# Patient Record
Sex: Male | Born: 1955 | Race: White | Hispanic: No | Smoking: Former smoker
Health system: Southern US, Community
[De-identification: ages and names within clinical notes are randomized; demographics above are authoritative.]

## PROBLEM LIST (undated history)

## (undated) DIAGNOSIS — F419 Anxiety disorder, unspecified: Secondary | ICD-10-CM

## (undated) DIAGNOSIS — R51 Headache: Secondary | ICD-10-CM

## (undated) DIAGNOSIS — C679 Malignant neoplasm of bladder, unspecified: Secondary | ICD-10-CM

## (undated) DIAGNOSIS — G629 Polyneuropathy, unspecified: Secondary | ICD-10-CM

## (undated) DIAGNOSIS — R7303 Prediabetes: Secondary | ICD-10-CM

## (undated) DIAGNOSIS — J302 Other seasonal allergic rhinitis: Secondary | ICD-10-CM

## (undated) DIAGNOSIS — M1990 Unspecified osteoarthritis, unspecified site: Secondary | ICD-10-CM

## (undated) DIAGNOSIS — I10 Essential (primary) hypertension: Secondary | ICD-10-CM

## (undated) DIAGNOSIS — E782 Mixed hyperlipidemia: Secondary | ICD-10-CM

## (undated) DIAGNOSIS — I2510 Atherosclerotic heart disease of native coronary artery without angina pectoris: Secondary | ICD-10-CM

## (undated) DIAGNOSIS — E669 Obesity, unspecified: Secondary | ICD-10-CM

## (undated) DIAGNOSIS — G4730 Sleep apnea, unspecified: Secondary | ICD-10-CM

## (undated) DIAGNOSIS — F32A Depression, unspecified: Secondary | ICD-10-CM

## (undated) DIAGNOSIS — C787 Secondary malignant neoplasm of liver and intrahepatic bile duct: Secondary | ICD-10-CM

## (undated) DIAGNOSIS — R739 Hyperglycemia, unspecified: Secondary | ICD-10-CM

## (undated) HISTORY — PX: JOINT REPLACEMENT: SHX530

## (undated) HISTORY — DX: Atherosclerotic heart disease of native coronary artery without angina pectoris: I25.10

## (undated) HISTORY — DX: Essential (primary) hypertension: I10

## (undated) HISTORY — DX: Polyneuropathy, unspecified: G62.9

---

## 2009-09-11 DIAGNOSIS — C679 Malignant neoplasm of bladder, unspecified: Secondary | ICD-10-CM

## 2009-09-11 HISTORY — DX: Malignant neoplasm of bladder, unspecified: C67.9

## 2009-09-11 HISTORY — PX: OTHER SURGICAL HISTORY: SHX169

## 2010-04-05 ENCOUNTER — Ambulatory Visit (HOSPITAL_COMMUNITY): Admission: RE | Admit: 2010-04-05 | Discharge: 2010-04-05 | Payer: Self-pay | Admitting: Family Medicine

## 2010-04-07 ENCOUNTER — Ambulatory Visit (HOSPITAL_COMMUNITY): Admission: RE | Admit: 2010-04-07 | Discharge: 2010-04-07 | Payer: Self-pay | Admitting: Urology

## 2010-07-13 ENCOUNTER — Ambulatory Visit (HOSPITAL_COMMUNITY): Admission: RE | Admit: 2010-07-13 | Discharge: 2010-07-13 | Payer: Self-pay | Admitting: Family Medicine

## 2010-08-09 ENCOUNTER — Ambulatory Visit (HOSPITAL_COMMUNITY): Admission: RE | Admit: 2010-08-09 | Discharge: 2010-08-09 | Payer: Self-pay | Admitting: Family Medicine

## 2010-08-18 ENCOUNTER — Ambulatory Visit: Payer: Self-pay | Admitting: Internal Medicine

## 2010-11-26 LAB — CBC
MCH: 32.6 pg (ref 26.0–34.0)
Platelets: 288 10*3/uL (ref 150–400)
RBC: 4.54 MIL/uL (ref 4.22–5.81)
RDW: 12.6 % (ref 11.5–15.5)

## 2010-11-26 LAB — BASIC METABOLIC PANEL
BUN: 16 mg/dL (ref 6–23)
CO2: 25 mEq/L (ref 19–32)
Calcium: 9.1 mg/dL (ref 8.4–10.5)
Creatinine, Ser: 0.72 mg/dL (ref 0.4–1.5)
GFR calc Af Amer: >60 mL/min (ref >60)

## 2010-11-26 LAB — SURGICAL PCR SCREEN: MRSA, PCR: NEGATIVE

## 2011-05-18 ENCOUNTER — Encounter (INDEPENDENT_AMBULATORY_CARE_PROVIDER_SITE_OTHER): Payer: Self-pay | Admitting: *Deleted

## 2011-06-27 ENCOUNTER — Other Ambulatory Visit (INDEPENDENT_AMBULATORY_CARE_PROVIDER_SITE_OTHER): Payer: Self-pay | Admitting: *Deleted

## 2011-06-27 DIAGNOSIS — Z1211 Encounter for screening for malignant neoplasm of colon: Secondary | ICD-10-CM

## 2011-06-27 MED ORDER — SODIUM CHLORIDE 0.45 % IV SOLN
Freq: Once | INTRAVENOUS | Status: AC
  Administered 2011-06-28: 11:00:00 via INTRAVENOUS

## 2011-06-28 ENCOUNTER — Other Ambulatory Visit (INDEPENDENT_AMBULATORY_CARE_PROVIDER_SITE_OTHER): Payer: Self-pay | Admitting: Internal Medicine

## 2011-06-28 ENCOUNTER — Ambulatory Visit (HOSPITAL_COMMUNITY)
Admission: RE | Admit: 2011-06-28 | Discharge: 2011-06-28 | Disposition: A | Discharge status: Home / Self Care | Payer: BC Managed Care – PPO | Source: Ambulatory Visit | Attending: Internal Medicine | Admitting: Internal Medicine

## 2011-06-28 ENCOUNTER — Encounter (INDEPENDENT_AMBULATORY_CARE_PROVIDER_SITE_OTHER): Payer: Self-pay | Admitting: Internal Medicine

## 2011-06-28 ENCOUNTER — Encounter (HOSPITAL_COMMUNITY): Payer: Self-pay | Admitting: *Deleted

## 2011-06-28 ENCOUNTER — Encounter (HOSPITAL_COMMUNITY)
Admission: RE | Disposition: A | Discharge status: Home / Self Care | Payer: Self-pay | Source: Ambulatory Visit | Attending: Internal Medicine

## 2011-06-28 DIAGNOSIS — Z7982 Long term (current) use of aspirin: Secondary | ICD-10-CM | POA: Insufficient documentation

## 2011-06-28 DIAGNOSIS — Z79899 Other long term (current) drug therapy: Secondary | ICD-10-CM | POA: Insufficient documentation

## 2011-06-28 DIAGNOSIS — D126 Benign neoplasm of colon, unspecified: Secondary | ICD-10-CM | POA: Insufficient documentation

## 2011-06-28 DIAGNOSIS — Z1211 Encounter for screening for malignant neoplasm of colon: Secondary | ICD-10-CM

## 2011-06-28 DIAGNOSIS — K5730 Diverticulosis of large intestine without perforation or abscess without bleeding: Secondary | ICD-10-CM | POA: Insufficient documentation

## 2011-06-28 DIAGNOSIS — I10 Essential (primary) hypertension: Secondary | ICD-10-CM | POA: Insufficient documentation

## 2011-06-28 HISTORY — DX: Headache: R51

## 2011-06-28 HISTORY — PX: COLONOSCOPY: SHX5424

## 2011-06-28 HISTORY — DX: Anxiety disorder, unspecified: F41.9

## 2011-06-28 HISTORY — DX: Other seasonal allergic rhinitis: J30.2

## 2011-06-28 SURGERY — COLONOSCOPY
Anesthesia: Moderate Sedation

## 2011-06-28 MED ORDER — MIDAZOLAM HCL 5 MG/5ML IJ SOLN
INTRAMUSCULAR | Status: AC
  Filled 2011-06-28: qty 5

## 2011-06-28 MED ORDER — MEPERIDINE HCL 50 MG/ML IJ SOLN
INTRAMUSCULAR | Status: DC | PRN
  Administered 2011-06-28 (×2): 25 mg via INTRAVENOUS

## 2011-06-28 MED ORDER — MIDAZOLAM HCL 5 MG/5ML IJ SOLN
INTRAMUSCULAR | Status: AC
  Filled 2011-06-28: qty 10

## 2011-06-28 MED ORDER — MIDAZOLAM HCL 5 MG/5ML IJ SOLN
INTRAMUSCULAR | Status: DC | PRN
  Administered 2011-06-28 (×4): 2 mg via INTRAVENOUS
  Administered 2011-06-28: 3 mg via INTRAVENOUS

## 2011-06-28 MED ORDER — MEPERIDINE HCL 50 MG/ML IJ SOLN
INTRAMUSCULAR | Status: AC
  Filled 2011-06-28: qty 1

## 2011-06-28 NOTE — Op Note (Signed)
COLONOSCOPY PROCEDURE REPORT  PATIENT:  Marvin Carr  MR#:  [id number] Birthdate:  1956/04/14, 55 y.o., male Endoscopist:  Dr. Malissa Hippo, MD Referred By:  Dr. Ishmael Holter. Renard Matter, Md Procedure Date: 06/28/2011  Procedure:   Colonoscopy  Indications:  Patient is 55 year old Caucasian male who is undergoing average risk screening colonoscopy.  Informed Consent: Procedure and risks reviewed with the patient and informed consent was obtained.  Medications:  Demerol 50 mg IV Versed 11 mg IV  Description of procedure:  After a digital rectal exam was performed, that colonoscope was advanced from the anus through the rectum and colon to the area of the cecum, ileocecal valve and appendiceal orifice. The cecum was deeply intubated. These structures were well-seen and photographed for the record. From the level of the cecum and ileocecal valve, the scope was slowly and cautiously withdrawn. The mucosal surfaces were carefully surveyed utilizing scope tip to flexion to facilitate fold flattening as needed. The scope was pulled down into the rectum where a thorough exam including retroflexion was performed.  Findings:   Prep was satisfactory except fair in the cecum where he had thick stool coating the mucosa. Landmarks were well-seen and picture taken for the record. Multiple diverticula at sigmoid colon with few more scattered through  rest of the colon. 4 mm polyp ablated via cold biopsy from descending colon.  Therapeutic/Diagnostic Maneuvers Performed:  See above  Complications:  None  Cecal Withdrawal Time:  14 minutes  Impression:  Examination performed to cecum. Prep overall was satisfactory but fair in the cecum. Pancolonic diverticulosis. 4 mm polyp ablated via cold biopsy from descending colon.  Recommendations:  High fiber diet. Fiber supplement 3-4 g daily. I will be contacting patient results of biopsy and further recommendations.  REHMAN,NAJEEB U  06/28/2011 11:53  AM  CC: Dr. Alice Reichert, MD & Dr. Bonnetta Barry ref. provider found

## 2011-06-28 NOTE — H&P (Signed)
Marvin Carr is an 55 y.o. male.   Chief Complaint: Patient is here for screening colonoscopy HPI: Patient is 55 year old Caucasian male who is here for average risk screening colonoscopy. This is patient's first exam. He denies abdominal pain, change in his bowel habits or rectal bleeding. He has a good appetite and his weight since it. Family history is negative for colorectal carcinoma.  Past Medical History  Diagnosis Date  . Hypertension   . Seasonal allergies   . Numbness and tingling in left hand     occasional  . Headache     History of migraines  . Anxiety   . Cancer 2011    bladder cancer    Past Surgical History  Procedure Date  . Turbt 2011    History reviewed. No pertinent family history. Social History:  reports that he has quit smoking. He does not have any smokeless tobacco history on file. He reports that he drinks alcohol. He reports that he does not use illicit drugs.  Allergies: No Known Allergies  Medications Prior to Admission  Medication Dose Route Frequency Provider Last Rate Last Dose  . 0.45 % sodium chloride infusion   Intravenous Once Malissa Hippo, MD 20 mL/hr at 06/28/11 1032    . meperidine (DEMEROL) 50 MG/ML injection           . midazolam (VERSED) 5 MG/5ML injection            Medications Prior to Admission  Medication Sig Dispense Refill  . aspirin 81 MG chewable tablet Chew 81 mg by mouth daily. Patient does not take regularly       . clonazePAM (KLONOPIN) 0.5 MG tablet Take 0.5 mg by mouth daily as needed.        Marland Kitchen ibuprofen (ADVIL,MOTRIN) 200 MG tablet Take 400 mg by mouth every 8 (eight) hours as needed. pain       . lisinopril-hydrochlorothiazide (PRINZIDE,ZESTORETIC) 20-12.5 MG per tablet Take 1 tablet by mouth daily. Patient states his BP is pretty much under control so he hasn't taken in about a week       . loratadine (CLARITIN) 10 MG tablet Take 10 mg by mouth daily as needed. allergies       . meloxicam (MOBIC) 15 MG tablet  Take 15 mg by mouth daily as needed.        . Multiple Vitamins-Minerals (MULTIVITAMINS THER. W/MINERALS) TABS Take 1 tablet by mouth daily.        . pramipexole (MIRAPEX) 0.5 MG tablet Take 0.5 mg by mouth 2 (two) times daily.        . traMADol (ULTRAM) 50 MG tablet Take 50 mg by mouth every 4 (four) hours as needed. Back pain       . zolpidem (AMBIEN) 10 MG tablet Take 10 mg by mouth at bedtime as needed.        . fish oil-omega-3 fatty acids 1000 MG capsule Take 2 g by mouth daily.          No results found for this or any previous visit (from the past 48 hour(s)). No results found.  Review of Systems  Constitutional: Negative for weight loss.  Gastrointestinal: Negative for abdominal pain, diarrhea, constipation, blood in stool and melena.    Blood pressure 149/97, pulse 75, temperature 98 F (36.7 C), temperature source Oral, resp. rate 24, height 6' (1.829 m), weight 225 lb (102.059 kg), SpO2 96.00%. Physical Exam  Constitutional: He appears well-developed and well-nourished.  HENT:  Mouth/Throat: Oropharynx is clear and moist.  Eyes: Conjunctivae are normal. No scleral icterus.  Neck: No thyromegaly present.  Cardiovascular: Normal rate and normal heart sounds.   No murmur heard. Respiratory: Effort normal and breath sounds normal.  GI: He exhibits no distension and no mass. There is no tenderness.  Musculoskeletal: He exhibits no edema.  Lymphadenopathy:    He has no cervical adenopathy.  Neurological: He is alert.  Skin: Skin is warm and dry.     Assessment/Plan Average risk screening colonoscopy  Marvin Carr U 06/28/2011, 11:22 AM

## 2011-07-03 ENCOUNTER — Encounter (INDEPENDENT_AMBULATORY_CARE_PROVIDER_SITE_OTHER): Payer: Self-pay | Admitting: *Deleted

## 2011-07-06 ENCOUNTER — Encounter (HOSPITAL_COMMUNITY): Payer: Self-pay | Admitting: Internal Medicine

## 2011-12-05 ENCOUNTER — Ambulatory Visit (HOSPITAL_COMMUNITY)
Admission: RE | Admit: 2011-12-05 | Discharge: 2011-12-05 | Disposition: A | Discharge status: Home / Self Care | Payer: BC Managed Care – PPO | Source: Ambulatory Visit | Attending: Family Medicine | Admitting: Family Medicine

## 2011-12-05 ENCOUNTER — Other Ambulatory Visit (HOSPITAL_COMMUNITY): Payer: Self-pay | Admitting: Family Medicine

## 2011-12-05 DIAGNOSIS — R059 Cough, unspecified: Secondary | ICD-10-CM | POA: Insufficient documentation

## 2011-12-05 DIAGNOSIS — R05 Cough: Secondary | ICD-10-CM

## 2013-01-15 ENCOUNTER — Ambulatory Visit: Payer: BC Managed Care – PPO | Attending: Neurology | Admitting: Sleep Medicine

## 2013-01-15 VITALS — Ht 72.0 in | Wt 245.0 lb

## 2013-01-15 DIAGNOSIS — G4700 Insomnia, unspecified: Secondary | ICD-10-CM

## 2013-01-15 DIAGNOSIS — G4733 Obstructive sleep apnea (adult) (pediatric): Secondary | ICD-10-CM | POA: Insufficient documentation

## 2013-01-15 DIAGNOSIS — Z6833 Body mass index (BMI) 33.0-33.9, adult: Secondary | ICD-10-CM | POA: Insufficient documentation

## 2013-01-20 NOTE — Procedures (Signed)
[facility] Shawndrea Rutkowski A. Gerilyn Pilgrim, MD     [email or web address]        NAME:  Marvin Carr, Marvin Carr             ACCOUNT NO.:  [id number]  MEDICAL RECORD NO.:  [id number]          PATIENT TYPE:  OUT  LOCATION:  SLEEP LAB                     FACILITY:  [facility]  PHYSICIAN:  Lillion Elbert A. Gerilyn Pilgrim, M.D. DATE OF BIRTH:  [birth year]  DATE OF STUDY:  01/15/2013                           NOCTURNAL POLYSOMNOGRAM  REFERRING PHYSICIAN:  David Towson A. Gerilyn Pilgrim, M.D.  INDICATIONS:  A 57 year old who presents with fatigue, hypersomnia, and snoring.  MEDICATIONS:  Ambien and pramipexole.  EPWORTH SLEEPINESS SCALE:  19  BMI 33.  ARCHITECTURAL SUMMARY:  The total recording time is 370 minutes.  Sleep efficiency is 64%.  Sleep latency is 14 minutes.  REM latency is 144 minutes.  Stage N1 22%, N2 61%, N3 0%, and REM sleep 80%.  RESPIRATORY SUMMARY:  Baseline oxygen saturation is 95, lowest saturation 87.  During non-REM sleep, diagnostic AHI 17.  LIMB MOVEMENT SUMMARY:  PLM index 0.  ELECTROCARDIOGRAM SUMMARY:  Average heart rate is 73 with no significant dysrhythmias observed.  IMPRESSION:  Moderate obstructive sleep apnea syndrome.  RECOMMENDATION:  The patient should be set up for formal titration study.     Sakoya Win A. Gerilyn Pilgrim, M.D.    KAD/MEDQ  D:  01/20/2013 09:27:46  T:  01/20/2013 09:48:18  Job:  [id number]

## 2013-01-28 ENCOUNTER — Other Ambulatory Visit: Payer: Self-pay

## 2013-01-28 DIAGNOSIS — G4730 Sleep apnea, unspecified: Secondary | ICD-10-CM

## 2013-01-28 DIAGNOSIS — G4710 Hypersomnia, unspecified: Secondary | ICD-10-CM

## 2013-01-30 ENCOUNTER — Ambulatory Visit: Payer: BC Managed Care – PPO | Attending: Neurology | Admitting: Sleep Medicine

## 2013-01-30 DIAGNOSIS — G4733 Obstructive sleep apnea (adult) (pediatric): Secondary | ICD-10-CM | POA: Insufficient documentation

## 2013-01-30 DIAGNOSIS — G4730 Sleep apnea, unspecified: Secondary | ICD-10-CM

## 2013-02-06 NOTE — Procedures (Signed)
[facility] Marvin Castille A. Gerilyn Pilgrim, MD     [email or web address]        NAME:  Marvin Carr, Marvin Carr             ACCOUNT NO.:  [id number]  MEDICAL RECORD NO.:  [id number]          PATIENT TYPE:  OUT  LOCATION:  SLEEP LAB                     FACILITY:  [facility]  PHYSICIAN:  Stephens Shreve A. Gerilyn Carr, M.D. DATE OF BIRTH:  11-27-1955  DATE OF STUDY:  01/30/2013                           NOCTURNAL POLYSOMNOGRAM  REFERRING PHYSICIAN:  Xochilth Standish A. Gerilyn Carr, M.D.  INDICATION FOR STUDY:  A 57 year old who has a known history of obstructive sleep apnea syndrome.  This is a CPAP titration recording.  EPWORTH SLEEPINESS SCORE:  19.  BMI 33.  MEDICATIONS:  Pramipexole and zolpidem.  SLEEP ARCHITECTURE:  The total recording time is 397 minutes.  Sleep efficiency 54%, sleep latency 42 minutes.  REM latency 150 minutes. Stage N1 16%, N2 73%, N3 0%, and REM sleep 11%.  RESPIRATORY DATA:  Baseline oxygen saturation is 98, lowest saturation 89 during non-REM sleep.  The patient was placed on positive pressures, starting at 5 and increased to a pressure of 10.  He tolerated pressure 10 well with resolution of obstructive events.  CARDIAC DATA:  Average heart rate is 72 with no significant dysrhythmias observed.  MOVEMENT-PARASOMNIA:  PLM index 0.  IMPRESSIONS-RECOMMENDATIONS:  Obstructive sleep apnea syndrome which responds well to a CPAP of 10.     Annalynne Ibanez A. Gerilyn Carr, M.D.    KAD/MEDQ  D:  02/06/2013 09:58:07  T:  02/06/2013 10:15:15  Job:  [id number]

## 2013-02-24 ENCOUNTER — Emergency Department (HOSPITAL_COMMUNITY): Payer: BC Managed Care – PPO

## 2013-02-24 ENCOUNTER — Emergency Department (HOSPITAL_COMMUNITY)
Admission: EM | Admit: 2013-02-24 | Discharge: 2013-02-24 | Disposition: A | Discharge status: Home / Self Care | Payer: BC Managed Care – PPO | Attending: Emergency Medicine | Admitting: Emergency Medicine

## 2013-02-24 ENCOUNTER — Encounter (HOSPITAL_COMMUNITY): Payer: Self-pay | Admitting: *Deleted

## 2013-02-24 DIAGNOSIS — R0602 Shortness of breath: Secondary | ICD-10-CM | POA: Insufficient documentation

## 2013-02-24 DIAGNOSIS — F411 Generalized anxiety disorder: Secondary | ICD-10-CM | POA: Insufficient documentation

## 2013-02-24 DIAGNOSIS — R110 Nausea: Secondary | ICD-10-CM | POA: Insufficient documentation

## 2013-02-24 DIAGNOSIS — R509 Fever, unspecified: Secondary | ICD-10-CM | POA: Insufficient documentation

## 2013-02-24 DIAGNOSIS — R61 Generalized hyperhidrosis: Secondary | ICD-10-CM | POA: Insufficient documentation

## 2013-02-24 DIAGNOSIS — Z79899 Other long term (current) drug therapy: Secondary | ICD-10-CM | POA: Insufficient documentation

## 2013-02-24 DIAGNOSIS — R51 Headache: Secondary | ICD-10-CM | POA: Insufficient documentation

## 2013-02-24 DIAGNOSIS — M545 Low back pain, unspecified: Secondary | ICD-10-CM | POA: Insufficient documentation

## 2013-02-24 DIAGNOSIS — Z872 Personal history of diseases of the skin and subcutaneous tissue: Secondary | ICD-10-CM | POA: Insufficient documentation

## 2013-02-24 DIAGNOSIS — R079 Chest pain, unspecified: Secondary | ICD-10-CM | POA: Insufficient documentation

## 2013-02-24 DIAGNOSIS — R42 Dizziness and giddiness: Secondary | ICD-10-CM | POA: Insufficient documentation

## 2013-02-24 DIAGNOSIS — Z8551 Personal history of malignant neoplasm of bladder: Secondary | ICD-10-CM | POA: Insufficient documentation

## 2013-02-24 DIAGNOSIS — I10 Essential (primary) hypertension: Secondary | ICD-10-CM | POA: Insufficient documentation

## 2013-02-24 DIAGNOSIS — G43909 Migraine, unspecified, not intractable, without status migrainosus: Secondary | ICD-10-CM | POA: Insufficient documentation

## 2013-02-24 DIAGNOSIS — Z87891 Personal history of nicotine dependence: Secondary | ICD-10-CM | POA: Insufficient documentation

## 2013-02-24 LAB — HEPATIC FUNCTION PANEL
ALT: 29 U/L (ref 0–53)
Albumin: 3.9 g/dL (ref 3.5–5.2)
Alkaline Phosphatase: 73 U/L (ref 39–117)
Indirect Bilirubin: 0.3 mg/dL (ref 0.3–0.9)
Total Bilirubin: 0.4 mg/dL (ref 0.3–1.2)
Total Protein: 6.6 g/dL (ref 6.0–8.3)

## 2013-02-24 LAB — BASIC METABOLIC PANEL
BUN: 19 mg/dL (ref 6–23)
CO2: 24 mEq/L (ref 19–32)
Calcium: 9.6 mg/dL (ref 8.4–10.5)
GFR calc non Af Amer: >90 mL/min (ref >90)
Glucose, Bld: 116 mg/dL — ABNORMAL HIGH (ref 70–99)

## 2013-02-24 LAB — CBC
HCT: 45.8 % (ref 39.0–52.0)
Hemoglobin: 16.4 g/dL (ref 13.0–17.0)
MCH: 31.2 pg (ref 26.0–34.0)
MCHC: 35.8 g/dL (ref 30.0–36.0)
MCV: 87.2 fL (ref 78.0–100.0)
RBC: 5.25 MIL/uL (ref 4.22–5.81)

## 2013-02-24 MED ORDER — ASPIRIN 81 MG PO CHEW
324.0000 mg | CHEWABLE_TABLET | Freq: Once | ORAL | Status: AC
  Administered 2013-02-24: 324 mg via ORAL
  Filled 2013-02-24: qty 4

## 2013-02-24 MED ORDER — ONDANSETRON 4 MG PO TBDP: 4.0000 mg | ORAL_TABLET | Freq: Once | ORAL | Status: DC

## 2013-02-24 MED ORDER — ONDANSETRON 4 MG PO TBDP
ORAL_TABLET | ORAL | Status: AC
  Filled 2013-02-24: qty 1

## 2013-02-24 MED ORDER — SODIUM CHLORIDE 0.9 % IV BOLUS (SEPSIS)
250.0000 mL | Freq: Once | INTRAVENOUS | Status: AC
  Administered 2013-02-24: 250 mL via INTRAVENOUS

## 2013-02-24 MED ORDER — MECLIZINE HCL 12.5 MG PO TABS
25.0000 mg | ORAL_TABLET | Freq: Once | ORAL | Status: AC
  Administered 2013-02-24: 25 mg via ORAL
  Filled 2013-02-24: qty 2

## 2013-02-24 MED ORDER — SODIUM CHLORIDE 0.9 % IV SOLN: INTRAVENOUS | Status: DC

## 2013-02-24 MED ORDER — NITROGLYCERIN 0.4 MG SL SUBL
0.4000 mg | SUBLINGUAL_TABLET | SUBLINGUAL | Status: DC | PRN
  Administered 2013-02-24: 0.4 mg via SUBLINGUAL
  Filled 2013-02-24: qty 25

## 2013-02-24 MED ORDER — MECLIZINE HCL 25 MG PO TABS: 25.0000 mg | ORAL_TABLET | Freq: Three times a day (TID) | ORAL | Status: DC | PRN

## 2013-02-24 NOTE — ED Notes (Signed)
Pt states NTG did not help

## 2013-02-24 NOTE — ED Notes (Signed)
Pt states dizziness and SOB began when he woke up this morning. NAD. Pt was recently put on CPAP at night. Last night was his third night using it. NAD.

## 2013-02-24 NOTE — ED Notes (Signed)
Pt waiting in wheelchair for family member to bring car around. Pt actively vomiting. EDP aware and zofran ordered

## 2013-02-24 NOTE — ED Provider Notes (Signed)
History     This chart was scribed for Shelda Jakes, MD, MD by Smitty Pluck, ED Scribe. The patient was seen in room [facility] and the patient's care was started at 1:41 PM.   CSN: [id number]  Arrival date & time 02/24/13  1224       Chief Complaint  Patient presents with  . Shortness of Breath  . Dizziness    Patient is a 57 y.o. male presenting with shortness of breath and chest pain. The history is provided by the patient and medical records. No language interpreter was used.  Shortness of Breath Associated symptoms: chest pain, diaphoresis, fever and headaches   Associated symptoms: no cough, no neck pain and no rash   Chest Pain Pain location:  Substernal area Pain radiates to:  L jaw and R jaw Pain radiates to the back: no   Pain severity:  Moderate Onset quality:  Sudden Duration:  6 hours Timing:  Constant Progression:  Unchanged Chronicity:  New Relieved by:  Nothing Worsened by:  Nothing tried Ineffective treatments:  None tried Associated symptoms: back pain, diaphoresis, dizziness, fever, headache, nausea and shortness of breath   Associated symptoms: no cough    HPI Comments: Marvin Carr is a 57 y.o. male who presents to the Emergency Department complaining of constant, moderate dizziness and SOB onset today upon waking up at 8AM. He states that when he went to sleep he felt normal. He states he feels the room is spinning. He denies hx of similar symptoms. He reports having nausea, fever (ED temperature is 97.2)diaphoresis and sudden, constant, dull upper chest pain radiating intermittent to bilateral jaws onset at 8AM. He states the chest pain is 7/10. He denies hx of cardiac problems. He states he has HA located at temporal area. He states he usually awakes with HA (ongoing) but the current one feels different. He mentions he has lower back pain. Pt denies fall, head injury, chills, vomiting, diarrhea, weakness, cough and any other pain. Pt uses CPAP for  sleep apnea.   PCP is Dr. Margo Aye  Past Medical History  Diagnosis Date  . Hypertension   . Seasonal allergies   . Numbness and tingling in left hand     occasional  . Headache(784.0)     History of migraines  . Anxiety   . Cancer 2011    bladder cancer    Past Surgical History  Procedure Laterality Date  . Turbt  2011  . Colonoscopy  06/28/2011    Procedure: COLONOSCOPY;  Surgeon: Malissa Hippo, MD;  Location: [facility];  Service: Endoscopy;  Laterality: N/A;    No family history on file.  History  Substance Use Topics  . Smoking status: Former Smoker -- 0.50 packs/day for 10 years  . Smokeless tobacco: Not on file  . Alcohol Use: Yes     Comment: occasional      Review of Systems  Constitutional: Positive for fever and diaphoresis. Negative for chills.  HENT: Negative for congestion, rhinorrhea and neck pain.   Eyes: Negative for visual disturbance.  Respiratory: Positive for shortness of breath. Negative for cough.   Cardiovascular: Positive for chest pain. Negative for leg swelling.  Gastrointestinal: Positive for nausea.  Genitourinary: Negative for dysuria and hematuria.  Musculoskeletal: Positive for back pain.  Skin: Negative for rash.  Neurological: Positive for dizziness and headaches.  Hematological: Does not bruise/bleed easily.  Psychiatric/Behavioral: Negative for confusion.  All other systems reviewed and are negative.  Allergies  Review of patient's allergies indicates no known allergies.  Home Medications   Current Outpatient Rx  Name  Route  Sig  Dispense  Refill  . buPROPion (WELLBUTRIN XL) 300 MG 24 hr tablet   Oral   Take 300 mg by mouth daily.         Marland Kitchen lisinopril-hydrochlorothiazide (PRINZIDE,ZESTORETIC) 20-12.5 MG per tablet   Oral   Take 1 tablet by mouth daily.          Marland Kitchen loratadine (CLARITIN) 10 MG tablet   Oral   Take 10 mg by mouth daily as needed for allergies. allergies         . meloxicam (MOBIC) 15 MG  tablet   Oral   Take 15 mg by mouth daily.          . naproxen sodium (ALEVE) 220 MG tablet   Oral   Take 220 mg by mouth 2 (two) times daily as needed (Pain).         . pramipexole (MIRAPEX) 0.5 MG tablet   Oral   Take 0.5 mg by mouth 2 (two) times daily.           Marland Kitchen zolpidem (AMBIEN) 10 MG tablet   Oral   Take 10 mg by mouth at bedtime.          . meclizine (ANTIVERT) 25 MG tablet   Oral   Take 1 tablet (25 mg total) by mouth 3 (three) times daily as needed.   30 tablet   0     BP 123/80  Pulse 69  Temp(Src) 97.2 F (36.2 C) (Oral)  Resp 22  Ht 6' (1.829 m)  Wt 245 lb (111.131 kg)  BMI 33.22 kg/m2  SpO2 100%  Physical Exam  Nursing note and vitals reviewed. Constitutional: He is oriented to person, place, and time. He appears well-developed and well-nourished. No distress.  HENT:  Head: Normocephalic and atraumatic.  Eyes: EOM are normal. Pupils are equal, round, and reactive to light.  Tracking well Sclera are clear   Neck: Normal range of motion. Neck supple. No tracheal deviation present.  Cardiovascular: Normal rate, regular rhythm and normal heart sounds.   No murmur heard. Pulmonary/Chest: Effort normal and breath sounds normal. No respiratory distress. He has no wheezes. He has no rales.  O2 sat on room air during exam was 97%  Abdominal: Soft. He exhibits no distension.  Musculoskeletal: Normal range of motion.  Neurological: He is alert and oriented to person, place, and time.  Skin: Skin is warm and dry.  Psychiatric: He has a normal mood and affect. His behavior is normal.    ED Course  Procedures (including critical care time) DIAGNOSTIC STUDIES: Oxygen Saturation is 100% on room air, normal by my interpretation.    COORDINATION OF CARE: 1:50 PM Discussed ED treatment with pt and pt agrees.  Medications  0.9 %  sodium chloride infusion ( Intravenous Restarted 02/24/13 1437)  nitroGLYCERIN (NITROSTAT) SL tablet 0.4 mg (0.4 mg  Sublingual Given 02/24/13 1415)  sodium chloride 0.9 % bolus 250 mL (0 mLs Intravenous Stopped 02/24/13 1437)  aspirin chewable tablet 324 mg (324 mg Oral Given 02/24/13 1414)  meclizine (ANTIVERT) tablet 25 mg (25 mg Oral Given 02/24/13 1548)      Labs Reviewed  BASIC METABOLIC PANEL - Abnormal; Notable for the following:    Glucose, Bld 116 (*)    All other components within normal limits  CBC  TROPONIN I  PRO B NATRIURETIC PEPTIDE  HEPATIC FUNCTION PANEL  TROPONIN I   Ct Head Wo Contrast  02/24/2013   *RADIOLOGY REPORT*  Clinical Data: Short of breath and dizziness.  Bladder cancer.  CT HEAD WITHOUT CONTRAST  Technique:  Contiguous axial images were obtained from the base of the skull through the vertex without contrast.  Comparison: None  Findings: Ventricle size is normal.  Negative for acute infarct. Negative for hemorrhage or mass.  No edema.  Calvarium intact. Retention cyst right maxillary sinus.  No focal bony lesion.  IMPRESSION: No acute abnormality.   Original Report Authenticated By: Janeece Riggers, M.D.   Dg Chest Port 1 View  02/24/2013   *RADIOLOGY REPORT*  Clinical Data: Chest pain and shortness of breath.  PORTABLE CHEST - 1 VIEW  Comparison: 12/05/2011.  Findings: The heart remains normal in size and the lungs are clear with normal vascularity.  Normal appearing bones.  IMPRESSION: Normal examination.   Original Report Authenticated By: Beckie Salts, M.D.    Date: 02/24/2013  Rate: 67  Rhythm: normal sinus rhythm  QRS Axis: normal  Intervals: normal  ST/T Wave abnormalities: normal  Conduction Disutrbances:none  Narrative Interpretation:   Old EKG Reviewed: unchanged From 04/07/10   1. Vertigo   2. Chest pain       MDM  Workup here today was negative for any head abnormalities to explain the vertigo. Chest pain we know is mild troponin markers x2 negative since she had pain since 8:00 this morning is unlikely to be unstable angina or acute cardiac event. EKG had  no acute changes as well. Chest x-ray was negative. We'll treat for the vertigo recommend followup with her primary care Dr. in the next few days return for any newer worse symptoms. X-ray showed no evidence of pneumonia or pneumothorax. Clinically do not feel that the vertigo is related to a stroke. Also clinically did not feel that this is related to unstable angina.      I personally performed the services described in this documentation, which was scribed in my presence. The recorded information has been reviewed and is accurate.     Shelda Jakes, MD 02/24/13 [phone]

## 2013-03-28 ENCOUNTER — Encounter (HOSPITAL_COMMUNITY): Payer: Self-pay | Admitting: *Deleted

## 2013-03-28 ENCOUNTER — Inpatient Hospital Stay (HOSPITAL_COMMUNITY)
Admission: EM | Admit: 2013-03-28 | Discharge: 2013-03-31 | DRG: 122 | Disposition: A | Discharge status: Home / Self Care | Payer: BC Managed Care – PPO | Attending: Internal Medicine | Admitting: Internal Medicine

## 2013-03-28 ENCOUNTER — Emergency Department (HOSPITAL_COMMUNITY): Payer: BC Managed Care – PPO

## 2013-03-28 DIAGNOSIS — I2510 Atherosclerotic heart disease of native coronary artery without angina pectoris: Secondary | ICD-10-CM | POA: Diagnosis present

## 2013-03-28 DIAGNOSIS — F3289 Other specified depressive episodes: Secondary | ICD-10-CM | POA: Diagnosis present

## 2013-03-28 DIAGNOSIS — R209 Unspecified disturbances of skin sensation: Secondary | ICD-10-CM | POA: Diagnosis present

## 2013-03-28 DIAGNOSIS — G4730 Sleep apnea, unspecified: Secondary | ICD-10-CM | POA: Diagnosis present

## 2013-03-28 DIAGNOSIS — Z8551 Personal history of malignant neoplasm of bladder: Secondary | ICD-10-CM

## 2013-03-28 DIAGNOSIS — F411 Generalized anxiety disorder: Secondary | ICD-10-CM | POA: Diagnosis present

## 2013-03-28 DIAGNOSIS — Z6834 Body mass index (BMI) 34.0-34.9, adult: Secondary | ICD-10-CM

## 2013-03-28 DIAGNOSIS — F329 Major depressive disorder, single episode, unspecified: Secondary | ICD-10-CM | POA: Diagnosis present

## 2013-03-28 DIAGNOSIS — I10 Essential (primary) hypertension: Secondary | ICD-10-CM | POA: Diagnosis present

## 2013-03-28 DIAGNOSIS — R7309 Other abnormal glucose: Secondary | ICD-10-CM | POA: Diagnosis present

## 2013-03-28 DIAGNOSIS — G2581 Restless legs syndrome: Secondary | ICD-10-CM | POA: Diagnosis present

## 2013-03-28 DIAGNOSIS — E782 Mixed hyperlipidemia: Secondary | ICD-10-CM | POA: Diagnosis present

## 2013-03-28 DIAGNOSIS — E669 Obesity, unspecified: Secondary | ICD-10-CM | POA: Diagnosis present

## 2013-03-28 DIAGNOSIS — I214 Non-ST elevation (NSTEMI) myocardial infarction: Secondary | ICD-10-CM

## 2013-03-28 HISTORY — DX: Mixed hyperlipidemia: E78.2

## 2013-03-28 HISTORY — DX: Sleep apnea, unspecified: G47.30

## 2013-03-28 HISTORY — DX: Obesity, unspecified: E66.9

## 2013-03-28 HISTORY — DX: Malignant neoplasm of bladder, unspecified: C67.9

## 2013-03-28 HISTORY — DX: Hyperglycemia, unspecified: R73.9

## 2013-03-28 LAB — CBC
HCT: 43.5 % (ref 39.0–52.0)
Hemoglobin: 15.6 g/dL (ref 13.0–17.0)
MCH: 31.6 pg (ref 26.0–34.0)
MCHC: 35.9 g/dL (ref 30.0–36.0)
RDW: 12.4 % (ref 11.5–15.5)

## 2013-03-28 LAB — BASIC METABOLIC PANEL
BUN: 17 mg/dL (ref 6–23)
Calcium: 9.3 mg/dL (ref 8.4–10.5)
GFR calc Af Amer: >90 mL/min (ref >90)
GFR calc non Af Amer: >90 mL/min (ref >90)
Glucose, Bld: 111 mg/dL — ABNORMAL HIGH (ref 70–99)

## 2013-03-28 MED ORDER — ASPIRIN 81 MG PO CHEW
81.0000 mg | CHEWABLE_TABLET | Freq: Once | ORAL | Status: AC
  Administered 2013-03-28: 81 mg via ORAL
  Filled 2013-03-28: qty 1

## 2013-03-28 MED ORDER — MORPHINE SULFATE 2 MG/ML IJ SOLN
2.0000 mg | Freq: Once | INTRAMUSCULAR | Status: AC
  Administered 2013-03-28: 2 mg via INTRAVENOUS
  Filled 2013-03-28: qty 1

## 2013-03-28 MED ORDER — ONDANSETRON HCL 4 MG/2ML IJ SOLN
4.0000 mg | Freq: Once | INTRAMUSCULAR | Status: AC
  Administered 2013-03-28: 4 mg via INTRAVENOUS
  Filled 2013-03-28: qty 2

## 2013-03-28 MED ORDER — NITROGLYCERIN 0.4 MG SL SUBL
0.4000 mg | SUBLINGUAL_TABLET | SUBLINGUAL | Status: DC | PRN
  Administered 2013-03-28 (×2): 0.4 mg via SUBLINGUAL
  Filled 2013-03-28: qty 25

## 2013-03-28 MED ORDER — ACETAMINOPHEN 500 MG PO TABS
1000.0000 mg | ORAL_TABLET | Freq: Once | ORAL | Status: AC
  Administered 2013-03-28: 1000 mg via ORAL
  Filled 2013-03-28: qty 2

## 2013-03-28 MED ORDER — NITROGLYCERIN 2 % TD OINT
0.5000 [in_us] | TOPICAL_OINTMENT | Freq: Once | TRANSDERMAL | Status: AC
  Administered 2013-03-29: 0.5 [in_us] via TOPICAL
  Filled 2013-03-28: qty 1

## 2013-03-28 MED ORDER — SODIUM CHLORIDE 0.9 % IV BOLUS (SEPSIS)
250.0000 mL | Freq: Once | INTRAVENOUS | Status: AC
  Administered 2013-03-28: 250 mL via INTRAVENOUS

## 2013-03-28 NOTE — ED Notes (Signed)
Pt c/o sharp,  radiating, centralized chest pain that started around 9pm. Pt took 3 81mg  aspirin pta. Pt reports nausea and lightheadedness.

## 2013-03-28 NOTE — ED Provider Notes (Signed)
History    This chart was scribed for Joya Gaskins, MD, by Frederik Pear, ED scribe. The patient was seen in room APA05/AP[facility]05 and the patient's care was started at 2305.   CSN: [id number] Arrival date & time 03/28/13  2204  First MD Initiated Contact with Patient 03/28/13 2305     Chief Complaint  Patient presents with  . Chest Pain   Patient is a 57 y.o. male presenting with chest pain. The history is provided by the patient and medical records. No language interpreter was used.  Chest Pain Chest pain location: central. Pain quality: pressure   Pain radiates to:  Does not radiate Pain radiates to the back: no   Pain severity:  Mild Onset quality:  Sudden Duration:  2 hours Timing:  Constant Progression:  Unchanged Chronicity:  Recurrent Context: at rest   Relieved by:  Nothing Worsened by:  Nothing tried Associated symptoms: abdominal pain and nausea   Associated symptoms: no back pain, no cough, no dizziness, no fatigue, no fever, no headache, no shortness of breath, not vomiting and no weakness    HPI Comments: Marvin Carr is a 57 y.o. male who presents to the Emergency Department complaining of sudden onset, worsening, 2-3/10 central CP that feels like pressure and has been constant since it began at 2100 while watching television. He also complain of mild nonspecific abdominal pain and nausea. Denies pain with deep breaths, fever, chills, cough, emesis, diarrhea, hematochezia, SOB, dizziness, or syncope. He reports a h/o of similar pain last week that lasted for 30 minutes before resolving. He was not seen for that pain. He reports he took 3 baby aspirin at home. In ED, he has received 3 nitro, which have provided moderate relief of the pain. He has no h/o of previous CVAs or MIs. He has a family h/o of cardiac disease from the paternal side of his family. He is not currently followed by a cardiologist. He reports he recently began CPAP for sleep apnea.  PCP is Dr.  Margo Aye.  Past Medical History  Diagnosis Date  . Hypertension   . Seasonal allergies   . Numbness and tingling in left hand     occasional  . Headache(784.0)     History of migraines  . Anxiety   . Cancer 2011    bladder cancer   Past Surgical History  Procedure Laterality Date  . Turbt  2011  . Colonoscopy  06/28/2011    Procedure: COLONOSCOPY;  Surgeon: Malissa Hippo, MD;  Location: [facility];  Service: Endoscopy;  Laterality: N/A;   History reviewed. No pertinent family history. History  Substance Use Topics  . Smoking status: Former Smoker -- 0.50 packs/day for 10 years  . Smokeless tobacco: Not on file  . Alcohol Use: Yes     Comment: occasional    Review of Systems  Constitutional: Negative for fever, chills, appetite change and fatigue.       Per HPI, otherwise negative  HENT: Negative for congestion, sinus pressure and ear discharge.        Per HPI, otherwise negative  Eyes: Negative for discharge.  Respiratory: Negative for cough and shortness of breath.        Per HPI, otherwise negative  Cardiovascular: Positive for chest pain.       Per HPI, otherwise negative  Gastrointestinal: Positive for nausea and abdominal pain. Negative for vomiting and diarrhea.  Endocrine:       Negative aside from HPI  Genitourinary: Negative for frequency and hematuria.       Neg aside from HPI   Musculoskeletal: Negative for back pain.       Per HPI, otherwise negative  Skin: Negative.   Neurological: Negative for dizziness, seizures, syncope, weakness and headaches.  Psychiatric/Behavioral: Negative for hallucinations.  All other systems reviewed and are negative.    Allergies  Review of patient's allergies indicates no known allergies.  Home Medications   Current Outpatient Rx  Name  Route  Sig  Dispense  Refill  . aspirin EC 81 MG tablet   Oral   Take 243 mg by mouth once.         Marland Kitchen buPROPion (WELLBUTRIN XL) 300 MG 24 hr tablet   Oral   Take 300 mg by  mouth daily.         . cephALEXin (KEFLEX) 500 MG capsule   Oral   Take 500 mg by mouth 3 (three) times daily. 10 day course for dental procedure. Patient has some medication remaining         . lisinopril-hydrochlorothiazide (PRINZIDE,ZESTORETIC) 20-12.5 MG per tablet   Oral   Take 1 tablet by mouth daily.          Marland Kitchen loratadine (CLARITIN) 10 MG tablet   Oral   Take 10 mg by mouth daily as needed for allergies. allergies         . meloxicam (MOBIC) 15 MG tablet   Oral   Take 15 mg by mouth daily.          . naproxen sodium (ALEVE) 220 MG tablet   Oral   Take 220 mg by mouth 2 (two) times daily as needed (Pain).         . phenylephrine (SUDAFED PE) 10 MG TABS   Oral   Take 10 mg by mouth every 4 (four) hours as needed (for allergies/sinus).         . pramipexole (MIRAPEX) 0.5 MG tablet   Oral   Take 0.5 mg by mouth 2 (two) times daily.           . pseudoephedrine (SUDAFED) 120 MG 12 hr tablet   Oral   Take 120 mg by mouth every 12 (twelve) hours.         Marland Kitchen zolpidem (AMBIEN) 10 MG tablet   Oral   Take 10 mg by mouth at bedtime.           BP 119/75  Pulse 79  Temp(Src) 97.4 F (36.3 C) (Oral)  Resp 18  SpO2 94% Physical Exam CONSTITUTIONAL: Well developed/well nourished HEAD: Normocephalic/atraumatic EYES: EOMI/PERRL ENMT: Mucous membranes moist NECK: supple no meningeal signs SPINE:entire spine nontender CV: S1/S2 noted, no murmurs/rubs/gallops noted LUNGS: Lungs are clear to auscultation bilaterally, no apparent distress ABDOMEN: soft, nontender, no rebound or guarding GU:no cva tenderness NEURO: Pt is awake/alert, moves all extremitiesx4 EXTREMITIES: pulses normal, full ROM SKIN: warm, color normal PSYCH: no abnormalities of mood noted ED Course  Procedures  CRITICAL CARE Performed by: Joya Gaskins Total critical care time: 32 Critical care time was exclusive of separately billable procedures and treating other  patients. Critical care was necessary to treat or prevent imminent or life-threatening deterioration. Critical care was time spent personally by me on the following activities: development of treatment plan with patient and/or surrogate as well as nursing, discussions with consultants, evaluation of patient's response to treatment, examination of patient, obtaining history from patient or surrogate, ordering and performing treatments and interventions, ordering  and review of laboratory studies, ordering and review of radiographic studies, pulse oximetry and re-evaluation of patient's condition.   DIAGNOSTIC STUDIES: Oxygen Saturation is 94% on room air, normal by my interpretation.    COORDINATION OF CARE:  23:25-  Discussed positive troponin results from blood work ordered at triage and admitting him at [facility]. Ordered aspirin, morphine, and zofran. The pt is agreeable at this time.   11:50 PM I spoke to cardiology dr whitlock at [facility] We discussed case Pt is improving and stable at this time Will place on tele bed at this time Pt reports pain is 2/10 at this time.  Will give NTG paste. He was given another ASA (had 3 baby ASA at home) Heparin was ordered NTG was given to control pain Labs Reviewed  CBC  BASIC METABOLIC PANEL  TROPONIN I   Dg Chest Port 1 View  03/28/2013   *RADIOLOGY REPORT*  Clinical Data: Chest pain  PORTABLE CHEST - 1 VIEW  Comparison: 02/24/2013  Findings: Cardiac leads overlie the chest.  Heart size is normal. Lung volumes are low but clear.  No pleural effusion.  No acute osseous finding. Lung volumes are low with crowding of the bronchovascular markings.  IMPRESSION: Allowing for low lung volumes and crowding of the bronchovascular markings, no focal acute finding.   Original Report Authenticated By: Christiana Pellant, M.D.   MDM  Nursing notes including past medical history and social history reviewed and considered in documentation xrays reviewed and  considered Labs/vital reviewed and considered    Date: 03/28/2013 2205  Rate: 75  Rhythm: normal sinus rhythm  QRS Axis: normal  Intervals: normal  ST/T Wave abnormalities: normal  Conduction Disutrbances:none  Narrative Interpretation:   Old EKG Reviewed: unchanged from 02/24/2013     I personally performed the services described in this documentation, which was scribed in my presence. The recorded information has been reviewed and is accurate.      Joya Gaskins, MD 03/28/13 2351

## 2013-03-29 ENCOUNTER — Encounter (HOSPITAL_COMMUNITY): Payer: Self-pay

## 2013-03-29 DIAGNOSIS — I214 Non-ST elevation (NSTEMI) myocardial infarction: Secondary | ICD-10-CM

## 2013-03-29 LAB — HEPARIN LEVEL (UNFRACTIONATED): Heparin Unfractionated: <0.1 IU/mL — ABNORMAL LOW (ref 0.30–0.70)

## 2013-03-29 MED ORDER — HEPARIN BOLUS VIA INFUSION
4000.0000 [IU] | Freq: Once | INTRAVENOUS | Status: AC
  Administered 2013-03-29: 4000 [IU] via INTRAVENOUS

## 2013-03-29 MED ORDER — ASPIRIN EC 81 MG PO TBEC
81.0000 mg | DELAYED_RELEASE_TABLET | Freq: Every day | ORAL | Status: DC
  Administered 2013-03-30: 81 mg via ORAL
  Filled 2013-03-29 (×2): qty 1

## 2013-03-29 MED ORDER — ACETAMINOPHEN 325 MG PO TABS
650.0000 mg | ORAL_TABLET | ORAL | Status: DC | PRN
  Administered 2013-03-29 – 2013-03-31 (×5): 650 mg via ORAL
  Filled 2013-03-29 (×5): qty 2

## 2013-03-29 MED ORDER — ONDANSETRON HCL 4 MG/2ML IJ SOLN: 4.0000 mg | Freq: Four times a day (QID) | INTRAMUSCULAR | Status: DC | PRN

## 2013-03-29 MED ORDER — METOPROLOL TARTRATE 12.5 MG HALF TABLET
12.5000 mg | ORAL_TABLET | Freq: Two times a day (BID) | ORAL | Status: DC
  Administered 2013-03-29 – 2013-03-30 (×4): 12.5 mg via ORAL
  Filled 2013-03-29 (×7): qty 1

## 2013-03-29 MED ORDER — ASPIRIN 81 MG PO CHEW: 324.0000 mg | CHEWABLE_TABLET | ORAL | Status: AC

## 2013-03-29 MED ORDER — NITROGLYCERIN 2 % TD OINT
1.0000 [in_us] | TOPICAL_OINTMENT | Freq: Four times a day (QID) | TRANSDERMAL | Status: DC
  Administered 2013-03-29 – 2013-03-30 (×7): 1 [in_us] via TOPICAL
  Filled 2013-03-29: qty 30

## 2013-03-29 MED ORDER — HEPARIN (PORCINE) IN NACL 100-0.45 UNIT/ML-% IJ SOLN
1600.0000 [IU]/h | INTRAMUSCULAR | Status: DC
  Administered 2013-03-29 (×2): 1300 [IU]/h via INTRAVENOUS
  Administered 2013-03-30 – 2013-03-31 (×2): 1600 [IU]/h via INTRAVENOUS
  Filled 2013-03-29 (×4): qty 250

## 2013-03-29 MED ORDER — PRAMIPEXOLE DIHYDROCHLORIDE 0.25 MG PO TABS
0.5000 mg | ORAL_TABLET | Freq: Once | ORAL | Status: AC
  Administered 2013-03-29: 0.5 mg via ORAL
  Filled 2013-03-29: qty 2

## 2013-03-29 MED ORDER — HEPARIN (PORCINE) IN NACL 100-0.45 UNIT/ML-% IJ SOLN
1000.0000 [IU]/h | Freq: Once | INTRAMUSCULAR | Status: AC
  Administered 2013-03-29: 1000 [IU]/h via INTRAVENOUS
  Filled 2013-03-29: qty 250

## 2013-03-29 MED ORDER — MORPHINE SULFATE 2 MG/ML IJ SOLN
2.0000 mg | Freq: Once | INTRAMUSCULAR | Status: AC
  Administered 2013-03-29: 2 mg via INTRAVENOUS
  Filled 2013-03-29: qty 1

## 2013-03-29 MED ORDER — HEPARIN BOLUS VIA INFUSION
3000.0000 [IU] | Freq: Once | INTRAVENOUS | Status: AC
  Administered 2013-03-29: 3000 [IU] via INTRAVENOUS
  Filled 2013-03-29: qty 3000

## 2013-03-29 MED ORDER — ATORVASTATIN CALCIUM 40 MG PO TABS
40.0000 mg | ORAL_TABLET | Freq: Every day | ORAL | Status: DC
  Administered 2013-03-29 – 2013-03-30 (×2): 40 mg via ORAL
  Filled 2013-03-29 (×3): qty 1

## 2013-03-29 MED ORDER — ASPIRIN 300 MG RE SUPP
300.0000 mg | RECTAL | Status: AC
  Filled 2013-03-29: qty 1

## 2013-03-29 MED ORDER — PRAMIPEXOLE DIHYDROCHLORIDE 0.25 MG PO TABS
0.5000 mg | ORAL_TABLET | Freq: Every evening | ORAL | Status: DC | PRN
  Administered 2013-03-29 – 2013-03-30 (×2): 0.5 mg via ORAL
  Filled 2013-03-29 (×2): qty 2

## 2013-03-29 MED ORDER — BUPROPION HCL ER (XL) 300 MG PO TB24
300.0000 mg | ORAL_TABLET | Freq: Every day | ORAL | Status: DC
  Administered 2013-03-29 – 2013-03-30 (×2): 300 mg via ORAL
  Filled 2013-03-29 (×3): qty 1

## 2013-03-29 MED ORDER — LISINOPRIL 10 MG PO TABS
10.0000 mg | ORAL_TABLET | Freq: Two times a day (BID) | ORAL | Status: DC
  Administered 2013-03-29 – 2013-03-30 (×4): 10 mg via ORAL
  Filled 2013-03-29 (×7): qty 1

## 2013-03-29 MED ORDER — HEPARIN (PORCINE) IN NACL 100-0.45 UNIT/ML-% IJ SOLN
1000.0000 [IU]/h | INTRAMUSCULAR | Status: DC
  Administered 2013-03-29: 1000 [IU]/h via INTRAVENOUS
  Filled 2013-03-29: qty 250

## 2013-03-29 MED ORDER — NITROGLYCERIN 0.4 MG SL SUBL: 0.4000 mg | SUBLINGUAL_TABLET | SUBLINGUAL | Status: DC | PRN

## 2013-03-29 NOTE — H&P (Signed)
History and Physical  Patient ID: Marvin Carr MRN: [id number], SOB: 09/27/1955 57 y.o. Date of Encounter: 03/29/2013, 6:27 AM  Primary Physician: [name], MD Primary Cardiologist: none  Chief Complaint: chest pain  HPI: 57 y.o. male w/ PMHx significant for HTN, bladder cancer who presented to [facility] on 03/28/2013 with complaints of sudden onset of substernal chest pain. Patient states that he had been at his usual state of health until recently with an episode of substernal chest pressure that occure approx 1 week ago while watching TV. Last 20 minutes and resolved and he rationalized it as not his heart. Then yesterday evening prior to admission, he had again at rest, sudden onset of substernal chest pain that covered the center of his chest. Associated with nausea but no emesis, diaphoresis or shortness of breath. Felt almost "sore" in the chest but was unable get comfortable. Due the fact that the pain persisted, he sought care at [facility]. Pain did not improve until he was seen at [facility] and given SL nitro.  At [facility] was given SL nitro and had positive troponin and heparin gtt was started. His chest pain waxed and waned a little at [facility] but eventually was resolved with SL nitro and nitro paste.  Currently, he states that the pressure is there but minimally. No orthopnea, LE swelling, PND, pleurisy, fever, palpitations, syncope, infections. Quit smoking several years ago. No ETOH, denies illicits.  Had vertigo 3 weeks ago, related to his CPAP machine he thinks, resolved.    EKG revealed NSR with no acute ST changes. CXR was without acute cardiopulmonary abnormalities. Labs are significant for trop of 0.4..   Past Medical History  Diagnosis Date  . Hypertension   . Seasonal allergies   . Numbness and tingling in left hand     occasional  . Headache(784.0)     History of migraines  . Anxiety   . Cancer 2011    bladder cancer  sleep apnea, on CPAP    Surgical History:  Past Surgical History  Procedure Laterality Date  . Turbt  2011  . Colonoscopy  06/28/2011    Procedure: COLONOSCOPY;  Surgeon: Malissa Hippo, MD;  Location: [facility];  Service: Endoscopy;  Laterality: N/A;     Home Meds: Prior to Admission medications   Medication Sig Start Date End Date Taking? Authorizing Provider  aspirin EC 81 MG tablet Take 243 mg by mouth once.   Yes Historical Provider, MD  buPROPion (WELLBUTRIN XL) 300 MG 24 hr tablet Take 300 mg by mouth daily.   Yes Historical Provider, MD  cephALEXin (KEFLEX) 500 MG capsule Take 500 mg by mouth 3 (three) times daily. 10 day course for dental procedure. Patient has some medication remaining   Yes Historical Provider, MD  lisinopril-hydrochlorothiazide (PRINZIDE,ZESTORETIC) 20-12.5 MG per tablet Take 1 tablet by mouth daily.    Yes Historical Provider, MD  loratadine (CLARITIN) 10 MG tablet Take 10 mg by mouth daily as needed for allergies. allergies   Yes Historical Provider, MD  meloxicam (MOBIC) 15 MG tablet Take 15 mg by mouth daily.    Yes Historical Provider, MD  naproxen sodium (ALEVE) 220 MG tablet Take 220 mg by mouth 2 (two) times daily as needed (Pain).   Yes Historical Provider, MD  phenylephrine (SUDAFED PE) 10 MG TABS Take 10 mg by mouth every 4 (four) hours as needed (for allergies/sinus).   Yes Historical Provider, MD  pramipexole (MIRAPEX) 0.5 MG tablet Take  0.5 mg by mouth 2 (two) times daily.     Yes Historical Provider, MD  pseudoephedrine (SUDAFED) 120 MG 12 hr tablet Take 120 mg by mouth every 12 (twelve) hours.   Yes Historical Provider, MD  zolpidem (AMBIEN) 10 MG tablet Take 10 mg by mouth at bedtime.    Yes Historical Provider, MD    Allergies: No Known Allergies  History   Social History  . Marital Status: Divorced    Spouse Name: N/A    Number of Children: N/A  . Years of Education: N/A   Occupational History  . Not on file.   Social History Main Topics  .  Smoking status: Former Smoker -- 0.50 packs/day for 10 years  . Smokeless tobacco: Not on file  . Alcohol Use: Yes     Comment: occasional  . Drug Use: No  . Sexually Active:    Other Topics Concern  . Not on file   Social History Narrative  . No narrative on file     History reviewed. No pertinent family history.  Review of Systems General: negative for chills, fever, night sweats. Has put on 50 lbs over the last 1-2 years. Cardiovascular: negative for chest pain, shortness of breath, dyspnea on exertion, edema, orthopnea, palpitations, paroxysmal nocturnal dyspnea  Dermatological: negative for rash Respiratory: negative for cough or wheezing Urologic: negative for hematuria Abdominal: negative for nausea, vomiting, diarrhea, bright red blood per rectum, melena, or hematemesis Neurologic: negative for visual changes, syncope, or dizziness All other systems reviewed and are otherwise negative except as noted above.  Labs:   Lab Results  Component Value Date   WBC 6.4 03/28/2013   HGB 15.6 03/28/2013   HCT 43.5 03/28/2013   MCV 88.1 03/28/2013   PLT 272 03/28/2013    Recent Labs Lab 03/28/13 2226  NA 138  K 3.5  CL 101  CO2 27  BUN 17  CREATININE 0.78  CALCIUM 9.3  GLUCOSE 111*    Recent Labs  03/28/13 2226 03/29/13 0155  TROPONINI 0.40* 0.39*   No results found for this basename: CHOL, HDL, LDLCALC, TRIG   No results found for this basename: DDIMER    Radiology/Studies:  Dg Chest Port 1 View  03/28/2013   *RADIOLOGY REPORT*  Clinical Data: Chest pain  PORTABLE CHEST - 1 VIEW  Comparison: 02/24/2013  Findings: Cardiac leads overlie the chest.  Heart size is normal. Lung volumes are low but clear.  No pleural effusion.  No acute osseous finding. Lung volumes are low with crowding of the bronchovascular markings.  IMPRESSION: Allowing for low lung volumes and crowding of the bronchovascular markings, no focal acute finding.   Original Report Authenticated By:  Christiana Pellant, M.D.     EKG: sinus, no st or tw changes  Physical Exam: Blood pressure 117/74, pulse 67, temperature 98.4 F (36.9 C), temperature source Oral, resp. rate 19, height 6' (1.829 m), weight 115.758 kg (255 lb 3.2 oz), SpO2 96.00%. General: Well developed, well nourished, in no acute distress. Head: Normocephalic, atraumatic, sclera non-icteric, nares are without discharge Neck: Supple. Negative for carotid bruits. JVD not elevated. Lungs: Clear bilaterally to auscultation without wheezes, rales, or rhonchi. Breathing is unlabored. Heart: RRR with S1 S2. No murmurs, rubs, or gallops appreciated. Abdomen: Soft, non-tender, non-distended with normoactive bowel sounds. No rebound/guarding. No obvious abdominal masses. Msk:  Strength and tone appear normal for age. Extremities: No edema. No clubbing or cyanosis. Distal pedal pulses are 2+ and equal bilaterally. Neuro: Alert  and oriented X 3. Moves all extremities spontaneously. Psych:  Responds to questions appropriately with a normal affect.   Problem List 1. NSTEMI 2. Hypertension 3. H/o bladder cancer s/p resection and localized chemo 4. Obesity 5. Sleep apnea 6. Depression   ASSESSMENT AND PLAN:  57 y.o. male w/ risk factors of HTN, FH at advanced age who presented to [facility] on 03/28/2013 with complaints of sudden onset of substernal chest pain --> elevated troponin consistent with NSTEMI.  Plan is to continue aspirin, home ACEI, add beta blocker, add statin. Continue heparin. Anticipate catheterization evaluation to assess coronary anatomy.   Serial enzymes, telemetry and EKGs. Will attempt to keep chest pain free with SL nitro and paste.  Continue outpt meds of wellbutrin and restless leg med.  Check lipids and HgA1c. TSH to eval weight gain.  Prophylaxis: Heparin gtt Taking PO  Signed, Gaberiel Youngblood C. MD 03/29/2013, 6:27 AM

## 2013-03-29 NOTE — Progress Notes (Signed)
ANTICOAGULATION CONSULT NOTE - Initial Consult  Pharmacy Consult for heparin Indication: NSTEMI  No Known Allergies  Patient Measurements: Height: 6' (182.9 cm) Weight: 255 lb 3.2 oz (115.758 kg) IBW/kg (Calculated) : 77.6 Heparin Dosing Weight: 100kg  Vital Signs: Temp: 98.4 F (36.9 C) (07/19 0533) Temp src: Oral (07/19 0533) BP: 117/74 mmHg (07/19 0533) Pulse Rate: 67 (07/19 0533)  Labs:  Recent Labs  03/28/13 2226 03/29/13 0155 03/29/13 0441 03/29/13 0942  HGB 15.6  --   --   --   HCT 43.5  --   --   --   PLT 272  --   --   --   APTT  --   --  52*  --   HEPARINUNFRC  --   --   --  <0.10*  CREATININE 0.78  --   --   --   TROPONINI 0.40* 0.39*  --  <0.30    Estimated Creatinine Clearance: 133.9 ml/min (by C-G formula based on Cr of 0.78).   Medical History: Past Medical History  Diagnosis Date  . Hypertension   . Seasonal allergies   . Numbness and tingling in left hand     occasional  . Headache(784.0)     History of migraines  . Anxiety   . Cancer 2011    bladder cancer    Medications:  Prescriptions prior to admission  Medication Sig Dispense Refill  . aspirin EC 81 MG tablet Take 243 mg by mouth once.      Marland Kitchen buPROPion (WELLBUTRIN XL) 300 MG 24 hr tablet Take 300 mg by mouth daily.      . cephALEXin (KEFLEX) 500 MG capsule Take 500 mg by mouth 3 (three) times daily. 10 day course for dental procedure. Patient has some medication remaining      . lisinopril-hydrochlorothiazide (PRINZIDE,ZESTORETIC) 20-12.5 MG per tablet Take 1 tablet by mouth daily.       Marland Kitchen loratadine (CLARITIN) 10 MG tablet Take 10 mg by mouth daily as needed for allergies. allergies      . meloxicam (MOBIC) 15 MG tablet Take 15 mg by mouth daily.       . naproxen sodium (ALEVE) 220 MG tablet Take 220 mg by mouth 2 (two) times daily as needed (Pain).      . phenylephrine (SUDAFED PE) 10 MG TABS Take 10 mg by mouth every 4 (four) hours as needed (for allergies/sinus).      .  pramipexole (MIRAPEX) 0.5 MG tablet Take 0.5 mg by mouth 2 (two) times daily.        . pseudoephedrine (SUDAFED) 120 MG 12 hr tablet Take 120 mg by mouth every 12 (twelve) hours.      Marland Kitchen zolpidem (AMBIEN) 10 MG tablet Take 10 mg by mouth at bedtime.        Scheduled:  . aspirin  324 mg Oral NOW   Or  . aspirin  300 mg Rectal NOW  . [START ON 03/30/2013] aspirin EC  81 mg Oral Daily  . atorvastatin  40 mg Oral q1800  . buPROPion  300 mg Oral Daily  . lisinopril  10 mg Oral BID  . metoprolol tartrate  12.5 mg Oral BID  . nitroGLYCERIN  1 inch Topical Q6H   Infusions:  . heparin 1,000 Units/hr (03/29/13 1610)    Assessment: 56yo male c/o sudden onset of sharp radiating CP, troponin elevated, c/w NSTEMI, on heparin.  Heparin level below goal.  No bleeding noted, no issues with drip  or line per RN.    Goal of Therapy:  Heparin level 0.3-0.7 units/ml Monitor platelets by anticoagulation protocol: Yes   Plan:  Bolus 3000 units IV heparin x 1, then increase heparin dip to 1300 units/hr. Check heparin level in 6 hours Daily heparin level/cbc.  Wendie Simmer, PharmD, BCPS Clinical Pharmacist  Pager: [phone]

## 2013-03-29 NOTE — Progress Notes (Signed)
ANTICOAGULATION CONSULT NOTE - Initial Consult  Pharmacy Consult for heparin Indication: NSTEMI  No Known Allergies  Patient Measurements: Height: 6' (182.9 cm) Weight: 255 lb 3.2 oz (115.758 kg) IBW/kg (Calculated) : 77.6 Heparin Dosing Weight: 100kg  Vital Signs: Temp: 98.4 F (36.9 C) (07/19 0533) Temp src: Oral (07/19 0533) BP: 117/74 mmHg (07/19 0533) Pulse Rate: 67 (07/19 0533)  Labs:  Recent Labs  03/28/13 2226 03/29/13 0155 03/29/13 0441  HGB 15.6  --   --   HCT 43.5  --   --   PLT 272  --   --   APTT  --   --  52*  CREATININE 0.78  --   --   TROPONINI 0.40* 0.39*  --     Estimated Creatinine Clearance: 133.9 ml/min (by C-G formula based on Cr of 0.78).   Medical History: Past Medical History  Diagnosis Date  . Hypertension   . Seasonal allergies   . Numbness and tingling in left hand     occasional  . Headache(784.0)     History of migraines  . Anxiety   . Cancer 2011    bladder cancer    Medications:  Prescriptions prior to admission  Medication Sig Dispense Refill  . aspirin EC 81 MG tablet Take 243 mg by mouth once.      Marland Kitchen buPROPion (WELLBUTRIN XL) 300 MG 24 hr tablet Take 300 mg by mouth daily.      . cephALEXin (KEFLEX) 500 MG capsule Take 500 mg by mouth 3 (three) times daily. 10 day course for dental procedure. Patient has some medication remaining      . lisinopril-hydrochlorothiazide (PRINZIDE,ZESTORETIC) 20-12.5 MG per tablet Take 1 tablet by mouth daily.       Marland Kitchen loratadine (CLARITIN) 10 MG tablet Take 10 mg by mouth daily as needed for allergies. allergies      . meloxicam (MOBIC) 15 MG tablet Take 15 mg by mouth daily.       . naproxen sodium (ALEVE) 220 MG tablet Take 220 mg by mouth 2 (two) times daily as needed (Pain).      . phenylephrine (SUDAFED PE) 10 MG TABS Take 10 mg by mouth every 4 (four) hours as needed (for allergies/sinus).      . pramipexole (MIRAPEX) 0.5 MG tablet Take 0.5 mg by mouth 2 (two) times daily.        .  pseudoephedrine (SUDAFED) 120 MG 12 hr tablet Take 120 mg by mouth every 12 (twelve) hours.      Marland Kitchen zolpidem (AMBIEN) 10 MG tablet Take 10 mg by mouth at bedtime.        Scheduled:  . aspirin  324 mg Oral NOW   Or  . aspirin  300 mg Rectal NOW  . [START ON 03/30/2013] aspirin EC  81 mg Oral Daily  . atorvastatin  40 mg Oral q1800  . buPROPion  300 mg Oral Daily  . lisinopril  10 mg Oral BID  . metoprolol tartrate  12.5 mg Oral BID  . nitroGLYCERIN  1 inch Topical Q6H   Infusions:  . heparin      Assessment: 57yo male c/o sudden onset of sharp radiating CP, troponin elevated, c/w NSTEMI, to continue heparin started at [facility].  Goal of Therapy:  Heparin level 0.3-0.7 units/ml Monitor platelets by anticoagulation protocol: Yes   Plan:  EDMD at [facility] started heparin with 4000 unit bolus followed by gtt at 1000 units/hr; will continue for now and monitor heparin  levels and CBC.  Vernard Gambles, PharmD, BCPS  03/29/2013,6:41 AM

## 2013-03-29 NOTE — Progress Notes (Signed)
ANTICOAGULATION CONSULT NOTE - Initial Consult  Pharmacy Consult for heparin Indication: NSTEMI  No Known Allergies  Patient Measurements: Height: 6' (182.9 cm) Weight: 255 lb 3.2 oz (115.758 kg) IBW/kg (Calculated) : 77.6 Heparin Dosing Weight: 100kg  Vital Signs: Temp: 97.6 F (36.4 C) (07/19 1331) Temp src: Oral (07/19 1331) BP: 105/71 mmHg (07/19 1331) Pulse Rate: 71 (07/19 1331)  Labs:  Recent Labs  03/28/13 2226 03/29/13 0155 03/29/13 0441 03/29/13 0942 03/29/13 1823  HGB 15.6  --   --   --   --   HCT 43.5  --   --   --   --   PLT 272  --   --   --   --   APTT  --   --  52*  --   --   HEPARINUNFRC  --   --   --  <0.10* <0.10*  CREATININE 0.78  --   --   --   --   TROPONINI 0.40* 0.39*  --  <0.30  --     Estimated Creatinine Clearance: 133.9 ml/min (by C-G formula based on Cr of 0.78).   Medical History: Past Medical History  Diagnosis Date  . Hypertension   . Seasonal allergies   . Numbness and tingling in left hand     occasional  . Headache(784.0)     History of migraines  . Anxiety   . Cancer 2011    bladder cancer    Medications:  Prescriptions prior to admission  Medication Sig Dispense Refill  . aspirin EC 81 MG tablet Take 243 mg by mouth once.      Marland Kitchen buPROPion (WELLBUTRIN XL) 300 MG 24 hr tablet Take 300 mg by mouth daily.      . cephALEXin (KEFLEX) 500 MG capsule Take 500 mg by mouth 3 (three) times daily. 10 day course for dental procedure. Patient has some medication remaining      . lisinopril-hydrochlorothiazide (PRINZIDE,ZESTORETIC) 20-12.5 MG per tablet Take 1 tablet by mouth daily.       Marland Kitchen loratadine (CLARITIN) 10 MG tablet Take 10 mg by mouth daily as needed for allergies. allergies      . meloxicam (MOBIC) 15 MG tablet Take 15 mg by mouth daily.       . naproxen sodium (ALEVE) 220 MG tablet Take 220 mg by mouth 2 (two) times daily as needed (Pain).      . phenylephrine (SUDAFED PE) 10 MG TABS Take 10 mg by mouth every 4 (four)  hours as needed (for allergies/sinus).      . pramipexole (MIRAPEX) 0.5 MG tablet Take 0.5 mg by mouth 2 (two) times daily.        . pseudoephedrine (SUDAFED) 120 MG 12 hr tablet Take 120 mg by mouth every 12 (twelve) hours.      Marland Kitchen zolpidem (AMBIEN) 10 MG tablet Take 10 mg by mouth at bedtime.        Scheduled:  . aspirin  324 mg Oral NOW   Or  . aspirin  300 mg Rectal NOW  . [START ON 03/30/2013] aspirin EC  81 mg Oral Daily  . atorvastatin  40 mg Oral q1800  . buPROPion  300 mg Oral Daily  . lisinopril  10 mg Oral BID  . metoprolol tartrate  12.5 mg Oral BID  . nitroGLYCERIN  1 inch Topical Q6H   Infusions:  . heparin 1,300 Units/hr (03/29/13 1836)    Assessment: 57yo male c/o sudden onset of sharp  radiating CP, troponin elevated, c/w NSTEMI, on heparin.  Heparin level below goal.  No bleeding noted, no issues with drip or line per RN.    Goal of Therapy:  Heparin level 0.3-0.7 units/ml Monitor platelets by anticoagulation protocol: Yes   Plan:  Bolus 3000 units IV heparin x 1, then increase heparin dip to 1600 units/hr. Check heparin level in 6 hours Daily heparin level/cbc.  Wendie Simmer, PharmD, BCPS Clinical Pharmacist  Pager: [phone]

## 2013-03-29 NOTE — Progress Notes (Signed)
Nutrition Brief Note  Patient identified on the Malnutrition Screening Tool (MST) Report  Per record review pt with no recent weight loss.   Body mass index is 34.6 kg/(m^2). Patient meets criteria for Obesity Class I  based on current BMI.   Current diet order is Heart Healthy, patient is consuming approximately 100% of meals at this time. Labs and medications reviewed.   No nutrition interventions warranted at this time. If nutrition issues arise, please consult RD.   Kendell Bane RD, LDN, CNSC [phone] Pager [phone] After Hours Pager

## 2013-03-30 DIAGNOSIS — I2510 Atherosclerotic heart disease of native coronary artery without angina pectoris: Secondary | ICD-10-CM | POA: Diagnosis present

## 2013-03-30 DIAGNOSIS — I10 Essential (primary) hypertension: Secondary | ICD-10-CM | POA: Diagnosis present

## 2013-03-30 DIAGNOSIS — E782 Mixed hyperlipidemia: Secondary | ICD-10-CM | POA: Diagnosis present

## 2013-03-30 LAB — LIPID PANEL
LDL Cholesterol: 56 mg/dL (ref 0–99)
Triglycerides: 334 mg/dL — ABNORMAL HIGH (ref <150)

## 2013-03-30 LAB — CBC
MCV: 87.8 fL (ref 78.0–100.0)
Platelets: 220 10*3/uL (ref 150–400)
RDW: 12.8 % (ref 11.5–15.5)
WBC: 5.8 10*3/uL (ref 4.0–10.5)

## 2013-03-30 LAB — BASIC METABOLIC PANEL
CO2: 25 mEq/L (ref 19–32)
Calcium: 8.7 mg/dL (ref 8.4–10.5)
Creatinine, Ser: 0.77 mg/dL (ref 0.50–1.35)
GFR calc non Af Amer: >90 mL/min (ref >90)

## 2013-03-30 LAB — HEMOGLOBIN A1C: Mean Plasma Glucose: 111 mg/dL (ref <117)

## 2013-03-30 LAB — HEPARIN LEVEL (UNFRACTIONATED)
Heparin Unfractionated: 0.35 IU/mL (ref 0.30–0.70)
Heparin Unfractionated: 0.38 IU/mL (ref 0.30–0.70)

## 2013-03-30 NOTE — Plan of Care (Signed)
Problem: Phase III Progression Outcomes Goal: Cath/PCI Path as indicated Outcome: Completed/Met Date Met:  03/30/13 Scheduled for 03/31/13

## 2013-03-30 NOTE — Progress Notes (Signed)
ANTICOAGULATION CONSULT NOTE - Initial Consult  Pharmacy Consult for heparin Indication: NSTEMI  No Known Allergies  Patient Measurements: Height: 6' (182.9 cm) Weight: 255 lb 3.2 oz (115.758 kg) IBW/kg (Calculated) : 77.6 Heparin Dosing Weight: 100kg  Vital Signs: Temp: 98.2 F (36.8 C) (07/20 0359) Temp src: Oral (07/20 0359) BP: 102/69 mmHg (07/20 0359) Pulse Rate: 65 (07/20 0359)  Labs:  Recent Labs  03/28/13 2226 03/29/13 0155 03/29/13 0441  03/29/13 0942 03/29/13 1823 03/30/13 0145 03/30/13 0146 03/30/13 0956  HGB 15.6  --   --   --   --   --  13.7  --   --   HCT 43.5  --   --   --   --   --  39.6  --   --   PLT 272  --   --   --   --   --  220  --   --   APTT  --   --  52*  --   --   --   --   --   --   HEPARINUNFRC  --   --   --   < > <0.10* <0.10*  --  0.35 0.38  CREATININE 0.78  --   --   --   --   --  0.77  --   --   TROPONINI 0.40* 0.39*  --   --  <0.30  --   --   --   --   < > = values in this interval not displayed.  Estimated Creatinine Clearance: 133.9 ml/min (by C-G formula based on Cr of 0.77).   Medical History: Past Medical History  Diagnosis Date  . Hypertension   . Seasonal allergies   . Numbness and tingling in left hand     occasional  . Headache(784.0)     History of migraines  . Anxiety   . Cancer 2011    bladder cancer    Medications:  Prescriptions prior to admission  Medication Sig Dispense Refill  . aspirin EC 81 MG tablet Take 243 mg by mouth once.      Marland Kitchen buPROPion (WELLBUTRIN XL) 300 MG 24 hr tablet Take 300 mg by mouth daily.      . cephALEXin (KEFLEX) 500 MG capsule Take 500 mg by mouth 3 (three) times daily. 10 day course for dental procedure. Patient has some medication remaining      . lisinopril-hydrochlorothiazide (PRINZIDE,ZESTORETIC) 20-12.5 MG per tablet Take 1 tablet by mouth daily.       Marland Kitchen loratadine (CLARITIN) 10 MG tablet Take 10 mg by mouth daily as needed for allergies. allergies      . meloxicam (MOBIC)  15 MG tablet Take 15 mg by mouth daily.       . naproxen sodium (ALEVE) 220 MG tablet Take 220 mg by mouth 2 (two) times daily as needed (Pain).      . phenylephrine (SUDAFED PE) 10 MG TABS Take 10 mg by mouth every 4 (four) hours as needed (for allergies/sinus).      . pramipexole (MIRAPEX) 0.5 MG tablet Take 0.5 mg by mouth 2 (two) times daily.        . pseudoephedrine (SUDAFED) 120 MG 12 hr tablet Take 120 mg by mouth every 12 (twelve) hours.      Marland Kitchen zolpidem (AMBIEN) 10 MG tablet Take 10 mg by mouth at bedtime.        Scheduled:  . aspirin EC  81  mg Oral Daily  . atorvastatin  40 mg Oral q1800  . buPROPion  300 mg Oral Daily  . lisinopril  10 mg Oral BID  . metoprolol tartrate  12.5 mg Oral BID  . nitroGLYCERIN  1 inch Topical Q6H   Infusions:  . heparin 1,600 Units/hr (03/30/13 0912)    Assessment: 56yo male c/o sudden onset of sharp radiating CP, troponin elevated, c/w NSTEMI, on heparin.  Heparin level at goal.  No bleeding noted, no issues with drip or line per RN.  Cardiac cath planned for tomorrow.   Goal of Therapy:  Heparin level 0.3-0.7 units/ml Monitor platelets by anticoagulation protocol: Yes   Plan:  Continue IV heparin at1600 units/hr. Daily heparin level/cbc.  Wendie Simmer, PharmD, BCPS Clinical Pharmacist  Pager: [phone]

## 2013-03-30 NOTE — Progress Notes (Signed)
ANTICOAGULATION CONSULT NOTE - Follow Up Consult  Pharmacy Consult for heparin Indication: NSTEMI  Labs:  Recent Labs  03/28/13 2226 03/29/13 0155 03/29/13 0441 03/29/13 0942 03/29/13 1823 03/30/13 0145 03/30/13 0146  HGB 15.6  --   --   --   --  13.7  --   HCT 43.5  --   --   --   --  39.6  --   PLT 272  --   --   --   --  220  --   APTT  --   --  52*  --   --   --   --   HEPARINUNFRC  --   --   --  <0.10* <0.10*  --  0.35  CREATININE 0.78  --   --   --   --  0.77  --   TROPONINI 0.40* 0.39*  --  <0.30  --   --   --     Assessment/Plan:  57yo male now therapeutic on heparin after rate increases.  Will continue gtt at current rate and confirm stable with additional level.  Vernard Gambles, PharmD, BCPS  03/30/2013,2:45 AM

## 2013-03-30 NOTE — Plan of Care (Signed)
Problem: Consults Goal: Chest Pain Patient Education (See Patient Education module for education specifics.)  Outcome: Completed/Met Date Met:  03/30/13 Video #115 (Cath/PCI) and highlighted all the videos that he should watch as well

## 2013-03-30 NOTE — Progress Notes (Signed)
Patient ID: Marvin Carr, male   DOB: 06/28/1956, 57 y.o.   MRN: [id number]   Patient Name: Marvin Carr Date of Encounter: 03/30/2013    SUBJECTIVE  No further chest discomfort. Enzymes slightly positive consistent with a non-STEMI.  CURRENT MEDS . aspirin EC  81 mg Oral Daily  . atorvastatin  40 mg Oral q1800  . buPROPion  300 mg Oral Daily  . lisinopril  10 mg Oral BID  . metoprolol tartrate  12.5 mg Oral BID  . nitroGLYCERIN  1 inch Topical Q6H    OBJECTIVE  Filed Vitals:   03/29/13 0533 03/29/13 1331 03/29/13 2016 03/30/13 0359  BP: 117/74 105/71 101/68 102/69  Pulse: 67 71 73 65  Temp: 98.4 F (36.9 C) 97.6 F (36.4 C) 97.9 F (36.6 C) 98.2 F (36.8 C)  TempSrc: Oral Oral Oral Oral  Resp: 19 18 20 21  Height: 6' (1.829 m)     Weight: 255 lb 3.2 oz (115.758 kg)     SpO2: 96% 96% 94% 96%    Intake/Output Summary (Last 24 hours) at 03/30/13 0908 Last data filed at 03/30/13 0401  Gross per 24 hour  Intake      0 ml  Output   1150 ml  Net  -1150 ml   Filed Weights   03/29/13 0533  Weight: 255 lb 3.2 oz (115.758 kg)    PHYSICAL EXAM  General: Pleasant, NAD.muscular, overweight Neuro: Alert and oriented X 3. Moves all extremities spontaneously. Psych: Normal affect. HEENT:  Normal  Neck: Supple without bruits or JVD. Lungs:  Resp regular and unlabored, CTA. Heart: RRR no s3, s4, or murmurs. Abdomen: Soft, non-tender, non-distended, BS + x 4.  Extremities: No clubbing, cyanosis or edema. DP/PT/Radials 2+ and equal bilaterally.  Accessory Clinical Findings  CBC  Recent Labs  03/28/13 2226 03/30/13 0145  WBC 6.4 5.8  HGB 15.6 13.7  HCT 43.5 39.6  MCV 88.1 87.8  PLT 272 220   Basic Metabolic Panel  Recent Labs  03/28/13 2226 03/30/13 0145  NA 138 138  K 3.5 3.7  CL 101 104  CO2 27 25  GLUCOSE 111* 117*  BUN 17 16  CREATININE 0.78 0.77  CALCIUM 9.3 8.7   Liver Function Tests No results found for this basename: AST, ALT,  ALKPHOS, BILITOT, PROT, ALBUMIN,  in the last 72 hours No results found for this basename: LIPASE, AMYLASE,  in the last 72 hours Cardiac Enzymes  Recent Labs  03/28/13 2226 03/29/13 0155 03/29/13 0942  TROPONINI 0.40* 0.39* <0.30   BNP No components found with this basename: POCBNP,  D-Dimer No results found for this basename: DDIMER,  in the last 72 hours Hemoglobin A1C  Recent Labs  03/29/13 0942  HGBA1C 5.4   Fasting Lipid Panel  Recent Labs  03/30/13 0145  CHOL 150  HDL 27*  LDLCALC 56  TRIG 334*  CHOLHDL 5.6   Thyroid Function Tests  Recent Labs  03/29/13 0942  TSH 2.000    TELE  Normal sinus rhythm  ECG  Normal sinus rhythm, normal EKG  Radiology/Studies  Dg Chest Port 1 View  03/28/2013   *RADIOLOGY REPORT*  Clinical Data: Chest pain  PORTABLE CHEST - 1 VIEW  Comparison: 02/24/2013  Findings: Cardiac leads overlie the chest.  Heart size is normal. Lung volumes are low but clear.  No pleural effusion.  No acute osseous finding. Lung volumes are low with crowding of the bronchovascular markings.  IMPRESSION: Allowing for low lung volumes   and crowding of the bronchovascular markings, no focal acute finding.   Original Report Authenticated By: Gretchen Green, M.D.    ASSESSMENT AND PLAN   #1 non-STEMI. Cardiac catheterization tomorrow. Indication, potential risks, and potential outcome discussed. Patient agrees to proceed. Continue IV heparin. N.p.o. Tonight.  #2 mixed hyperlipidemia. He was not on a statin prior to admission but is now. Even though his LDL is low continue this at low dose at discharge. He needs to lose weight and may be prediabetic looking at his blood sugars. He would be a great candidate for cardiac rehabilitation in [name].  #3 hypertension. He was on lisinopril prior to admission. Would continue a discharge.  Signed, Damarien Nyman MD 

## 2013-03-31 ENCOUNTER — Encounter (HOSPITAL_COMMUNITY): Payer: Self-pay | Admitting: Physician Assistant

## 2013-03-31 ENCOUNTER — Encounter (HOSPITAL_COMMUNITY)
Admission: EM | Disposition: A | Discharge status: Home / Self Care | Payer: Self-pay | Source: Home / Self Care | Attending: Internal Medicine

## 2013-03-31 DIAGNOSIS — I2510 Atherosclerotic heart disease of native coronary artery without angina pectoris: Secondary | ICD-10-CM

## 2013-03-31 HISTORY — PX: LEFT HEART CATHETERIZATION WITH CORONARY ANGIOGRAM: SHX5451

## 2013-03-31 LAB — CBC
HCT: 42.4 % (ref 39.0–52.0)
Platelets: 240 10*3/uL (ref 150–400)
RBC: 4.83 MIL/uL (ref 4.22–5.81)
RDW: 12.8 % (ref 11.5–15.5)
WBC: 7 10*3/uL (ref 4.0–10.5)

## 2013-03-31 SURGERY — LEFT HEART CATHETERIZATION WITH CORONARY ANGIOGRAM
Anesthesia: LOCAL

## 2013-03-31 MED ORDER — METOPROLOL TARTRATE 25 MG PO TABS: 12.5000 mg | ORAL_TABLET | Freq: Two times a day (BID) | ORAL | Status: DC

## 2013-03-31 MED ORDER — ASPIRIN 81 MG PO CHEW: 324.0000 mg | CHEWABLE_TABLET | ORAL | Status: DC

## 2013-03-31 MED ORDER — ATORVASTATIN CALCIUM 40 MG PO TABS: 40.0000 mg | ORAL_TABLET | Freq: Every evening | ORAL | Status: DC

## 2013-03-31 MED ORDER — ASPIRIN EC 81 MG PO TBEC: 81.0000 mg | DELAYED_RELEASE_TABLET | Freq: Every day | ORAL | Status: DC

## 2013-03-31 MED ORDER — NITROGLYCERIN 0.4 MG SL SUBL: 0.4000 mg | SUBLINGUAL_TABLET | SUBLINGUAL | Status: DC | PRN

## 2013-03-31 MED ORDER — SODIUM CHLORIDE 0.9 % IV SOLN: INTRAVENOUS | Status: DC

## 2013-03-31 MED ORDER — VERAPAMIL HCL 2.5 MG/ML IV SOLN
INTRAVENOUS | Status: AC
  Filled 2013-03-31: qty 2

## 2013-03-31 MED ORDER — ASPIRIN 81 MG PO CHEW
324.0000 mg | CHEWABLE_TABLET | ORAL | Status: AC
  Administered 2013-03-31: 324 mg via ORAL
  Filled 2013-03-31: qty 4

## 2013-03-31 MED ORDER — SODIUM CHLORIDE 0.9 % IV SOLN: 250.0000 mL | INTRAVENOUS | Status: DC | PRN

## 2013-03-31 MED ORDER — LISINOPRIL 10 MG PO TABS: 10.0000 mg | ORAL_TABLET | Freq: Two times a day (BID) | ORAL | Status: DC

## 2013-03-31 MED ORDER — SODIUM CHLORIDE 0.9 % IJ SOLN: 3.0000 mL | INTRAMUSCULAR | Status: DC | PRN

## 2013-03-31 MED ORDER — LIDOCAINE HCL (PF) 1 % IJ SOLN
INTRAMUSCULAR | Status: AC
  Filled 2013-03-31: qty 30

## 2013-03-31 MED ORDER — HEPARIN SODIUM (PORCINE) 1000 UNIT/ML IJ SOLN
INTRAMUSCULAR | Status: AC
  Filled 2013-03-31: qty 1

## 2013-03-31 MED ORDER — HEPARIN (PORCINE) IN NACL 2-0.9 UNIT/ML-% IJ SOLN
INTRAMUSCULAR | Status: AC
  Filled 2013-03-31: qty 1000

## 2013-03-31 MED ORDER — MIDAZOLAM HCL 2 MG/2ML IJ SOLN
INTRAMUSCULAR | Status: AC
  Filled 2013-03-31: qty 2

## 2013-03-31 MED ORDER — SODIUM CHLORIDE 0.9 % IJ SOLN: 3.0000 mL | Freq: Two times a day (BID) | INTRAMUSCULAR | Status: DC

## 2013-03-31 NOTE — CV Procedure (Signed)
   Cardiac Catheterization Procedure Note  Name: Marvin Carr MRN: [id number] DOB: Oct 09, 1955  Procedure: Left Heart Cath, Selective Coronary Angiography, LV angiography  Indication: Chest pain suggestive of unstable angina with mildly elevated troponin.  Procedural details: The right radial was prepped, draped, and anesthetized with 1% lidocaine. Using modified Seldinger technique, a 5 French sheath was introduced into the right radial artery. Standard Judkins catheters were used for coronary angiography and left ventriculography. Catheter exchanges were performed over a guidewire. There were no immediate procedural complications. The patient was transferred to the post catheterization recovery area for further monitoring.  Procedural Findings:   Hemodynamics:     AO 119/6    LV 117/72   Coronary angiography:   Coronary dominance: Right  Left mainstem:   Normal  Left anterior descending (LAD):   Mild luminal irregularities.  D1 large with mild luminal irregularities.   D2 small and normal.    Left circumflex (LCx):  Mild luminal irregularities.  AV groove distal 50% stenosis before a large OM.  OM is normal.  RI large with mild luminal irregularities.    Right coronary artery (RCA):  Dominant.  Mild luminal irregularities.  PDA moderate sized and normal.  PL small and normal  Left ventriculography: Left ventricular systolic function is normal, LVEF is estimated at 65%, there is no significant mitral regurgitation   Final Conclusions:  Nonobstructive coronary plaque.  Recommendations:   Medical management.  If pain continues consider stress testing to evaluate the hemodynamic significance of the circumflex lesion.   [name] 03/31/2013, 10:18 AM

## 2013-03-31 NOTE — Discharge Summary (Signed)
Discharge Summary   Patient ID: Marvin Carr MRN: [id number], DOB/AGE: 1956/05/29 57 y.o. Admit date: 03/28/2013 D/C date:     03/31/2013  Primary Cardiologist: New to [facility] this admission - lives in [place]  Primary Discharge Diagnoses:  1. CAD/NSTEMI - cath showing nonobstructive coronary disease for medical therapy (minor luminal irregularities in LAD/LCx/RCA, 50% AV groove distal stenosis before a large OM - if pain recurs, consider stress testing) 2. Hyperglycemia 3. Mixed hyperlipidemia - placed on statin, consider OP f/u labs 4. Obesity - BMI 34.7  Secondary Discharge Diagnoses:  1. HTN 2. Seasonal allergies 3. Numbness and tingling in left hand occasional  4. Headache history of migraines 5. Anxiety 6. History of bladder cancer 7. Sleep apnea, on CPAP  Hospital Course: Marvin Carr is a 57 y/o M with history of HTN, bladder cancer who presented to [facility] ER the night of 03/28/2013 with complaints of sudden onset of substernal chest pain. He was in his usual state of health until recently with an episode of substernal chest pressure that occure approx 1 week ago while watching TV. It lasted 20 minutes and resolved. Then on the evening of presentation, he developed onset of substernal chest pain that covered the center of his chest. It was associated with nausea but no emesis, diaphoresis or shortness of breath. He felt almost "sore" in his chest but unable to get comfortable. Due to the fact that the pain persisted, he sought care at [facility].   At [facility] was given SL nitro and had a mildly positive troponin of 0.40 and heparin gtt was started. His chest pain waxed and waned a little at [facility] but eventually was resolved with SL nitro and nitro paste. He was transferred to [facility] for further evaluation by cardiology. EKG revealed NSR with no acute ST changes. CXR was without acute cardiopulmonary abnormalities. He was continued on heparin IV and observed  over the weekend and remained stable. Troponin 0.40->0.39->0.30. He underwent cardiac cath this morning demonstrating nonobstructive coronary plaque (Mild luminal irregularities in LAD, LCx, RCA, AV groove distal 50% stenosis before a large OM in Cx). Dr. Antoine Poche recommended medical management. If the patient continues to have pain, Dr. Antoine Poche would recommend consideration of stress testing to evaluate the hemodynamic significance of the circumflex lesion. The patient was placed on statin as part of his medical therapy. Lisinopril/HCTZ was changed to lisinopril BID and metoprolol was added. Consider OP f/u labs for statin initiation in 6-8 weeks. Weight loss was advised along with close OP f/u of blood sugar as he may be pre-diabetic. 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 Dr. Antoine Poche has seen and examined the patient today and feels he is stable for discharge.  Discharge Vitals: Blood pressure 112/64, pulse 72, temperature 97.7 F (36.5 C), temperature source Oral, resp. rate 20, height 6' (1.829 m), weight 255 lb 3.2 oz (115.758 kg), SpO2 96.00%.  Labs: Lab Results  Component Value Date   WBC 7.0 03/31/2013   HGB 15.2 03/31/2013   HCT 42.4 03/31/2013   MCV 87.8 03/31/2013   PLT 240 03/31/2013     Recent Labs Lab 03/30/13 0145  NA 138  K 3.7  CL 104  CO2 25  BUN 16  CREATININE 0.77  CALCIUM 8.7  GLUCOSE 117*    Recent Labs  03/28/13 2226 03/29/13 0155  03/29/13 0942  TROPONINI 0.40* 0.39* <0.30   Lab Results  Component Value Date   CHOL 150 03/30/2013   HDL 27* 03/30/2013   LDLCALC 56 03/30/2013   TRIG 334* 03/30/2013    Diagnostic Studies/Procedures   Dg Chest Port 1 View 03/28/2013   *RADIOLOGY REPORT*  Clinical Data: Chest pain  PORTABLE CHEST - 1 VIEW  Comparison: 02/24/2013  Findings: Cardiac leads overlie the chest.  Heart size is normal. Lung  volumes are low but clear.  No pleural effusion.  No acute osseous finding. Lung volumes are low with crowding of the bronchovascular markings.  IMPRESSION: Allowing for low lung volumes and crowding of the bronchovascular markings, no focal acute finding.   Original Report Authenticated By: Christiana Pellant, M.D.    Discharge Medications     Medication List    STOP taking these medications       ALEVE 220 MG tablet  Generic drug:  naproxen sodium     lisinopril-hydrochlorothiazide 20-12.5 MG per tablet  Commonly known as:  PRINZIDE,ZESTORETIC     meloxicam 15 MG tablet  Commonly known as:  MOBIC     phenylephrine 10 MG Tabs  Commonly known as:  SUDAFED PE     pseudoephedrine 120 MG 12 hr tablet  Commonly known as:  SUDAFED      TAKE these medications       aspirin EC 81 MG tablet  Take 1 tablet (81 mg total) by mouth daily.     atorvastatin 40 MG tablet  Commonly known as:  LIPITOR  Take 1 tablet (40 mg total) by mouth every evening.     buPROPion 300 MG 24 hr tablet  Commonly known as:  WELLBUTRIN XL  Take 300 mg by mouth daily.     cephALEXin 500 MG capsule  Commonly known as:  KEFLEX  Take 500 mg by mouth 3 (three) times daily. 10 day course for dental procedure. Patient has some medication remaining     lisinopril 10 MG tablet  Commonly known as:  PRINIVIL,ZESTRIL  Take 1 tablet (10 mg total) by mouth 2 (two) times daily.     loratadine 10 MG tablet  Commonly known as:  CLARITIN  Take 10 mg by mouth daily as needed for allergies. allergies     metoprolol tartrate 25 MG tablet  Commonly known as:  LOPRESSOR  Take 0.5 tablets (12.5 mg total) by mouth 2 (two) times daily.     nitroGLYCERIN 0.4 MG SL tablet  Commonly known as:  NITROSTAT  Place 1 tablet (0.4 mg total) under the tongue every 5 (five) minutes as needed for chest pain (up to 3 doses).     pramipexole 0.5 MG tablet  Commonly known as:  MIRAPEX  Take 0.5 mg by mouth 2 (two) times daily.      zolpidem 10 MG tablet  Commonly known as:  AMBIEN  Take 10 mg by mouth at bedtime.        Disposition   The patient will be discharged in stable condition to home. Discharge Orders   Future Appointments Provider Department Dept Phone   04/09/2013 11:30 AM Gaylord Shih, MD [facility] [place] [phone]   Future Orders Complete By Expires     Diet - low sodium heart healthy  As directed     Discharge instructions  As directed     Comments:      Medicines like Naproxen and Mobic can increase your cardiovascular risk. Please discuss these medicines  with your primary care doctor before restarting. We would also recommend to avoid medications with pseudoephedrine (Sudafed) in them.    Increase activity slowly  As directed     Comments:      No driving for 1 week. No lifting over 10 lbs for 2 weeks. No sexual activity for 2 weeks. Keep procedure site clean & dry. If you notice increased pain, swelling, bleeding or pus, call/return!  You may shower, but no soaking baths/hot tubs/pools for 1 week.  If your chest pain comes back, please call your cardiologist or proceed to the ER.      Follow-up Information   Follow up with [name], MD. (Please follow up with your primary doctor for periodic monitoring of your blood sugar - your labs here show that you are pre-diabetic. We also encourage healthy weight loss.)    Contact information:    [address]  [place] [address] [phone]       Follow up with Valera Castle, MD. (04/09/13 at 11:30am)    Contact information:   [address] [place] [address] [phone]         Duration of Discharge Encounter: Greater than 30 minutes including physician and PA time.  Signed, Ronie Spies PA-C 03/31/2013, 11:56 AM

## 2013-03-31 NOTE — H&P (View-Only) (Signed)
Patient ID: Marvin Carr, male   DOB: [birth year], 58 y.o.   MRN: [id number]   Patient Name: Marvin Carr Date of Encounter: 03/30/2013    SUBJECTIVE  No further chest discomfort. Enzymes slightly positive consistent with a non-STEMI.  CURRENT MEDS . aspirin EC  81 mg Oral Daily  . atorvastatin  40 mg Oral q1800  . buPROPion  300 mg Oral Daily  . lisinopril  10 mg Oral BID  . metoprolol tartrate  12.5 mg Oral BID  . nitroGLYCERIN  1 inch Topical Q6H    OBJECTIVE  Filed Vitals:   03/29/13 0533 03/29/13 1331 03/29/13 2016 03/30/13 0359  BP: 117/74 105/71 101/68 102/69  Pulse: 67 71 73 65  Temp: 98.4 F (36.9 C) 97.6 F (36.4 C) 97.9 F (36.6 C) 98.2 F (36.8 C)  TempSrc: Oral Oral Oral Oral  Resp: 19 18 20 21   Height: 6' (1.829 m)     Weight: 255 lb 3.2 oz (115.758 kg)     SpO2: 96% 96% 94% 96%    Intake/Output Summary (Last 24 hours) at 03/30/13 0908 Last data filed at 03/30/13 0401  Gross per 24 hour  Intake      0 ml  Output   1150 ml  Net  -1150 ml   Filed Weights   03/29/13 0533  Weight: 255 lb 3.2 oz (115.758 kg)    PHYSICAL EXAM  General: Pleasant, NAD.muscular, overweight Neuro: Alert and oriented X 3. Moves all extremities spontaneously. Psych: Normal affect. HEENT:  Normal  Neck: Supple without bruits or JVD. Lungs:  Resp regular and unlabored, CTA. Heart: RRR no s3, s4, or murmurs. Abdomen: Soft, non-tender, non-distended, BS + x 4.  Extremities: No clubbing, cyanosis or edema. DP/PT/Radials 2+ and equal bilaterally.  Accessory Clinical Findings  CBC  Recent Labs  03/28/13 2226 03/30/13 0145  WBC 6.4 5.8  HGB 15.6 13.7  HCT 43.5 39.6  MCV 88.1 87.8  PLT 272 220   Basic Metabolic Panel  Recent Labs  03/28/13 2226 03/30/13 0145  NA 138 138  K 3.5 3.7  CL 101 104  CO2 27 25  GLUCOSE 111* 117*  BUN 17 16  CREATININE 0.78 0.77  CALCIUM 9.3 8.7   Liver Function Tests No results found for this basename: AST, ALT,  ALKPHOS, BILITOT, PROT, ALBUMIN,  in the last 72 hours No results found for this basename: LIPASE, AMYLASE,  in the last 72 hours Cardiac Enzymes  Recent Labs  03/28/13 2226 03/29/13 0155 03/29/13 0942  TROPONINI 0.40* 0.39* <0.30   BNP No components found with this basename: POCBNP,  D-Dimer No results found for this basename: DDIMER,  in the last 72 hours Hemoglobin A1C  Recent Labs  03/29/13 0942  HGBA1C 5.4   Fasting Lipid Panel  Recent Labs  03/30/13 0145  CHOL 150  HDL 27*  LDLCALC 56  TRIG 782*  CHOLHDL 5.6   Thyroid Function Tests  Recent Labs  03/29/13 0942  TSH 2.000    TELE  Normal sinus rhythm  ECG  Normal sinus rhythm, normal EKG  Radiology/Studies  Dg Chest Port 1 View  03/28/2013   *RADIOLOGY REPORT*  Clinical Data: Chest pain  PORTABLE CHEST - 1 VIEW  Comparison: 02/24/2013  Findings: Cardiac leads overlie the chest.  Heart size is normal. Lung volumes are low but clear.  No pleural effusion.  No acute osseous finding. Lung volumes are low with crowding of the bronchovascular markings.  IMPRESSION: Allowing for low lung volumes  and crowding of the bronchovascular markings, no focal acute finding.   Original Report Authenticated By: Christiana Pellant, M.D.    ASSESSMENT AND PLAN   #1 non-STEMI. Cardiac catheterization tomorrow. Indication, potential risks, and potential outcome discussed. Patient agrees to proceed. Continue IV heparin. N.p.o. Tonight.  #2 mixed hyperlipidemia. He was not on a statin prior to admission but is now. Even though his LDL is low continue this at low dose at discharge. He needs to lose weight and may be prediabetic looking at his blood sugars. He would be a great candidate for cardiac rehabilitation in [place].  #3 hypertension. He was on lisinopril prior to admission. Would continue a discharge.  Signed, Valera Castle MD

## 2013-03-31 NOTE — Interval H&P Note (Signed)
History and Physical Interval Note:  03/31/2013 9:23 AM  Rufina Falco  has presented today for surgery, with the diagnosis of cp  The various methods of treatment have been discussed with the patient and family. After consideration of risks, benefits and other options for treatment, the patient has consented to  Procedure(s): LEFT HEART CATHETERIZATION WITH CORONARY ANGIOGRAM (N/A) as a surgical intervention .  The patient's history has been reviewed, patient examined, no change in status, stable for surgery.  I have reviewed the patient's chart and labs.  Questions were answered to the patient's satisfaction.     [name]

## 2013-03-31 NOTE — Progress Notes (Deleted)
Jeananne Rama PA notified of serosanguinous drainage right lat chest and dressing changed and no further drainage noted

## 2013-03-31 NOTE — Progress Notes (Signed)
The patient requested his prescriptions be changed to [facility] in [place]. Prescriptions were re-prescribed. I left message on [facility] Physician Line to disregard prior prescriptions e-scribed their way. Dayna Dunn PA-C

## 2013-04-09 ENCOUNTER — Encounter: Payer: Self-pay | Admitting: Cardiology

## 2013-04-09 ENCOUNTER — Ambulatory Visit (INDEPENDENT_AMBULATORY_CARE_PROVIDER_SITE_OTHER): Payer: BC Managed Care – PPO | Admitting: Cardiology

## 2013-04-09 VITALS — BP 129/76 | HR 61 | Ht 72.0 in | Wt 274.0 lb

## 2013-04-09 DIAGNOSIS — Z6825 Body mass index (BMI) 25.0-25.9, adult: Secondary | ICD-10-CM

## 2013-04-09 DIAGNOSIS — I10 Essential (primary) hypertension: Secondary | ICD-10-CM

## 2013-04-09 DIAGNOSIS — E782 Mixed hyperlipidemia: Secondary | ICD-10-CM

## 2013-04-09 DIAGNOSIS — E663 Overweight: Secondary | ICD-10-CM | POA: Insufficient documentation

## 2013-04-09 DIAGNOSIS — I214 Non-ST elevation (NSTEMI) myocardial infarction: Secondary | ICD-10-CM

## 2013-04-09 NOTE — Assessment & Plan Note (Signed)
Doing well with no recurrent angina or ischemic symptoms. His chest discomfort is musculoskeletal. I have emphasized secondary preventative therapy with followup lipids and a comprehensive metabolic profile in 4 weeks. We will send the results to Dr. Margo Aye. I've advised him to exercise on a stationary bike or walking with appropriate warmup and cool down. Assuming he is doing well, we'll have him followup in a year.

## 2013-04-09 NOTE — Progress Notes (Signed)
HPI Marvin Carr returns after having a non-STEMI. He has a history of hypertension and mixed hyperlipidemia. He is overweight.  Cardiac catheterization showed nonobstructive disease in the circumflex artery. It was decided to treat him medically. If he has recurrent exertional chest discomfort the plan was to do a  functional study to make sure that the circumflex is the culprit.  Since discharge, he's had some discomfort in his upper chest at rest. He can reproduce it with palpation or stretching his chest muscles.  He has not started an exercise program yet. He has a stationary bike and enjoys walking.  Past Medical History  Diagnosis Date  . Hypertension   . Seasonal allergies   . Numbness and tingling in left hand     occasional  . Headache(784.0)     History of migraines  . Anxiety   . Bladder cancer 2011  . Sleep apnea     On CPAP  . CAD (coronary artery disease)     a. Mildly elevated troponin 03/2013, cath with nonobstructive disease including 50% AV groove distal stenosis before large OM (consider stress testing to eval Cx if angina recurs)  . Hyperglycemia   . Mixed hyperlipidemia   . Obesity     Current Outpatient Prescriptions  Medication Sig Dispense Refill  . aspirin EC 81 MG tablet Take 1 tablet (81 mg total) by mouth daily.      Marland Kitchen atorvastatin (LIPITOR) 40 MG tablet Take 1 tablet (40 mg total) by mouth every evening.  30 tablet  3  . buPROPion (WELLBUTRIN XL) 300 MG 24 hr tablet Take 300 mg by mouth daily.      Marland Kitchen lisinopril (PRINIVIL,ZESTRIL) 10 MG tablet Take 1 tablet (10 mg total) by mouth 2 (two) times daily.  60 tablet  3  . metoprolol tartrate (LOPRESSOR) 25 MG tablet Take 0.5 tablets (12.5 mg total) by mouth 2 (two) times daily.  60 tablet  3  . nitroGLYCERIN (NITROSTAT) 0.4 MG SL tablet Place 1 tablet (0.4 mg total) under the tongue every 5 (five) minutes as needed for chest pain (up to 3 doses).  25 tablet  3  . pramipexole (MIRAPEX) 0.5 MG tablet Take 0.5  mg by mouth 2 (two) times daily.        Marland Kitchen zolpidem (AMBIEN) 10 MG tablet Take 10 mg by mouth at bedtime.        No current facility-administered medications for this visit.    No Known Allergies  No family history on file.  History   Social History  . Marital Status: Divorced    Spouse Name: N/A    Number of Children: N/A  . Years of Education: N/A   Occupational History  . Not on file.   Social History Main Topics  . Smoking status: Former Smoker -- 0.50 packs/day for 10 years  . Smokeless tobacco: Not on file  . Alcohol Use: Yes     Comment: occasional  . Drug Use: No  . Sexually Active:    Other Topics Concern  . Not on file   Social History Narrative  . No narrative on file    ROS ALL NEGATIVE EXCEPT THOSE NOTED IN HPI  PE  General Appearance: well developed, well nourished in no acute distress, overweight HEENT: symmetrical face, PERRLA, good dentition  Neck: no JVD, thyromegaly, or adenopathy, trachea midline Chest: symmetric without deformity Cardiac: PMI non-displaced, RRR, normal S1, S2, no gallop or murmur Lung: clear to ausculation and percussion Vascular: all  pulses full without bruits  Abdominal: nondistended, nontender, good bowel sounds, no HSM, no bruits Extremities: no cyanosis, clubbing or edema, no sign of DVT, no varicosities  Skin: normal color, no rashes Neuro: alert and oriented x 3, non-focal Pysch: normal affect  EKG Not repeated BMET    Component Value Date/Time   NA 138 03/30/2013 0145   K 3.7 03/30/2013 0145   CL 104 03/30/2013 0145   CO2 25 03/30/2013 0145   GLUCOSE 117* 03/30/2013 0145   BUN 16 03/30/2013 0145   CREATININE 0.77 03/30/2013 0145   CALCIUM 8.7 03/30/2013 0145   GFRNONAA >90 03/30/2013 0145   GFRAA >90 03/30/2013 0145    Lipid Panel     Component Value Date/Time   CHOL 150 03/30/2013 0145   TRIG 334* 03/30/2013 0145   HDL 27* 03/30/2013 0145   CHOLHDL 5.6 03/30/2013 0145   VLDL 67* 03/30/2013 0145   LDLCALC 56  03/30/2013 0145    CBC    Component Value Date/Time   WBC 7.0 03/31/2013 0510   RBC 4.83 03/31/2013 0510   HGB 15.2 03/31/2013 0510   HCT 42.4 03/31/2013 0510   PLT 240 03/31/2013 0510   MCV 87.8 03/31/2013 0510   MCH 31.5 03/31/2013 0510   MCHC 35.8 03/31/2013 0510   RDW 12.8 03/31/2013 0510

## 2013-04-09 NOTE — Patient Instructions (Addendum)
Your physician recommends that you schedule a follow-up appointment in: 1 year You will receive a reminder letter in the mail in about 10  months reminding you to call and schedule your appointment. If you don't receive this letter, please contact our office.  Your physician recommends that you continue on your current medications as directed. Please refer to the Current Medication list given to you today.  

## 2013-05-23 LAB — LIPID PANEL
Cholesterol: 86 mg/dL (ref 0–200)
Total CHOL/HDL Ratio: 3.1 Ratio
Triglycerides: 141 mg/dL (ref <150)
VLDL: 28 mg/dL (ref 0–40)

## 2013-05-23 LAB — HEPATIC FUNCTION PANEL
ALT: 28 U/L (ref 0–53)
Total Protein: 6 g/dL (ref 6.0–8.3)

## 2013-05-26 MED ORDER — ATORVASTATIN CALCIUM 20 MG PO TABS: 20.0000 mg | ORAL_TABLET | Freq: Every evening | ORAL | Status: DC

## 2013-05-26 NOTE — Addendum Note (Signed)
Addended by: Thompson Grayer on: 05/26/2013 10:19 AM   Modules accepted: Orders

## 2013-07-02 ENCOUNTER — Other Ambulatory Visit (HOSPITAL_COMMUNITY): Payer: Self-pay | Admitting: Internal Medicine

## 2013-07-02 DIAGNOSIS — M549 Dorsalgia, unspecified: Secondary | ICD-10-CM

## 2013-07-02 DIAGNOSIS — M4800 Spinal stenosis, site unspecified: Secondary | ICD-10-CM

## 2013-07-04 ENCOUNTER — Ambulatory Visit (HOSPITAL_COMMUNITY)
Admission: RE | Admit: 2013-07-04 | Discharge: 2013-07-04 | Disposition: A | Discharge status: Home / Self Care | Payer: BC Managed Care – PPO | Source: Ambulatory Visit | Attending: Internal Medicine | Admitting: Internal Medicine

## 2013-07-04 DIAGNOSIS — M545 Low back pain, unspecified: Secondary | ICD-10-CM | POA: Insufficient documentation

## 2013-07-04 DIAGNOSIS — M549 Dorsalgia, unspecified: Secondary | ICD-10-CM

## 2013-07-04 DIAGNOSIS — M4800 Spinal stenosis, site unspecified: Secondary | ICD-10-CM

## 2013-07-04 DIAGNOSIS — Z8551 Personal history of malignant neoplasm of bladder: Secondary | ICD-10-CM | POA: Insufficient documentation

## 2013-07-04 DIAGNOSIS — M5380 Other specified dorsopathies, site unspecified: Secondary | ICD-10-CM | POA: Insufficient documentation

## 2013-07-17 ENCOUNTER — Other Ambulatory Visit: Payer: Self-pay

## 2013-08-04 ENCOUNTER — Encounter (HOSPITAL_COMMUNITY): Payer: Self-pay | Admitting: *Deleted

## 2013-08-14 ENCOUNTER — Other Ambulatory Visit: Payer: Self-pay | Admitting: *Deleted

## 2013-08-14 MED ORDER — LISINOPRIL 10 MG PO TABS: 10.0000 mg | ORAL_TABLET | Freq: Two times a day (BID) | ORAL | Status: DC

## 2013-10-15 ENCOUNTER — Telehealth: Payer: Self-pay | Admitting: Cardiology

## 2013-10-15 NOTE — Telephone Encounter (Signed)
Received fax refill request  Rx # [id number] Medication:  Atorvastatin 20 mg tablet Qty 30 Sig:  Take one tablet every evening Physician:  Verl Blalock

## 2013-10-22 ENCOUNTER — Telehealth: Payer: Self-pay | Admitting: Cardiology

## 2013-10-22 MED ORDER — ATORVASTATIN CALCIUM 20 MG PO TABS: 20.0000 mg | ORAL_TABLET | Freq: Every evening | ORAL | Status: DC

## 2013-10-22 NOTE — Telephone Encounter (Signed)
Medication sent via escribe.  

## 2013-10-22 NOTE — Telephone Encounter (Signed)
Received fax refill request  Rx # [id number] Medication:  Atorvastatin 20  Mg tablet Qty 30 Sig:  Take one tablet every evening Physician:  Verl Blalock

## 2014-03-23 ENCOUNTER — Ambulatory Visit (INDEPENDENT_AMBULATORY_CARE_PROVIDER_SITE_OTHER): Payer: BC Managed Care – PPO | Admitting: Neurology

## 2014-03-23 ENCOUNTER — Encounter: Payer: Self-pay | Admitting: Neurology

## 2014-03-23 VITALS — BP 146/101 | HR 69 | Ht 72.0 in | Wt 208.0 lb

## 2014-03-23 DIAGNOSIS — G589 Mononeuropathy, unspecified: Secondary | ICD-10-CM

## 2014-03-23 DIAGNOSIS — G629 Polyneuropathy, unspecified: Secondary | ICD-10-CM

## 2014-03-23 MED ORDER — GABAPENTIN 100 MG PO CAPS: 100.0000 mg | ORAL_CAPSULE | Freq: Three times a day (TID) | ORAL | Status: DC

## 2014-03-23 NOTE — Progress Notes (Signed)
PATIENT: Marvin Carr DOB: 21-Jan-1956  HISTORICAL  Marvin Carr is a 58 years old right-handed male, referred by his primary care physician Dr. Nevada Crane for evaluation of toes numbness, Carr pain.  He has past medical history of hyperlipidemia, HTN, bladder cancer in 2011, s/p surgery, CAD, in 2014,   He also had a history of restless leg syndrome old past 10 years, initially was treated with Requip, about 5 years ago, was switched to Mirapex 0 point 5 mg every night, he complains of bilateral lower extremity deep achy pain, after prolonged sitting, he denies sensory loss, denies weakness,he has chronic low back pain, previously received epidural injection  He complains of right toes numbness tingling since January 2015, mainly involving the right second, third, first toes, constant numbness, sometimes stabbing pain to his toes, he denies shooting pain from his back to his legs, he denies shooting pain to his left leg, he denies gait difficulty, he denies bowel and bladder incontinence, he denies bilateral fingertips paresthesia  Previous laboratory evaluation showed normal CMP, CBC, A1c 5.4  REVIEW OF SYSTEMS: Full 14 system review of systems performed and notable only for ringing in the ears, not enough sleep, headaches, numbness, insomnia, sleepiness, restless leg  ALLERGIES: No Known Allergies  HOME MEDICATIONS: Current Outpatient Prescriptions on File Prior to Visit  Medication Sig Dispense Refill  . aspirin EC 81 MG tablet Take 1 tablet (81 mg total) by mouth daily.      Marland Kitchen atorvastatin (LIPITOR) 20 MG tablet Take 1 tablet (20 mg total) by mouth every evening.  30 tablet  6  . buPROPion (WELLBUTRIN XL) 300 MG 24 hr tablet Take 300 mg by mouth daily.      . metoprolol tartrate (LOPRESSOR) 25 MG tablet Take 0.5 tablets (12.5 mg total) by mouth 2 (two) times daily.  60 tablet  3  . nitroGLYCERIN (NITROSTAT) 0.4 MG SL tablet Place 1 tablet (0.4 mg total) under the tongue every 5  (five) minutes as needed for chest pain (up to 3 doses).  25 tablet  3  . pramipexole (MIRAPEX) 0.5 MG tablet Take 0.5 mg by mouth 2 (two) times daily.           PAST MEDICAL HISTORY: Past Medical History  Diagnosis Date  . Hypertension   . Seasonal allergies   . Numbness and tingling in left hand     occasional  . Headache(784.0)     History of migraines  . Anxiety   . Bladder cancer 2011  . Sleep apnea     On CPAP  . CAD (coronary artery disease)     a. Mildly elevated troponin 03/2013, cath with nonobstructive disease including 50% AV groove distal stenosis before large OM (consider stress testing to eval Cx if angina recurs)  . Hyperglycemia   . Mixed hyperlipidemia   . Obesity   . Neuropathy     PAST SURGICAL HISTORY: Past Surgical History  Procedure Laterality Date  . Turbt  2011  . Colonoscopy  06/28/2011    Procedure: COLONOSCOPY;  Surgeon: Rogene Houston, MD;  Location: [facility];  Service: Endoscopy;  Laterality: N/A;    FAMILY HISTORY: Family History  Problem Relation Age of Onset  . COPD Father     SOCIAL HISTORY:  History   Social History  . Marital Status: Divorced    Spouse Name: N/A    Number of Children: 0  . Years of Education: college   Occupational History  . Desk  job    Social History Main Topics  . Smoking status: Former Smoker -- 0.50 packs/day for 10 years  . Smokeless tobacco: Never Used     Comment: Quit several yrs prior to 03/2013.  Marland Kitchen Alcohol Use: Yes     Comment: occasional  . Drug Use: No  . Sexual Activity: Not on file   Other Topics Concern  . Not on file   Social History Narrative   Patient lives at home alone patient is divorced. Patient works full time from home.    Education college.   Right handed.   Caffeine four cups daily of coffee.    PHYSICAL EXAM   Filed Vitals:   03/23/14 1017  BP: 146/101  Pulse: 69  Height: 6' (1.829 m)  Weight: 208 lb (94.348 kg)    Not recorded    Body mass index is  28.2 kg/(m^2).   Generalized: In no acute distress  Neck: Supple, no carotid bruits   Cardiac: Regular rate rhythm  Pulmonary: Clear to auscultation bilaterally  Musculoskeletal: No deformity  Neurological examination  Mentation: Alert oriented to time, place, history taking, and causual conversation  Cranial nerve II-XII: Pupils were equal round reactive to light. Extraocular movements were full.  Visual field were full on confrontational test. Bilateral fundi were Carr.  Facial sensation and strength were normal. Hearing was intact to finger rubbing bilaterally. Uvula tongue midline.  Head turning and shoulder shrug and were normal and symmetric.Tongue protrusion into cheek strength was normal.  Motor: Normal tone, bulk and strength.  Sensory: Intact to fine touch, pinprick, preserved vibratory sensation, and proprioception at toes.  Coordination: Normal finger to nose, heel-to-shin bilaterally there was no truncal ataxia  Gait: Rising up from seated position without assistance, normal stance, without trunk ataxia, moderate stride, good arm swing, smooth turning, able to perform tiptoe, and heel walking without difficulty.   Romberg signs: Negative  Deep tendon reflexes: Brachioradialis 2/2, biceps 2/2, triceps 2/2, patellar 2/2, Achilles 2/2, plantar responses were flexor bilaterally.   DIAGNOSTIC DATA (LABS, IMAGING, TESTING) - I reviewed patient records, labs, notes, testing and imaging myself where available.  Lab Results  Component Value Date   WBC 7.0 03/31/2013   HGB 15.2 03/31/2013   HCT 42.4 03/31/2013   MCV 87.8 03/31/2013   PLT 240 03/31/2013      Component Value Date/Time   NA 138 03/30/2013 0145   K 3.7 03/30/2013 0145   CL 104 03/30/2013 0145   CO2 25 03/30/2013 0145   GLUCOSE 117* 03/30/2013 0145   BUN 16 03/30/2013 0145   CREATININE 0.77 03/30/2013 0145   CALCIUM 8.7 03/30/2013 0145   PROT 6.0 05/22/2013 0820   ALBUMIN 3.9 05/22/2013 0820   AST 18 05/22/2013  0820   ALT 28 05/22/2013 0820   ALKPHOS 70 05/22/2013 0820   BILITOT 0.7 05/22/2013 0820   GFRNONAA >90 03/30/2013 0145   GFRAA >90 03/30/2013 0145   Lab Results  Component Value Date   CHOL 86 05/22/2013   HDL 28* 05/22/2013   LDLCALC 30 05/22/2013   TRIG 141 05/22/2013   CHOLHDL 3.1 05/22/2013   Lab Results  Component Value Date   HGBA1C 5.5 03/30/2013   No results found for this basename: [id number]   Lab Results  Component Value Date   TSH 2.000 03/29/2013    ASSESSMENT AND PLAN  Marvin Carr is a 58 y.o. male With past medical history of low back pain, restless leg syndrome, now complains of six-month  history of right second, third, and fourth toes numbness, stabbing pain, essentially normal neurological examinations  1, differentiation diagnosis including peripheral neuropathy, proceed with EMG nerve conduction study 2. Laboratory evaluation for common etiology, including B12,   Marcial Pacas, M.D. Ph.D.  [facility] [address], [place], [place] [address] [phone]

## 2014-03-24 LAB — SEDIMENTATION RATE: SED RATE: 2 mm/h (ref 0–30)

## 2014-03-24 LAB — RPR: RPR: NONREACTIVE

## 2014-03-24 LAB — FOLATE: FOLATE: 18.8 ng/mL (ref >3.0)

## 2014-03-24 LAB — VITAMIN B12: Vitamin B-12: 1263 pg/mL — ABNORMAL HIGH (ref 211–946)

## 2014-03-24 LAB — C-REACTIVE PROTEIN: CRP: 1.5 mg/L (ref 0.0–4.9)

## 2014-03-24 LAB — ANA W/REFLEX IF POSITIVE: ANA: NEGATIVE

## 2014-03-25 NOTE — Progress Notes (Signed)
Quick Note:  Shared normal labs with patient ,verbalized understanding ______ 

## 2014-04-15 ENCOUNTER — Ambulatory Visit (INDEPENDENT_AMBULATORY_CARE_PROVIDER_SITE_OTHER): Payer: BC Managed Care – PPO | Admitting: Neurology

## 2014-04-15 ENCOUNTER — Encounter (INDEPENDENT_AMBULATORY_CARE_PROVIDER_SITE_OTHER): Payer: Self-pay

## 2014-04-15 DIAGNOSIS — G629 Polyneuropathy, unspecified: Secondary | ICD-10-CM

## 2014-04-15 DIAGNOSIS — G589 Mononeuropathy, unspecified: Secondary | ICD-10-CM

## 2014-04-15 DIAGNOSIS — Z0289 Encounter for other administrative examinations: Secondary | ICD-10-CM

## 2014-04-15 NOTE — Procedures (Signed)
   NCS (NERVE CONDUCTION STUDY) WITH EMG (ELECTROMYOGRAPHY) REPORT   STUDY DATE: August 5th 2015 PATIENT NAME: Marvin Carr DOB: [birth year] MRN: [id number]    TECHNOLOGIST: Laretta Alstrom ELECTROMYOGRAPHER: Marcial Pacas M.D.  CLINICAL INFORMATION:  58 years old male, with past medical history of restless leg syndrome, complains of right second to fourth toe paresthesia  FINDINGS: NERVE CONDUCTION STUDY: Bilateral peroneal sensory responses were normal. Bilateral tibial, right peroneal motor responses were normal. Left peroneal motor responses showed mildly decreased C. map amplitude with normal distal latency, conduction velocity. Bilateral tibial H. 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  INTERPRETING PHYSICIAN:   Marcial Pacas M.D. Ph.D. [facility] [address], [place], [place] [address] [phone]

## 2014-06-11 ENCOUNTER — Emergency Department (HOSPITAL_COMMUNITY): Payer: BC Managed Care – PPO

## 2014-06-11 ENCOUNTER — Encounter (HOSPITAL_COMMUNITY): Payer: Self-pay | Admitting: Emergency Medicine

## 2014-06-11 ENCOUNTER — Emergency Department (HOSPITAL_COMMUNITY)
Admission: EM | Admit: 2014-06-11 | Discharge: 2014-06-11 | Disposition: A | Discharge status: Home / Self Care | Payer: BC Managed Care – PPO | Attending: Emergency Medicine | Admitting: Emergency Medicine

## 2014-06-11 DIAGNOSIS — Z87891 Personal history of nicotine dependence: Secondary | ICD-10-CM | POA: Diagnosis not present

## 2014-06-11 DIAGNOSIS — G629 Polyneuropathy, unspecified: Secondary | ICD-10-CM | POA: Insufficient documentation

## 2014-06-11 DIAGNOSIS — E782 Mixed hyperlipidemia: Secondary | ICD-10-CM | POA: Diagnosis not present

## 2014-06-11 DIAGNOSIS — R079 Chest pain, unspecified: Secondary | ICD-10-CM

## 2014-06-11 DIAGNOSIS — R6883 Chills (without fever): Secondary | ICD-10-CM | POA: Diagnosis not present

## 2014-06-11 DIAGNOSIS — I10 Essential (primary) hypertension: Secondary | ICD-10-CM | POA: Diagnosis not present

## 2014-06-11 DIAGNOSIS — Z9981 Dependence on supplemental oxygen: Secondary | ICD-10-CM | POA: Insufficient documentation

## 2014-06-11 DIAGNOSIS — I2510 Atherosclerotic heart disease of native coronary artery without angina pectoris: Secondary | ICD-10-CM | POA: Diagnosis not present

## 2014-06-11 DIAGNOSIS — Z79899 Other long term (current) drug therapy: Secondary | ICD-10-CM | POA: Insufficient documentation

## 2014-06-11 DIAGNOSIS — F419 Anxiety disorder, unspecified: Secondary | ICD-10-CM | POA: Diagnosis not present

## 2014-06-11 DIAGNOSIS — R001 Bradycardia, unspecified: Secondary | ICD-10-CM | POA: Insufficient documentation

## 2014-06-11 DIAGNOSIS — E669 Obesity, unspecified: Secondary | ICD-10-CM | POA: Diagnosis not present

## 2014-06-11 DIAGNOSIS — R61 Generalized hyperhidrosis: Secondary | ICD-10-CM | POA: Insufficient documentation

## 2014-06-11 DIAGNOSIS — G4730 Sleep apnea, unspecified: Secondary | ICD-10-CM | POA: Diagnosis not present

## 2014-06-11 DIAGNOSIS — Z8551 Personal history of malignant neoplasm of bladder: Secondary | ICD-10-CM | POA: Diagnosis not present

## 2014-06-11 LAB — BASIC METABOLIC PANEL
ANION GAP: 9 (ref 5–15)
BUN: 23 mg/dL (ref 6–23)
CALCIUM: 8.9 mg/dL (ref 8.4–10.5)
CO2: 27 meq/L (ref 19–32)
Chloride: 106 mEq/L (ref 96–112)
Creatinine, Ser: 0.74 mg/dL (ref 0.50–1.35)
GFR calc Af Amer: >90 mL/min (ref >90)
Glucose, Bld: 129 mg/dL — ABNORMAL HIGH (ref 70–99)
POTASSIUM: 4.2 meq/L (ref 3.7–5.3)
SODIUM: 142 meq/L (ref 137–147)

## 2014-06-11 LAB — I-STAT TROPONIN, ED
TROPONIN I, POC: 0 ng/mL (ref 0.00–0.08)
Troponin i, poc: 0 ng/mL (ref 0.00–0.08)

## 2014-06-11 LAB — CBC WITH DIFFERENTIAL/PLATELET
BASOS ABS: 0 10*3/uL (ref 0.0–0.1)
Basophils Relative: 0 % (ref 0–1)
EOS ABS: 0.1 10*3/uL (ref 0.0–0.7)
EOS PCT: 1 % (ref 0–5)
HCT: 43.3 % (ref 39.0–52.0)
Hemoglobin: 14.7 g/dL (ref 13.0–17.0)
LYMPHS ABS: 1.6 10*3/uL (ref 0.7–4.0)
LYMPHS PCT: 26 % (ref 12–46)
MCH: 31.8 pg (ref 26.0–34.0)
MCHC: 33.9 g/dL (ref 30.0–36.0)
MCV: 93.7 fL (ref 78.0–100.0)
Monocytes Absolute: 0.6 10*3/uL (ref 0.1–1.0)
Monocytes Relative: 10 % (ref 3–12)
NEUTROS PCT: 63 % (ref 43–77)
Neutro Abs: 3.8 10*3/uL (ref 1.7–7.7)
PLATELETS: 195 10*3/uL (ref 150–400)
RBC: 4.62 MIL/uL (ref 4.22–5.81)
RDW: 12.8 % (ref 11.5–15.5)
WBC: 6 10*3/uL (ref 4.0–10.5)

## 2014-06-11 LAB — TROPONIN I: Troponin I: <0.3 ng/mL (ref <0.30)

## 2014-06-11 NOTE — ED Provider Notes (Signed)
CSN: [id number]     Arrival date & time 06/11/14  0554 History   First MD Initiated Contact with Patient 06/11/14 0555     Chief Complaint  Patient presents with  . Bradycardia  . Chest Pain     (Consider location/radiation/quality/duration/timing/severity/associated sxs/prior Treatment) Patient is a 58 y.o. male presenting with chest pain. The history is provided by the patient and medical records.  Chest Pain Associated symptoms: diaphoresis    This is a 58 y.o. M with PMH significant for HTN, non-obstructive CAD, hyperlipidemia, bladder cancer in 2011, presenting to the ED for chest pain.  Patient states pain started around 0430 which awoke him from sleep.  Described as a pressure in his mid and sub-sternal areas without radiation, associated symptoms of diaphoresis and chills.  Also notes some brief SOB but feels it was due to anxiety. Denies palpitations, dizziness, weakness, nausea, vomiting.  Patient took 325 ASA and NTG.  On EMS arrival, pt was noted to have HR of 44.  Given 1mg  atropine, HR increased to 64.  On arrival, patient states he is chest pain free.  Hx of similar episode last July when he had an NSTEMI.  LHC was 03/31/14 which revealed 50% non-obstructive disease of LCx.  Patient followed regularly by Cardiology, Dr. Jenell Milliner.  VS stable on arrival.  Past Medical History  Diagnosis Date  . Hypertension   . Seasonal allergies   . Numbness and tingling in left hand     occasional  . Headache(784.0)     History of migraines  . Anxiety   . Bladder cancer 2011  . Sleep apnea     On CPAP  . CAD (coronary artery disease)     a. Mildly elevated troponin 03/2013, cath with nonobstructive disease including 50% AV groove distal stenosis before large OM (consider stress testing to eval Cx if angina recurs)  . Hyperglycemia   . Mixed hyperlipidemia   . Obesity   . Neuropathy    Past Surgical History  Procedure Laterality Date  . Turbt  2011  . Colonoscopy  06/28/2011   Procedure: COLONOSCOPY;  Surgeon: Rogene Houston, MD;  Location: [facility];  Service: Endoscopy;  Laterality: N/A;   Family History  Problem Relation Age of Onset  . COPD Father    History  Substance Use Topics  . Smoking status: Former Smoker -- 0.50 packs/day for 10 years  . Smokeless tobacco: Never Used     Comment: Quit several yrs prior to 03/2013.  Marland Kitchen Alcohol Use: Yes     Comment: occasional    Review of Systems  Constitutional: Positive for diaphoresis.  Cardiovascular: Positive for chest pain.  All other systems reviewed and are negative.     Allergies  Review of patient's allergies indicates no known allergies.  Home Medications   Prior to Admission medications   Medication Sig Start Date End Date Taking? Authorizing Provider  atorvastatin (LIPITOR) 20 MG tablet Take 1 tablet (20 mg total) by mouth every evening. 10/22/13   Lendon Colonel, NP  buPROPion (WELLBUTRIN XL) 300 MG 24 hr tablet Take 300 mg by mouth daily.    Historical Provider, MD  Cetirizine HCl (ZYRTEC ALLERGY) 10 MG CAPS Take by mouth as needed. 04/06/08   Historical Provider, MD  eszopiclone (LUNESTA) 2 MG TABS tablet Take 2 mg by mouth at bedtime as needed for sleep. Take immediately before bedtime    Historical Provider, MD  gabapentin (NEURONTIN) 100 MG capsule Take 1 capsule (  100 mg total) by mouth 3 (three) times daily. 03/23/14   Marcial Pacas, MD  HYDROcodone-acetaminophen (NORCO/VICODIN) 5-325 MG per tablet 325 tablets as needed. 04/07/10   Historical Provider, MD  lisinopril (PRINIVIL,ZESTRIL) 10 MG tablet Take 10 mg by mouth daily. 08/14/13   Herminio Commons, MD  metoprolol tartrate (LOPRESSOR) 25 MG tablet Take 0.5 tablets (12.5 mg total) by mouth 2 (two) times daily. 03/31/13   Dayna N Dunn, PA-C  Multiple Vitamins-Minerals (MULTIVITAMIN & MINERAL PO) Take by mouth daily. 03/18/09   Historical Provider, MD  nitroGLYCERIN (NITROSTAT) 0.4 MG SL tablet Place 1 tablet (0.4 mg total) under the  tongue every 5 (five) minutes as needed for chest pain (up to 3 doses). 03/31/13   Dayna N Dunn, PA-C  pramipexole (MIRAPEX) 0.5 MG tablet Take 0.5 mg by mouth 2 (two) times daily.      Historical Provider, MD  traMADol (ULTRAM) 50 MG tablet Take 50 mg by mouth daily. 03/18/09   Historical Provider, MD   BP 102/69  Pulse 70  Temp(Src) 97.7 F (36.5 C)  Resp 17  Ht 6\' 2"  (1.88 m)  Wt 210 lb (95.255 kg)  BMI 26.95 kg/m2  SpO2 94%  Physical Exam  Nursing note and vitals reviewed. Constitutional: He is oriented to person, place, and time. He appears well-developed and well-nourished. No distress.  HENT:  Head: Normocephalic and atraumatic.  Mouth/Throat: Oropharynx is clear and moist.  Eyes: Conjunctivae and EOM are normal. Pupils are equal, round, and reactive to light.  Neck: Normal range of motion. Neck supple.  Cardiovascular: Normal rate, regular rhythm and normal heart sounds.   Pulmonary/Chest: Effort normal and breath sounds normal. No respiratory distress. He has no wheezes.  Abdominal: Soft. Bowel sounds are normal. There is no tenderness. There is no guarding.  Musculoskeletal: Normal range of motion. He exhibits no edema.  Neurological: He is alert and oriented to person, place, and time.  Skin: Skin is warm and dry. He is not diaphoretic.  Psychiatric: He has a normal mood and affect.    ED Course  Procedures (including critical care time) Labs Review Labs Reviewed  BASIC METABOLIC PANEL - Abnormal; Notable for the following:    Glucose, Bld 129 (*)    All other components within normal limits  CBC WITH DIFFERENTIAL  TROPONIN I  Randolm Idol, ED  Randolm Idol, ED    Imaging Review Dg Chest 2 View  06/11/2014   CLINICAL DATA:  Bradycardia.  Chest pain.  EXAM: CHEST  2 VIEW  COMPARISON:  03/28/2013  FINDINGS: The heart size and mediastinal contours are within normal limits. Both lungs are clear. The visualized skeletal structures are unremarkable.   IMPRESSION: No active cardiopulmonary disease.   Electronically Signed   By: Lucienne Capers M.D.   On: 06/11/2014 06:33     EKG Interpretation   Date/Time:  Thursday June 11 2014 06:02:39 EDT Ventricular Rate:  69 PR Interval:  176 QRS Duration: 109 QT Interval:  415 QTC Calculation: 445 R Axis:   15 Text Interpretation:  Sinus rhythm Minimal ST elevation, anterolateral  leads No significant change was found Confirmed by CAMPOS  MD, Lennette Bihari  ([id number]) on 06/11/2014 6:11:39 AM      MDM   Final diagnoses:  Chest pain, unspecified chest pain type   58 year old male with nonobstructive CAD, presenting to the ED for episode of chest pain which woke him from sleep. Endorsed brief diaphoresis and chills. Had ASA and SL NTG prior to  arrival, pain free on arrival. EKG sinus rhythm without acute ischemic changes. Initial troponin negative. Chest x-ray is clear. Remainder of lab work is reassuring. Given patient's history, will obtain delta troponin.  Delta troponin negative. Patient has remained pain-free during entire ED stay without recurrence of any symptoms.  Low suspicion for ACS, PE, dissection, or other acute cardiac event at this time. Patient we discharged home to followup with his cardiologist.  Discussed plan with patient, he/she acknowledged understanding and agreed with plan of care.  Return precautions given for new or worsening symptoms.  Case discussed with attending physician, Dr. Venora Maples, who agrees with assessment and plan of care.  Larene Pickett, PA-C 06/11/14 1043

## 2014-06-11 NOTE — Discharge Instructions (Signed)
Continue all home meds as directed. Follow-up with Dr. Verl Blalock-- call and schedule appt. Return to the ED for new or worsening symptoms.

## 2014-06-11 NOTE — ED Notes (Signed)
Per EMS pt reports chest pain that woke him up from his sleep, pt states he had central chest pain, pt reports he took 324 mg of aspirin and 1 nitroglycerin. EMS reports pt's HR was 44, a total of 1 mg of atropine was adm by EMS. Pt's HR upon arrival to department was 64. Pt is pain free upon arrival. A&O X4.

## 2014-06-12 NOTE — ED Provider Notes (Signed)
Medical screening examination/treatment/procedure(s) were performed by non-physician practitioner and as supervising physician I was immediately available for consultation/collaboration.   EKG Interpretation   Date/Time:  Thursday June 11 2014 06:02:39 EDT Ventricular Rate:  69 PR Interval:  176 QRS Duration: 109 QT Interval:  415 QTC Calculation: 445 R Axis:   15 Text Interpretation:  Sinus rhythm Minimal ST elevation, anterolateral  leads No significant change was found Confirmed by Brenley Priore  MD, Lennette Bihari  ([id number]) on 06/11/2014 6:11:39 AM        Hoy Morn, MD 06/12/14 [phone]

## 2014-07-03 ENCOUNTER — Other Ambulatory Visit: Payer: Self-pay | Admitting: Adult Health

## 2014-07-09 ENCOUNTER — Encounter: Payer: Self-pay | Admitting: Cardiology

## 2014-07-09 ENCOUNTER — Ambulatory Visit (INDEPENDENT_AMBULATORY_CARE_PROVIDER_SITE_OTHER): Payer: BC Managed Care – PPO | Admitting: Cardiology

## 2014-07-09 ENCOUNTER — Encounter: Payer: Self-pay | Admitting: *Deleted

## 2014-07-09 VITALS — BP 120/62 | HR 64 | Ht 72.0 in | Wt 211.0 lb

## 2014-07-09 DIAGNOSIS — E785 Hyperlipidemia, unspecified: Secondary | ICD-10-CM

## 2014-07-09 DIAGNOSIS — R079 Chest pain, unspecified: Secondary | ICD-10-CM

## 2014-07-09 DIAGNOSIS — E782 Mixed hyperlipidemia: Secondary | ICD-10-CM

## 2014-07-09 DIAGNOSIS — I25119 Atherosclerotic heart disease of native coronary artery with unspecified angina pectoris: Secondary | ICD-10-CM

## 2014-07-09 DIAGNOSIS — I10 Essential (primary) hypertension: Secondary | ICD-10-CM

## 2014-07-09 NOTE — Assessment & Plan Note (Signed)
History outlined above. Patient had NSTEMI in July of last year with 50% circumflex stenosis that was managed medically. Recent episode of chest pain, although cardiac markers negative and ECG without acute ST segment changes. We will obtain a follow-up Exercise Cardiolite to assess ischemic burden, if low risk then plan to continue medical therapy and observation.

## 2014-07-09 NOTE — Assessment & Plan Note (Signed)
Continues on Lipitor. Follow-up FLP and LFTs will be obtained.

## 2014-07-09 NOTE — Patient Instructions (Signed)
Your physician wants you to follow-up in: 1 year with Dr. Ferne Reus will receive a reminder letter in the mail two months in advance. If you don't receive a letter, please call our office to schedule the follow-up appointment.  Your physician recommends that you continue on your current medications as directed. Please refer to the Current Medication list given to you today.  Your physician has requested that you have a lexiscan myoview. For further information please visit [email or web address]. Please follow instruction sheet, as given.   Your physician recommends that you return for lab work L/L. Please do not eat or drink 12 hours before getting blood work   Thank you for choosing [facility]!!

## 2014-07-09 NOTE — Assessment & Plan Note (Signed)
Blood pressure well-controlled today. 

## 2014-07-09 NOTE — Progress Notes (Signed)
Reason for visit: CAD  Clinical Summary Marvin Carr is a 58 y.o.male former patient of Dr. Verl Blalock, last seen in July 2014. This is our first visit in the office. Records review finds ER visit in October with chest pain. ECG showed sinus rhythm with nonspecific ST changes, and troponin I levels were negative. Chest x-ray reported no acute cardiopulmonary disease. He was discharged home.  He describes the episode as an intense discomfort in his chest, lasted for at least a few hours, did not respond to nitroglycerin or aspirin. It occurred in the evening at rest. Subsequent to that event he had a more atypical sharp, sticking pain in his left chest that was worse when he would move from side to side. He does state that the original chest pain felt like his event in July of last year.  Cardiac history is outlined below including cardiac catheterization from July 2014.  Lipid panel from September 2014 showed cholesterol 86, triglycerides 141, HDL 28, LDL 30. Follow-up as yet.  No Known Allergies  Current Outpatient Prescriptions  Medication Sig Dispense Refill  . aspirin EC 81 MG tablet Take 81 mg by mouth daily.      Marland Kitchen atorvastatin (LIPITOR) 20 MG tablet Take 20 mg by mouth daily.      Marland Kitchen buPROPion (WELLBUTRIN XL) 300 MG 24 hr tablet Take 300 mg by mouth daily.      . cetirizine (ZYRTEC) 10 MG tablet Take 10 mg by mouth daily as needed for allergies.      . chlorhexidine (PERIDEX) 0.12 % solution       . eszopiclone (LUNESTA) 2 MG TABS tablet Take 2 mg by mouth at bedtime as needed for sleep. Take immediately before bedtime      . HYDROcodone-acetaminophen (NORCO/VICODIN) 5-325 MG per tablet Take 1-2 tablets by mouth every 4 (four) hours as needed for moderate pain.       Marland Kitchen lisinopril (PRINIVIL,ZESTRIL) 10 MG tablet Take 10 mg by mouth daily.      . metoprolol tartrate (LOPRESSOR) 25 MG tablet Take 12.5 mg by mouth 2 (two) times daily.      . nitroGLYCERIN (NITROSTAT) 0.4 MG SL tablet Place 0.4  mg under the tongue every 5 (five) minutes as needed for chest pain.      . pramipexole (MIRAPEX) 0.5 MG tablet Take 1 mg by mouth daily.       . traMADol (ULTRAM) 50 MG tablet Take 50 mg by mouth daily as needed for moderate pain.        No current facility-administered medications for this visit.    Past Medical History  Diagnosis Date  . Essential hypertension   . Seasonal allergies   . Headache(784.0)     History of migraines  . Anxiety   . Bladder cancer 2011  . Sleep apnea     On CPAP  . CAD (coronary artery disease), native coronary artery     a. Mildly elevated troponin 03/2013, cath with nonobstructive disease including 50% AV groove distal stenosis before large OM  . Hyperglycemia   . Mixed hyperlipidemia   . Obesity   . Neuropathy     Past Surgical History  Procedure Laterality Date  . Turbt  2011  . Colonoscopy  06/28/2011    Procedure: COLONOSCOPY;  Surgeon: Rogene Houston, MD;  Location: [facility];  Service: Endoscopy;  Laterality: N/A;    Family History  Problem Relation Age of Onset  . COPD Father  Social History Marvin Carr reports that he quit smoking about 3 years ago. His smoking use included Cigarettes. He has a 5 pack-year smoking history. He has never used smokeless tobacco. Marvin Carr reports that he drinks alcohol.  Review of Systems Complete review of systems negative except as otherwise outlined in the clinical summary and also the following. No exertional chest pain at this time. NYHA class II dyspnea. No palpitations or syncope. No claudication.  Physical Examination Filed Vitals:   07/09/14 1329  BP: 120/62  Pulse: 64   Filed Weights   07/09/14 1329  Weight: 211 lb (95.709 kg)   Patient appears comfortable at rest. HEENT: Conjunctiva and lids normal, oropharynx clear. Neck: Supple, no elevated JVP or carotid bruits, no thyromegaly. Lungs: Clear to auscultation, nonlabored breathing at rest. Cardiac: Regular rate and  rhythm, no S3 or significant systolic murmur, no pericardial rub. Abdomen: Soft, nontender, bowel sounds present, no guarding or rebound. Extremities: No pitting edema, distal pulses 2+. Skin: Warm and dry. Musculoskeletal: No kyphosis. Neuropsychiatric: Alert and oriented x3, affect grossly appropriate.   Problem List and Plan   CAD (coronary artery disease), native coronary artery History outlined above. Patient had NSTEMI in July of last year with 50% circumflex stenosis that was managed medically. Recent episode of chest pain, although cardiac markers negative and ECG without acute ST segment changes. We will obtain a follow-up Exercise Cardiolite to assess ischemic burden, if low risk then plan to continue medical therapy and observation.  Mixed hyperlipidemia Continues on Lipitor. Follow-up FLP and LFTs will be obtained.  Essential hypertension Blood pressure well controlled today.    Satira Sark, M.D., F.A.C.C.

## 2014-07-20 ENCOUNTER — Encounter (HOSPITAL_COMMUNITY)
Admission: RE | Admit: 2014-07-20 | Discharge: 2014-07-20 | Disposition: A | Discharge status: Home / Self Care | Payer: BC Managed Care – PPO | Source: Ambulatory Visit | Attending: Cardiology | Admitting: Cardiology

## 2014-07-20 ENCOUNTER — Ambulatory Visit (HOSPITAL_COMMUNITY)
Admission: RE | Admit: 2014-07-20 | Discharge: 2014-07-20 | Disposition: A | Discharge status: Home / Self Care | Payer: BC Managed Care – PPO | Source: Ambulatory Visit | Attending: Cardiology | Admitting: Cardiology

## 2014-07-20 ENCOUNTER — Telehealth: Payer: Self-pay | Admitting: *Deleted

## 2014-07-20 ENCOUNTER — Encounter (HOSPITAL_COMMUNITY): Payer: Self-pay

## 2014-07-20 DIAGNOSIS — R079 Chest pain, unspecified: Secondary | ICD-10-CM

## 2014-07-20 DIAGNOSIS — E785 Hyperlipidemia, unspecified: Secondary | ICD-10-CM | POA: Diagnosis not present

## 2014-07-20 DIAGNOSIS — I10 Essential (primary) hypertension: Secondary | ICD-10-CM | POA: Diagnosis not present

## 2014-07-20 DIAGNOSIS — I2510 Atherosclerotic heart disease of native coronary artery without angina pectoris: Secondary | ICD-10-CM | POA: Diagnosis not present

## 2014-07-20 MED ORDER — TECHNETIUM TC 99M SESTAMIBI GENERIC - CARDIOLITE
10.0000 | Freq: Once | INTRAVENOUS | Status: AC | PRN
  Administered 2014-07-20: 10 via INTRAVENOUS

## 2014-07-20 MED ORDER — REGADENOSON 0.4 MG/5ML IV SOLN
INTRAVENOUS | Status: AC
  Filled 2014-07-20: qty 5

## 2014-07-20 MED ORDER — TECHNETIUM TC 99M SESTAMIBI - CARDIOLITE
30.0000 | Freq: Once | INTRAVENOUS | Status: AC | PRN
  Administered 2014-07-20: 30 via INTRAVENOUS

## 2014-07-20 MED ORDER — SODIUM CHLORIDE 0.9 % IJ SOLN
10.0000 mL | INTRAMUSCULAR | Status: DC | PRN
  Administered 2014-07-20: 10 mL via INTRAVENOUS
  Filled 2014-07-20: qty 10

## 2014-07-20 MED ORDER — SODIUM CHLORIDE 0.9 % IJ SOLN
INTRAMUSCULAR | Status: AC
Start: 2014-07-20 — End: 2014-07-20
  Administered 2014-07-20: 10 mL via INTRAVENOUS
  Filled 2014-07-20: qty 10

## 2014-07-20 MED ORDER — REGADENOSON 0.4 MG/5ML IV SOLN: 0.4000 mg | Freq: Once | INTRAVENOUS | Status: DC | PRN

## 2014-07-20 MED ORDER — SODIUM CHLORIDE 0.9 % IJ SOLN
INTRAMUSCULAR | Status: AC
  Filled 2014-07-20: qty 36

## 2014-07-20 NOTE — Telephone Encounter (Signed)
Pt made aware, forwarded to Dr. Hall 

## 2014-07-20 NOTE — Progress Notes (Signed)
Stress Lab Nurses Notes - Marvin Carr  Marvin Carr 07/20/2014 Reason for doing test: CAD and Chest Pain Type of test: Stress Cardiolite Nurse performing test: Gerrit Halls, RN Nuclear Medicine Tech: Redmond Baseman Echo Tech: Not Applicable MD performing test: McDowell/K.Purcell Nails NP Family MD: Nevada Crane Test explained and consent signed: Yes.   IV started: Saline lock flushed, No redness or edema and Saline lock started in radiology Symptoms: SOB & discomfort in leg & hip Treatment/Intervention: None Reason test stopped: fatigue and reached target HR After recovery IV was: Discontinued via X-ray tech and No redness or edema Patient to return to Nuc. Med at : 11:15 Patient discharged: Home Patient's Condition upon discharge was: stable Comments: During test peak BP 179/89 & HR 151.  Recovery BP 159/91 & HR 87.  Symptoms resolved in recovery.  [name] T

## 2014-07-22 LAB — HEPATIC FUNCTION PANEL
ALT: 27 U/L (ref 0–53)
AST: 21 U/L (ref 0–37)
Albumin: 3.9 g/dL (ref 3.5–5.2)
Alkaline Phosphatase: 58 U/L (ref 39–117)
BILIRUBIN DIRECT: 0.2 mg/dL (ref 0.0–0.3)
Indirect Bilirubin: 0.7 mg/dL (ref 0.2–1.2)
TOTAL PROTEIN: 6.1 g/dL (ref 6.0–8.3)
Total Bilirubin: 0.9 mg/dL (ref 0.2–1.2)

## 2014-07-22 LAB — LIPID PANEL
Cholesterol: 121 mg/dL (ref 0–200)
HDL: 35 mg/dL — ABNORMAL LOW (ref >39)
LDL Cholesterol: 52 mg/dL (ref 0–99)
TRIGLYCERIDES: 169 mg/dL — AB (ref <150)
Total CHOL/HDL Ratio: 3.5 Ratio
VLDL: 34 mg/dL (ref 0–40)

## 2014-07-31 ENCOUNTER — Other Ambulatory Visit: Payer: Self-pay | Admitting: *Deleted

## 2014-07-31 MED ORDER — NITROGLYCERIN 0.4 MG SL SUBL: 0.4000 mg | SUBLINGUAL_TABLET | SUBLINGUAL | Status: DC | PRN

## 2014-08-20 ENCOUNTER — Encounter (HOSPITAL_COMMUNITY): Payer: Self-pay | Admitting: Cardiology

## 2015-02-04 ENCOUNTER — Other Ambulatory Visit: Payer: Self-pay | Admitting: Adult Health

## 2015-09-17 ENCOUNTER — Other Ambulatory Visit (HOSPITAL_COMMUNITY): Payer: Self-pay | Admitting: Internal Medicine

## 2015-09-17 ENCOUNTER — Ambulatory Visit (HOSPITAL_COMMUNITY)
Admission: RE | Admit: 2015-09-17 | Discharge: 2015-09-17 | Disposition: A | Discharge status: Home / Self Care | Payer: BLUE CROSS/BLUE SHIELD | Source: Ambulatory Visit | Attending: Internal Medicine | Admitting: Internal Medicine

## 2015-09-17 DIAGNOSIS — M19011 Primary osteoarthritis, right shoulder: Secondary | ICD-10-CM | POA: Diagnosis not present

## 2015-09-17 DIAGNOSIS — M25511 Pain in right shoulder: Secondary | ICD-10-CM | POA: Diagnosis present

## 2015-09-28 ENCOUNTER — Ambulatory Visit (INDEPENDENT_AMBULATORY_CARE_PROVIDER_SITE_OTHER): Payer: BLUE CROSS/BLUE SHIELD | Admitting: Cardiology

## 2015-09-28 ENCOUNTER — Encounter: Payer: Self-pay | Admitting: Cardiology

## 2015-09-28 VITALS — BP 142/88 | HR 79 | Ht 72.0 in | Wt 220.0 lb

## 2015-09-28 DIAGNOSIS — I10 Essential (primary) hypertension: Secondary | ICD-10-CM

## 2015-09-28 DIAGNOSIS — E785 Hyperlipidemia, unspecified: Secondary | ICD-10-CM | POA: Diagnosis not present

## 2015-09-28 DIAGNOSIS — I2510 Atherosclerotic heart disease of native coronary artery without angina pectoris: Secondary | ICD-10-CM | POA: Diagnosis not present

## 2015-09-28 NOTE — Progress Notes (Signed)
Cardiology Office Note  Date: 09/28/2015   ID: Marvin Carr, DOB 08-29-1956, MRN [id number]  PCP: Marvin Neighbors, MD  Primary Cardiologist: Marvin Lesches, MD   Chief Complaint  Patient presents with  . Coronary Artery Disease    History of Present Illness: Marvin Carr is a 60 y.o. male last seen in October 2015. He presents for a routine follow-up visit. He does not report any angina symptoms or shortness of breath beyond NYHA class II with typical activities. 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 He has not been exercising as regularly. We discussed this today.  ECG shows sinus rhythm with nonspecific T-wave changes and Q waves in leads III and aVF. Follow-up stress testing from November 2015 was low risk.  We discussed his medications. He continues on aspirin, Lipitor, lisinopril, and has nitroglycerin available. He follows with Dr. Nevada Carr for physicals with lab work.  Past Medical History  Diagnosis Date  . Essential hypertension   . Seasonal allergies   . Headache(784.0)     History of migraines  . Anxiety   . Bladder cancer ([place]) 2011  . Sleep apnea     On CPAP  . CAD (coronary artery disease), native coronary artery     a. Mildly elevated troponin 03/2013, cath with nonobstructive disease including 50% AV groove distal stenosis before large OM  . Hyperglycemia   . Mixed hyperlipidemia   . Obesity   . Neuropathy [facility])     Current Outpatient Prescriptions  Medication Sig Dispense Refill  . aspirin EC 81 MG tablet Take 81 mg by mouth daily.    Marland Kitchen atorvastatin (LIPITOR) 20 MG tablet Take 20 mg by mouth daily.    Marland Kitchen buPROPion (WELLBUTRIN XL) 300 MG 24 hr tablet Take 300 mg by mouth daily.    . cetirizine (ZYRTEC) 10 MG tablet Take 10 mg by mouth daily as needed for allergies.    Marland Kitchen eszopiclone (LUNESTA) 2 MG TABS tablet Take 2 mg by mouth at bedtime as needed for sleep. Take immediately before bedtime    . lisinopril (PRINIVIL,ZESTRIL) 10  MG tablet Take 10 mg by mouth daily.    . pramipexole (MIRAPEX) 0.5 MG tablet Take 1 mg by mouth daily.     . traMADol (ULTRAM) 50 MG tablet Take 50 mg by mouth daily as needed for moderate pain.     Marland Kitchen HYDROcodone-acetaminophen (NORCO/VICODIN) 5-325 MG per tablet Take 1-2 tablets by mouth every 4 (four) hours as needed for moderate pain. Reported on 09/28/2015    . nitroGLYCERIN (NITROSTAT) 0.4 MG SL tablet Place 1 tablet (0.4 mg total) under the tongue every 5 (five) minutes as needed for chest pain. 25 tablet 3   No current facility-administered medications for this visit.   Allergies:  Review of patient's allergies indicates no known allergies.   Social History: The patient  reports that he quit smoking about 4 years ago. His smoking use included Cigarettes. He has a 5 pack-year smoking history. He has never used smokeless tobacco. He reports that he drinks alcohol. He reports that he does not use illicit drugs.   ROS:  Please see the history of present illness. Otherwise, complete review of systems is positive for right shoulder discomfort.  All other systems are reviewed and negative.   Physical Exam: VS:  BP 142/88 mmHg  Pulse 79  Ht 6' (1.829 m)  Wt 220 lb (99.791 kg)  BMI 29.83 kg/m2  SpO2 97%, BMI Body mass index is  29.83 kg/(m^2).  Wt Readings from Last 3 Encounters:  09/28/15 220 lb (99.791 kg)  07/09/14 211 lb (95.709 kg)  06/11/14 210 lb (95.255 kg)    General: Patient appears comfortable at rest. HEENT: Conjunctiva and lids normal, oropharynx clear with moist mucosa. Neck: Supple, no elevated JVP or carotid bruits, no thyromegaly. Lungs: Clear to auscultation, nonlabored breathing at rest. Cardiac: Regular rate and rhythm, no S3 or significant systolic murmur, no pericardial rub. Abdomen: Soft, nontender, no hepatomegaly, bowel sounds present, no guarding or rebound. Extremities: No pitting edema, distal pulses 2+.  ECG: ECG is ordered today.  Recent Labwork: No  results found for requested labs within last 365 days.     Component Value Date/Time   CHOL 121 07/22/2014 0846   TRIG 169* 07/22/2014 0846   HDL 35* 07/22/2014 0846   CHOLHDL 3.5 07/22/2014 0846   VLDL 34 07/22/2014 0846   LDLCALC 52 07/22/2014 0846    Other Studies Reviewed Today:  Exercise Cardiolite 07/20/2014: FINDINGS: Patient exercised on Bruce protocol for 10 min and 1 second achieving a maximum work load of 13.3 METS. Peak heart rate was 151 beats per min which was 93% of the maximal age predicted heart rate. Peak blood pressure was 179/89. No chest pain was reported. There were no diagnostic ST segment abnormalities, and no significant arrhythmias were noted. Low risk Duke treadmill score 10.  Analysis of the overall perfusion data finds adequate radiotracer uptake within the myocardium.  Perfusion: Small, mild intensity, mid to basal inferior defect that is fixed, more prominent at rest than stress, and most consistent with soft tissue attenuation rather than scar. No definitive regions of ischemia noted.  Wall Motion: Normal left ventricular wall motion. No left ventricular dilation.  Left Ventricular Ejection Fraction: 72 %  End diastolic volume 82 ml  End systolic volume 23 ml  TID ratio 0.94.  IMPRESSION: 1. No clear evidence of ischemia with soft tissue attenuation as noted above.  2. Normal left ventricular wall motion.  3. Left ventricular ejection fraction 72%  4. Low-risk stress test findings*. Duke treadmill score low at 10.  Assessment and Plan:  1. History of nonobstructive CAD as outlined above, plan to continue medical therapy and observation. He underwent low risk stress testing within the last 2 years.  2. Essential hypertension, blood pressure is mildly elevated today. Discussed weight loss and exercise.  3. Hyperlipidemia on statin therapy. Keep follow-up with Dr. Nevada Carr.  Current medicines were reviewed with the patient  today.   Orders Placed This Encounter  Procedures  . EKG 12-Lead    Disposition: FU with me in 1 year.   Signed, Marvin Sark, MD, [facility] 09/28/2015 11:42 AM    [facility] [place]. [address] [place], [place] [address] Phone: [phone]; Fax: [phone]

## 2015-09-28 NOTE — Patient Instructions (Signed)
Medication Instructions:  Your physician recommends that you continue on your current medications as directed. Please refer to the Current Medication list given to you today.  Labwork: none  Testing/Procedures: none  Follow-Up: Your physician wants you to follow-up in: 1 year with Dr. McDowell You will receive a reminder letter in the mail two months in advance. If you don't receive a letter, please call our office to schedule the follow-up appointment.  Any Other Special Instructions Will Be Listed Below (If Applicable).  If you need a refill on your cardiac medications before your next appointment, please call your pharmacy. 

## 2015-10-05 ENCOUNTER — Ambulatory Visit (INDEPENDENT_AMBULATORY_CARE_PROVIDER_SITE_OTHER): Payer: BLUE CROSS/BLUE SHIELD | Admitting: Orthopedic Surgery

## 2015-10-05 VITALS — BP 126/71 | Ht 72.0 in | Wt 220.0 lb

## 2015-10-05 DIAGNOSIS — M75101 Unspecified rotator cuff tear or rupture of right shoulder, not specified as traumatic: Secondary | ICD-10-CM | POA: Diagnosis not present

## 2015-10-05 NOTE — Progress Notes (Signed)
Patient ID: Marvin Carr, male   DOB: 20-Jan-1956, 60 y.o.   MRN: [id number]  Chief Complaint  Patient presents with  . Shoulder Pain    Right shoulder pain, referred by Dr. Nevada Crane for eval and treat.     Marvin Carr is a 60 y.o. , malewho now presents with right shoulder pain  6 month history of right shoulder pain no injury. Pain is sharp radiating from his shoulder down into his right forearm but not into the hand. The pain is positional such as taking off a T-shirt and is not constant but does feel it at rest depending on arm position. Pain intensity which is 8 out of 10. X-rays of been done patient has been treated with tramadol and diclofenac without relief. No positions or treatments have made it better but he does note increased pain with movement Review of Systems Review of Systems  Chest pain back pain stiff joints joint pain limb pain hayfever seasonal allergy burning pain in the legs tingling ringing in the ears signs problems dental problems  Past Medical History  Diagnosis Date  . Essential hypertension   . Seasonal allergies   . Headache(784.0)     History of migraines  . Anxiety   . Bladder cancer ([place]) 2011  . Sleep apnea     On CPAP  . CAD (coronary artery disease), native coronary artery     a. Mildly elevated troponin 03/2013, cath with nonobstructive disease including 50% AV groove distal stenosis before large OM  . Hyperglycemia   . Mixed hyperlipidemia   . Obesity   . Neuropathy [facility])     Past Surgical History  Procedure Laterality Date  . [place]  2011  . Colonoscopy  06/28/2011    Procedure: COLONOSCOPY;  Surgeon: Marvin Houston, MD;  [facility];  Service: Endoscopy;  Laterality: N/A;  . Left heart catheterization with coronary angiogram N/A 03/31/2013    Procedure: LEFT HEART CATHETERIZATION WITH CORONARY ANGIOGRAM;  Surgeon: Marvin Breeding, MD;  Location: [facility] CATH LAB;  Service: Cardiovascular;  Laterality: N/A;    Family History   Problem Relation Age of Onset  . COPD Father     Social History Social History  Substance Use Topics  . Smoking status: Former Smoker -- 0.50 packs/day for 10 years    Types: Cigarettes    Quit date: 07/10/2011  . Smokeless tobacco: Never Used     Comment: Quit several yrs prior to 03/2013.  Marland Kitchen Alcohol Use: 0.0 oz/week    0 Standard drinks or equivalent per week     Comment: Occasional    No Known Allergies  Current Outpatient Prescriptions  Medication Sig Dispense Refill  . aspirin EC 81 MG tablet Take 81 mg by mouth daily.    Marland Kitchen atorvastatin (LIPITOR) 20 MG tablet Take 20 mg by mouth daily.    Marland Kitchen buPROPion (WELLBUTRIN XL) 300 MG 24 hr tablet Take 300 mg by mouth daily.    . cetirizine (ZYRTEC) 10 MG tablet Take 10 mg by mouth daily as needed for allergies.    Marland Kitchen eszopiclone (LUNESTA) 2 MG TABS tablet Take 2 mg by mouth at bedtime as needed for sleep. Take immediately before bedtime    . HYDROcodone-acetaminophen (NORCO/VICODIN) 5-325 MG per tablet Take 1-2 tablets by mouth every 4 (four) hours as needed for moderate pain. Reported on 09/28/2015    . lisinopril (PRINIVIL,ZESTRIL) 10 MG tablet Take 10 mg by mouth daily.    . nitroGLYCERIN (NITROSTAT) 0.4 MG  SL tablet Place 1 tablet (0.4 mg total) under the tongue every 5 (five) minutes as needed for chest pain. 25 tablet 3  . pramipexole (MIRAPEX) 0.5 MG tablet Take 1 mg by mouth daily.     . traMADol (ULTRAM) 50 MG tablet Take 50 mg by mouth daily as needed for moderate pain.      No current facility-administered medications for this visit.       Physical Exam Blood pressure 126/71, height 6' (1.829 m), weight 220 lb (99.791 kg). Physical Exam  Objective:     The patient is awake alert and oriented 3 her mood and affect are normal. She exhibits normal grooming and hygiene without gross deformity.  Gait is normal the noncontributory  On inspection of the right shoulder we find tenderness in the peri-acromial region. The  patient exhibits decreased range of motion with forward elevation in the scapular plane. We also find a loss of internal rotation  The patient is stable in abduction external rotation  The internal and external rotators have normal strength we find mild weakness in the right rotator cuff supraspinatus tendon with the empty can sign  The skin is warm dry and intact without erythema laceration or previous incision.  Sensation remains normal and the patient has a normal pulse with good perfusion and a warm extremity to touch  Cervical spine is nontender with full range of motion  Comparison left shoulder examination reveals a normal range of motion normal strength no instability and no swelling    Data Reviewed My interpretation of the xrays: Mild proximal migration humeral head and break in [place]ton's line of the shoulder mild undersurface acromial sclerosis and greater tuberosity sclerosis. I disagree that there is excessive arthritis of the before meals joint.    Assessment Rotator cuff syndrome right shoulder Plan Subacromial injection follow-up as needed   Procedure note the subacromial injection shoulder RIGHT  Verbal consent was obtained to inject the  RIGHT   Shoulder  Timeout was completed to confirm the injection site is a subacromial space of the  RIGHT  shoulder   Medication used Depo-Medrol 40 mg and lidocaine 1% 3 cc  Anesthesia was provided by ethyl chloride  The injection was performed in the RIGHT  posterior subacromial space. After pinning the skin with alcohol and anesthetized the skin with ethyl chloride the subacromial space was injected using a 20-gauge needle. There were no complications  Sterile dressing was applied.

## 2015-10-05 NOTE — Patient Instructions (Signed)
Joint Injection  Care After  Refer to this sheet in the next few days. These instructions provide you with information on caring for yourself after you have had a joint injection. Your caregiver also may give you more specific instructions. Your treatment has been planned according to current medical practices, but problems sometimes occur. Call your caregiver if you have any problems or questions after your procedure.  After any type of joint injection, it is not uncommon to experience:  · Soreness, swelling, or bruising around the injection site.  · Mild numbness, tingling, or weakness around the injection site caused by the numbing medicine used before or with the injection.  It also is possible to experience the following effects associated with the specific agent after injection:  · Iodine-based contrast agents:  ¨ Allergic reaction (itching, hives, widespread redness, and swelling beyond the injection site).  · Corticosteroids (These effects are rare.):  ¨ Allergic reaction.  ¨ Increased blood sugar levels (If you have diabetes and you notice that your blood sugar levels have increased, notify your caregiver).  ¨ Increased blood pressure levels.  ¨ Mood swings.  · Hyaluronic acid in the use of viscosupplementation.  ¨ Temporary heat or redness.  ¨ Temporary rash and itching.  ¨ Increased fluid accumulation in the injected joint.  These effects all should resolve within a day after your procedure.   HOME CARE INSTRUCTIONS  · Limit yourself to light activity the day of your procedure. Avoid lifting heavy objects, bending, stooping, or twisting.  · Take prescription or over-the-counter pain medication as directed by your caregiver.  · You may apply ice to your injection site to reduce pain and swelling the day of your procedure. Ice may be applied 03-04 times:  ¨ Put ice in a plastic bag.  ¨ Place a towel between your skin and the bag.  ¨ Leave the ice on for no longer than 15-20 minutes each time.  SEEK  IMMEDIATE MEDICAL CARE IF:   · Pain and swelling get worse rather than better or extend beyond the injection site.  · Numbness does not go away.  · Blood or fluid continues to leak from the injection site.  · You have chest pain.  · You have swelling of your face or tongue.  · You have trouble breathing or you become dizzy.  · You develop a fever, chills, or severe tenderness at the injection site that last longer than 1 day.  MAKE SURE YOU:  · Understand these instructions.  · Watch your condition.  · Get help right away if you are not doing well or if you get worse.  Document Released: 05/11/2011 Document Revised: 11/20/2011 Document Reviewed: 05/11/2011  ExitCare® Patient Information ©2015 [employer]. This information is not intended to replace advice given to you by your health care provider. Make sure you discuss any questions you have with your health care provider.

## 2015-10-08 ENCOUNTER — Other Ambulatory Visit: Payer: Self-pay | Admitting: Cardiology

## 2015-10-13 ENCOUNTER — Ambulatory Visit (HOSPITAL_COMMUNITY)
Admission: RE | Admit: 2015-10-13 | Discharge: 2015-10-13 | Disposition: A | Discharge status: Home / Self Care | Payer: BLUE CROSS/BLUE SHIELD | Source: Ambulatory Visit | Attending: Internal Medicine | Admitting: Internal Medicine

## 2015-10-13 ENCOUNTER — Other Ambulatory Visit (HOSPITAL_COMMUNITY): Payer: Self-pay | Admitting: Internal Medicine

## 2015-10-13 DIAGNOSIS — R079 Chest pain, unspecified: Secondary | ICD-10-CM | POA: Diagnosis present

## 2015-10-13 DIAGNOSIS — R0789 Other chest pain: Secondary | ICD-10-CM

## 2015-10-13 DIAGNOSIS — R0781 Pleurodynia: Secondary | ICD-10-CM

## 2017-02-21 NOTE — Progress Notes (Signed)
Cardiology Office Note  Date: 02/22/2017   ID: Marvin Carr, DOB [birth year], MRN [id number]  PCP: Celene Squibb, MD  Primary Cardiologist: Rozann Lesches, MD   Chief Complaint  Patient presents with  . Coronary Artery Disease    History of Present Illness: Marvin Carr is a 61 y.o. male last seen in January 2017. He presents for a routine follow-up visit. States that over the last year he has had somewhat less energy, occasionally has intermittent chest discomfort but not on a predictable basis. He has not used nitroglycerin. He also tells me that he may be in need of right hip replacement. He is following with an orthopedist in [place] and had a recent injection for pain control.  Last ischemic testing was in 2015. We discussed obtaining a follow-up ischemic testing light of his intermittent chest pain symptoms and also the possibility that he may be undergoing surgery.  He continues to follow with Dr. Nevada Crane. Reports no major changes in his medications. He does need a refill for lisinopril.  I personally reviewed his ECG today which shows normal sinus rhythm.  Past Medical History:  Diagnosis Date  . Anxiety   . Bladder cancer ([place]) 2011  . CAD (coronary artery disease), native coronary artery    a. Mildly elevated troponin 03/2013, cath with nonobstructive disease including 50% AV groove distal stenosis before large OM  . Essential hypertension   . Headache(784.0)    History of migraines  . Hyperglycemia   . Mixed hyperlipidemia   . Neuropathy   . Obesity   . Seasonal allergies   . Sleep apnea    On CPAP    Past Surgical History:  Procedure Laterality Date  . COLONOSCOPY  06/28/2011   Procedure: COLONOSCOPY;  Surgeon: Rogene Houston, MD;  [facility];  Service: Endoscopy;  Laterality: N/A;  . LEFT HEART CATHETERIZATION WITH CORONARY ANGIOGRAM N/A 03/31/2013   Procedure: LEFT HEART CATHETERIZATION WITH CORONARY ANGIOGRAM;  Surgeon: Minus Breeding,  MD;  Location: [facility] CATH LAB;  Service: Cardiovascular;  Laterality: N/A;  . TURBT  2011    Current Outpatient Prescriptions  Medication Sig Dispense Refill  . aspirin EC 81 MG tablet Take 81 mg by mouth daily.    . cetirizine (ZYRTEC) 10 MG tablet Take 10 mg by mouth daily as needed for allergies.    Marland Kitchen diclofenac (VOLTAREN) 75 MG EC tablet Take 75 mg by mouth 2 (two) times daily.    . eszopiclone (LUNESTA) 2 MG TABS tablet Take 2 mg by mouth at bedtime as needed for sleep. Take immediately before bedtime    . nitroGLYCERIN (NITROSTAT) 0.4 MG SL tablet Place 1 tablet (0.4 mg total) under the tongue every 5 (five) minutes as needed for chest pain. 25 tablet 3  . pramipexole (MIRAPEX) 0.5 MG tablet Take 0.5 mg by mouth 3 (three) times daily.     . traMADol (ULTRAM) 50 MG tablet Take 50 mg by mouth daily as needed for moderate pain.     Marland Kitchen atorvastatin (LIPITOR) 20 MG tablet Take 20 mg by mouth daily.    Marland Kitchen lisinopril (PRINIVIL,ZESTRIL) 10 MG tablet Take 1 tablet (10 mg total) by mouth daily. 30 tablet 6   No current facility-administered medications for this visit.    Allergies:  Patient has no known allergies.   Social History: The patient  reports that he quit smoking about 5 years ago. His smoking use included Cigarettes. He has a 5.00 pack-year smoking history. He  has never used smokeless tobacco. He reports that he drinks alcohol. He reports that he does not use drugs.   ROS:  Please see the history of present illness. Otherwise, complete review of systems is positive for right hip discomfort.  All other systems are reviewed and negative.   Physical Exam: VS:  BP (!) 143/98   Pulse 71   Ht 6' (1.829 m)   Wt 235 lb 12.8 oz (107 kg)   SpO2 98%   BMI 31.98 kg/m , BMI Body mass index is 31.98 kg/m.  Wt Readings from Last 3 Encounters:  02/22/17 235 lb 12.8 oz (107 kg)  10/05/15 220 lb (99.8 kg)  09/28/15 220 lb (99.8 kg)    General: Overweight male, appears comfortable at  rest. HEENT: Conjunctiva and lids normal, oropharynx clear with moist mucosa. Neck: Supple, no elevated JVP or carotid bruits, no thyromegaly. Lungs: Clear to auscultation, nonlabored breathing at rest. Cardiac: Regular rate and rhythm, no S3 or significant systolic murmur, no pericardial rub. Abdomen: Soft, nontender, no hepatomegaly, bowel sounds present, no guarding or rebound. Extremities: No pitting edema, distal pulses 2+. Skin: Warm and dry. [place] skeletal: No kyphosis. Neuropsychiatric: Alert and oriented 3, affect appropriate.  ECG: I personally reviewed the tracing from 09/28/2015 which shows sinus rhythm.  Recent Labwork:    Component Value Date/Time   CHOL 121 07/22/2014 0846   TRIG 169 (H) 07/22/2014 0846   HDL 35 (L) 07/22/2014 0846   CHOLHDL 3.5 07/22/2014 0846   VLDL 34 07/22/2014 0846   LDLCALC 52 07/22/2014 0846    Other Studies Reviewed Today:  Exercise Cardiolite 07/20/2014: FINDINGS: Patient exercised on Bruce protocol for 10 min and 1 second achieving a maximum work load of 13.3 METS. Peak heart rate was 151 beats per min which was 93% of the maximal age predicted heart rate. Peak blood pressure was 179/89. No chest pain was reported. There were no diagnostic ST segment abnormalities, and no significant arrhythmias were noted. Low risk Duke treadmill score 10.  Analysis of the overall perfusion data finds adequate radiotracer uptake within the myocardium.  Perfusion: Small, mild intensity, mid to basal inferior defect that is fixed, more prominent at rest than stress, and most consistent with soft tissue attenuation rather than scar. No definitive regions of ischemia noted.  Wall Motion: Normal left ventricular wall motion. No left ventricular dilation.  Left Ventricular Ejection Fraction: 72 %  End diastolic volume 82 ml  End systolic volume 23 ml  TID ratio 0.94.  IMPRESSION: 1. No clear evidence of ischemia with soft tissue  attenuation as noted above.  2. Normal left ventricular wall motion.  3. Left ventricular ejection fraction 72%  4. Low-risk stress test findings*. Duke treadmill score low at 10.  Assessment and Plan:  1. History of nonobstructive CAD as outlined above, last ischemic testing was in 2015 and overall low risk. He reports decreased energy over the last year with some intermittent chest discomfort although not predictable. He also may be in need of right hip replacement surgery. Baseline ECG is normal today. We discussed obtaining a follow-up exercise Myoview to reassess ischemic burden. He feels like he should be able to walk on the treadmill, if hip becomes painful can convert to [employer].  2. Essential hypertension, recently ran out of lisinopril which will be refilled.  3. Hyperlipidemia, on Lipitor. He follows with Dr. Nevada Crane.  4. Obstructive sleep apnea on CPAP. He reports compliance.  Current medicines were reviewed with the patient today.  Orders Placed This Encounter  Procedures  . NM Myocar Multi W/Spect W/Wall Motion / EF  . EKG 12-Lead    Disposition: Follow-up in one year, sooner if needed.  Signed, Satira Sark, MD, [facility] 02/22/2017 1:59 PM    [facility] [facility] [place], [place], [place] [address] Phone: [phone]; Fax: [phone]

## 2017-02-22 ENCOUNTER — Ambulatory Visit (INDEPENDENT_AMBULATORY_CARE_PROVIDER_SITE_OTHER): Payer: PRIVATE HEALTH INSURANCE | Admitting: Cardiology

## 2017-02-22 ENCOUNTER — Telehealth: Payer: Self-pay | Admitting: Cardiology

## 2017-02-22 ENCOUNTER — Encounter: Payer: Self-pay | Admitting: Cardiology

## 2017-02-22 VITALS — BP 143/98 | HR 71 | Ht 72.0 in | Wt 235.8 lb

## 2017-02-22 DIAGNOSIS — I10 Essential (primary) hypertension: Secondary | ICD-10-CM | POA: Diagnosis not present

## 2017-02-22 DIAGNOSIS — Z9989 Dependence on other enabling machines and devices: Secondary | ICD-10-CM | POA: Diagnosis not present

## 2017-02-22 DIAGNOSIS — G4733 Obstructive sleep apnea (adult) (pediatric): Secondary | ICD-10-CM | POA: Diagnosis not present

## 2017-02-22 DIAGNOSIS — I25119 Atherosclerotic heart disease of native coronary artery with unspecified angina pectoris: Secondary | ICD-10-CM | POA: Diagnosis not present

## 2017-02-22 DIAGNOSIS — E782 Mixed hyperlipidemia: Secondary | ICD-10-CM | POA: Diagnosis not present

## 2017-02-22 MED ORDER — LISINOPRIL 10 MG PO TABS: 10.0000 mg | ORAL_TABLET | Freq: Every day | ORAL | 6 refills | Status: DC

## 2017-02-22 NOTE — Patient Instructions (Signed)
Medication Instructions:  Your physician recommends that you continue on your current medications as directed. Please refer to the Current Medication list given to you today.  Labwork: NONE  Testing/Procedures: Your physician has requested that you have en exercise stress myoview. For further information please visit [email or web address]. Please follow instruction sheet, as given.  Follow-Up: Your physician wants you to follow-up in: 1 YEAR WITH DR. MCDOWELL. You will receive a reminder letter in the mail two months in advance. If you don't receive a letter, please call our office to schedule the follow-up appointment.  Any Other Special Instructions Will Be Listed Below (If Applicable).  If you need a refill on your cardiac medications before your next appointment, please call your pharmacy. 

## 2017-02-22 NOTE — Telephone Encounter (Signed)
SCHEDULE at [facility] March 05, 2017 arrive at 845am

## 2017-03-05 ENCOUNTER — Inpatient Hospital Stay (HOSPITAL_COMMUNITY): Admission: RE | Admit: 2017-03-05 | Payer: PRIVATE HEALTH INSURANCE | Source: Ambulatory Visit

## 2017-03-05 ENCOUNTER — Encounter (HOSPITAL_COMMUNITY)
Admission: RE | Admit: 2017-03-05 | Discharge: 2017-03-05 | Disposition: A | Discharge status: Home / Self Care | Payer: PRIVATE HEALTH INSURANCE | Source: Ambulatory Visit | Attending: Cardiology | Admitting: Cardiology

## 2017-03-05 ENCOUNTER — Encounter (HOSPITAL_COMMUNITY): Payer: Self-pay

## 2017-03-05 ENCOUNTER — Telehealth: Payer: Self-pay

## 2017-03-05 DIAGNOSIS — I25119 Atherosclerotic heart disease of native coronary artery with unspecified angina pectoris: Secondary | ICD-10-CM | POA: Insufficient documentation

## 2017-03-05 LAB — NM MYOCAR MULTI W/SPECT W/WALL MOTION / EF
CSEPEW: 13 METS
Exercise duration (min): 10 min
Exercise duration (sec): 21 s
LV dias vol: 96 mL (ref 62–150)
LV sys vol: 33 mL
MPHR: 159 {beats}/min
NUC STRESS TID: 1.05
Peak HR: 151 {beats}/min
Percent HR: 94 %
RATE: 0.41
RPE: 17
Rest HR: 67 {beats}/min
SDS: 0
SRS: 0
SSS: 0

## 2017-03-05 MED ORDER — TECHNETIUM TC 99M TETROFOSMIN IV KIT
10.0000 | PACK | Freq: Once | INTRAVENOUS | Status: AC | PRN
  Administered 2017-03-05: 10 via INTRAVENOUS

## 2017-03-05 MED ORDER — REGADENOSON 0.4 MG/5ML IV SOLN
INTRAVENOUS | Status: AC
  Filled 2017-03-05: qty 5

## 2017-03-05 MED ORDER — SODIUM CHLORIDE 0.9% FLUSH
INTRAVENOUS | Status: AC
  Administered 2017-03-05: 10 mL via INTRAVENOUS
  Filled 2017-03-05: qty 10

## 2017-03-05 MED ORDER — TECHNETIUM TC 99M TETROFOSMIN IV KIT
30.0000 | PACK | Freq: Once | INTRAVENOUS | Status: AC | PRN
  Administered 2017-03-05: 30 via INTRAVENOUS

## 2017-03-05 NOTE — Telephone Encounter (Signed)
-----   Message from Rogelia Mire, NP sent at 03/05/2017  3:36 PM EDT ----- Low risk stress test.  Very good exercise tolerance.  Good blood flow to the heart muscle, w/o suggestion of heart blockage.  Very reassuring.

## 2017-03-05 NOTE — Telephone Encounter (Signed)
Patient notified. Routed to PCP 

## 2017-03-09 ENCOUNTER — Encounter: Payer: Self-pay | Admitting: Cardiology

## 2017-04-16 ENCOUNTER — Other Ambulatory Visit (HOSPITAL_COMMUNITY): Payer: Self-pay | Admitting: Internal Medicine

## 2017-04-16 DIAGNOSIS — R198 Other specified symptoms and signs involving the digestive system and abdomen: Secondary | ICD-10-CM

## 2017-04-16 DIAGNOSIS — R1012 Left upper quadrant pain: Secondary | ICD-10-CM

## 2017-04-20 ENCOUNTER — Ambulatory Visit (HOSPITAL_COMMUNITY)
Admission: RE | Admit: 2017-04-20 | Discharge: 2017-04-20 | Disposition: A | Discharge status: Home / Self Care | Payer: Self-pay | Source: Ambulatory Visit | Attending: Internal Medicine | Admitting: Internal Medicine

## 2017-04-20 DIAGNOSIS — R198 Other specified symptoms and signs involving the digestive system and abdomen: Secondary | ICD-10-CM

## 2017-04-20 DIAGNOSIS — K769 Liver disease, unspecified: Secondary | ICD-10-CM | POA: Insufficient documentation

## 2017-04-20 DIAGNOSIS — K8020 Calculus of gallbladder without cholecystitis without obstruction: Secondary | ICD-10-CM | POA: Insufficient documentation

## 2017-04-20 DIAGNOSIS — R1012 Left upper quadrant pain: Secondary | ICD-10-CM

## 2018-09-26 ENCOUNTER — Encounter: Payer: Self-pay | Admitting: Cardiology

## 2018-09-26 ENCOUNTER — Ambulatory Visit (INDEPENDENT_AMBULATORY_CARE_PROVIDER_SITE_OTHER): Payer: Self-pay | Admitting: Cardiology

## 2018-09-26 VITALS — BP 126/74 | HR 83 | Ht 72.0 in | Wt 242.0 lb

## 2018-09-26 DIAGNOSIS — I10 Essential (primary) hypertension: Secondary | ICD-10-CM

## 2018-09-26 DIAGNOSIS — G4733 Obstructive sleep apnea (adult) (pediatric): Secondary | ICD-10-CM

## 2018-09-26 DIAGNOSIS — I25119 Atherosclerotic heart disease of native coronary artery with unspecified angina pectoris: Secondary | ICD-10-CM

## 2018-09-26 DIAGNOSIS — E782 Mixed hyperlipidemia: Secondary | ICD-10-CM

## 2018-09-26 DIAGNOSIS — Z9989 Dependence on other enabling machines and devices: Secondary | ICD-10-CM

## 2018-09-26 MED ORDER — NITROGLYCERIN 0.4 MG SL SUBL: 0.4000 mg | SUBLINGUAL_TABLET | SUBLINGUAL | 3 refills | Status: DC | PRN

## 2018-09-26 NOTE — Progress Notes (Signed)
Cardiology Office Note  Date: 09/26/2018   ID: Marvin Carr, DOB 12/02/1955, MRN [id number]  PCP: Celene Squibb, MD  Primary Cardiologist: Rozann Lesches, MD   Chief Complaint  Patient presents with  . Coronary Artery Disease    History of Present Illness: Marvin Carr is a 63 y.o. male last seen in June 2018.  He presents for a routine follow-up visit.  He does not report any angina symptoms or nitroglycerin use, stable NYHA class II dyspnea.  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  I reviewed his medications which are outlined below.  He reports no intolerances.  He had lab work done for physical with Dr. Nevada Carr which we are requesting.  Exercise Myoview in June 2018 was low risk with no active ischemia.  I personally reviewed his ECG today which shows normal sinus rhythm.  Past Medical History:  Diagnosis Date  . Anxiety   . Bladder cancer ([place]) 2011  . CAD (coronary artery disease), native coronary artery    a. Mildly elevated troponin 03/2013, cath with nonobstructive disease including 50% AV groove distal stenosis before large OM  . Essential hypertension   . Headache(784.0)    History of migraines  . Hyperglycemia   . Mixed hyperlipidemia   . Neuropathy   . Obesity   . Seasonal allergies   . Sleep apnea    On CPAP    Past Surgical History:  Procedure Laterality Date  . COLONOSCOPY  06/28/2011   Procedure: COLONOSCOPY;  Surgeon: Rogene Houston, MD;  [facility];  Service: Endoscopy;  Laterality: N/A;  . LEFT HEART CATHETERIZATION WITH CORONARY ANGIOGRAM N/A 03/31/2013   Procedure: LEFT HEART CATHETERIZATION WITH CORONARY ANGIOGRAM;  Surgeon: Minus Breeding, MD;  Location: [facility] CATH LAB;  Service: Cardiovascular;  Laterality: N/A;  . TURBT  2011    Current Outpatient Medications  Medication Sig Dispense Refill  . aspirin EC 81 MG tablet Take 81 mg by mouth daily.    Marland Kitchen atorvastatin (LIPITOR) 20 MG tablet Take 20 mg by mouth daily.    .  diclofenac (VOLTAREN) 75 MG EC tablet Take 75 mg by mouth 2 (two) times daily.    . eszopiclone (LUNESTA) 2 MG TABS tablet Take 2 mg by mouth at bedtime as needed for sleep. Take immediately before bedtime    . lisinopril (PRINIVIL,ZESTRIL) 10 MG tablet Take 1 tablet (10 mg total) by mouth daily. 30 tablet 6  . pramipexole (MIRAPEX) 0.5 MG tablet Take 0.5 mg by mouth 3 (three) times daily.     . traMADol (ULTRAM) 50 MG tablet Take 50 mg by mouth daily as needed for moderate pain.     . nitroGLYCERIN (NITROSTAT) 0.4 MG SL tablet Place 1 tablet (0.4 mg total) under the tongue every 5 (five) minutes as needed for chest pain. 25 tablet 3   No current facility-administered medications for this visit.    Allergies:  Patient has no known allergies.   Social History: The patient  reports that he quit smoking about 7 years ago. His smoking use included cigarettes. He has a 5.00 pack-year smoking history. He has never used smokeless tobacco. He reports current alcohol use. He reports that he does not use drugs.   ROS:  Please see the history of present illness. Otherwise, complete review of systems is positive for hip pain.  All other systems are reviewed and negative.   Physical Exam: VS:  BP 126/74 (BP Location: Right Arm)   Pulse 83  Ht 6' (1.829 m)   Wt 242 lb (109.8 kg)   SpO2 96%   BMI 32.82 kg/m , BMI Body mass index is 32.82 kg/m.  Wt Readings from Last 3 Encounters:  09/26/18 242 lb (109.8 kg)  02/22/17 235 lb 12.8 oz (107 kg)  10/05/15 220 lb (99.8 kg)    General: Overweight male, appears comfortable at rest. HEENT: Conjunctiva and lids normal, oropharynx clear. Neck: Supple, no elevated JVP or carotid bruits, no thyromegaly. Lungs: Clear to auscultation, nonlabored breathing at rest. Cardiac: Regular rate and rhythm, no S3 or significant systolic murmur, no pericardial rub. Abdomen: Soft, nontender, bowel sounds present. Extremities: No pitting edema, distal pulses 2+. Skin:  Warm and dry. Musculoskeletal: No kyphosis. Neuropsychiatric: Alert and oriented x3, affect grossly appropriate.  ECG: I personally reviewed the tracing from 02/22/2017 which showed sinus rhythm.  Recent Labwork: No results found for requested labs within last 8760 hours.     Component Value Date/Time   CHOL 121 07/22/2014 0846   TRIG 169 (H) 07/22/2014 0846   HDL 35 (L) 07/22/2014 0846   CHOLHDL 3.5 07/22/2014 0846   VLDL 34 07/22/2014 0846   LDLCALC 52 07/22/2014 0846    Other Studies Reviewed Today:  Exercise Myoview 03/05/2017:  Blood pressure demonstrated a hypertensive response to exercise.  There was no ST segment deviation noted during stress.  The study is normal. There are no perfusion defects consistent with prior infarct or current ischemia.  This is a low risk study. 10 minutes on treadmill, Duke treadmill score of 10 consistent with low risk.  The left ventricular ejection fraction is normal (55-65%).  Occasional PVCs during recovery, no NSVT or VT  Assessment and Plan:  1.  Nonobstructive CAD by prior evaluation with low risk exercise Myoview done in follow-up as of June 2018.  He continues on aspirin and statin therapy.  ECG reviewed and normal.  2.  Mixed hyperlipidemia, on Lipitor.  Requesting interval lab work from Dr. Nevada Carr.  3.  Obstructive sleep apnea on CPAP.  4.  Essential hypertension, blood pressure is well controlled today.  Current medicines were reviewed with the patient today.   Orders Placed This Encounter  Procedures  . EKG 12-Lead    Disposition: Follow-up in 1 year.  Signed, Satira Sark, MD, [facility] 09/26/2018 2:23 PM    [place] [facility] [address] [place], [place] [address] Phone: [phone]; Fax: [phone]

## 2018-09-26 NOTE — Patient Instructions (Signed)
Medication Instructions:  Your physician recommends that you continue on your current medications as directed. Please refer to the Current Medication list given to you today.   Labwork: I will request labs from pcp  Testing/Procedures: none  Follow-Up: Your physician wants you to follow-up in: 1 year .  You will receive a reminder letter in the mail two months in advance. If you don't receive a letter, please call our office to schedule the follow-up appointment.   Any Other Special Instructions Will Be Listed Below (If Applicable).     If you need a refill on your cardiac medications before your next appointment, please call your pharmacy.   

## 2019-11-12 ENCOUNTER — Telehealth: Payer: Self-pay

## 2019-11-12 NOTE — Telephone Encounter (Signed)
Virtual Visit Pre-Appointment Phone Call  "(Name), I am calling you today to discuss your upcoming appointment. We are currently trying to limit exposure to the virus that causes COVID-19 by seeing patients at home rather than in the office."  1. "What is the BEST phone number to call the day of the visit?" - include this in appointment notes  2. "Do you have or have access to (through a family member/friend) a smartphone with video capability that we can use for your visit?" a. If yes - list this number in appt notes as "cell" (if different from BEST phone #) and list the appointment type as a VIDEO visit in appointment notes b. If no - list the appointment type as a PHONE visit in appointment notes  Confirm consent - "In the setting of the current Covid19 crisis, you are scheduled for a (phone or video) visit with your provider on (date) at (time).  Just as we do with many in-office visits, in order for you to participate in this visit, we must obtain consent.  If you'd like, I can send this to your mychart (if signed up) or email for you to review.  Otherwise, I can obtain your verbal consent now.  All virtual visits are billed to your insurance company just like a normal visit would be.  By agreeing to a virtual visit, we'd like you to understand that the technology does not allow for your provider to perform an examination, and thus may limit your provider's ability to fully assess your condition. If your provider identifies any concerns that need to be evaluated in person, we will make arrangements to do so.  Finally, though the technology is pretty good, we cannot assure that it will always work on either your or our end, and in the setting of a video visit, we may have to convert it to a phone-only visit.  In either situation, we cannot ensure that we have a secure connection.  Are you willing to proceed?" STAFF: Did the patient verbally acknowledge consent to telehealth visit? Document  YES/NO here:  3. Advise patient to be prepared - "Two hours prior to your appointment, go ahead and check your blood pressure, pulse, oxygen saturation, and your weight (if you have the equipment to check those) and write them all down. When your visit starts, your provider will ask you for this information. If you have an Apple Watch or Kardia device, please plan to have heart rate information ready on the day of your appointment. Please have a pen and paper handy nearby the day of the visit as well."  4. Give patient instructions for MyChart download to smartphone OR Doximity/Doxy.me as below if video visit (depending on what platform provider is using)  5. Inform patient they will receive a phone call 15 minutes prior to their appointment time (may be from unknown caller ID) so they should be prepared to answer    TELEPHONE CALL NOTE  Marvin Carr has been deemed a candidate for a follow-up tele-health visit to limit community exposure during the Covid-19 pandemic. I spoke with the patient via phone to ensure availability of phone/video source, confirm preferred email & phone number, and discuss instructions and expectations.  I reminded Marvin Carr to be prepared with any vital sign and/or heart rhythm information that could potentially be obtained via home monitoring, at the time of his visit. I reminded Marvin Carr to expect a phone call prior to his visit.  Dorothey Baseman  11/12/2019 4:42 PM   INSTRUCTIONS FOR DOWNLOADING THE MYCHART APP TO SMARTPHONE  - The patient must first make sure to have activated MyChart and know their login information - If Apple, go to [employer] and type in MyChart in the search bar and download the app. If Android, ask patient to go to [employer] and type in [place] in the search bar and download the app. The app is free but as with any other app downloads, their phone may require them to verify saved payment information or Apple/Android  password.  - The patient will need to then log into the app with their MyChart username and password, and select [facility] as their healthcare provider to link the account. When it is time for your visit, go to the MyChart app, find appointments, and click Begin Video Visit. Be sure to Select Allow for your device to access the Microphone and Camera for your visit. You will then be connected, and your provider will be with you shortly.  **If they have any issues connecting, or need assistance please contact MyChart service desk [phone])**  **If using a computer, in order to ensure the best quality for their visit they will need to use either of the following Internet Browsers: [employer], or Google Chrome**  IF USING DOXIMITY or DOXY.ME - The patient will receive a link just prior to their visit by text.     FULL LENGTH CONSENT FOR TELE-HEALTH VISIT   I hereby voluntarily request, consent and authorize [facility] and its employed or contracted physicians, physician assistants, nurse practitioners or other licensed health care professionals (the Practitioner), to provide me with telemedicine health care services (the "Services") as deemed necessary by the treating Practitioner. I acknowledge and consent to receive the Services by the Practitioner via telemedicine. I understand that the telemedicine visit will involve communicating with the Practitioner through live audiovisual communication technology and the disclosure of certain medical information by electronic transmission. I acknowledge that I have been given the opportunity to request an in-person assessment or other available alternative prior to the telemedicine visit and am voluntarily participating in the telemedicine visit.  I understand that I have the right to withhold or withdraw my consent to the use of telemedicine in the course of my care at any time, without affecting my right to future care or treatment,  and that the Practitioner or I may terminate the telemedicine visit at any time. I understand that I have the right to inspect all information obtained and/or recorded in the course of the telemedicine visit and may receive copies of available information for a reasonable fee.  I understand that some of the potential risks of receiving the Services via telemedicine include:  Marland Kitchen Delay or interruption in medical evaluation due to technological equipment failure or disruption; . Information transmitted may not be sufficient (e.g. poor resolution of images) to allow for appropriate medical decision making by the Practitioner; and/or  . In rare instances, security protocols could fail, causing a breach of personal health information.  Furthermore, I acknowledge that it is my responsibility to provide information about my medical history, conditions and care that is complete and accurate to the best of my ability. I acknowledge that Practitioner's advice, recommendations, and/or decision may be based on factors not within their control, such as incomplete or inaccurate data provided by me or distortions of diagnostic images or specimens that may result from electronic transmissions. I understand that the practice of medicine  is not an Chief Strategy Officer and that Practitioner makes no warranties or guarantees regarding treatment outcomes. I acknowledge that I will receive a copy of this consent concurrently upon execution via email to the email address I last provided but may also request a printed copy by calling the office of [facility].    I understand that my insurance will be billed for this visit.   I have read or had this consent read to me. . I understand the contents of this consent, which adequately explains the benefits and risks of the Services being provided via telemedicine.  . I have been provided ample opportunity to ask questions regarding this consent and the Services and have had my questions  answered to my satisfaction. . I give my informed consent for the services to be provided through the use of telemedicine in my medical care  By participating in this telemedicine visit I agree to the above.

## 2019-11-13 NOTE — Progress Notes (Signed)
Virtual Visit via Telephone Note   This visit type was conducted due to national recommendations for restrictions regarding the COVID-19 Pandemic (e.g. social distancing) in an effort to limit this patient's exposure and mitigate transmission in our community.  Due to his co-morbid illnesses, this patient is at least at moderate risk for complications without adequate follow up.  This format is felt to be most appropriate for this patient at this time.  The patient did not have access to video technology/had technical difficulties with video requiring transitioning to audio format only (telephone).  All issues noted in this document were discussed and addressed.  No physical exam could be performed with this format.  Please refer to the patient's chart for his  consent to telehealth for [facility].  The patient was identified using 2 identifiers.  Date:  11/14/2019   ID:  Marvin Carr, DOB 05-28-1956, MRN [id number]  Patient Location: Home Provider Location: Home  PCP:  Celene Squibb, MD  Cardiologist:  Satira Sark, MD Electrophysiologist:  None   Evaluation Performed:  Follow-Up Visit  Chief Complaint:  Cardiac follow-up  History of Present Illness:    Marvin Carr is a 64 y.o. male last seen in January 2020.  We spoke by phone today.  From a cardiac perspective, he does not report any obvious angina symptoms or definitive change in stamina.  He admits that he has not been as active in particular the last 6 months and has gained a significant amount of weight.  He has done well after prior hip surgery and would like to get back to walking and regular exercise as the weather improves.  He continues to follow with Dr. Nevada Crane and had recent lab work which we are requesting for review.  Lipids from July of last year included LDL 83 and triglycerides 150.  He has come off of Lipitor and is now on fenofibrate based on review of his medications.  He does not entirely recall whether  this was related to side effects or not.  Remainder of his cardiac regimen includes aspirin, lisinopril, and as needed nitroglycerin.  The patient does not have symptoms concerning for COVID-19 infection (fever, chills, cough, or new shortness of breath).    Past Medical History:  Diagnosis Date  . Anxiety   . Bladder cancer ([place]) 2011  . CAD (coronary artery disease), native coronary artery    a. Mildly elevated troponin 03/2013, cath with nonobstructive disease including 50% AV groove distal stenosis before large OM  . Essential hypertension   . Headache(784.0)    History of migraines  . Hyperglycemia   . Mixed hyperlipidemia   . Neuropathy   . Obesity   . Seasonal allergies   . Sleep apnea    On CPAP   Past Surgical History:  Procedure Laterality Date  . COLONOSCOPY  06/28/2011   Procedure: COLONOSCOPY;  Surgeon: Rogene Houston, MD;  Location[facility];  Service: Endoscopy;  Laterality: N/A;  . LEFT HEART CATHETERIZATION WITH CORONARY ANGIOGRAM N/A 03/31/2013   Procedure: LEFT HEART CATHETERIZATION WITH CORONARY ANGIOGRAM;  Surgeon: Minus Breeding, MD;  Location: [facility] CATH LAB;  Service: Cardiovascular;  Laterality: N/A;  . TURBT  2011     Current Meds  Medication Sig  . aspirin EC 81 MG tablet Take 81 mg by mouth daily.  . cyclobenzaprine (FLEXERIL) 10 MG tablet Take 10 mg by mouth at bedtime.  . eszopiclone (LUNESTA) 2 MG TABS tablet Take 2 mg by mouth  at bedtime as needed for sleep. Take immediately before bedtime  . fenofibrate 160 MG tablet Take 160 mg by mouth daily.  Marland Kitchen FLUoxetine (PROZAC) 10 MG tablet Take 10 mg by mouth daily.  . Gabapentin Enacarbil (HORIZANT) 600 MG TBCR Take by mouth daily.  Marland Kitchen lisinopril (PRINIVIL,ZESTRIL) 10 MG tablet Take 1 tablet (10 mg total) by mouth daily.  . nitroGLYCERIN (NITROSTAT) 0.4 MG SL tablet Place 1 tablet (0.4 mg total) under the tongue every 5 (five) minutes as needed for chest pain.  Marland Kitchen omeprazole (PRILOSEC) 40 MG capsule  Take 40 mg by mouth daily.  . pramipexole (MIRAPEX) 0.5 MG tablet Take 0.5 mg by mouth 3 (three) times daily.   . traMADol (ULTRAM) 50 MG tablet Take 50 mg by mouth daily as needed for moderate pain.      Allergies:   Patient has no known allergies.   ROS:   Weight gain.  Prior CV studies:   The following studies were reviewed today:  Exercise Myoview 03/05/2017:  Blood pressure demonstrated a hypertensive response to exercise.  There was no ST segment deviation noted during stress.  The study is normal. There are no perfusion defects consistent with prior infarct or current ischemia.  This is a low risk study. 10 minutes on treadmill, Duke treadmill score of 10 consistent with low risk.  The left ventricular ejection fraction is normal (55-65%).  Occasional PVCs during recovery, no NSVT or VT  Labs/Other Tests and Data Reviewed:    EKG:  An ECG dated 09/26/2018 was personally reviewed today and demonstrated:  Sinus rhythm.  Recent Labs:  December 2019: Hemoglobin 16.8, platelets 370, BUN 16, creatinine 0.91, potassium 4.1, AST 23, ALT 30, cholesterol 167, triglycerides 181, HDL 38, LDL 93, hemoglobin A1c 5.6%  Wt Readings from Last 3 Encounters:  11/14/19 270 lb (122.5 kg)  09/26/18 242 lb (109.8 kg)  02/22/17 235 lb 12.8 oz (107 kg)     Objective:    Vital Signs:  BP 135/81   Pulse 83   Ht 6' (1.829 m)   Wt 270 lb (122.5 kg)   BMI 36.62 kg/m    Patient spoke in complete sentences, not short of breath. No audible wheezing or coughing. Speech pattern normal.  ASSESSMENT & PLAN:    1.  History of nonobstructive CAD documented at cardiac catheterization in 2014.  Follow-up Myoview in 2018 was low risk.  He does not describe any definite angina symptoms on medical therapy and will plan to continue with observation for now.  Continue aspirin, lisinopril, and as needed nitroglycerin.  2.  Mixed hyperlipidemia.  He was previously on Lipitor, this has been changed to  fenofibrate by Dr. Nevada Crane.  He is not entirely certain whether he was having side effects or not at this point.  I am requesting his most recent lipid panel.  Time:   Today, I have spent 18 minutes with the patient with telehealth technology discussing the above problems.     Medication Adjustments/Labs and Tests Ordered: Current medicines are reviewed at length with the patient today.  Concerns regarding medicines are outlined above.   Tests Ordered: No orders of the defined types were placed in this encounter.   Medication Changes: No orders of the defined types were placed in this encounter.   Follow Up:  In Person 1 year in the [place] office.  Signed, Rozann Lesches, MD  11/14/2019 9:37 AM    [place]

## 2019-11-14 ENCOUNTER — Encounter: Payer: Self-pay | Admitting: Cardiology

## 2019-11-14 ENCOUNTER — Telehealth (INDEPENDENT_AMBULATORY_CARE_PROVIDER_SITE_OTHER): Payer: Self-pay | Admitting: Cardiology

## 2019-11-14 VITALS — BP 135/81 | HR 83 | Ht 72.0 in | Wt 270.0 lb

## 2019-11-14 DIAGNOSIS — I25119 Atherosclerotic heart disease of native coronary artery with unspecified angina pectoris: Secondary | ICD-10-CM

## 2019-11-14 DIAGNOSIS — I10 Essential (primary) hypertension: Secondary | ICD-10-CM

## 2019-11-14 DIAGNOSIS — E782 Mixed hyperlipidemia: Secondary | ICD-10-CM

## 2019-11-14 NOTE — Patient Instructions (Addendum)
Medication Instructions:  Your physician recommends that you continue on your current medications as directed. Please refer to the Current Medication list given to you today.  *If you need a refill on your cardiac medications before your next appointment, please call your pharmacy*   Lab Work: None today If you have labs (blood work) drawn today and your tests are completely normal, you will receive your results only by: Marland Kitchen MyChart Message (if you have MyChart) OR . A paper copy in the mail If you have any lab test that is abnormal or we need to change your treatment, we will call you to review the results.   Testing/Procedures: None today  I will request labs from [place]: At [facility], you and your health needs are our priority.  As part of our continuing mission to provide you with exceptional heart care, we have created designated Provider Care Teams.  These Care Teams include your primary Cardiologist (physician) and Advanced Practice Providers (APPs -  Physician Assistants and Nurse Practitioners) who all work together to provide you with the care you need, when you need it.  We recommend signing up for the patient portal called "MyChart".  Sign up information is provided on this After Visit Summary.  MyChart is used to connect with patients for Virtual Visits (Telemedicine).  Patients are able to view lab/test results, encounter notes, upcoming appointments, etc.  Non-urgent messages can be sent to your provider as well.   To learn more about what you can do with MyChart, go to [email or web address].    Your next appointment:   12 month(s)  The format for your next appointment:   In Person  Provider:   Rozann Lesches, MD   Other Instructions None      Thank you for choosing [facility] !

## 2021-04-30 ENCOUNTER — Other Ambulatory Visit: Payer: Self-pay

## 2021-04-30 ENCOUNTER — Ambulatory Visit (HOSPITAL_COMMUNITY)
Admission: RE | Admit: 2021-04-30 | Discharge: 2021-04-30 | Disposition: A | Discharge status: Home / Self Care | Payer: Medicare Other | Source: Ambulatory Visit | Attending: Internal Medicine | Admitting: Internal Medicine

## 2021-04-30 ENCOUNTER — Other Ambulatory Visit (HOSPITAL_COMMUNITY): Payer: Self-pay | Admitting: Internal Medicine

## 2021-04-30 DIAGNOSIS — R0600 Dyspnea, unspecified: Secondary | ICD-10-CM

## 2021-06-07 ENCOUNTER — Encounter (INDEPENDENT_AMBULATORY_CARE_PROVIDER_SITE_OTHER): Payer: Self-pay | Admitting: *Deleted

## 2021-07-13 ENCOUNTER — Encounter (INDEPENDENT_AMBULATORY_CARE_PROVIDER_SITE_OTHER): Payer: Self-pay | Admitting: *Deleted

## 2021-07-22 ENCOUNTER — Other Ambulatory Visit (HOSPITAL_COMMUNITY): Payer: Self-pay | Admitting: Radiology

## 2021-07-22 DIAGNOSIS — R0600 Dyspnea, unspecified: Secondary | ICD-10-CM

## 2021-08-17 ENCOUNTER — Other Ambulatory Visit: Payer: Self-pay | Admitting: Internal Medicine

## 2021-08-17 ENCOUNTER — Other Ambulatory Visit (HOSPITAL_COMMUNITY): Payer: Self-pay | Admitting: Internal Medicine

## 2021-08-17 DIAGNOSIS — R109 Unspecified abdominal pain: Secondary | ICD-10-CM

## 2021-08-29 ENCOUNTER — Ambulatory Visit (HOSPITAL_COMMUNITY)
Admission: RE | Admit: 2021-08-29 | Discharge: 2021-08-29 | Disposition: A | Discharge status: Home / Self Care | Payer: Medicare Other | Source: Ambulatory Visit | Attending: Internal Medicine | Admitting: Internal Medicine

## 2021-08-29 ENCOUNTER — Other Ambulatory Visit: Payer: Self-pay

## 2021-08-29 DIAGNOSIS — R109 Unspecified abdominal pain: Secondary | ICD-10-CM | POA: Diagnosis present

## 2021-08-31 ENCOUNTER — Other Ambulatory Visit: Payer: Self-pay | Admitting: Internal Medicine

## 2021-08-31 ENCOUNTER — Other Ambulatory Visit (HOSPITAL_COMMUNITY): Payer: Self-pay | Admitting: Internal Medicine

## 2021-08-31 DIAGNOSIS — K769 Liver disease, unspecified: Secondary | ICD-10-CM

## 2021-08-31 NOTE — Progress Notes (Signed)
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 Nevada Crane.  Today he reports feeling good, and he is accompanied by a friend. He reports constant pressure on the sides of his abdomen and in his epigastric region which started 1 month ago and is intermittently painful; the pressure and pain are unchanged with deep breathing. He denies n/v/d, loss of appetite, and unintentional weight loss; he reports losing 20 pounds over the past 3-4 months intentionally through dietary changes; he has been on 7 mg  Rybelsus since August.  He had a biopsy of a suspicious spot on his back done on 08/29/21. He reports his last colonoscopy was 10 years ago. He reports difficulty passing BM and low caliber stool. He denies hematochezia and black stool. He reports a history of bladder cancer in 2010. He denies hematuria. He reports dry cough over the past month. He currently lives at home alone. He currently works remotely as a Artist. He denies extensive smoking history. His father had MDS, and his paternal grandfather had prostate cancer.  MEDICAL HISTORY:  Past Medical History:  Diagnosis Date   Anxiety    Bladder cancer ([place]) 2011   CAD (coronary artery disease), native coronary artery    a. Mildly elevated troponin 03/2013, cath with nonobstructive disease including 50% AV groove distal stenosis before large OM   Essential hypertension    Headache(784.0)    History of  migraines   Hyperglycemia    Mixed hyperlipidemia    Neuropathy    Obesity    Seasonal allergies    Sleep apnea    On CPAP    SURGICAL HISTORY: Past Surgical History:  Procedure Laterality Date   COLONOSCOPY  06/28/2011   Procedure: COLONOSCOPY;  Surgeon: Rogene Houston, MD;  Location: [facility];  Service: Endoscopy;  Laterality: N/A;   LEFT HEART CATHETERIZATION WITH CORONARY ANGIOGRAM N/A 03/31/2013   Procedure: LEFT HEART CATHETERIZATION WITH CORONARY ANGIOGRAM;  Surgeon: Minus Breeding, MD;  Location: [facility] CATH LAB;  Service: Cardiovascular;  Laterality: N/A;   TURBT  2011    SOCIAL HISTORY: Social History   Socioeconomic History   Marital status: Divorced    Spouse name: Not on file   Number of children: 0   Years of education: college   Highest education level: Not on file  Occupational History    Employer: SELF-EMPLOYED  Tobacco Use   Smoking status: Former    Packs/day: 0.50    Years: 10.00    Pack years: 5.00    Types: Cigarettes    Quit date: 07/10/2011    Years since quitting: 10.1   Smokeless tobacco: Never   Tobacco comments:    Quit several yrs prior to 03/2013.  Substance and Sexual Activity   Alcohol use: Yes    Alcohol/week: 0.0 standard drinks    Comment: Occasional   Drug use: No   Sexual activity: Not on file  Other Topics Concern   Not on file  Social History Narrative   Not on file  Social Determinants of Health   Financial Resource Strain: Not on file  Food Insecurity: Not on file  Transportation Needs: Not on file  Physical Activity: Not on file  Stress: Not on file  Social Connections: Not on file  Intimate Partner Violence: Not on file    FAMILY HISTORY: Family History  Problem Relation Age of Onset   COPD Father     ALLERGIES:  has No Known Allergies.  MEDICATIONS:  Current Outpatient Medications  Medication Sig Dispense Refill   aspirin EC 81 MG tablet Take 81 mg by mouth daily.     buPROPion (WELLBUTRIN XL)  300 MG 24 hr tablet bupropion HCl XL 300 mg 24 hr tablet, extended release     cyclobenzaprine (FLEXERIL) 10 MG tablet Take 10 mg by mouth at bedtime.     diclofenac (VOLTAREN) 75 MG EC tablet Take 75 mg by mouth 2 (two) times daily.     eszopiclone (LUNESTA) 2 MG TABS tablet Take 2 mg by mouth at bedtime as needed for sleep. Take immediately before bedtime     fenofibrate 160 MG tablet Take 160 mg by mouth daily.     FLUoxetine (PROZAC) 10 MG tablet Take 10 mg by mouth daily.     gabapentin (NEURONTIN) 300 MG capsule Take 600 mg by mouth every 8 (eight) hours as needed.     metFORMIN (GLUCOPHAGE-XR) 500 MG 24 hr tablet Take 500 mg by mouth 2 (two) times daily.     nitroGLYCERIN (NITROSTAT) 0.4 MG SL tablet Place 1 tablet (0.4 mg total) under the tongue every 5 (five) minutes as needed for chest pain. 25 tablet 3   olmesartan (BENICAR) 20 MG tablet Take 20 mg by mouth daily.     omeprazole (PRILOSEC) 40 MG capsule Take 40 mg by mouth daily.     pramipexole (MIRAPEX) 0.5 MG tablet Take 0.5 mg by mouth 3 (three) times daily.      RYBELSUS 7 MG TABS Take 1 tablet by mouth daily.     tamsulosin (FLOMAX) 0.4 MG CAPS capsule Take 0.4 mg by mouth daily.     traMADol (ULTRAM) 50 MG tablet Take 50 mg by mouth daily as needed for moderate pain.      No current facility-administered medications for this visit.    REVIEW OF SYSTEMS:   Review of Systems  Constitutional:  Negative for appetite change, fatigue and unexpected weight change (-20 lbs intentional).  Respiratory:  Positive for cough (dry) and shortness of breath.   Gastrointestinal:  Positive for abdominal pain (pressure). Negative for blood in stool, diarrhea, nausea and vomiting.  Genitourinary:  Negative for hematuria.   All other systems reviewed and are negative.   PHYSICAL EXAMINATION: ECOG PERFORMANCE STATUS: 0 - Asymptomatic  Vitals:   09/01/21 0837  BP: 133/90  Pulse: 98  Resp: 18  Temp: 98.4 F (36.9 C)  SpO2: 97%    Filed Weights   09/01/21 0837  Weight: 266 lb 12.8 oz (121 kg)   Physical Exam Vitals reviewed.  Constitutional:      Appearance: Normal appearance. He is obese.  Cardiovascular:     Rate and Rhythm: Normal rate and regular rhythm.     Pulses: Normal pulses.     Heart sounds: Normal heart sounds.  Pulmonary:     Effort: Pulmonary effort is normal.     Breath sounds: Normal breath sounds.  Abdominal:     Palpations: Abdomen is soft. There is no hepatomegaly, splenomegaly or mass.  Tenderness: There is no abdominal tenderness.  Musculoskeletal:     Right lower leg: No edema.     Left lower leg: No edema.  Lymphadenopathy:     Cervical: No cervical adenopathy.     Right cervical: No superficial cervical adenopathy.    Left cervical: No superficial cervical adenopathy.     Upper Body:     Right upper body: No supraclavicular, axillary or pectoral adenopathy.     Left upper body: No supraclavicular, axillary or pectoral adenopathy.     Lower Body: No right inguinal adenopathy. No left inguinal adenopathy.  Skin:    Findings: Lesion (7 mm crater on midback) present.  Neurological:     General: No focal deficit present.     Mental Status: He is alert and oriented to person, place, and time.  Psychiatric:        Mood and Affect: Mood normal.        Behavior: Behavior normal.     LABORATORY DATA:  I have reviewed the data as listed CBC Latest Ref Rng & Units 06/11/2014 03/31/2013 03/30/2013  WBC 4.0 - 10.5 K/uL 6.0 7.0 5.8  Hemoglobin 13.0 - 17.0 g/dL 14.7 15.2 13.7  Hematocrit 39.0 - 52.0 % 43.3 42.4 39.6  Platelets 150 - 400 K/uL 195 240 220   CMP Latest Ref Rng & Units 07/22/2014 06/11/2014 05/22/2013  Glucose 70 - 99 mg/dL - 129(H) -  BUN 6 - 23 mg/dL - 23 -  Creatinine 0.50 - 1.35 mg/dL - 0.74 -  Sodium 137 - 147 mEq/L - 142 -  Potassium 3.7 - 5.3 mEq/L - 4.2 -  Chloride 96 - 112 mEq/L - 106 -  CO2 19 - 32 mEq/L - 27 -  Calcium 8.4 - 10.5 mg/dL - 8.9 -  Total  Protein 6.0 - 8.3 g/dL 6.1 - 6.0  Total Bilirubin 0.2 - 1.2 mg/dL 0.9 - 0.7  Alkaline Phos 39 - 117 U/L 58 - 70  AST 0 - 37 U/L 21 - 18  ALT 0 - 53 U/L 27 - 28    RADIOGRAPHIC STUDIES: I have personally reviewed the radiological images as listed and agreed with the findings in the report. CT ABDOMEN PELVIS WO CONTRAST  Result Date: 08/29/2021 CLINICAL DATA:  Abdominal pain EXAM: CT ABDOMEN AND PELVIS WITHOUT CONTRAST TECHNIQUE: Multidetector CT imaging of the abdomen and pelvis was performed following the standard protocol without IV contrast. COMPARISON:  07/13/2010 FINDINGS: Lower chest: Lung bases are clear. No effusions. Heart is normal size. Hepatobiliary: Ill-defined low-density lesion within the medial segment of the left hepatic lobe measures up to 6.1 cm. Ill-defined low-density lesion in the right hepatic lobe adjacent to the gallbladder fossa measures 3.3 cm. These are new since prior study. Gallstone within the fundus, stable. No biliary ductal dilatation. Pancreas: No focal abnormality or ductal dilatation. Spleen: No focal abnormality.  Normal size. Adrenals/Urinary Tract: No adrenal abnormality. No focal renal abnormality. No stones or hydronephrosis. Urinary bladder is decompressed, grossly unremarkable. Stomach/Bowel: Left colonic diverticulosis. No active diverticulitis. No bowel obstruction. Stomach and small bowel decompressed, grossly unremarkable. Vascular/Lymphatic: No evidence of aneurysm or adenopathy. Reproductive: Prostate enlargement Other: Moderate ascites in the abdomen and pelvis. There is nodularity within the omentum anteriorly concerning for peritoneal tumor. Musculoskeletal: Right hip replacement. Small sclerotic focus in the left iliac bone is stable since 2011 compatible with bone island. No suspicious focal bony abnormality or acute bony abnormality. IMPRESSION: Ill-defined low-density lesions within the liver measuring up to  6.1 cm. This is concerning for possible  metastases. Recommend further evaluation with contrast-enhanced CT or MRI. Nodularity throughout the peritoneum/omentum anteriorly in the abdomen and pelvis concerning for peritoneal tumor. Recommend clinical correlation for cancer history. Moderate free fluid in the abdomen and pelvis. Left colonic diverticulosis. Aortic atherosclerosis. Cholelithiasis. These results will be called to the ordering clinician or representative by the Radiologist Assistant, and communication documented in the PACS or [employer]. Electronically Signed   By: Rolm Baptise M.D.   On: 08/29/2021 08:15    ASSESSMENT:  Liver lesion/peritoneal carcinomatosis: - Patient seen at the request of Dr. Delphina Cahill - Reported pressure on the sides of the abdomen with slight pain for the last 1 month.  He also reported pain in the epigastric region. - He had lost 20 pounds in the last 3 to 4 months intentionally, cutting back on sugars.  He was also started on semaglutide. - Colonoscopy on 06/28/2011 with a benign polypoid colonic mucosa in the descending colon.  No evidence of malignancy.  Social/family history: - He lives at home by himself.  He records voiceover/narrations. - Non-smoker. - Father died of MDS.  Paternal grandfather had prostate cancer.  Maternal grandfather had cancer.  3.  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 Alinda Money.  Reportedly received 1 treatment of intravesical chemo and has been on surveillance since then.  Last surveillance visit was in 2020.   PLAN:  Liver lesions/peritoneal carcinomatosis: - We have reviewed scan images and discussed in detail. - He reported some change in the stool caliber recently but denied any bleeding per rectum or melena. - Recommend routine labs along with CEA and CA 19-9 and LDH. - Recommend PET CT scan for further evaluation. - We will schedule biopsy of the liver lesions by IR for diagnosis. - RTC after biopsy.   Skin  biopsy: - She reportedly had skin biopsy by Dr. Nevada Crane last Monday on his back. - We will obtain biopsy results.   All questions were answered. The patient knows to call the clinic with any problems, questions or concerns.   Derek Jack, MD, 09/01/21 9:22 AM  [facility] [phone]   I, Thana Ates, am acting as a scribe for Dr. Derek Jack.  I, Derek Jack MD, have reviewed the above documentation for accuracy and completeness, and I agree with the above.

## 2021-09-01 ENCOUNTER — Encounter (HOSPITAL_COMMUNITY): Payer: Self-pay

## 2021-09-01 ENCOUNTER — Inpatient Hospital Stay (HOSPITAL_COMMUNITY): Payer: Medicare Other | Attending: Hematology | Admitting: Hematology

## 2021-09-01 ENCOUNTER — Other Ambulatory Visit: Payer: Self-pay

## 2021-09-01 ENCOUNTER — Inpatient Hospital Stay (HOSPITAL_COMMUNITY): Payer: Medicare Other

## 2021-09-01 VITALS — BP 133/90 | HR 98 | Temp 98.4°F | Resp 18 | Ht 72.0 in | Wt 266.8 lb

## 2021-09-01 DIAGNOSIS — K769 Liver disease, unspecified: Secondary | ICD-10-CM | POA: Insufficient documentation

## 2021-09-01 DIAGNOSIS — M5136 Other intervertebral disc degeneration, lumbar region: Secondary | ICD-10-CM | POA: Insufficient documentation

## 2021-09-01 DIAGNOSIS — Z09 Encounter for follow-up examination after completed treatment for conditions other than malignant neoplasm: Secondary | ICD-10-CM

## 2021-09-01 DIAGNOSIS — R970 Elevated carcinoembryonic antigen [CEA]: Secondary | ICD-10-CM | POA: Insufficient documentation

## 2021-09-01 DIAGNOSIS — Z8551 Personal history of malignant neoplasm of bladder: Secondary | ICD-10-CM | POA: Diagnosis not present

## 2021-09-01 DIAGNOSIS — C787 Secondary malignant neoplasm of liver and intrahepatic bile duct: Secondary | ICD-10-CM

## 2021-09-01 LAB — CBC WITH DIFFERENTIAL/PLATELET
Abs Immature Granulocytes: 0.03 10*3/uL (ref 0.00–0.07)
Basophils Absolute: 0.1 10*3/uL (ref 0.0–0.1)
Basophils Relative: 1 %
Eosinophils Absolute: 0.1 10*3/uL (ref 0.0–0.5)
Eosinophils Relative: 2 %
HCT: 50 % (ref 39.0–52.0)
Hemoglobin: 15.7 g/dL (ref 13.0–17.0)
Immature Granulocytes: 0 %
Lymphocytes Relative: 15 %
Lymphs Abs: 1.1 10*3/uL (ref 0.7–4.0)
MCH: 26.6 pg (ref 26.0–34.0)
MCHC: 31.4 g/dL (ref 30.0–36.0)
MCV: 84.7 fL (ref 80.0–100.0)
Monocytes Absolute: 1 10*3/uL (ref 0.1–1.0)
Monocytes Relative: 13 %
Neutro Abs: 5.5 10*3/uL (ref 1.7–7.7)
Neutrophils Relative %: 69 %
Platelets: 452 10*3/uL — ABNORMAL HIGH (ref 150–400)
RBC: 5.9 MIL/uL — ABNORMAL HIGH (ref 4.22–5.81)
RDW: 14.6 % (ref 11.5–15.5)
WBC: 7.8 10*3/uL (ref 4.0–10.5)
nRBC: 0 % (ref 0.0–0.2)

## 2021-09-01 LAB — COMPREHENSIVE METABOLIC PANEL
ALT: 28 U/L (ref 0–44)
AST: 31 U/L (ref 15–41)
Albumin: 4 g/dL (ref 3.5–5.0)
Alkaline Phosphatase: 58 U/L (ref 38–126)
Anion gap: 9 (ref 5–15)
BUN: 19 mg/dL (ref 8–23)
CO2: 23 mmol/L (ref 22–32)
Calcium: 9.8 mg/dL (ref 8.9–10.3)
Chloride: 106 mmol/L (ref 98–111)
Creatinine, Ser: 0.92 mg/dL (ref 0.61–1.24)
GFR, Estimated: >60 mL/min (ref >60)
Glucose, Bld: 110 mg/dL — ABNORMAL HIGH (ref 70–99)
Potassium: 4.2 mmol/L (ref 3.5–5.1)
Sodium: 138 mmol/L (ref 135–145)
Total Bilirubin: 0.3 mg/dL (ref 0.3–1.2)
Total Protein: 7.3 g/dL (ref 6.5–8.1)

## 2021-09-01 LAB — LACTATE DEHYDROGENASE: LDH: 242 U/L — ABNORMAL HIGH (ref 98–192)

## 2021-09-01 NOTE — Progress Notes (Signed)
[place], Docken Legal Sex  Male DOB  1956-07-25 SSN  [id number] Address  [address]  [place] [address] Phone  [phone] [facility])  [phone] (Mobile) *Preferred*    RE: US BIOPSY (LIVER) Received: Today Arne Cleveland, MD  Valli Glance [place]   CT core biopsy hepatic seg 4 mass. New since 2011   [facility]        Previous Messages   ----- Message -----  From: Valli Glance  Sent: 08/31/2021   3:43 PM EST  To: [facility] Procedure Requests  Subject: US BIOPSY (LIVER)                               US BIOPSY (LIVER)       Reason: Liver lesion       History:CT in Computer       Provider: Celene Squibb       Contact:[phone]

## 2021-09-01 NOTE — Progress Notes (Signed)
I met with the patient and his friend today during and following initial visit with Dr. Katragadda. I introduced myself and my role in the patient's care. I provided my contact information and encouraged the patient to call with questions or concerns. °

## 2021-09-01 NOTE — Patient Instructions (Addendum)
Startup at [facility] Discharge Instructions  You were seen and examined today by Dr. Delton Coombes. Dr. Delton Coombes is a medical oncologist, meaning that he specializes in the management of cancer diagnoses. Dr. Delton Coombes discussed your past medical history, family history of cancers, and the events that led to you being here today.  Dr. Delton Coombes reviewed your recent CT scan which revealed liver lesions and thickening of the lining of your abdomen concerning for cancer.   Dr. Delton Coombes has ordered nonspecific tumor markers.  Dr. Delton Coombes has recommended proceeding with a biopsy to identify exactly what is causing the liver lesions. Dr. Delton Coombes has also recommended a PET scan. A PET scan is a specialized CT scan that illuminates where there is cancer present in your body. It is unclear where the possible cancer has arisen from, the PET scan and biopsy will help to determine that as well as the cancer stage.  Follow-up as scheduled.   Thank you for choosing [facility] at [facility] to provide your oncology and hematology care.  To afford each patient quality time with our provider, please arrive at least 15 minutes before your scheduled appointment time.   If you have a lab appointment with the [facility] please come in thru the Main Entrance and check in at the main information desk.  You need to re-schedule your appointment should you arrive 10 or more minutes late.  We strive to give you quality time with our providers, and arriving late affects you and other patients whose appointments are after yours.  Also, if you no show three or more times for appointments you may be dismissed from the clinic at the providers discretion.     Again, thank you for choosing [facility].  Our hope is that these requests will decrease the amount of time that you wait before being seen by our physicians.        _____________________________________________________________  Should you have questions after your visit to [name], please contact our office at [phone] and follow the prompts.  Our office hours are 8:00 a.m. and 4:30 p.m. Monday - Friday.  Please note that voicemails left after 4:00 p.m. may not be returned until the following business day.  We are closed weekends and major holidays.  You do have access to a nurse 24-7, just call the main number to the clinic [phone] and do not press any options, hold on the line and a nurse will answer the phone.    For prescription refill requests, have your pharmacy contact our office and allow 72 hours.    Due to Covid, you will need to wear a mask upon entering the hospital. If you do not have a mask, a mask will be given to you at the Main Entrance upon arrival. For doctor visits, patients may have 1 support person age 42 or older with them. For treatment visits, patients can not have anyone with them due to social distancing guidelines and our immunocompromised population.

## 2021-09-02 LAB — CEA: CEA: 34.3 ng/mL — ABNORMAL HIGH (ref 0.0–4.7)

## 2021-09-02 LAB — CANCER ANTIGEN 19-9: CA 19-9: 9 U/mL (ref 0–35)

## 2021-09-08 ENCOUNTER — Encounter (HOSPITAL_COMMUNITY): Payer: Self-pay

## 2021-09-08 ENCOUNTER — Encounter (HOSPITAL_COMMUNITY)
Admission: RE | Admit: 2021-09-08 | Discharge: 2021-09-08 | Disposition: A | Discharge status: Home / Self Care | Payer: Medicare Other | Source: Ambulatory Visit | Attending: Hematology | Admitting: Hematology

## 2021-09-08 ENCOUNTER — Other Ambulatory Visit: Payer: Self-pay

## 2021-09-08 DIAGNOSIS — C787 Secondary malignant neoplasm of liver and intrahepatic bile duct: Secondary | ICD-10-CM | POA: Diagnosis not present

## 2021-09-08 DIAGNOSIS — Z8551 Personal history of malignant neoplasm of bladder: Secondary | ICD-10-CM | POA: Insufficient documentation

## 2021-09-08 DIAGNOSIS — R160 Hepatomegaly, not elsewhere classified: Secondary | ICD-10-CM | POA: Diagnosis not present

## 2021-09-08 DIAGNOSIS — K769 Liver disease, unspecified: Secondary | ICD-10-CM | POA: Diagnosis not present

## 2021-09-08 MED ORDER — FLUDEOXYGLUCOSE F - 18 (FDG) INJECTION
13.8200 | Freq: Once | INTRAVENOUS | Status: AC | PRN
  Administered 2021-09-08: 11:00:00 13.392 via INTRAVENOUS

## 2021-09-09 ENCOUNTER — Encounter (HOSPITAL_COMMUNITY): Payer: Self-pay

## 2021-09-11 ENCOUNTER — Encounter (HOSPITAL_COMMUNITY): Payer: Self-pay

## 2021-09-13 ENCOUNTER — Other Ambulatory Visit (HOSPITAL_COMMUNITY): Payer: Self-pay | Admitting: *Deleted

## 2021-09-13 DIAGNOSIS — R413 Other amnesia: Secondary | ICD-10-CM

## 2021-09-13 DIAGNOSIS — C787 Secondary malignant neoplasm of liver and intrahepatic bile duct: Secondary | ICD-10-CM

## 2021-09-15 ENCOUNTER — Other Ambulatory Visit (HOSPITAL_COMMUNITY): Payer: Self-pay | Admitting: Internal Medicine

## 2021-09-15 ENCOUNTER — Other Ambulatory Visit: Payer: Self-pay

## 2021-09-15 ENCOUNTER — Ambulatory Visit (HOSPITAL_COMMUNITY)
Admission: RE | Admit: 2021-09-15 | Discharge: 2021-09-15 | Disposition: A | Discharge status: Home / Self Care | Payer: Medicare Other | Source: Ambulatory Visit | Attending: Internal Medicine | Admitting: Internal Medicine

## 2021-09-15 ENCOUNTER — Encounter (HOSPITAL_COMMUNITY): Payer: Self-pay

## 2021-09-15 ENCOUNTER — Other Ambulatory Visit: Payer: Self-pay | Admitting: Student

## 2021-09-15 DIAGNOSIS — Z8551 Personal history of malignant neoplasm of bladder: Secondary | ICD-10-CM | POA: Insufficient documentation

## 2021-09-15 DIAGNOSIS — I10 Essential (primary) hypertension: Secondary | ICD-10-CM | POA: Insufficient documentation

## 2021-09-15 DIAGNOSIS — I2510 Atherosclerotic heart disease of native coronary artery without angina pectoris: Secondary | ICD-10-CM | POA: Diagnosis not present

## 2021-09-15 DIAGNOSIS — K769 Liver disease, unspecified: Secondary | ICD-10-CM

## 2021-09-15 DIAGNOSIS — C787 Secondary malignant neoplasm of liver and intrahepatic bile duct: Secondary | ICD-10-CM | POA: Diagnosis not present

## 2021-09-15 DIAGNOSIS — R188 Other ascites: Secondary | ICD-10-CM | POA: Diagnosis present

## 2021-09-15 DIAGNOSIS — C786 Secondary malignant neoplasm of retroperitoneum and peritoneum: Secondary | ICD-10-CM | POA: Insufficient documentation

## 2021-09-15 LAB — CBC
HCT: 49.2 % (ref 39.0–52.0)
Hemoglobin: 15.5 g/dL (ref 13.0–17.0)
MCH: 25.5 pg — ABNORMAL LOW (ref 26.0–34.0)
MCHC: 31.5 g/dL (ref 30.0–36.0)
MCV: 80.8 fL (ref 80.0–100.0)
Platelets: 516 10*3/uL — ABNORMAL HIGH (ref 150–400)
RBC: 6.09 MIL/uL — ABNORMAL HIGH (ref 4.22–5.81)
RDW: 15.1 % (ref 11.5–15.5)
WBC: 8.9 10*3/uL (ref 4.0–10.5)
nRBC: 0 % (ref 0.0–0.2)

## 2021-09-15 LAB — PROTIME-INR
INR: 0.9 (ref 0.8–1.2)
Prothrombin Time: 12.5 seconds (ref 11.4–15.2)

## 2021-09-15 MED ORDER — MIDAZOLAM HCL 2 MG/2ML IJ SOLN
INTRAMUSCULAR | Status: AC | PRN
  Administered 2021-09-15 (×2): 1 mg via INTRAVENOUS
  Administered 2021-09-15 (×2): .5 mg via INTRAVENOUS

## 2021-09-15 MED ORDER — FENTANYL CITRATE (PF) 100 MCG/2ML IJ SOLN
INTRAMUSCULAR | Status: AC
  Filled 2021-09-15: qty 2

## 2021-09-15 MED ORDER — MIDAZOLAM HCL 2 MG/2ML IJ SOLN
INTRAMUSCULAR | Status: AC
  Filled 2021-09-15: qty 2

## 2021-09-15 MED ORDER — FENTANYL CITRATE (PF) 100 MCG/2ML IJ SOLN
INTRAMUSCULAR | Status: AC | PRN
  Administered 2021-09-15: 25 ug via INTRAVENOUS
  Administered 2021-09-15: 50 ug via INTRAVENOUS
  Administered 2021-09-15: 25 ug via INTRAVENOUS
  Administered 2021-09-15: 50 ug via INTRAVENOUS

## 2021-09-15 MED ORDER — GELATIN ABSORBABLE 12-7 MM EX MISC
CUTANEOUS | Status: AC
  Filled 2021-09-15: qty 1

## 2021-09-15 MED ORDER — LIDOCAINE HCL (PF) 1 % IJ SOLN
INTRAMUSCULAR | Status: AC
  Filled 2021-09-15: qty 30

## 2021-09-15 MED ORDER — SODIUM CHLORIDE 0.9 % IV SOLN: INTRAVENOUS | Status: DC

## 2021-09-15 MED ORDER — HYDROCODONE-ACETAMINOPHEN 5-325 MG PO TABS: 1.0000 | ORAL_TABLET | ORAL | Status: DC | PRN

## 2021-09-15 NOTE — H&P (Signed)
Chief Complaint: Patient was seen in consultation today for liver lesion biopsy   Referring Physician(s): Hall,John Z  Supervising Physician: Markus Daft  Patient Status: [facility] - Out-pt  History of Present Illness: Marvin Carr is a 66 y.o. male with a medical history significant for anxiety, CAD, HTN and bladder cancer. He presented to his PCP with abdominal pain/pressure of approximately one month's duration. He had also lost 20 lbs in several months but this was intentional. 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   Interventional Radiology has been asked to evaluate this patient for an image-guided liver lesion biopsy for further work up. Imaging reviewed and procedure approved by Dr. Vernard Gambles.   Past Medical History:  Diagnosis Date   Anxiety    Bladder cancer ([place]) 2011   CAD (coronary artery disease), native coronary artery    a. Mildly elevated troponin 03/2013, cath with nonobstructive disease including 50% AV groove distal stenosis before large OM   Essential hypertension    Headache(784.0)    History of migraines   Hyperglycemia    Mixed hyperlipidemia    Neuropathy    Obesity    Seasonal allergies    Sleep apnea    On CPAP    Past Surgical History:  Procedure Laterality Date   COLONOSCOPY  06/28/2011   Procedure: COLONOSCOPY;  Surgeon: Rogene Houston, MD;  Location: [facility] ENDO SUITE;  Service: Endoscopy;  Laterality: N/A;   LEFT HEART CATHETERIZATION WITH CORONARY ANGIOGRAM N/A 03/31/2013   Procedure: LEFT HEART CATHETERIZATION WITH CORONARY ANGIOGRAM;  Surgeon: Minus Breeding, MD;  Location: [facility] CATH LAB;  Service: Cardiovascular;  Laterality: N/A;   TURBT  2011    Allergies: Patient has no known allergies.  Medications: Prior to Admission medications   Medication Sig Start Date End Date Taking? Authorizing Provider  buPROPion (WELLBUTRIN XL) 300 MG 24 hr tablet bupropion HCl XL 300 mg 24 hr tablet,  extended release 03/18/09  Yes [provider]  diclofenac (VOLTAREN) 75 MG EC tablet Take 75 mg by mouth 2 (two) times daily. 08/29/21  Yes [provider]  eszopiclone (LUNESTA) 2 MG TABS tablet Take 2 mg by mouth at bedtime as needed for sleep. Take immediately before bedtime   Yes [provider]  fenofibrate 160 MG tablet Take 160 mg by mouth daily. 11/05/19  Yes [provider]  FLUoxetine (PROZAC) 10 MG tablet Take 10 mg by mouth daily.   Yes [provider]  gabapentin (NEURONTIN) 300 MG capsule Take 600 mg by mouth every 8 (eight) hours as needed. 08/08/21  Yes [provider]  metFORMIN (GLUCOPHAGE-XR) 500 MG 24 hr tablet Take 500 mg by mouth 2 (two) times daily. 08/16/21  Yes [provider]  olmesartan (BENICAR) 20 MG tablet Take 20 mg by mouth daily. 08/16/21  Yes [provider]  omeprazole (PRILOSEC) 40 MG capsule Take 40 mg by mouth daily. 10/20/19  Yes [provider]  pramipexole (MIRAPEX) 0.5 MG tablet Take 0.5 mg by mouth 3 (three) times daily.    Yes [provider]  RYBELSUS 7 MG TABS Take 1 tablet by mouth daily. 08/16/21  Yes [provider]  tamsulosin (FLOMAX) 0.4 MG CAPS capsule Take 0.4 mg by mouth daily. 08/29/21  Yes [provider]  traMADol (ULTRAM) 50 MG tablet Take 50 mg by mouth daily as needed for moderate pain.  03/18/09  Yes [provider]  aspirin EC 81 MG tablet Take 81 mg  by mouth daily.    [provider]  cyclobenzaprine (FLEXERIL) 10 MG tablet Take 10 mg by mouth at bedtime.    [provider]  nitroGLYCERIN (NITROSTAT) 0.4 MG SL tablet Place 1 tablet (0.4 mg total) under the tongue every 5 (five) minutes as needed for chest pain. 09/26/18   Satira Sark, MD     Family History  Problem Relation Age of Onset   COPD Father     Social History   Socioeconomic History   Marital status: Divorced    Spouse name: Not on file    Number of children: 0   Years of education: college   Highest education level: Not on file  Occupational History    Employer: SELF-EMPLOYED  Tobacco Use   Smoking status: Former    Packs/day: 0.50    Years: 10.00    Pack years: 5.00    Types: Cigarettes    Quit date: 07/10/2011    Years since quitting: 10.1   Smokeless tobacco: Never   Tobacco comments:    Quit several yrs prior to 03/2013.  Substance and Sexual Activity   Alcohol use: Yes    Alcohol/week: 0.0 standard drinks    Comment: Occasional   Drug use: No   Sexual activity: Not on file  Other Topics Concern   Not on file  Social History Narrative   Not on file   Social Determinants of Health   Financial Resource Strain: Not on file  Food Insecurity: Not on file  Transportation Needs: Not on file  Physical Activity: Not on file  Stress: Not on file  Social Connections: Not on file    Review of Systems: A 12 point ROS discussed and pertinent positives are indicated in the HPI above.  All other systems are negative.  Review of Systems  Constitutional:  Negative for appetite change and fatigue.  Respiratory:  Positive for shortness of breath. Negative for cough.   Cardiovascular:  Negative for chest pain and leg swelling.  Gastrointestinal:  Positive for abdominal pain and constipation. Negative for nausea and vomiting.  Neurological:  Negative for headaches.   Vital Signs: BP 139/81    Pulse 85    Temp 98.3 F (36.8 C) (Oral)    Resp 18    Ht 6' (1.829 m)    Wt 270 lb (122.5 kg)    SpO2 95%    BMI 36.62 kg/m   Physical Exam Constitutional:      General: He is not in acute distress.    Appearance: He is not ill-appearing.  HENT:     Mouth/Throat:     Mouth: Mucous membranes are moist.     Pharynx: Oropharynx is clear.  Cardiovascular:     Rate and Rhythm: Normal rate and regular rhythm.  Pulmonary:     Effort: Pulmonary effort is normal.     Breath sounds: Normal breath sounds.  Abdominal:      General: Bowel sounds are normal. There is distension.     Palpations: Abdomen is soft.     Tenderness: There is abdominal tenderness.  Musculoskeletal:     Right lower leg: No edema.     Left lower leg: No edema.  Skin:    General: Skin is warm and dry.  Neurological:     Mental Status: He is alert and oriented to person, place, and time.    Imaging: CT ABDOMEN PELVIS WO CONTRAST  Result Date: 08/29/2021 CLINICAL DATA:  Abdominal pain EXAM: CT ABDOMEN  AND PELVIS WITHOUT CONTRAST TECHNIQUE: Multidetector CT imaging of the abdomen and pelvis was performed following the standard protocol without IV contrast. COMPARISON:  07/13/2010 FINDINGS: Lower chest: Lung bases are clear. No effusions. Heart is normal size. Hepatobiliary: Ill-defined low-density lesion within the medial segment of the left hepatic lobe measures up to 6.1 cm. Ill-defined low-density lesion in the right hepatic lobe adjacent to the gallbladder fossa measures 3.3 cm. These are new since prior study. Gallstone within the fundus, stable. No biliary ductal dilatation. Pancreas: No focal abnormality or ductal dilatation. Spleen: No focal abnormality.  Normal size. Adrenals/Urinary Tract: No adrenal abnormality. No focal renal abnormality. No stones or hydronephrosis. Urinary bladder is decompressed, grossly unremarkable. Stomach/Bowel: Left colonic diverticulosis. No active diverticulitis. No bowel obstruction. Stomach and small bowel decompressed, grossly unremarkable. Vascular/Lymphatic: No evidence of aneurysm or adenopathy. Reproductive: Prostate enlargement Other: Moderate ascites in the abdomen and pelvis. There is nodularity within the omentum anteriorly concerning for peritoneal tumor. Musculoskeletal: Right hip replacement. Small sclerotic focus in the left iliac bone is stable since 2011 compatible with bone island. No suspicious focal bony abnormality or acute bony abnormality. IMPRESSION: Ill-defined low-density lesions within  the liver measuring up to 6.1 cm. This is concerning for possible metastases. Recommend further evaluation with contrast-enhanced CT or MRI. Nodularity throughout the peritoneum/omentum anteriorly in the abdomen and pelvis concerning for peritoneal tumor. Recommend clinical correlation for cancer history. Moderate free fluid in the abdomen and pelvis. Left colonic diverticulosis. Aortic atherosclerosis. Cholelithiasis. These results will be called to the ordering clinician or representative by the Radiologist Assistant, and communication documented in the PACS or [employer]. Electronically Signed   By: Rolm Baptise M.D.   On: 08/29/2021 08:15   NM PET Image Initial (PI) Skull Base To Thigh (F-18 FDG)  Result Date: 09/09/2021 CLINICAL DATA:  Initial treatment strategy for history of bladder cancer. EXAM: NUCLEAR MEDICINE PET SKULL BASE TO THIGH TECHNIQUE: 13.392 mCi F-18 FDG was injected intravenously. Full-ring PET imaging was performed from the skull base to thigh after the radiotracer. CT data was obtained and used for attenuation correction and anatomic localization. Fasting blood glucose: 89 mg/dl COMPARISON:  CT abdomen and pelvis of August 29, 2021. FINDINGS: Mediastinal blood pool activity: SUV max 2.29 Liver activity: SUV max NA NECK: Mild asymmetry with respect to uptake at the LEFT base of tongue (image 29/3) maximum SUV 4.9 mild asymmetric fullness in this location of soft tissue. No hypermetabolic lymph nodes in the neck. Incidental CT findings: none CHEST: No hypermetabolic mediastinal or hilar nodes. No suspicious pulmonary nodules on the CT scan. Incidental CT findings: Aortic atherosclerosis and signs of coronary artery disease. Basilar atelectasis without effusion or consolidation. No suspicious pulmonary nodule. Top normal heart size. ABDOMEN/PELVIS: FDG avid disease in the liver and peritoneum as well as upper abdominal lymph nodes. (Image 120/3) area measuring up to 6.5 x 5.4 cm in  hepatic subsegment IV showing activity that extends beyond the liver margin to involve the gastric antrum adjacent with a maximum SUV of 19. Hepatic subsegment V (image 118/3) area measuring approximately 4.9 cm greatest axial dimension with a maximum SUV up to 16. Hepatic subsegment VIII low attenuation near the dome of the RIGHT hemi liver difficult to localize but with approximately 18 mm greatest axial dimension and a maximum SUV of 6.3. Is ascites along the liver margin. Nodularity along the liver margin. (Image 121/3) gastrohepatic lymph node measuring 16 mm short axis with a maximum SUV of 8.1. Diffuse peritoneal  and omental nodularity. Thickening of mesenteric planes. FDG avid omental caking (image 156/3 is an example with infiltration of the omentum without measurable lesion, discrete lesion but with a maximum SUV of 8.0. Nodularity in small volume fluid extends along the LEFT and RIGHT pericolic gutter and into the pelvis. Small volume pelvic fluid with similar volume compared to recent imaging with some increased metabolic activity in the dependent aspect of pelvic fluid. Incidental CT findings: Cholelithiasis and sludge in the gallbladder. No acute upper abdominal process. Gallbladder is slightly more distended than on previous imaging. No signs of bowel obstruction. Colonic diverticulosis. SKELETON: No focal hypermetabolic activity to suggest skeletal metastasis. Incidental CT findings: None aside from RIGHT hip arthroplasty and spinal degenerative changes. 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 Gastric antral involvement from peritoneal disease is contiguous with hepatic metastatic disease. Nodal involvement in the upper abdomen. Mild fullness of LEFT base of tongue and mildly asymmetric activity in this area, consider correlation with direct visualization to exclude mucosal lesion as warranted. Increasing distension of the gallbladder with  cholelithiasis. Correlate with any RIGHT upper quadrant symptoms with worsening and consider dedicated assessment with ultrasound as warranted. Stranding and nodularity throughout the peritoneum including about the gallbladder is related to peritoneal carcinomatosis. Electronically Signed   By: Zetta Bills M.D.   On: 09/09/2021 11:58    Labs:  CBC: Recent Labs    09/01/21 0924 09/15/21 0943  WBC 7.8 8.9  HGB 15.7 15.5  HCT 50.0 49.2  PLT 452* 516*    COAGS: Recent Labs    09/15/21 0943  INR 0.9    BMP: Recent Labs    09/01/21 0924  NA 138  K 4.2  CL 106  CO2 23  GLUCOSE 110*  BUN 19  CALCIUM 9.8  CREATININE 0.92  GFRNONAA >60    LIVER FUNCTION TESTS: Recent Labs    09/01/21 0924  BILITOT 0.3  AST 31  ALT 28  ALKPHOS 58  PROT 7.3  ALBUMIN 4.0    TUMOR MARKERS: No results for input(s): AFPTM, CEA, CA199, CHROMGRNA in the last 8760 hours.  Assessment and Plan:  Peritoneal carcinomatosis with liver lesions: Marvin Carr, 66 year old male, presents today to the [facility] Interventional Radiology department for an image-guided liver lesion biopsy and an image-guided paracentesis.   Risks and benefits of these procedures were discussed with the patient and/or patient's family including, but not limited to bleeding, infection, damage to adjacent structures or low yield requiring additional tests.  All of the questions were answered and there is agreement to proceed. He has been NPO. Labs and vitals have been reviewed.   Consent signed and in chart.  Thank you for this interesting consult.  I greatly enjoyed meeting Marvin Carr and look forward to participating in their care.  A copy of this report was sent to the requesting provider on this date.  Electronically Signed: Soyla Dryer, AGACNP-BC [phone] 09/15/2021, 10:46 AM   I spent a total of  30 Minutes   in face to face in clinical consultation, greater than 50% of which was  counseling/coordinating care for liver lesion biopsy.

## 2021-09-15 NOTE — Procedures (Signed)
Interventional Radiology Procedure:   Indications: Liver lesions with ascites and peritoneal disease  Procedure: US guided paracentesis and liver lesion biopsy  Findings: Removed 1.5 liters of amber colored ascites.  3 cores biopsies from left hepatic mass, segment 4.  Complications: No immediate complications noted.     EBL: Minimal  Plan: Bedrest 3 hours   Kiaraliz Rafuse R. Anselm Pancoast, MD  Pager: [phone]

## 2021-09-19 ENCOUNTER — Encounter (INDEPENDENT_AMBULATORY_CARE_PROVIDER_SITE_OTHER): Payer: Self-pay | Admitting: Gastroenterology

## 2021-09-19 ENCOUNTER — Other Ambulatory Visit (INDEPENDENT_AMBULATORY_CARE_PROVIDER_SITE_OTHER): Payer: Self-pay

## 2021-09-19 ENCOUNTER — Telehealth (INDEPENDENT_AMBULATORY_CARE_PROVIDER_SITE_OTHER): Payer: Self-pay

## 2021-09-19 ENCOUNTER — Encounter (INDEPENDENT_AMBULATORY_CARE_PROVIDER_SITE_OTHER): Payer: Self-pay

## 2021-09-19 ENCOUNTER — Other Ambulatory Visit: Payer: Self-pay

## 2021-09-19 ENCOUNTER — Ambulatory Visit (INDEPENDENT_AMBULATORY_CARE_PROVIDER_SITE_OTHER): Payer: Medicare Other | Admitting: Gastroenterology

## 2021-09-19 DIAGNOSIS — K669 Disorder of peritoneum, unspecified: Secondary | ICD-10-CM | POA: Diagnosis not present

## 2021-09-19 DIAGNOSIS — R198 Other specified symptoms and signs involving the digestive system and abdomen: Secondary | ICD-10-CM | POA: Diagnosis not present

## 2021-09-19 DIAGNOSIS — K769 Liver disease, unspecified: Secondary | ICD-10-CM | POA: Diagnosis not present

## 2021-09-19 DIAGNOSIS — C786 Secondary malignant neoplasm of retroperitoneum and peritoneum: Secondary | ICD-10-CM | POA: Insufficient documentation

## 2021-09-19 DIAGNOSIS — K5900 Constipation, unspecified: Secondary | ICD-10-CM | POA: Insufficient documentation

## 2021-09-19 LAB — SURGICAL PATHOLOGY

## 2021-09-19 LAB — CYTOLOGY - NON PAP

## 2021-09-19 MED ORDER — PEG 3350-KCL-NA BICARB-NACL 420 G PO SOLR: 4000.0000 mL | ORAL | 0 refills | Status: DC

## 2021-09-19 NOTE — Progress Notes (Signed)
Marvin Carr, M.D. Gastroenterology & Hepatology [facility] [address] [place], [place] [address] Primary Care Physician: Celene Squibb, MD [address] [place] [address]  Referring MD: PCP  Chief Complaint: Change in bowel movements, liver lesions concerning for metastatic cancer  History of Present Illness: Marvin Carr is a 66 y.o. male with PMH bladder cancer s/p local resection with TURBT and local chemo, OSA, HLD, anxiety, CAD, HTN, who presents for evaluation of change in bowel movements and liver lesions concerning for metastatic cancer.  Patient reports that he felt some "tightness" in his abdomen since 06/2021 which was unusual for him. He reports that he also had some tenderness in the LUQ at that time as well.  He denies having similar symptoms in the past.  Due to this, he was seen by his PCP and was initially referred to gastroenterology for further evaluation.  During the following months, he noticed very pale stools in November for a couple of days but it went back to its normal color afterwards. States he had some mild constipation in the past, but in [place] he noticed his BM frequency decreased to 1 bowel movement every 3-4 days - also noticed the stool had a pencil like appearance.  This was in usual for him as he used to have bowel movement every day.  As part of the evaluation of his symptoms he underwent CT of the abdomen and pelvis without contrast on 08/29/2021 which showed presence of an ill-defined low-density lesion measuring 6.1 cm in the medial segment of the left hepatic lobe, as well as an ill-defined lesion in the right hepatic lobe adjacent to the gallbladder fossa measuring 3.3 cm.  He was found to have gallstones in his fundus but no alterations.  Notably, there was significant nodularity throughout the peritoneum which was concerning for carcinomatosis.  There was also presence of moderate  free fluid in the abdomen and pelvis.  CBC and CMP which were checked on 09/01/2021 which showed normal liver enzymes, electrolytes and renal function, CBC was only remarkable for elevated platelets of 452, INR was 0.9.  His CEA was elevated at 34.3, CA 19-9 was normal 9 and LDH was mildly elevated at 242. Due to this, the patient underwent a PET scan on 09/08/2021 which was ordered by Dr. Delton Coombes.  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  Full report described below: FINDINGS: NECK: Mild asymmetry with respect to uptake at the LEFT base of tongue (image 29/3) maximum SUV 4.9 mild asymmetric fullness in this location of soft tissue. No hypermetabolic lymph nodes in the neck.  Incidental CT findings: none  CHEST: No hypermetabolic mediastinal or hilar nodes. No suspicious pulmonary nodules on the CT scan.  Incidental CT findings: Aortic atherosclerosis and signs of coronary artery disease. Basilar atelectasis without effusion or consolidation. No suspicious pulmonary nodule. Top normal heart size.  ABDOMEN/PELVIS: FDG avid disease in the liver and peritoneum as well as upper abdominal lymph nodes.  (Image 120/3) area measuring up to 6.5 x 5.4 cm in hepatic subsegment IV showing activity that extends beyond the liver margin to involve the gastric antrum adjacent with a maximum SUV of 19.  Hepatic subsegment V (image 118/3) area measuring approximately 4.9 cm greatest axial dimension with a maximum SUV up to 16.  Hepatic subsegment VIII low attenuation near the dome of the RIGHT hemi liver difficult to localize but with approximately 18 mm greatest axial dimension and a maximum  SUV of 6.3. Is ascites along the liver margin. Nodularity along the liver margin.  (Image 121/3) gastrohepatic lymph node measuring 16 mm short axis with a maximum SUV of 8.1.  Diffuse peritoneal and omental nodularity. Thickening of mesenteric planes. FDG avid omental caking (image 156/3 is an example with infiltration of the omentum without measurable lesion, discrete lesion but with a maximum SUV of 8.0. Nodularity in small volume fluid extends along the LEFT and RIGHT pericolic gutter and into the pelvis. Small volume pelvic fluid with similar volume compared to recent imaging with some increased metabolic activity in the dependent aspect of pelvic fluid.  Incidental CT findings: Cholelithiasis and sludge in the gallbladder. No acute upper abdominal process. Gallbladder is slightly more distended than on previous imaging. No signs of bowel obstruction. Colonic diverticulosis.  SKELETON: No focal hypermetabolic activity to suggest skeletal metastasis.  Incidental CT findings: None aside from RIGHT hip arthroplasty and spinal degenerative changes.  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  Gastric antral involvement from peritoneal disease is contiguous with hepatic metastatic disease.  Nodal involvement in the upper abdomen.  Mild fullness of LEFT base of tongue and mildly asymmetric activity in this area, consider correlation with direct visualization to exclude mucosal lesion as warranted. Increasing distension of the gallbladder with cholelithiasis. Correlate with any RIGHT upper quadrant symptoms with worsening and consider dedicated assessment with ultrasound as warranted. Stranding and nodularity throughout the peritoneum including about the gallbladder is related to peritoneal carcinomatosis.  Patient subsequently underwent a paracentesis on 09/15/2021 with drainage of 1.5 L of fluid.  Cytology report is pending.  He underwent  liver biopsy of the lesion on the same day with pathology report pending.He reports that after he had the ascites drained his pressure has much more improved.  Patient was seen by Dr. Delton Coombes on 09/01/2021. Has MRI brain scheduled on 09/21/2021 - this was ordered for evaluation of episodes of "briefly blacking out" without having any falls.  Patient reports that he only takes Tylenol for pain and very occasionally he takes tramadol.  The patient denies having any nausea, vomiting, fever, chills, hematochezia, melena, hematemesis, abdominal distention, abdominal pain, diarrhea, jaundice, pruritus. Has lost 20 lb in the last  couple of months - states this was on purpose.  Last OEU:MPNTI Last Colonoscopy: 2012 Examination performed to cecum. Prep overall was satisfactory but fair in the cecum. Pancolonic diverticulosis. 4 mm polyp ablated via cold biopsy from descending colon.  FHx: neg for any gastrointestinal/liver disease, father had MDS, not other family members with cancer Social: neg smoking, alcohol or illicit drug use Surgical: no abdominal surgeries  Past Medical History: Past Medical History:  Diagnosis Date   Anxiety    Bladder cancer ([place]) 2011   CAD (coronary artery disease), native coronary artery    a. Mildly elevated troponin 03/2013, cath with nonobstructive disease including 50% AV groove distal stenosis before large OM   Essential hypertension    Headache(784.0)    History of migraines   Hyperglycemia    Mixed hyperlipidemia    Neuropathy    Obesity    Seasonal allergies    Sleep apnea    On CPAP    Past  Surgical History: Past Surgical History:  Procedure Laterality Date   COLONOSCOPY  06/28/2011   Procedure: COLONOSCOPY;  Surgeon: Rogene Houston, MD;  Location: [facility];  Service: Endoscopy;  Laterality: N/A;   LEFT HEART CATHETERIZATION WITH CORONARY ANGIOGRAM N/A 03/31/2013   Procedure: LEFT HEART CATHETERIZATION WITH CORONARY ANGIOGRAM;  Surgeon:  Minus Breeding, MD;  Location: [facility] CATH LAB;  Service: Cardiovascular;  Laterality: N/A;   TURBT  2011    Family History: Family History  Problem Relation Age of Onset   COPD Father     Social History: Social History   Tobacco Use  Smoking Status Former   Packs/day: 0.50   Years: 10.00   Pack years: 5.00   Types: Cigarettes   Quit date: 07/10/2011   Years since quitting: 10.2  Smokeless Tobacco Never  Tobacco Comments   Quit several yrs prior to 03/2013.   Social History   Substance and Sexual Activity  Alcohol Use Yes   Alcohol/week: 0.0 standard drinks   Comment: Occasional   Social History   Substance and Sexual Activity  Drug Use No    Allergies: No Known Allergies  Medications: Current Outpatient Medications  Medication Sig Dispense Refill   aspirin EC 81 MG tablet Take 81 mg by mouth daily.     buPROPion (WELLBUTRIN XL) 300 MG 24 hr tablet bupropion HCl XL 300 mg 24 hr tablet, extended release     cyclobenzaprine (FLEXERIL) 10 MG tablet Take 10 mg by mouth at bedtime.     diclofenac (VOLTAREN) 75 MG EC tablet Take 75 mg by mouth 2 (two) times daily.     eszopiclone (LUNESTA) 2 MG TABS tablet Take 2 mg by mouth at bedtime as needed for sleep. Take immediately before bedtime     fenofibrate 160 MG tablet Take 160 mg by mouth daily.     FLUoxetine (PROZAC) 10 MG tablet Take 10 mg by mouth daily.     gabapentin (NEURONTIN) 300 MG capsule Take 600 mg by mouth every 8 (eight) hours as needed.     metFORMIN (GLUCOPHAGE-XR) 500 MG 24 hr tablet Take 500 mg by mouth 2 (two) times daily.     nitroGLYCERIN (NITROSTAT) 0.4 MG SL tablet Place 1 tablet (0.4 mg total) under the tongue every 5 (five) minutes as needed for chest pain. 25 tablet 3   olmesartan (BENICAR) 20 MG tablet Take 20 mg by mouth daily.     omeprazole (PRILOSEC) 40 MG capsule Take 40 mg by mouth daily.     pramipexole (MIRAPEX) 0.5 MG tablet Take 0.5 mg by mouth 3 (three) times daily.      RYBELSUS 7  MG TABS Take 1 tablet by mouth daily.     tamsulosin (FLOMAX) 0.4 MG CAPS capsule Take 0.4 mg by mouth daily.     traMADol (ULTRAM) 50 MG tablet Take 50 mg by mouth daily as needed for moderate pain.      No current facility-administered medications for this visit.    Review of Systems: GENERAL: negative for malaise, night sweats HEENT: No changes in hearing or vision, no nose bleeds or other nasal problems. NECK: Negative for lumps, goiter, pain and significant neck swelling RESPIRATORY: Negative for cough, wheezing CARDIOVASCULAR: Negative for chest pain, leg swelling, palpitations, orthopnea GI: SEE HPI MUSCULOSKELETAL: Negative for joint pain or swelling, back pain, and muscle pain. SKIN: Negative for lesions, rash PSYCH: Negative for sleep disturbance, mood disorder and recent psychosocial stressors. HEMATOLOGY Negative for prolonged bleeding, bruising easily,  and swollen nodes. ENDOCRINE: Negative for cold or heat intolerance, polyuria, polydipsia and goiter. NEURO: negative for tremor, gait imbalance, syncope and seizures. The remainder of the review of systems is noncontributory.   Physical Exam: BP 117/77 (BP Location: Left Arm, Patient Position: Sitting, Cuff Size: Large)    Pulse 86    Temp (!) 97.2 F (36.2 C) (Oral)    Ht 6' (1.829 m)    Wt 262 lb 14.4 oz (119.3 kg)    BMI 35.66 kg/m  GENERAL: The patient is AO x3, in no acute distress. HEENT: Head is normocephalic and atraumatic. EOMI are intact. Mouth is well hydrated and without lesions. NECK: Supple. No masses LUNGS: Clear to auscultation. No presence of rhonchi/wheezing/rales. Adequate chest expansion HEART: RRR, normal s1 and s2. ABDOMEN: Soft, nontender, no guarding, no peritoneal signs, and nondistended. BS +. No masses but there is some nodularity diffusely, particularly in mid abdomen. EXTREMITIES: Without any cyanosis, clubbing, rash, lesions or edema. NEUROLOGIC: AOx3, no focal motor deficit. SKIN: no  jaundice, no rashes   Imaging/Labs: as above  I personally reviewed and interpreted the available labs, imaging and endoscopic files.  Impression and Plan: Marvin Carr is a 66 y.o. male with PMH bladder cancer s/p local resection with TURBT and local chemo, OSA, HLD, anxiety, CAD, HTN, who presents for evaluation of change in bowel movements and liver lesions concerning for metastatic cancer.  The patient has had an extensive investigation for her newly found lesions in her liver.  Overall, her imaging findings are highly suggestive of metastatic malignancy to the liver of unknown primary with extension to the peritoneal cavity.  There is also a CT we cytology and a liver biopsy are still pending which will provide important information regarding the etiology and potential treatment.  I do consider that he would be important for him to have further evaluation with an EGD and colonoscopy identify the primary tumor, especially as he has had changes in his bowel movements recently.  It is unclear where is the primary tumor at this point, although there is no elevated CEA level which may suggest colorectal location.  We will also evaluate the base of his tongue briefly during his examination given the PET findings, although if it is difficult from a clinical perspective we may consider sending him to an ENT doctor for further evaluation.  I do agree that given his new episodes of neurologic alterations, he may benefit from undergoing an MRI of the brain.  He will need to follow-up with Dr. Delton Coombes as scheduled regarding his possible malignancy, I will update him about the findings on EGD and colonoscopy.  Had an extensive discussion with the patient regarding the possibility that he has a malignancy to explain his current presentation.  He understood and looks forward to have the rest of his investigations performed and to be reached with the pathology results.  He will benefit for now from taking  MiraLAX to improve his bowel movement frequency.  - Start taking Miralax 1 capful every day for one week. If bowel movements do not improve, increase to 1 capful every 12 hours. If after two weeks there is no improvement, increase to 1 capful every 8 hours - Schedule EGD and colonoscopy - Follow up with Dr. Delton Coombes as scheduled - Proceed with MR brain - Follow up ascitic fluid cytology and liver biopsy results  All questions were answered.      Marvin Peppers, MD Gastroenterology and Hepatology [facility]

## 2021-09-19 NOTE — Telephone Encounter (Signed)
Volney Reierson Ann Sachin Ferencz, CMA  ?

## 2021-09-19 NOTE — H&P (View-Only) (Signed)
Marvin Carr, M.D. Gastroenterology & Hepatology [facility] [address] [place], [place] [address] Primary Care Physician: Celene Squibb, MD [address] [place] [address]  Referring MD: PCP  Chief Complaint: Change in bowel movements, liver lesions concerning for metastatic cancer  History of Present Illness: Marvin Carr is a 66 y.o. male with PMH bladder cancer s/p local resection with TURBT and local chemo, OSA, HLD, anxiety, CAD, HTN, who presents for evaluation of change in bowel movements and liver lesions concerning for metastatic cancer.  Patient reports that he felt some "tightness" in his abdomen since 06/2021 which was unusual for him. He reports that he also had some tenderness in the LUQ at that time as well.  He denies having similar symptoms in the past.  Due to this, he was seen by his PCP and was initially referred to gastroenterology for further evaluation.  During the following months, he noticed very pale stools in November for a couple of days but it went back to its normal color afterwards. States he had some mild constipation in the past, but in [place] he noticed his BM frequency decreased to 1 bowel movement every 3-4 days - also noticed the stool had a pencil like appearance.  This was in usual for him as he used to have bowel movement every day.  As part of the evaluation of his symptoms he underwent CT of the abdomen and pelvis without contrast on 08/29/2021 which showed presence of an ill-defined low-density lesion measuring 6.1 cm in the medial segment of the left hepatic lobe, as well as an ill-defined lesion in the right hepatic lobe adjacent to the gallbladder fossa measuring 3.3 cm.  He was found to have gallstones in his fundus but no alterations.  Notably, there was significant nodularity throughout the peritoneum which was concerning for carcinomatosis.  There was also presence of moderate  free fluid in the abdomen and pelvis.  CBC and CMP which were checked on 09/01/2021 which showed normal liver enzymes, electrolytes and renal function, CBC was only remarkable for elevated platelets of 452, INR was 0.9.  His CEA was elevated at 34.3, CA 19-9 was normal 9 and LDH was mildly elevated at 242. Due to this, the patient underwent a PET scan on 09/08/2021 which was ordered by Dr. Delton Coombes.  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  Full report described below: FINDINGS: NECK: Mild asymmetry with respect to uptake at the LEFT base of tongue (image 29/3) maximum SUV 4.9 mild asymmetric fullness in this location of soft tissue. No hypermetabolic lymph nodes in the neck.  Incidental CT findings: none  CHEST: No hypermetabolic mediastinal or hilar nodes. No suspicious pulmonary nodules on the CT scan.  Incidental CT findings: Aortic atherosclerosis and signs of coronary artery disease. Basilar atelectasis without effusion or consolidation. No suspicious pulmonary nodule. Top normal heart size.  ABDOMEN/PELVIS: FDG avid disease in the liver and peritoneum as well as upper abdominal lymph nodes.  (Image 120/3) area measuring up to 6.5 x 5.4 cm in hepatic subsegment IV showing activity that extends beyond the liver margin to involve the gastric antrum adjacent with a maximum SUV of 19.  Hepatic subsegment V (image 118/3) area measuring approximately 4.9 cm greatest axial dimension with a maximum SUV up to 16.  Hepatic subsegment VIII low attenuation near the dome of the RIGHT hemi liver difficult to localize but with approximately 18 mm greatest axial dimension and a maximum  SUV of 6.3. Is ascites along the liver margin. Nodularity along the liver margin.  (Image 121/3) gastrohepatic lymph node measuring 16 mm short axis with a maximum SUV of 8.1.  Diffuse peritoneal and omental nodularity. Thickening of mesenteric planes. FDG avid omental caking (image 156/3 is an example with infiltration of the omentum without measurable lesion, discrete lesion but with a maximum SUV of 8.0. Nodularity in small volume fluid extends along the LEFT and RIGHT pericolic gutter and into the pelvis. Small volume pelvic fluid with similar volume compared to recent imaging with some increased metabolic activity in the dependent aspect of pelvic fluid.  Incidental CT findings: Cholelithiasis and sludge in the gallbladder. No acute upper abdominal process. Gallbladder is slightly more distended than on previous imaging. No signs of bowel obstruction. Colonic diverticulosis.  SKELETON: No focal hypermetabolic activity to suggest skeletal metastasis.  Incidental CT findings: None aside from RIGHT hip arthroplasty and spinal degenerative changes.  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  Gastric antral involvement from peritoneal disease is contiguous with hepatic metastatic disease.  Nodal involvement in the upper abdomen.  Mild fullness of LEFT base of tongue and mildly asymmetric activity in this area, consider correlation with direct visualization to exclude mucosal lesion as warranted. Increasing distension of the gallbladder with cholelithiasis. Correlate with any RIGHT upper quadrant symptoms with worsening and consider dedicated assessment with ultrasound as warranted. Stranding and nodularity throughout the peritoneum including about the gallbladder is related to peritoneal carcinomatosis.  Patient subsequently underwent a paracentesis on 09/15/2021 with drainage of 1.5 L of fluid.  Cytology report is pending.  He underwent  liver biopsy of the lesion on the same day with pathology report pending.He reports that after he had the ascites drained his pressure has much more improved.  Patient was seen by Dr. Delton Coombes on 09/01/2021. Has MRI brain scheduled on 09/21/2021 - this was ordered for evaluation of episodes of "briefly blacking out" without having any falls.  Patient reports that he only takes Tylenol for pain and very occasionally he takes tramadol.  The patient denies having any nausea, vomiting, fever, chills, hematochezia, melena, hematemesis, abdominal distention, abdominal pain, diarrhea, jaundice, pruritus. Has lost 20 lb in the last  couple of months - states this was on purpose.  Last FAO:ZHYQM Last Colonoscopy: 2012 Examination performed to cecum. Prep overall was satisfactory but fair in the cecum. Pancolonic diverticulosis. 4 mm polyp ablated via cold biopsy from descending colon.  FHx: neg for any gastrointestinal/liver disease, father had MDS, not other family members with cancer Social: neg smoking, alcohol or illicit drug use Surgical: no abdominal surgeries  Past Medical History: Past Medical History:  Diagnosis Date   Anxiety    Bladder cancer ([place]) 2011   CAD (coronary artery disease), native coronary artery    a. Mildly elevated troponin 03/2013, cath with nonobstructive disease including 50% AV groove distal stenosis before large OM   Essential hypertension    Headache(784.0)    History of migraines   Hyperglycemia    Mixed hyperlipidemia    Neuropathy    Obesity    Seasonal allergies    Sleep apnea    On CPAP    Past  Surgical History: Past Surgical History:  Procedure Laterality Date   COLONOSCOPY  06/28/2011   Procedure: COLONOSCOPY;  Surgeon: Rogene Houston, MD;  Location: [facility];  Service: Endoscopy;  Laterality: N/A;   LEFT HEART CATHETERIZATION WITH CORONARY ANGIOGRAM N/A 03/31/2013   Procedure: LEFT HEART CATHETERIZATION WITH CORONARY ANGIOGRAM;  Surgeon:  Minus Breeding, MD;  Location: [facility] CATH LAB;  Service: Cardiovascular;  Laterality: N/A;   TURBT  2011    Family History: Family History  Problem Relation Age of Onset   COPD Father     Social History: Social History   Tobacco Use  Smoking Status Former   Packs/day: 0.50   Years: 10.00   Pack years: 5.00   Types: Cigarettes   Quit date: 07/10/2011   Years since quitting: 10.2  Smokeless Tobacco Never  Tobacco Comments   Quit several yrs prior to 03/2013.   Social History   Substance and Sexual Activity  Alcohol Use Yes   Alcohol/week: 0.0 standard drinks   Comment: Occasional   Social History   Substance and Sexual Activity  Drug Use No    Allergies: No Known Allergies  Medications: Current Outpatient Medications  Medication Sig Dispense Refill   aspirin EC 81 MG tablet Take 81 mg by mouth daily.     buPROPion (WELLBUTRIN XL) 300 MG 24 hr tablet bupropion HCl XL 300 mg 24 hr tablet, extended release     cyclobenzaprine (FLEXERIL) 10 MG tablet Take 10 mg by mouth at bedtime.     diclofenac (VOLTAREN) 75 MG EC tablet Take 75 mg by mouth 2 (two) times daily.     eszopiclone (LUNESTA) 2 MG TABS tablet Take 2 mg by mouth at bedtime as needed for sleep. Take immediately before bedtime     fenofibrate 160 MG tablet Take 160 mg by mouth daily.     FLUoxetine (PROZAC) 10 MG tablet Take 10 mg by mouth daily.     gabapentin (NEURONTIN) 300 MG capsule Take 600 mg by mouth every 8 (eight) hours as needed.     metFORMIN (GLUCOPHAGE-XR) 500 MG 24 hr tablet Take 500 mg by mouth 2 (two) times daily.     nitroGLYCERIN (NITROSTAT) 0.4 MG SL tablet Place 1 tablet (0.4 mg total) under the tongue every 5 (five) minutes as needed for chest pain. 25 tablet 3   olmesartan (BENICAR) 20 MG tablet Take 20 mg by mouth daily.     omeprazole (PRILOSEC) 40 MG capsule Take 40 mg by mouth daily.     pramipexole (MIRAPEX) 0.5 MG tablet Take 0.5 mg by mouth 3 (three) times daily.      RYBELSUS 7  MG TABS Take 1 tablet by mouth daily.     tamsulosin (FLOMAX) 0.4 MG CAPS capsule Take 0.4 mg by mouth daily.     traMADol (ULTRAM) 50 MG tablet Take 50 mg by mouth daily as needed for moderate pain.      No current facility-administered medications for this visit.    Review of Systems: GENERAL: negative for malaise, night sweats HEENT: No changes in hearing or vision, no nose bleeds or other nasal problems. NECK: Negative for lumps, goiter, pain and significant neck swelling RESPIRATORY: Negative for cough, wheezing CARDIOVASCULAR: Negative for chest pain, leg swelling, palpitations, orthopnea GI: SEE HPI MUSCULOSKELETAL: Negative for joint pain or swelling, back pain, and muscle pain. SKIN: Negative for lesions, rash PSYCH: Negative for sleep disturbance, mood disorder and recent psychosocial stressors. HEMATOLOGY Negative for prolonged bleeding, bruising easily,  and swollen nodes. ENDOCRINE: Negative for cold or heat intolerance, polyuria, polydipsia and goiter. NEURO: negative for tremor, gait imbalance, syncope and seizures. The remainder of the review of systems is noncontributory.   Physical Exam: BP 117/77 (BP Location: Left Arm, Patient Position: Sitting, Cuff Size: Large)    Pulse 86    Temp (!) 97.2 F (36.2 C) (Oral)    Ht 6' (1.829 m)    Wt 262 lb 14.4 oz (119.3 kg)    BMI 35.66 kg/m  GENERAL: The patient is AO x3, in no acute distress. HEENT: Head is normocephalic and atraumatic. EOMI are intact. Mouth is well hydrated and without lesions. NECK: Supple. No masses LUNGS: Clear to auscultation. No presence of rhonchi/wheezing/rales. Adequate chest expansion HEART: RRR, normal s1 and s2. ABDOMEN: Soft, nontender, no guarding, no peritoneal signs, and nondistended. BS +. No masses but there is some nodularity diffusely, particularly in mid abdomen. EXTREMITIES: Without any cyanosis, clubbing, rash, lesions or edema. NEUROLOGIC: AOx3, no focal motor deficit. SKIN: no  jaundice, no rashes   Imaging/Labs: as above  I personally reviewed and interpreted the available labs, imaging and endoscopic files.  Impression and Plan: Marvin Carr is a 66 y.o. male with PMH bladder cancer s/p local resection with TURBT and local chemo, OSA, HLD, anxiety, CAD, HTN, who presents for evaluation of change in bowel movements and liver lesions concerning for metastatic cancer.  The patient has had an extensive investigation for her newly found lesions in her liver.  Overall, her imaging findings are highly suggestive of metastatic malignancy to the liver of unknown primary with extension to the peritoneal cavity.  There is also a CT we cytology and a liver biopsy are still pending which will provide important information regarding the etiology and potential treatment.  I do consider that he would be important for him to have further evaluation with an EGD and colonoscopy identify the primary tumor, especially as he has had changes in his bowel movements recently.  It is unclear where is the primary tumor at this point, although there is no elevated CEA level which may suggest colorectal location.  We will also evaluate the base of his tongue briefly during his examination given the PET findings, although if it is difficult from a clinical perspective we may consider sending him to an ENT doctor for further evaluation.  I do agree that given his new episodes of neurologic alterations, he may benefit from undergoing an MRI of the brain.  He will need to follow-up with Dr. Delton Coombes as scheduled regarding his possible malignancy, I will update him about the findings on EGD and colonoscopy.  Had an extensive discussion with the patient regarding the possibility that he has a malignancy to explain his current presentation.  He understood and looks forward to have the rest of his investigations performed and to be reached with the pathology results.  He will benefit for now from taking  MiraLAX to improve his bowel movement frequency.  - Start taking Miralax 1 capful every day for one week. If bowel movements do not improve, increase to 1 capful every 12 hours. If after two weeks there is no improvement, increase to 1 capful every 8 hours - Schedule EGD and colonoscopy - Follow up with Dr. Delton Coombes as scheduled - Proceed with MR brain - Follow up ascitic fluid cytology and liver biopsy results  All questions were answered.      Marvin Peppers, MD Gastroenterology and Hepatology [facility]

## 2021-09-19 NOTE — Patient Instructions (Signed)
Start taking Miralax 1 capful every day for one week. If bowel movements do not improve, increase to 1 capful every 12 hours. If after two weeks there is no improvement, increase to 1 capful every 8 hours Schedule EGD and colonoscopy Follow up with Dr. Delton Coombes as scheduled Proceed with MR brain

## 2021-09-21 ENCOUNTER — Other Ambulatory Visit: Payer: Self-pay

## 2021-09-21 ENCOUNTER — Ambulatory Visit (HOSPITAL_COMMUNITY)
Admission: RE | Admit: 2021-09-21 | Discharge: 2021-09-21 | Disposition: A | Discharge status: Home / Self Care | Payer: Medicare Other | Source: Ambulatory Visit | Attending: Hematology | Admitting: Hematology

## 2021-09-21 DIAGNOSIS — C787 Secondary malignant neoplasm of liver and intrahepatic bile duct: Secondary | ICD-10-CM | POA: Diagnosis not present

## 2021-09-21 DIAGNOSIS — R413 Other amnesia: Secondary | ICD-10-CM | POA: Diagnosis present

## 2021-09-21 MED ORDER — GADOBUTROL 1 MMOL/ML IV SOLN
10.0000 mL | Freq: Once | INTRAVENOUS | Status: AC | PRN
  Administered 2021-09-21: 10 mL via INTRAVENOUS

## 2021-09-23 ENCOUNTER — Encounter (HOSPITAL_COMMUNITY)
Admission: RE | Disposition: A | Discharge status: Home / Self Care | Payer: Self-pay | Source: Home / Self Care | Attending: Gastroenterology

## 2021-09-23 ENCOUNTER — Encounter (HOSPITAL_COMMUNITY): Payer: Self-pay | Admitting: Gastroenterology

## 2021-09-23 ENCOUNTER — Ambulatory Visit (HOSPITAL_COMMUNITY)
Admission: RE | Admit: 2021-09-23 | Discharge: 2021-09-23 | Disposition: A | Discharge status: Home / Self Care | Payer: Medicare Other | Attending: Gastroenterology | Admitting: Gastroenterology

## 2021-09-23 ENCOUNTER — Ambulatory Visit (HOSPITAL_COMMUNITY): Payer: Medicare Other | Admitting: Anesthesiology

## 2021-09-23 ENCOUNTER — Other Ambulatory Visit: Payer: Self-pay

## 2021-09-23 DIAGNOSIS — Z9221 Personal history of antineoplastic chemotherapy: Secondary | ICD-10-CM | POA: Diagnosis not present

## 2021-09-23 DIAGNOSIS — F419 Anxiety disorder, unspecified: Secondary | ICD-10-CM | POA: Insufficient documentation

## 2021-09-23 DIAGNOSIS — Z87891 Personal history of nicotine dependence: Secondary | ICD-10-CM | POA: Insufficient documentation

## 2021-09-23 DIAGNOSIS — E785 Hyperlipidemia, unspecified: Secondary | ICD-10-CM | POA: Insufficient documentation

## 2021-09-23 DIAGNOSIS — C787 Secondary malignant neoplasm of liver and intrahepatic bile duct: Secondary | ICD-10-CM | POA: Diagnosis not present

## 2021-09-23 DIAGNOSIS — I10 Essential (primary) hypertension: Secondary | ICD-10-CM | POA: Insufficient documentation

## 2021-09-23 DIAGNOSIS — D123 Benign neoplasm of transverse colon: Secondary | ICD-10-CM | POA: Diagnosis not present

## 2021-09-23 DIAGNOSIS — I2510 Atherosclerotic heart disease of native coronary artery without angina pectoris: Secondary | ICD-10-CM | POA: Insufficient documentation

## 2021-09-23 DIAGNOSIS — R194 Change in bowel habit: Secondary | ICD-10-CM | POA: Diagnosis present

## 2021-09-23 DIAGNOSIS — C482 Malignant neoplasm of peritoneum, unspecified: Secondary | ICD-10-CM | POA: Diagnosis not present

## 2021-09-23 DIAGNOSIS — G4733 Obstructive sleep apnea (adult) (pediatric): Secondary | ICD-10-CM | POA: Diagnosis not present

## 2021-09-23 DIAGNOSIS — K635 Polyp of colon: Secondary | ICD-10-CM

## 2021-09-23 DIAGNOSIS — Z8551 Personal history of malignant neoplasm of bladder: Secondary | ICD-10-CM | POA: Insufficient documentation

## 2021-09-23 DIAGNOSIS — K449 Diaphragmatic hernia without obstruction or gangrene: Secondary | ICD-10-CM

## 2021-09-23 DIAGNOSIS — R519 Headache, unspecified: Secondary | ICD-10-CM | POA: Diagnosis not present

## 2021-09-23 DIAGNOSIS — K5730 Diverticulosis of large intestine without perforation or abscess without bleeding: Secondary | ICD-10-CM

## 2021-09-23 DIAGNOSIS — K3189 Other diseases of stomach and duodenum: Secondary | ICD-10-CM | POA: Diagnosis not present

## 2021-09-23 HISTORY — DX: Prediabetes: R73.03

## 2021-09-23 HISTORY — PX: ESOPHAGOGASTRODUODENOSCOPY (EGD) WITH PROPOFOL: SHX5813

## 2021-09-23 HISTORY — PX: COLONOSCOPY WITH PROPOFOL: SHX5780

## 2021-09-23 HISTORY — PX: POLYPECTOMY: SHX5525

## 2021-09-23 HISTORY — PX: BIOPSY: SHX5522

## 2021-09-23 LAB — HM COLONOSCOPY

## 2021-09-23 SURGERY — COLONOSCOPY WITH PROPOFOL
Anesthesia: General

## 2021-09-23 MED ORDER — PROPOFOL 10 MG/ML IV BOLUS
INTRAVENOUS | Status: DC | PRN
  Administered 2021-09-23: 50 mg via INTRAVENOUS
  Administered 2021-09-23: 100 mg via INTRAVENOUS

## 2021-09-23 MED ORDER — LACTATED RINGERS IV SOLN: INTRAVENOUS | Status: DC

## 2021-09-23 MED ORDER — PHENYLEPHRINE HCL (PRESSORS) 10 MG/ML IV SOLN
INTRAVENOUS | Status: DC | PRN
  Administered 2021-09-23 (×4): 100 ug via INTRAVENOUS

## 2021-09-23 MED ORDER — LIDOCAINE HCL (CARDIAC) PF 100 MG/5ML IV SOSY
PREFILLED_SYRINGE | INTRAVENOUS | Status: DC | PRN
  Administered 2021-09-23: 50 mg via INTRAVENOUS

## 2021-09-23 MED ORDER — PROPOFOL 500 MG/50ML IV EMUL
INTRAVENOUS | Status: DC | PRN
  Administered 2021-09-23: 150 ug/kg/min via INTRAVENOUS

## 2021-09-23 NOTE — Discharge Instructions (Addendum)
You are being discharged to home.  Resume your previous diet.  We are waiting for your pathology results.  Follow up with Dr. Delton Coombes next week. Your physician has recommended a repeat colonoscopy for surveillance based on pathology results.

## 2021-09-23 NOTE — Interval H&P Note (Signed)
History and Physical Interval Note:  09/23/2021 8:07 AM  Marvin Carr  has presented today for surgery, with the diagnosis of metastatic cancer of unknown.  The various methods of treatment have been discussed with the patient and family. After consideration of risks, benefits and other options for treatment, the patient has consented to  Procedure(s) with comments: COLONOSCOPY WITH PROPOFOL (N/A) - 940 ESOPHAGOGASTRODUODENOSCOPY (EGD) WITH PROPOFOL (N/A) as a surgical intervention.  The patient's history has been reviewed, patient examined, no change in status, stable for surgery.  I have reviewed the patient's chart and labs.  Questions were answered to the patient's satisfaction.     Maylon Peppers [place]a

## 2021-09-23 NOTE — Op Note (Signed)
[facility] Patient Name: Marvin Carr Procedure Date: 09/23/2021 9:33 AM MRN: [id number] Date of Birth: 15-Apr-1956 Attending MD: Maylon Peppers ,  CSN: [id number] Age: 66 Admit Type: Outpatient Procedure:                Upper GI endoscopy Indications:              Metastatic malignancy to liver, Peritoneal                            malignancy Providers:                Maylon Peppers, Lambert Mody, Aram Candela Referring MD:              Medicines:                Monitored Anesthesia Care Complications:            No immediate complications. Estimated Blood Loss:     Estimated blood loss: none. Procedure:                Pre-Anesthesia Assessment:                           - Prior to the procedure, a History and Physical                            was performed, and patient medications, allergies                            and sensitivities were reviewed. The patient's                            tolerance of previous anesthesia was reviewed.                           - The risks and benefits of the procedure and the                            sedation options and risks were discussed with the                            patient. All questions were answered and informed                            consent was obtained.                           - ASA Grade Assessment: III - A patient with severe                            systemic disease.                           After obtaining informed consent, the endoscope was                            passed under direct vision. Throughout the  procedure, the patient's blood pressure, pulse, and                            oxygen saturations were monitored continuously. The                            [id number]-H190 ([id number]) scope was introduced through the                            mouth, and advanced to the second part of duodenum.                            The upper GI endoscopy was accomplished without                             difficulty. The patient tolerated the procedure                            well. Scope In: 9:47:24 AM Scope Out: 9:58:51 AM Total Procedure Duration: 0 hours 11 minutes 27 seconds  Findings:      A 2 cm hiatal hernia was present.      The exam of the esophagus was otherwise normal.      Diffuse congested mucosa was found in the gastric antrum. No ulcerations       or protuberant abnormalities were found, although the glans looked       slightly different from the rest of the gastric mucosa. Biopsies were       taken with a cold forceps for histology.      The examined duodenum was normal. Impression:               - 2 cm hiatal hernia.                           - Congestive gastropathy. Biopsied.                           - Normal examined duodenum. Moderate Sedation:      Per Anesthesia Care Recommendation:           - Discharge patient to home (ambulatory).                           - Resume previous diet.                           - Await pathology results.                           - Follow up with Dr. Delton Coombes next week. Procedure Code(s):        --- Professional ---                           [phone], Esophagogastroduodenoscopy, flexible,                            transoral; with biopsy, single or multiple Diagnosis Code(s):        ---  Professional ---                           K44.9, Diaphragmatic hernia without obstruction or                            gangrene                           K31.89, Other diseases of stomach and duodenum                           C78.7, Secondary malignant neoplasm of liver and                            intrahepatic bile duct                           C48.2, Malignant neoplasm of peritoneum, unspecified CPT copyright 2019 [employer]. All rights reserved. The codes documented in this report are preliminary and upon coder review may  be revised to meet current compliance requirements. Maylon Peppers, MD Maylon Peppers,  09/23/2021 10:02:57 AM This report has been signed electronically. Number of Addenda: 0

## 2021-09-23 NOTE — Op Note (Addendum)
[facility] Patient Name: Marvin Carr Procedure Date: 09/23/2021 9:26 AM MRN: [id number] Date of Birth: 06-Apr-1956 Attending MD: Maylon Peppers ,  CSN: [id number] Age: 66 Admit Type: Outpatient Procedure:                Colonoscopy Indications:              Metastatic malignancy to liver, Peritoneal                            malignancy Providers:                Maylon Peppers, Lambert Mody, Aram Candela Referring MD:              Medicines:                Monitored Anesthesia Care Complications:            No immediate complications. Estimated Blood Loss:     Estimated blood loss: none. Procedure:                Pre-Anesthesia Assessment:                           - Prior to the procedure, a History and Physical                            was performed, and patient medications, allergies                            and sensitivities were reviewed. The patient's                            tolerance of previous anesthesia was reviewed.                           - The risks and benefits of the procedure and the                            sedation options and risks were discussed with the                            patient. All questions were answered and informed                            consent was obtained.                           After obtaining informed consent, the colonoscope                            was passed under direct vision. Throughout the                            procedure, the patient's blood pressure, pulse, and                            oxygen saturations were monitored continuously. The  [id number]-HQ190L ([id number]) scope was introduced through                            the anus and advanced to the the cecum, identified                            by appendiceal orifice and ileocecal valve. The                            colonoscopy was performed without difficulty. The                            patient tolerated the  procedure well. The quality                            of the bowel preparation was good. Scope In: 10:04:13 AM Scope Out: 10:27:17 AM Scope Withdrawal Time: 0 hours 16 minutes 39 seconds  Total Procedure Duration: 0 hours 23 minutes 4 seconds  Findings:      The perianal and digital rectal examinations were normal.      A 6 mm polyp was found in the proximal transverse colon. The polyp was       semi-sessile. The polyp was removed with a cold snare. Resection and       retrieval were complete.      Multiple small and large-mouthed diverticula were found in the sigmoid       colon and descending colon.      The retroflexed view of the distal rectum and anal verge was normal and       showed no anal or rectal abnormalities.      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 Impression:               - One 6 mm polyp in the proximal transverse colon,                            removed with a cold snare. Resected and retrieved.                           - Diverticulosis in the sigmoid colon and in the                            descending colon.                           - The distal rectum and anal verge are normal on                            retroflexion view.                           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 Moderate Sedation:      Per Anesthesia Care Recommendation:           - Discharge patient to home (ambulatory).                           - Resume previous diet.                           - Await pathology results.                           - Repeat colonoscopy for surveillance based on                            pathology results. Procedure Code(s):        --- Professional ---                           [phone], Colonoscopy, flexible; with removal of                             tumor(s), polyp(s), or other lesion(s) by snare                            technique Diagnosis Code(s):        --- Professional ---                           K63.5, Polyp of colon                           C78.7, Secondary malignant neoplasm of liver and                            intrahepatic bile duct                           C48.2, Malignant neoplasm of peritoneum, unspecified                           K57.30, Diverticulosis of large intestine without                            perforation or abscess without bleeding CPT copyright 2019 [employer]. All rights reserved. The codes documented in this report are preliminary and upon coder review may  be revised to meet current compliance requirements. Maylon Peppers, MD Maylon Peppers,  09/23/2021 10:32:35 AM This report has been signed electronically. Number of Addenda: 0

## 2021-09-23 NOTE — Anesthesia Postprocedure Evaluation (Signed)
Anesthesia Post Note  Patient: Arye Weyenberg  Procedure(s) Performed: COLONOSCOPY WITH PROPOFOL ESOPHAGOGASTRODUODENOSCOPY (EGD) WITH PROPOFOL BIOPSY POLYPECTOMY  Patient location during evaluation: Phase II Anesthesia Type: General Level of consciousness: awake Pain management: pain level controlled Vital Signs Assessment: post-procedure vital signs reviewed and stable Respiratory status: spontaneous breathing and respiratory function stable Cardiovascular status: blood pressure returned to baseline and stable Postop Assessment: no headache and no apparent nausea or vomiting Anesthetic complications: no Comments: Late entry   No notable events documented.   Last Vitals:  Vitals:   09/23/21 1031 09/23/21 1034  BP: (!) 92/57 113/76  Pulse: 82 84  Resp: (!) 26 (!) 22  Temp: 36.4 C   SpO2: 96% 96%    Last Pain:  Vitals:   09/23/21 1034  TempSrc:   PainSc: 0-No pain                 Louann Sjogren

## 2021-09-23 NOTE — Anesthesia Preprocedure Evaluation (Signed)
Anesthesia Evaluation  Patient identified by MRN, date of birth, ID band Patient awake    Reviewed: Allergy & Precautions, H&P , NPO status , Patient's Chart, lab work & pertinent test results, reviewed documented beta blocker date and time   Airway Mallampati: II  TM Distance: >3 FB Neck ROM: full    Dental no notable dental hx.    Pulmonary neg pulmonary ROS, sleep apnea and Continuous Positive Airway Pressure Ventilation , former smoker,    Pulmonary exam normal breath sounds clear to auscultation       Cardiovascular Exercise Tolerance: Good hypertension, + CAD  negative cardio ROS   Rhythm:regular Rate:Normal     Neuro/Psych  Headaches, Anxiety negative neurological ROS  negative psych ROS   GI/Hepatic negative GI ROS, Neg liver ROS,   Endo/Other  negative endocrine ROS  Renal/GU negative Renal ROS  negative genitourinary   Musculoskeletal   Abdominal   Peds  Hematology negative hematology ROS (+)   Anesthesia Other Findings   Reproductive/Obstetrics negative OB ROS                             Anesthesia Physical Anesthesia Plan  ASA: 3  Anesthesia Plan: General   Post-op Pain Management:    Induction:   PONV Risk Score and Plan: Propofol infusion  Airway Management Planned:   Additional Equipment:   Intra-op Plan:   Post-operative Plan:   Informed Consent: I have reviewed the patients History and Physical, chart, labs and discussed the procedure including the risks, benefits and alternatives for the proposed anesthesia with the patient or authorized representative who has indicated his/her understanding and acceptance.     Dental Advisory Given  Plan Discussed with: CRNA  Anesthesia Plan Comments:         Anesthesia Quick Evaluation

## 2021-09-23 NOTE — Transfer of Care (Signed)
Immediate Anesthesia Transfer of Care Note  Patient: Marvin Carr  Procedure(s) Performed: COLONOSCOPY WITH PROPOFOL ESOPHAGOGASTRODUODENOSCOPY (EGD) WITH PROPOFOL BIOPSY POLYPECTOMY  Patient Location: PACU  Anesthesia Type:General  Level of Consciousness: awake, alert , oriented and patient cooperative  Airway & Oxygen Therapy: Patient Spontanous Breathing  Post-op Assessment: Report given to RN, Post -op Vital signs reviewed and stable and Patient moving all extremities X 4  Post vital signs: Reviewed and stable  Last Vitals:  Vitals Value Taken Time  BP 92/57 09/23/21 1031  Temp 36.4 C 09/23/21 1031  Pulse 82 09/23/21 1031  Resp 26 09/23/21 1031  SpO2 96 % 09/23/21 1031    Last Pain:  Vitals:   09/23/21 1031  TempSrc: Oral  PainSc: 0-No pain      Patients Stated Pain Goal: 7 (16/10/96 0454)  Complications: No notable events documented.

## 2021-09-26 ENCOUNTER — Encounter (HOSPITAL_COMMUNITY): Payer: Self-pay

## 2021-09-26 ENCOUNTER — Other Ambulatory Visit: Payer: Self-pay

## 2021-09-26 ENCOUNTER — Encounter (HOSPITAL_COMMUNITY): Payer: Self-pay | Admitting: Gastroenterology

## 2021-09-26 ENCOUNTER — Inpatient Hospital Stay (HOSPITAL_COMMUNITY): Payer: Medicare Other | Attending: Hematology | Admitting: Hematology

## 2021-09-26 VITALS — BP 137/87 | HR 92 | Temp 98.0°F | Resp 20 | Wt 266.0 lb

## 2021-09-26 DIAGNOSIS — Z808 Family history of malignant neoplasm of other organs or systems: Secondary | ICD-10-CM | POA: Insufficient documentation

## 2021-09-26 DIAGNOSIS — K5900 Constipation, unspecified: Secondary | ICD-10-CM | POA: Insufficient documentation

## 2021-09-26 DIAGNOSIS — Z809 Family history of malignant neoplasm, unspecified: Secondary | ICD-10-CM | POA: Diagnosis not present

## 2021-09-26 DIAGNOSIS — Z8551 Personal history of malignant neoplasm of bladder: Secondary | ICD-10-CM | POA: Insufficient documentation

## 2021-09-26 DIAGNOSIS — C786 Secondary malignant neoplasm of retroperitoneum and peritoneum: Secondary | ICD-10-CM | POA: Insufficient documentation

## 2021-09-26 DIAGNOSIS — C787 Secondary malignant neoplasm of liver and intrahepatic bile duct: Secondary | ICD-10-CM | POA: Diagnosis not present

## 2021-09-26 DIAGNOSIS — Z8042 Family history of malignant neoplasm of prostate: Secondary | ICD-10-CM | POA: Diagnosis not present

## 2021-09-26 DIAGNOSIS — Z5112 Encounter for antineoplastic immunotherapy: Secondary | ICD-10-CM | POA: Diagnosis not present

## 2021-09-26 DIAGNOSIS — Z79899 Other long term (current) drug therapy: Secondary | ICD-10-CM | POA: Insufficient documentation

## 2021-09-26 DIAGNOSIS — R180 Malignant ascites: Secondary | ICD-10-CM | POA: Diagnosis not present

## 2021-09-26 DIAGNOSIS — G893 Neoplasm related pain (acute) (chronic): Secondary | ICD-10-CM | POA: Insufficient documentation

## 2021-09-26 DIAGNOSIS — C221 Intrahepatic bile duct carcinoma: Secondary | ICD-10-CM | POA: Diagnosis present

## 2021-09-26 DIAGNOSIS — K30 Functional dyspepsia: Secondary | ICD-10-CM | POA: Diagnosis not present

## 2021-09-26 DIAGNOSIS — Z87891 Personal history of nicotine dependence: Secondary | ICD-10-CM | POA: Diagnosis not present

## 2021-09-26 DIAGNOSIS — R109 Unspecified abdominal pain: Secondary | ICD-10-CM | POA: Insufficient documentation

## 2021-09-26 MED ORDER — LACTULOSE 20 GM/30ML PO SOLN: 30.0000 mL | Freq: Every day | ORAL | 6 refills | Status: DC

## 2021-09-26 NOTE — Progress Notes (Signed)
Caris testing requested on [id number] per Dr. Delton Coombes

## 2021-09-26 NOTE — Progress Notes (Signed)
START OFF PATHWAY REGIMEN - Other ° ° °[id number]:Cisplatin IV D1,8 + Durvalumab 1,500 mg IV D1 + Gemcitabine IV D1,8 q21 Days for up to 8 Cycles Followed by Durvalumab 1,500 mg IV D1 q28 Days: °  Cycles 1 through up to 8: A cycle is every 21 days: °    Durvalumab  °    Gemcitabine  °    Cisplatin  °  Cycles 9 and beyond: A cycle is every 28 days: °    Durvalumab  ° °**Always confirm dose/schedule in your pharmacy ordering system** ° °Patient Characteristics: °Intent of Therapy: °Non-Curative / Palliative Intent, Discussed with Patient °

## 2021-09-26 NOTE — Patient Instructions (Addendum)
[place] at [facility] Discharge Instructions   You were seen and examined today by Dr. Delton Coombes.  He reviewed the results of your PET scan and biopsy results.  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  We will request special testing on your biopsy tissue to determine all treatment options available to you down the road, as this cancer is not curable, but controllable.  Chemotherapy is the first line treatment for this type of cancer. The drugs you'll receive are gemcitabine (Gemzar) and cisplatin.  The treatment regimen is scheduled on Day 1 and Day 8 every 21 days (2 weeks of treatment with 1 week off before starting treatment again).  There is also an immunotherapy drug that we give with this treatment called durvalumab (Imfinzi).  All three drugs are given together on Day 1.  Gemzar and cisplatin only are given on Day 8. We will schedule you for a chemotherapy education class prior to the start of treatment to review all these drugs and their side effects, as well as what to expect before and during treatments, after treatments, how to manage side effects at home, and when to call the clinic.   Continue stool softener as you have been.  We will also send you a prescription for Lactulose for constipation.   Return as scheduled for the above.    Thank you for choosing [place] at [facility] to provide your oncology and hematology care.  To afford each patient quality time with our provider, please arrive at least 15 minutes before your scheduled appointment time.   If you have a lab appointment with the [facility] please come in thru the Main Entrance and check in at the main information desk.  You need to re-schedule your appointment should you arrive 10 or more minutes late.  We strive to give you quality time with our providers, and arriving late affects you and other patients whose appointments are after yours.  Also, if you no  show three or more times for appointments you may be dismissed from the clinic at the providers discretion.     Again, thank you for choosing [facility].  Our hope is that these requests will decrease the amount of time that you wait before being seen by our physicians.       _____________________________________________________________  Should you have questions after your visit to [facility], please contact our office at [phone] and follow the prompts.  Our office hours are 8:00 a.m. and 4:30 p.m. Monday - Friday.  Please note that voicemails left after 4:00 p.m. may not be returned until the following business day.  We are closed weekends and major holidays.  You do have access to a nurse 24-7, just call the main number to the clinic [phone] and do not press any options, hold on the line and a nurse will answer the phone.    For prescription refill requests, have your pharmacy contact our office and allow 72 hours.    Due to Covid, you will need to wear a mask upon entering the hospital. If you do not have a mask, a mask will be given to you at the Main Entrance upon arrival. For doctor visits, patients may have 1 support person age 67 or older with them. For treatment visits, patients can not have anyone with them due to social distancing guidelines and our immunocompromised population.

## 2021-09-26 NOTE — Progress Notes (Signed)
[place], [place] [address]   CLINIC:  Medical Oncology/Hematology  PCP:  Celene Squibb, MD [address] 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    CANCER STAGING:  Cancer Staging  No matching staging information was found for the patient.  INTERVAL HISTORY:  [name], a 66 y.o. male, returns for routine follow-up of his peritoneal carcinomatosis and liver lesions. Marvin Carr was last seen on 09/01/21.   Today he reports feeling well. He hasn't had a BM in 1 week. His appetite has decreased, but he continues to eat 3 meals daily. His abdominal pain has worsened, and now occasionally he will have stabbing abdominal pain as well. He takes tramadol and tylenol every other day for the pain which makes the pain tolerable. He reports improved abdominal pain and cough following thoracentesis.   REVIEW OF SYSTEMS:  Review of Systems  Constitutional:  Positive for appetite change and fatigue.  Respiratory:  Positive for cough (dry) and shortness of breath.   Gastrointestinal:  Positive for abdominal pain (7/10) and constipation.  Psychiatric/Behavioral:  Positive for depression and sleep disturbance.   All other systems reviewed and are negative.  PAST MEDICAL/SURGICAL HISTORY:  Past Medical History:  Diagnosis Date   Anxiety    Bladder cancer ([place]) 09/11/2009   CAD (coronary artery disease), native coronary artery    a. Mildly elevated troponin 03/2013, cath with nonobstructive disease including 50% AV groove distal stenosis before large OM   Essential hypertension    Headache(784.0)    History of migraines   Hyperglycemia    Mixed hyperlipidemia    Neuropathy    Obesity    Pre-diabetes    Seasonal  allergies    Sleep apnea    On CPAP   Past Surgical History:  Procedure Laterality Date   COLONOSCOPY  06/28/2011   Procedure: COLONOSCOPY;  Surgeon: Rogene Houston, MD;  Location: [facility];  Service: Endoscopy;  Laterality: N/A;   JOINT REPLACEMENT Right    hip   LEFT HEART CATHETERIZATION WITH CORONARY ANGIOGRAM N/A 03/31/2013   Procedure: LEFT HEART CATHETERIZATION WITH CORONARY ANGIOGRAM;  Surgeon: Minus Breeding, MD;  Location: [facility] CATH LAB;  Service: Cardiovascular;  Laterality: N/A;   TURBT  09/11/2009    SOCIAL HISTORY:  Social History   Socioeconomic History   Marital status: Divorced    Spouse name: Not on file   Number of children: 0   Years of education: college   Highest education level: Not on file  Occupational History    Employer: SELF-EMPLOYED  Tobacco Use   Smoking status: Former    Packs/day: 0.50    Years: 10.00    Pack years: 5.00    Types: Cigarettes    Quit date: 07/10/2011    Years since quitting: 10.2   Smokeless tobacco: Never   Tobacco comments:    Quit several yrs prior to 03/2013.  Vaping Use   Vaping Use: Never used  Substance and Sexual Activity   Alcohol use: Yes    Alcohol/week: 0.0 standard drinks    Comment: Occasional   Drug use: No   Sexual activity: Not on file  Other Topics Concern   Not on file  Social History Narrative  Not on file   Social Determinants of Health   Financial Resource Strain: Not on file  Food Insecurity: Not on file  Transportation Needs: Not on file  Physical Activity: Not on file  Stress: Not on file  Social Connections: Not on file  Intimate Partner Violence: Not on file    FAMILY HISTORY:  Family History  Problem Relation Age of Onset   COPD Father     CURRENT MEDICATIONS:  Current Outpatient Medications  Medication Sig Dispense Refill   aspirin EC 81 MG tablet Take 81 mg by mouth daily.     buPROPion (WELLBUTRIN XL) 300 MG 24 hr tablet Take 300 mg by mouth daily.      cyclobenzaprine (FLEXERIL) 10 MG tablet Take 10 mg by mouth at bedtime as needed for muscle spasms.     diclofenac (VOLTAREN) 75 MG EC tablet Take 75 mg by mouth 2 (two) times daily as needed for moderate pain.     eszopiclone (LUNESTA) 2 MG TABS tablet Take 2 mg by mouth at bedtime. Take immediately before bedtime     fenofibrate 160 MG tablet Take 160 mg by mouth daily.     FLUoxetine (PROZAC) 10 MG tablet Take 10 mg by mouth daily.     gabapentin (NEURONTIN) 300 MG capsule Take 300 mg by mouth 3 (three) times daily.     metFORMIN (GLUCOPHAGE-XR) 500 MG 24 hr tablet Take 500 mg by mouth 2 (two) times daily.     nitroGLYCERIN (NITROSTAT) 0.4 MG SL tablet Place 1 tablet (0.4 mg total) under the tongue every 5 (five) minutes as needed for chest pain. 25 tablet 3   olmesartan (BENICAR) 20 MG tablet Take 20 mg by mouth daily.     omeprazole (PRILOSEC) 40 MG capsule Take 40 mg by mouth daily.     pramipexole (MIRAPEX) 0.5 MG tablet Take 0.5 mg by mouth 3 (three) times daily.      RYBELSUS 7 MG TABS Take 7 mg by mouth daily.     tamsulosin (FLOMAX) 0.4 MG CAPS capsule Take 0.4 mg by mouth daily.     traMADol (ULTRAM) 50 MG tablet Take 50 mg by mouth daily as needed for moderate pain.      No current facility-administered medications for this visit.    ALLERGIES:  No Known Allergies  PHYSICAL EXAM:  Performance status (ECOG): 0 - Asymptomatic  Vitals:   09/26/21 1048  BP: 137/87  Pulse: 92  Resp: 20  Temp: 98 F (36.7 C)  SpO2: 94%   Wt Readings from Last 3 Encounters:  09/26/21 266 lb (120.7 kg)  09/23/21 260 lb (117.9 kg)  09/19/21 262 lb 14.4 oz (119.3 kg)   Physical Exam Vitals reviewed.  Constitutional:      Appearance: Normal appearance. He is obese.  Cardiovascular:     Rate and Rhythm: Normal rate and regular rhythm.     Pulses: Normal pulses.     Heart sounds: Normal heart sounds.  Pulmonary:     Effort: Pulmonary effort is normal.     Breath sounds: Normal breath  sounds.  Neurological:     General: No focal deficit present.     Mental Status: He is alert and oriented to person, place, and time.  Psychiatric:        Mood and Affect: Mood normal.        Behavior: Behavior normal.     LABORATORY DATA:  I have reviewed the labs as listed.  CBC Latest Ref  Rng & Units 09/15/2021 09/01/2021 06/11/2014  WBC 4.0 - 10.5 K/uL 8.9 7.8 6.0  Hemoglobin 13.0 - 17.0 g/dL 15.5 15.7 14.7  Hematocrit 39.0 - 52.0 % 49.2 50.0 43.3  Platelets 150 - 400 K/uL 516(H) 452(H) 195   CMP Latest Ref Rng & Units 09/01/2021 07/22/2014 06/11/2014  Glucose 70 - 99 mg/dL 110(H) - 129(H)  BUN 8 - 23 mg/dL 19 - 23  Creatinine 0.61 - 1.24 mg/dL 0.92 - 0.74  Sodium 135 - 145 mmol/L 138 - 142  Potassium 3.5 - 5.1 mmol/L 4.2 - 4.2  Chloride 98 - 111 mmol/L 106 - 106  CO2 22 - 32 mmol/L 23 - 27  Calcium 8.9 - 10.3 mg/dL 9.8 - 8.9  Total Protein 6.5 - 8.1 g/dL 7.3 6.1 -  Total Bilirubin 0.3 - 1.2 mg/dL 0.3 0.9 -  Alkaline Phos 38 - 126 U/L 58 58 -  AST 15 - 41 U/L 31 21 -  ALT 0 - 44 U/L 28 27 -    DIAGNOSTIC IMAGING:  I have independently reviewed the scans and discussed with the patient. CT ABDOMEN PELVIS WO CONTRAST  Result Date: 08/29/2021 CLINICAL DATA:  Abdominal pain EXAM: CT ABDOMEN AND PELVIS WITHOUT CONTRAST TECHNIQUE: Multidetector CT imaging of the abdomen and pelvis was performed following the standard protocol without IV contrast. COMPARISON:  07/13/2010 FINDINGS: Lower chest: Lung bases are clear. No effusions. Heart is normal size. Hepatobiliary: Ill-defined low-density lesion within the medial segment of the left hepatic lobe measures up to 6.1 cm. Ill-defined low-density lesion in the right hepatic lobe adjacent to the gallbladder fossa measures 3.3 cm. These are new since prior study. Gallstone within the fundus, stable. No biliary ductal dilatation. Pancreas: No focal abnormality or ductal dilatation. Spleen: No focal abnormality.  Normal size. Adrenals/Urinary  Tract: No adrenal abnormality. No focal renal abnormality. No stones or hydronephrosis. Urinary bladder is decompressed, grossly unremarkable. Stomach/Bowel: Left colonic diverticulosis. No active diverticulitis. No bowel obstruction. Stomach and small bowel decompressed, grossly unremarkable. Vascular/Lymphatic: No evidence of aneurysm or adenopathy. Reproductive: Prostate enlargement Other: Moderate ascites in the abdomen and pelvis. There is nodularity within the omentum anteriorly concerning for peritoneal tumor. Musculoskeletal: Right hip replacement. Small sclerotic focus in the left iliac bone is stable since 2011 compatible with bone island. No suspicious focal bony abnormality or acute bony abnormality. IMPRESSION: Ill-defined low-density lesions within the liver measuring up to 6.1 cm. This is concerning for possible metastases. Recommend further evaluation with contrast-enhanced CT or MRI. Nodularity throughout the peritoneum/omentum anteriorly in the abdomen and pelvis concerning for peritoneal tumor. Recommend clinical correlation for cancer history. Moderate free fluid in the abdomen and pelvis. Left colonic diverticulosis. Aortic atherosclerosis. Cholelithiasis. These results will be called to the ordering clinician or representative by the Radiologist Assistant, and communication documented in the PACS or [employer]. Electronically Signed   By: Rolm Baptise M.D.   On: 08/29/2021 08:15   MR Brain W Wo Contrast  Result Date: 09/21/2021 CLINICAL DATA:  Falls asleep unexpectedly, evaluate for metastatic disease EXAM: MRI HEAD WITHOUT AND WITH CONTRAST TECHNIQUE: Multiplanar, multiecho pulse sequences of the brain and surrounding structures were obtained without and with intravenous contrast. CONTRAST:  36mL GADAVIST GADOBUTROL 1 MMOL/ML IV SOLN COMPARISON:  CT head 02/24/2013 FINDINGS: Brain: There is no evidence of acute intracranial hemorrhage, extra-axial fluid collection, or acute infarct.  Parenchymal volume is normal. The ventricles are normal in size. There are a few scattered small foci of nonenhancing FLAIR signal abnormality  in the subcortical and periventricular white matter and pons, nonspecific but likely reflecting sequela of mild chronic white matter microangiopathy. There is no suspicious parenchymal signal abnormality. There is no abnormal enhancement. There is no mass lesion. There is no midline shift. Vascular: Normal flow voids. Skull and upper cervical spine: Normal marrow signal. Sinuses/Orbits: There are small retention cysts in the maxillary sinuses. The globes and orbits are unremarkable. Other: None. IMPRESSION: No acute intracranial pathology or evidence of intracranial metastatic disease. Electronically Signed   By: Valetta Mole M.D.   On: 09/21/2021 17:05   NM PET Image Initial (PI) Skull Base To Thigh (F-18 FDG)  Result Date: 09/09/2021 CLINICAL DATA:  Initial treatment strategy for history of bladder cancer. EXAM: NUCLEAR MEDICINE PET SKULL BASE TO THIGH TECHNIQUE: 13.392 mCi F-18 FDG was injected intravenously. Full-ring PET imaging was performed from the skull base to thigh after the radiotracer. CT data was obtained and used for attenuation correction and anatomic localization. Fasting blood glucose: 89 mg/dl COMPARISON:  CT abdomen and pelvis of August 29, 2021. FINDINGS: Mediastinal blood pool activity: SUV max 2.29 Liver activity: SUV max NA NECK: Mild asymmetry with respect to uptake at the LEFT base of tongue (image 29/3) maximum SUV 4.9 mild asymmetric fullness in this location of soft tissue. No hypermetabolic lymph nodes in the neck. Incidental CT findings: none CHEST: No hypermetabolic mediastinal or hilar nodes. No suspicious pulmonary nodules on the CT scan. Incidental CT findings: Aortic atherosclerosis and signs of coronary artery disease. Basilar atelectasis without effusion or consolidation. No suspicious pulmonary nodule. Top normal heart size.  ABDOMEN/PELVIS: FDG avid disease in the liver and peritoneum as well as upper abdominal lymph nodes. (Image 120/3) area measuring up to 6.5 x 5.4 cm in hepatic subsegment IV showing activity that extends beyond the liver margin to involve the gastric antrum adjacent with a maximum SUV of 19. Hepatic subsegment V (image 118/3) area measuring approximately 4.9 cm greatest axial dimension with a maximum SUV up to 16. Hepatic subsegment VIII low attenuation near the dome of the RIGHT hemi liver difficult to localize but with approximately 18 mm greatest axial dimension and a maximum SUV of 6.3. Is ascites along the liver margin. Nodularity along the liver margin. (Image 121/3) gastrohepatic lymph node measuring 16 mm short axis with a maximum SUV of 8.1. Diffuse peritoneal and omental nodularity. Thickening of mesenteric planes. FDG avid omental caking (image 156/3 is an example with infiltration of the omentum without measurable lesion, discrete lesion but with a maximum SUV of 8.0. Nodularity in small volume fluid extends along the LEFT and RIGHT pericolic gutter and into the pelvis. Small volume pelvic fluid with similar volume compared to recent imaging with some increased metabolic activity in the dependent aspect of pelvic fluid. Incidental CT findings: Cholelithiasis and sludge in the gallbladder. No acute upper abdominal process. Gallbladder is slightly more distended than on previous imaging. No signs of bowel obstruction. Colonic diverticulosis. SKELETON: No focal hypermetabolic activity to suggest skeletal metastasis. Incidental CT findings: None aside from RIGHT hip arthroplasty and spinal degenerative changes. 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 Gastric antral involvement from peritoneal disease is contiguous with hepatic metastatic disease. Nodal involvement in the upper abdomen. Mild fullness of LEFT base of tongue and mildly asymmetric activity in this  area, consider correlation with direct visualization to exclude mucosal lesion as warranted. Increasing distension of the gallbladder with cholelithiasis. Correlate with any RIGHT upper quadrant symptoms with worsening and consider  dedicated assessment with ultrasound as warranted. Stranding and nodularity throughout the peritoneum including about the gallbladder is related to peritoneal carcinomatosis. Electronically Signed   By: Zetta Bills M.D.   On: 09/09/2021 11:58   US BIOPSY (LIVER)  Result Date: 09/15/2021 INDICATION: 66 year old with a hepatic lesion, ascites and evidence of peritoneal disease. Tissue diagnosis is needed. EXAM: ULTRASOUND-GUIDED PARACENTESIS ULTRASOUND-GUIDED LIVER LESION BIOPSY MEDICATIONS: Moderate sedation ANESTHESIA/SEDATION: Moderate (conscious) sedation was employed during this procedure. A total of Versed 3.0mg  and fentanyl 150 mcg was administered intravenously at the order of the provider performing the procedure. Total intra-service moderate sedation time: 45 minutes. Patient's level of consciousness and vital signs were monitored continuously by radiology nurse throughout the procedure under the supervision of the provider performing the procedure. FLUOROSCOPY TIME:  None COMPLICATIONS: None immediate. PROCEDURE: Patient was complaining of abdominal distension. Ultrasound demonstrated perihepatic ascites. Patient was consented for ultrasound-guided paracentesis and the ultrasound-guided liver biopsy. Paracentesis was performed to treat the patient's abdominal distension, obtain cytology and decrease the risk of bleeding following the liver biopsy. Informed written consent was obtained from the patient after a thorough discussion of the procedural risks, benefits and alternatives. All questions were addressed. A timeout was performed prior to the initiation of the procedure. Ultrasound was used to identify the lesion in the left hepatic lobe and a small pocket of fluid in  the right lateral abdomen. The anterior abdomen and right lateral abdomen was prepped with chlorhexidine and sterile field was created. Skin along the right lateral abdomen was anesthetized with 1% lidocaine. Small incision was made. Using ultrasound guidance, a Safe-T-Centesis catheter was directed into the peritoneal fluid. Paracentesis was performed 1.5 L of amber colored fluid was removed. During the paracentesis, attention was directed to the left hepatic lesion. The skin along the anterior abdomen was anesthetized with 1% lidocaine and another small incision was made. Using ultrasound guidance, a 17 gauge coaxial needle was directed into the hypoechoic liver lesion. Three core biopsies were obtained with an 18 gauge core device. Specimens placed in formalin. Gel-Foam slurry was injected as the 17 gauge needle was removed. Paracentesis catheter was removed and bandages were placed. FINDINGS: Ascites was noted in the right lateral abdomen and perihepatic space. 1.5 L of fluid was removed. Fluid was sent for cytology. The medial segment of left hepatic lobe, segment 4, was very hypoechoic compared to the lateral segment. This corresponds with the tumor on the previous PET-CT. Biopsy needle was confirmed within the area of concern. Three adequate specimens were obtained. No immediate bleeding or hematoma formation. IMPRESSION: 1. Successful ultrasound-guided core biopsy of the left hepatic lesion. 2. Successful ultrasound-guided paracentesis. 1.5 L of fluid was removed and fluid was sent for cytology. Electronically Signed   By: Markus Daft M.D.   On: 09/15/2021 14:53   US Paracentesis  Result Date: 09/15/2021 INDICATION: 66 year old with a hepatic lesion, ascites and evidence of peritoneal disease. Tissue diagnosis is needed. EXAM: ULTRASOUND-GUIDED PARACENTESIS ULTRASOUND-GUIDED LIVER LESION BIOPSY MEDICATIONS: Moderate sedation ANESTHESIA/SEDATION: Moderate (conscious) sedation was employed during this  procedure. A total of Versed 3.0mg  and fentanyl 150 mcg was administered intravenously at the order of the provider performing the procedure. Total intra-service moderate sedation time: 45 minutes. Patient's level of consciousness and vital signs were monitored continuously by radiology nurse throughout the procedure under the supervision of the provider performing the procedure. FLUOROSCOPY TIME:  None COMPLICATIONS: None immediate. PROCEDURE: Patient was complaining of abdominal distension. Ultrasound demonstrated perihepatic ascites. Patient was consented for  ultrasound-guided paracentesis and the ultrasound-guided liver biopsy. Paracentesis was performed to treat the patient's abdominal distension, obtain cytology and decrease the risk of bleeding following the liver biopsy. Informed written consent was obtained from the patient after a thorough discussion of the procedural risks, benefits and alternatives. All questions were addressed. A timeout was performed prior to the initiation of the procedure. Ultrasound was used to identify the lesion in the left hepatic lobe and a small pocket of fluid in the right lateral abdomen. The anterior abdomen and right lateral abdomen was prepped with chlorhexidine and sterile field was created. Skin along the right lateral abdomen was anesthetized with 1% lidocaine. Small incision was made. Using ultrasound guidance, a Safe-T-Centesis catheter was directed into the peritoneal fluid. Paracentesis was performed 1.5 L of amber colored fluid was removed. During the paracentesis, attention was directed to the left hepatic lesion. The skin along the anterior abdomen was anesthetized with 1% lidocaine and another small incision was made. Using ultrasound guidance, a 17 gauge coaxial needle was directed into the hypoechoic liver lesion. Three core biopsies were obtained with an 18 gauge core device. Specimens placed in formalin. Gel-Foam slurry was injected as the 17 gauge needle  was removed. Paracentesis catheter was removed and bandages were placed. FINDINGS: Ascites was noted in the right lateral abdomen and perihepatic space. 1.5 L of fluid was removed. Fluid was sent for cytology. The medial segment of left hepatic lobe, segment 4, was very hypoechoic compared to the lateral segment. This corresponds with the tumor on the previous PET-CT. Biopsy needle was confirmed within the area of concern. Three adequate specimens were obtained. No immediate bleeding or hematoma formation. IMPRESSION: 1. Successful ultrasound-guided core biopsy of the left hepatic lesion. 2. Successful ultrasound-guided paracentesis. 1.5 L of fluid was removed and fluid was sent for cytology. Electronically Signed   By: Markus Daft M.D.   On: 09/15/2021 14:53     ASSESSMENT:  Liver lesion/peritoneal carcinomatosis: - Patient seen at the request of Dr. Delphina Cahill - Reported pressure on the sides of the abdomen with slight pain for the last 1 month.  He also reported pain in the epigastric region. - He had lost 20 pounds in the last 3 to 4 months intentionally, cutting back on sugars.  He was also started on semaglutide. - Colonoscopy on 06/28/2011 with a benign polypoid colonic mucosa in the descending colon.  No evidence of malignancy.   Social/family history: - He lives at home by himself.  He records voiceover/narrations. - Non-smoker. - Father died of MDS.  Paternal grandfather had prostate cancer.  Maternal grandfather had cancer.  3.  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 Alinda Money.  Reportedly received 1 treatment of intravesical chemo and has been on surveillance since then.  Last surveillance visit was in 2020.   PLAN:  Cholangiocarcinoma with peritoneal carcinomatosis: - We have reviewed images of the PET scan with the patient. - We have also reviewed MRI of the brain which was negative. - EGD and colonoscopy on 09/23/2021 did not reveal any  malignancies. - We have reviewed biopsy results of the left lobe of the liver showing poorly differentiated adenocarcinoma with necrosis.  IHC positive for CK7 and CDX2 and negative for GATA3.  Findings suggestive of an upper GI or pancreaticobiliary primary. - He does not have any upper GI lesions on the endoscopy.  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 - Recommend NGS testing. - We have discussed treatment with  palliative intent with symptom control and improve quality of life. - We discussed first-line chemotherapy with gemcitabine, cisplatin with durvalumab given every 21 days. - We have discussed side effects and given literature. - We will have port placed.  RTC after port placement to initiate first cycle.    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  3.  Abdominal pain: - He reports slight worsening of abdominal pain since last visit. - Tramadol is helping. - Continue tramadol 50 mg daily as needed.  4.  Constipation: - He did not have a bowel movement for the last 1 week. - He is taking Colace. - We will send lactulose 30 mL to be taken every 3 hours until BM followed by maintenance lactulose of 30 mL 1-2 times daily.   Orders placed this encounter:  No orders of the defined types were placed in this encounter.    Derek Jack, MD [place] [phone]   I, Thana Ates, am acting as a scribe for Dr. Derek Jack.  I, Derek Jack MD, have reviewed the above documentation for accuracy and completeness, and I agree with the above.

## 2021-09-26 NOTE — Progress Notes (Signed)
Patient given written information on Gemzar, Cisplatin and Imfinzi as discussed by Dr. Delton Coombes. Chemotherapy education scheduled prior to first treatment. All questions addressed and answered to patient's satisfaction.

## 2021-09-27 LAB — SURGICAL PATHOLOGY

## 2021-09-28 ENCOUNTER — Ambulatory Visit (HOSPITAL_COMMUNITY)
Admission: RE | Admit: 2021-09-28 | Discharge: 2021-09-28 | Disposition: A | Discharge status: Home / Self Care | Payer: Medicare Other | Source: Ambulatory Visit | Attending: Hematology | Admitting: Hematology

## 2021-09-28 ENCOUNTER — Other Ambulatory Visit: Payer: Self-pay

## 2021-09-28 ENCOUNTER — Other Ambulatory Visit: Payer: Self-pay | Admitting: Radiology

## 2021-09-28 DIAGNOSIS — F419 Anxiety disorder, unspecified: Secondary | ICD-10-CM | POA: Diagnosis not present

## 2021-09-28 DIAGNOSIS — R188 Other ascites: Secondary | ICD-10-CM | POA: Insufficient documentation

## 2021-09-28 DIAGNOSIS — I10 Essential (primary) hypertension: Secondary | ICD-10-CM | POA: Insufficient documentation

## 2021-09-28 DIAGNOSIS — C787 Secondary malignant neoplasm of liver and intrahepatic bile duct: Secondary | ICD-10-CM

## 2021-09-28 DIAGNOSIS — C679 Malignant neoplasm of bladder, unspecified: Secondary | ICD-10-CM | POA: Diagnosis present

## 2021-09-28 DIAGNOSIS — C221 Intrahepatic bile duct carcinoma: Secondary | ICD-10-CM | POA: Diagnosis not present

## 2021-09-28 DIAGNOSIS — I2510 Atherosclerotic heart disease of native coronary artery without angina pectoris: Secondary | ICD-10-CM | POA: Diagnosis not present

## 2021-09-28 DIAGNOSIS — C786 Secondary malignant neoplasm of retroperitoneum and peritoneum: Secondary | ICD-10-CM | POA: Insufficient documentation

## 2021-09-28 HISTORY — PX: IR IMAGING GUIDED PORT INSERTION: IMG5740

## 2021-09-28 HISTORY — PX: IR PARACENTESIS: IMG2679

## 2021-09-28 MED ORDER — HEPARIN SOD (PORK) LOCK FLUSH 100 UNIT/ML IV SOLN
INTRAVENOUS | Status: AC
  Administered 2021-09-28: 500 [IU]
  Filled 2021-09-28: qty 5

## 2021-09-28 MED ORDER — SODIUM CHLORIDE 0.9 % IV SOLN: INTRAVENOUS | Status: DC

## 2021-09-28 MED ORDER — LIDOCAINE-EPINEPHRINE 1 %-1:100000 IJ SOLN
INTRAMUSCULAR | Status: AC
  Administered 2021-09-28: 20 mL
  Filled 2021-09-28: qty 1

## 2021-09-28 MED ORDER — FENTANYL CITRATE (PF) 100 MCG/2ML IJ SOLN
INTRAMUSCULAR | Status: AC
  Filled 2021-09-28: qty 2

## 2021-09-28 MED ORDER — MIDAZOLAM HCL 2 MG/2ML IJ SOLN
INTRAMUSCULAR | Status: AC
  Filled 2021-09-28: qty 4

## 2021-09-28 MED ORDER — MIDAZOLAM HCL 2 MG/2ML IJ SOLN
INTRAMUSCULAR | Status: AC | PRN
Start: 2021-09-28 — End: 2021-09-28
  Administered 2021-09-28 (×2): 1 mg via INTRAVENOUS

## 2021-09-28 MED ORDER — LIDOCAINE HCL 1 % IJ SOLN
INTRAMUSCULAR | Status: AC
  Administered 2021-09-28: 10 mL
  Filled 2021-09-28: qty 20

## 2021-09-28 MED ORDER — FENTANYL CITRATE (PF) 100 MCG/2ML IJ SOLN
INTRAMUSCULAR | Status: AC | PRN
  Administered 2021-09-28 (×2): 50 ug via INTRAVENOUS

## 2021-09-28 NOTE — Procedures (Addendum)
Vascular and Interventional Radiology Procedure Note  Patient: Marvin Carr DOB: 1955-12-14 Medical Record Number: [id number] Note Date/Time: 09/28/21 1:04 PM   Performing Physician: Michaelle Birks, MD Assistant(s): None  Diagnosis:  Bladder CA  Procedure:  PORT PLACEMENT PARACENTESIS, Therapeutic  Anesthesia: Conscious Sedation Complications: None Estimated Blood Loss: Minimal  Findings:  Successful right-sided port placement, with the tip of the catheter in the proximal right atrium. The LeftLower quadrant peritoneal space was accessed with a Yueh Needle, and 4800 mL of fluid was obtained.  Intravenous Albumin: Was not administered.  Plan: Catheter ready for use.  See detailed procedure note with images in PACS. The patient tolerated the procedure well without incident or complication and was returned to Recovery in stable condition.    Michaelle Birks, MD [facility] and Interventional [facility]   Pager. [place]

## 2021-09-28 NOTE — Sedation Documentation (Signed)
4.8 Liters removed from paracentesis.

## 2021-09-28 NOTE — H&P (Signed)
Chief Complaint: Patient was seen in consultation today for port-a-catheter placement.   Referring Physician(s): Katragadda,Sreedhar  Supervising Physician: Michaelle Birks  Patient Status: [facility] - Out-pt  History of Present Illness: Marvin Carr is a 66 y.o. male with a medical history significant for anxiety, CAD, HTN and bladder cancer. He presented to his PCP with abdominal pain/pressure of approximately one month's duration. He had also lost 20 lbs in several months but this was intentional. 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 Pathology was positive for adenocarcinoma and his oncology team is preparing him for palliative chemotherapy.   Interventional Radiology has been asked to evaluate this patient for an image-guided port-a-catheter placement to facilitate his treatment plans.   Past Medical History:  Diagnosis Date   Anxiety    Bladder cancer ([place]) 09/11/2009   CAD (coronary artery disease), native coronary artery    a. Mildly elevated troponin 03/2013, cath with nonobstructive disease including 50% AV groove distal stenosis before large OM   Essential hypertension    Headache(784.0)    History of migraines   Hyperglycemia    Mixed hyperlipidemia    Neuropathy    Obesity    Pre-diabetes    Seasonal allergies    Sleep apnea    On CPAP    Past Surgical History:  Procedure Laterality Date   BIOPSY  09/23/2021   Procedure: BIOPSY;  Surgeon: Harvel Quale, MD;  [facility]: [facility];  Service: Gastroenterology;;   COLONOSCOPY  06/28/2011   Procedure: COLONOSCOPY;  Surgeon: Rogene Houston, MD;  Location: [facility];  Service: Endoscopy;  Laterality: N/A;   COLONOSCOPY WITH PROPOFOL N/A 09/23/2021   Procedure: COLONOSCOPY WITH PROPOFOL;  Surgeon: Harvel Quale, MD;  Location: [facility];  Service: Gastroenterology;   Laterality: N/A;  940   ESOPHAGOGASTRODUODENOSCOPY (EGD) WITH PROPOFOL N/A 09/23/2021   Procedure: ESOPHAGOGASTRODUODENOSCOPY (EGD) WITH PROPOFOL;  Surgeon: Harvel Quale, MD;  Location: [facility];  Service: Gastroenterology;  Laterality: N/A;   JOINT REPLACEMENT Right    hip   LEFT HEART CATHETERIZATION WITH CORONARY ANGIOGRAM N/A 03/31/2013   Procedure: LEFT HEART CATHETERIZATION WITH CORONARY ANGIOGRAM;  Surgeon: Minus Breeding, MD;  Location: [facility] [place] C[facility] LAB;  Service: Cardiovascular;  Laterality: N/A;   POLYPECTOMY  09/23/2021   Procedure: POLYPECTOMY;  Surgeon: Harvel Quale, MD;  Location: [facility];  Service: Gastroenterology;;   TURBT  09/11/2009    Allergies: Patient has no known allergies.  Medications: Prior to Admission medications   Medication Sig Start Date End Date Taking? Authorizing Provider  aspirin EC 81 MG tablet Take 81 mg by mouth daily.   Yes [provider]  buPROPion (WELLBUTRIN XL) 300 MG 24 hr tablet Take 300 mg by mouth daily. 03/18/09  Yes [provider]  diclofenac (VOLTAREN) 75 MG EC tablet Take 75 mg by mouth 2 (two) times daily as needed for moderate pain. 08/29/21  Yes [provider]  eszopiclone (LUNESTA) 2 MG TABS tablet Take 2 mg by mouth at bedtime. Take immediately before bedtime   Yes [provider]  fenofibrate 160 MG tablet Take 160 mg by mouth daily. 11/05/19  Yes [provider]  FLUoxetine (PROZAC) 10 MG tablet Take 10 mg by mouth daily.   Yes [provider]  gabapentin (NEURONTIN) 300 MG capsule Take 300 mg by mouth 3 (three) times daily. 08/08/21  Yes [provider]  metFORMIN (GLUCOPHAGE-XR) 500 MG 24 hr tablet Take 500 mg by mouth 2 (two) times daily. 08/16/21  Yes [provider]  olmesartan (BENICAR) 20 MG tablet Take 20 mg by mouth daily. 08/16/21  Yes [provider]  omeprazole (PRILOSEC) 40 MG capsule Take 40 mg by mouth daily.  10/20/19  Yes [provider]  pramipexole (MIRAPEX) 0.5 MG tablet Take 0.5 mg by mouth 3 (three) times daily.    Yes [provider]  RYBELSUS 7 MG TABS Take 7 mg by mouth daily. 08/16/21  Yes [provider]  tamsulosin (FLOMAX) 0.4 MG CAPS capsule Take 0.4 mg by mouth daily. 08/29/21  Yes [provider]  traMADol (ULTRAM) 50 MG tablet Take 50 mg by mouth daily as needed for moderate pain.  03/18/09  Yes [provider]  cyclobenzaprine (FLEXERIL) 10 MG tablet Take 10 mg by mouth at bedtime as needed for muscle spasms.    [provider]  Lactulose 20 GM/30ML SOLN Take 30 mLs (20 g total) by mouth daily. 09/26/21   Derek Jack, MD  nitroGLYCERIN (NITROSTAT) 0.4 MG SL tablet Place 1 tablet (0.4 mg total) under the tongue every 5 (five) minutes as needed for chest pain. 09/26/18   Satira Sark, MD     Family History  Problem Relation Age of Onset   COPD Father     Social History   Socioeconomic History   Marital status: Divorced    Spouse name: Not on file   Number of children: 0   Years of education: college   Highest education level: Not on file  Occupational History    Employer: SELF-EMPLOYED  Tobacco Use   Smoking status: Former    Packs/day: 0.50    Years: 10.00    Pack years: 5.00    Types: Cigarettes    Quit date: 07/10/2011    Years since quitting: 10.2   Smokeless tobacco: Never   Tobacco comments:    Quit several yrs prior to 03/2013.  Vaping Use   Vaping Use: Never used  Substance and Sexual Activity   Alcohol use: Yes    Alcohol/week: 0.0 standard drinks    Comment: Occasional   Drug use: No   Sexual activity: Not on file  Other Topics Concern   Not on file  Social History Narrative   Not on file   Social Determinants of Health   Financial Resource Strain: Not on file  Food Insecurity: Not on file  Transportation Needs: Not on file  Physical Activity: Not on file  Stress: Not on file   Social Connections: Not on file    Review of Systems: A 12 point ROS discussed and pertinent positives are indicated in the HPI above.  All other systems are negative.  Review of Systems  Constitutional:  Positive for fatigue. Negative for appetite change.  Respiratory:  Positive for shortness of breath. Negative for cough.   Cardiovascular:  Negative for chest pain and leg swelling.  Gastrointestinal:  Positive for abdominal distention, abdominal pain and nausea.  Neurological:  Negative for dizziness and headaches.   Vital Signs: BP (!) 127/91    Pulse 87    Temp 98.3 F (36.8 C) (Oral)    Resp 18    Ht 6' (1.829 m)    Wt 260 lb (117.9 kg)    SpO2 96%    BMI 35.26 kg/m   Physical Exam Constitutional:      General: He is not in acute  distress.    Appearance: He is ill-appearing.  HENT:     Mouth/Throat:     Mouth: Mucous membranes are moist.     Pharynx: Oropharynx is clear.  Cardiovascular:     Rate and Rhythm: Normal rate and regular rhythm.  Pulmonary:     Effort: Pulmonary effort is normal.     Breath sounds: Normal breath sounds.  Abdominal:     General: Bowel sounds are normal. There is distension.     Palpations: Abdomen is soft.  Skin:    General: Skin is warm and dry.  Neurological:     Mental Status: He is alert and oriented to person, place, and time.    Imaging: MR Brain W Wo Contrast  Result Date: 09/21/2021 CLINICAL DATA:  Falls asleep unexpectedly, evaluate for metastatic disease EXAM: MRI HEAD WITHOUT AND WITH CONTRAST TECHNIQUE: Multiplanar, multiecho pulse sequences of the brain and surrounding structures were obtained without and with intravenous contrast. CONTRAST:  81mL GADAVIST GADOBUTROL 1 MMOL/ML IV SOLN COMPARISON:  CT head 02/24/2013 FINDINGS: Brain: There is no evidence of acute intracranial hemorrhage, extra-axial fluid collection, or acute infarct. Parenchymal volume is normal. The ventricles are normal in size. There are a few scattered small  foci of nonenhancing FLAIR signal abnormality in the subcortical and periventricular white matter and pons, nonspecific but likely reflecting sequela of mild chronic white matter microangiopathy. There is no suspicious parenchymal signal abnormality. There is no abnormal enhancement. There is no mass lesion. There is no midline shift. Vascular: Normal flow voids. Skull and upper cervical spine: Normal marrow signal. Sinuses/Orbits: There are small retention cysts in the maxillary sinuses. The globes and orbits are unremarkable. Other: None. IMPRESSION: No acute intracranial pathology or evidence of intracranial metastatic disease. Electronically Signed   By: Valetta Mole M.D.   On: 09/21/2021 17:05   NM PET Image Initial (PI) Skull Base To Thigh (F-18 FDG)  Result Date: 09/09/2021 CLINICAL DATA:  Initial treatment strategy for history of bladder cancer. EXAM: NUCLEAR MEDICINE PET SKULL BASE TO THIGH TECHNIQUE: 13.392 mCi F-18 FDG was injected intravenously. Full-ring PET imaging was performed from the skull base to thigh after the radiotracer. CT data was obtained and used for attenuation correction and anatomic localization. Fasting blood glucose: 89 mg/dl COMPARISON:  CT abdomen and pelvis of August 29, 2021. FINDINGS: Mediastinal blood pool activity: SUV max 2.29 Liver activity: SUV max NA NECK: Mild asymmetry with respect to uptake at the LEFT base of tongue (image 29/3) maximum SUV 4.9 mild asymmetric fullness in this location of soft tissue. No hypermetabolic lymph nodes in the neck. Incidental CT findings: none CHEST: No hypermetabolic mediastinal or hilar nodes. No suspicious pulmonary nodules on the CT scan. Incidental CT findings: Aortic atherosclerosis and signs of coronary artery disease. Basilar atelectasis without effusion or consolidation. No suspicious pulmonary nodule. Top normal heart size. ABDOMEN/PELVIS: FDG avid disease in the liver and peritoneum as well as upper abdominal lymph nodes.  (Image 120/3) area measuring up to 6.5 x 5.4 cm in hepatic subsegment IV showing activity that extends beyond the liver margin to involve the gastric antrum adjacent with a maximum SUV of 19. Hepatic subsegment V (image 118/3) area measuring approximately 4.9 cm greatest axial dimension with a maximum SUV up to 16. Hepatic subsegment VIII low attenuation near the dome of the RIGHT hemi liver difficult to localize but with approximately 18 mm greatest axial dimension and a maximum SUV of 6.3. Is ascites along the liver margin. Nodularity along  the liver margin. (Image 121/3) gastrohepatic lymph node measuring 16 mm short axis with a maximum SUV of 8.1. Diffuse peritoneal and omental nodularity. Thickening of mesenteric planes. FDG avid omental caking (image 156/3 is an example with infiltration of the omentum without measurable lesion, discrete lesion but with a maximum SUV of 8.0. Nodularity in small volume fluid extends along the LEFT and RIGHT pericolic gutter and into the pelvis. Small volume pelvic fluid with similar volume compared to recent imaging with some increased metabolic activity in the dependent aspect of pelvic fluid. Incidental CT findings: Cholelithiasis and sludge in the gallbladder. No acute upper abdominal process. Gallbladder is slightly more distended than on previous imaging. No signs of bowel obstruction. Colonic diverticulosis. SKELETON: No focal hypermetabolic activity to suggest skeletal metastasis. Incidental CT findings: None aside from RIGHT hip arthroplasty and spinal degenerative changes. 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 Gastric antral involvement from peritoneal disease is contiguous with hepatic metastatic disease. Nodal involvement in the upper abdomen. Mild fullness of LEFT base of tongue and mildly asymmetric activity in this area, consider correlation with direct visualization to exclude mucosal lesion as warranted.  Increasing distension of the gallbladder with cholelithiasis. Correlate with any RIGHT upper quadrant symptoms with worsening and consider dedicated assessment with ultrasound as warranted. Stranding and nodularity throughout the peritoneum including about the gallbladder is related to peritoneal carcinomatosis. Electronically Signed   By: Zetta Bills M.D.   On: 09/09/2021 11:58   US BIOPSY (LIVER)  Result Date: 09/15/2021 INDICATION: 66 year old with a hepatic lesion, ascites and evidence of peritoneal disease. Tissue diagnosis is needed. EXAM: ULTRASOUND-GUIDED PARACENTESIS ULTRASOUND-GUIDED LIVER LESION BIOPSY MEDICATIONS: Moderate sedation ANESTHESIA/SEDATION: Moderate (conscious) sedation was employed during this procedure. A total of Versed 3.0mg  and fentanyl 150 mcg was administered intravenously at the order of the provider performing the procedure. Total intra-service moderate sedation time: 45 minutes. Patient's level of consciousness and vital signs were monitored continuously by radiology nurse throughout the procedure under the supervision of the provider performing the procedure. FLUOROSCOPY TIME:  None COMPLICATIONS: None immediate. PROCEDURE: Patient was complaining of abdominal distension. Ultrasound demonstrated perihepatic ascites. Patient was consented for ultrasound-guided paracentesis and the ultrasound-guided liver biopsy. Paracentesis was performed to treat the patient's abdominal distension, obtain cytology and decrease the risk of bleeding following the liver biopsy. Informed written consent was obtained from the patient after a thorough discussion of the procedural risks, benefits and alternatives. All questions were addressed. A timeout was performed prior to the initiation of the procedure. Ultrasound was used to identify the lesion in the left hepatic lobe and a small pocket of fluid in the right lateral abdomen. The anterior abdomen and right lateral abdomen was prepped with  chlorhexidine and sterile field was created. Skin along the right lateral abdomen was anesthetized with 1% lidocaine. Small incision was made. Using ultrasound guidance, a Safe-T-Centesis catheter was directed into the peritoneal fluid. Paracentesis was performed 1.5 L of amber colored fluid was removed. During the paracentesis, attention was directed to the left hepatic lesion. The skin along the anterior abdomen was anesthetized with 1% lidocaine and another small incision was made. Using ultrasound guidance, a 17 gauge coaxial needle was directed into the hypoechoic liver lesion. Three core biopsies were obtained with an 18 gauge core device. Specimens placed in formalin. Gel-Foam slurry was injected as the 17 gauge needle was removed. Paracentesis catheter was removed and bandages were placed. FINDINGS: Ascites was noted in the right lateral abdomen and perihepatic  space. 1.5 L of fluid was removed. Fluid was sent for cytology. The medial segment of left hepatic lobe, segment 4, was very hypoechoic compared to the lateral segment. This corresponds with the tumor on the previous PET-CT. Biopsy needle was confirmed within the area of concern. Three adequate specimens were obtained. No immediate bleeding or hematoma formation. IMPRESSION: 1. Successful ultrasound-guided core biopsy of the left hepatic lesion. 2. Successful ultrasound-guided paracentesis. 1.5 L of fluid was removed and fluid was sent for cytology. Electronically Signed   By: Markus Daft M.D.   On: 09/15/2021 14:53   US Paracentesis  Result Date: 09/15/2021 INDICATION: 66 year old with a hepatic lesion, ascites and evidence of peritoneal disease. Tissue diagnosis is needed. EXAM: ULTRASOUND-GUIDED PARACENTESIS ULTRASOUND-GUIDED LIVER LESION BIOPSY MEDICATIONS: Moderate sedation ANESTHESIA/SEDATION: Moderate (conscious) sedation was employed during this procedure. A total of Versed 3.0mg  and fentanyl 150 mcg was administered intravenously at the  order of the provider performing the procedure. Total intra-service moderate sedation time: 45 minutes. Patient's level of consciousness and vital signs were monitored continuously by radiology nurse throughout the procedure under the supervision of the provider performing the procedure. FLUOROSCOPY TIME:  None COMPLICATIONS: None immediate. PROCEDURE: Patient was complaining of abdominal distension. Ultrasound demonstrated perihepatic ascites. Patient was consented for ultrasound-guided paracentesis and the ultrasound-guided liver biopsy. Paracentesis was performed to treat the patient's abdominal distension, obtain cytology and decrease the risk of bleeding following the liver biopsy. Informed written consent was obtained from the patient after a thorough discussion of the procedural risks, benefits and alternatives. All questions were addressed. A timeout was performed prior to the initiation of the procedure. Ultrasound was used to identify the lesion in the left hepatic lobe and a small pocket of fluid in the right lateral abdomen. The anterior abdomen and right lateral abdomen was prepped with chlorhexidine and sterile field was created. Skin along the right lateral abdomen was anesthetized with 1% lidocaine. Small incision was made. Using ultrasound guidance, a Safe-T-Centesis catheter was directed into the peritoneal fluid. Paracentesis was performed 1.5 L of amber colored fluid was removed. During the paracentesis, attention was directed to the left hepatic lesion. The skin along the anterior abdomen was anesthetized with 1% lidocaine and another small incision was made. Using ultrasound guidance, a 17 gauge coaxial needle was directed into the hypoechoic liver lesion. Three core biopsies were obtained with an 18 gauge core device. Specimens placed in formalin. Gel-Foam slurry was injected as the 17 gauge needle was removed. Paracentesis catheter was removed and bandages were placed. FINDINGS: Ascites was  noted in the right lateral abdomen and perihepatic space. 1.5 L of fluid was removed. Fluid was sent for cytology. The medial segment of left hepatic lobe, segment 4, was very hypoechoic compared to the lateral segment. This corresponds with the tumor on the previous PET-CT. Biopsy needle was confirmed within the area of concern. Three adequate specimens were obtained. No immediate bleeding or hematoma formation. IMPRESSION: 1. Successful ultrasound-guided core biopsy of the left hepatic lesion. 2. Successful ultrasound-guided paracentesis. 1.5 L of fluid was removed and fluid was sent for cytology. Electronically Signed   By: Markus Daft M.D.   On: 09/15/2021 14:53    Labs:  CBC: Recent Labs    09/01/21 0924 09/15/21 0943  WBC 7.8 8.9  HGB 15.7 15.5  HCT 50.0 49.2  PLT 452* 516*    COAGS: Recent Labs    09/15/21 0943  INR 0.9    BMP: Recent Labs    09/01/21  0924  NA 138  K 4.2  CL 106  CO2 23  GLUCOSE 110*  BUN 19  CALCIUM 9.8  CREATININE 0.92  GFRNONAA >60    LIVER FUNCTION TESTS: Recent Labs    09/01/21 0924  BILITOT 0.3  AST 31  ALT 28  ALKPHOS 58  PROT 7.3  ALBUMIN 4.0    TUMOR MARKERS: No results for input(s): AFPTM, CEA, CA199, CHROMGRNA in the last 8760 hours.  Assessment and Plan:  Cholangiocarcinoma with peritoneal carcinomatosis; palliative chemotherapy pending: Marvin Carr, 66 year old male, presents today to the [facility] department for an image-guided port-a-catheter placement. Verbal order for a paracentesis was obtained from Dr. Delton Coombes and the patient will have this procedure done prior to port placement.   Risks and benefits of image-guided Port-a-catheter placement were discussed with the patient including, but not limited to bleeding, infection, pneumothorax, or fibrin sheath development and need for additional procedures.  All of the patient's questions were answered, patient is agreeable to proceed. He has  been NPO.   Consent signed and in chart.  Thank you for this interesting consult.  I greatly enjoyed meeting Marvin Carr and look forward to participating in their care.  A copy of this report was sent to the requesting provider on this date.  Electronically Signed: Soyla Dryer, AGACNP-BC [phone] 09/28/2021, 11:22 AM   I spent a total of  30 Minutes   in face to face in clinical consultation, greater than 50% of which was counseling/coordinating care for port-a-catheter placement.

## 2021-09-29 ENCOUNTER — Encounter (HOSPITAL_COMMUNITY): Payer: Self-pay | Admitting: Hematology

## 2021-09-29 ENCOUNTER — Encounter (INDEPENDENT_AMBULATORY_CARE_PROVIDER_SITE_OTHER): Payer: Self-pay | Admitting: *Deleted

## 2021-09-30 ENCOUNTER — Encounter (HOSPITAL_COMMUNITY): Payer: Self-pay

## 2021-09-30 ENCOUNTER — Encounter: Payer: Self-pay | Admitting: *Deleted

## 2021-09-30 ENCOUNTER — Encounter (HOSPITAL_COMMUNITY): Payer: Self-pay | Admitting: Hematology

## 2021-09-30 DIAGNOSIS — Z95828 Presence of other vascular implants and grafts: Secondary | ICD-10-CM | POA: Insufficient documentation

## 2021-09-30 HISTORY — DX: Presence of other vascular implants and grafts: Z95.828

## 2021-09-30 MED ORDER — PROCHLORPERAZINE MALEATE 10 MG PO TABS: 10.0000 mg | ORAL_TABLET | Freq: Four times a day (QID) | ORAL | 1 refills | Status: DC | PRN

## 2021-09-30 MED ORDER — LIDOCAINE-PRILOCAINE 2.5-2.5 % EX CREA: TOPICAL_CREAM | CUTANEOUS | 3 refills | Status: DC

## 2021-09-30 NOTE — Progress Notes (Signed)
Approval received from [facility] plan for lidocaine-prilocaine 2.5-2.5% cream and may be filled for a 30 or 90 day supply.

## 2021-09-30 NOTE — Patient Instructions (Signed)
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  You will receive treatment in the clinic with a combination of chemotherapy drugs.  Those drugs are cisplatin and gemcitabine (Gemzar).  You will also receive an immunotherapy drug called durvalumab (Imfinzi).  You will receive this drug on the first day (Day 1) of each treatment cycle.  You will come to the clinic weekly x 2 weeks for your infusions, then you will have a week off before you return for your next cycle of treatment, at which time the process will repeat (2 weekly treatments with one week off prior to starting the next cycle of treatment).  The intent of treatment is to control your cancer, prevent it from spreading further, and to alleviate any symptoms you may be having related to your disease.  You will see the doctor regularly throughout treatment. We will obtain blood work from you prior to every treatment and monitor your results to make sure it is safe to give your treatment. The doctor monitors your response to treatment by the way you are feeling, your blood work, and by obtaining scans periodically.  There will be wait times while you are here for treatment.  It will take about 30 minutes to 1 hour for your lab work to result.  Then there will be wait times while pharmacy mixes your medications.    Medications you will receive in the clinic prior to your chemotherapy medications:  Aloxi:  ALOXI is used in adults to help prevent nausea and vomiting that happens with certain chemotherapy drugs.  Aloxi is a long acting medication, and will remain in your system for about two days.   Emend:  This is an anti-nausea medication that is used with Aloxi to help prevent nausea and vomiting caused by chemotherapy.  Dexamethasone:  This is a steroid given prior to chemotherapy to help prevent allergic reactions; it may also help prevent and control nausea and diarrhea.     CISPLATIN  About This Drug Cisplatin is a drug used to treat cancer. This drug is given in the vein (IV).  This will take 1 hour to infuse.  With this drug you will receive 2 hours of IV fluid hydration prior to administration, and 1 hour of IV hydration after administration.  This is to help protect your kidneys.  You will have to urinate 200 mL prior to receiving this medication.  We will give you something to measure your urine in.   Possible Side Effects (More Common)  This drug may affect how your kidneys work. Your kidney function will be checked as needed.   Electrolyte changes. Your blood will be checked for electrolyte changes as needed.   High-frequency hearing loss may occur. You will get IV fluids before and during the Cisplatin infusion to help prevent this. You may also get ringing in the ears.   Bone marrow depression. This is a decrease in the number of white blood cells, red blood cells, and platelets. This may raise your risk of infection, make you tired and weak (fatigue), and raise your risk of bleeding.   Nausea and throwing up (vomiting). These symptoms may happen within a few hours after your treatment and may last for a few days to a week. Medicines are available to stop or lessen these side effects.  Possible Side Effects (Less Common)  Effects on the nerves are called peripheral neuropathy. You may feel numbness or pain in your hands  and feet. It may be hard for you to button your clothes, open jars, or walk as usual. The effect on the nerves may get worse with more doses of the drug. These effects get better in some people after the drug is stopped, but it does not get better in all people.   Blurred vision or other changes in eyesight.   Soreness of the mouth and throat. You may have red areas, white patches, or sores that hurt.   Hair loss. You may notice your hair getting thin. Some patients lose their hair. Your hair often grows back when treatment is  done.  Allergic Reactions Allergic reactions to this drug are rare, but may happen in some patients. Signs of allergic reactions to this drug may be a rash, fever, chills, feeling dizzy, trouble breathing, and/or feeling that your heart is beating in a fast or not normal way.  Treating Side Effects  Drink 6-8 cups of fluids each day unless your doctor has told you to limit your fluid intake due to some other health problem. A cup is 8 ounces of fluid. If you throw up or have loose bowel movements you should drink more fluids so that you do not become dehydrated (lack water in the body due to losing too much fluid).   If you have numbness and tingling in your hands and feet, be careful when cooking, walking, and handling sharp objects and hot liquids.   Mouth care is very important. Your mouth care should consist of routine, gentle cleaning of your teeth or dentures and rinsing your mouth with a mixture of 1/2 teaspoon of salt in 8 ounces of water or  teaspoon of baking soda in 8 ounces of water. This should be done at least after each meal and at bedtime.   If you have mouth sores, avoid mouthwash that has alcohol. Also avoid alcohol and smoking because they can bother your mouth and throat.   Talk with your nurse about getting a wig before you lose your hair. Also, call the [employer] at 800-ACS-2345 to find out information about the Look Good, Feel Better program close to where you live. It is a free program where women getting chemotherapy can learn about wigs, turbans and scarves as well as makeup techniques and skin and nail care.  Food and Drug Interactions  There are no known interactions of Cisplatin with food. This drug may interact with other medicines. Tell your doctor and pharmacist about all the medicines and dietary supplements (vitamins, minerals, herbs and others) that you are taking at this time. The safety and use of dietary supplements and alternative diets are  often not known. Using these might affect your cancer or interfere with your treatment. Until more is known, you should not use dietary supplements or alternative diets without your cancer doctor's help.  When to Call the Doctor  Call your doctor or nurse right away if you have any of these symptoms:  Rash or itching   Feeling dizzy or lightheaded   Wheezing or trouble breathing   Swelling of the face   Fever of 100.4 F (38 C) or above   Chills   Easy bleeding or bruising   Decreased urine   Weight gain of 5 pounds in one week (fluid retention)   Nausea that stops you from eating or drinking   Throwing up more than 3 times a day  Call your doctor or nurse as soon as possible if you have these  symptoms:  Numbness, tingling, decreased feeling or weakness in fingers, toes, arms, or legs   Trouble walking or changes in the way you walk, feeling clumsy when buttoning clothes, opening jars, or other routine hand motions   Blurred vision or other changes in eyesight   Changes in hearing, ringing in the ears   Pain in your mouth or throat that makes it hard to eat or drink   Fatigue that interferes with your daily activities  Sexual Problems and Reproductive Concerns   Infertility warning: Sexual problems and reproduction concerns may occur. In both men and women, this drug may affect your ability to have children. This cannot be determined before your treatment. Speak with your doctor or nurse if you plan to have children. Ask for information on sperm or egg banking.   In men, this drug may interfere with your ability to make sperm, but it should not change your ability to have sexual relations.   In women, menstrual bleeding may become irregular or stop while you are receiving this drug. Do not assume that you cannot become pregnant if you do not have a menstrual period.   Women may experience signs of menopause like vaginal dryness, itching, and pain during sexual  relations   Genetic counseling is available for you to talk about the effects of this drug therapy on future pregnancies. Also, a genetic counselor can look at the possible risk of problems in the unborn baby due to this medicine if an exposure happens during pregnancy.   Gemcitabine (Gemzar)  About This Drug Gemcitabine is used to treat cancer. It is given in the vein (IV).  It will take 30 minutes to infuse.  Possible Side Effects  Bone marrow suppression. This is a decrease in the number of white blood cells, red blood cells, and platelets. This may raise your risk of infection, make you tired and weak (fatigue), and raise your risk of bleeding.   Fever   Trouble breathing   Nausea and throwing up (vomiting)   Changes in your liver function   Increased protein in your urine, which can affect how your kidneys work   Blood in your urine   Rash   Swelling of your legs, ankles and/or feet  Note: Each of the side effects above was reported in 20% or greater of patients treated with Gemcitabine. Not all possible side effects are included above.  Warnings and Precautions  Severe bone marrow suppression   Inflammation (swelling) of the lungs and/or thickening of the lung tissues, which may be lifethreatening. You may have a dry cough or trouble breathing.   Changes in your kidney function, which can cause kidney failure   Changes in your liver function, which can cause liver failure and may be life-threatening   If you have received radiation treatments, your skin may become red and/or you may develop soreness of the mouth and throat after gemcitabine. This reaction is called recall. Your body is recalling, or remembering, that it had radiation therapy.   A syndrome where fluid from your veins can leak into your tissues and cause a decrease in your blood pressure and fluid to accumulate in your tissues and/or lungs.   A syndrome can occur that causes changes to kidney and  liver function in combination with a decrease in red blood cells. Kidney failure may result which may be life-threatening.   Changes in your central nervous system can happen. The central nervous system is made up of your brain and  spinal cord. You could feel extreme tiredness, agitation, confusion, hallucinations (see or hear things that are not there), have trouble understanding or speaking, loss of control of your bowels or bladder, eyesight changes, numbness or lack of strength to your arms, legs, face, or body, seizures or coma. If you start to have any of these symptoms let your doctor know right away.  Note: Some of the side effects above are very rare. If you have concerns and/or questions, please discuss them with your medical team.  Important Information  This drug may be present in the saliva, tears, sweat, urine, stool, vomit, semen, and vaginal secretions. Talk to your doctor and/or your nurse about the necessary precautions to take during this time.  Treating Side Effects  Manage tiredness by pacing your activities for the day.   Be sure to include periods of rest between energy-draining activities.   To decrease the risk of infection, wash your hands regularly.   Avoid close contact with people who have a cold, the flu, or other infections.   Take your temperature as your doctor or nurse tells you, and whenever you feel like you may have a fever.   To help decrease bleeding, use a soft toothbrush. Check with your nurse before using dental floss.   Be very careful when using knives or tools.   Use an electric shaver instead of a razor.   Drink plenty of fluids (a minimum of eight glasses per day is recommended).   If you throw up or have loose bowel movements, you should drink more fluids so that you do not become dehydrated (lack of water in the body from losing too much fluid).   To help with nausea and vomiting, eat small, frequent meals instead of three large meals a  day. Choose foods and drinks that are at room temperature. Ask your nurse or doctor about other helpful tips and medicine that is available to help stop or lessen these symptoms.   If you get a rash do not put anything on it unless your doctor or nurse says you may. Keep the area around the rash clean and dry. Ask your doctor for medicine if your rash bothers you.   If you received radiation, and your skin becomes red or irritated again, or you develop soreness of the mouth and throat, follow the same care instructions you did during radiation treatment. Be sure to tell the nurse or doctor administering your chemotherapy about your skin changes.  Food and Drug Interactions  There are no known interactions of gemcitabine with food.   This drug may interact with other medicines. Tell your doctor and pharmacist about all the prescription and over-the-counter medicines and dietary supplements (vitamins, minerals, herbs and others) that you are taking at this time. Also, check with your doctor or pharmacist before starting any new prescription or over-the-counter medicines, or dietary supplements to make sure that there are no interactions.  When to Call the Doctor Call your doctor or nurse if you have any of these symptoms and/or any new or unusual symptoms:   Fever of 100.4 F (38 C) or higher   Chills   Tiredness that interferes with your daily activities   Feeling dizzy or lightheaded   Pain in your chest   Dry cough   Wheezing and/or trouble breathing   Confusion and/or agitation   Symptoms of a seizure such as confusion, blacking out, passing out, loss of hearing or vision, blurred vision, unusual smells or tastes (such  as burning rubber), trouble talking, tremors or shaking in parts or all of the body, repeated body movements, tense muscles that do not relax, and loss of control of urine and bowels. If you or your family member suspects you are having a seizure, call 911 right  away.   Hallucinations   Trouble understanding or speaking   Blurry vision or changes in your eyesight   Numbness or lack of strength to your arms, legs, face, or body   Easy bleeding or bruising   Nausea that stops you from eating or drinking and/or is not relieved by prescribed medicines   Throwing up    Swelling of legs, ankles, or feet   Weight gain of 5 pounds in one week (fluid retention)   Blood in urine   Decreased urine or very dark urine   Foamy or bubbly-looking urine   A new rash/itching or a rash that is not relieved by prescribed medicines   Signs of possible liver problems: dark urine, pale bowel movements, bad stomach pain, feeling very tired and weak, unusual itching, or yellowing of the eyes or skin   If you think you may be pregnant or may have impregnated your partner  Reproduction Warnings   Pregnancy warning: This drug can have harmful effects on the unborn baby. Women of childbearing potential should use effective methods of birth control during your cancer treatment and for 6 months after treatment. Men with male partners of childbearing potential should use effective methods of birth control during your cancer treatment and for 3 months after your cancer treatment. Let your doctor know right away if you think you may be pregnant or may have impregnated your partner.   Breastfeeding warning: Women should not breastfeed during treatment and for 1 week after treatment because this drug could enter the breast milk and cause harm to a breastfeeding baby.   Fertility warning: In men, this drug may affect your ability to have children in the future. Talk with your doctor or nurse if you plan to have children. Ask for information on sperm banking.   Durvalumab (Imfinzi)    About This Drug  Durvalumab is used to treat cancer. This drug is given in the vein (IV). It will take 1 hour to infuse.    Possible Side Effects   Nausea   Tiredness   Weakness    Inflammation (swelling) of the lungs. You may have a dry cough or trouble breathing.   Cough   Trouble breathing   Upper respiratory tract infection   Rash   Hair loss. Hair loss is often temporary, although with certain medicine, hair loss can sometimes be permanent. Hair loss may happen suddenly or gradually. If you lose hair, you may lose it from your head, face, armpits, pubic area, chest, and/or legs. You may also notice your hair getting thin.   Note: Each of the side effects above was reported in 20% or greater of patients treated with durvalumab. Your side effects may be different depending on your specific condition. Not all possible side effects are included above.   Warnings and Precautions   This drug works with your immune system and can cause inflammation (swelling) in any of your organs and tissues and can change how they work. This may put you at risk for developing serious medical problems, which can be life-threatening. These side effects may require treatment with steroids at the discretion of your doctor.    Severe inflammation of the lungs, which can be  life-threatening. You may have a dry cough or trouble breathing.    Colitis, which is swelling (inflammation) in the colon. The symptoms are diarrhea (loose bowel movements), stomach cramping, and sometimes blood in the bowel movements.    Changes in your central nervous system can happen. The central nervous system is made up of your brain and spinal cord. You could feel extreme tiredness, agitation, confusion, hallucinations (see or hear things that are not there), trouble understanding or speaking, loss of control of your bowels or bladder, eyesight changes, numbness, or lack of strength to your arms, legs, face, or body, and coma. If you start to have any of these symptoms let your doctor know right away.    Severe changes in your liver function, which can cause liver failure and be life-threatening.    This drug may  affect some of your hormone glands (especially the thyroid, adrenals, pituitary, and pancreas).    Blood sugar levels may change, and you may develop diabetes. If you already have diabetes, changes may need to be made to your diabetes medication.    Changes in your kidney function    Allergic skin reaction, which can be life-threatening. You may develop blisters on your skin that are filled with fluid or a severe red rash all over your body that may be painful.    While you are getting this drug in your vein (IV), you may have a reaction to the drug which can be life-threatening. Sometimes you may be given medication to stop or lessen these side effects. Your nurse will check you closely for these signs: fever or shaking chills, flushing, facial swelling, feeling dizzy, headache, trouble breathing, rash, itching, chest tightness, or chest pain. If this happens, call 911 for emergency care.    Increased risk of serious complications which can be life-threatening such as graft versus host disease (GVHD) in patients who undergo a stem cell transplant before or after receiving durvalumab.    Increased risk of organ rejection in patients who have received donor organs. Note: Some of the side effects above are very rare. If you have concerns and/or questions, please discuss them with your medical team. Important Information    This drug may be present in the saliva, tears, sweat, urine, stool, vomit, semen, and vaginal secretions. Talk to your doctor and/or your nurse about the necessary precautions to take during this time.   Treating Side Effects   Manage tiredness by pacing your activities for the day.    Be sure to include periods of rest between energy-draining activities.    To help decrease the risk of bleeding, use a soft toothbrush. Check with your nurse before using dental floss.  Be very careful when using knives or tools.    Use an electric shaver instead of a razor.    Drink plenty of  fluids (a minimum of eight glasses per day is recommended).    To help with nausea, eat small, frequent meals instead of three large meals a day. Choose foods and drinks that are at room temperature.    If you have diarrhea, eat low-fiber foods that are high in protein and calories, and avoid foods that can irritate your digestive tracts or lead to cramping. You should also drink more fluids so that you do not become dehydrated (lack of water in the body from losing too much fluid).    Ask your doctor or nurse about medicine that is available to help stop or lessen diarrhea, and/or nausea.  If you have diabetes, keep good control of your blood sugar level. Tell your nurse or your doctor if your glucose levels are higher or lower than normal.    Keeping your pain under control is important to your well-being. Please tell your doctor or nurse if you are experiencing pain.    If you get a rash do not put anything on it unless your doctor or nurse says you may. Keep the area around the rash clean and dry. Ask your doctor for medicine if your rash bothers you.    To help with hair loss, wash with a mild shampoo and avoid washing your hair every day. Avoid coloring your hair.    Avoid rubbing your scalp, pat your hair or scalp dry.    Limit your use of hair spray, electric curlers, blow dryers, and curling irons.    If you are interested in getting a wig, talk to your nurse and they can help you get in touch with programs in your local area.    If you have numbness and tingling in your hands and feet, be careful when cooking, walking, and handling sharp objects and hot liquids.    Infusion reactions may happen after your infusion. If this happens, call 911 for emergency care.   Food and Drug Interactions   There are no known interactions of durvalumab with food.    This drug may interact with other medicines. Tell your doctor and pharmacist about all the prescription and over-the-counter  medicines and dietary supplements (vitamins, minerals, herbs, and others) that you are taking at this time. Also, check with your doctor or pharmacist before starting any new prescription or over-the-counter medicines, or dietary supplements to make sure that there are no interactions.   When to Call the Doctor  Not all possible side effects are included. Some of these side effects, although rare, can be lifethreatening.   Lung problems:   Inflammation of the lungs   Cough   Trouble breathing   Upper respiratory tract infection Call your doctor or nurse if you have any of these symptoms:   Wheezing and/or trouble breathing   New or worsening cough   Coughing up yellow, green, or bloody mucus   Chest pain   Stomach problems:   Decreased appetite (decreased hunger)   Nausea and vomiting (throwing up)   Diarrhea (loose bowel movements)   Constipation (unable to move bowels)   Pain in your abdomen   Inflammation of your colon   Blood in your stool   Call your doctor or nurse if you have any of these symptoms:   Nausea that stops you from eating or drinking or is not relieved by prescribed medicine    Throwing up more than 3 times a day    Lasting loss of appetite or rapid weight loss of five pounds in a week    Diarrhea, 4 times in one day or diarrhea with lack of strength or a feeling of being dizzy    No bowel movement for 3 days or when you feel uncomfortable    Pain in your abdomen that does not go away    Blood in your stool (bright red, or black/tarry)   Liver problems:   Changes in your liver function   Call your doctor or nurse if you have any of these symptoms:   Yellowing of the eyes or skin    Dark urine    Pale bowel movements    Pain on the  right side of your abdomen that does not go away    Feeling very tired and weak    Unusual itching     Easy bleeding or bruising   Hormone gland problems:   Changes in some of your hormone glands (especially the  thyroid, adrenals, pituitary and pancreas)   Blood sugar levels may change, and you may develop diabetes   Call your doctor or nurse if you have any of these symptoms:   Headache that does not go away    Tiredness that interferes with your daily activities    Feeling dizzy or lightheaded    Changes in mood or behavior such as irritability and/or feeling forgetful    Shakiness    Weight loss or weight gain    Nausea    Abnormal blood sugar    Unusual thirst or passing urine often    Feeling cold   Kidney problems:   Changes in your kidney function   Urinary tract infection   Call your doctor or nurse if you have any of these symptoms:   Decreased or very dark urine    Cloudy urine and/or urine that smells bad    Difficulty urinating    Pain or burning when you pass urine    Feeling like you have to pass urine often, but not much comes out when you do    Tender or heavy feeling in your lower abdomen    Pain on one side of your back under your ribs   Skin problems:   Rash and itching   Soreness of the mouth and throat   Allergic skin reaction   Call your doctor or nurse if you have any of these symptoms:   New rash and/or itching    Fluid-filled bumps/blisters    Rash that is not relieved by prescribed medicines    Red areas, white patches, or sores in your mouth that hurt   Inflammation of the brain:   Changes in your brain and spinal cord   Headache   Effects on the nerves   Call your doctor or nurse if you have any of these symptoms:   Headache that does not go away    Extreme tiredness, agitation, or confusion    Seizures    Hallucinations    Trouble understanding or speaking    Loss of control of bowels or bladder    Numbness or lack of strength to your arms, legs, face, or body    Numbness, tingling, pins, and needles, or pain in your arms, hands, legs, or feet   Other problems:   Low red blood cells, and platelets   Fever    Inflammation of your eye and/or other changes in vision   Allergic reaction to the drug   Heart problems   Electrolyte changes   Muscle, bone, and joint pain   Call your doctor or nurse if you have any of these symptoms:   Fever of 100.4 F (38 C) or higher    Chills, flushing    Easy bleeding or bruising    Blurred vision or other changes in eyesight    Sensitivity to light    Feeling that your heart is beating fast or in a not normal way (palpitations)    Signs of infusion reaction: fever or shaking chills, flushing, facial swelling, feeling dizzy, headache, trouble breathing, rash, itching, chest tightness, or chest pain. If this happens, call 911 for emergency care.    Pain that does  not go away, or is not relieved by prescribed medicines    Extreme muscle weakness   Reproduction Warnings   Pregnancy warning: This drug can have harmful effects on the unborn baby. Women of childbearing potential should use effective methods of birth control during your cancer treatment and for at least 3 months after stopping treatment. Let your doctor know right away if you think you may be pregnant.    Breastfeeding warning: Women should not breastfeed during treatment and for at least 3 months after stopping treatment because this drug could enter the breast milk and cause harm to a breastfeeding baby.    Fertility warning: Fertility studies have not been done with this drug. Talk with your doctor or nurse if you plan to have children. Ask for information on sperm or egg banking.    SELF CARE ACTIVITIES WHILE RECEIVING CHEMOTHERAPY:  Hydration Increase your fluid intake 48 hours prior to treatment and drink at least 8 to 12 cups (64 ounces) of water/decaffeinated beverages per day after treatment. You can still have your cup of coffee or soda but these beverages do not count as part of your 8 to 12 cups that you need to drink daily. No alcohol intake.  Medications Continue taking your  normal prescription medication as prescribed.  If you start any new herbal or new supplements please let us know first to make sure it is safe.  Mouth Care Have teeth cleaned professionally before starting treatment. Keep dentures and partial plates clean. Use soft toothbrush and do not use mouthwashes that contain alcohol. Biotene is a good mouthwash that is available at most pharmacies or may be ordered by calling [phone]. Use warm salt water gargles (1 teaspoon salt per 1 quart warm water) before and after meals and at bedtime. If you need dental work, please let the doctor know before you go for your appointment so that we can coordinate the best possible time for you in regards to your chemo regimen. You need to also let your dentist know that you are actively taking chemo. We may need to do labs prior to your dental appointment.  Skin Care Always use sunscreen that has not expired and with SPF (Sun Protection Factor) of 50 or higher. Wear hats to protect your head from the sun. Remember to use sunscreen on your hands, ears, face, & feet.  Use good moisturizing lotions such as udder cream, eucerin, or even Vaseline. Some chemotherapies can cause dry skin, color changes in your skin and nails.    Avoid long, hot showers or baths. Use gentle, fragrance-free soaps and laundry detergent. Use moisturizers, preferably creams or ointments rather than lotions because the thicker consistency is better at preventing skin dehydration. Apply the cream or ointment within 15 minutes of showering. Reapply moisturizer at night, and moisturize your hands every time after you wash them.  Hair Loss (if your doctor says your hair will fall out)  If your doctor says that your hair is likely to fall out, decide before you begin chemo whether you want to wear a wig. You may want to shop before treatment to match your hair color. Hats, turbans, and scarves can also camouflage hair loss, although some people  prefer to leave their heads uncovered. If you go bare-headed outdoors, be sure to use sunscreen on your scalp. Cut your hair short. It eases the inconvenience of shedding lots of hair, but it also can reduce the emotional impact of watching your hair fall out. Don't  perm or color your hair during chemotherapy. Those chemical treatments are already damaging to hair and can enhance hair loss. Once your chemo treatments are done and your hair has grown back, it's OK to resume dyeing or perming hair.  With chemotherapy, hair loss is almost always temporary. But when it grows back, it may be a different color or texture. In older adults who still had hair color before chemotherapy, the new growth may be completely gray.  Often, new hair is very fine and soft.  Infection Prevention Please wash your hands for at least 30 seconds using warm soapy water. Handwashing is the #1 way to prevent the spread of germs. Stay away from sick people or people who are getting over a cold. If you develop respiratory systems such as green/yellow mucus production or productive cough or persistent cough let us know and we will see if you need an antibiotic. It is a good idea to keep a pair of gloves on when going into grocery stores/[employer] to decrease your risk of coming into contact with germs on the carts, etc. Carry alcohol hand gel with you at all times and use it frequently if out in public. If your temperature reaches 100.4 or higher please call the clinic and let us know.  If it is after hours or on the weekend please go to the ER if your temperature is over 100.4.  Please have your own personal thermometer at home to use.    Sex and bodily fluids If you are going to have sex, a condom must be used to protect the person that isn't taking chemotherapy. Chemo can decrease your libido (sex drive). For a few days after chemotherapy, chemotherapy can be excreted through your bodily fluids.  When using the toilet please close the  lid and flush the toilet twice.  Do this for a few day after you have had chemotherapy.   Effects of chemotherapy on your sex life Some changes are simple and won't last long. They won't affect your sex life permanently.  Sometimes you may feel: too tired not strong enough to be very active sick or sore  not in the mood anxious or low  Your anxiety might not seem related to sex. For example, you may be worried about the cancer and how your treatment is going. Or you may be worried about money, or about how you family are coping with your illness.  These things can cause stress, which can affect your interest in sex. It's important to talk to your partner about how you feel.  Remember - the changes to your sex life don't usually last long. There's usually no medical reason to stop having sex during chemo. The drugs won't have any long term physical effects on your performance or enjoyment of sex. Cancer can't be passed on to your partner during sex  Contraception It's important to use reliable contraception during treatment. Avoid getting pregnant while you or your partner are having chemotherapy. This is because the drugs may harm the baby. Sometimes chemotherapy drugs can leave a man or woman infertile.  This means you would not be able to have children in the future. You might want to talk to someone about permanent infertility. It can be very difficult to learn that you may no longer be able to have children. Some people find counselling helpful. There might be ways to preserve your fertility, although this is easier for men than for women. You may want to speak to a  fertility expert. You can talk about sperm banking or harvesting your eggs. You can also ask about other fertility options, such as donor eggs. If you have or have had breast cancer, your doctor might advise you not to take the contraceptive pill. This is because the hormones in it might affect the cancer. It is not known for sure  whether or not chemotherapy drugs can be passed on through semen or secretions from the vagina. Because of this some doctors advise people to use a barrier method if you have sex during treatment. This applies to vaginal, anal or oral sex. Generally, doctors advise a barrier method only for the time you are actually having the treatment and for about a week after your treatment. Advice like this can be worrying, but this does not mean that you have to avoid being intimate with your partner. You can still have close contact with your partner and continue to enjoy sex.  Animals If you have cats or birds we just ask that you not change the litter or change the cage.  Please have someone else do this for you while you are on chemotherapy.   Food Safety During and After Cancer Treatment Food safety is important for people both during and after cancer treatment. Cancer and cancer treatments, such as chemotherapy, radiation therapy, and stem cell/bone marrow transplantation, often weaken the immune system. This makes it harder for your body to protect itself from foodborne illness, also called food poisoning. Foodborne illness is caused by eating food that contains harmful bacteria, parasites, or viruses.  Foods to avoid Some foods have a higher risk of becoming tainted with bacteria. These include: Unwashed fresh fruit and vegetables, especially leafy vegetables that can hide dirt and other contaminants Raw sprouts, such as alfalfa sprouts Raw or undercooked beef, especially ground beef, or other raw or undercooked meat and poultry Fatty, fried, or spicy foods immediately before or after treatment.  These can sit heavy on your stomach and make you feel nauseous. Raw or undercooked shellfish, such as oysters. Sushi and sashimi, which often contain raw fish.  Unpasteurized beverages, such as unpasteurized fruit juices, raw milk, raw yogurt, or cider Undercooked eggs, such as soft boiled, over easy, and  poached; raw, unpasteurized eggs; or foods made with raw egg, such as homemade raw cookie dough and homemade mayonnaise  Simple steps for food safety  Shop smart. Do not buy food stored or displayed in an unclean area. Do not buy bruised or damaged fruits or vegetables. Do not buy cans that have cracks, dents, or bulges. Pick up foods that can spoil at the end of your shopping trip and store them in a cooler on the way home.  Prepare and clean up foods carefully. Rinse all fresh fruits and vegetables under running water, and dry them with a clean towel or paper towel. Clean the top of cans before opening them. After preparing food, wash your hands for 20 seconds with hot water and soap. Pay special attention to areas between fingers and under nails. Clean your utensils and dishes with hot water and soap. Disinfect your kitchen and cutting boards using 1 teaspoon of liquid, unscented bleach mixed into 1 quart of water.    Dispose of old food. Eat canned and packaged food before its expiration date (the use by or best before date). Consume refrigerated leftovers within 3 to 4 days. After that time, throw out the food. Even if the food does not smell or look spoiled, it  still may be unsafe. Some bacteria, such as Listeria, can grow even on foods stored in the refrigerator if they are kept for too long.  Take precautions when eating out. At restaurants, avoid buffets and salad bars where food sits out for a long time and comes in contact with many people. Food can become contaminated when someone with a virus, often a norovirus, or another bug handles it. Put any leftover food in a to-go container yourself, rather than having the server do it. And, refrigerate leftovers as soon as you get home. Choose restaurants that are clean and that are willing to prepare your food as you order it cooked.   AT HOME MEDICATIONS:                                                                                                                                                                 Compazine/Prochlorperazine 10mg  tablet. Take 1 tablet every 6 hours as needed for nausea/vomiting. (This can make you sleepy)   EMLA cream. Apply a quarter size amount to port site 1 hour prior to chemo. Do not rub in. Cover with plastic wrap.    Diarrhea Sheet   If you are having loose stools/diarrhea, please purchase Imodium and begin taking as outlined:  At the first sign of poorly formed or loose stools you should begin taking Imodium (loperamide) 2 mg capsules.  Take two tablets (4mg ) followed by one tablet (2mg ) every 2 hours - DO NOT EXCEED 8 tablets in 24 hours.  If it is bedtime and you are having loose stools, take 2 tablets at bedtime, then 2 tablets every 4 hours until morning.   Always call the [facility] if you are having loose stools/diarrhea that you can't get under control.  Loose stools/diarrhea leads to dehydration (loss of water) in your body.  We have other options of trying to get the loose stools/diarrhea to stop but you must let us know!   Constipation Sheet  Colace - 100 mg capsules - take 2 capsules daily.  If this doesn't help then you can increase to 2 capsules twice daily.  Please call if the above does not work for you. Do not go more than 2 days without a bowel movement.  It is very important that you do not become constipated.  It will make you feel sick to your stomach (nausea) and can cause abdominal pain and vomiting.  Nausea Sheet   Compazine/Prochlorperazine 10mg  tablet. Take 1 tablet every 6 hours as needed for nausea/vomiting (This can make you drowsy).  If you are having persistent nausea (nausea that does not stop) please call the [facility] and let us know the amount of nausea that you are experiencing.  If you begin to vomit, you need to  call the [facility] and if it is the weekend and you have vomited more than one time and can't get it to stop-go to the  Emergency Room.  Persistent nausea/vomiting can lead to dehydration (loss of fluid in your body) and will make you feel very weak and unwell. Ice chips, sips of clear liquids, foods that are at room temperature, crackers, and toast tend to be better tolerated.   SYMPTOMS TO REPORT AS SOON AS POSSIBLE AFTER TREATMENT:  FEVER GREATER THAN 100.4 F  CHILLS WITH OR WITHOUT FEVER  NAUSEA AND VOMITING THAT IS NOT CONTROLLED WITH YOUR NAUSEA MEDICATION  UNUSUAL SHORTNESS OF BREATH  UNUSUAL BRUISING OR BLEEDING  TENDERNESS IN MOUTH AND THROAT WITH OR WITHOUT PRESENCE OF ULCERS  URINARY PROBLEMS  BOWEL PROBLEMS  UNUSUAL RASH      Wear comfortable clothing and clothing appropriate for easy access to any Portacath or PICC line. Let us know if there is anything that we can do to make your therapy better!    What to do if you need assistance after hours or on the weekends: CALL [phone].  HOLD on the line, do not hang up.  You will hear multiple messages but at the end you will be connected with a nurse triage line.  They will contact the doctor if necessary.  Most of the time they will be able to assist you.  Do not call the hospital operator.      I have been informed and understand all of the instructions given to me and have received a copy. I have been instructed to call the clinic [phone] or my family physician as soon as possible for continued medical care, if indicated. I do not have any more questions at this time but understand that I may call the [facility] or the Patient Navigator at [phone] during office hours should I have questions or need assistance in obtaining follow-up care.

## 2021-10-03 ENCOUNTER — Encounter (HOSPITAL_COMMUNITY): Payer: Self-pay | Admitting: Emergency Medicine

## 2021-10-03 NOTE — Progress Notes (Signed)
[employer] approved PA for Lidocaine 2.5% from 09/30/21-12/29/21 for 30 grams for a 30 day supply. Auth #[id number].

## 2021-10-04 ENCOUNTER — Other Ambulatory Visit: Payer: Self-pay

## 2021-10-04 ENCOUNTER — Inpatient Hospital Stay (HOSPITAL_COMMUNITY): Payer: Medicare Other

## 2021-10-04 DIAGNOSIS — Z95828 Presence of other vascular implants and grafts: Secondary | ICD-10-CM

## 2021-10-04 DIAGNOSIS — Z08 Encounter for follow-up examination after completed treatment for malignant neoplasm: Secondary | ICD-10-CM

## 2021-10-04 DIAGNOSIS — C221 Intrahepatic bile duct carcinoma: Secondary | ICD-10-CM

## 2021-10-04 DIAGNOSIS — Z8509 Personal history of malignant neoplasm of other digestive organs: Secondary | ICD-10-CM

## 2021-10-04 NOTE — Progress Notes (Signed)
Chemotherapy/immunotherapy education packet given and discussed with pt and family in detail.  Discussed diagnosis and staging, tx regimen, and intent of tx.  Reviewed chemotherapy/immunotherapy medications and side effects, as well as pre-medications.  Instructed on how to manage side effects at home, and when to call the clinic.  Importance of fever/chills discussed with pt and family. Discussed precautions to implement at home after receiving tx, as well as self care strategies. Phone numbers provided for clinic during regular working hours, also how to reach the clinic after hours and on weekends. Pt and family provided the opportunity to ask questions - all questions answered to pt's and family satisfaction.  °

## 2021-10-05 ENCOUNTER — Ambulatory Visit: Payer: Medicare Other | Admitting: Surgery

## 2021-10-05 ENCOUNTER — Inpatient Hospital Stay (HOSPITAL_BASED_OUTPATIENT_CLINIC_OR_DEPARTMENT_OTHER): Payer: Medicare Other | Admitting: Hematology

## 2021-10-05 ENCOUNTER — Inpatient Hospital Stay (HOSPITAL_COMMUNITY): Payer: Medicare Other

## 2021-10-05 VITALS — BP 123/74 | HR 84 | Temp 97.2°F | Resp 18

## 2021-10-05 DIAGNOSIS — Z95828 Presence of other vascular implants and grafts: Secondary | ICD-10-CM

## 2021-10-05 DIAGNOSIS — C787 Secondary malignant neoplasm of liver and intrahepatic bile duct: Secondary | ICD-10-CM

## 2021-10-05 DIAGNOSIS — Z08 Encounter for follow-up examination after completed treatment for malignant neoplasm: Secondary | ICD-10-CM

## 2021-10-05 DIAGNOSIS — C221 Intrahepatic bile duct carcinoma: Secondary | ICD-10-CM

## 2021-10-05 DIAGNOSIS — Z5112 Encounter for antineoplastic immunotherapy: Secondary | ICD-10-CM | POA: Diagnosis not present

## 2021-10-05 DIAGNOSIS — R180 Malignant ascites: Secondary | ICD-10-CM | POA: Diagnosis not present

## 2021-10-05 LAB — CBC WITH DIFFERENTIAL/PLATELET
Abs Immature Granulocytes: 0.03 10*3/uL (ref 0.00–0.07)
Basophils Absolute: 0 10*3/uL (ref 0.0–0.1)
Basophils Relative: 1 %
Eosinophils Absolute: 0.2 10*3/uL (ref 0.0–0.5)
Eosinophils Relative: 3 %
HCT: 46.4 % (ref 39.0–52.0)
Hemoglobin: 14.2 g/dL (ref 13.0–17.0)
Immature Granulocytes: 0 %
Lymphocytes Relative: 9 %
Lymphs Abs: 0.7 10*3/uL (ref 0.7–4.0)
MCH: 24.7 pg — ABNORMAL LOW (ref 26.0–34.0)
MCHC: 30.6 g/dL (ref 30.0–36.0)
MCV: 80.8 fL (ref 80.0–100.0)
Monocytes Absolute: 0.9 10*3/uL (ref 0.1–1.0)
Monocytes Relative: 11 %
Neutro Abs: 5.9 10*3/uL (ref 1.7–7.7)
Neutrophils Relative %: 76 %
Platelets: 553 10*3/uL — ABNORMAL HIGH (ref 150–400)
RBC: 5.74 MIL/uL (ref 4.22–5.81)
RDW: 15.4 % (ref 11.5–15.5)
WBC: 7.8 10*3/uL (ref 4.0–10.5)
nRBC: 0 % (ref 0.0–0.2)

## 2021-10-05 LAB — COMPREHENSIVE METABOLIC PANEL
ALT: 23 U/L (ref 0–44)
AST: 32 U/L (ref 15–41)
Albumin: 3 g/dL — ABNORMAL LOW (ref 3.5–5.0)
Alkaline Phosphatase: 76 U/L (ref 38–126)
Anion gap: 8 (ref 5–15)
BUN: 30 mg/dL — ABNORMAL HIGH (ref 8–23)
CO2: 26 mmol/L (ref 22–32)
Calcium: 9.2 mg/dL (ref 8.9–10.3)
Chloride: 98 mmol/L (ref 98–111)
Creatinine, Ser: 1.01 mg/dL (ref 0.61–1.24)
GFR, Estimated: >60 mL/min (ref >60)
Glucose, Bld: 109 mg/dL — ABNORMAL HIGH (ref 70–99)
Potassium: 4.5 mmol/L (ref 3.5–5.1)
Sodium: 132 mmol/L — ABNORMAL LOW (ref 135–145)
Total Bilirubin: 0.4 mg/dL (ref 0.3–1.2)
Total Protein: 6.1 g/dL — ABNORMAL LOW (ref 6.5–8.1)

## 2021-10-05 LAB — MAGNESIUM: Magnesium: 1.9 mg/dL (ref 1.7–2.4)

## 2021-10-05 LAB — LACTATE DEHYDROGENASE: LDH: 266 U/L — ABNORMAL HIGH (ref 98–192)

## 2021-10-05 MED ORDER — PALONOSETRON HCL INJECTION 0.25 MG/5ML
0.2500 mg | Freq: Once | INTRAVENOUS | Status: AC
  Administered 2021-10-05: 11:00:00 0.25 mg via INTRAVENOUS
  Filled 2021-10-05: qty 5

## 2021-10-05 MED ORDER — SODIUM CHLORIDE 0.9 % IV SOLN
25.0000 mg/m2 | Freq: Once | INTRAVENOUS | Status: AC
  Administered 2021-10-05: 13:00:00 62 mg via INTRAVENOUS
  Filled 2021-10-05: qty 62

## 2021-10-05 MED ORDER — HEPARIN SOD (PORK) LOCK FLUSH 100 UNIT/ML IV SOLN
500.0000 [IU] | Freq: Once | INTRAVENOUS | Status: AC | PRN
  Administered 2021-10-05: 16:00:00 500 [IU]

## 2021-10-05 MED ORDER — SODIUM CHLORIDE 0.9% FLUSH
10.0000 mL | INTRAVENOUS | Status: DC | PRN
  Administered 2021-10-05: 16:00:00 10 mL

## 2021-10-05 MED ORDER — SODIUM CHLORIDE 0.9 % IV SOLN
10.0000 mg | Freq: Once | INTRAVENOUS | Status: AC
  Administered 2021-10-05: 11:00:00 10 mg via INTRAVENOUS
  Filled 2021-10-05: qty 10

## 2021-10-05 MED ORDER — SODIUM CHLORIDE 0.9 % IV SOLN
1000.0000 mg/m2 | Freq: Once | INTRAVENOUS | Status: AC
  Administered 2021-10-05: 13:00:00 2470 mg via INTRAVENOUS
  Filled 2021-10-05: qty 52.6

## 2021-10-05 MED ORDER — POTASSIUM CHLORIDE IN NACL 20-0.9 MEQ/L-% IV SOLN
Freq: Once | INTRAVENOUS | Status: AC
  Filled 2021-10-05: qty 1000

## 2021-10-05 MED ORDER — SODIUM CHLORIDE 0.9 % IV SOLN: Freq: Once | INTRAVENOUS | Status: AC

## 2021-10-05 MED ORDER — SODIUM CHLORIDE 0.9 % IV SOLN
150.0000 mg | Freq: Once | INTRAVENOUS | Status: AC
  Administered 2021-10-05: 11:00:00 150 mg via INTRAVENOUS
  Filled 2021-10-05: qty 150

## 2021-10-05 MED ORDER — MAGNESIUM SULFATE 2 GM/50ML IV SOLN
2.0000 g | Freq: Once | INTRAVENOUS | Status: AC
  Administered 2021-10-05: 10:00:00 2 g via INTRAVENOUS
  Filled 2021-10-05: qty 50

## 2021-10-05 MED ORDER — SODIUM CHLORIDE 0.9 % IV SOLN
1500.0000 mg | Freq: Once | INTRAVENOUS | Status: AC
  Administered 2021-10-05: 15:00:00 1500 mg via INTRAVENOUS
  Filled 2021-10-05: qty 30

## 2021-10-05 NOTE — Progress Notes (Signed)
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, a 66 y.o. male, returns for routine follow-up and consideration for next cycle of chemotherapy. Marvin Carr was last seen on 09/26/2021.  Due for cycle #1 of Cisplatin + Gemcitabine today.   Overall, he tells me he has been feeling pretty well. He reports soreness in his abdomen and flank bilaterally. He continues to take tramadol prn, 1-2 times daily. He reports diarrhea starting this morning and continued indigestion for which he is currently on Prilosec.    Overall, he feels ready for next cycle of chemo today.   REVIEW OF SYSTEMS:  Review of Systems  Constitutional:  Negative for appetite change and fatigue.  Respiratory:  Positive for cough and shortness of breath.   Gastrointestinal:  Positive for abdominal pain (6/10), diarrhea and nausea.  Musculoskeletal:  Positive for flank pain (6/10).  Psychiatric/Behavioral:  Positive for sleep disturbance.   All other systems reviewed and are negative.  PAST MEDICAL/SURGICAL HISTORY:  Past Medical History:  Diagnosis Date   Anxiety     Bladder cancer ([place]) 09/11/2009   CAD (coronary artery disease), native coronary artery    a. Mildly elevated troponin 03/2013, cath with nonobstructive disease including 50% AV groove distal stenosis before large OM   Essential hypertension    Headache(784.0)    History of migraines   Hyperglycemia    Mixed hyperlipidemia    Neuropathy    Obesity    Port-A-Cath in place 09/30/2021   Pre-diabetes    Seasonal allergies    Sleep apnea    On CPAP   Past Surgical History:  Procedure Laterality Date   BIOPSY  09/23/2021   Procedure: BIOPSY;  Surgeon: Harvel Quale, MD;  [facility];  Service: Gastroenterology;;   COLONOSCOPY  06/28/2011   Procedure: COLONOSCOPY;  Surgeon: Rogene Houston, MD;  [facility];  Service: Endoscopy;  Laterality: N/A;   COLONOSCOPY WITH PROPOFOL N/A 09/23/2021   Procedure: COLONOSCOPY WITH PROPOFOL;  Surgeon: Harvel Quale, MD;  [facility];  Service: Gastroenterology;  Laterality: N/A;  940   ESOPHAGOGASTRODUODENOSCOPY (EGD) WITH PROPOFOL N/A 09/23/2021   Procedure: ESOPHAGOGASTRODUODENOSCOPY (EGD) WITH PROPOFOL;  Surgeon: Harvel Quale, MD;  [facility];  Service: Gastroenterology;  Laterality: N/A;   IR IMAGING GUIDED PORT INSERTION  09/28/2021   IR PARACENTESIS  09/28/2021   JOINT REPLACEMENT Right    hip   LEFT HEART CATHETERIZATION WITH CORONARY ANGIOGRAM N/A 03/31/2013   Procedure: LEFT HEART CATHETERIZATION WITH CORONARY ANGIOGRAM;  Surgeon: Minus Breeding, MD;  Location: [facility] CATH LAB;  Service: Cardiovascular;  Laterality: N/A;  POLYPECTOMY  09/23/2021   Procedure: POLYPECTOMY;  Surgeon: Montez Morita, Quillian Quince, MD;  [facility];  Service: Gastroenterology;;   TURBT  09/11/2009    SOCIAL HISTORY:  Social History   Socioeconomic History   Marital status: Divorced    Spouse name: Not on file   Number of children: 0   Years of education: college   Highest  education level: Not on file  Occupational History    Employer: SELF-EMPLOYED  Tobacco Use   Smoking status: Former    Packs/day: 0.50    Years: 10.00    Pack years: 5.00    Types: Cigarettes    Quit date: 07/10/2011    Years since quitting: 10.2   Smokeless tobacco: Never   Tobacco comments:    Quit several yrs prior to 03/2013.  Vaping Use   Vaping Use: Never used  Substance and Sexual Activity   Alcohol use: Yes    Alcohol/week: 0.0 standard drinks    Comment: Occasional   Drug use: No   Sexual activity: Not on file  Other Topics Concern   Not on file  Social History Narrative   Not on file   Social Determinants of Health   Financial Resource Strain: Not on file  Food Insecurity: Not on file  Transportation Needs: Not on file  Physical Activity: Not on file  Stress: Not on file  Social Connections: Not on file  Intimate Partner Violence: Not on file    FAMILY HISTORY:  Family History  Problem Relation Age of Onset   COPD Father     CURRENT MEDICATIONS:  Current Outpatient Medications  Medication Sig Dispense Refill   aspirin EC 81 MG tablet Take 81 mg by mouth daily.     buPROPion (WELLBUTRIN XL) 300 MG 24 hr tablet Take 300 mg by mouth daily.     CISPLATIN IV Inject into the vein once a week. Day 1, Day 8 q 21 days     Durvalumab (IMFINZI IV) Inject into the vein every 21 ( twenty-one) days.     eszopiclone (LUNESTA) 2 MG TABS tablet Take 2 mg by mouth at bedtime. Take immediately before bedtime     fenofibrate 160 MG tablet Take 160 mg by mouth daily.     FLUoxetine (PROZAC) 10 MG tablet Take 10 mg by mouth daily.     gabapentin (NEURONTIN) 300 MG capsule Take 300 mg by mouth 3 (three) times daily.     Gemcitabine HCl (GEMZAR IV) Inject into the vein once a week. Day 1, Day 8 q 21 days     Lactulose 20 GM/30ML SOLN Take 30 mLs (20 g total) by mouth daily. 450 mL 6   lidocaine-prilocaine (EMLA) cream Apply a small amount to port a cath site and cover with  plastic wrap 1 hour prior to infusion appointments 30 g 3   metFORMIN (GLUCOPHAGE-XR) 500 MG 24 hr tablet Take 500 mg by mouth 2 (two) times daily.     olmesartan (BENICAR) 20 MG tablet Take 20 mg by mouth daily.     omeprazole (PRILOSEC) 40 MG capsule Take 40 mg by mouth daily.     pramipexole (MIRAPEX) 0.5 MG tablet Take 0.5 mg by mouth 3 (three) times daily.      RYBELSUS 7 MG TABS Take 7 mg by mouth daily.     tamsulosin (FLOMAX) 0.4 MG CAPS capsule Take 0.4 mg by mouth daily.     cyclobenzaprine (FLEXERIL) 10 MG tablet Take 10 mg by  mouth at bedtime as needed for muscle spasms. (Patient not taking: Reported on 10/05/2021)     diclofenac (VOLTAREN) 75 MG EC tablet Take 75 mg by mouth 2 (two) times daily as needed for moderate pain. (Patient not taking: Reported on 10/05/2021)     nitroGLYCERIN (NITROSTAT) 0.4 MG SL tablet Place 1 tablet (0.4 mg total) under the tongue every 5 (five) minutes as needed for chest pain. (Patient not taking: Reported on 10/05/2021) 25 tablet 3   prochlorperazine (COMPAZINE) 10 MG tablet Take 1 tablet (10 mg total) by mouth every 6 (six) hours as needed (Nausea or vomiting). (Patient not taking: Reported on 10/05/2021) 30 tablet 1   traMADol (ULTRAM) 50 MG tablet Take 50 mg by mouth daily as needed for moderate pain.  (Patient not taking: Reported on 10/05/2021)     No current facility-administered medications for this visit.    ALLERGIES:  No Known Allergies  PHYSICAL EXAM:  Performance status (ECOG): 0 - Asymptomatic  There were no vitals filed for this visit. Wt Readings from Last 3 Encounters:  10/05/21 258 lb 3.2 oz (117.1 kg)  09/28/21 260 lb (117.9 kg)  09/26/21 266 lb (120.7 kg)   Physical Exam Vitals reviewed.  Constitutional:      Appearance: Normal appearance. He is obese.  Cardiovascular:     Rate and Rhythm: Normal rate and regular rhythm.     Pulses: Normal pulses.     Heart sounds: Normal heart sounds.  Pulmonary:     Effort: Pulmonary  effort is normal.     Breath sounds: Normal breath sounds.  Abdominal:     Palpations: Abdomen is soft. There is no hepatomegaly, splenomegaly or mass.     Tenderness: There is no abdominal tenderness.  Musculoskeletal:     Right lower leg: No edema.     Left lower leg: No edema.  Neurological:     General: No focal deficit present.     Mental Status: He is alert and oriented to person, place, and time.  Psychiatric:        Mood and Affect: Mood normal.        Behavior: Behavior normal.    LABORATORY DATA:  I have reviewed the labs as listed.  CBC Latest Ref Rng & Units 10/05/2021 09/15/2021 09/01/2021  WBC 4.0 - 10.5 K/uL 7.8 8.9 7.8  Hemoglobin 13.0 - 17.0 g/dL 14.2 15.5 15.7  Hematocrit 39.0 - 52.0 % 46.4 49.2 50.0  Platelets 150 - 400 K/uL 553(H) 516(H) 452(H)   CMP Latest Ref Rng & Units 10/05/2021 09/01/2021 07/22/2014  Glucose 70 - 99 mg/dL 109(H) 110(H) -  BUN 8 - 23 mg/dL 30(H) 19 -  Creatinine 0.61 - 1.24 mg/dL 1.01 0.92 -  Sodium 135 - 145 mmol/L 132(L) 138 -  Potassium 3.5 - 5.1 mmol/L 4.5 4.2 -  Chloride 98 - 111 mmol/L 98 106 -  CO2 22 - 32 mmol/L 26 23 -  Calcium 8.9 - 10.3 mg/dL 9.2 9.8 -  Total Protein 6.5 - 8.1 g/dL 6.1(L) 7.3 6.1  Total Bilirubin 0.3 - 1.2 mg/dL 0.4 0.3 0.9  Alkaline Phos 38 - 126 U/L 76 58 58  AST 15 - 41 U/L 32 31 21  ALT 0 - 44 U/L '23 28 27    ' DIAGNOSTIC IMAGING:  I have independently reviewed the scans and discussed with the patient. MR Brain W Wo Contrast  Result Date: 09/21/2021 CLINICAL DATA:  Falls asleep unexpectedly, evaluate for metastatic disease EXAM: MRI HEAD  WITHOUT AND WITH CONTRAST TECHNIQUE: Multiplanar, multiecho pulse sequences of the brain and surrounding structures were obtained without and with intravenous contrast. CONTRAST:  58m GADAVIST GADOBUTROL 1 MMOL/ML IV SOLN COMPARISON:  CT head 02/24/2013 FINDINGS: Brain: There is no evidence of acute intracranial hemorrhage, extra-axial fluid collection, or acute infarct.  Parenchymal volume is normal. The ventricles are normal in size. There are a few scattered small foci of nonenhancing FLAIR signal abnormality in the subcortical and periventricular white matter and pons, nonspecific but likely reflecting sequela of mild chronic white matter microangiopathy. There is no suspicious parenchymal signal abnormality. There is no abnormal enhancement. There is no mass lesion. There is no midline shift. Vascular: Normal flow voids. Skull and upper cervical spine: Normal marrow signal. Sinuses/Orbits: There are small retention cysts in the maxillary sinuses. The globes and orbits are unremarkable. Other: None. IMPRESSION: No acute intracranial pathology or evidence of intracranial metastatic disease. Electronically Signed   By: PValetta MoleM.D.   On: 09/21/2021 17:05   NM PET Image Initial (PI) Skull Base To Thigh (F-18 FDG)  Result Date: 09/09/2021 CLINICAL DATA:  Initial treatment strategy for history of bladder cancer. EXAM: NUCLEAR MEDICINE PET SKULL BASE TO THIGH TECHNIQUE: 13.392 mCi F-18 FDG was injected intravenously. Full-ring PET imaging was performed from the skull base to thigh after the radiotracer. CT data was obtained and used for attenuation correction and anatomic localization. Fasting blood glucose: 89 mg/dl COMPARISON:  CT abdomen and pelvis of August 29, 2021. FINDINGS: Mediastinal blood pool activity: SUV max 2.29 Liver activity: SUV max NA NECK: Mild asymmetry with respect to uptake at the LEFT base of tongue (image 29/3) maximum SUV 4.9 mild asymmetric fullness in this location of soft tissue. No hypermetabolic lymph nodes in the neck. Incidental CT findings: none CHEST: No hypermetabolic mediastinal or hilar nodes. No suspicious pulmonary nodules on the CT scan. Incidental CT findings: Aortic atherosclerosis and signs of coronary artery disease. Basilar atelectasis without effusion or consolidation. No suspicious pulmonary nodule. Top normal heart size.  ABDOMEN/PELVIS: FDG avid disease in the liver and peritoneum as well as upper abdominal lymph nodes. (Image 120/3) area measuring up to 6.5 x 5.4 cm in hepatic subsegment IV showing activity that extends beyond the liver margin to involve the gastric antrum adjacent with a maximum SUV of 19. Hepatic subsegment V (image 118/3) area measuring approximately 4.9 cm greatest axial dimension with a maximum SUV up to 16. Hepatic subsegment VIII low attenuation near the dome of the RIGHT hemi liver difficult to localize but with approximately 18 mm greatest axial dimension and a maximum SUV of 6.3. Is ascites along the liver margin. Nodularity along the liver margin. (Image 121/3) gastrohepatic lymph node measuring 16 mm short axis with a maximum SUV of 8.1. Diffuse peritoneal and omental nodularity. Thickening of mesenteric planes. FDG avid omental caking (image 156/3 is an example with infiltration of the omentum without measurable lesion, discrete lesion but with a maximum SUV of 8.0. Nodularity in small volume fluid extends along the LEFT and RIGHT pericolic gutter and into the pelvis. Small volume pelvic fluid with similar volume compared to recent imaging with some increased metabolic activity in the dependent aspect of pelvic fluid. Incidental CT findings: Cholelithiasis and sludge in the gallbladder. No acute upper abdominal process. Gallbladder is slightly more distended than on previous imaging. No signs of bowel obstruction. Colonic diverticulosis. SKELETON: No focal hypermetabolic activity to suggest skeletal metastasis. Incidental CT findings: None aside from RIGHT hip arthroplasty and  spinal degenerative changes. 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 Gastric antral involvement from peritoneal disease is contiguous with hepatic metastatic disease. Nodal involvement in the upper abdomen. Mild fullness of LEFT base of tongue and mildly asymmetric activity in this  area, consider correlation with direct visualization to exclude mucosal lesion as warranted. Increasing distension of the gallbladder with cholelithiasis. Correlate with any RIGHT upper quadrant symptoms with worsening and consider dedicated assessment with ultrasound as warranted. Stranding and nodularity throughout the peritoneum including about the gallbladder is related to peritoneal carcinomatosis. Electronically Signed   By: Zetta Bills M.D.   On: 09/09/2021 11:58   US BIOPSY (LIVER)  Result Date: 09/15/2021 INDICATION: 66 year old with a hepatic lesion, ascites and evidence of peritoneal disease. Tissue diagnosis is needed. EXAM: ULTRASOUND-GUIDED PARACENTESIS ULTRASOUND-GUIDED LIVER LESION BIOPSY MEDICATIONS: Moderate sedation ANESTHESIA/SEDATION: Moderate (conscious) sedation was employed during this procedure. A total of Versed 3.64m and fentanyl 150 mcg was administered intravenously at the order of the provider performing the procedure. Total intra-service moderate sedation time: 45 minutes. Patient's level of consciousness and vital signs were monitored continuously by radiology nurse throughout the procedure under the supervision of the provider performing the procedure. FLUOROSCOPY TIME:  None COMPLICATIONS: None immediate. PROCEDURE: Patient was complaining of abdominal distension. Ultrasound demonstrated perihepatic ascites. Patient was consented for ultrasound-guided paracentesis and the ultrasound-guided liver biopsy. Paracentesis was performed to treat the patient's abdominal distension, obtain cytology and decrease the risk of bleeding following the liver biopsy. Informed written consent was obtained from the patient after a thorough discussion of the procedural risks, benefits and alternatives. All questions were addressed. A timeout was performed prior to the initiation of the procedure. Ultrasound was used to identify the lesion in the left hepatic lobe and a small pocket of fluid in  the right lateral abdomen. The anterior abdomen and right lateral abdomen was prepped with chlorhexidine and sterile field was created. Skin along the right lateral abdomen was anesthetized with 1% lidocaine. Small incision was made. Using ultrasound guidance, a Safe-T-Centesis catheter was directed into the peritoneal fluid. Paracentesis was performed 1.5 L of amber colored fluid was removed. During the paracentesis, attention was directed to the left hepatic lesion. The skin along the anterior abdomen was anesthetized with 1% lidocaine and another small incision was made. Using ultrasound guidance, a 17 gauge coaxial needle was directed into the hypoechoic liver lesion. Three core biopsies were obtained with an 18 gauge core device. Specimens placed in formalin. Gel-Foam slurry was injected as the 17 gauge needle was removed. Paracentesis catheter was removed and bandages were placed. FINDINGS: Ascites was noted in the right lateral abdomen and perihepatic space. 1.5 L of fluid was removed. Fluid was sent for cytology. The medial segment of left hepatic lobe, segment 4, was very hypoechoic compared to the lateral segment. This corresponds with the tumor on the previous PET-CT. Biopsy needle was confirmed within the area of concern. Three adequate specimens were obtained. No immediate bleeding or hematoma formation. IMPRESSION: 1. Successful ultrasound-guided core biopsy of the left hepatic lesion. 2. Successful ultrasound-guided paracentesis. 1.5 L of fluid was removed and fluid was sent for cytology. Electronically Signed   By: AMarkus DaftM.D.   On: 09/15/2021 14:53   UKoreaParacentesis  Result Date: 09/15/2021 INDICATION: 66year old with a hepatic lesion, ascites and evidence of peritoneal disease. Tissue diagnosis is needed. EXAM: ULTRASOUND-GUIDED PARACENTESIS ULTRASOUND-GUIDED LIVER LESION BIOPSY MEDICATIONS: Moderate sedation ANESTHESIA/SEDATION: Moderate (conscious) sedation was employed during this  procedure.  A total of Versed 3.23m and fentanyl 150 mcg was administered intravenously at the order of the provider performing the procedure. Total intra-service moderate sedation time: 45 minutes. Patient's level of consciousness and vital signs were monitored continuously by radiology nurse throughout the procedure under the supervision of the provider performing the procedure. FLUOROSCOPY TIME:  None COMPLICATIONS: None immediate. PROCEDURE: Patient was complaining of abdominal distension. Ultrasound demonstrated perihepatic ascites. Patient was consented for ultrasound-guided paracentesis and the ultrasound-guided liver biopsy. Paracentesis was performed to treat the patient's abdominal distension, obtain cytology and decrease the risk of bleeding following the liver biopsy. Informed written consent was obtained from the patient after a thorough discussion of the procedural risks, benefits and alternatives. All questions were addressed. A timeout was performed prior to the initiation of the procedure. Ultrasound was used to identify the lesion in the left hepatic lobe and a small pocket of fluid in the right lateral abdomen. The anterior abdomen and right lateral abdomen was prepped with chlorhexidine and sterile field was created. Skin along the right lateral abdomen was anesthetized with 1% lidocaine. Small incision was made. Using ultrasound guidance, a Safe-T-Centesis catheter was directed into the peritoneal fluid. Paracentesis was performed 1.5 L of amber colored fluid was removed. During the paracentesis, attention was directed to the left hepatic lesion. The skin along the anterior abdomen was anesthetized with 1% lidocaine and another small incision was made. Using ultrasound guidance, a 17 gauge coaxial needle was directed into the hypoechoic liver lesion. Three core biopsies were obtained with an 18 gauge core device. Specimens placed in formalin. Gel-Foam slurry was injected as the 17 gauge needle  was removed. Paracentesis catheter was removed and bandages were placed. FINDINGS: Ascites was noted in the right lateral abdomen and perihepatic space. 1.5 L of fluid was removed. Fluid was sent for cytology. The medial segment of left hepatic lobe, segment 4, was very hypoechoic compared to the lateral segment. This corresponds with the tumor on the previous PET-CT. Biopsy needle was confirmed within the area of concern. Three adequate specimens were obtained. No immediate bleeding or hematoma formation. IMPRESSION: 1. Successful ultrasound-guided core biopsy of the left hepatic lesion. 2. Successful ultrasound-guided paracentesis. 1.5 L of fluid was removed and fluid was sent for cytology. Electronically Signed   By: AMarkus DaftM.D.   On: 09/15/2021 14:53   IR IMAGING GUIDED PORT INSERTION  Result Date: 09/29/2021 INDICATION: Cholangiocarcinoma. EXAM: IMPLANTED PORT A CATH PLACEMENT WITH ULTRASOUND AND FLUOROSCOPIC GUIDANCE MEDICATIONS: None ANESTHESIA/SEDATION: Moderate (conscious) sedation was employed during this procedure. A total of Versed 2 mg and Fentanyl 100 mcg was administered intravenously. Moderate Sedation Time: 24 minutes. The patient's level of consciousness and vital signs were monitored continuously by radiology nursing throughout the procedure under my direct supervision. FLUOROSCOPY TIME:  0 minutes, 18 seconds (3 mGy) COMPLICATIONS: None immediate. PROCEDURE: The procedure, risks, benefits, and alternatives were explained to the patient. Questions regarding the procedure were encouraged and answered. The patient understands and consents to the procedure. The neck and chest were prepped with chlorhexidine in a sterile fashion, and a sterile drape was applied covering the operative field. Maximum barrier sterile technique with sterile gowns and gloves were used for the procedure. A timeout was performed prior to the initiation of the procedure. Local anesthesia was provided with 1% lidocaine  with epinephrine. After creating a small venotomy incision, a micropuncture kit was utilized to access the internal jugular vein under direct, real-time ultrasound guidance. Ultrasound image documentation was performed.  The microwire was kinked to measure appropriate catheter length. A subcutaneous port pocket was then created along the upper chest wall utilizing a combination of sharp and blunt dissection. The pocket was irrigated with sterile saline. A single lumen ISP power injectable port was chosen for placement. The 8 Fr catheter was tunneled from the port pocket site to the venotomy incision. The port was placed in the pocket. The external catheter was trimmed to appropriate length. At the venotomy, an 8 Fr peel-away sheath was placed over a guidewire under fluoroscopic guidance. The catheter was then placed through the sheath and the sheath was removed. Final catheter positioning was confirmed and documented with a fluoroscopic spot radiograph. The port was accessed with a Huber needle, aspirated and flushed with heparinized saline. The port pocket incision was closed with interrupted 3-0 Vicryl suture then Dermabond was applied, including at the venotomy incision. Dressings were placed. The patient tolerated the procedure well without immediate post procedural complication. IMPRESSION: Successful placement of a RIGHT internal jugular approach power injectable Port-A-Cath, with tip located at the proximal RIGHT atrium. The catheter is ready for immediate use. Michaelle Birks, MD [facility] Electronically Signed   By: Michaelle Birks M.D.   On: 09/29/2021 11:01   IR Paracentesis  Result Date: 09/29/2021 INDICATION: Patient with a history of cholangiocarcinoma presents today with ascites. Interventional radiology asked to perform a therapeutic paracentesis. EXAM: ULTRASOUND GUIDED PARACENTESIS MEDICATIONS: 1% lidocaine 10 mL COMPLICATIONS: None  immediate. PROCEDURE: Informed written consent was obtained from the patient after a discussion of the risks, benefits and alternatives to treatment. A timeout was performed prior to the initiation of the procedure. Initial ultrasound scanning demonstrates a large amount of ascites within the LEFT lower abdominal quadrant. The LEFT lower abdomen was prepped and draped in the usual sterile fashion. 1% lidocaine was used for local anesthesia. Following this, a 19 gauge, 7-cm, Yueh catheter was introduced. An ultrasound image was saved for documentation purposes. The paracentesis was performed. The catheter was removed and a dressing was applied. The patient tolerated the procedure well without immediate post procedural complication. FINDINGS: A total of approximately 4.8 L of clear yellow fluid was removed. IMPRESSION: Successful ultrasound-guided therapeutic paracentesis yielding 4.8 liters of peritoneal fluid. Read by: Soyla Dryer, NP Electronically Signed   By: Michaelle Birks M.D.   On: 09/29/2021 09:04     ASSESSMENT:  Liver lesion/peritoneal carcinomatosis: - Patient seen at the request of Dr. Delphina Cahill - Reported pressure on the sides of the abdomen with slight pain for the last 1 month.  He also reported pain in the epigastric region. - He had lost 20 pounds in the last 3 to 4 months intentionally, cutting back on sugars.  He was also started on semaglutide. - Colonoscopy on 06/28/2011 with a benign polypoid colonic mucosa in the descending colon.  No evidence of malignancy. - MRI of the brain was negative. - EGD and colonoscopy on 09/23/2018 did not reveal any malignancies. 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  IHC positive for CK7, CDX2 and negative for GATA3.  Findings suggestive of upper GI or pancreaticobiliary primary. - Cycle 1 of gemcitabine, cisplatin and durvalumab started on 10/05/2021.   Social/family history: - He lives at home by himself.  He  records voiceover/narrations. - Non-smoker. - Father died of MDS.  Paternal grandfather had prostate cancer.  Maternal grandfather had cancer.  3.  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 Alinda Money.  Reportedly received 1 treatment of intravesical chemo and has been on surveillance since then.  Last surveillance visit was in 2020.   PLAN:  Cholangiocarcinoma with peritoneal carcinomatosis: - He complains of acid reflux.  He is already on omeprazole.  Recommend taking Maalox as needed.  He also has flatulence for which Gas-X was recommended. - Reviewed labs today which showed normal LFTs.  Albumin is 3.  CBC was normal. - We talked about chemotherapy with gemcitabine, cisplatin and durvalumab based on topaz 1 trial which showed improvement in overall survival. - We talked about side effects in detail. - He will proceed with cycle 1 without any dose modifications today. - RTC day 8 with labs and treatment.    Malignant ascites: - Last paracentesis on 09/28/2021 with 4.8 L removed. - He reports some fullness.  We will arrange for another paracentesis on Friday and then weekly.  3.  Abdominal pain: - Continue tramadol 50 mg 1-2 times daily. - If the pain gets worse, will consider changing to hydrocodone.  4.  Constipation: - Continue Colace daily.  Continue lactulose as needed.   Orders placed this encounter:  No orders of the defined types were placed in this encounter.    Derek Jack, MD [place] [phone]   I, Thana Ates, am acting as a scribe for Dr. Derek Jack.  I, Derek Jack MD, have reviewed the above documentation for accuracy and completeness, and I agree with the above.

## 2021-10-05 NOTE — Patient Instructions (Signed)
[facility]  Discharge Instructions: Thank you for choosing [facility] to provide your oncology and hematology care.  If you have a lab appointment with the [facility], please come in thru the Main Entrance and check in at the main information desk.  Wear comfortable clothing and clothing appropriate for easy access to any Portacath or PICC line.   We strive to give you quality time with your provider. You may need to reschedule your appointment if you arrive late (15 or more minutes).  Arriving late affects you and other patients whose appointments are after yours.  Also, if you miss three or more appointments without notifying the office, you may be dismissed from the clinic at the providers discretion.      For prescription refill requests, have your pharmacy contact our office and allow 72 hours for refills to be completed.    Today you received the following chemotherapy and/or immunotherapy agents Cisplatin/Gemzar/Imfinzi.       To help prevent nausea and vomiting after your treatment, we encourage you to take your nausea medication as directed.  BELOW ARE SYMPTOMS THAT SHOULD BE REPORTED IMMEDIATELY: *FEVER GREATER THAN 100.4 F (38 C) OR HIGHER *CHILLS OR SWEATING *NAUSEA AND VOMITING THAT IS NOT CONTROLLED WITH YOUR NAUSEA MEDICATION *UNUSUAL SHORTNESS OF BREATH *UNUSUAL BRUISING OR BLEEDING *URINARY PROBLEMS (pain or burning when urinating, or frequent urination) *BOWEL PROBLEMS (unusual diarrhea, constipation, pain near the anus) TENDERNESS IN MOUTH AND THROAT WITH OR WITHOUT PRESENCE OF ULCERS (sore throat, sores in mouth, or a toothache) UNUSUAL RASH, SWELLING OR PAIN  UNUSUAL VAGINAL DISCHARGE OR ITCHING   Items with * indicate a potential emergency and should be followed up as soon as possible or go to the Emergency Department if any problems should occur.  Please show the CHEMOTHERAPY ALERT CARD or IMMUNOTHERAPY ALERT CARD at check-in to the  Emergency Department and triage nurse.  Should you have questions after your visit or need to cancel or reschedule your appointment, please contact [facility] [phone]  and follow the prompts.  Office hours are 8:00 a.m. to 4:30 p.m. Monday - Friday. Please note that voicemails left after 4:00 p.m. may not be returned until the following business day.  We are closed weekends and major holidays. You have access to a nurse at all times for urgent questions. Please call the main number to the clinic [phone] and follow the prompts.  For any non-urgent questions, you may also contact your provider using MyChart. We now offer e-Visits for anyone 96 and older to request care online for non-urgent symptoms. For details visit [email or web address].   Also download the MyChart app! Go to the app store, search "MyChart", open the app, select [facility], and log in with your MyChart username and password.  Due to Covid, a mask is required upon entering the hospital/clinic. If you do not have a mask, one will be given to you upon arrival. For doctor visits, patients may have 1 support person aged 6 or older with them. For treatment visits, patients cannot have anyone with them due to current Covid guidelines and our immunocompromised population.

## 2021-10-05 NOTE — Progress Notes (Signed)
Patient seen and examines, labs reviewed by Dr. Delton Coombes. Proceed with treatment today per Dr. Delton Coombes. Primary RN and Pharmacy made aware.

## 2021-10-05 NOTE — Patient Instructions (Signed)
[facility] at [facility] Discharge Instructions  You were seen and examined today by Dr. Delton Coombes. Proceed with your initial treatment today and return for your second treatment in one week.  Decrease your lactulose in the event of diarrhea. Add GasX as needed and Maalox or Mylanta as needed at bedtime.  We will arrange for you to have a Paracentesis on Friday.   Thank you for choosing [facility] at [facility] to provide your oncology and hematology care.  To afford each patient quality time with our provider, please arrive at least 15 minutes before your scheduled appointment time.   If you have a lab appointment with the [facility] please come in thru the Main Entrance and check in at the main information desk.  You need to re-schedule your appointment should you arrive 10 or more minutes late.  We strive to give you quality time with our providers, and arriving late affects you and other patients whose appointments are after yours.  Also, if you no show three or more times for appointments you may be dismissed from the clinic at the providers discretion.     Again, thank you for choosing [facility].  Our hope is that these requests will decrease the amount of time that you wait before being seen by our physicians.       _____________________________________________________________  Should you have questions after your visit to [facility], please contact our office at [phone] and follow the prompts.  Our office hours are 8:00 a.m. and 4:30 p.m. Monday - Friday.  Please note that voicemails left after 4:00 p.m. may not be returned until the following business day.  We are closed weekends and major holidays.  You do have access to a nurse 24-7, just call the main number to the clinic [phone] and do not press any options, hold on the line and a nurse will answer the phone.    For prescription refill  requests, have your pharmacy contact our office and allow 72 hours.    Due to Covid, you will need to wear a mask upon entering the hospital. If you do not have a mask, a mask will be given to you at the Main Entrance upon arrival. For doctor visits, patients may have 1 support person age 67 or older with them. For treatment visits, patients can not have anyone with them due to social distancing guidelines and our immunocompromised population.

## 2021-10-05 NOTE — Progress Notes (Signed)
Patient presents today for C1D1 treatment and follow up with Dr. Delton Coombes. Vital signs within parameters for today's treatment. Labs reviewed and parameters met for treatment today. MAR reviewed. Patient denies any significant changes since his last visit. Patient has complaints of abdominal pain left and ride sided that he rates a 6/10.   Message received from Sylvester Harder RN / Dr. Delton Coombes to proceed with treatment today. Patient seen by Dr. Delton Coombes. Labs reviewed.   Patient voided 200 mls.   Treatment given today per MD orders. Tolerated infusion without adverse affects. Vital signs stable. No complaints at this time. Discharged from clinic ambulatory in stable condition. Alert and oriented x 3. F/U with [facility] as scheduled.

## 2021-10-06 ENCOUNTER — Ambulatory Visit (INDEPENDENT_AMBULATORY_CARE_PROVIDER_SITE_OTHER): Payer: PRIVATE HEALTH INSURANCE | Admitting: Gastroenterology

## 2021-10-06 LAB — CEA: CEA: 64.8 ng/mL — ABNORMAL HIGH (ref 0.0–4.7)

## 2021-10-07 ENCOUNTER — Encounter (HOSPITAL_COMMUNITY): Payer: Self-pay

## 2021-10-07 ENCOUNTER — Ambulatory Visit (HOSPITAL_COMMUNITY): Payer: Medicare Other

## 2021-10-07 ENCOUNTER — Ambulatory Visit (HOSPITAL_COMMUNITY)
Admission: RE | Admit: 2021-10-07 | Discharge: 2021-10-07 | Disposition: A | Discharge status: Home / Self Care | Payer: Medicare Other | Source: Ambulatory Visit | Attending: Hematology | Admitting: Hematology

## 2021-10-07 ENCOUNTER — Other Ambulatory Visit: Payer: Self-pay

## 2021-10-07 DIAGNOSIS — C787 Secondary malignant neoplasm of liver and intrahepatic bile duct: Secondary | ICD-10-CM | POA: Diagnosis not present

## 2021-10-07 DIAGNOSIS — C221 Intrahepatic bile duct carcinoma: Secondary | ICD-10-CM | POA: Insufficient documentation

## 2021-10-07 NOTE — Progress Notes (Signed)
PT tolerated left sided paracentesis procedure well today and 3.9 Liters of dark yellow fluid removed. PT verbalized understanding of discharge instructions and ambulatory at discharge with NAD noted.

## 2021-10-07 NOTE — Procedures (Signed)
PreOperative Dx: Malignant ascites Postoperative Dx: Malignant ascites Procedure:   US guided paracentesis Radiologist:  Thornton Papas Anesthesia:  10 ml of1% lidocaine Specimen:  3.9 L of dark yellow ascitic fluid EBL:   < 1 ml Complications: none

## 2021-10-10 ENCOUNTER — Other Ambulatory Visit (HOSPITAL_COMMUNITY): Payer: Self-pay

## 2021-10-10 MED ORDER — PANTOPRAZOLE SODIUM 40 MG PO TBEC: 40.0000 mg | DELAYED_RELEASE_TABLET | Freq: Every day | ORAL | 3 refills | Status: DC

## 2021-10-10 NOTE — Telephone Encounter (Signed)
Patient called reporting indigestion. No relief from tums or maalox. Dr. Delton Coombes made aware, order received for protonix 40mg  once daily and maalox as needed. Patient made aware and verbalized understanding.

## 2021-10-11 NOTE — Progress Notes (Signed)
[address] [address], [place] [address] Phone: [phone] Fax: [place] PROGRESS NOTE   Ankith Edmonston [id number] 10/03/1955 66 y.o.  Gatsby Chismar is managed by Dr. 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  He received day 1 of cycle #1 of gemcitabine, cisplatin, and durvalumab on 10/05/2021.  He  reports that he  tolerated his  chemotherapy infusions fairly well.  He did have some significant fatigue from Friday through Sunday, but has been feeling better since Monday and now feels that his energy has returned to baseline.  He has continued to have issues with indigestion, currently taking both Protonix and Prilosec.  He had a single episode of nausea and vomiting yesterday, reports that his heartburn is better today.  He denies any diarrhea.  His constipation is better, continuing to take Colace daily and lactulose as needed.  He does report occasional clay colored stools.  He denies any mouth sores.  He has had ongoing dry mouth for the past year, worse in the mornings.  He reports generally poor appetite secondary to ascites.  He reports that he has improved appetite after paracentesis, but as fluid builds up in his abdomen, his appetite wanes.  He tries to drink 64 ounces of water daily, but admits that for the past 2 days he is only drank about 32 ounces.  Patient is requiring frequent  paracentesis due to his malignant ascites, with most recent paracentesis on 09/2721 with 3.9 L of fluid removed.  He is beginning to feel abdominal tightness again and has another paracentesis scheduled for this Friday, 10/14/2021.  He reports dry cough associated with abdominal fluid buildup that is relieved with paracentesis.  He has underlying soreness of his right upper quadrant abdomen, described as 4 out of 10 in intensity.  He has intermittent sharp pains in his upper abdomen up to a 9 out of 10 in intensity, no relief from tramadol.  He has some relief from diclofenac.  Reports that abdominal pain is worse in the evening and that his upper abdomen will feel flushed and warm to the touch.    He denies any hearing loss since starting cisplatin.  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 He denies any change in urine output apart from some symptoms of incomplete emptying. He denies any rash or skin changes.  No new neuropathy.  No new joint pain or headaches. No peripheral edema. He denies any chest pain, shortness of breath, or dyspnea on exertion. No bruising or bleeding events. Denies any B symptoms such as fever, chills, night sweats.  His weight fluctuates based on fluid buildup in his abdomen. He has tolerated his immunotherapy well, and denies any excessive diarrhea, rash, unusual dyspnea, or new cough.   Review of Systems:  Review of Systems  Constitutional:  Positive for appetite change and fatigue. Negative for activity change, chills, diaphoresis, fever and unexpected weight change.  HENT:  Negative for mouth sores, nosebleeds, sore throat and trouble swallowing.        Dry mouth  Respiratory:  Positive for cough. Negative for shortness of breath.   Cardiovascular:  Negative for chest pain, palpitations and leg swelling.  Gastrointestinal:  Positive for abdominal pain, constipation, nausea and vomiting. Negative for blood in stool and diarrhea.  Genitourinary:  Negative for  dysuria and hematuria.  Neurological:  Negative for dizziness, light-headedness, numbness and headaches.  Psychiatric/Behavioral:  Positive for sleep disturbance. Negative for dysphoric mood. The patient is not nervous/anxious.     Past Medical History, Surgical history, Social history, and Family history were reviewed as documented elsewhere in chart, and were updated as appropriate.   Assessment & Plan:    1.  Cholangiocarcinoma metastatic to the liver with peritoneal carcinomatosis - Primary oncologist is Dr. Delton Coombes - Patient is on chemotherapy protocol with gemcitabine + cisplatin + durvalumab, received day 1 cycle #1 on 10/05/2021 - He is tolerating chemotherapy fairly well - had some fatigue x3 days, but otherwise is feeling back to his normal self today with other complaints unrelated to chemotherapy noted below - He is tolerating immunotherapy well without any excessive diarrhea, rash, new dyspnea, or new cough - Labs reviewed (10/12/2021): Slightly increased creatinine 1.36 (was 1.01 last week), AST 55 with normal ALT and alk phos.  Hypoalbuminemia with albumin 2.5.  Mild hyponatremia 131.  Normal potassium and magnesium.  Mild normocytic anemia with Hgb 12.8/MCV 80.3. - PLAN: Discussed with Dr. Delton Coombes, we will proceed with day 8 of treatment today (gemcitabine + cisplatin).   - Follow-up with Dr. Delton Coombes as scheduled for repeat labs and cycle #2 of chemotherapy on 10/26/2021   2.  Malignant ascites - Last paracentesis was on 10/07/2021 with 3.9 L removed - He is feeling increasing abdominal distention and fullness.  He is scheduled for another paracentesis this Friday. - PLAN: Continue weekly paracentesis  3.  Abdominal pain + indigestion - Currently taking tramadol 50 mg 1-2 times daily, but with little pain relief.  He had some relief from diclofenac. - Has been taking both omeprazole and pantoprazole for indigestion, had some relief after an episode of nausea and  vomiting - PLAN: Patient instructed to discontinue diclofenac, as NSAIDs may be worsening his indigestion. - We will discontinue omeprazole, since it is a duplicate PPI.  Continue Protonix in the morning.  We have added famotidine in the evenings. - Tramadol discontinued, since it was not working. - Prescription sent for hydrocodone/acetaminophen 5/325 to be taken every 4 hours as needed for pain.  Patient instructed to call our office if he continues to have inadequately controlled pain.  4.  Constipation - Patient has been taking Colace daily with lactulose as needed. - Constipation is improved - PLAN: Continue daily Colace with lactulose as needed.  Stop lactulose and Colace if any diarrhea.  5.  Nutrition + hydration - Poor appetite due to ascites/abdominal fluid overload - Drinking 32 to 64 ounces of water daily - PLAN: Recommend drinking at least 64 ounce of water daily with 1-2 Ensure adult nutritional shakes to supplement oral intake.   PLAN SUMMARY & DISPOSITION: - Patient is aware that he can call nurse line or symptom management clinic if he has any new or worsening symptoms, or if he fails to improve on the above treatments - Otherwise, follow-up for next labs, treatment, and MD visit with Dr. Delton Coombes on 10/26/2021  Objective:   Physical Exam:  There were no vitals taken for this visit. ECOG: 1  Physical Exam Constitutional:      Appearance: Normal appearance. He is  obese.  HENT:     Head: Normocephalic and atraumatic.     Mouth/Throat:     Mouth: Mucous membranes are moist.  Eyes:     Extraocular Movements: Extraocular movements intact.     Pupils: Pupils are equal, round, and reactive to light.  Cardiovascular:     Rate and Rhythm: Normal rate and regular rhythm.     Pulses: Normal pulses.     Heart sounds: Normal heart sounds.  Pulmonary:     Effort: Pulmonary effort is normal.     Breath sounds: Normal breath sounds.  Abdominal:     General: Bowel sounds  are normal. There is distension.     Palpations: Abdomen is soft.     Tenderness: There is no abdominal tenderness.  Musculoskeletal:        General: No swelling.     Right lower leg: No edema.     Left lower leg: No edema.  Lymphadenopathy:     Cervical: No cervical adenopathy.  Skin:    General: Skin is warm and dry.  Neurological:     General: No focal deficit present.     Mental Status: He is alert and oriented to person, place, and time.  Psychiatric:        Mood and Affect: Mood normal.        Behavior: Behavior normal.    Lab Review:     Component Value Date/Time   NA 132 (L) 10/05/2021 0829   K 4.5 10/05/2021 0829   CL 98 10/05/2021 0829   CO2 26 10/05/2021 0829   GLUCOSE 109 (H) 10/05/2021 0829   BUN 30 (H) 10/05/2021 0829   CREATININE 1.01 10/05/2021 0829   CALCIUM 9.2 10/05/2021 0829   PROT 6.1 (L) 10/05/2021 0829   ALBUMIN 3.0 (L) 10/05/2021 0829   AST 32 10/05/2021 0829   ALT 23 10/05/2021 0829   ALKPHOS 76 10/05/2021 0829   BILITOT 0.4 10/05/2021 0829   GFRNONAA >60 10/05/2021 0829   GFRAA >90 06/11/2014 0558       Component Value Date/Time   WBC 7.8 10/05/2021 0829   RBC 5.74 10/05/2021 0829   HGB 14.2 10/05/2021 0829   HCT 46.4 10/05/2021 0829   PLT 553 (H) 10/05/2021 0829   MCV 80.8 10/05/2021 0829   MCH 24.7 (L) 10/05/2021 0829   MCHC 30.6 10/05/2021 0829   RDW 15.4 10/05/2021 0829   LYMPHSABS 0.7 10/05/2021 0829   MONOABS 0.9 10/05/2021 0829   EOSABS 0.2 10/05/2021 0829   BASOSABS 0.0 10/05/2021 0829   -------------------------------  Imaging from last 24 hours (if applicable):  Radiology interpretation: MR Brain W Wo Contrast  Result Date: 09/21/2021 CLINICAL DATA:  Falls asleep unexpectedly, evaluate for metastatic disease EXAM: MRI HEAD WITHOUT AND WITH CONTRAST TECHNIQUE: Multiplanar, multiecho pulse sequences of the brain and surrounding structures were obtained without and with intravenous contrast. CONTRAST:  36m GADAVIST  GADOBUTROL 1 MMOL/ML IV SOLN COMPARISON:  CT head 02/24/2013 FINDINGS: Brain: There is no evidence of acute intracranial hemorrhage, extra-axial fluid collection, or acute infarct. Parenchymal volume is normal. The ventricles are normal in size. There are a few scattered small foci of nonenhancing FLAIR signal abnormality in the subcortical and periventricular white matter and pons, nonspecific but likely reflecting sequela of mild chronic white matter microangiopathy. There is no suspicious parenchymal signal abnormality. There is no abnormal enhancement. There is no mass lesion. There is no midline shift. Vascular: Normal flow voids. Skull and upper cervical spine: Normal marrow  signal. Sinuses/Orbits: There are small retention cysts in the maxillary sinuses. The globes and orbits are unremarkable. Other: None. IMPRESSION: No acute intracranial pathology or evidence of intracranial metastatic disease. Electronically Signed   By: Valetta Mole M.D.   On: 09/21/2021 17:05   US BIOPSY (LIVER)  Result Date: 09/15/2021 INDICATION: 66 year old with a hepatic lesion, ascites and evidence of peritoneal disease. Tissue diagnosis is needed. EXAM: ULTRASOUND-GUIDED PARACENTESIS ULTRASOUND-GUIDED LIVER LESION BIOPSY MEDICATIONS: Moderate sedation ANESTHESIA/SEDATION: Moderate (conscious) sedation was employed during this procedure. A total of Versed 3.28m and fentanyl 150 mcg was administered intravenously at the order of the provider performing the procedure. Total intra-service moderate sedation time: 45 minutes. Patient's level of consciousness and vital signs were monitored continuously by radiology nurse throughout the procedure under the supervision of the provider performing the procedure. FLUOROSCOPY TIME:  None COMPLICATIONS: None immediate. PROCEDURE: Patient was complaining of abdominal distension. Ultrasound demonstrated perihepatic ascites. Patient was consented for ultrasound-guided paracentesis and the  ultrasound-guided liver biopsy. Paracentesis was performed to treat the patient's abdominal distension, obtain cytology and decrease the risk of bleeding following the liver biopsy. Informed written consent was obtained from the patient after a thorough discussion of the procedural risks, benefits and alternatives. All questions were addressed. A timeout was performed prior to the initiation of the procedure. Ultrasound was used to identify the lesion in the left hepatic lobe and a small pocket of fluid in the right lateral abdomen. The anterior abdomen and right lateral abdomen was prepped with chlorhexidine and sterile field was created. Skin along the right lateral abdomen was anesthetized with 1% lidocaine. Small incision was made. Using ultrasound guidance, a Safe-T-Centesis catheter was directed into the peritoneal fluid. Paracentesis was performed 1.5 L of amber colored fluid was removed. During the paracentesis, attention was directed to the left hepatic lesion. The skin along the anterior abdomen was anesthetized with 1% lidocaine and another small incision was made. Using ultrasound guidance, a 17 gauge coaxial needle was directed into the hypoechoic liver lesion. Three core biopsies were obtained with an 18 gauge core device. Specimens placed in formalin. Gel-Foam slurry was injected as the 17 gauge needle was removed. Paracentesis catheter was removed and bandages were placed. FINDINGS: Ascites was noted in the right lateral abdomen and perihepatic space. 1.5 L of fluid was removed. Fluid was sent for cytology. The medial segment of left hepatic lobe, segment 4, was very hypoechoic compared to the lateral segment. This corresponds with the tumor on the previous PET-CT. Biopsy needle was confirmed within the area of concern. Three adequate specimens were obtained. No immediate bleeding or hematoma formation. IMPRESSION: 1. Successful ultrasound-guided core biopsy of the left hepatic lesion. 2. Successful  ultrasound-guided paracentesis. 1.5 L of fluid was removed and fluid was sent for cytology. Electronically Signed   By: AMarkus DaftM.D.   On: 09/15/2021 14:53   UKoreaParacentesis  Result Date: 10/07/2021 INDICATION: Malignant ascites EXAM: ULTRASOUND GUIDED THERAPEUTIC PARACENTESIS MEDICATIONS: None. COMPLICATIONS: None immediate. PROCEDURE: Informed written consent was obtained from the patient after a discussion of the risks, benefits and alternatives to treatment. A timeout was performed prior to the initiation of the procedure. Initial ultrasound scanning demonstrates a large amount of ascites within the LEFT lower abdominal quadrant. The right lower abdomen was prepped and draped in the usual sterile fashion. 1% lidocaine was used for local anesthesia. Following this, a 5 French catheter was introduced. An ultrasound image was saved for documentation purposes. The paracentesis was performed. The catheter  was removed and a dressing was applied. The patient tolerated the procedure well without immediate post procedural complication. FINDINGS: A total of approximately 3.9 L of dark yellow ascitic fluid was removed. IMPRESSION: Successful ultrasound-guided paracentesis yielding 3.9 liters of peritoneal fluid. Electronically Signed   By: Lavonia Dana M.D.   On: 10/07/2021 10:21   US Paracentesis  Result Date: 09/15/2021 INDICATION: 66 year old with a hepatic lesion, ascites and evidence of peritoneal disease. Tissue diagnosis is needed. EXAM: ULTRASOUND-GUIDED PARACENTESIS ULTRASOUND-GUIDED LIVER LESION BIOPSY MEDICATIONS: Moderate sedation ANESTHESIA/SEDATION: Moderate (conscious) sedation was employed during this procedure. A total of Versed 3.34m and fentanyl 150 mcg was administered intravenously at the order of the provider performing the procedure. Total intra-service moderate sedation time: 45 minutes. Patient's level of consciousness and vital signs were monitored continuously by radiology nurse  throughout the procedure under the supervision of the provider performing the procedure. FLUOROSCOPY TIME:  None COMPLICATIONS: None immediate. PROCEDURE: Patient was complaining of abdominal distension. Ultrasound demonstrated perihepatic ascites. Patient was consented for ultrasound-guided paracentesis and the ultrasound-guided liver biopsy. Paracentesis was performed to treat the patient's abdominal distension, obtain cytology and decrease the risk of bleeding following the liver biopsy. Informed written consent was obtained from the patient after a thorough discussion of the procedural risks, benefits and alternatives. All questions were addressed. A timeout was performed prior to the initiation of the procedure. Ultrasound was used to identify the lesion in the left hepatic lobe and a small pocket of fluid in the right lateral abdomen. The anterior abdomen and right lateral abdomen was prepped with chlorhexidine and sterile field was created. Skin along the right lateral abdomen was anesthetized with 1% lidocaine. Small incision was made. Using ultrasound guidance, a Safe-T-Centesis catheter was directed into the peritoneal fluid. Paracentesis was performed 1.5 L of amber colored fluid was removed. During the paracentesis, attention was directed to the left hepatic lesion. The skin along the anterior abdomen was anesthetized with 1% lidocaine and another small incision was made. Using ultrasound guidance, a 17 gauge coaxial needle was directed into the hypoechoic liver lesion. Three core biopsies were obtained with an 18 gauge core device. Specimens placed in formalin. Gel-Foam slurry was injected as the 17 gauge needle was removed. Paracentesis catheter was removed and bandages were placed. FINDINGS: Ascites was noted in the right lateral abdomen and perihepatic space. 1.5 L of fluid was removed. Fluid was sent for cytology. The medial segment of left hepatic lobe, segment 4, was very hypoechoic compared to  the lateral segment. This corresponds with the tumor on the previous PET-CT. Biopsy needle was confirmed within the area of concern. Three adequate specimens were obtained. No immediate bleeding or hematoma formation. IMPRESSION: 1. Successful ultrasound-guided core biopsy of the left hepatic lesion. 2. Successful ultrasound-guided paracentesis. 1.5 L of fluid was removed and fluid was sent for cytology. Electronically Signed   By: AMarkus DaftM.D.   On: 09/15/2021 14:53   IR IMAGING GUIDED PORT INSERTION  Result Date: 09/29/2021 INDICATION: Cholangiocarcinoma. EXAM: IMPLANTED PORT A CATH PLACEMENT WITH ULTRASOUND AND FLUOROSCOPIC GUIDANCE MEDICATIONS: None ANESTHESIA/SEDATION: Moderate (conscious) sedation was employed during this procedure. A total of Versed 2 mg and Fentanyl 100 mcg was administered intravenously. Moderate Sedation Time: 24 minutes. The patient's level of consciousness and vital signs were monitored continuously by radiology nursing throughout the procedure under my direct supervision. FLUOROSCOPY TIME:  0 minutes, 18 seconds (3 mGy) COMPLICATIONS: None immediate. PROCEDURE: The procedure, risks, benefits, and alternatives were explained to the patient.  Questions regarding the procedure were encouraged and answered. The patient understands and consents to the procedure. The neck and chest were prepped with chlorhexidine in a sterile fashion, and a sterile drape was applied covering the operative field. Maximum barrier sterile technique with sterile gowns and gloves were used for the procedure. A timeout was performed prior to the initiation of the procedure. Local anesthesia was provided with 1% lidocaine with epinephrine. After creating a small venotomy incision, a micropuncture kit was utilized to access the internal jugular vein under direct, real-time ultrasound guidance. Ultrasound image documentation was performed. The microwire was kinked to measure appropriate catheter length. A  subcutaneous port pocket was then created along the upper chest wall utilizing a combination of sharp and blunt dissection. The pocket was irrigated with sterile saline. A single lumen ISP power injectable port was chosen for placement. The 8 Fr catheter was tunneled from the port pocket site to the venotomy incision. The port was placed in the pocket. The external catheter was trimmed to appropriate length. At the venotomy, an 8 Fr peel-away sheath was placed over a guidewire under fluoroscopic guidance. The catheter was then placed through the sheath and the sheath was removed. Final catheter positioning was confirmed and documented with a fluoroscopic spot radiograph. The port was accessed with a Huber needle, aspirated and flushed with heparinized saline. The port pocket incision was closed with interrupted 3-0 Vicryl suture then Dermabond was applied, including at the venotomy incision. Dressings were placed. The patient tolerated the procedure well without immediate post procedural complication. IMPRESSION: Successful placement of a RIGHT internal jugular approach power injectable Port-A-Cath, with tip located at the proximal RIGHT atrium. The catheter is ready for immediate use. Michaelle Birks, MD [facility] Electronically Signed   By: Michaelle Birks M.D.   On: 09/29/2021 11:01   IR Paracentesis  Result Date: 09/29/2021 INDICATION: Patient with a history of cholangiocarcinoma presents today with ascites. Interventional radiology asked to perform a therapeutic paracentesis. EXAM: ULTRASOUND GUIDED PARACENTESIS MEDICATIONS: 1% lidocaine 10 mL COMPLICATIONS: None immediate. PROCEDURE: Informed written consent was obtained from the patient after a discussion of the risks, benefits and alternatives to treatment. A timeout was performed prior to the initiation of the procedure. Initial ultrasound scanning demonstrates a large amount of ascites within the  LEFT lower abdominal quadrant. The LEFT lower abdomen was prepped and draped in the usual sterile fashion. 1% lidocaine was used for local anesthesia. Following this, a 19 gauge, 7-cm, Yueh catheter was introduced. An ultrasound image was saved for documentation purposes. The paracentesis was performed. The catheter was removed and a dressing was applied. The patient tolerated the procedure well without immediate post procedural complication. FINDINGS: A total of approximately 4.8 L of clear yellow fluid was removed. IMPRESSION: Successful ultrasound-guided therapeutic paracentesis yielding 4.8 liters of peritoneal fluid. Read by: Soyla Dryer, NP Electronically Signed   By: Michaelle Birks M.D.   On: 09/29/2021 09:04      Wrap-Up:    PLAN SUMMARY & DISPOSITION: - Patient is aware that he can call nurse line or symptom management clinic if he has any new or worsening symptoms, or if he fails to improve on the above treatments - Otherwise, follow-up for next labs, treatment, and MD visit with Dr. Delton Coombes on 10/26/2021  All questions were answered. The patient knows to call the clinic with any problems, questions or concerns.  Medical decision making: Moderate  Time spent on visit: I spent 25 minutes  counseling the patient face to face. The total time spent in the appointment was 40 minutes and more than 50% was on counseling.   Harriett Rush, PA-C  10/12/21 2:48 PM

## 2021-10-12 ENCOUNTER — Inpatient Hospital Stay (HOSPITAL_BASED_OUTPATIENT_CLINIC_OR_DEPARTMENT_OTHER): Payer: Medicare Other | Admitting: Physician Assistant

## 2021-10-12 ENCOUNTER — Other Ambulatory Visit: Payer: Self-pay

## 2021-10-12 ENCOUNTER — Inpatient Hospital Stay (HOSPITAL_COMMUNITY): Payer: Medicare Other

## 2021-10-12 ENCOUNTER — Encounter (HOSPITAL_COMMUNITY): Payer: Self-pay | Admitting: Physician Assistant

## 2021-10-12 ENCOUNTER — Inpatient Hospital Stay (HOSPITAL_COMMUNITY): Payer: Medicare Other | Attending: Hematology

## 2021-10-12 VITALS — BP 109/75 | HR 87 | Temp 97.3°F | Resp 18

## 2021-10-12 VITALS — BP 122/82 | HR 88 | Temp 96.7°F | Resp 18 | Ht 71.25 in | Wt 261.9 lb

## 2021-10-12 DIAGNOSIS — C786 Secondary malignant neoplasm of retroperitoneum and peritoneum: Secondary | ICD-10-CM | POA: Insufficient documentation

## 2021-10-12 DIAGNOSIS — K30 Functional dyspepsia: Secondary | ICD-10-CM

## 2021-10-12 DIAGNOSIS — Z809 Family history of malignant neoplasm, unspecified: Secondary | ICD-10-CM | POA: Insufficient documentation

## 2021-10-12 DIAGNOSIS — Z8551 Personal history of malignant neoplasm of bladder: Secondary | ICD-10-CM | POA: Insufficient documentation

## 2021-10-12 DIAGNOSIS — Z87891 Personal history of nicotine dependence: Secondary | ICD-10-CM | POA: Insufficient documentation

## 2021-10-12 DIAGNOSIS — Z5111 Encounter for antineoplastic chemotherapy: Secondary | ICD-10-CM | POA: Diagnosis not present

## 2021-10-12 DIAGNOSIS — Z79899 Other long term (current) drug therapy: Secondary | ICD-10-CM | POA: Insufficient documentation

## 2021-10-12 DIAGNOSIS — Z8042 Family history of malignant neoplasm of prostate: Secondary | ICD-10-CM | POA: Diagnosis not present

## 2021-10-12 DIAGNOSIS — C221 Intrahepatic bile duct carcinoma: Secondary | ICD-10-CM

## 2021-10-12 DIAGNOSIS — Z95828 Presence of other vascular implants and grafts: Secondary | ICD-10-CM

## 2021-10-12 DIAGNOSIS — G893 Neoplasm related pain (acute) (chronic): Secondary | ICD-10-CM

## 2021-10-12 DIAGNOSIS — R180 Malignant ascites: Secondary | ICD-10-CM | POA: Insufficient documentation

## 2021-10-12 DIAGNOSIS — Z808 Family history of malignant neoplasm of other organs or systems: Secondary | ICD-10-CM | POA: Diagnosis not present

## 2021-10-12 DIAGNOSIS — Z5112 Encounter for antineoplastic immunotherapy: Secondary | ICD-10-CM | POA: Insufficient documentation

## 2021-10-12 DIAGNOSIS — C787 Secondary malignant neoplasm of liver and intrahepatic bile duct: Secondary | ICD-10-CM

## 2021-10-12 DIAGNOSIS — K5900 Constipation, unspecified: Secondary | ICD-10-CM | POA: Diagnosis not present

## 2021-10-12 DIAGNOSIS — Z09 Encounter for follow-up examination after completed treatment for conditions other than malignant neoplasm: Secondary | ICD-10-CM

## 2021-10-12 LAB — CBC WITH DIFFERENTIAL/PLATELET
Abs Immature Granulocytes: 0.02 10*3/uL (ref 0.00–0.07)
Basophils Absolute: 0 10*3/uL (ref 0.0–0.1)
Basophils Relative: 1 %
Eosinophils Absolute: 0.1 10*3/uL (ref 0.0–0.5)
Eosinophils Relative: 1 %
HCT: 42.5 % (ref 39.0–52.0)
Hemoglobin: 12.8 g/dL — ABNORMAL LOW (ref 13.0–17.0)
Immature Granulocytes: 0 %
Lymphocytes Relative: 12 %
Lymphs Abs: 0.6 10*3/uL — ABNORMAL LOW (ref 0.7–4.0)
MCH: 24.2 pg — ABNORMAL LOW (ref 26.0–34.0)
MCHC: 30.1 g/dL (ref 30.0–36.0)
MCV: 80.3 fL (ref 80.0–100.0)
Monocytes Absolute: 0.9 10*3/uL (ref 0.1–1.0)
Monocytes Relative: 17 %
Neutro Abs: 3.5 10*3/uL (ref 1.7–7.7)
Neutrophils Relative %: 69 %
Platelets: 353 10*3/uL (ref 150–400)
RBC: 5.29 MIL/uL (ref 4.22–5.81)
RDW: 15.2 % (ref 11.5–15.5)
WBC: 5.1 10*3/uL (ref 4.0–10.5)
nRBC: 0 % (ref 0.0–0.2)

## 2021-10-12 LAB — COMPREHENSIVE METABOLIC PANEL
ALT: 40 U/L (ref 0–44)
AST: 55 U/L — ABNORMAL HIGH (ref 15–41)
Albumin: 2.5 g/dL — ABNORMAL LOW (ref 3.5–5.0)
Alkaline Phosphatase: 66 U/L (ref 38–126)
Anion gap: 7 (ref 5–15)
BUN: 28 mg/dL — ABNORMAL HIGH (ref 8–23)
CO2: 25 mmol/L (ref 22–32)
Calcium: 8.6 mg/dL — ABNORMAL LOW (ref 8.9–10.3)
Chloride: 99 mmol/L (ref 98–111)
Creatinine, Ser: 1.36 mg/dL — ABNORMAL HIGH (ref 0.61–1.24)
GFR, Estimated: 57 mL/min — ABNORMAL LOW (ref >60)
Glucose, Bld: 108 mg/dL — ABNORMAL HIGH (ref 70–99)
Potassium: 4.4 mmol/L (ref 3.5–5.1)
Sodium: 131 mmol/L — ABNORMAL LOW (ref 135–145)
Total Bilirubin: 0.3 mg/dL (ref 0.3–1.2)
Total Protein: 5.9 g/dL — ABNORMAL LOW (ref 6.5–8.1)

## 2021-10-12 LAB — MAGNESIUM: Magnesium: 1.9 mg/dL (ref 1.7–2.4)

## 2021-10-12 MED ORDER — MAGNESIUM SULFATE 2 GM/50ML IV SOLN
2.0000 g | Freq: Once | INTRAVENOUS | Status: AC
  Administered 2021-10-12: 2 g via INTRAVENOUS
  Filled 2021-10-12: qty 50

## 2021-10-12 MED ORDER — HEPARIN SOD (PORK) LOCK FLUSH 100 UNIT/ML IV SOLN
500.0000 [IU] | Freq: Once | INTRAVENOUS | Status: AC | PRN
  Administered 2021-10-12: 500 [IU]

## 2021-10-12 MED ORDER — SODIUM CHLORIDE 0.9 % IV SOLN
25.0000 mg/m2 | Freq: Once | INTRAVENOUS | Status: AC
  Administered 2021-10-12: 62 mg via INTRAVENOUS
  Filled 2021-10-12: qty 62

## 2021-10-12 MED ORDER — SODIUM CHLORIDE 0.9 % IV SOLN
1000.0000 mg/m2 | Freq: Once | INTRAVENOUS | Status: AC
  Administered 2021-10-12: 2470 mg via INTRAVENOUS
  Filled 2021-10-12: qty 52.6

## 2021-10-12 MED ORDER — FAMOTIDINE 20 MG PO TABS: 20.0000 mg | ORAL_TABLET | Freq: Every day | ORAL | 3 refills | Status: DC

## 2021-10-12 MED ORDER — PALONOSETRON HCL INJECTION 0.25 MG/5ML
0.2500 mg | Freq: Once | INTRAVENOUS | Status: AC
  Administered 2021-10-12: 0.25 mg via INTRAVENOUS
  Filled 2021-10-12: qty 5

## 2021-10-12 MED ORDER — SODIUM CHLORIDE 0.9 % IV SOLN
150.0000 mg | Freq: Once | INTRAVENOUS | Status: AC
  Administered 2021-10-12: 150 mg via INTRAVENOUS
  Filled 2021-10-12: qty 150

## 2021-10-12 MED ORDER — SODIUM CHLORIDE 0.9 % IV SOLN
10.0000 mg | Freq: Once | INTRAVENOUS | Status: AC
  Administered 2021-10-12: 10 mg via INTRAVENOUS
  Filled 2021-10-12: qty 10

## 2021-10-12 MED ORDER — SODIUM CHLORIDE 0.9 % IV SOLN: Freq: Once | INTRAVENOUS | Status: AC

## 2021-10-12 MED ORDER — HYDROCODONE-ACETAMINOPHEN 5-325 MG PO TABS: 1.0000 | ORAL_TABLET | ORAL | 0 refills | Status: DC | PRN

## 2021-10-12 MED ORDER — SODIUM CHLORIDE 0.9% FLUSH
10.0000 mL | INTRAVENOUS | Status: DC | PRN
  Administered 2021-10-12: 10 mL

## 2021-10-12 MED ORDER — POTASSIUM CHLORIDE IN NACL 20-0.9 MEQ/L-% IV SOLN
Freq: Once | INTRAVENOUS | Status: AC
  Filled 2021-10-12: qty 1000

## 2021-10-12 NOTE — Patient Instructions (Signed)
[place] at [facility] Discharge Instructions  You were seen today by Tarri Abernethy PA-C to follow-up on chemotherapy side effects.  INDIGESTION: - You can STOP omeprazole (Prilosec), since this is a duplicate medication of pantoprazole. - CONTINUE pantoprazole (Protonix) 40 mg every morning before breakfast. - START taking famotidine (Pepcid) every evening. - Continue nausea medication as needed. - You should STOP taking your DICLOFENAC, since this can worsen symptoms of indigestion and stomach irritation.  ABDOMINAL PAIN: Since tramadol is not working, we will discontinue tramadol and will write new prescription for hydrocodone/acetaminophen, which you can take every 4-6 hours as needed for pain. - Continue paracentesis as needed weekly.  CONSTIPATION: Continue daily Colace and lactulose as needed.  Do not take any stool softener or laxative if you are having diarrhea.  NUTRITION & HYDRATION: Continue to drink 64 ounce of water each day.  Recommend drinking 1-2 Ensure adult nutrition shakes daily as well.  FOLLOW-UP APPOINTMENT: You are scheduled for your next labs, treatment, and visit with Dr. Delton Coombes on 10/26/2021.   Thank you for choosing [place] at [facility] to provide your oncology and hematology care.  To afford each patient quality time with our provider, please arrive at least 15 minutes before your scheduled appointment time.   If you have a lab appointment with the [facility] please come in thru the Main Entrance and check in at the main information desk.  You need to re-schedule your appointment should you arrive 10 or more minutes late.  We strive to give you quality time with our providers, and arriving late affects you and other patients whose appointments are after yours.  Also, if you no show three or more times for appointments you may be dismissed from the clinic at the providers discretion.     Again,  thank you for choosing [facility].  Our hope is that these requests will decrease the amount of time that you wait before being seen by our physicians.       _____________________________________________________________  Should you have questions after your visit to [facility], please contact our office at [phone] and follow the prompts.  Our office hours are 8:00 a.m. and 4:30 p.m. Monday - Friday.  Please note that voicemails left after 4:00 p.m. may not be returned until the following business day.  We are closed weekends and major holidays.  You do have access to a nurse 24-7, just call the main number to the clinic [phone] and do not press any options, hold on the line and a nurse will answer the phone.    For prescription refill requests, have your pharmacy contact our office and allow 72 hours.    Due to Covid, you will need to wear a mask upon entering the hospital. If you do not have a mask, a mask will be given to you at the Main Entrance upon arrival. For doctor visits, patients may have 1 support person age 55 or older with them. For treatment visits, patients can not have anyone with them due to social distancing guidelines and our immunocompromised population.

## 2021-10-12 NOTE — Patient Instructions (Signed)
[place]  Discharge Instructions: Thank you for choosing [facility] to provide your oncology and hematology care.  If you have a lab appointment with the [facility], please come in thru the Main Entrance and check in at the main information desk.  Wear comfortable clothing and clothing appropriate for easy access to any Portacath or PICC line.   We strive to give you quality time with your provider. You may need to reschedule your appointment if you arrive late (15 or more minutes).  Arriving late affects you and other patients whose appointments are after yours.  Also, if you miss three or more appointments without notifying the office, you may be dismissed from the clinic at the providers discretion.      For prescription refill requests, have your pharmacy contact our office and allow 72 hours for refills to be completed.    Today you received the following chemotherapy and/or immunotherapy agents Cisplatin and Gemzar      To help prevent nausea and vomiting after your treatment, we encourage you to take your nausea medication as directed.  BELOW ARE SYMPTOMS THAT SHOULD BE REPORTED IMMEDIATELY: *FEVER GREATER THAN 100.4 F (38 C) OR HIGHER *CHILLS OR SWEATING *NAUSEA AND VOMITING THAT IS NOT CONTROLLED WITH YOUR NAUSEA MEDICATION *UNUSUAL SHORTNESS OF BREATH *UNUSUAL BRUISING OR BLEEDING *URINARY PROBLEMS (pain or burning when urinating, or frequent urination) *BOWEL PROBLEMS (unusual diarrhea, constipation, pain near the anus) TENDERNESS IN MOUTH AND THROAT WITH OR WITHOUT PRESENCE OF ULCERS (sore throat, sores in mouth, or a toothache) UNUSUAL RASH, SWELLING OR PAIN  UNUSUAL VAGINAL DISCHARGE OR ITCHING   Items with * indicate a potential emergency and should be followed up as soon as possible or go to the Emergency Department if any problems should occur.  Please show the CHEMOTHERAPY ALERT CARD or IMMUNOTHERAPY ALERT CARD at check-in to the  Emergency Department and triage nurse.  Should you have questions after your visit or need to cancel or reschedule your appointment, please contact [facility] [phone]  and follow the prompts.  Office hours are 8:00 a.m. to 4:30 p.m. Monday - Friday. Please note that voicemails left after 4:00 p.m. may not be returned until the following business day.  We are closed weekends and major holidays. You have access to a nurse at all times for urgent questions. Please call the main number to the clinic [phone] and follow the prompts.  For any non-urgent questions, you may also contact your provider using MyChart. We now offer e-Visits for anyone 74 and older to request care online for non-urgent symptoms. For details visit [email or web address].   Also download the MyChart app! Go to the app store, search "MyChart", open the app, select [place], and log in with your MyChart username and password.  Due to Covid, a mask is required upon entering the hospital/clinic. If you do not have a mask, one will be given to you upon arrival. For doctor visits, patients may have 1 support person aged 79 or older with them. For treatment visits, patients cannot have anyone with them due to current Covid guidelines and our immunocompromised population.

## 2021-10-12 NOTE — Progress Notes (Signed)
Patient presents today for Cisplatin and Gemzar infusions per providers order.  Vital signs within parameters for treatment.  Labs pending.  Patients only complaint is that he had some nausea and vomiting associated with reflux 10/11/21.  Labs within parameters for treatment.  Patient voided 200 cc.  Cisplatin and Gemzar infusions given today per MD orders.  Stable during infusion without adverse affects.  Vital signs stable.  No complaints at this time.  Discharge from clinic ambulatory in stable condition.  Alert and oriented X 3.  Follow up with [facility] as scheduled.

## 2021-10-14 ENCOUNTER — Ambulatory Visit (HOSPITAL_COMMUNITY)
Admission: RE | Admit: 2021-10-14 | Discharge: 2021-10-14 | Disposition: A | Discharge status: Home / Self Care | Payer: Medicare Other | Source: Ambulatory Visit | Attending: Hematology | Admitting: Hematology

## 2021-10-14 ENCOUNTER — Other Ambulatory Visit: Payer: Self-pay

## 2021-10-14 ENCOUNTER — Inpatient Hospital Stay (HOSPITAL_COMMUNITY): Payer: Medicare Other

## 2021-10-14 ENCOUNTER — Encounter (HOSPITAL_COMMUNITY): Payer: Self-pay

## 2021-10-14 VITALS — BP 144/78 | HR 88 | Temp 99.6°F | Resp 20

## 2021-10-14 DIAGNOSIS — C221 Intrahepatic bile duct carcinoma: Secondary | ICD-10-CM | POA: Insufficient documentation

## 2021-10-14 DIAGNOSIS — R180 Malignant ascites: Secondary | ICD-10-CM | POA: Insufficient documentation

## 2021-10-14 DIAGNOSIS — Z95828 Presence of other vascular implants and grafts: Secondary | ICD-10-CM

## 2021-10-14 DIAGNOSIS — C787 Secondary malignant neoplasm of liver and intrahepatic bile duct: Secondary | ICD-10-CM | POA: Diagnosis present

## 2021-10-14 MED ORDER — PEGFILGRASTIM-CBQV 6 MG/0.6ML ~~LOC~~ SOSY
6.0000 mg | PREFILLED_SYRINGE | Freq: Once | SUBCUTANEOUS | Status: AC
  Administered 2021-10-14: 6 mg via SUBCUTANEOUS
  Filled 2021-10-14: qty 0.6

## 2021-10-14 NOTE — Procedures (Signed)
INDICATION: Malignant ascites EXAM: ULTRASOUND GUIDED  PARACENTESIS GRIP-IR: Category: Fluids  Subcategory: Paracentesis  Follow-Up: None  MEDICATIONS: None. COMPLICATIONS: None immediate. PROCEDURE: Informed written consent was obtained from the patient after a discussion of the risks, benefits and alternatives to treatment. A timeout was performed prior to the initiation of the procedure.  Initial ultrasound scanning demonstrates a large amount of ascites within the left lower abdominal quadrant. The left lower abdomen was prepped and draped in the usual sterile fashion. 1% lidocaine  was used for local anesthesia.   Following this, a Yueh catheter was introduced. An ultrasound image was saved for documentation purposes. The paracentesis was performed. The catheter was removed and a dressing was applied. The patient tolerated the procedure well without immediate post procedural complication.   FINDINGS: A total of approximately 3.0 L of straw-colored fluid was removed.  IMPRESSION:  Successful ultrasound-guided paracentesis yielding 3.0 liters of peritoneal fluid.

## 2021-10-14 NOTE — Progress Notes (Signed)
Udenyca injection given per orders.Patient tolerated it well without problems. Vitals stable and discharged home from clinic ambulatory. Follow up as scheduled.  

## 2021-10-14 NOTE — Progress Notes (Signed)
PT tolerated left sided paracentesis procedure well today and 3 Liters of clear yellow fluid removed. Pt verbalized understanding of discharge instructions and ambulatory at discharge with no acute distress noted.

## 2021-10-14 NOTE — Patient Instructions (Signed)
[facility]  Discharge Instructions: Thank you for choosing [facility] to provide your oncology and hematology care.  If you have a lab appointment with the Cancer Center, please come in thru the Main Entrance and check in at the main information desk.  Wear comfortable clothing and clothing appropriate for easy access to any Portacath or PICC line.   We strive to give you quality time with your provider. You may need to reschedule your appointment if you arrive late (15 or more minutes).  Arriving late affects you and other patients whose appointments are after yours.  Also, if you miss three or more appointments without notifying the office, you may be dismissed from the clinic at the provider's discretion.      For prescription refill requests, have your pharmacy contact our office and allow 72 hours for refills to be completed.        To help prevent nausea and vomiting after your treatment, we encourage you to take your nausea medication as directed.  BELOW ARE SYMPTOMS THAT SHOULD BE REPORTED IMMEDIATELY: *FEVER GREATER THAN 100.4 F (38 C) OR HIGHER *CHILLS OR SWEATING *NAUSEA AND VOMITING THAT IS NOT CONTROLLED WITH YOUR NAUSEA MEDICATION *UNUSUAL SHORTNESS OF BREATH *UNUSUAL BRUISING OR BLEEDING *URINARY PROBLEMS (pain or burning when urinating, or frequent urination) *BOWEL PROBLEMS (unusual diarrhea, constipation, pain near the anus) TENDERNESS IN MOUTH AND THROAT WITH OR WITHOUT PRESENCE OF ULCERS (sore throat, sores in mouth, or a toothache) UNUSUAL RASH, SWELLING OR PAIN  UNUSUAL VAGINAL DISCHARGE OR ITCHING   Items with * indicate a potential emergency and should be followed up as soon as possible or go to the Emergency Department if any problems should occur.  Please show the CHEMOTHERAPY ALERT CARD or IMMUNOTHERAPY ALERT CARD at check-in to the Emergency Department and triage nurse.  Should you have questions after your visit or need to cancel  or reschedule your appointment, please contact [facility] [phone]  and follow the prompts.  Office hours are 8:00 a.m. to 4:30 p.m. Monday - Friday. Please note that voicemails left after 4:00 p.m. may not be returned until the following business day.  We are closed weekends and major holidays. You have access to a nurse at all times for urgent questions. Please call the main number to the clinic [phone] and follow the prompts.  For any non-urgent questions, you may also contact your provider using MyChart. We now offer e-Visits for anyone 18 and older to request care online for non-urgent symptoms. For details visit [email or web address].   Also download the MyChart app! Go to the app store, search "MyChart", open the app, select [place], and log in with your MyChart username and password.  Due to Covid, a mask is required upon entering the hospital/clinic. If you do not have a mask, one will be given to you upon arrival. For doctor visits, patients may have 1 support person aged 18 or older with them. For treatment visits, patients cannot have anyone with them due to current Covid guidelines and our immunocompromised population.  

## 2021-10-18 ENCOUNTER — Encounter (HOSPITAL_COMMUNITY): Payer: Self-pay

## 2021-10-25 ENCOUNTER — Encounter (HOSPITAL_COMMUNITY): Payer: Self-pay

## 2021-10-26 ENCOUNTER — Other Ambulatory Visit: Payer: Self-pay

## 2021-10-26 ENCOUNTER — Inpatient Hospital Stay (HOSPITAL_BASED_OUTPATIENT_CLINIC_OR_DEPARTMENT_OTHER): Payer: Medicare Other | Admitting: Hematology

## 2021-10-26 ENCOUNTER — Inpatient Hospital Stay (HOSPITAL_COMMUNITY): Payer: Medicare Other

## 2021-10-26 ENCOUNTER — Encounter (HOSPITAL_COMMUNITY): Payer: Self-pay | Admitting: Hematology

## 2021-10-26 VITALS — BP 146/94 | HR 83 | Temp 97.7°F | Resp 18

## 2021-10-26 DIAGNOSIS — C787 Secondary malignant neoplasm of liver and intrahepatic bile duct: Secondary | ICD-10-CM

## 2021-10-26 DIAGNOSIS — R180 Malignant ascites: Secondary | ICD-10-CM

## 2021-10-26 DIAGNOSIS — Z5112 Encounter for antineoplastic immunotherapy: Secondary | ICD-10-CM | POA: Diagnosis not present

## 2021-10-26 DIAGNOSIS — Z08 Encounter for follow-up examination after completed treatment for malignant neoplasm: Secondary | ICD-10-CM

## 2021-10-26 DIAGNOSIS — Z95828 Presence of other vascular implants and grafts: Secondary | ICD-10-CM

## 2021-10-26 DIAGNOSIS — C221 Intrahepatic bile duct carcinoma: Secondary | ICD-10-CM

## 2021-10-26 DIAGNOSIS — Z8509 Personal history of malignant neoplasm of other digestive organs: Secondary | ICD-10-CM

## 2021-10-26 LAB — COMPREHENSIVE METABOLIC PANEL
ALT: 17 U/L (ref 0–44)
AST: 22 U/L (ref 15–41)
Albumin: 2.9 g/dL — ABNORMAL LOW (ref 3.5–5.0)
Alkaline Phosphatase: 112 U/L (ref 38–126)
Anion gap: 10 (ref 5–15)
BUN: 27 mg/dL — ABNORMAL HIGH (ref 8–23)
CO2: 23 mmol/L (ref 22–32)
Calcium: 9 mg/dL (ref 8.9–10.3)
Chloride: 102 mmol/L (ref 98–111)
Creatinine, Ser: 1.11 mg/dL (ref 0.61–1.24)
GFR, Estimated: >60 mL/min (ref >60)
Glucose, Bld: 129 mg/dL — ABNORMAL HIGH (ref 70–99)
Potassium: 4.2 mmol/L (ref 3.5–5.1)
Sodium: 135 mmol/L (ref 135–145)
Total Bilirubin: 0.1 mg/dL — ABNORMAL LOW (ref 0.3–1.2)
Total Protein: 6.1 g/dL — ABNORMAL LOW (ref 6.5–8.1)

## 2021-10-26 LAB — CBC WITH DIFFERENTIAL/PLATELET
Abs Immature Granulocytes: 0.38 10*3/uL — ABNORMAL HIGH (ref 0.00–0.07)
Basophils Absolute: 0.1 10*3/uL (ref 0.0–0.1)
Basophils Relative: 1 %
Eosinophils Absolute: 0.1 10*3/uL (ref 0.0–0.5)
Eosinophils Relative: 1 %
HCT: 41.3 % (ref 39.0–52.0)
Hemoglobin: 12.4 g/dL — ABNORMAL LOW (ref 13.0–17.0)
Immature Granulocytes: 5 %
Lymphocytes Relative: 15 %
Lymphs Abs: 1.2 10*3/uL (ref 0.7–4.0)
MCH: 24.1 pg — ABNORMAL LOW (ref 26.0–34.0)
MCHC: 30 g/dL (ref 30.0–36.0)
MCV: 80.2 fL (ref 80.0–100.0)
Monocytes Absolute: 0.9 10*3/uL (ref 0.1–1.0)
Monocytes Relative: 11 %
Neutro Abs: 5.5 10*3/uL (ref 1.7–7.7)
Neutrophils Relative %: 67 %
Platelets: 443 10*3/uL — ABNORMAL HIGH (ref 150–400)
RBC: 5.15 MIL/uL (ref 4.22–5.81)
RDW: 16.7 % — ABNORMAL HIGH (ref 11.5–15.5)
WBC: 8.2 10*3/uL (ref 4.0–10.5)
nRBC: 0 % (ref 0.0–0.2)

## 2021-10-26 LAB — MAGNESIUM: Magnesium: 1.4 mg/dL — ABNORMAL LOW (ref 1.7–2.4)

## 2021-10-26 MED ORDER — SODIUM CHLORIDE 0.9 % IV SOLN: Freq: Once | INTRAVENOUS | Status: AC

## 2021-10-26 MED ORDER — SODIUM CHLORIDE 0.9 % IV SOLN
1500.0000 mg | Freq: Once | INTRAVENOUS | Status: AC
  Administered 2021-10-26: 1500 mg via INTRAVENOUS
  Filled 2021-10-26: qty 30

## 2021-10-26 MED ORDER — SODIUM CHLORIDE 0.9 % IV SOLN
150.0000 mg | Freq: Once | INTRAVENOUS | Status: AC
  Administered 2021-10-26: 150 mg via INTRAVENOUS
  Filled 2021-10-26: qty 150

## 2021-10-26 MED ORDER — MAGNESIUM OXIDE -MG SUPPLEMENT 400 (240 MG) MG PO TABS: 400.0000 mg | ORAL_TABLET | Freq: Every day | ORAL | 6 refills | Status: DC

## 2021-10-26 MED ORDER — MAGNESIUM SULFATE 2 GM/50ML IV SOLN
2.0000 g | INTRAVENOUS | Status: AC
  Administered 2021-10-26 (×2): 2 g via INTRAVENOUS
  Filled 2021-10-26: qty 50

## 2021-10-26 MED ORDER — SODIUM CHLORIDE 0.9 % IV SOLN
25.0000 mg/m2 | Freq: Once | INTRAVENOUS | Status: AC
  Administered 2021-10-26: 62 mg via INTRAVENOUS
  Filled 2021-10-26: qty 62

## 2021-10-26 MED ORDER — SODIUM CHLORIDE 0.9% FLUSH
10.0000 mL | INTRAVENOUS | Status: DC | PRN
  Administered 2021-10-26: 10 mL

## 2021-10-26 MED ORDER — POTASSIUM CHLORIDE IN NACL 20-0.9 MEQ/L-% IV SOLN
Freq: Once | INTRAVENOUS | Status: DC
  Filled 2021-10-26: qty 1000

## 2021-10-26 MED ORDER — PALONOSETRON HCL INJECTION 0.25 MG/5ML
0.2500 mg | Freq: Once | INTRAVENOUS | Status: AC
  Administered 2021-10-26: 0.25 mg via INTRAVENOUS
  Filled 2021-10-26: qty 5

## 2021-10-26 MED ORDER — POTASSIUM CHLORIDE IN NACL 20-0.9 MEQ/L-% IV SOLN
Freq: Once | INTRAVENOUS | Status: AC
  Filled 2021-10-26: qty 1000

## 2021-10-26 MED ORDER — SODIUM CHLORIDE 0.9 % IV SOLN
10.0000 mg | Freq: Once | INTRAVENOUS | Status: AC
  Administered 2021-10-26: 10 mg via INTRAVENOUS
  Filled 2021-10-26: qty 10

## 2021-10-26 MED ORDER — HEPARIN SOD (PORK) LOCK FLUSH 100 UNIT/ML IV SOLN
500.0000 [IU] | Freq: Once | INTRAVENOUS | Status: AC | PRN
  Administered 2021-10-26: 500 [IU]

## 2021-10-26 MED ORDER — SODIUM CHLORIDE 0.9 % IV SOLN: Freq: Once | INTRAVENOUS | Status: DC

## 2021-10-26 MED ORDER — SODIUM CHLORIDE 0.9 % IV SOLN
1000.0000 mg/m2 | Freq: Once | INTRAVENOUS | Status: AC
  Administered 2021-10-26: 2470 mg via INTRAVENOUS
  Filled 2021-10-26: qty 15.78

## 2021-10-26 NOTE — Patient Instructions (Signed)
[place] at [facility] Discharge Instructions   You were seen and examined today by Dr. Delton Coombes.  He reviewed your lab work which is normal/stable. Your magnesium was low.  We will supplement that with IV magnesium today.  We will proceed with your treatment today.  Return as scheduled for lab work, treatment and office visit.    Thank you for choosing [place] at [facility] to provide your oncology and hematology care.  To afford each patient quality time with our provider, please arrive at least 15 minutes before your scheduled appointment time.   If you have a lab appointment with the [facility] please come in thru the Main Entrance and check in at the main information desk.  You need to re-schedule your appointment should you arrive 10 or more minutes late.  We strive to give you quality time with our providers, and arriving late affects you and other patients whose appointments are after yours.  Also, if you no show three or more times for appointments you may be dismissed from the clinic at the providers discretion.     Again, thank you for choosing [facility].  Our hope is that these requests will decrease the amount of time that you wait before being seen by our physicians.       _____________________________________________________________  Should you have questions after your visit to [facility], please contact our office at [phone] and follow the prompts.  Our office hours are 8:00 a.m. and 4:30 p.m. Monday - Friday.  Please note that voicemails left after 4:00 p.m. may not be returned until the following business day.  We are closed weekends and major holidays.  You do have access to a nurse 24-7, just call the main number to the clinic [phone] and do not press any options, hold on the line and a nurse will answer the phone.    For prescription refill requests, have your pharmacy  contact our office and allow 72 hours.    Due to Covid, you will need to wear a mask upon entering the hospital. If you do not have a mask, a mask will be given to you at the Main Entrance upon arrival. For doctor visits, patients may have 1 support person age 9 or older with them. For treatment visits, patients can not have anyone with them due to social distancing guidelines and our immunocompromised population.

## 2021-10-26 NOTE — Patient Instructions (Signed)
[place]  Discharge Instructions: Thank you for choosing [facility] to provide your oncology and hematology care.  If you have a lab appointment with the [facility], please come in thru the Main Entrance and check in at the main information desk.  Wear comfortable clothing and clothing appropriate for easy access to any Portacath or PICC line.   We strive to give you quality time with your provider. You may need to reschedule your appointment if you arrive late (15 or more minutes).  Arriving late affects you and other patients whose appointments are after yours.  Also, if you miss three or more appointments without notifying the office, you may be dismissed from the clinic at the providers discretion.      For prescription refill requests, have your pharmacy contact our office and allow 72 hours for refills to be completed.    Today you received the following chemotherapy and/or immunotherapy agents Imfinzi,Gemzar, and Cisplatin.   To help prevent nausea and vomiting after your treatment, we encourage you to take your nausea medication as directed.  BELOW ARE SYMPTOMS THAT SHOULD BE REPORTED IMMEDIATELY: *FEVER GREATER THAN 100.4 F (38 C) OR HIGHER *CHILLS OR SWEATING *NAUSEA AND VOMITING THAT IS NOT CONTROLLED WITH YOUR NAUSEA MEDICATION *UNUSUAL SHORTNESS OF BREATH *UNUSUAL BRUISING OR BLEEDING *URINARY PROBLEMS (pain or burning when urinating, or frequent urination) *BOWEL PROBLEMS (unusual diarrhea, constipation, pain near the anus) TENDERNESS IN MOUTH AND THROAT WITH OR WITHOUT PRESENCE OF ULCERS (sore throat, sores in mouth, or a toothache) UNUSUAL RASH, SWELLING OR PAIN  UNUSUAL VAGINAL DISCHARGE OR ITCHING   Items with * indicate a potential emergency and should be followed up as soon as possible or go to the Emergency Department if any problems should occur.  Please show the CHEMOTHERAPY ALERT CARD or IMMUNOTHERAPY ALERT CARD at check-in to  the Emergency Department and triage nurse.  Should you have questions after your visit or need to cancel or reschedule your appointment, please contact [facility] [phone]  and follow the prompts.  Office hours are 8:00 a.m. to 4:30 p.m. Monday - Friday. Please note that voicemails left after 4:00 p.m. may not be returned until the following business day.  We are closed weekends and major holidays. You have access to a nurse at all times for urgent questions. Please call the main number to the clinic [phone] and follow the prompts.  For any non-urgent questions, you may also contact your provider using MyChart. We now offer e-Visits for anyone 34 and older to request care online for non-urgent symptoms. For details visit [email or web address].   Also download the MyChart app! Go to the app store, search "MyChart", open the app, select [place], and log in with your MyChart username and password.  Due to Covid, a mask is required upon entering the hospital/clinic. If you do not have a mask, one will be given to you upon arrival. For doctor visits, patients may have 1 support person aged 37 or older with them. For treatment visits, patients cannot have anyone with them due to current Covid guidelines and our immunocompromised population.

## 2021-10-26 NOTE — Progress Notes (Signed)
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  Marvin Carr, a 66 y.o. male, returns for routine follow-up and consideration for next cycle of chemotherapy. Macai was last seen on 10/05/2021.  Due for cycle #2 of Cisplatin + Gemcitabine today.   Overall, he tells me he has been feeling pretty well. He reports his energy levels are at baseline, and his constipation has resolved with lactulose. He is taking hydrocodone prn at night for his abdominal pain which has improved. His appetite is good. He reports fatigue for 2-3 days following treatment which then resolves. He denies skin rash. He denies tingling/numbness, nausea, and vomiting.   Overall, he feels ready for next cycle of chemo today.    REVIEW OF SYSTEMS:  Review of Systems  Constitutional:  Negative for appetite change and fatigue.  Gastrointestinal:  Negative for abdominal pain (improved), constipation, nausea and vomiting.  Skin:  Negative for rash.  Psychiatric/Behavioral:  Positive for sleep disturbance.   All other systems reviewed and are negative.  PAST MEDICAL/SURGICAL  HISTORY:  Past Medical History:  Diagnosis Date   Anxiety    Bladder cancer ([place]) 09/11/2009   CAD (coronary artery disease), native coronary artery    a. Mildly elevated troponin 03/2013, cath with nonobstructive disease including 50% AV groove distal stenosis before large OM   Essential hypertension    Headache(784.0)    History of migraines   Hyperglycemia    Mixed hyperlipidemia    Neuropathy    Obesity    Port-A-Cath in place 09/30/2021   Pre-diabetes    Seasonal allergies    Sleep apnea    On CPAP   Past Surgical History:  Procedure Laterality Date   BIOPSY  09/23/2021   Procedure: BIOPSY;  Surgeon: Harvel Quale, MD;  [facility];  Service: Gastroenterology;;   COLONOSCOPY  06/28/2011   Procedure: COLONOSCOPY;  Surgeon: Rogene Houston, MD;  [facility];  Service: Endoscopy;  Laterality: N/A;   COLONOSCOPY WITH PROPOFOL N/A 09/23/2021   Procedure: COLONOSCOPY WITH PROPOFOL;  Surgeon: Harvel Quale, MD;  [facility]: [facility];  Service: Gastroenterology;  Laterality: N/A;  940   ESOPHAGOGASTRODUODENOSCOPY (EGD) WITH PROPOFOL N/A 09/23/2021   Procedure: ESOPHAGOGASTRODUODENOSCOPY (EGD) WITH PROPOFOL;  Surgeon: Harvel Quale, MD;  [facility];  Service: Gastroenterology;  Laterality: N/A;   IR IMAGING GUIDED PORT INSERTION  09/28/2021   IR PARACENTESIS  09/28/2021   JOINT REPLACEMENT Right    hip   LEFT HEART CATHETERIZATION WITH CORONARY ANGIOGRAM N/A 03/31/2013   Procedure: LEFT HEART CATHETERIZATION WITH CORONARY ANGIOGRAM;  Surgeon: Minus Breeding, MD;  Location: [facility] CATH  LAB;  Service: Cardiovascular;  Laterality: N/A;   POLYPECTOMY  09/23/2021   Procedure: POLYPECTOMY;  Surgeon: Harvel Quale, MD;  [facility]: [facility];  Service: Gastroenterology;;   TURBT  09/11/2009    SOCIAL HISTORY:  Social History   Socioeconomic History   Marital status: Divorced    Spouse name: Not on file    Number of children: 0   Years of education: college   Highest education level: Not on file  Occupational History    Employer: SELF-EMPLOYED  Tobacco Use   Smoking status: Former    Packs/day: 0.50    Years: 10.00    Pack years: 5.00    Types: Cigarettes    Quit date: 07/10/2011    Years since quitting: 10.3   Smokeless tobacco: Never   Tobacco comments:    Quit several yrs prior to 03/2013.  Vaping Use   Vaping Use: Never used  Substance and Sexual Activity   Alcohol use: Yes    Alcohol/week: 0.0 standard drinks    Comment: Occasional   Drug use: No   Sexual activity: Not on file  Other Topics Concern   Not on file  Social History Narrative   Not on file   Social Determinants of Health   Financial Resource Strain: Not on file  Food Insecurity: Not on file  Transportation Needs: Not on file  Physical Activity: Not on file  Stress: Not on file  Social Connections: Not on file  Intimate Partner Violence: Not on file    FAMILY HISTORY:  Family History  Problem Relation Age of Onset   COPD Father     CURRENT MEDICATIONS:  Current Outpatient Medications  Medication Sig Dispense Refill   aspirin EC 81 MG tablet Take 81 mg by mouth daily.     buPROPion (WELLBUTRIN XL) 300 MG 24 hr tablet Take 300 mg by mouth daily.     CISPLATIN IV Inject into the vein once a week. Day 1, Day 8 q 21 days     cyclobenzaprine (FLEXERIL) 10 MG tablet Take 10 mg by mouth at bedtime as needed for muscle spasms.     Durvalumab (IMFINZI IV) Inject into the vein every 21 ( twenty-one) days.     Eszopiclone 3 MG TABS Take 3 mg by mouth at bedtime.     famotidine (PEPCID) 20 MG tablet Take 1 tablet (20 mg total) by mouth at bedtime. 30 tablet 3   fenofibrate 160 MG tablet Take 160 mg by mouth daily.     FLUoxetine (PROZAC) 10 MG tablet Take 10 mg by mouth daily.     gabapentin (NEURONTIN) 300 MG capsule Take 300 mg by mouth 3 (three) times daily.     Gemcitabine HCl (GEMZAR IV) Inject into the  vein once a week. Day 1, Day 8 q 21 days     HYDROcodone-acetaminophen (NORCO/VICODIN) 5-325 MG tablet Take 1 tablet by mouth every 4 (four) hours as needed for moderate pain. 30 tablet 0   Lactulose 20 GM/30ML SOLN Take 30 mLs (20 g total) by mouth daily. 450 mL 6   lidocaine-prilocaine (EMLA) cream Apply a small amount to port a cath site and cover with plastic wrap 1 hour prior to infusion appointments 30 g 3   magnesium oxide (MAG-OX) 400 (240 Mg) MG tablet Take 1 tablet (400 mg total) by mouth daily. 90 tablet 6   metFORMIN (GLUCOPHAGE-XR) 500 MG 24 hr tablet Take 500 mg by mouth 2 (two) times daily.  nitroGLYCERIN (NITROSTAT) 0.4 MG SL tablet Place 1 tablet (0.4 mg total) under the tongue every 5 (five) minutes as needed for chest pain. 25 tablet 3   olmesartan (BENICAR) 20 MG tablet Take 20 mg by mouth daily.     pantoprazole (PROTONIX) 40 MG tablet Take 1 tablet (40 mg total) by mouth daily. 90 tablet 3   pramipexole (MIRAPEX) 0.5 MG tablet Take 0.5 mg by mouth 3 (three) times daily.      prochlorperazine (COMPAZINE) 10 MG tablet Take 1 tablet (10 mg total) by mouth every 6 (six) hours as needed (Nausea or vomiting). 30 tablet 1   RYBELSUS 7 MG TABS Take 7 mg by mouth daily.     tamsulosin (FLOMAX) 0.4 MG CAPS capsule Take 0.4 mg by mouth daily.     traMADol (ULTRAM) 50 MG tablet Take 50 mg by mouth 4 (four) times daily as needed.     No current facility-administered medications for this visit.    ALLERGIES:  No Known Allergies  PHYSICAL EXAM:  Performance status (ECOG): 0 - Asymptomatic  There were no vitals filed for this visit. Wt Readings from Last 3 Encounters:  10/26/21 246 lb 14.4 oz (112 kg)  10/12/21 261 lb 14.4 oz (118.8 kg)  10/05/21 258 lb 3.2 oz (117.1 kg)   Physical Exam Vitals reviewed.  Constitutional:      Appearance: Normal appearance. He is obese.  Cardiovascular:     Rate and Rhythm: Normal rate and regular rhythm.     Pulses: Normal pulses.      Heart sounds: Normal heart sounds.  Pulmonary:     Effort: Pulmonary effort is normal.     Breath sounds: Normal breath sounds.  Abdominal:     Palpations: Abdomen is soft. There is no mass.     Tenderness: There is no abdominal tenderness.  Musculoskeletal:     Right lower leg: No edema.     Left lower leg: No edema.  Neurological:     General: No focal deficit present.     Mental Status: He is alert and oriented to person, place, and time.  Psychiatric:        Mood and Affect: Mood normal.        Behavior: Behavior normal.    LABORATORY DATA:  I have reviewed the labs as listed.  CBC Latest Ref Rng & Units 10/26/2021 10/12/2021 10/05/2021  WBC 4.0 - 10.5 K/uL 8.2 5.1 7.8  Hemoglobin 13.0 - 17.0 g/dL 12.4(L) 12.8(L) 14.2  Hematocrit 39.0 - 52.0 % 41.3 42.5 46.4  Platelets 150 - 400 K/uL 443(H) 353 553(H)   CMP Latest Ref Rng & Units 10/26/2021 10/12/2021 10/05/2021  Glucose 70 - 99 mg/dL 129(H) 108(H) 109(H)  BUN 8 - 23 mg/dL 27(H) 28(H) 30(H)  Creatinine 0.61 - 1.24 mg/dL 1.11 1.36(H) 1.01  Sodium 135 - 145 mmol/L 135 131(L) 132(L)  Potassium 3.5 - 5.1 mmol/L 4.2 4.4 4.5  Chloride 98 - 111 mmol/L 102 99 98  CO2 22 - 32 mmol/L _0 Calcium 8.9 - 10.3 mg/dL 9.0 8.6(L) 9.2  Total Protein 6.5 - 8.1 g/dL 6.1(L) 5.9(L) 6.1(L)  Total Bilirubin 0.3 - 1.2 mg/dL 0.1(L) 0.3 0.4  Alkaline Phos 38 - 126 U/L 112 66 76  AST 15 - 41 U/L 22 55(H) 32  ALT 0 - 44 U/L 17 40 23    DIAGNOSTIC IMAGING:  I have independently reviewed the scans and discussed with the patient. US Paracentesis  Result Date:  10/14/2021 INDICATION: Malignant ascites EXAM: ULTRASOUND GUIDED  PARACENTESIS MEDICATIONS: None. COMPLICATIONS: None immediate. PROCEDURE: Informed written consent was obtained from the patient after a discussion of the risks, benefits and alternatives to treatment. A timeout was performed prior to the initiation of the procedure. Initial ultrasound scanning demonstrates a large amount of  ascites within the left lower abdominal quadrant. The left lower abdomen was prepped and draped in the usual sterile fashion. 1% lidocaine was used for local anesthesia. Following this, a Yueh catheter was introduced. An ultrasound image was saved for documentation purposes. The paracentesis was performed. The catheter was removed and a dressing was applied. The patient tolerated the procedure well without immediate post procedural complication. FINDINGS: A total of approximately 3.0 L of straw-colored fluid was removed. IMPRESSION: Successful ultrasound-guided paracentesis yielding 3.0 liters of peritoneal fluid. Electronically Signed   By: [place] Clines M.D.   On: 10/14/2021 11:26   US Paracentesis  Result Date: 10/07/2021 INDICATION: Malignant ascites EXAM: ULTRASOUND GUIDED THERAPEUTIC PARACENTESIS MEDICATIONS: None. COMPLICATIONS: None immediate. PROCEDURE: Informed written consent was obtained from the patient after a discussion of the risks, benefits and alternatives to treatment. A timeout was performed prior to the initiation of the procedure. Initial ultrasound scanning demonstrates a large amount of ascites within the LEFT lower abdominal quadrant. The right lower abdomen was prepped and draped in the usual sterile fashion. 1% lidocaine was used for local anesthesia. Following this, a 5 French catheter was introduced. An ultrasound image was saved for documentation purposes. The paracentesis was performed. The catheter was removed and a dressing was applied. The patient tolerated the procedure well without immediate post procedural complication. FINDINGS: A total of approximately 3.9 L of dark yellow ascitic fluid was removed. IMPRESSION: Successful ultrasound-guided paracentesis yielding 3.9 liters of peritoneal fluid. Electronically Signed   By: Lavonia Dana M.D.   On: 10/07/2021 10:21   IR IMAGING GUIDED PORT INSERTION  Result Date: 09/29/2021 INDICATION: Cholangiocarcinoma. EXAM: IMPLANTED  PORT A CATH PLACEMENT WITH ULTRASOUND AND FLUOROSCOPIC GUIDANCE MEDICATIONS: None ANESTHESIA/SEDATION: Moderate (conscious) sedation was employed during this procedure. A total of Versed 2 mg and Fentanyl 100 mcg was administered intravenously. Moderate Sedation Time: 24 minutes. The patient's level of consciousness and vital signs were monitored continuously by radiology nursing throughout the procedure under my direct supervision. FLUOROSCOPY TIME:  0 minutes, 18 seconds (3 mGy) COMPLICATIONS: None immediate. PROCEDURE: The procedure, risks, benefits, and alternatives were explained to the patient. Questions regarding the procedure were encouraged and answered. The patient understands and consents to the procedure. The neck and chest were prepped with chlorhexidine in a sterile fashion, and a sterile drape was applied covering the operative field. Maximum barrier sterile technique with sterile gowns and gloves were used for the procedure. A timeout was performed prior to the initiation of the procedure. Local anesthesia was provided with 1% lidocaine with epinephrine. After creating a small venotomy incision, a micropuncture kit was utilized to access the internal jugular vein under direct, real-time ultrasound guidance. Ultrasound image documentation was performed. The microwire was kinked to measure appropriate catheter length. A subcutaneous port pocket was then created along the upper chest wall utilizing a combination of sharp and blunt dissection. The pocket was irrigated with sterile saline. A single lumen ISP power injectable port was chosen for placement. The 8 Fr catheter was tunneled from the port pocket site to the venotomy incision. The port was placed in the pocket. The external catheter was trimmed to appropriate length. At the venotomy, an  8 Fr peel-away sheath was placed over a guidewire under fluoroscopic guidance. The catheter was then placed through the sheath and the sheath was removed. Final  catheter positioning was confirmed and documented with a fluoroscopic spot radiograph. The port was accessed with a Huber needle, aspirated and flushed with heparinized saline. The port pocket incision was closed with interrupted 3-0 Vicryl suture then Dermabond was applied, including at the venotomy incision. Dressings were placed. The patient tolerated the procedure well without immediate post procedural complication. IMPRESSION: Successful placement of a RIGHT internal jugular approach power injectable Port-A-Cath, with tip located at the proximal RIGHT atrium. The catheter is ready for immediate use. Michaelle Birks, MD [facility] Radiology Electronically Signed   By: Michaelle Birks M.D.   On: 09/29/2021 11:01   IR Paracentesis  Result Date: 09/29/2021 INDICATION: Patient with a history of cholangiocarcinoma presents today with ascites. Interventional radiology asked to perform a therapeutic paracentesis. EXAM: ULTRASOUND GUIDED PARACENTESIS MEDICATIONS: 1% lidocaine 10 mL COMPLICATIONS: None immediate. PROCEDURE: Informed written consent was obtained from the patient after a discussion of the risks, benefits and alternatives to treatment. A timeout was performed prior to the initiation of the procedure. Initial ultrasound scanning demonstrates a large amount of ascites within the LEFT lower abdominal quadrant. The LEFT lower abdomen was prepped and draped in the usual sterile fashion. 1% lidocaine was used for local anesthesia. Following this, a 19 gauge, 7-cm, Yueh catheter was introduced. An ultrasound image was saved for documentation purposes. The paracentesis was performed. The catheter was removed and a dressing was applied. The patient tolerated the procedure well without immediate post procedural complication. FINDINGS: A total of approximately 4.8 L of clear yellow fluid was removed. IMPRESSION: Successful ultrasound-guided therapeutic paracentesis  yielding 4.8 liters of peritoneal fluid. Read by: Soyla Dryer, NP Electronically Signed   By: Michaelle Birks M.D.   On: 09/29/2021 09:04     ASSESSMENT:  Liver lesion/peritoneal carcinomatosis: - Patient seen at the request of Dr. Delphina Cahill - Reported pressure on the sides of the abdomen with slight pain for the last 1 month.  He also reported pain in the epigastric region. - He had lost 20 pounds in the last 3 to 4 months intentionally, cutting back on sugars.  He was also started on semaglutide. - Colonoscopy on 06/28/2011 with a benign polypoid colonic mucosa in the descending colon.  No evidence of malignancy. - MRI of the brain was negative. - EGD and colonoscopy on 09/23/2018 did not reveal any malignancies. 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  IHC positive for CK7, CDX2 and negative for GATA3.  Findings suggestive of upper GI or pancreaticobiliary primary. - Cycle 1 of gemcitabine, cisplatin and durvalumab started on 10/05/2021. - NGS: No targetable mutations.  PD-L1 (HD622) negative.  MSI-stable.  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   Social/family history: - He lives at home by himself.  He records voiceover/narrations. - Non-smoker. - Father died of MDS.  Paternal grandfather had prostate cancer.  Maternal grandfather had cancer.  3.  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 Alinda Money.  Reportedly received 1 treatment of intravesical chemo and has been on surveillance since then.  Last surveillance visit was in 2020.   PLAN:  Cholangiocarcinoma with peritoneal carcinomatosis: - He has tolerated first cycle of chemoimmunotherapy very well. - His abdominal bloating and acid reflux has improved. - He did not have any major GI side effects or  immunotherapy related side effects. - He is able to eat better. - We discussed NGS test results which did not show any targetable mutations. - We reviewed labs  from today which showed normal LFTs.  CBC shows normal white count and platelet count.  Last CEA was 64.8.  CEA from today is pending. - Proceed with cycle 2 without any dose modifications.  RTC 3 weeks for follow-up prior to cycle 3.    Malignant ascites: - Last paracentesis on 10/14/2021, 3 L removed. - Today he does not feel bloated.  3.  Abdominal pain: - Abdominal pain has improved. - She is taking hydrocodone 1 tablet at bedtime.  4.  Constipation: - Continue Colace daily.  Continue lactulose as needed.  5.  Hypomagnesemia: - Magnesium today is 1.4.  He will receive 3 g magnesium IV. - We will start him on magnesium 3 times daily.   Orders placed this encounter:  No orders of the defined types were placed in this encounter.    Derek Jack, MD [place] [phone]   I, Thana Ates, am acting as a scribe for Dr. Derek Jack.  I, Derek Jack MD, have reviewed the above documentation for accuracy and completeness, and I agree with the above.

## 2021-10-26 NOTE — Progress Notes (Signed)
Patients port flushed without difficulty.  Good blood return noted with no bruising or swelling noted at site.  Stable during access and blood draw.  Patient to remain accessed for treatment. 

## 2021-10-26 NOTE — Progress Notes (Signed)
Pt presents today for Imfinzi, Gemzar, and Cisplatin.Okay to proceed with treatment per Dr.K. Pt's MG is 1.4, pt will receive an additional 2g IV MG with hydration per Dr.K.  Pt urinated 292mL prior to Cisplatin administration.  Imfinzi,Gemzar, and Cisplatin given today per MD orders. Tolerated infusion without adverse affects. Vital signs stable. No complaints at this time. Discharged from clinic ambulatory in stable condition. Alert and oriented x 3. F/U with [facility] as scheduled.

## 2021-10-26 NOTE — Progress Notes (Signed)
Patient has been examined by Dr. Delton Coombes, and vital signs and labs have been reviewed. ANC, Creatinine, LFTs, hemoglobin, and platelets are within treatment parameters per M.D. - pt may proceed with treatment.   Will receive magnesium 4 g IV to supplement for hypomagnesemia.

## 2021-10-27 ENCOUNTER — Encounter (HOSPITAL_COMMUNITY): Payer: Self-pay

## 2021-10-27 LAB — CEA: CEA: 85.8 ng/mL — ABNORMAL HIGH (ref 0.0–4.7)

## 2021-11-02 ENCOUNTER — Inpatient Hospital Stay (HOSPITAL_COMMUNITY): Payer: Medicare Other

## 2021-11-02 ENCOUNTER — Other Ambulatory Visit: Payer: Self-pay

## 2021-11-02 VITALS — BP 149/91 | HR 86 | Temp 96.6°F | Resp 18

## 2021-11-02 DIAGNOSIS — C221 Intrahepatic bile duct carcinoma: Secondary | ICD-10-CM

## 2021-11-02 DIAGNOSIS — Z95828 Presence of other vascular implants and grafts: Secondary | ICD-10-CM

## 2021-11-02 DIAGNOSIS — Z5112 Encounter for antineoplastic immunotherapy: Secondary | ICD-10-CM | POA: Diagnosis not present

## 2021-11-02 LAB — CBC WITH DIFFERENTIAL/PLATELET
Abs Immature Granulocytes: 0.04 10*3/uL (ref 0.00–0.07)
Basophils Absolute: 0.1 10*3/uL (ref 0.0–0.1)
Basophils Relative: 2 %
Eosinophils Absolute: 0.1 10*3/uL (ref 0.0–0.5)
Eosinophils Relative: 4 %
HCT: 38.1 % — ABNORMAL LOW (ref 39.0–52.0)
Hemoglobin: 12 g/dL — ABNORMAL LOW (ref 13.0–17.0)
Immature Granulocytes: 1 %
Lymphocytes Relative: 30 %
Lymphs Abs: 1 10*3/uL (ref 0.7–4.0)
MCH: 25.4 pg — ABNORMAL LOW (ref 26.0–34.0)
MCHC: 31.5 g/dL (ref 30.0–36.0)
MCV: 80.7 fL (ref 80.0–100.0)
Monocytes Absolute: 0.6 10*3/uL (ref 0.1–1.0)
Monocytes Relative: 19 %
Neutro Abs: 1.6 10*3/uL — ABNORMAL LOW (ref 1.7–7.7)
Neutrophils Relative %: 44 %
Platelets: 258 10*3/uL (ref 150–400)
RBC: 4.72 MIL/uL (ref 4.22–5.81)
RDW: 16.7 % — ABNORMAL HIGH (ref 11.5–15.5)
WBC: 3.5 10*3/uL — ABNORMAL LOW (ref 4.0–10.5)
nRBC: 0 % (ref 0.0–0.2)

## 2021-11-02 LAB — COMPREHENSIVE METABOLIC PANEL
ALT: 25 U/L (ref 0–44)
AST: 24 U/L (ref 15–41)
Albumin: 3.2 g/dL — ABNORMAL LOW (ref 3.5–5.0)
Alkaline Phosphatase: 103 U/L (ref 38–126)
Anion gap: 8 (ref 5–15)
BUN: 30 mg/dL — ABNORMAL HIGH (ref 8–23)
CO2: 26 mmol/L (ref 22–32)
Calcium: 9.2 mg/dL (ref 8.9–10.3)
Chloride: 99 mmol/L (ref 98–111)
Creatinine, Ser: 1.08 mg/dL (ref 0.61–1.24)
GFR, Estimated: >60 mL/min (ref >60)
Glucose, Bld: 137 mg/dL — ABNORMAL HIGH (ref 70–99)
Potassium: 4.1 mmol/L (ref 3.5–5.1)
Sodium: 133 mmol/L — ABNORMAL LOW (ref 135–145)
Total Bilirubin: 0.4 mg/dL (ref 0.3–1.2)
Total Protein: 6.9 g/dL (ref 6.5–8.1)

## 2021-11-02 LAB — MAGNESIUM: Magnesium: 1.6 mg/dL — ABNORMAL LOW (ref 1.7–2.4)

## 2021-11-02 MED ORDER — POTASSIUM CHLORIDE IN NACL 20-0.9 MEQ/L-% IV SOLN
Freq: Once | INTRAVENOUS | Status: AC
  Filled 2021-11-02: qty 1000

## 2021-11-02 MED ORDER — MAGNESIUM SULFATE 2 GM/50ML IV SOLN
2.0000 g | Freq: Once | INTRAVENOUS | Status: AC
  Administered 2021-11-02: 2 g via INTRAVENOUS
  Filled 2021-11-02: qty 50

## 2021-11-02 MED ORDER — SODIUM CHLORIDE 0.9 % IV SOLN
150.0000 mg | Freq: Once | INTRAVENOUS | Status: AC
  Administered 2021-11-02: 150 mg via INTRAVENOUS
  Filled 2021-11-02: qty 150

## 2021-11-02 MED ORDER — SODIUM CHLORIDE 0.9% FLUSH
10.0000 mL | INTRAVENOUS | Status: DC | PRN
  Administered 2021-11-02: 10 mL

## 2021-11-02 MED ORDER — SODIUM CHLORIDE 0.9 % IV SOLN: Freq: Once | INTRAVENOUS | Status: AC

## 2021-11-02 MED ORDER — PALONOSETRON HCL INJECTION 0.25 MG/5ML
0.2500 mg | Freq: Once | INTRAVENOUS | Status: AC
  Administered 2021-11-02: 0.25 mg via INTRAVENOUS
  Filled 2021-11-02: qty 5

## 2021-11-02 MED ORDER — SODIUM CHLORIDE 0.9 % IV SOLN
1000.0000 mg/m2 | Freq: Once | INTRAVENOUS | Status: AC
  Administered 2021-11-02: 2470 mg via INTRAVENOUS
  Filled 2021-11-02: qty 64.96

## 2021-11-02 MED ORDER — HEPARIN SOD (PORK) LOCK FLUSH 100 UNIT/ML IV SOLN
500.0000 [IU] | Freq: Once | INTRAVENOUS | Status: AC | PRN
  Administered 2021-11-02: 500 [IU]

## 2021-11-02 MED ORDER — SODIUM CHLORIDE 0.9 % IV SOLN
25.0000 mg/m2 | Freq: Once | INTRAVENOUS | Status: AC
  Administered 2021-11-02: 62 mg via INTRAVENOUS
  Filled 2021-11-02: qty 62

## 2021-11-02 MED ORDER — SODIUM CHLORIDE 0.9 % IV SOLN
10.0000 mg | Freq: Once | INTRAVENOUS | Status: AC
  Administered 2021-11-02: 10 mg via INTRAVENOUS
  Filled 2021-11-02: qty 10

## 2021-11-02 NOTE — Patient Instructions (Signed)
[facility]  Discharge Instructions: Thank you for choosing [facility] to provide your oncology and hematology care.  If you have a lab appointment with the [facility], please come in thru the Main Entrance and check in at the main information desk.  Wear comfortable clothing and clothing appropriate for easy access to any Portacath or PICC line.   We strive to give you quality time with your provider. You may need to reschedule your appointment if you arrive late (15 or more minutes).  Arriving late affects you and other patients whose appointments are after yours.  Also, if you miss three or more appointments without notifying the office, you may be dismissed from the clinic at the providers discretion.      For prescription refill requests, have your pharmacy contact our office and allow 72 hours for refills to be completed.    Today you received the following chemotherapy and/or immunotherapy agents Cisplatin and Gemzar    To help prevent nausea and vomiting after your treatment, we encourage you to take your nausea medication as directed.  BELOW ARE SYMPTOMS THAT SHOULD BE REPORTED IMMEDIATELY: *FEVER GREATER THAN 100.4 F (38 C) OR HIGHER *CHILLS OR SWEATING *NAUSEA AND VOMITING THAT IS NOT CONTROLLED WITH YOUR NAUSEA MEDICATION *UNUSUAL SHORTNESS OF BREATH *UNUSUAL BRUISING OR BLEEDING *URINARY PROBLEMS (pain or burning when urinating, or frequent urination) *BOWEL PROBLEMS (unusual diarrhea, constipation, pain near the anus) TENDERNESS IN MOUTH AND THROAT WITH OR WITHOUT PRESENCE OF ULCERS (sore throat, sores in mouth, or a toothache) UNUSUAL RASH, SWELLING OR PAIN  UNUSUAL VAGINAL DISCHARGE OR ITCHING   Items with * indicate a potential emergency and should be followed up as soon as possible or go to the Emergency Department if any problems should occur.  Please show the CHEMOTHERAPY ALERT CARD or IMMUNOTHERAPY ALERT CARD at check-in to the  Emergency Department and triage nurse.  Should you have questions after your visit or need to cancel or reschedule your appointment, please contact [facility] [phone]  and follow the prompts.  Office hours are 8:00 a.m. to 4:30 p.m. Monday - Friday. Please note that voicemails left after 4:00 p.m. may not be returned until the following business day.  We are closed weekends and major holidays. You have access to a nurse at all times for urgent questions. Please call the main number to the clinic [phone] and follow the prompts.  For any non-urgent questions, you may also contact your provider using MyChart. We now offer e-Visits for anyone 43 and older to request care online for non-urgent symptoms. For details visit [email or web address].   Also download the MyChart app! Go to the app store, search "MyChart", open the app, select [facility], and log in with your MyChart username and password.  Due to Covid, a mask is required upon entering the hospital/clinic. If you do not have a mask, one will be given to you upon arrival. For doctor visits, patients may have 1 support person aged 55 or older with them. For treatment visits, patients cannot have anyone with them due to current Covid guidelines and our immunocompromised population.

## 2021-11-02 NOTE — Progress Notes (Signed)
Pt presents today for Gemzar and Cisplatin per provider's order. Vital signs and labs WNL for treatment today. Pt voiced no new complaints at this time. Okay to proceed with treatment per Dr.K.   Pt urinated 373mLs prior to Cisplatin administration.  Gemzar and Cisplatin given today per MD orders. Tolerated infusion without adverse affects. Vital signs stable. No complaints at this time. Discharged from clinic ambulatory in stable condition. Alert and oriented x 3. F/U with [facility] as scheduled.

## 2021-11-04 ENCOUNTER — Other Ambulatory Visit: Payer: Self-pay

## 2021-11-04 ENCOUNTER — Inpatient Hospital Stay (HOSPITAL_COMMUNITY): Payer: Medicare Other

## 2021-11-04 VITALS — BP 141/83 | HR 79 | Temp 97.6°F | Resp 18

## 2021-11-04 DIAGNOSIS — C221 Intrahepatic bile duct carcinoma: Secondary | ICD-10-CM

## 2021-11-04 DIAGNOSIS — Z5112 Encounter for antineoplastic immunotherapy: Secondary | ICD-10-CM | POA: Diagnosis not present

## 2021-11-04 DIAGNOSIS — Z95828 Presence of other vascular implants and grafts: Secondary | ICD-10-CM

## 2021-11-04 MED ORDER — PEGFILGRASTIM-CBQV 6 MG/0.6ML ~~LOC~~ SOSY
6.0000 mg | PREFILLED_SYRINGE | Freq: Once | SUBCUTANEOUS | Status: AC
  Administered 2021-11-04: 6 mg via SUBCUTANEOUS
  Filled 2021-11-04: qty 0.6

## 2021-11-04 NOTE — Patient Instructions (Signed)
[facility]  Discharge Instructions: Thank you for choosing [facility] to provide your oncology and hematology care.  If you have a lab appointment with the Cancer Center, please come in thru the Main Entrance and check in at the main information desk.  Wear comfortable clothing and clothing appropriate for easy access to any Portacath or PICC line.   We strive to give you quality time with your provider. You may need to reschedule your appointment if you arrive late (15 or more minutes).  Arriving late affects you and other patients whose appointments are after yours.  Also, if you miss three or more appointments without notifying the office, you may be dismissed from the clinic at the provider's discretion.      For prescription refill requests, have your pharmacy contact our office and allow 72 hours for refills to be completed.    Today you received Udenyca injection.     BELOW ARE SYMPTOMS THAT SHOULD BE REPORTED IMMEDIATELY: *FEVER GREATER THAN 100.4 F (38 C) OR HIGHER *CHILLS OR SWEATING *NAUSEA AND VOMITING THAT IS NOT CONTROLLED WITH YOUR NAUSEA MEDICATION *UNUSUAL SHORTNESS OF BREATH *UNUSUAL BRUISING OR BLEEDING *URINARY PROBLEMS (pain or burning when urinating, or frequent urination) *BOWEL PROBLEMS (unusual diarrhea, constipation, pain near the anus) TENDERNESS IN MOUTH AND THROAT WITH OR WITHOUT PRESENCE OF ULCERS (sore throat, sores in mouth, or a toothache) UNUSUAL RASH, SWELLING OR PAIN  UNUSUAL VAGINAL DISCHARGE OR ITCHING   Items with * indicate a potential emergency and should be followed up as soon as possible or go to the Emergency Department if any problems should occur.  Please show the CHEMOTHERAPY ALERT CARD or IMMUNOTHERAPY ALERT CARD at check-in to the Emergency Department and triage nurse.  Should you have questions after your visit or need to cancel or reschedule your appointment, please contact [facility]  [phone]  and follow the prompts.  Office hours are 8:00 a.m. to 4:30 p.m. Monday - Friday. Please note that voicemails left after 4:00 p.m. may not be returned until the following business day.  We are closed weekends and major holidays. You have access to a nurse at all times for urgent questions. Please call the main number to the clinic [phone] and follow the prompts.  For any non-urgent questions, you may also contact your provider using MyChart. We now offer e-Visits for anyone 18 and older to request care online for non-urgent symptoms. For details visit [email or web address].   Also download the MyChart app! Go to the app store, search "MyChart", open the app, select [place], and log in with your MyChart username and password.  Due to Covid, a mask is required upon entering the hospital/clinic. If you do not have a mask, one will be given to you upon arrival. For doctor visits, patients may have 1 support person aged 18 or older with them. For treatment visits, patients cannot have anyone with them due to current Covid guidelines and our immunocompromised population.  

## 2021-11-04 NOTE — Progress Notes (Signed)
Marvin Carr presents today for injection per the provider's orders.  Udenyca administration without incident; injection site WNL; see MAR for injection details.  Patient tolerated procedure well and without incident.  No questions or complaints noted at this time.   Discharged from clinic ambulatory in stable condition. Alert and oriented x 3. F/U with [facility] as scheduled.

## 2021-11-16 ENCOUNTER — Inpatient Hospital Stay (HOSPITAL_COMMUNITY): Payer: Medicare Other

## 2021-11-16 ENCOUNTER — Inpatient Hospital Stay (HOSPITAL_COMMUNITY): Payer: Medicare Other | Attending: Hematology

## 2021-11-16 ENCOUNTER — Inpatient Hospital Stay (HOSPITAL_BASED_OUTPATIENT_CLINIC_OR_DEPARTMENT_OTHER): Payer: Medicare Other | Admitting: Hematology

## 2021-11-16 ENCOUNTER — Other Ambulatory Visit: Payer: Self-pay

## 2021-11-16 VITALS — BP 111/66 | HR 86 | Temp 98.1°F | Resp 20

## 2021-11-16 VITALS — BP 126/71 | HR 85 | Temp 97.0°F | Resp 18 | Ht 72.5 in | Wt 247.1 lb

## 2021-11-16 DIAGNOSIS — Z79899 Other long term (current) drug therapy: Secondary | ICD-10-CM | POA: Diagnosis not present

## 2021-11-16 DIAGNOSIS — Z5189 Encounter for other specified aftercare: Secondary | ICD-10-CM | POA: Insufficient documentation

## 2021-11-16 DIAGNOSIS — Z5112 Encounter for antineoplastic immunotherapy: Secondary | ICD-10-CM | POA: Insufficient documentation

## 2021-11-16 DIAGNOSIS — Z809 Family history of malignant neoplasm, unspecified: Secondary | ICD-10-CM | POA: Diagnosis not present

## 2021-11-16 DIAGNOSIS — Z808 Family history of malignant neoplasm of other organs or systems: Secondary | ICD-10-CM | POA: Diagnosis not present

## 2021-11-16 DIAGNOSIS — R180 Malignant ascites: Secondary | ICD-10-CM | POA: Diagnosis not present

## 2021-11-16 DIAGNOSIS — C221 Intrahepatic bile duct carcinoma: Secondary | ICD-10-CM

## 2021-11-16 DIAGNOSIS — R970 Elevated carcinoembryonic antigen [CEA]: Secondary | ICD-10-CM | POA: Insufficient documentation

## 2021-11-16 DIAGNOSIS — Z87891 Personal history of nicotine dependence: Secondary | ICD-10-CM | POA: Insufficient documentation

## 2021-11-16 DIAGNOSIS — Z8551 Personal history of malignant neoplasm of bladder: Secondary | ICD-10-CM | POA: Insufficient documentation

## 2021-11-16 DIAGNOSIS — C787 Secondary malignant neoplasm of liver and intrahepatic bile duct: Secondary | ICD-10-CM | POA: Diagnosis not present

## 2021-11-16 DIAGNOSIS — Z08 Encounter for follow-up examination after completed treatment for malignant neoplasm: Secondary | ICD-10-CM

## 2021-11-16 DIAGNOSIS — D649 Anemia, unspecified: Secondary | ICD-10-CM | POA: Insufficient documentation

## 2021-11-16 DIAGNOSIS — K5900 Constipation, unspecified: Secondary | ICD-10-CM | POA: Insufficient documentation

## 2021-11-16 DIAGNOSIS — Z8509 Personal history of malignant neoplasm of other digestive organs: Secondary | ICD-10-CM

## 2021-11-16 DIAGNOSIS — Z5111 Encounter for antineoplastic chemotherapy: Secondary | ICD-10-CM | POA: Diagnosis present

## 2021-11-16 DIAGNOSIS — C786 Secondary malignant neoplasm of retroperitoneum and peritoneum: Secondary | ICD-10-CM | POA: Diagnosis not present

## 2021-11-16 DIAGNOSIS — Z95828 Presence of other vascular implants and grafts: Secondary | ICD-10-CM

## 2021-11-16 DIAGNOSIS — Z8042 Family history of malignant neoplasm of prostate: Secondary | ICD-10-CM | POA: Diagnosis not present

## 2021-11-16 LAB — CBC WITH DIFFERENTIAL/PLATELET
Abs Immature Granulocytes: 0.1 10*3/uL — ABNORMAL HIGH (ref 0.00–0.07)
Basophils Absolute: 0 10*3/uL (ref 0.0–0.1)
Basophils Relative: 1 %
Eosinophils Absolute: 0.1 10*3/uL (ref 0.0–0.5)
Eosinophils Relative: 2 %
HCT: 34.8 % — ABNORMAL LOW (ref 39.0–52.0)
Hemoglobin: 10.7 g/dL — ABNORMAL LOW (ref 13.0–17.0)
Immature Granulocytes: 1 %
Lymphocytes Relative: 17 %
Lymphs Abs: 1.3 10*3/uL (ref 0.7–4.0)
MCH: 25.1 pg — ABNORMAL LOW (ref 26.0–34.0)
MCHC: 30.7 g/dL (ref 30.0–36.0)
MCV: 81.7 fL (ref 80.0–100.0)
Monocytes Absolute: 0.8 10*3/uL (ref 0.1–1.0)
Monocytes Relative: 11 %
Neutro Abs: 5.2 10*3/uL (ref 1.7–7.7)
Neutrophils Relative %: 68 %
Platelets: 305 10*3/uL (ref 150–400)
RBC: 4.26 MIL/uL (ref 4.22–5.81)
RDW: 20.5 % — ABNORMAL HIGH (ref 11.5–15.5)
WBC: 7.6 10*3/uL (ref 4.0–10.5)
nRBC: 0 % (ref 0.0–0.2)

## 2021-11-16 LAB — COMPREHENSIVE METABOLIC PANEL
ALT: 18 U/L (ref 0–44)
AST: 25 U/L (ref 15–41)
Albumin: 3.5 g/dL (ref 3.5–5.0)
Alkaline Phosphatase: 106 U/L (ref 38–126)
Anion gap: 9 (ref 5–15)
BUN: 37 mg/dL — ABNORMAL HIGH (ref 8–23)
CO2: 24 mmol/L (ref 22–32)
Calcium: 9.2 mg/dL (ref 8.9–10.3)
Chloride: 102 mmol/L (ref 98–111)
Creatinine, Ser: 1.22 mg/dL (ref 0.61–1.24)
GFR, Estimated: >60 mL/min (ref >60)
Glucose, Bld: 117 mg/dL — ABNORMAL HIGH (ref 70–99)
Potassium: 4.2 mmol/L (ref 3.5–5.1)
Sodium: 135 mmol/L (ref 135–145)
Total Bilirubin: 0.5 mg/dL (ref 0.3–1.2)
Total Protein: 6.3 g/dL — ABNORMAL LOW (ref 6.5–8.1)

## 2021-11-16 LAB — MAGNESIUM: Magnesium: 1.6 mg/dL — ABNORMAL LOW (ref 1.7–2.4)

## 2021-11-16 MED ORDER — MAGNESIUM SULFATE 2 GM/50ML IV SOLN
2.0000 g | Freq: Once | INTRAVENOUS | Status: AC
  Administered 2021-11-16: 2 g via INTRAVENOUS
  Filled 2021-11-16: qty 50

## 2021-11-16 MED ORDER — PALONOSETRON HCL INJECTION 0.25 MG/5ML
0.2500 mg | Freq: Once | INTRAVENOUS | Status: AC
  Administered 2021-11-16: 0.25 mg via INTRAVENOUS
  Filled 2021-11-16: qty 5

## 2021-11-16 MED ORDER — POTASSIUM CHLORIDE IN NACL 20-0.9 MEQ/L-% IV SOLN
Freq: Once | INTRAVENOUS | Status: AC
  Filled 2021-11-16: qty 1000

## 2021-11-16 MED ORDER — SODIUM CHLORIDE 0.9 % IV SOLN
150.0000 mg | Freq: Once | INTRAVENOUS | Status: AC
  Administered 2021-11-16: 150 mg via INTRAVENOUS
  Filled 2021-11-16: qty 5

## 2021-11-16 MED ORDER — SODIUM CHLORIDE 0.9 % IV SOLN: Freq: Once | INTRAVENOUS | Status: DC

## 2021-11-16 MED ORDER — SODIUM CHLORIDE 0.9% IV SOLUTION: Freq: Once | INTRAVENOUS | Status: AC

## 2021-11-16 MED ORDER — SODIUM CHLORIDE 0.9 % IV SOLN: Freq: Once | INTRAVENOUS | Status: AC

## 2021-11-16 MED ORDER — MAGNESIUM OXIDE -MG SUPPLEMENT 400 (240 MG) MG PO TABS: 400.0000 mg | ORAL_TABLET | Freq: Two times a day (BID) | ORAL | 6 refills | Status: DC

## 2021-11-16 MED ORDER — POTASSIUM CHLORIDE IN NACL 20-0.9 MEQ/L-% IV SOLN
Freq: Once | INTRAVENOUS | Status: DC
  Filled 2021-11-16: qty 1000

## 2021-11-16 MED ORDER — SODIUM CHLORIDE 0.9% FLUSH
10.0000 mL | INTRAVENOUS | Status: DC | PRN
  Administered 2021-11-16: 10 mL

## 2021-11-16 MED ORDER — SODIUM CHLORIDE 0.9 % IV SOLN
1500.0000 mg | Freq: Once | INTRAVENOUS | Status: AC
  Administered 2021-11-16: 1500 mg via INTRAVENOUS
  Filled 2021-11-16: qty 30

## 2021-11-16 MED ORDER — SODIUM CHLORIDE 0.9 % IV SOLN
25.0000 mg/m2 | Freq: Once | INTRAVENOUS | Status: AC
  Administered 2021-11-16: 62 mg via INTRAVENOUS
  Filled 2021-11-16: qty 62

## 2021-11-16 MED ORDER — SODIUM CHLORIDE 0.9 % IV SOLN
1000.0000 mg/m2 | Freq: Once | INTRAVENOUS | Status: AC
  Administered 2021-11-16: 2470 mg via INTRAVENOUS
  Filled 2021-11-16: qty 64.96

## 2021-11-16 MED ORDER — HEPARIN SOD (PORK) LOCK FLUSH 100 UNIT/ML IV SOLN
500.0000 [IU] | Freq: Once | INTRAVENOUS | Status: AC | PRN
  Administered 2021-11-16: 500 [IU]

## 2021-11-16 MED ORDER — SODIUM CHLORIDE 0.9 % IV SOLN
10.0000 mg | Freq: Once | INTRAVENOUS | Status: AC
  Administered 2021-11-16: 10 mg via INTRAVENOUS
  Filled 2021-11-16: qty 10

## 2021-11-16 NOTE — Patient Instructions (Signed)
[facility]  Discharge Instructions: ?Thank you for choosing [facility] to provide your oncology and hematology care.  ?If you have a lab appointment with the [facility], please come in thru the Main Entrance and check in at the main information desk. ? ?Wear comfortable clothing and clothing appropriate for easy access to any Portacath or PICC line.  ? ?We strive to give you quality time with your provider. You may need to reschedule your appointment if you arrive late (15 or more minutes).  Arriving late affects you and other patients whose appointments are after yours.  Also, if you miss three or more appointments without notifying the office, you may be dismissed from the clinic at the provider?s discretion.    ?  ?For prescription refill requests, have your pharmacy contact our office and allow 72 hours for refills to be completed.   ? ?Today you received the following chemotherapy and/or immunotherapy agents Imfinzi, Gemzar, and Cisplatin. ?  ?To help prevent nausea and vomiting after your treatment, we encourage you to take your nausea medication as directed. ? ?BELOW ARE SYMPTOMS THAT SHOULD BE REPORTED IMMEDIATELY: ?*FEVER GREATER THAN 100.4 F (38 ?C) OR HIGHER ?*CHILLS OR SWEATING ?*NAUSEA AND VOMITING THAT IS NOT CONTROLLED WITH YOUR NAUSEA MEDICATION ?*UNUSUAL SHORTNESS OF BREATH ?*UNUSUAL BRUISING OR BLEEDING ?*URINARY PROBLEMS (pain or burning when urinating, or frequent urination) ?*BOWEL PROBLEMS (unusual diarrhea, constipation, pain near the anus) ?TENDERNESS IN MOUTH AND THROAT WITH OR WITHOUT PRESENCE OF ULCERS (sore throat, sores in mouth, or a toothache) ?UNUSUAL RASH, SWELLING OR PAIN  ?UNUSUAL VAGINAL DISCHARGE OR ITCHING  ? ?Items with * indicate a potential emergency and should be followed up as soon as possible or go to the Emergency Department if any problems should occur. ? ?Please show the CHEMOTHERAPY ALERT CARD or IMMUNOTHERAPY ALERT CARD at check-in to  the Emergency Department and triage nurse. ? ?Should you have questions after your visit or need to cancel or reschedule your appointment, please contact [facility] [phone]  and follow the prompts.  Office hours are 8:00 a.m. to 4:30 p.m. Monday - Friday. Please note that voicemails left after 4:00 p.m. may not be returned until the following business day.  We are closed weekends and major holidays. You have access to a nurse at all times for urgent questions. Please call the main number to the clinic [phone] and follow the prompts. ? ?For any non-urgent questions, you may also contact your provider using MyChart. We now offer e-Visits for anyone 54 and older to request care online for non-urgent symptoms. For details visit [email or web address]. ?  ?Also download the MyChart app! Go to the app store, search "MyChart", open the app, select [place], and log in with your MyChart username and password. ? ?Due to Covid, a mask is required upon entering the hospital/clinic. If you do not have a mask, one will be given to you upon arrival. For doctor visits, patients may have 1 support person aged 51 or older with them. For treatment visits, patients cannot have anyone with them due to current Covid guidelines and our immunocompromised population.  ?

## 2021-11-16 NOTE — Progress Notes (Signed)
Patient has been examined by Dr. Katragadda, and vital signs and labs have been reviewed. ANC, Creatinine, LFTs, hemoglobin, and platelets are within treatment parameters per M.D. - pt may proceed with treatment.    °

## 2021-11-16 NOTE — Patient Instructions (Addendum)
[place] at [facility] ?Discharge Instructions ? ? ?You were seen and examined today by Dr. Delton Coombes. ? ?He reviewed the results of your lab work which is normal/stable. Your magnesium was slightly low.  You will need to take magnesium oxide pills twice a day.  We have sent a prescription for this.  ? ?We will proceed with your treatment today. ? ?Return as scheduled for lab work, office visits, and treatments.  ? ? ? ?Thank you for choosing [place] at [facility] to provide your oncology and hematology care.  To afford each patient quality time with our provider, please arrive at least 15 minutes before your scheduled appointment time.  ? ?If you have a lab appointment with the [facility] please come in thru the Main Entrance and check in at the main information desk. ? ?You need to re-schedule your appointment should you arrive 10 or more minutes late.  We strive to give you quality time with our providers, and arriving late affects you and other patients whose appointments are after yours.  Also, if you no show three or more times for appointments you may be dismissed from the clinic at the providers discretion.     ?Again, thank you for choosing [facility].  Our hope is that these requests will decrease the amount of time that you wait before being seen by our physicians.       ?_____________________________________________________________ ? ?Should you have questions after your visit to [facility], please contact our office at [phone] and follow the prompts.  Our office hours are 8:00 a.m. and 4:30 p.m. Monday - Friday.  Please note that voicemails left after 4:00 p.m. may not be returned until the following business day.  We are closed weekends and major holidays.  You do have access to a nurse 24-7, just call the main number to the clinic [phone] and do not press any options, hold on the line and a nurse will  answer the phone.   ? ?For prescription refill requests, have your pharmacy contact our office and allow 72 hours.   ? ?Due to Covid, you will need to wear a mask upon entering the hospital. If you do not have a mask, a mask will be given to you at the Main Entrance upon arrival. For doctor visits, patients may have 1 support person age 45 or older with them. For treatment visits, patients can not have anyone with them due to social distancing guidelines and our immunocompromised population.  ? ?   ?

## 2021-11-16 NOTE — Progress Notes (Signed)
Pt presents today for Imfinzi, Gemzar, and Cisplatin per provider's order. Vital signs and labs WNL for treatment today.Okay to proceed with treatment today per Dr.K. ? ?Pt urinated 237m prior to Cisplatin administration. ? ?Imfinzi,Gemzar, and Cisplatin given today per MD orders. Tolerated infusion without adverse affects. Vital signs stable. No complaints at this time. Discharged from clinic ambulatory in stable condition. Alert and oriented x 3. F/U with [facility] scheduled.   ? ? ?

## 2021-11-16 NOTE — Progress Notes (Signed)
[address] [place], [place] [address]   CLINIC:  Medical Oncology/Hematology  PCP:  Marvin Squibb, MD [address] 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, a 66 y.o. male, returns for routine follow-up and consideration for next cycle of chemotherapy. Marvin Carr was last seen on 10/26/2021.  Due for cycle #3 of Cisplatin + Gemcitabine today.   Overall, he tells me he has been feeling pretty well. He has no required paracentesis since 2/3. He denies n/v/d, abdominal pain, tingling/numbness, and abdominal distension. He reports dry cough. He denies ankle swellings.   Overall, he feels ready for next cycle of chemo today.   REVIEW OF SYSTEMS:  Review of Systems  Constitutional:  Negative for appetite change and fatigue.  Respiratory:  Positive for cough (dry).   Cardiovascular:  Negative for leg swelling.  Gastrointestinal:  Negative for abdominal distention, abdominal pain, diarrhea, nausea and vomiting.  Neurological:  Negative for numbness.  All other systems reviewed and are negative.  PAST MEDICAL/SURGICAL HISTORY:  Past Medical History:  Diagnosis Date   Anxiety    Bladder cancer ([place]) 09/11/2009   CAD (coronary artery disease),  native coronary artery    a. Mildly elevated troponin 03/2013, cath with nonobstructive disease including 50% AV groove distal stenosis before large OM   Essential hypertension    Headache(784.0)    History of migraines   Hyperglycemia    Mixed hyperlipidemia    Neuropathy    Obesity    Port-A-Cath in place 09/30/2021   Pre-diabetes    Seasonal allergies    Sleep apnea    On CPAP   Past Surgical History:  Procedure Laterality Date   BIOPSY  09/23/2021   Procedure: BIOPSY;  Surgeon: Marvin Quale, MD;  [facility];  Service: Gastroenterology;;   COLONOSCOPY  06/28/2011   Procedure: COLONOSCOPY;  Surgeon: Marvin Houston, MD;  [facility];  Service: Endoscopy;  Laterality: N/A;   COLONOSCOPY WITH PROPOFOL N/A 09/23/2021   Procedure: COLONOSCOPY WITH PROPOFOL;  Surgeon: Marvin Quale, MD;  [facility];  Service: Gastroenterology;  Laterality: N/A;  940   ESOPHAGOGASTRODUODENOSCOPY (EGD) WITH PROPOFOL N/A 09/23/2021   Procedure: ESOPHAGOGASTRODUODENOSCOPY (EGD) WITH PROPOFOL;  Surgeon: Marvin Quale, MD;  [facility];  Service: Gastroenterology;  Laterality: N/A;   IR IMAGING GUIDED PORT INSERTION  09/28/2021   IR PARACENTESIS  09/28/2021   JOINT REPLACEMENT Right    hip   LEFT HEART CATHETERIZATION WITH CORONARY ANGIOGRAM N/A 03/31/2013   Procedure: LEFT HEART CATHETERIZATION WITH CORONARY ANGIOGRAM;  Surgeon: Marvin Breeding, MD;  Location: [facility] CATH LAB;  Service: Cardiovascular;  Laterality: N/A;   POLYPECTOMY  09/23/2021   Procedure: POLYPECTOMY;  Surgeon: Marvin Quale, MD;  [facility];  Service: Gastroenterology;;   TURBT  09/11/2009    SOCIAL HISTORY:  Social History   Socioeconomic History   Marital status: Divorced    Spouse name: Not on file   Number of children: 0   Years of education: college   Highest education level: Not on file  Occupational History    Employer:  SELF-EMPLOYED  Tobacco Use   Smoking status: Former    Packs/day: 0.50    Years: 10.00    Pack years: 5.00    Types: Cigarettes    Quit date: 07/10/2011    Years since quitting: 10.3   Smokeless tobacco: Never   Tobacco comments:    Quit several yrs prior to 03/2013.  Vaping Use   Vaping Use: Never used  Substance and Sexual Activity   Alcohol use: Yes    Alcohol/week: 0.0 standard drinks    Comment: Occasional   Drug use: No   Sexual activity: Not on file  Other Topics Concern   Not on file  Social History Narrative   Not on file   Social Determinants of Health   Financial Resource Strain: Not on file  Food Insecurity: Not on file  Transportation Needs: Not on file  Physical Activity: Not on file  Stress: Not on file  Social Connections: Not on file  Intimate Partner Violence: Not on file    FAMILY HISTORY:  Family History  Problem Relation Age of Onset   COPD Father     CURRENT MEDICATIONS:  Current Outpatient Medications  Medication Sig Dispense Refill   aspirin EC 81 MG tablet Take 81 mg by mouth daily.     buPROPion (WELLBUTRIN XL) 300 MG 24 hr tablet Take 300 mg by mouth daily.     CISPLATIN IV Inject into the vein once a week. Day 1, Day 8 q 21 days     cyclobenzaprine (FLEXERIL) 10 MG tablet Take 10 mg by mouth at bedtime as needed for muscle spasms.     Durvalumab (IMFINZI IV) Inject into the vein every 21 ( twenty-one) days.     Eszopiclone 3 MG TABS Take 3 mg by mouth at bedtime.     famotidine (PEPCID) 20 MG tablet Take 1 tablet (20 mg total) by mouth at bedtime. 30 tablet 3   fenofibrate 160 MG tablet Take 160 mg by mouth daily.     FLUoxetine (PROZAC) 10 MG capsule Take 10 mg by mouth daily.     FLUoxetine (PROZAC) 10 MG tablet Take 10 mg by mouth daily.     gabapentin (NEURONTIN) 300 MG capsule Take 300 mg by mouth 3 (three) times daily.     Gemcitabine HCl (GEMZAR IV) Inject into the vein once a week. Day 1, Day 8 q 21 days      HYDROcodone-acetaminophen (NORCO/VICODIN) 5-325 MG tablet Take 1 tablet by mouth every 4 (four) hours as needed for moderate pain. 30 tablet 0   Lactulose 20 GM/30ML SOLN Take 30 mLs (20 g total) by mouth daily. 450 mL 6   lidocaine-prilocaine (EMLA) cream Apply a small amount to port a cath site and cover with plastic wrap 1 hour prior to infusion appointments 30 g 3   metFORMIN (GLUCOPHAGE-XR) 500 MG 24 hr tablet Take 500 mg by mouth 2 (two) times daily.     nitroGLYCERIN (NITROSTAT) 0.4 MG SL tablet Place 1 tablet (0.4 mg total) under the tongue every 5 (five) minutes as needed for chest pain. 25 tablet 3  olmesartan (BENICAR) 20 MG tablet Take 20 mg by mouth daily.     omeprazole (PRILOSEC) 40 MG capsule Take 40 mg by mouth daily.     pantoprazole (PROTONIX) 40 MG tablet Take 1 tablet (40 mg total) by mouth daily. 90 tablet 3   pramipexole (MIRAPEX) 0.5 MG tablet Take 0.5 mg by mouth 3 (three) times daily.      prochlorperazine (COMPAZINE) 10 MG tablet Take 1 tablet (10 mg total) by mouth every 6 (six) hours as needed (Nausea or vomiting). 30 tablet 1   RYBELSUS 7 MG TABS Take 7 mg by mouth daily.     tamsulosin (FLOMAX) 0.4 MG CAPS capsule Take 0.4 mg by mouth daily.     traMADol (ULTRAM) 50 MG tablet Take 50 mg by mouth 4 (four) times daily as needed.     magnesium oxide (MAG-OX) 400 (240 Mg) MG tablet Take 1 tablet (400 mg total) by mouth 2 (two) times daily. 90 tablet 6   No current facility-administered medications for this visit.    ALLERGIES:  No Known Allergies  PHYSICAL EXAM:  Performance status (ECOG): 0 - Asymptomatic  Vitals:   11/16/21 0753  BP: 126/71  Pulse: 85  Resp: 18  Temp: (!) 97 F (36.1 C)  SpO2: 98%   Wt Readings from Last 3 Encounters:  11/16/21 247 lb 1.6 oz (112.1 kg)  11/02/21 245 lb 8 oz (111.4 kg)  10/26/21 246 lb 14.4 oz (112 kg)   Physical Exam Vitals reviewed.  Constitutional:      Appearance: Normal appearance. He is obese.   Cardiovascular:     Rate and Rhythm: Normal rate and regular rhythm.     Pulses: Normal pulses.     Heart sounds: Normal heart sounds.  Pulmonary:     Effort: Pulmonary effort is normal.     Breath sounds: Normal breath sounds.  Musculoskeletal:     Right lower leg: No edema.     Left lower leg: No edema.  Neurological:     General: No focal deficit present.     Mental Status: He is alert and oriented to person, place, and time.  Psychiatric:        Mood and Affect: Mood normal.        Behavior: Behavior normal.    LABORATORY DATA:  I have reviewed the labs as listed.  CBC Latest Ref Rng & Units 11/16/2021 11/02/2021 10/26/2021  WBC 4.0 - 10.5 K/uL 7.6 3.5(L) 8.2  Hemoglobin 13.0 - 17.0 g/dL 10.7(L) 12.0(L) 12.4(L)  Hematocrit 39.0 - 52.0 % 34.8(L) 38.1(L) 41.3  Platelets 150 - 400 K/uL 305 258 443(H)   CMP Latest Ref Rng & Units 11/16/2021 11/02/2021 10/26/2021  Glucose 70 - 99 mg/dL 117(H) 137(H) 129(H)  BUN 8 - 23 mg/dL 37(H) 30(H) 27(H)  Creatinine 0.61 - 1.24 mg/dL 1.22 1.08 1.11  Sodium 135 - 145 mmol/L 135 133(L) 135  Potassium 3.5 - 5.1 mmol/L 4.2 4.1 4.2  Chloride 98 - 111 mmol/L 102 99 102  CO2 22 - 32 mmol/L _0 Calcium 8.9 - 10.3 mg/dL 9.2 9.2 9.0  Total Protein 6.5 - 8.1 g/dL 6.3(L) 6.9 6.1(L)  Total Bilirubin 0.3 - 1.2 mg/dL 0.5 0.4 0.1(L)  Alkaline Phos 38 - 126 U/L 106 103 112  AST 15 - 41 U/L _1 ALT 0 - 44 U/L _2 DIAGNOSTIC IMAGING:  I have independently reviewed the scans and discussed with the patient.  No results found.   ASSESSMENT:  Liver lesion/peritoneal carcinomatosis: - Patient seen at the request of Dr. Delphina Cahill - Reported pressure on the sides of the abdomen with slight pain for the last 1 month.  He also reported pain in the epigastric region. - He had lost 20 pounds in the last 3 to 4 months intentionally, cutting back on sugars.  He was also started on semaglutide. - Colonoscopy on 06/28/2011 with a benign polypoid  colonic mucosa in the descending colon.  No evidence of malignancy. - MRI of the brain was negative. - EGD and colonoscopy on 09/23/2018 did not reveal any malignancies. 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  IHC positive for CK7, CDX2 and negative for GATA3.  Findings suggestive of upper GI or pancreaticobiliary primary. - Cycle 1 of gemcitabine, cisplatin and durvalumab started on 10/05/2021. - NGS: No targetable mutations.  PD-L1 (WU981) negative.  MSI-stable.  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   Social/family history: - He lives at home by himself.  He records voiceover/narrations. - Non-smoker. - Father died of MDS.  Paternal grandfather had prostate cancer.  Maternal grandfather had cancer.  3.  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 Alinda Money.  Reportedly received 1 treatment of intravesical chemo and has been on surveillance since then.  Last surveillance visit was in 2020.     PLAN:  Cholangiocarcinoma with peritoneal carcinomatosis: - He has tolerated last cycle of chemotherapy very well. - He reports a lot of gas.  He thinks it is worse after drinking milk.  He has tried Gas-X which did not help.  He was told to avoid foods causing excessive gas. - Reviewed labs today which showed normal LFTs and renal function.  CBC was grossly normal with mild anemia consistent with myelosuppression. - Proceed with cycle 3 today without any dose modifications.  RTC 3 weeks with labs and cycle 4.  I plan to repeat scans after cycle 4.    Malignant ascites: - Last paracentesis on 10/14/2021, 3 L removed.  No abdominal distention today.  3.  Abdominal pain: - Abdominal pain has improved.  He is not requiring hydrocodone.  4.  Constipation: - Continue Colace and lactulose as needed.  5.  Hypomagnesemia: - Magnesium today is 1.6.  He is taking magnesium once daily. - We will increase magnesium to twice daily.    Orders placed this encounter:  No orders of the defined types were placed in this encounter.    Derek Jack, MD [place] [phone]   I, Thana Ates, am acting as a scribe for Dr. Derek Jack.  I, Derek Jack MD, have reviewed the above documentation for accuracy and completeness, and I agree with the above.

## 2021-11-17 ENCOUNTER — Ambulatory Visit (INDEPENDENT_AMBULATORY_CARE_PROVIDER_SITE_OTHER): Payer: PRIVATE HEALTH INSURANCE | Admitting: Gastroenterology

## 2021-11-17 LAB — CEA: CEA: 19.6 ng/mL — ABNORMAL HIGH (ref 0.0–4.7)

## 2021-11-23 ENCOUNTER — Inpatient Hospital Stay (HOSPITAL_COMMUNITY): Payer: Medicare Other

## 2021-11-23 VITALS — BP 146/86 | HR 80 | Temp 97.7°F | Resp 18

## 2021-11-23 DIAGNOSIS — Z95828 Presence of other vascular implants and grafts: Secondary | ICD-10-CM

## 2021-11-23 DIAGNOSIS — C221 Intrahepatic bile duct carcinoma: Secondary | ICD-10-CM

## 2021-11-23 DIAGNOSIS — Z5112 Encounter for antineoplastic immunotherapy: Secondary | ICD-10-CM | POA: Diagnosis not present

## 2021-11-23 LAB — MAGNESIUM: Magnesium: 1.6 mg/dL — ABNORMAL LOW (ref 1.7–2.4)

## 2021-11-23 LAB — COMPREHENSIVE METABOLIC PANEL
ALT: 17 U/L (ref 0–44)
AST: 17 U/L (ref 15–41)
Albumin: 3.4 g/dL — ABNORMAL LOW (ref 3.5–5.0)
Alkaline Phosphatase: 77 U/L (ref 38–126)
Anion gap: 7 (ref 5–15)
BUN: 29 mg/dL — ABNORMAL HIGH (ref 8–23)
CO2: 28 mmol/L (ref 22–32)
Calcium: 9 mg/dL (ref 8.9–10.3)
Chloride: 101 mmol/L (ref 98–111)
Creatinine, Ser: 1.06 mg/dL (ref 0.61–1.24)
GFR, Estimated: >60 mL/min (ref >60)
Glucose, Bld: 114 mg/dL — ABNORMAL HIGH (ref 70–99)
Potassium: 4.4 mmol/L (ref 3.5–5.1)
Sodium: 136 mmol/L (ref 135–145)
Total Bilirubin: 0.4 mg/dL (ref 0.3–1.2)
Total Protein: 6 g/dL — ABNORMAL LOW (ref 6.5–8.1)

## 2021-11-23 LAB — CBC WITH DIFFERENTIAL/PLATELET
Abs Immature Granulocytes: 0.02 10*3/uL (ref 0.00–0.07)
Basophils Absolute: 0 10*3/uL (ref 0.0–0.1)
Basophils Relative: 1 %
Eosinophils Absolute: 0.1 10*3/uL (ref 0.0–0.5)
Eosinophils Relative: 3 %
HCT: 32.4 % — ABNORMAL LOW (ref 39.0–52.0)
Hemoglobin: 10.1 g/dL — ABNORMAL LOW (ref 13.0–17.0)
Immature Granulocytes: 1 %
Lymphocytes Relative: 38 %
Lymphs Abs: 1.4 10*3/uL (ref 0.7–4.0)
MCH: 26.1 pg (ref 26.0–34.0)
MCHC: 31.2 g/dL (ref 30.0–36.0)
MCV: 83.7 fL (ref 80.0–100.0)
Monocytes Absolute: 0.5 10*3/uL (ref 0.1–1.0)
Monocytes Relative: 13 %
Neutro Abs: 1.6 10*3/uL — ABNORMAL LOW (ref 1.7–7.7)
Neutrophils Relative %: 44 %
Platelets: 266 10*3/uL (ref 150–400)
RBC: 3.87 MIL/uL — ABNORMAL LOW (ref 4.22–5.81)
RDW: 20.5 % — ABNORMAL HIGH (ref 11.5–15.5)
WBC: 3.6 10*3/uL — ABNORMAL LOW (ref 4.0–10.5)
nRBC: 0 % (ref 0.0–0.2)

## 2021-11-23 MED ORDER — SODIUM CHLORIDE 0.9 % IV SOLN
1000.0000 mg/m2 | Freq: Once | INTRAVENOUS | Status: AC
  Administered 2021-11-23: 2470 mg via INTRAVENOUS
  Filled 2021-11-23: qty 26.3

## 2021-11-23 MED ORDER — MAGNESIUM SULFATE 2 GM/50ML IV SOLN
2.0000 g | Freq: Once | INTRAVENOUS | Status: AC
  Administered 2021-11-23: 2 g via INTRAVENOUS
  Filled 2021-11-23: qty 50

## 2021-11-23 MED ORDER — SODIUM CHLORIDE 0.9% FLUSH
10.0000 mL | INTRAVENOUS | Status: DC | PRN
  Administered 2021-11-23: 10 mL

## 2021-11-23 MED ORDER — POTASSIUM CHLORIDE IN NACL 20-0.9 MEQ/L-% IV SOLN
Freq: Once | INTRAVENOUS | Status: AC
  Filled 2021-11-23: qty 1000

## 2021-11-23 MED ORDER — SODIUM CHLORIDE 0.9 % IV SOLN
Freq: Once | INTRAVENOUS | Status: AC
Start: 2021-11-23 — End: 2021-11-23

## 2021-11-23 MED ORDER — SODIUM CHLORIDE 0.9 % IV SOLN
25.0000 mg/m2 | Freq: Once | INTRAVENOUS | Status: AC
  Administered 2021-11-23: 62 mg via INTRAVENOUS
  Filled 2021-11-23: qty 62

## 2021-11-23 MED ORDER — PALONOSETRON HCL INJECTION 0.25 MG/5ML
0.2500 mg | Freq: Once | INTRAVENOUS | Status: AC
  Administered 2021-11-23: 0.25 mg via INTRAVENOUS
  Filled 2021-11-23: qty 5

## 2021-11-23 MED ORDER — HEPARIN SOD (PORK) LOCK FLUSH 100 UNIT/ML IV SOLN
500.0000 [IU] | Freq: Once | INTRAVENOUS | Status: AC | PRN
  Administered 2021-11-23: 500 [IU]

## 2021-11-23 MED ORDER — SODIUM CHLORIDE 0.9 % IV SOLN
10.0000 mg | Freq: Once | INTRAVENOUS | Status: AC
  Administered 2021-11-23: 10 mg via INTRAVENOUS
  Filled 2021-11-23: qty 10

## 2021-11-23 MED ORDER — SODIUM CHLORIDE 0.9 % IV SOLN
150.0000 mg | Freq: Once | INTRAVENOUS | Status: AC
  Administered 2021-11-23: 150 mg via INTRAVENOUS
  Filled 2021-11-23: qty 150

## 2021-11-23 MED ORDER — SODIUM CHLORIDE 0.9 % IV SOLN: Freq: Once | INTRAVENOUS | Status: AC

## 2021-11-23 NOTE — Patient Instructions (Signed)
[facility]  Discharge Instructions: ?Thank you for choosing [place] to provide your oncology and hematology care.  ?If you have a lab appointment with the [facility], please come in thru the Main Entrance and check in at the main information desk. ? ?Wear comfortable clothing and clothing appropriate for easy access to any Portacath or PICC line.  ? ?We strive to give you quality time with your provider. You may need to reschedule your appointment if you arrive late (15 or more minutes).  Arriving late affects you and other patients whose appointments are after yours.  Also, if you miss three or more appointments without notifying the office, you may be dismissed from the clinic at the provider?s discretion.    ?  ?For prescription refill requests, have your pharmacy contact our office and allow 72 hours for refills to be completed.   ? ?Today you received the following chemotherapy and/or immunotherapy agents Gemzar and Cisplatin. ?  ?To help prevent nausea and vomiting after your treatment, we encourage you to take your nausea medication as directed. ? ?BELOW ARE SYMPTOMS THAT SHOULD BE REPORTED IMMEDIATELY: ?*FEVER GREATER THAN 100.4 F (38 ?C) OR HIGHER ?*CHILLS OR SWEATING ?*NAUSEA AND VOMITING THAT IS NOT CONTROLLED WITH YOUR NAUSEA MEDICATION ?*UNUSUAL SHORTNESS OF BREATH ?*UNUSUAL BRUISING OR BLEEDING ?*URINARY PROBLEMS (pain or burning when urinating, or frequent urination) ?*BOWEL PROBLEMS (unusual diarrhea, constipation, pain near the anus) ?TENDERNESS IN MOUTH AND THROAT WITH OR WITHOUT PRESENCE OF ULCERS (sore throat, sores in mouth, or a toothache) ?UNUSUAL RASH, SWELLING OR PAIN  ?UNUSUAL VAGINAL DISCHARGE OR ITCHING  ? ?Items with * indicate a potential emergency and should be followed up as soon as possible or go to the Emergency Department if any problems should occur. ? ?Please show the CHEMOTHERAPY ALERT CARD or IMMUNOTHERAPY ALERT CARD at check-in to the  Emergency Department and triage nurse. ? ?Should you have questions after your visit or need to cancel or reschedule your appointment, please contact [facility] [phone]  and follow the prompts.  Office hours are 8:00 a.m. to 4:30 p.m. Monday - Friday. Please note that voicemails left after 4:00 p.m. may not be returned until the following business day.  We are closed weekends and major holidays. You have access to a nurse at all times for urgent questions. Please call the main number to the clinic [phone] and follow the prompts. ? ?For any non-urgent questions, you may also contact your provider using MyChart. We now offer e-Visits for anyone 6 and older to request care online for non-urgent symptoms. For details visit [email or web address]. ?  ?Also download the MyChart app! Go to the app store, search "MyChart", open the app, select [facility], and log in with your MyChart username and password. ? ?Due to Covid, a mask is required upon entering the hospital/clinic. If you do not have a mask, one will be given to you upon arrival. For doctor visits, patients may have 1 support person aged 56 or older with them. For treatment visits, patients cannot have anyone with them due to current Covid guidelines and our immunocompromised population.  ?

## 2021-11-23 NOTE — Progress Notes (Signed)
Pt presents today for Gemzar and Cisplation per provider's order. Vital signs and labs WNL for treatment today. Okay to proceed with treatment today. ? ?Pt urinated 248m prior to Cisplatin administration. ? ?Gemzar and Cisplatin given today per MD orders. Tolerated infusion without adverse affects. Vital signs stable. No complaints at this time. Discharged from clinic ambulatory in stable condition. Alert and oriented x 3. [facility]   ?

## 2021-11-25 ENCOUNTER — Inpatient Hospital Stay (HOSPITAL_COMMUNITY): Payer: Medicare Other

## 2021-11-25 ENCOUNTER — Other Ambulatory Visit: Payer: Self-pay

## 2021-11-25 VITALS — BP 123/78 | HR 69 | Temp 97.1°F | Resp 18

## 2021-11-25 DIAGNOSIS — C221 Intrahepatic bile duct carcinoma: Secondary | ICD-10-CM

## 2021-11-25 DIAGNOSIS — Z5112 Encounter for antineoplastic immunotherapy: Secondary | ICD-10-CM | POA: Diagnosis not present

## 2021-11-25 DIAGNOSIS — Z95828 Presence of other vascular implants and grafts: Secondary | ICD-10-CM

## 2021-11-25 MED ORDER — PEGFILGRASTIM-CBQV 6 MG/0.6ML ~~LOC~~ SOSY
6.0000 mg | PREFILLED_SYRINGE | Freq: Once | SUBCUTANEOUS | Status: AC
  Administered 2021-11-25: 6 mg via SUBCUTANEOUS
  Filled 2021-11-25: qty 0.6

## 2021-12-07 ENCOUNTER — Inpatient Hospital Stay (HOSPITAL_COMMUNITY): Payer: Medicare Other

## 2021-12-07 ENCOUNTER — Inpatient Hospital Stay (HOSPITAL_BASED_OUTPATIENT_CLINIC_OR_DEPARTMENT_OTHER): Payer: Medicare Other | Admitting: Hematology

## 2021-12-07 VITALS — BP 121/76 | HR 79 | Temp 97.4°F | Resp 20

## 2021-12-07 DIAGNOSIS — C787 Secondary malignant neoplasm of liver and intrahepatic bile duct: Secondary | ICD-10-CM | POA: Diagnosis not present

## 2021-12-07 DIAGNOSIS — Z95828 Presence of other vascular implants and grafts: Secondary | ICD-10-CM

## 2021-12-07 DIAGNOSIS — C221 Intrahepatic bile duct carcinoma: Secondary | ICD-10-CM | POA: Diagnosis not present

## 2021-12-07 DIAGNOSIS — Z08 Encounter for follow-up examination after completed treatment for malignant neoplasm: Secondary | ICD-10-CM

## 2021-12-07 DIAGNOSIS — Z5112 Encounter for antineoplastic immunotherapy: Secondary | ICD-10-CM | POA: Diagnosis not present

## 2021-12-07 LAB — COMPREHENSIVE METABOLIC PANEL
ALT: 16 U/L (ref 0–44)
AST: 18 U/L (ref 15–41)
Albumin: 3.8 g/dL (ref 3.5–5.0)
Alkaline Phosphatase: 119 U/L (ref 38–126)
Anion gap: 6 (ref 5–15)
BUN: 33 mg/dL — ABNORMAL HIGH (ref 8–23)
CO2: 25 mmol/L (ref 22–32)
Calcium: 9 mg/dL (ref 8.9–10.3)
Chloride: 103 mmol/L (ref 98–111)
Creatinine, Ser: 1.17 mg/dL (ref 0.61–1.24)
GFR, Estimated: >60 mL/min (ref >60)
Glucose, Bld: 122 mg/dL — ABNORMAL HIGH (ref 70–99)
Potassium: 4.1 mmol/L (ref 3.5–5.1)
Sodium: 134 mmol/L — ABNORMAL LOW (ref 135–145)
Total Bilirubin: 0.3 mg/dL (ref 0.3–1.2)
Total Protein: 6.3 g/dL — ABNORMAL LOW (ref 6.5–8.1)

## 2021-12-07 LAB — CBC WITH DIFFERENTIAL/PLATELET
Abs Immature Granulocytes: 0.2 10*3/uL — ABNORMAL HIGH (ref 0.00–0.07)
Basophils Absolute: 0.1 10*3/uL (ref 0.0–0.1)
Basophils Relative: 1 %
Eosinophils Absolute: 0.1 10*3/uL (ref 0.0–0.5)
Eosinophils Relative: 1 %
HCT: 31.3 % — ABNORMAL LOW (ref 39.0–52.0)
Hemoglobin: 10.1 g/dL — ABNORMAL LOW (ref 13.0–17.0)
Immature Granulocytes: 2 %
Lymphocytes Relative: 17 %
Lymphs Abs: 1.6 10*3/uL (ref 0.7–4.0)
MCH: 28 pg (ref 26.0–34.0)
MCHC: 32.3 g/dL (ref 30.0–36.0)
MCV: 86.7 fL (ref 80.0–100.0)
Monocytes Absolute: 1.1 10*3/uL — ABNORMAL HIGH (ref 0.1–1.0)
Monocytes Relative: 12 %
Neutro Abs: 6.5 10*3/uL (ref 1.7–7.7)
Neutrophils Relative %: 67 %
Platelets: 281 10*3/uL (ref 150–400)
RBC: 3.61 MIL/uL — ABNORMAL LOW (ref 4.22–5.81)
RDW: 25.5 % — ABNORMAL HIGH (ref 11.5–15.5)
WBC: 9.7 10*3/uL (ref 4.0–10.5)
nRBC: 0.2 % (ref 0.0–0.2)

## 2021-12-07 LAB — MAGNESIUM: Magnesium: 1.9 mg/dL (ref 1.7–2.4)

## 2021-12-07 MED ORDER — HEPARIN SOD (PORK) LOCK FLUSH 100 UNIT/ML IV SOLN
500.0000 [IU] | Freq: Once | INTRAVENOUS | Status: AC | PRN
  Administered 2021-12-07: 500 [IU]

## 2021-12-07 MED ORDER — POTASSIUM CHLORIDE IN NACL 20-0.9 MEQ/L-% IV SOLN
Freq: Once | INTRAVENOUS | Status: AC
  Filled 2021-12-07: qty 1000

## 2021-12-07 MED ORDER — SODIUM CHLORIDE 0.9 % IV SOLN
10.0000 mg | Freq: Once | INTRAVENOUS | Status: AC
  Administered 2021-12-07: 10 mg via INTRAVENOUS
  Filled 2021-12-07: qty 10

## 2021-12-07 MED ORDER — SODIUM CHLORIDE 0.9 % IV SOLN
1000.0000 mg/m2 | Freq: Once | INTRAVENOUS | Status: AC
  Administered 2021-12-07: 2470 mg via INTRAVENOUS
  Filled 2021-12-07: qty 52.6

## 2021-12-07 MED ORDER — MAGNESIUM SULFATE 2 GM/50ML IV SOLN
2.0000 g | Freq: Once | INTRAVENOUS | Status: AC
  Administered 2021-12-07: 2 g via INTRAVENOUS
  Filled 2021-12-07: qty 50

## 2021-12-07 MED ORDER — SODIUM CHLORIDE 0.9 % IV SOLN: Freq: Once | INTRAVENOUS | Status: AC

## 2021-12-07 MED ORDER — PALONOSETRON HCL INJECTION 0.25 MG/5ML
0.2500 mg | Freq: Once | INTRAVENOUS | Status: AC
  Administered 2021-12-07: 0.25 mg via INTRAVENOUS
  Filled 2021-12-07: qty 5

## 2021-12-07 MED ORDER — SODIUM CHLORIDE 0.9% FLUSH
10.0000 mL | INTRAVENOUS | Status: DC | PRN
  Administered 2021-12-07: 10 mL

## 2021-12-07 MED ORDER — SODIUM CHLORIDE 0.9 % IV SOLN
150.0000 mg | Freq: Once | INTRAVENOUS | Status: AC
  Administered 2021-12-07: 150 mg via INTRAVENOUS
  Filled 2021-12-07: qty 150

## 2021-12-07 MED ORDER — SODIUM CHLORIDE 0.9 % IV SOLN
25.0000 mg/m2 | Freq: Once | INTRAVENOUS | Status: AC
  Administered 2021-12-07: 62 mg via INTRAVENOUS
  Filled 2021-12-07: qty 62

## 2021-12-07 MED ORDER — SODIUM CHLORIDE 0.9 % IV SOLN
1500.0000 mg | Freq: Once | INTRAVENOUS | Status: AC
  Administered 2021-12-07: 1500 mg via INTRAVENOUS
  Filled 2021-12-07: qty 30

## 2021-12-07 NOTE — Patient Instructions (Signed)
[place] at [facility] ?Discharge Instructions ? ? ?You were seen and examined today by Dr. Delton Coombes. ? ?He reviewed the results of your lab work which are normal/stable. ? ?We will proceed with your treatment today.  We will repeat a CT scan after this cycle. ? ?Return as scheduled for treatment and office visit.  ? ? ?Thank you for choosing [place] at [facility] to provide your oncology and hematology care.  To afford each patient quality time with our provider, please arrive at least 15 minutes before your scheduled appointment time.  ? ?If you have a lab appointment with the [facility] please come in thru the Main Entrance and check in at the main information desk. ? ?You need to re-schedule your appointment should you arrive 10 or more minutes late.  We strive to give you quality time with our providers, and arriving late affects you and other patients whose appointments are after yours.  Also, if you no show three or more times for appointments you may be dismissed from the clinic at the providers discretion.     ?Again, thank you for choosing [facility].  Our hope is that these requests will decrease the amount of time that you wait before being seen by our physicians.       ?_____________________________________________________________ ? ?Should you have questions after your visit to [facility], please contact our office at [phone] and follow the prompts.  Our office hours are 8:00 a.m. and 4:30 p.m. Monday - Friday.  Please note that voicemails left after 4:00 p.m. may not be returned until the following business day.  We are closed weekends and major holidays.  You do have access to a nurse 24-7, just call the main number to the clinic [phone] and do not press any options, hold on the line and a nurse will answer the phone.   ? ?For prescription refill requests, have your pharmacy contact our office and allow  72 hours.   ? ?Due to Covid, you will need to wear a mask upon entering the hospital. If you do not have a mask, a mask will be given to you at the Main Entrance upon arrival. For doctor visits, patients may have 1 support person age 15 or older with them. For treatment visits, patients can not have anyone with them due to social distancing guidelines and our immunocompromised population.  ? ?   ?

## 2021-12-07 NOTE — Progress Notes (Signed)
Patient has been examined by Dr. Katragadda, and vital signs and labs have been reviewed. ANC, Creatinine, LFTs, hemoglobin, and platelets are within treatment parameters per M.D. - pt may proceed with treatment.    °

## 2021-12-07 NOTE — Patient Instructions (Signed)
[place]  Discharge Instructions: ?Thank you for choosing [facility] to provide your oncology and hematology care.  ?If you have a lab appointment with the [facility], please come in thru the Main Entrance and check in at the main information desk. ? ?Wear comfortable clothing and clothing appropriate for easy access to any Portacath or PICC line.  ? ?We strive to give you quality time with your provider. You may need to reschedule your appointment if you arrive late (15 or more minutes).  Arriving late affects you and other patients whose appointments are after yours.  Also, if you miss three or more appointments without notifying the office, you may be dismissed from the clinic at the provider?s discretion.    ?  ?For prescription refill requests, have your pharmacy contact our office and allow 72 hours for refills to be completed.   ? ?Today you received the following chemotherapy and/or immunotherapy agents gemzar, cisplatin, and imfinzi.  ?  ?To help prevent nausea and vomiting after your treatment, we encourage you to take your nausea medication as directed. ? ?BELOW ARE SYMPTOMS THAT SHOULD BE REPORTED IMMEDIATELY: ?*FEVER GREATER THAN 100.4 F (38 ?C) OR HIGHER ?*CHILLS OR SWEATING ?*NAUSEA AND VOMITING THAT IS NOT CONTROLLED WITH YOUR NAUSEA MEDICATION ?*UNUSUAL SHORTNESS OF BREATH ?*UNUSUAL BRUISING OR BLEEDING ?*URINARY PROBLEMS (pain or burning when urinating, or frequent urination) ?*BOWEL PROBLEMS (unusual diarrhea, constipation, pain near the anus) ?TENDERNESS IN MOUTH AND THROAT WITH OR WITHOUT PRESENCE OF ULCERS (sore throat, sores in mouth, or a toothache) ?UNUSUAL RASH, SWELLING OR PAIN  ?UNUSUAL VAGINAL DISCHARGE OR ITCHING  ? ?Items with * indicate a potential emergency and should be followed up as soon as possible or go to the Emergency Department if any problems should occur. ? ?Please show the CHEMOTHERAPY ALERT CARD or IMMUNOTHERAPY ALERT CARD at check-in to  the Emergency Department and triage nurse. ? ?Should you have questions after your visit or need to cancel or reschedule your appointment, please contact [facility] [phone]  and follow the prompts.  Office hours are 8:00 a.m. to 4:30 p.m. Monday - Friday. Please note that voicemails left after 4:00 p.m. may not be returned until the following business day.  We are closed weekends and major holidays. You have access to a nurse at all times for urgent questions. Please call the main number to the clinic [phone] and follow the prompts. ? ?For any non-urgent questions, you may also contact your provider using MyChart. We now offer e-Visits for anyone 67 and older to request care online for non-urgent symptoms. For details visit [email or web address]. ?  ?Also download the MyChart app! Go to the app store, search "MyChart", open the app, select [facility], and log in with your MyChart username and password. ? ?Due to Covid, a mask is required upon entering the hospital/clinic. If you do not have a mask, one will be given to you upon arrival. For doctor visits, patients may have 1 support person aged 38 or older with them. For treatment visits, patients cannot have anyone with them due to current Covid guidelines and our immunocompromised population.  ?

## 2021-12-07 NOTE — Progress Notes (Signed)
Patient tolerated chemotherapy with no complaints voiced.  Side effects with management reviewed with understanding verbalized.  Port site clean and dry with no bruising or swelling noted at site.  Good blood return noted before and after administration of chemotherapy.  Band aid applied.  Patient left in satisfactory condition with VSS and no s/s of distress noted.   

## 2021-12-07 NOTE — Progress Notes (Signed)
? ?[facility] ?[address] ?[place], [place] [address] ? ? ?CLINIC:  ?Medical Oncology/Hematology ? ?PCP:  ?Celene Squibb, MD ?[address] [place] [address] ?[phone] ? ? 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 ?PRIOR THERAPY: none ? ?NGS Results: not done ? ?CURRENT THERAPY: Cisplatin + Gemcitabine D1,8 every 3 weeks ? 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  ?10/05/2021 -  Chemotherapy  ? Patient is on Treatment Plan : BILIARY TRACT Cisplatin + Gemcitabine + Imfinzi D1,8 q21d  ?   ? ? ?CANCER STAGING: ? 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 ? ?INTERVAL HISTORY:  ?[name], a 66 y.o. male, returns for routine follow-up and consideration for next cycle of chemotherapy. [name] was last seen on 11/16/2021. ? ?Due for cycle #4 of Cisplatin + Gemcitabine today.  ? ?Overall, he tells me he has been feeling pretty well. He denies n/v/d, SOB, skin rash, mouth sores, and heart burn. He reports tingling across his abdomen, and he denies abdominal pain and distention. He denies tingling/numbness in his hands and feet. He reports he had a dry cough following his last treatment which resolved 1 week ago. His constipation is well controlled with lactulose. He is taking magnesium BID and tolerating it well. His appetite is good. He reports increased fatigue last week but reports it did not interfere with his daily activities.  ? ?Overall, he feels ready for next cycle of chemo today.  ? ?REVIEW OF SYSTEMS:  ?Review of Systems  ?Constitutional:  Negative for appetite change and fatigue.  ?HENT:   Negative for mouth sores.   ?Respiratory:  Negative for cough (resolved) and  shortness of breath.   ?Gastrointestinal:  Negative for abdominal distention, abdominal pain, diarrhea, nausea and vomiting.  ?Skin:  Negative for rash.  ?Neurological:  Positive for headaches and numbness (tingling - abdomen).  ?Psychiatric/Behavioral:  Positive for sleep disturbance.   ?All other systems reviewed and are negative. ? ?PAST MEDICAL/SURGICAL HISTORY:  ?Past Medical History:  ?Diagnosis Date  ? Anxiety   ? Bladder cancer ([place]) 09/11/2009  ? CAD (coronary artery disease), native coronary artery   ? a. Mildly elevated troponin 03/2013, cath with nonobstructive disease including 50% AV groove distal stenosis before large OM  ? Essential hypertension   ? Headache(784.0)   ? History of migraines  ? Hyperglycemia   ? Mixed hyperlipidemia   ? Neuropathy   ? Obesity   ? Port-A-Cath in place 09/30/2021  ? Pre-diabetes   ? Seasonal allergies   ? Sleep apnea   ? On CPAP  ? ?Past Surgical History:  ?Procedure Laterality Date  ? BIOPSY  09/23/2021  ? Procedure: BIOPSY;  Surgeon: Harvel Quale, MD;  Location: [place];  Service: Gastroenterology;;  ? COLONOSCOPY  06/28/2011  ? Procedure: COLONOSCOPY;  Surgeon: Rogene Houston, MD;  Location: [place];  Service: Endoscopy;  Laterality: N/A;  ? COLONOSCOPY WITH PROPOFOL N/A 09/23/2021  ? Procedure: COLONOSCOPY WITH PROPOFOL;  Surgeon: Harvel Quale, MD;  Location: [place];  Service: Gastroenterology;  Laterality: N/A;  940  ? ESOPHAGOGASTRODUODENOSCOPY (EGD) WITH PROPOFOL N/A 09/23/2021  ? Procedure: ESOPHAGOGASTRODUODENOSCOPY (EGD) WITH PROPOFOL;  Surgeon: Harvel Quale,  MD;  Location: [place];  Service: Gastroenterology;  Laterality: N/A;  ? IR IMAGING GUIDED PORT INSERTION  09/28/2021  ? IR PARACENTESIS  09/28/2021  ? JOINT REPLACEMENT Right   ? hip  ? LEFT HEART CATHETERIZATION WITH CORONARY ANGIOGRAM N/A 03/31/2013  ? Procedure: LEFT HEART CATHETERIZATION WITH CORONARY ANGIOGRAM;  Surgeon: Minus Breeding, MD;   Location: [place] CATH LAB;  Service: Cardiovascular;  Laterality: N/A;  ? POLYPECTOMY  09/23/2021  ? Procedure: POLYPECTOMY;  Surgeon: Harvel Quale, MD;  Location: [place];  Service: Gastroenterology;;  ? TURBT  09/11/2009  ? ? ?SOCIAL HISTORY:  ?Social History  ? ?Socioeconomic History  ? Marital status: Divorced  ?  Spouse name: Not on file  ? Number of children: 0  ? Years of education: college  ? Highest education level: Not on file  ?Occupational History  ?  Employer: SELF-EMPLOYED  ?Tobacco Use  ? Smoking status: Former  ?  Packs/day: 0.50  ?  Years: 10.00  ?  Pack years: 5.00  ?  Types: Cigarettes  ?  Quit date: 07/10/2011  ?  Years since quitting: 10.4  ? Smokeless tobacco: Never  ? Tobacco comments:  ?  Quit several yrs prior to 03/2013.  ?Vaping Use  ? Vaping Use: Never used  ?Substance and Sexual Activity  ? Alcohol use: Yes  ?  Alcohol/week: 0.0 standard drinks  ?  Comment: Occasional  ? Drug use: No  ? Sexual activity: Not on file  ?Other Topics Concern  ? Not on file  ?Social History Narrative  ? Not on file  ? ?Social Determinants of Health  ? ?Financial Resource Strain: Not on file  ?Food Insecurity: Not on file  ?Transportation Needs: Not on file  ?Physical Activity: Not on file  ?Stress: Not on file  ?Social Connections: Not on file  ?Intimate Partner Violence: Not on file  ? ? ?FAMILY HISTORY:  ?Family History  ?Problem Relation Age of Onset  ? COPD Father   ? ? ?CURRENT MEDICATIONS:  ?Current Outpatient Medications  ?Medication Sig Dispense Refill  ? aspirin EC 81 MG tablet Take 81 mg by mouth daily.    ? buPROPion (WELLBUTRIN XL) 300 MG 24 hr tablet Take 300 mg by mouth daily.    ? CISPLATIN IV Inject into the vein once a week. Day 1, Day 8 q 21 days    ? cyclobenzaprine (FLEXERIL) 10 MG tablet Take 10 mg by mouth at bedtime as needed for muscle spasms.    ? Durvalumab (IMFINZI IV) Inject into the vein every 21 ( twenty-one) days.    ? Eszopiclone 3 MG TABS Take 3 mg by mouth at  bedtime.    ? famotidine (PEPCID) 20 MG tablet Take 1 tablet (20 mg total) by mouth at bedtime. 30 tablet 3  ? fenofibrate 160 MG tablet Take 160 mg by mouth daily.    ? FLUoxetine (PROZAC) 10 MG capsule Take 10 mg by mouth daily.    ? FLUoxetine (PROZAC) 10 MG tablet Take 10 mg by mouth daily.    ? gabapentin (NEURONTIN) 300 MG capsule Take 300 mg by mouth 3 (three) times daily.    ? Gemcitabine HCl (GEMZAR IV) Inject into the vein once a week. Day 1, Day 8 q 21 days    ? HYDROcodone-acetaminophen (NORCO/VICODIN) 5-325 MG tablet Take 1 tablet by mouth every 4 (four) hours as needed for moderate pain. 30 tablet 0  ? Lactulose 20 GM/30ML SOLN Take 30 mLs (  20 g total) by mouth daily. 450 mL 6  ? lidocaine-prilocaine (EMLA) cream Apply a small amount to port a cath site and cover with plastic wrap 1 hour prior to infusion appointments 30 g 3  ? magnesium oxide (MAG-OX) 400 (240 Mg) MG tablet Take 1 tablet (400 mg total) by mouth 2 (two) times daily. 90 tablet 6  ? metFORMIN (GLUCOPHAGE-XR) 500 MG 24 hr tablet Take 500 mg by mouth 2 (two) times daily.    ? nitroGLYCERIN (NITROSTAT) 0.4 MG SL tablet Place 1 tablet (0.4 mg total) under the tongue every 5 (five) minutes as needed for chest pain. 25 tablet 3  ? olmesartan (BENICAR) 20 MG tablet Take 20 mg by mouth daily.    ? omeprazole (PRILOSEC) 40 MG capsule Take 40 mg by mouth daily.    ? pantoprazole (PROTONIX) 40 MG tablet Take 1 tablet (40 mg total) by mouth daily. 90 tablet 3  ? pramipexole (MIRAPEX) 0.5 MG tablet Take 0.5 mg by mouth 3 (three) times daily.     ? prochlorperazine (COMPAZINE) 10 MG tablet Take 1 tablet (10 mg total) by mouth every 6 (six) hours as needed (Nausea or vomiting). 30 tablet 1  ? RYBELSUS 7 MG TABS Take 7 mg by mouth daily.    ? tamsulosin (FLOMAX) 0.4 MG CAPS capsule Take 0.4 mg by mouth daily.    ? traMADol (ULTRAM) 50 MG tablet Take 50 mg by mouth 4 (four) times daily as needed.    ? ?No current facility-administered medications for  this visit.  ? ? ?ALLERGIES:  ?No Known Allergies ? ?PHYSICAL EXAM:  ?Performance status (ECOG): 0 - Asymptomatic ? ?There were no vitals filed for this visit. ?Wt Readings from Last 3 Encounters:  ?11/23/21

## 2021-12-08 ENCOUNTER — Encounter (HOSPITAL_COMMUNITY): Payer: Self-pay | Admitting: Hematology

## 2021-12-08 LAB — CEA: CEA: 5.9 ng/mL — ABNORMAL HIGH (ref 0.0–4.7)

## 2021-12-14 ENCOUNTER — Other Ambulatory Visit (HOSPITAL_COMMUNITY): Payer: Self-pay | Admitting: Hematology

## 2021-12-14 ENCOUNTER — Inpatient Hospital Stay (HOSPITAL_COMMUNITY): Payer: Medicare Other | Attending: Hematology

## 2021-12-14 ENCOUNTER — Inpatient Hospital Stay (HOSPITAL_COMMUNITY): Payer: Medicare Other

## 2021-12-14 ENCOUNTER — Encounter (HOSPITAL_COMMUNITY): Payer: Self-pay

## 2021-12-14 VITALS — BP 140/87 | HR 79 | Temp 98.0°F | Resp 18

## 2021-12-14 DIAGNOSIS — Z809 Family history of malignant neoplasm, unspecified: Secondary | ICD-10-CM | POA: Insufficient documentation

## 2021-12-14 DIAGNOSIS — Z8042 Family history of malignant neoplasm of prostate: Secondary | ICD-10-CM | POA: Insufficient documentation

## 2021-12-14 DIAGNOSIS — Z5189 Encounter for other specified aftercare: Secondary | ICD-10-CM | POA: Diagnosis not present

## 2021-12-14 DIAGNOSIS — C221 Intrahepatic bile duct carcinoma: Secondary | ICD-10-CM | POA: Diagnosis present

## 2021-12-14 DIAGNOSIS — G4700 Insomnia, unspecified: Secondary | ICD-10-CM | POA: Diagnosis not present

## 2021-12-14 DIAGNOSIS — C786 Secondary malignant neoplasm of retroperitoneum and peritoneum: Secondary | ICD-10-CM | POA: Diagnosis not present

## 2021-12-14 DIAGNOSIS — Z5111 Encounter for antineoplastic chemotherapy: Secondary | ICD-10-CM | POA: Diagnosis present

## 2021-12-14 DIAGNOSIS — Z87891 Personal history of nicotine dependence: Secondary | ICD-10-CM | POA: Diagnosis not present

## 2021-12-14 DIAGNOSIS — Z95828 Presence of other vascular implants and grafts: Secondary | ICD-10-CM

## 2021-12-14 DIAGNOSIS — K5900 Constipation, unspecified: Secondary | ICD-10-CM | POA: Insufficient documentation

## 2021-12-14 DIAGNOSIS — Z808 Family history of malignant neoplasm of other organs or systems: Secondary | ICD-10-CM | POA: Insufficient documentation

## 2021-12-14 DIAGNOSIS — D649 Anemia, unspecified: Secondary | ICD-10-CM | POA: Insufficient documentation

## 2021-12-14 DIAGNOSIS — G629 Polyneuropathy, unspecified: Secondary | ICD-10-CM | POA: Insufficient documentation

## 2021-12-14 DIAGNOSIS — Z5112 Encounter for antineoplastic immunotherapy: Secondary | ICD-10-CM | POA: Insufficient documentation

## 2021-12-14 DIAGNOSIS — Z8551 Personal history of malignant neoplasm of bladder: Secondary | ICD-10-CM | POA: Insufficient documentation

## 2021-12-14 DIAGNOSIS — Z79899 Other long term (current) drug therapy: Secondary | ICD-10-CM | POA: Diagnosis not present

## 2021-12-14 DIAGNOSIS — R180 Malignant ascites: Secondary | ICD-10-CM | POA: Diagnosis not present

## 2021-12-14 DIAGNOSIS — F5101 Primary insomnia: Secondary | ICD-10-CM

## 2021-12-14 LAB — CBC WITH DIFFERENTIAL/PLATELET
Abs Immature Granulocytes: 0.03 10*3/uL (ref 0.00–0.07)
Basophils Absolute: 0 10*3/uL (ref 0.0–0.1)
Basophils Relative: 1 %
Eosinophils Absolute: 0.1 10*3/uL (ref 0.0–0.5)
Eosinophils Relative: 1 %
HCT: 29 % — ABNORMAL LOW (ref 39.0–52.0)
Hemoglobin: 9.5 g/dL — ABNORMAL LOW (ref 13.0–17.0)
Immature Granulocytes: 1 %
Lymphocytes Relative: 32 %
Lymphs Abs: 1.1 10*3/uL (ref 0.7–4.0)
MCH: 28.4 pg (ref 26.0–34.0)
MCHC: 32.8 g/dL (ref 30.0–36.0)
MCV: 86.8 fL (ref 80.0–100.0)
Monocytes Absolute: 0.5 10*3/uL (ref 0.1–1.0)
Monocytes Relative: 14 %
Neutro Abs: 1.8 10*3/uL (ref 1.7–7.7)
Neutrophils Relative %: 51 %
Platelets: 281 10*3/uL (ref 150–400)
RBC: 3.34 MIL/uL — ABNORMAL LOW (ref 4.22–5.81)
RDW: 24.7 % — ABNORMAL HIGH (ref 11.5–15.5)
WBC: 3.5 10*3/uL — ABNORMAL LOW (ref 4.0–10.5)
nRBC: 0 % (ref 0.0–0.2)

## 2021-12-14 LAB — COMPREHENSIVE METABOLIC PANEL
ALT: 17 U/L (ref 0–44)
AST: 18 U/L (ref 15–41)
Albumin: 3.7 g/dL (ref 3.5–5.0)
Alkaline Phosphatase: 76 U/L (ref 38–126)
Anion gap: 8 (ref 5–15)
BUN: 32 mg/dL — ABNORMAL HIGH (ref 8–23)
CO2: 26 mmol/L (ref 22–32)
Calcium: 9.4 mg/dL (ref 8.9–10.3)
Chloride: 102 mmol/L (ref 98–111)
Creatinine, Ser: 1.1 mg/dL (ref 0.61–1.24)
GFR, Estimated: >60 mL/min (ref >60)
Glucose, Bld: 94 mg/dL (ref 70–99)
Potassium: 4 mmol/L (ref 3.5–5.1)
Sodium: 136 mmol/L (ref 135–145)
Total Bilirubin: 0.3 mg/dL (ref 0.3–1.2)
Total Protein: 6.2 g/dL — ABNORMAL LOW (ref 6.5–8.1)

## 2021-12-14 LAB — MAGNESIUM: Magnesium: 1.7 mg/dL (ref 1.7–2.4)

## 2021-12-14 MED ORDER — SODIUM CHLORIDE 0.9 % IV SOLN
150.0000 mg | Freq: Once | INTRAVENOUS | Status: AC
  Administered 2021-12-14: 150 mg via INTRAVENOUS
  Filled 2021-12-14: qty 150

## 2021-12-14 MED ORDER — POTASSIUM CHLORIDE IN NACL 20-0.9 MEQ/L-% IV SOLN
Freq: Once | INTRAVENOUS | Status: AC
  Filled 2021-12-14: qty 1000

## 2021-12-14 MED ORDER — SODIUM CHLORIDE 0.9 % IV SOLN
10.0000 mg | Freq: Once | INTRAVENOUS | Status: AC
  Administered 2021-12-14: 10 mg via INTRAVENOUS
  Filled 2021-12-14: qty 10

## 2021-12-14 MED ORDER — HEPARIN SOD (PORK) LOCK FLUSH 100 UNIT/ML IV SOLN
500.0000 [IU] | Freq: Once | INTRAVENOUS | Status: AC | PRN
  Administered 2021-12-14: 500 [IU]

## 2021-12-14 MED ORDER — PALONOSETRON HCL INJECTION 0.25 MG/5ML
0.2500 mg | Freq: Once | INTRAVENOUS | Status: AC
  Administered 2021-12-14: 0.25 mg via INTRAVENOUS
  Filled 2021-12-14: qty 5

## 2021-12-14 MED ORDER — SODIUM CHLORIDE 0.9% FLUSH
10.0000 mL | INTRAVENOUS | Status: DC | PRN
  Administered 2021-12-14 (×2): 10 mL

## 2021-12-14 MED ORDER — MAGNESIUM SULFATE 2 GM/50ML IV SOLN
2.0000 g | Freq: Once | INTRAVENOUS | Status: AC
  Administered 2021-12-14: 2 g via INTRAVENOUS
  Filled 2021-12-14: qty 50

## 2021-12-14 MED ORDER — SODIUM CHLORIDE 0.9 % IV SOLN: Freq: Once | INTRAVENOUS | Status: AC

## 2021-12-14 MED ORDER — SODIUM CHLORIDE 0.9 % IV SOLN
25.0000 mg/m2 | Freq: Once | INTRAVENOUS | Status: AC
  Administered 2021-12-14: 62 mg via INTRAVENOUS
  Filled 2021-12-14: qty 62

## 2021-12-14 MED ORDER — TRAZODONE HCL 100 MG PO TABS: 100.0000 mg | ORAL_TABLET | Freq: Every day | ORAL | 0 refills | Status: DC

## 2021-12-14 MED ORDER — SODIUM CHLORIDE 0.9 % IV SOLN
1000.0000 mg/m2 | Freq: Once | INTRAVENOUS | Status: AC
  Administered 2021-12-14: 2470 mg via INTRAVENOUS
  Filled 2021-12-14: qty 52.6

## 2021-12-14 NOTE — Patient Instructions (Signed)
[place]  Discharge Instructions: ?Thank you for choosing [facility] to provide your oncology and hematology care.  ?If you have a lab appointment with the [facility], please come in thru the Main Entrance and check in at the main information desk. ? ?Wear comfortable clothing and clothing appropriate for easy access to any Portacath or PICC line.  ? ?We strive to give you quality time with your provider. You may need to reschedule your appointment if you arrive late (15 or more minutes).  Arriving late affects you and other patients whose appointments are after yours.  Also, if you miss three or more appointments without notifying the office, you may be dismissed from the clinic at the provider?s discretion.    ?  ?For prescription refill requests, have your pharmacy contact our office and allow 72 hours for refills to be completed.   ? ?Today you received the following chemotherapy and/or immunotherapy agents Gemzar and Cisplatin. A prescription for Trazodone was sent to your pharmacy, stop taking Lunesta per Dr. Delton Coombes. Return as scheduled. ?  ?To help prevent nausea and vomiting after your treatment, we encourage you to take your nausea medication as directed. ? ?BELOW ARE SYMPTOMS THAT SHOULD BE REPORTED IMMEDIATELY: ?*FEVER GREATER THAN 100.4 F (38 ?C) OR HIGHER ?*CHILLS OR SWEATING ?*NAUSEA AND VOMITING THAT IS NOT CONTROLLED WITH YOUR NAUSEA MEDICATION ?*UNUSUAL SHORTNESS OF BREATH ?*UNUSUAL BRUISING OR BLEEDING ?*URINARY PROBLEMS (pain or burning when urinating, or frequent urination) ?*BOWEL PROBLEMS (unusual diarrhea, constipation, pain near the anus) ?TENDERNESS IN MOUTH AND THROAT WITH OR WITHOUT PRESENCE OF ULCERS (sore throat, sores in mouth, or a toothache) ?UNUSUAL RASH, SWELLING OR PAIN  ?UNUSUAL VAGINAL DISCHARGE OR ITCHING  ? ?Items with * indicate a potential emergency and should be followed up as soon as possible or go to the Emergency Department if any  problems should occur. ? ?Please show the CHEMOTHERAPY ALERT CARD or IMMUNOTHERAPY ALERT CARD at check-in to the Emergency Department and triage nurse. ? ?Should you have questions after your visit or need to cancel or reschedule your appointment, please contact [facility] [phone]  and follow the prompts.  Office hours are 8:00 a.m. to 4:30 p.m. Monday - Friday. Please note that voicemails left after 4:00 p.m. may not be returned until the following business day.  We are closed weekends and major holidays. You have access to a nurse at all times for urgent questions. Please call the main number to the clinic [phone] and follow the prompts. ? ?For any non-urgent questions, you may also contact your provider using MyChart. We now offer e-Visits for anyone 21 and older to request care online for non-urgent symptoms. For details visit [email or web address]. ?  ?Also download the MyChart app! Go to the app store, search "MyChart", open the app, select [facility], and log in with your MyChart username and password. ? ?Due to Covid, a mask is required upon entering the hospital/clinic. If you do not have a mask, one will be given to you upon arrival. For doctor visits, patients may have 1 support person aged 28 or older with them. For treatment visits, patients cannot have anyone with them due to current Covid guidelines and our immunocompromised population.  ?

## 2021-12-14 NOTE — Progress Notes (Signed)
Patient tolerated chemotherapy with no complaints voiced. Side effects with management reviewed understanding verbalized. Port site clean and dry with no bruising or swelling noted at site. Good blood return noted before and after administration of chemotherapy. Band aid applied. Patient left in satisfactory condition with VSS and no s/s of distress noted. 

## 2021-12-15 ENCOUNTER — Encounter (HOSPITAL_COMMUNITY): Payer: Self-pay

## 2021-12-15 ENCOUNTER — Inpatient Hospital Stay (HOSPITAL_COMMUNITY): Payer: Medicare Other

## 2021-12-15 VITALS — BP 123/72 | HR 78 | Temp 99.0°F | Resp 18

## 2021-12-15 DIAGNOSIS — Z95828 Presence of other vascular implants and grafts: Secondary | ICD-10-CM

## 2021-12-15 DIAGNOSIS — C221 Intrahepatic bile duct carcinoma: Secondary | ICD-10-CM

## 2021-12-15 DIAGNOSIS — Z5112 Encounter for antineoplastic immunotherapy: Secondary | ICD-10-CM | POA: Diagnosis not present

## 2021-12-15 MED ORDER — PEGFILGRASTIM-CBQV 6 MG/0.6ML ~~LOC~~ SOSY
6.0000 mg | PREFILLED_SYRINGE | Freq: Once | SUBCUTANEOUS | Status: AC
  Administered 2021-12-15: 6 mg via SUBCUTANEOUS

## 2021-12-15 NOTE — Patient Instructions (Signed)
[facility]  Discharge Instructions: Thank you for choosing [facility] to provide your oncology and hematology care.  If you have a lab appointment with the Cancer Center, please come in thru the Main Entrance and check in at the main information desk.  Wear comfortable clothing and clothing appropriate for easy access to any Portacath or PICC line.   We strive to give you quality time with your provider. You may need to reschedule your appointment if you arrive late (15 or more minutes).  Arriving late affects you and other patients whose appointments are after yours.  Also, if you miss three or more appointments without notifying the office, you may be dismissed from the clinic at the provider's discretion.      For prescription refill requests, have your pharmacy contact our office and allow 72 hours for refills to be completed.    Today you received the following [place], return as scheduled.  To help prevent nausea and vomiting after your treatment, we encourage you to take your nausea medication as directed.  BELOW ARE SYMPTOMS THAT SHOULD BE REPORTED IMMEDIATELY: *FEVER GREATER THAN 100.4 F (38 C) OR HIGHER *CHILLS OR SWEATING *NAUSEA AND VOMITING THAT IS NOT CONTROLLED WITH YOUR NAUSEA MEDICATION *UNUSUAL SHORTNESS OF BREATH *UNUSUAL BRUISING OR BLEEDING *URINARY PROBLEMS (pain or burning when urinating, or frequent urination) *BOWEL PROBLEMS (unusual diarrhea, constipation, pain near the anus) TENDERNESS IN MOUTH AND THROAT WITH OR WITHOUT PRESENCE OF ULCERS (sore throat, sores in mouth, or a toothache) UNUSUAL RASH, SWELLING OR PAIN  UNUSUAL VAGINAL DISCHARGE OR ITCHING   Items with * indicate a potential emergency and should be followed up as soon as possible or go to the Emergency Department if any problems should occur.  Please show the CHEMOTHERAPY ALERT CARD or IMMUNOTHERAPY ALERT CARD at check-in to the Emergency Department and triage  nurse.  Should you have questions after your visit or need to cancel or reschedule your appointment, please contact [facility] [phone]  and follow the prompts.  Office hours are 8:00 a.m. to 4:30 p.m. Monday - Friday. Please note that voicemails left after 4:00 p.m. may not be returned until the following business day.  We are closed weekends and major holidays. You have access to a nurse at all times for urgent questions. Please call the main number to the clinic [phone] and follow the prompts.  For any non-urgent questions, you may also contact your provider using MyChart. We now offer e-Visits for anyone 18 and older to request care online for non-urgent symptoms. For details visit [email or web address].   Also download the MyChart app! Go to the app store, search "MyChart", open the app, select [facility], and log in with your MyChart username and password.  Due to Covid, a mask is required upon entering the hospital/clinic. If you do not have a mask, one will be given to you upon arrival. For doctor visits, patients may have 1 support person aged 18 or older with them. For treatment visits, patients cannot have anyone with them due to current Covid guidelines and our immunocompromised population.  

## 2021-12-15 NOTE — Progress Notes (Signed)
Patient tolerated injection with no complaints voiced. Site clean and dry with no bruising or swelling noted at site. See MAR for details. Band aid applied.  Patient stable during and after injection. VSS with discharge and left in satisfactory condition with no s/s of distress noted.  

## 2021-12-19 ENCOUNTER — Ambulatory Visit (INDEPENDENT_AMBULATORY_CARE_PROVIDER_SITE_OTHER): Payer: Medicare Other | Admitting: Gastroenterology

## 2021-12-20 ENCOUNTER — Ambulatory Visit (HOSPITAL_BASED_OUTPATIENT_CLINIC_OR_DEPARTMENT_OTHER)
Admission: RE | Admit: 2021-12-20 | Discharge: 2021-12-20 | Disposition: A | Discharge status: Home / Self Care | Payer: Medicare Other | Source: Ambulatory Visit | Attending: Hematology | Admitting: Hematology

## 2021-12-20 ENCOUNTER — Encounter (HOSPITAL_BASED_OUTPATIENT_CLINIC_OR_DEPARTMENT_OTHER): Payer: Self-pay

## 2021-12-20 DIAGNOSIS — C221 Intrahepatic bile duct carcinoma: Secondary | ICD-10-CM | POA: Insufficient documentation

## 2021-12-20 DIAGNOSIS — C787 Secondary malignant neoplasm of liver and intrahepatic bile duct: Secondary | ICD-10-CM | POA: Diagnosis present

## 2021-12-20 MED ORDER — IOHEXOL 300 MG/ML  SOLN
100.0000 mL | Freq: Once | INTRAMUSCULAR | Status: AC | PRN
  Administered 2021-12-20: 85 mL via INTRAVENOUS

## 2021-12-20 MED ORDER — HEPARIN SOD (PORK) LOCK FLUSH 100 UNIT/ML IV SOLN
500.0000 [IU] | Freq: Once | INTRAVENOUS | Status: AC
  Administered 2021-12-20: 500 [IU] via INTRAVENOUS

## 2021-12-20 MED ORDER — SODIUM CHLORIDE 0.9% FLUSH
10.0000 mL | INTRAVENOUS | Status: DC | PRN
  Filled 2021-12-20: qty 10

## 2021-12-26 ENCOUNTER — Ambulatory Visit (HOSPITAL_COMMUNITY)
Admission: RE | Admit: 2021-12-26 | Discharge: 2021-12-26 | Disposition: A | Discharge status: Home / Self Care | Payer: Medicare Other | Source: Ambulatory Visit | Attending: Hematology | Admitting: Hematology

## 2021-12-26 ENCOUNTER — Other Ambulatory Visit (HOSPITAL_COMMUNITY): Payer: Self-pay | Admitting: Hematology

## 2021-12-26 ENCOUNTER — Encounter (HOSPITAL_COMMUNITY): Payer: Self-pay

## 2021-12-26 DIAGNOSIS — R180 Malignant ascites: Secondary | ICD-10-CM | POA: Insufficient documentation

## 2021-12-26 DIAGNOSIS — C787 Secondary malignant neoplasm of liver and intrahepatic bile duct: Secondary | ICD-10-CM | POA: Diagnosis present

## 2021-12-26 DIAGNOSIS — C221 Intrahepatic bile duct carcinoma: Secondary | ICD-10-CM | POA: Diagnosis present

## 2021-12-28 ENCOUNTER — Inpatient Hospital Stay (HOSPITAL_BASED_OUTPATIENT_CLINIC_OR_DEPARTMENT_OTHER): Payer: Medicare Other | Admitting: Hematology

## 2021-12-28 ENCOUNTER — Encounter (HOSPITAL_COMMUNITY): Payer: Self-pay | Admitting: Hematology

## 2021-12-28 ENCOUNTER — Inpatient Hospital Stay (HOSPITAL_COMMUNITY): Payer: Medicare Other

## 2021-12-28 ENCOUNTER — Encounter (HOSPITAL_COMMUNITY): Payer: Self-pay | Admitting: Surgery

## 2021-12-28 VITALS — BP 155/86 | HR 75 | Temp 97.2°F | Resp 18 | Ht 72.0 in | Wt 252.6 lb

## 2021-12-28 VITALS — BP 155/84 | HR 87 | Temp 97.2°F | Resp 18

## 2021-12-28 DIAGNOSIS — Z79899 Other long term (current) drug therapy: Secondary | ICD-10-CM | POA: Diagnosis not present

## 2021-12-28 DIAGNOSIS — Z95828 Presence of other vascular implants and grafts: Secondary | ICD-10-CM

## 2021-12-28 DIAGNOSIS — C787 Secondary malignant neoplasm of liver and intrahepatic bile duct: Secondary | ICD-10-CM

## 2021-12-28 DIAGNOSIS — D649 Anemia, unspecified: Secondary | ICD-10-CM | POA: Diagnosis not present

## 2021-12-28 DIAGNOSIS — C221 Intrahepatic bile duct carcinoma: Secondary | ICD-10-CM

## 2021-12-28 DIAGNOSIS — Z5112 Encounter for antineoplastic immunotherapy: Secondary | ICD-10-CM | POA: Diagnosis not present

## 2021-12-28 LAB — CBC WITH DIFFERENTIAL/PLATELET
Abs Immature Granulocytes: 0.12 10*3/uL — ABNORMAL HIGH (ref 0.00–0.07)
Basophils Absolute: 0.1 10*3/uL (ref 0.0–0.1)
Basophils Relative: 1 %
Eosinophils Absolute: 0.1 10*3/uL (ref 0.0–0.5)
Eosinophils Relative: 1 %
HCT: 28.1 % — ABNORMAL LOW (ref 39.0–52.0)
Hemoglobin: 9.2 g/dL — ABNORMAL LOW (ref 13.0–17.0)
Immature Granulocytes: 1 %
Lymphocytes Relative: 13 %
Lymphs Abs: 1.1 10*3/uL (ref 0.7–4.0)
MCH: 31 pg (ref 26.0–34.0)
MCHC: 32.7 g/dL (ref 30.0–36.0)
MCV: 94.6 fL (ref 80.0–100.0)
Monocytes Absolute: 1 10*3/uL (ref 0.1–1.0)
Monocytes Relative: 11 %
Neutro Abs: 6.5 10*3/uL (ref 1.7–7.7)
Neutrophils Relative %: 73 %
Platelets: 230 10*3/uL (ref 150–400)
RBC: 2.97 MIL/uL — ABNORMAL LOW (ref 4.22–5.81)
WBC: 9 10*3/uL (ref 4.0–10.5)
nRBC: 0 % (ref 0.0–0.2)

## 2021-12-28 LAB — COMPREHENSIVE METABOLIC PANEL
ALT: 16 U/L (ref 0–44)
AST: 23 U/L (ref 15–41)
Albumin: 3.6 g/dL (ref 3.5–5.0)
Alkaline Phosphatase: 102 U/L (ref 38–126)
Anion gap: 7 (ref 5–15)
BUN: 37 mg/dL — ABNORMAL HIGH (ref 8–23)
CO2: 25 mmol/L (ref 22–32)
Calcium: 9.4 mg/dL (ref 8.9–10.3)
Chloride: 106 mmol/L (ref 98–111)
Creatinine, Ser: 1.09 mg/dL (ref 0.61–1.24)
GFR, Estimated: >60 mL/min (ref >60)
Glucose, Bld: 86 mg/dL (ref 70–99)
Potassium: 4.3 mmol/L (ref 3.5–5.1)
Sodium: 138 mmol/L (ref 135–145)
Total Bilirubin: 0.3 mg/dL (ref 0.3–1.2)
Total Protein: 6.2 g/dL — ABNORMAL LOW (ref 6.5–8.1)

## 2021-12-28 LAB — MAGNESIUM: Magnesium: 1.9 mg/dL (ref 1.7–2.4)

## 2021-12-28 MED ORDER — SODIUM CHLORIDE 0.9 % IV SOLN
1000.0000 mg/m2 | Freq: Once | INTRAVENOUS | Status: AC
  Administered 2021-12-28: 2470 mg via INTRAVENOUS
  Filled 2021-12-28: qty 52.6

## 2021-12-28 MED ORDER — PRAMIPEXOLE DIHYDROCHLORIDE 0.75 MG PO TABS
0.7500 mg | ORAL_TABLET | Freq: Three times a day (TID) | ORAL | 4 refills | Status: DC
Start: 2021-12-28 — End: 2022-06-19

## 2021-12-28 MED ORDER — SODIUM CHLORIDE 0.9 % IV SOLN
25.0000 mg/m2 | Freq: Once | INTRAVENOUS | Status: AC
  Administered 2021-12-28: 62 mg via INTRAVENOUS
  Filled 2021-12-28: qty 62

## 2021-12-28 MED ORDER — SODIUM CHLORIDE 0.9 % IV SOLN
1500.0000 mg | Freq: Once | INTRAVENOUS | Status: AC
  Administered 2021-12-28: 1500 mg via INTRAVENOUS
  Filled 2021-12-28: qty 30

## 2021-12-28 MED ORDER — PALONOSETRON HCL INJECTION 0.25 MG/5ML
0.2500 mg | Freq: Once | INTRAVENOUS | Status: AC
  Administered 2021-12-28: 0.25 mg via INTRAVENOUS
  Filled 2021-12-28: qty 5

## 2021-12-28 MED ORDER — SODIUM CHLORIDE 0.9 % IV SOLN: Freq: Once | INTRAVENOUS | Status: AC

## 2021-12-28 MED ORDER — SODIUM CHLORIDE 0.9 % IV SOLN
150.0000 mg | Freq: Once | INTRAVENOUS | Status: AC
  Administered 2021-12-28: 150 mg via INTRAVENOUS
  Filled 2021-12-28: qty 150

## 2021-12-28 MED ORDER — SODIUM CHLORIDE 0.9% FLUSH
10.0000 mL | INTRAVENOUS | Status: DC | PRN
  Administered 2021-12-28: 10 mL

## 2021-12-28 MED ORDER — POTASSIUM CHLORIDE IN NACL 20-0.9 MEQ/L-% IV SOLN
Freq: Once | INTRAVENOUS | Status: AC
  Filled 2021-12-28: qty 1000

## 2021-12-28 MED ORDER — HEPARIN SOD (PORK) LOCK FLUSH 100 UNIT/ML IV SOLN
500.0000 [IU] | Freq: Once | INTRAVENOUS | Status: AC | PRN
  Administered 2021-12-28: 500 [IU]

## 2021-12-28 MED ORDER — SODIUM CHLORIDE 0.9 % IV SOLN
10.0000 mg | Freq: Once | INTRAVENOUS | Status: AC
  Administered 2021-12-28: 10 mg via INTRAVENOUS
  Filled 2021-12-28: qty 1

## 2021-12-28 MED ORDER — ZALEPLON 10 MG PO CAPS
10.0000 mg | ORAL_CAPSULE | Freq: Every evening | ORAL | 0 refills | Status: DC | PRN
Start: 2021-12-28 — End: 2022-03-28

## 2021-12-28 MED ORDER — MAGNESIUM SULFATE 2 GM/50ML IV SOLN
2.0000 g | Freq: Once | INTRAVENOUS | Status: AC
  Administered 2021-12-28: 2 g via INTRAVENOUS
  Filled 2021-12-28: qty 50

## 2021-12-28 NOTE — Progress Notes (Signed)
Patient presents today for treatment and follow up visit with Dr. Delton Coombes. Labs pending. Vital signs within parameters for treatment. Attestation 09-29-2021. Consent for tx 10/04/2021. ? ?Message received from A. Ouida Sills RN / Dr. Delton Coombes to proceed with treatment pending cbc/d results. CMP within parameters for treatment.  ? ?Labs reviewed and within parameters for treatment.  ? ?Patient voided 200 mls .  ? ?Treatment given today per MD orders. Tolerated infusion without adverse affects. Vital signs stable. No complaints at this time. Discharged from clinic ambulatory in stable condition. Alert and oriented x 3. F/U with [facility] as scheduled.   ?

## 2021-12-28 NOTE — Patient Instructions (Signed)
[place] [facility] ?Discharge Instructions ? ? ?You were seen and examined today by Dr. Delton Coombes. ? ?He reviewed the results of your scan which are good. The cancer has decreased.  ? ?We will proceed with your treatment today.  ? ?We sent a prescription for Sonata to help with your insomnia. ? ?We will increase the dose of your Mirapex. ? ?Return as scheduled.  ? ? ?Thank you for choosing [place] at [facility] to provide your oncology and hematology care.  To afford each patient quality time with our provider, please arrive at least 15 minutes before your scheduled appointment time.  ? ?If you have a lab appointment with the [facility] please come in thru the Main Entrance and check in at the main information desk. ? ?You need to re-schedule your appointment should you arrive 10 or more minutes late.  We strive to give you quality time with our providers, and arriving late affects you and other patients whose appointments are after yours.  Also, if you no show three or more times for appointments you may be dismissed from the clinic at the providers discretion.     ?Again, thank you for choosing [facility].  Our hope is that these requests will decrease the amount of time that you wait before being seen by our physicians.       ?_____________________________________________________________ ? ?Should you have questions after your visit to [facility], please contact our office at [phone] and follow the prompts.  Our office hours are 8:00 a.m. and 4:30 p.m. Monday - Friday.  Please note that voicemails left after 4:00 p.m. may not be returned until the following business day.  We are closed weekends and major holidays.  You do have access to a nurse 24-7, just call the main number to the clinic [phone] and do not press any options, hold on the line and a nurse will answer the phone.   ? ?For prescription refill  requests, have your pharmacy contact our office and allow 72 hours.   ? ?Due to Covid, you will need to wear a mask upon entering the hospital. If you do not have a mask, a mask will be given to you at the Main Entrance upon arrival. For doctor visits, patients may have 1 support person age 23 or older with them. For treatment visits, patients can not have anyone with them due to social distancing guidelines and our immunocompromised population.  ? ?   ?

## 2021-12-28 NOTE — Patient Instructions (Signed)
[facility]  Discharge Instructions: ?Thank you for choosing [facility] to provide your oncology and hematology care.  ?If you have a lab appointment with the [facility], please come in thru the Main Entrance and check in at the main information desk. ? ?Wear comfortable clothing and clothing appropriate for easy access to any Portacath or PICC line.  ? ?We strive to give you quality time with your provider. You may need to reschedule your appointment if you arrive late (15 or more minutes).  Arriving late affects you and other patients whose appointments are after yours.  Also, if you miss three or more appointments without notifying the office, you may be dismissed from the clinic at the provider?s discretion.    ?  ?For prescription refill requests, have your pharmacy contact our office and allow 72 hours for refills to be completed.   ? ?Today you received the following chemotherapy and/or immunotherapy agents Imfinzi, Gemzar, Ciplatin.     ?  ?To help prevent nausea and vomiting after your treatment, we encourage you to take your nausea medication as directed. ? ?BELOW ARE SYMPTOMS THAT SHOULD BE REPORTED IMMEDIATELY: ?*FEVER GREATER THAN 100.4 F (38 ?C) OR HIGHER ?*CHILLS OR SWEATING ?*NAUSEA AND VOMITING THAT IS NOT CONTROLLED WITH YOUR NAUSEA MEDICATION ?*UNUSUAL SHORTNESS OF BREATH ?*UNUSUAL BRUISING OR BLEEDING ?*URINARY PROBLEMS (pain or burning when urinating, or frequent urination) ?*BOWEL PROBLEMS (unusual diarrhea, constipation, pain near the anus) ?TENDERNESS IN MOUTH AND THROAT WITH OR WITHOUT PRESENCE OF ULCERS (sore throat, sores in mouth, or a toothache) ?UNUSUAL RASH, SWELLING OR PAIN  ?UNUSUAL VAGINAL DISCHARGE OR ITCHING  ? ?Items with * indicate a potential emergency and should be followed up as soon as possible or go to the Emergency Department if any problems should occur. ? ?Please show the CHEMOTHERAPY ALERT CARD or IMMUNOTHERAPY ALERT CARD at check-in to  the Emergency Department and triage nurse. ? ?Should you have questions after your visit or need to cancel or reschedule your appointment, please contact [facility] [phone]  and follow the prompts.  Office hours are 8:00 a.m. to 4:30 p.m. Monday - Friday. Please note that voicemails left after 4:00 p.m. may not be returned until the following business day.  We are closed weekends and major holidays. You have access to a nurse at all times for urgent questions. Please call the main number to the clinic [phone] and follow the prompts. ? ?For any non-urgent questions, you may also contact your provider using MyChart. We now offer e-Visits for anyone 59 and older to request care online for non-urgent symptoms. For details visit [email or web address]. ?  ?Also download the MyChart app! Go to the app store, search "MyChart", open the app, select [facility], and log in with your MyChart username and password. ? ?Due to Covid, a mask is required upon entering the hospital/clinic. If you do not have a mask, one will be given to you upon arrival. For doctor visits, patients may have 1 support person aged 87 or older with them. For treatment visits, patients cannot have anyone with them due to current Covid guidelines and our immunocompromised population.  ?

## 2021-12-28 NOTE — Progress Notes (Signed)
? ?[facility] ?[address] ?[place], [place] [address] ? ? ?CLINIC:  ?Medical Oncology/Hematology ? ?PCP:  ?Celene Squibb, MD ?[address] [place] [address] ?[phone] ? ? 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 ?PRIOR THERAPY: none ? ?NGS Results: No targetable mutations.  PD-L1 (WN462) negative.  MSI-stable.  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 ? ?CURRENT THERAPY: Cisplatin + Gemcitabine + Imfinzi D1,8 q21d ? 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 ?  ?10/05/2021 -  Chemotherapy  ? Patient is on Treatment Plan : BILIARY TRACT Cisplatin + Gemcitabine + Imfinzi D1,8 q21d  ? ?  ?  ? ? ?CANCER STAGING: ? 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 ? ?INTERVAL HISTORY:  ?[name], a 66 y.o. male, returns for routine follow-up and consideration for next cycle of chemotherapy. Marvin Carr was last seen on 12/07/2021. ? ?Due for cycle #5 of Cisplatin + Gemcitabine + Imfinzi today.  ? ?Overall, he tells me he has been feeling pretty well. He reports intermittent numbness in his toes and aching in his legs which occurs when sitting or lying in the bed for which he is taking Mirapex and Gabapentin; he has had this sensation for 20 years, but it has worsened over the past 5 days. He denies numbness in his hands. He reports trazodone did not improve his sleep. His appetite is good. His constipation is controlled.  ? ?Overall, he feels ready for next cycle of chemo today.  ? ? ?REVIEW OF SYSTEMS:  ?Review of Systems  ?Constitutional:  Negative for appetite change and fatigue.  ?Neurological:  Positive for numbness.  ?Psychiatric/Behavioral:  Positive for sleep disturbance.   ?All  other systems reviewed and are negative. ? ?PAST MEDICAL/SURGICAL HISTORY:  ?Past Medical History:  ?Diagnosis Date  ? Anxiety   ? Bladder cancer ([place]) 09/11/2009  ? CAD (coronary artery disease), native coronary artery   ? a. Mildly elevated troponin 03/2013, cath with nonobstructive disease including 50% AV groove distal stenosis before large OM  ? Essential hypertension   ? Headache(784.0)   ? History of migraines  ? Hyperglycemia   ? Mixed hyperlipidemia   ? Neuropathy   ? Obesity   ? Port-A-Cath in place 09/30/2021  ? Pre-diabetes   ? Seasonal allergies   ? Sleep apnea   ? On CPAP  ? ?Past Surgical History:  ?Procedure Laterality Date  ? BIOPSY  09/23/2021  ? Procedure: BIOPSY;  Surgeon: Harvel Quale, MD;  [facility];  Service: Gastroenterology;;  ? COLONOSCOPY  06/28/2011  ? Procedure: COLONOSCOPY;  Surgeon: Rogene Houston, MD;  [facility];  Service: Endoscopy;  Laterality: N/A;  ? COLONOSCOPY WITH PROPOFOL N/A 09/23/2021  ? Procedure: COLONOSCOPY WITH PROPOFOL;  Surgeon: Harvel Quale, MD;  [facility];  Service: Gastroenterology;  Laterality: N/A;  940  ? ESOPHAGOGASTRODUODENOSCOPY (EGD) WITH PROPOFOL N/A 09/23/2021  ? Procedure: ESOPHAGOGASTRODUODENOSCOPY (EGD) WITH PROPOFOL;  Surgeon: Harvel Quale, MD;  [facility];  Service: Gastroenterology;  Laterality: N/A;  ? IR IMAGING GUIDED PORT INSERTION  09/28/2021  ? IR PARACENTESIS  09/28/2021  ? JOINT REPLACEMENT Right   ? hip  ? LEFT HEART CATHETERIZATION  WITH CORONARY ANGIOGRAM N/A 03/31/2013  ? Procedure: LEFT HEART CATHETERIZATION WITH CORONARY ANGIOGRAM;  Surgeon: Minus Breeding, MD;  Location: [facility] C[facility] LAB;  Service: Cardiovascular;  Laterality: N/A;  ? POLYPECTOMY  09/23/2021  ? Procedure: POLYPECTOMY;  Surgeon: Harvel Quale, MD;  [facility];  Service: Gastroenterology;;  ? TURBT  09/11/2009  ? ? ?SOCIAL HISTORY:  ?Social History  ? ?Socioeconomic  History  ? Marital status: Divorced  ?  Spouse name: Not on file  ? Number of children: 0  ? Years of education: college  ? Highest education level: Not on file  ?Occupational History  ?  Employer: SELF-EMPLOYED  ?Tobacco Use  ? Smoking status: Former  ?  Packs/day: 0.50  ?  Years: 10.00  ?  Pack years: 5.00  ?  Types: Cigarettes  ?  Quit date: 07/10/2011  ?  Years since quitting: 10.4  ? Smokeless tobacco: Never  ? Tobacco comments:  ?  Quit several yrs prior to 03/2013.  ?Vaping Use  ? Vaping Use: Never used  ?Substance and Sexual Activity  ? Alcohol use: Yes  ?  Alcohol/week: 0.0 standard drinks  ?  Comment: Occasional  ? Drug use: No  ? Sexual activity: Not on file  ?Other Topics Concern  ? Not on file  ?Social History Narrative  ? Not on file  ? ?Social Determinants of Health  ? ?Financial Resource Strain: Not on file  ?Food Insecurity: Not on file  ?Transportation Needs: Not on file  ?Physical Activity: Not on file  ?Stress: Not on file  ?Social Connections: Not on file  ?Intimate Partner Violence: Not on file  ? ? ?FAMILY HISTORY:  ?Family History  ?Problem Relation Age of Onset  ? COPD Father   ? ? ?CURRENT MEDICATIONS:  ?Current Outpatient Medications  ?Medication Sig Dispense Refill  ? aspirin EC 81 MG tablet Take 81 mg by mouth daily.    ? buPROPion (WELLBUTRIN XL) 300 MG 24 hr tablet Take 300 mg by mouth daily.    ? CISPLATIN IV Inject into the vein once a week. Day 1, Day 8 q 21 days    ? cyclobenzaprine (FLEXERIL) 10 MG tablet Take 10 mg by mouth at bedtime as needed for muscle spasms.    ? Durvalumab (IMFINZI IV) Inject into the vein every 21 ( twenty-one) days.    ? famotidine (PEPCID) 20 MG tablet Take 1 tablet (20 mg total) by mouth at bedtime. 30 tablet 3  ? fenofibrate 160 MG tablet Take 160 mg by mouth daily.    ? FLUoxetine (PROZAC) 10 MG capsule Take 10 mg by mouth daily.    ? FLUoxetine (PROZAC) 10 MG tablet Take 10 mg by mouth daily.    ? gabapentin (NEURONTIN) 300 MG capsule Take 300 mg by  mouth 3 (three) times daily.    ? Gemcitabine HCl (GEMZAR IV) Inject into the vein once a week. Day 1, Day 8 q 21 days    ? HYDROcodone-acetaminophen (NORCO/VICODIN) 5-325 MG tablet Take 1 tablet by mouth every 4 (four) hours as needed for moderate pain. 30 tablet 0  ? Lactulose 20 GM/30ML SOLN Take 30 mLs (20 g total) by mouth daily. 450 mL 6  ? magnesium oxide (MAG-OX) 400 (240 Mg) MG tablet Take 1 tablet (400 mg total) by mouth 2 (two) times daily. 90 tablet 6  ? metFORMIN (GLUCOPHAGE-XR) 500 MG 24 hr tablet Take 500 mg by mouth 2 (two) times daily.    ?  olmesartan (BENICAR) 20 MG tablet Take 20 mg by mouth daily.    ? omeprazole (PRILOSEC) 40 MG capsule Take 40 mg by mouth daily.    ? pantoprazole (PROTONIX) 40 MG tablet Take 1 tablet (40 mg total) by mouth daily. 90 tablet 3  ? pramipexole (MIRAPEX) 0.5 MG tablet Take 0.5 mg by mouth 3 (three) times daily.     ? RYBELSUS 7 MG TABS Take 7 mg by mouth daily.    ? tamsulosin (FLOMAX) 0.4 MG CAPS capsule Take 0.4 mg by mouth daily.    ? traMADol (ULTRAM) 50 MG tablet Take 50 mg by mouth 4 (four) times daily as needed.    ? lidocaine-prilocaine (EMLA) cream Apply a small amount to port a cath site and cover with plastic wrap 1 hour prior to infusion appointments (Patient not taking: Reported on 12/07/2021) 30 g 3  ? nitroGLYCERIN (NITROSTAT) 0.4 MG SL tablet Place 1 tablet (0.4 mg total) under the tongue every 5 (five) minutes as needed for chest pain. (Patient not taking: Reported on 12/07/2021) 25 tablet 3  ? prochlorperazine (COMPAZINE) 10 MG tablet Take 1 tablet (10 mg total) by mouth every 6 (six) hours as needed (Nausea or vomiting). (Patient not taking: Reported on 12/07/2021) 30 tablet 1  ? traZODone (DESYREL) 100 MG tablet Take 1 tablet (100 mg total) by mouth at bedtime. (Patient not taking: Reported on 12/28/2021) 30 tablet 0  ? ?No current facility-administered medications for this visit.  ? ? ?ALLERGIES:  ?No Known Allergies ? ?PHYSICAL EXAM:  ?Performance  status (ECOG): 0 - Asymptomatic ? ?Vitals:  ? 12/28/21 0801  ?BP: (!) 155/86  ?Pulse: 75  ?Resp: 18  ?Temp: (!) 97.2 ?F (36.2 ?C)  ?SpO2: 97%  ? ?Wt Readings from Last 3 Encounters:  ?12/28/21 252 lb 9

## 2021-12-28 NOTE — Progress Notes (Signed)
Patient has been examined by Dr. Katragadda, and vital signs and labs have been reviewed. ANC, Creatinine, LFTs, hemoglobin, and platelets are within treatment parameters per M.D. - pt may proceed with treatment.    °

## 2021-12-30 ENCOUNTER — Encounter (HOSPITAL_COMMUNITY): Payer: Self-pay

## 2022-01-04 ENCOUNTER — Inpatient Hospital Stay (HOSPITAL_COMMUNITY): Payer: Medicare Other

## 2022-01-04 VITALS — BP 136/67 | HR 74 | Temp 97.8°F | Resp 18

## 2022-01-04 DIAGNOSIS — C787 Secondary malignant neoplasm of liver and intrahepatic bile duct: Secondary | ICD-10-CM

## 2022-01-04 DIAGNOSIS — Z95828 Presence of other vascular implants and grafts: Secondary | ICD-10-CM

## 2022-01-04 DIAGNOSIS — D649 Anemia, unspecified: Secondary | ICD-10-CM

## 2022-01-04 DIAGNOSIS — Z5112 Encounter for antineoplastic immunotherapy: Secondary | ICD-10-CM | POA: Diagnosis not present

## 2022-01-04 DIAGNOSIS — Z08 Encounter for follow-up examination after completed treatment for malignant neoplasm: Secondary | ICD-10-CM

## 2022-01-04 LAB — COMPREHENSIVE METABOLIC PANEL
ALT: 19 U/L (ref 0–44)
AST: 22 U/L (ref 15–41)
Albumin: 3.8 g/dL (ref 3.5–5.0)
Alkaline Phosphatase: 74 U/L (ref 38–126)
Anion gap: 7 (ref 5–15)
BUN: 30 mg/dL — ABNORMAL HIGH (ref 8–23)
CO2: 26 mmol/L (ref 22–32)
Calcium: 9.3 mg/dL (ref 8.9–10.3)
Chloride: 103 mmol/L (ref 98–111)
Creatinine, Ser: 1.15 mg/dL (ref 0.61–1.24)
GFR, Estimated: >60 mL/min (ref >60)
Glucose, Bld: 106 mg/dL — ABNORMAL HIGH (ref 70–99)
Potassium: 4.2 mmol/L (ref 3.5–5.1)
Sodium: 136 mmol/L (ref 135–145)
Total Bilirubin: 0.4 mg/dL (ref 0.3–1.2)
Total Protein: 6.4 g/dL — ABNORMAL LOW (ref 6.5–8.1)

## 2022-01-04 LAB — CBC WITH DIFFERENTIAL/PLATELET
Abs Immature Granulocytes: 0.02 10*3/uL (ref 0.00–0.07)
Basophils Absolute: 0 10*3/uL (ref 0.0–0.1)
Basophils Relative: 1 %
Eosinophils Absolute: 0.1 10*3/uL (ref 0.0–0.5)
Eosinophils Relative: 2 %
HCT: 26.2 % — ABNORMAL LOW (ref 39.0–52.0)
Hemoglobin: 8.8 g/dL — ABNORMAL LOW (ref 13.0–17.0)
Immature Granulocytes: 1 %
Lymphocytes Relative: 38 %
Lymphs Abs: 1 10*3/uL (ref 0.7–4.0)
MCH: 32.1 pg (ref 26.0–34.0)
MCHC: 33.6 g/dL (ref 30.0–36.0)
MCV: 95.6 fL (ref 80.0–100.0)
Monocytes Absolute: 0.4 10*3/uL (ref 0.1–1.0)
Monocytes Relative: 14 %
Neutro Abs: 1.1 10*3/uL — ABNORMAL LOW (ref 1.7–7.7)
Neutrophils Relative %: 44 %
Platelets: 225 10*3/uL (ref 150–400)
RBC: 2.74 MIL/uL — ABNORMAL LOW (ref 4.22–5.81)
WBC: 2.6 10*3/uL — ABNORMAL LOW (ref 4.0–10.5)
nRBC: 0 % (ref 0.0–0.2)

## 2022-01-04 LAB — IRON AND TIBC
Iron: 75 ug/dL (ref 45–182)
Saturation Ratios: 16 % — ABNORMAL LOW (ref 17.9–39.5)
TIBC: 457 ug/dL — ABNORMAL HIGH (ref 250–450)
UIBC: 382 ug/dL

## 2022-01-04 LAB — VITAMIN B12: Vitamin B-12: 2800 pg/mL — ABNORMAL HIGH (ref 180–914)

## 2022-01-04 LAB — MAGNESIUM: Magnesium: 1.7 mg/dL (ref 1.7–2.4)

## 2022-01-04 LAB — FOLATE: Folate: 23.8 ng/mL (ref >5.9)

## 2022-01-04 LAB — FERRITIN: Ferritin: 522 ng/mL — ABNORMAL HIGH (ref 24–336)

## 2022-01-04 MED ORDER — SODIUM CHLORIDE 0.9 % IV SOLN
25.0000 mg/m2 | Freq: Once | INTRAVENOUS | Status: AC
  Administered 2022-01-04: 62 mg via INTRAVENOUS
  Filled 2022-01-04: qty 62

## 2022-01-04 MED ORDER — SODIUM CHLORIDE 0.9 % IV SOLN
1000.0000 mg/m2 | Freq: Once | INTRAVENOUS | Status: AC
  Administered 2022-01-04: 2470 mg via INTRAVENOUS
  Filled 2022-01-04: qty 52.6

## 2022-01-04 MED ORDER — MAGNESIUM SULFATE 2 GM/50ML IV SOLN
2.0000 g | Freq: Once | INTRAVENOUS | Status: AC
  Administered 2022-01-04: 2 g via INTRAVENOUS
  Filled 2022-01-04: qty 50

## 2022-01-04 MED ORDER — SODIUM CHLORIDE 0.9% FLUSH
10.0000 mL | INTRAVENOUS | Status: DC | PRN
  Administered 2022-01-04: 10 mL

## 2022-01-04 MED ORDER — SODIUM CHLORIDE 0.9 % IV SOLN
10.0000 mg | Freq: Once | INTRAVENOUS | Status: AC
  Administered 2022-01-04: 10 mg via INTRAVENOUS
  Filled 2022-01-04: qty 10

## 2022-01-04 MED ORDER — POTASSIUM CHLORIDE IN NACL 20-0.9 MEQ/L-% IV SOLN
Freq: Once | INTRAVENOUS | Status: AC
  Filled 2022-01-04: qty 1000

## 2022-01-04 MED ORDER — SODIUM CHLORIDE 0.9 % IV SOLN
150.0000 mg | Freq: Once | INTRAVENOUS | Status: AC
  Administered 2022-01-04: 150 mg via INTRAVENOUS
  Filled 2022-01-04: qty 150

## 2022-01-04 MED ORDER — PALONOSETRON HCL INJECTION 0.25 MG/5ML
0.2500 mg | Freq: Once | INTRAVENOUS | Status: AC
  Administered 2022-01-04: 0.25 mg via INTRAVENOUS
  Filled 2022-01-04: qty 5

## 2022-01-04 MED ORDER — SODIUM CHLORIDE 0.9 % IV SOLN: Freq: Once | INTRAVENOUS | Status: AC

## 2022-01-04 MED ORDER — HEPARIN SOD (PORK) LOCK FLUSH 100 UNIT/ML IV SOLN
500.0000 [IU] | Freq: Once | INTRAVENOUS | Status: AC | PRN
  Administered 2022-01-04: 500 [IU]

## 2022-01-04 NOTE — Progress Notes (Signed)
Patients port flushed without difficulty.  Good blood return noted with no bruising or swelling noted at site.  Patient remains accessed for chemotherapy treatment.  

## 2022-01-04 NOTE — Progress Notes (Signed)
Patient presents today for Gemzar/Cisplatin infusion per providers order.  Vital signs within parameters for treatment.  Labs pending.  Patient has no new complaints at this time. ? ?[place] noted to be 1.1, MD notified.  Message received from Dr. Delton Coombes patient okay for treatment. ? ?Patient has voided 500 cc. ? ?Gemzar /Cisplatin given today per MD orders.  Stable during infusion without adverse affects.  Vital signs stable.  No complaints at this time.  Discharge from clinic ambulatory in stable condition.  Alert and oriented X 3.  Follow up with [facility] as scheduled.  ?

## 2022-01-04 NOTE — Patient Instructions (Signed)
[facility]  Discharge Instructions: ?Thank you for choosing [facility] to provide your oncology and hematology care.  ?If you have a lab appointment with the [facility], please come in thru the Main Entrance and check in at the main information desk. ? ?Wear comfortable clothing and clothing appropriate for easy access to any Portacath or PICC line.  ? ?We strive to give you quality time with your provider. You may need to reschedule your appointment if you arrive late (15 or more minutes).  Arriving late affects you and other patients whose appointments are after yours.  Also, if you miss three or more appointments without notifying the office, you may be dismissed from the clinic at the provider?s discretion.    ?  ?For prescription refill requests, have your pharmacy contact our office and allow 72 hours for refills to be completed.   ? ?Today you received the following chemotherapy and/or immunotherapy agents Gemzar/Cisplatin    ?  ?To help prevent nausea and vomiting after your treatment, we encourage you to take your nausea medication as directed. ? ?BELOW ARE SYMPTOMS THAT SHOULD BE REPORTED IMMEDIATELY: ?*FEVER GREATER THAN 100.4 F (38 ?C) OR HIGHER ?*CHILLS OR SWEATING ?*NAUSEA AND VOMITING THAT IS NOT CONTROLLED WITH YOUR NAUSEA MEDICATION ?*UNUSUAL SHORTNESS OF BREATH ?*UNUSUAL BRUISING OR BLEEDING ?*URINARY PROBLEMS (pain or burning when urinating, or frequent urination) ?*BOWEL PROBLEMS (unusual diarrhea, constipation, pain near the anus) ?TENDERNESS IN MOUTH AND THROAT WITH OR WITHOUT PRESENCE OF ULCERS (sore throat, sores in mouth, or a toothache) ?UNUSUAL RASH, SWELLING OR PAIN  ?UNUSUAL VAGINAL DISCHARGE OR ITCHING  ? ?Items with * indicate a potential emergency and should be followed up as soon as possible or go to the Emergency Department if any problems should occur. ? ?Please show the CHEMOTHERAPY ALERT CARD or IMMUNOTHERAPY ALERT CARD at check-in to the Emergency  Department and triage nurse. ? ?Should you have questions after your visit or need to cancel or reschedule your appointment, please contact [facility] [phone]  and follow the prompts.  Office hours are 8:00 a.m. to 4:30 p.m. Monday - Friday. Please note that voicemails left after 4:00 p.m. may not be returned until the following business day.  We are closed weekends and major holidays. You have access to a nurse at all times for urgent questions. Please call the main number to the clinic [phone] and follow the prompts. ? ?For any non-urgent questions, you may also contact your provider using MyChart. We now offer e-Visits for anyone 62 and older to request care online for non-urgent symptoms. For details visit [email or web address]. ?  ?Also download the MyChart app! Go to the app store, search "MyChart", open the app, select [facility], and log in with your MyChart username and password. ? ?Due to Covid, a mask is required upon entering the hospital/clinic. If you do not have a mask, one will be given to you upon arrival. For doctor visits, patients may have 1 support person aged 80 or older with them. For treatment visits, patients cannot have anyone with them due to current Covid guidelines and our immunocompromised population.  ?

## 2022-01-05 LAB — CEA: CEA: 3 ng/mL (ref 0.0–4.7)

## 2022-01-06 ENCOUNTER — Inpatient Hospital Stay (HOSPITAL_COMMUNITY): Payer: Medicare Other

## 2022-01-06 VITALS — BP 141/75 | HR 74 | Temp 98.0°F | Resp 18

## 2022-01-06 DIAGNOSIS — C221 Intrahepatic bile duct carcinoma: Secondary | ICD-10-CM

## 2022-01-06 DIAGNOSIS — Z95828 Presence of other vascular implants and grafts: Secondary | ICD-10-CM

## 2022-01-06 DIAGNOSIS — Z5112 Encounter for antineoplastic immunotherapy: Secondary | ICD-10-CM | POA: Diagnosis not present

## 2022-01-06 MED ORDER — PEGFILGRASTIM-CBQV 6 MG/0.6ML ~~LOC~~ SOSY
6.0000 mg | PREFILLED_SYRINGE | Freq: Once | SUBCUTANEOUS | Status: AC
  Administered 2022-01-06: 6 mg via SUBCUTANEOUS
  Filled 2022-01-06: qty 0.6

## 2022-01-06 NOTE — Patient Instructions (Signed)
[facility]  Discharge Instructions: °Thank you for choosing [facility] to provide your oncology and hematology care.  °If you have a lab appointment with the Cancer Center, please come in thru the Main Entrance and check in at the main information desk. ° °Wear comfortable clothing and clothing appropriate for easy access to any Portacath or PICC line.  ° °We strive to give you quality time with your provider. You may need to reschedule your appointment if you arrive late (15 or more minutes).  Arriving late affects you and other patients whose appointments are after yours.  Also, if you miss three or more appointments without notifying the office, you may be dismissed from the clinic at the provider’s discretion.    °  °For prescription refill requests, have your pharmacy contact our office and allow 72 hours for refills to be completed.   ° °Today you received Udenyca ° ° °BELOW ARE SYMPTOMS THAT SHOULD BE REPORTED IMMEDIATELY: °*FEVER GREATER THAN 100.4 F (38 °C) OR HIGHER °*CHILLS OR SWEATING °*NAUSEA AND VOMITING THAT IS NOT CONTROLLED WITH YOUR NAUSEA MEDICATION °*UNUSUAL SHORTNESS OF BREATH °*UNUSUAL BRUISING OR BLEEDING °*URINARY PROBLEMS (pain or burning when urinating, or frequent urination) °*BOWEL PROBLEMS (unusual diarrhea, constipation, pain near the anus) °TENDERNESS IN MOUTH AND THROAT WITH OR WITHOUT PRESENCE OF ULCERS (sore throat, sores in mouth, or a toothache) °UNUSUAL RASH, SWELLING OR PAIN  °UNUSUAL VAGINAL DISCHARGE OR ITCHING  ° °Items with * indicate a potential emergency and should be followed up as soon as possible or go to the Emergency Department if any problems should occur. ° °Please show the CHEMOTHERAPY ALERT CARD or IMMUNOTHERAPY ALERT CARD at check-in to the Emergency Department and triage nurse. ° °Should you have questions after your visit or need to cancel or reschedule your appointment, please contact [facility] [phone]  and  follow the prompts.  Office hours are 8:00 a.m. to 4:30 p.m. Monday - Friday. Please note that voicemails left after 4:00 p.m. may not be returned until the following business day.  We are closed weekends and major holidays. You have access to a nurse at all times for urgent questions. Please call the main number to the clinic [phone] and follow the prompts. ° °For any non-urgent questions, you may also contact your provider using MyChart. We now offer e-Visits for anyone 18 and older to request care online for non-urgent symptoms. For details visit [email or web address]. °  °Also download the MyChart app! Go to the app store, search "MyChart", open the app, select [facility], and log in with your MyChart username and password. ° °Due to Covid, a mask is required upon entering the hospital/clinic. If you do not have a mask, one will be given to you upon arrival. For doctor visits, patients may have 1 support person aged 18 or older with them. For treatment visits, patients cannot have anyone with them due to current Covid guidelines and our immunocompromised population.  °

## 2022-01-06 NOTE — Progress Notes (Signed)
Marvin Carr presents today for injection per the provider's orders.  Udenyca administration without incident; injection site WNL; see MAR for injection details.  Patient tolerated procedure well and without incident.  No questions or complaints noted at this time.  ? ?Discharged from clinic ambulatory in stable condition. Alert and oriented x 3. F/U with [facility] as scheduled.   ?

## 2022-01-25 ENCOUNTER — Inpatient Hospital Stay (HOSPITAL_COMMUNITY): Payer: Medicare Other | Attending: Hematology

## 2022-01-25 ENCOUNTER — Inpatient Hospital Stay (HOSPITAL_BASED_OUTPATIENT_CLINIC_OR_DEPARTMENT_OTHER): Payer: Medicare Other | Admitting: Hematology

## 2022-01-25 ENCOUNTER — Inpatient Hospital Stay (HOSPITAL_COMMUNITY): Payer: Medicare Other

## 2022-01-25 ENCOUNTER — Encounter (HOSPITAL_COMMUNITY): Payer: Self-pay | Admitting: Hematology

## 2022-01-25 VITALS — BP 158/87 | HR 76 | Temp 97.9°F | Resp 20

## 2022-01-25 DIAGNOSIS — Z8551 Personal history of malignant neoplasm of bladder: Secondary | ICD-10-CM | POA: Insufficient documentation

## 2022-01-25 DIAGNOSIS — D649 Anemia, unspecified: Secondary | ICD-10-CM

## 2022-01-25 DIAGNOSIS — Z808 Family history of malignant neoplasm of other organs or systems: Secondary | ICD-10-CM | POA: Insufficient documentation

## 2022-01-25 DIAGNOSIS — G4700 Insomnia, unspecified: Secondary | ICD-10-CM | POA: Diagnosis not present

## 2022-01-25 DIAGNOSIS — Z5112 Encounter for antineoplastic immunotherapy: Secondary | ICD-10-CM | POA: Diagnosis present

## 2022-01-25 DIAGNOSIS — Z87891 Personal history of nicotine dependence: Secondary | ICD-10-CM | POA: Insufficient documentation

## 2022-01-25 DIAGNOSIS — G629 Polyneuropathy, unspecified: Secondary | ICD-10-CM | POA: Diagnosis not present

## 2022-01-25 DIAGNOSIS — Z8042 Family history of malignant neoplasm of prostate: Secondary | ICD-10-CM | POA: Diagnosis not present

## 2022-01-25 DIAGNOSIS — Z809 Family history of malignant neoplasm, unspecified: Secondary | ICD-10-CM | POA: Diagnosis not present

## 2022-01-25 DIAGNOSIS — Z5111 Encounter for antineoplastic chemotherapy: Secondary | ICD-10-CM | POA: Insufficient documentation

## 2022-01-25 DIAGNOSIS — C786 Secondary malignant neoplasm of retroperitoneum and peritoneum: Secondary | ICD-10-CM | POA: Diagnosis not present

## 2022-01-25 DIAGNOSIS — C221 Intrahepatic bile duct carcinoma: Secondary | ICD-10-CM | POA: Diagnosis present

## 2022-01-25 DIAGNOSIS — Z95828 Presence of other vascular implants and grafts: Secondary | ICD-10-CM

## 2022-01-25 DIAGNOSIS — C787 Secondary malignant neoplasm of liver and intrahepatic bile duct: Secondary | ICD-10-CM | POA: Diagnosis not present

## 2022-01-25 DIAGNOSIS — R180 Malignant ascites: Secondary | ICD-10-CM | POA: Diagnosis not present

## 2022-01-25 DIAGNOSIS — Z5189 Encounter for other specified aftercare: Secondary | ICD-10-CM | POA: Diagnosis not present

## 2022-01-25 DIAGNOSIS — Z79899 Other long term (current) drug therapy: Secondary | ICD-10-CM | POA: Insufficient documentation

## 2022-01-25 LAB — CBC WITH DIFFERENTIAL/PLATELET
Abs Immature Granulocytes: 0.02 10*3/uL (ref 0.00–0.07)
Basophils Absolute: 0.1 10*3/uL (ref 0.0–0.1)
Basophils Relative: 1 %
Eosinophils Absolute: 0.1 10*3/uL (ref 0.0–0.5)
Eosinophils Relative: 3 %
HCT: 30.5 % — ABNORMAL LOW (ref 39.0–52.0)
Hemoglobin: 9.9 g/dL — ABNORMAL LOW (ref 13.0–17.0)
Immature Granulocytes: 0 %
Lymphocytes Relative: 24 %
Lymphs Abs: 1.1 10*3/uL (ref 0.7–4.0)
MCH: 33.8 pg (ref 26.0–34.0)
MCHC: 32.5 g/dL (ref 30.0–36.0)
MCV: 104.1 fL — ABNORMAL HIGH (ref 80.0–100.0)
Monocytes Absolute: 0.7 10*3/uL (ref 0.1–1.0)
Monocytes Relative: 14 %
Neutro Abs: 2.8 10*3/uL (ref 1.7–7.7)
Neutrophils Relative %: 58 %
Platelets: 266 10*3/uL (ref 150–400)
RBC: 2.93 MIL/uL — ABNORMAL LOW (ref 4.22–5.81)
RDW: 16.6 % — ABNORMAL HIGH (ref 11.5–15.5)
WBC: 4.8 10*3/uL (ref 4.0–10.5)
nRBC: 0 % (ref 0.0–0.2)

## 2022-01-25 LAB — COMPREHENSIVE METABOLIC PANEL
ALT: 14 U/L (ref 0–44)
AST: 18 U/L (ref 15–41)
Albumin: 3.9 g/dL (ref 3.5–5.0)
Alkaline Phosphatase: 82 U/L (ref 38–126)
Anion gap: 6 (ref 5–15)
BUN: 31 mg/dL — ABNORMAL HIGH (ref 8–23)
CO2: 27 mmol/L (ref 22–32)
Calcium: 9.4 mg/dL (ref 8.9–10.3)
Chloride: 104 mmol/L (ref 98–111)
Creatinine, Ser: 1.08 mg/dL (ref 0.61–1.24)
GFR, Estimated: >60 mL/min (ref >60)
Glucose, Bld: 118 mg/dL — ABNORMAL HIGH (ref 70–99)
Potassium: 3.9 mmol/L (ref 3.5–5.1)
Sodium: 137 mmol/L (ref 135–145)
Total Bilirubin: 0.7 mg/dL (ref 0.3–1.2)
Total Protein: 6.2 g/dL — ABNORMAL LOW (ref 6.5–8.1)

## 2022-01-25 LAB — TSH: TSH: 2.825 u[IU]/mL (ref 0.350–4.500)

## 2022-01-25 LAB — MAGNESIUM: Magnesium: 1.7 mg/dL (ref 1.7–2.4)

## 2022-01-25 MED ORDER — SODIUM CHLORIDE 0.9 % IV SOLN
1000.0000 mg/m2 | Freq: Once | INTRAVENOUS | Status: AC
  Administered 2022-01-25: 2470 mg via INTRAVENOUS
  Filled 2022-01-25: qty 64.96

## 2022-01-25 MED ORDER — SODIUM CHLORIDE 0.9 % IV SOLN
150.0000 mg | Freq: Once | INTRAVENOUS | Status: AC
  Administered 2022-01-25: 150 mg via INTRAVENOUS
  Filled 2022-01-25: qty 150

## 2022-01-25 MED ORDER — HEPARIN SOD (PORK) LOCK FLUSH 100 UNIT/ML IV SOLN
500.0000 [IU] | Freq: Once | INTRAVENOUS | Status: AC | PRN
  Administered 2022-01-25: 500 [IU]

## 2022-01-25 MED ORDER — SODIUM CHLORIDE 0.9 % IV SOLN
10.0000 mg | Freq: Once | INTRAVENOUS | Status: AC
  Administered 2022-01-25: 10 mg via INTRAVENOUS
  Filled 2022-01-25: qty 10

## 2022-01-25 MED ORDER — SODIUM CHLORIDE 0.9% FLUSH
10.0000 mL | INTRAVENOUS | Status: DC | PRN
  Administered 2022-01-25: 10 mL

## 2022-01-25 MED ORDER — SODIUM CHLORIDE 0.9 % IV SOLN: Freq: Once | INTRAVENOUS | Status: AC

## 2022-01-25 MED ORDER — POTASSIUM CHLORIDE IN NACL 20-0.9 MEQ/L-% IV SOLN
Freq: Once | INTRAVENOUS | Status: AC
  Filled 2022-01-25: qty 1000

## 2022-01-25 MED ORDER — PALONOSETRON HCL INJECTION 0.25 MG/5ML
0.2500 mg | Freq: Once | INTRAVENOUS | Status: AC
  Administered 2022-01-25: 0.25 mg via INTRAVENOUS
  Filled 2022-01-25: qty 5

## 2022-01-25 MED ORDER — SODIUM CHLORIDE 0.9 % IV SOLN
25.0000 mg/m2 | Freq: Once | INTRAVENOUS | Status: AC
  Administered 2022-01-25: 62 mg via INTRAVENOUS
  Filled 2022-01-25: qty 62

## 2022-01-25 MED ORDER — MAGNESIUM SULFATE 2 GM/50ML IV SOLN
2.0000 g | Freq: Once | INTRAVENOUS | Status: AC
  Administered 2022-01-25: 2 g via INTRAVENOUS
  Filled 2022-01-25: qty 50

## 2022-01-25 MED ORDER — SODIUM CHLORIDE 0.9 % IV SOLN
1500.0000 mg | Freq: Once | INTRAVENOUS | Status: AC
  Administered 2022-01-25: 1500 mg via INTRAVENOUS
  Filled 2022-01-25: qty 30

## 2022-01-25 NOTE — Progress Notes (Signed)
? ?Marvin Carr ?[address] ?[place], [place] [address] ? ? ?CLINIC:  ?Medical Oncology/Hematology ? ?PCP:  ?Celene Squibb, MD ?[address] [place] [address] ?[phone] ? ? 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 ?PRIOR THERAPY: none ? ?NGS Results: No targetable mutations.  PD-L1 (TD428) negative.  MSI-stable.  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 ? ?CURRENT THERAPY: Cisplatin + Gemcitabine + Imfinzi D1,8 q21d ? 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 ?  ?10/05/2021 -  Chemotherapy  ? Patient is on Treatment Plan : BILIARY TRACT Cisplatin + Gemcitabine + Imfinzi D1,8 q21d  ? ?   ? ? ?CANCER STAGING: ? 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 ? ?INTERVAL HISTORY:  ?Mr. [name], a 66 y.o. male, returns for routine follow-up and consideration for next cycle of chemotherapy. Holman was last seen on 12/28/2021. ? ?Due for cycle #6 of Cisplatin + Gemcitabine + Imfinzi today.  ? ?Overall, he tells me he has been feeling pretty well. He denies nausea, vomiting, and initis. The tingling in his toes is stable. His sleeping has improved. His appetite and taste are good.  ? ?Overall, he feels ready for next cycle of chemo today.  ? ? ?REVIEW OF SYSTEMS:  ?Review of Systems  ?Constitutional:  Negative for appetite change and fatigue.  ?HENT:   Negative for tinnitus.   ?Gastrointestinal:  Negative for nausea and vomiting.  ?Neurological:  Positive for headaches and numbness.  ?Psychiatric/Behavioral:  Positive for sleep disturbance (improving).   ?All other systems reviewed and are negative. ? ?PAST MEDICAL/SURGICAL HISTORY:  ?Past Medical History:  ?Diagnosis Date  ? Anxiety    ? Bladder cancer ([place]) 09/11/2009  ? CAD (coronary artery disease), native coronary artery   ? a. Mildly elevated troponin 03/2013, cath with nonobstructive disease including 50% AV groove distal stenosis before large OM  ? Essential hypertension   ? Headache(784.0)   ? History of migraines  ? Hyperglycemia   ? Mixed hyperlipidemia   ? Neuropathy   ? Obesity   ? Port-A-Cath in place 09/30/2021  ? Pre-diabetes   ? Seasonal allergies   ? Sleep apnea   ? On CPAP  ? ?Past Surgical History:  ?Procedure Laterality Date  ? BIOPSY  09/23/2021  ? Procedure: BIOPSY;  Surgeon: Harvel Quale, MD;  [facility];  Service: Gastroenterology;;  ? COLONOSCOPY  06/28/2011  ? Procedure: COLONOSCOPY;  Surgeon: Rogene Houston, MD;  [facility];  Service: Endoscopy;  Laterality: N/A;  ? COLONOSCOPY WITH PROPOFOL N/A 09/23/2021  ? Procedure: COLONOSCOPY WITH PROPOFOL;  Surgeon: Harvel Quale, MD;  [facility];  Service: Gastroenterology;  Laterality: N/A;  940  ? ESOPHAGOGASTRODUODENOSCOPY (EGD) WITH PROPOFOL N/A 09/23/2021  ? Procedure: ESOPHAGOGASTRODUODENOSCOPY (EGD) WITH PROPOFOL;  Surgeon: Harvel Quale, MD;  [facility];  Service: Gastroenterology;  Laterality: N/A;  ? IR IMAGING GUIDED PORT INSERTION  09/28/2021  ? IR PARACENTESIS  09/28/2021  ? JOINT REPLACEMENT Right   ? hip  ? LEFT HEART CATHETERIZATION WITH CORONARY ANGIOGRAM N/A 03/31/2013  ? Procedure: LEFT HEART CATHETERIZATION WITH CORONARY ANGIOGRAM;  Surgeon: Minus Breeding, MD;  Location: [facility] CATH LAB;  Service: Cardiovascular;  Laterality: N/A;  ? POLYPECTOMY  09/23/2021  ? Procedure: POLYPECTOMY;  Surgeon: Harvel Quale, MD;  [facility];  Service: Gastroenterology;;  ? TURBT  09/11/2009  ? ? ?SOCIAL HISTORY:  ?Social History  ? ?Socioeconomic History  ? Marital status: Divorced  ?  Spouse name: Not on file  ? Number of children: 0  ? Years of education: college  ?  Highest education level: Not on file  ?Occupational History  ?  Employer: SELF-EMPLOYED  ?Tobacco Use  ? Smoking status: Former  ?  Packs/day: 0.50  ?  Years: 10.00  ?  Pack years: 5.00  ?  Types: Cigarettes  ?  Quit date: 07/10/2011  ?  Years since quitting: 10.5  ? Smokeless tobacco: Never  ? Tobacco comments:  ?  Quit several yrs prior to 03/2013.  ?Vaping Use  ? Vaping Use: Never used  ?Substance and Sexual Activity  ? Alcohol use: Yes  ?  Alcohol/week: 0.0 standard drinks  ?  Comment: Occasional  ? Drug use: No  ? Sexual activity: Not on file  ?Other Topics Concern  ? Not on file  ?Social History Narrative  ? Not on file  ? ?Social Determinants of Health  ? ?Financial Resource Strain: Not on file  ?Food Insecurity: Not on file  ?Transportation Needs: Not on file  ?Physical Activity: Not on file  ?Stress: Not on file  ?Social Connections: Not on file  ?Intimate Partner Violence: Not on file  ? ? ?FAMILY HISTORY:  ?Family History  ?Problem Relation Age of Onset  ? COPD Father   ? ? ?CURRENT MEDICATIONS:  ?Current Outpatient Medications  ?Medication Sig Dispense Refill  ? aspirin EC 81 MG tablet Take 81 mg by mouth daily.    ? buPROPion (WELLBUTRIN XL) 300 MG 24 hr tablet Take 300 mg by mouth daily.    ? CISPLATIN IV Inject into the vein once a week. Day 1, Day 8 q 21 days    ? cyclobenzaprine (FLEXERIL) 10 MG tablet Take 10 mg by mouth at bedtime as needed for muscle spasms.    ? Durvalumab (IMFINZI IV) Inject into the vein every 21 ( twenty-one) days.    ? Eszopiclone 3 MG TABS Take 3 mg by mouth at bedtime. Take immediately before bedtime    ? famotidine (PEPCID) 20 MG tablet Take 1 tablet (20 mg total) by mouth at bedtime. 30 tablet 3  ? fenofibrate 160 MG tablet Take 160 mg by mouth daily.    ? FLUoxetine (PROZAC) 10 MG capsule Take 10 mg by mouth daily.    ? FLUoxetine (PROZAC) 10 MG tablet Take 10 mg by mouth daily.    ? gabapentin (NEURONTIN) 300 MG capsule Take 300 mg by mouth 3 (three) times daily.    ?  Gemcitabine HCl (GEMZAR IV) Inject into the vein once a week. Day 1, Day 8 q 21 days    ? HYDROcodone-acetaminophen (NORCO/VICODIN) 5-325 MG tablet Take 1 tablet by mouth every 4 (four) hours as needed for moderate pain. 30 tablet 0  ? Lactulose 20 GM/30ML SOLN Take 30 mLs (20 g total) by mouth daily. 450 mL 6  ? lidocaine-prilocaine (EMLA) cream Apply a small amount to port a cath site and cover with plastic wrap 1 hour prior to infusion appointments 30 g 3  ? magnesium oxide (MAG-OX) 400 (240 Mg) MG tablet Take 1 tablet (400 mg total) by mouth 2 (two) times daily. 90 tablet 6  ? metFORMIN (  GLUCOPHAGE-XR) 500 MG 24 hr tablet Take 500 mg by mouth 2 (two) times daily.    ? nitroGLYCERIN (NITROSTAT) 0.4 MG SL tablet Place 1 tablet (0.4 mg total) under the tongue every 5 (five) minutes as needed for chest pain. 25 tablet 3  ? olmesartan (BENICAR) 20 MG tablet Take 20 mg by mouth daily.    ? omeprazole (PRILOSEC) 40 MG capsule Take 40 mg by mouth daily.    ? pantoprazole (PROTONIX) 40 MG tablet Take 1 tablet (40 mg total) by mouth daily. 90 tablet 3  ? pramipexole (MIRAPEX) 0.75 MG tablet Take 1 tablet (0.75 mg total) by mouth 3 (three) times daily. 90 tablet 4  ? prochlorperazine (COMPAZINE) 10 MG tablet Take 1 tablet (10 mg total) by mouth every 6 (six) hours as needed (Nausea or vomiting). 30 tablet 1  ? RYBELSUS 7 MG TABS Take 7 mg by mouth daily.    ? tamsulosin (FLOMAX) 0.4 MG CAPS capsule Take 0.4 mg by mouth daily.    ? traMADol (ULTRAM) 50 MG tablet Take 50 mg by mouth 4 (four) times daily as needed.    ? traZODone (DESYREL) 100 MG tablet Take 1 tablet (100 mg total) by mouth at bedtime. 30 tablet 0  ? zaleplon (SONATA) 10 MG capsule Take 1 capsule (10 mg total) by mouth at bedtime as needed for sleep. 30 capsule 0  ? ?No current facility-administered medications for this visit.  ? ? ?ALLERGIES:  ?No Known Allergies ? ?PHYSICAL EXAM:  ?Performance status (ECOG): 0 - Asymptomatic ? ?There were no vitals filed  for this visit. ?Wt Readings from Last 3 Encounters:  ?01/25/22 257 lb 6.4 oz (116.8 kg)  ?01/04/22 250 lb 3.2 oz (113.5 kg)  ?12/28/21 252 lb 9.6 oz (114.6 kg)  ? ?Physical Exam ?Vitals reviewed.  ?[place]

## 2022-01-25 NOTE — Patient Instructions (Signed)
[facility] Discharge Instructions   You were seen and examined today by Dr. Katragadda.  He reviewed the results of your lab work today which are normal/stable.   We will proceed with your treatment today.  Return as scheduled.    Thank you for choosing [facility] to provide your oncology and hematology care.  To afford each patient quality time with our provider, please arrive at least 15 minutes before your scheduled appointment time.   If you have a lab appointment with the Cancer Center please come in thru the Main Entrance and check in at the main information desk.  You need to re-schedule your appointment should you arrive 10 or more minutes late.  We strive to give you quality time with our providers, and arriving late affects you and other patients whose appointments are after yours.  Also, if you no show three or more times for appointments you may be dismissed from the clinic at the providers discretion.     Again, thank you for choosing [facility].  Our hope is that these requests will decrease the amount of time that you wait before being seen by our physicians.       _____________________________________________________________  Should you have questions after your visit to [facility], please contact our office at [phone] and follow the prompts.  Our office hours are 8:00 a.m. and 4:30 p.m. Monday - Friday.  Please note that voicemails left after 4:00 p.m. may not be returned until the following business day.  We are closed weekends and major holidays.  You do have access to a nurse 24-7, just call the main number to the clinic [phone] and do not press any options, hold on the line and a nurse will answer the phone.    For prescription refill requests, have your pharmacy contact our office and allow 72 hours.    Due to Covid, you will need to wear a mask upon  entering the hospital. If you do not have a mask, a mask will be given to you at the Main Entrance upon arrival. For doctor visits, patients may have 1 support person age 18 or older with them. For treatment visits, patients can not have anyone with them due to social distancing guidelines and our immunocompromised population.      

## 2022-01-25 NOTE — Progress Notes (Signed)
Patient has been examined by Dr. Katragadda, and vital signs and labs have been reviewed. ANC, Creatinine, LFTs, hemoglobin, and platelets are within treatment parameters per M.D. - pt may proceed with treatment.    °

## 2022-01-25 NOTE — Progress Notes (Signed)
Patient presents today for Imfinzi/Gemzar/Cisplatin infusion per providers order.  Vital signs within parameters for treatment.  Labs pending.  Patient has no new complaints at this time. ? ?CMP faxed from lab and reviewed with charge nurse. Labs within parameters for treatment. ? ?Patient has voided 300 cc. ? ?Imfinzi/Gemzar/Cisplatin given today per MD orders.  Stable during infusion without adverse affects.  Vital signs stable.  No complaints at this time.  Discharge from clinic ambulatory in stable condition.  Alert and oriented X 3.  Follow up with [facility] as scheduled.  ? ? ?

## 2022-01-25 NOTE — Patient Instructions (Signed)
[facility]  Discharge Instructions: ?Thank you for choosing [facility] to provide your oncology and hematology care.  ?If you have a lab appointment with the [facility], please come in thru the Main Entrance and check in at the main information desk. ? ?Wear comfortable clothing and clothing appropriate for easy access to any Portacath or PICC line.  ? ?We strive to give you quality time with your provider. You may need to reschedule your appointment if you arrive late (15 or more minutes).  Arriving late affects you and other patients whose appointments are after yours.  Also, if you miss three or more appointments without notifying the office, you may be dismissed from the clinic at the provider?s discretion.    ?  ?For prescription refill requests, have your pharmacy contact our office and allow 72 hours for refills to be completed.   ? ?Today you received the following chemotherapy and/or immunotherapy agents Imfinzi/Gemzar/Cisplatin    ?  ?To help prevent nausea and vomiting after your treatment, we encourage you to take your nausea medication as directed. ? ?BELOW ARE SYMPTOMS THAT SHOULD BE REPORTED IMMEDIATELY: ?*FEVER GREATER THAN 100.4 F (38 ?C) OR HIGHER ?*CHILLS OR SWEATING ?*NAUSEA AND VOMITING THAT IS NOT CONTROLLED WITH YOUR NAUSEA MEDICATION ?*UNUSUAL SHORTNESS OF BREATH ?*UNUSUAL BRUISING OR BLEEDING ?*URINARY PROBLEMS (pain or burning when urinating, or frequent urination) ?*BOWEL PROBLEMS (unusual diarrhea, constipation, pain near the anus) ?TENDERNESS IN MOUTH AND THROAT WITH OR WITHOUT PRESENCE OF ULCERS (sore throat, sores in mouth, or a toothache) ?UNUSUAL RASH, SWELLING OR PAIN  ?UNUSUAL VAGINAL DISCHARGE OR ITCHING  ? ?Items with * indicate a potential emergency and should be followed up as soon as possible or go to the Emergency Department if any problems should occur. ? ?Please show the CHEMOTHERAPY ALERT CARD or IMMUNOTHERAPY ALERT CARD at check-in to the  Emergency Department and triage nurse. ? ?Should you have questions after your visit or need to cancel or reschedule your appointment, please contact [facility] [phone]  and follow the prompts.  Office hours are 8:00 a.m. to 4:30 p.m. Monday - Friday. Please note that voicemails left after 4:00 p.m. may not be returned until the following business day.  We are closed weekends and major holidays. You have access to a nurse at all times for urgent questions. Please call the main number to the clinic [phone] and follow the prompts. ? ?For any non-urgent questions, you may also contact your provider using MyChart. We now offer e-Visits for anyone 21 and older to request care online for non-urgent symptoms. For details visit [email or web address]. ?  ?Also download the MyChart app! Go to the app store, search "MyChart", open the app, select [facility], and log in with your MyChart username and password. ? ?Due to Covid, a mask is required upon entering the hospital/clinic. If you do not have a mask, one will be given to you upon arrival. For doctor visits, patients may have 1 support person aged 77 or older with them. For treatment visits, patients cannot have anyone with them due to current Covid guidelines and our immunocompromised population.  ?

## 2022-01-26 ENCOUNTER — Encounter (INDEPENDENT_AMBULATORY_CARE_PROVIDER_SITE_OTHER): Payer: Self-pay | Admitting: Gastroenterology

## 2022-01-26 ENCOUNTER — Ambulatory Visit (INDEPENDENT_AMBULATORY_CARE_PROVIDER_SITE_OTHER): Payer: Medicare Other | Admitting: Gastroenterology

## 2022-02-01 ENCOUNTER — Inpatient Hospital Stay (HOSPITAL_COMMUNITY): Payer: Medicare Other

## 2022-02-01 VITALS — BP 139/82 | HR 72 | Temp 97.3°F | Resp 18

## 2022-02-01 DIAGNOSIS — Z08 Encounter for follow-up examination after completed treatment for malignant neoplasm: Secondary | ICD-10-CM

## 2022-02-01 DIAGNOSIS — C221 Intrahepatic bile duct carcinoma: Secondary | ICD-10-CM

## 2022-02-01 DIAGNOSIS — Z95828 Presence of other vascular implants and grafts: Secondary | ICD-10-CM

## 2022-02-01 DIAGNOSIS — Z5112 Encounter for antineoplastic immunotherapy: Secondary | ICD-10-CM | POA: Diagnosis not present

## 2022-02-01 LAB — CBC WITH DIFFERENTIAL/PLATELET
Abs Immature Granulocytes: 0.01 10*3/uL (ref 0.00–0.07)
Basophils Absolute: 0 10*3/uL (ref 0.0–0.1)
Basophils Relative: 1 %
Eosinophils Absolute: 0.1 10*3/uL (ref 0.0–0.5)
Eosinophils Relative: 4 %
HCT: 28.5 % — ABNORMAL LOW (ref 39.0–52.0)
Hemoglobin: 9.6 g/dL — ABNORMAL LOW (ref 13.0–17.0)
Immature Granulocytes: 0 %
Lymphocytes Relative: 36 %
Lymphs Abs: 1 10*3/uL (ref 0.7–4.0)
MCH: 35.4 pg — ABNORMAL HIGH (ref 26.0–34.0)
MCHC: 33.7 g/dL (ref 30.0–36.0)
MCV: 105.2 fL — ABNORMAL HIGH (ref 80.0–100.0)
Monocytes Absolute: 0.4 10*3/uL (ref 0.1–1.0)
Monocytes Relative: 12 %
Neutro Abs: 1.3 10*3/uL — ABNORMAL LOW (ref 1.7–7.7)
Neutrophils Relative %: 47 %
Platelets: 167 10*3/uL (ref 150–400)
RBC: 2.71 MIL/uL — ABNORMAL LOW (ref 4.22–5.81)
RDW: 13.2 % (ref 11.5–15.5)
WBC: 2.9 10*3/uL — ABNORMAL LOW (ref 4.0–10.5)
nRBC: 0 % (ref 0.0–0.2)

## 2022-02-01 LAB — COMPREHENSIVE METABOLIC PANEL
ALT: 23 U/L (ref 0–44)
AST: 25 U/L (ref 15–41)
Albumin: 3.6 g/dL (ref 3.5–5.0)
Alkaline Phosphatase: 75 U/L (ref 38–126)
Anion gap: 5 (ref 5–15)
BUN: 21 mg/dL (ref 8–23)
CO2: 26 mmol/L (ref 22–32)
Calcium: 9 mg/dL (ref 8.9–10.3)
Chloride: 107 mmol/L (ref 98–111)
Creatinine, Ser: 1.19 mg/dL (ref 0.61–1.24)
GFR, Estimated: >60 mL/min (ref >60)
Glucose, Bld: 121 mg/dL — ABNORMAL HIGH (ref 70–99)
Potassium: 3.8 mmol/L (ref 3.5–5.1)
Sodium: 138 mmol/L (ref 135–145)
Total Bilirubin: 0.2 mg/dL — ABNORMAL LOW (ref 0.3–1.2)
Total Protein: 6.2 g/dL — ABNORMAL LOW (ref 6.5–8.1)

## 2022-02-01 LAB — MAGNESIUM: Magnesium: 1.7 mg/dL (ref 1.7–2.4)

## 2022-02-01 MED ORDER — PALONOSETRON HCL INJECTION 0.25 MG/5ML
0.2500 mg | Freq: Once | INTRAVENOUS | Status: AC
  Administered 2022-02-01: 0.25 mg via INTRAVENOUS
  Filled 2022-02-01: qty 5

## 2022-02-01 MED ORDER — SODIUM CHLORIDE 0.9 % IV SOLN: Freq: Once | INTRAVENOUS | Status: AC

## 2022-02-01 MED ORDER — MAGNESIUM SULFATE 2 GM/50ML IV SOLN
2.0000 g | Freq: Once | INTRAVENOUS | Status: AC
  Administered 2022-02-01: 2 g via INTRAVENOUS
  Filled 2022-02-01: qty 50

## 2022-02-01 MED ORDER — SODIUM CHLORIDE 0.9 % IV SOLN
10.0000 mg | Freq: Once | INTRAVENOUS | Status: AC
  Administered 2022-02-01: 10 mg via INTRAVENOUS
  Filled 2022-02-01: qty 10

## 2022-02-01 MED ORDER — SODIUM CHLORIDE 0.9 % IV SOLN
150.0000 mg | Freq: Once | INTRAVENOUS | Status: AC
  Administered 2022-02-01: 150 mg via INTRAVENOUS
  Filled 2022-02-01: qty 150

## 2022-02-01 MED ORDER — POTASSIUM CHLORIDE IN NACL 20-0.9 MEQ/L-% IV SOLN
Freq: Once | INTRAVENOUS | Status: AC
  Filled 2022-02-01: qty 1000

## 2022-02-01 MED ORDER — SODIUM CHLORIDE 0.9 % IV SOLN
1000.0000 mg/m2 | Freq: Once | INTRAVENOUS | Status: AC
  Administered 2022-02-01: 2470 mg via INTRAVENOUS
  Filled 2022-02-01: qty 26.3

## 2022-02-01 MED ORDER — SODIUM CHLORIDE 0.9 % IV SOLN
25.0000 mg/m2 | Freq: Once | INTRAVENOUS | Status: AC
  Administered 2022-02-01: 62 mg via INTRAVENOUS
  Filled 2022-02-01: qty 62

## 2022-02-01 MED ORDER — HEPARIN SOD (PORK) LOCK FLUSH 100 UNIT/ML IV SOLN
500.0000 [IU] | Freq: Once | INTRAVENOUS | Status: AC | PRN
  Administered 2022-02-01: 500 [IU]

## 2022-02-01 MED ORDER — SODIUM CHLORIDE 0.9% FLUSH
10.0000 mL | INTRAVENOUS | Status: DC | PRN
  Administered 2022-02-01: 10 mL

## 2022-02-01 NOTE — Progress Notes (Signed)
Patient presents today for chemotherapy infusion.  Patient is in satisfactory condition with no new complaints voiced.  Vital signs are stable.  Labs reviewed.  ANC is 1.3 today.  MD made aware.  All other labs are within treatment parameters.  Ok to proceed with treatment per Dr. Delton Coombes.   375 mL of urine output collected prior to Cisplatin infusion.   Patient tolerated treatment well with no complaints voiced.  Patient left ambulatory in stable condition.  Vital signs stable at discharge.  Follow up as scheduled.

## 2022-02-01 NOTE — Patient Instructions (Signed)
[facility]  Discharge Instructions: Thank you for choosing [facility] to provide your oncology and hematology care.  If you have a lab appointment with the [facility], please come in thru the Main Entrance and check in at the main information desk.  Wear comfortable clothing and clothing appropriate for easy access to any Portacath or PICC line.   We strive to give you quality time with your provider. You may need to reschedule your appointment if you arrive late (15 or more minutes).  Arriving late affects you and other patients whose appointments are after yours.  Also, if you miss three or more appointments without notifying the office, you may be dismissed from the clinic at the provider's discretion.      For prescription refill requests, have your pharmacy contact our office and allow 72 hours for refills to be completed.    Today you received the following chemotherapy and/or immunotherapy agents Gemzar and Cisplatin.  Cisplatin injection What is this medication? CISPLATIN (SIS pla tin) is a chemotherapy drug. It targets fast dividing cells, like cancer cells, and causes these cells to die. This medicine is used to treat many types of cancer like bladder, ovarian, and testicular cancers. This medicine may be used for other purposes; ask your health care provider or pharmacist if you have questions. COMMON BRAND NAME(S): Platinol, Platinol -AQ What should I tell my care team before I take this medication? They need to know if you have any of these conditions: eye disease, vision problems hearing problems kidney disease low blood counts, like white cells, platelets, or red blood cells tingling of the fingers or toes, or other nerve disorder an unusual or allergic reaction to cisplatin, carboplatin, oxaliplatin, other medicines, foods, dyes, or preservatives pregnant or trying to get pregnant breast-feeding How should I use this medication? This drug is  given as an infusion into a vein. It is administered in a hospital or clinic by a specially trained health care professional. Talk to your pediatrician regarding the use of this medicine in children. Special care may be needed. Overdosage: If you think you have taken too much of this medicine contact a poison control center or emergency room at once. NOTE: This medicine is only for you. Do not share this medicine with others. What if I miss a dose? It is important not to miss a dose. Call your doctor or health care professional if you are unable to keep an appointment. What may interact with this medication? This medicine may interact with the following medications: foscarnet certain antibiotics like amikacin, gentamicin, neomycin, polymyxin B, streptomycin, tobramycin, vancomycin This list may not describe all possible interactions. Give your health care provider a list of all the medicines, herbs, non-prescription drugs, or dietary supplements you use. Also tell them if you smoke, drink alcohol, or use illegal drugs. Some items may interact with your medicine. What should I watch for while using this medication? Your condition will be monitored carefully while you are receiving this medicine. You will need important blood work done while you are taking this medicine. This drug may make you feel generally unwell. This is not uncommon, as chemotherapy can affect healthy cells as well as cancer cells. Report any side effects. Continue your course of treatment even though you feel ill unless your doctor tells you to stop. This medicine may increase your risk of getting an infection. Call your healthcare professional for advice if you get a fever, chills, or sore throat,  or other symptoms of a cold or flu. Do not treat yourself. Try to avoid being around people who are sick. Avoid taking medicines that contain aspirin, acetaminophen, ibuprofen, naproxen, or ketoprofen unless instructed by your healthcare  professional. These medicines may hide a fever. This medicine may increase your risk to bruise or bleed. Call your doctor or health care professional if you notice any unusual bleeding. Be careful brushing and flossing your teeth or using a toothpick because you may get an infection or bleed more easily. If you have any dental work done, tell your dentist you are receiving this medicine. Do not become pregnant while taking this medicine or for 14 months after stopping it. Women should inform their healthcare professional if they wish to become pregnant or think they might be pregnant. Men should not father a child while taking this medicine and for 11 months after stopping it. There is potential for serious side effects to an unborn child. Talk to your healthcare professional for more information. Do not breast-feed an infant while taking this medicine. This medicine has caused ovarian failure in some women. This medicine may make it more difficult to get pregnant. Talk to your healthcare professional if you are concerned about your fertility. This medicine has caused decreased sperm counts in some men. This may make it more difficult to father a child. Talk to your healthcare professional if you are concerned about your fertility. Drink fluids as directed while you are taking this medicine. This will help protect your kidneys. Call your doctor or health care professional if you get diarrhea. Do not treat yourself. What side effects may I notice from receiving this medication? Side effects that you should report to your doctor or health care professional as soon as possible: allergic reactions like skin rash, itching or hives, swelling of the face, lips, or tongue blurred vision changes in vision decreased hearing or ringing of the ears nausea, vomiting pain, redness, or irritation at site where injected pain, tingling, numbness in the hands or feet signs and symptoms of bleeding such as bloody or  black, tarry stools; red or dark Phoenyx Paulsen urine; spitting up blood or Zhoey Blackstock material that looks like coffee grounds; red spots on the skin; unusual bruising or bleeding from the eyes, gums, or nose signs and symptoms of infection like fever; chills; cough; sore throat; pain or trouble passing urine signs and symptoms of kidney injury like trouble passing urine or change in the amount of urine signs and symptoms of low red blood cells or anemia such as unusually weak or tired; feeling faint or lightheaded; falls; breathing problems Side effects that usually do not require medical attention (report to your doctor or health care professional if they continue or are bothersome): loss of appetite mouth sores muscle cramps This list may not describe all possible side effects. Call your doctor for medical advice about side effects. You may report side effects to FDA at 1-800-FDA-1088. Where should I keep my medication? This drug is given in a hospital or clinic and will not be stored at home. NOTE: This sheet is a summary. It may not cover all possible information. If you have questions about this medicine, talk to your doctor, pharmacist, or health care provider.  2023 Elsevier/Gold Standard (2021-07-29 00:00:00)   Gemcitabine injection What is this medication? GEMCITABINE (jem SYE ta been) is a chemotherapy drug. This medicine is used to treat many types of cancer like breast cancer, lung cancer, pancreatic cancer, and ovarian cancer. This  medicine may be used for other purposes; ask your health care provider or pharmacist if you have questions. COMMON BRAND NAME(S): Gemzar, Infugem What should I tell my care team before I take this medication? They need to know if you have any of these conditions: blood disorders infection kidney disease liver disease lung or breathing disease, like asthma recent or ongoing radiation therapy an unusual or allergic reaction to gemcitabine, other chemotherapy,  other medicines, foods, dyes, or preservatives pregnant or trying to get pregnant breast-feeding How should I use this medication? This drug is given as an infusion into a vein. It is administered in a hospital or clinic by a specially trained health care professional. Talk to your pediatrician regarding the use of this medicine in children. Special care may be needed. Overdosage: If you think you have taken too much of this medicine contact a poison control center or emergency room at once. NOTE: This medicine is only for you. Do not share this medicine with others. What if I miss a dose? It is important not to miss your dose. Call your doctor or health care professional if you are unable to keep an appointment. What may interact with this medication? medicines to increase blood counts like filgrastim, pegfilgrastim, sargramostim some other chemotherapy drugs like cisplatin vaccines Talk to your doctor or health care professional before taking any of these medicines: acetaminophen aspirin ibuprofen ketoprofen naproxen This list may not describe all possible interactions. Give your health care provider a list of all the medicines, herbs, non-prescription drugs, or dietary supplements you use. Also tell them if you smoke, drink alcohol, or use illegal drugs. Some items may interact with your medicine. What should I watch for while using this medication? Visit your doctor for checks on your progress. This drug may make you feel generally unwell. This is not uncommon, as chemotherapy can affect healthy cells as well as cancer cells. Report any side effects. Continue your course of treatment even though you feel ill unless your doctor tells you to stop. In some cases, you may be given additional medicines to help with side effects. Follow all directions for their use. Call your doctor or health care professional for advice if you get a fever, chills or sore throat, or other symptoms of a cold or  flu. Do not treat yourself. This drug decreases your body's ability to fight infections. Try to avoid being around people who are sick. This medicine may increase your risk to bruise or bleed. Call your doctor or health care professional if you notice any unusual bleeding. Be careful brushing and flossing your teeth or using a toothpick because you may get an infection or bleed more easily. If you have any dental work done, tell your dentist you are receiving this medicine. Avoid taking products that contain aspirin, acetaminophen, ibuprofen, naproxen, or ketoprofen unless instructed by your doctor. These medicines may hide a fever. Do not become pregnant while taking this medicine or for 6 months after stopping it. Women should inform their doctor if they wish to become pregnant or think they might be pregnant. Men should not father a child while taking this medicine and for 3 months after stopping it. There is a potential for serious side effects to an unborn child. Talk to your health care professional or pharmacist for more information. Do not breast-feed an infant while taking this medicine or for at least 1 week after stopping it. Men should inform their doctors if they wish to father a child.  This medicine may lower sperm counts. Talk with your doctor or health care professional if you are concerned about your fertility. What side effects may I notice from receiving this medication? Side effects that you should report to your doctor or health care professional as soon as possible: allergic reactions like skin rash, itching or hives, swelling of the face, lips, or tongue breathing problems pain, redness, or irritation at site where injected signs and symptoms of a dangerous change in heartbeat or heart rhythm like chest pain; dizziness; fast or irregular heartbeat; palpitations; feeling faint or lightheaded, falls; breathing problems signs of decreased platelets or bleeding - bruising, pinpoint red  spots on the skin, black, tarry stools, blood in the urine signs of decreased red blood cells - unusually weak or tired, feeling faint or lightheaded, falls signs of infection - fever or chills, cough, sore throat, pain or difficulty passing urine signs and symptoms of kidney injury like trouble passing urine or change in the amount of urine signs and symptoms of liver injury like dark yellow or Tyrome Donatelli urine; general ill feeling or flu-like symptoms; light-colored stools; loss of appetite; nausea; right upper belly pain; unusually weak or tired; yellowing of the eyes or skin swelling of ankles, feet, hands Side effects that usually do not require medical attention (report to your doctor or health care professional if they continue or are bothersome): constipation diarrhea hair loss loss of appetite nausea rash vomiting This list may not describe all possible side effects. Call your doctor for medical advice about side effects. You may report side effects to FDA at 1-800-FDA-1088. Where should I keep my medication? This drug is given in a hospital or clinic and will not be stored at home. NOTE: This sheet is a summary. It may not cover all possible information. If you have questions about this medicine, talk to your doctor, pharmacist, or health care provider.  2023 Elsevier/Gold Standard (2017-11-21 00:00:00)       To help prevent nausea and vomiting after your treatment, we encourage you to take your nausea medication as directed.  BELOW ARE SYMPTOMS THAT SHOULD BE REPORTED IMMEDIATELY: *FEVER GREATER THAN 100.4 F (38 C) OR HIGHER *CHILLS OR SWEATING *NAUSEA AND VOMITING THAT IS NOT CONTROLLED WITH YOUR NAUSEA MEDICATION *UNUSUAL SHORTNESS OF BREATH *UNUSUAL BRUISING OR BLEEDING *URINARY PROBLEMS (pain or burning when urinating, or frequent urination) *BOWEL PROBLEMS (unusual diarrhea, constipation, pain near the anus) TENDERNESS IN MOUTH AND THROAT WITH OR WITHOUT PRESENCE OF ULCERS  (sore throat, sores in mouth, or a toothache) UNUSUAL RASH, SWELLING OR PAIN  UNUSUAL VAGINAL DISCHARGE OR ITCHING   Items with * indicate a potential emergency and should be followed up as soon as possible or go to the Emergency Department if any problems should occur.  Please show the CHEMOTHERAPY ALERT CARD or IMMUNOTHERAPY ALERT CARD at check-in to the Emergency Department and triage nurse.  Should you have questions after your visit or need to cancel or reschedule your appointment, please contact [facility] [phone]  and follow the prompts.  Office hours are 8:00 a.m. to 4:30 p.m. Monday - Friday. Please note that voicemails left after 4:00 p.m. may not be returned until the following business day.  We are closed weekends and major holidays. You have access to a nurse at all times for urgent questions. Please call the main number to the clinic [phone] and follow the prompts.  For any non-urgent questions, you may also contact your provider using MyChart. We now  offer e-Visits for anyone 16 and older to request care online for non-urgent symptoms. For details visit [email or web address].   Also download the MyChart app! Go to the app store, search "MyChart", open the app, select [facility], and log in with your MyChart username and password.  Due to Covid, a mask is required upon entering the hospital/clinic. If you do not have a mask, one will be given to you upon arrival. For doctor visits, patients may have 1 support person aged 38 or older with them. For treatment visits, patients cannot have anyone with them due to current Covid guidelines and our immunocompromised population.

## 2022-02-02 LAB — CEA: CEA: 2.3 ng/mL (ref 0.0–4.7)

## 2022-02-03 ENCOUNTER — Other Ambulatory Visit (HOSPITAL_COMMUNITY): Payer: Self-pay | Admitting: Physician Assistant

## 2022-02-03 ENCOUNTER — Encounter (HOSPITAL_COMMUNITY): Payer: Self-pay

## 2022-02-03 ENCOUNTER — Inpatient Hospital Stay (HOSPITAL_COMMUNITY): Payer: Medicare Other

## 2022-02-03 VITALS — BP 147/86 | HR 70 | Temp 97.9°F | Resp 18

## 2022-02-03 DIAGNOSIS — Z95828 Presence of other vascular implants and grafts: Secondary | ICD-10-CM

## 2022-02-03 DIAGNOSIS — K30 Functional dyspepsia: Secondary | ICD-10-CM

## 2022-02-03 DIAGNOSIS — Z5112 Encounter for antineoplastic immunotherapy: Secondary | ICD-10-CM | POA: Diagnosis not present

## 2022-02-03 DIAGNOSIS — C221 Intrahepatic bile duct carcinoma: Secondary | ICD-10-CM

## 2022-02-03 MED ORDER — PEGFILGRASTIM-CBQV 6 MG/0.6ML ~~LOC~~ SOSY
6.0000 mg | PREFILLED_SYRINGE | Freq: Once | SUBCUTANEOUS | Status: AC
  Administered 2022-02-03: 6 mg via SUBCUTANEOUS
  Filled 2022-02-03: qty 0.6

## 2022-02-03 NOTE — Progress Notes (Signed)
Patient tolerated injection with no complaints voiced.  Site clean and dry with no bruising or swelling noted at site.  See MAR for details.  Band aid applied.  Patient stable during and after injection.  Vss with discharge and left in satisfactory condition with no s/s of distress noted.  

## 2022-02-03 NOTE — Patient Instructions (Signed)
[facility]  Discharge Instructions: Thank you for choosing [facility] to provide your oncology and hematology care.  If you have a lab appointment with the Cancer Center, please come in thru the Main Entrance and check in at the main information desk.  Wear comfortable clothing and clothing appropriate for easy access to any Portacath or PICC line.   We strive to give you quality time with your provider. You may need to reschedule your appointment if you arrive late (15 or more minutes).  Arriving late affects you and other patients whose appointments are after yours.  Also, if you miss three or more appointments without notifying the office, you may be dismissed from the clinic at the provider's discretion.      For prescription refill requests, have your pharmacy contact our office and allow 72 hours for refills to be completed.    Today you received the following chemotherapy and/or immunotherapy agents udenyca.       To help prevent nausea and vomiting after your treatment, we encourage you to take your nausea medication as directed.  BELOW ARE SYMPTOMS THAT SHOULD BE REPORTED IMMEDIATELY: *FEVER GREATER THAN 100.4 F (38 C) OR HIGHER *CHILLS OR SWEATING *NAUSEA AND VOMITING THAT IS NOT CONTROLLED WITH YOUR NAUSEA MEDICATION *UNUSUAL SHORTNESS OF BREATH *UNUSUAL BRUISING OR BLEEDING *URINARY PROBLEMS (pain or burning when urinating, or frequent urination) *BOWEL PROBLEMS (unusual diarrhea, constipation, pain near the anus) TENDERNESS IN MOUTH AND THROAT WITH OR WITHOUT PRESENCE OF ULCERS (sore throat, sores in mouth, or a toothache) UNUSUAL RASH, SWELLING OR PAIN  UNUSUAL VAGINAL DISCHARGE OR ITCHING   Items with * indicate a potential emergency and should be followed up as soon as possible or go to the Emergency Department if any problems should occur.  Please show the CHEMOTHERAPY ALERT CARD or IMMUNOTHERAPY ALERT CARD at check-in to the Emergency  Department and triage nurse.  Should you have questions after your visit or need to cancel or reschedule your appointment, please contact [facility] [phone]  and follow the prompts.  Office hours are 8:00 a.m. to 4:30 p.m. Monday - Friday. Please note that voicemails left after 4:00 p.m. may not be returned until the following business day.  We are closed weekends and major holidays. You have access to a nurse at all times for urgent questions. Please call the main number to the clinic [phone] and follow the prompts.  For any non-urgent questions, you may also contact your provider using MyChart. We now offer e-Visits for anyone 18 and older to request care online for non-urgent symptoms. For details visit [email or web address].   Also download the MyChart app! Go to the app store, search "MyChart", open the app, select [facility], and log in with your MyChart username and password.  Due to Covid, a mask is required upon entering the hospital/clinic. If you do not have a mask, one will be given to you upon arrival. For doctor visits, patients may have 1 support person aged 18 or older with them. For treatment visits, patients cannot have anyone with them due to current Covid guidelines and our immunocompromised population.  

## 2022-02-15 ENCOUNTER — Inpatient Hospital Stay (HOSPITAL_COMMUNITY): Payer: Medicare Other | Attending: Hematology | Admitting: Hematology

## 2022-02-15 ENCOUNTER — Inpatient Hospital Stay (HOSPITAL_COMMUNITY): Payer: Medicare Other

## 2022-02-15 VITALS — BP 156/87 | HR 84 | Temp 97.4°F | Resp 20 | Ht 72.0 in | Wt 263.8 lb

## 2022-02-15 VITALS — BP 165/96

## 2022-02-15 DIAGNOSIS — Z95828 Presence of other vascular implants and grafts: Secondary | ICD-10-CM

## 2022-02-15 DIAGNOSIS — Z808 Family history of malignant neoplasm of other organs or systems: Secondary | ICD-10-CM | POA: Diagnosis not present

## 2022-02-15 DIAGNOSIS — R0602 Shortness of breath: Secondary | ICD-10-CM | POA: Insufficient documentation

## 2022-02-15 DIAGNOSIS — R970 Elevated carcinoembryonic antigen [CEA]: Secondary | ICD-10-CM | POA: Insufficient documentation

## 2022-02-15 DIAGNOSIS — H9313 Tinnitus, bilateral: Secondary | ICD-10-CM | POA: Diagnosis not present

## 2022-02-15 DIAGNOSIS — G4700 Insomnia, unspecified: Secondary | ICD-10-CM | POA: Diagnosis not present

## 2022-02-15 DIAGNOSIS — Z8551 Personal history of malignant neoplasm of bladder: Secondary | ICD-10-CM | POA: Diagnosis not present

## 2022-02-15 DIAGNOSIS — Z79899 Other long term (current) drug therapy: Secondary | ICD-10-CM

## 2022-02-15 DIAGNOSIS — G629 Polyneuropathy, unspecified: Secondary | ICD-10-CM | POA: Insufficient documentation

## 2022-02-15 DIAGNOSIS — Z8042 Family history of malignant neoplasm of prostate: Secondary | ICD-10-CM | POA: Insufficient documentation

## 2022-02-15 DIAGNOSIS — Z5189 Encounter for other specified aftercare: Secondary | ICD-10-CM | POA: Diagnosis not present

## 2022-02-15 DIAGNOSIS — C787 Secondary malignant neoplasm of liver and intrahepatic bile duct: Secondary | ICD-10-CM

## 2022-02-15 DIAGNOSIS — R059 Cough, unspecified: Secondary | ICD-10-CM | POA: Insufficient documentation

## 2022-02-15 DIAGNOSIS — R5383 Other fatigue: Secondary | ICD-10-CM | POA: Insufficient documentation

## 2022-02-15 DIAGNOSIS — I10 Essential (primary) hypertension: Secondary | ICD-10-CM

## 2022-02-15 DIAGNOSIS — C221 Intrahepatic bile duct carcinoma: Secondary | ICD-10-CM

## 2022-02-15 DIAGNOSIS — Z5111 Encounter for antineoplastic chemotherapy: Secondary | ICD-10-CM | POA: Diagnosis not present

## 2022-02-15 DIAGNOSIS — C786 Secondary malignant neoplasm of retroperitoneum and peritoneum: Secondary | ICD-10-CM | POA: Insufficient documentation

## 2022-02-15 DIAGNOSIS — Z87891 Personal history of nicotine dependence: Secondary | ICD-10-CM | POA: Diagnosis not present

## 2022-02-15 DIAGNOSIS — Z809 Family history of malignant neoplasm, unspecified: Secondary | ICD-10-CM | POA: Diagnosis not present

## 2022-02-15 LAB — COMPREHENSIVE METABOLIC PANEL
ALT: 17 U/L (ref 0–44)
AST: 18 U/L (ref 15–41)
Albumin: 3.6 g/dL (ref 3.5–5.0)
Alkaline Phosphatase: 125 U/L (ref 38–126)
Anion gap: 4 — ABNORMAL LOW (ref 5–15)
BUN: 25 mg/dL — ABNORMAL HIGH (ref 8–23)
CO2: 27 mmol/L (ref 22–32)
Calcium: 9 mg/dL (ref 8.9–10.3)
Chloride: 107 mmol/L (ref 98–111)
Creatinine, Ser: 0.92 mg/dL (ref 0.61–1.24)
GFR, Estimated: >60 mL/min (ref >60)
Glucose, Bld: 142 mg/dL — ABNORMAL HIGH (ref 70–99)
Potassium: 3.9 mmol/L (ref 3.5–5.1)
Sodium: 138 mmol/L (ref 135–145)
Total Bilirubin: 0.3 mg/dL (ref 0.3–1.2)
Total Protein: 5.9 g/dL — ABNORMAL LOW (ref 6.5–8.1)

## 2022-02-15 LAB — MAGNESIUM: Magnesium: 1.7 mg/dL (ref 1.7–2.4)

## 2022-02-15 LAB — CBC WITH DIFFERENTIAL/PLATELET
Abs Immature Granulocytes: 0.04 10*3/uL (ref 0.00–0.07)
Basophils Absolute: 0 10*3/uL (ref 0.0–0.1)
Basophils Relative: 1 %
Eosinophils Absolute: 0.2 10*3/uL (ref 0.0–0.5)
Eosinophils Relative: 3 %
HCT: 31.2 % — ABNORMAL LOW (ref 39.0–52.0)
Hemoglobin: 10.6 g/dL — ABNORMAL LOW (ref 13.0–17.0)
Immature Granulocytes: 1 %
Lymphocytes Relative: 22 %
Lymphs Abs: 1.1 10*3/uL (ref 0.7–4.0)
MCH: 35.8 pg — ABNORMAL HIGH (ref 26.0–34.0)
MCHC: 34 g/dL (ref 30.0–36.0)
MCV: 105.4 fL — ABNORMAL HIGH (ref 80.0–100.0)
Monocytes Absolute: 0.7 10*3/uL (ref 0.1–1.0)
Monocytes Relative: 14 %
Neutro Abs: 2.9 10*3/uL (ref 1.7–7.7)
Neutrophils Relative %: 59 %
Platelets: 182 10*3/uL (ref 150–400)
RBC: 2.96 MIL/uL — ABNORMAL LOW (ref 4.22–5.81)
RDW: 13.8 % (ref 11.5–15.5)
WBC: 4.9 10*3/uL (ref 4.0–10.5)
nRBC: 0 % (ref 0.0–0.2)

## 2022-02-15 LAB — TSH: TSH: 4.488 u[IU]/mL (ref 0.350–4.500)

## 2022-02-15 MED ORDER — SODIUM CHLORIDE 0.9 % IV SOLN
25.0000 mg/m2 | Freq: Once | INTRAVENOUS | Status: AC
  Administered 2022-02-15: 62 mg via INTRAVENOUS
  Filled 2022-02-15: qty 62

## 2022-02-15 MED ORDER — SODIUM CHLORIDE 0.9 % IV SOLN: Freq: Once | INTRAVENOUS | Status: AC

## 2022-02-15 MED ORDER — MAGNESIUM SULFATE 2 GM/50ML IV SOLN
2.0000 g | Freq: Once | INTRAVENOUS | Status: AC
  Administered 2022-02-15: 2 g via INTRAVENOUS
  Filled 2022-02-15: qty 50

## 2022-02-15 MED ORDER — POTASSIUM CHLORIDE IN NACL 20-0.9 MEQ/L-% IV SOLN
Freq: Once | INTRAVENOUS | Status: AC
  Filled 2022-02-15: qty 1000

## 2022-02-15 MED ORDER — HEPARIN SOD (PORK) LOCK FLUSH 100 UNIT/ML IV SOLN
500.0000 [IU] | Freq: Once | INTRAVENOUS | Status: AC | PRN
  Administered 2022-02-15: 500 [IU]

## 2022-02-15 MED ORDER — AMLODIPINE BESYLATE 5 MG PO TABS
5.0000 mg | ORAL_TABLET | Freq: Once | ORAL | Status: AC
  Administered 2022-02-15: 5 mg via ORAL
  Filled 2022-02-15: qty 1

## 2022-02-15 MED ORDER — OLMESARTAN MEDOXOMIL 40 MG PO TABS: 40.0000 mg | ORAL_TABLET | Freq: Every day | ORAL | 6 refills | Status: DC

## 2022-02-15 MED ORDER — SODIUM CHLORIDE 0.9 % IV SOLN
10.0000 mg | Freq: Once | INTRAVENOUS | Status: AC
  Administered 2022-02-15: 10 mg via INTRAVENOUS
  Filled 2022-02-15: qty 10

## 2022-02-15 MED ORDER — SODIUM CHLORIDE 0.9 % IV SOLN
1000.0000 mg/m2 | Freq: Once | INTRAVENOUS | Status: AC
  Administered 2022-02-15: 2470 mg via INTRAVENOUS
  Filled 2022-02-15: qty 52.6

## 2022-02-15 MED ORDER — SODIUM CHLORIDE 0.9 % IV SOLN
1500.0000 mg | Freq: Once | INTRAVENOUS | Status: AC
  Administered 2022-02-15: 1500 mg via INTRAVENOUS
  Filled 2022-02-15: qty 30

## 2022-02-15 MED ORDER — PALONOSETRON HCL INJECTION 0.25 MG/5ML
0.2500 mg | Freq: Once | INTRAVENOUS | Status: AC
  Administered 2022-02-15: 0.25 mg via INTRAVENOUS
  Filled 2022-02-15: qty 5

## 2022-02-15 MED ORDER — SODIUM CHLORIDE 0.9% FLUSH
10.0000 mL | INTRAVENOUS | Status: DC | PRN
  Administered 2022-02-15 (×2): 10 mL

## 2022-02-15 MED ORDER — SODIUM CHLORIDE 0.9 % IV SOLN
150.0000 mg | Freq: Once | INTRAVENOUS | Status: AC
  Administered 2022-02-15: 150 mg via INTRAVENOUS
  Filled 2022-02-15: qty 150

## 2022-02-15 NOTE — Progress Notes (Signed)
Patient has been examined by Dr. Delton Coombes, and vital signs and labs have been reviewed. ANC, Creatinine, LFTs, hemoglobin, and platelets are within treatment parameters per M.D. - pt may proceed with treatment.  We will give Norvasc 5 mg today for hypertension. Rx Benicar dose has been doubled to 40 mg daily.

## 2022-02-15 NOTE — Patient Instructions (Signed)
[facility]  Discharge Instructions: Thank you for choosing [facility] to provide your oncology and hematology care.  If you have a lab appointment with the [facility], please come in thru the Main Entrance and check in at the main information desk.  Wear comfortable clothing and clothing appropriate for easy access to any Portacath or PICC line.   We strive to give you quality time with your provider. You may need to reschedule your appointment if you arrive late (15 or more minutes).  Arriving late affects you and other patients whose appointments are after yours.  Also, if you miss three or more appointments without notifying the office, you may be dismissed from the clinic at the provider's discretion.      For prescription refill requests, have your pharmacy contact our office and allow 72 hours for refills to be completed.    Today you received the following chemotherapy and/or immunotherapy agents Imfinzim, Gemzar, and Cisplatin. Return as scheduled.   To help prevent nausea and vomiting after your treatment, we encourage you to take your nausea medication as directed.  BELOW ARE SYMPTOMS THAT SHOULD BE REPORTED IMMEDIATELY: *FEVER GREATER THAN 100.4 F (38 C) OR HIGHER *CHILLS OR SWEATING *NAUSEA AND VOMITING THAT IS NOT CONTROLLED WITH YOUR NAUSEA MEDICATION *UNUSUAL SHORTNESS OF BREATH *UNUSUAL BRUISING OR BLEEDING *URINARY PROBLEMS (pain or burning when urinating, or frequent urination) *BOWEL PROBLEMS (unusual diarrhea, constipation, pain near the anus) TENDERNESS IN MOUTH AND THROAT WITH OR WITHOUT PRESENCE OF ULCERS (sore throat, sores in mouth, or a toothache) UNUSUAL RASH, SWELLING OR PAIN  UNUSUAL VAGINAL DISCHARGE OR ITCHING   Items with * indicate a potential emergency and should be followed up as soon as possible or go to the Emergency Department if any problems should occur.  Please show the CHEMOTHERAPY ALERT CARD or IMMUNOTHERAPY ALERT  CARD at check-in to the Emergency Department and triage nurse.  Should you have questions after your visit or need to cancel or reschedule your appointment, please contact [facility] [phone]  and follow the prompts.  Office hours are 8:00 a.m. to 4:30 p.m. Monday - Friday. Please note that voicemails left after 4:00 p.m. may not be returned until the following business day.  We are closed weekends and major holidays. You have access to a nurse at all times for urgent questions. Please call the main number to the clinic [phone] and follow the prompts.  For any non-urgent questions, you may also contact your provider using MyChart. We now offer e-Visits for anyone 49 and older to request care online for non-urgent symptoms. For details visit [email or web address].   Also download the MyChart app! Go to the app store, search "MyChart", open the app, select [facility], and log in with your MyChart username and password.  Due to Covid, a mask is required upon entering the hospital/clinic. If you do not have a mask, one will be given to you upon arrival. For doctor visits, patients may have 1 support person aged 57 or older with them. For treatment visits, patients cannot have anyone with them due to current Covid guidelines and our immunocompromised population.

## 2022-02-15 NOTE — Patient Instructions (Signed)
[place] [facility] Discharge Instructions   You were seen and examined today by Dr. Delton Coombes.  He reviewed the results of you lab work which is normal/stable.  We will proceed with your treatment today.  Return as scheduled for treatment and office visit.    Thank you for choosing [place] at [facility] to provide your oncology and hematology care.  To afford each patient quality time with our provider, please arrive at least 15 minutes before your scheduled appointment time.   If you have a lab appointment with the [facility] please come in thru the Main Entrance and check in at the main information desk.  You need to re-schedule your appointment should you arrive 10 or more minutes late.  We strive to give you quality time with our providers, and arriving late affects you and other patients whose appointments are after yours.  Also, if you no show three or more times for appointments you may be dismissed from the clinic at the providers discretion.     Again, thank you for choosing [facility].  Our hope is that these requests will decrease the amount of time that you wait before being seen by our physicians.       _____________________________________________________________  Should you have questions after your visit to [facility], please contact our office at [phone] and follow the prompts.  Our office hours are 8:00 a.m. and 4:30 p.m. Monday - Friday.  Please note that voicemails left after 4:00 p.m. may not be returned until the following business day.  We are closed weekends and major holidays.  You do have access to a nurse 24-7, just call the main number to the clinic [phone] and do not press any options, hold on the line and a nurse will answer the phone.    For prescription refill requests, have your pharmacy contact our office and allow 72 hours.    Due to Covid, you will need to  wear a mask upon entering the hospital. If you do not have a mask, a mask will be given to you at the Main Entrance upon arrival. For doctor visits, patients may have 1 support person age 22 or older with them. For treatment visits, patients can not have anyone with them due to social distancing guidelines and our immunocompromised population.

## 2022-02-15 NOTE — Progress Notes (Signed)
Patient presents for treatment. Patient's blood pressure 175/91 left arm and 171/83 right arm, patient reports he feels like getting more winded when working out in the yard, Dr. Delton Coombes made aware. Patient okay for treatment today per Dr. Delton Coombes with additional orders for '5mg'$  of Norvasc.  Patient tolerated chemotherapy with no complaints voiced. Side effects with management reviewed understanding verbalized. Port site clean and dry with no bruising or swelling noted at site. Good blood return noted before and after administration of chemotherapy. Band aid applied. Patient left in satisfactory condition with VSS and no s/s of distress noted.

## 2022-02-15 NOTE — Progress Notes (Signed)
Marvin Carr [address] [place] [address]   CLINIC:  Medical Oncology/Hematology  PCP:  Celene Squibb, MD [address] 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  PD-L1 (RR116) negative.  MSI-stable.  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, a 66 y.o. male, returns for routine follow-up and consideration for next cycle of chemotherapy. Judas was last seen on 01/25/2022.  Due for cycle #7 of Cisplatin + Gemcitabine + Imfinzi today.   Overall, he tells me he has been feeling pretty well. His tinnitus and numbness in his toes is stable. He denies nausea and vomiting. He reports fatigue occurring 1 day following treatment and last for 2-3 days. He reports SOB with exertion. His appetite is good. His productive cough has improved, and he is taking Mucinex.   Overall, he feels ready for next cycle of chemo today.    REVIEW OF SYSTEMS:  Review of Systems  Constitutional:  Positive for fatigue. Negative for appetite change.  HENT:   Positive for tinnitus.   Respiratory:  Positive for cough and shortness of breath (with exertion).   Gastrointestinal:  Negative for nausea and vomiting.  Neurological:  Positive for headaches  and numbness.  All other systems reviewed and are negative.  PAST MEDICAL/SURGICAL HISTORY:  Past Medical History:  Diagnosis Date   Anxiety    Bladder cancer ([place]) 09/11/2009   CAD (coronary artery disease), native coronary artery    a. Mildly elevated troponin 03/2013, cath with nonobstructive disease including 50% AV groove distal stenosis before large OM   Essential hypertension    Headache(784.0)    History of migraines   Hyperglycemia    Mixed hyperlipidemia    Neuropathy    Obesity    Port-A-Cath in place 09/30/2021   Pre-diabetes    Seasonal allergies    Sleep apnea    On CPAP   Past Surgical History:  Procedure Laterality Date   BIOPSY  09/23/2021   Procedure: BIOPSY;  Surgeon: Harvel Quale, MD;  [facility];  Service: Gastroenterology;;   COLONOSCOPY  06/28/2011   Procedure: COLONOSCOPY;  Surgeon: Rogene Houston, MD;  [facility]: [facility];  Service: Endoscopy;  Laterality: N/A;   COLONOSCOPY WITH PROPOFOL N/A 09/23/2021   Procedure: COLONOSCOPY WITH PROPOFOL;  Surgeon: Harvel Quale, MD;  Location: [facility];  Service: Gastroenterology;  Laterality: N/A;  940   ESOPHAGOGASTRODUODENOSCOPY (EGD) WITH PROPOFOL N/A 09/23/2021   Procedure: ESOPHAGOGASTRODUODENOSCOPY (EGD) WITH PROPOFOL;  Surgeon: Harvel Quale, MD;  Location: [facility];  Service: Gastroenterology;  Laterality: N/A;   IR IMAGING GUIDED PORT INSERTION  09/28/2021   IR PARACENTESIS  09/28/2021   JOINT REPLACEMENT Right    hip   LEFT HEART  CATHETERIZATION WITH CORONARY ANGIOGRAM N/A 03/31/2013   Procedure: LEFT HEART CATHETERIZATION WITH CORONARY ANGIOGRAM;  Surgeon: Minus Breeding, MD;  Location: [facility] CATH LAB;  Service: Cardiovascular;  Laterality: N/A;   POLYPECTOMY  09/23/2021   Procedure: POLYPECTOMY;  Surgeon: Harvel Quale, MD;  Location: [facility];  Service: Gastroenterology;;   TURBT  09/11/2009    SOCIAL HISTORY:  Social History    Socioeconomic History   Marital status: Divorced    Spouse name: Not on file   Number of children: 0   Years of education: college   Highest education level: Not on file  Occupational History    Employer: SELF-EMPLOYED  Tobacco Use   Smoking status: Former    Packs/day: 0.50    Years: 10.00    Pack years: 5.00    Types: Cigarettes    Quit date: 07/10/2011    Years since quitting: 10.6   Smokeless tobacco: Never   Tobacco comments:    Quit several yrs prior to 03/2013.  Vaping Use   Vaping Use: Never used  Substance and Sexual Activity   Alcohol use: Yes    Alcohol/week: 0.0 standard drinks    Comment: Occasional   Drug use: No   Sexual activity: Not on file  Other Topics Concern   Not on file  Social History Narrative   Not on file   Social Determinants of Health   Financial Resource Strain: Not on file  Food Insecurity: Not on file  Transportation Needs: Not on file  Physical Activity: Not on file  Stress: Not on file  Social Connections: Not on file  Intimate Partner Violence: Not on file    FAMILY HISTORY:  Family History  Problem Relation Age of Onset   COPD Father     CURRENT MEDICATIONS:  Current Outpatient Medications  Medication Sig Dispense Refill   aspirin EC 81 MG tablet Take 81 mg by mouth daily.     buPROPion (WELLBUTRIN XL) 300 MG 24 hr tablet Take 300 mg by mouth daily.     CISPLATIN IV Inject into the vein once a week. Day 1, Day 8 q 21 days     cyclobenzaprine (FLEXERIL) 10 MG tablet Take 10 mg by mouth at bedtime as needed for muscle spasms.     Durvalumab (IMFINZI IV) Inject into the vein every 21 ( twenty-one) days.     Eszopiclone 3 MG TABS Take 3 mg by mouth at bedtime. Take immediately before bedtime     famotidine (PEPCID) 20 MG tablet TAKE (1) TABLET BY MOUTH AT BEDTIME. 30 tablet 0   fenofibrate 160 MG tablet Take 160 mg by mouth daily.     FLUoxetine (PROZAC) 10 MG capsule Take 10 mg by mouth daily.     FLUoxetine (PROZAC) 10  MG tablet Take 10 mg by mouth daily.     gabapentin (NEURONTIN) 300 MG capsule Take 300 mg by mouth 3 (three) times daily.     Gemcitabine HCl (GEMZAR IV) Inject into the vein once a week. Day 1, Day 8 q 21 days     HYDROcodone-acetaminophen (NORCO/VICODIN) 5-325 MG tablet Take 1 tablet by mouth every 4 (four) hours as needed for moderate pain. 30 tablet 0   Lactulose 20 GM/30ML SOLN Take 30 mLs (20 g total) by mouth daily. 450 mL 6   lidocaine-prilocaine (EMLA) cream Apply a small amount to port a cath site and cover with plastic wrap 1 hour prior to infusion appointments 30 g 3  magnesium oxide (MAG-OX) 400 (240 Mg) MG tablet Take 1 tablet (400 mg total) by mouth 2 (two) times daily. 90 tablet 6   metFORMIN (GLUCOPHAGE-XR) 500 MG 24 hr tablet Take 500 mg by mouth 2 (two) times daily.     nitroGLYCERIN (NITROSTAT) 0.4 MG SL tablet Place 1 tablet (0.4 mg total) under the tongue every 5 (five) minutes as needed for chest pain. 25 tablet 3   olmesartan (BENICAR) 20 MG tablet Take 20 mg by mouth daily.     omeprazole (PRILOSEC) 40 MG capsule Take 40 mg by mouth daily.     pantoprazole (PROTONIX) 40 MG tablet Take 1 tablet (40 mg total) by mouth daily. 90 tablet 3   pramipexole (MIRAPEX) 0.75 MG tablet Take 1 tablet (0.75 mg total) by mouth 3 (three) times daily. 90 tablet 4   prochlorperazine (COMPAZINE) 10 MG tablet Take 1 tablet (10 mg total) by mouth every 6 (six) hours as needed (Nausea or vomiting). 30 tablet 1   RYBELSUS 7 MG TABS Take 7 mg by mouth daily.     tamsulosin (FLOMAX) 0.4 MG CAPS capsule Take 0.4 mg by mouth daily.     traMADol (ULTRAM) 50 MG tablet Take 50 mg by mouth 4 (four) times daily as needed.     traZODone (DESYREL) 100 MG tablet Take 1 tablet (100 mg total) by mouth at bedtime. 30 tablet 0   triamcinolone cream (KENALOG) 0.1 % SMARTSIG:1 Application Topical 2-3 Times Daily     zaleplon (SONATA) 10 MG capsule Take 1 capsule (10 mg total) by mouth at bedtime as needed for  sleep. 30 capsule 0   No current facility-administered medications for this visit.    ALLERGIES:  No Known Allergies  PHYSICAL EXAM:  Performance status (ECOG): 0 - Asymptomatic  There were no vitals filed for this visit. Wt Readings from Last 3 Encounters:  02/15/22 263 lb 12.8 oz (119.7 kg)  02/01/22 255 lb 9.6 oz (115.9 kg)  01/25/22 257 lb 6.4 oz (116.8 kg)   Physical Exam Vitals reviewed.  Constitutional:      Appearance: Normal appearance. He is obese.  Cardiovascular:     Rate and Rhythm: Normal rate and regular rhythm.     Pulses: Normal pulses.     Heart sounds: Normal heart sounds.  Pulmonary:     Effort: Pulmonary effort is normal.     Breath sounds: Normal breath sounds.  Neurological:     General: No focal deficit present.     Mental Status: He is alert and oriented to person, place, and time.  Psychiatric:        Mood and Affect: Mood normal.        Behavior: Behavior normal.    LABORATORY DATA:  I have reviewed the labs as listed.     Latest Ref Rng & Units 02/01/2022    7:48 AM 01/25/2022    7:52 AM 01/04/2022    8:04 AM  CBC  WBC 4.0 - 10.5 K/uL 2.9   4.8   2.6    Hemoglobin 13.0 - 17.0 g/dL 9.6   9.9   8.8    Hematocrit 39.0 - 52.0 % 28.5   30.5   26.2    Platelets 150 - 400 K/uL 167   266   225        Latest Ref Rng & Units 02/01/2022    7:48 AM 01/25/2022    7:52 AM 01/04/2022    8:04 AM  CMP  Glucose  70 - 99 mg/dL 121   118   106    BUN 8 - 23 mg/dL _0 Creatinine 0.61 - 1.24 mg/dL 1.19   1.08   1.15    Sodium 135 - 145 mmol/L 138   137   136    Potassium 3.5 - 5.1 mmol/L 3.8   3.9   4.2    Chloride 98 - 111 mmol/L 107   104   103    CO2 22 - 32 mmol/L _1 Calcium 8.9 - 10.3 mg/dL 9.0   9.4   9.3    Total Protein 6.5 - 8.1 g/dL 6.2   6.2   6.4    Total Bilirubin 0.3 - 1.2 mg/dL 0.2   0.7   0.4    Alkaline Phos 38 - 126 U/L 75   82   74    AST 15 - 41 U/L _2 ALT 0 - 44 U/L _3 DIAGNOSTIC IMAGING:  I have independently reviewed the scans and discussed with the patient. No results found.   ASSESSMENT:  Liver lesion/peritoneal carcinomatosis: - Patient seen at the request of Dr. Delphina Cahill - Reported pressure on the sides of the abdomen with slight pain for the last 1 month.  He also reported pain in the epigastric region. - He had lost 20 pounds in the last 3 to 4 months intentionally, cutting back on sugars.  He was also started on semaglutide. - Colonoscopy on 06/28/2011 with a benign polypoid colonic mucosa in the descending colon.  No evidence of malignancy. - MRI of the brain was negative. - EGD and colonoscopy on 09/23/2018 did not reveal any malignancies. 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  IHC positive for CK7, CDX2 and negative for GATA3.  Findings suggestive of upper GI or pancreaticobiliary primary. - Cycle 1 of gemcitabine, cisplatin and durvalumab started on 10/05/2021. - NGS: No targetable mutations.  PD-L1 (ZH086) negative.  MSI-stable.  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   Social/family history: - He lives at home by himself.  He records voiceover/narrations. - Non-smoker. - Father died of MDS.  Paternal grandfather had prostate cancer.  Maternal grandfather had cancer.  3.  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 Alinda Money.  Reportedly received 1 treatment of intravesical chemo and has been on surveillance since then.  Last surveillance visit was in 2020.   PLAN:  Cholangiocarcinoma with peritoneal carcinomatosis: - CT CAP (12/20/2021) with positive response. - Last CEA was 2.3 on 02/01/2022. - Chronic ringing in the ears is stable.  Numbness in the toes left more than right is also stable over the years.  He had fatigue lasting about 2 to 3 days after each chemotherapy.  Denies any other GI side effects.  Dyspnea on exertion is slightly worse. - Reviewed labs  which showed normal LFTs and creatinine.  CBC was grossly normal. - Proceed with cycle 7 today.  RTC 3 weeks for follow-up.  After cycle 8 we might switch him to day 1 and day 15 regimen after completion of CT scan.   Hypertension: - He is currently on Benicar 20 mg daily.  Blood pressure today is 170/84. - Will increase Benicar to 40 mg daily.  3.  Neuropathy/"drawing" of legs: -  Continue Mirapex 0.75 mg 3 times daily.  4.  Sleeping difficulty: - Trazodone and Sonata did not help.  Continue Lunesta as needed.  5.  Hypomagnesemia: - Continue magnesium twice daily.  Magnesium is normal.   Orders placed this encounter:  No orders of the defined types were placed in this encounter.    Derek Jack, MD [facility] [phone]   I, Thana Ates, am acting as a scribe for Dr. Derek Jack.  I, Derek Jack MD, have reviewed the above documentation for accuracy and completeness, and I agree with the above.

## 2022-02-22 ENCOUNTER — Inpatient Hospital Stay (HOSPITAL_COMMUNITY): Payer: Medicare Other

## 2022-02-22 VITALS — BP 175/96 | HR 79 | Temp 98.6°F | Resp 18

## 2022-02-22 DIAGNOSIS — Z95828 Presence of other vascular implants and grafts: Secondary | ICD-10-CM

## 2022-02-22 DIAGNOSIS — C221 Intrahepatic bile duct carcinoma: Secondary | ICD-10-CM

## 2022-02-22 DIAGNOSIS — Z5111 Encounter for antineoplastic chemotherapy: Secondary | ICD-10-CM | POA: Diagnosis not present

## 2022-02-22 LAB — CBC WITH DIFFERENTIAL/PLATELET
Abs Immature Granulocytes: 0.02 10*3/uL (ref 0.00–0.07)
Basophils Absolute: 0 10*3/uL (ref 0.0–0.1)
Basophils Relative: 1 %
Eosinophils Absolute: 0.1 10*3/uL (ref 0.0–0.5)
Eosinophils Relative: 5 %
HCT: 30.1 % — ABNORMAL LOW (ref 39.0–52.0)
Hemoglobin: 10.3 g/dL — ABNORMAL LOW (ref 13.0–17.0)
Immature Granulocytes: 1 %
Lymphocytes Relative: 39 %
Lymphs Abs: 1 10*3/uL (ref 0.7–4.0)
MCH: 35.5 pg — ABNORMAL HIGH (ref 26.0–34.0)
MCHC: 34.2 g/dL (ref 30.0–36.0)
MCV: 103.8 fL — ABNORMAL HIGH (ref 80.0–100.0)
Monocytes Absolute: 0.5 10*3/uL (ref 0.1–1.0)
Monocytes Relative: 18 %
Neutro Abs: 0.9 10*3/uL — ABNORMAL LOW (ref 1.7–7.7)
Neutrophils Relative %: 36 %
Platelets: 172 10*3/uL (ref 150–400)
RBC: 2.9 MIL/uL — ABNORMAL LOW (ref 4.22–5.81)
RDW: 12.9 % (ref 11.5–15.5)
WBC: 2.6 10*3/uL — ABNORMAL LOW (ref 4.0–10.5)
nRBC: 0 % (ref 0.0–0.2)

## 2022-02-22 LAB — COMPREHENSIVE METABOLIC PANEL
ALT: 16 U/L (ref 0–44)
AST: 18 U/L (ref 15–41)
Albumin: 3.7 g/dL (ref 3.5–5.0)
Alkaline Phosphatase: 81 U/L (ref 38–126)
Anion gap: 6 (ref 5–15)
BUN: 31 mg/dL — ABNORMAL HIGH (ref 8–23)
CO2: 25 mmol/L (ref 22–32)
Calcium: 9.2 mg/dL (ref 8.9–10.3)
Chloride: 105 mmol/L (ref 98–111)
Creatinine, Ser: 1.05 mg/dL (ref 0.61–1.24)
GFR, Estimated: >60 mL/min (ref >60)
Glucose, Bld: 134 mg/dL — ABNORMAL HIGH (ref 70–99)
Potassium: 4.1 mmol/L (ref 3.5–5.1)
Sodium: 136 mmol/L (ref 135–145)
Total Bilirubin: 0.2 mg/dL — ABNORMAL LOW (ref 0.3–1.2)
Total Protein: 6.2 g/dL — ABNORMAL LOW (ref 6.5–8.1)

## 2022-02-22 LAB — PATHOLOGIST SMEAR REVIEW

## 2022-02-22 LAB — MAGNESIUM: Magnesium: 1.6 mg/dL — ABNORMAL LOW (ref 1.7–2.4)

## 2022-02-22 MED ORDER — SODIUM CHLORIDE 0.9 % IV SOLN
25.0000 mg/m2 | Freq: Once | INTRAVENOUS | Status: AC
  Administered 2022-02-22: 62 mg via INTRAVENOUS
  Filled 2022-02-22: qty 62

## 2022-02-22 MED ORDER — SODIUM CHLORIDE 0.9 % IV SOLN: Freq: Once | INTRAVENOUS | Status: AC

## 2022-02-22 MED ORDER — HEPARIN SOD (PORK) LOCK FLUSH 100 UNIT/ML IV SOLN
500.0000 [IU] | Freq: Once | INTRAVENOUS | Status: AC | PRN
  Administered 2022-02-22: 500 [IU]

## 2022-02-22 MED ORDER — SODIUM CHLORIDE 0.9 % IV SOLN
1000.0000 mg/m2 | Freq: Once | INTRAVENOUS | Status: AC
  Administered 2022-02-22: 2470 mg via INTRAVENOUS
  Filled 2022-02-22: qty 64.96

## 2022-02-22 MED ORDER — SODIUM CHLORIDE 0.9 % IV SOLN
150.0000 mg | Freq: Once | INTRAVENOUS | Status: AC
  Administered 2022-02-22: 150 mg via INTRAVENOUS
  Filled 2022-02-22: qty 150

## 2022-02-22 MED ORDER — PALONOSETRON HCL INJECTION 0.25 MG/5ML
0.2500 mg | Freq: Once | INTRAVENOUS | Status: AC
  Administered 2022-02-22: 0.25 mg via INTRAVENOUS
  Filled 2022-02-22: qty 5

## 2022-02-22 MED ORDER — MAGNESIUM SULFATE 2 GM/50ML IV SOLN
2.0000 g | Freq: Once | INTRAVENOUS | Status: AC
  Administered 2022-02-22: 2 g via INTRAVENOUS
  Filled 2022-02-22: qty 50

## 2022-02-22 MED ORDER — POTASSIUM CHLORIDE IN NACL 20-0.9 MEQ/L-% IV SOLN
Freq: Once | INTRAVENOUS | Status: AC
  Filled 2022-02-22: qty 1000

## 2022-02-22 MED ORDER — SODIUM CHLORIDE 0.9 % IV SOLN
10.0000 mg | Freq: Once | INTRAVENOUS | Status: AC
  Administered 2022-02-22: 10 mg via INTRAVENOUS
  Filled 2022-02-22: qty 10

## 2022-02-22 MED ORDER — SODIUM CHLORIDE 0.9% FLUSH
10.0000 mL | INTRAVENOUS | Status: DC | PRN
  Administered 2022-02-22: 10 mL

## 2022-02-22 NOTE — Patient Instructions (Signed)
[facility]  Discharge Instructions: Thank you for choosing [facility] to provide your oncology and hematology care.  If you have a lab appointment with the [facility], please come in thru the Main Entrance and check in at the main information desk.  Wear comfortable clothing and clothing appropriate for easy access to any Portacath or PICC line.   We strive to give you quality time with your provider. You may need to reschedule your appointment if you arrive late (15 or more minutes).  Arriving late affects you and other patients whose appointments are after yours.  Also, if you miss three or more appointments without notifying the office, you may be dismissed from the clinic at the provider's discretion.      For prescription refill requests, have your pharmacy contact our office and allow 72 hours for refills to be completed.    Today you received the following chemotherapy and/or immunotherapy agents Gemzar/Cisplatin.  Gemcitabine injection What is this medication? GEMCITABINE (jem SYE ta been) is a chemotherapy drug. This medicine is used to treat many types of cancer like breast cancer, lung cancer, pancreatic cancer, and ovarian cancer. This medicine may be used for other purposes; ask your health care provider or pharmacist if you have questions. COMMON BRAND NAME(S): Gemzar, Infugem What should I tell my care team before I take this medication? They need to know if you have any of these conditions: blood disorders infection kidney disease liver disease lung or breathing disease, like asthma recent or ongoing radiation therapy an unusual or allergic reaction to gemcitabine, other chemotherapy, other medicines, foods, dyes, or preservatives pregnant or trying to get pregnant breast-feeding How should I use this medication? This drug is given as an infusion into a vein. It is administered in a hospital or clinic by a specially trained health care  professional. Talk to your pediatrician regarding the use of this medicine in children. Special care may be needed. Overdosage: If you think you have taken too much of this medicine contact a poison control center or emergency room at once. NOTE: This medicine is only for you. Do not share this medicine with others. What if I miss a dose? It is important not to miss your dose. Call your doctor or health care professional if you are unable to keep an appointment. What may interact with this medication? medicines to increase blood counts like filgrastim, pegfilgrastim, sargramostim some other chemotherapy drugs like cisplatin vaccines Talk to your doctor or health care professional before taking any of these medicines: acetaminophen aspirin ibuprofen ketoprofen naproxen This list may not describe all possible interactions. Give your health care provider a list of all the medicines, herbs, non-prescription drugs, or dietary supplements you use. Also tell them if you smoke, drink alcohol, or use illegal drugs. Some items may interact with your medicine. What should I watch for while using this medication? Visit your doctor for checks on your progress. This drug may make you feel generally unwell. This is not uncommon, as chemotherapy can affect healthy cells as well as cancer cells. Report any side effects. Continue your course of treatment even though you feel ill unless your doctor tells you to stop. In some cases, you may be given additional medicines to help with side effects. Follow all directions for their use. Call your doctor or health care professional for advice if you get a fever, chills or sore throat, or other symptoms of a cold or flu. Do not treat yourself.  yourself. This drug decreases your body's ability to fight infections. Try to avoid being around people who are sick. This medicine may increase your risk to bruise or bleed. Call your doctor or health care professional if you notice any  unusual bleeding. Be careful brushing and flossing your teeth or using a toothpick because you may get an infection or bleed more easily. If you have any dental work done, tell your dentist you are receiving this medicine. Avoid taking products that contain aspirin, acetaminophen, ibuprofen, naproxen, or ketoprofen unless instructed by your doctor. These medicines may hide a fever. Do not become pregnant while taking this medicine or for 6 months after stopping it. Women should inform their doctor if they wish to become pregnant or think they might be pregnant. Men should not father a child while taking this medicine and for 3 months after stopping it. There is a potential for serious side effects to an unborn child. Talk to your health care professional or pharmacist for more information. Do not breast-feed an infant while taking this medicine or for at least 1 week after stopping it. Men should inform their doctors if they wish to father a child. This medicine may lower sperm counts. Talk with your doctor or health care professional if you are concerned about your fertility. What side effects may I notice from receiving this medication? Side effects that you should report to your doctor or health care professional as soon as possible: allergic reactions like skin rash, itching or hives, swelling of the face, lips, or tongue breathing problems pain, redness, or irritation at site where injected signs and symptoms of a dangerous change in heartbeat or heart rhythm like chest pain; dizziness; fast or irregular heartbeat; palpitations; feeling faint or lightheaded, falls; breathing problems signs of decreased platelets or bleeding - bruising, pinpoint red spots on the skin, black, tarry stools, blood in the urine signs of decreased red blood cells - unusually weak or tired, feeling faint or lightheaded, falls signs of infection - fever or chills, cough, sore throat, pain or difficulty passing urine signs  and symptoms of kidney injury like trouble passing urine or change in the amount of urine signs and symptoms of liver injury like dark yellow or [place] urine; general ill feeling or flu-like symptoms; light-colored stools; loss of appetite; nausea; right upper belly pain; unusually weak or tired; yellowing of the eyes or skin swelling of ankles, feet, hands Side effects that usually do not require medical attention (report to your doctor or health care professional if they continue or are bothersome): constipation diarrhea hair loss loss of appetite nausea rash vomiting This list may not describe all possible side effects. Call your doctor for medical advice about side effects. You may report side effects to FDA at 1-800-FDA-1088. Where should I keep my medication? This drug is given in a hospital or clinic and will not be stored at home. NOTE: This sheet is a summary. It may not cover all possible information. If you have questions about this medicine, talk to your doctor, pharmacist, or health care provider.  2023 Elsevier/Gold Standard (2017-11-21 00:00:00)   Cisplatin injection What is this medication? CISPLATIN (SIS pla tin) is a chemotherapy drug. It targets fast dividing cells, like cancer cells, and causes these cells to die. This medicine is used to treat many types of cancer like bladder, ovarian, and testicular cancers. This medicine may be used for other purposes; ask your health care provider or pharmacist if you have questions. COMMON   BRAND NAME(S): Platinol, Platinol -AQ What should I tell my care team before I take this medication? They need to know if you have any of these conditions: eye disease, vision problems hearing problems kidney disease low blood counts, like white cells, platelets, or red blood cells tingling of the fingers or toes, or other nerve disorder an unusual or allergic reaction to cisplatin, carboplatin, oxaliplatin, other medicines, foods, dyes, or  preservatives pregnant or trying to get pregnant breast-feeding How should I use this medication? This drug is given as an infusion into a vein. It is administered in a hospital or clinic by a specially trained health care professional. Talk to your pediatrician regarding the use of this medicine in children. Special care may be needed. Overdosage: If you think you have taken too much of this medicine contact a poison control center or emergency room at once. NOTE: This medicine is only for you. Do not share this medicine with others. What if I miss a dose? It is important not to miss a dose. Call your doctor or health care professional if you are unable to keep an appointment. What may interact with this medication? This medicine may interact with the following medications: foscarnet certain antibiotics like amikacin, gentamicin, neomycin, polymyxin B, streptomycin, tobramycin, vancomycin This list may not describe all possible interactions. Give your health care provider a list of all the medicines, herbs, non-prescription drugs, or dietary supplements you use. Also tell them if you smoke, drink alcohol, or use illegal drugs. Some items may interact with your medicine. What should I watch for while using this medication? Your condition will be monitored carefully while you are receiving this medicine. You will need important blood work done while you are taking this medicine. This drug may make you feel generally unwell. This is not uncommon, as chemotherapy can affect healthy cells as well as cancer cells. Report any side effects. Continue your course of treatment even though you feel ill unless your doctor tells you to stop. This medicine may increase your risk of getting an infection. Call your healthcare professional for advice if you get a fever, chills, or sore throat, or other symptoms of a cold or flu. Do not treat yourself. Try to avoid being around people who are sick. Avoid taking  medicines that contain aspirin, acetaminophen, ibuprofen, naproxen, or ketoprofen unless instructed by your healthcare professional. These medicines may hide a fever. This medicine may increase your risk to bruise or bleed. Call your doctor or health care professional if you notice any unusual bleeding. Be careful brushing and flossing your teeth or using a toothpick because you may get an infection or bleed more easily. If you have any dental work done, tell your dentist you are receiving this medicine. Do not become pregnant while taking this medicine or for 14 months after stopping it. Women should inform their healthcare professional if they wish to become pregnant or think they might be pregnant. Men should not father a child while taking this medicine and for 11 months after stopping it. There is potential for serious side effects to an unborn child. Talk to your healthcare professional for more information. Do not breast-feed an infant while taking this medicine. This medicine has caused ovarian failure in some women. This medicine may make it more difficult to get pregnant. Talk to your healthcare professional if you are concerned about your fertility. This medicine has caused decreased sperm counts in some men. This may make it more difficult to father a   Talk to your healthcare professional if you are concerned about your fertility. Drink fluids as directed while you are taking this medicine. This will help protect your kidneys. Call your doctor or health care professional if you get diarrhea. Do not treat yourself. What side effects may I notice from receiving this medication? Side effects that you should report to your doctor or health care professional as soon as possible: allergic reactions like skin rash, itching or hives, swelling of the face, lips, or tongue blurred vision changes in vision decreased hearing or ringing of the ears nausea, vomiting pain, redness, or irritation  at site where injected pain, tingling, numbness in the hands or feet signs and symptoms of bleeding such as bloody or black, tarry stools; red or dark [place] urine; spitting up blood or [place] material that looks like coffee grounds; red spots on the skin; unusual bruising or bleeding from the eyes, gums, or nose signs and symptoms of infection like fever; chills; cough; sore throat; pain or trouble passing urine signs and symptoms of kidney injury like trouble passing urine or change in the amount of urine signs and symptoms of low red blood cells or anemia such as unusually weak or tired; feeling faint or lightheaded; falls; breathing problems Side effects that usually do not require medical attention (report to your doctor or health care professional if they continue or are bothersome): loss of appetite mouth sores muscle cramps This list may not describe all possible side effects. Call your doctor for medical advice about side effects. You may report side effects to FDA at 1-800-FDA-1088. Where should I keep my medication? This drug is given in a hospital or clinic and will not be stored at home. NOTE: This sheet is a summary. It may not cover all possible information. If you have questions about this medicine, talk to your doctor, pharmacist, or health care provider.  2023 Elsevier/Gold Standard (2021-07-29 00:00:00)         To help prevent nausea and vomiting after your treatment, we encourage you to take your nausea medication as directed.  BELOW ARE SYMPTOMS THAT SHOULD BE REPORTED IMMEDIATELY: *FEVER GREATER THAN 100.4 F (38 C) OR HIGHER *CHILLS OR SWEATING *NAUSEA AND VOMITING THAT IS NOT CONTROLLED WITH YOUR NAUSEA MEDICATION *UNUSUAL SHORTNESS OF BREATH *UNUSUAL BRUISING OR BLEEDING *URINARY PROBLEMS (pain or burning when urinating, or frequent urination) *BOWEL PROBLEMS (unusual diarrhea, constipation, pain near the anus) TENDERNESS IN MOUTH AND THROAT WITH OR WITHOUT  PRESENCE OF ULCERS (sore throat, sores in mouth, or a toothache) UNUSUAL RASH, SWELLING OR PAIN  UNUSUAL VAGINAL DISCHARGE OR ITCHING   Items with * indicate a potential emergency and should be followed up as soon as possible or go to the Emergency Department if any problems should occur.  Please show the CHEMOTHERAPY ALERT CARD or IMMUNOTHERAPY ALERT CARD at check-in to the Emergency Department and triage nurse.  Should you have questions after your visit or need to cancel or reschedule your appointment, please contact [facility] [phone]  and follow the prompts.  Office hours are 8:00 a.m. to 4:30 p.m. Monday - Friday. Please note that voicemails left after 4:00 p.m. may not be returned until the following business day.  We are closed weekends and major holidays. You have access to a nurse at all times for urgent questions. Please call the main number to the clinic [phone] and follow the prompts.  For any non-urgent questions, you may also contact your provider using MyChart. We now  offer e-Visits for anyone 76 and older to request care online for non-urgent symptoms. For details visit [email or web address].   Also download the MyChart app! Go to the app store, search "MyChart", open the app, select [facility], and log in with your MyChart username and password.  Masks are optional in the cancer centers. If you would like for your care team to wear a mask while they are taking care of you, please let them know. For doctor visits, patients may have with them one support person who is at least 67 years old. At this time, visitors are not allowed in the infusion area.

## 2022-02-22 NOTE — Progress Notes (Signed)
Patient presents today for chemotherapy infusion.  Patient is in satisfactory condition with no new complaints voiced.  Vital signs are stable.  Labs reviewed.  ANC is 0.9 today.  MD made aware.  He will receive Udenyca injection on 02/24/22.  All other labs are within treatment parameters. We will proceed with treatment per MD orders.   Urine output of 225 mL prior to starting Cisplatin.   Patient tolerated treatment well with no complaints voiced.  Patient left ambulatory in stable condition.  Vital signs stable at discharge.  Follow up as scheduled.

## 2022-02-23 ENCOUNTER — Encounter (HOSPITAL_COMMUNITY): Payer: Self-pay

## 2022-02-24 ENCOUNTER — Inpatient Hospital Stay (HOSPITAL_COMMUNITY): Payer: Medicare Other

## 2022-02-24 VITALS — BP 148/90 | HR 73 | Temp 97.8°F | Resp 18

## 2022-02-24 DIAGNOSIS — C221 Intrahepatic bile duct carcinoma: Secondary | ICD-10-CM

## 2022-02-24 DIAGNOSIS — Z5111 Encounter for antineoplastic chemotherapy: Secondary | ICD-10-CM | POA: Diagnosis not present

## 2022-02-24 DIAGNOSIS — Z95828 Presence of other vascular implants and grafts: Secondary | ICD-10-CM

## 2022-02-24 MED ORDER — PEGFILGRASTIM-CBQV 6 MG/0.6ML ~~LOC~~ SOSY
6.0000 mg | PREFILLED_SYRINGE | Freq: Once | SUBCUTANEOUS | Status: AC
  Administered 2022-02-24: 6 mg via SUBCUTANEOUS
  Filled 2022-02-24: qty 0.6

## 2022-02-24 NOTE — Patient Instructions (Signed)
[facility]  Discharge Instructions: Thank you for choosing [facility] to provide your oncology and hematology care.  If you have a lab appointment with the Cancer Center, please come in thru the Main Entrance and check in at the main information desk.  Wear comfortable clothing and clothing appropriate for easy access to any Portacath or PICC line.   We strive to give you quality time with your provider. You may need to reschedule your appointment if you arrive late (15 or more minutes).  Arriving late affects you and other patients whose appointments are after yours.  Also, if you miss three or more appointments without notifying the office, you may be dismissed from the clinic at the provider's discretion.      For prescription refill requests, have your pharmacy contact our office and allow 72 hours for refills to be completed.    Today you received the following chemotherapy and/or immunotherapy agents Udenyca      To help prevent nausea and vomiting after your treatment, we encourage you to take your nausea medication as directed.  BELOW ARE SYMPTOMS THAT SHOULD BE REPORTED IMMEDIATELY: *FEVER GREATER THAN 100.4 F (38 C) OR HIGHER *CHILLS OR SWEATING *NAUSEA AND VOMITING THAT IS NOT CONTROLLED WITH YOUR NAUSEA MEDICATION *UNUSUAL SHORTNESS OF BREATH *UNUSUAL BRUISING OR BLEEDING *URINARY PROBLEMS (pain or burning when urinating, or frequent urination) *BOWEL PROBLEMS (unusual diarrhea, constipation, pain near the anus) TENDERNESS IN MOUTH AND THROAT WITH OR WITHOUT PRESENCE OF ULCERS (sore throat, sores in mouth, or a toothache) UNUSUAL RASH, SWELLING OR PAIN  UNUSUAL VAGINAL DISCHARGE OR ITCHING   Items with * indicate a potential emergency and should be followed up as soon as possible or go to the Emergency Department if any problems should occur.  Please show the CHEMOTHERAPY ALERT CARD or IMMUNOTHERAPY ALERT CARD at check-in to the Emergency  Department and triage nurse.  Should you have questions after your visit or need to cancel or reschedule your appointment, please contact  CANCER CENTER [phone]  and follow the prompts.  Office hours are 8:00 a.m. to 4:30 p.m. Monday - Friday. Please note that voicemails left after 4:00 p.m. may not be returned until the following business day.  We are closed weekends and major holidays. You have access to a nurse at all times for urgent questions. Please call the main number to the clinic [phone] and follow the prompts.  For any non-urgent questions, you may also contact your provider using MyChart. We now offer e-Visits for anyone 18 and older to request care online for non-urgent symptoms. For details visit [email or web address].   Also download the MyChart app! Go to the app store, search "MyChart", open the app, select [facility], and log in with your MyChart username and password.  Masks are optional in the cancer centers. If you would like for your care team to wear a mask while they are taking care of you, please let them know. For doctor visits, patients may have with them one support person who is at least 67 years old. At this time, visitors are not allowed in the infusion area.  

## 2022-02-24 NOTE — Progress Notes (Signed)
Marvin Carr presents today for injection per the provider's orders.  Udenyca administration without incident; injection site WNL; see MAR for injection details.  Patient tolerated procedure well and without incident.  No questions or complaints noted at this time.

## 2022-02-28 ENCOUNTER — Ambulatory Visit (HOSPITAL_COMMUNITY)
Admission: RE | Admit: 2022-02-28 | Discharge: 2022-02-28 | Disposition: A | Discharge status: Home / Self Care | Payer: Medicare Other | Source: Ambulatory Visit | Attending: Physician Assistant | Admitting: Physician Assistant

## 2022-02-28 ENCOUNTER — Inpatient Hospital Stay (HOSPITAL_BASED_OUTPATIENT_CLINIC_OR_DEPARTMENT_OTHER): Payer: Medicare Other | Admitting: Physician Assistant

## 2022-02-28 ENCOUNTER — Inpatient Hospital Stay (HOSPITAL_COMMUNITY): Payer: Medicare Other

## 2022-02-28 ENCOUNTER — Other Ambulatory Visit (HOSPITAL_COMMUNITY): Payer: Self-pay | Admitting: *Deleted

## 2022-02-28 ENCOUNTER — Other Ambulatory Visit (HOSPITAL_COMMUNITY): Payer: Self-pay | Admitting: Physician Assistant

## 2022-02-28 ENCOUNTER — Encounter (HOSPITAL_COMMUNITY): Payer: Self-pay

## 2022-02-28 VITALS — BP 150/83 | HR 81 | Temp 98.3°F | Resp 18 | Ht 72.0 in | Wt 261.0 lb

## 2022-02-28 DIAGNOSIS — C787 Secondary malignant neoplasm of liver and intrahepatic bile duct: Secondary | ICD-10-CM | POA: Diagnosis not present

## 2022-02-28 DIAGNOSIS — C221 Intrahepatic bile duct carcinoma: Secondary | ICD-10-CM

## 2022-02-28 DIAGNOSIS — R0602 Shortness of breath: Secondary | ICD-10-CM | POA: Diagnosis present

## 2022-02-28 LAB — CBC WITH DIFFERENTIAL/PLATELET
Abs Immature Granulocytes: 0.84 10*3/uL — ABNORMAL HIGH (ref 0.00–0.07)
Basophils Absolute: 0.2 10*3/uL — ABNORMAL HIGH (ref 0.0–0.1)
Basophils Relative: 1 %
Eosinophils Absolute: 0.1 10*3/uL (ref 0.0–0.5)
Eosinophils Relative: 0 %
HCT: 31.2 % — ABNORMAL LOW (ref 39.0–52.0)
Hemoglobin: 10.6 g/dL — ABNORMAL LOW (ref 13.0–17.0)
Immature Granulocytes: 5 %
Lymphocytes Relative: 8 %
Lymphs Abs: 1.5 10*3/uL (ref 0.7–4.0)
MCH: 35.5 pg — ABNORMAL HIGH (ref 26.0–34.0)
MCHC: 34 g/dL (ref 30.0–36.0)
MCV: 104.3 fL — ABNORMAL HIGH (ref 80.0–100.0)
Monocytes Absolute: 2 10*3/uL — ABNORMAL HIGH (ref 0.1–1.0)
Monocytes Relative: 11 %
Neutro Abs: 14.2 10*3/uL — ABNORMAL HIGH (ref 1.7–7.7)
Neutrophils Relative %: 75 %
Platelets: 96 10*3/uL — ABNORMAL LOW (ref 150–400)
RBC: 2.99 MIL/uL — ABNORMAL LOW (ref 4.22–5.81)
RDW: 13.3 % (ref 11.5–15.5)
WBC: 18.8 10*3/uL — ABNORMAL HIGH (ref 4.0–10.5)
nRBC: 0.1 % (ref 0.0–0.2)

## 2022-02-28 LAB — COMPREHENSIVE METABOLIC PANEL
ALT: 20 U/L (ref 0–44)
AST: 22 U/L (ref 15–41)
Albumin: 3.9 g/dL (ref 3.5–5.0)
Alkaline Phosphatase: 145 U/L — ABNORMAL HIGH (ref 38–126)
Anion gap: 6 (ref 5–15)
BUN: 27 mg/dL — ABNORMAL HIGH (ref 8–23)
CO2: 28 mmol/L (ref 22–32)
Calcium: 9.4 mg/dL (ref 8.9–10.3)
Chloride: 102 mmol/L (ref 98–111)
Creatinine, Ser: 1.18 mg/dL (ref 0.61–1.24)
GFR, Estimated: >60 mL/min (ref >60)
Glucose, Bld: 156 mg/dL — ABNORMAL HIGH (ref 70–99)
Potassium: 4.1 mmol/L (ref 3.5–5.1)
Sodium: 136 mmol/L (ref 135–145)
Total Bilirubin: 0.2 mg/dL — ABNORMAL LOW (ref 0.3–1.2)
Total Protein: 6.4 g/dL — ABNORMAL LOW (ref 6.5–8.1)

## 2022-02-28 LAB — MAGNESIUM: Magnesium: 1.5 mg/dL — ABNORMAL LOW (ref 1.7–2.4)

## 2022-02-28 NOTE — Telephone Encounter (Signed)
Is this a patient that you may want to take on for symptom management?

## 2022-02-28 NOTE — Telephone Encounter (Signed)
Have him come in for CBC, CMP, Magnesium check this afternoon. Also a chest Xray. I will see him at 3:30 PM today.

## 2022-02-28 NOTE — Patient Instructions (Signed)
[place] at [facility] Discharge Instructions  You were seen today by Tarri Abernethy PA-C for your symptom management visit.  LOW MAGNESIUM: Increase your magnesium to 400 mg 2 tablets twice daily (total of 4 tablets daily).  COUGH & SHORTNESS OF BREATH: We will check CT scan of your chest to see if you have any inflammation in your lungs from your cancer treatment.  ABDOMINAL PAIN & TIGHTNESS: We will check CT scan of your abdomen and pelvis to see if you have any reaccumulation of fluid.  **If there are any results requiring immediate response, we will call you.  Otherwise, results will be discussed with you at your follow-up visit with Dr. Delton Coombes next week.   - - - - - - - - - - - - - -   Thank you for choosing [place] at [facility] to provide your oncology and hematology care.  To afford each patient quality time with our provider, please arrive at least 15 minutes before your scheduled appointment time.   If you have a lab appointment with the [facility] please come in thru the Main Entrance and check in at the main information desk.  You need to re-schedule your appointment should you arrive 10 or more minutes late.  We strive to give you quality time with our providers, and arriving late affects you and other patients whose appointments are after yours.  Also, if you no show three or more times for appointments you may be dismissed from the clinic at the providers discretion.     Again, thank you for choosing [facility].  Our hope is that these requests will decrease the amount of time that you wait before being seen by our physicians.       _____________________________________________________________  Should you have questions after your visit to [facility], please contact our office at [phone] and follow the prompts.  Our office hours are 8:00 a.m. and 4:30 p.m. Monday - Friday.   Please note that voicemails left after 4:00 p.m. may not be returned until the following business day.  We are closed weekends and major holidays.  You do have access to a nurse 24-7, just call the main number to the clinic [phone] and do not press any options, hold on the line and a nurse will answer the phone.    For prescription refill requests, have your pharmacy contact our office and allow 72 hours.    Due to Covid, you will need to wear a mask upon entering the hospital. If you do not have a mask, a mask will be given to you at the Main Entrance upon arrival. For doctor visits, patients may have 1 support person age 53 or older with them. For treatment visits, patients can not have anyone with them due to social distancing guidelines and our immunocompromised population.

## 2022-02-28 NOTE — Telephone Encounter (Signed)
Patient is scheduled   

## 2022-02-28 NOTE — Progress Notes (Signed)
[facility] [address], [place] [address] Phone: [phone] Fax: [place] PROGRESS NOTE   Mcihael Carr [id number] 1956/04/17 66 y.o.  Marvin Carr is managed by Dr. 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  Patient contacted triage nurse today with complaints of abdominal pain and shortness of breath, therefore seen for in person evaluation and symptom management visit.  Patient reports that his abdomen feels tight, with intermittent sharp pain in his left abdomen and a "tight pain" between his upper abdomen and ribs associated with a tingling sensation.  Patient reports that this is similar to the pain he experienced before starting chemoimmunotherapy, and he has noticed recurrence and progression of this pain for the past 2 to 3 days.  He "feels like he may be filling up with fluid again."  His last paracentesis was on 10/14/2021, with removal of 3 L of ascitic fluid.  He has not required any paracentesis since starting treatment.  He reports that for the past 2 weeks he has had forceful dry cough, shortness of breath, dyspnea on exertion, and chest tightness.  He denies any productive cough, fever, or chills.  He reports extreme fatigue and hypersomnolence.  He reports good appetite, and that he drinks 32 ounces of water daily.   Review of Systems:  Review of Systems  Constitutional:  Positive for fatigue. Negative for activity change, appetite change, chills, diaphoresis, fever  and unexpected weight change.  HENT:  Negative for mouth sores, nosebleeds, sore throat and trouble swallowing.   Respiratory:  Positive for cough, chest tightness and shortness of breath.   Cardiovascular:  Negative for chest pain, palpitations and leg swelling.  Gastrointestinal:  Positive for constipation and nausea. Negative for abdominal pain, blood in stool, diarrhea and vomiting.  Genitourinary:  Positive for frequency (Nocturia). Negative for dysuria and hematuria.  Neurological:  Positive for numbness and headaches. Negative for dizziness and light-headedness.  Psychiatric/Behavioral:  Positive for sleep disturbance. Negative for dysphoric mood. The patient is not nervous/anxious.      Past Medical History, Surgical history, Social history, and Family history were reviewed as documented elsewhere in chart, and were updated as appropriate.   Assessment & Plan:    1.  Cough and shortness of breath - Chest x-ray showed prominent bronchovascular markings, but no clear cardiopulmonary pathology per my review, pending radiology read. - PLAN: Discussed with Dr. Delton Coombes, will check CT chest to investigate for possible immunotherapy pneumonitis  2.  Abdominal pain and tightness - Patient reports the pain pattern is similar to pain prior to starting treatment, feels that he is "filling up with fluid again" - He has not needed any paracentesis since February 2023 - PLAN: Discussed with Dr. Delton Coombes, and we will check CT abdomen and pelvis at the same time as CT chest noted above.  We will arrange for paracentesis if needed.  3.  Hypomagnesemia - Magnesium 1.5 - PLAN: Increase magnesium to 800 mg twice daily   Objective:   Physical Exam:  There were no vitals taken for this visit. ECOG: 1  Physical Exam Constitutional:  Appearance: Normal appearance. He is obese.  HENT:     Head: Normocephalic and atraumatic.     Mouth/Throat:     Mouth: Mucous membranes are moist.   Eyes:     Extraocular Movements: Extraocular movements intact.     Pupils: Pupils are equal, round, and reactive to light.  Cardiovascular:     Rate and Rhythm: Normal rate and regular rhythm.     Pulses: Normal pulses.     Heart sounds: Normal heart sounds.  Pulmonary:     Effort: Pulmonary effort is normal.     Breath sounds: Normal breath sounds.  Abdominal:     General: Bowel sounds are normal. There is distension (mild).     Palpations: Abdomen is soft.     Tenderness: There is abdominal tenderness.  Musculoskeletal:        General: No swelling.     Right lower leg: No edema.     Left lower leg: No edema.  Lymphadenopathy:     Cervical: No cervical adenopathy.  Skin:    General: Skin is warm and dry.  Neurological:     General: No focal deficit present.     Mental Status: He is alert and oriented to person, place, and time.  Psychiatric:        Mood and Affect: Mood normal.        Behavior: Behavior normal.     Lab Review:     Component Value Date/Time   NA 136 02/22/2022 0752   K 4.1 02/22/2022 0752   CL 105 02/22/2022 0752   CO2 25 02/22/2022 0752   GLUCOSE 134 (H) 02/22/2022 0752   BUN 31 (H) 02/22/2022 0752   CREATININE 1.05 02/22/2022 0752   CALCIUM 9.2 02/22/2022 0752   PROT 6.2 (L) 02/22/2022 0752   ALBUMIN 3.7 02/22/2022 0752   AST 18 02/22/2022 0752   ALT 16 02/22/2022 0752   ALKPHOS 81 02/22/2022 0752   BILITOT 0.2 (L) 02/22/2022 0752   GFRNONAA >60 02/22/2022 0752   GFRAA >90 06/11/2014 0558       Component Value Date/Time   WBC 2.6 (L) 02/22/2022 0752   RBC 2.90 (L) 02/22/2022 0752   HGB 10.3 (L) 02/22/2022 0752   HCT 30.1 (L) 02/22/2022 0752   PLT 172 02/22/2022 0752   MCV 103.8 (H) 02/22/2022 0752   MCH 35.5 (H) 02/22/2022 0752   MCHC 34.2 02/22/2022 0752   RDW 12.9 02/22/2022 0752   LYMPHSABS 1.0 02/22/2022 0752   MONOABS 0.5 02/22/2022 0752   EOSABS 0.1 02/22/2022 0752   BASOSABS 0.0 02/22/2022 0752    -------------------------------  Imaging from last 24 hours (if applicable):  Radiology interpretation: No results found.    Wrap-Up:      All questions were answered. The patient knows to call the clinic with any problems, questions or concerns.  Medical decision making: Moderate  Time spent on visit: I spent 20 minutes counseling the patient face to face. The total time spent in the appointment was 30 minutes and more than 50% was on counseling.   Harriett Rush, PA-C  02/28/22 10:59 PM

## 2022-03-02 ENCOUNTER — Encounter (HOSPITAL_COMMUNITY): Payer: Self-pay

## 2022-03-04 ENCOUNTER — Ambulatory Visit (HOSPITAL_BASED_OUTPATIENT_CLINIC_OR_DEPARTMENT_OTHER)
Admission: RE | Admit: 2022-03-04 | Discharge: 2022-03-04 | Disposition: A | Discharge status: Home / Self Care | Payer: Medicare Other | Source: Ambulatory Visit | Attending: Physician Assistant | Admitting: Physician Assistant

## 2022-03-04 DIAGNOSIS — C221 Intrahepatic bile duct carcinoma: Secondary | ICD-10-CM | POA: Diagnosis present

## 2022-03-04 DIAGNOSIS — C787 Secondary malignant neoplasm of liver and intrahepatic bile duct: Secondary | ICD-10-CM | POA: Diagnosis present

## 2022-03-04 MED ORDER — IOHEXOL 300 MG/ML  SOLN
100.0000 mL | Freq: Once | INTRAMUSCULAR | Status: AC | PRN
  Administered 2022-03-04: 100 mL via INTRAVENOUS

## 2022-03-07 MED FILL — Fosaprepitant Dimeglumine For IV Infusion 150 MG (Base Eq): INTRAVENOUS | Qty: 5 | Status: AC

## 2022-03-07 MED FILL — Dexamethasone Sodium Phosphate Inj 100 MG/10ML: INTRAMUSCULAR | Qty: 1 | Status: AC

## 2022-03-08 ENCOUNTER — Inpatient Hospital Stay (HOSPITAL_BASED_OUTPATIENT_CLINIC_OR_DEPARTMENT_OTHER): Payer: Medicare Other | Admitting: Hematology

## 2022-03-08 ENCOUNTER — Inpatient Hospital Stay (HOSPITAL_COMMUNITY): Payer: Medicare Other

## 2022-03-08 VITALS — BP 128/74 | HR 73 | Temp 97.6°F | Resp 19

## 2022-03-08 DIAGNOSIS — C221 Intrahepatic bile duct carcinoma: Secondary | ICD-10-CM

## 2022-03-08 DIAGNOSIS — C787 Secondary malignant neoplasm of liver and intrahepatic bile duct: Secondary | ICD-10-CM | POA: Diagnosis not present

## 2022-03-08 DIAGNOSIS — Z95828 Presence of other vascular implants and grafts: Secondary | ICD-10-CM

## 2022-03-08 DIAGNOSIS — Z5111 Encounter for antineoplastic chemotherapy: Secondary | ICD-10-CM | POA: Diagnosis not present

## 2022-03-08 DIAGNOSIS — Z08 Encounter for follow-up examination after completed treatment for malignant neoplasm: Secondary | ICD-10-CM

## 2022-03-08 LAB — COMPREHENSIVE METABOLIC PANEL
ALT: 20 U/L (ref 0–44)
AST: 20 U/L (ref 15–41)
Albumin: 3.7 g/dL (ref 3.5–5.0)
Alkaline Phosphatase: 129 U/L — ABNORMAL HIGH (ref 38–126)
Anion gap: 7 (ref 5–15)
BUN: 41 mg/dL — ABNORMAL HIGH (ref 8–23)
CO2: 25 mmol/L (ref 22–32)
Calcium: 8.9 mg/dL (ref 8.9–10.3)
Chloride: 103 mmol/L (ref 98–111)
Creatinine, Ser: 1.15 mg/dL (ref 0.61–1.24)
GFR, Estimated: >60 mL/min (ref >60)
Glucose, Bld: 117 mg/dL — ABNORMAL HIGH (ref 70–99)
Potassium: 4 mmol/L (ref 3.5–5.1)
Sodium: 135 mmol/L (ref 135–145)
Total Bilirubin: 0.4 mg/dL (ref 0.3–1.2)
Total Protein: 6.4 g/dL — ABNORMAL LOW (ref 6.5–8.1)

## 2022-03-08 LAB — CBC WITH DIFFERENTIAL/PLATELET
Abs Immature Granulocytes: 0.11 10*3/uL — ABNORMAL HIGH (ref 0.00–0.07)
Basophils Absolute: 0.1 10*3/uL (ref 0.0–0.1)
Basophils Relative: 1 %
Eosinophils Absolute: 0.2 10*3/uL (ref 0.0–0.5)
Eosinophils Relative: 2 %
HCT: 31.1 % — ABNORMAL LOW (ref 39.0–52.0)
Hemoglobin: 10.7 g/dL — ABNORMAL LOW (ref 13.0–17.0)
Immature Granulocytes: 1 %
Lymphocytes Relative: 18 %
Lymphs Abs: 1.6 10*3/uL (ref 0.7–4.0)
MCH: 35.5 pg — ABNORMAL HIGH (ref 26.0–34.0)
MCHC: 34.4 g/dL (ref 30.0–36.0)
MCV: 103.3 fL — ABNORMAL HIGH (ref 80.0–100.0)
Monocytes Absolute: 1.2 10*3/uL — ABNORMAL HIGH (ref 0.1–1.0)
Monocytes Relative: 14 %
Neutro Abs: 5.4 10*3/uL (ref 1.7–7.7)
Neutrophils Relative %: 64 %
Platelets: 168 10*3/uL (ref 150–400)
RBC: 3.01 MIL/uL — ABNORMAL LOW (ref 4.22–5.81)
RDW: 13.7 % (ref 11.5–15.5)
WBC: 8.5 10*3/uL (ref 4.0–10.5)
nRBC: 0 % (ref 0.0–0.2)

## 2022-03-08 LAB — MAGNESIUM: Magnesium: 1.9 mg/dL (ref 1.7–2.4)

## 2022-03-08 MED ORDER — POTASSIUM CHLORIDE IN NACL 20-0.9 MEQ/L-% IV SOLN
Freq: Once | INTRAVENOUS | Status: AC
  Filled 2022-03-08: qty 1000

## 2022-03-08 MED ORDER — SODIUM CHLORIDE 0.9% FLUSH
10.0000 mL | INTRAVENOUS | Status: DC | PRN
  Administered 2022-03-08: 10 mL

## 2022-03-08 MED ORDER — SODIUM CHLORIDE 0.9 % IV SOLN
150.0000 mg | Freq: Once | INTRAVENOUS | Status: AC
  Administered 2022-03-08: 150 mg via INTRAVENOUS
  Filled 2022-03-08: qty 5

## 2022-03-08 MED ORDER — SODIUM CHLORIDE 0.9 % IV SOLN
1000.0000 mg/m2 | Freq: Once | INTRAVENOUS | Status: AC
  Administered 2022-03-08: 2470 mg via INTRAVENOUS
  Filled 2022-03-08: qty 64.96

## 2022-03-08 MED ORDER — SODIUM CHLORIDE 0.9 % IV SOLN: Freq: Once | INTRAVENOUS | Status: AC

## 2022-03-08 MED ORDER — SODIUM CHLORIDE 0.9 % IV SOLN
10.0000 mg | Freq: Once | INTRAVENOUS | Status: AC
  Administered 2022-03-08: 10 mg via INTRAVENOUS
  Filled 2022-03-08: qty 10

## 2022-03-08 MED ORDER — PALONOSETRON HCL INJECTION 0.25 MG/5ML
0.2500 mg | Freq: Once | INTRAVENOUS | Status: AC
  Administered 2022-03-08: 0.25 mg via INTRAVENOUS
  Filled 2022-03-08: qty 5

## 2022-03-08 MED ORDER — SODIUM CHLORIDE 0.9 % IV SOLN
25.0000 mg/m2 | Freq: Once | INTRAVENOUS | Status: AC
  Administered 2022-03-08: 62 mg via INTRAVENOUS
  Filled 2022-03-08: qty 62

## 2022-03-08 MED ORDER — MAGNESIUM SULFATE 2 GM/50ML IV SOLN
2.0000 g | Freq: Once | INTRAVENOUS | Status: AC
  Administered 2022-03-08: 2 g via INTRAVENOUS
  Filled 2022-03-08: qty 50

## 2022-03-08 MED ORDER — METHYLPREDNISOLONE 4 MG PO TBPK: ORAL_TABLET | ORAL | 0 refills | Status: DC

## 2022-03-08 MED ORDER — HEPARIN SOD (PORK) LOCK FLUSH 100 UNIT/ML IV SOLN
500.0000 [IU] | Freq: Once | INTRAVENOUS | Status: AC | PRN
  Administered 2022-03-08: 500 [IU]

## 2022-03-08 NOTE — Patient Instructions (Signed)
Marvin Carr  Discharge Instructions: Thank you for choosing [facility] to provide your oncology and hematology care.  If you have a lab appointment with the [facility], please come in thru the Main Entrance and check in at the main information desk.  Wear comfortable clothing and clothing appropriate for easy access to any Portacath or PICC line.   We strive to give you quality time with your provider. You may need to reschedule your appointment if you arrive late (15 or more minutes).  Arriving late affects you and other patients whose appointments are after yours.  Also, if you miss three or more appointments without notifying the office, you may be dismissed from the clinic at the provider's discretion.      For prescription refill requests, have your pharmacy contact our office and allow 72 hours for refills to be completed.    Today you received the following chemotherapy and/or immunotherapy agents Cisplatin/Gemzar.  Cisplatin injection What is this medication? CISPLATIN (SIS pla tin) is a chemotherapy drug. It targets fast dividing cells, like cancer cells, and causes these cells to die. This medicine is used to treat many types of cancer like bladder, ovarian, and testicular cancers. This medicine may be used for other purposes; ask your health care provider or pharmacist if you have questions. COMMON BRAND NAME(S): Platinol, Platinol -AQ What should I tell my care team before I take this medication? They need to know if you have any of these conditions: eye disease, vision problems hearing problems kidney disease low blood counts, like white cells, platelets, or red blood cells tingling of the fingers or toes, or other nerve disorder an unusual or allergic reaction to cisplatin, carboplatin, oxaliplatin, other medicines, foods, dyes, or preservatives pregnant or trying to get pregnant breast-feeding How should I use this medication? This drug is  given as an infusion into a vein. It is administered in a hospital or clinic by a specially trained health care professional. Talk to your pediatrician regarding the use of this medicine in children. Special care may be needed. Overdosage: If you think you have taken too much of this medicine contact a poison control center or emergency room at once. NOTE: This medicine is only for you. Do not share this medicine with others. What if I miss a dose? It is important not to miss a dose. Call your doctor or health care professional if you are unable to keep an appointment. What may interact with this medication? This medicine may interact with the following medications: foscarnet certain antibiotics like amikacin, gentamicin, neomycin, polymyxin B, streptomycin, tobramycin, vancomycin This list may not describe all possible interactions. Give your health care provider a list of all the medicines, herbs, non-prescription drugs, or dietary supplements you use. Also tell them if you smoke, drink alcohol, or use illegal drugs. Some items may interact with your medicine. What should I watch for while using this medication? Your condition will be monitored carefully while you are receiving this medicine. You will need important blood work done while you are taking this medicine. This drug may make you feel generally unwell. This is not uncommon, as chemotherapy can affect healthy cells as well as cancer cells. Report any side effects. Continue your course of treatment even though you feel ill unless your doctor tells you to stop. This medicine may increase your risk of getting an infection. Call your healthcare professional for advice if you get a fever, chills, or sore throat, or other  symptoms of a cold or flu. Do not treat yourself. Try to avoid being around people who are sick. Avoid taking medicines that contain aspirin, acetaminophen, ibuprofen, naproxen, or ketoprofen unless instructed by your healthcare  professional. These medicines may hide a fever. This medicine may increase your risk to bruise or bleed. Call your doctor or health care professional if you notice any unusual bleeding. Be careful brushing and flossing your teeth or using a toothpick because you may get an infection or bleed more easily. If you have any dental work done, tell your dentist you are receiving this medicine. Do not become pregnant while taking this medicine or for 14 months after stopping it. Women should inform their healthcare professional if they wish to become pregnant or think they might be pregnant. Men should not father a child while taking this medicine and for 11 months after stopping it. There is potential for serious side effects to an unborn child. Talk to your healthcare professional for more information. Do not breast-feed an infant while taking this medicine. This medicine has caused ovarian failure in some women. This medicine may make it more difficult to get pregnant. Talk to your healthcare professional if you are concerned about your fertility. This medicine has caused decreased sperm counts in some men. This may make it more difficult to father a child. Talk to your healthcare professional if you are concerned about your fertility. Drink fluids as directed while you are taking this medicine. This will help protect your kidneys. Call your doctor or health care professional if you get diarrhea. Do not treat yourself. What side effects may I notice from receiving this medication? Side effects that you should report to your doctor or health care professional as soon as possible: allergic reactions like skin rash, itching or hives, swelling of the face, lips, or tongue blurred vision changes in vision decreased hearing or ringing of the ears nausea, vomiting pain, redness, or irritation at site where injected pain, tingling, numbness in the hands or feet signs and symptoms of bleeding such as bloody or  black, tarry stools; red or dark brown urine; spitting up blood or brown material that looks like coffee grounds; red spots on the skin; unusual bruising or bleeding from the eyes, gums, or nose signs and symptoms of infection like fever; chills; cough; sore throat; pain or trouble passing urine signs and symptoms of kidney injury like trouble passing urine or change in the amount of urine signs and symptoms of low red blood cells or anemia such as unusually weak or tired; feeling faint or lightheaded; falls; breathing problems Side effects that usually do not require medical attention (report to your doctor or health care professional if they continue or are bothersome): loss of appetite mouth sores muscle cramps This list may not describe all possible side effects. Call your doctor for medical advice about side effects. You may report side effects to FDA at 1-800-FDA-1088. Where should I keep my medication? This drug is given in a hospital or clinic and will not be stored at home. NOTE: This sheet is a summary. It may not cover all possible information. If you have questions about this medicine, talk to your doctor, pharmacist, or health care provider.  2023 Elsevier/Gold Standard (2021-07-29 00:00:00)        To help prevent nausea and vomiting after your treatment, we encourage you to take your nausea medication as directed.  BELOW ARE SYMPTOMS THAT SHOULD BE REPORTED IMMEDIATELY: *FEVER GREATER THAN 100.4 F (  38 C) OR HIGHER *CHILLS OR SWEATING *NAUSEA AND VOMITING THAT IS NOT CONTROLLED WITH YOUR NAUSEA MEDICATION *UNUSUAL SHORTNESS OF BREATH *UNUSUAL BRUISING OR BLEEDING *URINARY PROBLEMS (pain or burning when urinating, or frequent urination) *BOWEL PROBLEMS (unusual diarrhea, constipation, pain near the anus) TENDERNESS IN MOUTH AND THROAT WITH OR WITHOUT PRESENCE OF ULCERS (sore throat, sores in mouth, or a toothache) UNUSUAL RASH, SWELLING OR PAIN  UNUSUAL VAGINAL DISCHARGE OR  ITCHING   Items with * indicate a potential emergency and should be followed up as soon as possible or go to the Emergency Department if any problems should occur.  Please show the CHEMOTHERAPY ALERT CARD or IMMUNOTHERAPY ALERT CARD at check-in to the Emergency Department and triage nurse.  Should you have questions after your visit or need to cancel or reschedule your appointment, please contact [facility] [phone]  and follow the prompts.  Office hours are 8:00 a.m. to 4:30 p.m. Monday - Friday. Please note that voicemails left after 4:00 p.m. may not be returned until the following business day.  We are closed weekends and major holidays. You have access to a nurse at all times for urgent questions. Please call the main number to the clinic [phone] and follow the prompts.  For any non-urgent questions, you may also contact your provider using MyChart. We now offer e-Visits for anyone 47 and older to request care online for non-urgent symptoms. For details visit [email or web address].   Also download the MyChart app! Go to the app store, search "MyChart", open the app, select [facility], and log in with your MyChart username and password.   Masks are optional in the cancer centers. If you would like for your care team to wear a mask while they are taking care of you, please let them know. For doctor visits, patients may have with them one support person who is at least 67 years old. At this time, visitors are not allowed in the infusion area.

## 2022-03-08 NOTE — Progress Notes (Signed)
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  PD-L1 (SX115) negative.  MSI-stable.  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, a 66 y.o. male, returns for routine follow-up and consideration for next cycle of chemotherapy. Hommer was last seen on 02/15/2022.  Due for cycle #8 of Cisplatin + Gemcitabine + Imfinzi today.   Overall, he tells me he has been feeling pretty well. He reports SOB with mild exertion, and he reports dry cough which has worsened over the past 2 weeks. He also reports fatigue. The numbness in his toes at night is stable.   Overall, he will not receive his next cycle of chemo today.    REVIEW OF SYSTEMS:  Review of Systems  Constitutional:  Positive for fatigue. Negative for appetite change.  Respiratory:  Positive for cough and shortness of breath.   Cardiovascular:  Positive for chest pain.  Neurological:  Positive for headaches and numbness (stable).  Psychiatric/Behavioral:  Positive for sleep disturbance.   All other systems reviewed and are negative.   PAST MEDICAL/SURGICAL  HISTORY:  Past Medical History:  Diagnosis Date   Anxiety    Bladder cancer ([place]) 09/11/2009   CAD (coronary artery disease), native coronary artery    a. Mildly elevated troponin 03/2013, cath with nonobstructive disease including 50% AV groove distal stenosis before large OM   Essential hypertension    Headache(784.0)    History of migraines   Hyperglycemia    Mixed hyperlipidemia    Neuropathy    Obesity    Port-A-Cath in place 09/30/2021   Pre-diabetes    Seasonal allergies    Sleep apnea    On CPAP   Past Surgical History:  Procedure Laterality Date   BIOPSY  09/23/2021   Procedure: BIOPSY;  Surgeon: Harvel Quale, MD;  [facility];  Service: Gastroenterology;;   COLONOSCOPY  06/28/2011   Procedure: COLONOSCOPY;  Surgeon: Rogene Houston, MD;  [facility];  Service: Endoscopy;  Laterality: N/A;   COLONOSCOPY WITH PROPOFOL N/A 09/23/2021   Procedure: COLONOSCOPY WITH PROPOFOL;  Surgeon: Harvel Quale, MD;  [facility];  Service: Gastroenterology;  Laterality: N/A;  940   ESOPHAGOGASTRODUODENOSCOPY (EGD) WITH PROPOFOL N/A 09/23/2021   Procedure: ESOPHAGOGASTRODUODENOSCOPY (EGD) WITH PROPOFOL;  Surgeon: Harvel Quale, MD;  [facility];  Service: Gastroenterology;  Laterality: N/A;   IR IMAGING GUIDED PORT INSERTION  09/28/2021   IR PARACENTESIS  09/28/2021   JOINT REPLACEMENT Right    hip   LEFT HEART CATHETERIZATION WITH CORONARY ANGIOGRAM N/A 03/31/2013   Procedure: LEFT HEART CATHETERIZATION WITH CORONARY ANGIOGRAM;  Surgeon: Minus Breeding, MD;  Location: [facility] CATH LAB;  Service: Cardiovascular;  Laterality: N/A;   POLYPECTOMY  09/23/2021   Procedure: POLYPECTOMY;  Surgeon: Harvel Quale, MD;  [facility];  Service: Gastroenterology;;   TURBT  09/11/2009    SOCIAL HISTORY:  Social History   Socioeconomic History   Marital status: Divorced    Spouse name: Not on file    Number of children: 0   Years of education: college   Highest education level: Not on file  Occupational History    Employer: SELF-EMPLOYED  Tobacco Use   Smoking status: Former    Packs/day: 0.50    Years: 10.00    Total pack years: 5.00    Types: Cigarettes    Quit date: 07/10/2011    Years since quitting: 10.6   Smokeless tobacco: Never   Tobacco comments:    Quit several yrs prior to 03/2013.  Vaping Use   Vaping Use: Never used  Substance and Sexual Activity   Alcohol use: Yes    Alcohol/week: 0.0 standard drinks of alcohol    Comment: Occasional   Drug use: No   Sexual activity: Not on file  Other Topics Concern   Not on file  Social History Narrative   Not on file   Social Determinants of Health   Financial Resource Strain: Not on file  Food Insecurity: Not on file  Transportation Needs: Not on file  Physical Activity: Not on file  Stress: Not on file  Social Connections: Not on file  Intimate Partner Violence: Not on file    FAMILY HISTORY:  Family History  Problem Relation Age of Onset   COPD Father     CURRENT MEDICATIONS:  Current Outpatient Medications  Medication Sig Dispense Refill   amLODipine (NORVASC) 5 MG tablet Take 5 mg by mouth daily.     aspirin EC 81 MG tablet Take 81 mg by mouth daily.     buPROPion (WELLBUTRIN XL) 300 MG 24 hr tablet Take 300 mg by mouth daily.     CISPLATIN IV Inject into the vein once a week. Day 1, Day 8 q 21 days     cyclobenzaprine (FLEXERIL) 10 MG tablet Take 10 mg by mouth at bedtime as needed for muscle spasms.     Durvalumab (IMFINZI IV) Inject into the vein every 21 ( twenty-one) days.     Eszopiclone 3 MG TABS Take 3 mg by mouth at bedtime. Take immediately before bedtime     famotidine (PEPCID) 20 MG tablet TAKE (1) TABLET BY MOUTH AT BEDTIME. 30 tablet 0   fenofibrate 160 MG tablet Take 160 mg by mouth daily.     FLUoxetine (PROZAC) 10 MG capsule Take 10 mg by mouth daily.     FLUoxetine (PROZAC) 10 MG  tablet Take 10 mg by mouth daily.     gabapentin (NEURONTIN) 300 MG capsule Take 300 mg by mouth 3 (three) times daily.     Gemcitabine HCl (GEMZAR IV) Inject into the vein once a week. Day 1, Day 8 q 21 days     HYDROcodone-acetaminophen (NORCO/VICODIN) 5-325 MG tablet Take 1 tablet by mouth every 4 (four) hours as needed for moderate pain. 30 tablet 0   Lactulose 20 GM/30ML SOLN Take 30 mLs (20 g total) by mouth daily. 450 mL 6   magnesium oxide (MAG-OX) 400 (240 Mg) MG tablet Take 1 tablet (400 mg total) by mouth 2 (two) times daily. 90 tablet 6   metFORMIN (  GLUCOPHAGE-XR) 500 MG 24 hr tablet Take 500 mg by mouth 2 (two) times daily.     methylPREDNISolone (MEDROL DOSEPAK) 4 MG TBPK tablet Take as directed. 21 each 0   olmesartan (BENICAR) 40 MG tablet Take 1 tablet (40 mg total) by mouth daily. 30 tablet 6   omeprazole (PRILOSEC) 40 MG capsule Take 40 mg by mouth daily.     pantoprazole (PROTONIX) 40 MG tablet Take 1 tablet (40 mg total) by mouth daily. 90 tablet 3   pramipexole (MIRAPEX) 0.75 MG tablet Take 1 tablet (0.75 mg total) by mouth 3 (three) times daily. 90 tablet 4   RYBELSUS 7 MG TABS Take 7 mg by mouth daily.     tamsulosin (FLOMAX) 0.4 MG CAPS capsule Take 0.4 mg by mouth daily.     traMADol (ULTRAM) 50 MG tablet Take 50 mg by mouth 4 (four) times daily as needed.     traZODone (DESYREL) 100 MG tablet Take 1 tablet (100 mg total) by mouth at bedtime. 30 tablet 0   triamcinolone cream (KENALOG) 0.1 % SMARTSIG:1 Application Topical 2-3 Times Daily     zaleplon (SONATA) 10 MG capsule Take 1 capsule (10 mg total) by mouth at bedtime as needed for sleep. 30 capsule 0   lidocaine-prilocaine (EMLA) cream Apply a small amount to port a cath site and cover with plastic wrap 1 hour prior to infusion appointments (Patient not taking: Reported on 03/08/2022) 30 g 3   nitroGLYCERIN (NITROSTAT) 0.4 MG SL tablet Place 1 tablet (0.4 mg total) under the tongue every 5 (five) minutes as needed for  chest pain. (Patient not taking: Reported on 03/08/2022) 25 tablet 3   prochlorperazine (COMPAZINE) 10 MG tablet Take 1 tablet (10 mg total) by mouth every 6 (six) hours as needed (Nausea or vomiting). (Patient not taking: Reported on 03/08/2022) 30 tablet 1   No current facility-administered medications for this visit.    ALLERGIES:  No Known Allergies  PHYSICAL EXAM:  Performance status (ECOG): 0 - Asymptomatic  There were no vitals filed for this visit. Wt Readings from Last 3 Encounters:  03/08/22 266 lb 11.2 oz (121 kg)  02/28/22 261 lb (118.4 kg)  02/22/22 262 lb 1.6 oz (118.9 kg)   Physical Exam Vitals reviewed.  Constitutional:      Appearance: Normal appearance. He is obese.  Cardiovascular:     Rate and Rhythm: Normal rate and regular rhythm.     Pulses: Normal pulses.     Heart sounds: Normal heart sounds.  Pulmonary:     Effort: Pulmonary effort is normal.     Breath sounds: Normal breath sounds.  Neurological:     General: No focal deficit present.     Mental Status: He is alert and oriented to person, place, and time.  Psychiatric:        Mood and Affect: Mood normal.        Behavior: Behavior normal.     LABORATORY DATA:  I have reviewed the labs as listed.     Latest Ref Rng & Units 03/08/2022    7:44 AM 02/28/2022    2:42 PM 02/22/2022    7:52 AM  CBC  WBC 4.0 - 10.5 K/uL 8.5  18.8  2.6   Hemoglobin 13.0 - 17.0 g/dL 10.7  10.6  10.3   Hematocrit 39.0 - 52.0 % 31.1  31.2  30.1   Platelets 150 - 400 K/uL 168  96  172       Latest  Ref Rng & Units 03/08/2022    7:44 AM 02/28/2022    2:42 PM 02/22/2022    7:52 AM  CMP  Glucose 70 - 99 mg/dL 117  156  134   BUN 8 - 23 mg/dL 41  27  31   Creatinine 0.61 - 1.24 mg/dL 1.15  1.18  1.05   Sodium 135 - 145 mmol/L 135  136  136   Potassium 3.5 - 5.1 mmol/L 4.0  4.1  4.1   Chloride 98 - 111 mmol/L 103  102  105   CO2 22 - 32 mmol/L _0 Calcium 8.9 - 10.3 mg/dL 8.9  9.4  9.2   Total Protein 6.5 -  8.1 g/dL 6.4  6.4  6.2   Total Bilirubin 0.3 - 1.2 mg/dL 0.4  0.2  0.2   Alkaline Phos 38 - 126 U/L 129  145  81   AST 15 - 41 U/L _1 ALT 0 - 44 U/L _2 DIAGNOSTIC IMAGING:  I have independently reviewed the scans and discussed with the patient. CT CHEST ABDOMEN PELVIS W CONTRAST  Result Date: 03/04/2022 CLINICAL DATA:  Cholangiocarcinoma. Abdominal pain and swelling. Concern for immunotherapy induced pneumonitis. EXAM: CT CHEST, ABDOMEN, AND PELVIS WITH CONTRAST TECHNIQUE: Multidetector CT imaging of the chest, abdomen and pelvis was performed following the standard protocol during bolus administration of intravenous contrast. RADIATION DOSE REDUCTION: This exam was performed according to the departmental dose-optimization program which includes automated exposure control, adjustment of the mA and/or kV according to patient size and/or use of iterative reconstruction technique. CONTRAST:  169m OMNIPAQUE IOHEXOL 300 MG/ML  SOLN COMPARISON:  CT chest abdomen and pelvis 12/20/2021 FINDINGS: CT CHEST FINDINGS Cardiovascular: No significant vascular findings. Normal heart size. No pericardial effusion. Right chest port catheter tip ends in the SVC. There are atherosclerotic calcifications of the aorta. Mediastinum/Nodes: No enlarged mediastinal, hilar, or axillary lymph nodes. Thyroid gland, trachea, and esophagus demonstrate no significant findings. Lungs/Pleura: There are minimal peripheral reticular opacities and minimal tree-in-bud opacities in the left upper lobe anteriorly. Lungs are otherwise clear. No pleural effusion or pneumothorax. No discrete pulmonary nodules. Musculoskeletal: No chest wall mass or suspicious bone lesions identified. CT ABDOMEN PELVIS FINDINGS Hepatobiliary: Cholelithiasis and irregular gallbladder wall thickening is again seen, unchanged from the prior examination. Hepatic lesion in the left lobe/direct extension measures 2.0 x 2.2 by 1.6 cm and has  decreased in size when compared to the prior study. No new hepatic lesions are identified. No biliary ductal dilatation. Pancreas: Unremarkable. No pancreatic ductal dilatation or surrounding inflammatory changes. Spleen: Normal in size without focal abnormality. Adrenals/Urinary Tract: Adrenal glands are unremarkable. Kidneys are normal, without renal calculi, focal lesion, or hydronephrosis. Bladder is unremarkable. Stomach/Bowel: Stomach is within normal limits. Appendix appears normal. No evidence of bowel wall thickening, distention, or inflammatory changes. There is diffuse colonic diverticulosis. Vascular/Lymphatic: Aortic atherosclerosis. No enlarged abdominal or pelvic lymph nodes. Reproductive: Prostate is unremarkable. Other: Small fat containing umbilical and inguinal hernias are again seen. There is no ascites. Musculoskeletal: Right hip arthroplasty again noted. No acute fracture or malalignment. No focal osseous lesion. Degenerative changes affect the spine. Left gluteal intramuscular lipoma is unchanged. IMPRESSION: 1. Minimal new peripheral reticular and tree-in-bud opacities in the left upper lobe, likely infectious/inflammatory. 2. Gallbladder wall thickening and cholelithiasis compatible with patient's known carcinoma appears stable. 3. Direct extension/left liver lesion has decreased  in size. No new liver lesions. Electronically Signed   By: Ronney Asters M.D.   On: 03/04/2022 15:53   DG Chest 2 View  Result Date: 03/01/2022 CLINICAL DATA:  Shortness of breath.  Left-sided chest pain. EXAM: CHEST - 2 VIEW COMPARISON:  Chest radiograph 04/30/2021 FINDINGS: Right anterior chest wall Port-A-Cath is present with tip projecting over the superior vena cava. Stable cardiac and mediastinal contours. Heterogeneous opacities left lung base. No pleural effusion or pneumothorax. Thoracic spine degenerative changes. IMPRESSION: Heterogeneous left lung base opacities favored to represent atelectasis.  Infection not excluded. Electronically Signed   By: Lovey Newcomer M.D.   On: 03/01/2022 08:03     ASSESSMENT:  Liver lesion/peritoneal carcinomatosis: - Patient seen at the request of Dr. Delphina Cahill - Reported pressure on the sides of the abdomen with slight pain for the last 1 month.  He also reported pain in the epigastric region. - He had lost 20 pounds in the last 3 to 4 months intentionally, cutting back on sugars.  He was also started on semaglutide. - Colonoscopy on 06/28/2011 with a benign polypoid colonic mucosa in the descending colon.  No evidence of malignancy. - MRI of the brain was negative. - EGD and colonoscopy on 09/23/2018 did not reveal any malignancies. 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  IHC positive for CK7, CDX2 and negative for GATA3.  Findings suggestive of upper GI or pancreaticobiliary primary. - Cycle 1 of gemcitabine, cisplatin and durvalumab started on 10/05/2021. - NGS: No targetable mutations.  PD-L1 (NK539) negative.  MSI-stable.  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   Social/family history: - He lives at home by himself.  He records voiceover/narrations. - Non-smoker. - Father died of MDS.  Paternal grandfather had prostate cancer.  Maternal grandfather had cancer.  3.  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 Alinda Money.  Reportedly received 1 treatment of intravesical chemo and has been on surveillance since then.  Last surveillance visit was in 2020.   PLAN:  Cholangiocarcinoma with peritoneal carcinomatosis: - After last visit, he developed dyspnea on exertion for the last 2 weeks.  He also developed dry cough. - He has some numbness/tingling in the toes. - I have reviewed CT CAP (03/04/2022): Minimal new peripheral reticular and tree-in-bud opacities in the left upper lobe, inflammatory/infectious.  Left liver lesion has decreased in size.  No new liver lesions. - It is  possible that he is developing early pneumonitis.  I will hold his Imfinzi today. - We will give him prednisone taper in the form of Medrol Dosepak. - He also reports feeling fatigued after each treatment.  As the CT scan shows good control of disease, I will change his treatment to day 1 and day 15 every 28 days. - I have reviewed his labs today which showed alk phos elevated with other LFTs normal.  Creatinine normal.  Proceed with the next cycle today.  RTC 4 weeks for follow-up.   Hypertension: - Continue Benicar 40 mg daily and amlodipine 5 mg daily.  Blood pressure is 127/62.  3.  Neuropathy/"drawing" of legs: - Continue Mirapex 0.75 mg 3 times daily.  4.  Sleeping difficulty: - Continue Lunesta as needed.  5.  Hypomagnesemia: - Continue magnesium 3 times daily.  Magnesium normal.   Orders placed this encounter:  No orders of the defined types were placed in this encounter.    Derek Jack, MD [facility] [phone]   I,  Thana Ates, am acting as a Education administrator for Dr. Derek Jack.  I, Derek Jack MD, have reviewed the above documentation for accuracy and completeness, and I agree with the above.

## 2022-03-08 NOTE — Progress Notes (Signed)
Patient presents today for treatment and follow up visit with Dr. Delton Coombes. Labs within parameters for today's treatment. MAR reviewed. Patient has complaints of shortness of breath with a cough that's productive. Vital signs within parameters for treatment.   Message received from A. Ouida Sills RN / Dr. Delton Coombes proceed with treatment. HOLD Imfinzi today due to new onset of cough and shortness of breath. Medrol dose pack sent per A. Beckie Salts . Treatment to change to every other week per secure chat message from A. Beckie Salts / Dr. Delton Coombes.   Voided 220 mls.   Treatment given today per MD orders. Tolerated infusion without adverse affects. Vital signs stable. No complaints at this time. Discharged from clinic ambulatory in stable condition. Alert and oriented x 3. F/U with [facility] as scheduled.

## 2022-03-08 NOTE — Patient Instructions (Addendum)
[place] at [facility] Discharge Instructions   You were seen and examined today by Dr. Delton Coombes.  He reviewed the results of your lab work which is normal/stable.   Your new onset of shortness of breath is concerning, and is possibly related to the Imfinzi causing inflammation in your lungs. We sent a Medrol dose pack to help with this. Take as directed.   We will change your treatment to every other week. This should help with the fatigue.   We will proceed with your treatment today without giving the [place].   Return as scheduled.      Thank you for choosing [place] at [facility] to provide your oncology and hematology care.  To afford each patient quality time with our provider, please arrive at least 15 minutes before your scheduled appointment time.   If you have a lab appointment with the [facility] please come in thru the Main Entrance and check in at the main information desk.  You need to re-schedule your appointment should you arrive 10 or more minutes late.  We strive to give you quality time with our providers, and arriving late affects you and other patients whose appointments are after yours.  Also, if you no show three or more times for appointments you may be dismissed from the clinic at the providers discretion.     Again, thank you for choosing [facility].  Our hope is that these requests will decrease the amount of time that you wait before being seen by our physicians.       _____________________________________________________________  Should you have questions after your visit to [facility], please contact our office at [phone] and follow the prompts.  Our office hours are 8:00 a.m. and 4:30 p.m. Monday - Friday.  Please note that voicemails left after 4:00 p.m. may not be returned until the following business day.  We are closed weekends and major holidays.  You do have  access to a nurse 24-7, just call the main number to the clinic [phone] and do not press any options, hold on the line and a nurse will answer the phone.    For prescription refill requests, have your pharmacy contact our office and allow 72 hours.    Due to Covid, you will need to wear a mask upon entering the hospital. If you do not have a mask, a mask will be given to you at the Main Entrance upon arrival. For doctor visits, patients may have 1 support person age 64 or older with them. For treatment visits, patients can not have anyone with them due to social distancing guidelines and our immunocompromised population.

## 2022-03-10 LAB — CEA: CEA: 2.6 ng/mL (ref 0.0–4.7)

## 2022-03-14 ENCOUNTER — Encounter (HOSPITAL_COMMUNITY): Payer: Self-pay

## 2022-03-15 ENCOUNTER — Inpatient Hospital Stay (HOSPITAL_COMMUNITY): Payer: Medicare Other

## 2022-03-15 ENCOUNTER — Other Ambulatory Visit (HOSPITAL_COMMUNITY): Payer: Self-pay | Admitting: *Deleted

## 2022-03-15 MED ORDER — SUCRALFATE 1 GM/10ML PO SUSP: 1.0000 g | Freq: Four times a day (QID) | ORAL | 0 refills | Status: DC

## 2022-03-15 NOTE — Progress Notes (Signed)
Patient reports ulcers in mouth and back of throat.  Script sent for Carafate 1:1 mix with lidocaine 2% to swish and swallow QID.

## 2022-03-16 ENCOUNTER — Encounter (HOSPITAL_COMMUNITY): Payer: Self-pay | Admitting: Hematology

## 2022-03-17 ENCOUNTER — Inpatient Hospital Stay (HOSPITAL_COMMUNITY): Payer: Medicare Other

## 2022-03-21 MED FILL — Fosaprepitant Dimeglumine For IV Infusion 150 MG (Base Eq): INTRAVENOUS | Qty: 5 | Status: AC

## 2022-03-21 MED FILL — Dexamethasone Sodium Phosphate Inj 100 MG/10ML: INTRAMUSCULAR | Qty: 1 | Status: AC

## 2022-03-22 ENCOUNTER — Inpatient Hospital Stay (HOSPITAL_COMMUNITY): Payer: Medicare Other

## 2022-03-22 ENCOUNTER — Inpatient Hospital Stay (HOSPITAL_COMMUNITY): Payer: Medicare Other | Attending: Hematology

## 2022-03-22 ENCOUNTER — Other Ambulatory Visit (HOSPITAL_COMMUNITY): Payer: Medicare Other

## 2022-03-22 VITALS — BP 134/70 | HR 75 | Temp 97.9°F | Resp 18

## 2022-03-22 DIAGNOSIS — Z808 Family history of malignant neoplasm of other organs or systems: Secondary | ICD-10-CM | POA: Insufficient documentation

## 2022-03-22 DIAGNOSIS — Z87891 Personal history of nicotine dependence: Secondary | ICD-10-CM | POA: Diagnosis not present

## 2022-03-22 DIAGNOSIS — C787 Secondary malignant neoplasm of liver and intrahepatic bile duct: Secondary | ICD-10-CM

## 2022-03-22 DIAGNOSIS — C786 Secondary malignant neoplasm of retroperitoneum and peritoneum: Secondary | ICD-10-CM | POA: Diagnosis not present

## 2022-03-22 DIAGNOSIS — Z8042 Family history of malignant neoplasm of prostate: Secondary | ICD-10-CM | POA: Insufficient documentation

## 2022-03-22 DIAGNOSIS — G629 Polyneuropathy, unspecified: Secondary | ICD-10-CM | POA: Insufficient documentation

## 2022-03-22 DIAGNOSIS — Z8551 Personal history of malignant neoplasm of bladder: Secondary | ICD-10-CM | POA: Insufficient documentation

## 2022-03-22 DIAGNOSIS — Z5111 Encounter for antineoplastic chemotherapy: Secondary | ICD-10-CM | POA: Insufficient documentation

## 2022-03-22 DIAGNOSIS — R059 Cough, unspecified: Secondary | ICD-10-CM | POA: Insufficient documentation

## 2022-03-22 DIAGNOSIS — Z95828 Presence of other vascular implants and grafts: Secondary | ICD-10-CM

## 2022-03-22 DIAGNOSIS — Z79899 Other long term (current) drug therapy: Secondary | ICD-10-CM | POA: Insufficient documentation

## 2022-03-22 DIAGNOSIS — D72819 Decreased white blood cell count, unspecified: Secondary | ICD-10-CM | POA: Insufficient documentation

## 2022-03-22 DIAGNOSIS — C221 Intrahepatic bile duct carcinoma: Secondary | ICD-10-CM | POA: Diagnosis present

## 2022-03-22 DIAGNOSIS — H9313 Tinnitus, bilateral: Secondary | ICD-10-CM | POA: Diagnosis not present

## 2022-03-22 DIAGNOSIS — Z809 Family history of malignant neoplasm, unspecified: Secondary | ICD-10-CM | POA: Diagnosis not present

## 2022-03-22 DIAGNOSIS — G4700 Insomnia, unspecified: Secondary | ICD-10-CM | POA: Diagnosis not present

## 2022-03-22 DIAGNOSIS — I10 Essential (primary) hypertension: Secondary | ICD-10-CM | POA: Insufficient documentation

## 2022-03-22 DIAGNOSIS — D649 Anemia, unspecified: Secondary | ICD-10-CM | POA: Diagnosis not present

## 2022-03-22 LAB — COMPREHENSIVE METABOLIC PANEL
ALT: 23 U/L (ref 0–44)
AST: 22 U/L (ref 15–41)
Albumin: 3.7 g/dL (ref 3.5–5.0)
Alkaline Phosphatase: 77 U/L (ref 38–126)
Anion gap: 6 (ref 5–15)
BUN: 29 mg/dL — ABNORMAL HIGH (ref 8–23)
CO2: 26 mmol/L (ref 22–32)
Calcium: 9.1 mg/dL (ref 8.9–10.3)
Chloride: 106 mmol/L (ref 98–111)
Creatinine, Ser: 1.14 mg/dL (ref 0.61–1.24)
GFR, Estimated: >60 mL/min (ref >60)
Glucose, Bld: 124 mg/dL — ABNORMAL HIGH (ref 70–99)
Potassium: 4.3 mmol/L (ref 3.5–5.1)
Sodium: 138 mmol/L (ref 135–145)
Total Bilirubin: 0.5 mg/dL (ref 0.3–1.2)
Total Protein: 6.2 g/dL — ABNORMAL LOW (ref 6.5–8.1)

## 2022-03-22 LAB — CBC WITH DIFFERENTIAL/PLATELET
Abs Immature Granulocytes: 0.01 10*3/uL (ref 0.00–0.07)
Basophils Absolute: 0 10*3/uL (ref 0.0–0.1)
Basophils Relative: 1 %
Eosinophils Absolute: 0.1 10*3/uL (ref 0.0–0.5)
Eosinophils Relative: 3 %
HCT: 31.1 % — ABNORMAL LOW (ref 39.0–52.0)
Hemoglobin: 10.8 g/dL — ABNORMAL LOW (ref 13.0–17.0)
Immature Granulocytes: 0 %
Lymphocytes Relative: 29 %
Lymphs Abs: 1.2 10*3/uL (ref 0.7–4.0)
MCH: 35.6 pg — ABNORMAL HIGH (ref 26.0–34.0)
MCHC: 34.7 g/dL (ref 30.0–36.0)
MCV: 102.6 fL — ABNORMAL HIGH (ref 80.0–100.0)
Monocytes Absolute: 0.8 10*3/uL (ref 0.1–1.0)
Monocytes Relative: 21 %
Neutro Abs: 1.9 10*3/uL (ref 1.7–7.7)
Neutrophils Relative %: 46 %
Platelets: 174 10*3/uL (ref 150–400)
RBC: 3.03 MIL/uL — ABNORMAL LOW (ref 4.22–5.81)
RDW: 13 % (ref 11.5–15.5)
WBC: 4.1 10*3/uL (ref 4.0–10.5)
nRBC: 0 % (ref 0.0–0.2)

## 2022-03-22 LAB — MAGNESIUM: Magnesium: 1.8 mg/dL (ref 1.7–2.4)

## 2022-03-22 MED ORDER — HEPARIN SOD (PORK) LOCK FLUSH 100 UNIT/ML IV SOLN
500.0000 [IU] | Freq: Once | INTRAVENOUS | Status: AC | PRN
  Administered 2022-03-22: 500 [IU]

## 2022-03-22 MED ORDER — SODIUM CHLORIDE 0.9 % IV SOLN
25.0000 mg/m2 | Freq: Once | INTRAVENOUS | Status: AC
  Administered 2022-03-22: 62 mg via INTRAVENOUS
  Filled 2022-03-22: qty 62

## 2022-03-22 MED ORDER — SODIUM CHLORIDE 0.9 % IV SOLN
150.0000 mg | Freq: Once | INTRAVENOUS | Status: AC
  Administered 2022-03-22: 150 mg via INTRAVENOUS
  Filled 2022-03-22: qty 150

## 2022-03-22 MED ORDER — SODIUM CHLORIDE 0.9% FLUSH
10.0000 mL | INTRAVENOUS | Status: DC | PRN
  Administered 2022-03-22: 10 mL

## 2022-03-22 MED ORDER — SODIUM CHLORIDE 0.9 % IV SOLN
1000.0000 mg/m2 | Freq: Once | INTRAVENOUS | Status: AC
  Administered 2022-03-22: 2470 mg via INTRAVENOUS
  Filled 2022-03-22: qty 52.6

## 2022-03-22 MED ORDER — POTASSIUM CHLORIDE IN NACL 20-0.9 MEQ/L-% IV SOLN
Freq: Once | INTRAVENOUS | Status: AC
  Filled 2022-03-22: qty 1000

## 2022-03-22 MED ORDER — PALONOSETRON HCL INJECTION 0.25 MG/5ML
0.2500 mg | Freq: Once | INTRAVENOUS | Status: AC
  Administered 2022-03-22: 0.25 mg via INTRAVENOUS
  Filled 2022-03-22: qty 5

## 2022-03-22 MED ORDER — SODIUM CHLORIDE 0.9 % IV SOLN: Freq: Once | INTRAVENOUS | Status: AC

## 2022-03-22 MED ORDER — MAGNESIUM SULFATE 2 GM/50ML IV SOLN
2.0000 g | Freq: Once | INTRAVENOUS | Status: AC
  Administered 2022-03-22: 2 g via INTRAVENOUS
  Filled 2022-03-22: qty 50

## 2022-03-22 MED ORDER — SODIUM CHLORIDE 0.9 % IV SOLN
10.0000 mg | Freq: Once | INTRAVENOUS | Status: AC
  Administered 2022-03-22: 10 mg via INTRAVENOUS
  Filled 2022-03-22: qty 10

## 2022-03-22 NOTE — Progress Notes (Signed)
Patient presents today for chemotherapy infusion.  Patient is in satisfactory condition with no new complaints voiced.  Vital signs are stable.  Labs reviewed and all labs are within treatment parameters. We will proceed with treatment per MD orders.   Patient tolerated treatment well with no complaints voiced.  Patient left ambulatory in stable condition.  Vital signs stable at discharge.  Follow up as scheduled.

## 2022-03-22 NOTE — Patient Instructions (Signed)
[facility]  Discharge Instructions: Thank you for choosing [facility] to provide your oncology and hematology care.  If you have a lab appointment with the [facility], please come in thru the Main Entrance and check in at the main information desk.  Wear comfortable clothing and clothing appropriate for easy access to any Portacath or PICC line.   We strive to give you quality time with your provider. You may need to reschedule your appointment if you arrive late (15 or more minutes).  Arriving late affects you and other patients whose appointments are after yours.  Also, if you miss three or more appointments without notifying the office, you may be dismissed from the clinic at the provider's discretion.      For prescription refill requests, have your pharmacy contact our office and allow 72 hours for refills to be completed.    Today you received the following chemotherapy and/or immunotherapy agents Cisplatin/Gemzar.   Gemcitabine injection What is this medication? GEMCITABINE (jem SYE ta been) is a chemotherapy drug. This medicine is used to treat many types of cancer like breast cancer, lung cancer, pancreatic cancer, and ovarian cancer. This medicine may be used for other purposes; ask your health care provider or pharmacist if you have questions. COMMON BRAND NAME(S): Gemzar, Infugem What should I tell my care team before I take this medication? They need to know if you have any of these conditions: blood disorders infection kidney disease liver disease lung or breathing disease, like asthma recent or ongoing radiation therapy an unusual or allergic reaction to gemcitabine, other chemotherapy, other medicines, foods, dyes, or preservatives pregnant or trying to get pregnant breast-feeding How should I use this medication? This drug is given as an infusion into a vein. It is administered in a hospital or clinic by a specially trained health care  professional. Talk to your pediatrician regarding the use of this medicine in children. Special care may be needed. Overdosage: If you think you have taken too much of this medicine contact a poison control center or emergency room at once. NOTE: This medicine is only for you. Do not share this medicine with others. What if I miss a dose? It is important not to miss your dose. Call your doctor or health care professional if you are unable to keep an appointment. What may interact with this medication? medicines to increase blood counts like filgrastim, pegfilgrastim, sargramostim some other chemotherapy drugs like cisplatin vaccines Talk to your doctor or health care professional before taking any of these medicines: acetaminophen aspirin ibuprofen ketoprofen naproxen This list may not describe all possible interactions. Give your health care provider a list of all the medicines, herbs, non-prescription drugs, or dietary supplements you use. Also tell them if you smoke, drink alcohol, or use illegal drugs. Some items may interact with your medicine. What should I watch for while using this medication? Visit your doctor for checks on your progress. This drug may make you feel generally unwell. This is not uncommon, as chemotherapy can affect healthy cells as well as cancer cells. Report any side effects. Continue your course of treatment even though you feel ill unless your doctor tells you to stop. In some cases, you may be given additional medicines to help with side effects. Follow all directions for their use. Call your doctor or health care professional for advice if you get a fever, chills or sore throat, or other symptoms of a cold or flu. Do not treat  yourself. This drug decreases your body's ability to fight infections. Try to avoid being around people who are sick. This medicine may increase your risk to bruise or bleed. Call your doctor or health care professional if you notice any  unusual bleeding. Be careful brushing and flossing your teeth or using a toothpick because you may get an infection or bleed more easily. If you have any dental work done, tell your dentist you are receiving this medicine. Avoid taking products that contain aspirin, acetaminophen, ibuprofen, naproxen, or ketoprofen unless instructed by your doctor. These medicines may hide a fever. Do not become pregnant while taking this medicine or for 6 months after stopping it. Women should inform their doctor if they wish to become pregnant or think they might be pregnant. Men should not father a child while taking this medicine and for 3 months after stopping it. There is a potential for serious side effects to an unborn child. Talk to your health care professional or pharmacist for more information. Do not breast-feed an infant while taking this medicine or for at least 1 week after stopping it. Men should inform their doctors if they wish to father a child. This medicine may lower sperm counts. Talk with your doctor or health care professional if you are concerned about your fertility. What side effects may I notice from receiving this medication? Side effects that you should report to your doctor or health care professional as soon as possible: allergic reactions like skin rash, itching or hives, swelling of the face, lips, or tongue breathing problems pain, redness, or irritation at site where injected signs and symptoms of a dangerous change in heartbeat or heart rhythm like chest pain; dizziness; fast or irregular heartbeat; palpitations; feeling faint or lightheaded, falls; breathing problems signs of decreased platelets or bleeding - bruising, pinpoint red spots on the skin, black, tarry stools, blood in the urine signs of decreased red blood cells - unusually weak or tired, feeling faint or lightheaded, falls signs of infection - fever or chills, cough, sore throat, pain or difficulty passing urine signs  and symptoms of kidney injury like trouble passing urine or change in the amount of urine signs and symptoms of liver injury like dark yellow or Jamarques Pinedo urine; general ill feeling or flu-like symptoms; light-colored stools; loss of appetite; nausea; right upper belly pain; unusually weak or tired; yellowing of the eyes or skin swelling of ankles, feet, hands Side effects that usually do not require medical attention (report to your doctor or health care professional if they continue or are bothersome): constipation diarrhea hair loss loss of appetite nausea rash vomiting This list may not describe all possible side effects. Call your doctor for medical advice about side effects. You may report side effects to FDA at 1-800-FDA-1088. Where should I keep my medication? This drug is given in a hospital or clinic and will not be stored at home. NOTE: This sheet is a summary. It may not cover all possible information. If you have questions about this medicine, talk to your doctor, pharmacist, or health care provider.  2023 Elsevier/Gold Standard (2017-11-21 00:00:00)   Cisplatin injection What is this medication? CISPLATIN (SIS pla tin) is a chemotherapy drug. It targets fast dividing cells, like cancer cells, and causes these cells to die. This medicine is used to treat many types of cancer like bladder, ovarian, and testicular cancers. This medicine may be used for other purposes; ask your health care provider or pharmacist if you have questions. COMMON  BRAND NAME(S): Platinol, Platinol -AQ What should I tell my care team before I take this medication? They need to know if you have any of these conditions: eye disease, vision problems hearing problems kidney disease low blood counts, like white cells, platelets, or red blood cells tingling of the fingers or toes, or other nerve disorder an unusual or allergic reaction to cisplatin, carboplatin, oxaliplatin, other medicines, foods, dyes, or  preservatives pregnant or trying to get pregnant breast-feeding How should I use this medication? This drug is given as an infusion into a vein. It is administered in a hospital or clinic by a specially trained health care professional. Talk to your pediatrician regarding the use of this medicine in children. Special care may be needed. Overdosage: If you think you have taken too much of this medicine contact a poison control center or emergency room at once. NOTE: This medicine is only for you. Do not share this medicine with others. What if I miss a dose? It is important not to miss a dose. Call your doctor or health care professional if you are unable to keep an appointment. What may interact with this medication? This medicine may interact with the following medications: foscarnet certain antibiotics like amikacin, gentamicin, neomycin, polymyxin B, streptomycin, tobramycin, vancomycin This list may not describe all possible interactions. Give your health care provider a list of all the medicines, herbs, non-prescription drugs, or dietary supplements you use. Also tell them if you smoke, drink alcohol, or use illegal drugs. Some items may interact with your medicine. What should I watch for while using this medication? Your condition will be monitored carefully while you are receiving this medicine. You will need important blood work done while you are taking this medicine. This drug may make you feel generally unwell. This is not uncommon, as chemotherapy can affect healthy cells as well as cancer cells. Report any side effects. Continue your course of treatment even though you feel ill unless your doctor tells you to stop. This medicine may increase your risk of getting an infection. Call your healthcare professional for advice if you get a fever, chills, or sore throat, or other symptoms of a cold or flu. Do not treat yourself. Try to avoid being around people who are sick. Avoid taking  medicines that contain aspirin, acetaminophen, ibuprofen, naproxen, or ketoprofen unless instructed by your healthcare professional. These medicines may hide a fever. This medicine may increase your risk to bruise or bleed. Call your doctor or health care professional if you notice any unusual bleeding. Be careful brushing and flossing your teeth or using a toothpick because you may get an infection or bleed more easily. If you have any dental work done, tell your dentist you are receiving this medicine. Do not become pregnant while taking this medicine or for 14 months after stopping it. Women should inform their healthcare professional if they wish to become pregnant or think they might be pregnant. Men should not father a child while taking this medicine and for 11 months after stopping it. There is potential for serious side effects to an unborn child. Talk to your healthcare professional for more information. Do not breast-feed an infant while taking this medicine. This medicine has caused ovarian failure in some women. This medicine may make it more difficult to get pregnant. Talk to your healthcare professional if you are concerned about your fertility. This medicine has caused decreased sperm counts in some men. This may make it more difficult to father a  child. Talk to your healthcare professional if you are concerned about your fertility. Drink fluids as directed while you are taking this medicine. This will help protect your kidneys. Call your doctor or health care professional if you get diarrhea. Do not treat yourself. What side effects may I notice from receiving this medication? Side effects that you should report to your doctor or health care professional as soon as possible: allergic reactions like skin rash, itching or hives, swelling of the face, lips, or tongue blurred vision changes in vision decreased hearing or ringing of the ears nausea, vomiting pain, redness, or irritation  at site where injected pain, tingling, numbness in the hands or feet signs and symptoms of bleeding such as bloody or black, tarry stools; red or dark Theron Cumbie urine; spitting up blood or Arriyana Rodell material that looks like coffee grounds; red spots on the skin; unusual bruising or bleeding from the eyes, gums, or nose signs and symptoms of infection like fever; chills; cough; sore throat; pain or trouble passing urine signs and symptoms of kidney injury like trouble passing urine or change in the amount of urine signs and symptoms of low red blood cells or anemia such as unusually weak or tired; feeling faint or lightheaded; falls; breathing problems Side effects that usually do not require medical attention (report to your doctor or health care professional if they continue or are bothersome): loss of appetite mouth sores muscle cramps This list may not describe all possible side effects. Call your doctor for medical advice about side effects. You may report side effects to FDA at 1-800-FDA-1088. Where should I keep my medication? This drug is given in a hospital or clinic and will not be stored at home. NOTE: This sheet is a summary. It may not cover all possible information. If you have questions about this medicine, talk to your doctor, pharmacist, or health care provider.  2023 Elsevier/Gold Standard (2021-07-29 00:00:00)        To help prevent nausea and vomiting after your treatment, we encourage you to take your nausea medication as directed.  BELOW ARE SYMPTOMS THAT SHOULD BE REPORTED IMMEDIATELY: *FEVER GREATER THAN 100.4 F (38 C) OR HIGHER *CHILLS OR SWEATING *NAUSEA AND VOMITING THAT IS NOT CONTROLLED WITH YOUR NAUSEA MEDICATION *UNUSUAL SHORTNESS OF BREATH *UNUSUAL BRUISING OR BLEEDING *URINARY PROBLEMS (pain or burning when urinating, or frequent urination) *BOWEL PROBLEMS (unusual diarrhea, constipation, pain near the anus) TENDERNESS IN MOUTH AND THROAT WITH OR WITHOUT PRESENCE  OF ULCERS (sore throat, sores in mouth, or a toothache) UNUSUAL RASH, SWELLING OR PAIN  UNUSUAL VAGINAL DISCHARGE OR ITCHING   Items with * indicate a potential emergency and should be followed up as soon as possible or go to the Emergency Department if any problems should occur.  Please show the CHEMOTHERAPY ALERT CARD or IMMUNOTHERAPY ALERT CARD at check-in to the Emergency Department and triage nurse.  Should you have questions after your visit or need to cancel or reschedule your appointment, please contact [facility] [phone]  and follow the prompts.  Office hours are 8:00 a.m. to 4:30 p.m. Monday - Friday. Please note that voicemails left after 4:00 p.m. may not be returned until the following business day.  We are closed weekends and major holidays. You have access to a nurse at all times for urgent questions. Please call the main number to the clinic [phone] and follow the prompts.  For any non-urgent questions, you may also contact your provider using MyChart. We now  offer e-Visits for anyone 76 and older to request care online for non-urgent symptoms. For details visit [email or web address].   Also download the MyChart app! Go to the app store, search "MyChart", open the app, select [facility], and log in with your MyChart username and password.  Masks are optional in the cancer centers. If you would like for your care team to wear a mask while they are taking care of you, please let them know. For doctor visits, patients may have with them one support person who is at least 67 years old. At this time, visitors are not allowed in the infusion area.

## 2022-03-22 NOTE — Progress Notes (Signed)
Patients port flushed without difficulty.  Good blood return noted with no bruising or swelling noted at site.  Stable during access and blood draw.  Patient to remain accessed for treatment. 

## 2022-03-24 ENCOUNTER — Encounter (HOSPITAL_COMMUNITY): Payer: Self-pay | Admitting: Hematology

## 2022-03-28 ENCOUNTER — Encounter (INDEPENDENT_AMBULATORY_CARE_PROVIDER_SITE_OTHER): Payer: Self-pay | Admitting: Gastroenterology

## 2022-03-28 ENCOUNTER — Ambulatory Visit (INDEPENDENT_AMBULATORY_CARE_PROVIDER_SITE_OTHER): Payer: Medicare Other | Admitting: Gastroenterology

## 2022-03-28 VITALS — BP 152/81 | HR 81 | Temp 97.6°F | Ht 72.0 in | Wt 266.2 lb

## 2022-03-28 DIAGNOSIS — R6889 Other general symptoms and signs: Secondary | ICD-10-CM | POA: Diagnosis not present

## 2022-03-28 DIAGNOSIS — K769 Liver disease, unspecified: Secondary | ICD-10-CM

## 2022-03-28 DIAGNOSIS — K5900 Constipation, unspecified: Secondary | ICD-10-CM

## 2022-03-28 NOTE — Progress Notes (Unsigned)
Referring Provider: Celene Squibb, MD Primary Care Physician:  Celene Squibb, MD Primary GI Physician: Jenetta Downer  Chief Complaint  Patient presents with   Constipation    Patient here today with previous issues of constipation, which he says has subsided. He now reports a lot of gas. He says he has large bm's that clog the toilet most days. Had recent tcs and egd 09/23/2021 done by Dr. Jenetta Downer   HPI:   Marvin Carr is a 66 y.o. male with past medical history of bladder cancer s/p local resection with TURBT and local chemo, OSA, HLD, anxiety, CAD, HTN  Patient presenting today for follow up.  Last seen 09/23/21, at that time reported tightnesss in abdomen since 10/22. Noticing pale stools, mild constiaption and having a BM every 3-4 days with pencil thin stools. Advised to start miralax, scheduled for EGD and colonoscopy. MR brain ordered as there was concern for metastatic liver lesion. MR brain negative.   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 He is having a lot of flatulence. He is able to pass it and has no pain with it. He is not taking anything for this. He is eating mostly nutrisystem foods. Denies excessive vegetables.   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 Doing chemotherapy every other week now.   Denies any swelling in his belly, pruritus or jaundice. He is still having some confusion consisting mostly as forgetfulness and feels that he gets drowsy and falls asleep very easily. No recent ammonia levels checked.   Denies nausea or vomiting, appetite is good.   Last Colonoscopy:- jan 2023 One 6 mm polyp in the proximal transverse colon, removed with a cold snare. Resected and retrieved. - Diverticulosis in the  sigmoid colon and in the descending colon. - The distal rectum and anal verge are normal on retroflexion view. Last Endoscopy:-jan 2023  2 cm hiatal hernia. - Congestive gastropathy. Biopsied. - Normal examined duodenum.  Recommendations:  Repeat in 7 years   Past Medical History:  Diagnosis Date   Anxiety    Bladder cancer ([facility]) 09/11/2009   CAD (coronary artery disease), native coronary artery    a. Mildly elevated troponin 03/2013, cath with nonobstructive disease including 50% AV groove distal stenosis before large OM   Essential hypertension    Headache(784.0)    History of migraines   Hyperglycemia    Mixed hyperlipidemia    Neuropathy    Obesity    Port-A-Cath in place 09/30/2021   Pre-diabetes    Seasonal allergies    Sleep apnea    On CPAP    Past Surgical History:  Procedure Laterality Date   BIOPSY  09/23/2021   Procedure: BIOPSY;  Surgeon: Harvel Quale, MD;  [facility];  Service: Gastroenterology;;   COLONOSCOPY  06/28/2011   Procedure: COLONOSCOPY;  Surgeon: Rogene Houston, MD;  [facility]: [facility];  Service: Endoscopy;  Laterality: N/A;   COLONOSCOPY WITH PROPOFOL N/A 09/23/2021   Procedure: COLONOSCOPY WITH PROPOFOL;  Surgeon: Harvel Quale, MD;  [facility]: [facility];  Service: Gastroenterology;  Laterality: N/A;  940   ESOPHAGOGASTRODUODENOSCOPY (EGD) WITH PROPOFOL N/A 09/23/2021   Procedure: ESOPHAGOGASTRODUODENOSCOPY (EGD) WITH PROPOFOL;  Surgeon: Harvel Quale, MD;  [facility]:  [facility];  Service: Gastroenterology;  Laterality: N/A;   IR IMAGING GUIDED PORT INSERTION  09/28/2021   IR PARACENTESIS  09/28/2021   JOINT REPLACEMENT Right    hip   LEFT HEART CATHETERIZATION WITH CORONARY ANGIOGRAM N/A 03/31/2013   Procedure: LEFT HEART CATHETERIZATION WITH CORONARY ANGIOGRAM;  Surgeon: Minus Breeding, MD;  Location: [facility];  Service: Cardiovascular;  Laterality: N/A;   POLYPECTOMY  09/23/2021    Procedure: POLYPECTOMY;  Surgeon: Montez Morita, Quillian Quince, MD;  Location: [facility];  Service: Gastroenterology;;   TURBT  09/11/2009    Current Outpatient Medications  Medication Sig Dispense Refill   amLODipine (NORVASC) 5 MG tablet Take 5 mg by mouth daily.     aspirin EC 81 MG tablet Take 81 mg by mouth daily.     buPROPion (WELLBUTRIN XL) 300 MG 24 hr tablet Take 300 mg by mouth daily.     CISPLATIN IV Inject into the vein once a week. Day 1, Day 8 q 21 days Every other week.     cyclobenzaprine (FLEXERIL) 10 MG tablet Take 10 mg by mouth at bedtime as needed for muscle spasms.     Durvalumab (IMFINZI IV) Inject into the vein every 21 ( twenty-one) days.     Eszopiclone 3 MG TABS Take 3 mg by mouth at bedtime. Take immediately before bedtime     famotidine (PEPCID) 20 MG tablet TAKE (1) TABLET BY MOUTH AT BEDTIME. 30 tablet 0   fenofibrate 160 MG tablet Take 160 mg by mouth daily.     FLUoxetine (PROZAC) 10 MG capsule Take 10 mg by mouth daily.     Gemcitabine HCl (GEMZAR IV) Inject into the vein once a week. Day 1, Day 8 q 21 days Every other week     lidocaine (XYLOCAINE) 2 % solution 5 mLs 4 (four) times daily as needed.     magnesium oxide (MAG-OX) 400 (240 Mg) MG tablet Take 1 tablet (400 mg total) by mouth 2 (two) times daily. (Patient taking differently: Take 400 mg by mouth 3 (three) times daily.) 90 tablet 6   metFORMIN (GLUCOPHAGE-XR) 500 MG 24 hr tablet Take 500 mg by mouth 2 (two) times daily.     nitroGLYCERIN (NITROSTAT) 0.4 MG SL tablet Place 1 tablet (0.4 mg total) under the tongue every 5 (five) minutes as needed for chest pain. 25 tablet 3   olmesartan (BENICAR) 40 MG tablet Take 1 tablet (40 mg total) by mouth daily. 30 tablet 6   omeprazole (PRILOSEC) 40 MG capsule Take 40 mg by mouth daily.     pantoprazole (PROTONIX) 40 MG tablet Take 1 tablet (40 mg total) by mouth daily. 90 tablet 3   pramipexole (MIRAPEX) 0.75 MG tablet Take 1 tablet (0.75 mg total) by  mouth 3 (three) times daily. 90 tablet 4   prochlorperazine (COMPAZINE) 10 MG tablet Take 1 tablet (10 mg total) by mouth every 6 (six) hours as needed (Nausea or vomiting). 30 tablet 1   RYBELSUS 7 MG TABS Take 7 mg by mouth daily.     sucralfate (CARAFATE) 1 GM/10ML suspension Take 10 mLs (1 g total) by mouth 4 (four) times daily. Swish and swallow 4 times daily as needed for mouth and throat irritation 420 mL 0   tamsulosin (FLOMAX) 0.4 MG CAPS capsule Take 0.4 mg by mouth daily.     traMADol (ULTRAM) 50 MG tablet Take 50 mg by mouth 4 (four) times daily as needed.     No  current facility-administered medications for this visit.    Allergies as of 03/28/2022   (No Known Allergies)    Family History  Problem Relation Age of Onset   COPD Father     Social History   Socioeconomic History   Marital status: Divorced    Spouse name: Not on file   Number of children: 0   Years of education: college   Highest education level: Not on file  Occupational History    Employer: SELF-EMPLOYED  Tobacco Use   Smoking status: Former    Packs/day: 0.50    Years: 10.00    Total pack years: 5.00    Types: Cigarettes    Quit date: 07/10/2011    Years since quitting: 10.7   Smokeless tobacco: Never   Tobacco comments:    Quit several yrs prior to 03/2013.  Vaping Use   Vaping Use: Never used  Substance and Sexual Activity   Alcohol use: Yes    Alcohol/week: 0.0 standard drinks of alcohol    Comment: Occasional   Drug use: No   Sexual activity: Not on file  Other Topics Concern   Not on file  Social History Narrative   Not on file   Social Determinants of Health   Financial Resource Strain: Not on file  Food Insecurity: Not on file  Transportation Needs: Not on file  Physical Activity: Not on file  Stress: Not on file  Social Connections: Not on file   Review of systems General: negative for malaise, night sweats, fever, chills, weight loss Neck: Negative for lumps, goiter,  pain and significant neck swelling Resp: Negative for cough, wheezing, dyspnea at rest CV: Negative for chest pain, leg swelling, palpitations, orthopnea GI: denies melena, hematochezia, nausea, vomiting, diarrhea, constipation, dysphagia, odyonophagia, early satiety or unintentional weight loss.  MSK: Negative for joint pain or swelling, back pain, and muscle pain. Derm: Negative for itching or rash Psych: Denies depression, anxiety, memory loss, confusion. No homicidal or suicidal ideation.  Heme: Negative for prolonged bleeding, bruising easily, and swollen nodes. Endocrine: Negative for cold or heat intolerance, polyuria, polydipsia and goiter. Neuro: negative for tremor, gait imbalance, syncope and seizures. The remainder of the review of systems is noncontributory.  Physical Exam: BP (!) 152/81 (BP Location: Left Arm, Patient Position: Sitting, Cuff Size: Large)   Pulse 81   Temp 97.6 F (36.4 C) (Oral)   Ht 6' (1.829 m)   Wt 266 lb 3.2 oz (120.7 kg)   BMI 36.10 kg/m  General:   Alert and oriented. No distress noted. Pleasant and cooperative.  Head:  Normocephalic and atraumatic. Eyes:  Conjuctiva clear without scleral icterus. Mouth:  Oral mucosa pink and moist. Good dentition. No lesions. Heart: Normal rate and rhythm, s1 and s2 heart sounds present.  Lungs: Clear lung sounds in all lobes. Respirations equal and unlabored. Abdomen:  +BS, soft, non-tender and non-distended. No rebound or guarding. No HSM or masses noted. Derm: No palmar erythema or jaundice Msk:  Symmetrical without gross deformities. Normal posture. Extremities:  Without edema. Neurologic:  Alert and  oriented x4 Psych:  Alert and cooperative. Normal mood and affect.  Invalid input(s): "6 MONTHS"   ASSESSMENT: Marvin Carr is a 66 y.o. male presenting today    PLAN:  Ammonia level  2. Benefiber TID with meals 3. Increase water intake to 64 oz/day   Follow Up: 6 months   Manuel Dall L. Alver Sorrow,  MSN, APRN, AGNP-C Adult-Gerontology Nurse Practitioner [facility]

## 2022-03-28 NOTE — Patient Instructions (Signed)
It was nice to meet you! We will check your ammonia level to ensure this is not elevated causing some of your forgetfulness/confusion, especially given the liver involvement you have.  In regards to the larger, harder stools, aim for atleast 64 oz of water per day, you can try starting benefiber (or generic verion of this) three times per day with meals, this will help to draw fluid into the colon to soften your stools  Follow up 6 months

## 2022-03-29 ENCOUNTER — Ambulatory Visit (HOSPITAL_COMMUNITY): Payer: Medicare Other | Admitting: Hematology

## 2022-03-29 ENCOUNTER — Ambulatory Visit (HOSPITAL_COMMUNITY): Payer: Medicare Other

## 2022-03-29 ENCOUNTER — Other Ambulatory Visit (HOSPITAL_COMMUNITY): Payer: Medicare Other

## 2022-03-29 DIAGNOSIS — R6889 Other general symptoms and signs: Secondary | ICD-10-CM | POA: Insufficient documentation

## 2022-03-29 LAB — AMMONIA: Ammonia: 43 umol/L (ref <=72)

## 2022-04-03 ENCOUNTER — Other Ambulatory Visit: Payer: Self-pay

## 2022-04-04 ENCOUNTER — Other Ambulatory Visit (HOSPITAL_COMMUNITY): Payer: Self-pay | Admitting: Physician Assistant

## 2022-04-04 DIAGNOSIS — K30 Functional dyspepsia: Secondary | ICD-10-CM

## 2022-04-05 ENCOUNTER — Encounter (HOSPITAL_COMMUNITY): Payer: Self-pay | Admitting: Hematology

## 2022-04-05 ENCOUNTER — Inpatient Hospital Stay (HOSPITAL_COMMUNITY): Payer: Medicare Other

## 2022-04-05 ENCOUNTER — Inpatient Hospital Stay (HOSPITAL_BASED_OUTPATIENT_CLINIC_OR_DEPARTMENT_OTHER): Payer: Medicare Other | Admitting: Hematology

## 2022-04-05 ENCOUNTER — Other Ambulatory Visit (HOSPITAL_COMMUNITY): Payer: Medicare Other

## 2022-04-05 ENCOUNTER — Ambulatory Visit (HOSPITAL_COMMUNITY): Payer: Medicare Other

## 2022-04-05 VITALS — BP 150/89 | HR 78 | Temp 97.2°F | Resp 20

## 2022-04-05 DIAGNOSIS — C221 Intrahepatic bile duct carcinoma: Secondary | ICD-10-CM

## 2022-04-05 DIAGNOSIS — C787 Secondary malignant neoplasm of liver and intrahepatic bile duct: Secondary | ICD-10-CM

## 2022-04-05 DIAGNOSIS — Z95828 Presence of other vascular implants and grafts: Secondary | ICD-10-CM

## 2022-04-05 DIAGNOSIS — Z5111 Encounter for antineoplastic chemotherapy: Secondary | ICD-10-CM | POA: Diagnosis not present

## 2022-04-05 LAB — COMPREHENSIVE METABOLIC PANEL
ALT: 26 U/L (ref 0–44)
AST: 24 U/L (ref 15–41)
Albumin: 3.8 g/dL (ref 3.5–5.0)
Alkaline Phosphatase: 59 U/L (ref 38–126)
Anion gap: 8 (ref 5–15)
BUN: 30 mg/dL — ABNORMAL HIGH (ref 8–23)
CO2: 26 mmol/L (ref 22–32)
Calcium: 9.2 mg/dL (ref 8.9–10.3)
Chloride: 103 mmol/L (ref 98–111)
Creatinine, Ser: 1.16 mg/dL (ref 0.61–1.24)
GFR, Estimated: >60 mL/min (ref >60)
Glucose, Bld: 122 mg/dL — ABNORMAL HIGH (ref 70–99)
Potassium: 4.2 mmol/L (ref 3.5–5.1)
Sodium: 137 mmol/L (ref 135–145)
Total Bilirubin: 0.6 mg/dL (ref 0.3–1.2)
Total Protein: 6.4 g/dL — ABNORMAL LOW (ref 6.5–8.1)

## 2022-04-05 LAB — CBC WITH DIFFERENTIAL/PLATELET
Abs Immature Granulocytes: 0.02 10*3/uL (ref 0.00–0.07)
Basophils Absolute: 0 10*3/uL (ref 0.0–0.1)
Basophils Relative: 1 %
Eosinophils Absolute: 0.1 10*3/uL (ref 0.0–0.5)
Eosinophils Relative: 3 %
HCT: 31.7 % — ABNORMAL LOW (ref 39.0–52.0)
Hemoglobin: 11 g/dL — ABNORMAL LOW (ref 13.0–17.0)
Immature Granulocytes: 1 %
Lymphocytes Relative: 25 %
Lymphs Abs: 0.9 10*3/uL (ref 0.7–4.0)
MCH: 34.5 pg — ABNORMAL HIGH (ref 26.0–34.0)
MCHC: 34.7 g/dL (ref 30.0–36.0)
MCV: 99.4 fL (ref 80.0–100.0)
Monocytes Absolute: 0.8 10*3/uL (ref 0.1–1.0)
Monocytes Relative: 20 %
Neutro Abs: 1.9 10*3/uL (ref 1.7–7.7)
Neutrophils Relative %: 50 %
Platelets: 161 10*3/uL (ref 150–400)
RBC: 3.19 MIL/uL — ABNORMAL LOW (ref 4.22–5.81)
RDW: 12.8 % (ref 11.5–15.5)
WBC: 3.8 10*3/uL — ABNORMAL LOW (ref 4.0–10.5)
nRBC: 0 % (ref 0.0–0.2)

## 2022-04-05 LAB — MAGNESIUM: Magnesium: 1.8 mg/dL (ref 1.7–2.4)

## 2022-04-05 MED ORDER — SODIUM CHLORIDE 0.9 % IV SOLN
150.0000 mg | Freq: Once | INTRAVENOUS | Status: AC
  Administered 2022-04-05: 150 mg via INTRAVENOUS
  Filled 2022-04-05: qty 150

## 2022-04-05 MED ORDER — SODIUM CHLORIDE 0.9% FLUSH
10.0000 mL | INTRAVENOUS | Status: DC | PRN
  Administered 2022-04-05: 10 mL

## 2022-04-05 MED ORDER — SODIUM CHLORIDE 0.9 % IV SOLN
25.0000 mg/m2 | Freq: Once | INTRAVENOUS | Status: AC
  Administered 2022-04-05: 62 mg via INTRAVENOUS
  Filled 2022-04-05: qty 62

## 2022-04-05 MED ORDER — SODIUM CHLORIDE 0.9 % IV SOLN: Freq: Once | INTRAVENOUS | Status: AC

## 2022-04-05 MED ORDER — SODIUM CHLORIDE 0.9 % IV SOLN
1000.0000 mg/m2 | Freq: Once | INTRAVENOUS | Status: AC
  Administered 2022-04-05: 2470 mg via INTRAVENOUS
  Filled 2022-04-05: qty 26.3

## 2022-04-05 MED ORDER — PALONOSETRON HCL INJECTION 0.25 MG/5ML
0.2500 mg | Freq: Once | INTRAVENOUS | Status: AC
  Administered 2022-04-05: 0.25 mg via INTRAVENOUS
  Filled 2022-04-05: qty 5

## 2022-04-05 MED ORDER — SODIUM CHLORIDE 0.9 % IV SOLN
1500.0000 mg | Freq: Once | INTRAVENOUS | Status: AC
  Administered 2022-04-05: 1500 mg via INTRAVENOUS
  Filled 2022-04-05: qty 30

## 2022-04-05 MED ORDER — HEPARIN SOD (PORK) LOCK FLUSH 100 UNIT/ML IV SOLN
500.0000 [IU] | Freq: Once | INTRAVENOUS | Status: AC | PRN
  Administered 2022-04-05: 500 [IU]

## 2022-04-05 MED ORDER — MAGNESIUM SULFATE 2 GM/50ML IV SOLN
2.0000 g | Freq: Once | INTRAVENOUS | Status: AC
  Administered 2022-04-05: 2 g via INTRAVENOUS
  Filled 2022-04-05: qty 50

## 2022-04-05 MED ORDER — POTASSIUM CHLORIDE IN NACL 20-0.9 MEQ/L-% IV SOLN
Freq: Once | INTRAVENOUS | Status: AC
  Filled 2022-04-05: qty 1000

## 2022-04-05 MED ORDER — SODIUM CHLORIDE 0.9 % IV SOLN
10.0000 mg | Freq: Once | INTRAVENOUS | Status: AC
  Administered 2022-04-05: 10 mg via INTRAVENOUS
  Filled 2022-04-05: qty 10

## 2022-04-05 NOTE — Progress Notes (Signed)
Confirmation received adding Imfinzi 1500 mg IVPB back into treatment plan for patient to receive dose today.  T.O. Dr Rhys Martini, PharmD

## 2022-04-05 NOTE — Progress Notes (Signed)
Labs reviewed today. Will proceed with treatment today per MD.   Treatment given per orders. Patient tolerated it well without problems. Vitals stable and discharged home from clinic ambulatory. Follow up as scheduled.

## 2022-04-05 NOTE — Patient Instructions (Addendum)
[facility] at [facility] Discharge Instructions   You were seen and examined today by Dr. Katragadda.  He reviewed the results of your lab work which are normal/stable.   We will proceed with your treatment today.  Return as scheduled.    Thank you for choosing [facility] at [facility] to provide your oncology and hematology care.  To afford each patient quality time with our provider, please arrive at least 15 minutes before your scheduled appointment time.   If you have a lab appointment with the Cancer Center please come in thru the Main Entrance and check in at the main information desk.  You need to re-schedule your appointment should you arrive 10 or more minutes late.  We strive to give you quality time with our providers, and arriving late affects you and other patients whose appointments are after yours.  Also, if you no show three or more times for appointments you may be dismissed from the clinic at the providers discretion.     Again, thank you for choosing [facility].  Our hope is that these requests will decrease the amount of time that you wait before being seen by our physicians.       _____________________________________________________________  Should you have questions after your visit to [facility], please contact our office at [phone] and follow the prompts.  Our office hours are 8:00 a.m. and 4:30 p.m. Monday - Friday.  Please note that voicemails left after 4:00 p.m. may not be returned until the following business day.  We are closed weekends and major holidays.  You do have access to a nurse 24-7, just call the main number to the clinic [phone] and do not press any options, hold on the line and a nurse will answer the phone.    For prescription refill requests, have your pharmacy contact our office and allow 72 hours.    Due to Covid, you will need to wear a mask upon entering  the hospital. If you do not have a mask, a mask will be given to you at the Main Entrance upon arrival. For doctor visits, patients may have 1 support person age 18 or older with them. For treatment visits, patients can not have anyone with them due to social distancing guidelines and our immunocompromised population.      

## 2022-04-05 NOTE — Progress Notes (Signed)
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  PD-L1 (OZ366) negative.  MSI-stable.  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, a 66 y.o. male, returns for routine follow-up and consideration for next cycle of chemotherapy. Jenny was last seen on 03/08/2022.  Due for cycle #9 of Cisplatin + Gemcitabine + Imfinzi today.   Overall, he tells me he has been feeling pretty well. He reports mouth sores folllowing last treatment that healed after 1 week. His tinnitus and neuropathy are stable. He denies abdominal pain, nausea, and vomiting. He reports fatigue for 2 days following treatment. He denies SOB and weight loss. He reports dry cough which is worse later in the day.   Overall, he feels ready for next cycle of chemo today.    REVIEW OF SYSTEMS:  Review of Systems  Constitutional:  Negative for appetite change, fatigue and unexpected weight change.  HENT:   Positive for tinnitus (stable). Negative for mouth sores (healed).   Respiratory:  Positive for cough (dry). Negative for shortness of breath.    Gastrointestinal:  Negative for abdominal pain, nausea and vomiting.  Neurological:  Positive for dizziness and numbness (stable).  Psychiatric/Behavioral:  Positive for sleep disturbance (improving).   All other systems reviewed and are negative.   PAST MEDICAL/SURGICAL HISTORY:  Past Medical History:  Diagnosis Date   Anxiety    Bladder cancer ([place]) 09/11/2009   CAD (coronary artery disease), native coronary artery    a. Mildly elevated troponin 03/2013, cath with nonobstructive disease including 50% AV groove distal stenosis before large OM   Essential hypertension    Headache(784.0)    History of migraines   Hyperglycemia    Mixed hyperlipidemia    Neuropathy    Obesity    Port-A-Cath in place 09/30/2021   Pre-diabetes    Seasonal allergies    Sleep apnea    On CPAP   Past Surgical History:  Procedure Laterality Date   BIOPSY  09/23/2021   Procedure: BIOPSY;  Surgeon: Harvel Quale, MD;  [facility];  Service: Gastroenterology;;   COLONOSCOPY  06/28/2011   Procedure: COLONOSCOPY;  Surgeon: Rogene Houston, MD;  [facility];  Service: Endoscopy;  Laterality: N/A;   COLONOSCOPY WITH PROPOFOL N/A 09/23/2021   Procedure: COLONOSCOPY WITH PROPOFOL;  Surgeon: Harvel Quale, MD;  [facility];  Service: Gastroenterology;  Laterality: N/A;  940   ESOPHAGOGASTRODUODENOSCOPY (EGD) WITH PROPOFOL N/A 09/23/2021   Procedure: ESOPHAGOGASTRODUODENOSCOPY (EGD) WITH PROPOFOL;  Surgeon: Harvel Quale, MD;  [facility];  Service: Gastroenterology;  Laterality: N/A;   IR IMAGING GUIDED  PORT INSERTION  09/28/2021   IR PARACENTESIS  09/28/2021   JOINT REPLACEMENT Right    hip   LEFT HEART CATHETERIZATION WITH CORONARY ANGIOGRAM N/A 03/31/2013   Procedure: LEFT HEART CATHETERIZATION WITH CORONARY ANGIOGRAM;  Surgeon: Minus Breeding, MD;  [facility] C[facility] LAB;  Service: Cardiovascular;  Laterality: N/A;   POLYPECTOMY   09/23/2021   Procedure: POLYPECTOMY;  Surgeon: Harvel Quale, MD;  [facility];  Service: Gastroenterology;;   TURBT  09/11/2009    SOCIAL HISTORY:  Social History   Socioeconomic History   Marital status: Divorced    Spouse name: Not on file   Number of children: 0   Years of education: college   Highest education level: Not on file  Occupational History    Employer: SELF-EMPLOYED  Tobacco Use   Smoking status: Former    Packs/day: 0.50    Years: 10.00    Total pack years: 5.00    Types: Cigarettes    Quit date: 07/10/2011    Years since quitting: 10.7   Smokeless tobacco: Never   Tobacco comments:    Quit several yrs prior to 03/2013.  Vaping Use   Vaping Use: Never used  Substance and Sexual Activity   Alcohol use: Yes    Alcohol/week: 0.0 standard drinks of alcohol    Comment: Occasional   Drug use: No   Sexual activity: Not on file  Other Topics Concern   Not on file  Social History Narrative   Not on file   Social Determinants of Health   Financial Resource Strain: Not on file  Food Insecurity: Not on file  Transportation Needs: Not on file  Physical Activity: Not on file  Stress: Not on file  Social Connections: Not on file  Intimate Partner Violence: Not on file    FAMILY HISTORY:  Family History  Problem Relation Age of Onset   COPD Father     CURRENT MEDICATIONS:  Current Outpatient Medications  Medication Sig Dispense Refill   amLODipine (NORVASC) 5 MG tablet Take 5 mg by mouth daily.     aspirin EC 81 MG tablet Take 81 mg by mouth daily.     buPROPion (WELLBUTRIN XL) 300 MG 24 hr tablet Take 300 mg by mouth daily.     CISPLATIN IV Inject into the vein once a week. Day 1, Day 8 q 21 days Every other week.     cyclobenzaprine (FLEXERIL) 10 MG tablet Take 10 mg by mouth at bedtime as needed for muscle spasms.     Durvalumab (IMFINZI IV) Inject into the vein every 21 ( twenty-one) days.     Eszopiclone 3 MG TABS Take 3  mg by mouth at bedtime. Take immediately before bedtime     famotidine (PEPCID) 20 MG tablet TAKE (1) TABLET BY MOUTH AT BEDTIME. 30 tablet 0   fenofibrate 160 MG tablet Take 160 mg by mouth daily.     FLUoxetine (PROZAC) 10 MG capsule Take 10 mg by mouth daily.     Gemcitabine HCl (GEMZAR IV) Inject into the vein once a week. Day 1, Day 8 q 21 days Every other week     lidocaine (XYLOCAINE) 2 % solution 5 mLs 4 (four) times daily as needed.     magnesium oxide (MAG-OX) 400 (240 Mg) MG tablet Take 1 tablet (400 mg total) by mouth 2 (two) times daily. (Patient taking differently: Take 400 mg by mouth 3 (three) times daily.) 90 tablet 6   metFORMIN (GLUCOPHAGE-XR)  500 MG 24 hr tablet Take 500 mg by mouth 2 (two) times daily.     nitroGLYCERIN (NITROSTAT) 0.4 MG SL tablet Place 1 tablet (0.4 mg total) under the tongue every 5 (five) minutes as needed for chest pain. 25 tablet 3   olmesartan (BENICAR) 40 MG tablet Take 1 tablet (40 mg total) by mouth daily. 30 tablet 6   omeprazole (PRILOSEC) 40 MG capsule Take 40 mg by mouth daily.     pantoprazole (PROTONIX) 40 MG tablet Take 1 tablet (40 mg total) by mouth daily. 90 tablet 3   pramipexole (MIRAPEX) 0.75 MG tablet Take 1 tablet (0.75 mg total) by mouth 3 (three) times daily. 90 tablet 4   prochlorperazine (COMPAZINE) 10 MG tablet Take 1 tablet (10 mg total) by mouth every 6 (six) hours as needed (Nausea or vomiting). 30 tablet 1   RYBELSUS 7 MG TABS Take 7 mg by mouth daily.     sucralfate (CARAFATE) 1 GM/10ML suspension Take 10 mLs (1 g total) by mouth 4 (four) times daily. Swish and swallow 4 times daily as needed for mouth and throat irritation 420 mL 0   tamsulosin (FLOMAX) 0.4 MG CAPS capsule Take 0.4 mg by mouth daily.     traMADol (ULTRAM) 50 MG tablet Take 50 mg by mouth 4 (four) times daily as needed.     No current facility-administered medications for this visit.    ALLERGIES:  No Known Allergies  PHYSICAL EXAM:  Performance status  (ECOG): 0 - Asymptomatic  There were no vitals filed for this visit. Wt Readings from Last 3 Encounters:  03/28/22 266 lb 3.2 oz (120.7 kg)  03/22/22 266 lb 12.8 oz (121 kg)  03/08/22 266 lb 11.2 oz (121 kg)   Physical Exam Vitals reviewed.  Constitutional:      Appearance: Normal appearance.  Cardiovascular:     Rate and Rhythm: Normal rate and regular rhythm.     Pulses: Normal pulses.     Heart sounds: Normal heart sounds.  Pulmonary:     Effort: Pulmonary effort is normal.     Breath sounds: Normal breath sounds.  Neurological:     General: No focal deficit present.     Mental Status: He is alert and oriented to person, place, and time.  Psychiatric:        Mood and Affect: Mood normal.        Behavior: Behavior normal.     LABORATORY DATA:  I have reviewed the labs as listed.     Latest Ref Rng & Units 03/22/2022    8:03 AM 03/08/2022    7:44 AM 02/28/2022    2:42 PM  CBC  WBC 4.0 - 10.5 K/uL 4.1  8.5  18.8   Hemoglobin 13.0 - 17.0 g/dL 10.8  10.7  10.6   Hematocrit 39.0 - 52.0 % 31.1  31.1  31.2   Platelets 150 - 400 K/uL 174  168  96       Latest Ref Rng & Units 03/22/2022    8:03 AM 03/08/2022    7:44 AM 02/28/2022    2:42 PM  CMP  Glucose 70 - 99 mg/dL 124  117  156   BUN 8 - 23 mg/dL 29  41  27   Creatinine 0.61 - 1.24 mg/dL 1.14  1.15  1.18   Sodium 135 - 145 mmol/L 138  135  136   Potassium 3.5 - 5.1 mmol/L 4.3  4.0  4.1   Chloride 98 -  111 mmol/L 106  103  102   CO2 22 - 32 mmol/L _0 Calcium 8.9 - 10.3 mg/dL 9.1  8.9  9.4   Total Protein 6.5 - 8.1 g/dL 6.2  6.4  6.4   Total Bilirubin 0.3 - 1.2 mg/dL 0.5  0.4  0.2   Alkaline Phos 38 - 126 U/L 77  129  145   AST 15 - 41 U/L _1 ALT 0 - 44 U/L _2 DIAGNOSTIC IMAGING:  I have independently reviewed the scans and discussed with the patient. No results found.   ASSESSMENT:  Liver lesion/peritoneal carcinomatosis: - Patient seen at the request of Dr. Delphina Cahill -  Reported pressure on the sides of the abdomen with slight pain for the last 1 month.  He also reported pain in the epigastric region. - He had lost 20 pounds in the last 3 to 4 months intentionally, cutting back on sugars.  He was also started on semaglutide. - Colonoscopy on 06/28/2011 with a benign polypoid colonic mucosa in the descending colon.  No evidence of malignancy. - MRI of the brain was negative. - EGD and colonoscopy on 09/23/2018 did not reveal any malignancies. 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  IHC positive for CK7, CDX2 and negative for GATA3.  Findings suggestive of upper GI or pancreaticobiliary primary. - Cycle 1 of gemcitabine, cisplatin and durvalumab started on 10/05/2021. - NGS: No targetable mutations.  PD-L1 (HQ469) negative.  MSI-stable.  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   Social/family history: - He lives at home by himself.  He records voiceover/narrations. - Non-smoker. - Father died of MDS.  Paternal grandfather had prostate cancer.  Maternal grandfather had cancer.  3.  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 Alinda Money.  Reportedly received 1 treatment of intravesical chemo and has been on surveillance since then.  Last surveillance visit was in 2020.   PLAN:  Cholangiocarcinoma with peritoneal carcinomatosis: - CT CAP (03/04/2022): Minimal new peripheral reticular and tree-in-bud opacities in the left upper lobe inflammatory/infectious.  Left lower lobe lesion is decreased in size.  No new lesions. - We held his immunotherapy at last treatment with possibility of pneumonitis.  He received Medrol Dosepak. - He reports some evening cough which is stable.  Denies any worsening shortness of breath. - Reviewed labs today which showed normal LFTs and creatinine.  CBC shows mild leukopenia and anemia.  CEA was 2.6. - He will proceed with day 1 and day 15 of chemotherapy today.  He  will also receive immunotherapy.  RTC 4 weeks for follow-up.   Hypertension: - Continue Benicar and amlodipine daily.  Blood pressure is well controlled.  3.  Neuropathy/"drawing" of legs: - Continue Mirapex 0.75 mg 3 times daily.  4.  Sleeping difficulty: - Continue Lunesta as needed.  5.  Hypomagnesemia: - Continue magnesium 3 times daily.  Magnesium is normal.   Orders placed this encounter:  No orders of the defined types were placed in this encounter.    Derek Jack, MD [place] [phone]   I, Thana Ates, am acting as a scribe for Dr. Derek Jack.  I, Derek Jack MD, have reviewed the above documentation for accuracy and completeness, and I agree with the above.

## 2022-04-05 NOTE — Patient Instructions (Signed)
[facility]  Discharge Instructions: Thank you for choosing [facility] to provide your oncology and hematology care.  If you have a lab appointment with the [facility], please come in thru the Main Entrance and check in at the main information desk.  Wear comfortable clothing and clothing appropriate for easy access to any Portacath or PICC line.   We strive to give you quality time with your provider. You may need to reschedule your appointment if you arrive late (15 or more minutes).  Arriving late affects you and other patients whose appointments are after yours.  Also, if you miss three or more appointments without notifying the office, you may be dismissed from the clinic at the provider's discretion.      For prescription refill requests, have your pharmacy contact our office and allow 72 hours for refills to be completed.    Today you received the following chemotherapy and/or immunotherapy agents imfinzi, gemzar, cisplantin      To help prevent nausea and vomiting after your treatment, we encourage you to take your nausea medication as directed.  BELOW ARE SYMPTOMS THAT SHOULD BE REPORTED IMMEDIATELY: *FEVER GREATER THAN 100.4 F (38 C) OR HIGHER *CHILLS OR SWEATING *NAUSEA AND VOMITING THAT IS NOT CONTROLLED WITH YOUR NAUSEA MEDICATION *UNUSUAL SHORTNESS OF BREATH *UNUSUAL BRUISING OR BLEEDING *URINARY PROBLEMS (pain or burning when urinating, or frequent urination) *BOWEL PROBLEMS (unusual diarrhea, constipation, pain near the anus) TENDERNESS IN MOUTH AND THROAT WITH OR WITHOUT PRESENCE OF ULCERS (sore throat, sores in mouth, or a toothache) UNUSUAL RASH, SWELLING OR PAIN  UNUSUAL VAGINAL DISCHARGE OR ITCHING   Items with * indicate a potential emergency and should be followed up as soon as possible or go to the Emergency Department if any problems should occur.  Please show the CHEMOTHERAPY ALERT CARD or IMMUNOTHERAPY ALERT CARD at check-in to  the Emergency Department and triage nurse.  Should you have questions after your visit or need to cancel or reschedule your appointment, please contact [facility] [phone]  and follow the prompts.  Office hours are 8:00 a.m. to 4:30 p.m. Monday - Friday. Please note that voicemails left after 4:00 p.m. may not be returned until the following business day.  We are closed weekends and major holidays. You have access to a nurse at all times for urgent questions. Please call the main number to the clinic [phone] and follow the prompts.  For any non-urgent questions, you may also contact your provider using MyChart. We now offer e-Visits for anyone 101 and older to request care online for non-urgent symptoms. For details visit [email or web address].   Also download the MyChart app! Go to the app store, search "MyChart", open the app, select [facility], and log in with your MyChart username and password.  Masks are optional in the cancer centers. If you would like for your care team to wear a mask while they are taking care of you, please let them know. For doctor visits, patients may have with them one support person who is at least 67 years old. At this time, visitors are not allowed in the infusion area.

## 2022-04-07 ENCOUNTER — Ambulatory Visit (HOSPITAL_COMMUNITY): Payer: Medicare Other

## 2022-04-11 ENCOUNTER — Other Ambulatory Visit: Payer: Self-pay

## 2022-04-19 ENCOUNTER — Inpatient Hospital Stay: Payer: Medicare Other | Attending: Hematology

## 2022-04-19 ENCOUNTER — Inpatient Hospital Stay: Payer: Medicare Other

## 2022-04-19 VITALS — BP 128/77 | HR 81 | Temp 97.4°F | Resp 18 | Ht 72.0 in | Wt 269.0 lb

## 2022-04-19 DIAGNOSIS — Z87891 Personal history of nicotine dependence: Secondary | ICD-10-CM | POA: Diagnosis not present

## 2022-04-19 DIAGNOSIS — G629 Polyneuropathy, unspecified: Secondary | ICD-10-CM | POA: Diagnosis not present

## 2022-04-19 DIAGNOSIS — Z809 Family history of malignant neoplasm, unspecified: Secondary | ICD-10-CM | POA: Insufficient documentation

## 2022-04-19 DIAGNOSIS — Z5111 Encounter for antineoplastic chemotherapy: Secondary | ICD-10-CM | POA: Diagnosis not present

## 2022-04-19 DIAGNOSIS — D72819 Decreased white blood cell count, unspecified: Secondary | ICD-10-CM | POA: Diagnosis not present

## 2022-04-19 DIAGNOSIS — C221 Intrahepatic bile duct carcinoma: Secondary | ICD-10-CM | POA: Insufficient documentation

## 2022-04-19 DIAGNOSIS — Z8551 Personal history of malignant neoplasm of bladder: Secondary | ICD-10-CM | POA: Diagnosis not present

## 2022-04-19 DIAGNOSIS — Z79899 Other long term (current) drug therapy: Secondary | ICD-10-CM | POA: Diagnosis not present

## 2022-04-19 DIAGNOSIS — C786 Secondary malignant neoplasm of retroperitoneum and peritoneum: Secondary | ICD-10-CM | POA: Insufficient documentation

## 2022-04-19 DIAGNOSIS — G4700 Insomnia, unspecified: Secondary | ICD-10-CM | POA: Diagnosis not present

## 2022-04-19 DIAGNOSIS — I10 Essential (primary) hypertension: Secondary | ICD-10-CM | POA: Insufficient documentation

## 2022-04-19 DIAGNOSIS — Z808 Family history of malignant neoplasm of other organs or systems: Secondary | ICD-10-CM | POA: Diagnosis not present

## 2022-04-19 DIAGNOSIS — R059 Cough, unspecified: Secondary | ICD-10-CM | POA: Diagnosis not present

## 2022-04-19 DIAGNOSIS — H9313 Tinnitus, bilateral: Secondary | ICD-10-CM | POA: Diagnosis not present

## 2022-04-19 DIAGNOSIS — D649 Anemia, unspecified: Secondary | ICD-10-CM | POA: Diagnosis not present

## 2022-04-19 DIAGNOSIS — Z8042 Family history of malignant neoplasm of prostate: Secondary | ICD-10-CM | POA: Insufficient documentation

## 2022-04-19 DIAGNOSIS — Z95828 Presence of other vascular implants and grafts: Secondary | ICD-10-CM

## 2022-04-19 DIAGNOSIS — Z08 Encounter for follow-up examination after completed treatment for malignant neoplasm: Secondary | ICD-10-CM

## 2022-04-19 LAB — CBC WITH DIFFERENTIAL/PLATELET
Abs Immature Granulocytes: 0.02 10*3/uL (ref 0.00–0.07)
Basophils Absolute: 0 10*3/uL (ref 0.0–0.1)
Basophils Relative: 1 %
Eosinophils Absolute: 0.1 10*3/uL (ref 0.0–0.5)
Eosinophils Relative: 3 %
HCT: 30.9 % — ABNORMAL LOW (ref 39.0–52.0)
Hemoglobin: 10.6 g/dL — ABNORMAL LOW (ref 13.0–17.0)
Immature Granulocytes: 0 %
Lymphocytes Relative: 29 %
Lymphs Abs: 1.3 10*3/uL (ref 0.7–4.0)
MCH: 34 pg (ref 26.0–34.0)
MCHC: 34.3 g/dL (ref 30.0–36.0)
MCV: 99 fL (ref 80.0–100.0)
Monocytes Absolute: 0.9 10*3/uL (ref 0.1–1.0)
Monocytes Relative: 21 %
Neutro Abs: 2.1 10*3/uL (ref 1.7–7.7)
Neutrophils Relative %: 46 %
Platelets: 159 10*3/uL (ref 150–400)
RBC: 3.12 MIL/uL — ABNORMAL LOW (ref 4.22–5.81)
RDW: 13.2 % (ref 11.5–15.5)
WBC: 4.5 10*3/uL (ref 4.0–10.5)
nRBC: 0 % (ref 0.0–0.2)

## 2022-04-19 LAB — COMPREHENSIVE METABOLIC PANEL
ALT: 21 U/L (ref 0–44)
AST: 25 U/L (ref 15–41)
Albumin: 3.8 g/dL (ref 3.5–5.0)
Alkaline Phosphatase: 57 U/L (ref 38–126)
Anion gap: 8 (ref 5–15)
BUN: 35 mg/dL — ABNORMAL HIGH (ref 8–23)
CO2: 25 mmol/L (ref 22–32)
Calcium: 9 mg/dL (ref 8.9–10.3)
Chloride: 101 mmol/L (ref 98–111)
Creatinine, Ser: 1.32 mg/dL — ABNORMAL HIGH (ref 0.61–1.24)
GFR, Estimated: 59 mL/min — ABNORMAL LOW (ref >60)
Glucose, Bld: 98 mg/dL (ref 70–99)
Potassium: 3.9 mmol/L (ref 3.5–5.1)
Sodium: 134 mmol/L — ABNORMAL LOW (ref 135–145)
Total Bilirubin: 0.7 mg/dL (ref 0.3–1.2)
Total Protein: 6.5 g/dL (ref 6.5–8.1)

## 2022-04-19 LAB — MAGNESIUM: Magnesium: 1.9 mg/dL (ref 1.7–2.4)

## 2022-04-19 MED ORDER — HEPARIN SOD (PORK) LOCK FLUSH 100 UNIT/ML IV SOLN
500.0000 [IU] | Freq: Once | INTRAVENOUS | Status: AC | PRN
  Administered 2022-04-19: 500 [IU]

## 2022-04-19 MED ORDER — PALONOSETRON HCL INJECTION 0.25 MG/5ML
INTRAVENOUS | Status: AC
  Administered 2022-04-19: 0.25 mg
  Filled 2022-04-19: qty 5

## 2022-04-19 MED ORDER — SODIUM CHLORIDE 0.9 % IV SOLN: Freq: Once | INTRAVENOUS | Status: AC

## 2022-04-19 MED ORDER — SODIUM CHLORIDE 0.9 % IV SOLN
10.0000 mg | Freq: Once | INTRAVENOUS | Status: AC
  Administered 2022-04-19: 10 mg via INTRAVENOUS
  Filled 2022-04-19: qty 10

## 2022-04-19 MED ORDER — PALONOSETRON HCL INJECTION 0.25 MG/5ML: 0.2500 mg | Freq: Once | INTRAVENOUS | Status: AC

## 2022-04-19 MED ORDER — SODIUM CHLORIDE 0.9 % IV SOLN
150.0000 mg | Freq: Once | INTRAVENOUS | Status: AC
  Administered 2022-04-19: 150 mg via INTRAVENOUS
  Filled 2022-04-19: qty 5

## 2022-04-19 MED ORDER — SODIUM CHLORIDE 0.9 % IV SOLN
1000.0000 mg/m2 | Freq: Once | INTRAVENOUS | Status: AC
  Administered 2022-04-19: 2470 mg via INTRAVENOUS
  Filled 2022-04-19: qty 52.6

## 2022-04-19 MED ORDER — SODIUM CHLORIDE 0.9 % IV SOLN
25.0000 mg/m2 | Freq: Once | INTRAVENOUS | Status: AC
  Administered 2022-04-19: 62 mg via INTRAVENOUS
  Filled 2022-04-19: qty 62

## 2022-04-19 MED ORDER — MAGNESIUM SULFATE 2 GM/50ML IV SOLN
INTRAVENOUS | Status: AC
  Filled 2022-04-19: qty 50

## 2022-04-19 MED ORDER — POTASSIUM CHLORIDE IN NACL 20-0.9 MEQ/L-% IV SOLN
Freq: Once | INTRAVENOUS | Status: AC
  Filled 2022-04-19: qty 1000

## 2022-04-19 MED ORDER — SODIUM CHLORIDE 0.9% FLUSH
10.0000 mL | INTRAVENOUS | Status: DC | PRN
  Administered 2022-04-19: 10 mL

## 2022-04-19 MED ORDER — MAGNESIUM SULFATE 2 GM/50ML IV SOLN
2.0000 g | Freq: Once | INTRAVENOUS | Status: AC
  Administered 2022-04-19: 2 g via INTRAVENOUS

## 2022-04-19 NOTE — Progress Notes (Signed)
Pt presents today for Gemzar and Cisplatin per provider's order. Vital signs and labs WNL for treatment today. Okay to proceed with treatment today.   Pt urinated 400 mL prior to Cisplatin administration.  Gemzar and Cisplatin given today per MD orders. Tolerated infusion without adverse affects. Vital signs stable. No complaints at this time. Discharged from clinic ambulatory in stable condition. Alert and oriented x 3. F/U with [facility] as scheduled.

## 2022-04-19 NOTE — Progress Notes (Signed)
Patients port flushed without difficulty.  Good blood return noted with no bruising or swelling noted at site.  Stable during access and blood draw.  Patient to remain accessed for treatment. 

## 2022-04-19 NOTE — Patient Instructions (Signed)
[facility]  Discharge Instructions: Thank you for choosing [facility] to provide your oncology and hematology care.  If you have a lab appointment with the [facility], please come in thru the Main Entrance and check in at the main information desk.  Wear comfortable clothing and clothing appropriate for easy access to any Portacath or PICC line.   We strive to give you quality time with your provider. You may need to reschedule your appointment if you arrive late (15 or more minutes).  Arriving late affects you and other patients whose appointments are after yours.  Also, if you miss three or more appointments without notifying the office, you may be dismissed from the clinic at the provider's discretion.      For prescription refill requests, have your pharmacy contact our office and allow 72 hours for refills to be completed.    Today you received the following chemotherapy and/or immunotherapy agents Gemzar and Cisplatin   To help prevent nausea and vomiting after your treatment, we encourage you to take your nausea medication as directed.  BELOW ARE SYMPTOMS THAT SHOULD BE REPORTED IMMEDIATELY: *FEVER GREATER THAN 100.4 F (38 C) OR HIGHER *CHILLS OR SWEATING *NAUSEA AND VOMITING THAT IS NOT CONTROLLED WITH YOUR NAUSEA MEDICATION *UNUSUAL SHORTNESS OF BREATH *UNUSUAL BRUISING OR BLEEDING *URINARY PROBLEMS (pain or burning when urinating, or frequent urination) *BOWEL PROBLEMS (unusual diarrhea, constipation, pain near the anus) TENDERNESS IN MOUTH AND THROAT WITH OR WITHOUT PRESENCE OF ULCERS (sore throat, sores in mouth, or a toothache) UNUSUAL RASH, SWELLING OR PAIN  UNUSUAL VAGINAL DISCHARGE OR ITCHING   Items with * indicate a potential emergency and should be followed up as soon as possible or go to the Emergency Department if any problems should occur.  Please show the CHEMOTHERAPY ALERT CARD or IMMUNOTHERAPY ALERT CARD at check-in to the  Emergency Department and triage nurse.  Should you have questions after your visit or need to cancel or reschedule your appointment, please contact [facility] [phone]  and follow the prompts.  Office hours are 8:00 a.m. to 4:30 p.m. Monday - Friday. Please note that voicemails left after 4:00 p.m. may not be returned until the following business day.  We are closed weekends and major holidays. You have access to a nurse at all times for urgent questions. Please call the main number to the clinic [phone] and follow the prompts.  For any non-urgent questions, you may also contact your provider using MyChart. We now offer e-Visits for anyone 58 and older to request care online for non-urgent symptoms. For details visit [email or web address].   Also download the MyChart app! Go to the app store, search "MyChart", open the app, select [facility], and log in with your MyChart username and password.  Masks are optional in the cancer centers. If you would like for your care team to wear a mask while they are taking care of you, please let them know. For doctor visits, patients may have with them one support person who is at least 67 years old. At this time, visitors are not allowed in the infusion area.  Gemcitabine Injection What is this medication? GEMCITABINE (jem SYE ta been) treats some types of cancer. It works by slowing down the growth of cancer cells. This medicine may be used for other purposes; ask your health care provider or pharmacist if you have questions. COMMON BRAND NAME(S): Gemzar, Infugem What should I tell my care team before  I take this medication? They need to know if you have any of these conditions: Blood disorders Infection Kidney disease Liver disease Lung or breathing disease, such as asthma or COPD Recent or ongoing radiation therapy An unusual or allergic reaction to gemcitabine, other medications, foods, dyes, or preservatives If you  or your partner are pregnant or trying to get pregnant Breast-feeding How should I use this medication? This medication is injected into a vein. It is given by your care team in a hospital or clinic setting. Talk to your care team about the use of this medication in children. Special care may be needed. Overdosage: If you think you have taken too much of this medicine contact a poison control center or emergency room at once. NOTE: This medicine is only for you. Do not share this medicine with others. What if I miss a dose? Keep appointments for follow-up doses. It is important not to miss your dose. Call your care team if you are unable to keep an appointment. What may interact with this medication? Interactions have not been studied. This list may not describe all possible interactions. Give your health care provider a list of all the medicines, herbs, non-prescription drugs, or dietary supplements you use. Also tell them if you smoke, drink alcohol, or use illegal drugs. Some items may interact with your medicine. What should I watch for while using this medication? Your condition will be monitored carefully while you are receiving this medication. This medication may make you feel generally unwell. This is not uncommon, as chemotherapy can affect healthy cells as well as cancer cells. Report any side effects. Continue your course of treatment even though you feel ill unless your care team tells you to stop. In some cases, you may be given additional medications to help with side effects. Follow all directions for their use. This medication may increase your risk of getting an infection. Call your care team for advice if you get a fever, chills, sore throat, or other symptoms of a cold or flu. Do not treat yourself. Try to avoid being around people who are sick. This medication may increase your risk to bruise or bleed. Call your care team if you notice any unusual bleeding. Be careful brushing  or flossing your teeth or using a toothpick because you may get an infection or bleed more easily. If you have any dental work done, tell your dentist you are receiving this medication. Avoid taking medications that contain aspirin, acetaminophen, ibuprofen, naproxen, or ketoprofen unless instructed by your care team. These medications may hide a fever. Talk to your care team if you or your partner wish to become pregnant or think you might be pregnant. This medication can cause serious birth defects if taken during pregnancy and for 6 months after the last dose. A negative pregnancy test is required before starting this medication. A reliable form of contraception is recommended while taking this medication and for 6 months after the last dose. Talk to your care team about effective forms of contraception. Do not father a child while taking this medication and for 3 months after the last dose. Use a condom while having sex during this time period. Do not breastfeed while taking this medication and for at least 1 week after the last dose. This medication may cause infertility. Talk to your care team if you are concerned about your fertility. What side effects may I notice from receiving this medication? Side effects that you should report to your care  team as soon as possible: Allergic reactions--skin rash, itching, hives, swelling of the face, lips, tongue, or throat Capillary leak syndrome--stomach or muscle pain, unusual weakness or fatigue, feeling faint or lightheaded, decrease in the amount of urine, swelling of the ankles, hands, or feet, trouble breathing Infection--fever, chills, cough, sore throat, wounds that don't heal, pain or trouble when passing urine, general feeling of discomfort or being unwell Liver injury--right upper belly pain, loss of appetite, nausea, light-colored stool, dark yellow or brown urine, yellowing skin or eyes, unusual weakness or fatigue Low red blood cell  level--unusual weakness or fatigue, dizziness, headache, trouble breathing Lung injury--shortness of breath or trouble breathing, cough, spitting up blood, chest pain, fever Stomach pain, bloody diarrhea, pale skin, unusual weakness or fatigue, decrease in the amount of urine, which may be signs of hemolytic uremic syndrome Sudden and severe headache, confusion, change in vision, seizures, which may be signs of posterior reversible encephalopathy syndrome (PRES) Unusual bruising or bleeding Side effects that usually do not require medical attention (report to your care team if they continue or are bothersome): Diarrhea Drowsiness Hair loss Nausea Pain, redness, or swelling with sores inside the mouth or throat Vomiting This list may not describe all possible side effects. Call your doctor for medical advice about side effects. You may report side effects to FDA at 1-800-FDA-1088. Where should I keep my medication? This medication is given in a hospital or clinic. It will not be stored at home. NOTE: This sheet is a summary. It may not cover all possible information. If you have questions about this medicine, talk to your doctor, pharmacist, or health care provider.  2023 Elsevier/Gold Standard (2021-12-28 00:00:00)  Cisplatin Injection What is this medication? CISPLATIN (SIS pla tin) treats some types of cancer. It works by slowing down the growth of cancer cells. This medicine may be used for other purposes; ask your health care provider or pharmacist if you have questions. COMMON BRAND NAME(S): Platinol, Platinol -AQ What should I tell my care team before I take this medication? They need to know if you have any of these conditions: Eye disease, vision problems Hearing problems Kidney disease Low blood counts, such as low white cells, platelets, or red blood cells Tingling of the fingers or toes, or other nerve disorder An unusual or allergic reaction to cisplatin, carboplatin,  oxaliplatin, other medications, foods, dyes, or preservatives If you or your partner are pregnant or trying to get pregnant Breast-feeding How should I use this medication? This medication is injected into a vein. It is given by your care team in a hospital or clinic setting. Talk to your care team about the use of this medication in children. Special care may be needed. Overdosage: If you think you have taken too much of this medicine contact a poison control center or emergency room at once. NOTE: This medicine is only for you. Do not share this medicine with others. What if I miss a dose? Keep appointments for follow-up doses. It is important not to miss your dose. Call your care team if you are unable to keep an appointment. What may interact with this medication? Do not take this medication with any of the following: Live virus vaccines This medication may also interact with the following: Certain antibiotics, such as amikacin, gentamicin, neomycin, polymyxin B, streptomycin, tobramycin, vancomycin Foscarnet This list may not describe all possible interactions. Give your health care provider a list of all the medicines, herbs, non-prescription drugs, or dietary supplements you use.  Also tell them if you smoke, drink alcohol, or use illegal drugs. Some items may interact with your medicine. What should I watch for while using this medication? Your condition will be monitored carefully while you are receiving this medication. You may need blood work done while taking this medication. This medication may make you feel generally unwell. This is not uncommon, as chemotherapy can affect healthy cells as well as cancer cells. Report any side effects. Continue your course of treatment even though you feel ill unless your care team tells you to stop. This medication may increase your risk of getting an infection. Call your care team for advice if you get a fever, chills, sore throat, or other  symptoms of a cold or flu. Do not treat yourself. Try to avoid being around people who are sick. Avoid taking medications that contain aspirin, acetaminophen, ibuprofen, naproxen, or ketoprofen unless instructed by your care team. These medications may hide a fever. This medication may increase your risk to bruise or bleed. Call your care team if you notice any unusual bleeding. Be careful brushing or flossing your teeth or using a toothpick because you may get an infection or bleed more easily. If you have any dental work done, tell your dentist you are receiving this medication. Drink fluids as directed while you are taking this medication. This will help protect your kidneys. Call your care team if you get diarrhea. Do not treat yourself. Talk to your care team if you or your partner wish to become pregnant or think you might be pregnant. This medication can cause serious birth defects if taken during pregnancy and for 14 months after the last dose. A negative pregnancy test is required before starting this medication. A reliable form of contraception is recommended while taking this medication and for 14 months after the last dose. Talk to your care team about effective forms of contraception. Do not father a child while taking this medication and for 11 months after the last dose. Use a condom during sex during this time period. Do not breast-feed while taking this medication. This medication may cause infertility. Talk to your care team if you are concerned about your fertility. What side effects may I notice from receiving this medication? Side effects that you should report to your care team as soon as possible: Allergic reactions--skin rash, itching, hives, swelling of the face, lips, tongue, or throat Eye pain, change in vision, vision loss Hearing loss, ringing in ears Infection--fever, chills, cough, sore throat, wounds that don't heal, pain or trouble when passing urine, general feeling of  discomfort or being unwell Kidney injury--decrease in the amount of urine, swelling of the ankles, hands, or feet Low red blood cell level--unusual weakness or fatigue, dizziness, headache, trouble breathing Painful swelling, warmth, or redness of the skin, blisters or sores at the infusion site Pain, tingling, or numbness in the hands or feet Unusual bruising or bleeding Side effects that usually do not require medical attention (report to your care team if they continue or are bothersome): Hair loss Nausea Vomiting This list may not describe all possible side effects. Call your doctor for medical advice about side effects. You may report side effects to FDA at 1-800-FDA-1088. Where should I keep my medication? This medication is given in a hospital or clinic. It will not be stored at home. NOTE: This sheet is a summary. It may not cover all possible information. If you have questions about this medicine, talk to your doctor, pharmacist,  or health care provider.  2023 Elsevier/Gold Standard (2021-12-26 00:00:00)

## 2022-04-21 LAB — CEA: CEA: 2.9 ng/mL (ref 0.0–4.7)

## 2022-05-03 ENCOUNTER — Inpatient Hospital Stay (HOSPITAL_BASED_OUTPATIENT_CLINIC_OR_DEPARTMENT_OTHER): Payer: Medicare Other | Admitting: Hematology

## 2022-05-03 ENCOUNTER — Inpatient Hospital Stay: Payer: Medicare Other

## 2022-05-03 ENCOUNTER — Inpatient Hospital Stay: Payer: Medicare Other | Admitting: Hematology

## 2022-05-03 ENCOUNTER — Other Ambulatory Visit (HOSPITAL_COMMUNITY): Payer: Self-pay | Admitting: Hematology

## 2022-05-03 VITALS — BP 133/76 | HR 81 | Temp 98.0°F | Resp 18

## 2022-05-03 DIAGNOSIS — C221 Intrahepatic bile duct carcinoma: Secondary | ICD-10-CM

## 2022-05-03 DIAGNOSIS — Z95828 Presence of other vascular implants and grafts: Secondary | ICD-10-CM

## 2022-05-03 DIAGNOSIS — Z8509 Personal history of malignant neoplasm of other digestive organs: Secondary | ICD-10-CM

## 2022-05-03 DIAGNOSIS — Z79899 Other long term (current) drug therapy: Secondary | ICD-10-CM | POA: Diagnosis not present

## 2022-05-03 DIAGNOSIS — Z08 Encounter for follow-up examination after completed treatment for malignant neoplasm: Secondary | ICD-10-CM

## 2022-05-03 DIAGNOSIS — C787 Secondary malignant neoplasm of liver and intrahepatic bile duct: Secondary | ICD-10-CM | POA: Diagnosis not present

## 2022-05-03 DIAGNOSIS — Z5111 Encounter for antineoplastic chemotherapy: Secondary | ICD-10-CM | POA: Diagnosis not present

## 2022-05-03 LAB — COMPREHENSIVE METABOLIC PANEL
ALT: 43 U/L (ref 0–44)
AST: 99 U/L — ABNORMAL HIGH (ref 15–41)
Albumin: 3.7 g/dL (ref 3.5–5.0)
Alkaline Phosphatase: 75 U/L (ref 38–126)
Anion gap: 5 (ref 5–15)
BUN: 44 mg/dL — ABNORMAL HIGH (ref 8–23)
CO2: 27 mmol/L (ref 22–32)
Calcium: 8.9 mg/dL (ref 8.9–10.3)
Chloride: 107 mmol/L (ref 98–111)
Creatinine, Ser: 1.4 mg/dL — ABNORMAL HIGH (ref 0.61–1.24)
GFR, Estimated: 55 mL/min — ABNORMAL LOW (ref >60)
Glucose, Bld: 147 mg/dL — ABNORMAL HIGH (ref 70–99)
Potassium: 3.5 mmol/L (ref 3.5–5.1)
Sodium: 139 mmol/L (ref 135–145)
Total Bilirubin: 0.7 mg/dL (ref 0.3–1.2)
Total Protein: 6.6 g/dL (ref 6.5–8.1)

## 2022-05-03 LAB — MAGNESIUM: Magnesium: 2.1 mg/dL (ref 1.7–2.4)

## 2022-05-03 LAB — CBC WITH DIFFERENTIAL/PLATELET
Abs Immature Granulocytes: 0.02 10*3/uL (ref 0.00–0.07)
Basophils Absolute: 0 10*3/uL (ref 0.0–0.1)
Basophils Relative: 0 %
Eosinophils Absolute: 0.2 10*3/uL (ref 0.0–0.5)
Eosinophils Relative: 4 %
HCT: 29.2 % — ABNORMAL LOW (ref 39.0–52.0)
Hemoglobin: 10 g/dL — ABNORMAL LOW (ref 13.0–17.0)
Immature Granulocytes: 0 %
Lymphocytes Relative: 23 %
Lymphs Abs: 1.1 10*3/uL (ref 0.7–4.0)
MCH: 34.2 pg — ABNORMAL HIGH (ref 26.0–34.0)
MCHC: 34.2 g/dL (ref 30.0–36.0)
MCV: 100 fL (ref 80.0–100.0)
Monocytes Absolute: 0.9 10*3/uL (ref 0.1–1.0)
Monocytes Relative: 19 %
Neutro Abs: 2.6 10*3/uL (ref 1.7–7.7)
Neutrophils Relative %: 54 %
Platelets: 186 10*3/uL (ref 150–400)
RBC: 2.92 MIL/uL — ABNORMAL LOW (ref 4.22–5.81)
RDW: 13.6 % (ref 11.5–15.5)
WBC: 4.8 10*3/uL (ref 4.0–10.5)
nRBC: 0 % (ref 0.0–0.2)

## 2022-05-03 MED ORDER — SODIUM CHLORIDE 0.9 % IV SOLN
1000.0000 mg/m2 | Freq: Once | INTRAVENOUS | Status: AC
  Administered 2022-05-03: 2470 mg via INTRAVENOUS
  Filled 2022-05-03: qty 54.44

## 2022-05-03 MED ORDER — PALONOSETRON HCL INJECTION 0.25 MG/5ML
0.2500 mg | Freq: Once | INTRAVENOUS | Status: AC
  Administered 2022-05-03: 0.25 mg via INTRAVENOUS
  Filled 2022-05-03: qty 5

## 2022-05-03 MED ORDER — HEPARIN SOD (PORK) LOCK FLUSH 100 UNIT/ML IV SOLN
500.0000 [IU] | Freq: Once | INTRAVENOUS | Status: AC | PRN
  Administered 2022-05-03: 500 [IU]

## 2022-05-03 MED ORDER — SODIUM CHLORIDE 0.9 % IV SOLN
25.0000 mg/m2 | Freq: Once | INTRAVENOUS | Status: AC
  Administered 2022-05-03: 62 mg via INTRAVENOUS
  Filled 2022-05-03: qty 62

## 2022-05-03 MED ORDER — POTASSIUM CHLORIDE IN NACL 20-0.9 MEQ/L-% IV SOLN
Freq: Once | INTRAVENOUS | Status: AC
  Filled 2022-05-03: qty 1000

## 2022-05-03 MED ORDER — SODIUM CHLORIDE 0.9 % IV SOLN
10.0000 mg | Freq: Once | INTRAVENOUS | Status: AC
  Administered 2022-05-03: 10 mg via INTRAVENOUS
  Filled 2022-05-03: qty 10

## 2022-05-03 MED ORDER — SODIUM CHLORIDE 0.9 % IV SOLN
150.0000 mg | Freq: Once | INTRAVENOUS | Status: AC
  Administered 2022-05-03: 150 mg via INTRAVENOUS
  Filled 2022-05-03: qty 5

## 2022-05-03 MED ORDER — SODIUM CHLORIDE 0.9 % IV SOLN: INTRAVENOUS | Status: DC

## 2022-05-03 MED ORDER — SODIUM CHLORIDE 0.9 % IV SOLN
1500.0000 mg | Freq: Once | INTRAVENOUS | Status: AC
  Administered 2022-05-03: 1500 mg via INTRAVENOUS
  Filled 2022-05-03: qty 30

## 2022-05-03 MED ORDER — SODIUM CHLORIDE 0.9 % IV SOLN: Freq: Once | INTRAVENOUS | Status: AC

## 2022-05-03 MED ORDER — MAGNESIUM SULFATE 2 GM/50ML IV SOLN
2.0000 g | Freq: Once | INTRAVENOUS | Status: AC
  Administered 2022-05-03: 2 g via INTRAVENOUS
  Filled 2022-05-03: qty 50

## 2022-05-03 MED ORDER — SODIUM CHLORIDE 0.9% FLUSH
10.0000 mL | INTRAVENOUS | Status: DC | PRN
  Administered 2022-05-03 (×2): 10 mL

## 2022-05-03 NOTE — Progress Notes (Signed)
Patient has been examined by Dr. Katragadda, and vital signs and labs have been reviewed. ANC, Creatinine, LFTs, hemoglobin, and platelets are within treatment parameters per M.D. - pt may proceed with treatment.  Primary RN and pharmacy notified.  

## 2022-05-03 NOTE — Patient Instructions (Signed)
[facility]  Discharge Instructions: Thank you for choosing [facility] to provide your oncology and hematology care.  If you have a lab appointment with the [facility], please come in thru the Main Entrance and check in at the main information desk.  Wear comfortable clothing and clothing appropriate for easy access to any Portacath or PICC line.   We strive to give you quality time with your provider. You may need to reschedule your appointment if you arrive late (15 or more minutes).  Arriving late affects you and other patients whose appointments are after yours.  Also, if you miss three or more appointments without notifying the office, you may be dismissed from the clinic at the provider's discretion.      For prescription refill requests, have your pharmacy contact our office and allow 72 hours for refills to be completed.    Today you received the following chemotherapy and/or immunotherapy agents Imfinzi, Gemzar, Cisplatin, return as scheduled.   To help prevent nausea and vomiting after your treatment, we encourage you to take your nausea medication as directed.  BELOW ARE SYMPTOMS THAT SHOULD BE REPORTED IMMEDIATELY: *FEVER GREATER THAN 100.4 F (38 C) OR HIGHER *CHILLS OR SWEATING *NAUSEA AND VOMITING THAT IS NOT CONTROLLED WITH YOUR NAUSEA MEDICATION *UNUSUAL SHORTNESS OF BREATH *UNUSUAL BRUISING OR BLEEDING *URINARY PROBLEMS (pain or burning when urinating, or frequent urination) *BOWEL PROBLEMS (unusual diarrhea, constipation, pain near the anus) TENDERNESS IN MOUTH AND THROAT WITH OR WITHOUT PRESENCE OF ULCERS (sore throat, sores in mouth, or a toothache) UNUSUAL RASH, SWELLING OR PAIN  UNUSUAL VAGINAL DISCHARGE OR ITCHING   Items with * indicate a potential emergency and should be followed up as soon as possible or go to the Emergency Department if any problems should occur.  Please show the CHEMOTHERAPY ALERT CARD or IMMUNOTHERAPY  ALERT CARD at check-in to the Emergency Department and triage nurse.  Should you have questions after your visit or need to cancel or reschedule your appointment, please contact [facility] [phone]  and follow the prompts.  Office hours are 8:00 a.m. to 4:30 p.m. Monday - Friday. Please note that voicemails left after 4:00 p.m. may not be returned until the following business day.  We are closed weekends and major holidays. You have access to a nurse at all times for urgent questions. Please call the main number to the clinic [phone] and follow the prompts.  For any non-urgent questions, you may also contact your provider using MyChart. We now offer e-Visits for anyone 33 and older to request care online for non-urgent symptoms. For details visit [email or web address].   Also download the MyChart app! Go to the app store, search "MyChart", open the app, select [facility], and log in with your MyChart username and password.  Masks are optional in the cancer centers. If you would like for your care team to wear a mask while they are taking care of you, please let them know. You may have one support person who is at least 67 years old accompany you for your appointments.

## 2022-05-03 NOTE — Progress Notes (Signed)
Patient and labs assessed by Dr. Delton Coombes, patient okay for treatment today with and additional 563m NS bolus added to plan per orders.  Patient tolerated chemotherapy with no complaints voiced. Side effects with management reviewed understanding verbalized. Port site clean and dry with no bruising or swelling noted at site. Good blood return noted before and after administration of chemotherapy. Band aid applied. Patient left in satisfactory condition with VSS and no s/s of distress noted.

## 2022-05-03 NOTE — Patient Instructions (Signed)
[facility] at [facility] Discharge Instructions   You were seen and examined today by Dr. Delton Coombes.  He reviewed the results of your lab work which are mostly normal/stable. Your creatinine (kidney function number) is elevated. We will give you extra fluids in the clinic today. Try to increase your water intake at home.  We will proceed with your treatment today.  We will repeat a CT scan prior to your next office visit.   Return as scheduled.    Thank you for choosing [facility] at [facility] to provide your oncology and hematology care.  To afford each patient quality time with our provider, please arrive at least 15 minutes before your scheduled appointment time.   If you have a lab appointment with the [facility] please come in thru the Main Entrance and check in at the main information desk.  You need to re-schedule your appointment should you arrive 10 or more minutes late.  We strive to give you quality time with our providers, and arriving late affects you and other patients whose appointments are after yours.  Also, if you no show three or more times for appointments you may be dismissed from the clinic at the providers discretion.     Again, thank you for choosing [facility].  Our hope is that these requests will decrease the amount of time that you wait before being seen by our physicians.       _____________________________________________________________  Should you have questions after your visit to [facility], please contact our office at [phone] and follow the prompts.  Our office hours are 8:00 a.m. and 4:30 p.m. Monday - Friday.  Please note that voicemails left after 4:00 p.m. may not be returned until the following business day.  We are closed weekends and major holidays.  You do have access to a nurse 24-7, just call the main number to the clinic [phone] and do not press any  options, hold on the line and a nurse will answer the phone.    For prescription refill requests, have your pharmacy contact our office and allow 72 hours.    Due to Covid, you will need to wear a mask upon entering the hospital. If you do not have a mask, a mask will be given to you at the Main Entrance upon arrival. For doctor visits, patients may have 1 support person age 49 or older with them. For treatment visits, patients can not have anyone with them due to social distancing guidelines and our immunocompromised population.

## 2022-05-03 NOTE — Progress Notes (Signed)
[facility] [address], [place] [address]   CLINIC:  Medical Oncology/Hematology  PCP:  Celene Squibb, MD [address] 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  PRIOR THERAPY: none  NGS Results: No targetable mutations.  PD-L1 (TD428) negative.  MSI-stable.  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, a 66 y.o. male, seen for cholangiocarcinoma next cycle of chemotherapy and toxicity assessment.  He is receiving gemcitabine, cisplatin and and Imfinzi.  He reports tingling on and off in the toes mostly in the evenings.  No infections or fevers.  Appetite is good.  No nausea or vomiting.  No abdominal pains.  REVIEW OF SYSTEMS:  Review of Systems  Constitutional:  Negative for appetite change, fatigue and unexpected weight change.  HENT:   Negative for mouth sores (healed) and tinnitus.   Respiratory:  Positive for shortness of breath.   Neurological:  Positive for dizziness and numbness (stable).  Psychiatric/Behavioral:  Negative for sleep disturbance.   All other systems reviewed and are negative.   PAST MEDICAL/SURGICAL HISTORY:  Past Medical History:  Diagnosis Date   Anxiety    Bladder cancer ([facility]) 09/11/2009   CAD (coronary artery disease), native coronary artery     a. Mildly elevated troponin 03/2013, cath with nonobstructive disease including 50% AV groove distal stenosis before large OM   Essential hypertension    Headache(784.0)    History of migraines   Hyperglycemia    Mixed hyperlipidemia    Neuropathy    Obesity    Port-A-Cath in place 09/30/2021   Pre-diabetes    Seasonal allergies    Sleep apnea    On CPAP   Past Surgical History:  Procedure Laterality Date   BIOPSY  09/23/2021   Procedure: BIOPSY;  Surgeon: Harvel Quale, MD;  [facility];  Service: Gastroenterology;;   COLONOSCOPY  06/28/2011   Procedure: COLONOSCOPY;  Surgeon: Rogene Houston, MD;  Location: [facility];  Service: Endoscopy;  Laterality: N/A;   COLONOSCOPY WITH PROPOFOL N/A 09/23/2021   Procedure: COLONOSCOPY WITH PROPOFOL;  Surgeon: Harvel Quale, MD;  Location: [facility];  Service: Gastroenterology;  Laterality: N/A;  940   ESOPHAGOGASTRODUODENOSCOPY (EGD) WITH PROPOFOL N/A 09/23/2021   Procedure: ESOPHAGOGASTRODUODENOSCOPY (EGD) WITH PROPOFOL;  Surgeon: Harvel Quale, MD;  Location: [facility];  Service: Gastroenterology;  Laterality: N/A;   IR IMAGING GUIDED PORT INSERTION  09/28/2021   IR PARACENTESIS  09/28/2021   JOINT REPLACEMENT Right    hip   LEFT HEART CATHETERIZATION WITH CORONARY ANGIOGRAM N/A 03/31/2013   Procedure: LEFT HEART CATHETERIZATION WITH CORONARY ANGIOGRAM;  Surgeon: Minus Breeding, MD;  Location[facility] CATH LAB;  Service: Cardiovascular;  Laterality: N/A;   POLYPECTOMY  09/23/2021   Procedure: POLYPECTOMY;  Surgeon: Harvel Quale, MD;  Location: [facility]  ENDO [facility];  Service: Gastroenterology;;   TURBT  09/11/2009    SOCIAL HISTORY:  Social History   Socioeconomic History   Marital status: Divorced    Spouse name: Not on file   Number of children: 0   Years of education: college   Highest education level: Not on file  Occupational History    Employer: SELF-EMPLOYED  Tobacco Use    Smoking status: Former    Packs/day: 0.50    Years: 10.00    Total pack years: 5.00    Types: Cigarettes    Quit date: 07/10/2011    Years since quitting: 10.8   Smokeless tobacco: Never   Tobacco comments:    Quit several yrs prior to 03/2013.  Vaping Use   Vaping Use: Never used  Substance and Sexual Activity   Alcohol use: Yes    Alcohol/week: 0.0 standard drinks of alcohol    Comment: Occasional   Drug use: No   Sexual activity: Not on file  Other Topics Concern   Not on file  Social History Narrative   Not on file   Social Determinants of Health   Financial Resource Strain: Not on file  Food Insecurity: Not on file  Transportation Needs: Not on file  Physical Activity: Not on file  Stress: Not on file  Social Connections: Not on file  Intimate Partner Violence: Not on file    FAMILY HISTORY:  Family History  Problem Relation Age of Onset   COPD Father     CURRENT MEDICATIONS:  Current Outpatient Medications  Medication Sig Dispense Refill   amLODipine (NORVASC) 5 MG tablet Take 5 mg by mouth daily.     aspirin EC 81 MG tablet Take 81 mg by mouth daily.     buPROPion (WELLBUTRIN XL) 300 MG 24 hr tablet Take 300 mg by mouth daily.     CISPLATIN IV Inject into the vein once a week. Day 1, Day 8 q 21 days Every other week.     cyclobenzaprine (FLEXERIL) 10 MG tablet Take 10 mg by mouth at bedtime as needed for muscle spasms.     Durvalumab (IMFINZI IV) Inject into the vein every 21 ( twenty-one) days.     Eszopiclone 3 MG TABS Take 3 mg by mouth at bedtime. Take immediately before bedtime     famotidine (PEPCID) 20 MG tablet TAKE (1) TABLET BY MOUTH AT BEDTIME. 30 tablet 0   fenofibrate 160 MG tablet Take 160 mg by mouth daily.     FLUoxetine (PROZAC) 10 MG capsule Take 10 mg by mouth daily.     Gemcitabine HCl (GEMZAR IV) Inject into the vein once a week. Day 1, Day 8 q 21 days Every other week     lidocaine (XYLOCAINE) 2 % solution 5 mLs 4 (four) times daily  as needed.     magnesium oxide (MAG-OX) 400 (240 Mg) MG tablet Take 1 tablet (400 mg total) by mouth 2 (two) times daily. (Patient taking differently: Take 400 mg by mouth 3 (three) times daily.) 90 tablet 6   metFORMIN (GLUCOPHAGE-XR) 500 MG 24 hr tablet Take 500 mg by mouth 2 (two) times daily.     nitroGLYCERIN (NITROSTAT) 0.4 MG SL tablet Place 1 tablet (0.4 mg total) under the tongue every 5 (five) minutes as needed for chest pain. 25 tablet 3   olmesartan (BENICAR) 40 MG tablet Take 1 tablet (40 mg total) by mouth daily. 30 tablet 6   omeprazole (PRILOSEC) 40 MG capsule Take  40 mg by mouth daily.     pantoprazole (PROTONIX) 40 MG tablet Take 1 tablet (40 mg total) by mouth daily. 90 tablet 3   pramipexole (MIRAPEX) 0.75 MG tablet Take 1 tablet (0.75 mg total) by mouth 3 (three) times daily. 90 tablet 4   prochlorperazine (COMPAZINE) 10 MG tablet Take 1 tablet (10 mg total) by mouth every 6 (six) hours as needed (Nausea or vomiting). 30 tablet 1   RYBELSUS 7 MG TABS Take 14 mg by mouth daily.     sucralfate (CARAFATE) 1 GM/10ML suspension Take 10 mLs (1 g total) by mouth 4 (four) times daily. Swish and swallow 4 times daily as needed for mouth and throat irritation 420 mL 0   tamsulosin (FLOMAX) 0.4 MG CAPS capsule Take 0.4 mg by mouth daily.     traMADol (ULTRAM) 50 MG tablet Take 50 mg by mouth 4 (four) times daily as needed.     No current facility-administered medications for this visit.   Facility-Administered Medications Ordered in Other Visits  Medication Dose Route Frequency Provider Last Rate Last Admin   magnesium sulfate 2 GM/50ML IVPB             ALLERGIES:  No Known Allergies  PHYSICAL EXAM:  Performance status (ECOG): 0 - Asymptomatic  There were no vitals filed for this visit. Wt Readings from Last 3 Encounters:  05/03/22 267 lb 1.6 oz (121.2 kg)  04/19/22 269 lb (122 kg)  04/05/22 266 lb 14.4 oz (121.1 kg)   Physical Exam Vitals reviewed.  Constitutional:       Appearance: Normal appearance.  Cardiovascular:     Rate and Rhythm: Normal rate and regular rhythm.     Pulses: Normal pulses.     Heart sounds: Normal heart sounds.  Pulmonary:     Effort: Pulmonary effort is normal.     Breath sounds: Normal breath sounds.  Neurological:     General: No focal deficit present.     Mental Status: He is alert and oriented to person, place, and time.  Psychiatric:        Mood and Affect: Mood normal.        Behavior: Behavior normal.    LABORATORY DATA:  I have reviewed the labs as listed.     Latest Ref Rng & Units 05/03/2022    8:14 AM 04/19/2022    8:14 AM 04/05/2022    8:07 AM  CBC  WBC 4.0 - 10.5 K/uL 4.8  4.5  3.8   Hemoglobin 13.0 - 17.0 g/dL 10.0  10.6  11.0   Hematocrit 39.0 - 52.0 % 29.2  30.9  31.7   Platelets 150 - 400 K/uL 186  159  161       Latest Ref Rng & Units 04/19/2022    8:14 AM 04/05/2022    8:07 AM 03/22/2022    8:03 AM  CMP  Glucose 70 - 99 mg/dL 98  122  124   BUN 8 - 23 mg/dL 35  30  29   Creatinine 0.61 - 1.24 mg/dL 1.32  1.16  1.14   Sodium 135 - 145 mmol/L 134  137  138   Potassium 3.5 - 5.1 mmol/L 3.9  4.2  4.3   Chloride 98 - 111 mmol/L 101  103  106   CO2 22 - 32 mmol/L _0 Calcium 8.9 - 10.3 mg/dL 9.0  9.2  9.1   Total Protein 6.5 - 8.1 g/dL 6.5  6.4  6.2   Total Bilirubin 0.3 - 1.2 mg/dL 0.7  0.6  0.5   Alkaline Phos 38 - 126 U/L 57  59  77   AST 15 - 41 U/L _0 ALT 0 - 44 U/L _1 DIAGNOSTIC IMAGING:  I have independently reviewed the scans and discussed with the patient. No results found.   ASSESSMENT:  Liver lesion/peritoneal carcinomatosis: - Patient seen at the request of Dr. Delphina Cahill - Reported pressure on the sides of the abdomen with slight pain for the last 1 month.  He also reported pain in the epigastric region. - He had lost 20 pounds in the last 3 to 4 months intentionally, cutting back on sugars.  He was also started on semaglutide. - Colonoscopy on  06/28/2011 with a benign polypoid colonic mucosa in the descending colon.  No evidence of malignancy. - MRI of the brain was negative. - EGD and colonoscopy on 09/23/2018 did not reveal any malignancies. 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  IHC positive for CK7, CDX2 and negative for GATA3.  Findings suggestive of upper GI or pancreaticobiliary primary. - Cycle 1 of gemcitabine, cisplatin and durvalumab started on 10/05/2021. - NGS: No targetable mutations.  PD-L1 (YY482) negative.  MSI-stable.  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   Social/family history: - He lives at home by himself.  He records voiceover/narrations. - Non-smoker. - Father died of MDS.  Paternal grandfather had prostate cancer.  Maternal grandfather had cancer.  3.  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 Alinda Money.  Reportedly received 1 treatment of intravesical chemo and has been on surveillance since then.  Last surveillance visit was in 2020.   PLAN:  Cholangiocarcinoma with peritoneal carcinomatosis: - CT CAP (03/04/2022): Minimal new perihilar reticular and tree-in-bud opacities in the left upper lobe inflammatory/infectious.  Left lower lobe lesion decreased in size. - He did not notice any worsening shortness of breath or dry cough after last treatment with immunotherapy. - I have reviewed his labs.  AST is 99.  Creatinine is elevated at 1.4. - Last CEA was 2.9. - Proceed with next cycle of chemotherapy.  He will also receive 1 L of fluid with electrolytes.  RTC 4 weeks for follow-up.  We will plan to do CT CAP, CEA along with routine labs at that time.  If the scans are stable or improved, continue the same treatment.   Hypertension: - Continue Benicar and amlodipine.  Blood pressure is 132/63.  3.  Neuropathy/"drawing" of legs: - Continue Mirapex 0.75 mg 3 times daily.  4.  Sleeping difficulty: - Can continue Lunesta as  needed.  5.  Hypomagnesemia: - New magnesium 3 times daily.  Magnesium is 2.1.   Orders placed this encounter:  No orders of the defined types were placed in this encounter.    Derek Jack, MD [facility] [phone]

## 2022-05-16 ENCOUNTER — Other Ambulatory Visit: Payer: Self-pay | Admitting: Hematology

## 2022-05-16 DIAGNOSIS — C221 Intrahepatic bile duct carcinoma: Secondary | ICD-10-CM

## 2022-05-16 DIAGNOSIS — Z95828 Presence of other vascular implants and grafts: Secondary | ICD-10-CM

## 2022-05-17 ENCOUNTER — Inpatient Hospital Stay: Payer: Medicare Other | Attending: Hematology

## 2022-05-17 ENCOUNTER — Inpatient Hospital Stay: Payer: Medicare Other

## 2022-05-17 ENCOUNTER — Other Ambulatory Visit (HOSPITAL_COMMUNITY): Payer: Self-pay | Admitting: Physician Assistant

## 2022-05-17 DIAGNOSIS — C221 Intrahepatic bile duct carcinoma: Secondary | ICD-10-CM | POA: Insufficient documentation

## 2022-05-17 DIAGNOSIS — Z8551 Personal history of malignant neoplasm of bladder: Secondary | ICD-10-CM | POA: Diagnosis not present

## 2022-05-17 DIAGNOSIS — M7989 Other specified soft tissue disorders: Secondary | ICD-10-CM | POA: Diagnosis not present

## 2022-05-17 DIAGNOSIS — Z95828 Presence of other vascular implants and grafts: Secondary | ICD-10-CM

## 2022-05-17 DIAGNOSIS — Z5111 Encounter for antineoplastic chemotherapy: Secondary | ICD-10-CM | POA: Diagnosis not present

## 2022-05-17 DIAGNOSIS — Z808 Family history of malignant neoplasm of other organs or systems: Secondary | ICD-10-CM | POA: Diagnosis not present

## 2022-05-17 DIAGNOSIS — Z8042 Family history of malignant neoplasm of prostate: Secondary | ICD-10-CM | POA: Insufficient documentation

## 2022-05-17 DIAGNOSIS — Z8509 Personal history of malignant neoplasm of other digestive organs: Secondary | ICD-10-CM

## 2022-05-17 DIAGNOSIS — Z87891 Personal history of nicotine dependence: Secondary | ICD-10-CM | POA: Diagnosis not present

## 2022-05-17 DIAGNOSIS — Z809 Family history of malignant neoplasm, unspecified: Secondary | ICD-10-CM | POA: Insufficient documentation

## 2022-05-17 DIAGNOSIS — Z79899 Other long term (current) drug therapy: Secondary | ICD-10-CM | POA: Diagnosis not present

## 2022-05-17 DIAGNOSIS — C786 Secondary malignant neoplasm of retroperitoneum and peritoneum: Secondary | ICD-10-CM | POA: Diagnosis not present

## 2022-05-17 DIAGNOSIS — G629 Polyneuropathy, unspecified: Secondary | ICD-10-CM | POA: Insufficient documentation

## 2022-05-17 DIAGNOSIS — I10 Essential (primary) hypertension: Secondary | ICD-10-CM | POA: Insufficient documentation

## 2022-05-17 DIAGNOSIS — Z452 Encounter for adjustment and management of vascular access device: Secondary | ICD-10-CM | POA: Diagnosis not present

## 2022-05-17 DIAGNOSIS — R059 Cough, unspecified: Secondary | ICD-10-CM | POA: Diagnosis not present

## 2022-05-17 DIAGNOSIS — K30 Functional dyspepsia: Secondary | ICD-10-CM

## 2022-05-17 LAB — COMPREHENSIVE METABOLIC PANEL
ALT: 30 U/L (ref 0–44)
AST: 29 U/L (ref 15–41)
Albumin: 3.5 g/dL (ref 3.5–5.0)
Alkaline Phosphatase: 63 U/L (ref 38–126)
Anion gap: 5 (ref 5–15)
BUN: 28 mg/dL — ABNORMAL HIGH (ref 8–23)
CO2: 27 mmol/L (ref 22–32)
Calcium: 8.9 mg/dL (ref 8.9–10.3)
Chloride: 107 mmol/L (ref 98–111)
Creatinine, Ser: 1.11 mg/dL (ref 0.61–1.24)
GFR, Estimated: >60 mL/min (ref >60)
Glucose, Bld: 102 mg/dL — ABNORMAL HIGH (ref 70–99)
Potassium: 3.7 mmol/L (ref 3.5–5.1)
Sodium: 139 mmol/L (ref 135–145)
Total Bilirubin: 0.8 mg/dL (ref 0.3–1.2)
Total Protein: 5.9 g/dL — ABNORMAL LOW (ref 6.5–8.1)

## 2022-05-17 LAB — CBC WITH DIFFERENTIAL/PLATELET
Abs Immature Granulocytes: 0.01 10*3/uL (ref 0.00–0.07)
Basophils Absolute: 0 10*3/uL (ref 0.0–0.1)
Basophils Relative: 1 %
Eosinophils Absolute: 0.1 10*3/uL (ref 0.0–0.5)
Eosinophils Relative: 3 %
HCT: 28.8 % — ABNORMAL LOW (ref 39.0–52.0)
Hemoglobin: 9.6 g/dL — ABNORMAL LOW (ref 13.0–17.0)
Immature Granulocytes: 0 %
Lymphocytes Relative: 22 %
Lymphs Abs: 0.9 10*3/uL (ref 0.7–4.0)
MCH: 33.8 pg (ref 26.0–34.0)
MCHC: 33.3 g/dL (ref 30.0–36.0)
MCV: 101.4 fL — ABNORMAL HIGH (ref 80.0–100.0)
Monocytes Absolute: 0.8 10*3/uL (ref 0.1–1.0)
Monocytes Relative: 20 %
Neutro Abs: 2.2 10*3/uL (ref 1.7–7.7)
Neutrophils Relative %: 54 %
Platelets: 160 10*3/uL (ref 150–400)
RBC: 2.84 MIL/uL — ABNORMAL LOW (ref 4.22–5.81)
RDW: 14.6 % (ref 11.5–15.5)
WBC: 4 10*3/uL (ref 4.0–10.5)
nRBC: 0 % (ref 0.0–0.2)

## 2022-05-17 LAB — MAGNESIUM: Magnesium: 1.9 mg/dL (ref 1.7–2.4)

## 2022-05-17 LAB — TSH: TSH: 2.289 u[IU]/mL (ref 0.350–4.500)

## 2022-05-17 MED ORDER — HEPARIN SOD (PORK) LOCK FLUSH 100 UNIT/ML IV SOLN
500.0000 [IU] | Freq: Once | INTRAVENOUS | Status: AC
  Administered 2022-05-17: 500 [IU] via INTRAVENOUS

## 2022-05-17 MED ORDER — FUROSEMIDE 40 MG PO TABS: 40.0000 mg | ORAL_TABLET | Freq: Every day | ORAL | 1 refills | Status: DC

## 2022-05-17 MED ORDER — SODIUM CHLORIDE 0.9% FLUSH
10.0000 mL | Freq: Once | INTRAVENOUS | Status: AC
  Administered 2022-05-17: 10 mL via INTRAVENOUS

## 2022-05-17 NOTE — Patient Instructions (Signed)
[facility]  Discharge Instructions: Thank you for choosing [facility] to provide your oncology and hematology care.  If you have a lab appointment with the [facility], please come in thru the Main Entrance and check in at the main information desk.  Wear comfortable clothing and clothing appropriate for easy access to any Portacath or PICC line.   We strive to give you quality time with your provider. You may need to reschedule your appointment if you arrive late (15 or more minutes).  Arriving late affects you and other patients whose appointments are after yours.  Also, if you miss three or more appointments without notifying the office, you may be dismissed from the clinic at the provider's discretion.      For prescription refill requests, have your pharmacy contact our office and allow 72 hours for refills to be completed.    Today your treatment was held due to edema, 40 mg of Lasix was called into your pharmacy. Return as scheduled.   To help prevent nausea and vomiting after your treatment, we encourage you to take your nausea medication as directed.  BELOW ARE SYMPTOMS THAT SHOULD BE REPORTED IMMEDIATELY: *FEVER GREATER THAN 100.4 F (38 C) OR HIGHER *CHILLS OR SWEATING *NAUSEA AND VOMITING THAT IS NOT CONTROLLED WITH YOUR NAUSEA MEDICATION *UNUSUAL SHORTNESS OF BREATH *UNUSUAL BRUISING OR BLEEDING *URINARY PROBLEMS (pain or burning when urinating, or frequent urination) *BOWEL PROBLEMS (unusual diarrhea, constipation, pain near the anus) TENDERNESS IN MOUTH AND THROAT WITH OR WITHOUT PRESENCE OF ULCERS (sore throat, sores in mouth, or a toothache) UNUSUAL RASH, SWELLING OR PAIN  UNUSUAL VAGINAL DISCHARGE OR ITCHING   Items with * indicate a potential emergency and should be followed up as soon as possible or go to the Emergency Department if any problems should occur.  Please show the CHEMOTHERAPY ALERT CARD or IMMUNOTHERAPY ALERT CARD at  check-in to the Emergency Department and triage nurse.  Should you have questions after your visit or need to cancel or reschedule your appointment, please contact [facility] [phone]  and follow the prompts.  Office hours are 8:00 a.m. to 4:30 p.m. Monday - Friday. Please note that voicemails left after 4:00 p.m. may not be returned until the following business day.  We are closed weekends and major holidays. You have access to a nurse at all times for urgent questions. Please call the main number to the clinic [phone] and follow the prompts.  For any non-urgent questions, you may also contact your provider using MyChart. We now offer e-Visits for anyone 75 and older to request care online for non-urgent symptoms. For details visit [email or web address].   Also download the MyChart app! Go to the app store, search "MyChart", open the app, select [facility], and log in with your MyChart username and password.  Masks are optional in the cancer centers. If you would like for your care team to wear a mask while they are taking care of you, please let them know. You may have one support person who is at least 67 years old accompany you for your appointments.

## 2022-05-17 NOTE — Progress Notes (Signed)
Patient presents today for possible treatment, patient complains of leg swelling, and pain in his legs, "feels like his skin is burning". He has bilateral pitting edema, he also has gained 7lbs in 2 weeks. He states the swelling started near the end of last week. Patient reports urine output is less than normal with normal hydration. Dr. Delton Coombes made aware, per Dr. Delton Coombes no treatment today, '40mg'$  of Lasix called into patient's pharmacy. Patient will return to clinic on Friday for labs and appointment with PA. Treatment will resume on 9/20 with labs and Doctor's appointment on 9/19. Patient made aware and verbalizes understanding. Port flushed with good blood return noted. No bruising or swelling at site. Bandaid applied and patient discharged in satisfactory condition. VVS stable with no signs or symptoms of distressed noted.

## 2022-05-18 LAB — CEA: CEA: 2.7 ng/mL (ref 0.0–4.7)

## 2022-05-18 NOTE — Progress Notes (Signed)
[facility] [address], [place] [address] Phone: [phone] Fax: [facility] PROGRESS NOTE   Marvin Carr [id number] 1956-04-05 66 y.o.  Marvin Carr is managed by Dr. 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    Actively treated with chemotherapy/immunotherapy/hormonal therapy: YES  Current therapy: Cisplatin + gemcitabine + Imfinzi D1,8, q. 21 days  Last treated: 05/03/2022  Next scheduled appointment with provider: 05/30/2022  Subjective:  Chief Complaint: Bilateral leg edema  Marvin Carr is managed by Dr. 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    Per nurse note from 05/17/2022, patient complained of leg swelling, leg pain, and "feeling like his skin was burning" for the past week.  Weight from 05/17/2022 showed that he had gained 7 pounds in two weeks.  Patient was started on 40 mg Lasix daily as of 05/17/2022.  Treatment was held.  During my evaluation with patient, he reports that he had noticed leg swelling about 1 week ago.  Legs have been spontaneously oozing serous fluid.  Patient does not elevate legs at home.  He has had some slight improvement in his edema since starting Lasix, and weight is down 2 pounds in the past 2 days.  Edema localized to lower extremities distal to the knees, with right lower extremity more swollen than left lower extremity.  He denies any scrotal edema or abdominal swelling.  No shortness of breath or chest pain.  He reports normal urine output, which has also increased from Lasix.  He reports increased neuropathic pain in his legs.  Patient also reports sinus congestion, postnasal drip, and mild productive cough for the past day.  No associated fevers or chills.  Review of Systems:  Review of Systems  Constitutional:  Negative for activity change, appetite  change, chills, diaphoresis, fatigue, fever and unexpected weight change.  HENT:  Positive for congestion. Negative for mouth sores, nosebleeds, sore throat and trouble swallowing.   Respiratory:  Positive for cough and shortness of breath.   Cardiovascular:  Positive for leg swelling. Negative for chest pain and palpitations.  Gastrointestinal:  Negative for abdominal pain, blood in stool, constipation, diarrhea, nausea and vomiting.  Genitourinary:  Negative for dysuria and hematuria.  Neurological:  Positive for dizziness (Positional vertigo) and numbness (Burning pain in bilateral legs). Negative for light-headedness and headaches.  Psychiatric/Behavioral:  Negative for dysphoric mood and sleep disturbance. The patient is not nervous/anxious.      Past Medical History, Surgical history, Social history, and Family history were reviewed as documented elsewhere in chart, and were updated as appropriate.   Assessment & Plan:    1.  Lymphedema - Per nurse note from 05/17/2022, patient complained of leg swelling, leg pain, and "feeling like his skin was burning" for the past week.   - Weight from 05/17/2022 showed that he had gained 7 pounds in two weeks. - Patient was started on 40 mg Lasix daily as of 05/17/2022.  Reports slight improvement in edema since starting Lasix, has lost 2 pounds since starting Lasix. - Edema localized to lower extremities distal to the knees, RLE (49 cm) >LLE (40 cm) - No scrotal edema, abdominal swelling, abdominal pain. - No shortness of breath or chest pain. - Lungs clear to auscultation on exam. - Reports normal urine output. - Patient has known liver metastases from his cholangiocarcinoma, last CT performed 03/04/2022 - Creatinine today 1.41, slightly up from previous but similar to renal function  1 month ago - PLAN: Suspect lymphedema secondary to abdominal malignancy. - Prescription for Lasix 40 mg daily was sent to pharmacy by Dr. Delton Coombes on 05/17/2022.  We will  cautiously continue Lasix for the time being, but advised patient to stop Lasix if he does not notice any improvement in his peripheral edema, since diuretics have questionable efficacy in improving lymphedema secondary to malignancy and may cause dehydration and kidney injury. - Low suspicion for DVT, but due to underlying malignancy and right leg circumference 9 cm > left leg, we will check venous duplex ultrasound. - Patient is scheduled for CT scan of abdomen/pelvis next week to assess tumor burden and treatment response, will also give important information regarding cause of lymphedema (hepatic tumors, lymphatic obstruction, etc.) - Provided with elastic compression bandages and absorbent pads with prescription for additional supplies sent to pharmacy.  Educated on technique by RN. - Advised to keep skin clean, keep legs elevated, avoid tight clothing, and avoid standing for long periods of time.  2.  Neuropathic pain of bilateral legs - Patient is taking gabapentin at home with some relief - We discussed adding the Cymbalta, but since this interacts with his other medications we will hold off on it at this time - PLAN: Continue as needed gabapentin.  Notify our office if additional refill or increased strength is needed.  3.  Nasal congestion - Nasal congestion with mild productive cough x1 day - No fever or chills - Lungs clear to auscultation bilaterally, vitals within normal limits - PLAN: No indication for antibiotic at this time.  Advised to contact our office if symptoms worsen or he develops fever.  4.  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 Delton Coombes - PLAN: Scheduled for next office visit (NP Burns Spain) and treatment on 05/30/2022 and 05/31/2022   Objective:   Physical Exam:  There were no vitals taken for this visit. ECOG: 1  Physical Exam Constitutional:      Appearance: Normal appearance. He is  obese.  HENT:     Head: Normocephalic and atraumatic.     Mouth/Throat:     Mouth: Mucous membranes are moist.  Eyes:     Extraocular Movements: Extraocular movements intact.     Pupils: Pupils are equal, round, and reactive to light.  Cardiovascular:     Rate and Rhythm: Normal rate and regular rhythm.     Pulses: Normal pulses.     Heart sounds: Normal heart sounds.  Pulmonary:     Effort: Pulmonary effort is normal.     Breath sounds: Normal breath sounds.  Abdominal:     General: Bowel sounds are normal.     Palpations: Abdomen is soft.     Tenderness: There is no abdominal tenderness.  Musculoskeletal:        General: No swelling.     Right lower leg: Edema (4+ pitting edema, leaking serous fluid, 49 cm circumference) present.     Left lower leg: Edema (3+ pitting edema, leaking serous fluid, 40 cm circumference) present.  Lymphadenopathy:     Cervical: No cervical adenopathy.  Skin:    General: Skin is warm and dry.  Neurological:     General: No focal deficit present.     Mental Status: He is alert and oriented to person, place, and time.  Psychiatric:        Mood and Affect: Mood normal.        Behavior: Behavior normal.  Lab Review:     Component Value Date/Time   NA 139 05/17/2022 0749   K 3.7 05/17/2022 0749   CL 107 05/17/2022 0749   CO2 27 05/17/2022 0749   GLUCOSE 102 (H) 05/17/2022 0749   BUN 28 (H) 05/17/2022 0749   CREATININE 1.11 05/17/2022 0749   CALCIUM 8.9 05/17/2022 0749   PROT 5.9 (L) 05/17/2022 0749   ALBUMIN 3.5 05/17/2022 0749   AST 29 05/17/2022 0749   ALT 30 05/17/2022 0749   ALKPHOS 63 05/17/2022 0749   BILITOT 0.8 05/17/2022 0749   GFRNONAA >60 05/17/2022 0749   GFRAA >90 06/11/2014 0558       Component Value Date/Time   WBC 4.0 05/17/2022 0749   RBC 2.84 (L) 05/17/2022 0749   HGB 9.6 (L) 05/17/2022 0749   HCT 28.8 (L) 05/17/2022 0749   PLT 160 05/17/2022 0749   MCV 101.4 (H) 05/17/2022 0749   MCH 33.8 05/17/2022 0749    MCHC 33.3 05/17/2022 0749   RDW 14.6 05/17/2022 0749   LYMPHSABS 0.9 05/17/2022 0749   MONOABS 0.8 05/17/2022 0749   EOSABS 0.1 05/17/2022 0749   BASOSABS 0.0 05/17/2022 0749   -------------------------------  Imaging from last 24 hours (if applicable):  Radiology interpretation: No results found.    Wrap-Up:    All questions were answered. The patient knows to call the clinic with any problems, questions or concerns.  Medical decision making: Moderate  Time spent on visit: I spent 30 minutes counseling the patient face to face. The total time spent in the appointment was 55 minutes and more than 50% was on counseling.   Harriett Rush, PA-C  05/19/2022 6:20 PM

## 2022-05-19 ENCOUNTER — Inpatient Hospital Stay: Payer: Medicare Other

## 2022-05-19 ENCOUNTER — Inpatient Hospital Stay (HOSPITAL_BASED_OUTPATIENT_CLINIC_OR_DEPARTMENT_OTHER): Payer: Medicare Other | Admitting: Physician Assistant

## 2022-05-19 ENCOUNTER — Ambulatory Visit: Payer: Medicare Other

## 2022-05-19 DIAGNOSIS — C801 Malignant (primary) neoplasm, unspecified: Secondary | ICD-10-CM

## 2022-05-19 DIAGNOSIS — Z5111 Encounter for antineoplastic chemotherapy: Secondary | ICD-10-CM | POA: Diagnosis not present

## 2022-05-19 DIAGNOSIS — G63 Polyneuropathy in diseases classified elsewhere: Secondary | ICD-10-CM

## 2022-05-19 DIAGNOSIS — C221 Intrahepatic bile duct carcinoma: Secondary | ICD-10-CM

## 2022-05-19 DIAGNOSIS — I890 Lymphedema, not elsewhere classified: Secondary | ICD-10-CM

## 2022-05-19 DIAGNOSIS — C787 Secondary malignant neoplasm of liver and intrahepatic bile duct: Secondary | ICD-10-CM

## 2022-05-19 DIAGNOSIS — Z95828 Presence of other vascular implants and grafts: Secondary | ICD-10-CM

## 2022-05-19 LAB — CBC WITH DIFFERENTIAL/PLATELET
Abs Immature Granulocytes: 0.01 10*3/uL (ref 0.00–0.07)
Basophils Absolute: 0 10*3/uL (ref 0.0–0.1)
Basophils Relative: 1 %
Eosinophils Absolute: 0.1 10*3/uL (ref 0.0–0.5)
Eosinophils Relative: 2 %
HCT: 28.9 % — ABNORMAL LOW (ref 39.0–52.0)
Hemoglobin: 9.9 g/dL — ABNORMAL LOW (ref 13.0–17.0)
Immature Granulocytes: 0 %
Lymphocytes Relative: 23 %
Lymphs Abs: 0.9 10*3/uL (ref 0.7–4.0)
MCH: 34.6 pg — ABNORMAL HIGH (ref 26.0–34.0)
MCHC: 34.3 g/dL (ref 30.0–36.0)
MCV: 101 fL — ABNORMAL HIGH (ref 80.0–100.0)
Monocytes Absolute: 0.8 10*3/uL (ref 0.1–1.0)
Monocytes Relative: 19 %
Neutro Abs: 2.1 10*3/uL (ref 1.7–7.7)
Neutrophils Relative %: 55 %
Platelets: 182 10*3/uL (ref 150–400)
RBC: 2.86 MIL/uL — ABNORMAL LOW (ref 4.22–5.81)
RDW: 14.6 % (ref 11.5–15.5)
WBC: 3.9 10*3/uL — ABNORMAL LOW (ref 4.0–10.5)
nRBC: 0 % (ref 0.0–0.2)

## 2022-05-19 LAB — COMPREHENSIVE METABOLIC PANEL
ALT: 26 U/L (ref 0–44)
AST: 26 U/L (ref 15–41)
Albumin: 3.5 g/dL (ref 3.5–5.0)
Alkaline Phosphatase: 73 U/L (ref 38–126)
Anion gap: 9 (ref 5–15)
BUN: 32 mg/dL — ABNORMAL HIGH (ref 8–23)
CO2: 25 mmol/L (ref 22–32)
Calcium: 9 mg/dL (ref 8.9–10.3)
Chloride: 105 mmol/L (ref 98–111)
Creatinine, Ser: 1.41 mg/dL — ABNORMAL HIGH (ref 0.61–1.24)
GFR, Estimated: 55 mL/min — ABNORMAL LOW (ref >60)
Glucose, Bld: 113 mg/dL — ABNORMAL HIGH (ref 70–99)
Potassium: 4.1 mmol/L (ref 3.5–5.1)
Sodium: 139 mmol/L (ref 135–145)
Total Bilirubin: 0.6 mg/dL (ref 0.3–1.2)
Total Protein: 6.5 g/dL (ref 6.5–8.1)

## 2022-05-19 LAB — MAGNESIUM: Magnesium: 1.9 mg/dL (ref 1.7–2.4)

## 2022-05-19 MED ORDER — "ELASTIC BANDAGE 6"" MISC": 0 refills | Status: DC

## 2022-05-19 MED ORDER — HEPARIN SOD (PORK) LOCK FLUSH 100 UNIT/ML IV SOLN
500.0000 [IU] | Freq: Once | INTRAVENOUS | Status: AC
  Administered 2022-05-19: 500 [IU] via INTRAVENOUS

## 2022-05-19 MED ORDER — "ABDOMINAL PAD 8""X10"" PADS": MEDICATED_PAD | 0 refills | Status: DC

## 2022-05-19 MED ORDER — SODIUM CHLORIDE 0.9% FLUSH
10.0000 mL | Freq: Once | INTRAVENOUS | Status: AC
  Administered 2022-05-19: 10 mL via INTRAVENOUS

## 2022-05-19 NOTE — Patient Instructions (Signed)
[facility] **   You were seen today by Tarri Abernethy PA-C for your symptom management visit.    LYMPHEDEMA: This is most likely related to your underlying cancer.  Please see the attached handout for further information and read carefully through the instructions below. Use compression wraps during the day.  You can also use gauze pads to help absorb leaking fluid. Keep your legs elevated anytime you are sitting or laying down. Keep your skin clean and pay close attention for any signs of infection. Avoid standing for prolonged amounts of time. We will also check ULTRASOUND to make sure you do not have any blood clots in your legs. You can continue to take FUROSEMIDE (LASIX) 40 mg once daily.  However, this medication has not been shown to have significant effect on patients with edema related to underlying cancer.  It may also cause worsening dehydration and kidney function.  If you do not notice any improvement in your swelling from taking this medication, you can stop taking it.  We will recheck your kidney function next week, and depending on what it shows, we may discontinue this medication at your next visit. CT scan next week will give [place] better information about the cancer in your abdomen.  NEUROPATHY You can take gabapentin as needed for neuropathic pain.  If you need a refill prescription, please let our office know.  NASAL CONGESTION You do not need any antibiotics medication at this time, but pay close attention for any increased cough or fever and chills.  If these happen, please seek medical attention immediately.  FOLLOW-UP APPOINTMENT: Office visit with NP Burns Spain on 05/30/2022.  ** Thank you for trusting me with your healthcare!  I strive to provide all of my patients with quality care at each visit.  If you receive a survey for this visit, I would be so grateful to you for taking the time  to provide feedback.  Thank you in advance!  ~ Vincen Bejar                   Dr. Derek Jack   &   Tarri Abernethy, PA-C   - - - - - - - - - - - - - - - - - -    Thank you for choosing [facility] to provide your oncology and hematology care.  To afford each patient quality time with our provider, please arrive at least 15 minutes before your scheduled appointment time.   If you have a lab appointment with the [facility] please come in thru the Main Entrance and check in at the main information desk.  You need to re-schedule your appointment should you arrive 10 or more minutes late.  We strive to give you quality time with our providers, and arriving late affects you and other patients whose appointments are after yours.  Also, if you no show three or more times for appointments you may be dismissed from the clinic at the providers discretion.     Again, thank you for choosing [facility].  Our hope is that these requests will decrease the amount of time that you wait before being seen by our physicians.       _____________________________________________________________  Should you have questions after your visit to [facility], please contact our office at [phone] and follow the prompts.  Our office  hours are 8:00 a.m. and 4:30 p.m. Monday - Friday.  Please note that voicemails left after 4:00 p.m. may not be returned until the following business day.  We are closed weekends and major holidays.  You do have access to a nurse 24-7, just call the main number to the clinic [phone] and do not press any options, hold on the line and a nurse will answer the phone.    For prescription refill requests, have your pharmacy contact our office and allow 72 hours.

## 2022-05-22 ENCOUNTER — Ambulatory Visit (HOSPITAL_COMMUNITY)
Admission: RE | Admit: 2022-05-22 | Discharge: 2022-05-22 | Disposition: A | Discharge status: Home / Self Care | Payer: Medicare Other | Source: Ambulatory Visit | Attending: Physician Assistant | Admitting: Physician Assistant

## 2022-05-22 ENCOUNTER — Other Ambulatory Visit: Payer: Self-pay | Admitting: *Deleted

## 2022-05-22 DIAGNOSIS — I890 Lymphedema, not elsewhere classified: Secondary | ICD-10-CM | POA: Diagnosis present

## 2022-05-22 DIAGNOSIS — C801 Malignant (primary) neoplasm, unspecified: Secondary | ICD-10-CM | POA: Diagnosis present

## 2022-05-22 DIAGNOSIS — C786 Secondary malignant neoplasm of retroperitoneum and peritoneum: Secondary | ICD-10-CM

## 2022-05-22 NOTE — Progress Notes (Signed)
Patient called and results given to him.

## 2022-05-22 NOTE — Progress Notes (Signed)
Patient called and appointment made for repeat labs on Thursday, per [employer].

## 2022-05-22 NOTE — Progress Notes (Signed)
RN POOL:  Please call patient to inform him that he does not have any blood clots in his legs.

## 2022-05-22 NOTE — Progress Notes (Signed)
Patient called.  Left message for patient to call back.

## 2022-05-23 ENCOUNTER — Ambulatory Visit (HOSPITAL_COMMUNITY)
Admission: RE | Admit: 2022-05-23 | Discharge: 2022-05-23 | Disposition: A | Discharge status: Home / Self Care | Payer: Medicare Other | Source: Ambulatory Visit | Attending: Hematology | Admitting: Hematology

## 2022-05-23 DIAGNOSIS — C787 Secondary malignant neoplasm of liver and intrahepatic bile duct: Secondary | ICD-10-CM | POA: Diagnosis present

## 2022-05-23 DIAGNOSIS — C221 Intrahepatic bile duct carcinoma: Secondary | ICD-10-CM | POA: Diagnosis present

## 2022-05-23 MED ORDER — IOHEXOL 300 MG/ML  SOLN
100.0000 mL | Freq: Once | INTRAMUSCULAR | Status: AC | PRN
  Administered 2022-05-23: 80 mL via INTRAVENOUS

## 2022-05-25 ENCOUNTER — Telehealth: Payer: Self-pay | Admitting: Physician Assistant

## 2022-05-25 ENCOUNTER — Other Ambulatory Visit: Payer: Self-pay | Admitting: Physician Assistant

## 2022-05-25 ENCOUNTER — Inpatient Hospital Stay: Payer: Medicare Other

## 2022-05-25 DIAGNOSIS — C801 Malignant (primary) neoplasm, unspecified: Secondary | ICD-10-CM

## 2022-05-25 DIAGNOSIS — C221 Intrahepatic bile duct carcinoma: Secondary | ICD-10-CM

## 2022-05-25 DIAGNOSIS — Z95828 Presence of other vascular implants and grafts: Secondary | ICD-10-CM

## 2022-05-25 DIAGNOSIS — Z5111 Encounter for antineoplastic chemotherapy: Secondary | ICD-10-CM | POA: Diagnosis not present

## 2022-05-25 LAB — COMPREHENSIVE METABOLIC PANEL
ALT: 29 U/L (ref 0–44)
AST: 35 U/L (ref 15–41)
Albumin: 3.3 g/dL — ABNORMAL LOW (ref 3.5–5.0)
Alkaline Phosphatase: 64 U/L (ref 38–126)
Anion gap: 8 (ref 5–15)
BUN: 38 mg/dL — ABNORMAL HIGH (ref 8–23)
CO2: 26 mmol/L (ref 22–32)
Calcium: 8.7 mg/dL — ABNORMAL LOW (ref 8.9–10.3)
Chloride: 105 mmol/L (ref 98–111)
Creatinine, Ser: 1.61 mg/dL — ABNORMAL HIGH (ref 0.61–1.24)
GFR, Estimated: 47 mL/min — ABNORMAL LOW (ref >60)
Glucose, Bld: 110 mg/dL — ABNORMAL HIGH (ref 70–99)
Potassium: 4.2 mmol/L (ref 3.5–5.1)
Sodium: 139 mmol/L (ref 135–145)
Total Bilirubin: 0.6 mg/dL (ref 0.3–1.2)
Total Protein: 6.1 g/dL — ABNORMAL LOW (ref 6.5–8.1)

## 2022-05-25 MED ORDER — GABAPENTIN 300 MG PO CAPS: 300.0000 mg | ORAL_CAPSULE | Freq: Three times a day (TID) | ORAL | 3 refills | Status: DC

## 2022-05-25 NOTE — Telephone Encounter (Signed)
See documentation per progress note entered under "Orders Only" encounter.

## 2022-05-25 NOTE — Progress Notes (Unsigned)
[facility] [address], [place] [address]   CLINIC:  Medical Oncology/Hematology  PCP:  Celene Squibb, MD [address] 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  PRIOR THERAPY: none  NGS Results: No targetable mutations.  PD-L1 (JO878) negative.  MSI-stable.  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 male, seen for cholangiocarcinoma next cycle of chemotherapy and toxicity assessment.  He is receiving gemcitabine, cisplatin and and Imfinzi.  He reports tingling on and off in the toes mostly in the evenings.  No infections or fevers.  Appetite is good.  No nausea or vomiting.  No abdominal pains.  REVIEW OF SYSTEMS:  Review of Systems  Constitutional:  Negative for appetite change, fatigue and unexpected weight change.  HENT:   Negative for mouth sores (healed) and tinnitus.   Respiratory:  Positive for shortness of breath.   Neurological:  Positive for dizziness and numbness (stable).  Psychiatric/Behavioral:  Negative for sleep disturbance.   All other systems reviewed and are negative.   PAST MEDICAL/SURGICAL HISTORY:  Past Medical History:  Diagnosis Date    Anxiety    Bladder cancer ([place]) 09/11/2009   CAD (coronary artery disease), native coronary artery    a. Mildly elevated troponin 03/2013, cath with nonobstructive disease including 50% AV groove distal stenosis before large OM   Essential hypertension    Headache(784.0)    History of migraines   Hyperglycemia    Mixed hyperlipidemia    Neuropathy    Obesity    Port-A-Cath in place 09/30/2021   Pre-diabetes    Seasonal allergies    Sleep apnea    On CPAP   Past Surgical History:  Procedure Laterality Date   BIOPSY  09/23/2021   Procedure: BIOPSY;  Surgeon: Harvel Quale, MD;  [facility];  Service: Gastroenterology;;   COLONOSCOPY  06/28/2011   Procedure: COLONOSCOPY;  Surgeon: Rogene Houston, MD;  [facility];  Service: Endoscopy;  Laterality: N/A;   COLONOSCOPY WITH PROPOFOL N/A 09/23/2021   Procedure: COLONOSCOPY WITH PROPOFOL;  Surgeon: Harvel Quale, MD;  Location: [facility];  Service: Gastroenterology;  Laterality: N/A;  940   ESOPHAGOGASTRODUODENOSCOPY (EGD) WITH PROPOFOL N/A 09/23/2021   Procedure: ESOPHAGOGASTRODUODENOSCOPY (EGD) WITH PROPOFOL;  Surgeon: Harvel Quale, MD;  [facility];  Service: Gastroenterology;  Laterality: N/A;   IR IMAGING GUIDED PORT INSERTION  09/28/2021   IR PARACENTESIS  09/28/2021   JOINT REPLACEMENT Right    hip   LEFT HEART CATHETERIZATION WITH CORONARY ANGIOGRAM N/A 03/31/2013   Procedure: LEFT HEART CATHETERIZATION WITH CORONARY ANGIOGRAM;  Surgeon: Minus Breeding, MD;  Location: [facility] CATH LAB;  Service: Cardiovascular;  Laterality:  N/A;   POLYPECTOMY  09/23/2021   Procedure: POLYPECTOMY;  Surgeon: Harvel Quale, MD;  [facility];  Service: Gastroenterology;;   TURBT  09/11/2009    SOCIAL HISTORY:  Social History   Socioeconomic History   Marital status: Divorced    Spouse name: Not on file   Number of children: 0   Years of education: college    Highest education level: Not on file  Occupational History    Employer: SELF-EMPLOYED  Tobacco Use   Smoking status: Former    Packs/day: 0.50    Years: 10.00    Total pack years: 5.00    Types: Cigarettes    Quit date: 07/10/2011    Years since quitting: 10.8   Smokeless tobacco: Never   Tobacco comments:    Quit several yrs prior to 03/2013.  Vaping Use   Vaping Use: Never used  Substance and Sexual Activity   Alcohol use: Yes    Alcohol/week: 0.0 standard drinks of alcohol    Comment: Occasional   Drug use: No   Sexual activity: Not on file  Other Topics Concern   Not on file  Social History Narrative   Not on file   Social Determinants of Health   Financial Resource Strain: Not on file  Food Insecurity: Not on file  Transportation Needs: Not on file  Physical Activity: Not on file  Stress: Not on file  Social Connections: Not on file  Intimate Partner Violence: Not on file    FAMILY HISTORY:  Family History  Problem Relation Age of Onset   COPD Father     CURRENT MEDICATIONS:  Current Outpatient Medications  Medication Sig Dispense Refill   amLODipine (NORVASC) 5 MG tablet Take 5 mg by mouth daily.     aspirin EC 81 MG tablet Take 81 mg by mouth daily.     buPROPion (WELLBUTRIN XL) 300 MG 24 hr tablet Take 300 mg by mouth daily.     CISPLATIN IV Inject into the vein once a week. Day 1, Day 8 q 21 days Every other week.     cyclobenzaprine (FLEXERIL) 10 MG tablet Take 10 mg by mouth at bedtime as needed for muscle spasms.     Durvalumab (IMFINZI IV) Inject into the vein every 21 ( twenty-one) days.     Elastic Bandages & Supports (ELASTIC BANDAGE 6") MISC Use compression wraps during the day for leg swelling 4 each 0   Eszopiclone 3 MG TABS Take 3 mg by mouth at bedtime. Take immediately before bedtime     famotidine (PEPCID) 20 MG tablet TAKE (1) TABLET BY MOUTH AT BEDTIME. 30 tablet 0   fenofibrate 160 MG tablet Take 160 mg by mouth daily.      FLUoxetine (PROZAC) 10 MG capsule Take 10 mg by mouth daily.     furosemide (LASIX) 40 MG tablet Take 1 tablet (40 mg total) by mouth daily. 30 tablet 1   Gauze Pads & Dressings (ABDOMINAL PAD) 8"X10" PADS Use for leaking fluid from leg swelling 360 each 0   Gemcitabine HCl (GEMZAR IV) Inject into the vein once a week. Day 1, Day 8 q 21 days Every other week     lidocaine (XYLOCAINE) 2 % solution 5 mLs 4 (four) times daily as needed.     magnesium oxide (MAG-OX) 400 (240 Mg) MG tablet Take 1 tablet (400 mg total) by mouth 2 (two) times daily. (Patient taking differently: Take 400 mg by mouth 3 (three) times  daily.) 90 tablet 6   metFORMIN (GLUCOPHAGE-XR) 500 MG 24 hr tablet Take 500 mg by mouth 2 (two) times daily.     nitroGLYCERIN (NITROSTAT) 0.4 MG SL tablet Place 1 tablet (0.4 mg total) under the tongue every 5 (five) minutes as needed for chest pain. (Patient not taking: Reported on 05/19/2022) 25 tablet 3   olmesartan (BENICAR) 40 MG tablet Take 1 tablet (40 mg total) by mouth daily. 30 tablet 6   omeprazole (PRILOSEC) 40 MG capsule Take 40 mg by mouth daily.     pantoprazole (PROTONIX) 40 MG tablet Take 1 tablet (40 mg total) by mouth daily. 90 tablet 3   pramipexole (MIRAPEX) 0.75 MG tablet Take 1 tablet (0.75 mg total) by mouth 3 (three) times daily. 90 tablet 4   RYBELSUS 7 MG TABS Take 14 mg by mouth daily.     sucralfate (CARAFATE) 1 GM/10ML suspension Take 10 mLs (1 g total) by mouth 4 (four) times daily. Swish and swallow 4 times daily as needed for mouth and throat irritation 420 mL 0   tamsulosin (FLOMAX) 0.4 MG CAPS capsule Take 0.4 mg by mouth daily.     traMADol (ULTRAM) 50 MG tablet Take 50 mg by mouth 4 (four) times daily as needed.     zaleplon (SONATA) 10 MG capsule Take 1 capsule (10 mg total) by mouth at bedtime as needed for sleep. 30 capsule 3   No current facility-administered medications for this visit.   Facility-Administered Medications Ordered in Other Visits   Medication Dose Route Frequency Provider Last Rate Last Admin   magnesium sulfate 2 GM/50ML IVPB             ALLERGIES:  No Known Allergies  PHYSICAL EXAM:  Performance status (ECOG): 0 - Asymptomatic  There were no vitals filed for this visit. Wt Readings from Last 3 Encounters:  05/19/22 272 lb 3.2 oz (123.5 kg)  05/17/22 274 lb 6.4 oz (124.5 kg)  05/03/22 267 lb 1.6 oz (121.2 kg)   Physical Exam Vitals reviewed.  Constitutional:      Appearance: Normal appearance.  Cardiovascular:     Rate and Rhythm: Normal rate and regular rhythm.     Pulses: Normal pulses.     Heart sounds: Normal heart sounds.  Pulmonary:     Effort: Pulmonary effort is normal.     Breath sounds: Normal breath sounds.  Neurological:     General: No focal deficit present.     Mental Status: He is alert and oriented to person, place, and time.  Psychiatric:        Mood and Affect: Mood normal.        Behavior: Behavior normal.     LABORATORY DATA:  I have reviewed the labs as listed.     Latest Ref Rng & Units 05/19/2022    7:49 AM 05/17/2022    7:49 AM 05/03/2022    8:14 AM  CBC  WBC 4.0 - 10.5 K/uL 3.9  4.0  4.8   Hemoglobin 13.0 - 17.0 g/dL 9.9  9.6  10.0   Hematocrit 39.0 - 52.0 % 28.9  28.8  29.2   Platelets 150 - 400 K/uL 182  160  186       Latest Ref Rng & Units 05/25/2022   10:52 AM 05/19/2022    7:49 AM 05/17/2022    7:49 AM  CMP  Glucose 70 - 99 mg/dL 110  113  102   BUN 8 - 23 mg/dL 38  32  28   Creatinine 0.61 - 1.24 mg/dL 1.61  1.41  1.11   Sodium 135 - 145 mmol/L 139  139  139   Potassium 3.5 - 5.1 mmol/L 4.2  4.1  3.7   Chloride 98 - 111 mmol/L 105  105  107   CO2 22 - 32 mmol/L '26  25  27   ' Calcium 8.9 - 10.3 mg/dL 8.7  9.0  8.9   Total Protein 6.5 - 8.1 g/dL 6.1  6.5  5.9   Total Bilirubin 0.3 - 1.2 mg/dL 0.6  0.6  0.8   Alkaline Phos 38 - 126 U/L 64  73  63   AST 15 - 41 U/L 35  26  29   ALT 0 - 44 U/L '29  26  30     ' DIAGNOSTIC IMAGING:  I have independently  reviewed the scans and discussed with the patient. CT CHEST ABDOMEN PELVIS W CONTRAST  Result Date: 05/24/2022 CLINICAL DATA:  Restaging cholangiocarcinoma post chemotherapy. Abdominal discomfort. * Tracking Code: BO * EXAM: CT CHEST, ABDOMEN, AND PELVIS WITH CONTRAST TECHNIQUE: Multidetector CT imaging of the chest, abdomen and pelvis was performed following the standard protocol during bolus administration of intravenous contrast. RADIATION DOSE REDUCTION: This exam was performed according to the departmental dose-optimization program which includes automated exposure control, adjustment of the mA and/or kV according to patient size and/or use of iterative reconstruction technique. CONTRAST:  75m OMNIPAQUE IOHEXOL 300 MG/ML  SOLN COMPARISON:  Prior examinations 03/04/2022 and 12/20/2021. FINDINGS: CT CHEST FINDINGS Cardiovascular: No acute vascular findings. A right IJ Port-A-Cath extends to the superior cavoatrial junction. Mild atherosclerosis of the aorta and coronary arteries. The heart size is normal. There is no pericardial effusion. Mediastinum/Nodes: There are no enlarged mediastinal, hilar or axillary lymph nodes. Small mediastinal lymph nodes appear unchanged. The thyroid gland, trachea and esophagus demonstrate no significant findings. Lungs/Pleura: No pleural effusion or pneumothorax. Chronic central airway thickening and scattered subpleural reticulation. No suspicious pulmonary nodules. Musculoskeletal/Chest wall: No chest wall mass or suspicious osseous findings. CT ABDOMEN AND PELVIS FINDINGS Hepatobiliary: Cholelithiasis, gallbladder wall thickening and pericholecystic soft tissue stranding are unchanged. Further decrease in size of the low-density mass adjacent to the gallbladder within segment 4B, measuring approximately 1.8 x 1.0 cm on image 62/2 (previously 2.2 x 2.0 cm). No residual abnormality identified in the adjacent right lobe. No new or enlarging hepatic lesions are identified. No  significant biliary dilatation. Pancreas: Mild fatty replacement. No focal lesion, ductal dilatation or surrounding inflammation. Spleen: Normal in size without focal abnormality. Adrenals/Urinary Tract: Both adrenal glands appear normal. The kidneys appear stable, without evidence of urinary tract calculus, hydronephrosis or suspicious lesion. The bladder appears normal for its degree of distention. Stomach/Bowel: Enteric contrast was administered and has passed into the distal small bowel. The stomach appears unremarkable for its degree of distension. No evidence of bowel wall thickening, distention or surrounding inflammatory change. The appendix appears normal. Mildly prominent stool throughout the colon. Mild diverticulosis of the descending and sigmoid colon. Vascular/Lymphatic: There are no enlarged abdominal or pelvic lymph nodes. Mildly prominent inguinal lymph nodes bilaterally, likely reactive. Mild aortic and branch vessel atherosclerosis without acute vascular findings or aneurysm. Reproductive: The prostate gland and seminal vesicles appear unremarkable. Other: No evidence of abdominal wall mass or hernia. No recurrent ascites or peritoneal nodularity. Musculoskeletal: No acute or significant osseous findings. Degenerative disc disease at L4-5 and previous right total hip arthroplasty are noted. Stable left intergluteal lipoma. IMPRESSION:  1. Further decrease in size of small left hepatic lobe lesion adjacent to the gallbladder, consistent with response to therapy. Underlying gallbladder wall thickening appears similar, attributed to the patient's cholangiocarcinoma. 2. No significant residual peritoneal disease or new distant metastases identified. No evidence of thoracic metastatic disease. 3. Coronary and Aortic Atherosclerosis (ICD10-I70.0). Mild distal colonic diverticulosis. Electronically Signed   By: Richardean Sale M.D.   On: 05/24/2022 11:10   US Venous Img Lower Bilateral  Result Date:  05/22/2022 CLINICAL DATA:  Bilateral lower extremity pain, edema in lymphedema. History of cholangiocarcinoma. EXAM: BILATERAL LOWER EXTREMITY VENOUS DOPPLER ULTRASOUND TECHNIQUE: Gray-scale sonography with graded compression, as well as color Doppler and duplex ultrasound were performed to evaluate the lower extremity deep venous systems from the level of the common femoral vein and including the common femoral, femoral, profunda femoral, popliteal and calf veins including the posterior tibial, peroneal and gastrocnemius veins when visible. The superficial great saphenous vein was also interrogated. Spectral Doppler was utilized to evaluate flow at rest and with distal augmentation maneuvers in the common femoral, femoral and popliteal veins. COMPARISON:  None Available. FINDINGS: RIGHT LOWER EXTREMITY Common Femoral Vein: No evidence of thrombus. Normal compressibility, respiratory phasicity and response to augmentation. Saphenofemoral Junction: No evidence of thrombus. Normal compressibility and flow on color Doppler imaging. Profunda Femoral Vein: No evidence of thrombus. Normal compressibility and flow on color Doppler imaging. Femoral Vein: No evidence of thrombus. Normal compressibility, respiratory phasicity and response to augmentation. Popliteal Vein: No evidence of thrombus. Normal compressibility, respiratory phasicity and response to augmentation. Calf Veins: No evidence of thrombus. Normal compressibility and flow on color Doppler imaging. Superficial Great Saphenous Vein: No evidence of thrombus. Normal compressibility. Venous Reflux:  None. Other Findings: No evidence of superficial thrombophlebitis or abnormal fluid collection. LEFT LOWER EXTREMITY Common Femoral Vein: No evidence of thrombus. Normal compressibility, respiratory phasicity and response to augmentation. Saphenofemoral Junction: No evidence of thrombus. Normal compressibility and flow on color Doppler imaging. Profunda Femoral Vein: No  evidence of thrombus. Normal compressibility and flow on color Doppler imaging. Femoral Vein: No evidence of thrombus. Normal compressibility, respiratory phasicity and response to augmentation. Popliteal Vein: No evidence of thrombus. Normal compressibility, respiratory phasicity and response to augmentation. Calf Veins: No evidence of thrombus. Normal compressibility and flow on color Doppler imaging. Superficial Great Saphenous Vein: No evidence of thrombus. Normal compressibility. Venous Reflux:  None. Other Findings: No evidence of superficial thrombophlebitis or abnormal fluid collection. IMPRESSION: No evidence of deep venous thrombosis in either lower extremity. Electronically Signed   By: Aletta Edouard M.D.   On: 05/22/2022 14:21     ASSESSMENT:  Liver lesion/peritoneal carcinomatosis: - Patient seen at the request of Dr. Delphina Cahill - Reported pressure on the sides of the abdomen with slight pain for the last 1 month.  He also reported pain in the epigastric region. - He had lost 20 pounds in the last 3 to 4 months intentionally, cutting back on sugars.  He was also started on semaglutide. - Colonoscopy on 06/28/2011 with a benign polypoid colonic mucosa in the descending colon.  No evidence of malignancy. - MRI of the brain was negative. - EGD and colonoscopy on 09/23/2018 did not reveal any malignancies. 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  IHC positive for CK7, CDX2 and negative for GATA3.  Findings suggestive of upper GI or pancreaticobiliary primary. - Cycle 1 of gemcitabine, cisplatin and durvalumab started on 10/05/2021. - NGS: No targetable mutations.  PD-L1 (  XT056) negative.  MSI-stable.  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   Social/family history: - He lives at home by himself.  He records voiceover/narrations. - Non-smoker. - Father died of MDS.  Paternal grandfather had prostate cancer.  Maternal grandfather had cancer.  3.  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 Alinda Money.  Reportedly received 1 treatment of intravesical chemo and has been on surveillance since then.  Last surveillance visit was in 2020.   PLAN:  Cholangiocarcinoma with peritoneal carcinomatosis: - CT CAP (03/04/2022): Minimal new perihilar reticular and tree-in-bud opacities in the left upper lobe inflammatory/infectious.  Left lower lobe lesion decreased in size. - He did not notice any worsening shortness of breath or dry cough after last treatment with immunotherapy. - I have reviewed his labs.  AST is 99.  Creatinine is elevated at 1.4. - Last CEA was 2.9. - Proceed with next cycle of chemotherapy.  He will also receive 1 L of fluid with electrolytes.  RTC 4 weeks for follow-up.  We will plan to do CT CAP, CEA along with routine labs at that time.  If the scans are stable or improved, continue the same treatment.   Hypertension: - Continue Benicar and amlodipine.  Blood pressure is 132/63.  3.  Neuropathy/"drawing" of legs: - Continue Mirapex 0.75 mg 3 times daily.  4.  Sleeping difficulty: - Can continue Lunesta as needed.  5.  Hypomagnesemia: - New magnesium 3 times daily.  Magnesium is 2.1.   Orders placed this encounter:  No orders of the defined types were placed in this encounter.    Derek Jack, MD [facility] [phone]

## 2022-05-25 NOTE — Progress Notes (Signed)
I called Mr. Yanni Quiroa by telephone this afternoon to follow-up on his bilateral peripheral edema and to discuss results of his CMP today.  He is continuing to have significant peripheral edema, but with decreased weeping.  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  He is taking gabapentin and tramadol with some relief.  He has not been able to use his Ace wrap bandages (difficulty getting them tight enough), but was able to wear his compression stockings.  He has been taking Lasix daily.  CMP today showed continued increase of creatinine at 1.61.  Therefore, patient advised to STOP taking Lasix due to concern for worsening kidney function.  Discussed with patient that we will prioritize kidney function since he is also receiving nephrotoxic chemotherapy.  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  Reinforced to patient that he should elevate his legs and avoid prolonged time standing on his feet.  Advised to wear compression stockings during the day but to take them off at night.  Patient is scheduled for repeat labs and follow-up visit with NP Burns Spain next Tuesday, 05/30/2022.  Refill prescription for gabapentin sent to pharmacy.  05/25/2022 3:49 PM Tarri Abernethy PA-C-Hematology/Oncology at [facility]

## 2022-05-30 ENCOUNTER — Inpatient Hospital Stay: Payer: Medicare Other

## 2022-05-30 ENCOUNTER — Inpatient Hospital Stay (HOSPITAL_BASED_OUTPATIENT_CLINIC_OR_DEPARTMENT_OTHER): Payer: Medicare Other | Admitting: Nurse Practitioner

## 2022-05-30 ENCOUNTER — Inpatient Hospital Stay: Payer: Medicare Other | Admitting: Nurse Practitioner

## 2022-05-30 VITALS — BP 131/74 | HR 79 | Temp 97.9°F | Resp 18 | Ht 72.0 in | Wt 264.9 lb

## 2022-05-30 DIAGNOSIS — C787 Secondary malignant neoplasm of liver and intrahepatic bile duct: Secondary | ICD-10-CM

## 2022-05-30 DIAGNOSIS — G2581 Restless legs syndrome: Secondary | ICD-10-CM

## 2022-05-30 DIAGNOSIS — Z95828 Presence of other vascular implants and grafts: Secondary | ICD-10-CM

## 2022-05-30 DIAGNOSIS — C221 Intrahepatic bile duct carcinoma: Secondary | ICD-10-CM

## 2022-05-30 DIAGNOSIS — C786 Secondary malignant neoplasm of retroperitoneum and peritoneum: Secondary | ICD-10-CM | POA: Diagnosis not present

## 2022-05-30 DIAGNOSIS — Z5111 Encounter for antineoplastic chemotherapy: Secondary | ICD-10-CM | POA: Diagnosis not present

## 2022-05-30 DIAGNOSIS — G629 Polyneuropathy, unspecified: Secondary | ICD-10-CM

## 2022-05-30 DIAGNOSIS — Z0000 Encounter for general adult medical examination without abnormal findings: Secondary | ICD-10-CM

## 2022-05-30 LAB — COMPREHENSIVE METABOLIC PANEL
ALT: 25 U/L (ref 0–44)
AST: 26 U/L (ref 15–41)
Albumin: 3.4 g/dL — ABNORMAL LOW (ref 3.5–5.0)
Alkaline Phosphatase: 69 U/L (ref 38–126)
Anion gap: 9 (ref 5–15)
BUN: 31 mg/dL — ABNORMAL HIGH (ref 8–23)
CO2: 25 mmol/L (ref 22–32)
Calcium: 9.2 mg/dL (ref 8.9–10.3)
Chloride: 104 mmol/L (ref 98–111)
Creatinine, Ser: 1.42 mg/dL — ABNORMAL HIGH (ref 0.61–1.24)
GFR, Estimated: 54 mL/min — ABNORMAL LOW (ref >60)
Glucose, Bld: 147 mg/dL — ABNORMAL HIGH (ref 70–99)
Potassium: 3.7 mmol/L (ref 3.5–5.1)
Sodium: 138 mmol/L (ref 135–145)
Total Bilirubin: 0.6 mg/dL (ref 0.3–1.2)
Total Protein: 6.6 g/dL (ref 6.5–8.1)

## 2022-05-30 LAB — CBC WITH DIFFERENTIAL/PLATELET
Abs Immature Granulocytes: 0.06 10*3/uL (ref 0.00–0.07)
Basophils Absolute: 0 10*3/uL (ref 0.0–0.1)
Basophils Relative: 1 %
Eosinophils Absolute: 0.3 10*3/uL (ref 0.0–0.5)
Eosinophils Relative: 4 %
HCT: 30.6 % — ABNORMAL LOW (ref 39.0–52.0)
Hemoglobin: 10.2 g/dL — ABNORMAL LOW (ref 13.0–17.0)
Immature Granulocytes: 1 %
Lymphocytes Relative: 14 %
Lymphs Abs: 0.9 10*3/uL (ref 0.7–4.0)
MCH: 32.9 pg (ref 26.0–34.0)
MCHC: 33.3 g/dL (ref 30.0–36.0)
MCV: 98.7 fL (ref 80.0–100.0)
Monocytes Absolute: 0.9 10*3/uL (ref 0.1–1.0)
Monocytes Relative: 14 %
Neutro Abs: 4.4 10*3/uL (ref 1.7–7.7)
Neutrophils Relative %: 66 %
Platelets: 255 10*3/uL (ref 150–400)
RBC: 3.1 MIL/uL — ABNORMAL LOW (ref 4.22–5.81)
RDW: 13.6 % (ref 11.5–15.5)
WBC: 6.6 10*3/uL (ref 4.0–10.5)
nRBC: 0 % (ref 0.0–0.2)

## 2022-05-30 LAB — MAGNESIUM: Magnesium: 1.8 mg/dL (ref 1.7–2.4)

## 2022-05-30 MED ORDER — HEPARIN SOD (PORK) LOCK FLUSH 100 UNIT/ML IV SOLN
500.0000 [IU] | Freq: Once | INTRAVENOUS | Status: AC
  Administered 2022-05-30: 500 [IU] via INTRAVENOUS

## 2022-05-30 MED ORDER — SODIUM CHLORIDE 0.9% FLUSH
10.0000 mL | INTRAVENOUS | Status: DC | PRN
  Administered 2022-05-30: 10 mL via INTRAVENOUS

## 2022-05-30 NOTE — Progress Notes (Signed)
Patients port flushed without difficulty.  Good blood return noted with no bruising or swelling noted at site. Patient remains accessed.  

## 2022-05-31 ENCOUNTER — Inpatient Hospital Stay: Payer: Medicare Other

## 2022-05-31 VITALS — BP 125/69 | HR 69 | Temp 97.4°F | Resp 18

## 2022-05-31 DIAGNOSIS — Z5111 Encounter for antineoplastic chemotherapy: Secondary | ICD-10-CM | POA: Diagnosis not present

## 2022-05-31 DIAGNOSIS — C221 Intrahepatic bile duct carcinoma: Secondary | ICD-10-CM

## 2022-05-31 DIAGNOSIS — Z95828 Presence of other vascular implants and grafts: Secondary | ICD-10-CM

## 2022-05-31 MED ORDER — HEPARIN SOD (PORK) LOCK FLUSH 100 UNIT/ML IV SOLN
500.0000 [IU] | Freq: Once | INTRAVENOUS | Status: AC | PRN
  Administered 2022-05-31: 500 [IU]

## 2022-05-31 MED ORDER — SODIUM CHLORIDE 0.9 % IV SOLN: Freq: Once | INTRAVENOUS | Status: AC

## 2022-05-31 MED ORDER — SODIUM CHLORIDE 0.9 % IV SOLN
1000.0000 mg/m2 | Freq: Once | INTRAVENOUS | Status: AC
  Administered 2022-05-31: 2470 mg via INTRAVENOUS
  Filled 2022-05-31: qty 52.6

## 2022-05-31 MED ORDER — SODIUM CHLORIDE 0.9 % IV SOLN
22.5000 mg/m2 | Freq: Once | INTRAVENOUS | Status: AC
  Administered 2022-05-31: 56 mg via INTRAVENOUS
  Filled 2022-05-31: qty 56

## 2022-05-31 MED ORDER — MAGNESIUM SULFATE 2 GM/50ML IV SOLN
2.0000 g | Freq: Once | INTRAVENOUS | Status: AC
  Administered 2022-05-31: 2 g via INTRAVENOUS
  Filled 2022-05-31: qty 50

## 2022-05-31 MED ORDER — SODIUM CHLORIDE 0.9 % IV SOLN: INTRAVENOUS | Status: AC

## 2022-05-31 MED ORDER — SODIUM CHLORIDE 0.9% FLUSH
10.0000 mL | INTRAVENOUS | Status: DC | PRN
  Administered 2022-05-31: 10 mL

## 2022-05-31 MED ORDER — POTASSIUM CHLORIDE IN NACL 20-0.9 MEQ/L-% IV SOLN
Freq: Once | INTRAVENOUS | Status: AC
  Filled 2022-05-31: qty 1000

## 2022-05-31 MED ORDER — SODIUM CHLORIDE 0.9 % IV SOLN
10.0000 mg | Freq: Once | INTRAVENOUS | Status: AC
  Administered 2022-05-31: 10 mg via INTRAVENOUS
  Filled 2022-05-31: qty 10

## 2022-05-31 MED ORDER — SODIUM CHLORIDE 0.9 % IV SOLN
150.0000 mg | Freq: Once | INTRAVENOUS | Status: AC
  Administered 2022-05-31: 150 mg via INTRAVENOUS
  Filled 2022-05-31: qty 150

## 2022-05-31 MED ORDER — PALONOSETRON HCL INJECTION 0.25 MG/5ML
0.2500 mg | Freq: Once | INTRAVENOUS | Status: AC
  Administered 2022-05-31: 0.25 mg via INTRAVENOUS
  Filled 2022-05-31: qty 5

## 2022-05-31 NOTE — Patient Instructions (Signed)
[facility]  Discharge Instructions: Thank you for choosing [facility] to provide your oncology and hematology care.  If you have a lab appointment with the [facility], please come in thru the Main Entrance and check in at the main information desk.  Wear comfortable clothing and clothing appropriate for easy access to any Portacath or PICC line.   We strive to give you quality time with your provider. You may need to reschedule your appointment if you arrive late (15 or more minutes).  Arriving late affects you and other patients whose appointments are after yours.  Also, if you miss three or more appointments without notifying the office, you may be dismissed from the clinic at the provider's discretion.      For prescription refill requests, have your pharmacy contact our office and allow 72 hours for refills to be completed.    Today you received the following chemotherapy and/or immunotherapy agents Gemzar /Cisplatin      To help prevent nausea and vomiting after your treatment, we encourage you to take your nausea medication as directed.  BELOW ARE SYMPTOMS THAT SHOULD BE REPORTED IMMEDIATELY: *FEVER GREATER THAN 100.4 F (38 C) OR HIGHER *CHILLS OR SWEATING *NAUSEA AND VOMITING THAT IS NOT CONTROLLED WITH YOUR NAUSEA MEDICATION *UNUSUAL SHORTNESS OF BREATH *UNUSUAL BRUISING OR BLEEDING *URINARY PROBLEMS (pain or burning when urinating, or frequent urination) *BOWEL PROBLEMS (unusual diarrhea, constipation, pain near the anus) TENDERNESS IN MOUTH AND THROAT WITH OR WITHOUT PRESENCE OF ULCERS (sore throat, sores in mouth, or a toothache) UNUSUAL RASH, SWELLING OR PAIN  UNUSUAL VAGINAL DISCHARGE OR ITCHING   Items with * indicate a potential emergency and should be followed up as soon as possible or go to the Emergency Department if any problems should occur.  Please show the CHEMOTHERAPY ALERT CARD or IMMUNOTHERAPY ALERT CARD at check-in to the  Emergency Department and triage nurse.  Should you have questions after your visit or need to cancel or reschedule your appointment, please contact [facility] [phone]  and follow the prompts.  Office hours are 8:00 a.m. to 4:30 p.m. Monday - Friday. Please note that voicemails left after 4:00 p.m. may not be returned until the following business day.  We are closed weekends and major holidays. You have access to a nurse at all times for urgent questions. Please call the main number to the clinic [phone] and follow the prompts.  For any non-urgent questions, you may also contact your provider using MyChart. We now offer e-Visits for anyone 57 and older to request care online for non-urgent symptoms. For details visit [email or web address].   Also download the MyChart app! Go to the app store, search "MyChart", open the app, select [facility], and log in with your MyChart username and password.  Masks are optional in the cancer centers. If you would like for your care team to wear a mask while they are taking care of you, please let them know. You may have one support person who is at least 67 years old accompany you for your appointments.

## 2022-05-31 NOTE — Progress Notes (Signed)
Patient presents today for Gemzar/Cisplatin per providers order.  Vital signs and labs within parameters for treatment.  Patient has no new complaints at this time.  Patient voided 375 cc.  Treatment given today per MD orders.  Stable during infusion without adverse affects.  Vital signs stable.  No complaints at this time.  Discharge from clinic ambulatory in stable condition.  Alert and oriented X 3.  Follow up with [facility] as scheduled.

## 2022-05-31 NOTE — Progress Notes (Signed)
Orders received from Burns Spain, NP to reduce cisplatin dose by 10% due to neuropathy.  Plan updated to reflect new dose of cisplatin at 22.5 mg/m2   V.O. Burns Spain, NP/Taylr Meuth Ronnald Ramp, PharmD

## 2022-06-04 ENCOUNTER — Other Ambulatory Visit: Payer: Self-pay

## 2022-06-04 ENCOUNTER — Emergency Department (HOSPITAL_COMMUNITY): Payer: Medicare Other

## 2022-06-04 ENCOUNTER — Inpatient Hospital Stay (HOSPITAL_COMMUNITY)
Admission: EM | Admit: 2022-06-04 | Discharge: 2022-06-08 | DRG: 872 | Disposition: A | Discharge status: Home / Self Care | Payer: Medicare Other | Attending: Internal Medicine | Admitting: Internal Medicine

## 2022-06-04 ENCOUNTER — Encounter (HOSPITAL_COMMUNITY): Payer: Self-pay | Admitting: Emergency Medicine

## 2022-06-04 DIAGNOSIS — C221 Intrahepatic bile duct carcinoma: Secondary | ICD-10-CM

## 2022-06-04 DIAGNOSIS — Z7984 Long term (current) use of oral hypoglycemic drugs: Secondary | ICD-10-CM

## 2022-06-04 DIAGNOSIS — L0390 Cellulitis, unspecified: Secondary | ICD-10-CM

## 2022-06-04 DIAGNOSIS — Z20822 Contact with and (suspected) exposure to covid-19: Secondary | ICD-10-CM | POA: Diagnosis present

## 2022-06-04 DIAGNOSIS — G629 Polyneuropathy, unspecified: Secondary | ICD-10-CM

## 2022-06-04 DIAGNOSIS — I110 Hypertensive heart disease with heart failure: Secondary | ICD-10-CM | POA: Diagnosis present

## 2022-06-04 DIAGNOSIS — C787 Secondary malignant neoplasm of liver and intrahepatic bile duct: Secondary | ICD-10-CM | POA: Diagnosis present

## 2022-06-04 DIAGNOSIS — G4733 Obstructive sleep apnea (adult) (pediatric): Secondary | ICD-10-CM | POA: Diagnosis present

## 2022-06-04 DIAGNOSIS — R609 Edema, unspecified: Secondary | ICD-10-CM

## 2022-06-04 DIAGNOSIS — A419 Sepsis, unspecified organism: Principal | ICD-10-CM | POA: Diagnosis present

## 2022-06-04 DIAGNOSIS — E8770 Fluid overload, unspecified: Secondary | ICD-10-CM | POA: Diagnosis present

## 2022-06-04 DIAGNOSIS — T451X5A Adverse effect of antineoplastic and immunosuppressive drugs, initial encounter: Secondary | ICD-10-CM | POA: Diagnosis present

## 2022-06-04 DIAGNOSIS — R7303 Prediabetes: Secondary | ICD-10-CM | POA: Diagnosis present

## 2022-06-04 DIAGNOSIS — Z7982 Long term (current) use of aspirin: Secondary | ICD-10-CM

## 2022-06-04 DIAGNOSIS — Z825 Family history of asthma and other chronic lower respiratory diseases: Secondary | ICD-10-CM | POA: Diagnosis not present

## 2022-06-04 DIAGNOSIS — I2510 Atherosclerotic heart disease of native coronary artery without angina pectoris: Secondary | ICD-10-CM | POA: Diagnosis present

## 2022-06-04 DIAGNOSIS — Z87891 Personal history of nicotine dependence: Secondary | ICD-10-CM

## 2022-06-04 DIAGNOSIS — F32A Depression, unspecified: Secondary | ICD-10-CM | POA: Diagnosis present

## 2022-06-04 DIAGNOSIS — X58XXXA Exposure to other specified factors, initial encounter: Secondary | ICD-10-CM | POA: Diagnosis not present

## 2022-06-04 DIAGNOSIS — Z8551 Personal history of malignant neoplasm of bladder: Secondary | ICD-10-CM | POA: Diagnosis not present

## 2022-06-04 DIAGNOSIS — E669 Obesity, unspecified: Secondary | ICD-10-CM | POA: Diagnosis present

## 2022-06-04 DIAGNOSIS — C786 Secondary malignant neoplasm of retroperitoneum and peritoneum: Secondary | ICD-10-CM | POA: Diagnosis present

## 2022-06-04 DIAGNOSIS — R509 Fever, unspecified: Secondary | ICD-10-CM | POA: Insufficient documentation

## 2022-06-04 DIAGNOSIS — Z23 Encounter for immunization: Secondary | ICD-10-CM

## 2022-06-04 DIAGNOSIS — G43909 Migraine, unspecified, not intractable, without status migrainosus: Secondary | ICD-10-CM | POA: Diagnosis present

## 2022-06-04 DIAGNOSIS — F419 Anxiety disorder, unspecified: Secondary | ICD-10-CM | POA: Diagnosis present

## 2022-06-04 DIAGNOSIS — Z6835 Body mass index (BMI) 35.0-35.9, adult: Secondary | ICD-10-CM

## 2022-06-04 DIAGNOSIS — I509 Heart failure, unspecified: Secondary | ICD-10-CM | POA: Diagnosis not present

## 2022-06-04 DIAGNOSIS — L03116 Cellulitis of left lower limb: Secondary | ICD-10-CM | POA: Diagnosis present

## 2022-06-04 DIAGNOSIS — L03115 Cellulitis of right lower limb: Secondary | ICD-10-CM | POA: Diagnosis present

## 2022-06-04 DIAGNOSIS — E782 Mixed hyperlipidemia: Secondary | ICD-10-CM | POA: Diagnosis present

## 2022-06-04 DIAGNOSIS — R6510 Systemic inflammatory response syndrome (SIRS) of non-infectious origin without acute organ dysfunction: Principal | ICD-10-CM

## 2022-06-04 DIAGNOSIS — R600 Localized edema: Secondary | ICD-10-CM

## 2022-06-04 DIAGNOSIS — I10 Essential (primary) hypertension: Secondary | ICD-10-CM | POA: Diagnosis present

## 2022-06-04 LAB — RESP PANEL BY RT-PCR (FLU A&B, COVID) ARPGX2
Influenza A by PCR: NEGATIVE
Influenza B by PCR: NEGATIVE
SARS Coronavirus 2 by RT PCR: NEGATIVE

## 2022-06-04 LAB — COMPREHENSIVE METABOLIC PANEL
ALT: 20 U/L (ref 0–44)
AST: 23 U/L (ref 15–41)
Albumin: 3.1 g/dL — ABNORMAL LOW (ref 3.5–5.0)
Alkaline Phosphatase: 50 U/L (ref 38–126)
Anion gap: 7 (ref 5–15)
BUN: 28 mg/dL — ABNORMAL HIGH (ref 8–23)
CO2: 22 mmol/L (ref 22–32)
Calcium: 8.4 mg/dL — ABNORMAL LOW (ref 8.9–10.3)
Chloride: 102 mmol/L (ref 98–111)
Creatinine, Ser: 1.4 mg/dL — ABNORMAL HIGH (ref 0.61–1.24)
GFR, Estimated: 55 mL/min — ABNORMAL LOW (ref >60)
Glucose, Bld: 142 mg/dL — ABNORMAL HIGH (ref 70–99)
Potassium: 4.2 mmol/L (ref 3.5–5.1)
Sodium: 131 mmol/L — ABNORMAL LOW (ref 135–145)
Total Bilirubin: 0.6 mg/dL (ref 0.3–1.2)
Total Protein: 6.2 g/dL — ABNORMAL LOW (ref 6.5–8.1)

## 2022-06-04 LAB — URINALYSIS, ROUTINE W REFLEX MICROSCOPIC
Bacteria, UA: NONE SEEN
Bilirubin Urine: NEGATIVE
Glucose, UA: NEGATIVE mg/dL
Hgb urine dipstick: NEGATIVE
Ketones, ur: NEGATIVE mg/dL
Leukocytes,Ua: NEGATIVE
Nitrite: NEGATIVE
Protein, ur: 30 mg/dL — AB
Specific Gravity, Urine: 1.016 (ref 1.005–1.030)
pH: 7 (ref 5.0–8.0)

## 2022-06-04 LAB — PROTIME-INR
INR: 1.1 (ref 0.8–1.2)
Prothrombin Time: 14.3 seconds (ref 11.4–15.2)

## 2022-06-04 LAB — CBC WITH DIFFERENTIAL/PLATELET
Abs Immature Granulocytes: 0.08 10*3/uL — ABNORMAL HIGH (ref 0.00–0.07)
Basophils Absolute: 0 10*3/uL (ref 0.0–0.1)
Basophils Relative: 0 %
Eosinophils Absolute: 0 10*3/uL (ref 0.0–0.5)
Eosinophils Relative: 0 %
HCT: 28.6 % — ABNORMAL LOW (ref 39.0–52.0)
Hemoglobin: 9.5 g/dL — ABNORMAL LOW (ref 13.0–17.0)
Immature Granulocytes: 1 %
Lymphocytes Relative: 4 %
Lymphs Abs: 0.5 10*3/uL — ABNORMAL LOW (ref 0.7–4.0)
MCH: 32.6 pg (ref 26.0–34.0)
MCHC: 33.2 g/dL (ref 30.0–36.0)
MCV: 98.3 fL (ref 80.0–100.0)
Monocytes Absolute: 0.2 10*3/uL (ref 0.1–1.0)
Monocytes Relative: 2 %
Neutro Abs: 12.1 10*3/uL — ABNORMAL HIGH (ref 1.7–7.7)
Neutrophils Relative %: 93 %
Platelets: 205 10*3/uL (ref 150–400)
RBC: 2.91 MIL/uL — ABNORMAL LOW (ref 4.22–5.81)
RDW: 13.4 % (ref 11.5–15.5)
WBC: 13 10*3/uL — ABNORMAL HIGH (ref 4.0–10.5)
nRBC: 0 % (ref 0.0–0.2)

## 2022-06-04 LAB — LACTIC ACID, PLASMA
Lactic Acid, Venous: 1.3 mmol/L (ref 0.5–1.9)
Lactic Acid, Venous: 0.9 mmol/L (ref 0.5–1.9)

## 2022-06-04 LAB — APTT: aPTT: 38 seconds — ABNORMAL HIGH (ref 24–36)

## 2022-06-04 LAB — CBG MONITORING, ED: Glucose-Capillary: 115 mg/dL — ABNORMAL HIGH (ref 70–99)

## 2022-06-04 MED ORDER — ACETAMINOPHEN 325 MG PO TABS: 650.0000 mg | ORAL_TABLET | Freq: Four times a day (QID) | ORAL | Status: DC | PRN

## 2022-06-04 MED ORDER — INSULIN ASPART 100 UNIT/ML IJ SOLN
0.0000 [IU] | Freq: Three times a day (TID) | INTRAMUSCULAR | Status: DC
  Administered 2022-06-05: 2 [IU] via SUBCUTANEOUS
  Administered 2022-06-06: 3 [IU] via SUBCUTANEOUS
  Administered 2022-06-06 – 2022-06-07 (×3): 2 [IU] via SUBCUTANEOUS

## 2022-06-04 MED ORDER — VANCOMYCIN HCL 2000 MG/400ML IV SOLN
2000.0000 mg | Freq: Once | INTRAVENOUS | Status: AC
  Administered 2022-06-04: 2000 mg via INTRAVENOUS
  Filled 2022-06-04: qty 400

## 2022-06-04 MED ORDER — SODIUM CHLORIDE 0.9 % IV SOLN
2.0000 g | Freq: Three times a day (TID) | INTRAVENOUS | Status: DC
  Administered 2022-06-05 (×2): 2 g via INTRAVENOUS
  Filled 2022-06-04 (×2): qty 12.5

## 2022-06-04 MED ORDER — FENOFIBRATE 160 MG PO TABS
160.0000 mg | ORAL_TABLET | Freq: Every day | ORAL | Status: DC
  Administered 2022-06-05 – 2022-06-08 (×4): 160 mg via ORAL
  Filled 2022-06-04 (×4): qty 1

## 2022-06-04 MED ORDER — MAGNESIUM OXIDE -MG SUPPLEMENT 400 (240 MG) MG PO TABS
400.0000 mg | ORAL_TABLET | Freq: Three times a day (TID) | ORAL | Status: DC
  Administered 2022-06-04 – 2022-06-08 (×11): 400 mg via ORAL
  Filled 2022-06-04 (×11): qty 1

## 2022-06-04 MED ORDER — ASPIRIN 81 MG PO TBEC
81.0000 mg | DELAYED_RELEASE_TABLET | Freq: Every day | ORAL | Status: DC
  Administered 2022-06-05 – 2022-06-08 (×4): 81 mg via ORAL
  Filled 2022-06-04 (×4): qty 1

## 2022-06-04 MED ORDER — VANCOMYCIN HCL IN DEXTROSE 1-5 GM/200ML-% IV SOLN: 1000.0000 mg | Freq: Once | INTRAVENOUS | Status: DC

## 2022-06-04 MED ORDER — PRAMIPEXOLE DIHYDROCHLORIDE 0.25 MG PO TABS
0.7500 mg | ORAL_TABLET | Freq: Three times a day (TID) | ORAL | Status: DC
  Administered 2022-06-04 – 2022-06-08 (×11): 0.75 mg via ORAL
  Filled 2022-06-04 (×14): qty 3

## 2022-06-04 MED ORDER — NITROGLYCERIN 0.4 MG SL SUBL: 0.4000 mg | SUBLINGUAL_TABLET | SUBLINGUAL | Status: DC | PRN

## 2022-06-04 MED ORDER — AMLODIPINE BESYLATE 5 MG PO TABS: 5.0000 mg | ORAL_TABLET | Freq: Every day | ORAL | Status: DC

## 2022-06-04 MED ORDER — BUPROPION HCL ER (XL) 300 MG PO TB24
300.0000 mg | ORAL_TABLET | Freq: Every day | ORAL | Status: DC
  Administered 2022-06-05 – 2022-06-08 (×4): 300 mg via ORAL
  Filled 2022-06-04 (×4): qty 1

## 2022-06-04 MED ORDER — ONDANSETRON HCL 4 MG/2ML IJ SOLN: 4.0000 mg | Freq: Four times a day (QID) | INTRAMUSCULAR | Status: DC | PRN

## 2022-06-04 MED ORDER — TRAMADOL HCL 50 MG PO TABS
50.0000 mg | ORAL_TABLET | Freq: Four times a day (QID) | ORAL | Status: DC | PRN
  Administered 2022-06-05 – 2022-06-07 (×8): 50 mg via ORAL
  Filled 2022-06-04 (×9): qty 1

## 2022-06-04 MED ORDER — GABAPENTIN 300 MG PO CAPS
300.0000 mg | ORAL_CAPSULE | Freq: Three times a day (TID) | ORAL | Status: DC
  Administered 2022-06-04 – 2022-06-06 (×8): 300 mg via ORAL
  Filled 2022-06-04 (×8): qty 1

## 2022-06-04 MED ORDER — FLUOXETINE HCL 10 MG PO CAPS
10.0000 mg | ORAL_CAPSULE | Freq: Every day | ORAL | Status: DC
  Administered 2022-06-05 – 2022-06-08 (×4): 10 mg via ORAL
  Filled 2022-06-04 (×4): qty 1

## 2022-06-04 MED ORDER — SODIUM CHLORIDE 0.9 % IV SOLN: INTRAVENOUS | Status: DC

## 2022-06-04 MED ORDER — VANCOMYCIN HCL 1500 MG/300ML IV SOLN
1500.0000 mg | INTRAVENOUS | Status: DC
  Administered 2022-06-05: 1500 mg via INTRAVENOUS
  Filled 2022-06-04: qty 300

## 2022-06-04 MED ORDER — ONDANSETRON HCL 4 MG PO TABS: 4.0000 mg | ORAL_TABLET | Freq: Four times a day (QID) | ORAL | Status: DC | PRN

## 2022-06-04 MED ORDER — LISINOPRIL-HYDROCHLOROTHIAZIDE 20-12.5 MG PO TABS: 1.0000 | ORAL_TABLET | Freq: Every day | ORAL | Status: DC

## 2022-06-04 MED ORDER — ENOXAPARIN SODIUM 60 MG/0.6ML IJ SOSY
60.0000 mg | PREFILLED_SYRINGE | INTRAMUSCULAR | Status: DC
  Administered 2022-06-06 – 2022-06-08 (×3): 60 mg via SUBCUTANEOUS
  Filled 2022-06-04 (×4): qty 0.6

## 2022-06-04 MED ORDER — ACETAMINOPHEN 325 MG PO TABS
650.0000 mg | ORAL_TABLET | Freq: Once | ORAL | Status: AC | PRN
  Administered 2022-06-04: 650 mg via ORAL
  Filled 2022-06-04: qty 2

## 2022-06-04 MED ORDER — CEFAZOLIN SODIUM-DEXTROSE 2-4 GM/100ML-% IV SOLN: 2.0000 g | Freq: Three times a day (TID) | INTRAVENOUS | Status: DC

## 2022-06-04 MED ORDER — ZOLPIDEM TARTRATE 5 MG PO TABS
5.0000 mg | ORAL_TABLET | Freq: Every day | ORAL | Status: DC
  Administered 2022-06-04 – 2022-06-07 (×4): 5 mg via ORAL
  Filled 2022-06-04 (×4): qty 1

## 2022-06-04 MED ORDER — METRONIDAZOLE 500 MG/100ML IV SOLN
500.0000 mg | Freq: Once | INTRAVENOUS | Status: AC
  Administered 2022-06-04: 500 mg via INTRAVENOUS
  Filled 2022-06-04: qty 100

## 2022-06-04 MED ORDER — ACETAMINOPHEN 650 MG RE SUPP: 650.0000 mg | Freq: Four times a day (QID) | RECTAL | Status: DC | PRN

## 2022-06-04 MED ORDER — CYCLOBENZAPRINE HCL 10 MG PO TABS
10.0000 mg | ORAL_TABLET | Freq: Every evening | ORAL | Status: DC | PRN
  Administered 2022-06-06 – 2022-06-07 (×2): 10 mg via ORAL
  Filled 2022-06-04 (×2): qty 1

## 2022-06-04 MED ORDER — HYDROCHLOROTHIAZIDE 12.5 MG PO TABS: 12.5000 mg | ORAL_TABLET | Freq: Every day | ORAL | Status: DC

## 2022-06-04 MED ORDER — PANTOPRAZOLE SODIUM 40 MG PO TBEC
40.0000 mg | DELAYED_RELEASE_TABLET | Freq: Every day | ORAL | Status: DC
  Administered 2022-06-05 – 2022-06-08 (×4): 40 mg via ORAL
  Filled 2022-06-04 (×4): qty 1

## 2022-06-04 MED ORDER — TAMSULOSIN HCL 0.4 MG PO CAPS
0.4000 mg | ORAL_CAPSULE | Freq: Every day | ORAL | Status: DC
  Administered 2022-06-05 – 2022-06-08 (×4): 0.4 mg via ORAL
  Filled 2022-06-04 (×4): qty 1

## 2022-06-04 MED ORDER — LISINOPRIL 10 MG PO TABS: 20.0000 mg | ORAL_TABLET | Freq: Every day | ORAL | Status: DC

## 2022-06-04 MED ORDER — SODIUM CHLORIDE 0.9 % IV BOLUS (SEPSIS)
1000.0000 mL | Freq: Once | INTRAVENOUS | Status: AC
  Administered 2022-06-04: 1000 mL via INTRAVENOUS

## 2022-06-04 MED ORDER — LACTATED RINGERS IV SOLN: INTRAVENOUS | Status: DC

## 2022-06-04 MED ORDER — SODIUM CHLORIDE 0.9 % IV SOLN
2.0000 g | Freq: Once | INTRAVENOUS | Status: AC
  Administered 2022-06-04: 2 g via INTRAVENOUS
  Filled 2022-06-04: qty 12.5

## 2022-06-04 NOTE — Progress Notes (Signed)
Pharmacy Antibiotic Note  Marvin Carr is a 66 y.o. male admitted on 06/04/2022 with  unknown source .  Pharmacy has been consulted for vancomycin and cefepime dosing.  Plan: Vancomycin 2000 mg IV x 1 Vancomycin 1500 mg IV every 24 hours Cefepime 2000 mg IV every 8 hours     Temp (24hrs), Avg:102.8 F (39.3 C), Min:102.8 F (39.3 C), Max:102.8 F (39.3 C)  Recent Labs  Lab 05/30/22 1312  WBC 6.6  CREATININE 1.42*    Estimated Creatinine Clearance: 68.5 mL/min (A) (by C-G formula based on SCr of 1.42 mg/dL (H)).    No Known Allergies  Antimicrobials this admission: Cefepime 9/24 >>  Vanco 9/24 >>  Flagyl 9/24   Microbiology results: 9/24 BCx: pending 9/24 UCx: pending    Thank you for allowing pharmacy to be a part of this patient's care.  Ramond Craver 06/04/2022 7:47 PM

## 2022-06-04 NOTE — Assessment & Plan Note (Signed)
Hold metformin and Rybelsus Maintain consistent carbohydrate diet Glycemic control with sliding scale insulin

## 2022-06-04 NOTE — Assessment & Plan Note (Signed)
Continue bupropion and fluoxetine

## 2022-06-04 NOTE — Assessment & Plan Note (Signed)
BMI 75.30 Complicates overall prognosis and care

## 2022-06-04 NOTE — ED Triage Notes (Signed)
Pt to the ED with complaints of a fever after having received chemotherapy on last Wednesday.  Pt states his highest fever at home was 102.7.  Pt states he last had OTC meds about 4 hours ago.

## 2022-06-04 NOTE — ED Provider Notes (Signed)
Emergency Department Provider Note   I have reviewed the triage vital signs and the nursing notes.   HISTORY  Chief Complaint Fever   HPI Marvin Carr is a 66 y.o. 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  He has been on chemotherapy since January with last treatment on Wednesday of this past week.  He states been feeling overall well but for the past 3 days has developed some mild cough/congestion and had high fever at home with Tmax of 102.7 F.  He took Tylenol 4 hours prior.  Denies chest pain or specific shortness of breath.  No severe headaches.  No abdominal pain, vomiting, diarrhea.  No rash.  He has had bilateral lower extremity swelling and has been using compression stockings but was advised to not take Lasix as his hematology team has been following his kidney function, according to patient.    Past Medical History:  Diagnosis Date   Anxiety    Bladder cancer ([place]) 09/11/2009   CAD (coronary artery disease), native coronary artery    a. Mildly elevated troponin 03/2013, cath with nonobstructive disease including 50% AV groove distal stenosis before large OM   Essential hypertension    Headache(784.0)    History of migraines   Hyperglycemia    Mixed hyperlipidemia    Neuropathy    Obesity    Port-A-Cath in place 09/30/2021   Pre-diabetes    Seasonal allergies    Sleep apnea    On CPAP    Review of Systems  Constitutional: Positive fever/chills Cardiovascular: Denies chest pain. Respiratory: Denies shortness of breath. Positive cough.  Gastrointestinal: No abdominal pain.  No nausea, no vomiting.  No diarrhea.  No constipation. Genitourinary: Negative for dysuria. Musculoskeletal: Negative for back pain. Skin: Negative for rash. Neurological: Negative for headaches, focal weakness or  numbness.  ____________________________________________   PHYSICAL EXAM:  VITAL SIGNS: ED Triage Vitals [06/04/22 1855]  Enc Vitals Group     BP (!) 162/70     Pulse Rate (!) 110     Resp (!) 22     Temp (!) 102.8 F (39.3 C)     Temp Source Oral     SpO2 98 %   Constitutional: Alert and oriented. Well appearing and in no acute distress. Eyes: Conjunctivae are normal.  Head: Atraumatic. Nose: No congestion/rhinnorhea. Mouth/Throat: Mucous membranes are moist.  Neck: No stridor. Cardiovascular: Tachycardia. Good peripheral circulation. Grossly normal heart sounds.   Respiratory: Normal respiratory effort.  No retractions. Lungs CTAB. Gastrointestinal: Soft and nontender. No distention.  Musculoskeletal: No lower extremity tenderness with 2+ pitting edema in the bilateral LEs. No gross deformities of extremities. Neurologic:  Normal speech and language. No gross focal neurologic deficits are appreciated.  Skin:  Skin is warm, dry and intact. No rash noted.  ____________________________________________   LABS (all labs ordered are listed, but only abnormal results are displayed)  Labs Reviewed  COMPREHENSIVE METABOLIC PANEL - Abnormal; Notable for the following components:      Result Value   Sodium 131 (*)    Glucose, Bld 142 (*)    BUN 28 (*)    Creatinine, Ser 1.40 (*)    Calcium 8.4 (*)    Total Protein 6.2 (*)    Albumin 3.1 (*)    GFR, Estimated 55 (*)    All other components within normal limits  CBC WITH DIFFERENTIAL/PLATELET - Abnormal; Notable for the following components:  WBC 13.0 (*)    RBC 2.91 (*)    Hemoglobin 9.5 (*)    HCT 28.6 (*)    Neutro Abs 12.1 (*)    Lymphs Abs 0.5 (*)    Abs Immature Granulocytes 0.08 (*)    All other components within normal limits  APTT - Abnormal; Notable for the following components:   aPTT 38 (*)    All other components within normal limits  RESP PANEL BY RT-PCR (FLU A&B, COVID) ARPGX2  CULTURE, BLOOD (ROUTINE  X 2)  CULTURE, BLOOD (ROUTINE X 2)  URINE CULTURE  LACTIC ACID, PLASMA  PROTIME-INR  LACTIC ACID, PLASMA  URINALYSIS, ROUTINE W REFLEX MICROSCOPIC  CREATININE, SERUM   ____________________________________________  EKG  Rate: 103 PR: 172 QTc: 409.  Sinus tachycardia. Narrow QRS. No ST elevation or depression.    ____________________________________________  RADIOLOGY  DG Chest Port 1 View  Result Date: 06/04/2022 CLINICAL DATA:  Fever, possible sepsis EXAM: PORTABLE CHEST 1 VIEW COMPARISON:  02/28/2022 FINDINGS: Cardiac size is within normal limits. There are no signs of alveolar pulmonary edema. There is slight prominence of interstitial markings in both lungs. There is no focal consolidation. There is no pleural effusion or pneumothorax. Tip of right IJ chest port is seen in superior vena cava. IMPRESSION: No focal pulmonary consolidation is seen. There is no pleural effusion. There is slight prominence of interstitial markings in both lungs. This may be an apparent change due to differences in the techniques or suggest interstitial pneumonia. Electronically Signed   By: Elmer Picker M.D.   On: 06/04/2022 19:55    ____________________________________________   PROCEDURES  Procedure(s) performed:   Procedures  CRITICAL CARE Performed by: Margette Fast Total critical care time: 35 minutes Critical care time was exclusive of separately billable procedures and treating other patients. Critical care was necessary to treat or prevent imminent or life-threatening deterioration. Critical care was time spent personally by me on the following activities: development of treatment plan with patient and/or surrogate as well as nursing, discussions with consultants, evaluation of patient's response to treatment, examination of patient, obtaining history from patient or surrogate, ordering and performing treatments and interventions, ordering and review of laboratory studies,  ordering and review of radiographic studies, pulse oximetry and re-evaluation of patient's condition.  Nanda Quinton, MD Emergency Medicine  ____________________________________________   INITIAL IMPRESSION / ASSESSMENT AND PLAN / ED COURSE  Pertinent labs & imaging results that were available during my care of the patient were reviewed by me and considered in my medical decision making (see chart for details).   This patient is Presenting for Evaluation of fever, which does require a range of treatment options, and is a complaint that involves a high risk of morbidity and mortality.  The Differential Diagnoses include neutropenic fever, viral illness, SIRS, sepsis, etc.  Critical Interventions-    Medications  lactated ringers infusion (has no administration in time range)  metroNIDAZOLE (FLAGYL) IVPB 500 mg (500 mg Intravenous New Bag/Given 06/04/22 2014)  vancomycin (VANCOREADY) IVPB 2000 mg/400 mL (has no administration in time range)  ceFEPIme (MAXIPIME) 2 g in sodium chloride 0.9 % 100 mL IVPB (has no administration in time range)  vancomycin (VANCOREADY) IVPB 1500 mg/300 mL (has no administration in time range)  acetaminophen (TYLENOL) tablet 650 mg (650 mg Oral Given 06/04/22 1902)  sodium chloride 0.9 % bolus 1,000 mL (0 mLs Intravenous Stopped 06/04/22 2036)  ceFEPIme (MAXIPIME) 2 g in sodium chloride 0.9 % 100 mL IVPB (0 g  Intravenous Stopped 06/04/22 2011)    Reassessment after intervention: Patient remains very well appearing.    I did obtain Additional Historical Information from family at bedside.  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    Clinical Laboratory Tests Ordered, included leukocytosis to 13.  No neutropenia.  Lactic acid normal.  Creatinine at baseline of 1.4.  No anion gap.  COVID and flu negative.   Radiologic Tests Ordered, included CXR. I independently  interpreted the images and agree with radiology interpretation.   Cardiac Monitor Tracing which shows sinus tachycardia.    Social Determinants of Health Risk patient is not an active smoker.   Consult complete with Hospitalist. Plan for admit.   Medical Decision Making: Summary:  Patient presents emergency department with fever in the setting of active chemotherapy for cholangiocarcinoma.  He is followed by oncology here at [facility].  He looks very well.  No hypoxemia.  Plan for sepsis work-up, COVID PCR, IV fluids, fever reduction and reassess.  Reevaluation with update and discussion with patient and family at bedside.  Continues to look well.  Legs are erythematous and swollen equally with some perhaps increased streaking/redness in the right lower extremity and some crusty type mild discharge of the right lower leg.  No purulence, crepitus, or other findings to strongly suspect fasciitis or prompt additional imaging. Plan for abx and admit. Patient in agreement.   Disposition: admit  ____________________________________________  FINAL CLINICAL IMPRESSION(S) / ED DIAGNOSES  Final diagnoses:  SIRS (systemic inflammatory response syndrome) (HCC)  Peripheral edema    Note:  This document was prepared using Dragon voice recognition software and may include unintentional dictation errors.  Nanda Quinton, MD, [facility] Emergency Medicine    Kyrian Stage, Wonda Olds, MD 06/04/22 2111

## 2022-06-04 NOTE — Assessment & Plan Note (Signed)
Most likely related to ongoing chemotherapy Continue gabapentin

## 2022-06-04 NOTE — Assessment & Plan Note (Signed)
Continue aspirin.  Nitroglycerin

## 2022-06-04 NOTE — ED Notes (Signed)
Blood cultures and lab work drawn before antibiotics started

## 2022-06-04 NOTE — H&P (Signed)
History and Physical    Patient: Marvin Carr [id number] DOB: 05-08-1956 DOA: 06/04/2022 DOS: the patient was seen and examined on 06/04/2022 PCP: Celene Squibb, MD  Patient coming from: Home  Chief Complaint:  Chief Complaint  Patient presents with   Fever   HPI: Marvin Carr is a 66 y.o. 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 Patient's last chemotherapy was on 05/30/22 which he tolerated and had done well until 3 days prior to his admission when he developed a fever with a Tmax of 102.  He has a nonproductive cough as well as bilateral lower extremity swelling with some redness (Rt > Lt).  Patient states that he has had issues with lower extremity swelling for several weeks and has been wearing compression socks to help with the swelling.  He also notes that he has had redness for about 3 weeks but does not think it is any worse.  He had lower extremity venous Dopplers done about 2 weeks ago which were normal.   He denies having any nasal congestion, no shortness of breath, no nausea, no vomiting, no abdominal pain, no urinary symptoms, no chest pain, no dizziness, no lightheadedness, no headache, no blurred vision no focal deficit. Labs show a white count of 13,000. Tmax upon arrival to the ER was 102.8 Patient received IV vancomycin and cefepime and will be admitted to the hospital for further evaluation      Review of Systems: As mentioned in the history of present illness. All other systems reviewed and are negative. Past Medical History:  Diagnosis Date   Anxiety    Bladder cancer ([place]) 09/11/2009   CAD (coronary artery disease), native coronary artery    a. Mildly elevated troponin 03/2013, cath with  nonobstructive disease including 50% AV groove distal stenosis before large OM   Essential hypertension    Headache(784.0)    History of migraines   Hyperglycemia    Mixed hyperlipidemia    Neuropathy    Obesity    Port-A-Cath in place 09/30/2021   Pre-diabetes    Seasonal allergies    Sleep apnea    On CPAP   Past Surgical History:  Procedure Laterality Date   BIOPSY  09/23/2021   Procedure: BIOPSY;  Surgeon: Harvel Quale, MD;  [facility];  Service: Gastroenterology;;   COLONOSCOPY  06/28/2011   Procedure: COLONOSCOPY;  Surgeon: Rogene Houston, MD;  Location: [facility];  Service: Endoscopy;  Laterality: N/A;   COLONOSCOPY WITH PROPOFOL N/A 09/23/2021   Procedure: COLONOSCOPY WITH PROPOFOL;  Surgeon: Harvel Quale, MD;  Location: [facility];  Service: Gastroenterology;  Laterality: N/A;  940   ESOPHAGOGASTRODUODENOSCOPY (EGD) WITH PROPOFOL N/A 09/23/2021   Procedure: ESOPHAGOGASTRODUODENOSCOPY (EGD) WITH PROPOFOL;  Surgeon: Harvel Quale, MD;  Location: [facility];  Service: Gastroenterology;  Laterality: N/A;   IR IMAGING GUIDED PORT INSERTION  09/28/2021   IR PARACENTESIS  09/28/2021   JOINT REPLACEMENT Right    hip   LEFT HEART CATHETERIZATION WITH CORONARY ANGIOGRAM N/A 03/31/2013   Procedure: LEFT HEART CATHETERIZATION WITH CORONARY ANGIOGRAM;  Surgeon: Minus Breeding, MD;  Location: [facility] CATH LAB;  Service: Cardiovascular;  Laterality: N/A;   POLYPECTOMY  09/23/2021   Procedure: POLYPECTOMY;  Surgeon: Harvel Quale, MD;  Location: [facility]  END[facility];  Service: Gastroenterology;;   TURBT  09/11/2009   Social History:  reports that he quit smoking about 10 years ago. His smoking use included cigarettes. He has a 5.00 pack-year smoking history. He has never used smokeless tobacco. He reports current alcohol use. He reports that he does not use drugs.  No Known Allergies  Family History  Problem Relation Age of Onset    COPD Father     Prior to Admission medications   Medication Sig Start Date End Date Taking? Authorizing Provider  amLODipine (NORVASC) 5 MG tablet Take 5 mg by mouth daily. 02/21/22   [provider]  aspirin EC 81 MG tablet Take 81 mg by mouth daily.    [provider]  buPROPion (WELLBUTRIN XL) 300 MG 24 hr tablet Take 300 mg by mouth daily. 03/18/09   [provider]  CISPLATIN IV Inject into the vein once a week. Day 1, Day 8 q 21 days Every other week. 10/05/21   [provider]  cyclobenzaprine (FLEXERIL) 10 MG tablet Take 10 mg by mouth at bedtime as needed for muscle spasms.    [provider]  Durvalumab (IMFINZI IV) Inject into the vein every 21 ( twenty-one) days. 10/05/21   [provider]  Elastic Bandages & Supports (ELASTIC BANDAGE 6") MISC Use compression wraps during the day for leg swelling 05/19/22   Tarri Abernethy M, PA-C  Eszopiclone 3 MG TABS Take 3 mg by mouth at bedtime. Take immediately before bedtime    [provider]  famotidine (PEPCID) 20 MG tablet TAKE (1) TABLET BY MOUTH AT BEDTIME. 04/04/22   Pennington, Rebekah M, PA-C  fenofibrate 160 MG tablet Take 160 mg by mouth daily. 11/05/19   [provider]  FLUoxetine (PROZAC) 10 MG capsule Take 10 mg by mouth daily. 11/01/21   [provider]  furosemide (LASIX) 40 MG tablet Take 1 tablet (40 mg total) by mouth daily. 05/17/22   Derek Jack, MD  gabapentin (NEURONTIN) 300 MG capsule Take 1 capsule (300 mg total) by mouth 3 (three) times daily. 05/25/22   Harriett Rush, PA-C  Gauze Pads & Dressings (ABDOMINAL PAD) 8"X10" PADS Use for leaking fluid from leg swelling 05/19/22   Tarri Abernethy M, PA-C  Gemcitabine HCl (GEMZAR IV) Inject into the vein once a week. Day 1, Day 8 q 21 days Every other week 10/05/21   [provider]  lidocaine (XYLOCAINE) 2 % solution 5 mLs 4 (four) times daily as needed. 03/16/22   [provider]  lisinopril-hydrochlorothiazide (ZESTORETIC) 20-12.5 MG tablet Take 1 tablet by mouth daily. 04/20/10   [provider]  magnesium oxide (MAG-OX) 400 (240 Mg) MG tablet Take 1 tablet (400 mg total) by mouth 2 (two) times daily. Patient taking differently: Take 400 mg by mouth 3 (three) times daily. 11/16/21   Derek Jack, MD  metFORMIN (GLUCOPHAGE-XR) 500 MG 24 hr tablet Take 500 mg by mouth 2 (two) times daily. 08/16/21   [provider]  nitroGLYCERIN (NITROSTAT) 0.4 MG SL tablet Place 1 tablet (0.4 mg total) under the tongue every 5 (five) minutes as needed for chest pain. 09/26/18   Satira Sark, MD  olmesartan (BENICAR) 40 MG tablet Take 1 tablet (40 mg total) by mouth daily. 02/15/22   Derek Jack, MD  omeprazole (PRILOSEC) 40 MG capsule Take 40 mg by mouth daily. 11/01/21   [provider]  pantoprazole (PROTONIX) 40 MG tablet Take 1 tablet (40 mg  total) by mouth daily. 10/10/21   Derek Jack, MD  pramipexole (MIRAPEX) 0.75 MG tablet Take 1 tablet (0.75 mg total) by mouth 3 (three) times daily. 12/28/21   Derek Jack, MD  RYBELSUS 7 MG TABS Take 14 mg by mouth daily. 08/16/21   [provider]  sucralfate (CARAFATE) 1 GM/10ML suspension Take 10 mLs (1 g total) by mouth 4 (four) times daily. Swish and swallow 4 times daily as needed for mouth and throat irritation 03/15/22   Derek Jack, MD  tamsulosin (FLOMAX) 0.4 MG CAPS capsule Take 0.4 mg by mouth daily. 08/29/21   [provider]  traMADol (ULTRAM) 50 MG tablet Take 50 mg by mouth 4 (four) times daily as needed. 10/13/21   [provider]  zaleplon (SONATA) 10 MG capsule Take 1 capsule (10 mg total) by mouth at bedtime as needed for sleep. 05/03/22   Derek Jack, MD  prochlorperazine (COMPAZINE) 10 MG tablet Take 1 tablet (10 mg total) by mouth every 6 (six) hours as needed (Nausea or vomiting). 09/30/21 05/16/22  Derek Jack, MD    Physical Exam: Vitals:   06/04/22 1930 06/04/22 1940 06/04/22 2000 06/04/22 2045  BP: (!) 115/51  (!) 108/45 (!) 107/53  Pulse: (!) 103 (!) 103 (!) 102 96  Resp: (!) 32 _0 Temp:   (!) 101.9 F (38.8 C)   TempSrc:      SpO2: 93% 94% 94% 98%   Physical Exam Vitals and nursing note reviewed.  Constitutional:      Appearance: Normal appearance.  HENT:     Head: Normocephalic and atraumatic.     Nose: Nose normal.     Mouth/Throat:     Mouth: Mucous membranes are moist.  Eyes:     Pupils: Pupils are equal, round, and reactive to light.  Cardiovascular:     Rate and Rhythm: Tachycardia present.  Pulmonary:     Effort: Pulmonary effort is normal.     Breath sounds: Normal breath sounds.  Abdominal:     General: Bowel sounds are normal.     Palpations: Abdomen is soft.     Comments: Central adiposity  Musculoskeletal:        General: Normal range of motion.     Cervical back: Normal range of motion and neck supple.     Right lower leg: Edema present.     Left lower leg: Edema present.  Skin:    Findings: Erythema present.     Comments: Redness involving both lower extremities (Rt > Lt)  Neurological:     General: No focal deficit present.     Mental Status: He is alert.  Psychiatric:        Mood and Affect: Mood normal.        Behavior: Behavior normal.     Data Reviewed: Labs reviewed.  Lactic acid 0.9, sodium 131, potassium 4.2, chloride 102, bicarb 22, glucose 142, BUN 28, creatinine 1.40, calcium 8.4, total protein 6.2, albumin 3.1, AST 23, ALT 20, alk phos 50, PT 14.3, INR 1.1, white count 13.0, hemoglobin 9.5, hematocrit 28.6, platelet count 205 Respiratory viral panel is negative Chest x-ray reviewed by me shows No focal pulmonary consolidation is seen. There is no pleural effusion.There is slight prominence of interstitial markings in both lungs.This may be an apparent change due to differences in the techniques or suggest interstitial  pneumonia. Twelve-lead EKG reviewed by me shows sinus tachycardia Lower extremity venous Doppler (09/11) shows no evidence of deep  venous thrombosis in either lower extremity There are no new results to review at this time.  Assessment and Plan: Sepsis due to cellulitis ([place]) As evidenced by fever with a Tmax of 101.9, tachycardia, tachypnea, leukocytosis and bilateral lower extremity cellulitis. Patient has a normal lactic acid level Aggressive IV fluid resuscitation Continue empiric antibiotic therapy with vancomycin and cefepime Patient had venous Dopplers done on 09/11 which were negative for bilateral lower extremity DVT Follow-up results of blood cultures  Pre-diabetes Hold metformin and Rybelsus Maintain consistent carbohydrate diet Glycemic control with sliding scale insulin  Depression Continue bupropion and fluoxetine  Obesity BMI 12.52 Complicates overall prognosis and care  Cholangiocarcinoma metastatic to liver [facility]) Patient has a history of cholangiocarcinoma metastatic to liver as well as peritoneal carcinomatosis. Follow-up with oncology for chemotherapy as an outpatient  Neuropathy Most likely related to ongoing chemotherapy Continue gabapentin  Essential hypertension Patient is normotensive Hold amlodipine, hydrochlorothiazide and lisinopril for now  CAD (coronary artery disease), native coronary artery Continue aspirin.  Nitroglycerin      Advance Care Planning:   Code Status: Full Code   Consults: None  Family Communication: Greater than 50% of time was spent discussing patient's condition and plan of care with him at the bedside.  All questions and concerns have been addressed.  He verbalizes understanding and agrees with the plan.  Severity of Illness: The appropriate patient status for this patient is INPATIENT. Inpatient status is judged to be reasonable and necessary in order to provide the required intensity of service to ensure the  patient's safety. The patient's presenting symptoms, physical exam findings, and initial radiographic and laboratory data in the context of their chronic comorbidities is felt to place them at high risk for further clinical deterioration. Furthermore, it is not anticipated that the patient will be medically stable for discharge from the hospital within 2 midnights of admission.   * I certify that at the point of admission it is my clinical judgment that the patient will require inpatient hospital care spanning beyond 2 midnights from the point of admission due to high intensity of service, high risk for further deterioration and high frequency of surveillance required.*  Author: Collier Bullock, MD 06/04/2022 9:43 PM  For on call review [email or web address].

## 2022-06-04 NOTE — ED Notes (Signed)
Pt given bagged lunch.

## 2022-06-04 NOTE — Assessment & Plan Note (Signed)
As evidenced by fever with a Tmax of 101.9, tachycardia, tachypnea, leukocytosis and bilateral lower extremity cellulitis. Patient has a normal lactic acid level Aggressive IV fluid resuscitation Continue empiric antibiotic therapy with vancomycin and cefepime Patient had venous Dopplers done on 09/11 which were negative for bilateral lower extremity DVT Follow-up results of blood cultures

## 2022-06-04 NOTE — Assessment & Plan Note (Signed)
Patient has a history of cholangiocarcinoma metastatic to liver as well as peritoneal carcinomatosis. Follow-up with oncology for chemotherapy as an outpatient

## 2022-06-04 NOTE — Assessment & Plan Note (Signed)
Patient is normotensive Hold amlodipine, hydrochlorothiazide and lisinopril for now

## 2022-06-05 ENCOUNTER — Inpatient Hospital Stay (HOSPITAL_COMMUNITY): Payer: Medicare Other

## 2022-06-05 ENCOUNTER — Encounter (HOSPITAL_COMMUNITY): Payer: Self-pay | Admitting: Internal Medicine

## 2022-06-05 ENCOUNTER — Inpatient Hospital Stay: Payer: Medicare Other

## 2022-06-05 ENCOUNTER — Inpatient Hospital Stay: Payer: Medicare Other | Admitting: Hematology

## 2022-06-05 DIAGNOSIS — A419 Sepsis, unspecified organism: Secondary | ICD-10-CM | POA: Diagnosis not present

## 2022-06-05 DIAGNOSIS — L0390 Cellulitis, unspecified: Secondary | ICD-10-CM | POA: Diagnosis not present

## 2022-06-05 DIAGNOSIS — I509 Heart failure, unspecified: Secondary | ICD-10-CM | POA: Diagnosis not present

## 2022-06-05 LAB — CBC
HCT: 29.3 % — ABNORMAL LOW (ref 39.0–52.0)
Hemoglobin: 9.3 g/dL — ABNORMAL LOW (ref 13.0–17.0)
MCH: 32.4 pg (ref 26.0–34.0)
MCHC: 31.7 g/dL (ref 30.0–36.0)
MCV: 102.1 fL — ABNORMAL HIGH (ref 80.0–100.0)
Platelets: 172 10*3/uL (ref 150–400)
RBC: 2.87 MIL/uL — ABNORMAL LOW (ref 4.22–5.81)
RDW: 13.5 % (ref 11.5–15.5)
WBC: 9.4 10*3/uL (ref 4.0–10.5)
nRBC: 0 % (ref 0.0–0.2)

## 2022-06-05 LAB — ECHOCARDIOGRAM COMPLETE
AR max vel: 2.77 cm2
AV Area VTI: 3 cm2
AV Area mean vel: 2.69 cm2
AV Mean grad: 4 mmHg
AV Peak grad: 7.2 mmHg
Ao pk vel: 1.34 m/s
Area-P 1/2: 3.68 cm2
Height: 72 in
MV VTI: 2.35 cm2
S' Lateral: 3.3 cm
Weight: 4366.87 oz

## 2022-06-05 LAB — BASIC METABOLIC PANEL
Anion gap: 7 (ref 5–15)
BUN: 25 mg/dL — ABNORMAL HIGH (ref 8–23)
CO2: 24 mmol/L (ref 22–32)
Calcium: 8.2 mg/dL — ABNORMAL LOW (ref 8.9–10.3)
Chloride: 104 mmol/L (ref 98–111)
Creatinine, Ser: 1.32 mg/dL — ABNORMAL HIGH (ref 0.61–1.24)
GFR, Estimated: 59 mL/min — ABNORMAL LOW (ref >60)
Glucose, Bld: 102 mg/dL — ABNORMAL HIGH (ref 70–99)
Potassium: 3.7 mmol/L (ref 3.5–5.1)
Sodium: 135 mmol/L (ref 135–145)

## 2022-06-05 LAB — GLUCOSE, CAPILLARY
Glucose-Capillary: 116 mg/dL — ABNORMAL HIGH (ref 70–99)
Glucose-Capillary: 126 mg/dL — ABNORMAL HIGH (ref 70–99)
Glucose-Capillary: 110 mg/dL — ABNORMAL HIGH (ref 70–99)

## 2022-06-05 LAB — HEMOGLOBIN A1C
Hgb A1c MFr Bld: 5.1 % (ref 4.8–5.6)
Mean Plasma Glucose: 99.67 mg/dL

## 2022-06-05 LAB — MRSA NEXT GEN BY PCR, NASAL: MRSA by PCR Next Gen: NOT DETECTED

## 2022-06-05 LAB — HIV ANTIBODY (ROUTINE TESTING W REFLEX): HIV Screen 4th Generation wRfx: NONREACTIVE

## 2022-06-05 MED ORDER — SODIUM CHLORIDE 0.9 % IV BOLUS
500.0000 mL | Freq: Once | INTRAVENOUS | Status: AC
  Administered 2022-06-05: 500 mL via INTRAVENOUS

## 2022-06-05 MED ORDER — FUROSEMIDE 10 MG/ML IJ SOLN
40.0000 mg | Freq: Once | INTRAMUSCULAR | Status: AC
  Administered 2022-06-05: 40 mg via INTRAVENOUS
  Filled 2022-06-05: qty 4

## 2022-06-05 MED ORDER — PNEUMOCOCCAL 20-VAL CONJ VACC 0.5 ML IM SUSY
0.5000 mL | PREFILLED_SYRINGE | INTRAMUSCULAR | Status: AC
  Administered 2022-06-06: 0.5 mL via INTRAMUSCULAR
  Filled 2022-06-05: qty 0.5

## 2022-06-05 NOTE — Progress Notes (Signed)
*  PRELIMINARY RESULTS* Echocardiogram 2D Echocardiogram has been performed.  Marvin Carr 06/05/2022, 9:28 AM

## 2022-06-05 NOTE — TOC Initial Note (Signed)
Transition of Care [facility]) - Initial/Assessment Note    Patient Details  Name: Marvin Carr MRN: [id number] Date of Birth: May 19, 1956  Transition of [facility]) CM/SW Contact:    Salome Arnt, [place] Phone Number: 06/05/2022, 8:39 AM  Clinical Narrative: Pt admitted with sepsis due to cellulitis. Assessment completed due to high risk readmission score. Pt reports he lives alone and manages well. No home health prior to admission. Pt drives himself to appointments. He reports no needs at this time. TOC will continue to follow.                   Expected Discharge Plan: Home/Self Care Barriers to Discharge: Continued Medical Work up   Patient Goals and CMS Choice Patient states their goals for this hospitalization and ongoing recovery are:: return home   Choice offered to / list presented to : Patient  Expected Discharge Plan and Services Expected Discharge Plan: Home/Self Care In-house Referral: Clinical Social Work     Living arrangements for the past 2 months: Single Family Home                                      Prior Living Arrangements/Services Living arrangements for the past 2 months: Single Family Home Lives with:: Self Patient language and need for interpreter reviewed:: Yes Do you feel safe going back to the place where you live?: Yes      Need for Family Participation in Patient Care: No (Comment)     Criminal Activity/Legal Involvement Pertinent to Current Situation/Hospitalization: No - Comment as needed  Activities of Daily Living Home Assistive Devices/Equipment: Cane (specify quad or straight) ADL Screening (condition at time of admission) Patient's cognitive ability adequate to safely complete daily activities?: Yes Is the patient deaf or have difficulty hearing?: No Does the patient have difficulty seeing, even when wearing glasses/contacts?: No Does the patient have difficulty concentrating, remembering, or making decisions?:  Yes Patient able to express need for assistance with ADLs?: Yes Does the patient have difficulty dressing or bathing?: Yes Independently performs ADLs?: Yes (appropriate for developmental age) Does the patient have difficulty walking or climbing stairs?: Yes Weakness of Legs: Both Weakness of Arms/Hands: None  Permission Sought/Granted                  Emotional Assessment     Affect (typically observed): Appropriate Orientation: : Oriented to Self, Oriented to Place, Oriented to  Time, Oriented to Situation Alcohol / Substance Use: Not Applicable Psych Involvement: No (comment)  Admission diagnosis:  Peripheral edema [R60.9] Fever [R50.9] SIRS (systemic inflammatory response syndrome) ([place]) [R65.10] Patient Active Problem List   Diagnosis Date Noted   Fever 06/04/2022   Sepsis due to cellulitis ([place]) 06/04/2022   Obesity    Depression    Pre-diabetes    Forgetfulness 03/29/2022   Port-A-Cath in place 09/30/2021   Cholangiocarcinoma metastatic to liver ([place]) 09/26/2021   Lesion of liver 09/19/2021   Peritoneal lesion 09/19/2021   Constipation 09/19/2021   Peritoneal carcinomatosis ([place]) 09/19/2021   Degeneration of intervertebral disc of lumbar region 09/01/2021   Neuropathy    CAD (coronary artery disease), native coronary artery 03/30/2013   Essential hypertension 03/30/2013   Mixed hyperlipidemia 03/30/2013   PCP:  Celene Squibb, MD Pharmacy:   [place], [place] - [place] [address] [place] [address] Phone: [phone] Fax: [phone]  [place], [address] [place] [address] Phone: [phone] Fax: [phone]     Social Determinants of Health (SDOH) Interventions    Readmission Risk Interventions    06/05/2022    8:38 AM  Readmission Risk Prevention Plan  Transportation Screening Complete  Home Care Screening Complete   Medication Review (RN CM) Complete

## 2022-06-05 NOTE — Progress Notes (Signed)
PROGRESS NOTE    Marvin Carr  TKZ:[id number] DOB: [birth year] DOA: 06/04/2022 PCP: Celene Squibb, MD   Brief Narrative:    Marvin Carr is a 66 y.o. 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  He was admitted for sepsis, present on admission secondary to bilateral lower extremity cellulitis.  He is noted to have a recent 25-30 pound weight gain with swelling to his lower extremities.  Assessment & Plan:   Active Problems:   Sepsis due to cellulitis (HCC)   CAD (coronary artery disease), native coronary artery   Essential hypertension   Neuropathy   Cholangiocarcinoma metastatic to liver (HCC)   Obesity   Depression   Pre-diabetes  Assessment and Plan:   Sepsis, present on admission due to bilateral lower extremity cellulitis ([place]) As evidenced by fever with a Tmax of 101.9, tachycardia, tachypnea, leukocytosis and bilateral lower extremity cellulitis. Patient has a normal lactic acid level Hold further IV fluid Continue empiric antibiotic therapy with vancomycin and cefepime, check MRSA PCR Patient had venous Dopplers done on 09/11 which were negative for bilateral lower extremity DVT Follow-up results of blood cultures   Pre-diabetes Hold metformin and Rybelsus Maintain consistent carbohydrate diet Glycemic control with sliding scale insulin   Depression Continue bupropion and fluoxetine   Obesity BMI 57.32 Complicates overall prognosis and care   Cholangiocarcinoma metastatic to liver [facility]) Patient has a history of cholangiocarcinoma metastatic to liver as well as peritoneal carcinomatosis. Follow-up with oncology for chemotherapy as an outpatient   Neuropathy Most likely related to ongoing  chemotherapy Continue gabapentin   Essential hypertension Patient is normotensive Hold amlodipine, hydrochlorothiazide and lisinopril for now Monitor with IV Lasix  Volume overload Check 2D echocardiogram IV Lasix Strict ins and outs and daily weights Repeat labs   CAD (coronary artery disease), native coronary artery Continue aspirin.  Nitroglycerin    DVT prophylaxis: Lovenox Code Status: Full Family Communication: None at bedside Disposition Plan:  Status is: Inpatient Remains inpatient appropriate because: Need for IV medications.   Consultants:  None  Procedures:  None  Antimicrobials:  Anti-infectives (From admission, onward)    Start     Dose/Rate Route Frequency Ordered Stop   06/05/22 2100  vancomycin (VANCOREADY) IVPB 1500 mg/300 mL        1,500 mg 150 mL/hr over 120 Minutes Intravenous Every 24 hours 06/04/22 1946     06/05/22 0400  ceFEPIme (MAXIPIME) 2 g in sodium chloride 0.9 % 100 mL IVPB        2 g 200 mL/hr over 30 Minutes Intravenous Every 8 hours 06/04/22 1944     06/04/22 2200  ceFAZolin (ANCEF) IVPB 2g/100 mL premix  Status:  Discontinued        2 g 200 mL/hr over 30 Minutes Intravenous Every 8 hours 06/04/22 2126 06/04/22 2127   06/04/22 1945  vancomycin (VANCOREADY) IVPB 2000 mg/400 mL        2,000 mg 200 mL/hr over 120 Minutes Intravenous  Once 06/04/22 1943 06/04/22 2318   06/04/22 1930  ceFEPIme (MAXIPIME) 2 g in sodium chloride 0.9 % 100 mL IVPB        2 g 200 mL/hr over 30 Minutes Intravenous  Once 06/04/22 1919 06/04/22 2011   06/04/22 1930  metroNIDAZOLE (FLAGYL) IVPB 500 mg  500 mg 100 mL/hr over 60 Minutes Intravenous  Once 06/04/22 1919 06/04/22 2114   06/04/22 1930  vancomycin (VANCOCIN) IVPB 1000 mg/200 mL premix  Status:  Discontinued        1,000 mg 200 mL/hr over 60 Minutes Intravenous  Once 06/04/22 1919 06/04/22 1943       Subjective: Patient seen and evaluated today with no new acute complaints or concerns. No  acute concerns or events noted overnight.  Objective: Vitals:   06/05/22 0400 06/05/22 0430 06/05/22 0530 06/05/22 0823  BP: (!) 97/55 106/65 (!) 152/75   Pulse: 78 79 80   Resp:   20   Temp:   (!) 96.9 F (36.1 C)   TempSrc:      SpO2: 100% 98% 100%   Weight:    123.8 kg  Height:    6' (1.829 m)    Intake/Output Summary (Last 24 hours) at 06/05/2022 0845 Last data filed at 06/05/2022 0301 Gross per 24 hour  Intake 2089.07 ml  Output --  Net 2089.07 ml   Filed Weights   06/05/22 0823  Weight: 123.8 kg    Examination:  General exam: Appears calm and comfortable  Respiratory system: Clear to auscultation. Respiratory effort normal. Cardiovascular system: S1 & S2 heard, RRR.  Gastrointestinal system: Abdomen is soft Central nervous system: Alert and awake Extremities: Pitting edema bilaterally with erythema and warmth Skin: No significant lesions noted Psychiatry: Flat affect.    Data Reviewed: I have personally reviewed following labs and imaging studies  CBC: Recent Labs  Lab 05/30/22 1312 06/04/22 1919 06/05/22 0602  WBC 6.6 13.0* 9.4  NEUTROABS 4.4 12.1*  --   HGB 10.2* 9.5* 9.3*  HCT 30.6* 28.6* 29.3*  MCV 98.7 98.3 102.1*  PLT 255 205 573   Basic Metabolic Panel: Recent Labs  Lab 05/30/22 1312 06/04/22 1919 06/05/22 0602  NA 138 131* 135  K 3.7 4.2 3.7  CL 104 102 104  CO2 '25 22 24  '$ GLUCOSE 147* 142* 102*  BUN 31* 28* 25*  CREATININE 1.42* 1.40* 1.32*  CALCIUM 9.2 8.4* 8.2*  MG 1.8  --   --    GFR: Estimated Creatinine Clearance: 74.8 mL/min (A) (by C-G formula based on SCr of 1.32 mg/dL (H)). Liver Function Tests: Recent Labs  Lab 05/30/22 1312 06/04/22 1919  AST 26 23  ALT 25 20  ALKPHOS 69 50  BILITOT 0.6 0.6  PROT 6.6 6.2*  ALBUMIN 3.4* 3.1*   No results for input(s): "LIPASE", "AMYLASE" in the last 168 hours. No results for input(s): "AMMONIA" in the last 168 hours. Coagulation Profile: Recent Labs  Lab 06/04/22 1919   INR 1.1   Cardiac Enzymes: No results for input(s): "CKTOTAL", "CKMB", "CKMBINDEX", "TROPONINI" in the last 168 hours. BNP (last 3 results) No results for input(s): "PROBNP" in the last 8760 hours. HbA1C: No results for input(s): "HGBA1C" in the last 72 hours. CBG: Recent Labs  Lab 06/04/22 2219 06/05/22 0804  GLUCAP 115* 116*   Lipid Profile: No results for input(s): "CHOL", "HDL", "LDLCALC", "TRIG", "CHOLHDL", "LDLDIRECT" in the last 72 hours. Thyroid Function Tests: No results for input(s): "TSH", "T4TOTAL", "FREET4", "T3FREE", "THYROIDAB" in the last 72 hours. Anemia Panel: No results for input(s): "VITAMINB12", "FOLATE", "FERRITIN", "TIBC", "IRON", "RETICCTPCT" in the last 72 hours. Sepsis Labs: Recent Labs  Lab 06/04/22 1919 06/04/22 2107  LATICACIDVEN 1.3 0.9    Recent Results (from the past 240 hour(s))  Blood Culture (routine x 2)  Status: None (Preliminary result)   Collection Time: 06/04/22  7:19 PM   Specimen: BLOOD LEFT WRIST  Result Value Ref Range Status   Specimen Description   Final    BLOOD LEFT WRIST BOTTLES DRAWN AEROBIC AND ANAEROBIC   Special Requests Blood Culture adequate volume  Final   Culture   Final    NO GROWTH < 12 HOURS Performed at [facility], [address], [place], [place] [address]    Report Status PENDING  Incomplete  Blood Culture (routine x 2)     Status: None (Preliminary result)   Collection Time: 06/04/22  7:24 PM   Specimen: BLOOD RIGHT WRIST  Result Value Ref Range Status   Specimen Description   Final    BLOOD RIGHT WRIST BOTTLES DRAWN AEROBIC AND ANAEROBIC   Special Requests Blood Culture adequate volume  Final   Culture   Final    NO GROWTH < 12 HOURS Performed at [facility], [address], [place], [place] [address]    Report Status PENDING  Incomplete  Resp Panel by RT-PCR (Flu A&B, Covid) Anterior Nasal Swab     Status: None   Collection Time: 06/04/22  7:25 PM   Specimen: Anterior Nasal Swab  Result  Value Ref Range Status   SARS Coronavirus 2 by RT PCR NEGATIVE NEGATIVE Final    Comment: (NOTE) SARS-CoV-2 target nucleic acids are NOT DETECTED.  The SARS-CoV-2 RNA is generally detectable in upper respiratory specimens during the acute phase of infection. The lowest concentration of SARS-CoV-2 viral copies this assay can detect is 138 copies/mL. A negative result does not preclude SARS-Cov-2 infection and should not be used as the sole basis for treatment or other patient management decisions. A negative result may occur with  improper specimen collection/handling, submission of specimen other than nasopharyngeal swab, presence of viral mutation(s) within the areas targeted by this assay, and inadequate number of viral copies(<138 copies/mL). A negative result must be combined with clinical observations, patient history, and epidemiological information. The expected result is Negative.  Fact Sheet for Patients:  EntrepreneurPulse.com.au  Fact Sheet for Healthcare Providers:  IncredibleEmployment.be  This test is no t yet approved or cleared by the [place] FDA and  has been authorized for detection and/or diagnosis of SARS-CoV-2 by FDA under an Emergency Use Authorization (EUA). This EUA will remain  in effect (meaning this test can be used) for the duration of the COVID-19 declaration under Section 564(b)(1) of the Act, 21 U.S.C.section 360bbb-3(b)(1), unless the authorization is terminated  or revoked sooner.       Influenza A by PCR NEGATIVE NEGATIVE Final   Influenza B by PCR NEGATIVE NEGATIVE Final    Comment: (NOTE) The Xpert Xpress SARS-CoV-2/FLU/RSV plus assay is intended as an aid in the diagnosis of influenza from Nasopharyngeal swab specimens and should not be used as a sole basis for treatment. Nasal washings and aspirates are unacceptable for Xpert Xpress SARS-CoV-2/FLU/RSV testing.  Fact Sheet for  Patients: EntrepreneurPulse.com.au  Fact Sheet for Healthcare Providers: IncredibleEmployment.be  This test is not yet approved or cleared by the [place] FDA and has been authorized for detection and/or diagnosis of SARS-CoV-2 by FDA under an Emergency Use Authorization (EUA). This EUA will remain in effect (meaning this test can be used) for the duration of the COVID-19 declaration under Section 564(b)(1) of the Act, 21 U.S.C. section 360bbb-3(b)(1), unless the authorization is terminated or revoked.  Performed at [facility], [address] [place], [place] [address]  Radiology Studies: DG Chest Port 1 View  Result Date: 06/04/2022 CLINICAL DATA:  Fever, possible sepsis EXAM: PORTABLE CHEST 1 VIEW COMPARISON:  02/28/2022 FINDINGS: Cardiac size is within normal limits. There are no signs of alveolar pulmonary edema. There is slight prominence of interstitial markings in both lungs. There is no focal consolidation. There is no pleural effusion or pneumothorax. Tip of right IJ chest port is seen in superior vena cava. IMPRESSION: No focal pulmonary consolidation is seen. There is no pleural effusion. There is slight prominence of interstitial markings in both lungs. This may be an apparent change due to differences in the techniques or suggest interstitial pneumonia. Electronically Signed   By: Elmer Picker M.D.   On: 06/04/2022 19:55        Scheduled Meds:  aspirin EC  81 mg Oral Daily   buPROPion  300 mg Oral Daily   enoxaparin (LOVENOX) injection  60 mg Subcutaneous Q24H   fenofibrate  160 mg Oral Daily   FLUoxetine  10 mg Oral Daily   gabapentin  300 mg Oral TID   insulin aspart  0-15 Units Subcutaneous TID WC   magnesium oxide  400 mg Oral TID   pantoprazole  40 mg Oral Daily   [START ON 06/06/2022] pneumococcal 20-valent conjugate vaccine  0.5 mL Intramuscular Tomorrow-1000   pramipexole  0.75 mg Oral TID    tamsulosin  0.4 mg Oral Daily   zolpidem  5 mg Oral QHS   Continuous Infusions:  ceFEPime (MAXIPIME) IV 2 g (06/05/22 0512)   vancomycin       LOS: 1 day    Time spent: 35 minutes    Jimena Wieczorek Darleen Crocker, DO [facility]  If 7PM-7AM, please contact night-coverage [email or web address] 06/05/2022, 8:45 AM

## 2022-06-06 DIAGNOSIS — A419 Sepsis, unspecified organism: Secondary | ICD-10-CM | POA: Diagnosis not present

## 2022-06-06 DIAGNOSIS — L0390 Cellulitis, unspecified: Secondary | ICD-10-CM | POA: Diagnosis not present

## 2022-06-06 LAB — CBC
HCT: 26.9 % — ABNORMAL LOW (ref 39.0–52.0)
Hemoglobin: 8.8 g/dL — ABNORMAL LOW (ref 13.0–17.0)
MCH: 32.7 pg (ref 26.0–34.0)
MCHC: 32.7 g/dL (ref 30.0–36.0)
MCV: 100 fL (ref 80.0–100.0)
Platelets: 140 10*3/uL — ABNORMAL LOW (ref 150–400)
RBC: 2.69 MIL/uL — ABNORMAL LOW (ref 4.22–5.81)
RDW: 13.2 % (ref 11.5–15.5)
WBC: 3.9 10*3/uL — ABNORMAL LOW (ref 4.0–10.5)
nRBC: 0 % (ref 0.0–0.2)

## 2022-06-06 LAB — BASIC METABOLIC PANEL
Anion gap: 8 (ref 5–15)
BUN: 23 mg/dL (ref 8–23)
CO2: 23 mmol/L (ref 22–32)
Calcium: 8.4 mg/dL — ABNORMAL LOW (ref 8.9–10.3)
Chloride: 107 mmol/L (ref 98–111)
Creatinine, Ser: 1.1 mg/dL (ref 0.61–1.24)
GFR, Estimated: >60 mL/min (ref >60)
Glucose, Bld: 112 mg/dL — ABNORMAL HIGH (ref 70–99)
Potassium: 3.7 mmol/L (ref 3.5–5.1)
Sodium: 138 mmol/L (ref 135–145)

## 2022-06-06 LAB — GLUCOSE, CAPILLARY
Glucose-Capillary: 99 mg/dL (ref 70–99)
Glucose-Capillary: 155 mg/dL — ABNORMAL HIGH (ref 70–99)
Glucose-Capillary: 140 mg/dL — ABNORMAL HIGH (ref 70–99)
Glucose-Capillary: 103 mg/dL — ABNORMAL HIGH (ref 70–99)

## 2022-06-06 LAB — MAGNESIUM: Magnesium: 1.7 mg/dL (ref 1.7–2.4)

## 2022-06-06 MED ORDER — VANCOMYCIN HCL 1750 MG/350ML IV SOLN
1750.0000 mg | INTRAVENOUS | Status: DC
  Administered 2022-06-06 – 2022-06-07 (×2): 1750 mg via INTRAVENOUS
  Filled 2022-06-06 (×3): qty 350

## 2022-06-06 MED ORDER — FUROSEMIDE 10 MG/ML IJ SOLN
20.0000 mg | Freq: Once | INTRAMUSCULAR | Status: AC
  Administered 2022-06-06: 20 mg via INTRAVENOUS
  Filled 2022-06-06: qty 2

## 2022-06-06 NOTE — Progress Notes (Signed)
PROGRESS NOTE    Marvin Carr  WVP:[id number] DOB: [birth year] DOA: 06/04/2022 PCP: Celene Squibb, MD   Brief Narrative:    Marvin Carr is a 66 y.o. 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  He was admitted for sepsis, present on admission secondary to bilateral lower extremity cellulitis.  He is noted to have a recent 25-30 pound weight gain with swelling to his lower extremities.  Assessment & Plan:   Active Problems:   Sepsis due to cellulitis (HCC)   CAD (coronary artery disease), native coronary artery   Essential hypertension   Neuropathy   Cholangiocarcinoma metastatic to liver (HCC)   Obesity   Depression   Pre-diabetes  Assessment and Plan:   Sepsis, present on admission due to bilateral lower extremity cellulitis ([place]) As evidenced by fever with a Tmax of 101.9, tachycardia, tachypnea, leukocytosis and bilateral lower extremity cellulitis. Patient has a normal lactic acid level Continue empiric antibiotic therapy with vancomycin only Patient had venous Dopplers done on 09/11 which were negative for bilateral lower extremity DVT Blood cultures with no growth noted   Pre-diabetes Hold metformin and Rybelsus Maintain consistent carbohydrate diet Glycemic control with sliding scale insulin   Depression Continue bupropion and fluoxetine   Obesity BMI 54.62 Complicates overall prognosis and care   Cholangiocarcinoma metastatic to liver [facility]) Patient has a history of cholangiocarcinoma metastatic to liver as well as peritoneal carcinomatosis. Follow-up with oncology for chemotherapy as an outpatient   Neuropathy Most likely related to ongoing chemotherapy Continue gabapentin   Essential  hypertension Patient is normotensive Hold amlodipine, hydrochlorothiazide and lisinopril for now Monitor with IV Lasix   Volume overload 2D echocardiogram with LVEF 60-65% and no other acute abnormalities IV Lasix to continue at 20 mg today Strict ins and outs and daily weights Repeat labs   CAD (coronary artery disease), native coronary artery Continue aspirin.  Nitroglycerin    DVT prophylaxis: Lovenox Code Status: Full Family Communication: None at bedside Disposition Plan:  Status is: Inpatient Remains inpatient appropriate because: Need for IV medications.     Consultants:  None   Procedures:  None   Antimicrobials:  Anti-infectives (From admission, onward)    Start     Dose/Rate Route Frequency Ordered Stop   06/05/22 2100  vancomycin (VANCOREADY) IVPB 1500 mg/300 mL        1,500 mg 150 mL/hr over 120 Minutes Intravenous Every 24 hours 06/04/22 1946     06/05/22 0400  ceFEPIme (MAXIPIME) 2 g in sodium chloride 0.9 % 100 mL IVPB  Status:  Discontinued        2 g 200 mL/hr over 30 Minutes Intravenous Every 8 hours 06/04/22 1944 06/05/22 1330   06/04/22 2200  ceFAZolin (ANCEF) IVPB 2g/100 mL premix  Status:  Discontinued        2 g 200 mL/hr over 30 Minutes Intravenous Every 8 hours 06/04/22 2126 06/04/22 2127   06/04/22 1945  vancomycin (VANCOREADY) IVPB 2000 mg/400 mL        2,000 mg 200 mL/hr over 120 Minutes Intravenous  Once 06/04/22 1943 06/04/22 2318   06/04/22 1930  ceFEPIme (MAXIPIME) 2 g in sodium chloride 0.9 % 100 mL IVPB        2 g 200 mL/hr over 30 Minutes Intravenous  Once 06/04/22  1919 06/04/22 2011   06/04/22 1930  metroNIDAZOLE (FLAGYL) IVPB 500 mg        500 mg 100 mL/hr over 60 Minutes Intravenous  Once 06/04/22 1919 06/04/22 2114   06/04/22 1930  vancomycin (VANCOCIN) IVPB 1000 mg/200 mL premix  Status:  Discontinued        1,000 mg 200 mL/hr over 60 Minutes Intravenous  Once 06/04/22 1919 06/04/22 1943       Subjective: Patient seen  and evaluated today with no new acute complaints or concerns. No acute concerns or events noted overnight.  His lower extremity edema has been improving as well as the erythema in his extremities.  Objective: Vitals:   06/05/22 0823 06/05/22 1232 06/05/22 1948 06/06/22 0559  BP:  124/67 125/74 128/76  Pulse:  81 71 78  Resp:  '20 20 18  '$ Temp:  97.7 F (36.5 C) 98 F (36.7 C) (!) 97.1 F (36.2 C)  TempSrc:  Oral Oral   SpO2:  93% 96% 97%  Weight: 123.8 kg   122.7 kg  Height: 6' (1.829 m)       Intake/Output Summary (Last 24 hours) at 06/06/2022 1034 Last data filed at 06/06/2022 0700 Gross per 24 hour  Intake 680 ml  Output 4550 ml  Net -3870 ml   Filed Weights   06/05/22 0823 06/06/22 0559  Weight: 123.8 kg 122.7 kg    Examination:  General exam: Appears calm and comfortable  Respiratory system: Clear to auscultation. Respiratory effort normal. Cardiovascular system: S1 & S2 heard, RRR.  Gastrointestinal system: Abdomen is soft Central nervous system: Alert and awake Extremities: 1-2+ pitting edema bilaterally with erythema improving Skin: No significant lesions noted Psychiatry: Flat affect.    Data Reviewed: I have personally reviewed following labs and imaging studies  CBC: Recent Labs  Lab 05/30/22 1312 06/04/22 1919 06/05/22 0602 06/06/22 0436  WBC 6.6 13.0* 9.4 3.9*  NEUTROABS 4.4 12.1*  --   --   HGB 10.2* 9.5* 9.3* 8.8*  HCT 30.6* 28.6* 29.3* 26.9*  MCV 98.7 98.3 102.1* 100.0  PLT 255 205 172 932*   Basic Metabolic Panel: Recent Labs  Lab 05/30/22 1312 06/04/22 1919 06/05/22 0602 06/06/22 0436  NA 138 131* 135 138  K 3.7 4.2 3.7 3.7  CL 104 102 104 107  CO2 '25 22 24 23  '$ GLUCOSE 147* 142* 102* 112*  BUN 31* 28* 25* 23  CREATININE 1.42* 1.40* 1.32* 1.10  CALCIUM 9.2 8.4* 8.2* 8.4*  MG 1.8  --   --  1.7   GFR: Estimated Creatinine Clearance: 89.3 mL/min (by C-G formula based on SCr of 1.1 mg/dL). Liver Function Tests: Recent Labs  Lab  05/30/22 1312 06/04/22 1919  AST 26 23  ALT 25 20  ALKPHOS 69 50  BILITOT 0.6 0.6  PROT 6.6 6.2*  ALBUMIN 3.4* 3.1*   No results for input(s): "LIPASE", "AMYLASE" in the last 168 hours. No results for input(s): "AMMONIA" in the last 168 hours. Coagulation Profile: Recent Labs  Lab 06/04/22 1919  INR 1.1   Cardiac Enzymes: No results for input(s): "CKTOTAL", "CKMB", "CKMBINDEX", "TROPONINI" in the last 168 hours. BNP (last 3 results) No results for input(s): "PROBNP" in the last 8760 hours. HbA1C: Recent Labs    06/04/22 1919  HGBA1C 5.1   CBG: Recent Labs  Lab 06/04/22 2219 06/05/22 0804 06/05/22 1026 06/05/22 1634 06/06/22 0706  GLUCAP 115* 116* 126* 110* 99   Lipid Profile: No results for input(s): "CHOL", "HDL", "LDLCALC", "  TRIG", "CHOLHDL", "LDLDIRECT" in the last 72 hours. Thyroid Function Tests: No results for input(s): "TSH", "T4TOTAL", "FREET4", "T3FREE", "THYROIDAB" in the last 72 hours. Anemia Panel: No results for input(s): "VITAMINB12", "FOLATE", "FERRITIN", "TIBC", "IRON", "RETICCTPCT" in the last 72 hours. Sepsis Labs: Recent Labs  Lab 06/04/22 1919 06/04/22 2107  LATICACIDVEN 1.3 0.9    Recent Results (from the past 240 hour(s))  Blood Culture (routine x 2)     Status: None (Preliminary result)   Collection Time: 06/04/22  7:19 PM   Specimen: BLOOD LEFT WRIST  Result Value Ref Range Status   Specimen Description   Final    BLOOD LEFT WRIST BOTTLES DRAWN AEROBIC AND ANAEROBIC   Special Requests Blood Culture adequate volume  Final   Culture   Final    NO GROWTH < 12 HOURS Performed at [facility], [address], [place], [place] [address]    Report Status PENDING  Incomplete  Urine Culture     Status: None (Preliminary result)   Collection Time: 06/04/22  7:19 PM   Specimen: In/Out Cath Urine  Result Value Ref Range Status   Specimen Description   Final    IN/OUT CATH URINE Performed at [facility], [address],  [place], [place] [address]    Special Requests   Final    NONE Performed at [facility], [address] [place], [place] [address]    Culture   Final    CULTURE REINCUBATED FOR BETTER GROWTH Performed at [facility], [facility] [address], [place], [place] [address]    Report Status PENDING  Incomplete  Blood Culture (routine x 2)     Status: None (Preliminary result)   Collection Time: 06/04/22  7:24 PM   Specimen: BLOOD RIGHT WRIST  Result Value Ref Range Status   Specimen Description   Final    BLOOD RIGHT WRIST BOTTLES DRAWN AEROBIC AND ANAEROBIC   Special Requests Blood Culture adequate volume  Final   Culture   Final    NO GROWTH < 12 HOURS Performed at [facility], [address] [place], [place] [address]    Report Status PENDING  Incomplete  Resp Panel by RT-PCR (Flu A&B, Covid) Anterior Nasal Swab     Status: None   Collection Time: 06/04/22  7:25 PM   Specimen: Anterior Nasal Swab  Result Value Ref Range Status   SARS Coronavirus 2 by RT PCR NEGATIVE NEGATIVE Final    Comment: (NOTE) SARS-CoV-2 target nucleic acids are NOT DETECTED.  The SARS-CoV-2 RNA is generally detectable in upper respiratory specimens during the acute phase of infection. The lowest concentration of SARS-CoV-2 viral copies this assay can detect is 138 copies/mL. A negative result does not preclude SARS-Cov-2 infection and should not be used as the sole basis for treatment or other patient management decisions. A negative result may occur with  improper specimen collection/handling, submission of specimen other than nasopharyngeal swab, presence of viral mutation(s) within the areas targeted by this assay, and inadequate number of viral copies(<138 copies/mL). A negative result must be combined with clinical observations, patient history, and epidemiological information. The expected result is Negative.  Fact Sheet for Patients:  EntrepreneurPulse.com.au  Fact Sheet for  Healthcare Providers:  IncredibleEmployment.be  This test is no t yet approved or cleared by the [place] FDA and  has been authorized for detection and/or diagnosis of SARS-CoV-2 by FDA under an Emergency Use Authorization (EUA). This EUA will remain  in effect (meaning this test  can be used) for the duration of the COVID-19 declaration under Section 564(b)(1) of the Act, 21 U.S.C.section 360bbb-3(b)(1), unless the authorization is terminated  or revoked sooner.       Influenza A by PCR NEGATIVE NEGATIVE Final   Influenza B by PCR NEGATIVE NEGATIVE Final    Comment: (NOTE) The Xpert Xpress SARS-CoV-2/FLU/RSV plus assay is intended as an aid in the diagnosis of influenza from Nasopharyngeal swab specimens and should not be used as a sole basis for treatment. Nasal washings and aspirates are unacceptable for Xpert Xpress SARS-CoV-2/FLU/RSV testing.  Fact Sheet for Patients: EntrepreneurPulse.com.au  Fact Sheet for Healthcare Providers: IncredibleEmployment.be  This test is not yet approved or cleared by the [place] FDA and has been authorized for detection and/or diagnosis of SARS-CoV-2 by FDA under an Emergency Use Authorization (EUA). This EUA will remain in effect (meaning this test can be used) for the duration of the COVID-19 declaration under Section 564(b)(1) of the Act, 21 U.S.C. section 360bbb-3(b)(1), unless the authorization is terminated or revoked.  Performed at [facility], [address] [place], [place] [address]   MRSA Next Gen by PCR, Nasal     Status: None   Collection Time: 06/05/22  6:57 AM   Specimen: Nasal Mucosa; Nasal Swab  Result Value Ref Range Status   MRSA by PCR Next Gen NOT DETECTED NOT DETECTED Final    Comment: (NOTE) The GeneXpert MRSA Assay (FDA approved for NASAL specimens only), is one component of a comprehensive MRSA colonization surveillance program. It is not intended  to diagnose MRSA infection nor to guide or monitor treatment for MRSA infections. Test performance is not FDA approved in patients less than 3 years old. Performed at [facility], [address] [place], [place] [address]          Radiology Studies: ECHOCARDIOGRAM COMPLETE  Result Date: 06/05/2022    ECHOCARDIOGRAM REPORT   Patient Name:   Marvin Carr Date of Exam: 06/05/2022 Medical Rec #:  [id number]       Height:       72.0 in Accession #:    [id number]      Weight:       272.9 lb Date of Birth:  01-20-1956       BSA:          2.432 m Patient Age:    72 years        BP:           152/75 mmHg Patient Gender: M               HR:           77 bpm. Exam Location:  [facility] Procedure: 2D Echo, Cardiac Doppler and Color Doppler Indications:    CHF  History:        Patient has no prior history of Echocardiogram examinations.                 CAD; Risk Factors:Hypertension and Dyslipidemia.  Sonographer:    Wenda Low Referring Phys: [id number] Renick D Hardesty  1. Left ventricular ejection fraction, by estimation, is 60 to 65%. The left ventricle has normal function. The left ventricle has no regional wall motion abnormalities. There is mild asymmetric left ventricular hypertrophy of the septal segment. Left ventricular diastolic parameters are indeterminate. Elevated left ventricular end-diastolic pressure.  2. Right ventricular systolic function is normal. The right ventricular size is normal. Tricuspid regurgitation signal is inadequate for assessing PA pressure.  3. Left atrial size was mildly dilated.  4. The mitral valve is grossly normal. Trivial mitral valve regurgitation.  5. The aortic valve is tricuspid. Aortic valve regurgitation is trivial. No aortic stenosis is present. Aortic valve mean gradient measures 4.0 mmHg.  6. The inferior vena cava is normal in size with greater than 50% respiratory variability, suggesting right atrial pressure of 3 mmHg. Comparison(s): No prior  Echocardiogram. FINDINGS  Left Ventricle: Left ventricular ejection fraction, by estimation, is 60 to 65%. The left ventricle has normal function. The left ventricle has no regional wall motion abnormalities. The left ventricular internal cavity size was normal in size. There is  mild asymmetric left ventricular hypertrophy of the septal segment. Left ventricular diastolic parameters are indeterminate. Elevated left ventricular end-diastolic pressure. Right Ventricle: The right ventricular size is normal. No increase in right ventricular wall thickness. Right ventricular systolic function is normal. Tricuspid regurgitation signal is inadequate for assessing PA pressure. Left Atrium: Left atrial size was mildly dilated. Right Atrium: Right atrial size was normal in size. Pericardium: There is no evidence of pericardial effusion. Mitral Valve: The mitral valve is grossly normal. Trivial mitral valve regurgitation. MV peak gradient, 8.5 mmHg. The mean mitral valve gradient is 3.0 mmHg. Tricuspid Valve: The tricuspid valve is grossly normal. Tricuspid valve regurgitation is mild. Aortic Valve: The aortic valve is tricuspid. There is mild aortic valve annular calcification. Aortic valve regurgitation is trivial. No aortic stenosis is present. Aortic valve mean gradient measures 4.0 mmHg. Aortic valve peak gradient measures 7.2 mmHg. Aortic valve area, by VTI measures 3.00 cm. Pulmonic Valve: The pulmonic valve was grossly normal. Pulmonic valve regurgitation is trivial. Aorta: The aortic root is normal in size and structure. Venous: The inferior vena cava is normal in size with greater than 50% respiratory variability, suggesting right atrial pressure of 3 mmHg. IAS/Shunts: No atrial level shunt detected by color flow Doppler.  LEFT VENTRICLE PLAX 2D LVIDd:         5.40 cm   Diastology LVIDs:         3.30 cm   LV e' medial:    7.83 cm/s LV PW:         0.90 cm   LV E/e' medial:  19.5 LV IVS:        1.10 cm   LV e'  lateral:   10.00 cm/s LVOT diam:     2.00 cm   LV E/e' lateral: 15.3 LV SV:         84 LV SV Index:   34 LVOT Area:     3.14 cm  RIGHT VENTRICLE RV Basal diam:  3.70 cm RV Mid diam:    3.40 cm RV S prime:     11.00 cm/s TAPSE (M-mode): 2.9 cm LEFT ATRIUM             Index        RIGHT ATRIUM           Index LA diam:        4.50 cm 1.85 cm/m   RA Area:     20.80 cm LA Vol (A2C):   87.9 ml 36.15 ml/m  RA Volume:   58.10 ml  23.89 ml/m LA Vol (A4C):   80.9 ml 33.27 ml/m LA Biplane Vol: 88.1 ml 36.23 ml/m  AORTIC VALVE                    PULMONIC VALVE AV Area (Vmax):    2.77  cm     PV Vmax:       1.03 m/s AV Area (Vmean):   2.69 cm     PV Peak grad:  4.2 mmHg AV Area (VTI):     3.00 cm AV Vmax:           134.00 cm/s AV Vmean:          96.200 cm/s AV VTI:            0.279 m AV Peak Grad:      7.2 mmHg AV Mean Grad:      4.0 mmHg LVOT Vmax:         118.00 cm/s LVOT Vmean:        82.500 cm/s LVOT VTI:          0.266 m LVOT/AV VTI ratio: 0.95  AORTA Ao Root diam: 3.50 cm MITRAL VALVE MV Area (PHT): 3.68 cm     SHUNTS MV Area VTI:   2.35 cm     Systemic VTI:  0.27 m MV Peak grad:  8.5 mmHg     Systemic Diam: 2.00 cm MV Mean grad:  3.0 mmHg MV Vmax:       1.46 m/s MV Vmean:      78.5 cm/s MV Decel Time: 206 msec MV E velocity: 153.00 cm/s MV A velocity: 80.00 cm/s MV E/A ratio:  1.91 Rozann Lesches MD Electronically signed by Rozann Lesches MD Signature Date/Time: 06/05/2022/1:01:07 PM    Final    DG Chest Port 1 View  Result Date: 06/04/2022 CLINICAL DATA:  Fever, possible sepsis EXAM: PORTABLE CHEST 1 VIEW COMPARISON:  02/28/2022 FINDINGS: Cardiac size is within normal limits. There are no signs of alveolar pulmonary edema. There is slight prominence of interstitial markings in both lungs. There is no focal consolidation. There is no pleural effusion or pneumothorax. Tip of right IJ chest port is seen in superior vena cava. IMPRESSION: No focal pulmonary consolidation is seen. There is no pleural effusion.  There is slight prominence of interstitial markings in both lungs. This may be an apparent change due to differences in the techniques or suggest interstitial pneumonia. Electronically Signed   By: Elmer Picker M.D.   On: 06/04/2022 19:55        Scheduled Meds:  aspirin EC  81 mg Oral Daily   buPROPion  300 mg Oral Daily   enoxaparin (LOVENOX) injection  60 mg Subcutaneous Q24H   fenofibrate  160 mg Oral Daily   FLUoxetine  10 mg Oral Daily   furosemide  20 mg Intravenous Once   gabapentin  300 mg Oral TID   insulin aspart  0-15 Units Subcutaneous TID WC   magnesium oxide  400 mg Oral TID   pantoprazole  40 mg Oral Daily   pramipexole  0.75 mg Oral TID   tamsulosin  0.4 mg Oral Daily   zolpidem  5 mg Oral QHS   Continuous Infusions:  vancomycin 1,500 mg (06/05/22 2137)     LOS: 2 days    Time spent: 35 minutes    [place] Darleen Crocker, DO [facility]  If 7PM-7AM, please contact night-coverage [email or web address] 06/06/2022, 10:34 AM

## 2022-06-07 ENCOUNTER — Ambulatory Visit: Payer: Medicare Other

## 2022-06-07 ENCOUNTER — Other Ambulatory Visit: Payer: Medicare Other

## 2022-06-07 DIAGNOSIS — L0390 Cellulitis, unspecified: Secondary | ICD-10-CM | POA: Diagnosis not present

## 2022-06-07 DIAGNOSIS — A419 Sepsis, unspecified organism: Secondary | ICD-10-CM | POA: Diagnosis not present

## 2022-06-07 LAB — CBC
HCT: 28.6 % — ABNORMAL LOW (ref 39.0–52.0)
Hemoglobin: 9.6 g/dL — ABNORMAL LOW (ref 13.0–17.0)
MCH: 33.1 pg (ref 26.0–34.0)
MCHC: 33.6 g/dL (ref 30.0–36.0)
MCV: 98.6 fL (ref 80.0–100.0)
Platelets: 129 10*3/uL — ABNORMAL LOW (ref 150–400)
RBC: 2.9 MIL/uL — ABNORMAL LOW (ref 4.22–5.81)
RDW: 13.3 % (ref 11.5–15.5)
WBC: 5.3 10*3/uL (ref 4.0–10.5)
nRBC: 0 % (ref 0.0–0.2)

## 2022-06-07 LAB — BASIC METABOLIC PANEL
Anion gap: 4 — ABNORMAL LOW (ref 5–15)
BUN: 19 mg/dL (ref 8–23)
CO2: 26 mmol/L (ref 22–32)
Calcium: 8.7 mg/dL — ABNORMAL LOW (ref 8.9–10.3)
Chloride: 107 mmol/L (ref 98–111)
Creatinine, Ser: 0.99 mg/dL (ref 0.61–1.24)
GFR, Estimated: >60 mL/min (ref >60)
Glucose, Bld: 108 mg/dL — ABNORMAL HIGH (ref 70–99)
Potassium: 3.9 mmol/L (ref 3.5–5.1)
Sodium: 137 mmol/L (ref 135–145)

## 2022-06-07 LAB — URINE CULTURE: Culture: 40000 — AB

## 2022-06-07 LAB — GLUCOSE, CAPILLARY
Glucose-Capillary: 113 mg/dL — ABNORMAL HIGH (ref 70–99)
Glucose-Capillary: 124 mg/dL — ABNORMAL HIGH (ref 70–99)
Glucose-Capillary: 123 mg/dL — ABNORMAL HIGH (ref 70–99)
Glucose-Capillary: 125 mg/dL — ABNORMAL HIGH (ref 70–99)

## 2022-06-07 LAB — MAGNESIUM: Magnesium: 1.8 mg/dL (ref 1.7–2.4)

## 2022-06-07 MED ORDER — FUROSEMIDE 10 MG/ML IJ SOLN
40.0000 mg | Freq: Once | INTRAMUSCULAR | Status: AC
  Administered 2022-06-07: 40 mg via INTRAVENOUS
  Filled 2022-06-07: qty 4

## 2022-06-07 MED ORDER — GABAPENTIN 400 MG PO CAPS
400.0000 mg | ORAL_CAPSULE | Freq: Three times a day (TID) | ORAL | Status: DC
  Administered 2022-06-07 – 2022-06-08 (×4): 400 mg via ORAL
  Filled 2022-06-07 (×4): qty 1

## 2022-06-07 NOTE — Progress Notes (Signed)
PROGRESS NOTE    Marvin Carr  LZJ:[id number] DOB: 25-Apr-1956 DOA: 06/04/2022 PCP: Celene Squibb, MD   Brief Narrative:   Marvin Carr is a 66 y.o. 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  He was admitted for sepsis, present on admission secondary to bilateral lower extremity cellulitis.  He is noted to have a recent 25-30 pound weight gain with swelling to his lower extremities for which he is currently being diuresed and responding well.  Assessment & Plan:   Active Problems:   Sepsis due to cellulitis (HCC)   CAD (coronary artery disease), native coronary artery   Essential hypertension   Neuropathy   Cholangiocarcinoma metastatic to liver (HCC)   Obesity   Depression   Pre-diabetes  Assessment and Plan:   Sepsis, present on admission due to bilateral lower extremity cellulitis ([place]) As evidenced by fever with a Tmax of 101.9, tachycardia, tachypnea, leukocytosis and bilateral lower extremity cellulitis. Patient has a normal lactic acid level Continue empiric antibiotic therapy with vancomycin only Patient had venous Dopplers done on 09/11 which were negative for bilateral lower extremity DVT Blood cultures with no growth noted   Pre-diabetes Hold metformin and Rybelsus Maintain consistent carbohydrate diet Glycemic control with sliding scale insulin   Depression Continue bupropion and fluoxetine   Obesity BMI 02.40 Complicates overall prognosis and care   Cholangiocarcinoma metastatic to liver [facility]) Patient has a history of cholangiocarcinoma metastatic to liver as well as peritoneal carcinomatosis. Follow-up with oncology for chemotherapy as an outpatient   Neuropathy Most likely related to  ongoing chemotherapy Continue gabapentin   Essential hypertension Patient is normotensive Hold amlodipine, hydrochlorothiazide and lisinopril for now Monitor with IV Lasix   Volume overload 2D echocardiogram with LVEF 60-65% and no other acute abnormalities IV Lasix to continue at 40 mg today Strict ins and outs and daily weights Repeat labs   CAD (coronary artery disease), native coronary artery Continue aspirin.  Nitroglycerin    DVT prophylaxis: Lovenox Code Status: Full Family Communication: None at bedside Disposition Plan:  Status is: Inpatient Remains inpatient appropriate because: Need for IV medications.     Consultants:  None   Procedures:  None   Antimicrobials:  Anti-infectives (From admission, onward)    Start     Dose/Rate Route Frequency Ordered Stop   06/06/22 2100  vancomycin (VANCOREADY) IVPB 1750 mg/350 mL        1,750 mg 175 mL/hr over 120 Minutes Intravenous Every 24 hours 06/06/22 1205     06/05/22 2100  vancomycin (VANCOREADY) IVPB 1500 mg/300 mL  Status:  Discontinued        1,500 mg 150 mL/hr over 120 Minutes Intravenous Every 24 hours 06/04/22 1946 06/06/22 1205   06/05/22 0400  ceFEPIme (MAXIPIME) 2 g in sodium chloride 0.9 % 100 mL IVPB  Status:  Discontinued        2 g 200 mL/hr over 30 Minutes Intravenous Every 8 hours 06/04/22 1944 06/05/22 1330   06/04/22 2200  ceFAZolin (ANCEF) IVPB 2g/100 mL premix  Status:  Discontinued        2 g 200 mL/hr over 30 Minutes Intravenous Every 8 hours 06/04/22 2126 06/04/22 2127   06/04/22 1945  vancomycin (VANCOREADY) IVPB 2000 mg/400 mL        2,000 mg 200  mL/hr over 120 Minutes Intravenous  Once 06/04/22 1943 06/04/22 2318   06/04/22 1930  ceFEPIme (MAXIPIME) 2 g in sodium chloride 0.9 % 100 mL IVPB        2 g 200 mL/hr over 30 Minutes Intravenous  Once 06/04/22 1919 06/04/22 2011   06/04/22 1930  metroNIDAZOLE (FLAGYL) IVPB 500 mg        500 mg 100 mL/hr over 60 Minutes Intravenous  Once  06/04/22 1919 06/04/22 2114   06/04/22 1930  vancomycin (VANCOCIN) IVPB 1000 mg/200 mL premix  Status:  Discontinued        1,000 mg 200 mL/hr over 60 Minutes Intravenous  Once 06/04/22 1919 06/04/22 1943       Subjective: Patient seen and evaluated today with no new acute complaints or concerns. No acute concerns or events noted overnight.  He did not sleep well last night, but is stating that his edema is improving.  He appears to be diuresing well, but is still approximately 20 pounds over his baseline weight.  Objective: Vitals:   06/06/22 0559 06/06/22 1312 06/06/22 1947 06/07/22 0525  BP: 128/76 (!) 140/85 128/75 133/74  Pulse: 78 71 72 71  Resp: '18 18 19   '$ Temp: (!) 97.1 F (36.2 C) 98.5 F (36.9 C) 97.9 F (36.6 C) 98.8 F (37.1 C)  TempSrc:   Oral Oral  SpO2: 97% 99% 98% 97%  Weight: 122.7 kg  121.7 kg   Height:        Intake/Output Summary (Last 24 hours) at 06/07/2022 1109 Last data filed at 06/07/2022 0900 Gross per 24 hour  Intake 1610 ml  Output 3050 ml  Net -1440 ml   Filed Weights   06/05/22 0823 06/06/22 0559 06/06/22 1947  Weight: 123.8 kg 122.7 kg 121.7 kg    Examination:  General exam: Appears calm and comfortable  Respiratory system: Clear to auscultation. Respiratory effort normal. Cardiovascular system: S1 & S2 heard, RRR.  Gastrointestinal system: Abdomen is soft Central nervous system: Alert and awake Extremities: 1-2+ pitting edema bilaterally with erythema improving Skin: No significant lesions noted Psychiatry: Flat affect.    Data Reviewed: I have personally reviewed following labs and imaging studies  CBC: Recent Labs  Lab 06/04/22 1919 06/05/22 0602 06/06/22 0436 06/07/22 0839  WBC 13.0* 9.4 3.9* 5.3  NEUTROABS 12.1*  --   --   --   HGB 9.5* 9.3* 8.8* 9.6*  HCT 28.6* 29.3* 26.9* 28.6*  MCV 98.3 102.1* 100.0 98.6  PLT 205 172 140* 952*   Basic Metabolic Panel: Recent Labs  Lab 06/04/22 1919 06/05/22 0602  06/06/22 0436 06/07/22 0839  NA 131* 135 138 137  K 4.2 3.7 3.7 3.9  CL 102 104 107 107  CO2 '22 24 23 26  '$ GLUCOSE 142* 102* 112* 108*  BUN 28* 25* 23 19  CREATININE 1.40* 1.32* 1.10 0.99  CALCIUM 8.4* 8.2* 8.4* 8.7*  MG  --   --  1.7 1.8   GFR: Estimated Creatinine Clearance: 98.8 mL/min (by C-G formula based on SCr of 0.99 mg/dL). Liver Function Tests: Recent Labs  Lab 06/04/22 1919  AST 23  ALT 20  ALKPHOS 50  BILITOT 0.6  PROT 6.2*  ALBUMIN 3.1*   No results for input(s): "LIPASE", "AMYLASE" in the last 168 hours. No results for input(s): "AMMONIA" in the last 168 hours. Coagulation Profile: Recent Labs  Lab 06/04/22 1919  INR 1.1   Cardiac Enzymes: No results for input(s): "CKTOTAL", "CKMB", "CKMBINDEX", "TROPONINI" in the  last 168 hours. BNP (last 3 results) No results for input(s): "PROBNP" in the last 8760 hours. HbA1C: Recent Labs    06/04/22 1919  HGBA1C 5.1   CBG: Recent Labs  Lab 06/06/22 1114 06/06/22 1614 06/06/22 2123 06/07/22 0723 06/07/22 1107  GLUCAP 155* 140* 103* 113* 124*   Lipid Profile: No results for input(s): "CHOL", "HDL", "LDLCALC", "TRIG", "CHOLHDL", "LDLDIRECT" in the last 72 hours. Thyroid Function Tests: No results for input(s): "TSH", "T4TOTAL", "FREET4", "T3FREE", "THYROIDAB" in the last 72 hours. Anemia Panel: No results for input(s): "VITAMINB12", "FOLATE", "FERRITIN", "TIBC", "IRON", "RETICCTPCT" in the last 72 hours. Sepsis Labs: Recent Labs  Lab 06/04/22 1919 06/04/22 2107  LATICACIDVEN 1.3 0.9    Recent Results (from the past 240 hour(s))  Blood Culture (routine x 2)     Status: None (Preliminary result)   Collection Time: 06/04/22  7:19 PM   Specimen: BLOOD LEFT WRIST  Result Value Ref Range Status   Specimen Description   Final    BLOOD LEFT WRIST BOTTLES DRAWN AEROBIC AND ANAEROBIC   Special Requests Blood Culture adequate volume  Final   Culture   Final    NO GROWTH 3 DAYS Performed at [facility], [address], [place], [place] [address]    Report Status PENDING  Incomplete  Urine Culture     Status: Abnormal   Collection Time: 06/04/22  7:19 PM   Specimen: In/Out Cath Urine  Result Value Ref Range Status   Specimen Description   Final    IN/OUT CATH URINE Performed at [facility], [address], [place], [place] [address]    Special Requests   Final    NONE Performed at [facility], [address] [place], [place] [address]    Culture (A)  Final    40,000 COLONIES/mL AEROCOCCUS VIRIDANS Standardized susceptibility testing for this organism is not available. Performed at [facility], [place] [address] [place], [place] [address]    Report Status 06/07/2022 FINAL  Final  Blood Culture (routine x 2)     Status: None (Preliminary result)   Collection Time: 06/04/22  7:24 PM   Specimen: BLOOD RIGHT WRIST  Result Value Ref Range Status   Specimen Description   Final    BLOOD RIGHT WRIST BOTTLES DRAWN AEROBIC AND ANAEROBIC   Special Requests Blood Culture adequate volume  Final   Culture   Final    NO GROWTH 3 DAYS Performed at [facility], [address], [place], [place] [address]    Report Status PENDING  Incomplete  Resp Panel by RT-PCR (Flu A&B, Covid) Anterior Nasal Swab     Status: None   Collection Time: 06/04/22  7:25 PM   Specimen: Anterior Nasal Swab  Result Value Ref Range Status   SARS Coronavirus 2 by RT PCR NEGATIVE NEGATIVE Final    Comment: (NOTE) SARS-CoV-2 target nucleic acids are NOT DETECTED.  The SARS-CoV-2 RNA is generally detectable in upper respiratory specimens during the acute phase of infection. The lowest concentration of SARS-CoV-2 viral copies this assay can detect is 138 copies/mL. A negative result does not preclude SARS-Cov-2 infection and should not be used as the sole basis for treatment or other patient management decisions. A negative result may occur with  improper specimen collection/handling, submission of specimen  other than nasopharyngeal swab, presence of viral mutation(s) within the areas targeted by this assay, and inadequate number of viral copies(<138 copies/mL). A negative result must be combined with clinical observations, patient  history, and epidemiological information. The expected result is Negative.  Fact Sheet for Patients:  EntrepreneurPulse.com.au  Fact Sheet for Healthcare Providers:  IncredibleEmployment.be  This test is no t yet approved or cleared by the [place] FDA and  has been authorized for detection and/or diagnosis of SARS-CoV-2 by FDA under an Emergency Use Authorization (EUA). This EUA will remain  in effect (meaning this test can be used) for the duration of the COVID-19 declaration under Section 564(b)(1) of the Act, 21 U.S.C.section 360bbb-3(b)(1), unless the authorization is terminated  or revoked sooner.       Influenza A by PCR NEGATIVE NEGATIVE Final   Influenza B by PCR NEGATIVE NEGATIVE Final    Comment: (NOTE) The Xpert Xpress SARS-CoV-2/FLU/RSV plus assay is intended as an aid in the diagnosis of influenza from Nasopharyngeal swab specimens and should not be used as a sole basis for treatment. Nasal washings and aspirates are unacceptable for Xpert Xpress SARS-CoV-2/FLU/RSV testing.  Fact Sheet for Patients: EntrepreneurPulse.com.au  Fact Sheet for Healthcare Providers: IncredibleEmployment.be  This test is not yet approved or cleared by the [place] FDA and has been authorized for detection and/or diagnosis of SARS-CoV-2 by FDA under an Emergency Use Authorization (EUA). This EUA will remain in effect (meaning this test can be used) for the duration of the COVID-19 declaration under Section 564(b)(1) of the Act, 21 U.S.C. section 360bbb-3(b)(1), unless the authorization is terminated or revoked.  Performed at [facility], [address] [place], [place]  [address]   MRSA Next Gen by PCR, Nasal     Status: None   Collection Time: 06/05/22  6:57 AM   Specimen: Nasal Mucosa; Nasal Swab  Result Value Ref Range Status   MRSA by PCR Next Gen NOT DETECTED NOT DETECTED Final    Comment: (NOTE) The GeneXpert MRSA Assay (FDA approved for NASAL specimens only), is one component of a comprehensive MRSA colonization surveillance program. It is not intended to diagnose MRSA infection nor to guide or monitor treatment for MRSA infections. Test performance is not FDA approved in patients less than 60 years old. Performed at [facility], [address] [place], [place] [address]          Radiology Studies: No results found.      Scheduled Meds:  aspirin EC  81 mg Oral Daily   buPROPion  300 mg Oral Daily   enoxaparin (LOVENOX) injection  60 mg Subcutaneous Q24H   fenofibrate  160 mg Oral Daily   FLUoxetine  10 mg Oral Daily   furosemide  40 mg Intravenous Once   gabapentin  400 mg Oral TID   insulin aspart  0-15 Units Subcutaneous TID WC   magnesium oxide  400 mg Oral TID   pantoprazole  40 mg Oral Daily   pramipexole  0.75 mg Oral TID   tamsulosin  0.4 mg Oral Daily   zolpidem  5 mg Oral QHS   Continuous Infusions:  vancomycin Stopped (06/06/22 2300)     LOS: 3 days    Time spent: 35 minutes    Kayne Yuhas Darleen Crocker, DO [facility]  If 7PM-7AM, please contact night-coverage [email or web address] 06/07/2022, 11:09 AM

## 2022-06-07 NOTE — Progress Notes (Signed)
Primary RN notified re: assessment of IV, small amt of redness at insertion site, IV flushes without difficulty, no swelling noted, pt denies pain, Primary RN replaced tegaderm, Repeat flushes x 10 with NS completed without swelling, pt denies pain, no drainage noted around insertion site, pe Primary Rn the student RN should  move forward with IV administration of Lasix

## 2022-06-07 NOTE — Progress Notes (Signed)
Pharmacy Antibiotic Note  Marvin Carr is a 66 y.o. male admitted on 06/04/2022 with  unknown source .  Pharmacy has been consulted for vancomycin and cefepime dosing.  Plan: Vancomycin 1750 mg IV every 24 hours   Height: 6' (182.9 cm) Weight: 121.7 kg (268 lb 3.2 oz) IBW/kg (Calculated) : 77.6  Temp (24hrs), Avg:98.4 F (36.9 C), Min:97.9 F (36.6 C), Max:98.8 F (37.1 C)  Recent Labs  Lab 06/04/22 1919 06/04/22 2107 06/05/22 0602 06/06/22 0436 06/07/22 0839  WBC 13.0*  --  9.4 3.9* 5.3  CREATININE 1.40*  --  1.32* 1.10 0.99  LATICACIDVEN 1.3 0.9  --   --   --      Estimated Creatinine Clearance: 98.8 mL/min (by C-G formula based on SCr of 0.99 mg/dL).    No Known Allergies  Antimicrobials this admission: Cefepime 9/24 >> 9/25 Vanco 9/24 >>  Flagyl 9/24   Microbiology results: 9/24 BCx: ngtd 9/24 UCx: 40k aerococcus MRSA PCR: neg   Thank you for allowing pharmacy to be a part of this patient's care.  Ramond Craver 06/07/2022 10:25 AM

## 2022-06-08 DIAGNOSIS — L0390 Cellulitis, unspecified: Secondary | ICD-10-CM | POA: Diagnosis not present

## 2022-06-08 DIAGNOSIS — A419 Sepsis, unspecified organism: Secondary | ICD-10-CM | POA: Diagnosis not present

## 2022-06-08 LAB — BASIC METABOLIC PANEL
Anion gap: 6 (ref 5–15)
BUN: 21 mg/dL (ref 8–23)
CO2: 26 mmol/L (ref 22–32)
Calcium: 8.6 mg/dL — ABNORMAL LOW (ref 8.9–10.3)
Chloride: 103 mmol/L (ref 98–111)
Creatinine, Ser: 1.17 mg/dL (ref 0.61–1.24)
GFR, Estimated: >60 mL/min (ref >60)
Glucose, Bld: 108 mg/dL — ABNORMAL HIGH (ref 70–99)
Potassium: 4.1 mmol/L (ref 3.5–5.1)
Sodium: 135 mmol/L (ref 135–145)

## 2022-06-08 LAB — MAGNESIUM: Magnesium: 2 mg/dL (ref 1.7–2.4)

## 2022-06-08 LAB — GLUCOSE, CAPILLARY: Glucose-Capillary: 104 mg/dL — ABNORMAL HIGH (ref 70–99)

## 2022-06-08 MED ORDER — CEPHALEXIN 500 MG PO CAPS: 500.0000 mg | ORAL_CAPSULE | Freq: Four times a day (QID) | ORAL | 0 refills | Status: AC

## 2022-06-08 MED ORDER — FUROSEMIDE 40 MG PO TABS: 40.0000 mg | ORAL_TABLET | Freq: Every day | ORAL | 1 refills | Status: DC

## 2022-06-08 NOTE — Discharge Summary (Signed)
Physician Discharge Summary  Marvin Carr GYI:[id number] DOB: January 02, 1956 DOA: 06/04/2022  PCP: Marvin Squibb, MD  Admit date: 06/04/2022  Discharge date: 06/08/2022  Admitted From:Home  Disposition:  Home  Recommendations for Outpatient Follow-up:  Follow up with PCP in 1-2 weeks Follow-up with Marvin Carr as scheduled next week and repeat BMP Continue on Lasix 40 mg daily as prescribed Continue on Keflex as prescribed to finish course of treatment for suspected cellulitis Other home medications as prior  Home Health: None  Equipment/Devices: None  Discharge Condition:Stable  CODE STATUS: Full  Diet recommendation: Heart Healthy  Brief/Interim Summary: Marvin Carr is a 66 y.o. 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  He was admitted for sepsis, present on admission secondary to bilateral lower extremity cellulitis.  He is noted to have a recent 25-30 pound weight gain with swelling to his lower extremities for which he is currently being diuresed and responding well.  He continues to have some mild pitting edema, but can be transitioned to oral Lasix at this point as his creatinine is starting to show a bump.  His erythema has improved and he will continue on Keflex now as prescribed along with the Lasix and follow-up with Marvin Carr as scheduled next Wednesday.  No other acute events or concerns noted throughout the course of this admission.  Discharge Diagnoses:  Active Problems:   Sepsis due to cellulitis Marvin Carr)   CAD (coronary artery disease), native coronary artery   Essential hypertension   Neuropathy   Cholangiocarcinoma metastatic to liver [facility])   Obesity   Depression   Pre-diabetes  Principal  discharge diagnosis: Sepsis, present on admission secondary to bilateral lower extremity cellulitis along with volume overload.  Discharge Instructions  Discharge Instructions     Diet - low sodium heart healthy   Complete by: As directed    Increase activity slowly   Complete by: As directed       Allergies as of 06/08/2022   No Known Allergies      Medication List     TAKE these medications    Abdominal Pad 8"X10" Pads Use for leaking fluid from leg swelling   amLODipine 5 MG tablet Commonly known as: NORVASC Take 5 mg by mouth daily.   aspirin EC 81 MG tablet Take 81 mg by mouth daily.   buPROPion 300 MG 24 hr tablet Commonly known as: WELLBUTRIN XL Take 300 mg by mouth daily.   cephALEXin 500 MG capsule Commonly known as: KEFLEX Take 1 capsule (500 mg total) by mouth 4 (four) times daily for 6 days.   CISPLATIN IV Inject into the vein once a week. Day 1, Day 8 q 21 days Every other week.   cyclobenzaprine 10 MG tablet Commonly known as: FLEXERIL Take 10 mg by mouth at bedtime as needed for muscle spasms.   Elastic Bandage 6" Misc Use compression wraps during the day for leg swelling   Eszopiclone 3 MG Tabs Take 3 mg by mouth at bedtime. Take immediately before bedtime   famotidine 20 MG tablet Commonly known as: PEPCID TAKE (1) TABLET BY MOUTH AT BEDTIME. What changed: See the new instructions.   fenofibrate 160 MG tablet Take 160 mg by mouth daily.   FLUoxetine 10 MG capsule Commonly known as: PROZAC Take 10  mg by mouth daily.   furosemide 40 MG tablet Commonly known as: Lasix Take 1 tablet (40 mg total) by mouth daily.   gabapentin 300 MG capsule Commonly known as: NEURONTIN Take 1 capsule (300 mg total) by mouth 3 (three) times daily.   GEMZAR IV Inject into the vein once a week. Day 1, Day 8 q 21 days Every other week   IMFINZI IV Inject into the vein every 21 ( twenty-one) days.   magnesium oxide 400 (240 Mg) MG tablet Commonly  known as: MAG-OX Take 1 tablet (400 mg total) by mouth 2 (two) times daily. What changed: when to take this   metFORMIN 500 MG 24 hr tablet Commonly known as: GLUCOPHAGE-XR Take 500 mg by mouth 2 (two) times daily.   nitroGLYCERIN 0.4 MG SL tablet Commonly known as: NITROSTAT Place 1 tablet (0.4 mg total) under the tongue every 5 (five) minutes as needed for chest pain.   olmesartan 40 MG tablet Commonly known as: BENICAR Take 1 tablet (40 mg total) by mouth daily.   omeprazole 40 MG capsule Commonly known as: PRILOSEC Take 40 mg by mouth daily as needed (reflux).   pantoprazole 40 MG tablet Commonly known as: PROTONIX Take 1 tablet (40 mg total) by mouth daily. What changed:  when to take this reasons to take this   pramipexole 0.75 MG tablet Commonly known as: MIRAPEX Take 1 tablet (0.75 mg total) by mouth 3 (three) times daily.   Rybelsus 7 MG Tabs Generic drug: Semaglutide Take 14 mg by mouth daily.   tamsulosin 0.4 MG Caps capsule Commonly known as: FLOMAX Take 0.4 mg by mouth daily.   traMADol 50 MG tablet Commonly known as: ULTRAM Take 50 mg by mouth 4 (four) times daily as needed for moderate pain.        Follow-up Information     Marvin Squibb, MD. Schedule an appointment as soon as possible for a visit in 1 week(s).   Specialty: Internal Medicine Contact information: [address] [facility] [address] [phone]         Marvin Jack, MD. Go to.   Specialty: Hematology Contact information: [place] [address] [phone]                No Known Allergies  Consultations: None   Procedures/Studies: ECHOCARDIOGRAM COMPLETE  Result Date: 06/05/2022    ECHOCARDIOGRAM REPORT   Patient Name:   Marvin Carr Date of Exam: 06/05/2022 Medical Rec #:  [id number]       Height:       72.0 in Accession #:    [id number]      Weight:       272.9 lb Date of Birth:  1956/05/17       BSA:          2.432 m Patient  Age:    62 years        BP:           152/75 mmHg Patient Gender: M               HR:           77 bpm. Exam Location:  [facility] Procedure: 2D Echo, Cardiac Doppler and Color Doppler Indications:    CHF  History:        Patient has no prior history of Echocardiogram examinations.                 CAD;  Risk Factors:Hypertension and Dyslipidemia.  Sonographer:    Wenda Low Referring Phys: [id number] [place]  1. Left ventricular ejection fraction, by estimation, is 60 to 65%. The left ventricle has normal function. The left ventricle has no regional wall motion abnormalities. There is mild asymmetric left ventricular hypertrophy of the septal segment. Left ventricular diastolic parameters are indeterminate. Elevated left ventricular end-diastolic pressure.  2. Right ventricular systolic function is normal. The right ventricular size is normal. Tricuspid regurgitation signal is inadequate for assessing PA pressure.  3. Left atrial size was mildly dilated.  4. The mitral valve is grossly normal. Trivial mitral valve regurgitation.  5. The aortic valve is tricuspid. Aortic valve regurgitation is trivial. No aortic stenosis is present. Aortic valve mean gradient measures 4.0 mmHg.  6. The inferior vena cava is normal in size with greater than 50% respiratory variability, suggesting right atrial pressure of 3 mmHg. Comparison(s): No prior Echocardiogram. FINDINGS  Left Ventricle: Left ventricular ejection fraction, by estimation, is 60 to 65%. The left ventricle has normal function. The left ventricle has no regional wall motion abnormalities. The left ventricular internal cavity size was normal in size. There is  mild asymmetric left ventricular hypertrophy of the septal segment. Left ventricular diastolic parameters are indeterminate. Elevated left ventricular end-diastolic pressure. Right Ventricle: The right ventricular size is normal. No increase in right ventricular wall thickness. Right  ventricular systolic function is normal. Tricuspid regurgitation signal is inadequate for assessing PA pressure. Left Atrium: Left atrial size was mildly dilated. Right Atrium: Right atrial size was normal in size. Pericardium: There is no evidence of pericardial effusion. Mitral Valve: The mitral valve is grossly normal. Trivial mitral valve regurgitation. MV peak gradient, 8.5 mmHg. The mean mitral valve gradient is 3.0 mmHg. Tricuspid Valve: The tricuspid valve is grossly normal. Tricuspid valve regurgitation is mild. Aortic Valve: The aortic valve is tricuspid. There is mild aortic valve annular calcification. Aortic valve regurgitation is trivial. No aortic stenosis is present. Aortic valve mean gradient measures 4.0 mmHg. Aortic valve peak gradient measures 7.2 mmHg. Aortic valve area, by VTI measures 3.00 cm. Pulmonic Valve: The pulmonic valve was grossly normal. Pulmonic valve regurgitation is trivial. Aorta: The aortic root is normal in size and structure. Venous: The inferior vena cava is normal in size with greater than 50% respiratory variability, suggesting right atrial pressure of 3 mmHg. IAS/Shunts: No atrial level shunt detected by color flow Doppler.  LEFT VENTRICLE PLAX 2D LVIDd:         5.40 cm   Diastology LVIDs:         3.30 cm   LV e' medial:    7.83 cm/s LV PW:         0.90 cm   LV E/e' medial:  19.5 LV IVS:        1.10 cm   LV e' lateral:   10.00 cm/s LVOT diam:     2.00 cm   LV E/e' lateral: 15.3 LV SV:         84 LV SV Index:   34 LVOT Area:     3.14 cm  RIGHT VENTRICLE RV Basal diam:  3.70 cm RV Mid diam:    3.40 cm RV S prime:     11.00 cm/s TAPSE (M-mode): 2.9 cm LEFT ATRIUM             Index        RIGHT ATRIUM  Index LA diam:        4.50 cm 1.85 cm/m   RA Area:     20.80 cm LA Vol (A2C):   87.9 ml 36.15 ml/m  RA Volume:   58.10 ml  23.89 ml/m LA Vol (A4C):   80.9 ml 33.27 ml/m LA Biplane Vol: 88.1 ml 36.23 ml/m  AORTIC VALVE                    PULMONIC VALVE AV Area  (Vmax):    2.77 cm     PV Vmax:       1.03 m/s AV Area (Vmean):   2.69 cm     PV Peak grad:  4.2 mmHg AV Area (VTI):     3.00 cm AV Vmax:           134.00 cm/s AV Vmean:          96.200 cm/s AV VTI:            0.279 m AV Peak Grad:      7.2 mmHg AV Mean Grad:      4.0 mmHg LVOT Vmax:         118.00 cm/s LVOT Vmean:        82.500 cm/s LVOT VTI:          0.266 m LVOT/AV VTI ratio: 0.95  AORTA Ao Root diam: 3.50 cm MITRAL VALVE MV Area (PHT): 3.68 cm     SHUNTS MV Area VTI:   2.35 cm     Systemic VTI:  0.27 m MV Peak grad:  8.5 mmHg     Systemic Diam: 2.00 cm MV Mean grad:  3.0 mmHg MV Vmax:       1.46 m/s MV Vmean:      78.5 cm/s MV Decel Time: 206 msec MV E velocity: 153.00 cm/s MV A velocity: 80.00 cm/s MV E/A ratio:  1.91 Rozann Lesches MD Electronically signed by Rozann Lesches MD Signature Date/Time: 06/05/2022/1:01:07 PM    Final    DG Chest Port 1 View  Result Date: 06/04/2022 CLINICAL DATA:  Fever, possible sepsis EXAM: PORTABLE CHEST 1 VIEW COMPARISON:  02/28/2022 FINDINGS: Cardiac size is within normal limits. There are no signs of alveolar pulmonary edema. There is slight prominence of interstitial markings in both lungs. There is no focal consolidation. There is no pleural effusion or pneumothorax. Tip of right IJ chest port is seen in superior vena cava. IMPRESSION: No focal pulmonary consolidation is seen. There is no pleural effusion. There is slight prominence of interstitial markings in both lungs. This may be an apparent change due to differences in the techniques or suggest interstitial pneumonia. Electronically Signed   By: Elmer Picker M.D.   On: 06/04/2022 19:55   CT CHEST ABDOMEN PELVIS W CONTRAST  Result Date: 05/24/2022 CLINICAL DATA:  Restaging cholangiocarcinoma post chemotherapy. Abdominal discomfort. * Tracking Code: BO * EXAM: CT CHEST, ABDOMEN, AND PELVIS WITH CONTRAST TECHNIQUE: Multidetector CT imaging of the chest, abdomen and pelvis was performed following the  standard protocol during bolus administration of intravenous contrast. RADIATION DOSE REDUCTION: This exam was performed according to the departmental dose-optimization program which includes automated exposure control, adjustment of the mA and/or kV according to patient size and/or use of iterative reconstruction technique. CONTRAST:  66m OMNIPAQUE IOHEXOL 300 MG/ML  SOLN COMPARISON:  Prior examinations 03/04/2022 and 12/20/2021. FINDINGS: CT CHEST FINDINGS Cardiovascular: No acute vascular findings. A right IJ Port-A-Cath extends to the superior cavoatrial junction. Mild atherosclerosis  of the aorta and coronary arteries. The heart size is normal. There is no pericardial effusion. Mediastinum/Nodes: There are no enlarged mediastinal, hilar or axillary lymph nodes. Small mediastinal lymph nodes appear unchanged. The thyroid gland, trachea and esophagus demonstrate no significant findings. Lungs/Pleura: No pleural effusion or pneumothorax. Chronic central airway thickening and scattered subpleural reticulation. No suspicious pulmonary nodules. Musculoskeletal/Chest wall: No chest wall mass or suspicious osseous findings. CT ABDOMEN AND PELVIS FINDINGS Hepatobiliary: Cholelithiasis, gallbladder wall thickening and pericholecystic soft tissue stranding are unchanged. Further decrease in size of the low-density mass adjacent to the gallbladder within segment 4B, measuring approximately 1.8 x 1.0 cm on image 62/2 (previously 2.2 x 2.0 cm). No residual abnormality identified in the adjacent right lobe. No new or enlarging hepatic lesions are identified. No significant biliary dilatation. Pancreas: Mild fatty replacement. No focal lesion, ductal dilatation or surrounding inflammation. Spleen: Normal in size without focal abnormality. Adrenals/Urinary Tract: Both adrenal glands appear normal. The kidneys appear stable, without evidence of urinary tract calculus, hydronephrosis or suspicious lesion. The bladder appears  normal for its degree of distention. Stomach/Bowel: Enteric contrast was administered and has passed into the distal small bowel. The stomach appears unremarkable for its degree of distension. No evidence of bowel wall thickening, distention or surrounding inflammatory change. The appendix appears normal. Mildly prominent stool throughout the colon. Mild diverticulosis of the descending and sigmoid colon. Vascular/Lymphatic: There are no enlarged abdominal or pelvic lymph nodes. Mildly prominent inguinal lymph nodes bilaterally, likely reactive. Mild aortic and branch vessel atherosclerosis without acute vascular findings or aneurysm. Reproductive: The prostate gland and seminal vesicles appear unremarkable. Other: No evidence of abdominal wall mass or hernia. No recurrent ascites or peritoneal nodularity. Musculoskeletal: No acute or significant osseous findings. Degenerative disc disease at L4-5 and previous right total hip arthroplasty are noted. Stable left intergluteal lipoma. IMPRESSION: 1. Further decrease in size of small left hepatic lobe lesion adjacent to the gallbladder, consistent with response to therapy. Underlying gallbladder wall thickening appears similar, attributed to the patient's cholangiocarcinoma. 2. No significant residual peritoneal disease or new distant metastases identified. No evidence of thoracic metastatic disease. 3. Coronary and Aortic Atherosclerosis (ICD10-I70.0). Mild distal colonic diverticulosis. Electronically Signed   By: Richardean Sale M.D.   On: 05/24/2022 11:10   US Venous Img Lower Bilateral  Result Date: 05/22/2022 CLINICAL DATA:  Bilateral lower extremity pain, edema in lymphedema. History of cholangiocarcinoma. EXAM: BILATERAL LOWER EXTREMITY VENOUS DOPPLER ULTRASOUND TECHNIQUE: Gray-scale sonography with graded compression, as well as color Doppler and duplex ultrasound were performed to evaluate the lower extremity deep venous systems from the level of the  common femoral vein and including the common femoral, femoral, profunda femoral, popliteal and calf veins including the posterior tibial, peroneal and gastrocnemius veins when visible. The superficial great saphenous vein was also interrogated. Spectral Doppler was utilized to evaluate flow at rest and with distal augmentation maneuvers in the common femoral, femoral and popliteal veins. COMPARISON:  None Available. FINDINGS: RIGHT LOWER EXTREMITY Common Femoral Vein: No evidence of thrombus. Normal compressibility, respiratory phasicity and response to augmentation. Saphenofemoral Junction: No evidence of thrombus. Normal compressibility and flow on color Doppler imaging. Profunda Femoral Vein: No evidence of thrombus. Normal compressibility and flow on color Doppler imaging. Femoral Vein: No evidence of thrombus. Normal compressibility, respiratory phasicity and response to augmentation. Popliteal Vein: No evidence of thrombus. Normal compressibility, respiratory phasicity and response to augmentation. Calf Veins: No evidence of thrombus. Normal compressibility and flow on color Doppler imaging.  Superficial Great Saphenous Vein: No evidence of thrombus. Normal compressibility. Venous Reflux:  None. Other Findings: No evidence of superficial thrombophlebitis or abnormal fluid collection. LEFT LOWER EXTREMITY Common Femoral Vein: No evidence of thrombus. Normal compressibility, respiratory phasicity and response to augmentation. Saphenofemoral Junction: No evidence of thrombus. Normal compressibility and flow on color Doppler imaging. Profunda Femoral Vein: No evidence of thrombus. Normal compressibility and flow on color Doppler imaging. Femoral Vein: No evidence of thrombus. Normal compressibility, respiratory phasicity and response to augmentation. Popliteal Vein: No evidence of thrombus. Normal compressibility, respiratory phasicity and response to augmentation. Calf Veins: No evidence of thrombus. Normal  compressibility and flow on color Doppler imaging. Superficial Great Saphenous Vein: No evidence of thrombus. Normal compressibility. Venous Reflux:  None. Other Findings: No evidence of superficial thrombophlebitis or abnormal fluid collection. IMPRESSION: No evidence of deep venous thrombosis in either lower extremity. Electronically Signed   By: Aletta Edouard M.D.   On: 05/22/2022 14:21     Discharge Exam: Vitals:   06/07/22 2114 06/08/22 0509  BP: 133/73 132/73  Pulse: 73 67  Resp: 20   Temp: 98.3 F (36.8 C) 98.2 F (36.8 C)  SpO2: 97% 96%   Vitals:   06/07/22 1236 06/07/22 2114 06/08/22 0500 06/08/22 0509  BP: 121/73 133/73  132/73  Pulse: 69 73  67  Resp: 18 20    Temp: 97.7 F (36.5 C) 98.3 F (36.8 C)  98.2 F (36.8 C)  TempSrc: Oral Oral  Oral  SpO2:  97%  96%  Weight:   120.4 kg   Height:        General: Pt is alert, awake, not in acute distress Cardiovascular: RRR, S1/S2 +, no rubs, no gallops Respiratory: CTA bilaterally, no wheezing, no rhonchi Abdominal: Soft, NT, ND, bowel sounds + Extremities: no edema, no cyanosis    The results of significant diagnostics from this hospitalization (including imaging, microbiology, ancillary and laboratory) are listed below for reference.     Microbiology: Recent Results (from the past 240 hour(s))  Blood Culture (routine x 2)     Status: None (Preliminary result)   Collection Time: 06/04/22  7:19 PM   Specimen: BLOOD LEFT WRIST  Result Value Ref Range Status   Specimen Description   Final    BLOOD LEFT WRIST BOTTLES DRAWN AEROBIC AND ANAEROBIC   Special Requests Blood Culture adequate volume  Final   Culture   Final    NO GROWTH 4 DAYS Performed at [facility], [address], [place], [place] [address]    Report Status PENDING  Incomplete  Urine Culture     Status: Abnormal   Collection Time: 06/04/22  7:19 PM   Specimen: In/Out Cath Urine  Result Value Ref Range Status   Specimen Description   Final     IN/OUT CATH URINE Performed at [facility], [address] [place], [place] [address]    Special Requests   Final    NONE Performed at [facility], [address] [place], [place] [address]    Culture (A)  Final    40,000 COLONIES/mL AEROCOCCUS VIRIDANS Standardized susceptibility testing for this organism is not available. Performed at [facility], [place] [address], [place], [place] [address]    Report Status 06/07/2022 FINAL  Final  Blood Culture (routine x 2)     Status: None (Preliminary result)   Collection Time: 06/04/22  7:24 PM   Specimen: BLOOD RIGHT WRIST  Result Value Ref Range Status  Specimen Description   Final    BLOOD RIGHT WRIST BOTTLES DRAWN AEROBIC AND ANAEROBIC   Special Requests Blood Culture adequate volume  Final   Culture   Final    NO GROWTH 4 DAYS Performed at [facility], [address], [place], [place] [address]    Report Status PENDING  Incomplete  Resp Panel by RT-PCR (Flu A&B, Covid) Anterior Nasal Swab     Status: None   Collection Time: 06/04/22  7:25 PM   Specimen: Anterior Nasal Swab  Result Value Ref Range Status   SARS Coronavirus 2 by RT PCR NEGATIVE NEGATIVE Final    Comment: (NOTE) SARS-CoV-2 target nucleic acids are NOT DETECTED.  The SARS-CoV-2 RNA is generally detectable in upper respiratory specimens during the acute phase of infection. The lowest concentration of SARS-CoV-2 viral copies this assay can detect is 138 copies/mL. A negative result does not preclude SARS-Cov-2 infection and should not be used as the sole basis for treatment or other patient management decisions. A negative result may occur with  improper specimen collection/handling, submission of specimen other than nasopharyngeal swab, presence of viral mutation(s) within the areas targeted by this assay, and inadequate number of viral copies(<138 copies/mL). A negative result must be combined with clinical observations, patient history, and  epidemiological information. The expected result is Negative.  Fact Sheet for Patients:  EntrepreneurPulse.com.au  Fact Sheet for Healthcare Providers:  IncredibleEmployment.be  This test is no t yet approved or cleared by the [place] FDA and  has been authorized for detection and/or diagnosis of SARS-CoV-2 by FDA under an Emergency Use Authorization (EUA). This EUA will remain  in effect (meaning this test can be used) for the duration of the COVID-19 declaration under Section 564(b)(1) of the Act, 21 U.S.C.section 360bbb-3(b)(1), unless the authorization is terminated  or revoked sooner.       Influenza A by PCR NEGATIVE NEGATIVE Final   Influenza B by PCR NEGATIVE NEGATIVE Final    Comment: (NOTE) The Xpert Xpress SARS-CoV-2/FLU/RSV plus assay is intended as an aid in the diagnosis of influenza from Nasopharyngeal swab specimens and should not be used as a sole basis for treatment. Nasal washings and aspirates are unacceptable for Xpert Xpress SARS-CoV-2/FLU/RSV testing.  Fact Sheet for Patients: EntrepreneurPulse.com.au  Fact Sheet for Healthcare Providers: IncredibleEmployment.be  This test is not yet approved or cleared by the [place] FDA and has been authorized for detection and/or diagnosis of SARS-CoV-2 by FDA under an Emergency Use Authorization (EUA). This EUA will remain in effect (meaning this test can be used) for the duration of the COVID-19 declaration under Section 564(b)(1) of the Act, 21 U.S.C. section 360bbb-3(b)(1), unless the authorization is terminated or revoked.  Performed at [facility], [address] [place], [place] [address]   MRSA Next Gen by PCR, Nasal     Status: None   Collection Time: 06/05/22  6:57 AM   Specimen: Nasal Mucosa; Nasal Swab  Result Value Ref Range Status   MRSA by PCR Next Gen NOT DETECTED NOT DETECTED Final    Comment: (NOTE) The  GeneXpert MRSA Assay (FDA approved for NASAL specimens only), is one component of a comprehensive MRSA colonization surveillance program. It is not intended to diagnose MRSA infection nor to guide or monitor treatment for MRSA infections. Test performance is not FDA approved in patients less than 19 years old. Performed at [facility], [address] [place], [place] [address]      Labs: BNP (last 3  results) No results for input(s): "BNP" in the last 8760 hours. Basic Metabolic Panel: Recent Labs  Lab 06/04/22 1919 06/05/22 0602 06/06/22 0436 06/07/22 0839 06/08/22 0442  NA 131* 135 138 137 135  K 4.2 3.7 3.7 3.9 4.1  CL 102 104 107 107 103  CO2 '22 24 23 26 26  '$ GLUCOSE 142* 102* 112* 108* 108*  BUN 28* 25* '23 19 21  '$ CREATININE 1.40* 1.32* 1.10 0.99 1.17  CALCIUM 8.4* 8.2* 8.4* 8.7* 8.6*  MG  --   --  1.7 1.8 2.0   Liver Function Tests: Recent Labs  Lab 06/04/22 1919  AST 23  ALT 20  ALKPHOS 50  BILITOT 0.6  PROT 6.2*  ALBUMIN 3.1*   No results for input(s): "LIPASE", "AMYLASE" in the last 168 hours. No results for input(s): "AMMONIA" in the last 168 hours. CBC: Recent Labs  Lab 06/04/22 1919 06/05/22 0602 06/06/22 0436 06/07/22 0839  WBC 13.0* 9.4 3.9* 5.3  NEUTROABS 12.1*  --   --   --   HGB 9.5* 9.3* 8.8* 9.6*  HCT 28.6* 29.3* 26.9* 28.6*  MCV 98.3 102.1* 100.0 98.6  PLT 205 172 140* 129*   Cardiac Enzymes: No results for input(s): "CKTOTAL", "CKMB", "CKMBINDEX", "TROPONINI" in the last 168 hours. BNP: Invalid input(s): "POCBNP" CBG: Recent Labs  Lab 06/07/22 0723 06/07/22 1107 06/07/22 1614 06/07/22 2118 06/08/22 0739  GLUCAP 113* 124* 123* 125* 104*   D-Dimer No results for input(s): "DDIMER" in the last 72 hours. Hgb A1c No results for input(s): "HGBA1C" in the last 72 hours. Lipid Profile No results for input(s): "CHOL", "HDL", "LDLCALC", "TRIG", "CHOLHDL", "LDLDIRECT" in the last 72 hours. Thyroid function studies No results for  input(s): "TSH", "T4TOTAL", "T3FREE", "THYROIDAB" in the last 72 hours.  Invalid input(s): "FREET3" Anemia work up No results for input(s): "VITAMINB12", "FOLATE", "FERRITIN", "TIBC", "IRON", "RETICCTPCT" in the last 72 hours. Urinalysis    Component Value Date/Time   COLORURINE YELLOW 06/04/2022 1919   APPEARANCEUR CLEAR 06/04/2022 1919   LABSPEC 1.016 06/04/2022 1919   PHURINE 7.0 06/04/2022 1919   GLUCOSEU NEGATIVE 06/04/2022 1919   HGBUR NEGATIVE 06/04/2022 [place] NEGATIVE 06/04/2022 [place] NEGATIVE 06/04/2022 1919   PROTEINUR 30 (A) 06/04/2022 1919   NITRITE NEGATIVE 06/04/2022 1919   LEUKOCYTESUR NEGATIVE 06/04/2022 1919   Sepsis Labs Recent Labs  Lab 06/04/22 1919 06/05/22 0602 06/06/22 0436 06/07/22 0839  WBC 13.0* 9.4 3.9* 5.3   Microbiology Recent Results (from the past 240 hour(s))  Blood Culture (routine x 2)     Status: None (Preliminary result)   Collection Time: 06/04/22  7:19 PM   Specimen: BLOOD LEFT WRIST  Result Value Ref Range Status   Specimen Description   Final    BLOOD LEFT WRIST BOTTLES DRAWN AEROBIC AND ANAEROBIC   Special Requests Blood Culture adequate volume  Final   Culture   Final    NO GROWTH 4 DAYS Performed at [facility], [address] [place], [place] [address]    Report Status PENDING  Incomplete  Urine Culture     Status: Abnormal   Collection Time: 06/04/22  7:19 PM   Specimen: In/Out Cath Urine  Result Value Ref Range Status   Specimen Description   Final    IN/OUT CATH URINE Performed at [facility], [address], [place], [place] [address]    Special Requests   Final    NONE Performed at [facility], [address] [place],  [place] [address]    Culture (A)  Final    40,000 COLONIES/mL AEROCOCCUS VIRIDANS Standardized susceptibility testing for this organism is not available. Performed at [facility], [place] [address], [place], [place] [address]    Report Status 06/07/2022 FINAL  Final   Blood Culture (routine x 2)     Status: None (Preliminary result)   Collection Time: 06/04/22  7:24 PM   Specimen: BLOOD RIGHT WRIST  Result Value Ref Range Status   Specimen Description   Final    BLOOD RIGHT WRIST BOTTLES DRAWN AEROBIC AND ANAEROBIC   Special Requests Blood Culture adequate volume  Final   Culture   Final    NO GROWTH 4 DAYS Performed at [facility], [address], [place], [place] [address]    Report Status PENDING  Incomplete  Resp Panel by RT-PCR (Flu A&B, Covid) Anterior Nasal Swab     Status: None   Collection Time: 06/04/22  7:25 PM   Specimen: Anterior Nasal Swab  Result Value Ref Range Status   SARS Coronavirus 2 by RT PCR NEGATIVE NEGATIVE Final    Comment: (NOTE) SARS-CoV-2 target nucleic acids are NOT DETECTED.  The SARS-CoV-2 RNA is generally detectable in upper respiratory specimens during the acute phase of infection. The lowest concentration of SARS-CoV-2 viral copies this assay can detect is 138 copies/mL. A negative result does not preclude SARS-Cov-2 infection and should not be used as the sole basis for treatment or other patient management decisions. A negative result may occur with  improper specimen collection/handling, submission of specimen other than nasopharyngeal swab, presence of viral mutation(s) within the areas targeted by this assay, and inadequate number of viral copies(<138 copies/mL). A negative result must be combined with clinical observations, patient history, and epidemiological information. The expected result is Negative.  Fact Sheet for Patients:  EntrepreneurPulse.com.au  Fact Sheet for Healthcare Providers:  IncredibleEmployment.be  This test is no t yet approved or cleared by the [place] FDA and  has been authorized for detection and/or diagnosis of SARS-CoV-2 by FDA under an Emergency Use Authorization (EUA). This EUA will remain  in effect (meaning this test can be  used) for the duration of the COVID-19 declaration under Section 564(b)(1) of the Act, 21 U.S.C.section 360bbb-3(b)(1), unless the authorization is terminated  or revoked sooner.       Influenza A by PCR NEGATIVE NEGATIVE Final   Influenza B by PCR NEGATIVE NEGATIVE Final    Comment: (NOTE) The Xpert Xpress SARS-CoV-2/FLU/RSV plus assay is intended as an aid in the diagnosis of influenza from Nasopharyngeal swab specimens and should not be used as a sole basis for treatment. Nasal washings and aspirates are unacceptable for Xpert Xpress SARS-CoV-2/FLU/RSV testing.  Fact Sheet for Patients: EntrepreneurPulse.com.au  Fact Sheet for Healthcare Providers: IncredibleEmployment.be  This test is not yet approved or cleared by the [place] FDA and has been authorized for detection and/or diagnosis of SARS-CoV-2 by FDA under an Emergency Use Authorization (EUA). This EUA will remain in effect (meaning this test can be used) for the duration of the COVID-19 declaration under Section 564(b)(1) of the Act, 21 U.S.C. section 360bbb-3(b)(1), unless the authorization is terminated or revoked.  Performed at [facility], [address] [place], [place] [address]   MRSA Next Gen by PCR, Nasal     Status: None   Collection Time: 06/05/22  6:57 AM   Specimen: Nasal Mucosa; Nasal Swab  Result Value Ref Range Status   MRSA by  PCR Next Gen NOT DETECTED NOT DETECTED Final    Comment: (NOTE) The GeneXpert MRSA Assay (FDA approved for NASAL specimens only), is one component of a comprehensive MRSA colonization surveillance program. It is not intended to diagnose MRSA infection nor to guide or monitor treatment for MRSA infections. Test performance is not FDA approved in patients less than 79 years old. Performed at [facility], [address] [place], [place] [address]      Time coordinating discharge: 35 minutes  SIGNED:   Rodena Goldmann, DO [facility] 06/08/2022, 9:26 AM  If 7PM-7AM, please contact night-coverage [email or web address]

## 2022-06-09 LAB — CULTURE, BLOOD (ROUTINE X 2)
Culture: NO GROWTH
Culture: NO GROWTH
Special Requests: ADEQUATE
Special Requests: ADEQUATE

## 2022-06-12 ENCOUNTER — Encounter: Payer: Self-pay | Admitting: *Deleted

## 2022-06-12 NOTE — Progress Notes (Signed)
Marvin Carr was contacted by telephone to verify understanding of discharge instructions status post their most recent discharge from the hospital on the date:  06/08/22.  Inpatient discharge AVS was re-reviewed with patient, along with cancer center appointments.  Verification of understanding for oncology specific follow-up was validated using the Teach Back method.    Transportation to appointments were confirmed for the patient as being self/caregiver.  [name] questions were addressed to their satisfaction upon completion of this post discharge follow-up call for outpatient oncology. Will add follow up appointment with Dr. Delton Coombes to assess patient's status prior to chemotherapy.

## 2022-06-14 ENCOUNTER — Inpatient Hospital Stay (HOSPITAL_BASED_OUTPATIENT_CLINIC_OR_DEPARTMENT_OTHER): Payer: Medicare Other | Admitting: Hematology

## 2022-06-14 ENCOUNTER — Inpatient Hospital Stay: Payer: Medicare Other

## 2022-06-14 ENCOUNTER — Inpatient Hospital Stay: Payer: Medicare Other | Attending: Hematology

## 2022-06-14 ENCOUNTER — Encounter: Payer: Self-pay | Admitting: Hematology

## 2022-06-14 DIAGNOSIS — Z808 Family history of malignant neoplasm of other organs or systems: Secondary | ICD-10-CM | POA: Diagnosis not present

## 2022-06-14 DIAGNOSIS — G629 Polyneuropathy, unspecified: Secondary | ICD-10-CM | POA: Insufficient documentation

## 2022-06-14 DIAGNOSIS — D6481 Anemia due to antineoplastic chemotherapy: Secondary | ICD-10-CM | POA: Diagnosis not present

## 2022-06-14 DIAGNOSIS — R970 Elevated carcinoembryonic antigen [CEA]: Secondary | ICD-10-CM | POA: Diagnosis not present

## 2022-06-14 DIAGNOSIS — C221 Intrahepatic bile duct carcinoma: Secondary | ICD-10-CM

## 2022-06-14 DIAGNOSIS — Z8042 Family history of malignant neoplasm of prostate: Secondary | ICD-10-CM | POA: Insufficient documentation

## 2022-06-14 DIAGNOSIS — C787 Secondary malignant neoplasm of liver and intrahepatic bile duct: Secondary | ICD-10-CM

## 2022-06-14 DIAGNOSIS — Z8551 Personal history of malignant neoplasm of bladder: Secondary | ICD-10-CM | POA: Diagnosis not present

## 2022-06-14 DIAGNOSIS — C786 Secondary malignant neoplasm of retroperitoneum and peritoneum: Secondary | ICD-10-CM | POA: Diagnosis not present

## 2022-06-14 DIAGNOSIS — T451X5A Adverse effect of antineoplastic and immunosuppressive drugs, initial encounter: Secondary | ICD-10-CM | POA: Diagnosis not present

## 2022-06-14 DIAGNOSIS — R531 Weakness: Secondary | ICD-10-CM | POA: Diagnosis not present

## 2022-06-14 DIAGNOSIS — Z809 Family history of malignant neoplasm, unspecified: Secondary | ICD-10-CM | POA: Insufficient documentation

## 2022-06-14 DIAGNOSIS — Z95828 Presence of other vascular implants and grafts: Secondary | ICD-10-CM

## 2022-06-14 DIAGNOSIS — I10 Essential (primary) hypertension: Secondary | ICD-10-CM | POA: Diagnosis not present

## 2022-06-14 DIAGNOSIS — G479 Sleep disorder, unspecified: Secondary | ICD-10-CM | POA: Diagnosis not present

## 2022-06-14 DIAGNOSIS — Z79899 Other long term (current) drug therapy: Secondary | ICD-10-CM | POA: Insufficient documentation

## 2022-06-14 DIAGNOSIS — Z87891 Personal history of nicotine dependence: Secondary | ICD-10-CM | POA: Diagnosis not present

## 2022-06-14 LAB — CBC WITH DIFFERENTIAL/PLATELET
Abs Immature Granulocytes: 0.04 10*3/uL (ref 0.00–0.07)
Basophils Absolute: 0 10*3/uL (ref 0.0–0.1)
Basophils Relative: 1 %
Eosinophils Absolute: 0.3 10*3/uL (ref 0.0–0.5)
Eosinophils Relative: 5 %
HCT: 32.8 % — ABNORMAL LOW (ref 39.0–52.0)
Hemoglobin: 10.5 g/dL — ABNORMAL LOW (ref 13.0–17.0)
Immature Granulocytes: 1 %
Lymphocytes Relative: 25 %
Lymphs Abs: 1.2 10*3/uL (ref 0.7–4.0)
MCH: 32.4 pg (ref 26.0–34.0)
MCHC: 32 g/dL (ref 30.0–36.0)
MCV: 101.2 fL — ABNORMAL HIGH (ref 80.0–100.0)
Monocytes Absolute: 0.8 10*3/uL (ref 0.1–1.0)
Monocytes Relative: 17 %
Neutro Abs: 2.5 10*3/uL (ref 1.7–7.7)
Neutrophils Relative %: 51 %
Platelets: 163 10*3/uL (ref 150–400)
RBC: 3.24 MIL/uL — ABNORMAL LOW (ref 4.22–5.81)
RDW: 14 % (ref 11.5–15.5)
WBC: 4.8 10*3/uL (ref 4.0–10.5)
nRBC: 0 % (ref 0.0–0.2)

## 2022-06-14 LAB — COMPREHENSIVE METABOLIC PANEL
ALT: 18 U/L (ref 0–44)
AST: 16 U/L (ref 15–41)
Albumin: 3.4 g/dL — ABNORMAL LOW (ref 3.5–5.0)
Alkaline Phosphatase: 71 U/L (ref 38–126)
Anion gap: 8 (ref 5–15)
BUN: 42 mg/dL — ABNORMAL HIGH (ref 8–23)
CO2: 26 mmol/L (ref 22–32)
Calcium: 9.1 mg/dL (ref 8.9–10.3)
Chloride: 102 mmol/L (ref 98–111)
Creatinine, Ser: 1.34 mg/dL — ABNORMAL HIGH (ref 0.61–1.24)
GFR, Estimated: 58 mL/min — ABNORMAL LOW (ref >60)
Glucose, Bld: 128 mg/dL — ABNORMAL HIGH (ref 70–99)
Potassium: 4.4 mmol/L (ref 3.5–5.1)
Sodium: 136 mmol/L (ref 135–145)
Total Bilirubin: 0.4 mg/dL (ref 0.3–1.2)
Total Protein: 6.5 g/dL (ref 6.5–8.1)

## 2022-06-14 LAB — MAGNESIUM: Magnesium: 2.1 mg/dL (ref 1.7–2.4)

## 2022-06-14 MED ORDER — HEPARIN SOD (PORK) LOCK FLUSH 100 UNIT/ML IV SOLN
500.0000 [IU] | Freq: Once | INTRAVENOUS | Status: AC
  Administered 2022-06-14: 500 [IU] via INTRAVENOUS

## 2022-06-14 MED ORDER — SODIUM CHLORIDE 0.9% FLUSH
10.0000 mL | Freq: Once | INTRAVENOUS | Status: AC
  Administered 2022-06-14: 10 mL via INTRAVENOUS

## 2022-06-14 NOTE — Patient Instructions (Addendum)
[place] at [facility] Discharge Instructions   You were seen and examined today by Dr. Delton Coombes.  He reviewed your lab work which is normal/stable.  Increase your protein intake to at least 80 g per day.  We will hold your treatment today to allow the swelling in your legs to go down and give you additional time to heal.   Return as scheduled in 2 weeks.    Thank you for choosing [place] at [facility] to provide your oncology and hematology care.  To afford each patient quality time with our provider, please arrive at least 15 minutes before your scheduled appointment time.   If you have a lab appointment with the [facility] please come in thru the Main Entrance and check in at the main information desk.  You need to re-schedule your appointment should you arrive 10 or more minutes late.  We strive to give you quality time with our providers, and arriving late affects you and other patients whose appointments are after yours.  Also, if you no show three or more times for appointments you may be dismissed from the clinic at the providers discretion.     Again, thank you for choosing [facility].  Our hope is that these requests will decrease the amount of time that you wait before being seen by our physicians.       _____________________________________________________________  Should you have questions after your visit to [facility], please contact our office at [phone] and follow the prompts.  Our office hours are 8:00 a.m. and 4:30 p.m. Monday - Friday.  Please note that voicemails left after 4:00 p.m. may not be returned until the following business day.  We are closed weekends and major holidays.  You do have access to a nurse 24-7, just call the main number to the clinic [phone] and do not press any options, hold on the line and a nurse will answer the phone.    For prescription refill  requests, have your pharmacy contact our office and allow 72 hours.    Due to Covid, you will need to wear a mask upon entering the hospital. If you do not have a mask, a mask will be given to you at the Main Entrance upon arrival. For doctor visits, patients may have 1 support person age 45 or older with them. For treatment visits, patients can not have anyone with them due to social distancing guidelines and our immunocompromised population.

## 2022-06-14 NOTE — Patient Instructions (Signed)
[facility]  Discharge Instructions: Thank you for choosing [facility] to provide your oncology and hematology care.  If you have a lab appointment with the Cancer Center, please come in thru the Main Entrance and check in at the main information desk.  Wear comfortable clothing and clothing appropriate for easy access to any Portacath or PICC line.   We strive to give you quality time with your provider. You may need to reschedule your appointment if you arrive late (15 or more minutes).  Arriving late affects you and other patients whose appointments are after yours.  Also, if you miss three or more appointments without notifying the office, you may be dismissed from the clinic at the provider's discretion.      For prescription refill requests, have your pharmacy contact our office and allow 72 hours for refills to be completed.    Today you received the following port flushed, return as scheduled.   To help prevent nausea and vomiting after your treatment, we encourage you to take your nausea medication as directed.  BELOW ARE SYMPTOMS THAT SHOULD BE REPORTED IMMEDIATELY: *FEVER GREATER THAN 100.4 F (38 C) OR HIGHER *CHILLS OR SWEATING *NAUSEA AND VOMITING THAT IS NOT CONTROLLED WITH YOUR NAUSEA MEDICATION *UNUSUAL SHORTNESS OF BREATH *UNUSUAL BRUISING OR BLEEDING *URINARY PROBLEMS (pain or burning when urinating, or frequent urination) *BOWEL PROBLEMS (unusual diarrhea, constipation, pain near the anus) TENDERNESS IN MOUTH AND THROAT WITH OR WITHOUT PRESENCE OF ULCERS (sore throat, sores in mouth, or a toothache) UNUSUAL RASH, SWELLING OR PAIN  UNUSUAL VAGINAL DISCHARGE OR ITCHING   Items with * indicate a potential emergency and should be followed up as soon as possible or go to the Emergency Department if any problems should occur.  Please show the CHEMOTHERAPY ALERT CARD or IMMUNOTHERAPY ALERT CARD at check-in to the Emergency Department and  triage nurse.  Should you have questions after your visit or need to cancel or reschedule your appointment, please contact [facility] [phone]  and follow the prompts.  Office hours are 8:00 a.m. to 4:30 p.m. Monday - Friday. Please note that voicemails left after 4:00 p.m. may not be returned until the following business day.  We are closed weekends and major holidays. You have access to a nurse at all times for urgent questions. Please call the main number to the clinic [phone] and follow the prompts.  For any non-urgent questions, you may also contact your provider using MyChart. We now offer e-Visits for anyone 18 and older to request care online for non-urgent symptoms. For details visit [email or web address].   Also download the MyChart app! Go to the app store, search "MyChart", open the app, select [facility], and log in with your MyChart username and password.  Masks are optional in the cancer centers. If you would like for your care team to wear a mask while they are taking care of you, please let them know. You may have one support person who is at least 67 years old accompany you for your appointments.  

## 2022-06-14 NOTE — Progress Notes (Signed)
No treatment today per Dr. Delton Coombes, patient will return to clinic in 2 weeks for possible treatment.  Port flushed with good blood return noted. No bruising or swelling at site. Bandaid applied and patient discharged in satisfactory condition. VVS stable with no signs or symptoms of distressed noted.

## 2022-06-19 ENCOUNTER — Other Ambulatory Visit: Payer: Self-pay | Admitting: Physician Assistant

## 2022-06-19 ENCOUNTER — Other Ambulatory Visit (HOSPITAL_COMMUNITY): Payer: Self-pay | Admitting: Hematology

## 2022-06-19 DIAGNOSIS — K30 Functional dyspepsia: Secondary | ICD-10-CM

## 2022-06-23 ENCOUNTER — Encounter: Payer: Self-pay | Admitting: Hematology

## 2022-06-23 NOTE — Progress Notes (Signed)
[facility] [address], [place] [address]   CLINIC:  Medical Oncology/Hematology  PCP:  Celene Squibb, MD [address] 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  PRIOR THERAPY: none  NGS Results: No targetable mutations.  PD-L1 (QB341) negative.  MSI-stable.  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 Dakoda Laventure, a 66 y.o. male, seen for cholangiocarcinoma and toxicity assessment prior to next cycle of chemotherapy. He reports numbness in the bottom of the feet is stable.  He is taking Lasix 40 mg in the mornings.  He was hospitalized from 06/04/2022 to 06/08/2022 with cellulitis.  He took last dose of Keflex this morning.  Ringing in the ears which she had all his life has been stable.  No new pains reported.  REVIEW OF SYSTEMS:  Review of Systems  Constitutional:  Negative for appetite change, fatigue and unexpected weight change.  HENT:   Negative for mouth sores (healed) and tinnitus.   Neurological:  Positive for numbness (Bottom of the feet stable).  Psychiatric/Behavioral:  Negative for sleep disturbance.   All other systems reviewed and are negative.   PAST MEDICAL/SURGICAL  HISTORY:  Past Medical History:  Diagnosis Date   Anxiety    Bladder cancer ([place]) 09/11/2009   CAD (coronary artery disease), native coronary artery    a. Mildly elevated troponin 03/2013, cath with nonobstructive disease including 50% AV groove distal stenosis before large OM   Essential hypertension    Headache(784.0)    History of migraines   Hyperglycemia    Mixed hyperlipidemia    Neuropathy    Obesity    Port-A-Cath in place 09/30/2021   Pre-diabetes    Seasonal allergies    Sleep apnea    On CPAP   Past Surgical History:  Procedure Laterality Date   BIOPSY  09/23/2021   Procedure: BIOPSY;  Surgeon: Harvel Quale, MD;  [facility];  Service: Gastroenterology;;   COLONOSCOPY  06/28/2011   Procedure: COLONOSCOPY;  Surgeon: Rogene Houston, MD;  [facility];  Service: Endoscopy;  Laterality: N/A;   COLONOSCOPY WITH PROPOFOL N/A 09/23/2021   Procedure: COLONOSCOPY WITH PROPOFOL;  Surgeon: Harvel Quale, MD;  [facility];  Service: Gastroenterology;  Laterality: N/A;  940   ESOPHAGOGASTRODUODENOSCOPY (EGD) WITH PROPOFOL N/A 09/23/2021   Procedure: ESOPHAGOGASTRODUODENOSCOPY (EGD) WITH PROPOFOL;  Surgeon: Harvel Quale, MD;  [facility];  Service: Gastroenterology;  Laterality: N/A;   IR IMAGING GUIDED PORT INSERTION  09/28/2021   IR PARACENTESIS  09/28/2021   JOINT REPLACEMENT Right    hip   LEFT HEART CATHETERIZATION WITH CORONARY ANGIOGRAM N/A 03/31/2013   Procedure: LEFT HEART CATHETERIZATION WITH CORONARY ANGIOGRAM;  Surgeon:  Minus Breeding, MD;  Location: [facility] CATH LAB;  Service: Cardiovascular;  Laterality: N/A;   POLYPECTOMY  09/23/2021   Procedure: POLYPECTOMY;  Surgeon: Harvel Quale, MD;  [facility];  Service: Gastroenterology;;   TURBT  09/11/2009    SOCIAL HISTORY:  Social History   Socioeconomic History   Marital status: Divorced    Spouse name: Not on file    Number of children: 0   Years of education: college   Highest education level: Not on file  Occupational History    Employer: SELF-EMPLOYED  Tobacco Use   Smoking status: Former    Packs/day: 0.50    Years: 10.00    Total pack years: 5.00    Types: Cigarettes    Quit date: 07/10/2011    Years since quitting: 10.9   Smokeless tobacco: Never   Tobacco comments:    Quit several yrs prior to 03/2013.  Vaping Use   Vaping Use: Never used  Substance and Sexual Activity   Alcohol use: Yes    Alcohol/week: 0.0 standard drinks of alcohol    Comment: Occasional   Drug use: No   Sexual activity: Not on file  Other Topics Concern   Not on file  Social History Narrative   Not on file   Social Determinants of Health   Financial Resource Strain: Not on file  Food Insecurity: No Food Insecurity (06/05/2022)   Hunger Vital Sign    Worried About Running Out of Food in the Last Year: Never true    Ran Out of Food in the Last Year: Never true  Transportation Needs: No Transportation Needs (06/05/2022)   PRAPARE - Hydrologist (Medical): No    Lack of Transportation (Non-Medical): No  Physical Activity: Not on file  Stress: Not on file  Social Connections: Not on file  Intimate Partner Violence: Not At Risk (06/05/2022)   Humiliation, Afraid, Rape, and Kick questionnaire    Fear of Current or Ex-Partner: No    Emotionally Abused: No    Physically Abused: No    Sexually Abused: No    FAMILY HISTORY:  Family History  Problem Relation Age of Onset   COPD Father     CURRENT MEDICATIONS:  Current Outpatient Medications  Medication Sig Dispense Refill   amLODipine (NORVASC) 5 MG tablet Take 5 mg by mouth daily.     aspirin EC 81 MG tablet Take 81 mg by mouth daily.     buPROPion (WELLBUTRIN XL) 300 MG 24 hr tablet Take 300 mg by mouth daily.     CISPLATIN IV Inject into the vein once a week. Day 1, Day 8 q 21 days Every other week.     cyclobenzaprine  (FLEXERIL) 10 MG tablet Take 10 mg by mouth at bedtime as needed for muscle spasms.     Durvalumab (IMFINZI IV) Inject into the vein every 21 ( twenty-one) days.     Elastic Bandages & Supports (ELASTIC BANDAGE 6") MISC Use compression wraps during the day for leg swelling 4 each 0   Eszopiclone 3 MG TABS Take 3 mg by mouth at bedtime. Take immediately before bedtime     famotidine (PEPCID) 20 MG tablet TAKE (1) TABLET BY MOUTH AT BEDTIME. (Patient taking differently: Take 20 mg by mouth daily.) 30 tablet 0   fenofibrate 160 MG tablet Take 160 mg by mouth daily.     FLUoxetine (PROZAC) 10 MG capsule Take 10 mg by mouth daily.  furosemide (LASIX) 40 MG tablet Take 1 tablet (40 mg total) by mouth daily. 30 tablet 1   gabapentin (NEURONTIN) 300 MG capsule Take 1 capsule (300 mg total) by mouth 3 (three) times daily. 90 capsule 3   Gauze Pads & Dressings (ABDOMINAL PAD) 8"X10" PADS Use for leaking fluid from leg swelling 360 each 0   Gemcitabine HCl (GEMZAR IV) Inject into the vein once a week. Day 1, Day 8 q 21 days Every other week     magnesium oxide (MAG-OX) 400 (240 Mg) MG tablet Take 1 tablet (400 mg total) by mouth 2 (two) times daily. (Patient taking differently: Take 400 mg by mouth 3 (three) times daily.) 90 tablet 6   metFORMIN (GLUCOPHAGE-XR) 500 MG 24 hr tablet Take 500 mg by mouth 2 (two) times daily.     nitroGLYCERIN (NITROSTAT) 0.4 MG SL tablet Place 1 tablet (0.4 mg total) under the tongue every 5 (five) minutes as needed for chest pain. 25 tablet 3   olmesartan (BENICAR) 40 MG tablet Take 1 tablet (40 mg total) by mouth daily. 30 tablet 6   omeprazole (PRILOSEC) 40 MG capsule Take 40 mg by mouth daily as needed (reflux).     pantoprazole (PROTONIX) 40 MG tablet Take 1 tablet (40 mg total) by mouth daily. (Patient taking differently: Take 40 mg by mouth daily as needed (reflux).) 90 tablet 3   pramipexole (MIRAPEX) 0.75 MG tablet Take 1 tablet (0.75 mg total) by mouth 3 (three)  times daily. 90 tablet 0   RYBELSUS 7 MG TABS Take 14 mg by mouth daily.     tamsulosin (FLOMAX) 0.4 MG CAPS capsule Take 0.4 mg by mouth daily.     traMADol (ULTRAM) 50 MG tablet Take 50 mg by mouth 4 (four) times daily as needed for moderate pain.     No current facility-administered medications for this visit.   Facility-Administered Medications Ordered in Other Visits  Medication Dose Route Frequency Provider Last Rate Last Admin   magnesium sulfate 2 GM/50ML IVPB             ALLERGIES:  No Known Allergies  PHYSICAL EXAM:  Performance status (ECOG): 0 - Asymptomatic  There were no vitals filed for this visit. Wt Readings from Last 3 Encounters:  06/14/22 263 lb 9.6 oz (119.6 kg)  06/08/22 265 lb 6.9 oz (120.4 kg)  05/30/22 264 lb 14.4 oz (120.2 kg)   Physical Exam Vitals reviewed.  Constitutional:      Appearance: Normal appearance.  Cardiovascular:     Rate and Rhythm: Normal rate and regular rhythm.     Pulses: Normal pulses.     Heart sounds: Normal heart sounds.  Pulmonary:     Effort: Pulmonary effort is normal.     Breath sounds: Normal breath sounds.  Neurological:     General: No focal deficit present.     Mental Status: He is alert and oriented to person, place, and time.  Psychiatric:        Mood and Affect: Mood normal.        Behavior: Behavior normal.     LABORATORY DATA:  I have reviewed the labs as listed.     Latest Ref Rng & Units 06/14/2022    7:56 AM 06/07/2022    8:39 AM 06/06/2022    4:36 AM  CBC  WBC 4.0 - 10.5 K/uL 4.8  5.3  3.9   Hemoglobin 13.0 - 17.0 g/dL 10.5  9.6  8.8   Hematocrit  39.0 - 52.0 % 32.8  28.6  26.9   Platelets 150 - 400 K/uL 163  129  140       Latest Ref Rng & Units 06/14/2022    7:56 AM 06/08/2022    4:42 AM 06/07/2022    8:39 AM  CMP  Glucose 70 - 99 mg/dL 128  108  108   BUN 8 - 23 mg/dL 42  21  19   Creatinine 0.61 - 1.24 mg/dL 1.34  1.17  0.99   Sodium 135 - 145 mmol/L 136  135  137   Potassium 3.5 - 5.1  mmol/L 4.4  4.1  3.9   Chloride 98 - 111 mmol/L 102  103  107   CO2 22 - 32 mmol/L '26  26  26   ' Calcium 8.9 - 10.3 mg/dL 9.1  8.6  8.7   Total Protein 6.5 - 8.1 g/dL 6.5     Total Bilirubin 0.3 - 1.2 mg/dL 0.4     Alkaline Phos 38 - 126 U/L 71     AST 15 - 41 U/L 16     ALT 0 - 44 U/L 18       DIAGNOSTIC IMAGING:  I have independently reviewed the scans and discussed with the patient. ECHOCARDIOGRAM COMPLETE  Result Date: 06/05/2022    ECHOCARDIOGRAM REPORT   Patient Name:   Marvin Carr Date of Exam: 06/05/2022 Medical Rec #:  [id number]       Height:       72.0 in Accession #:    [id number]      Weight:       272.9 lb Date of Birth:  Jun 17, 1956       BSA:          2.432 m Patient Age:    66 years        BP:           152/75 mmHg Patient Gender: M               HR:           77 bpm. Exam Location:  [facility] Procedure: 2D Echo, Cardiac Doppler and Color Doppler Indications:    CHF  History:        Patient has no prior history of Echocardiogram examinations.                 CAD; Risk Factors:Hypertension and Dyslipidemia.  Sonographer:    Wenda Low Referring Phys: [id number] [place]  1. Left ventricular ejection fraction, by estimation, is 60 to 65%. The left ventricle has normal function. The left ventricle has no regional wall motion abnormalities. There is mild asymmetric left ventricular hypertrophy of the septal segment. Left ventricular diastolic parameters are indeterminate. Elevated left ventricular end-diastolic pressure.  2. Right ventricular systolic function is normal. The right ventricular size is normal. Tricuspid regurgitation signal is inadequate for assessing PA pressure.  3. Left atrial size was mildly dilated.  4. The mitral valve is grossly normal. Trivial mitral valve regurgitation.  5. The aortic valve is tricuspid. Aortic valve regurgitation is trivial. No aortic stenosis is present. Aortic valve mean gradient measures 4.0 mmHg.  6. The inferior vena cava  is normal in size with greater than 50% respiratory variability, suggesting right atrial pressure of 3 mmHg. Comparison(s): No prior Echocardiogram. FINDINGS  Left Ventricle: Left ventricular ejection fraction, by estimation, is 60 to 65%. The left ventricle has normal function. The left ventricle has  no regional wall motion abnormalities. The left ventricular internal cavity size was normal in size. There is  mild asymmetric left ventricular hypertrophy of the septal segment. Left ventricular diastolic parameters are indeterminate. Elevated left ventricular end-diastolic pressure. Right Ventricle: The right ventricular size is normal. No increase in right ventricular wall thickness. Right ventricular systolic function is normal. Tricuspid regurgitation signal is inadequate for assessing PA pressure. Left Atrium: Left atrial size was mildly dilated. Right Atrium: Right atrial size was normal in size. Pericardium: There is no evidence of pericardial effusion. Mitral Valve: The mitral valve is grossly normal. Trivial mitral valve regurgitation. MV peak gradient, 8.5 mmHg. The mean mitral valve gradient is 3.0 mmHg. Tricuspid Valve: The tricuspid valve is grossly normal. Tricuspid valve regurgitation is mild. Aortic Valve: The aortic valve is tricuspid. There is mild aortic valve annular calcification. Aortic valve regurgitation is trivial. No aortic stenosis is present. Aortic valve mean gradient measures 4.0 mmHg. Aortic valve peak gradient measures 7.2 mmHg. Aortic valve area, by VTI measures 3.00 cm. Pulmonic Valve: The pulmonic valve was grossly normal. Pulmonic valve regurgitation is trivial. Aorta: The aortic root is normal in size and structure. Venous: The inferior vena cava is normal in size with greater than 50% respiratory variability, suggesting right atrial pressure of 3 mmHg. IAS/Shunts: No atrial level shunt detected by color flow Doppler.  LEFT VENTRICLE PLAX 2D LVIDd:         5.40 cm   Diastology  LVIDs:         3.30 cm   LV e' medial:    7.83 cm/s LV PW:         0.90 cm   LV E/e' medial:  19.5 LV IVS:        1.10 cm   LV e' lateral:   10.00 cm/s LVOT diam:     2.00 cm   LV E/e' lateral: 15.3 LV SV:         84 LV SV Index:   34 LVOT Area:     3.14 cm  RIGHT VENTRICLE RV Basal diam:  3.70 cm RV Mid diam:    3.40 cm RV S prime:     11.00 cm/s TAPSE (M-mode): 2.9 cm LEFT ATRIUM             Index        RIGHT ATRIUM           Index LA diam:        4.50 cm 1.85 cm/m   RA Area:     20.80 cm LA Vol (A2C):   87.9 ml 36.15 ml/m  RA Volume:   58.10 ml  23.89 ml/m LA Vol (A4C):   80.9 ml 33.27 ml/m LA Biplane Vol: 88.1 ml 36.23 ml/m  AORTIC VALVE                    PULMONIC VALVE AV Area (Vmax):    2.77 cm     PV Vmax:       1.03 m/s AV Area (Vmean):   2.69 cm     PV Peak grad:  4.2 mmHg AV Area (VTI):     3.00 cm AV Vmax:           134.00 cm/s AV Vmean:          96.200 cm/s AV VTI:            0.279 m AV Peak Grad:      7.2 mmHg AV Mean  Grad:      4.0 mmHg LVOT Vmax:         118.00 cm/s LVOT Vmean:        82.500 cm/s LVOT VTI:          0.266 m LVOT/AV VTI ratio: 0.95  AORTA Ao Root diam: 3.50 cm MITRAL VALVE MV Area (PHT): 3.68 cm     SHUNTS MV Area VTI:   2.35 cm     Systemic VTI:  0.27 m MV Peak grad:  8.5 mmHg     Systemic Diam: 2.00 cm MV Mean grad:  3.0 mmHg MV Vmax:       1.46 m/s MV Vmean:      78.5 cm/s MV Decel Time: 206 msec MV E velocity: 153.00 cm/s MV A velocity: 80.00 cm/s MV E/A ratio:  1.91 Rozann Lesches MD Electronically signed by Rozann Lesches MD Signature Date/Time: 06/05/2022/1:01:07 PM    Final    DG Chest Port 1 View  Result Date: 06/04/2022 CLINICAL DATA:  Fever, possible sepsis EXAM: PORTABLE CHEST 1 VIEW COMPARISON:  02/28/2022 FINDINGS: Cardiac size is within normal limits. There are no signs of alveolar pulmonary edema. There is slight prominence of interstitial markings in both lungs. There is no focal consolidation. There is no pleural effusion or pneumothorax. Tip of  right IJ chest port is seen in superior vena cava. IMPRESSION: No focal pulmonary consolidation is seen. There is no pleural effusion. There is slight prominence of interstitial markings in both lungs. This may be an apparent change due to differences in the techniques or suggest interstitial pneumonia. Electronically Signed   By: Elmer Picker M.D.   On: 06/04/2022 19:55     ASSESSMENT:  Liver lesion/peritoneal carcinomatosis: - Patient seen at the request of Dr. Delphina Cahill - Reported pressure on the sides of the abdomen with slight pain for the last 1 month.  He also reported pain in the epigastric region. - He had lost 20 pounds in the last 3 to 4 months intentionally, cutting back on sugars.  He was also started on semaglutide. - Colonoscopy on 06/28/2011 with a benign polypoid colonic mucosa in the descending colon.  No evidence of malignancy. - MRI of the brain was negative. - EGD and colonoscopy on 09/23/2018 did not reveal any malignancies. 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  IHC positive for CK7, CDX2 and negative for GATA3.  Findings suggestive of upper GI or pancreaticobiliary primary. - Cycle 1 of gemcitabine, cisplatin and durvalumab started on 10/05/2021. - NGS: No targetable mutations.  PD-L1 (FF638) negative.  MSI-stable.  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   Social/family history: - He lives at home by himself.  He records voiceover/narrations. - Non-smoker. - Father died of MDS.  Paternal grandfather had prostate cancer.  Maternal grandfather had cancer.  3.  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 Alinda Money.  Reportedly received 1 treatment of intravesical chemo and has been on surveillance since then.  Last surveillance visit was in 2020.   PLAN:  Cholangiocarcinoma with peritoneal carcinomatosis: - CT CAP (05/23/2022): Further decrease in size of the small left hepatic lesion adjacent  to the gallbladder.  No significant residual peritoneal disease or new disease noted. - He was hospitalized from 06/04/2022 through 06/08/2022 with cellulitis.  He just finished antibiotic this morning. - Labs today shows LFTs are normal.  CBC was grossly normal.  Normocytic anemia from chemotherapy. - He is feeling weak.  I have  told him to hold chemotherapy today. - I will reevaluate him in 2 weeks.  If he is feeling better we will start treatment at that time.   Hypertension: - Continue Benicar and amlodipine.  Blood pressure is stable.  3.  Neuropathy/"drawing" of legs: - Continue Mirapex 0.75 mg 3 times daily.  Numbness in the bottom of the feet is stable.  4.  Sleeping difficulty: - Continue Lunesta as needed.  5.  Hypomagnesemia: - Continue magnesium 3 times daily.   Orders placed this encounter:  No orders of the defined types were placed in this encounter.    Derek Jack, MD [employer] [phone]

## 2022-06-27 ENCOUNTER — Ambulatory Visit: Payer: Medicare Other

## 2022-06-27 ENCOUNTER — Other Ambulatory Visit: Payer: Medicare Other

## 2022-06-27 ENCOUNTER — Ambulatory Visit: Payer: Medicare Other | Admitting: Hematology

## 2022-06-28 ENCOUNTER — Inpatient Hospital Stay (HOSPITAL_BASED_OUTPATIENT_CLINIC_OR_DEPARTMENT_OTHER): Payer: Medicare Other | Admitting: Hematology

## 2022-06-28 ENCOUNTER — Inpatient Hospital Stay: Payer: Medicare Other

## 2022-06-28 DIAGNOSIS — C787 Secondary malignant neoplasm of liver and intrahepatic bile duct: Secondary | ICD-10-CM | POA: Diagnosis not present

## 2022-06-28 DIAGNOSIS — Z95828 Presence of other vascular implants and grafts: Secondary | ICD-10-CM

## 2022-06-28 DIAGNOSIS — Z79899 Other long term (current) drug therapy: Secondary | ICD-10-CM

## 2022-06-28 DIAGNOSIS — C221 Intrahepatic bile duct carcinoma: Secondary | ICD-10-CM

## 2022-06-28 DIAGNOSIS — Z08 Encounter for follow-up examination after completed treatment for malignant neoplasm: Secondary | ICD-10-CM

## 2022-06-28 LAB — CBC WITH DIFFERENTIAL/PLATELET
Abs Immature Granulocytes: 0.03 10*3/uL (ref 0.00–0.07)
Basophils Absolute: 0 10*3/uL (ref 0.0–0.1)
Basophils Relative: 1 %
Eosinophils Absolute: 0.2 10*3/uL (ref 0.0–0.5)
Eosinophils Relative: 4 %
HCT: 34.7 % — ABNORMAL LOW (ref 39.0–52.0)
Hemoglobin: 11.1 g/dL — ABNORMAL LOW (ref 13.0–17.0)
Immature Granulocytes: 1 %
Lymphocytes Relative: 27 %
Lymphs Abs: 1.4 10*3/uL (ref 0.7–4.0)
MCH: 32 pg (ref 26.0–34.0)
MCHC: 32 g/dL (ref 30.0–36.0)
MCV: 100 fL (ref 80.0–100.0)
Monocytes Absolute: 0.7 10*3/uL (ref 0.1–1.0)
Monocytes Relative: 13 %
Neutro Abs: 2.9 10*3/uL (ref 1.7–7.7)
Neutrophils Relative %: 54 %
Platelets: 235 10*3/uL (ref 150–400)
RBC: 3.47 MIL/uL — ABNORMAL LOW (ref 4.22–5.81)
RDW: 13.3 % (ref 11.5–15.5)
WBC: 5.2 10*3/uL (ref 4.0–10.5)
nRBC: 0 % (ref 0.0–0.2)

## 2022-06-28 LAB — TSH: TSH: 3.153 u[IU]/mL (ref 0.350–4.500)

## 2022-06-28 LAB — COMPREHENSIVE METABOLIC PANEL WITH GFR
ALT: 16 U/L (ref 0–44)
AST: 19 U/L (ref 15–41)
Albumin: 3.6 g/dL (ref 3.5–5.0)
Alkaline Phosphatase: 68 U/L (ref 38–126)
Anion gap: 9 (ref 5–15)
BUN: 39 mg/dL — ABNORMAL HIGH (ref 8–23)
CO2: 26 mmol/L (ref 22–32)
Calcium: 9 mg/dL (ref 8.9–10.3)
Chloride: 101 mmol/L (ref 98–111)
Creatinine, Ser: 1.38 mg/dL — ABNORMAL HIGH (ref 0.61–1.24)
GFR, Estimated: 56 mL/min — ABNORMAL LOW
Glucose, Bld: 138 mg/dL — ABNORMAL HIGH (ref 70–99)
Potassium: 3.9 mmol/L (ref 3.5–5.1)
Sodium: 136 mmol/L (ref 135–145)
Total Bilirubin: 0.4 mg/dL (ref 0.3–1.2)
Total Protein: 6.5 g/dL (ref 6.5–8.1)

## 2022-06-28 LAB — MAGNESIUM: Magnesium: 1.9 mg/dL (ref 1.7–2.4)

## 2022-06-28 MED ORDER — BUMETANIDE 1 MG PO TABS: 1.0000 mg | ORAL_TABLET | Freq: Every morning | ORAL | 2 refills | Status: DC

## 2022-06-28 MED ORDER — HEPARIN SOD (PORK) LOCK FLUSH 100 UNIT/ML IV SOLN
500.0000 [IU] | Freq: Once | INTRAVENOUS | Status: AC
  Administered 2022-06-28: 500 [IU] via INTRAVENOUS

## 2022-06-28 MED ORDER — SODIUM CHLORIDE 0.9% FLUSH
10.0000 mL | Freq: Once | INTRAVENOUS | Status: AC
  Administered 2022-06-28: 10 mL via INTRAVENOUS

## 2022-06-28 NOTE — Progress Notes (Signed)
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  PRIOR THERAPY: none  NGS Results: No targetable mutations.  PD-L1 (YD741) negative.  MSI-stable.  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, a 66 y.o. male, seen for cholangiocarcinoma and toxicity assessment prior to next cycle of therapy.  He is taking Lasix 40 mg in the mornings daily and does not report any increase in urination.  He still has swelling in the legs.  Numbness in the legs and feet has been stable.  Denies any GI symptoms.  REVIEW OF SYSTEMS:  Review of Systems  Constitutional:  Negative for appetite change, fatigue and unexpected weight change.  HENT:   Negative for mouth sores (healed) and tinnitus.   Neurological:  Positive for numbness (Bottom of the feet stable).  Psychiatric/Behavioral:  Negative for sleep disturbance.   All other systems reviewed and are negative.   PAST MEDICAL/SURGICAL HISTORY:  Past Medical History:  Diagnosis Date   Anxiety    Bladder cancer ([place]) 09/11/2009   CAD  (coronary artery disease), native coronary artery    a. Mildly elevated troponin 03/2013, cath with nonobstructive disease including 50% AV groove distal stenosis before large OM   Essential hypertension    Headache(784.0)    History of migraines   Hyperglycemia    Mixed hyperlipidemia    Neuropathy    Obesity    Port-A-Cath in place 09/30/2021   Pre-diabetes    Seasonal allergies    Sleep apnea    On CPAP   Past Surgical History:  Procedure Laterality Date   BIOPSY  09/23/2021   Procedure: BIOPSY;  Surgeon: Harvel Quale, MD;  [facility];  Service: Gastroenterology;;   COLONOSCOPY  06/28/2011   Procedure: COLONOSCOPY;  Surgeon: Rogene Houston, MD;  [facility];  Service: Endoscopy;  Laterality: N/A;   COLONOSCOPY WITH PROPOFOL N/A 09/23/2021   Procedure: COLONOSCOPY WITH PROPOFOL;  Surgeon: Harvel Quale, MD;  [facility];  Service: Gastroenterology;  Laterality: N/A;  940   ESOPHAGOGASTRODUODENOSCOPY (EGD) WITH PROPOFOL N/A 09/23/2021   Procedure: ESOPHAGOGASTRODUODENOSCOPY (EGD) WITH PROPOFOL;  Surgeon: Harvel Quale, MD;  [facility];  Service: Gastroenterology;  Laterality: N/A;   IR IMAGING GUIDED PORT INSERTION  09/28/2021   IR PARACENTESIS  09/28/2021   JOINT REPLACEMENT Right    hip   LEFT HEART CATHETERIZATION WITH CORONARY ANGIOGRAM N/A 03/31/2013   Procedure: LEFT HEART CATHETERIZATION WITH CORONARY ANGIOGRAM;  Surgeon: Minus Breeding, MD;  Location: [facility] CATH LAB;  Service: Cardiovascular;  Laterality: N/A;   POLYPECTOMY  09/23/2021   Procedure: POLYPECTOMY;  Surgeon: Montez Morita, Quillian Quince, MD;  [facility];  Service: Gastroenterology;;   TURBT  09/11/2009    SOCIAL HISTORY:  Social History   Socioeconomic History   Marital status: Divorced    Spouse name: Not on file   Number of children: 0   Years of education: college   Highest education level: Not on file   Occupational History    Employer: SELF-EMPLOYED  Tobacco Use   Smoking status: Former    Packs/day: 0.50    Years: 10.00    Total pack years: 5.00    Types: Cigarettes    Quit date: 07/10/2011    Years since quitting: 10.9   Smokeless tobacco: Never   Tobacco comments:    Quit several yrs prior to 03/2013.  Vaping Use   Vaping Use: Never used  Substance and Sexual Activity   Alcohol use: Yes    Alcohol/week: 0.0 standard drinks of alcohol    Comment: Occasional   Drug use: No   Sexual activity: Not on file  Other Topics Concern   Not on file  Social History Narrative   Not on file   Social Determinants of Health   Financial Resource Strain: Not on file  Food Insecurity: No Food Insecurity (06/05/2022)   Hunger Vital Sign    Worried About Running Out of Food in the Last Year: Never true    Ran Out of Food in the Last Year: Never true  Transportation Needs: No Transportation Needs (06/05/2022)   PRAPARE - Hydrologist (Medical): No    Lack of Transportation (Non-Medical): No  Physical Activity: Not on file  Stress: Not on file  Social Connections: Not on file  Intimate Partner Violence: Not At Risk (06/05/2022)   Humiliation, Afraid, Rape, and Kick questionnaire    Fear of Current or Ex-Partner: No    Emotionally Abused: No    Physically Abused: No    Sexually Abused: No    FAMILY HISTORY:  Family History  Problem Relation Age of Onset   COPD Father     CURRENT MEDICATIONS:  Current Outpatient Medications  Medication Sig Dispense Refill   amLODipine (NORVASC) 5 MG tablet Take 5 mg by mouth daily.     aspirin EC 81 MG tablet Take 81 mg by mouth daily.     buPROPion (WELLBUTRIN XL) 300 MG 24 hr tablet Take 300 mg by mouth daily.     CISPLATIN IV Inject into the vein once a week. Day 1, Day 8 q 21 days Every other week.     cyclobenzaprine (FLEXERIL) 10 MG tablet Take 10 mg by mouth at bedtime as needed for muscle spasms.      Durvalumab (IMFINZI IV) Inject into the vein every 21 ( twenty-one) days.     Elastic Bandages & Supports (ELASTIC BANDAGE 6") MISC Use compression wraps during the day for leg swelling 4 each 0   Eszopiclone 3 MG TABS Take 3 mg by mouth at bedtime. Take immediately before bedtime     famotidine (PEPCID) 20 MG tablet TAKE (1) TABLET BY MOUTH AT BEDTIME. (Patient taking differently: Take 20 mg by mouth daily.) 30 tablet 0   fenofibrate 160 MG tablet Take 160 mg by mouth daily.     FLUoxetine (PROZAC) 10 MG capsule Take 10 mg by mouth daily.     furosemide (LASIX) 40 MG tablet Take 1 tablet (40 mg total) by mouth daily. 30 tablet  1   gabapentin (NEURONTIN) 300 MG capsule Take 1 capsule (300 mg total) by mouth 3 (three) times daily. 90 capsule 3   Gauze Pads & Dressings (ABDOMINAL PAD) 8"X10" PADS Use for leaking fluid from leg swelling 360 each 0   Gemcitabine HCl (GEMZAR IV) Inject into the vein once a week. Day 1, Day 8 q 21 days Every other week     magnesium oxide (MAG-OX) 400 (240 Mg) MG tablet Take 1 tablet (400 mg total) by mouth 2 (two) times daily. (Patient taking differently: Take 400 mg by mouth 3 (three) times daily.) 90 tablet 6   metFORMIN (GLUCOPHAGE-XR) 500 MG 24 hr tablet Take 500 mg by mouth 2 (two) times daily.     nitroGLYCERIN (NITROSTAT) 0.4 MG SL tablet Place 1 tablet (0.4 mg total) under the tongue every 5 (five) minutes as needed for chest pain. 25 tablet 3   olmesartan (BENICAR) 40 MG tablet Take 1 tablet (40 mg total) by mouth daily. 30 tablet 6   omeprazole (PRILOSEC) 40 MG capsule Take 40 mg by mouth daily as needed (reflux).     pantoprazole (PROTONIX) 40 MG tablet Take 1 tablet (40 mg total) by mouth daily. (Patient taking differently: Take 40 mg by mouth daily as needed (reflux).) 90 tablet 3   pramipexole (MIRAPEX) 0.75 MG tablet Take 1 tablet (0.75 mg total) by mouth 3 (three) times daily. 90 tablet 0   RYBELSUS 7 MG TABS Take 14 mg by mouth daily.     tamsulosin  (FLOMAX) 0.4 MG CAPS capsule Take 0.4 mg by mouth daily.     traMADol (ULTRAM) 50 MG tablet Take 50 mg by mouth 4 (four) times daily as needed for moderate pain.     No current facility-administered medications for this visit.   Facility-Administered Medications Ordered in Other Visits  Medication Dose Route Frequency Provider Last Rate Last Admin   magnesium sulfate 2 GM/50ML IVPB             ALLERGIES:  No Known Allergies  PHYSICAL EXAM:  Performance status (ECOG): 0 - Asymptomatic  There were no vitals filed for this visit. Wt Readings from Last 3 Encounters:  06/14/22 263 lb 9.6 oz (119.6 kg)  06/08/22 265 lb 6.9 oz (120.4 kg)  05/30/22 264 lb 14.4 oz (120.2 kg)   Physical Exam Vitals reviewed.  Constitutional:      Appearance: Normal appearance.  Cardiovascular:     Rate and Rhythm: Normal rate and regular rhythm.     Pulses: Normal pulses.     Heart sounds: Normal heart sounds.  Pulmonary:     Effort: Pulmonary effort is normal.     Breath sounds: Normal breath sounds.  Neurological:     General: No focal deficit present.     Mental Status: He is alert and oriented to person, place, and time.  Psychiatric:        Mood and Affect: Mood normal.        Behavior: Behavior normal.    LABORATORY DATA:  I have reviewed the labs as listed.     Latest Ref Rng & Units 06/14/2022    7:56 AM 06/07/2022    8:39 AM 06/06/2022    4:36 AM  CBC  WBC 4.0 - 10.5 K/uL 4.8  5.3  3.9   Hemoglobin 13.0 - 17.0 g/dL 10.5  9.6  8.8   Hematocrit 39.0 - 52.0 % 32.8  28.6  26.9   Platelets 150 - 400 K/uL 163  129  140       Latest Ref Rng & Units 06/14/2022    7:56 AM 06/08/2022    4:42 AM 06/07/2022    8:39 AM  CMP  Glucose 70 - 99 mg/dL 128  108  108   BUN 8 - 23 mg/dL 42  21  19   Creatinine 0.61 - 1.24 mg/dL 1.34  1.17  0.99   Sodium 135 - 145 mmol/L 136  135  137   Potassium 3.5 - 5.1 mmol/L 4.4  4.1  3.9   Chloride 98 - 111 mmol/L 102  103  107   CO2 22 - 32 mmol/L '26  26   26   ' Calcium 8.9 - 10.3 mg/dL 9.1  8.6  8.7   Total Protein 6.5 - 8.1 g/dL 6.5     Total Bilirubin 0.3 - 1.2 mg/dL 0.4     Alkaline Phos 38 - 126 U/L 71     AST 15 - 41 U/L 16     ALT 0 - 44 U/L 18       DIAGNOSTIC IMAGING:  I have independently reviewed the scans and discussed with the patient. ECHOCARDIOGRAM COMPLETE  Result Date: 06/05/2022    ECHOCARDIOGRAM REPORT   Patient Name:   Marvin Carr Date of Exam: 06/05/2022 Medical Rec #:  [id number]       Height:       72.0 in Accession #:    [id number]      Weight:       272.9 lb Date of Birth:  [birth year]       BSA:          2.432 m Patient Age:    30 years        BP:           152/75 mmHg Patient Gender: M               HR:           77 bpm. Exam Location:  [facility] Procedure: 2D Echo, Cardiac Doppler and Color Doppler Indications:    CHF  History:        Patient has no prior history of Echocardiogram examinations.                 CAD; Risk Factors:Hypertension and Dyslipidemia.  Sonographer:    Wenda Low Referring Phys: [id number] [place]  1. Left ventricular ejection fraction, by estimation, is 60 to 65%. The left ventricle has normal function. The left ventricle has no regional wall motion abnormalities. There is mild asymmetric left ventricular hypertrophy of the septal segment. Left ventricular diastolic parameters are indeterminate. Elevated left ventricular end-diastolic pressure.  2. Right ventricular systolic function is normal. The right ventricular size is normal. Tricuspid regurgitation signal is inadequate for assessing PA pressure.  3. Left atrial size was mildly dilated.  4. The mitral valve is grossly normal. Trivial mitral valve regurgitation.  5. The aortic valve is tricuspid. Aortic valve regurgitation is trivial. No aortic stenosis is present. Aortic valve mean gradient measures 4.0 mmHg.  6. The inferior vena cava is normal in size with greater than 50% respiratory variability, suggesting right atrial  pressure of 3 mmHg. Comparison(s): No prior Echocardiogram. FINDINGS  Left Ventricle: Left ventricular ejection fraction, by estimation, is 60 to 65%. The left ventricle has normal function. The left ventricle has no regional wall motion abnormalities. The left ventricular internal cavity size was normal in size. There is  mild asymmetric left ventricular hypertrophy of the septal segment. Left ventricular diastolic parameters are indeterminate. Elevated left ventricular end-diastolic pressure. Right Ventricle: The right ventricular size is normal. No increase in right ventricular wall thickness. Right ventricular systolic function is normal. Tricuspid regurgitation signal is inadequate for assessing PA pressure. Left Atrium: Left atrial size was mildly dilated. Right Atrium: Right atrial size was normal in size. Pericardium: There is no evidence of pericardial effusion. Mitral Valve: The mitral valve is grossly normal. Trivial mitral valve regurgitation. MV peak gradient, 8.5 mmHg. The mean mitral valve gradient is 3.0 mmHg. Tricuspid Valve: The tricuspid valve is grossly normal. Tricuspid valve regurgitation is mild. Aortic Valve: The aortic valve is tricuspid. There is mild aortic valve annular calcification. Aortic valve regurgitation is trivial. No aortic stenosis is present. Aortic valve mean gradient measures 4.0 mmHg. Aortic valve peak gradient measures 7.2 mmHg. Aortic valve area, by VTI measures 3.00 cm. Pulmonic Valve: The pulmonic valve was grossly normal. Pulmonic valve regurgitation is trivial. Aorta: The aortic root is normal in size and structure. Venous: The inferior vena cava is normal in size with greater than 50% respiratory variability, suggesting right atrial pressure of 3 mmHg. IAS/Shunts: No atrial level shunt detected by color flow Doppler.  LEFT VENTRICLE PLAX 2D LVIDd:         5.40 cm   Diastology LVIDs:         3.30 cm   LV e' medial:    7.83 cm/s LV PW:         0.90 cm   LV E/e' medial:   19.5 LV IVS:        1.10 cm   LV e' lateral:   10.00 cm/s LVOT diam:     2.00 cm   LV E/e' lateral: 15.3 LV SV:         84 LV SV Index:   34 LVOT Area:     3.14 cm  RIGHT VENTRICLE RV Basal diam:  3.70 cm RV Mid diam:    3.40 cm RV S prime:     11.00 cm/s TAPSE (M-mode): 2.9 cm LEFT ATRIUM             Index        RIGHT ATRIUM           Index LA diam:        4.50 cm 1.85 cm/m   RA Area:     20.80 cm LA Vol (A2C):   87.9 ml 36.15 ml/m  RA Volume:   58.10 ml  23.89 ml/m LA Vol (A4C):   80.9 ml 33.27 ml/m LA Biplane Vol: 88.1 ml 36.23 ml/m  AORTIC VALVE                    PULMONIC VALVE AV Area (Vmax):    2.77 cm     PV Vmax:       1.03 m/s AV Area (Vmean):   2.69 cm     PV Peak grad:  4.2 mmHg AV Area (VTI):     3.00 cm AV Vmax:           134.00 cm/s AV Vmean:          96.200 cm/s AV VTI:            0.279 m AV Peak Grad:      7.2 mmHg AV Mean Grad:      4.0 mmHg LVOT Vmax:  118.00 cm/s LVOT Vmean:        82.500 cm/s LVOT VTI:          0.266 m LVOT/AV VTI ratio: 0.95  AORTA Ao Root diam: 3.50 cm MITRAL VALVE MV Area (PHT): 3.68 cm     SHUNTS MV Area VTI:   2.35 cm     Systemic VTI:  0.27 m MV Peak grad:  8.5 mmHg     Systemic Diam: 2.00 cm MV Mean grad:  3.0 mmHg MV Vmax:       1.46 m/s MV Vmean:      78.5 cm/s MV Decel Time: 206 msec MV E velocity: 153.00 cm/s MV A velocity: 80.00 cm/s MV E/A ratio:  1.91 Rozann Lesches MD Electronically signed by Rozann Lesches MD Signature Date/Time: 06/05/2022/1:01:07 PM    Final    DG Chest Port 1 View  Result Date: 06/04/2022 CLINICAL DATA:  Fever, possible sepsis EXAM: PORTABLE CHEST 1 VIEW COMPARISON:  02/28/2022 FINDINGS: Cardiac size is within normal limits. There are no signs of alveolar pulmonary edema. There is slight prominence of interstitial markings in both lungs. There is no focal consolidation. There is no pleural effusion or pneumothorax. Tip of right IJ chest port is seen in superior vena cava. IMPRESSION: No focal pulmonary consolidation  is seen. There is no pleural effusion. There is slight prominence of interstitial markings in both lungs. This may be an apparent change due to differences in the techniques or suggest interstitial pneumonia. Electronically Signed   By: Elmer Picker M.D.   On: 06/04/2022 19:55     ASSESSMENT:  Liver lesion/peritoneal carcinomatosis: - Patient seen at the request of Dr. Delphina Cahill - Reported pressure on the sides of the abdomen with slight pain for the last 1 month.  He also reported pain in the epigastric region. - He had lost 20 pounds in the last 3 to 4 months intentionally, cutting back on sugars.  He was also started on semaglutide. - Colonoscopy on 06/28/2011 with a benign polypoid colonic mucosa in the descending colon.  No evidence of malignancy. - MRI of the brain was negative. - EGD and colonoscopy on 09/23/2018 did not reveal any malignancies. 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  IHC positive for CK7, CDX2 and negative for GATA3.  Findings suggestive of upper GI or pancreaticobiliary primary. - Cycle 1 of gemcitabine, cisplatin and durvalumab started on 10/05/2021. - NGS: No targetable mutations.  PD-L1 (JQ300) negative.  MSI-stable.  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   Social/family history: - He lives at home by himself.  He records voiceover/narrations. - Non-smoker. - Father died of MDS.  Paternal grandfather had prostate cancer.  Maternal grandfather had cancer.  3.  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 Alinda Money.  Reportedly received 1 treatment of intravesical chemo and has been on surveillance since then.  Last surveillance visit was in 2020.   PLAN:  Cholangiocarcinoma with peritoneal carcinomatosis: - CT CAP on 05/23/2022 showed further decrease in size of the small left hepatic lesion adjacent to the gallbladder.  No significant residual peritoneal disease or new disease. -  Hospitalization from 06/04/2022 through 06/08/2022 with cellulitis. - He still has leg swelling despite being on Lasix 40 mg daily. - We will discontinue Lasix.  We will start him on Bumex 1 mg daily. - I will hold his treatment today.  RTC 2 weeks to reevaluate and likely start treatment if his swelling improves.  Labs  today shows creatinine 1.38 and stable.  LFTs are normal.  CBC shows improvement in hemoglobin.   Hypertension: - Continue Benicar and amlodipine.  Blood pressure is stable.  3.  Neuropathy/"drawing" of legs: - Continue Mirapex 0.75 mg 3 times daily.  Numbness in the bottom of the feet is stable.  4.  Sleeping difficulty: - Continue Lunesta as needed.  5.  Hypomagnesemia: - Continue magnesium 3 times daily.   Orders placed this encounter:  No orders of the defined types were placed in this encounter.    Derek Jack, MD [facility] [phone]

## 2022-06-28 NOTE — Patient Instructions (Addendum)
[place] at [facility] Discharge Instructions   You were seen and examined today by Dr. Delton Coombes.  Your lab work from today is pending.   We will hold your treatment today due to the continued swelling in the legs. We sent a prescription for Bumex to your pharmacy. Take one tablet daily in the morning.   We will see you back in 2 weeks for possible treatment.    Thank you for choosing [place] at [facility] to provide your oncology and hematology care.  To afford each patient quality time with our provider, please arrive at least 15 minutes before your scheduled appointment time.   If you have a lab appointment with the [facility] please come in thru the Main Entrance and check in at the main information desk.  You need to re-schedule your appointment should you arrive 10 or more minutes late.  We strive to give you quality time with our providers, and arriving late affects you and other patients whose appointments are after yours.  Also, if you no show three or more times for appointments you may be dismissed from the clinic at the providers discretion.     Again, thank you for choosing [facility].  Our hope is that these requests will decrease the amount of time that you wait before being seen by our physicians.       _____________________________________________________________  Should you have questions after your visit to [facility], please contact our office at [phone] and follow the prompts.  Our office hours are 8:00 a.m. and 4:30 p.m. Monday - Friday.  Please note that voicemails left after 4:00 p.m. may not be returned until the following business day.  We are closed weekends and major holidays.  You do have access to a nurse 24-7, just call the main number to the clinic [phone] and do not press any options, hold on the line and a nurse will answer the phone.    For prescription refill  requests, have your pharmacy contact our office and allow 72 hours.    Due to Covid, you will need to wear a mask upon entering the hospital. If you do not have a mask, a mask will be given to you at the Main Entrance upon arrival. For doctor visits, patients may have 1 support person age 55 or older with them. For treatment visits, patients can not have anyone with them due to social distancing guidelines and our immunocompromised population.

## 2022-06-28 NOTE — Progress Notes (Signed)
Patient presents today for Imfinzi/Gemzar/Cisplatin infusions per providers order.  Vital sign within parameters for treatment.  Labs pending.  Patient has no new complaints at this time.  No treatment today per Marvin Carr, will come back in 2 weeks.

## 2022-06-28 NOTE — Progress Notes (Signed)
Patients port flushed without difficulty.  Good blood return noted with no bruising or swelling noted at site.  Stable during access and blood draw.  Patient to remain accessed for treatment. 

## 2022-06-30 LAB — CEA: CEA: 3 ng/mL (ref 0.0–4.7)

## 2022-07-05 ENCOUNTER — Ambulatory Visit: Payer: Medicare Other

## 2022-07-05 ENCOUNTER — Other Ambulatory Visit: Payer: Medicare Other

## 2022-07-12 ENCOUNTER — Inpatient Hospital Stay (HOSPITAL_BASED_OUTPATIENT_CLINIC_OR_DEPARTMENT_OTHER): Payer: Medicare Other | Admitting: Hematology

## 2022-07-12 ENCOUNTER — Inpatient Hospital Stay: Payer: Medicare Other

## 2022-07-12 ENCOUNTER — Ambulatory Visit: Payer: Medicare Other

## 2022-07-12 ENCOUNTER — Encounter: Payer: Self-pay | Admitting: Hematology

## 2022-07-12 ENCOUNTER — Inpatient Hospital Stay: Payer: Medicare Other | Attending: Hematology

## 2022-07-12 ENCOUNTER — Other Ambulatory Visit: Payer: Self-pay | Admitting: Pharmacist

## 2022-07-12 VITALS — BP 109/67 | HR 63 | Resp 20

## 2022-07-12 DIAGNOSIS — G629 Polyneuropathy, unspecified: Secondary | ICD-10-CM | POA: Diagnosis not present

## 2022-07-12 DIAGNOSIS — C221 Intrahepatic bile duct carcinoma: Secondary | ICD-10-CM | POA: Insufficient documentation

## 2022-07-12 DIAGNOSIS — Z5112 Encounter for antineoplastic immunotherapy: Secondary | ICD-10-CM | POA: Insufficient documentation

## 2022-07-12 DIAGNOSIS — Z79899 Other long term (current) drug therapy: Secondary | ICD-10-CM | POA: Insufficient documentation

## 2022-07-12 DIAGNOSIS — Z87891 Personal history of nicotine dependence: Secondary | ICD-10-CM | POA: Diagnosis not present

## 2022-07-12 DIAGNOSIS — C787 Secondary malignant neoplasm of liver and intrahepatic bile duct: Secondary | ICD-10-CM

## 2022-07-12 DIAGNOSIS — Z95828 Presence of other vascular implants and grafts: Secondary | ICD-10-CM

## 2022-07-12 DIAGNOSIS — I10 Essential (primary) hypertension: Secondary | ICD-10-CM | POA: Insufficient documentation

## 2022-07-12 DIAGNOSIS — Z8042 Family history of malignant neoplasm of prostate: Secondary | ICD-10-CM | POA: Insufficient documentation

## 2022-07-12 DIAGNOSIS — C786 Secondary malignant neoplasm of retroperitoneum and peritoneum: Secondary | ICD-10-CM | POA: Insufficient documentation

## 2022-07-12 DIAGNOSIS — Z8551 Personal history of malignant neoplasm of bladder: Secondary | ICD-10-CM | POA: Diagnosis not present

## 2022-07-12 DIAGNOSIS — Z808 Family history of malignant neoplasm of other organs or systems: Secondary | ICD-10-CM | POA: Diagnosis not present

## 2022-07-12 DIAGNOSIS — Z809 Family history of malignant neoplasm, unspecified: Secondary | ICD-10-CM | POA: Diagnosis not present

## 2022-07-12 LAB — CBC WITH DIFFERENTIAL/PLATELET
Abs Immature Granulocytes: 0.01 10*3/uL (ref 0.00–0.07)
Basophils Absolute: 0 10*3/uL (ref 0.0–0.1)
Basophils Relative: 1 %
Eosinophils Absolute: 0.3 10*3/uL (ref 0.0–0.5)
Eosinophils Relative: 5 %
HCT: 33.5 % — ABNORMAL LOW (ref 39.0–52.0)
Hemoglobin: 10.9 g/dL — ABNORMAL LOW (ref 13.0–17.0)
Immature Granulocytes: 0 %
Lymphocytes Relative: 26 %
Lymphs Abs: 1.3 10*3/uL (ref 0.7–4.0)
MCH: 31.9 pg (ref 26.0–34.0)
MCHC: 32.5 g/dL (ref 30.0–36.0)
MCV: 98 fL (ref 80.0–100.0)
Monocytes Absolute: 0.6 10*3/uL (ref 0.1–1.0)
Monocytes Relative: 11 %
Neutro Abs: 3 10*3/uL (ref 1.7–7.7)
Neutrophils Relative %: 57 %
Platelets: 200 10*3/uL (ref 150–400)
RBC: 3.42 MIL/uL — ABNORMAL LOW (ref 4.22–5.81)
RDW: 13.4 % (ref 11.5–15.5)
WBC: 5.1 10*3/uL (ref 4.0–10.5)
nRBC: 0 % (ref 0.0–0.2)

## 2022-07-12 LAB — COMPREHENSIVE METABOLIC PANEL
ALT: 16 U/L (ref 0–44)
AST: 17 U/L (ref 15–41)
Albumin: 3.5 g/dL (ref 3.5–5.0)
Alkaline Phosphatase: 76 U/L (ref 38–126)
Anion gap: 7 (ref 5–15)
BUN: 50 mg/dL — ABNORMAL HIGH (ref 8–23)
CO2: 26 mmol/L (ref 22–32)
Calcium: 9 mg/dL (ref 8.9–10.3)
Chloride: 105 mmol/L (ref 98–111)
Creatinine, Ser: 1.7 mg/dL — ABNORMAL HIGH (ref 0.61–1.24)
GFR, Estimated: 44 mL/min — ABNORMAL LOW (ref >60)
Glucose, Bld: 128 mg/dL — ABNORMAL HIGH (ref 70–99)
Potassium: 3.9 mmol/L (ref 3.5–5.1)
Sodium: 138 mmol/L (ref 135–145)
Total Bilirubin: 0.5 mg/dL (ref 0.3–1.2)
Total Protein: 6.4 g/dL — ABNORMAL LOW (ref 6.5–8.1)

## 2022-07-12 LAB — MAGNESIUM: Magnesium: 2.1 mg/dL (ref 1.7–2.4)

## 2022-07-12 MED ORDER — SODIUM CHLORIDE 0.9 % IV SOLN: INTRAVENOUS | Status: DC

## 2022-07-12 MED ORDER — SODIUM CHLORIDE 0.9 % IV SOLN: Freq: Once | INTRAVENOUS | Status: AC

## 2022-07-12 MED ORDER — HEPARIN SOD (PORK) LOCK FLUSH 100 UNIT/ML IV SOLN
500.0000 [IU] | Freq: Once | INTRAVENOUS | Status: AC | PRN
  Administered 2022-07-12: 500 [IU]

## 2022-07-12 MED ORDER — SODIUM CHLORIDE 0.9 % IV SOLN
1500.0000 mg | Freq: Once | INTRAVENOUS | Status: AC
  Administered 2022-07-12: 1500 mg via INTRAVENOUS
  Filled 2022-07-12: qty 30

## 2022-07-12 MED ORDER — SODIUM CHLORIDE 0.9% FLUSH
10.0000 mL | INTRAVENOUS | Status: DC | PRN
  Administered 2022-07-12: 10 mL

## 2022-07-12 NOTE — Progress Notes (Signed)
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  PRIOR THERAPY: none  NGS Results: No targetable mutations.  PD-L1 (DG387) negative.  MSI-stable.  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, a 66 y.o. male, seen for follow-up of cholangiocarcinoma and toxicity assessment prior to next cycle of chemotherapy.  His chemotherapy is on hold for the last 4 to 6 weeks due to lower extremity swelling.  Energy levels are 100%.  Last visit we have changed his diuretic to Bumex.  He reported slightly increased diuresis.  However he has gained weight due to improved eating.  He reports feet feel cold and numb with occasional bee sting feeling.  Denies any nausea or vomiting.  REVIEW OF SYSTEMS:  Review of Systems  Constitutional:  Negative for unexpected weight change.  Neurological:  Positive for numbness (Feet feel cold and numb with occasional bee sting).  Psychiatric/Behavioral:  Negative for sleep disturbance.   All other systems reviewed and are negative.   PAST  MEDICAL/SURGICAL HISTORY:  Past Medical History:  Diagnosis Date   Anxiety    Bladder cancer ([place]) 09/11/2009   CAD (coronary artery disease), native coronary artery    a. Mildly elevated troponin 03/2013, cath with nonobstructive disease including 50% AV groove distal stenosis before large OM   Essential hypertension    Headache(784.0)    History of migraines   Hyperglycemia    Mixed hyperlipidemia    Neuropathy    Obesity    Port-A-Cath in place 09/30/2021   Pre-diabetes    Seasonal allergies    Sleep apnea    On CPAP   Past Surgical History:  Procedure Laterality Date   BIOPSY  09/23/2021   Procedure: BIOPSY;  Surgeon: Harvel Quale, MD;  [facility];  Service: Gastroenterology;;   COLONOSCOPY  06/28/2011   Procedure: COLONOSCOPY;  Surgeon: Rogene Houston, MD;  [facility];  Service: Endoscopy;  Laterality: N/A;   COLONOSCOPY WITH PROPOFOL N/A 09/23/2021   Procedure: COLONOSCOPY WITH PROPOFOL;  Surgeon: Harvel Quale, MD;  [facility];  Service: Gastroenterology;  Laterality: N/A;  940   ESOPHAGOGASTRODUODENOSCOPY (EGD) WITH PROPOFOL N/A 09/23/2021   Procedure: ESOPHAGOGASTRODUODENOSCOPY (EGD) WITH PROPOFOL;  Surgeon: Harvel Quale, MD;  [facility];  Service: Gastroenterology;  Laterality: N/A;   IR IMAGING GUIDED PORT INSERTION  09/28/2021   IR PARACENTESIS  09/28/2021   JOINT REPLACEMENT Right    hip   LEFT HEART CATHETERIZATION WITH CORONARY ANGIOGRAM N/A 03/31/2013   Procedure: LEFT HEART CATHETERIZATION WITH CORONARY ANGIOGRAM;  Surgeon: Jeneen Rinks  Hochrein, MD;  Location: [facility] CATH LAB;  Service: Cardiovascular;  Laterality: N/A;   POLYPECTOMY  09/23/2021   Procedure: POLYPECTOMY;  Surgeon: Harvel Quale, MD;  [facility];  Service: Gastroenterology;;   TURBT  09/11/2009    SOCIAL HISTORY:  Social History   Socioeconomic History   Marital status: Divorced    Spouse  name: Not on file   Number of children: 0   Years of education: college   Highest education level: Not on file  Occupational History    Employer: SELF-EMPLOYED  Tobacco Use   Smoking status: Former    Packs/day: 0.50    Years: 10.00    Total pack years: 5.00    Types: Cigarettes    Quit date: 07/10/2011    Years since quitting: 11.0   Smokeless tobacco: Never   Tobacco comments:    Quit several yrs prior to 03/2013.  Vaping Use   Vaping Use: Never used  Substance and Sexual Activity   Alcohol use: Yes    Alcohol/week: 0.0 standard drinks of alcohol    Comment: Occasional   Drug use: No   Sexual activity: Not on file  Other Topics Concern   Not on file  Social History Narrative   Not on file   Social Determinants of Health   Financial Resource Strain: Not on file  Food Insecurity: No Food Insecurity (06/05/2022)   Hunger Vital Sign    Worried About Running Out of Food in the Last Year: Never true    Ran Out of Food in the Last Year: Never true  Transportation Needs: No Transportation Needs (06/05/2022)   PRAPARE - Hydrologist (Medical): No    Lack of Transportation (Non-Medical): No  Physical Activity: Not on file  Stress: Not on file  Social Connections: Not on file  Intimate Partner Violence: Not At Risk (06/05/2022)   Humiliation, Afraid, Rape, and Kick questionnaire    Fear of Current or Ex-Partner: No    Emotionally Abused: No    Physically Abused: No    Sexually Abused: No    FAMILY HISTORY:  Family History  Problem Relation Age of Onset   COPD Father     CURRENT MEDICATIONS:  Current Outpatient Medications  Medication Sig Dispense Refill   amLODipine (NORVASC) 5 MG tablet Take 5 mg by mouth daily.     aspirin EC 81 MG tablet Take 81 mg by mouth daily.     bumetanide (BUMEX) 1 MG tablet Take 1 tablet (1 mg total) by mouth in the morning. 30 tablet 2   buPROPion (WELLBUTRIN XL) 300 MG 24 hr tablet Take 300 mg by mouth daily.      CISPLATIN IV Inject into the vein once a week. Day 1, Day 8 q 21 days Every other week.     cyclobenzaprine (FLEXERIL) 10 MG tablet Take 10 mg by mouth at bedtime as needed for muscle spasms.     Durvalumab (IMFINZI IV) Inject into the vein every 21 ( twenty-one) days.     Elastic Bandages & Supports (ELASTIC BANDAGE 6") MISC Use compression wraps during the day for leg swelling 4 each 0   Eszopiclone 3 MG TABS Take 3 mg by mouth at bedtime. Take immediately before bedtime     famotidine (PEPCID) 20 MG tablet TAKE (1) TABLET BY MOUTH AT BEDTIME. (Patient taking differently: Take 20 mg by mouth daily.) 30 tablet 0   fenofibrate 160 MG tablet Take 160 mg  by mouth daily.     FLUoxetine (PROZAC) 10 MG capsule Take 10 mg by mouth daily.     furosemide (LASIX) 40 MG tablet Take 1 tablet (40 mg total) by mouth daily. 30 tablet 1   gabapentin (NEURONTIN) 300 MG capsule Take 1 capsule (300 mg total) by mouth 3 (three) times daily. 90 capsule 3   Gauze Pads & Dressings (ABDOMINAL PAD) 8"X10" PADS Use for leaking fluid from leg swelling 360 each 0   Gemcitabine HCl (GEMZAR IV) Inject into the vein once a week. Day 1, Day 8 q 21 days Every other week     magnesium oxide (MAG-OX) 400 (240 Mg) MG tablet Take 1 tablet (400 mg total) by mouth 2 (two) times daily. (Patient taking differently: Take 400 mg by mouth 3 (three) times daily.) 90 tablet 6   metFORMIN (GLUCOPHAGE-XR) 500 MG 24 hr tablet Take 500 mg by mouth 2 (two) times daily.     nitroGLYCERIN (NITROSTAT) 0.4 MG SL tablet Place 1 tablet (0.4 mg total) under the tongue every 5 (five) minutes as needed for chest pain. 25 tablet 3   olmesartan (BENICAR) 40 MG tablet Take 1 tablet (40 mg total) by mouth daily. 30 tablet 6   omeprazole (PRILOSEC) 40 MG capsule Take 40 mg by mouth daily as needed (reflux).     pantoprazole (PROTONIX) 40 MG tablet Take 1 tablet (40 mg total) by mouth daily. (Patient taking differently: Take 40 mg by mouth daily as needed  (reflux).) 90 tablet 3   pramipexole (MIRAPEX) 0.75 MG tablet Take 1 tablet (0.75 mg total) by mouth 3 (three) times daily. 90 tablet 0   RYBELSUS 7 MG TABS Take 14 mg by mouth daily.     tamsulosin (FLOMAX) 0.4 MG CAPS capsule Take 0.4 mg by mouth daily.     traMADol (ULTRAM) 50 MG tablet Take 50 mg by mouth 4 (four) times daily as needed for moderate pain.     No current facility-administered medications for this visit.   Facility-Administered Medications Ordered in Other Visits  Medication Dose Route Frequency Provider Last Rate Last Admin   magnesium sulfate 2 GM/50ML IVPB             ALLERGIES:  No Known Allergies  PHYSICAL EXAM:  Performance status (ECOG): 0 - Asymptomatic  There were no vitals filed for this visit. Wt Readings from Last 3 Encounters:  07/12/22 269 lb 11.2 oz (122.3 kg)  06/14/22 263 lb 9.6 oz (119.6 kg)  06/08/22 265 lb 6.9 oz (120.4 kg)   Physical Exam Vitals reviewed.  Constitutional:      Appearance: Normal appearance.  Cardiovascular:     Rate and Rhythm: Normal rate and regular rhythm.     Pulses: Normal pulses.     Heart sounds: Normal heart sounds.  Pulmonary:     Effort: Pulmonary effort is normal.     Breath sounds: Normal breath sounds.  Neurological:     General: No focal deficit present.     Mental Status: He is alert and oriented to person, place, and time.  Psychiatric:        Mood and Affect: Mood normal.        Behavior: Behavior normal.     LABORATORY DATA:  I have reviewed the labs as listed.     Latest Ref Rng & Units 06/28/2022    8:03 AM 06/14/2022    7:56 AM 06/07/2022    8:39 AM  CBC  WBC 4.0 -  10.5 K/uL 5.2  4.8  5.3   Hemoglobin 13.0 - 17.0 g/dL 11.1  10.5  9.6   Hematocrit 39.0 - 52.0 % 34.7  32.8  28.6   Platelets 150 - 400 K/uL 235  163  129       Latest Ref Rng & Units 06/28/2022    7:56 AM 06/14/2022    7:56 AM 06/08/2022    4:42 AM  CMP  Glucose 70 - 99 mg/dL 138  128  108   BUN 8 - 23 mg/dL 39  42  21    Creatinine 0.61 - 1.24 mg/dL 1.38  1.34  1.17   Sodium 135 - 145 mmol/L 136  136  135   Potassium 3.5 - 5.1 mmol/L 3.9  4.4  4.1   Chloride 98 - 111 mmol/L 101  102  103   CO2 22 - 32 mmol/L _0 Calcium 8.9 - 10.3 mg/dL 9.0  9.1  8.6   Total Protein 6.5 - 8.1 g/dL 6.5  6.5    Total Bilirubin 0.3 - 1.2 mg/dL 0.4  0.4    Alkaline Phos 38 - 126 U/L 68  71    AST 15 - 41 U/L 19  16    ALT 0 - 44 U/L 16  18      DIAGNOSTIC IMAGING:  I have independently reviewed the scans and discussed with the patient. No results found.   ASSESSMENT:  Liver lesion/peritoneal carcinomatosis: - Patient seen at the request of Dr. Delphina Cahill - Reported pressure on the sides of the abdomen with slight pain for the last 1 month.  He also reported pain in the epigastric region. - He had lost 20 pounds in the last 3 to 4 months intentionally, cutting back on sugars.  He was also started on semaglutide. - Colonoscopy on 06/28/2011 with a benign polypoid colonic mucosa in the descending colon.  No evidence of malignancy. - MRI of the brain was negative. - EGD and colonoscopy on 09/23/2018 did not reveal any malignancies. 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  IHC positive for CK7, CDX2 and negative for GATA3.  Findings suggestive of upper GI or pancreaticobiliary primary. - Cycle 1 of gemcitabine, cisplatin and durvalumab started on 10/05/2021. - NGS: No targetable mutations.  PD-L1 (WG665) negative.  MSI-stable.  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   Social/family history: - He lives at home by himself.  He records voiceover/narrations. - Non-smoker. - Father died of MDS.  Paternal grandfather had prostate cancer.  Maternal grandfather had cancer.  3.  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 Alinda Money.  Reportedly received 1 treatment of intravesical chemo and has been on surveillance since then.  Last surveillance visit  was in 2020.   PLAN:  Cholangiocarcinoma with peritoneal carcinomatosis: -CT CAP on 05/23/2022 showed further decrease in size of small hepatic lesion adjacent to the gallbladder with no significant residual peritoneal or new disease. - Hospitalization from 06/04/2022 through 05/31/2022 with cellulitis. - He has developed leg swellings, and we held his treatment. - He is currently taking Bumex 1 mg daily. - Labs today shows creatinine 1.7 with BUN 50.  CBC was grossly normal. - Recommend holding chemotherapy at this time.  Will decrease Benicar to half tablet daily.  Will decrease Bumex to every other day.  He will receive Imfinzi only today. - He will receive final mL of normal saline IV today. - RTC  4 weeks for follow-up.  Plan to repeat CT CAP prior to next visit.   Hypertension: - Creatinine is up to 1.7.  Continue amlodipine.  Decrease Benicar to half tablet at (20 mg) daily.  3.  Neuropathy/"drawing" of legs: - Continue Mirapex 0.75 mg 3 times daily.  4.  Sleeping difficulty: - Continue Lunesta as needed.  5.  Hypomagnesemia: - Continue magnesium 3 times daily.   Orders placed this encounter:  No orders of the defined types were placed in this encounter.    Derek Jack, MD [facility] [phone]

## 2022-07-12 NOTE — Progress Notes (Signed)
Labs reviewed with MD today. Will only get imfinzi today with 50 ml of Normal saline per MD.   Treatment given per orders. Patient tolerated it well without problems. Vitals stable and discharged home from clinic ambulatory. Follow up as scheduled.

## 2022-07-12 NOTE — Patient Instructions (Signed)
[facility]  Discharge Instructions: Thank you for choosing [facility] to provide your oncology and hematology care.  If you have a lab appointment with the Cancer Center, please come in thru the Main Entrance and check in at the main information desk.  Wear comfortable clothing and clothing appropriate for easy access to any Portacath or PICC line.   We strive to give you quality time with your provider. You may need to reschedule your appointment if you arrive late (15 or more minutes).  Arriving late affects you and other patients whose appointments are after yours.  Also, if you miss three or more appointments without notifying the office, you may be dismissed from the clinic at the provider's discretion.      For prescription refill requests, have your pharmacy contact our office and allow 72 hours for refills to be completed.    Today you received the following chemotherapy and/or immunotherapy agents Imfinzi      To help prevent nausea and vomiting after your treatment, we encourage you to take your nausea medication as directed.  BELOW ARE SYMPTOMS THAT SHOULD BE REPORTED IMMEDIATELY: *FEVER GREATER THAN 100.4 F (38 C) OR HIGHER *CHILLS OR SWEATING *NAUSEA AND VOMITING THAT IS NOT CONTROLLED WITH YOUR NAUSEA MEDICATION *UNUSUAL SHORTNESS OF BREATH *UNUSUAL BRUISING OR BLEEDING *URINARY PROBLEMS (pain or burning when urinating, or frequent urination) *BOWEL PROBLEMS (unusual diarrhea, constipation, pain near the anus) TENDERNESS IN MOUTH AND THROAT WITH OR WITHOUT PRESENCE OF ULCERS (sore throat, sores in mouth, or a toothache) UNUSUAL RASH, SWELLING OR PAIN  UNUSUAL VAGINAL DISCHARGE OR ITCHING   Items with * indicate a potential emergency and should be followed up as soon as possible or go to the Emergency Department if any problems should occur.  Please show the CHEMOTHERAPY ALERT CARD or IMMUNOTHERAPY ALERT CARD at check-in to the Emergency  Department and triage nurse.  Should you have questions after your visit or need to cancel or reschedule your appointment, please contact [facility] [phone]  and follow the prompts.  Office hours are 8:00 a.m. to 4:30 p.m. Monday - Friday. Please note that voicemails left after 4:00 p.m. may not be returned until the following business day.  We are closed weekends and major holidays. You have access to a nurse at all times for urgent questions. Please call the main number to the clinic [phone] and follow the prompts.  For any non-urgent questions, you may also contact your provider using MyChart. We now offer e-Visits for anyone 18 and older to request care online for non-urgent symptoms. For details visit [email or web address].   Also download the MyChart app! Go to the app store, search "MyChart", open the app, select [facility], and log in with your MyChart username and password.  Masks are optional in the cancer centers. If you would like for your care team to wear a mask while they are taking care of you, please let them know. You may have one support person who is at least 67 years old accompany you for your appointments.  

## 2022-07-12 NOTE — Progress Notes (Signed)
Patient has been examined by Dr. Delton Coombes, and vital signs and labs have been reviewed. ANC, Creatinine, LFTs, hemoglobin, and platelets are within treatment parameters per M.D. - pt may proceed with treatment.  We will hold cisplatin and gemcitabine today per MD. Pt to receive Imfinzi only and NS 1 L. Primary RN and pharmacy notified.

## 2022-07-12 NOTE — Patient Instructions (Addendum)
[facility] at [facility] Discharge Instructions   You were seen and examined today by Dr. Delton Coombes.  He reviewed your lab work. Your kidney function has gone up. Decrease Bumex to 20 mg every other day. Decrease Benicar to 20 mg daily.  We will give you Imfinzi only today. We will also give you 1/2 liter of NS for IV hydration.  We will repeat a scan prior to your next visit.   Return as scheduled.    Thank you for choosing [facility] at [facility] to provide your oncology and hematology care.  To afford each patient quality time with our provider, please arrive at least 15 minutes before your scheduled appointment time.   If you have a lab appointment with the [facility] please come in thru the Main Entrance and check in at the main information desk.  You need to re-schedule your appointment should you arrive 10 or more minutes late.  We strive to give you quality time with our providers, and arriving late affects you and other patients whose appointments are after yours.  Also, if you no show three or more times for appointments you may be dismissed from the clinic at the providers discretion.     Again, thank you for choosing [facility].  Our hope is that these requests will decrease the amount of time that you wait before being seen by our physicians.       _____________________________________________________________  Should you have questions after your visit to [facility], please contact our office at [phone] and follow the prompts.  Our office hours are 8:00 a.m. and 4:30 p.m. Monday - Friday.  Please note that voicemails left after 4:00 p.m. may not be returned until the following business day.  We are closed weekends and major holidays.  You do have access to a nurse 24-7, just call the main number to the clinic [phone] and do not press any options, hold on the line and a nurse will answer  the phone.    For prescription refill requests, have your pharmacy contact our office and allow 72 hours.    Due to Covid, you will need to wear a mask upon entering the hospital. If you do not have a mask, a mask will be given to you at the Main Entrance upon arrival. For doctor visits, patients may have 1 support person age 79 or older with them. For treatment visits, patients can not have anyone with them due to social distancing guidelines and our immunocompromised population.

## 2022-07-13 ENCOUNTER — Encounter: Payer: Self-pay | Admitting: Hematology

## 2022-07-17 ENCOUNTER — Encounter: Payer: Self-pay | Admitting: Hematology

## 2022-07-18 ENCOUNTER — Encounter: Payer: Self-pay | Admitting: Hematology

## 2022-07-19 ENCOUNTER — Encounter: Payer: Self-pay | Admitting: Hematology

## 2022-07-24 ENCOUNTER — Other Ambulatory Visit: Payer: Self-pay | Admitting: Hematology

## 2022-07-26 ENCOUNTER — Ambulatory Visit: Payer: Medicare Other

## 2022-07-26 ENCOUNTER — Other Ambulatory Visit: Payer: Medicare Other

## 2022-07-27 ENCOUNTER — Encounter: Payer: Self-pay | Admitting: Hematology

## 2022-08-02 ENCOUNTER — Other Ambulatory Visit: Payer: Self-pay | Admitting: Physician Assistant

## 2022-08-02 DIAGNOSIS — K30 Functional dyspepsia: Secondary | ICD-10-CM

## 2022-08-07 ENCOUNTER — Ambulatory Visit (HOSPITAL_COMMUNITY)
Admission: RE | Admit: 2022-08-07 | Discharge: 2022-08-07 | Disposition: A | Discharge status: Home / Self Care | Payer: Medicare Other | Source: Ambulatory Visit | Attending: Hematology | Admitting: Hematology

## 2022-08-07 DIAGNOSIS — C787 Secondary malignant neoplasm of liver and intrahepatic bile duct: Secondary | ICD-10-CM | POA: Insufficient documentation

## 2022-08-07 DIAGNOSIS — C221 Intrahepatic bile duct carcinoma: Secondary | ICD-10-CM | POA: Diagnosis present

## 2022-08-07 MED ORDER — IOHEXOL 300 MG/ML  SOLN
80.0000 mL | Freq: Once | INTRAMUSCULAR | Status: AC | PRN
  Administered 2022-08-07: 80 mL via INTRAVENOUS

## 2022-08-09 ENCOUNTER — Ambulatory Visit: Payer: Medicare Other

## 2022-08-09 ENCOUNTER — Other Ambulatory Visit: Payer: Medicare Other

## 2022-08-09 ENCOUNTER — Inpatient Hospital Stay: Payer: Medicare Other

## 2022-08-09 ENCOUNTER — Inpatient Hospital Stay (HOSPITAL_BASED_OUTPATIENT_CLINIC_OR_DEPARTMENT_OTHER): Payer: Medicare Other | Admitting: Hematology

## 2022-08-09 VITALS — BP 109/63 | HR 64 | Temp 97.9°F | Resp 18

## 2022-08-09 DIAGNOSIS — C221 Intrahepatic bile duct carcinoma: Secondary | ICD-10-CM

## 2022-08-09 DIAGNOSIS — C787 Secondary malignant neoplasm of liver and intrahepatic bile duct: Secondary | ICD-10-CM

## 2022-08-09 DIAGNOSIS — Z95828 Presence of other vascular implants and grafts: Secondary | ICD-10-CM

## 2022-08-09 DIAGNOSIS — Z5112 Encounter for antineoplastic immunotherapy: Secondary | ICD-10-CM | POA: Diagnosis not present

## 2022-08-09 LAB — COMPREHENSIVE METABOLIC PANEL
ALT: 19 U/L (ref 0–44)
AST: 20 U/L (ref 15–41)
Albumin: 3.5 g/dL (ref 3.5–5.0)
Alkaline Phosphatase: 61 U/L (ref 38–126)
Anion gap: 10 (ref 5–15)
BUN: 33 mg/dL — ABNORMAL HIGH (ref 8–23)
CO2: 26 mmol/L (ref 22–32)
Calcium: 9.4 mg/dL (ref 8.9–10.3)
Chloride: 103 mmol/L (ref 98–111)
Creatinine, Ser: 1.32 mg/dL — ABNORMAL HIGH (ref 0.61–1.24)
GFR, Estimated: 59 mL/min — ABNORMAL LOW (ref >60)
Glucose, Bld: 114 mg/dL — ABNORMAL HIGH (ref 70–99)
Potassium: 4 mmol/L (ref 3.5–5.1)
Sodium: 139 mmol/L (ref 135–145)
Total Bilirubin: 0.3 mg/dL (ref 0.3–1.2)
Total Protein: 6.6 g/dL (ref 6.5–8.1)

## 2022-08-09 LAB — MAGNESIUM: Magnesium: 1.9 mg/dL (ref 1.7–2.4)

## 2022-08-09 LAB — CBC WITH DIFFERENTIAL/PLATELET
Abs Immature Granulocytes: 0.02 10*3/uL (ref 0.00–0.07)
Basophils Absolute: 0 10*3/uL (ref 0.0–0.1)
Basophils Relative: 1 %
Eosinophils Absolute: 0.2 10*3/uL (ref 0.0–0.5)
Eosinophils Relative: 4 %
HCT: 34.3 % — ABNORMAL LOW (ref 39.0–52.0)
Hemoglobin: 11.5 g/dL — ABNORMAL LOW (ref 13.0–17.0)
Immature Granulocytes: 0 %
Lymphocytes Relative: 24 %
Lymphs Abs: 1.1 10*3/uL (ref 0.7–4.0)
MCH: 31.6 pg (ref 26.0–34.0)
MCHC: 33.5 g/dL (ref 30.0–36.0)
MCV: 94.2 fL (ref 80.0–100.0)
Monocytes Absolute: 0.6 10*3/uL (ref 0.1–1.0)
Monocytes Relative: 12 %
Neutro Abs: 2.7 10*3/uL (ref 1.7–7.7)
Neutrophils Relative %: 59 %
Platelets: 215 10*3/uL (ref 150–400)
RBC: 3.64 MIL/uL — ABNORMAL LOW (ref 4.22–5.81)
RDW: 13.1 % (ref 11.5–15.5)
WBC: 4.6 10*3/uL (ref 4.0–10.5)
nRBC: 0 % (ref 0.0–0.2)

## 2022-08-09 MED ORDER — SODIUM CHLORIDE 0.9 % IV SOLN: Freq: Once | INTRAVENOUS | Status: AC

## 2022-08-09 MED ORDER — HEPARIN SOD (PORK) LOCK FLUSH 100 UNIT/ML IV SOLN
500.0000 [IU] | Freq: Once | INTRAVENOUS | Status: AC | PRN
  Administered 2022-08-09: 500 [IU]

## 2022-08-09 MED ORDER — SODIUM CHLORIDE 0.9 % IV SOLN
1500.0000 mg | Freq: Once | INTRAVENOUS | Status: AC
  Administered 2022-08-09: 1500 mg via INTRAVENOUS
  Filled 2022-08-09: qty 30

## 2022-08-09 MED ORDER — SODIUM CHLORIDE 0.9% FLUSH
10.0000 mL | INTRAVENOUS | Status: DC | PRN
  Administered 2022-08-09: 10 mL

## 2022-08-09 NOTE — Patient Instructions (Signed)
[facility] [place]  Discharge Instructions: Thank you for choosing [facility] to provide your oncology and hematology care.  If you have a lab appointment with the Cancer Center, please come in thru the Main Entrance and check in at the main information desk.  Wear comfortable clothing and clothing appropriate for easy access to any Portacath or PICC line.   We strive to give you quality time with your provider. You may need to reschedule your appointment if you arrive late (15 or more minutes).  Arriving late affects you and other patients whose appointments are after yours.  Also, if you miss three or more appointments without notifying the office, you may be dismissed from the clinic at the provider's discretion.      For prescription refill requests, have your pharmacy contact our office and allow 72 hours for refills to be completed.    Today you received the following chemotherapy and/or immunotherapy agents Imfinzi. Durvalumab Injection What is this medication? DURVALUMAB (dur VAL ue mab) treats some types of cancer. It works by helping your immune system slow or stop the spread of cancer cells. It is a monoclonal antibody. This medicine may be used for other purposes; ask your health care provider or pharmacist if you have questions. COMMON BRAND NAME(S): IMFINZI What should I tell my care team before I take this medication? They need to know if you have any of these conditions: Allogeneic stem cell transplant (uses someone else's stem cells) Autoimmune diseases, such as Crohn disease, ulcerative colitis, lupus History of chest radiation Nervous system problems, such as Guillain-Barre syndrome, myasthenia gravis Organ transplant An unusual or allergic reaction to durvalumab, other medications, foods, dyes, or preservatives Pregnant or trying to get pregnant Breast-feeding How should I use this medication? This medication is infused into a vein. It is  given by your care team in a hospital or clinic setting. A special MedGuide will be given to you before each treatment. Be sure to read this information carefully each time. Talk to your care team about the use of this medication in children. Special care may be needed. Overdosage: If you think you have taken too much of this medicine contact a poison control center or emergency room at once. NOTE: This medicine is only for you. Do not share this medicine with others. What if I miss a dose? Keep appointments for follow-up doses. It is important not to miss your dose. Call your care team if you are unable to keep an appointment. What may interact with this medication? Interactions have not been studied. This list may not describe all possible interactions. Give your health care provider a list of all the medicines, herbs, non-prescription drugs, or dietary supplements you use. Also tell them if you smoke, drink alcohol, or use illegal drugs. Some items may interact with your medicine. What should I watch for while using this medication? Your condition will be monitored carefully while you are receiving this medication. You may need blood work while taking this medication. This medication may cause serious skin reactions. They can happen weeks to months after starting the medication. Contact your care team right away if you notice fevers or flu-like symptoms with a rash. The rash may be red or purple and then turn into blisters or peeling of the skin. You may also notice a red rash with swelling of the face, lips, or lymph nodes in your neck or under your arms. Tell your care team right away if   you have any change in your eyesight. Talk to your care team if you may be pregnant. Serious birth defects can occur if you take this medication during pregnancy and for 3 months after the last dose. You will need a negative pregnancy test before starting this medication. Contraception is recommended while taking  this medication and for 3 months after the last dose. Your care team can help you find the option that works for you. Do not breastfeed while taking this medication and for 3 months after the last dose. What side effects may I notice from receiving this medication? Side effects that you should report to your care team as soon as possible: Allergic reactions--skin rash, itching, hives, swelling of the face, lips, tongue, or throat Dry cough, shortness of breath or trouble breathing Eye pain, redness, irritation, or discharge with blurry or decreased vision Heart muscle inflammation--unusual weakness or fatigue, shortness of breath, chest pain, fast or irregular heartbeat, dizziness, swelling of the ankles, feet, or hands Hormone gland problems--headache, sensitivity to light, unusual weakness or fatigue, dizziness, fast or irregular heartbeat, increased sensitivity to cold or heat, excessive sweating, constipation, hair loss, increased thirst or amount of urine, tremors or shaking, irritability Infusion reactions--chest pain, shortness of breath or trouble breathing, feeling faint or lightheaded Kidney injury (glomerulonephritis)--decrease in the amount of urine, red or dark [place] urine, foamy or bubbly urine, swelling of the ankles, hands, or feet Liver injury--right upper belly pain, loss of appetite, nausea, light-colored stool, dark yellow or [place] urine, yellowing skin or eyes, unusual weakness or fatigue Pain, tingling, or numbness in the hands or feet, muscle weakness, change in vision, confusion or trouble speaking, loss of balance or coordination, trouble walking, seizures Rash, fever, and swollen lymph nodes Redness, blistering, peeling, or loosening of the skin, including inside the mouth Sudden or severe stomach pain, bloody diarrhea, fever, nausea, vomiting Side effects that usually do not require medical attention (report these to your care team if they continue or are  bothersome): Bone, joint, or muscle pain Diarrhea Fatigue Loss of appetite Nausea Skin rash This list may not describe all possible side effects. Call your doctor for medical advice about side effects. You may report side effects to FDA at 1-800-FDA-1088. Where should I keep my medication? This medication is given in a hospital or clinic. It will not be stored at home. NOTE: This sheet is a summary. It may not cover all possible information. If you have questions about this medicine, talk to your doctor, pharmacist, or health care provider.  2023 Elsevier/Gold Standard (2021-12-19 00:00:00)       To help prevent nausea and vomiting after your treatment, we encourage you to take your nausea medication as directed.  BELOW ARE SYMPTOMS THAT SHOULD BE REPORTED IMMEDIATELY: *FEVER GREATER THAN 100.4 F (38 C) OR HIGHER *CHILLS OR SWEATING *NAUSEA AND VOMITING THAT IS NOT CONTROLLED WITH YOUR NAUSEA MEDICATION *UNUSUAL SHORTNESS OF BREATH *UNUSUAL BRUISING OR BLEEDING *URINARY PROBLEMS (pain or burning when urinating, or frequent urination) *BOWEL PROBLEMS (unusual diarrhea, constipation, pain near the anus) TENDERNESS IN MOUTH AND THROAT WITH OR WITHOUT PRESENCE OF ULCERS (sore throat, sores in mouth, or a toothache) UNUSUAL RASH, SWELLING OR PAIN  UNUSUAL VAGINAL DISCHARGE OR ITCHING   Items with * indicate a potential emergency and should be followed up as soon as possible or go to the Emergency Department if any problems should occur.  Please show the CHEMOTHERAPY ALERT CARD or IMMUNOTHERAPY ALERT CARD at check-in to the Emergency   Department and triage nurse.  Should you have questions after your visit or need to cancel or reschedule your appointment, please contact [facility] [phone]  and follow the prompts.  Office hours are 8:00 a.m. to 4:30 p.m. Monday - Friday. Please note that voicemails left after 4:00 p.m. may not be returned until the following  business day.  We are closed weekends and major holidays. You have access to a nurse at all times for urgent questions. Please call the main number to the clinic [phone] and follow the prompts.  For any non-urgent questions, you may also contact your provider using MyChart. We now offer e-Visits for anyone 18 and older to request care online for non-urgent symptoms. For details visit [email or web address].   Also download the MyChart app! Go to the app store, search "MyChart", open the app, select [place], and log in with your MyChart username and password.  Masks are optional in the cancer centers. If you would like for your care team to wear a mask while they are taking care of you, please let them know. You may have one support person who is at least 67 years old accompany you for your appointments.  

## 2022-08-09 NOTE — Progress Notes (Signed)
Patient presents today for chemotherapy infusion.  Patient is in satisfactory condition with no new complaints voiced.  Vital signs are stable.  Labs reviewed by Dr. Delton Coombes during his office visit.  All labs re within treatment parameters.    D/C chemotherapy per Dr. Delton Coombes.  Patient will continue with Imfinzi every 4 weeks.  Patient tolerated treatment well with no complaints voiced.  Patient left ambulatory in stable condition.  Vital signs stable at discharge.  Follow up as scheduled.

## 2022-08-09 NOTE — Progress Notes (Signed)
Patient has been examined by Dr. Delton Coombes, and vital signs and labs have been reviewed. ANC, Creatinine, LFTs, hemoglobin, and platelets are within treatment parameters per M.D. - pt may proceed with treatment.  Will discontinue chemotherapy per MD. Will change treatment to [place] only every 4 weeks. Primary RN and pharmacy notified.

## 2022-08-09 NOTE — Patient Instructions (Addendum)
[place] at [facility] Discharge Instructions   You were seen and examined today by Dr. Delton Coombes.  He reviewed the results of your lab work which are normal/stable.   He reviewed the results of the CT scan which was stable. It shows that the spot on the liver continues to decrease in size.   We will discontinue the chemotherapy. You will continue to receive Imfinzi every 4 weeks.   Return as scheduled.    Thank you for choosing [place] at [facility] to provide your oncology and hematology care.  To afford each patient quality time with our provider, please arrive at least 15 minutes before your scheduled appointment time.   If you have a lab appointment with the [facility] please come in thru the Main Entrance and check in at the main information desk.  You need to re-schedule your appointment should you arrive 10 or more minutes late.  We strive to give you quality time with our providers, and arriving late affects you and other patients whose appointments are after yours.  Also, if you no show three or more times for appointments you may be dismissed from the clinic at the providers discretion.     Again, thank you for choosing [facility].  Our hope is that these requests will decrease the amount of time that you wait before being seen by our physicians.       _____________________________________________________________  Should you have questions after your visit to [facility], please contact our office at [phone] and follow the prompts.  Our office hours are 8:00 a.m. and 4:30 p.m. Monday - Friday.  Please note that voicemails left after 4:00 p.m. may not be returned until the following business day.  We are closed weekends and major holidays.  You do have access to a nurse 24-7, just call the main number to the clinic [phone] and do not press any options, hold on the line and a nurse will  answer the phone.    For prescription refill requests, have your pharmacy contact our office and allow 72 hours.    Due to Covid, you will need to wear a mask upon entering the hospital. If you do not have a mask, a mask will be given to you at the Main Entrance upon arrival. For doctor visits, patients may have 1 support person age 48 or older with them. For treatment visits, patients can not have anyone with them due to social distancing guidelines and our immunocompromised population.

## 2022-08-09 NOTE — Progress Notes (Signed)
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  PRIOR THERAPY: none  NGS Results: No targetable mutations.  PD-L1 (OE703) negative.  MSI-stable.  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, a 66 y.o. male, seen for follow-up of cholangiocarcinoma and toxicity assessment prior to next cycle of treatment.  He reported some dyspnea on exertion when he walked from one side of [place] to the other side.  REVIEW OF SYSTEMS:  Review of Systems  Constitutional:  Negative for unexpected weight change.  Respiratory:  Positive for shortness of breath.   Neurological:  Positive for numbness (Feet feel cold and numb with occasional bee sting).  Psychiatric/Behavioral:  Negative for sleep disturbance.   All other systems reviewed and are negative.   PAST MEDICAL/SURGICAL HISTORY:  Past Medical History:  Diagnosis Date   Anxiety    Bladder cancer ([place]) 09/11/2009   CAD (coronary artery disease), native coronary artery    a. Mildly  elevated troponin 03/2013, cath with nonobstructive disease including 50% AV groove distal stenosis before large OM   Essential hypertension    Headache(784.0)    History of migraines   Hyperglycemia    Mixed hyperlipidemia    Neuropathy    Obesity    Port-A-Cath in place 09/30/2021   Pre-diabetes    Seasonal allergies    Sleep apnea    On CPAP   Past Surgical History:  Procedure Laterality Date   BIOPSY  09/23/2021   Procedure: BIOPSY;  Surgeon: Harvel Quale, MD;  [facility];  Service: Gastroenterology;;   COLONOSCOPY  06/28/2011   Procedure: COLONOSCOPY;  Surgeon: Rogene Houston, MD;  [facility];  Service: Endoscopy;  Laterality: N/A;   COLONOSCOPY WITH PROPOFOL N/A 09/23/2021   Procedure: COLONOSCOPY WITH PROPOFOL;  Surgeon: Harvel Quale, MD;  [facility];  Service: Gastroenterology;  Laterality: N/A;  940   ESOPHAGOGASTRODUODENOSCOPY (EGD) WITH PROPOFOL N/A 09/23/2021   Procedure: ESOPHAGOGASTRODUODENOSCOPY (EGD) WITH PROPOFOL;  Surgeon: Harvel Quale, MD;  [facility];  Service: Gastroenterology;  Laterality: N/A;   IR IMAGING GUIDED PORT INSERTION  09/28/2021   IR PARACENTESIS  09/28/2021   JOINT REPLACEMENT Right    hip   LEFT HEART CATHETERIZATION WITH CORONARY ANGIOGRAM N/A 03/31/2013   Procedure: LEFT HEART CATHETERIZATION WITH CORONARY ANGIOGRAM;  Surgeon: Minus Breeding, MD;  Location[facility];  Service: Cardiovascular;  Laterality: N/A;   POLYPECTOMY  09/23/2021   Procedure: POLYPECTOMY;  Surgeon: Harvel Quale, MD;  [facility];  Service: Gastroenterology;;  TURBT  09/11/2009    SOCIAL HISTORY:  Social History   Socioeconomic History   Marital status: Divorced    Spouse name: Not on file   Number of children: 0   Years of education: college   Highest education level: Not on file  Occupational History    Employer: SELF-EMPLOYED  Tobacco Use    Smoking status: Former    Packs/day: 0.50    Years: 10.00    Total pack years: 5.00    Types: Cigarettes    Quit date: 07/10/2011    Years since quitting: 11.0   Smokeless tobacco: Never   Tobacco comments:    Quit several yrs prior to 03/2013.  Vaping Use   Vaping Use: Never used  Substance and Sexual Activity   Alcohol use: Yes    Alcohol/week: 0.0 standard drinks of alcohol    Comment: Occasional   Drug use: No   Sexual activity: Not on file  Other Topics Concern   Not on file  Social History Narrative   Not on file   Social Determinants of Health   Financial Resource Strain: Not on file  Food Insecurity: No Food Insecurity (06/05/2022)   Hunger Vital Sign    Worried About Running Out of Food in the Last Year: Never true    Ran Out of Food in the Last Year: Never true  Transportation Needs: No Transportation Needs (06/05/2022)   PRAPARE - Hydrologist (Medical): No    Lack of Transportation (Non-Medical): No  Physical Activity: Not on file  Stress: Not on file  Social Connections: Not on file  Intimate Partner Violence: Not At Risk (06/05/2022)   Humiliation, Afraid, Rape, and Kick questionnaire    Fear of Current or Ex-Partner: No    Emotionally Abused: No    Physically Abused: No    Sexually Abused: No    FAMILY HISTORY:  Family History  Problem Relation Age of Onset   COPD Father     CURRENT MEDICATIONS:  Current Outpatient Medications  Medication Sig Dispense Refill   amLODipine (NORVASC) 5 MG tablet Take 5 mg by mouth daily.     aspirin EC 81 MG tablet Take 81 mg by mouth daily.     bumetanide (BUMEX) 1 MG tablet Take 1 tablet (1 mg total) by mouth in the morning. 30 tablet 2   buPROPion (WELLBUTRIN XL) 300 MG 24 hr tablet Take 300 mg by mouth daily.     CISPLATIN IV Inject into the vein once a week. Day 1, Day 8 q 21 days Every other week.     cyclobenzaprine (FLEXERIL) 10 MG tablet Take 10 mg by mouth at bedtime as needed  for muscle spasms.     Durvalumab (IMFINZI IV) Inject into the vein every 21 ( twenty-one) days.     Elastic Bandages & Supports (ELASTIC BANDAGE 6") MISC Use compression wraps during the day for leg swelling 4 each 0   Eszopiclone 3 MG TABS Take 3 mg by mouth at bedtime. Take immediately before bedtime     famotidine (PEPCID) 20 MG tablet TAKE (1) TABLET BY MOUTH AT BEDTIME. (Patient taking differently: Take 20 mg by mouth daily.) 30 tablet 0   fenofibrate 160 MG tablet Take 160 mg by mouth daily.     FLUoxetine (PROZAC) 10 MG capsule Take 10 mg by mouth daily.     furosemide (LASIX) 40 MG tablet Take 1 tablet (40 mg total) by mouth daily. 30 tablet  1   gabapentin (NEURONTIN) 300 MG capsule Take 1 capsule (300 mg total) by mouth 3 (three) times daily. 90 capsule 3   Gauze Pads & Dressings (ABDOMINAL PAD) 8"X10" PADS Use for leaking fluid from leg swelling 360 each 0   Gemcitabine HCl (GEMZAR IV) Inject into the vein once a week. Day 1, Day 8 q 21 days Every other week     magnesium oxide (MAG-OX) 400 (240 Mg) MG tablet Take 1 tablet (400 mg total) by mouth 2 (two) times daily. (Patient taking differently: Take 400 mg by mouth 3 (three) times daily.) 90 tablet 6   metFORMIN (GLUCOPHAGE-XR) 500 MG 24 hr tablet Take 500 mg by mouth 2 (two) times daily.     nitroGLYCERIN (NITROSTAT) 0.4 MG SL tablet Place 1 tablet (0.4 mg total) under the tongue every 5 (five) minutes as needed for chest pain. (Patient not taking: Reported on 07/12/2022) 25 tablet 3   olmesartan (BENICAR) 40 MG tablet Take 1 tablet (40 mg total) by mouth daily. 30 tablet 6   omeprazole (PRILOSEC) 40 MG capsule Take 40 mg by mouth daily as needed (reflux).     pantoprazole (PROTONIX) 40 MG tablet Take 1 tablet (40 mg total) by mouth daily. (Patient taking differently: Take 40 mg by mouth daily as needed (reflux).) 90 tablet 3   pramipexole (MIRAPEX) 0.75 MG tablet Take 1 tablet (0.75 mg total) by mouth 3 (three) times daily. 90 tablet 0    RYBELSUS 7 MG TABS Take 14 mg by mouth daily.     tamsulosin (FLOMAX) 0.4 MG CAPS capsule Take 0.4 mg by mouth daily.     traMADol (ULTRAM) 50 MG tablet Take 50 mg by mouth 4 (four) times daily as needed for moderate pain.     No current facility-administered medications for this visit.   Facility-Administered Medications Ordered in Other Visits  Medication Dose Route Frequency Provider Last Rate Last Admin   magnesium sulfate 2 GM/50ML IVPB             ALLERGIES:  No Known Allergies  PHYSICAL EXAM:  Performance status (ECOG): 0 - Asymptomatic  There were no vitals filed for this visit. Wt Readings from Last 3 Encounters:  08/09/22 276 lb 12.8 oz (125.6 kg)  07/12/22 269 lb 11.2 oz (122.3 kg)  06/14/22 263 lb 9.6 oz (119.6 kg)   Physical Exam Vitals reviewed.  Constitutional:      Appearance: Normal appearance.  Cardiovascular:     Rate and Rhythm: Normal rate and regular rhythm.     Pulses: Normal pulses.     Heart sounds: Normal heart sounds.  Pulmonary:     Effort: Pulmonary effort is normal.     Breath sounds: Normal breath sounds.  Neurological:     General: No focal deficit present.     Mental Status: He is alert and oriented to person, place, and time.  Psychiatric:        Mood and Affect: Mood normal.        Behavior: Behavior normal.    LABORATORY DATA:  I have reviewed the labs as listed.     Latest Ref Rng & Units 07/12/2022    8:10 AM 06/28/2022    8:03 AM 06/14/2022    7:56 AM  CBC  WBC 4.0 - 10.5 K/uL 5.1  5.2  4.8   Hemoglobin 13.0 - 17.0 g/dL 10.9  11.1  10.5   Hematocrit 39.0 - 52.0 % 33.5  34.7  32.8  Platelets 150 - 400 K/uL 200  235  163       Latest Ref Rng & Units 07/12/2022    8:10 AM 06/28/2022    7:56 AM 06/14/2022    7:56 AM  CMP  Glucose 70 - 99 mg/dL 128  138  128   BUN 8 - 23 mg/dL 50  39  42   Creatinine 0.61 - 1.24 mg/dL 1.70  1.38  1.34   Sodium 135 - 145 mmol/L 138  136  136   Potassium 3.5 - 5.1 mmol/L 3.9  3.9  4.4    Chloride 98 - 111 mmol/L 105  101  102   CO2 22 - 32 mmol/L _0 Calcium 8.9 - 10.3 mg/dL 9.0  9.0  9.1   Total Protein 6.5 - 8.1 g/dL 6.4  6.5  6.5   Total Bilirubin 0.3 - 1.2 mg/dL 0.5  0.4  0.4   Alkaline Phos 38 - 126 U/L 76  68  71   AST 15 - 41 U/L _1 ALT 0 - 44 U/L _2 DIAGNOSTIC IMAGING:  I have independently reviewed the scans and discussed with the patient. CT CHEST ABDOMEN PELVIS W CONTRAST  Result Date: 08/07/2022 CLINICAL DATA:  Gallbladder/biliary cancer monitor/restaging of cholangiocarcinoma post chemotherapy. * Tracking Code: BO *. EXAM: CT CHEST, ABDOMEN, AND PELVIS WITH CONTRAST TECHNIQUE: Multidetector CT imaging of the chest, abdomen and pelvis was performed following the standard protocol during bolus administration of intravenous contrast. RADIATION DOSE REDUCTION: This exam was performed according to the departmental dose-optimization program which includes automated exposure control, adjustment of the mA and/or kV according to patient size and/or use of iterative reconstruction technique. CONTRAST:  34m OMNIPAQUE IOHEXOL 300 MG/ML  SOLN COMPARISON:  Multiple priors including CT chest, abdomen and pelvis May 23, 2022. FINDINGS: CT CHEST FINDINGS Cardiovascular: Right chest wall Port-A-Cath with tip at the superior cavoatrial junction. Aortic atherosclerosis. No central pulmonary embolus on this nondedicated study. Normal size heart. No significant pericardial effusion/thickening. Mediastinum/Nodes: No suspicious thyroid nodule. No pathologically enlarged mediastinal, hilar or axillary lymph nodes. The esophagus is grossly unremarkable. Lungs/Pleura: No suspicious pulmonary nodules or masses. Scattered subpleural reticulations predominantly involving the anterior upper lobes commonly reflects sequela of prior infectious or inflammatory etiology. Mild diffuse bronchial wall thickening. No pleural effusion. No pneumothorax. Musculoskeletal: No  aggressive lytic or blastic lesion of bone. Multilevel degenerative changes spine. Degenerative changes bilateral shoulders. CT ABDOMEN PELVIS FINDINGS Hepatobiliary: Cholelithiasis. Similar gallbladder wall thickening with pericholecystic fluid in a nondistended gallbladder. Segment IVb hepatic lesion measures 1.3 x 1.0 cm on image 61/2 previously 1.8 x 1.0 cm no new suspicious hepatic lesion. No significant biliary ductal dilation. Pancreas: No pancreatic ductal dilation or evidence of acute inflammation. Spleen: No splenomegaly or focal splenic lesion. Adrenals/Urinary Tract: Bilateral adrenal glands appear normal. No hydronephrosis. Kidneys demonstrate symmetric enhancement and excretion of contrast. Urinary bladder is unremarkable for degree of distension. Stomach/Bowel: Radiopaque enteric contrast material traverses the ileocecal valve. Stomach is unremarkable for degree of distension. No pathologic dilation of small or large bowel. The appendix and terminal ileum appear normal. Colonic diverticulosis without findings of acute diverticulitis. Vascular/Lymphatic: Aortic atherosclerosis. No pathologically enlarged abdominal or pelvic lymph nodes. Stable mildly prominent iliac side chain and inguinal lymph nodes for instance unchanged size of a left external iliac lymph node measuring 9 mm in short axis on image 117/2 and  unchanged size of a right external iliac lymph node measures 7 mm in short axis on image 118/2 Reproductive: Prostate is unremarkable. Other: Fat containing left-greater-than-right inguinal hernias. Musculoskeletal: No aggressive lytic or blastic lesion of bone. Multilevel degenerative changes spine. Prior right hip arthroplasty. Unchanged lipoma between the left gluteal muscles. IMPRESSION: 1. Continued decrease in size of the hepatic segment IVb hepatic lesion. No new suspicious hepatic lesions. Underlying gallbladder wall thickening and pericholecystic fluid appears similar, attributed to  patient's cholangiocarcinoma. 2. No evidence of new or progressive disease in the chest, abdomen or pelvis. 3. Colonic diverticulosis without findings of acute diverticulitis. 4.  Aortic Atherosclerosis (ICD10-I70.0). Electronically Signed   By: Dahlia Bailiff M.D.   On: 08/07/2022 11:51     ASSESSMENT:  Liver lesion/peritoneal carcinomatosis: - Patient seen at the request of Dr. Delphina Cahill - Reported pressure on the sides of the abdomen with slight pain for the last 1 month.  He also reported pain in the epigastric region. - He had lost 20 pounds in the last 3 to 4 months intentionally, cutting back on sugars.  He was also started on semaglutide. - Colonoscopy on 06/28/2011 with a benign polypoid colonic mucosa in the descending colon.  No evidence of malignancy. - MRI of the brain was negative. - EGD and colonoscopy on 09/23/2018 did not reveal any malignancies. 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  IHC positive for CK7, CDX2 and negative for GATA3.  Findings suggestive of upper GI or pancreaticobiliary primary. - Cycle 1 of gemcitabine, cisplatin and durvalumab started on 10/05/2021. - NGS: No targetable mutations.  PD-L1 (CK221) negative.  MSI-stable.  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   Social/family history: - He lives at home by himself.  He records voiceover/narrations. - Non-smoker. - Father died of MDS.  Paternal grandfather had prostate cancer.  Maternal grandfather had cancer.  3.  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 Alinda Money.  Reportedly received 1 treatment of intravesical chemo and has been on surveillance since then.  Last surveillance visit was in 2020.   PLAN:  Cholangiocarcinoma with peritoneal carcinomatosis: - CT CAP (08/07/2022): Continued decrease in size of the hepatic lesion with no new liver lesions.  No evidence of progressive disease. - Last CEA was 3.0. - Labs today shows  creatinine improved to 1.32.  CBC was grossly normal with mild anemia.  LFTs are normal. - Recommend starting him back on Imfinzi with holding chemotherapy as it has caused lot of fluid retention.  Will plan to continue Imfinzi until disease progression or intolerance. - RTC 4 weeks for follow-up.   Hypertension: -Continue Benicar half tablet daily.  Blood pressure today is 135/68. - Continue Bumex 1 mg every other day.  3.  Neuropathy/"drawing" of legs: - Continue Mirapex 0.75 mg 3 times daily.  4.  Sleeping difficulty: - Continue Lunesta as needed.  5.  Hypomagnesemia: - Continue magnesium 3 times daily.  Magnesium normal today.   Orders placed this encounter:  No orders of the defined types were placed in this encounter.    Derek Jack, MD [place] [phone]

## 2022-08-10 ENCOUNTER — Other Ambulatory Visit: Payer: Self-pay

## 2022-08-15 ENCOUNTER — Other Ambulatory Visit: Payer: Self-pay

## 2022-08-16 IMAGING — PT NM PET TUM IMG INITIAL (PI) SKULL BASE T - THIGH
6 of 7 series · 24 of 25 positions shown · non-contrast
Comparison: CT abdomen and pelvis August 29, 2021.

CLINICAL DATA: Initial treatment strategy for history of bladder
cancer.

EXAM:
NUCLEAR MEDICINE PET SKULL BASE TO THIGH
TECHNIQUE: 13.392 mCi F-18 FDG was injected intravenously. Full-ring PET
imaging was performed from the skull base to thigh after the
radiotracer. CT data was obtained and used for attenuation
correction and anatomic localization.
Fasting blood glucose: 89 mg/dl

[Series 3: ct slices · axial · 4.0mm · 0.98mm/px · z∈[-992,-28]mm · 4 of 242 slices shown]
[im 1/242]
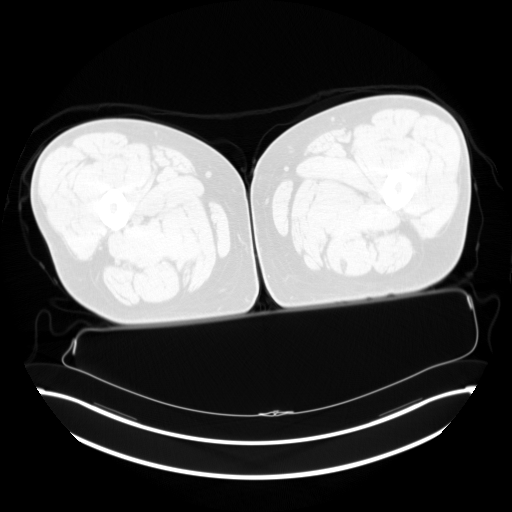
[im 81/242]
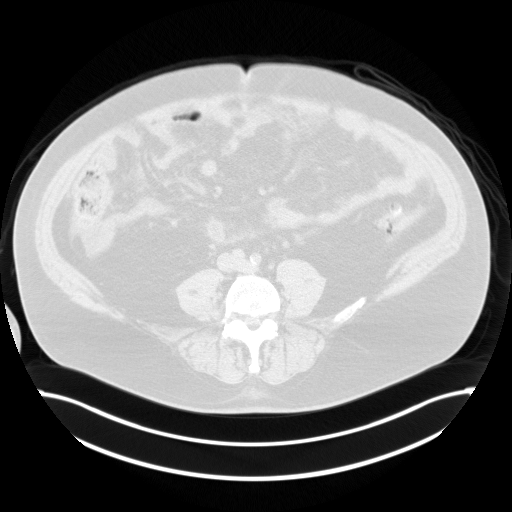
[im 161/242]
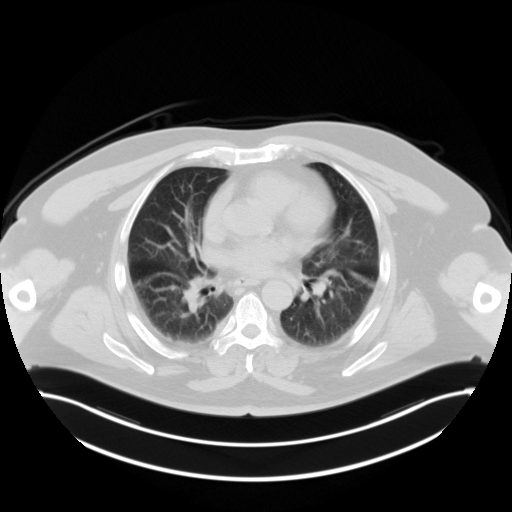
[im 242/242  brain]
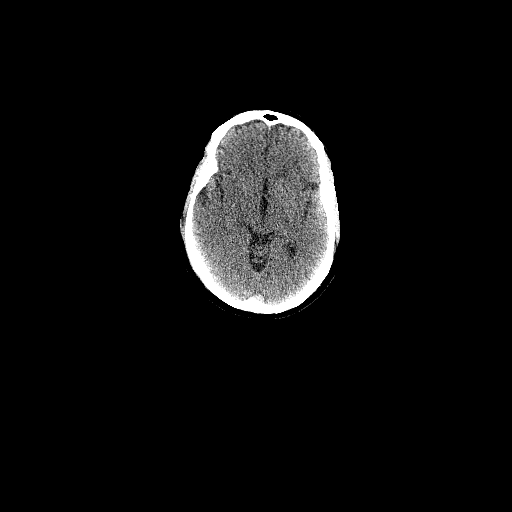

[Series 4: pet ac · axial · 4.0mm · 4.11mm/px · z∈[-992,-28]mm · 4 of 242 slices shown]
[im 1/242]
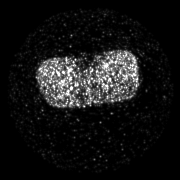
[im 81/242]
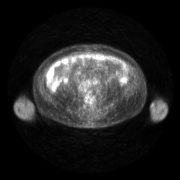
[im 161/242]
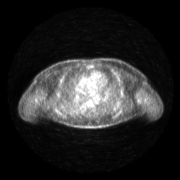
[im 242/242]
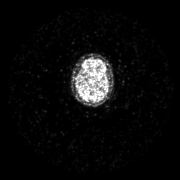

[Series 5: pet nac · axial · 4.0mm · 4.11mm/px · z∈[-992,-28]mm · 4 of 242 slices shown]
[im 1/242]
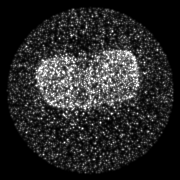
[im 81/242]
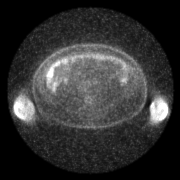
[im 161/242]
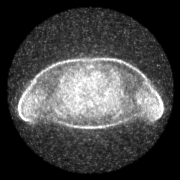
[im 242/242]
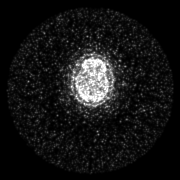

[Series 606: fused tra · 8 of 482 slices shown]
[im 1/482]
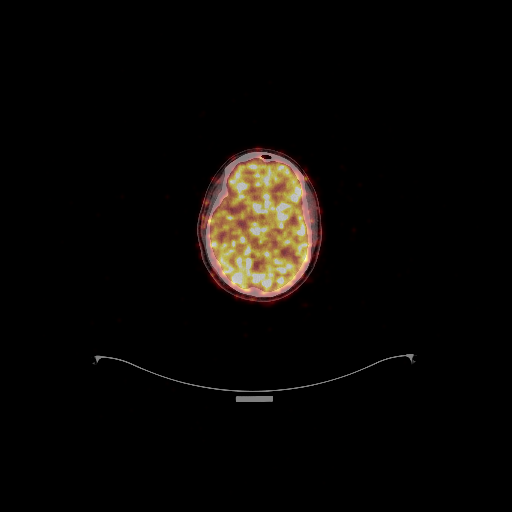
[im 69/482]
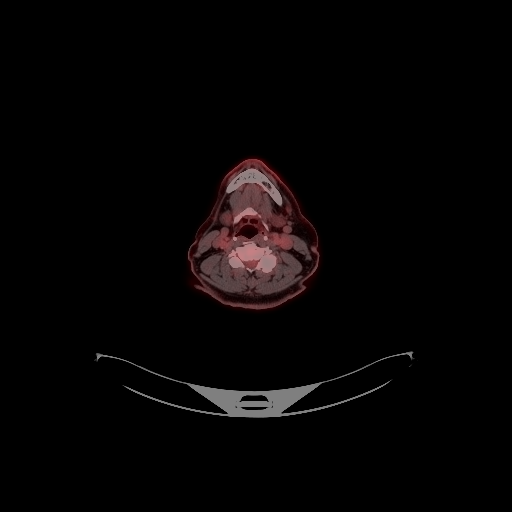
[im 138/482]
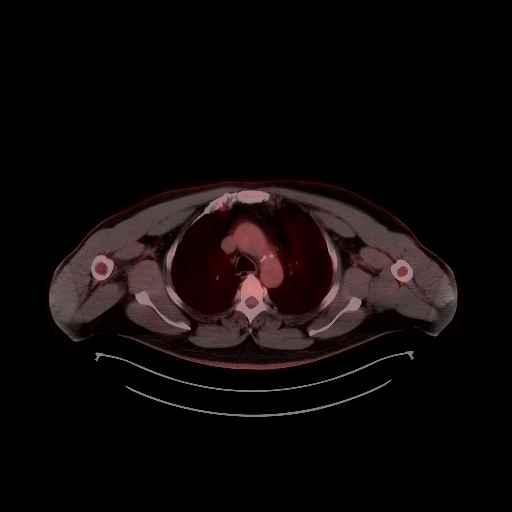
[im 207/482]
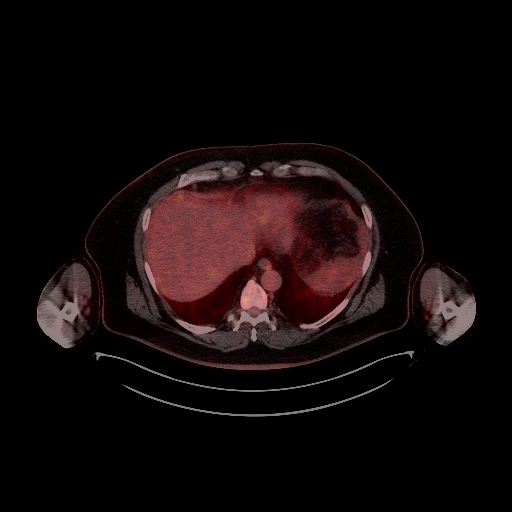
[im 275/482]
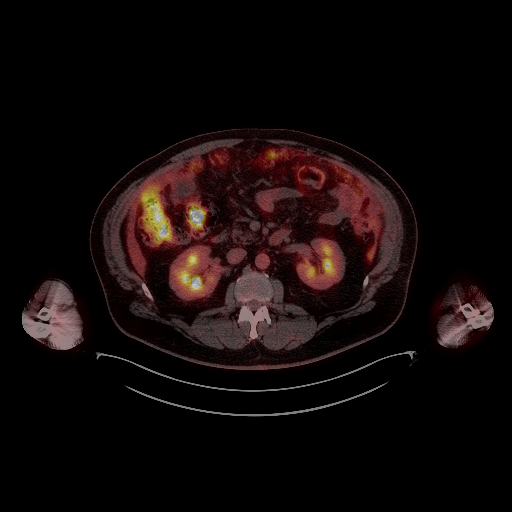
[im 344/482]
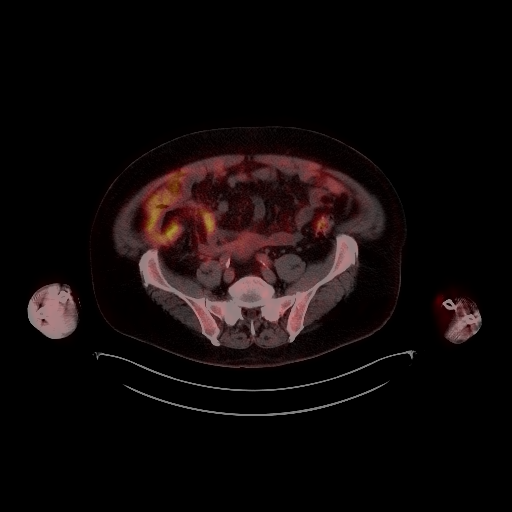
[im 413/482]
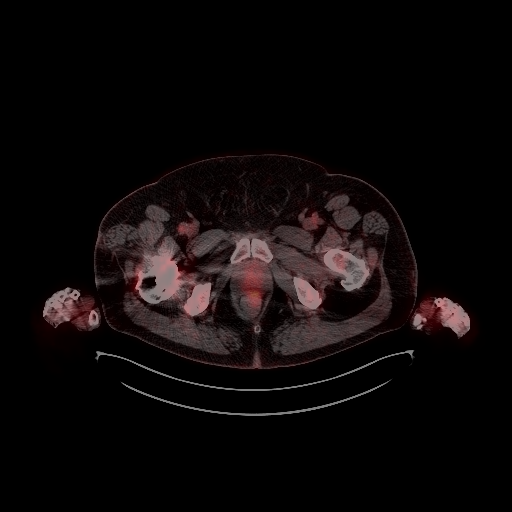
[im 482/482]
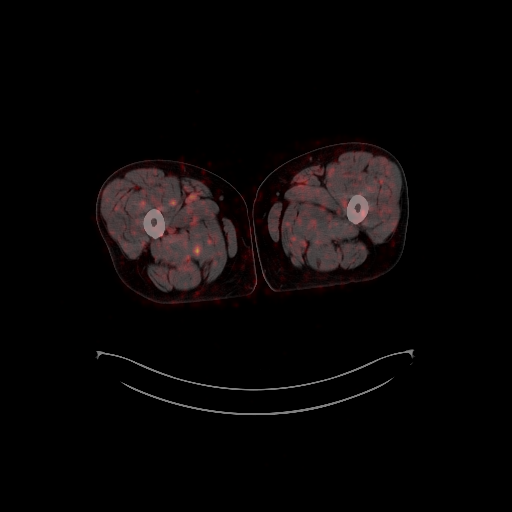

[Series 608: fused cor · 3 of 168 slices shown]
[im 1/168]
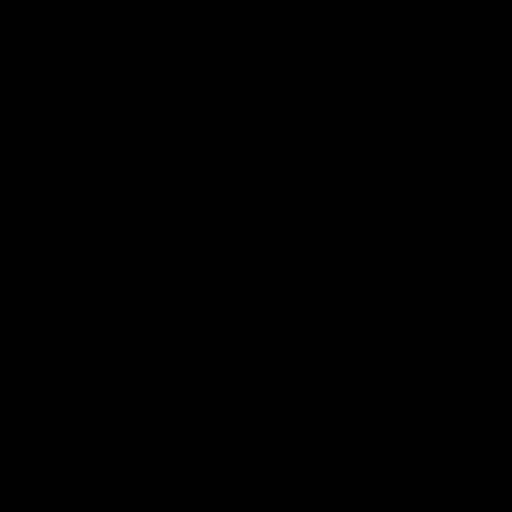
[im 84/168]
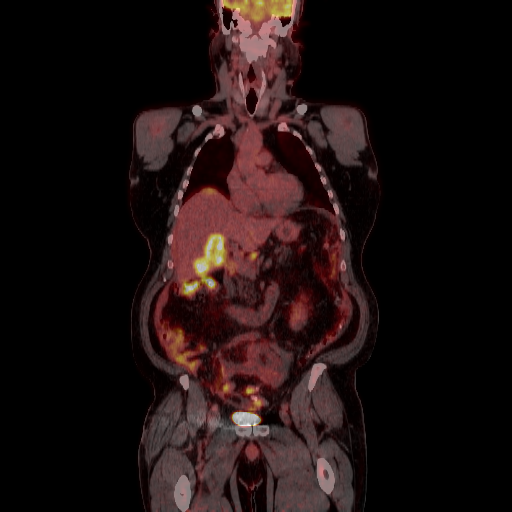
[im 168/168]
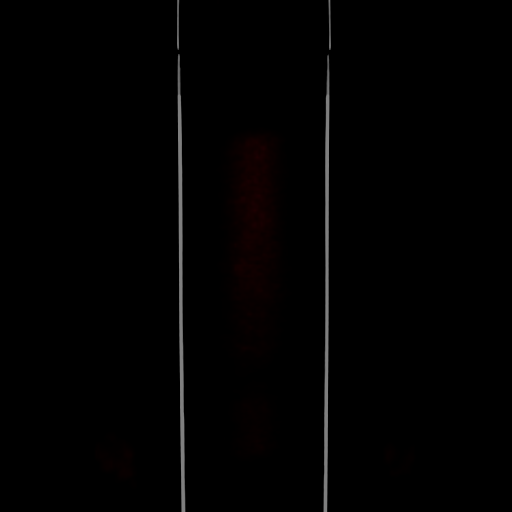

[Series 609: mip cine · coronal · 2.00mm/px · 1 of 48 slices shown]
[im 1/48]
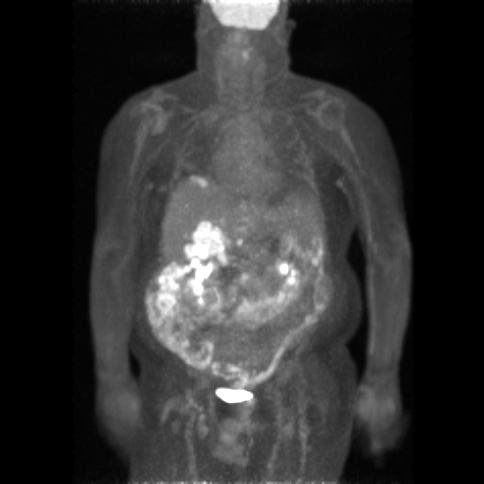

[24 of 25 positions shown; findings below may reference images not displayed]

FINDINGS: Mediastinal blood pool activity: SUV max

Liver activity: SUV max NA

NECK: Mild asymmetry with respect to uptake at the LEFT base of
tongue (image [DATE]) maximum SUV 4.9 mild asymmetric fullness in this
location of soft tissue. No hypermetabolic lymph nodes in the neck.

Incidental CT findings: none

CHEST: No hypermetabolic mediastinal or hilar nodes. No suspicious
pulmonary nodules on the CT scan.

Incidental CT findings: Aortic atherosclerosis and signs of coronary
artery disease. Basilar atelectasis without effusion or
consolidation. No suspicious pulmonary nodule. Top normal heart
size.

ABDOMEN/PELVIS: FDG avid disease in the liver and peritoneum as well
as upper abdominal lymph nodes.

(Image 120/3) area measuring up to 6.5 x 5.4 cm in hepatic
subsegment IV showing activity that extends beyond the liver margin
to involve the gastric antrum adjacent with a maximum SUV of 19.

Hepatic subsegment V (image 118/3) area measuring approximately
cm greatest axial dimension with a maximum SUV up to 16.

Hepatic subsegment VIII low attenuation near the dome of the RIGHT
hemi liver difficult to localize but with approximately 18 mm
greatest axial dimension and a maximum SUV of 6.3. Is ascites along
the liver margin. Nodularity along the liver margin.

(Image 121/3) gastrohepatic lymph node measuring 16 mm short axis
with a maximum SUV of 8.1.

Diffuse peritoneal and omental nodularity. Thickening of mesenteric
planes. FDG avid omental caking (image 156/3 is an example with
infiltration of the omentum without measurable lesion, discrete
lesion but with a maximum SUV of 8.0. Nodularity in small volume
fluid extends along the LEFT and RIGHT pericolic gutter and into the
pelvis. Small volume pelvic fluid with similar volume compared to
recent imaging with some increased metabolic activity in the
dependent aspect of pelvic fluid.

Incidental CT findings: Cholelithiasis and sludge in the
gallbladder. No acute upper abdominal process. Gallbladder is
slightly more distended than on previous imaging. No signs of bowel
obstruction. Colonic diverticulosis.

SKELETON: No focal hypermetabolic activity to suggest skeletal
metastasis.

Incidental CT findings: None aside from RIGHT hip arthroplasty and
spinal degenerative changes.
IMPRESSION: Signs of hepatic metastatic disease and diffuse peritoneal
carcinomatosis.

Gastric antral involvement from peritoneal disease is contiguous
with hepatic metastatic disease.

Nodal involvement in the upper abdomen.

Mild fullness of LEFT base of tongue and mildly asymmetric activity
in this area, consider correlation with direct visualization to
exclude mucosal lesion as warranted.

Increasing distension of the gallbladder with cholelithiasis.
Correlate with any RIGHT upper quadrant symptoms with worsening and
consider dedicated assessment with ultrasound as warranted.
Stranding and nodularity throughout the peritoneum including about
the gallbladder is related to peritoneal carcinomatosis.

## 2022-08-17 ENCOUNTER — Ambulatory Visit: Payer: Medicare Other

## 2022-08-17 ENCOUNTER — Other Ambulatory Visit: Payer: Medicare Other

## 2022-08-20 ENCOUNTER — Other Ambulatory Visit: Payer: Self-pay

## 2022-08-23 IMAGING — US US BIOPSY CORE LIVER
1 series · 12 of 25 positions shown · non-contrast
Comparison: none

INDICATION: 65-year-old with a hepatic lesion, ascites and evidence of
peritoneal disease. Tissue diagnosis is needed.

[Series 1: us biopsy (liver) · 38 acquisitions, 12 frames shown]
[im 2/38]
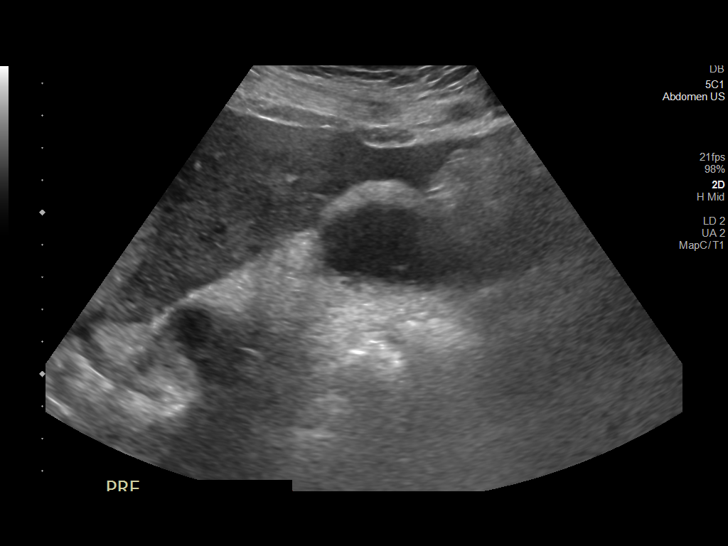
[im 5/38]
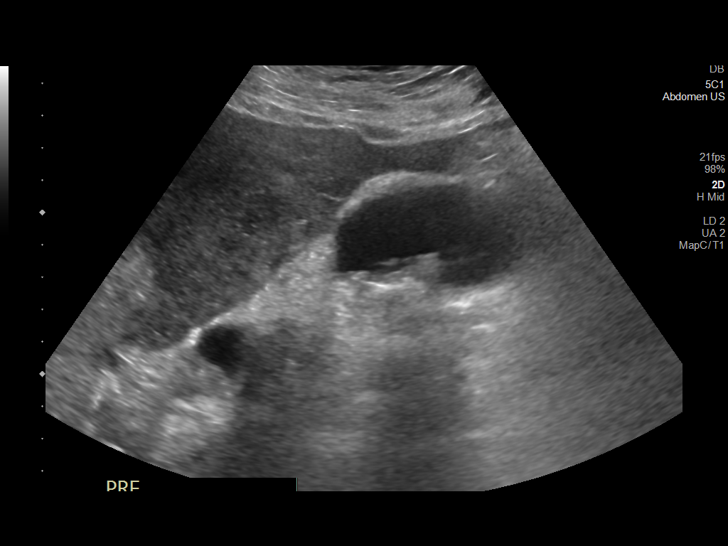
[im 8/38]
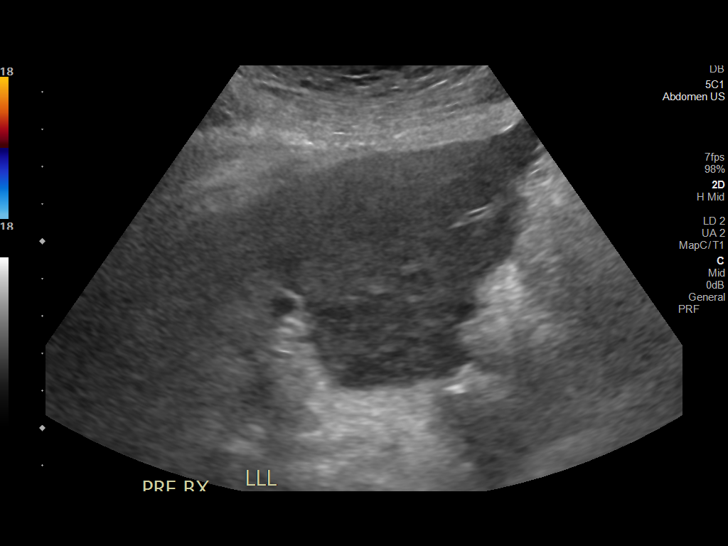
[im 11/38]
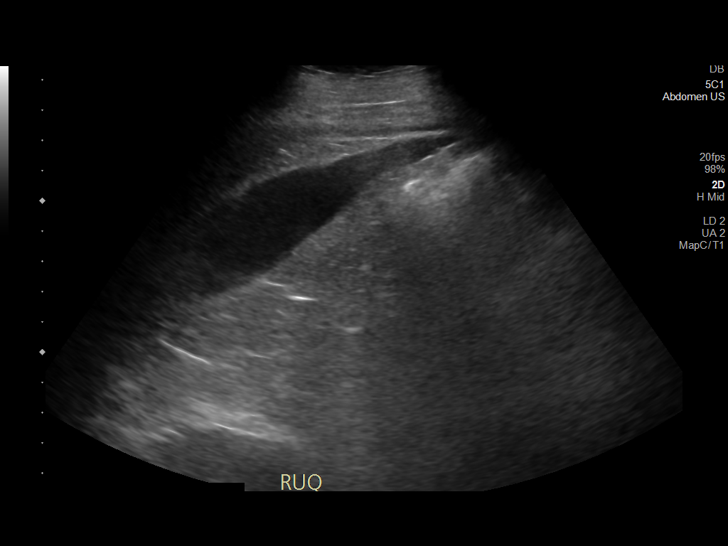
[im 14/38]
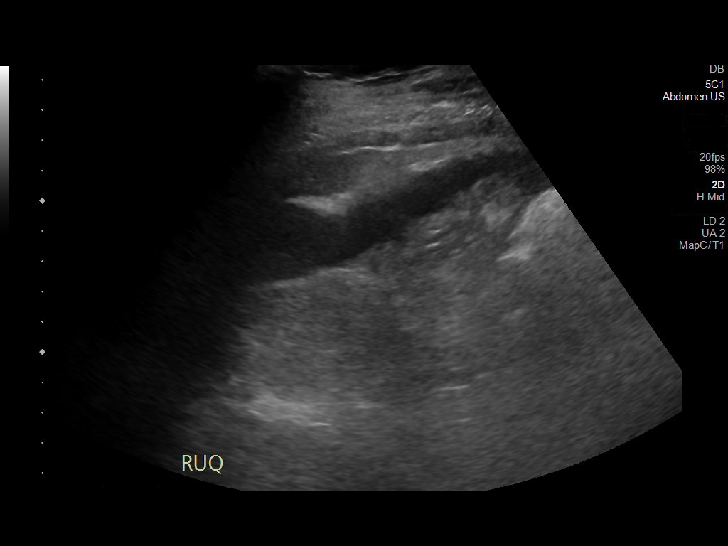
[im 17/38]
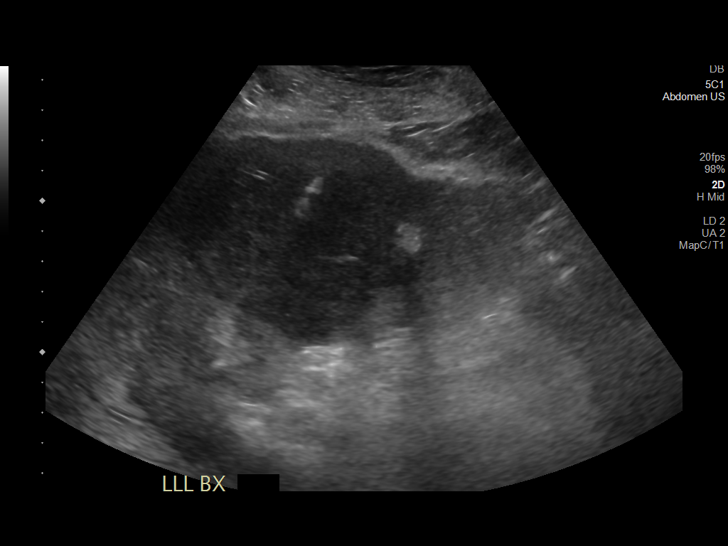
[im 21/38]
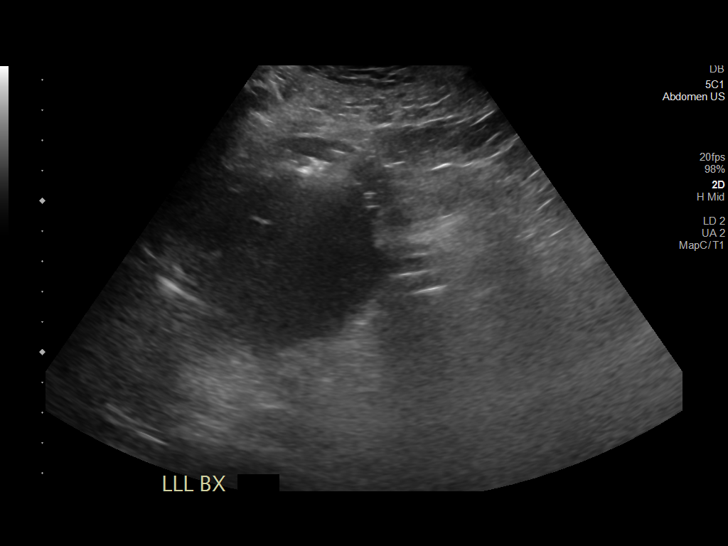
[im 24/38]
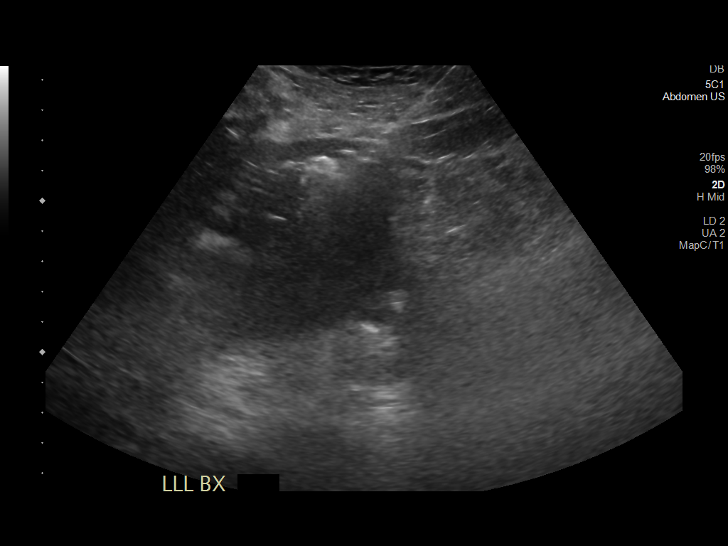
[im 27/38]
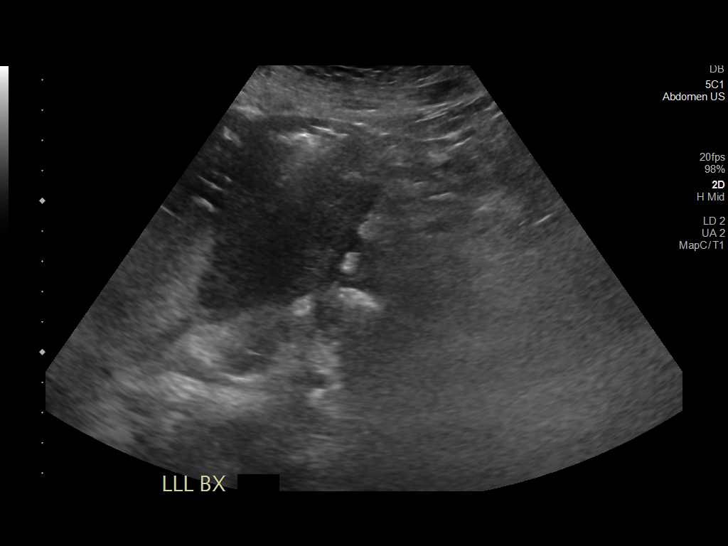
[im 30/38]
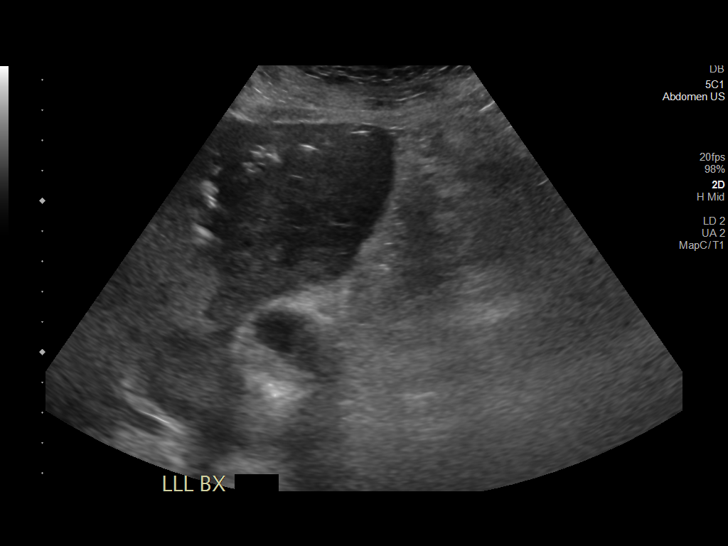
[im 33/38]
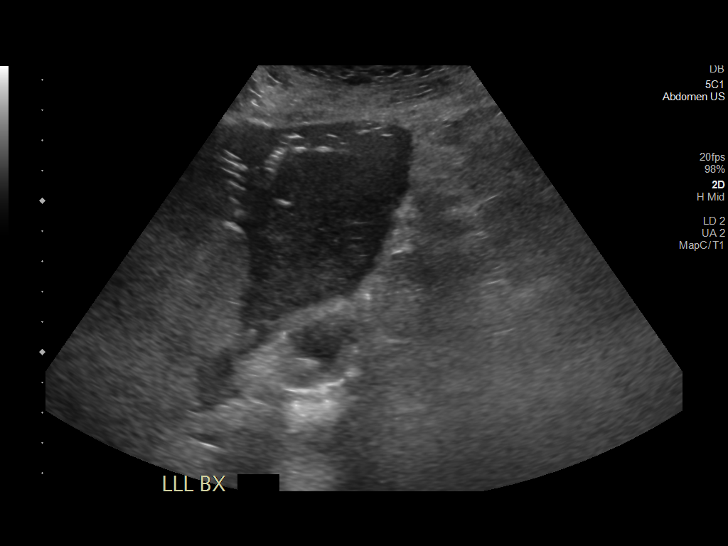
[im 36/38]
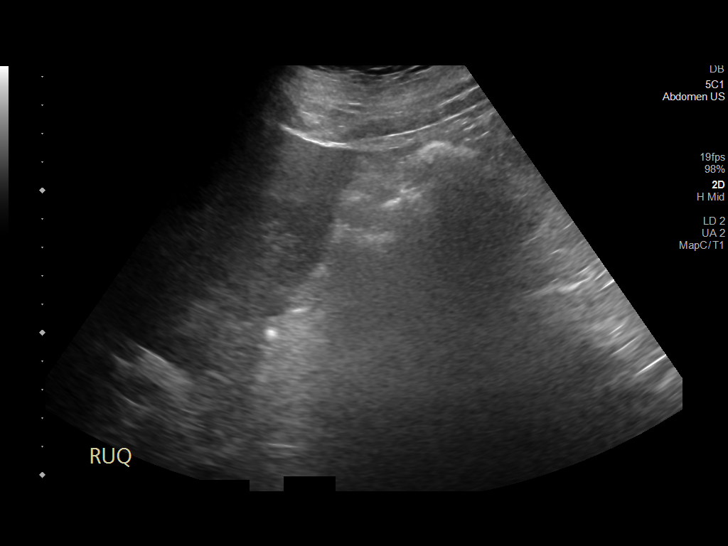

[12 of 25 positions shown; findings below may reference images not displayed]

EXAM:
ULTRASOUND-GUIDED PARACENTESIS

ULTRASOUND-GUIDED LIVER LESION BIOPSY

MEDICATIONS:
Moderate sedation

ANESTHESIA/SEDATION:
Moderate (conscious) sedation was employed during this procedure. A
total of Versed 3.0mg and fentanyl 150 mcg was administered
intravenously at the order of the provider performing the procedure.

Total intra-service moderate sedation time: 45 minutes.

Patient's level of consciousness and vital signs were monitored
continuously by radiology nurse throughout the procedure under the
supervision of the provider performing the procedure.

FLUOROSCOPY TIME:  None

COMPLICATIONS:
None immediate.

PROCEDURE:
Patient was complaining of abdominal distension. Ultrasound
demonstrated perihepatic ascites. Patient was consented for
ultrasound-guided paracentesis and the ultrasound-guided liver
biopsy. Paracentesis was performed to treat the patient's abdominal
distension, obtain cytology and decrease the risk of bleeding
following the liver biopsy.

Informed written consent was obtained from the patient after a
thorough discussion of the procedural risks, benefits and
alternatives. All questions were addressed. A timeout was performed
prior to the initiation of the procedure.

Ultrasound was used to identify the lesion in the left hepatic lobe
and a small pocket of fluid in the right lateral abdomen. The
anterior abdomen and right lateral abdomen was prepped with
chlorhexidine and sterile field was created. Skin along the right
lateral abdomen was anesthetized with 1% lidocaine. Small incision
was made. Using ultrasound guidance, a Safe-T-Centesis catheter was
directed into the peritoneal fluid. Paracentesis was performed 1.5 L
of amber colored fluid was removed. During the paracentesis,
attention was directed to the left hepatic lesion. The skin along
the anterior abdomen was anesthetized with 1% lidocaine and another
small incision was made. Using ultrasound guidance, a 17 gauge
coaxial needle was directed into the hypoechoic liver lesion. Three
core biopsies were obtained with an 18 gauge core device. Specimens
placed in formalin. Gel-Foam slurry was injected as the 17 gauge
needle was removed. Paracentesis catheter was removed and bandages
were placed.
FINDINGS: Ascites was noted in the right lateral abdomen and perihepatic
space. 1.5 L of fluid was removed. Fluid was sent for cytology. The
medial segment of left hepatic lobe, segment 4, was very hypoechoic
compared to the lateral segment. This corresponds with the tumor on
the previous PET-CT. Biopsy needle was confirmed within the area of
concern. Three adequate specimens were obtained. No immediate
bleeding or hematoma formation.
IMPRESSION: 1. Successful ultrasound-guided core biopsy of the left hepatic
lesion.
2. Successful ultrasound-guided paracentesis. 1.5 L of fluid was
removed and fluid was sent for cytology.

## 2022-08-30 ENCOUNTER — Other Ambulatory Visit: Payer: Self-pay | Admitting: Hematology

## 2022-09-03 ENCOUNTER — Other Ambulatory Visit: Payer: Self-pay

## 2022-09-05 ENCOUNTER — Inpatient Hospital Stay: Payer: Medicare Other | Attending: Hematology

## 2022-09-05 ENCOUNTER — Inpatient Hospital Stay (HOSPITAL_BASED_OUTPATIENT_CLINIC_OR_DEPARTMENT_OTHER): Payer: Medicare Other | Admitting: Hematology

## 2022-09-05 ENCOUNTER — Inpatient Hospital Stay: Payer: Medicare Other

## 2022-09-05 VITALS — BP 113/52 | HR 71 | Temp 99.0°F | Resp 20 | Ht 72.0 in | Wt 276.0 lb

## 2022-09-05 VITALS — BP 119/77 | HR 70 | Temp 97.5°F | Resp 18

## 2022-09-05 DIAGNOSIS — I10 Essential (primary) hypertension: Secondary | ICD-10-CM | POA: Diagnosis not present

## 2022-09-05 DIAGNOSIS — Z87891 Personal history of nicotine dependence: Secondary | ICD-10-CM | POA: Insufficient documentation

## 2022-09-05 DIAGNOSIS — Z95828 Presence of other vascular implants and grafts: Secondary | ICD-10-CM

## 2022-09-05 DIAGNOSIS — Z5112 Encounter for antineoplastic immunotherapy: Secondary | ICD-10-CM | POA: Diagnosis not present

## 2022-09-05 DIAGNOSIS — G629 Polyneuropathy, unspecified: Secondary | ICD-10-CM | POA: Insufficient documentation

## 2022-09-05 DIAGNOSIS — C221 Intrahepatic bile duct carcinoma: Secondary | ICD-10-CM | POA: Insufficient documentation

## 2022-09-05 DIAGNOSIS — Z8551 Personal history of malignant neoplasm of bladder: Secondary | ICD-10-CM | POA: Insufficient documentation

## 2022-09-05 DIAGNOSIS — C787 Secondary malignant neoplasm of liver and intrahepatic bile duct: Secondary | ICD-10-CM

## 2022-09-05 DIAGNOSIS — R0602 Shortness of breath: Secondary | ICD-10-CM | POA: Insufficient documentation

## 2022-09-05 DIAGNOSIS — Z08 Encounter for follow-up examination after completed treatment for malignant neoplasm: Secondary | ICD-10-CM

## 2022-09-05 DIAGNOSIS — Z79899 Other long term (current) drug therapy: Secondary | ICD-10-CM | POA: Insufficient documentation

## 2022-09-05 LAB — COMPREHENSIVE METABOLIC PANEL
ALT: 24 U/L (ref 0–44)
AST: 25 U/L (ref 15–41)
Albumin: 3.6 g/dL (ref 3.5–5.0)
Alkaline Phosphatase: 74 U/L (ref 38–126)
Anion gap: 7 (ref 5–15)
BUN: 35 mg/dL — ABNORMAL HIGH (ref 8–23)
CO2: 27 mmol/L (ref 22–32)
Calcium: 9 mg/dL (ref 8.9–10.3)
Chloride: 105 mmol/L (ref 98–111)
Creatinine, Ser: 1.6 mg/dL — ABNORMAL HIGH (ref 0.61–1.24)
GFR, Estimated: 47 mL/min — ABNORMAL LOW (ref >60)
Glucose, Bld: 131 mg/dL — ABNORMAL HIGH (ref 70–99)
Potassium: 4 mmol/L (ref 3.5–5.1)
Sodium: 139 mmol/L (ref 135–145)
Total Bilirubin: 0.4 mg/dL (ref 0.3–1.2)
Total Protein: 6.7 g/dL (ref 6.5–8.1)

## 2022-09-05 LAB — TSH: TSH: 2.481 u[IU]/mL (ref 0.350–4.500)

## 2022-09-05 LAB — CBC WITH DIFFERENTIAL/PLATELET
Abs Immature Granulocytes: 0.02 10*3/uL (ref 0.00–0.07)
Basophils Absolute: 0 10*3/uL (ref 0.0–0.1)
Basophils Relative: 1 %
Eosinophils Absolute: 0.2 10*3/uL (ref 0.0–0.5)
Eosinophils Relative: 4 %
HCT: 36.5 % — ABNORMAL LOW (ref 39.0–52.0)
Hemoglobin: 11.9 g/dL — ABNORMAL LOW (ref 13.0–17.0)
Immature Granulocytes: 0 %
Lymphocytes Relative: 23 %
Lymphs Abs: 1.1 10*3/uL (ref 0.7–4.0)
MCH: 30.9 pg (ref 26.0–34.0)
MCHC: 32.6 g/dL (ref 30.0–36.0)
MCV: 94.8 fL (ref 80.0–100.0)
Monocytes Absolute: 0.6 10*3/uL (ref 0.1–1.0)
Monocytes Relative: 11 %
Neutro Abs: 3 10*3/uL (ref 1.7–7.7)
Neutrophils Relative %: 61 %
Platelets: 216 10*3/uL (ref 150–400)
RBC: 3.85 MIL/uL — ABNORMAL LOW (ref 4.22–5.81)
RDW: 13.1 % (ref 11.5–15.5)
WBC: 5 10*3/uL (ref 4.0–10.5)
nRBC: 0 % (ref 0.0–0.2)

## 2022-09-05 LAB — MAGNESIUM: Magnesium: 2 mg/dL (ref 1.7–2.4)

## 2022-09-05 MED ORDER — HEPARIN SOD (PORK) LOCK FLUSH 100 UNIT/ML IV SOLN
500.0000 [IU] | Freq: Once | INTRAVENOUS | Status: AC | PRN
  Administered 2022-09-05: 500 [IU]

## 2022-09-05 MED ORDER — SODIUM CHLORIDE 0.9 % IV SOLN: Freq: Once | INTRAVENOUS | Status: AC

## 2022-09-05 MED ORDER — SODIUM CHLORIDE 0.9% IV SOLUTION: Freq: Once | INTRAVENOUS | Status: AC

## 2022-09-05 MED ORDER — SODIUM CHLORIDE 0.9 % IV SOLN
1500.0000 mg | Freq: Once | INTRAVENOUS | Status: AC
  Administered 2022-09-05: 1500 mg via INTRAVENOUS
  Filled 2022-09-05: qty 30

## 2022-09-05 MED ORDER — SODIUM CHLORIDE 0.9% FLUSH
10.0000 mL | Freq: Once | INTRAVENOUS | Status: AC
  Administered 2022-09-05: 10 mL via INTRAVENOUS

## 2022-09-05 MED ORDER — SODIUM CHLORIDE 0.9% FLUSH
10.0000 mL | INTRAVENOUS | Status: DC | PRN
  Administered 2022-09-05: 10 mL

## 2022-09-05 NOTE — Progress Notes (Signed)
Marvin Carr 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  PRIOR THERAPY: none  NGS Results: No targetable mutations.  PD-L1 (BL390) negative.  MSI-stable.  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, a 66 y.o. male, seen for follow-up of cholangiocarcinoma and toxicity assessment prior to next cycle of immunotherapy.  He started back on immunotherapy with Durvalumab 4 weeks ago.  Denied any worsening of baseline shortness of breath.  Energy levels are 75%.  Denies any skin rashes or diarrhea.  REVIEW OF SYSTEMS:  Review of Systems  Constitutional:  Negative for unexpected weight change.  Respiratory:  Positive for shortness of breath.   Neurological:  Positive for numbness (Feet feel cold and numb with occasional bee sting).  Psychiatric/Behavioral:  Positive for sleep disturbance.   All other systems reviewed and are negative.   PAST MEDICAL/SURGICAL HISTORY:  Past Medical History:  Diagnosis Date   Anxiety    Bladder cancer ([place])  09/11/2009   CAD (coronary artery disease), native coronary artery    a. Mildly elevated troponin 03/2013, cath with nonobstructive disease including 50% AV groove distal stenosis before large OM   Essential hypertension    Headache(784.0)    History of migraines   Hyperglycemia    Mixed hyperlipidemia    Neuropathy    Obesity    Port-A-Cath in place 09/30/2021   Pre-diabetes    Seasonal allergies    Sleep apnea    On CPAP   Past Surgical History:  Procedure Laterality Date   BIOPSY  09/23/2021   Procedure: BIOPSY;  Surgeon: Harvel Quale, MD;  [facility];  Service: Gastroenterology;;   COLONOSCOPY  06/28/2011   Procedure: COLONOSCOPY;  Surgeon: Rogene Houston, MD;  [facility]: [facility];  Service: Endoscopy;  Laterality: N/A;   COLONOSCOPY WITH PROPOFOL N/A 09/23/2021   Procedure: COLONOSCOPY WITH PROPOFOL;  Surgeon: Harvel Quale, MD;  Location: [facility];  Service: Gastroenterology;  Laterality: N/A;  940   ESOPHAGOGASTRODUODENOSCOPY (EGD) WITH PROPOFOL N/A 09/23/2021   Procedure: ESOPHAGOGASTRODUODENOSCOPY (EGD) WITH PROPOFOL;  Surgeon: Harvel Quale, MD;  [facility]: [facility];  Service: Gastroenterology;  Laterality: N/A;   IR IMAGING GUIDED PORT INSERTION  09/28/2021   IR PARACENTESIS  09/28/2021   JOINT REPLACEMENT Right    hip   LEFT HEART CATHETERIZATION WITH CORONARY ANGIOGRAM N/A 03/31/2013   Procedure: LEFT HEART CATHETERIZATION WITH CORONARY ANGIOGRAM;  Surgeon: Minus Breeding, MD;  Location: [facility] CATH LAB;  Service: Cardiovascular;  Laterality: N/A;   POLYPECTOMY  09/23/2021   Procedure: POLYPECTOMY;  Surgeon:  Montez Morita, Quillian Quince, MD;  Location: [facility];  Service: Gastroenterology;;   TURBT  09/11/2009    SOCIAL HISTORY:  Social History   Socioeconomic History   Marital status: Divorced    Spouse name: Not on file   Number of children: 0   Years of education: college   Highest education level: Not  on file  Occupational History    Employer: SELF-EMPLOYED  Tobacco Use   Smoking status: Former    Packs/day: 0.50    Years: 10.00    Total pack years: 5.00    Types: Cigarettes    Quit date: 07/10/2011    Years since quitting: 11.1   Smokeless tobacco: Never   Tobacco comments:    Quit several yrs prior to 03/2013.  Vaping Use   Vaping Use: Never used  Substance and Sexual Activity   Alcohol use: Yes    Alcohol/week: 0.0 standard drinks of alcohol    Comment: Occasional   Drug use: No   Sexual activity: Not on file  Other Topics Concern   Not on file  Social History Narrative   Not on file   Social Determinants of Health   Financial Resource Strain: Not on file  Food Insecurity: No Food Insecurity (06/05/2022)   Hunger Vital Sign    Worried About Running Out of Food in the Last Year: Never true    Ran Out of Food in the Last Year: Never true  Transportation Needs: No Transportation Needs (06/05/2022)   PRAPARE - Hydrologist (Medical): No    Lack of Transportation (Non-Medical): No  Physical Activity: Not on file  Stress: Not on file  Social Connections: Not on file  Intimate Partner Violence: Not At Risk (06/05/2022)   Humiliation, Afraid, Rape, and Kick questionnaire    Fear of Current or Ex-Partner: No    Emotionally Abused: No    Physically Abused: No    Sexually Abused: No    FAMILY HISTORY:  Family History  Problem Relation Age of Onset   COPD Father     CURRENT MEDICATIONS:  Current Outpatient Medications  Medication Sig Dispense Refill   amLODipine (NORVASC) 5 MG tablet Take 5 mg by mouth daily.     aspirin EC 81 MG tablet Take 81 mg by mouth daily.     bumetanide (BUMEX) 1 MG tablet Take 1 tablet (1 mg total) by mouth in the morning. 30 tablet 2   buPROPion (WELLBUTRIN XL) 300 MG 24 hr tablet Take 300 mg by mouth daily.     CISPLATIN IV Inject into the vein once a week. Day 1, Day 8 q 21 days Every other week.      cyclobenzaprine (FLEXERIL) 10 MG tablet Take 10 mg by mouth at bedtime as needed for muscle spasms.     Durvalumab (IMFINZI IV) Inject into the vein every 21 ( twenty-one) days.     Elastic Bandages & Supports (ELASTIC BANDAGE 6") MISC Use compression wraps during the day for leg swelling 4 each 0   Eszopiclone 3 MG TABS Take 3 mg by mouth at bedtime. Take immediately before bedtime     famotidine (PEPCID) 20 MG tablet TAKE (1) TABLET BY MOUTH AT BEDTIME. (Patient taking differently: Take 20 mg by mouth daily.) 30 tablet 0   fenofibrate 160 MG tablet Take 160 mg by mouth daily.     FLUoxetine (PROZAC) 10 MG capsule Take 10 mg by mouth daily.     furosemide (LASIX)  40 MG tablet Take 1 tablet (40 mg total) by mouth daily. 30 tablet 1   gabapentin (NEURONTIN) 300 MG capsule Take 1 capsule (300 mg total) by mouth 3 (three) times daily. 90 capsule 3   Gauze Pads & Dressings (ABDOMINAL PAD) 8"X10" PADS Use for leaking fluid from leg swelling 360 each 0   Gemcitabine HCl (GEMZAR IV) Inject into the vein once a week. Day 1, Day 8 q 21 days Every other week     magnesium oxide (MAG-OX) 400 (240 Mg) MG tablet Take 1 tablet (400 mg total) by mouth 2 (two) times daily. (Patient taking differently: Take 400 mg by mouth 3 (three) times daily.) 90 tablet 6   metFORMIN (GLUCOPHAGE-XR) 500 MG 24 hr tablet Take 500 mg by mouth 2 (two) times daily.     olmesartan (BENICAR) 40 MG tablet Take 1 tablet (40 mg total) by mouth daily. 30 tablet 6   omeprazole (PRILOSEC) 40 MG capsule Take 40 mg by mouth daily as needed (reflux).     pantoprazole (PROTONIX) 40 MG tablet Take 1 tablet (40 mg total) by mouth daily. (Patient taking differently: Take 40 mg by mouth daily as needed (reflux).) 90 tablet 3   pramipexole (MIRAPEX) 0.75 MG tablet Take 1 tablet (0.75 mg total) by mouth 3 (three) times daily. 90 tablet 0   RYBELSUS 7 MG TABS Take 14 mg by mouth daily.     tamsulosin (FLOMAX) 0.4 MG CAPS capsule Take 0.4 mg by mouth  daily.     traMADol (ULTRAM) 50 MG tablet Take 50 mg by mouth 4 (four) times daily as needed for moderate pain.     nitroGLYCERIN (NITROSTAT) 0.4 MG SL tablet Place 1 tablet (0.4 mg total) under the tongue every 5 (five) minutes as needed for chest pain. 25 tablet 3   No current facility-administered medications for this visit.   Facility-Administered Medications Ordered in Other Visits  Medication Dose Route Frequency Provider Last Rate Last Admin   magnesium sulfate 2 GM/50ML IVPB             ALLERGIES:  No Known Allergies  PHYSICAL EXAM:  Performance status (ECOG): 0 - Asymptomatic  There were no vitals filed for this visit. Wt Readings from Last 3 Encounters:  09/05/22 276 lb (125.2 kg)  08/09/22 276 lb 12.8 oz (125.6 kg)  07/12/22 269 lb 11.2 oz (122.3 kg)   Physical Exam Vitals reviewed.  Constitutional:      Appearance: Normal appearance.  Cardiovascular:     Rate and Rhythm: Normal rate and regular rhythm.     Pulses: Normal pulses.     Heart sounds: Normal heart sounds.  Pulmonary:     Effort: Pulmonary effort is normal.     Breath sounds: Normal breath sounds.  Neurological:     General: No focal deficit present.     Mental Status: He is alert and oriented to person, place, and time.  Psychiatric:        Mood and Affect: Mood normal.        Behavior: Behavior normal.     LABORATORY DATA:  I have reviewed the labs as listed.     Latest Ref Rng & Units 09/05/2022    9:45 AM 08/09/2022    8:00 AM 07/12/2022    8:10 AM  CBC  WBC 4.0 - 10.5 K/uL 5.0  4.6  5.1   Hemoglobin 13.0 - 17.0 g/dL 11.9  11.5  10.9   Hematocrit 39.0 - 52.0 %  36.5  34.3  33.5   Platelets 150 - 400 K/uL 216  215  200       Latest Ref Rng & Units 09/05/2022    9:45 AM 08/09/2022    8:00 AM 07/12/2022    8:10 AM  CMP  Glucose 70 - 99 mg/dL 131  114  128   BUN 8 - 23 mg/dL 35  33  50   Creatinine 0.61 - 1.24 mg/dL 1.60  1.32  1.70   Sodium 135 - 145 mmol/L 139  139  138    Potassium 3.5 - 5.1 mmol/L 4.0  4.0  3.9   Chloride 98 - 111 mmol/L 105  103  105   CO2 22 - 32 mmol/L _0 Calcium 8.9 - 10.3 mg/dL 9.0  9.4  9.0   Total Protein 6.5 - 8.1 g/dL 6.7  6.6  6.4   Total Bilirubin 0.3 - 1.2 mg/dL 0.4  0.3  0.5   Alkaline Phos 38 - 126 U/L 74  61  76   AST 15 - 41 U/L _1 ALT 0 - 44 U/L _2 DIAGNOSTIC IMAGING:  I have independently reviewed the scans and discussed with the patient. CT CHEST ABDOMEN PELVIS W CONTRAST  Result Date: 08/07/2022 CLINICAL DATA:  Gallbladder/biliary cancer monitor/restaging of cholangiocarcinoma post chemotherapy. * Tracking Code: BO *. EXAM: CT CHEST, ABDOMEN, AND PELVIS WITH CONTRAST TECHNIQUE: Multidetector CT imaging of the chest, abdomen and pelvis was performed following the standard protocol during bolus administration of intravenous contrast. RADIATION DOSE REDUCTION: This exam was performed according to the departmental dose-optimization program which includes automated exposure control, adjustment of the mA and/or kV according to patient size and/or use of iterative reconstruction technique. CONTRAST:  63m OMNIPAQUE IOHEXOL 300 MG/ML  SOLN COMPARISON:  Multiple priors including CT chest, abdomen and pelvis May 23, 2022. FINDINGS: CT CHEST FINDINGS Cardiovascular: Right chest wall Port-A-Cath with tip at the superior cavoatrial junction. Aortic atherosclerosis. No central pulmonary embolus on this nondedicated study. Normal size heart. No significant pericardial effusion/thickening. Mediastinum/Nodes: No suspicious thyroid nodule. No pathologically enlarged mediastinal, hilar or axillary lymph nodes. The esophagus is grossly unremarkable. Lungs/Pleura: No suspicious pulmonary nodules or masses. Scattered subpleural reticulations predominantly involving the anterior upper lobes commonly reflects sequela of prior infectious or inflammatory etiology. Mild diffuse bronchial wall thickening. No pleural  effusion. No pneumothorax. Musculoskeletal: No aggressive lytic or blastic lesion of bone. Multilevel degenerative changes spine. Degenerative changes bilateral shoulders. CT ABDOMEN PELVIS FINDINGS Hepatobiliary: Cholelithiasis. Similar gallbladder wall thickening with pericholecystic fluid in a nondistended gallbladder. Segment IVb hepatic lesion measures 1.3 x 1.0 cm on image 61/2 previously 1.8 x 1.0 cm no new suspicious hepatic lesion. No significant biliary ductal dilation. Pancreas: No pancreatic ductal dilation or evidence of acute inflammation. Spleen: No splenomegaly or focal splenic lesion. Adrenals/Urinary Tract: Bilateral adrenal glands appear normal. No hydronephrosis. Kidneys demonstrate symmetric enhancement and excretion of contrast. Urinary bladder is unremarkable for degree of distension. Stomach/Bowel: Radiopaque enteric contrast material traverses the ileocecal valve. Stomach is unremarkable for degree of distension. No pathologic dilation of small or large bowel. The appendix and terminal ileum appear normal. Colonic diverticulosis without findings of acute diverticulitis. Vascular/Lymphatic: Aortic atherosclerosis. No pathologically enlarged abdominal or pelvic lymph nodes. Stable mildly prominent iliac side chain and inguinal lymph nodes for instance unchanged size of a left external iliac lymph node measuring 9 mm  in short axis on image 117/2 and unchanged size of a right external iliac lymph node measures 7 mm in short axis on image 118/2 Reproductive: Prostate is unremarkable. Other: Fat containing left-greater-than-right inguinal hernias. Musculoskeletal: No aggressive lytic or blastic lesion of bone. Multilevel degenerative changes spine. Prior right hip arthroplasty. Unchanged lipoma between the left gluteal muscles. IMPRESSION: 1. Continued decrease in size of the hepatic segment IVb hepatic lesion. No new suspicious hepatic lesions. Underlying gallbladder wall thickening and  pericholecystic fluid appears similar, attributed to patient's cholangiocarcinoma. 2. No evidence of new or progressive disease in the chest, abdomen or pelvis. 3. Colonic diverticulosis without findings of acute diverticulitis. 4.  Aortic Atherosclerosis (ICD10-I70.0). Electronically Signed   By: Dahlia Bailiff M.D.   On: 08/07/2022 11:51     ASSESSMENT:  Liver lesion/peritoneal carcinomatosis: - Patient seen at the request of Dr. Delphina Cahill - Reported pressure on the sides of the abdomen with slight pain for the last 1 month.  He also reported pain in the epigastric region. - He had lost 20 pounds in the last 3 to 4 months intentionally, cutting back on sugars.  He was also started on semaglutide. - Colonoscopy on 06/28/2011 with a benign polypoid colonic mucosa in the descending colon.  No evidence of malignancy. - MRI of the brain was negative. - EGD and colonoscopy on 09/23/2018 did not reveal any malignancies. 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  IHC positive for CK7, CDX2 and negative for GATA3.  Findings suggestive of upper GI or pancreaticobiliary primary. - Cycle 1 of gemcitabine, cisplatin and durvalumab started on 10/05/2021. - NGS: No targetable mutations.  PD-L1 (XB847) negative.  MSI-stable.  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   Social/family history: - He lives at home by himself.  He records voiceover/narrations. - Non-smoker. - Father died of MDS.  Paternal grandfather had prostate cancer.  Maternal grandfather had cancer.  3.  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 Alinda Money.  Reportedly received 1 treatment of intravesical chemo and has been on surveillance since then.  Last surveillance visit was in 2020.   PLAN:  Cholangiocarcinoma with peritoneal carcinomatosis: - CT CAP on 08/07/2022 with continued decrease in size of the hepatic lesion with no new liver lesions with no evidence of  progression. - Last CEA was 3.0. - Labs today shows normal LFTs.  Creatinine is elevated at 1.6.  CBC was grossly normal. - Recommend proceeding with Durvalumab today.  He will receive final mL of normal saline for elevated creatinine.  RTC 4 weeks with labs and treatment.  Will plan to repeat scans after next cycle.   Hypertension: - Continue Benicar half tablet daily.  Blood pressure today is 113/52. - He is taking Bumex every other day.  Change Bumex to 1 mg as needed.  3.  Neuropathy/"drawing" of legs: - Continue Mirapex 0.75 mg 3 times daily.  4.  Sleeping difficulty: - Continue Lunesta as needed.  5.  Hypomagnesemia: - Continue magnesium 3 times daily.  Magnesium is normal.   Orders placed this encounter:  Orders Placed This Encounter  Procedures   CBC with Differential   Comprehensive metabolic panel   Comprehensive metabolic panel      Derek Jack, MD [place] [phone]

## 2022-09-05 NOTE — Patient Instructions (Signed)
[facility]  Discharge Instructions: Thank you for choosing [facility] to provide your oncology and hematology care.  If you have a lab appointment with the [facility], please come in thru the Main Entrance and check in at the main information desk.  Wear comfortable clothing and clothing appropriate for easy access to any Portacath or PICC line.   We strive to give you quality time with your provider. You may need to reschedule your appointment if you arrive late (15 or more minutes).  Arriving late affects you and other patients whose appointments are after yours.  Also, if you miss three or more appointments without notifying the office, you may be dismissed from the clinic at the provider's discretion.      For prescription refill requests, have your pharmacy contact our office and allow 72 hours for refills to be completed.    Today you received the following chemotherapy and/or immunotherapy agents Imfinzi and 500 mL of normal saline over 1 hour.   To help prevent nausea and vomiting after your treatment, we encourage you to take your nausea medication as directed.  Durvalumab Injection What is this medication? DURVALUMAB (dur VAL ue mab) treats some types of cancer. It works by helping your immune system slow or stop the spread of cancer cells. It is a monoclonal antibody. This medicine may be used for other purposes; ask your health care provider or pharmacist if you have questions. COMMON BRAND NAME(S): IMFINZI What should I tell my care team before I take this medication? They need to know if you have any of these conditions: Allogeneic stem cell transplant (uses someone else's stem cells) Autoimmune diseases, such as Crohn disease, ulcerative colitis, lupus History of chest radiation Nervous system problems, such as Guillain-Barre syndrome, myasthenia gravis Organ transplant An unusual or allergic reaction to durvalumab, other medications,  foods, dyes, or preservatives Pregnant or trying to get pregnant Breast-feeding How should I use this medication? This medication is infused into a vein. It is given by your care team in a hospital or clinic setting. A special MedGuide will be given to you before each treatment. Be sure to read this information carefully each time. Talk to your care team about the use of this medication in children. Special care may be needed. Overdosage: If you think you have taken too much of this medicine contact a poison control center or emergency room at once. NOTE: This medicine is only for you. Do not share this medicine with others. What if I miss a dose? Keep appointments for follow-up doses. It is important not to miss your dose. Call your care team if you are unable to keep an appointment. What may interact with this medication? Interactions have not been studied. This list may not describe all possible interactions. Give your health care provider a list of all the medicines, herbs, non-prescription drugs, or dietary supplements you use. Also tell them if you smoke, drink alcohol, or use illegal drugs. Some items may interact with your medicine. What should I watch for while using this medication? Your condition will be monitored carefully while you are receiving this medication. You may need blood work while taking this medication. This medication may cause serious skin reactions. They can happen weeks to months after starting the medication. Contact your care team right away if you notice fevers or flu-like symptoms with a rash. The rash may be red or purple and then turn into blisters or peeling of the  skin. You may also notice a red rash with swelling of the face, lips, or lymph nodes in your neck or under your arms. Tell your care team right away if you have any change in your eyesight. Talk to your care team if you may be pregnant. Serious birth defects can occur if you take this medication during  pregnancy and for 3 months after the last dose. You will need a negative pregnancy test before starting this medication. Contraception is recommended while taking this medication and for 3 months after the last dose. Your care team can help you find the option that works for you. Do not breastfeed while taking this medication and for 3 months after the last dose. What side effects may I notice from receiving this medication? Side effects that you should report to your care team as soon as possible: Allergic reactions--skin rash, itching, hives, swelling of the face, lips, tongue, or throat Dry cough, shortness of breath or trouble breathing Eye pain, redness, irritation, or discharge with blurry or decreased vision Heart muscle inflammation--unusual weakness or fatigue, shortness of breath, chest pain, fast or irregular heartbeat, dizziness, swelling of the ankles, feet, or hands Hormone gland problems--headache, sensitivity to light, unusual weakness or fatigue, dizziness, fast or irregular heartbeat, increased sensitivity to cold or heat, excessive sweating, constipation, hair loss, increased thirst or amount of urine, tremors or shaking, irritability Infusion reactions--chest pain, shortness of breath or trouble breathing, feeling faint or lightheaded Kidney injury (glomerulonephritis)--decrease in the amount of urine, red or dark brown urine, foamy or bubbly urine, swelling of the ankles, hands, or feet Liver injury--right upper belly pain, loss of appetite, nausea, light-colored stool, dark yellow or brown urine, yellowing skin or eyes, unusual weakness or fatigue Pain, tingling, or numbness in the hands or feet, muscle weakness, change in vision, confusion or trouble speaking, loss of balance or coordination, trouble walking, seizures Rash, fever, and swollen lymph nodes Redness, blistering, peeling, or loosening of the skin, including inside the mouth Sudden or severe stomach pain, bloody  diarrhea, fever, nausea, vomiting Side effects that usually do not require medical attention (report these to your care team if they continue or are bothersome): Bone, joint, or muscle pain Diarrhea Fatigue Loss of appetite Nausea Skin rash This list may not describe all possible side effects. Call your doctor for medical advice about side effects. You may report side effects to FDA at 1-800-FDA-1088. Where should I keep my medication? This medication is given in a hospital or clinic. It will not be stored at home. NOTE: This sheet is a summary. It may not cover all possible information. If you have questions about this medicine, talk to your doctor, pharmacist, or health care provider.  2023 Elsevier/Gold Standard (2021-12-19 00:00:00)   BELOW ARE SYMPTOMS THAT SHOULD BE REPORTED IMMEDIATELY: *FEVER GREATER THAN 100.4 F (38 C) OR HIGHER *CHILLS OR SWEATING *NAUSEA AND VOMITING THAT IS NOT CONTROLLED WITH YOUR NAUSEA MEDICATION *UNUSUAL SHORTNESS OF BREATH *UNUSUAL BRUISING OR BLEEDING *URINARY PROBLEMS (pain or burning when urinating, or frequent urination) *BOWEL PROBLEMS (unusual diarrhea, constipation, pain near the anus) TENDERNESS IN MOUTH AND THROAT WITH OR WITHOUT PRESENCE OF ULCERS (sore throat, sores in mouth, or a toothache) UNUSUAL RASH, SWELLING OR PAIN  UNUSUAL VAGINAL DISCHARGE OR ITCHING   Items with * indicate a potential emergency and should be followed up as soon as possible or go to the Emergency Department if any problems should occur.  Please show the CHEMOTHERAPY ALERT CARD or IMMUNOTHERAPY  ALERT CARD at check-in to the Emergency Department and triage nurse.  Should you have questions after your visit or need to cancel or reschedule your appointment, please contact [facility] [phone]  and follow the prompts.  Office hours are 8:00 a.m. to 4:30 p.m. Monday - Friday. Please note that voicemails left after 4:00 p.m. may not be returned  until the following business day.  We are closed weekends and major holidays. You have access to a nurse at all times for urgent questions. Please call the main number to the clinic [phone] and follow the prompts.  For any non-urgent questions, you may also contact your provider using MyChart. We now offer e-Visits for anyone 68 and older to request care online for non-urgent symptoms. For details visit [email or web address].   Also download the MyChart app! Go to the app store, search "MyChart", open the app, select , and log in with your MyChart username and password.  Masks are optional in the cancer centers. If you would like for your care team to wear a mask while they are taking care of you, please let them know. You may have one support person who is at least 67 years old accompany you for your appointments.

## 2022-09-05 NOTE — Patient Instructions (Addendum)
[facility]  Discharge Instructions  You were seen and examined today by Dr. Delton Coombes.  You will proceed with treatment today as planned.  Only take Bumex as needed. Continue Benicar as you have been. Please try to track your BP at home - if your BP is less than 110 on top, hold Benicar.  You will receive 1/2 a Liter of Fluids today due to a rise in your kidney functions.  Follow-up as scheduled.  Thank you for choosing [facility] to provide your oncology and hematology care.   To afford each patient quality time with our provider, please arrive at least 15 minutes before your scheduled appointment time. You may need to reschedule your appointment if you arrive late (10 or more minutes). Arriving late affects you and other patients whose appointments are after yours.  Also, if you miss three or more appointments without notifying the office, you may be dismissed from the clinic at the provider's discretion.    Again, thank you for choosing [facility].  Our hope is that these requests will decrease the amount of time that you wait before being seen by our physicians.   If you have a lab appointment with the [facility] please come in thru the Main Entrance and check in at the main information desk.           _____________________________________________________________  Should you have questions after your visit to [facility], please contact our office at [phone] and follow the prompts.  Our office hours are 8:00 a.m. to 4:30 p.m. Monday - Thursday and 8:00 a.m. to 2:30 p.m. Friday.  Please note that voicemails left after 4:00 p.m. may not be returned until the following business day.  We are closed weekends and all major holidays.  You do have access to a nurse 24-7, just call the main number to the clinic [phone] and do not press any options, hold on the line and a nurse will answer the phone.     For prescription refill requests, have your pharmacy contact our office and allow 72 hours.    Masks are optional in the cancer centers. If you would like for your care team to wear a mask while they are taking care of you, please let them know. You may have one support person who is at least 67 years old accompany you for your appointments.

## 2022-09-05 NOTE — Progress Notes (Signed)
Patient has been assessed, vital signs and labs have been reviewed by Dr. Delton Coombes. ANC, Creatinine, LFTs, and Platelets are within treatment parameters per Dr. Delton Coombes. The patient is good to proceed with treatment at this time. Patient to receive 522m NS today due to Creatinine. Primary RN and pharmacy aware.

## 2022-09-05 NOTE — Progress Notes (Signed)
Pt presents today for Imfinzi per provider's order. Vital signs and other labs WNL for treatment. Pt will receive 500 mL normal saline over 1 hour due to increased creatinine of 1.60 today. Okay to proceed with treatment today per Dr.K.  Imfinzi and 500 mL of normal saline over 1 hour given today per MD orders. Tolerated infusion without adverse affects. Vital signs stable. No complaints at this time. Discharged from clinic ambulatory in stable condition. Alert and oriented x 3. F/U with [facility] as scheduled.

## 2022-09-06 LAB — CANCER ANTIGEN 19-9: CA 19-9: 9 U/mL (ref 0–35)

## 2022-09-06 LAB — CEA: CEA: 2.6 ng/mL (ref 0.0–4.7)

## 2022-09-11 ENCOUNTER — Other Ambulatory Visit: Payer: Self-pay | Admitting: Physician Assistant

## 2022-09-11 DIAGNOSIS — K30 Functional dyspepsia: Secondary | ICD-10-CM

## 2022-09-15 ENCOUNTER — Other Ambulatory Visit: Payer: Self-pay

## 2022-09-22 ENCOUNTER — Other Ambulatory Visit: Payer: Self-pay

## 2022-09-28 ENCOUNTER — Ambulatory Visit (INDEPENDENT_AMBULATORY_CARE_PROVIDER_SITE_OTHER): Payer: Medicare Other | Admitting: Gastroenterology

## 2022-10-01 ENCOUNTER — Other Ambulatory Visit: Payer: Self-pay

## 2022-10-02 ENCOUNTER — Other Ambulatory Visit: Payer: Self-pay

## 2022-10-03 ENCOUNTER — Inpatient Hospital Stay (HOSPITAL_BASED_OUTPATIENT_CLINIC_OR_DEPARTMENT_OTHER): Payer: Medicare Other | Admitting: Hematology

## 2022-10-03 ENCOUNTER — Encounter: Payer: Self-pay | Admitting: Hematology

## 2022-10-03 ENCOUNTER — Inpatient Hospital Stay: Payer: Medicare Other

## 2022-10-03 ENCOUNTER — Inpatient Hospital Stay: Payer: Medicare Other | Attending: Hematology

## 2022-10-03 VITALS — BP 129/68 | HR 66 | Temp 97.3°F | Resp 20

## 2022-10-03 DIAGNOSIS — C787 Secondary malignant neoplasm of liver and intrahepatic bile duct: Secondary | ICD-10-CM | POA: Diagnosis not present

## 2022-10-03 DIAGNOSIS — C221 Intrahepatic bile duct carcinoma: Secondary | ICD-10-CM

## 2022-10-03 DIAGNOSIS — G479 Sleep disorder, unspecified: Secondary | ICD-10-CM | POA: Insufficient documentation

## 2022-10-03 DIAGNOSIS — M7989 Other specified soft tissue disorders: Secondary | ICD-10-CM | POA: Diagnosis not present

## 2022-10-03 DIAGNOSIS — Z95828 Presence of other vascular implants and grafts: Secondary | ICD-10-CM | POA: Diagnosis not present

## 2022-10-03 DIAGNOSIS — R0602 Shortness of breath: Secondary | ICD-10-CM | POA: Diagnosis not present

## 2022-10-03 DIAGNOSIS — G629 Polyneuropathy, unspecified: Secondary | ICD-10-CM | POA: Diagnosis not present

## 2022-10-03 DIAGNOSIS — Z79899 Other long term (current) drug therapy: Secondary | ICD-10-CM | POA: Diagnosis not present

## 2022-10-03 DIAGNOSIS — I10 Essential (primary) hypertension: Secondary | ICD-10-CM | POA: Diagnosis not present

## 2022-10-03 DIAGNOSIS — Z5112 Encounter for antineoplastic immunotherapy: Secondary | ICD-10-CM | POA: Diagnosis not present

## 2022-10-03 DIAGNOSIS — Z8551 Personal history of malignant neoplasm of bladder: Secondary | ICD-10-CM | POA: Diagnosis not present

## 2022-10-03 DIAGNOSIS — Z87891 Personal history of nicotine dependence: Secondary | ICD-10-CM | POA: Insufficient documentation

## 2022-10-03 LAB — CBC WITH DIFFERENTIAL/PLATELET
Abs Immature Granulocytes: 0.02 10*3/uL (ref 0.00–0.07)
Basophils Absolute: 0 10*3/uL (ref 0.0–0.1)
Basophils Relative: 1 %
Eosinophils Absolute: 0.2 10*3/uL (ref 0.0–0.5)
Eosinophils Relative: 4 %
HCT: 34.2 % — ABNORMAL LOW (ref 39.0–52.0)
Hemoglobin: 11.5 g/dL — ABNORMAL LOW (ref 13.0–17.0)
Immature Granulocytes: 0 %
Lymphocytes Relative: 24 %
Lymphs Abs: 1.2 10*3/uL (ref 0.7–4.0)
MCH: 31.5 pg (ref 26.0–34.0)
MCHC: 33.6 g/dL (ref 30.0–36.0)
MCV: 93.7 fL (ref 80.0–100.0)
Monocytes Absolute: 0.6 10*3/uL (ref 0.1–1.0)
Monocytes Relative: 13 %
Neutro Abs: 3 10*3/uL (ref 1.7–7.7)
Neutrophils Relative %: 58 %
Platelets: 210 10*3/uL (ref 150–400)
RBC: 3.65 MIL/uL — ABNORMAL LOW (ref 4.22–5.81)
RDW: 13 % (ref 11.5–15.5)
WBC: 5.1 10*3/uL (ref 4.0–10.5)
nRBC: 0 % (ref 0.0–0.2)

## 2022-10-03 LAB — COMPREHENSIVE METABOLIC PANEL
ALT: 21 U/L (ref 0–44)
AST: 26 U/L (ref 15–41)
Albumin: 3.6 g/dL (ref 3.5–5.0)
Alkaline Phosphatase: 69 U/L (ref 38–126)
Anion gap: 11 (ref 5–15)
BUN: 36 mg/dL — ABNORMAL HIGH (ref 8–23)
CO2: 24 mmol/L (ref 22–32)
Calcium: 9 mg/dL (ref 8.9–10.3)
Chloride: 102 mmol/L (ref 98–111)
Creatinine, Ser: 1.4 mg/dL — ABNORMAL HIGH (ref 0.61–1.24)
GFR, Estimated: 55 mL/min — ABNORMAL LOW (ref >60)
Glucose, Bld: 135 mg/dL — ABNORMAL HIGH (ref 70–99)
Potassium: 4.1 mmol/L (ref 3.5–5.1)
Sodium: 137 mmol/L (ref 135–145)
Total Bilirubin: 0.2 mg/dL — ABNORMAL LOW (ref 0.3–1.2)
Total Protein: 6.5 g/dL (ref 6.5–8.1)

## 2022-10-03 LAB — MAGNESIUM: Magnesium: 1.9 mg/dL (ref 1.7–2.4)

## 2022-10-03 MED ORDER — SODIUM CHLORIDE 0.9 % IV SOLN: Freq: Once | INTRAVENOUS | Status: AC

## 2022-10-03 MED ORDER — HEPARIN SOD (PORK) LOCK FLUSH 100 UNIT/ML IV SOLN
500.0000 [IU] | Freq: Once | INTRAVENOUS | Status: AC | PRN
  Administered 2022-10-03: 500 [IU]

## 2022-10-03 MED ORDER — SODIUM CHLORIDE 0.9% FLUSH
10.0000 mL | INTRAVENOUS | Status: DC | PRN
  Administered 2022-10-03: 10 mL via INTRAVENOUS

## 2022-10-03 MED ORDER — SODIUM CHLORIDE 0.9% FLUSH
10.0000 mL | INTRAVENOUS | Status: DC | PRN
  Administered 2022-10-03: 10 mL

## 2022-10-03 MED ORDER — SODIUM CHLORIDE 0.9 % IV SOLN
1500.0000 mg | Freq: Once | INTRAVENOUS | Status: AC
  Administered 2022-10-03: 1500 mg via INTRAVENOUS
  Filled 2022-10-03: qty 30

## 2022-10-03 NOTE — Patient Instructions (Addendum)
[facility] at [facility] Discharge Instructions   You were seen and examined today by Dr. Delton Coombes.  He reviewed the results of your lab work which are normal/stable.   We will proceed with your treatment today (Imfinzi only).  We will repeat a CT scan prior to your next treatment.   Return as scheduled.      Thank you for choosing [facility] to provide your oncology and hematology care.  To afford each patient quality time with our provider, please arrive at least 15 minutes before your scheduled appointment time.   If you have a lab appointment with the [facility] please come in thru the Main Entrance and check in at the main information desk.  You need to re-schedule your appointment should you arrive 10 or more minutes late.  We strive to give you quality time with our providers, and arriving late affects you and other patients whose appointments are after yours.  Also, if you no show three or more times for appointments you may be dismissed from the clinic at the providers discretion.     Again, thank you for choosing [facility].  Our hope is that these requests will decrease the amount of time that you wait before being seen by our physicians.       _____________________________________________________________  Should you have questions after your visit to [facility], please contact our office at [phone] and follow the prompts.  Our office hours are 8:00 a.m. and 4:30 p.m. Monday - Friday.  Please note that voicemails left after 4:00 p.m. may not be returned until the following business day.  We are closed weekends and major holidays.  You do have access to a nurse 24-7, just call the main number to the clinic [phone] and do not press any options, hold on the line and a nurse will answer the phone.    For prescription refill requests, have your pharmacy contact our office and  allow 72 hours.    Due to Covid, you will need to wear a mask upon entering the hospital. If you do not have a mask, a mask will be given to you at the Main Entrance upon arrival. For doctor visits, patients may have 1 support person age 74 or older with them. For treatment visits, patients can not have anyone with them due to social distancing guidelines and our immunocompromised population.

## 2022-10-03 NOTE — Progress Notes (Signed)
Patients port flushed without difficulty.  Good blood return noted with no bruising or swelling noted at site.  Patient remains accessed for chemotherapy treatment.  

## 2022-10-03 NOTE — Progress Notes (Signed)
Pt presents today for Imfinzi per provider's order. Vital signs and labs WNL for treatment. Okay to proceed with treatment today per Dr.K.  Imfinzi given today per MD orders. Tolerated infusion without adverse affects. Vital signs stable. No complaints at this time. Discharged from clinic ambulatory in stable condition. Alert and oriented x 3. F/U with [facility] as scheduled.

## 2022-10-03 NOTE — Progress Notes (Signed)
[facility] [address] [place] [address]   CLINIC:  Medical Oncology/Hematology  PCP:  Marvin Squibb, MD [address] 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  PRIOR THERAPY: none  NGS Results: No targetable mutations.  PD-L1 (HU765) negative.  MSI-stable.  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, a 67 y.o. male, seen for follow-up of cholangiocarcinoma and toxicity assessment prior to next cycle of immunotherapy.  He reports that his leg swellings have not improved.  He reports feet feeling cold all the time and numb.  He is taking Bumex 1 mg tablet daily.  REVIEW OF SYSTEMS:  Review of Systems  Constitutional:  Negative for unexpected weight change.  Respiratory:  Positive for shortness of breath.   Cardiovascular:  Positive for leg swelling.  Neurological:  Positive for numbness (Feet feel cold and numb with occasional bee sting).  Psychiatric/Behavioral:  Positive for sleep disturbance.   All other systems reviewed and are negative.   PAST MEDICAL/SURGICAL HISTORY:  Past Medical History:  Diagnosis Date   Anxiety    Bladder cancer  ([facility]) 09/11/2009   CAD (coronary artery disease), native coronary artery    a. Mildly elevated troponin 03/2013, cath with nonobstructive disease including 50% AV groove distal stenosis before large OM   Essential hypertension    Headache(784.0)    History of migraines   Hyperglycemia    Mixed hyperlipidemia    Neuropathy    Obesity    Port-A-Cath in place 09/30/2021   Pre-diabetes    Seasonal allergies    Sleep apnea    On CPAP   Past Surgical History:  Procedure Laterality Date   BIOPSY  09/23/2021   Procedure: BIOPSY;  Surgeon: Harvel Quale, MD;  [facility];  Service: Gastroenterology;;   COLONOSCOPY  06/28/2011   Procedure: COLONOSCOPY;  Surgeon: Rogene Houston, MD;  [facility];  Service: Endoscopy;  Laterality: N/A;   COLONOSCOPY WITH PROPOFOL N/A 09/23/2021   Procedure: COLONOSCOPY WITH PROPOFOL;  Surgeon: Harvel Quale, MD;  [facility];  Service: Gastroenterology;  Laterality: N/A;  940   ESOPHAGOGASTRODUODENOSCOPY (EGD) WITH PROPOFOL N/A 09/23/2021   Procedure: ESOPHAGOGASTRODUODENOSCOPY (EGD) WITH PROPOFOL;  Surgeon: Harvel Quale, MD;  [facility];  Service: Gastroenterology;  Laterality: N/A;   IR IMAGING GUIDED PORT INSERTION  09/28/2021   IR PARACENTESIS  09/28/2021   JOINT REPLACEMENT Right    hip   LEFT HEART CATHETERIZATION WITH CORONARY ANGIOGRAM N/A 03/31/2013   Procedure: LEFT HEART CATHETERIZATION WITH CORONARY ANGIOGRAM;  Surgeon: Minus Breeding, MD;  Location: [facility] CATH LAB;  Service: Cardiovascular;  Laterality: N/A;   POLYPECTOMY  09/23/2021  Procedure: POLYPECTOMY;  Surgeon: Montez Morita, Quillian Quince, MD;  [facility];  Service: Gastroenterology;;   TURBT  09/11/2009    SOCIAL HISTORY:  Social History   Socioeconomic History   Marital status: Divorced    Spouse name: Not on file   Number of children: 0   Years of education: college   Highest education  level: Not on file  Occupational History    Employer: SELF-EMPLOYED  Tobacco Use   Smoking status: Former    Packs/day: 0.50    Years: 10.00    Total pack years: 5.00    Types: Cigarettes    Quit date: 07/10/2011    Years since quitting: 11.2   Smokeless tobacco: Never   Tobacco comments:    Quit several yrs prior to 03/2013.  Vaping Use   Vaping Use: Never used  Substance and Sexual Activity   Alcohol use: Yes    Alcohol/week: 0.0 standard drinks of alcohol    Comment: Occasional   Drug use: No   Sexual activity: Not on file  Other Topics Concern   Not on file  Social History Narrative   Not on file   Social Determinants of Health   Financial Resource Strain: Not on file  Food Insecurity: No Food Insecurity (06/05/2022)   Hunger Vital Sign    Worried About Running Out of Food in the Last Year: Never true    Ran Out of Food in the Last Year: Never true  Transportation Needs: No Transportation Needs (06/05/2022)   PRAPARE - Hydrologist (Medical): No    Lack of Transportation (Non-Medical): No  Physical Activity: Not on file  Stress: Not on file  Social Connections: Not on file  Intimate Partner Violence: Not At Risk (06/05/2022)   Humiliation, Afraid, Rape, and Kick questionnaire    Fear of Current or Ex-Partner: No    Emotionally Abused: No    Physically Abused: No    Sexually Abused: No    FAMILY HISTORY:  Family History  Problem Relation Age of Onset   COPD Father     CURRENT MEDICATIONS:  Current Outpatient Medications  Medication Sig Dispense Refill   amLODipine (NORVASC) 5 MG tablet Take 5 mg by mouth daily.     aspirin EC 81 MG tablet Take 81 mg by mouth daily.     bumetanide (BUMEX) 1 MG tablet Take 1 tablet (1 mg total) by mouth in the morning. 30 tablet 2   buPROPion (WELLBUTRIN XL) 300 MG 24 hr tablet Take 300 mg by mouth daily.     cyclobenzaprine (FLEXERIL) 10 MG tablet Take 10 mg by mouth at bedtime as needed for  muscle spasms.     Durvalumab (IMFINZI IV) Inject into the vein every 21 ( twenty-one) days.     Elastic Bandages & Supports (ELASTIC BANDAGE 6") MISC Use compression wraps during the day for leg swelling 4 each 0   Eszopiclone 3 MG TABS Take 3 mg by mouth at bedtime. Take immediately before bedtime     famotidine (PEPCID) 20 MG tablet TAKE (1) TABLET BY MOUTH AT BEDTIME. 30 tablet 0   fenofibrate 160 MG tablet Take 160 mg by mouth daily.     FLUoxetine (PROZAC) 10 MG capsule Take 10 mg by mouth daily.     furosemide (LASIX) 40 MG tablet Take 1 tablet (40 mg total) by mouth daily. 30 tablet 1   gabapentin (NEURONTIN) 300 MG capsule Take 1 capsule (300 mg total)  by mouth 3 (three) times daily. 90 capsule 3   Gauze Pads & Dressings (ABDOMINAL PAD) 8"X10" PADS Use for leaking fluid from leg swelling 360 each 0   magnesium oxide (MAG-OX) 400 (240 Mg) MG tablet Take 1 tablet (400 mg total) by mouth 2 (two) times daily. (Patient taking differently: Take 400 mg by mouth 3 (three) times daily.) 90 tablet 6   metFORMIN (GLUCOPHAGE-XR) 500 MG 24 hr tablet Take 500 mg by mouth 2 (two) times daily.     nitroGLYCERIN (NITROSTAT) 0.4 MG SL tablet Place 1 tablet (0.4 mg total) under the tongue every 5 (five) minutes as needed for chest pain. 25 tablet 3   olmesartan (BENICAR) 40 MG tablet Take 1 tablet (40 mg total) by mouth daily. 30 tablet 6   omeprazole (PRILOSEC) 40 MG capsule Take 40 mg by mouth daily as needed (reflux).     pantoprazole (PROTONIX) 40 MG tablet Take 1 tablet (40 mg total) by mouth daily. (Patient taking differently: Take 40 mg by mouth daily as needed (reflux).) 90 tablet 3   pramipexole (MIRAPEX) 0.75 MG tablet Take 1 tablet (0.75 mg total) by mouth 3 (three) times daily. 90 tablet 0   RYBELSUS 7 MG TABS Take 14 mg by mouth daily.     tamsulosin (FLOMAX) 0.4 MG CAPS capsule Take 0.4 mg by mouth daily.     traMADol (ULTRAM) 50 MG tablet Take 50 mg by mouth 4 (four) times daily as needed for  moderate pain.     zaleplon (SONATA) 10 MG capsule Take 10 mg by mouth at bedtime as needed.     CISPLATIN IV Inject into the vein once a week. Day 1, Day 8 q 21 days Every other week. (Patient not taking: Reported on 10/03/2022)     Gemcitabine HCl (GEMZAR IV) Inject into the vein once a week. Day 1, Day 8 q 21 days Every other week (Patient not taking: Reported on 10/03/2022)     No current facility-administered medications for this visit.   Facility-Administered Medications Ordered in Other Visits  Medication Dose Route Frequency Provider Last Rate Last Admin   magnesium sulfate 2 GM/50ML IVPB            sodium chloride flush (NS) 0.9 % injection 10 mL  10 mL Intravenous PRN Derek Jack, MD   10 mL at 10/03/22 0940    ALLERGIES:  No Known Allergies  PHYSICAL EXAM:  Performance status (ECOG): 0 - Asymptomatic  There were no vitals filed for this visit. Wt Readings from Last 3 Encounters:  10/03/22 290 lb 6.4 oz (131.7 kg)  09/05/22 276 lb (125.2 kg)  08/09/22 276 lb 12.8 oz (125.6 kg)   Physical Exam Vitals reviewed.  Constitutional:      Appearance: Normal appearance.  Cardiovascular:     Rate and Rhythm: Normal rate and regular rhythm.     Pulses: Normal pulses.     Heart sounds: Normal heart sounds.  Pulmonary:     Effort: Pulmonary effort is normal.     Breath sounds: Normal breath sounds.  Neurological:     General: No focal deficit present.     Mental Status: He is alert and oriented to person, place, and time.  Psychiatric:        Mood and Affect: Mood normal.        Behavior: Behavior normal.     LABORATORY DATA:  I have reviewed the labs as listed.     Latest Ref Rng &  Units 10/03/2022    9:40 AM 09/05/2022    9:45 AM 08/09/2022    8:00 AM  CBC  WBC 4.0 - 10.5 K/uL 5.1  5.0  4.6   Hemoglobin 13.0 - 17.0 g/dL 11.5  11.9  11.5   Hematocrit 39.0 - 52.0 % 34.2  36.5  34.3   Platelets 150 - 400 K/uL 210  216  215       Latest Ref Rng & Units  10/03/2022    9:40 AM 09/05/2022    9:45 AM 08/09/2022    8:00 AM  CMP  Glucose 70 - 99 mg/dL 135  131  114   BUN 8 - 23 mg/dL 36  35  33   Creatinine 0.61 - 1.24 mg/dL 1.40  1.60  1.32   Sodium 135 - 145 mmol/L 137  139  139   Potassium 3.5 - 5.1 mmol/L 4.1  4.0  4.0   Chloride 98 - 111 mmol/L 102  105  103   CO2 22 - 32 mmol/L '24  27  26   '$ Calcium 8.9 - 10.3 mg/dL 9.0  9.0  9.4   Total Protein 6.5 - 8.1 g/dL 6.5  6.7  6.6   Total Bilirubin 0.3 - 1.2 mg/dL 0.2  0.4  0.3   Alkaline Phos 38 - 126 U/L 69  74  61   AST 15 - 41 U/L '26  25  20   '$ ALT 0 - 44 U/L '21  24  19     '$ DIAGNOSTIC IMAGING:  I have independently reviewed the scans and discussed with the patient. No results found.   ASSESSMENT:  Liver lesion/peritoneal carcinomatosis: - Patient seen at the request of Dr. Delphina Cahill - Reported pressure on the sides of the abdomen with slight pain for the last 1 month.  He also reported pain in the epigastric region. - He had lost 20 pounds in the last 3 to 4 months intentionally, cutting back on sugars.  He was also started on semaglutide. - Colonoscopy on 06/28/2011 with a benign polypoid colonic mucosa in the descending colon.  No evidence of malignancy. - MRI of the brain was negative. - EGD and colonoscopy on 09/23/2018 did not reveal any malignancies. 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  IHC positive for CK7, CDX2 and negative for GATA3.  Findings suggestive of upper GI or pancreaticobiliary primary. - Cycle 1 of gemcitabine, cisplatin and durvalumab started on 10/05/2021. - NGS: No targetable mutations.  PD-L1 (GX211) negative.  MSI-stable.  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   Social/family history: - He lives at home by himself.  He records voiceover/narrations. - Non-smoker. - Father died of MDS.  Paternal grandfather had prostate cancer.  Maternal grandfather had cancer.  3.  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 Alinda Money.  Reportedly received 1 treatment of intravesical chemo and has been on surveillance since then.  Last surveillance visit was in 2020.   PLAN:  Cholangiocarcinoma with peritoneal carcinomatosis: - CT CAP on 08/07/2022 with continued decrease in size of the hepatic lesion with no new liver lesions and evidence of progression. - Reviewed labs today which showed normal LFTs.  Creatinine is 1.4 and at baseline.  CBC was normal.  Last TSH is 2.4.  CEA and CA 19-9 were normal. - Proceed with Imfinzi today.  RTC 4 EXTR follow-up.  Will plan to repeat scans in 4 months.   Hypertension: - Continue Benicar half  tablet daily. - I have told him to increase Bumex to 2 mg daily as needed.  3.  Neuropathy/"drawing" of legs: - Continue Mirapex 0.75 mg 3 times daily.  4.  Sleeping difficulty: - Continue Lunesta as needed..  5.  Hypomagnesemia: - Continue magnesium 3 times daily.  Magnesium is normal.   Orders placed this encounter:  Orders Placed This Encounter  Procedures   CT CHEST [place], [place] [phone]

## 2022-10-03 NOTE — Progress Notes (Signed)
Patient has been examined by Dr. Delton Coombes, and vital signs and labs have been reviewed. ANC, Creatinine, LFTs, hemoglobin, and platelets are within treatment parameters per M.D. - pt may proceed with treatment (Imfinzi only).  Primary RN and pharmacy notified.

## 2022-10-03 NOTE — Patient Instructions (Signed)
[facility]  Discharge Instructions: Thank you for choosing [facility] to provide your oncology and hematology care.  If you have a lab appointment with the [facility], please come in thru the Main Entrance and check in at the main information desk.  Wear comfortable clothing and clothing appropriate for easy access to any Portacath or PICC line.   We strive to give you quality time with your provider. You may need to reschedule your appointment if you arrive late (15 or more minutes).  Arriving late affects you and other patients whose appointments are after yours.  Also, if you miss three or more appointments without notifying the office, you may be dismissed from the clinic at the provider's discretion.      For prescription refill requests, have your pharmacy contact our office and allow 72 hours for refills to be completed.    Today you received the following chemotherapy and/or immunotherapy agents Imfinzi   To help prevent nausea and vomiting after your treatment, we encourage you to take your nausea medication as directed.  Durvalumab Injection What is this medication? DURVALUMAB (dur VAL ue mab) treats some types of cancer. It works by helping your immune system slow or stop the spread of cancer cells. It is a monoclonal antibody. This medicine may be used for other purposes; ask your health care provider or pharmacist if you have questions. COMMON BRAND NAME(S): IMFINZI What should I tell my care team before I take this medication? They need to know if you have any of these conditions: Allogeneic stem cell transplant (uses someone else's stem cells) Autoimmune diseases, such as Crohn disease, ulcerative colitis, lupus History of chest radiation Nervous system problems, such as Guillain-Barre syndrome, myasthenia gravis Organ transplant An unusual or allergic reaction to durvalumab, other medications, foods, dyes, or preservatives Pregnant  or trying to get pregnant Breast-feeding How should I use this medication? This medication is infused into a vein. It is given by your care team in a hospital or clinic setting. A special MedGuide will be given to you before each treatment. Be sure to read this information carefully each time. Talk to your care team about the use of this medication in children. Special care may be needed. Overdosage: If you think you have taken too much of this medicine contact a poison control center or emergency room at once. NOTE: This medicine is only for you. Do not share this medicine with others. What if I miss a dose? Keep appointments for follow-up doses. It is important not to miss your dose. Call your care team if you are unable to keep an appointment. What may interact with this medication? Interactions have not been studied. This list may not describe all possible interactions. Give your health care provider a list of all the medicines, herbs, non-prescription drugs, or dietary supplements you use. Also tell them if you smoke, drink alcohol, or use illegal drugs. Some items may interact with your medicine. What should I watch for while using this medication? Your condition will be monitored carefully while you are receiving this medication. You may need blood work while taking this medication. This medication may cause serious skin reactions. They can happen weeks to months after starting the medication. Contact your care team right away if you notice fevers or flu-like symptoms with a rash. The rash may be red or purple and then turn into blisters or peeling of the skin. You may also notice a red rash with  swelling of the face, lips, or lymph nodes in your neck or under your arms. Tell your care team right away if you have any change in your eyesight. Talk to your care team if you may be pregnant. Serious birth defects can occur if you take this medication during pregnancy and for 3 months after the  last dose. You will need a negative pregnancy test before starting this medication. Contraception is recommended while taking this medication and for 3 months after the last dose. Your care team can help you find the option that works for you. Do not breastfeed while taking this medication and for 3 months after the last dose. What side effects may I notice from receiving this medication? Side effects that you should report to your care team as soon as possible: Allergic reactions--skin rash, itching, hives, swelling of the face, lips, tongue, or throat Dry cough, shortness of breath or trouble breathing Eye pain, redness, irritation, or discharge with blurry or decreased vision Heart muscle inflammation--unusual weakness or fatigue, shortness of breath, chest pain, fast or irregular heartbeat, dizziness, swelling of the ankles, feet, or hands Hormone gland problems--headache, sensitivity to light, unusual weakness or fatigue, dizziness, fast or irregular heartbeat, increased sensitivity to cold or heat, excessive sweating, constipation, hair loss, increased thirst or amount of urine, tremors or shaking, irritability Infusion reactions--chest pain, shortness of breath or trouble breathing, feeling faint or lightheaded Kidney injury (glomerulonephritis)--decrease in the amount of urine, red or dark brown urine, foamy or bubbly urine, swelling of the ankles, hands, or feet Liver injury--right upper belly pain, loss of appetite, nausea, light-colored stool, dark yellow or brown urine, yellowing skin or eyes, unusual weakness or fatigue Pain, tingling, or numbness in the hands or feet, muscle weakness, change in vision, confusion or trouble speaking, loss of balance or coordination, trouble walking, seizures Rash, fever, and swollen lymph nodes Redness, blistering, peeling, or loosening of the skin, including inside the mouth Sudden or severe stomach pain, bloody diarrhea, fever, nausea, vomiting Side  effects that usually do not require medical attention (report these to your care team if they continue or are bothersome): Bone, joint, or muscle pain Diarrhea Fatigue Loss of appetite Nausea Skin rash This list may not describe all possible side effects. Call your doctor for medical advice about side effects. You may report side effects to FDA at 1-800-FDA-1088. Where should I keep my medication? This medication is given in a hospital or clinic. It will not be stored at home. NOTE: This sheet is a summary. It may not cover all possible information. If you have questions about this medicine, talk to your doctor, pharmacist, or health care provider.  2023 Elsevier/Gold Standard (2021-12-19 00:00:00)   BELOW ARE SYMPTOMS THAT SHOULD BE REPORTED IMMEDIATELY: *FEVER GREATER THAN 100.4 F (38 C) OR HIGHER *CHILLS OR SWEATING *NAUSEA AND VOMITING THAT IS NOT CONTROLLED WITH YOUR NAUSEA MEDICATION *UNUSUAL SHORTNESS OF BREATH *UNUSUAL BRUISING OR BLEEDING *URINARY PROBLEMS (pain or burning when urinating, or frequent urination) *BOWEL PROBLEMS (unusual diarrhea, constipation, pain near the anus) TENDERNESS IN MOUTH AND THROAT WITH OR WITHOUT PRESENCE OF ULCERS (sore throat, sores in mouth, or a toothache) UNUSUAL RASH, SWELLING OR PAIN  UNUSUAL VAGINAL DISCHARGE OR ITCHING   Items with * indicate a potential emergency and should be followed up as soon as possible or go to the Emergency Department if any problems should occur.  Please show the CHEMOTHERAPY ALERT CARD or IMMUNOTHERAPY ALERT CARD at check-in to the Emergency Department and  nurse.  Should you have questions after your visit or need to cancel or reschedule your appointment, please contact [facility] [phone]  and follow the prompts.  Office hours are 8:00 a.m. to 4:30 p.m. Monday - Friday. Please note that voicemails left after 4:00 p.m. may not be returned until the following business day.  We are  closed weekends and major holidays. You have access to a nurse at all times for urgent questions. Please call the main number to the clinic [phone] and follow the prompts.  For any non-urgent questions, you may also contact your provider using MyChart. We now offer e-Visits for anyone 18 and older to request care online for non-urgent symptoms. For details visit [email or web address].   Also download the MyChart app! Go to the app store, search "MyChart", open the app, select [facility], and log in with your MyChart username and password.   

## 2022-10-19 ENCOUNTER — Encounter: Payer: Self-pay | Admitting: Hematology

## 2022-10-19 ENCOUNTER — Other Ambulatory Visit: Payer: Self-pay | Admitting: Hematology

## 2022-10-31 ENCOUNTER — Inpatient Hospital Stay: Payer: Medicare Other | Attending: Hematology | Admitting: Hematology

## 2022-10-31 ENCOUNTER — Inpatient Hospital Stay: Payer: Medicare Other

## 2022-10-31 VITALS — BP 121/72 | HR 68 | Temp 97.4°F | Resp 18

## 2022-10-31 DIAGNOSIS — R42 Dizziness and giddiness: Secondary | ICD-10-CM | POA: Diagnosis not present

## 2022-10-31 DIAGNOSIS — Z8551 Personal history of malignant neoplasm of bladder: Secondary | ICD-10-CM | POA: Diagnosis not present

## 2022-10-31 DIAGNOSIS — C221 Intrahepatic bile duct carcinoma: Secondary | ICD-10-CM | POA: Diagnosis present

## 2022-10-31 DIAGNOSIS — Z87891 Personal history of nicotine dependence: Secondary | ICD-10-CM | POA: Diagnosis not present

## 2022-10-31 DIAGNOSIS — G479 Sleep disorder, unspecified: Secondary | ICD-10-CM | POA: Insufficient documentation

## 2022-10-31 DIAGNOSIS — R0602 Shortness of breath: Secondary | ICD-10-CM | POA: Diagnosis not present

## 2022-10-31 DIAGNOSIS — Z5112 Encounter for antineoplastic immunotherapy: Secondary | ICD-10-CM | POA: Insufficient documentation

## 2022-10-31 DIAGNOSIS — R609 Edema, unspecified: Secondary | ICD-10-CM | POA: Insufficient documentation

## 2022-10-31 DIAGNOSIS — Z95828 Presence of other vascular implants and grafts: Secondary | ICD-10-CM

## 2022-10-31 DIAGNOSIS — Z08 Encounter for follow-up examination after completed treatment for malignant neoplasm: Secondary | ICD-10-CM

## 2022-10-31 DIAGNOSIS — G629 Polyneuropathy, unspecified: Secondary | ICD-10-CM | POA: Diagnosis not present

## 2022-10-31 DIAGNOSIS — C787 Secondary malignant neoplasm of liver and intrahepatic bile duct: Secondary | ICD-10-CM

## 2022-10-31 DIAGNOSIS — R519 Headache, unspecified: Secondary | ICD-10-CM | POA: Diagnosis not present

## 2022-10-31 DIAGNOSIS — Z79899 Other long term (current) drug therapy: Secondary | ICD-10-CM | POA: Insufficient documentation

## 2022-10-31 DIAGNOSIS — R634 Abnormal weight loss: Secondary | ICD-10-CM | POA: Diagnosis not present

## 2022-10-31 LAB — CBC WITH DIFFERENTIAL/PLATELET
Abs Immature Granulocytes: 0.02 10*3/uL (ref 0.00–0.07)
Basophils Absolute: 0 10*3/uL (ref 0.0–0.1)
Basophils Relative: 1 %
Eosinophils Absolute: 0.2 10*3/uL (ref 0.0–0.5)
Eosinophils Relative: 4 %
HCT: 36.2 % — ABNORMAL LOW (ref 39.0–52.0)
Hemoglobin: 12 g/dL — ABNORMAL LOW (ref 13.0–17.0)
Immature Granulocytes: 0 %
Lymphocytes Relative: 16 %
Lymphs Abs: 0.9 10*3/uL (ref 0.7–4.0)
MCH: 30.8 pg (ref 26.0–34.0)
MCHC: 33.1 g/dL (ref 30.0–36.0)
MCV: 92.8 fL (ref 80.0–100.0)
Monocytes Absolute: 0.8 10*3/uL (ref 0.1–1.0)
Monocytes Relative: 14 %
Neutro Abs: 3.7 10*3/uL (ref 1.7–7.7)
Neutrophils Relative %: 65 %
Platelets: 238 10*3/uL (ref 150–400)
RBC: 3.9 MIL/uL — ABNORMAL LOW (ref 4.22–5.81)
RDW: 12.6 % (ref 11.5–15.5)
WBC: 5.6 10*3/uL (ref 4.0–10.5)
nRBC: 0 % (ref 0.0–0.2)

## 2022-10-31 LAB — COMPREHENSIVE METABOLIC PANEL
ALT: 20 U/L (ref 0–44)
AST: 23 U/L (ref 15–41)
Albumin: 3.7 g/dL (ref 3.5–5.0)
Alkaline Phosphatase: 70 U/L (ref 38–126)
Anion gap: 7 (ref 5–15)
BUN: 35 mg/dL — ABNORMAL HIGH (ref 8–23)
CO2: 25 mmol/L (ref 22–32)
Calcium: 9.1 mg/dL (ref 8.9–10.3)
Chloride: 103 mmol/L (ref 98–111)
Creatinine, Ser: 1.46 mg/dL — ABNORMAL HIGH (ref 0.61–1.24)
GFR, Estimated: 52 mL/min — ABNORMAL LOW (ref >60)
Glucose, Bld: 122 mg/dL — ABNORMAL HIGH (ref 70–99)
Potassium: 3.9 mmol/L (ref 3.5–5.1)
Sodium: 135 mmol/L (ref 135–145)
Total Bilirubin: 0.5 mg/dL (ref 0.3–1.2)
Total Protein: 6.7 g/dL (ref 6.5–8.1)

## 2022-10-31 LAB — MAGNESIUM: Magnesium: 2 mg/dL (ref 1.7–2.4)

## 2022-10-31 LAB — TSH: TSH: 3.986 u[IU]/mL (ref 0.350–4.500)

## 2022-10-31 MED ORDER — SODIUM CHLORIDE 0.9 % IV SOLN: Freq: Once | INTRAVENOUS | Status: AC

## 2022-10-31 MED ORDER — SODIUM CHLORIDE 0.9% FLUSH
10.0000 mL | Freq: Once | INTRAVENOUS | Status: AC
  Administered 2022-10-31: 10 mL via INTRAVENOUS

## 2022-10-31 MED ORDER — SODIUM CHLORIDE 0.9% FLUSH
10.0000 mL | INTRAVENOUS | Status: DC | PRN
  Administered 2022-10-31: 10 mL

## 2022-10-31 MED ORDER — METOLAZONE 5 MG PO TABS: 5.0000 mg | ORAL_TABLET | ORAL | 2 refills | Status: DC

## 2022-10-31 MED ORDER — HEPARIN SOD (PORK) LOCK FLUSH 100 UNIT/ML IV SOLN
500.0000 [IU] | Freq: Once | INTRAVENOUS | Status: AC | PRN
  Administered 2022-10-31: 500 [IU]

## 2022-10-31 MED ORDER — SODIUM CHLORIDE 0.9 % IV SOLN
1500.0000 mg | Freq: Once | INTRAVENOUS | Status: AC
  Administered 2022-10-31: 1500 mg via INTRAVENOUS
  Filled 2022-10-31: qty 30

## 2022-10-31 NOTE — Progress Notes (Signed)
Patient has been examined by Dr. Katragadda, and vital signs and labs have been reviewed. ANC, Creatinine, LFTs, hemoglobin, and platelets are within treatment parameters per M.D. - pt may proceed with treatment.  Primary RN and pharmacy notified.  

## 2022-10-31 NOTE — Progress Notes (Signed)
Pt presents today for Imfinzi per provider's order. Vital signs and labs WNL for treatment. Okay to proceed with treatment today per Dr.K.  Treatment given today per MD orders. Tolerated infusion without adverse affects. Vital signs stable. No complaints at this time. Discharged from clinic ambulatory in stable condition. Alert and oriented x 3. F/U with [facility] as scheduled.

## 2022-10-31 NOTE — Progress Notes (Signed)
[facility] 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  PRIOR THERAPY: none  NGS Results: No targetable mutations.  PD-L1 [id number]) negative.  MSI-stable.  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, a 67 y.o. male, seen for follow-up of cholangiocarcinoma and toxicity assessment prior to next cycle of immunotherapy.  He is taking Bumex 2 mg daily.  He still feels tightness in his feet and lower legs.  He lost 7 pounds since last visit.  He reports that his feet feel heavy at times.  No immunotherapy related side effects noted.  Appetite has been good.  REVIEW OF SYSTEMS:  Review of Systems  Constitutional:  Negative for unexpected weight change.  Respiratory:  Positive for shortness of breath.   Cardiovascular:  Positive for leg swelling.  Neurological:  Positive for dizziness, headaches and numbness (Feet feel cold and numb with occasional bee sting).  All other systems reviewed and are negative.   PAST MEDICAL/SURGICAL HISTORY:  Past Medical  History:  Diagnosis Date   Anxiety    Bladder cancer ([name]) 09/11/2009   CAD (coronary artery disease), native coronary artery    a. Mildly elevated troponin 03/2013, cath with nonobstructive disease including 50% AV groove distal stenosis before large OM   Essential hypertension    Headache(784.0)    History of migraines   Hyperglycemia    Mixed hyperlipidemia    Neuropathy    Obesity    Port-A-Cath in place 09/30/2021   Pre-diabetes    Seasonal allergies    Sleep apnea    On CPAP   Past Surgical History:  Procedure Laterality Date   BIOPSY  09/23/2021   Procedure: BIOPSY;  Surgeon: Harvel Quale, MD;  [facility];  Service: Gastroenterology;;   COLONOSCOPY  06/28/2011   Procedure: COLONOSCOPY;  Surgeon: Rogene Houston, MD;  [facility];  Service: Endoscopy;  Laterality: N/A;   COLONOSCOPY WITH PROPOFOL N/A 09/23/2021   Procedure: COLONOSCOPY WITH PROPOFOL;  Surgeon: Harvel Quale, MD;  [facility];  Service: Gastroenterology;  Laterality: N/A;  940   ESOPHAGOGASTRODUODENOSCOPY (EGD) WITH PROPOFOL N/A 09/23/2021   Procedure: ESOPHAGOGASTRODUODENOSCOPY (EGD) WITH PROPOFOL;  Surgeon: Harvel Quale, MD;  [facility];  Service: Gastroenterology;  Laterality: N/A;   IR IMAGING GUIDED PORT INSERTION  09/28/2021   IR PARACENTESIS  09/28/2021   JOINT REPLACEMENT Right    hip   LEFT HEART CATHETERIZATION WITH CORONARY ANGIOGRAM N/A 03/31/2013   Procedure: LEFT HEART CATHETERIZATION WITH CORONARY ANGIOGRAM;  Surgeon: Minus Breeding, MD;  Location: [facility]  CATH LAB;  Service: Cardiovascular;  Laterality: N/A;   POLYPECTOMY  09/23/2021   Procedure: POLYPECTOMY;  Surgeon: Harvel Quale, MD;  [facility];  Service: Gastroenterology;;   TURBT  09/11/2009    SOCIAL HISTORY:  Social History   Socioeconomic History   Marital status: Divorced    Spouse name: Not on file   Number of children: 0    Years of education: college   Highest education level: Not on file  Occupational History    Employer: SELF-EMPLOYED  Tobacco Use   Smoking status: Former    Packs/day: 0.50    Years: 10.00    Total pack years: 5.00    Types: Cigarettes    Quit date: 07/10/2011    Years since quitting: 11.3   Smokeless tobacco: Never   Tobacco comments:    Quit several yrs prior to 03/2013.  Vaping Use   Vaping Use: Never used  Substance and Sexual Activity   Alcohol use: Yes    Alcohol/week: 0.0 standard drinks of alcohol    Comment: Occasional   Drug use: No   Sexual activity: Not on file  Other Topics Concern   Not on file  Social History Narrative   Not on file   Social Determinants of Health   Financial Resource Strain: Not on file  Food Insecurity: No Food Insecurity (06/05/2022)   Hunger Vital Sign    Worried About Running Out of Food in the Last Year: Never true    Ran Out of Food in the Last Year: Never true  Transportation Needs: No Transportation Needs (06/05/2022)   PRAPARE - Hydrologist (Medical): No    Lack of Transportation (Non-Medical): No  Physical Activity: Not on file  Stress: Not on file  Social Connections: Not on file  Intimate Partner Violence: Not At Risk (06/05/2022)   Humiliation, Afraid, Rape, and Kick questionnaire    Fear of Current or Ex-Partner: No    Emotionally Abused: No    Physically Abused: No    Sexually Abused: No    FAMILY HISTORY:  Family History  Problem Relation Age of Onset   COPD Father     CURRENT MEDICATIONS:  Current Outpatient Medications  Medication Sig Dispense Refill   amLODipine (NORVASC) 5 MG tablet Take 5 mg by mouth daily.     aspirin EC 81 MG tablet Take 81 mg by mouth daily.     bumetanide (BUMEX) 1 MG tablet Take 1 tablet (1 mg total) by mouth in the morning. 30 tablet 0   buPROPion (WELLBUTRIN XL) 300 MG 24 hr tablet Take 300 mg by mouth daily.     CISPLATIN IV Inject into the vein  once a week. Day 1, Day 8 q 21 days Every other week.     cyclobenzaprine (FLEXERIL) 10 MG tablet Take 10 mg by mouth at bedtime as needed for muscle spasms.     Durvalumab (IMFINZI IV) Inject into the vein every 21 ( twenty-one) days.     Elastic Bandages & Supports (ELASTIC BANDAGE 6") MISC Use compression wraps during the day for leg swelling 4 each 0   Eszopiclone 3 MG TABS Take 3 mg by mouth at bedtime. Take immediately before bedtime     famotidine (PEPCID) 20 MG tablet TAKE (1) TABLET BY MOUTH AT BEDTIME. 30 tablet 0   fenofibrate 160 MG tablet Take 160 mg by mouth daily.     FLUoxetine (PROZAC) 10 MG capsule Take 10  mg by mouth daily.     furosemide (LASIX) 40 MG tablet Take 1 tablet (40 mg total) by mouth daily. 30 tablet 1   gabapentin (NEURONTIN) 300 MG capsule Take 1 capsule (300 mg total) by mouth 3 (three) times daily. 90 capsule 3   Gauze Pads & Dressings (ABDOMINAL PAD) 8"X10" PADS Use for leaking fluid from leg swelling 360 each 0   Gemcitabine HCl (GEMZAR IV) Inject into the vein once a week. Day 1, Day 8 q 21 days Every other week     magnesium oxide (MAG-OX) 400 (240 Mg) MG tablet Take 1 tablet (400 mg total) by mouth 2 (two) times daily. (Patient taking differently: Take 400 mg by mouth 3 (three) times daily.) 90 tablet 6   metFORMIN (GLUCOPHAGE-XR) 500 MG 24 hr tablet Take 500 mg by mouth 2 (two) times daily.     olmesartan (BENICAR) 40 MG tablet TAKE (1) TABLET BY MOUTH ONCE DAILY. 30 tablet 0   omeprazole (PRILOSEC) 40 MG capsule Take 40 mg by mouth daily as needed (reflux).     pantoprazole (PROTONIX) 40 MG tablet Take 1 tablet (40 mg total) by mouth daily. (Patient taking differently: Take 40 mg by mouth daily as needed (reflux).) 90 tablet 3   pramipexole (MIRAPEX) 0.75 MG tablet Take 1 tablet (0.75 mg total) by mouth 3 (three) times daily. 90 tablet 0   RYBELSUS 7 MG TABS Take 14 mg by mouth daily.     tamsulosin (FLOMAX) 0.4 MG CAPS capsule Take 0.4 mg by mouth daily.      traMADol (ULTRAM) 50 MG tablet Take 50 mg by mouth 4 (four) times daily as needed for moderate pain.     zaleplon (SONATA) 10 MG capsule Take 10 mg by mouth at bedtime as needed.     nitroGLYCERIN (NITROSTAT) 0.4 MG SL tablet Place 1 tablet (0.4 mg total) under the tongue every 5 (five) minutes as needed for chest pain. (Patient not taking: Reported on 10/31/2022) 25 tablet 3   No current facility-administered medications for this visit.   Facility-Administered Medications Ordered in Other Visits  Medication Dose Route Frequency Provider Last Rate Last Admin   magnesium sulfate 2 GM/50ML IVPB             ALLERGIES:  No Known Allergies  PHYSICAL EXAM:  Performance status (ECOG): 0 - Asymptomatic  There were no vitals filed for this visit. Wt Readings from Last 3 Encounters:  10/31/22 283 lb 6.4 oz (128.5 kg)  10/03/22 290 lb 6.4 oz (131.7 kg)  09/05/22 276 lb (125.2 kg)   Physical Exam Vitals reviewed.  Constitutional:      Appearance: Normal appearance.  Cardiovascular:     Rate and Rhythm: Normal rate and regular rhythm.     Pulses: Normal pulses.     Heart sounds: Normal heart sounds.  Pulmonary:     Effort: Pulmonary effort is normal.     Breath sounds: Normal breath sounds.  Neurological:     General: No focal deficit present.     Mental Status: He is alert and oriented to person, place, and time.  Psychiatric:        Mood and Affect: Mood normal.        Behavior: Behavior normal.    LABORATORY DATA:  I have reviewed the labs as listed.     Latest Ref Rng & Units 10/03/2022    9:40 AM 09/05/2022    9:45 AM 08/09/2022    8:00 AM  CBC  WBC 4.0 - 10.5 K/uL 5.1  5.0  4.6   Hemoglobin 13.0 - 17.0 g/dL 11.5  11.9  11.5   Hematocrit 39.0 - 52.0 % 34.2  36.5  34.3   Platelets 150 - 400 K/uL 210  216  215       Latest Ref Rng & Units 10/03/2022    9:40 AM 09/05/2022    9:45 AM 08/09/2022    8:00 AM  CMP  Glucose 70 - 99 mg/dL 135  131  114   BUN 8 - 23 mg/dL  36  35  33   Creatinine 0.61 - 1.24 mg/dL 1.40  1.60  1.32   Sodium 135 - 145 mmol/L 137  139  139   Potassium 3.5 - 5.1 mmol/L 4.1  4.0  4.0   Chloride 98 - 111 mmol/L 102  105  103   CO2 22 - 32 mmol/L 24  27  26   $ Calcium 8.9 - 10.3 mg/dL 9.0  9.0  9.4   Total Protein 6.5 - 8.1 g/dL 6.5  6.7  6.6   Total Bilirubin 0.3 - 1.2 mg/dL 0.2  0.4  0.3   Alkaline Phos 38 - 126 U/L 69  74  61   AST 15 - 41 U/L 26  25  20   $ ALT 0 - 44 U/L 21  24  19     $ DIAGNOSTIC IMAGING:  I have independently reviewed the scans and discussed with the patient. No results found.   ASSESSMENT:  Liver lesion/peritoneal carcinomatosis: - Patient seen at the request of Dr. Delphina Cahill - Reported pressure on the sides of the abdomen with slight pain for the last 1 month.  He also reported pain in the epigastric region. - He had lost 20 pounds in the last 3 to 4 months intentionally, cutting back on sugars.  He was also started on semaglutide. - Colonoscopy on 06/28/2011 with a benign polypoid colonic mucosa in the descending colon.  No evidence of malignancy. - MRI of the brain was negative. - EGD and colonoscopy on 09/23/2018 did not reveal any malignancies. 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  IHC positive for CK7, CDX2 and negative for GATA3.  Findings suggestive of upper GI or pancreaticobiliary primary. - Cycle 1 of gemcitabine, cisplatin and durvalumab started on 10/05/2021. - NGS: No targetable mutations.  PD-L1 [id number]) negative.  MSI-stable.  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   Social/family history: - He lives at home by himself.  He records voiceover/narrations. - Non-smoker. - Father died of MDS.  Paternal grandfather had prostate cancer.  Maternal grandfather had cancer.  3.  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 Alinda Money.  Reportedly received 1 treatment of intravesical chemo and has been on surveillance since  then.  Last surveillance visit was in 2020.   PLAN:  Cholangiocarcinoma with peritoneal carcinomatosis: - CT CAP on 08/07/2022: Continued decrease in size of the hepatic lesion with no new liver lesions or evidence of progression. - Reviewed labs today which showed normal LFTs.  Creatinine is stable at 1.46.  CBC was grossly normal with mild anemia with hemoglobin 12.  TSH is 3.9. - Last CEA and CA 19-9 was normal.  We have sent another level today. - Proceed with Imfinzi today.  RTC 4 weeks for follow-up.  Repeat CT CAP prior to next visit.   Fluid retention: - Continue Bumex 2 mg daily. - He lost 7 pounds  since last visit but still has feet swelling and feet feeling heavy.  I will start him on metolazone 5 mg 3 times weekly.  3.  Neuropathy/"drawing" of legs: - Continue Mirapex 0.75 mg 3 times daily.  4.  Sleeping difficulty: - Continue Lunesta as needed.  5.  Hypomagnesemia: - Continue magnesium 3 times daily.  Magnesium is 2.0.   Orders placed this encounter:  No orders of the defined types were placed in this encounter.     Derek Jack, MD [place] [phone]

## 2022-10-31 NOTE — Patient Instructions (Addendum)
[place] at [facility] Discharge Instructions   You were seen and examined today by Dr. Delton Coombes.  He reviewed the results of your lab work which are normal/stable.  We will proceed with your treatment today.   He sent a prescription to your pharmacy for metolazone. Take this 3 times weekly as prescribed to help with fluid retention.    Thank you for choosing [place] at [facility] to provide your oncology and hematology care.  To afford each patient quality time with our provider, please arrive at least 15 minutes before your scheduled appointment time.   If you have a lab appointment with the [facility] please come in thru the Main Entrance and check in at the main information desk.  You need to re-schedule your appointment should you arrive 10 or more minutes late.  We strive to give you quality time with our providers, and arriving late affects you and other patients whose appointments are after yours.  Also, if you no show three or more times for appointments you may be dismissed from the clinic at the providers discretion.     Again, thank you for choosing [facility].  Our hope is that these requests will decrease the amount of time that you wait before being seen by our physicians.       _____________________________________________________________  Should you have questions after your visit to [facility], please contact our office at [phone] and follow the prompts.  Our office hours are 8:00 a.m. and 4:30 p.m. Monday - Friday.  Please note that voicemails left after 4:00 p.m. may not be returned until the following business day.  We are closed weekends and major holidays.  You do have access to a nurse 24-7, just call the main number to the clinic [phone] and do not press any options, hold on the line and a nurse will answer the phone.    For prescription refill requests, have your pharmacy  contact our office and allow 72 hours.    Due to Covid, you will need to wear a mask upon entering the hospital. If you do not have a mask, a mask will be given to you at the Main Entrance upon arrival. For doctor visits, patients may have 1 support person age 75 or older with them. For treatment visits, patients can not have anyone with them due to social distancing guidelines and our immunocompromised population.

## 2022-10-31 NOTE — Patient Instructions (Signed)
[facility]  Discharge Instructions: Thank you for choosing [facility] to provide your oncology and hematology care.  If you have a lab appointment with the Cancer Center, please come in thru the Main Entrance and check in at the main information desk.  Wear comfortable clothing and clothing appropriate for easy access to any Portacath or PICC line.   We strive to give you quality time with your provider. You may need to reschedule your appointment if you arrive late (15 or more minutes).  Arriving late affects you and other patients whose appointments are after yours.  Also, if you miss three or more appointments without notifying the office, you may be dismissed from the clinic at the provider's discretion.      For prescription refill requests, have your pharmacy contact our office and allow 72 hours for refills to be completed.    Today you received the following chemotherapy and/or immunotherapy agents Imfinzi   To help prevent nausea and vomiting after your treatment, we encourage you to take your nausea medication as directed.  Durvalumab Injection What is this medication? DURVALUMAB (dur VAL ue mab) treats some types of cancer. It works by helping your immune system slow or stop the spread of cancer cells. It is a monoclonal antibody. This medicine may be used for other purposes; ask your health care provider or pharmacist if you have questions. COMMON BRAND NAME(S): IMFINZI What should I tell my care team before I take this medication? They need to know if you have any of these conditions: Allogeneic stem cell transplant (uses someone else's stem cells) Autoimmune diseases, such as Crohn disease, ulcerative colitis, lupus History of chest radiation Nervous system problems, such as Guillain-Barre syndrome, myasthenia gravis Organ transplant An unusual or allergic reaction to durvalumab, other medications, foods, dyes, or preservatives Pregnant  or trying to get pregnant Breast-feeding How should I use this medication? This medication is infused into a vein. It is given by your care team in a hospital or clinic setting. A special MedGuide will be given to you before each treatment. Be sure to read this information carefully each time. Talk to your care team about the use of this medication in children. Special care may be needed. Overdosage: If you think you have taken too much of this medicine contact a poison control center or emergency room at once. NOTE: This medicine is only for you. Do not share this medicine with others. What if I miss a dose? Keep appointments for follow-up doses. It is important not to miss your dose. Call your care team if you are unable to keep an appointment. What may interact with this medication? Interactions have not been studied. This list may not describe all possible interactions. Give your health care provider a list of all the medicines, herbs, non-prescription drugs, or dietary supplements you use. Also tell them if you smoke, drink alcohol, or use illegal drugs. Some items may interact with your medicine. What should I watch for while using this medication? Your condition will be monitored carefully while you are receiving this medication. You may need blood work while taking this medication. This medication may cause serious skin reactions. They can happen weeks to months after starting the medication. Contact your care team right away if you notice fevers or flu-like symptoms with a rash. The rash may be red or purple and then turn into blisters or peeling of the skin. You may also notice a red rash with   swelling of the face, lips, or lymph nodes in your neck or under your arms. Tell your care team right away if you have any change in your eyesight. Talk to your care team if you may be pregnant. Serious birth defects can occur if you take this medication during pregnancy and for 3 months after the  last dose. You will need a negative pregnancy test before starting this medication. Contraception is recommended while taking this medication and for 3 months after the last dose. Your care team can help you find the option that works for you. Do not breastfeed while taking this medication and for 3 months after the last dose. What side effects may I notice from receiving this medication? Side effects that you should report to your care team as soon as possible: Allergic reactions--skin rash, itching, hives, swelling of the face, lips, tongue, or throat Dry cough, shortness of breath or trouble breathing Eye pain, redness, irritation, or discharge with blurry or decreased vision Heart muscle inflammation--unusual weakness or fatigue, shortness of breath, chest pain, fast or irregular heartbeat, dizziness, swelling of the ankles, feet, or hands Hormone gland problems--headache, sensitivity to light, unusual weakness or fatigue, dizziness, fast or irregular heartbeat, increased sensitivity to cold or heat, excessive sweating, constipation, hair loss, increased thirst or amount of urine, tremors or shaking, irritability Infusion reactions--chest pain, shortness of breath or trouble breathing, feeling faint or lightheaded Kidney injury (glomerulonephritis)--decrease in the amount of urine, red or dark brown urine, foamy or bubbly urine, swelling of the ankles, hands, or feet Liver injury--right upper belly pain, loss of appetite, nausea, light-colored stool, dark yellow or brown urine, yellowing skin or eyes, unusual weakness or fatigue Pain, tingling, or numbness in the hands or feet, muscle weakness, change in vision, confusion or trouble speaking, loss of balance or coordination, trouble walking, seizures Rash, fever, and swollen lymph nodes Redness, blistering, peeling, or loosening of the skin, including inside the mouth Sudden or severe stomach pain, bloody diarrhea, fever, nausea, vomiting Side  effects that usually do not require medical attention (report these to your care team if they continue or are bothersome): Bone, joint, or muscle pain Diarrhea Fatigue Loss of appetite Nausea Skin rash This list may not describe all possible side effects. Call your doctor for medical advice about side effects. You may report side effects to FDA at 1-800-FDA-1088. Where should I keep my medication? This medication is given in a hospital or clinic. It will not be stored at home. NOTE: This sheet is a summary. It may not cover all possible information. If you have questions about this medicine, talk to your doctor, pharmacist, or health care provider.  2023 Elsevier/Gold Standard (2021-12-19 00:00:00)  BELOW ARE SYMPTOMS THAT SHOULD BE REPORTED IMMEDIATELY: *FEVER GREATER THAN 100.4 F (38 C) OR HIGHER *CHILLS OR SWEATING *NAUSEA AND VOMITING THAT IS NOT CONTROLLED WITH YOUR NAUSEA MEDICATION *UNUSUAL SHORTNESS OF BREATH *UNUSUAL BRUISING OR BLEEDING *URINARY PROBLEMS (pain or burning when urinating, or frequent urination) *BOWEL PROBLEMS (unusual diarrhea, constipation, pain near the anus) TENDERNESS IN MOUTH AND THROAT WITH OR WITHOUT PRESENCE OF ULCERS (sore throat, sores in mouth, or a toothache) UNUSUAL RASH, SWELLING OR PAIN  UNUSUAL VAGINAL DISCHARGE OR ITCHING   Items with * indicate a potential emergency and should be followed up as soon as possible or go to the Emergency Department if any problems should occur.  Please show the CHEMOTHERAPY ALERT CARD or IMMUNOTHERAPY ALERT CARD at check-in to the Emergency Department and triage   nurse.  Should you have questions after your visit or need to cancel or reschedule your appointment, please contact [facility] [phone]  and follow the prompts.  Office hours are 8:00 a.m. to 4:30 p.m. Monday - Friday. Please note that voicemails left after 4:00 p.m. may not be returned until the following business day.  We are  closed weekends and major holidays. You have access to a nurse at all times for urgent questions. Please call the main number to the clinic [phone] and follow the prompts.  For any non-urgent questions, you may also contact your provider using MyChart. We now offer e-Visits for anyone 18 and older to request care online for non-urgent symptoms. For details visit [email or web address].   Also download the MyChart app! Go to the app store, search "MyChart", open the app, select [facility] [place], and log in with your MyChart username and password.   

## 2022-11-02 LAB — CEA: CEA: 2.8 ng/mL (ref 0.0–4.7)

## 2022-11-04 ENCOUNTER — Other Ambulatory Visit: Payer: Self-pay

## 2022-11-08 LAB — CANCER ANTIGEN 19-9: CA 19-9: 11 U/mL (ref 0–35)

## 2022-11-21 ENCOUNTER — Ambulatory Visit (HOSPITAL_COMMUNITY)
Admission: RE | Admit: 2022-11-21 | Discharge: 2022-11-21 | Disposition: A | Discharge status: Home / Self Care | Payer: Medicare Other | Source: Ambulatory Visit | Attending: Hematology | Admitting: Hematology

## 2022-11-21 DIAGNOSIS — C787 Secondary malignant neoplasm of liver and intrahepatic bile duct: Secondary | ICD-10-CM | POA: Insufficient documentation

## 2022-11-21 DIAGNOSIS — C221 Intrahepatic bile duct carcinoma: Secondary | ICD-10-CM | POA: Insufficient documentation

## 2022-11-21 LAB — LAB REPORT - SCANNED
A1c: 6.1
Microalb Creat Ratio: 3

## 2022-11-21 MED ORDER — IOHEXOL 300 MG/ML  SOLN
100.0000 mL | Freq: Once | INTRAMUSCULAR | Status: AC | PRN
  Administered 2022-11-21: 100 mL via INTRAVENOUS

## 2022-11-27 NOTE — Progress Notes (Signed)
[facility] [address], [place] [address]    Clinic Day:  11/28/2022  Referring physician: Celene Squibb, MD  Patient Care Team: Celene Squibb, MD as PCP - General (Internal Medicine) Satira Sark, MD as Consulting Physician (Cardiology) Derek Jack, MD as Medical Oncologist (Medical Oncology)   ASSESSMENT & PLAN:   Assessment: Liver lesion/peritoneal carcinomatosis: - Patient seen at the request of Dr. Delphina Cahill - Reported pressure on the sides of the abdomen with slight pain for the last 1 month.  He also reported pain in the epigastric region. - He had lost 20 pounds in the last 3 to 4 months intentionally, cutting back on sugars.  He was also started on semaglutide. - Colonoscopy on 06/28/2011 with a benign polypoid colonic mucosa in the descending colon.  No evidence of malignancy. - MRI of the brain was negative. - EGD and colonoscopy on 09/23/2018 did not reveal any malignancies. 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  IHC positive for CK7, CDX2 and negative for GATA3.  Findings suggestive of upper GI or pancreaticobiliary primary. - Cycle 1 of gemcitabine, cisplatin and durvalumab started on 10/05/2021. - NGS: No targetable mutations.  PD-L1 [id number]) negative.  MSI-stable.  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   Social/family history: - He lives at home by himself.  He records voiceover/narrations. - Non-smoker. - Father died of MDS.  Paternal grandfather had prostate cancer.  Maternal grandfather had cancer.  3.  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 Alinda Money.  Reportedly received 1 treatment of intravesical chemo and has been on surveillance since then.  Last surveillance visit was in 2020.  Plan: Cholangiocarcinoma with peritoneal carcinomatosis: - CT CAP (11/21/2022): Stable exam with no new or progressive findings.  Stable 1.3 cm left liver lesion. -  CEA was 2.8 and CA 19-9 11. - Labs today shows normal LFTs and CBC.  TSH is 7.8, up from 3.9.  Will closely monitor. - Proceed with treatment today.  RTC 4 weeks for follow-up.   Fluid retention: - He is taking Bumex 1 mg daily and metolazone 5 mg 3 times weekly.  He does not report any increase in diuresis.  Weight is stable. - Will increase Bumex to 2 mg daily.  He has mild erythema on the legs which we will follow.  3.  Neuropathy/"drawing" of legs: - Continue Mirapex 0.75 mg 3 times daily.  4.  Sleeping difficulty: - Continue Lunesta as needed.  5.  Hypomagnesemia: - Continue magnesium 3 times daily.  Magnesium is 1.8.  Orders Placed This Encounter  Procedures   Magnesium    Standing Status:   Future    Standing Expiration Date:   12/26/2023   CBC with Differential    Standing Status:   Future    Standing Expiration Date:   12/27/2023   Comprehensive metabolic panel    Standing Status:   Future    Standing Expiration Date:   12/27/2023      I,Alexis Herring,acting as a scribe for Derek Jack, MD.,have documented all relevant documentation on the behalf of Derek Jack, MD,as directed by  Derek Jack, MD while in the presence of Derek Jack, MD.   I, Derek Jack MD, have reviewed the above documentation for accuracy and completeness, and I agree with the above.   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 male presenting to clinic today for follow up of cholangiocarcinoma and peritoneal carcinomatosis. He was last seen by me on 10/31/22.  Today, he states that he is doing well overall. His appetite level is at 100%. His energy level is at 100%. He denies any new pains.   PAST MEDICAL HISTORY:   Past Medical History: Past Medical History:  Diagnosis Date   Anxiety    Bladder cancer ([place]) 09/11/2009   CAD (coronary artery disease), native coronary artery    a. Mildly elevated troponin 03/2013, cath with nonobstructive disease including 50% AV groove distal stenosis before large OM   Essential hypertension    Headache(784.0)    History of migraines   Hyperglycemia    Mixed hyperlipidemia    Neuropathy    Obesity    Port-A-Cath in place 09/30/2021   Pre-diabetes    Seasonal allergies    Sleep apnea    On CPAP    Surgical History: Past Surgical History:  Procedure Laterality Date   BIOPSY  09/23/2021   Procedure: BIOPSY;  Surgeon: Harvel Quale, MD;  [facility];  Service: Gastroenterology;;   COLONOSCOPY  06/28/2011   Procedure: COLONOSCOPY;  Surgeon: Rogene Houston, MD;  [facility];  Service: Endoscopy;  Laterality: N/A;   COLONOSCOPY WITH PROPOFOL N/A 09/23/2021   Procedure: COLONOSCOPY WITH PROPOFOL;  Surgeon: Harvel Quale, MD;  [facility];  Service: Gastroenterology;  Laterality: N/A;  940   ESOPHAGOGASTRODUODENOSCOPY (EGD) WITH PROPOFOL N/A 09/23/2021   Procedure: ESOPHAGOGASTRODUODENOSCOPY (EGD) WITH PROPOFOL;  Surgeon: Harvel Quale, MD;  [facility];  Service: Gastroenterology;   Laterality: N/A;   IR IMAGING GUIDED PORT INSERTION  09/28/2021   IR PARACENTESIS  09/28/2021   JOINT REPLACEMENT Right    hip   LEFT HEART CATHETERIZATION WITH CORONARY ANGIOGRAM N/A 03/31/2013   Procedure: LEFT HEART CATHETERIZATION WITH CORONARY ANGIOGRAM;  Surgeon: Minus Breeding, MD;  Location: [facility] CATH LAB;  Service: Cardiovascular;  Laterality: N/A;   POLYPECTOMY  09/23/2021   Procedure: POLYPECTOMY;  Surgeon: Harvel Quale, MD;  [facility];  Service: Gastroenterology;;   TURBT  09/11/2009    Social History: Social History   Socioeconomic History   Marital status: Divorced    Spouse name: Not on file   Number of children: 0   Years of education: college   Highest education level: Not on file  Occupational History    Employer: SELF-EMPLOYED  Tobacco Use   Smoking status: Former    Packs/day: 0.50    Years: 10.00    Additional pack years: 0.00    Total pack years: 5.00    Types: Cigarettes    Quit date: 07/10/2011  Years since quitting: 11.3   Smokeless tobacco: Never   Tobacco comments:    Quit several yrs prior to 03/2013.  Vaping Use   Vaping Use: Never used  Substance and Sexual Activity   Alcohol use: Yes    Alcohol/week: 0.0 standard drinks of alcohol    Comment: Occasional   Drug use: No   Sexual activity: Not on file  Other Topics Concern   Not on file  Social History Narrative   Not on file   Social Determinants of Health   Financial Resource Strain: Not on file  Food Insecurity: No Food Insecurity (06/05/2022)   Hunger Vital Sign    Worried About Running Out of Food in the Last Year: Never true    Ran Out of Food in the Last Year: Never true  Transportation Needs: No Transportation Needs (06/05/2022)   PRAPARE - Hydrologist (Medical): No    Lack of Transportation (Non-Medical): No  Physical Activity: Not on file  Stress: Not on file  Social Connections: Not on file  Intimate Partner Violence:  Not At Risk (06/05/2022)   Humiliation, Afraid, Rape, and Kick questionnaire    Fear of Current or Ex-Partner: No    Emotionally Abused: No    Physically Abused: No    Sexually Abused: No    Family History: Family History  Problem Relation Age of Onset   COPD Father     Current Medications:  Current Outpatient Medications:    amLODipine (NORVASC) 5 MG tablet, Take 5 mg by mouth daily., Disp: , Rfl:    aspirin EC 81 MG tablet, Take 81 mg by mouth daily., Disp: , Rfl:    buPROPion (WELLBUTRIN XL) 300 MG 24 hr tablet, Take 300 mg by mouth daily., Disp: , Rfl:    CISPLATIN IV, Inject into the vein once a week. Day 1, Day 8 q 21 days Every other week., Disp: , Rfl:    cyclobenzaprine (FLEXERIL) 10 MG tablet, Take 10 mg by mouth at bedtime as needed for muscle spasms., Disp: , Rfl:    Durvalumab (IMFINZI IV), Inject into the vein every 21 ( twenty-one) days., Disp: , Rfl:    Elastic Bandages & Supports (ELASTIC BANDAGE 6") MISC, Use compression wraps during the day for leg swelling, Disp: 4 each, Rfl: 0   Eszopiclone 3 MG TABS, Take 3 mg by mouth at bedtime. Take immediately before bedtime, Disp: , Rfl:    famotidine (PEPCID) 20 MG tablet, TAKE (1) TABLET BY MOUTH AT BEDTIME., Disp: 30 tablet, Rfl: 0   fenofibrate 160 MG tablet, Take 160 mg by mouth daily., Disp: , Rfl:    FLUoxetine (PROZAC) 10 MG capsule, Take 10 mg by mouth daily., Disp: , Rfl:    furosemide (LASIX) 40 MG tablet, Take 1 tablet (40 mg total) by mouth daily., Disp: 30 tablet, Rfl: 1   gabapentin (NEURONTIN) 300 MG capsule, Take 1 capsule (300 mg total) by mouth 3 (three) times daily., Disp: 90 capsule, Rfl: 3   Gauze Pads & Dressings (ABDOMINAL PAD) 8"X10" PADS, Use for leaking fluid from leg swelling, Disp: 360 each, Rfl: 0   Gemcitabine HCl (GEMZAR IV), Inject into the vein once a week. Day 1, Day 8 q 21 days Every other week, Disp: , Rfl:    magnesium oxide (MAG-OX) 400 (240 Mg) MG tablet, Take 1 tablet (400 mg total) by  mouth 2 (two) times daily. (Patient taking differently: Take 400 mg by mouth 3 (three)  times daily.), Disp: 90 tablet, Rfl: 6   metFORMIN (GLUCOPHAGE-XR) 500 MG 24 hr tablet, Take 500 mg by mouth 2 (two) times daily., Disp: , Rfl:    metolazone (ZAROXOLYN) 5 MG tablet, Take 1 tablet (5 mg total) by mouth 3 (three) times a week., Disp: 30 tablet, Rfl: 2   nitroGLYCERIN (NITROSTAT) 0.4 MG SL tablet, Place 1 tablet (0.4 mg total) under the tongue every 5 (five) minutes as needed for chest pain., Disp: 25 tablet, Rfl: 3   olmesartan (BENICAR) 40 MG tablet, TAKE (1) TABLET BY MOUTH ONCE DAILY., Disp: 30 tablet, Rfl: 0   omeprazole (PRILOSEC) 40 MG capsule, Take 40 mg by mouth daily as needed (reflux)., Disp: , Rfl:    pantoprazole (PROTONIX) 40 MG tablet, Take 1 tablet (40 mg total) by mouth daily. (Patient taking differently: Take 40 mg by mouth daily as needed (reflux).), Disp: 90 tablet, Rfl: 3   pramipexole (MIRAPEX) 0.75 MG tablet, Take 1 tablet (0.75 mg total) by mouth 3 (three) times daily., Disp: 90 tablet, Rfl: 0   RYBELSUS 7 MG TABS, Take 14 mg by mouth daily., Disp: , Rfl:    tamsulosin (FLOMAX) 0.4 MG CAPS capsule, Take 0.4 mg by mouth daily., Disp: , Rfl:    traMADol (ULTRAM) 50 MG tablet, Take 50 mg by mouth 4 (four) times daily as needed for moderate pain., Disp: , Rfl:    zaleplon (SONATA) 10 MG capsule, Take 10 mg by mouth at bedtime as needed., Disp: , Rfl:    bumetanide (BUMEX) 2 MG tablet, Take 1 tablet (2 mg total) by mouth in the morning., Disp: 30 tablet, Rfl: 2 No current facility-administered medications for this visit.  Facility-Administered Medications Ordered in Other Visits:    magnesium sulfate 2 GM/50ML IVPB, , , ,    sodium chloride flush (NS) 0.9 % injection 10 mL, 10 mL, Intracatheter, PRN, Derek Jack, MD, 10 mL at 11/28/22 1253   Allergies: No Known Allergies  REVIEW OF SYSTEMS:   Review of Systems  Constitutional:  Negative for chills, fatigue and  fever.  HENT:   Negative for lump/mass, mouth sores, nosebleeds, sore throat and trouble swallowing.   Eyes:  Negative for eye problems.  Respiratory:  Positive for cough and shortness of breath.   Cardiovascular:  Positive for leg swelling. Negative for chest pain and palpitations.  Gastrointestinal:  Negative for abdominal pain, constipation, diarrhea, nausea and vomiting.  Genitourinary:  Negative for bladder incontinence, difficulty urinating, dysuria, frequency, hematuria and nocturia.   Musculoskeletal:  Negative for arthralgias, back pain, flank pain, myalgias and neck pain.  Skin:  Negative for itching and rash.  Neurological:  Positive for numbness. Negative for dizziness and headaches.  Hematological:  Does not bruise/bleed easily.  Psychiatric/Behavioral:  Negative for depression, sleep disturbance and suicidal ideas. The patient is not nervous/anxious.   All other systems reviewed and are negative.    VITALS:   Blood pressure (!) 146/81, pulse 78, temperature (!) 97 F (36.1 C), temperature source Oral, resp. rate 20, weight 284 lb (128.8 kg), SpO2 97 %.  Wt Readings from Last 3 Encounters:  11/28/22 284 lb (128.8 kg)  11/28/22 284 lb (128.8 kg)  10/31/22 283 lb 6.4 oz (128.5 kg)    Body mass index is 38.52 kg/m.  Performance status (ECOG): 1 - Symptomatic but completely ambulatory  PHYSICAL EXAM:   Physical Exam Vitals and nursing note reviewed. Exam conducted with a chaperone present.  Constitutional:      Appearance:  Normal appearance.  Cardiovascular:     Rate and Rhythm: Normal rate and regular rhythm.     Pulses: Normal pulses.     Heart sounds: Normal heart sounds.  Pulmonary:     Effort: Pulmonary effort is normal.     Breath sounds: Normal breath sounds.  Abdominal:     Palpations: Abdomen is soft. There is no hepatomegaly, splenomegaly or mass.     Tenderness: There is no abdominal tenderness.  Musculoskeletal:     Right lower leg: Edema present.      Left lower leg: Edema present.  Lymphadenopathy:     Cervical: No cervical adenopathy.     Right cervical: No superficial, deep or posterior cervical adenopathy.    Left cervical: No superficial, deep or posterior cervical adenopathy.     Upper Body:     Right upper body: No supraclavicular or axillary adenopathy.     Left upper body: No supraclavicular or axillary adenopathy.  Neurological:     General: No focal deficit present.     Mental Status: He is alert and oriented to person, place, and time.  Psychiatric:        Mood and Affect: Mood normal.        Behavior: Behavior normal.     LABS:      Latest Ref Rng & Units 11/28/2022    9:45 AM 10/31/2022    9:31 AM 10/03/2022    9:40 AM  CBC  WBC 4.0 - 10.5 K/uL 6.0  5.6  5.1   Hemoglobin 13.0 - 17.0 g/dL 12.2  12.0  11.5   Hematocrit 39.0 - 52.0 % 37.3  36.2  34.2   Platelets 150 - 400 K/uL 242  238  210       Latest Ref Rng & Units 11/28/2022    9:45 AM 10/31/2022    9:31 AM 10/03/2022    9:40 AM  CMP  Glucose 70 - 99 mg/dL 85  122  135   BUN 8 - 23 mg/dL 37  35  36   Creatinine 0.61 - 1.24 mg/dL 1.45  1.46  1.40   Sodium 135 - 145 mmol/L 132  135  137   Potassium 3.5 - 5.1 mmol/L 3.6  3.9  4.1   Chloride 98 - 111 mmol/L 94  103  102   CO2 22 - 32 mmol/L 27  25  24    Calcium 8.9 - 10.3 mg/dL 9.2  9.1  9.0   Total Protein 6.5 - 8.1 g/dL 6.7  6.7  6.5   Total Bilirubin 0.3 - 1.2 mg/dL 0.8  0.5  0.2   Alkaline Phos 38 - 126 U/L 65  70  69   AST 15 - 41 U/L 25  23  26    ALT 0 - 44 U/L 21  20  21       Lab Results  Component Value Date   CEA1 2.8 10/31/2022   /  CEA  Date Value Ref Range Status  10/31/2022 2.8 0.0 - 4.7 ng/mL Final    Comment:    (NOTE)                             Nonsmokers          <3.9                             Smokers             <  5.6 [employer] Electrochemiluminescence Immunoassay (ECLIA) Values obtained with different assay methods or kits cannot be used interchangeably.   Results cannot be interpreted as absolute evidence of the presence or absence of malignant disease. Performed At: [facility] [place], [place] [id number] Rush Farmer MD [id number]    No results found for: "PSA1" Lab Results  Component Value Date   [id number] 11 10/31/2022   No results found for: "CAN125"  No results found for: "TOTALPROTELP", "ALBUMINELP", "A1GS", "A2GS", "BETS", "BETA2SER", "GAMS", "MSPIKE", "SPEI" Lab Results  Component Value Date   TIBC 457 (H) 01/04/2022   FERRITIN 522 (H) 01/04/2022   IRONPCTSAT 16 (L) 01/04/2022   Lab Results  Component Value Date   LDH 266 (H) 10/05/2021   LDH 242 (H) 09/01/2021     STUDIES:   CT CHEST ABDOMEN PELVIS W CONTRAST  Result Date: 11/23/2022 CLINICAL DATA:  Cholangiocarcinoma with peritoneal carcinomatosis. Restaging. * Tracking Code: BO * EXAM: CT CHEST, ABDOMEN, AND PELVIS WITH CONTRAST TECHNIQUE: Multidetector CT imaging of the chest, abdomen and pelvis was performed following the standard protocol during bolus administration of intravenous contrast. RADIATION DOSE REDUCTION: This exam was performed according to the departmental dose-optimization program which includes automated exposure control, adjustment of the mA and/or kV according to patient size and/or use of iterative reconstruction technique. CONTRAST:  153mL OMNIPAQUE IOHEXOL 300 MG/ML  SOLN COMPARISON:  08/07/2022 FINDINGS: CT CHEST FINDINGS Cardiovascular: The heart size is normal. No substantial pericardial effusion. Coronary artery calcification is evident. Mild atherosclerotic calcification is noted in the wall of the thoracic aorta. Right Port-A-Cath tip is positioned in the distal SVC. Mediastinum/Nodes: No mediastinal lymphadenopathy. No evidence for gross hilar lymphadenopathy although assessment is limited by the lack of intravenous contrast on the current study. There is no hilar lymphadenopathy. The esophagus has normal imaging  features. There is no axillary lymphadenopathy. Lungs/Pleura: No suspicious pulmonary nodule or mass. 2 mm left lower lobe nodule on 95/3 is unchanged. No focal airspace consolidation. No pleural effusion. Musculoskeletal: No worrisome lytic or sclerotic osseous abnormality. CT ABDOMEN PELVIS FINDINGS Hepatobiliary: 1.4 cm subcapsular low-density lesion seen in the left liver along the gallbladder fossa is 1.3 cm today on image 62/2. No new suspicious focal lesion within the liver parenchyma. Gallstones evident with similar appearance of ill-defined gallbladder wall and subtle pericholecystic stranding. No intrahepatic or extrahepatic biliary dilation. Pancreas: No focal mass lesion. No dilatation of the main duct. No intraparenchymal cyst. No peripancreatic edema. Spleen: No splenomegaly. No focal mass lesion. Adrenals/Urinary Tract: No adrenal nodule or mass. Kidneys unremarkable. No evidence for hydroureter. Bladder is nondistended and partially obscured by beam hardening artifact from right hip replacement. Stomach/Bowel: Stomach is unremarkable. No gastric wall thickening. No evidence of outlet obstruction. Duodenum is normally positioned as is the ligament of Treitz. No small bowel wall thickening. No small bowel dilatation. The terminal ileum is normal. The appendix is normal. No gross colonic mass. No colonic wall thickening. Diverticular changes are noted in the left colon without evidence of diverticulitis. Vascular/Lymphatic: There is mild atherosclerotic calcification of the abdominal aorta without aneurysm. There is no gastrohepatic or hepatoduodenal ligament lymphadenopathy. No retroperitoneal or mesenteric lymphadenopathy. Reproductive: The prostate gland and seminal vesicles are unremarkable. Other: No intraperitoneal free fluid. No evidence for peritoneal nodularity. No omental or mesenteric nodularity. Musculoskeletal: Small left groin hernia contains only fat. Tiny fat containing umbilical hernia  evident. No worrisome lytic or sclerotic osseous abnormality. Status post right hip replacement. IMPRESSION: 1. Stable exam. No  new or progressive findings in the chest, abdomen, or pelvis. 2. Stable 1.3 cm subcapsular low-density lesion in the left liver along the gallbladder fossa. 3. Cholelithiasis with similar appearance of ill-defined gallbladder wall and subtle pericholecystic stranding. 4. Left colonic diverticulosis without diverticulitis. 5.  Aortic Atherosclerosis (ICD10-I70.0). Electronically Signed   By: Misty Stanley M.D.   On: 11/23/2022 11:30

## 2022-11-28 ENCOUNTER — Inpatient Hospital Stay: Payer: Medicare Other | Attending: Hematology | Admitting: Hematology

## 2022-11-28 ENCOUNTER — Inpatient Hospital Stay: Payer: Medicare Other

## 2022-11-28 ENCOUNTER — Encounter: Payer: Self-pay | Admitting: Hematology

## 2022-11-28 VITALS — BP 146/81 | HR 78 | Temp 97.0°F | Resp 20 | Wt 284.0 lb

## 2022-11-28 VITALS — BP 116/76 | HR 71 | Temp 97.7°F | Resp 18 | Wt 284.0 lb

## 2022-11-28 DIAGNOSIS — Z5112 Encounter for antineoplastic immunotherapy: Secondary | ICD-10-CM | POA: Insufficient documentation

## 2022-11-28 DIAGNOSIS — G479 Sleep disorder, unspecified: Secondary | ICD-10-CM | POA: Insufficient documentation

## 2022-11-28 DIAGNOSIS — Z79899 Other long term (current) drug therapy: Secondary | ICD-10-CM | POA: Diagnosis not present

## 2022-11-28 DIAGNOSIS — Z95828 Presence of other vascular implants and grafts: Secondary | ICD-10-CM

## 2022-11-28 DIAGNOSIS — C221 Intrahepatic bile duct carcinoma: Secondary | ICD-10-CM | POA: Diagnosis present

## 2022-11-28 DIAGNOSIS — C787 Secondary malignant neoplasm of liver and intrahepatic bile duct: Secondary | ICD-10-CM

## 2022-11-28 DIAGNOSIS — R609 Edema, unspecified: Secondary | ICD-10-CM

## 2022-11-28 DIAGNOSIS — Z8551 Personal history of malignant neoplasm of bladder: Secondary | ICD-10-CM | POA: Insufficient documentation

## 2022-11-28 DIAGNOSIS — G629 Polyneuropathy, unspecified: Secondary | ICD-10-CM | POA: Insufficient documentation

## 2022-11-28 DIAGNOSIS — Z87891 Personal history of nicotine dependence: Secondary | ICD-10-CM | POA: Diagnosis not present

## 2022-11-28 DIAGNOSIS — Z79620 Long term (current) use of immunosuppressive biologic: Secondary | ICD-10-CM | POA: Insufficient documentation

## 2022-11-28 LAB — CBC WITH DIFFERENTIAL/PLATELET
Abs Immature Granulocytes: 0.04 10*3/uL (ref 0.00–0.07)
Basophils Absolute: 0 10*3/uL (ref 0.0–0.1)
Basophils Relative: 1 %
Eosinophils Absolute: 0.2 10*3/uL (ref 0.0–0.5)
Eosinophils Relative: 4 %
HCT: 37.3 % — ABNORMAL LOW (ref 39.0–52.0)
Hemoglobin: 12.2 g/dL — ABNORMAL LOW (ref 13.0–17.0)
Immature Granulocytes: 1 %
Lymphocytes Relative: 23 %
Lymphs Abs: 1.4 10*3/uL (ref 0.7–4.0)
MCH: 30.1 pg (ref 26.0–34.0)
MCHC: 32.7 g/dL (ref 30.0–36.0)
MCV: 92.1 fL (ref 80.0–100.0)
Monocytes Absolute: 0.7 10*3/uL (ref 0.1–1.0)
Monocytes Relative: 12 %
Neutro Abs: 3.6 10*3/uL (ref 1.7–7.7)
Neutrophils Relative %: 59 %
Platelets: 242 10*3/uL (ref 150–400)
RBC: 4.05 MIL/uL — ABNORMAL LOW (ref 4.22–5.81)
RDW: 12.8 % (ref 11.5–15.5)
WBC: 6 10*3/uL (ref 4.0–10.5)
nRBC: 0 % (ref 0.0–0.2)

## 2022-11-28 LAB — COMPREHENSIVE METABOLIC PANEL
ALT: 21 U/L (ref 0–44)
AST: 25 U/L (ref 15–41)
Albumin: 3.6 g/dL (ref 3.5–5.0)
Alkaline Phosphatase: 65 U/L (ref 38–126)
Anion gap: 11 (ref 5–15)
BUN: 37 mg/dL — ABNORMAL HIGH (ref 8–23)
CO2: 27 mmol/L (ref 22–32)
Calcium: 9.2 mg/dL (ref 8.9–10.3)
Chloride: 94 mmol/L — ABNORMAL LOW (ref 98–111)
Creatinine, Ser: 1.45 mg/dL — ABNORMAL HIGH (ref 0.61–1.24)
GFR, Estimated: 53 mL/min — ABNORMAL LOW (ref >60)
Glucose, Bld: 85 mg/dL (ref 70–99)
Potassium: 3.6 mmol/L (ref 3.5–5.1)
Sodium: 132 mmol/L — ABNORMAL LOW (ref 135–145)
Total Bilirubin: 0.8 mg/dL (ref 0.3–1.2)
Total Protein: 6.7 g/dL (ref 6.5–8.1)

## 2022-11-28 LAB — MAGNESIUM: Magnesium: 1.8 mg/dL (ref 1.7–2.4)

## 2022-11-28 LAB — TSH: TSH: 7.825 u[IU]/mL — ABNORMAL HIGH (ref 0.350–4.500)

## 2022-11-28 MED ORDER — HEPARIN SOD (PORK) LOCK FLUSH 100 UNIT/ML IV SOLN
500.0000 [IU] | Freq: Once | INTRAVENOUS | Status: AC | PRN
  Administered 2022-11-28: 500 [IU]

## 2022-11-28 MED ORDER — SODIUM CHLORIDE 0.9 % IV SOLN
1500.0000 mg | Freq: Once | INTRAVENOUS | Status: AC
  Administered 2022-11-28: 1500 mg via INTRAVENOUS
  Filled 2022-11-28: qty 30

## 2022-11-28 MED ORDER — SODIUM CHLORIDE 0.9% FLUSH
10.0000 mL | Freq: Once | INTRAVENOUS | Status: AC
  Administered 2022-11-28: 10 mL via INTRAVENOUS

## 2022-11-28 MED ORDER — BUMETANIDE 2 MG PO TABS: 2.0000 mg | ORAL_TABLET | Freq: Every morning | ORAL | 2 refills | Status: DC

## 2022-11-28 MED ORDER — SODIUM CHLORIDE 0.9% FLUSH
10.0000 mL | INTRAVENOUS | Status: DC | PRN
  Administered 2022-11-28: 10 mL

## 2022-11-28 MED ORDER — SODIUM CHLORIDE 0.9 % IV SOLN: Freq: Once | INTRAVENOUS | Status: AC

## 2022-11-28 NOTE — Patient Instructions (Signed)
[facility] [place]  Discharge Instructions: Thank you for choosing [facility] to provide your oncology and hematology care.  If you have a lab appointment with the Cancer Center, please come in thru the Main Entrance and check in at the main information desk.  Wear comfortable clothing and clothing appropriate for easy access to any Portacath or PICC line.   We strive to give you quality time with your provider. You may need to reschedule your appointment if you arrive late (15 or more minutes).  Arriving late affects you and other patients whose appointments are after yours.  Also, if you miss three or more appointments without notifying the office, you may be dismissed from the clinic at the provider's discretion.      For prescription refill requests, have your pharmacy contact our office and allow 72 hours for refills to be completed.    Today you received the following chemotherapy and/or immunotherapy agents Imfinzi   To help prevent nausea and vomiting after your treatment, we encourage you to take your nausea medication as directed.  Durvalumab Injection What is this medication? DURVALUMAB (dur VAL ue mab) treats some types of cancer. It works by helping your immune system slow or stop the spread of cancer cells. It is a monoclonal antibody. This medicine may be used for other purposes; ask your health care provider or pharmacist if you have questions. COMMON BRAND NAME(S): IMFINZI What should I tell my care team before I take this medication? They need to know if you have any of these conditions: Allogeneic stem cell transplant (uses someone else's stem cells) Autoimmune diseases, such as Crohn disease, ulcerative colitis, lupus History of chest radiation Nervous system problems, such as Guillain-Barre syndrome, myasthenia gravis Organ transplant An unusual or allergic reaction to durvalumab, other medications, foods, dyes, or preservatives Pregnant  or trying to get pregnant Breast-feeding How should I use this medication? This medication is infused into a vein. It is given by your care team in a hospital or clinic setting. A special MedGuide will be given to you before each treatment. Be sure to read this information carefully each time. Talk to your care team about the use of this medication in children. Special care may be needed. Overdosage: If you think you have taken too much of this medicine contact a poison control center or emergency room at once. NOTE: This medicine is only for you. Do not share this medicine with others. What if I miss a dose? Keep appointments for follow-up doses. It is important not to miss your dose. Call your care team if you are unable to keep an appointment. What may interact with this medication? Interactions have not been studied. This list may not describe all possible interactions. Give your health care provider a list of all the medicines, herbs, non-prescription drugs, or dietary supplements you use. Also tell them if you smoke, drink alcohol, or use illegal drugs. Some items may interact with your medicine. What should I watch for while using this medication? Your condition will be monitored carefully while you are receiving this medication. You may need blood work while taking this medication. This medication may cause serious skin reactions. They can happen weeks to months after starting the medication. Contact your care team right away if you notice fevers or flu-like symptoms with a rash. The rash may be red or purple and then turn into blisters or peeling of the skin. You may also notice a red rash with   swelling of the face, lips, or lymph nodes in your neck or under your arms. Tell your care team right away if you have any change in your eyesight. Talk to your care team if you may be pregnant. Serious birth defects can occur if you take this medication during pregnancy and for 3 months after the  last dose. You will need a negative pregnancy test before starting this medication. Contraception is recommended while taking this medication and for 3 months after the last dose. Your care team can help you find the option that works for you. Do not breastfeed while taking this medication and for 3 months after the last dose. What side effects may I notice from receiving this medication? Side effects that you should report to your care team as soon as possible: Allergic reactions--skin rash, itching, hives, swelling of the face, lips, tongue, or throat Dry cough, shortness of breath or trouble breathing Eye pain, redness, irritation, or discharge with blurry or decreased vision Heart muscle inflammation--unusual weakness or fatigue, shortness of breath, chest pain, fast or irregular heartbeat, dizziness, swelling of the ankles, feet, or hands Hormone gland problems--headache, sensitivity to light, unusual weakness or fatigue, dizziness, fast or irregular heartbeat, increased sensitivity to cold or heat, excessive sweating, constipation, hair loss, increased thirst or amount of urine, tremors or shaking, irritability Infusion reactions--chest pain, shortness of breath or trouble breathing, feeling faint or lightheaded Kidney injury (glomerulonephritis)--decrease in the amount of urine, red or dark brown urine, foamy or bubbly urine, swelling of the ankles, hands, or feet Liver injury--right upper belly pain, loss of appetite, nausea, light-colored stool, dark yellow or brown urine, yellowing skin or eyes, unusual weakness or fatigue Pain, tingling, or numbness in the hands or feet, muscle weakness, change in vision, confusion or trouble speaking, loss of balance or coordination, trouble walking, seizures Rash, fever, and swollen lymph nodes Redness, blistering, peeling, or loosening of the skin, including inside the mouth Sudden or severe stomach pain, bloody diarrhea, fever, nausea, vomiting Side  effects that usually do not require medical attention (report these to your care team if they continue or are bothersome): Bone, joint, or muscle pain Diarrhea Fatigue Loss of appetite Nausea Skin rash This list may not describe all possible side effects. Call your doctor for medical advice about side effects. You may report side effects to FDA at 1-800-FDA-1088. Where should I keep my medication? This medication is given in a hospital or clinic. It will not be stored at home. NOTE: This sheet is a summary. It may not cover all possible information. If you have questions about this medicine, talk to your doctor, pharmacist, or health care provider.  2023 Elsevier/Gold Standard (2021-12-30 00:00:00)   BELOW ARE SYMPTOMS THAT SHOULD BE REPORTED IMMEDIATELY: *FEVER GREATER THAN 100.4 F (38 C) OR HIGHER *CHILLS OR SWEATING *NAUSEA AND VOMITING THAT IS NOT CONTROLLED WITH YOUR NAUSEA MEDICATION *UNUSUAL SHORTNESS OF BREATH *UNUSUAL BRUISING OR BLEEDING *URINARY PROBLEMS (pain or burning when urinating, or frequent urination) *BOWEL PROBLEMS (unusual diarrhea, constipation, pain near the anus) TENDERNESS IN MOUTH AND THROAT WITH OR WITHOUT PRESENCE OF ULCERS (sore throat, sores in mouth, or a toothache) UNUSUAL RASH, SWELLING OR PAIN  UNUSUAL VAGINAL DISCHARGE OR ITCHING   Items with * indicate a potential emergency and should be followed up as soon as possible or go to the Emergency Department if any problems should occur.  Please show the CHEMOTHERAPY ALERT CARD or IMMUNOTHERAPY ALERT CARD at check-in to the Emergency Department and   triage nurse.  Should you have questions after your visit or need to cancel or reschedule your appointment, please contact [facility] [phone]  and follow the prompts.  Office hours are 8:00 a.m. to 4:30 p.m. Monday - Friday. Please note that voicemails left after 4:00 p.m. may not be returned until the following business day.  We  are closed weekends and major holidays. You have access to a nurse at all times for urgent questions. Please call the main number to the clinic [phone] and follow the prompts.  For any non-urgent questions, you may also contact your provider using MyChart. We now offer e-Visits for anyone 18 and older to request care online for non-urgent symptoms. For details visit [email or web address].   Also download the MyChart app! Go to the app store, search "MyChart", open the app, select [place], and log in with your MyChart username and password.   

## 2022-11-28 NOTE — Progress Notes (Signed)
Patient presents today for Imfinzi infusion.  Patient is in satisfactory condition with no new complaints voiced.  Vital signs are stable.  Labs reviewed and all labs are within treatment parameters.  We will proceed with treatment per MD orders.    Imfinizi given today per MD orders. Tolerated infusion without adverse affects. Vital signs stable. No complaints at this time. Discharged from clinic ambulatory in stable condition. Alert and oriented x 3. F/U with [facility] as scheduled.

## 2022-11-28 NOTE — Progress Notes (Signed)
Patient has been examined by Dr. Katragadda. Vital signs and labs have been reviewed by MD - ANC, Creatinine, LFTs, hemoglobin, and platelets are within treatment parameters per M.D. - pt may proceed with treatment.  Primary RN and pharmacy notified.  

## 2022-11-28 NOTE — Patient Instructions (Addendum)
[place] at [facility] Discharge Instructions   You were seen and examined today by Dr. Delton Coombes.  He reviewed the results of your CT scan which was stable. No new evidence of cancer was seen.   He reviewed the results of your lab work which are normal/stable.   We will proceed with your treatment today.   We will increase Bumex to 2 mg in the morning daily.   Return as scheduled.    Thank you for choosing [place] at [facility] to provide your oncology and hematology care.  To afford each patient quality time with our provider, please arrive at least 15 minutes before your scheduled appointment time.   If you have a lab appointment with the [facility] please come in thru the Main Entrance and check in at the main information desk.  You need to re-schedule your appointment should you arrive 10 or more minutes late.  We strive to give you quality time with our providers, and arriving late affects you and other patients whose appointments are after yours.  Also, if you no show three or more times for appointments you may be dismissed from the clinic at the providers discretion.     Again, thank you for choosing [facility].  Our hope is that these requests will decrease the amount of time that you wait before being seen by our physicians.       _____________________________________________________________  Should you have questions after your visit to [facility], please contact our office at [phone] and follow the prompts.  Our office hours are 8:00 a.m. and 4:30 p.m. Monday - Friday.  Please note that voicemails left after 4:00 p.m. may not be returned until the following business day.  We are closed weekends and major holidays.  You do have access to a nurse 24-7, just call the main number to the clinic [phone] and do not press any options, hold on the line and a nurse will answer the phone.     For prescription refill requests, have your pharmacy contact our office and allow 72 hours.    Due to Covid, you will need to wear a mask upon entering the hospital. If you do not have a mask, a mask will be given to you at the Main Entrance upon arrival. For doctor visits, patients may have 1 support person age 49 or older with them. For treatment visits, patients can not have anyone with them due to social distancing guidelines and our immunocompromised population.

## 2022-12-11 ENCOUNTER — Other Ambulatory Visit: Payer: Self-pay | Admitting: Hematology

## 2022-12-11 ENCOUNTER — Other Ambulatory Visit: Payer: Self-pay | Admitting: *Deleted

## 2022-12-11 MED ORDER — BUMETANIDE 2 MG PO TABS: 2.0000 mg | ORAL_TABLET | Freq: Every morning | ORAL | 2 refills | Status: DC

## 2022-12-12 ENCOUNTER — Other Ambulatory Visit: Payer: Self-pay

## 2022-12-25 NOTE — Progress Notes (Signed)
[facility] [address], [place] [address]    Clinic Day:  12/26/2022  Referring physician: Benita Stabile, MD  Patient Care Team: Benita Stabile, MD as PCP - General (Internal Medicine) Jonelle Sidle, MD as Consulting Physician (Cardiology) Doreatha Massed, MD as Medical Oncologist (Medical Oncology)   ASSESSMENT & PLAN:   Assessment: Liver lesion/peritoneal carcinomatosis: - Patient seen at the request of Dr. [name] - Reported pressure on the sides of the abdomen with slight pain for the last 1 month.  Marvin Carr also reported pain in the epigastric region. - Marvin Carr had lost 20 pounds in the last 3 to 4 months intentionally, cutting back on sugars.  Marvin Carr was also started on semaglutide. - Colonoscopy on 06/28/2011 with a benign polypoid colonic mucosa in the descending colon.  No evidence of malignancy. - MRI of the brain was negative. - EGD and colonoscopy on 09/23/2018 did not reveal any malignancies. 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  IHC positive for CK7, CDX2 and negative for GATA3.  Findings suggestive of upper GI or pancreaticobiliary primary. - Cycle 1 of gemcitabine, cisplatin and durvalumab started on 10/05/2021. - NGS: No targetable mutations.  PD-L1 (UE454) negative.  MSI-stable.  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   Social/family history: - Marvin Carr lives at home by himself.  Marvin Carr records voiceover/narrations. - Non-smoker. - Father died of MDS.  Paternal grandfather had prostate cancer.  Maternal grandfather had cancer.  3.  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 Laverle Patter.  Reportedly received 1 treatment of intravesical chemo and has been on surveillance since then.  Last surveillance visit was in 2020.  Plan: Cholangiocarcinoma with peritoneal carcinomatosis: - CT CAP on 11/21/2022: Stable exam with no progressive findings.  Stable 1.3 cm left liver lesion. - Labs  today: Normal LFTs.  Creatinine 1.63 and potassium 2.9.  CBC was grossly normal.  TSH is 3.6. - We will replete potassium. - Marvin Carr reports dry cough for the past 4 weeks.  Marvin Carr took Delsym 2 bottles which did not help.  Marvin Carr is taking Robitussin for the last 2 times which seem to help. - Recommend holding treatment today. - Recommend CT chest without contrast to evaluate for pneumonitis. - Will replete potassium today with K-Dur 40 mEq x 1. - Will give Tessalon Perles 100 mg 3 times daily for 7 days.  RTC 1 week for follow-up.   Fluid retention: - Marvin Carr ran out of Bumex 1 week ago.  Marvin Carr is using metolazone now 5 mg 3 times weekly. - Marvin Carr will restart Bumex 2 mg daily.  3.  Neuropathy/"drawing" of legs: - Continue Mirapex 0.75 mg 3 times daily.  4.  Sleeping difficulty: - Continue Lunesta as needed.  5.  Hypomagnesemia: - Continue magnesium 3 times daily.  Magnesium is normal.  Orders Placed This Encounter  Procedures   CT Chest Wo Contrast    Standing Status:   Future    Standing Expiration Date:   12/26/2023    Order Specific Question:   Preferred imaging location?    Answer:   [facility]      I,Alexis Herring,acting as a scribe for Doreatha Massed, MD.,have documented all relevant documentation on the behalf of Doreatha Massed, MD,as directed by  Doreatha Massed, MD while in the presence of Doreatha Massed, MD.  I, Doreatha Massed MD, have reviewed the above documentation for accuracy and completeness, and I agree with the above.  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 male presenting to clinic today for follow up of cholangiocarcinoma and peritoneal carcinomatosis. Marvin Carr was last seen by me on 11/28/22.  Today, Marvin Carr states that Marvin Carr is doing well overall. His appetite level is at 100%. His energy level is at 75%.   PAST MEDICAL HISTORY:   Past Medical History: Past Medical History:  Diagnosis Date   Anxiety    Bladder cancer (HCC) 09/11/2009   CAD (coronary artery disease), native coronary artery    a. Mildly elevated troponin 03/2013, cath with nonobstructive disease including 50% AV groove distal stenosis before large OM   Essential hypertension    Headache(784.0)    History of migraines   Hyperglycemia    Mixed hyperlipidemia    Neuropathy    Obesity    Port-A-Cath in place 09/30/2021   Pre-diabetes    Seasonal allergies    Sleep apnea    On CPAP    Surgical History: Past Surgical History:  Procedure Laterality Date   BIOPSY  09/23/2021   Procedure: BIOPSY;  Surgeon: Dolores Frame, MD;  [facility];  Service: Gastroenterology;;   COLONOSCOPY  06/28/2011   Procedure: COLONOSCOPY;  Surgeon: Malissa Hippo, MD;  [facility];  Service: Endoscopy;  Laterality: N/A;   COLONOSCOPY WITH PROPOFOL N/A 09/23/2021   Procedure: COLONOSCOPY WITH PROPOFOL;  Surgeon: Dolores Frame, MD;  [facility];  Service: Gastroenterology;  Laterality: N/A;  940   ESOPHAGOGASTRODUODENOSCOPY (EGD) WITH PROPOFOL N/A 09/23/2021   Procedure: ESOPHAGOGASTRODUODENOSCOPY (EGD)  WITH PROPOFOL;  Surgeon: Dolores Frame, MD;  [facility];  Service: Gastroenterology;  Laterality: N/A;   IR IMAGING GUIDED PORT INSERTION  09/28/2021   IR PARACENTESIS  09/28/2021   JOINT REPLACEMENT Right    hip   LEFT HEART CATHETERIZATION WITH CORONARY ANGIOGRAM N/A 03/31/2013   Procedure: LEFT HEART CATHETERIZATION WITH CORONARY ANGIOGRAM;  Surgeon: [name], MD;  Location: [facility] CATH [facility];  Service: Cardiovascular;  Laterality: N/A;   POLYPECTOMY  09/23/2021   Procedure: POLYPECTOMY;  Surgeon: Dolores Frame, MD;  [facility];  Service: Gastroenterology;;   TURBT  09/11/2009    Social History: Social History   Socioeconomic History   Marital status: Divorced    Spouse name: Not on file   Number of children: 0   Years of education: college   Highest education level: Not on file  Occupational History    Employer: SELF-EMPLOYED  Tobacco Use   Smoking status: Former    Packs/day: 0.50    Years: 10.00    Additional pack years: 0.00    Total  pack years: 5.00    Types: Cigarettes    Quit date: 07/10/2011    Years since quitting: 11.4   Smokeless tobacco: Never   Tobacco comments:    Quit several yrs prior to 03/2013.  Vaping Use   Vaping Use: Never used  Substance and Sexual Activity   Alcohol use: Yes    Alcohol/week: 0.0 standard drinks of alcohol    Comment: Occasional   Drug use: No   Sexual activity: Not on file  Other Topics Concern   Not on file  Social History Narrative   Not on file   Social Determinants of Health   Financial Resource Strain: Not on file  Food Insecurity: No Food Insecurity (06/05/2022)   Hunger Vital Sign    Worried About Running Out of Food in the Last Year: Never true    Ran Out of Food in the Last Year: Never true  Transportation Needs: No Transportation Needs (06/05/2022)   PRAPARE - Administrator, Civil Service (Medical): No    Lack of Transportation (Non-Medical): No   Physical Activity: Not on file  Stress: Not on file  Social Connections: Not on file  Intimate Partner Violence: Not At Risk (06/05/2022)   Humiliation, Afraid, Rape, and Kick questionnaire    Fear of Current or Ex-Partner: No    Emotionally Abused: No    Physically Abused: No    Sexually Abused: No    Family History: Family History  Problem Relation Age of Onset   COPD Father     Current Medications:  Current Outpatient Medications:    amLODipine (NORVASC) 5 MG tablet, Take 5 mg by mouth daily., Disp: , Rfl:    aspirin EC 81 MG tablet, Take 81 mg by mouth daily., Disp: , Rfl:    benzonatate (TESSALON) 100 MG capsule, Take 1 capsule (100 mg total) by mouth 3 (three) times daily., Disp: 21 capsule, Rfl: 0   bumetanide (BUMEX) 2 MG tablet, Take 1 tablet (2 mg total) by mouth in the morning., Disp: 30 tablet, Rfl: 2   buPROPion (WELLBUTRIN XL) 300 MG 24 hr tablet, Take 300 mg by mouth daily., Disp: , Rfl:    CISPLATIN IV, Inject into the vein once a week. Day 1, Day 8 q 21 days Every other week., Disp: , Rfl:    cyclobenzaprine (FLEXERIL) 10 MG tablet, Take 10 mg by mouth at bedtime as needed for muscle spasms., Disp: , Rfl:    Durvalumab (IMFINZI IV), Inject into the vein every 21 ( twenty-one) days., Disp: , Rfl:    Elastic Bandages & Supports (ELASTIC BANDAGE 6") MISC, Use compression wraps during the day for leg swelling, Disp: 4 each, Rfl: 0   Eszopiclone 3 MG TABS, Take 3 mg by mouth at bedtime. Take immediately before bedtime, Disp: , Rfl:    famotidine (PEPCID) 20 MG tablet, TAKE (1) TABLET BY MOUTH AT BEDTIME., Disp: 30 tablet, Rfl: 0   fenofibrate 160 MG tablet, Take 160 mg by mouth daily., Disp: , Rfl:    FLUoxetine (PROZAC) 10 MG capsule, Take 10 mg by mouth daily., Disp: , Rfl:    furosemide (LASIX) 40 MG tablet, Take 1 tablet (40 mg total) by mouth daily., Disp: 30 tablet, Rfl: 1   gabapentin (NEURONTIN) 300 MG capsule, Take 1 capsule (300 mg total) by mouth 3 (three)  times daily., Disp: 90 capsule, Rfl: 3   Gauze Pads & Dressings (ABDOMINAL PAD) 8"X10" PADS, Use for leaking fluid from leg swelling,  Disp: 360 each, Rfl: 0   Gemcitabine HCl (GEMZAR IV), Inject into the vein once a week. Day 1, Day 8 q 21 days Every other week, Disp: , Rfl:    magnesium oxide (MAG-OX) 400 (240 Mg) MG tablet, Take 1 tablet (400 mg total) by mouth 2 (two) times daily. (Patient taking differently: Take 400 mg by mouth 3 (three) times daily.), Disp: 90 tablet, Rfl: 6   metFORMIN (GLUCOPHAGE-XR) 500 MG 24 hr tablet, Take 500 mg by mouth 2 (two) times daily., Disp: , Rfl:    metolazone (ZAROXOLYN) 5 MG tablet, Take 1 tablet (5 mg total) by mouth 3 (three) times a week., Disp: 30 tablet, Rfl: 2   olmesartan (BENICAR) 40 MG tablet, TAKE (1) TABLET BY MOUTH ONCE DAILY., Disp: 30 tablet, Rfl: 0   omeprazole (PRILOSEC) 40 MG capsule, Take 40 mg by mouth daily as needed (reflux)., Disp: , Rfl:    pantoprazole (PROTONIX) 40 MG tablet, Take 1 tablet (40 mg total) by mouth daily. (Patient taking differently: Take 40 mg by mouth daily as needed (reflux).), Disp: 90 tablet, Rfl: 3   pramipexole (MIRAPEX) 0.75 MG tablet, Take 1 tablet (0.75 mg total) by mouth 3 (three) times daily., Disp: 90 tablet, Rfl: 0   RYBELSUS 7 MG TABS, Take 14 mg by mouth daily., Disp: , Rfl:    tamsulosin (FLOMAX) 0.4 MG CAPS capsule, Take 0.4 mg by mouth daily., Disp: , Rfl:    traMADol (ULTRAM) 50 MG tablet, Take 50 mg by mouth 4 (four) times daily as needed for moderate pain., Disp: , Rfl:    zaleplon (SONATA) 10 MG capsule, Take 10 mg by mouth at bedtime as needed., Disp: , Rfl:    nitroGLYCERIN (NITROSTAT) 0.4 MG SL tablet, Place 1 tablet (0.4 mg total) under the tongue every 5 (five) minutes as needed for chest pain. (Patient not taking: Reported on 12/26/2022), Disp: 25 tablet, Rfl: 3 No current facility-administered medications for this visit.  Facility-Administered Medications Ordered in Other Visits:    magnesium  sulfate 2 GM/50ML IVPB, , , ,    Allergies: No Known Allergies  REVIEW OF SYSTEMS:   Review of Systems  Constitutional:  Negative for chills, fatigue and fever.  HENT:   Negative for lump/mass, mouth sores, nosebleeds, sore throat and trouble swallowing.   Eyes:  Negative for eye problems.  Respiratory:  Positive for cough. Negative for shortness of breath.   Cardiovascular:  Negative for chest pain, leg swelling and palpitations.  Gastrointestinal:  Negative for abdominal pain, constipation, diarrhea, nausea and vomiting.  Genitourinary:  Negative for bladder incontinence, difficulty urinating, dysuria, frequency, hematuria and nocturia.   Musculoskeletal:  Negative for arthralgias, back pain, flank pain, myalgias and neck pain.  Skin:  Negative for itching and rash.  Neurological:  Negative for dizziness, headaches and numbness.  Hematological:  Does not bruise/bleed easily.  Psychiatric/Behavioral:  Negative for depression, sleep disturbance and suicidal ideas. The patient is not nervous/anxious.   All other systems reviewed and are negative.    VITALS:   There were no vitals taken for this visit.  Wt Readings from Last 3 Encounters:  12/26/22 279 lb 6.4 oz (126.7 kg)  11/28/22 284 lb (128.8 kg)  11/28/22 284 lb (128.8 kg)    There is no height or weight on file to calculate BMI.  Performance status (ECOG): 1 - Symptomatic but completely ambulatory  PHYSICAL EXAM:   Physical Exam Vitals and nursing note reviewed. Exam conducted with a chaperone present.  Constitutional:      Appearance: Normal appearance.  Cardiovascular:     Rate and Rhythm: Normal rate and regular rhythm.     Pulses: Normal pulses.     Heart sounds: Normal heart sounds.  Pulmonary:     Effort: Pulmonary effort is normal.     Breath sounds: Normal breath sounds.  Abdominal:     Palpations: Abdomen is soft. There is no hepatomegaly, splenomegaly or mass.     Tenderness: There is no abdominal  tenderness.  Musculoskeletal:     Right lower leg: No edema.     Left lower leg: No edema.  Lymphadenopathy:     Cervical: No cervical adenopathy.     Right cervical: No superficial, deep or posterior cervical adenopathy.    Left cervical: No superficial, deep or posterior cervical adenopathy.     Upper Body:     Right upper body: No supraclavicular or axillary adenopathy.     Left upper body: No supraclavicular or axillary adenopathy.  Neurological:     General: No focal deficit present.     Mental Status: Marvin Carr is alert and oriented to person, place, and time.  Psychiatric:        Mood and Affect: Mood normal.        Behavior: Behavior normal.     LABS:      Latest Ref Rng & Units 12/26/2022    8:23 AM 11/28/2022    9:45 AM 10/31/2022    9:31 AM  CBC  WBC 4.0 - 10.5 K/uL 7.2  6.0  5.6   Hemoglobin 13.0 - 17.0 g/dL 16.1  09.6  04.5   Hematocrit 39.0 - 52.0 % 37.7  37.3  36.2   Platelets 150 - 400 K/uL 227  242  238       Latest Ref Rng & Units 12/26/2022    8:23 AM 11/28/2022    9:45 AM 10/31/2022    9:31 AM  CMP  Glucose 70 - 99 mg/dL 86  85  409   BUN 8 - 23 mg/dL 50  37  35   Creatinine 0.61 - 1.24 mg/dL 8.11  9.14  7.82   Sodium 135 - 145 mmol/L 132  132  135   Potassium 3.5 - 5.1 mmol/L 2.9  3.6  3.9   Chloride 98 - 111 mmol/L 94  94  103   CO2 22 - 32 mmol/L 32  27  25   Calcium 8.9 - 10.3 mg/dL 8.8  9.2  9.1   Total Protein 6.5 - 8.1 g/dL 6.6  6.7  6.7   Total Bilirubin 0.3 - 1.2 mg/dL 0.6  0.8  0.5   Alkaline Phos 38 - 126 U/L 62  65  70   AST 15 - 41 U/L 27  25  23    ALT 0 - 44 U/L 22  21  20       Lab Results  Component Value Date   CEA1 2.8 10/31/2022   /  CEA  Date Value Ref Range Status  10/31/2022 2.8 0.0 - 4.7 ng/mL Final    Comment:    (NOTE)                             Nonsmokers          <3.9  Smokers             <5.6 Roche [employer] Electrochemiluminescence Immunoassay (ECLIA) Values obtained with different  assay methods or kits cannot be used interchangeably.  Results cannot be interpreted as absolute evidence of the presence or absence of malignant disease. Performed At: [facility] [address] [place], [place] [id number] Jolene Schimke MD ZD:[id number]    No results found for: "PSA1" Lab Results  Component Value Date   VZD638 11 10/31/2022   No results found for: "CAN125"  No results found for: "TOTALPROTELP", "ALBUMINELP", "A1GS", "A2GS", "BETS", "BETA2SER", "GAMS", "MSPIKE", "SPEI" Lab Results  Component Value Date   TIBC 457 (H) 01/04/2022   FERRITIN 522 (H) 01/04/2022   IRONPCTSAT 16 (L) 01/04/2022   Lab Results  Component Value Date   LDH 266 (H) 10/05/2021   LDH 242 (H) 09/01/2021     STUDIES:   No results found.

## 2022-12-26 ENCOUNTER — Inpatient Hospital Stay: Payer: Medicare Other

## 2022-12-26 ENCOUNTER — Inpatient Hospital Stay: Payer: Medicare Other | Attending: Hematology | Admitting: Hematology

## 2022-12-26 DIAGNOSIS — Z95828 Presence of other vascular implants and grafts: Secondary | ICD-10-CM

## 2022-12-26 DIAGNOSIS — R0602 Shortness of breath: Secondary | ICD-10-CM | POA: Diagnosis not present

## 2022-12-26 DIAGNOSIS — Z87891 Personal history of nicotine dependence: Secondary | ICD-10-CM | POA: Insufficient documentation

## 2022-12-26 DIAGNOSIS — Z79899 Other long term (current) drug therapy: Secondary | ICD-10-CM | POA: Insufficient documentation

## 2022-12-26 DIAGNOSIS — C221 Intrahepatic bile duct carcinoma: Secondary | ICD-10-CM

## 2022-12-26 DIAGNOSIS — R059 Cough, unspecified: Secondary | ICD-10-CM | POA: Diagnosis not present

## 2022-12-26 DIAGNOSIS — C787 Secondary malignant neoplasm of liver and intrahepatic bile duct: Secondary | ICD-10-CM

## 2022-12-26 DIAGNOSIS — G629 Polyneuropathy, unspecified: Secondary | ICD-10-CM | POA: Insufficient documentation

## 2022-12-26 DIAGNOSIS — G479 Sleep disorder, unspecified: Secondary | ICD-10-CM | POA: Diagnosis not present

## 2022-12-26 DIAGNOSIS — E876 Hypokalemia: Secondary | ICD-10-CM

## 2022-12-26 DIAGNOSIS — R609 Edema, unspecified: Secondary | ICD-10-CM | POA: Diagnosis not present

## 2022-12-26 DIAGNOSIS — Z8551 Personal history of malignant neoplasm of bladder: Secondary | ICD-10-CM | POA: Diagnosis not present

## 2022-12-26 DIAGNOSIS — C786 Secondary malignant neoplasm of retroperitoneum and peritoneum: Secondary | ICD-10-CM | POA: Diagnosis not present

## 2022-12-26 LAB — CBC WITH DIFFERENTIAL/PLATELET
Abs Immature Granulocytes: 0.03 10*3/uL (ref 0.00–0.07)
Basophils Absolute: 0 10*3/uL (ref 0.0–0.1)
Basophils Relative: 0 %
Eosinophils Absolute: 0.3 10*3/uL (ref 0.0–0.5)
Eosinophils Relative: 5 %
HCT: 37.7 % — ABNORMAL LOW (ref 39.0–52.0)
Hemoglobin: 12.6 g/dL — ABNORMAL LOW (ref 13.0–17.0)
Immature Granulocytes: 0 %
Lymphocytes Relative: 11 %
Lymphs Abs: 0.8 10*3/uL (ref 0.7–4.0)
MCH: 30.6 pg (ref 26.0–34.0)
MCHC: 33.4 g/dL (ref 30.0–36.0)
MCV: 91.5 fL (ref 80.0–100.0)
Monocytes Absolute: 1 10*3/uL (ref 0.1–1.0)
Monocytes Relative: 14 %
Neutro Abs: 5 10*3/uL (ref 1.7–7.7)
Neutrophils Relative %: 70 %
Platelets: 227 10*3/uL (ref 150–400)
RBC: 4.12 MIL/uL — ABNORMAL LOW (ref 4.22–5.81)
RDW: 13.5 % (ref 11.5–15.5)
WBC: 7.2 10*3/uL (ref 4.0–10.5)
nRBC: 0 % (ref 0.0–0.2)

## 2022-12-26 LAB — TSH: TSH: 3.673 u[IU]/mL (ref 0.350–4.500)

## 2022-12-26 LAB — COMPREHENSIVE METABOLIC PANEL
ALT: 22 U/L (ref 0–44)
AST: 27 U/L (ref 15–41)
Albumin: 3.5 g/dL (ref 3.5–5.0)
Alkaline Phosphatase: 62 U/L (ref 38–126)
Anion gap: 6 (ref 5–15)
BUN: 50 mg/dL — ABNORMAL HIGH (ref 8–23)
CO2: 32 mmol/L (ref 22–32)
Calcium: 8.8 mg/dL — ABNORMAL LOW (ref 8.9–10.3)
Chloride: 94 mmol/L — ABNORMAL LOW (ref 98–111)
Creatinine, Ser: 1.63 mg/dL — ABNORMAL HIGH (ref 0.61–1.24)
GFR, Estimated: 46 mL/min — ABNORMAL LOW (ref >60)
Glucose, Bld: 86 mg/dL (ref 70–99)
Potassium: 2.9 mmol/L — ABNORMAL LOW (ref 3.5–5.1)
Sodium: 132 mmol/L — ABNORMAL LOW (ref 135–145)
Total Bilirubin: 0.6 mg/dL (ref 0.3–1.2)
Total Protein: 6.6 g/dL (ref 6.5–8.1)

## 2022-12-26 LAB — MAGNESIUM: Magnesium: 2 mg/dL (ref 1.7–2.4)

## 2022-12-26 MED ORDER — BENZONATATE 100 MG PO CAPS: 100.0000 mg | ORAL_CAPSULE | Freq: Three times a day (TID) | ORAL | 0 refills | Status: DC

## 2022-12-26 MED ORDER — HEPARIN SOD (PORK) LOCK FLUSH 100 UNIT/ML IV SOLN
500.0000 [IU] | Freq: Once | INTRAVENOUS | Status: AC
  Administered 2022-12-26: 500 [IU] via INTRAVENOUS

## 2022-12-26 MED ORDER — POTASSIUM CHLORIDE CRYS ER 20 MEQ PO TBCR
40.0000 meq | EXTENDED_RELEASE_TABLET | Freq: Once | ORAL | Status: AC
  Administered 2022-12-26: 40 meq via ORAL
  Filled 2022-12-26: qty 2

## 2022-12-26 MED ORDER — SODIUM CHLORIDE 0.9% FLUSH
10.0000 mL | INTRAVENOUS | Status: DC | PRN
  Administered 2022-12-26: 10 mL via INTRAVENOUS

## 2022-12-26 MED ORDER — SODIUM CHLORIDE 0.9% FLUSH
10.0000 mL | Freq: Once | INTRAVENOUS | Status: AC
  Administered 2022-12-26: 10 mL via INTRAVENOUS

## 2022-12-26 NOTE — Patient Instructions (Addendum)
[facility] at [facility] Discharge Instructions   You were seen and examined today by Dr. Ellin Saba.  He reviewed the results of your lab work which are mostly normal/stable. Your potassium is low at 2.9. Your kidney function (creatinine) is elevated at 1.63. We will give potassium pills to help correct your potassium level.  We will hold your treatment today. We will get a CT scan of your chest to rule out pneumonitis.   Dr. Kirtland Bouchard sent a prescription for [facility] to help with your cough. Take as prescribed.   Return as scheduled.    Thank you for choosing [facility] at [facility] to provide your oncology and hematology care.  To afford each patient quality time with our provider, please arrive at least 15 minutes before your scheduled appointment time.   If you have a lab appointment with the Cancer Center please come in thru the Main Entrance and check in at the main information desk.  You need to re-schedule your appointment should you arrive 10 or more minutes late.  We strive to give you quality time with our providers, and arriving late affects you and other patients whose appointments are after yours.  Also, if you no show three or more times for appointments you may be dismissed from the clinic at the providers discretion.     Again, thank you for choosing [facility].  Our hope is that these requests will decrease the amount of time that you wait before being seen by our physicians.       _____________________________________________________________  Should you have questions after your visit to [facility], please contact our office at [phone] and follow the prompts.  Our office hours are 8:00 a.m. and 4:30 p.m. Monday - Friday.  Please note that voicemails left after 4:00 p.m. may not be returned until the following business day.  We are closed weekends and major holidays.  You do have access to a  nurse 24-7, just call the main number to the clinic [phone] and do not press any options, hold on the line and a nurse will answer the phone.    For prescription refill requests, have your pharmacy contact our office and allow 72 hours.    Due to Covid, you will need to wear a mask upon entering the hospital. If you do not have a mask, a mask will be given to you at the Main Entrance upon arrival. For doctor visits, patients may have 1 support person age 27 or older with them. For treatment visits, patients can not have anyone with them due to social distancing guidelines and our immunocompromised population.

## 2022-12-26 NOTE — Progress Notes (Signed)
Hold treatment today per Dr Ellin Saba due to possible pneumonitis. Potassium today is 2.9, will give Klor-Con PO x1 dose per MD. Patients port flushed without difficulty. Good blood return noted with no bruising or swelling noted at site.  Band aid applied.  VSS with discharge and left in satisfactory condition with no s/s of distress noted.

## 2022-12-26 NOTE — Patient Instructions (Signed)
[facility] [place]  Discharge Instructions: Thank you for choosing [facility] to provide your oncology and hematology care.  If you have a lab appointment with the Cancer Center - please note that after April 8th, 2024, all labs will be drawn in the cancer center.  You do not have to check in or register with the main entrance as you have in the past but will complete your check-in in the cancer center.  Wear comfortable clothing and clothing appropriate for easy access to any Portacath or PICC line.   We strive to give you quality time with your provider. You may need to reschedule your appointment if you arrive late (15 or more minutes).  Arriving late affects you and other patients whose appointments are after yours.  Also, if you miss three or more appointments without notifying the office, you may be dismissed from the clinic at the provider's discretion.      For prescription refill requests, have your pharmacy contact our office and allow 72 hours for refills to be completed.  To help prevent nausea and vomiting after your treatment, we encourage you to take your nausea medication as directed.  BELOW ARE SYMPTOMS THAT SHOULD BE REPORTED IMMEDIATELY: *FEVER GREATER THAN 100.4 F (38 C) OR HIGHER *CHILLS OR SWEATING *NAUSEA AND VOMITING THAT IS NOT CONTROLLED WITH YOUR NAUSEA MEDICATION *UNUSUAL SHORTNESS OF BREATH *UNUSUAL BRUISING OR BLEEDING *URINARY PROBLEMS (pain or burning when urinating, or frequent urination) *BOWEL PROBLEMS (unusual diarrhea, constipation, pain near the anus) TENDERNESS IN MOUTH AND THROAT WITH OR WITHOUT PRESENCE OF ULCERS (sore throat, sores in mouth, or a toothache) UNUSUAL RASH, SWELLING OR PAIN  UNUSUAL VAGINAL DISCHARGE OR ITCHING   Items with * indicate a potential emergency and should be followed up as soon as possible or go to the Emergency Department if any problems should occur.  Please show the CHEMOTHERAPY ALERT CARD or  IMMUNOTHERAPY ALERT CARD at check-in to the Emergency Department and triage nurse.  Should you have questions after your visit or need to cancel or reschedule your appointment, please contact [facility] [phone]  and follow the prompts.  Office hours are 8:00 a.m. to 4:30 p.m. Monday - Friday. Please note that voicemails left after 4:00 p.m. may not be returned until the following business day.  We are closed weekends and major holidays. You have access to a nurse at all times for urgent questions. Please call the main number to the clinic [phone] and follow the prompts.  For any non-urgent questions, you may also contact your provider using MyChart. We now offer e-Visits for anyone 18 and older to request care online for non-urgent symptoms. For details visit [email or web address].   Also download the MyChart app! Go to the app store, search "MyChart", open the app, select [facility], and log in with your MyChart username and password.   

## 2022-12-27 ENCOUNTER — Ambulatory Visit (HOSPITAL_BASED_OUTPATIENT_CLINIC_OR_DEPARTMENT_OTHER)
Admission: RE | Admit: 2022-12-27 | Discharge: 2022-12-27 | Disposition: A | Discharge status: Home / Self Care | Payer: Medicare Other | Source: Ambulatory Visit | Attending: Hematology | Admitting: Hematology

## 2022-12-27 DIAGNOSIS — Z79899 Other long term (current) drug therapy: Secondary | ICD-10-CM | POA: Insufficient documentation

## 2022-12-27 DIAGNOSIS — C787 Secondary malignant neoplasm of liver and intrahepatic bile duct: Secondary | ICD-10-CM | POA: Diagnosis present

## 2022-12-27 DIAGNOSIS — C221 Intrahepatic bile duct carcinoma: Secondary | ICD-10-CM | POA: Insufficient documentation

## 2022-12-28 ENCOUNTER — Other Ambulatory Visit (HOSPITAL_COMMUNITY): Payer: Medicare Other

## 2023-01-01 NOTE — Telephone Encounter (Signed)
Please advise 

## 2023-01-02 ENCOUNTER — Other Ambulatory Visit: Payer: Self-pay | Admitting: *Deleted

## 2023-01-02 MED ORDER — LEVOFLOXACIN 750 MG PO TABS: 750.0000 mg | ORAL_TABLET | Freq: Every day | ORAL | 0 refills | Status: AC

## 2023-01-02 MED ORDER — METHYLPREDNISOLONE 4 MG PO TBPK: ORAL_TABLET | ORAL | 0 refills | Status: DC

## 2023-01-03 NOTE — Progress Notes (Signed)
[facility] [address] [place] [address]    Clinic Day:  01/04/2023  Referring physician: Benita Stabile, MD  Patient Care Team: Benita Stabile, MD as PCP - General (Internal Medicine) Jonelle Sidle, MD as Consulting Physician (Cardiology) Doreatha Massed, MD as Medical Oncologist (Medical Oncology)   ASSESSMENT & PLAN:   Assessment: 1. Liver lesion/peritoneal carcinomatosis: - Patient seen at the request of Dr. [name] - Reported pressure on the sides of the abdomen with slight pain for the last 1 month.  He also reported pain in the epigastric region. - He had lost 20 pounds in the last 3 to 4 months intentionally, cutting back on sugars.  He was also started on semaglutide. - Colonoscopy on 06/28/2011 with a benign polypoid colonic mucosa in the descending colon.  No evidence of malignancy. - MRI of the brain was negative. - EGD and colonoscopy on 09/23/2018 did not reveal any malignancies. 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  IHC positive for CK7, CDX2 and negative for GATA3.  Findings suggestive of upper GI or pancreaticobiliary primary. - Cycle 1 of gemcitabine, cisplatin and durvalumab started on 10/05/2021. - NGS: No targetable mutations.  PD-L1 (YQ657) negative.  MSI-stable.  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   2. Social/family history: - He lives at home by himself.  He records voiceover/narrations. - Non-smoker. - Father died of MDS.  Paternal grandfather had prostate cancer.  Maternal grandfather had cancer.  3.  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 Laverle Patter.  Reportedly received 1 treatment of intravesical chemo and has been on surveillance since then.  Last surveillance visit was in 2020.    Plan: 1. Cholangiocarcinoma with peritoneal carcinomatosis: -CT CAP on 11/21/2022: Stable exam with no progressive findings.  Stable 1.3 cm left liver  lesion. - He has been having dry cough for the past few days.  Lately he has been having yellow mucus when he coughs. - CT chest (12/27/2022): Faint patchy/groundglass opacities in the left lower lobe, new. - It is unclear if it is pneumonitis versus infection.  I have started him on Levaquin after the CT scan.  We have also started him on Medrol Dosepak. - He reports improvement in the cough and shortness of breath. - I will hold his treatment today.  I will reevaluate him in 4 weeks.  He will call us if cough or shortness of breath is worse.   2. Fluid retention: - Continue Bumex 2 mg daily as needed along with metolazone 5 mg 3 times weekly.  3.  Neuropathy/"drawing" of legs: - Continue Mirapex 0.75 mg 3 times daily.  4.  Sleeping difficulty: - Continue Lunesta as needed.  5.  Hypomagnesemia: - Continue magnesium 3 times daily.  Magnesium is normal.  No orders of the defined types were placed in this encounter.     I,Katie Daubenspeck,acting as a Neurosurgeon for Doreatha Massed, MD.,have documented all relevant documentation on the behalf of Doreatha Massed, MD,as directed by  Doreatha Massed, MD while in the presence of Doreatha Massed, MD.   I, Doreatha Massed MD, have reviewed the above documentation for accuracy and completeness, and I agree with the above.   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 male presenting to clinic today for follow up of cholangiocarcinoma and peritoneal carcinomatosis. He was last seen by me on 12/26/22.  He underwent chest CT on 12/27/22 to evaluate his cough. This showed: new faint patchy/ground-glass opacities in LLL, suggesting mild infection/pneumonia; no findings suspicious for metastatic disease.  Today, he states that he is doing well overall. His appetite level is at 100%. His energy level is at 60%.  PAST MEDICAL HISTORY:   Past Medical History: Past Medical History:  Diagnosis Date   Anxiety    Bladder cancer (HCC) 09/11/2009   CAD (coronary artery disease), native coronary artery    a. Mildly elevated troponin 03/2013, cath with nonobstructive disease including 50% AV groove distal stenosis before large OM   Essential hypertension    Headache(784.0)    History of migraines   Hyperglycemia    Mixed hyperlipidemia    Neuropathy    Obesity    Port-A-Cath in place 09/30/2021   Pre-diabetes    Seasonal allergies    Sleep apnea    On CPAP    Surgical History: Past Surgical History:  Procedure Laterality Date   BIOPSY  09/23/2021   Procedure: BIOPSY;  Surgeon: Dolores Frame, MD;  [facility];  Service: Gastroenterology;;   COLONOSCOPY  06/28/2011   Procedure: COLONOSCOPY;  Surgeon: Malissa Hippo, MD;  [facility]: [facility];  Service: Endoscopy;  Laterality: N/A;   COLONOSCOPY WITH PROPOFOL N/A 09/23/2021   Procedure: COLONOSCOPY WITH PROPOFOL;  Surgeon: Dolores Frame, MD;  [facility]: [facility];  Service: Gastroenterology;  Laterality: N/A;  940   ESOPHAGOGASTRODUODENOSCOPY (EGD) WITH PROPOFOL N/A 09/23/2021   Procedure:  ESOPHAGOGASTRODUODENOSCOPY (EGD) WITH PROPOFOL;  Surgeon: Dolores Frame, MD;  Location: [facility];  Service: Gastroenterology;  Laterality: N/A;   IR IMAGING GUIDED PORT INSERTION  09/28/2021   IR PARACENTESIS  09/28/2021   JOINT REPLACEMENT Right    hip   LEFT HEART CATHETERIZATION WITH CORONARY ANGIOGRAM N/A 03/31/2013   Procedure: LEFT HEART CATHETERIZATION WITH CORONARY ANGIOGRAM;  Surgeon: [name], MD;  Location: [facility] CATH LAB;  Service: Cardiovascular;  Laterality: N/A;   POLYPECTOMY  09/23/2021   Procedure: POLYPECTOMY;  Surgeon: Dolores Frame, MD;  Location: [facility];  Service: Gastroenterology;;   TURBT  09/11/2009    Social History: Social History   Socioeconomic History   Marital status: Divorced    Spouse name: Not on file   Number of children: 0   Years of education: college   Highest education level: Not on file  Occupational History    Employer: SELF-EMPLOYED  Tobacco Use   Smoking status: Former    Packs/day: 0.50    Years: 10.00    Additional pack years: 0.00    Total pack years: 5.00    Types: Cigarettes    Quit date: 07/10/2011  Years since quitting: 11.4   Smokeless tobacco: Never   Tobacco comments:    Quit several yrs prior to 03/2013.  Vaping Use   Vaping Use: Never used  Substance and Sexual Activity   Alcohol use: Yes    Alcohol/week: 0.0 standard drinks of alcohol    Comment: Occasional   Drug use: No   Sexual activity: Not on file  Other Topics Concern   Not on file  Social History Narrative   Not on file   Social Determinants of Health   Financial Resource Strain: Not on file  Food Insecurity: No Food Insecurity (06/05/2022)   Hunger Vital Sign    Worried About Running Out of Food in the Last Year: Never true    Ran Out of Food in the Last Year: Never true  Transportation Needs: No Transportation Needs (06/05/2022)   PRAPARE - Administrator, Civil Service (Medical): No    Lack of  Transportation (Non-Medical): No  Physical Activity: Not on file  Stress: Not on file  Social Connections: Not on file  Intimate Partner Violence: Not At Risk (06/05/2022)   Humiliation, Afraid, Rape, and Kick questionnaire    Fear of Current or Ex-Partner: No    Emotionally Abused: No    Physically Abused: No    Sexually Abused: No    Family History: Family History  Problem Relation Age of Onset   COPD Father     Current Medications:  Current Outpatient Medications:    amLODipine (NORVASC) 5 MG tablet, Take 5 mg by mouth daily., Disp: , Rfl:    aspirin EC 81 MG tablet, Take 81 mg by mouth daily., Disp: , Rfl:    benzonatate (TESSALON) 100 MG capsule, Take 1 capsule (100 mg total) by mouth 3 (three) times daily., Disp: 21 capsule, Rfl: 0   bumetanide (BUMEX) 2 MG tablet, Take 1 tablet (2 mg total) by mouth in the morning., Disp: 30 tablet, Rfl: 2   buPROPion (WELLBUTRIN XL) 300 MG 24 hr tablet, Take 300 mg by mouth daily., Disp: , Rfl:    cyclobenzaprine (FLEXERIL) 10 MG tablet, Take 10 mg by mouth at bedtime as needed for muscle spasms., Disp: , Rfl:    Durvalumab (IMFINZI IV), Inject into the vein every 21 ( twenty-one) days., Disp: , Rfl:    Elastic Bandages & Supports (ELASTIC BANDAGE 6") MISC, Use compression wraps during the day for leg swelling, Disp: 4 each, Rfl: 0   Eszopiclone 3 MG TABS, Take 3 mg by mouth at bedtime. Take immediately before bedtime, Disp: , Rfl:    famotidine (PEPCID) 20 MG tablet, TAKE (1) TABLET BY MOUTH AT BEDTIME., Disp: 30 tablet, Rfl: 0   fenofibrate 160 MG tablet, Take 160 mg by mouth daily., Disp: , Rfl:    FLUoxetine (PROZAC) 10 MG capsule, Take 10 mg by mouth daily., Disp: , Rfl:    furosemide (LASIX) 40 MG tablet, Take 1 tablet (40 mg total) by mouth daily., Disp: 30 tablet, Rfl: 1   gabapentin (NEURONTIN) 300 MG capsule, Take 1 capsule (300 mg total) by mouth 3 (three) times daily., Disp: 90 capsule, Rfl: 3   Gauze Pads & Dressings (ABDOMINAL  PAD) 8"X10" PADS, Use for leaking fluid from leg swelling, Disp: 360 each, Rfl: 0   levofloxacin (LEVAQUIN) 750 MG tablet, Take 1 tablet (750 mg total) by mouth daily for 7 days., Disp: 7 tablet, Rfl: 0   magnesium oxide (MAG-OX) 400 (240 Mg) MG tablet, Take 1 tablet (  400 mg total) by mouth 2 (two) times daily. (Patient taking differently: Take 400 mg by mouth 3 (three) times daily.), Disp: 90 tablet, Rfl: 6   metFORMIN (GLUCOPHAGE-XR) 500 MG 24 hr tablet, Take 500 mg by mouth 2 (two) times daily., Disp: , Rfl:    methylPREDNISolone (MEDROL DOSEPAK) 4 MG TBPK tablet, Take per instructions on pack, Disp: 21 tablet, Rfl: 0   metolazone (ZAROXOLYN) 5 MG tablet, Take 1 tablet (5 mg total) by mouth 3 (three) times a week., Disp: 30 tablet, Rfl: 2   olmesartan (BENICAR) 40 MG tablet, TAKE (1) TABLET BY MOUTH ONCE DAILY., Disp: 30 tablet, Rfl: 0   omeprazole (PRILOSEC) 40 MG capsule, Take 40 mg by mouth daily as needed (reflux)., Disp: , Rfl:    pantoprazole (PROTONIX) 40 MG tablet, Take 1 tablet (40 mg total) by mouth daily. (Patient taking differently: Take 40 mg by mouth daily as needed (reflux).), Disp: 90 tablet, Rfl: 3   pramipexole (MIRAPEX) 0.75 MG tablet, Take 1 tablet (0.75 mg total) by mouth 3 (three) times daily., Disp: 90 tablet, Rfl: 0   RYBELSUS 7 MG TABS, Take 14 mg by mouth daily., Disp: , Rfl:    tamsulosin (FLOMAX) 0.4 MG CAPS capsule, Take 0.4 mg by mouth daily., Disp: , Rfl:    traMADol (ULTRAM) 50 MG tablet, Take 50 mg by mouth 4 (four) times daily as needed for moderate pain., Disp: , Rfl:    zaleplon (SONATA) 10 MG capsule, Take 10 mg by mouth at bedtime as needed., Disp: , Rfl:    nitroGLYCERIN (NITROSTAT) 0.4 MG SL tablet, Place 1 tablet (0.4 mg total) under the tongue every 5 (five) minutes as needed for chest pain. (Patient not taking: Reported on 01/04/2023), Disp: 25 tablet, Rfl: 3 No current facility-administered medications for this visit.  Facility-Administered Medications  Ordered in Other Visits:    magnesium sulfate 2 GM/50ML IVPB, , , ,    Allergies: No Known Allergies  REVIEW OF SYSTEMS:   Review of Systems  Constitutional:  Negative for chills, fatigue and fever.  HENT:   Negative for lump/mass, mouth sores, nosebleeds, sore throat and trouble swallowing.   Eyes:  Negative for eye problems.  Respiratory:  Positive for cough and shortness of breath.   Cardiovascular:  Negative for chest pain, leg swelling and palpitations.  Gastrointestinal:  Positive for diarrhea. Negative for abdominal pain, constipation, nausea and vomiting.  Genitourinary:  Negative for bladder incontinence, difficulty urinating, dysuria, frequency, hematuria and nocturia.   Musculoskeletal:  Negative for arthralgias, back pain, flank pain, myalgias and neck pain.  Skin:  Negative for itching and rash.  Neurological:  Positive for numbness. Negative for dizziness and headaches.  Hematological:  Does not bruise/bleed easily.  Psychiatric/Behavioral:  Negative for depression, sleep disturbance and suicidal ideas. The patient is not nervous/anxious.   All other systems reviewed and are negative.    VITALS:   There were no vitals taken for this visit.  Wt Readings from Last 3 Encounters:  01/04/23 276 lb 6.4 oz (125.4 kg)  12/26/22 279 lb 6.4 oz (126.7 kg)  11/28/22 284 lb (128.8 kg)    There is no height or weight on file to calculate BMI.  Performance status (ECOG): 1 - Symptomatic but completely ambulatory  PHYSICAL EXAM:   Physical Exam Vitals and nursing note reviewed. Exam conducted with a chaperone present.  Constitutional:      Appearance: Normal appearance.  Cardiovascular:     Rate and Rhythm: Normal rate  and regular rhythm.     Pulses: Normal pulses.     Heart sounds: Normal heart sounds.  Pulmonary:     Effort: Pulmonary effort is normal.     Breath sounds: Normal breath sounds.  Abdominal:     Palpations: Abdomen is soft. There is no hepatomegaly,  splenomegaly or mass.     Tenderness: There is no abdominal tenderness.  Musculoskeletal:     Right lower leg: No edema.     Left lower leg: No edema.  Lymphadenopathy:     Cervical: No cervical adenopathy.     Right cervical: No superficial, deep or posterior cervical adenopathy.    Left cervical: No superficial, deep or posterior cervical adenopathy.     Upper Body:     Right upper body: No supraclavicular or axillary adenopathy.     Left upper body: No supraclavicular or axillary adenopathy.  Neurological:     General: No focal deficit present.     Mental Status: He is alert and oriented to person, place, and time.  Psychiatric:        Mood and Affect: Mood normal.        Behavior: Behavior normal.     LABS:      Latest Ref Rng & Units 01/04/2023   10:23 AM 12/26/2022    8:23 AM 11/28/2022    9:45 AM  CBC  WBC 4.0 - 10.5 K/uL 7.3  7.2  6.0   Hemoglobin 13.0 - 17.0 g/dL 16.1  09.6  04.5   Hematocrit 39.0 - 52.0 % 39.0  37.7  37.3   Platelets 150 - 400 K/uL 308  227  242       Latest Ref Rng & Units 01/04/2023   10:23 AM 12/26/2022    8:23 AM 11/28/2022    9:45 AM  CMP  Glucose 70 - 99 mg/dL 409  86  85   BUN 8 - 23 mg/dL 27  50  37   Creatinine 0.61 - 1.24 mg/dL 8.11  9.14  7.82   Sodium 135 - 145 mmol/L 135  132  132   Potassium 3.5 - 5.1 mmol/L 3.4  2.9  3.6   Chloride 98 - 111 mmol/L 98  94  94   CO2 22 - 32 mmol/L 28  32  27   Calcium 8.9 - 10.3 mg/dL 9.6  8.8  9.2   Total Protein 6.5 - 8.1 g/dL 6.8  6.6  6.7   Total Bilirubin 0.3 - 1.2 mg/dL 0.2  0.6  0.8   Alkaline Phos 38 - 126 U/L 65  62  65   AST 15 - 41 U/L ALT 0 - 44 U/L 35  22  21      Lab Results  Component Value Date   CEA1 2.8 10/31/2022   /  CEA  Date Value Ref Range Status  10/31/2022 2.8 0.0 - 4.7 ng/mL Final    Comment:    (NOTE)                             Nonsmokers          <3.9                             Smokers             <5.6 [employer]  Electrochemiluminescence  Immunoassay (ECLIA) Values obtained with different assay methods or kits cannot be used interchangeably.  Results cannot be interpreted as absolute evidence of the presence or absence of malignant disease. Performed At: [facility] [address] [place], [place] [id number] Marvin Schimke MD WU:[id number]    No results found for: "PSA1" Lab Results  Component Value Date   NFA213 11 10/31/2022   No results found for: "YQM578"  No results found for: "TOTALPROTELP", "ALBUMINELP", "A1GS", "A2GS", "BETS", "BETA2SER", "GAMS", "MSPIKE", "SPEI" Lab Results  Component Value Date   TIBC 457 (H) 01/04/2022   FERRITIN 522 (H) 01/04/2022   IRONPCTSAT 16 (L) 01/04/2022   Lab Results  Component Value Date   LDH 266 (H) 10/05/2021   LDH 242 (H) 09/01/2021     STUDIES:   CT Chest Wo Contrast  Result Date: 12/29/2022 CLINICAL DATA:  Metastatic cholangiocarcinoma, on immunotherapy, cough EXAM: CT CHEST WITHOUT CONTRAST TECHNIQUE: Multidetector CT imaging of the chest was performed following the standard protocol without IV contrast. RADIATION DOSE REDUCTION: This exam was performed according to the departmental dose-optimization program which includes automated exposure control, adjustment of the mA and/or kV according to patient size and/or use of iterative reconstruction technique. COMPARISON:  CT chest dated 11/21/2022 FINDINGS: Cardiovascular: The heart is normal in size. No pericardial effusion. No evidence of thoracic aortic aneurysm. Atherosclerotic calcifications of the aortic arch. Moderate three-vessel coronary atherosclerosis. Right chest port terminates in the lower SVC. Mediastinum/Nodes: Small mediastinal lymph nodes which do not meet pathologic CT size criteria. Visualized thyroid is unremarkable. Lungs/Pleura: Very mild subpleural scarring in the upper lungs, unchanged. Faint patchy/ground-glass opacities in the left lower lobe (series 4/images 97 and  117), new, suggesting mild infection/pneumonia. 3 mm subpleural nodule in the left lower lobe (series 4/image 97), unchanged. Additional perifissural nodularity along the right major and minor fissure (series 4/image 72), benign. No new/suspicious pulmonary nodules. Mild subpleural scarring in the medial right lower lobe. No pleural effusion or pneumothorax. Upper Abdomen: Visualized upper abdomen is notable for mild hepatic steatosis and cholelithiasis with chronic gallbladder wall thickening. Musculoskeletal: Degenerative changes of the visualized thoracolumbar spine. IMPRESSION: Faint patchy/ground-glass opacities in the left lower lobe, new, suggesting mild infection/pneumonia. No findings suspicious for metastatic disease in the chest. Stable small pulmonary nodules, as above. Cholelithiasis with chronic gallbladder wall thickening. Aortic Atherosclerosis (ICD10-I70.0). Electronically Signed   By: Charline Bills M.D.   On: 12/29/2022 17:38

## 2023-01-04 ENCOUNTER — Inpatient Hospital Stay (HOSPITAL_BASED_OUTPATIENT_CLINIC_OR_DEPARTMENT_OTHER): Payer: Medicare Other | Admitting: Hematology

## 2023-01-04 ENCOUNTER — Inpatient Hospital Stay: Payer: Medicare Other

## 2023-01-04 DIAGNOSIS — C786 Secondary malignant neoplasm of retroperitoneum and peritoneum: Secondary | ICD-10-CM

## 2023-01-04 DIAGNOSIS — C221 Intrahepatic bile duct carcinoma: Secondary | ICD-10-CM | POA: Diagnosis not present

## 2023-01-04 DIAGNOSIS — C787 Secondary malignant neoplasm of liver and intrahepatic bile duct: Secondary | ICD-10-CM

## 2023-01-04 DIAGNOSIS — G479 Sleep disorder, unspecified: Secondary | ICD-10-CM

## 2023-01-04 DIAGNOSIS — Z95828 Presence of other vascular implants and grafts: Secondary | ICD-10-CM

## 2023-01-04 DIAGNOSIS — R609 Edema, unspecified: Secondary | ICD-10-CM | POA: Diagnosis not present

## 2023-01-04 DIAGNOSIS — Z8551 Personal history of malignant neoplasm of bladder: Secondary | ICD-10-CM

## 2023-01-04 DIAGNOSIS — G629 Polyneuropathy, unspecified: Secondary | ICD-10-CM | POA: Diagnosis not present

## 2023-01-04 LAB — CBC WITH DIFFERENTIAL/PLATELET
Abs Immature Granulocytes: 0.1 10*3/uL — ABNORMAL HIGH (ref 0.00–0.07)
Basophils Absolute: 0.1 10*3/uL (ref 0.0–0.1)
Basophils Relative: 1 %
Eosinophils Absolute: 0.1 10*3/uL (ref 0.0–0.5)
Eosinophils Relative: 1 %
HCT: 39 % (ref 39.0–52.0)
Hemoglobin: 12.9 g/dL — ABNORMAL LOW (ref 13.0–17.0)
Immature Granulocytes: 1 %
Lymphocytes Relative: 16 %
Lymphs Abs: 1.2 10*3/uL (ref 0.7–4.0)
MCH: 30.4 pg (ref 26.0–34.0)
MCHC: 33.1 g/dL (ref 30.0–36.0)
MCV: 92 fL (ref 80.0–100.0)
Monocytes Absolute: 0.6 10*3/uL (ref 0.1–1.0)
Monocytes Relative: 8 %
Neutro Abs: 5.4 10*3/uL (ref 1.7–7.7)
Neutrophils Relative %: 73 %
Platelets: 308 10*3/uL (ref 150–400)
RBC: 4.24 MIL/uL (ref 4.22–5.81)
RDW: 13.7 % (ref 11.5–15.5)
WBC: 7.3 10*3/uL (ref 4.0–10.5)
nRBC: 0 % (ref 0.0–0.2)

## 2023-01-04 LAB — COMPREHENSIVE METABOLIC PANEL
ALT: 35 U/L (ref 0–44)
AST: 28 U/L (ref 15–41)
Albumin: 3.6 g/dL (ref 3.5–5.0)
Alkaline Phosphatase: 65 U/L (ref 38–126)
Anion gap: 9 (ref 5–15)
BUN: 27 mg/dL — ABNORMAL HIGH (ref 8–23)
CO2: 28 mmol/L (ref 22–32)
Calcium: 9.6 mg/dL (ref 8.9–10.3)
Chloride: 98 mmol/L (ref 98–111)
Creatinine, Ser: 1.3 mg/dL — ABNORMAL HIGH (ref 0.61–1.24)
GFR, Estimated: >60 mL/min (ref >60)
Glucose, Bld: 120 mg/dL — ABNORMAL HIGH (ref 70–99)
Potassium: 3.4 mmol/L — ABNORMAL LOW (ref 3.5–5.1)
Sodium: 135 mmol/L (ref 135–145)
Total Bilirubin: 0.2 mg/dL — ABNORMAL LOW (ref 0.3–1.2)
Total Protein: 6.8 g/dL (ref 6.5–8.1)

## 2023-01-04 LAB — MAGNESIUM: Magnesium: 1.6 mg/dL — ABNORMAL LOW (ref 1.7–2.4)

## 2023-01-04 MED ORDER — HEPARIN SOD (PORK) LOCK FLUSH 100 UNIT/ML IV SOLN
500.0000 [IU] | Freq: Once | INTRAVENOUS | Status: AC
  Administered 2023-01-04: 500 [IU] via INTRAVENOUS

## 2023-01-04 MED ORDER — SODIUM CHLORIDE 0.9% FLUSH
10.0000 mL | Freq: Once | INTRAVENOUS | Status: AC
  Administered 2023-01-04: 10 mL via INTRAVENOUS

## 2023-01-04 MED ORDER — SODIUM CHLORIDE 0.9% FLUSH
10.0000 mL | INTRAVENOUS | Status: DC | PRN
  Administered 2023-01-04: 10 mL via INTRAVENOUS

## 2023-01-04 NOTE — Progress Notes (Signed)
Treatment held today and for 4 weeks per MD.

## 2023-01-04 NOTE — Patient Instructions (Addendum)
[facility] at [facility] Discharge Instructions   You were seen and examined today by Dr. Ellin Saba.  He reviewed the results of your lab work which are normal/stable.   He reviewed the results of your CT scan of the chest. It is unclear if the scan is showing pneumonitis or infection. Continue the steroid and antibiotic as prescribed.   We will continue to hold your treatment today. We will see you back in 4 weeks w/possible treatment at that time.   Return as scheduled.    Thank you for choosing [facility] at [facility] to provide your oncology and hematology care.  To afford each patient quality time with our provider, please arrive at least 15 minutes before your scheduled appointment time.   If you have a lab appointment with the Cancer Center please come in thru the Main Entrance and check in at the main information desk.  You need to re-schedule your appointment should you arrive 10 or more minutes late.  We strive to give you quality time with our providers, and arriving late affects you and other patients whose appointments are after yours.  Also, if you no show three or more times for appointments you may be dismissed from the clinic at the providers discretion.     Again, thank you for choosing [facility].  Our hope is that these requests will decrease the amount of time that you wait before being seen by our physicians.       _____________________________________________________________  Should you have questions after your visit to [facility], please contact our office at [phone] and follow the prompts.  Our office hours are 8:00 a.m. and 4:30 p.m. Monday - Friday.  Please note that voicemails left after 4:00 p.m. may not be returned until the following business day.  We are closed weekends and major holidays.  You do have access to a nurse 24-7, just call the main number to the clinic [phone]  and do not press any options, hold on the line and a nurse will answer the phone.    For prescription refill requests, have your pharmacy contact our office and allow 72 hours.    Due to Covid, you will need to wear a mask upon entering the hospital. If you do not have a mask, a mask will be given to you at the Main Entrance upon arrival. For doctor visits, patients may have 1 support person age 21 or older with them. For treatment visits, patients can not have anyone with them due to social distancing guidelines and our immunocompromised population.

## 2023-01-12 ENCOUNTER — Other Ambulatory Visit: Payer: Self-pay | Admitting: Physician Assistant

## 2023-01-12 ENCOUNTER — Other Ambulatory Visit: Payer: Self-pay | Admitting: Hematology

## 2023-01-12 DIAGNOSIS — C801 Malignant (primary) neoplasm, unspecified: Secondary | ICD-10-CM

## 2023-01-25 ENCOUNTER — Ambulatory Visit: Payer: Medicare Other | Admitting: Hematology

## 2023-01-25 ENCOUNTER — Other Ambulatory Visit: Payer: Medicare Other

## 2023-01-25 ENCOUNTER — Ambulatory Visit: Payer: Medicare Other

## 2023-01-29 ENCOUNTER — Other Ambulatory Visit: Payer: Self-pay | Admitting: Neurology

## 2023-01-29 ENCOUNTER — Ambulatory Visit (INDEPENDENT_AMBULATORY_CARE_PROVIDER_SITE_OTHER): Payer: Medicare Other | Admitting: Neurology

## 2023-01-29 VITALS — BP 133/84 | HR 83 | Ht 72.0 in | Wt 278.5 lb

## 2023-01-29 DIAGNOSIS — G4733 Obstructive sleep apnea (adult) (pediatric): Secondary | ICD-10-CM | POA: Diagnosis not present

## 2023-01-29 DIAGNOSIS — C801 Malignant (primary) neoplasm, unspecified: Secondary | ICD-10-CM | POA: Diagnosis not present

## 2023-01-29 DIAGNOSIS — G63 Polyneuropathy in diseases classified elsewhere: Secondary | ICD-10-CM

## 2023-01-29 MED ORDER — GABAPENTIN 300 MG PO CAPS: 600.0000 mg | ORAL_CAPSULE | Freq: Four times a day (QID) | ORAL | 11 refills | Status: DC

## 2023-01-29 NOTE — Progress Notes (Signed)
Chief Complaint  Patient presents with   Consult    Rm 15. Patient alone, left hand and thumb twitching when holding the ipad. Patient is also concerns about sleep he would like to discuss, finds himself dosing sometimes. Has had sleep study performed before. 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 Also complains of worsening neuropathy but states he had chemo last year for bile duct cancer. Some concerns with brain fog, but states it only comes and goes.       ASSESSMENT AND PLAN  Marvin Carr is a 67 y.o. male   Long history of restless leg syndrome Peripheral neuropathy,  Under okay control, but has developed augmentation, taking higher dose of Mirapex 0.75 mg 3 times a day,  Advised him to push back Mirapex to 0.75 mg 1 to 2 tablets at nighttime only,  During the day, may take higher dose of gabapentin up to 300 mg 2 tablets 4 times a day as needed  He has developed bilateral feet paresthesia during his chemotherapy for bile duct carcinoma, continue to progress, not a good candidate for nerve conduction study due to bilateral lower extremity pitting edema, skin wound,  Iron level    Obstructive sleep apnea  Referral to sleep study,    Return To Clinic With NP In 6 Months if patient lower extremity pitting edema improved, may consider EMG nerve conduction study, more extensive laboratory evaluation to rule out other treatable causes of peripheral neuropathy   DIAGNOSTIC DATA (LABS, IMAGING, TESTING) - I reviewed patient records, labs, notes, testing and imaging myself where available.  Laboratory evaluation March 2024: PSA 3.1, A1c 6.1, LDL 72, CMP showed elevated creatinine 1.62, CBC hemoglobin of 12.3,  MEDICAL HISTORY:  Marvin Carr is a 67 year old male, seen in request by his primary care from [facility] Dr. Margo Aye, Jonny Ruiz for evaluation of restless leg symptoms, dizziness,  initial evaluation was on Jan 29, 2023  I reviewed and summarized the referring note. 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,  Bladder Cancer in 2011,  DM HLD Obstructive sleep apnea, tried CPAP, could not tolerate it,  Right hip replacement Restless leg syndrome DM  He reportedly history of restless leg symptoms, initially symptoms happen at night to evening time when he tried to relax, urged to stretch his legs, his symptoms gradually gets worse, he was initially treated with Requip for 15 years, lost the benefit, was switched to Mirapex, now on titrating dose of 0.75 mg 3 times a day, he also noticed earlier onset of his restless leg symptoms, sometimes early afternoon when he tried to relax  He has suffered significant lower extremity swelling, leading to infection,  Noticed bilateral feet paresthesia since chemotherapy for his bile duct cancer in 2023, despite stopping chemotherapy, lower extremity paresthesia continue to getting worse, now to above ankle level, denies hands paresthesia  He enjoys yard work, over the past few years he noticed when he raise his head up such as flying drome, he will get dizzy, otherwise there was no significant orthostatic dizziness, there was no orthostatic blood pressure change noted at today's examination    Laboratory evaluations since 2024, potassium 3.4, creatinine 1.3, hemoglobin of 12.9, magnesium 1.6, normal TSH,   PHYSICAL EXAM:   Vitals:   01/29/23 1351  BP: 133/84  Pulse: 83  Weight: 278 lb 8 oz (  126.3 kg)  Height: 6' (1.829 m)  Sitting down 118/75, HR 76 Standing 106/70, HR 78 Standing xone minute: 103/65, 77,    Body mass index is 37.77 kg/m.  PHYSICAL EXAMNIATION:  Gen: NAD, conversant, well nourised, well groomed                     Cardiovascular: Regular rate rhythm, no peripheral edema, warm, nontender. Eyes: Conjunctivae clear without exudates or  hemorrhage Neck: Supple, no carotid bruits. Pulmonary: Clear to auscultation bilaterally   NEUROLOGICAL EXAM:  MENTAL STATUS: Speech/cognition: Awake, alert, oriented to history taking and casual conversation CRANIAL NERVES: CN II: Visual fields are full to confrontation. Pupils are round equal and briskly reactive to light. CN III, IV, VI: extraocular movement are normal. No ptosis. CN V: Facial sensation is intact to light touch CN VII: Face is symmetric with normal eye closure  CN VIII: Hearing is normal to causal conversation. CN IX, X: Phonation is normal. CN XI: Head turning and shoulder shrug are intact  MOTOR: There is no pronator drift of out-stretched arms. Muscle bulk and tone are normal. Muscle strength is normal.  Bilateral lower extremity significant pitting edema, with skin breakdown,  REFLEXES: Reflexes are 1  and symmetric at the biceps, triceps, knees, and absent at ankles. Plantar responses are flexor.  SENSORY: Decreased vibratory and pinprick at toes  COORDINATION: There is no trunk or limb dysmetria noted.  GAIT/STANCE: Able to get up from seated position arm crossed, steady, negative Romberg signs  REVIEW OF SYSTEMS:  Full 14 system review of systems performed and notable only for as above All other review of systems were negative.   ALLERGIES: No Known Allergies  HOME MEDICATIONS: Current Outpatient Medications  Medication Sig Dispense Refill   amLODipine (NORVASC) 5 MG tablet Take 5 mg by mouth daily.     aspirin EC 81 MG tablet Take 81 mg by mouth daily.     benzonatate (TESSALON) 100 MG capsule Take 1 capsule (100 mg total) by mouth 3 (three) times daily. 21 capsule 0   bumetanide (BUMEX) 2 MG tablet Take 1 tablet (2 mg total) by mouth in the morning. 30 tablet 2   buPROPion (WELLBUTRIN XL) 300 MG 24 hr tablet Take 300 mg by mouth daily.     cyclobenzaprine (FLEXERIL) 10 MG tablet Take 10 mg by mouth at bedtime as needed for muscle spasms.      Durvalumab (IMFINZI IV) Inject into the vein every 21 ( twenty-one) days.     Elastic Bandages & Supports (ELASTIC BANDAGE 6") MISC Use compression wraps during the day for leg swelling 4 each 0   Eszopiclone 3 MG TABS Take 3 mg by mouth at bedtime. Take immediately before bedtime     famotidine (PEPCID) 20 MG tablet TAKE (1) TABLET BY MOUTH AT BEDTIME. 30 tablet 0   fenofibrate 160 MG tablet Take 160 mg by mouth daily.     FLUoxetine (PROZAC) 10 MG capsule Take 10 mg by mouth daily.     furosemide (LASIX) 40 MG tablet Take 1 tablet (40 mg total) by mouth daily. 30 tablet 1   gabapentin (NEURONTIN) 300 MG capsule TAKE 1 CAPSULE BY MOUTH EVERY 6 HOURS AS NEEDED. 120 capsule 0   Gauze Pads & Dressings (ABDOMINAL PAD) 8"X10" PADS Use for leaking fluid from leg swelling 360 each 0   magnesium oxide (MAG-OX) 400 (240 Mg) MG tablet Take 1 tablet (400 mg total) by mouth 2 (two) times daily. (  Patient taking differently: Take 400 mg by mouth 3 (three) times daily.) 90 tablet 6   metFORMIN (GLUCOPHAGE-XR) 500 MG 24 hr tablet Take 500 mg by mouth 2 (two) times daily.     methylPREDNISolone (MEDROL DOSEPAK) 4 MG TBPK tablet Take per instructions on pack 21 tablet 0   metolazone (ZAROXOLYN) 5 MG tablet Take 1 tablet (5 mg total) by mouth 3 (three) times a week. 30 tablet 2   nitroGLYCERIN (NITROSTAT) 0.4 MG SL tablet Place 1 tablet (0.4 mg total) under the tongue every 5 (five) minutes as needed for chest pain. 25 tablet 3   olmesartan (BENICAR) 40 MG tablet TAKE (1) TABLET BY MOUTH ONCE DAILY. 30 tablet 0   omeprazole (PRILOSEC) 40 MG capsule Take 40 mg by mouth daily as needed (reflux).     pantoprazole (PROTONIX) 40 MG tablet Take 1 tablet (40 mg total) by mouth daily. (Patient taking differently: Take 40 mg by mouth daily as needed (reflux).) 90 tablet 3   pramipexole (MIRAPEX) 0.75 MG tablet Take 1 tablet (0.75 mg total) by mouth 3 (three) times daily. 90 tablet 0   RYBELSUS 7 MG TABS Take 14 mg by  mouth daily.     tamsulosin (FLOMAX) 0.4 MG CAPS capsule Take 0.4 mg by mouth daily.     traMADol (ULTRAM) 50 MG tablet Take 50 mg by mouth 4 (four) times daily as needed for moderate pain.     zaleplon (SONATA) 10 MG capsule Take 1 capsule (10 mg total) by mouth at bedtime as needed for sleep. 30 capsule 0   No current facility-administered medications for this visit.   Facility-Administered Medications Ordered in Other Visits  Medication Dose Route Frequency Provider Last Rate Last Admin   magnesium sulfate 2 GM/50ML IVPB             PAST MEDICAL HISTORY: Past Medical History:  Diagnosis Date   Anxiety    Bladder cancer (HCC) 09/11/2009   CAD (coronary artery disease), native coronary artery    a. Mildly elevated troponin 03/2013, cath with nonobstructive disease including 50% AV groove distal stenosis before large OM   Essential hypertension    Headache(784.0)    History of migraines   Hyperglycemia    Mixed hyperlipidemia    Neuropathy    Obesity    Port-A-Cath in place 09/30/2021   Pre-diabetes    Seasonal allergies    Sleep apnea    On CPAP    PAST SURGICAL HISTORY: Past Surgical History:  Procedure Laterality Date   BIOPSY  09/23/2021   Procedure: BIOPSY;  Surgeon: Dolores Frame, MD;  [facility]: [facility];  Service: Gastroenterology;;   COLONOSCOPY  06/28/2011   Procedure: COLONOSCOPY;  Surgeon: Malissa Hippo, MD;  [facility];  Service: Endoscopy;  Laterality: N/A;   COLONOSCOPY WITH PROPOFOL N/A 09/23/2021   Procedure: COLONOSCOPY WITH PROPOFOL;  Surgeon: Dolores Frame, MD;  [facility]: [facility];  Service: Gastroenterology;  Laterality: N/A;  940   ESOPHAGOGASTRODUODENOSCOPY (EGD) WITH PROPOFOL N/A 09/23/2021   Procedure: ESOPHAGOGASTRODUODENOSCOPY (EGD) WITH PROPOFOL;  Surgeon: Dolores Frame, MD;  [facility]: [facility];  Service: Gastroenterology;  Laterality: N/A;   IR IMAGING GUIDED PORT INSERTION  09/28/2021    IR PARACENTESIS  09/28/2021   JOINT REPLACEMENT Right    hip   LEFT HEART CATHETERIZATION WITH CORONARY ANGIOGRAM N/A 03/31/2013   Procedure: LEFT HEART CATHETERIZATION WITH CORONARY ANGIOGRAM;  Surgeon: [name], MD;  Location: [facility] CATH LAB;  Service: Cardiovascular;  Laterality: N/A;   POLYPECTOMY  09/23/2021   Procedure: POLYPECTOMY;  Surgeon: Dolores Frame, MD;  [facility]: [facility];  Service: Gastroenterology;;   TURBT  09/11/2009    FAMILY HISTORY: Family History  Problem Relation Age of Onset   COPD Father     SOCIAL HISTORY: Social History   Socioeconomic History   Marital status: Divorced    Spouse name: Not on file   Number of children: 0   Years of education: college   Highest education level: Not on file  Occupational History    Employer: SELF-EMPLOYED  Tobacco Use   Smoking status: Former    Packs/day: 0.50    Years: 10.00    Additional pack years: 0.00    Total pack years: 5.00    Types: Cigarettes    Quit date: 07/10/2011    Years since quitting: 11.5   Smokeless tobacco: Never   Tobacco comments:    Quit several yrs prior to 03/2013.  Vaping Use   Vaping Use: Never used  Substance and Sexual Activity   Alcohol use: Yes    Alcohol/week: 0.0 standard drinks of alcohol    Comment: Occasional   Drug use: No   Sexual activity: Not on file  Other Topics Concern   Not on file  Social History Narrative   Not on file   Social Determinants of Health   Financial Resource Strain: Not on file  Food Insecurity: No Food Insecurity (06/05/2022)   Hunger Vital Sign    Worried About Running Out of Food in the Last Year: Never true    Ran Out of Food in the Last Year: Never true  Transportation Needs: No Transportation Needs (06/05/2022)   PRAPARE - Administrator, Civil Service (Medical): No    Lack of Transportation (Non-Medical): No  Physical Activity: Not on file  Stress: Not on file  Social Connections: Not on file   Intimate Partner Violence: Not At Risk (06/05/2022)   Humiliation, Afraid, Rape, and Kick questionnaire    Fear of Current or Ex-Partner: No    Emotionally Abused: No    Physically Abused: No    Sexually Abused: No      [name], M.D. Ph.D.  [facility] [address] [place], [place] [address] Ph: [phone] Fax: [phone]  CC:  Benita Stabile, MD [address] [place],  [place] [address]  Benita Stabile, MD

## 2023-01-30 ENCOUNTER — Other Ambulatory Visit: Payer: Self-pay

## 2023-01-30 LAB — IRON,TIBC AND FERRITIN PANEL
Ferritin: 183 ng/mL (ref 30–400)
Iron Saturation: 15 % (ref 15–55)
Iron: 65 ug/dL (ref 38–169)
Total Iron Binding Capacity: 428 ug/dL (ref 250–450)
UIBC: 363 ug/dL — ABNORMAL HIGH (ref 111–343)

## 2023-02-01 ENCOUNTER — Inpatient Hospital Stay: Payer: Medicare Other

## 2023-02-01 ENCOUNTER — Inpatient Hospital Stay: Payer: Medicare Other | Attending: Hematology | Admitting: Hematology

## 2023-02-01 VITALS — BP 121/77 | HR 71 | Temp 96.7°F | Resp 18

## 2023-02-01 DIAGNOSIS — R059 Cough, unspecified: Secondary | ICD-10-CM | POA: Insufficient documentation

## 2023-02-01 DIAGNOSIS — R609 Edema, unspecified: Secondary | ICD-10-CM | POA: Insufficient documentation

## 2023-02-01 DIAGNOSIS — Z832 Family history of diseases of the blood and blood-forming organs and certain disorders involving the immune mechanism: Secondary | ICD-10-CM | POA: Diagnosis not present

## 2023-02-01 DIAGNOSIS — G629 Polyneuropathy, unspecified: Secondary | ICD-10-CM | POA: Insufficient documentation

## 2023-02-01 DIAGNOSIS — Z8042 Family history of malignant neoplasm of prostate: Secondary | ICD-10-CM | POA: Diagnosis not present

## 2023-02-01 DIAGNOSIS — G479 Sleep disorder, unspecified: Secondary | ICD-10-CM | POA: Diagnosis not present

## 2023-02-01 DIAGNOSIS — C9000 Multiple myeloma not having achieved remission: Secondary | ICD-10-CM | POA: Diagnosis not present

## 2023-02-01 DIAGNOSIS — C786 Secondary malignant neoplasm of retroperitoneum and peritoneum: Secondary | ICD-10-CM | POA: Diagnosis not present

## 2023-02-01 DIAGNOSIS — Z8551 Personal history of malignant neoplasm of bladder: Secondary | ICD-10-CM | POA: Diagnosis not present

## 2023-02-01 DIAGNOSIS — C787 Secondary malignant neoplasm of liver and intrahepatic bile duct: Secondary | ICD-10-CM

## 2023-02-01 DIAGNOSIS — Z87891 Personal history of nicotine dependence: Secondary | ICD-10-CM | POA: Diagnosis not present

## 2023-02-01 DIAGNOSIS — Z79899 Other long term (current) drug therapy: Secondary | ICD-10-CM | POA: Diagnosis not present

## 2023-02-01 DIAGNOSIS — E876 Hypokalemia: Secondary | ICD-10-CM | POA: Insufficient documentation

## 2023-02-01 DIAGNOSIS — Z95828 Presence of other vascular implants and grafts: Secondary | ICD-10-CM

## 2023-02-01 DIAGNOSIS — Z7982 Long term (current) use of aspirin: Secondary | ICD-10-CM | POA: Diagnosis not present

## 2023-02-01 DIAGNOSIS — C221 Intrahepatic bile duct carcinoma: Secondary | ICD-10-CM | POA: Insufficient documentation

## 2023-02-01 DIAGNOSIS — Z5112 Encounter for antineoplastic immunotherapy: Secondary | ICD-10-CM | POA: Insufficient documentation

## 2023-02-01 LAB — COMPREHENSIVE METABOLIC PANEL
ALT: 23 U/L (ref 0–44)
AST: 28 U/L (ref 15–41)
Albumin: 3.5 g/dL (ref 3.5–5.0)
Alkaline Phosphatase: 86 U/L (ref 38–126)
Anion gap: 14 (ref 5–15)
BUN: 55 mg/dL — ABNORMAL HIGH (ref 8–23)
CO2: 28 mmol/L (ref 22–32)
Calcium: 8.9 mg/dL (ref 8.9–10.3)
Chloride: 92 mmol/L — ABNORMAL LOW (ref 98–111)
Creatinine, Ser: 1.32 mg/dL — ABNORMAL HIGH (ref 0.61–1.24)
GFR, Estimated: 59 mL/min — ABNORMAL LOW (ref >60)
Glucose, Bld: 194 mg/dL — ABNORMAL HIGH (ref 70–99)
Potassium: 3.2 mmol/L — ABNORMAL LOW (ref 3.5–5.1)
Sodium: 134 mmol/L — ABNORMAL LOW (ref 135–145)
Total Bilirubin: 0.5 mg/dL (ref 0.3–1.2)
Total Protein: 6.5 g/dL (ref 6.5–8.1)

## 2023-02-01 LAB — CBC WITH DIFFERENTIAL/PLATELET
Abs Immature Granulocytes: 0.05 10*3/uL (ref 0.00–0.07)
Basophils Absolute: 0.1 10*3/uL (ref 0.0–0.1)
Basophils Relative: 1 %
Eosinophils Absolute: 0.2 10*3/uL (ref 0.0–0.5)
Eosinophils Relative: 2 %
HCT: 35.8 % — ABNORMAL LOW (ref 39.0–52.0)
Hemoglobin: 12.2 g/dL — ABNORMAL LOW (ref 13.0–17.0)
Immature Granulocytes: 1 %
Lymphocytes Relative: 18 %
Lymphs Abs: 1.2 10*3/uL (ref 0.7–4.0)
MCH: 30.9 pg (ref 26.0–34.0)
MCHC: 34.1 g/dL (ref 30.0–36.0)
MCV: 90.6 fL (ref 80.0–100.0)
Monocytes Absolute: 0.9 10*3/uL (ref 0.1–1.0)
Monocytes Relative: 14 %
Neutro Abs: 4.4 10*3/uL (ref 1.7–7.7)
Neutrophils Relative %: 64 %
Platelets: 268 10*3/uL (ref 150–400)
RBC: 3.95 MIL/uL — ABNORMAL LOW (ref 4.22–5.81)
RDW: 13.5 % (ref 11.5–15.5)
WBC: 6.8 10*3/uL (ref 4.0–10.5)
nRBC: 0 % (ref 0.0–0.2)

## 2023-02-01 LAB — MAGNESIUM: Magnesium: 1.8 mg/dL (ref 1.7–2.4)

## 2023-02-01 MED ORDER — SODIUM CHLORIDE 0.9 % IV SOLN: Freq: Once | INTRAVENOUS | Status: AC

## 2023-02-01 MED ORDER — SODIUM CHLORIDE 0.9% FLUSH
10.0000 mL | INTRAVENOUS | Status: DC | PRN
  Administered 2023-02-01: 10 mL

## 2023-02-01 MED ORDER — POTASSIUM CHLORIDE CRYS ER 20 MEQ PO TBCR
40.0000 meq | EXTENDED_RELEASE_TABLET | Freq: Two times a day (BID) | ORAL | Status: DC
  Administered 2023-02-01: 40 meq via ORAL
  Filled 2023-02-01: qty 2

## 2023-02-01 MED ORDER — SODIUM CHLORIDE 0.9% FLUSH
10.0000 mL | Freq: Once | INTRAVENOUS | Status: AC
  Administered 2023-02-01: 10 mL

## 2023-02-01 MED ORDER — POTASSIUM CHLORIDE CRYS ER 20 MEQ PO TBCR: 20.0000 meq | EXTENDED_RELEASE_TABLET | Freq: Every day | ORAL | 3 refills | Status: DC

## 2023-02-01 MED ORDER — SODIUM CHLORIDE 0.9 % IV SOLN
1500.0000 mg | Freq: Once | INTRAVENOUS | Status: AC
  Administered 2023-02-01: 1500 mg via INTRAVENOUS
  Filled 2023-02-01: qty 30

## 2023-02-01 MED ORDER — HEPARIN SOD (PORK) LOCK FLUSH 100 UNIT/ML IV SOLN
500.0000 [IU] | Freq: Once | INTRAVENOUS | Status: AC | PRN
  Administered 2023-02-01: 500 [IU]

## 2023-02-01 NOTE — Patient Instructions (Signed)
[facility]  Discharge Instructions: Thank you for choosing [facility] to provide your oncology and hematology care.  If you have a lab appointment with the Cancer Center - please note that after April 8th, 2024, all labs will be drawn in the cancer center.  You do not have to check in or register with the main entrance as you have in the past but will complete your check-in in the cancer center.  Wear comfortable clothing and clothing appropriate for easy access to any Portacath or PICC line.   We strive to give you quality time with your provider. You may need to reschedule your appointment if you arrive late (15 or more minutes).  Arriving late affects you and other patients whose appointments are after yours.  Also, if you miss three or more appointments without notifying the office, you may be dismissed from the clinic at the provider's discretion.      For prescription refill requests, have your pharmacy contact our office and allow 72 hours for refills to be completed.    Today you received the following chemotherapy and/or immunotherapy agents Imfinzi.  Durvalumab Injection What is this medication? DURVALUMAB (dur VAL ue mab) treats some types of cancer. It works by helping your immune system slow or stop the spread of cancer cells. It is a monoclonal antibody. This medicine may be used for other purposes; ask your health care provider or pharmacist if you have questions. COMMON BRAND NAME(S): IMFINZI What should I tell my care team before I take this medication? They need to know if you have any of these conditions: Allogeneic stem cell transplant (uses someone else's stem cells) Autoimmune diseases, such as Crohn disease, ulcerative colitis, lupus History of chest radiation Nervous system problems, such as Guillain-Barre syndrome, myasthenia gravis Organ transplant An unusual or allergic reaction to durvalumab, other medications, foods, dyes, or  preservatives Pregnant or trying to get pregnant Breast-feeding How should I use this medication? This medication is infused into a vein. It is given by your care team in a hospital or clinic setting. A special MedGuide will be given to you before each treatment. Be sure to read this information carefully each time. Talk to your care team about the use of this medication in children. Special care may be needed. Overdosage: If you think you have taken too much of this medicine contact a poison control center or emergency room at once. NOTE: This medicine is only for you. Do not share this medicine with others. What if I miss a dose? Keep appointments for follow-up doses. It is important not to miss your dose. Call your care team if you are unable to keep an appointment. What may interact with this medication? Interactions have not been studied. This list may not describe all possible interactions. Give your health care provider a list of all the medicines, herbs, non-prescription drugs, or dietary supplements you use. Also tell them if you smoke, drink alcohol, or use illegal drugs. Some items may interact with your medicine. What should I watch for while using this medication? Your condition will be monitored carefully while you are receiving this medication. You may need blood work while taking this medication. This medication may cause serious skin reactions. They can happen weeks to months after starting the medication. Contact your care team right away if you notice fevers or flu-like symptoms with a rash. The rash may be red or purple and then turn into blisters or peeling of  the skin. You may also notice a red rash with swelling of the face, lips, or lymph nodes in your neck or under your arms. Tell your care team right away if you have any change in your eyesight. Talk to your care team if you may be pregnant. Serious birth defects can occur if you take this medication during pregnancy and  for 3 months after the last dose. You will need a negative pregnancy test before starting this medication. Contraception is recommended while taking this medication and for 3 months after the last dose. Your care team can help you find the option that works for you. Do not breastfeed while taking this medication and for 3 months after the last dose. What side effects may I notice from receiving this medication? Side effects that you should report to your care team as soon as possible: Allergic reactions--skin rash, itching, hives, swelling of the face, lips, tongue, or throat Dry cough, shortness of breath or trouble breathing Eye pain, redness, irritation, or discharge with blurry or decreased vision Heart muscle inflammation--unusual weakness or fatigue, shortness of breath, chest pain, fast or irregular heartbeat, dizziness, swelling of the ankles, feet, or hands Hormone gland problems--headache, sensitivity to light, unusual weakness or fatigue, dizziness, fast or irregular heartbeat, increased sensitivity to cold or heat, excessive sweating, constipation, hair loss, increased thirst or amount of urine, tremors or shaking, irritability Infusion reactions--chest pain, shortness of breath or trouble breathing, feeling faint or lightheaded Kidney injury (glomerulonephritis)--decrease in the amount of urine, red or dark brown urine, foamy or bubbly urine, swelling of the ankles, hands, or feet Liver injury--right upper belly pain, loss of appetite, nausea, light-colored stool, dark yellow or brown urine, yellowing skin or eyes, unusual weakness or fatigue Pain, tingling, or numbness in the hands or feet, muscle weakness, change in vision, confusion or trouble speaking, loss of balance or coordination, trouble walking, seizures Rash, fever, and swollen lymph nodes Redness, blistering, peeling, or loosening of the skin, including inside the mouth Sudden or severe stomach pain, bloody diarrhea, fever,  nausea, vomiting Side effects that usually do not require medical attention (report these to your care team if they continue or are bothersome): Bone, joint, or muscle pain Diarrhea Fatigue Loss of appetite Nausea Skin rash This list may not describe all possible side effects. Call your doctor for medical advice about side effects. You may report side effects to FDA at 1-800-FDA-1088. Where should I keep my medication? This medication is given in a hospital or clinic. It will not be stored at home. NOTE: This sheet is a summary. It may not cover all possible information. If you have questions about this medicine, talk to your doctor, pharmacist, or health care provider.  2024 Elsevier/Gold Standard (2022-01-10 00:00:00)       To help prevent nausea and vomiting after your treatment, we encourage you to take your nausea medication as directed.  BELOW ARE SYMPTOMS THAT SHOULD BE REPORTED IMMEDIATELY: *FEVER GREATER THAN 100.4 F (38 C) OR HIGHER *CHILLS OR SWEATING *NAUSEA AND VOMITING THAT IS NOT CONTROLLED WITH YOUR NAUSEA MEDICATION *UNUSUAL SHORTNESS OF BREATH *UNUSUAL BRUISING OR BLEEDING *URINARY PROBLEMS (pain or burning when urinating, or frequent urination) *BOWEL PROBLEMS (unusual diarrhea, constipation, pain near the anus) TENDERNESS IN MOUTH AND THROAT WITH OR WITHOUT PRESENCE OF ULCERS (sore throat, sores in mouth, or a toothache) UNUSUAL RASH, SWELLING OR PAIN  UNUSUAL VAGINAL DISCHARGE OR ITCHING   Items with * indicate a potential emergency and should be followed  up as soon as possible or go to the Emergency Department if any problems should occur.  Please show the CHEMOTHERAPY ALERT CARD or IMMUNOTHERAPY ALERT CARD at check-in to the Emergency Department and triage nurse.  Should you have questions after your visit or need to cancel or reschedule your appointment, please contact [facility] [phone]  and follow the prompts.  Office hours are  8:00 a.m. to 4:30 p.m. Monday - Friday. Please note that voicemails left after 4:00 p.m. may not be returned until the following business day.  We are closed weekends and major holidays. You have access to a nurse at all times for urgent questions. Please call the main number to the clinic [phone] and follow the prompts.  For any non-urgent questions, you may also contact your provider using MyChart. We now offer e-Visits for anyone 29 and older to request care online for non-urgent symptoms. For details visit [email or web address].   Also download the MyChart app! Go to the app store, search "MyChart", open the app, select [place], and log in with your MyChart username and password.

## 2023-02-01 NOTE — Patient Instructions (Signed)
[facility] Discharge Instructions   You were seen and examined today by Dr. Katragadda.  He reviewed the results of your lab work which are normal/stable.   We will proceed with your treatment today.  Return as scheduled.    Thank you for choosing [facility] at [facility] to provide your oncology and hematology care.  To afford each patient quality time with our provider, please arrive at least 15 minutes before your scheduled appointment time.   If you have a lab appointment with the Cancer Center please come in thru the Main Entrance and check in at the main information desk.  You need to re-schedule your appointment should you arrive 10 or more minutes late.  We strive to give you quality time with our providers, and arriving late affects you and other patients whose appointments are after yours.  Also, if you no show three or more times for appointments you may be dismissed from the clinic at the providers discretion.     Again, thank you for choosing [facility].  Our hope is that these requests will decrease the amount of time that you wait before being seen by our physicians.       _____________________________________________________________  Should you have questions after your visit to [facility], please contact our office at [phone] and follow the prompts.  Our office hours are 8:00 a.m. and 4:30 p.m. Monday - Friday.  Please note that voicemails left after 4:00 p.m. may not be returned until the following business day.  We are closed weekends and major holidays.  You do have access to a nurse 24-7, just call the main number to the clinic [phone] and do not press any options, hold on the line and a nurse will answer the phone.    For prescription refill requests, have your pharmacy contact our office and allow 72 hours.    Due to Covid, you will need to wear a mask upon entering  the hospital. If you do not have a mask, a mask will be given to you at the Main Entrance upon arrival. For doctor visits, patients may have 1 support person age 18 or older with them. For treatment visits, patients can not have anyone with them due to social distancing guidelines and our immunocompromised population.      

## 2023-02-01 NOTE — Progress Notes (Signed)
Patient has been examined by Dr. Katragadda. Vital signs and labs have been reviewed by MD - ANC, Creatinine, LFTs, hemoglobin, and platelets are within treatment parameters per M.D. - pt may proceed with treatment.  Primary RN and pharmacy notified.  

## 2023-02-01 NOTE — Progress Notes (Signed)
Patient presents today for chemotherapy infusion. Patient is in satisfactory condition with no complaints voiced. Patient did request for referral to wound clinic for multiple wounds to lower legs bilateral in which Dr Ellin Saba is aware, referral given. Vital signs are stable.  Labs reviewed by Dr. Ellin Saba during the office visit and all labs are within treatment parameters. Patient's potassium 3.2, potassium given PO per orders. We will proceed with treatment per MD orders.   Patient tolerated treatment well with no complaints voiced.  Patient left ambulatory in stable condition.  Vital signs stable at discharge.  Follow up as scheduled.

## 2023-02-01 NOTE — Progress Notes (Signed)
[facility] [address] [place] [address]    Clinic Day:  02/01/2023  Referring physician: Benita Stabile, MD  Patient Care Team: Marvin Stabile, MD as PCP - General (Internal Medicine) Marvin Sidle, MD as Consulting Physician (Cardiology) Marvin Massed, MD as Medical Oncologist (Medical Oncology)   ASSESSMENT & PLAN:   Assessment: 1. Liver lesion/peritoneal carcinomatosis: - Patient seen at the request of Marvin Carr - Reported pressure on the sides of the abdomen with slight pain for the last 1 month.  He also reported pain in the epigastric region. - He had lost 20 pounds in the last 3 to 4 months intentionally, cutting back on sugars.  He was also started on semaglutide. - Colonoscopy on 06/28/2011 with a benign polypoid colonic mucosa in the descending colon.  No evidence of malignancy. - MRI of the brain was negative. - EGD and colonoscopy on 09/23/2018 did not reveal any malignancies. 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  IHC positive for CK7, CDX2 and negative for GATA3.  Findings suggestive of upper GI or pancreaticobiliary primary. - Cycle 1 of gemcitabine, cisplatin and durvalumab started on 10/05/2021. - NGS: No targetable mutations.  PD-L1 (UE454) negative.  MSI-stable.  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   2. Social/family history: - He lives at home by himself.  He records voiceover/narrations. - Non-smoker. - Father died of MDS.  Paternal grandfather had prostate cancer.  Maternal grandfather had cancer.  3.  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 Laverle Carr.  Reportedly received 1 treatment of intravesical chemo and has been on surveillance since then.  Last surveillance visit was in 2020.    Plan: 1. Cholangiocarcinoma with peritoneal carcinomatosis: - CT chest done on 12/27/2022 for dry cough showed faint patchy/groundglass opacities in the left  lower lobe which are new. - We have treated with Levaquin and Medrol Dosepak. - He reports improvement in his cough and shortness of breath. - Reviewed labs today: Normal LFTs.  Creatinine 1.32 and stable.  CBC grossly normal. - We will restart his durvalumab today.  RTC 4 weeks for follow-up. - His leg swellings have improved.  He has very small ulcers on his legs.  Will make referral to wound care center.    2. Fluid retention: - Continue Bumex 2 mg daily as needed along with metolazone 5 mg 3 times weekly.  3.  Neuropathy/"drawing" of legs: - He was seen by a neurology.  Mirapex dose was changed to 0.75 mg 1 to 2 tablets at bedtime only.  He was started on gabapentin 600 mg 4 times daily.  He is taking gabapentin 300 mg 4 times daily.  When he cut back on Mirapex dose, he woke up aching in the morning.  4.  Sleeping difficulty: - Continue Lunesta as needed.  5.  Hypomagnesemia/hypokalemia: - Continue magnesium 3 times daily.  Magnesium is normal. - Potassium is 3.2.  Will start him on K-Dur 20 mEq daily.    No orders of the defined types were placed in this encounter.     I,Marvin Carr,acting as a Neurosurgeon for Marvin Massed, MD.,have documented all relevant documentation on the behalf of Marvin Massed, MD,as directed by  Marvin Massed, MD while in the presence of Marvin Massed, MD.   I, Marvin Massed MD, have reviewed the above documentation for accuracy and completeness, and I agree with the above.   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 male presenting to clinic today for follow up of cholangiocarcinoma and peritoneal carcinomatosis. He was last seen by me on 01/04/23.  Today, he states that he is doing well overall. His appetite level is at 100%. His energy level is at 80%.  PAST MEDICAL HISTORY:   Past Medical History: Past Medical History:  Diagnosis Date   Anxiety    Bladder cancer (HCC) 09/11/2009   CAD (coronary artery disease), native coronary artery    a. Mildly elevated troponin 03/2013, cath with nonobstructive disease including 50% AV groove distal stenosis before large OM   Essential hypertension    Headache(784.0)    History of migraines   Hyperglycemia    Mixed hyperlipidemia    Neuropathy    Obesity    Port-A-Cath in place 09/30/2021   Pre-diabetes    Seasonal allergies    Sleep apnea    On CPAP    Surgical History: Past Surgical History:  Procedure Laterality Date   BIOPSY  09/23/2021   Procedure: BIOPSY;  Surgeon: Dolores Frame, MD;  [facility]: [facility];  Service: Gastroenterology;;   COLONOSCOPY  06/28/2011   Procedure: COLONOSCOPY;  Surgeon: Malissa Hippo, MD;  [facility]: [facility];  Service: Endoscopy;  Laterality: N/A;   COLONOSCOPY WITH PROPOFOL N/A 09/23/2021   Procedure: COLONOSCOPY WITH PROPOFOL;  Surgeon: Dolores Frame, MD;  Location: [facility];  Service: Gastroenterology;  Laterality: N/A;  940   ESOPHAGOGASTRODUODENOSCOPY (EGD) WITH PROPOFOL N/A 09/23/2021   Procedure: ESOPHAGOGASTRODUODENOSCOPY (EGD) WITH  PROPOFOL;  Surgeon: Dolores Frame, MD;  Location: [facility];  Service: Gastroenterology;  Laterality: N/A;   IR IMAGING GUIDED PORT INSERTION  09/28/2021   IR PARACENTESIS  09/28/2021   JOINT REPLACEMENT Right    hip   LEFT HEART CATHETERIZATION WITH CORONARY ANGIOGRAM N/A 03/31/2013   Procedure: LEFT HEART CATHETERIZATION WITH CORONARY ANGIOGRAM;  Surgeon: [name], MD;  Location: [facility] CATH LAB;  Service: Cardiovascular;  Laterality: N/A;   POLYPECTOMY  09/23/2021   Procedure: POLYPECTOMY;  Surgeon: Dolores Frame, MD;  Location: [facility];  Service: Gastroenterology;;   TURBT  09/11/2009    Social History: Social History   Socioeconomic History   Marital status: Divorced    Spouse name: Not on file   Number of children: 0   Years of education: college   Highest education level: Not on file  Occupational History    Employer: SELF-EMPLOYED  Tobacco Use   Smoking status: Former    Packs/day: 0.50    Years: 10.00    Additional pack years: 0.00    Total pack years: 5.00  Types: Cigarettes    Quit date: 07/10/2011    Years since quitting: 11.5   Smokeless tobacco: Never   Tobacco comments:    Quit several yrs prior to 03/2013.  Vaping Use   Vaping Use: Never used  Substance and Sexual Activity   Alcohol use: Yes    Alcohol/week: 0.0 standard drinks of alcohol    Comment: Occasional   Drug use: No   Sexual activity: Not on file  Other Topics Concern   Not on file  Social History Narrative   Not on file   Social Determinants of Health   Financial Resource Strain: Not on file  Food Insecurity: No Food Insecurity (06/05/2022)   Hunger Vital Sign    Worried About Running Out of Food in the Last Year: Never true    Ran Out of Food in the Last Year: Never true  Transportation Needs: No Transportation Needs (06/05/2022)   PRAPARE - Administrator, Civil Service (Medical): No    Lack of Transportation (Non-Medical): No  Physical  Activity: Not on file  Stress: Not on file  Social Connections: Not on file  Intimate Partner Violence: Not At Risk (06/05/2022)   Humiliation, Afraid, Rape, and Kick questionnaire    Fear of Current or Ex-Partner: No    Emotionally Abused: No    Physically Abused: No    Sexually Abused: No    Family History: Family History  Problem Relation Age of Onset   COPD Father     Current Medications:  Current Outpatient Medications:    amLODipine (NORVASC) 5 MG tablet, Take 5 mg by mouth daily., Disp: , Rfl:    aspirin EC 81 MG tablet, Take 81 mg by mouth daily., Disp: , Rfl:    benzonatate (TESSALON) 100 MG capsule, Take 1 capsule (100 mg total) by mouth 3 (three) times daily., Disp: 21 capsule, Rfl: 0   bumetanide (BUMEX) 2 MG tablet, Take 1 tablet (2 mg total) by mouth in the morning., Disp: 30 tablet, Rfl: 2   buPROPion (WELLBUTRIN XL) 300 MG 24 hr tablet, Take 300 mg by mouth daily., Disp: , Rfl:    cyclobenzaprine (FLEXERIL) 10 MG tablet, Take 10 mg by mouth at bedtime as needed for muscle spasms., Disp: , Rfl:    Durvalumab (IMFINZI IV), Inject into the vein every 21 ( twenty-one) days., Disp: , Rfl:    Elastic Bandages & Supports (ELASTIC BANDAGE 6") MISC, Use compression wraps during the day for leg swelling, Disp: 4 each, Rfl: 0   Eszopiclone 3 MG TABS, Take 3 mg by mouth at bedtime. Take immediately before bedtime, Disp: , Rfl:    famotidine (PEPCID) 20 MG tablet, TAKE (1) TABLET BY MOUTH AT BEDTIME., Disp: 30 tablet, Rfl: 0   fenofibrate 160 MG tablet, Take 160 mg by mouth daily., Disp: , Rfl:    FLUoxetine (PROZAC) 10 MG capsule, Take 10 mg by mouth daily., Disp: , Rfl:    furosemide (LASIX) 40 MG tablet, Take 1 tablet (40 mg total) by mouth daily., Disp: 30 tablet, Rfl: 1   gabapentin (NEURONTIN) 300 MG capsule, Take 2 capsules (600 mg total) by mouth 4 (four) times daily., Disp: 240 capsule, Rfl: 11   Gauze Pads & Dressings (ABDOMINAL PAD) 8"X10" PADS, Use for leaking fluid  from leg swelling, Disp: 360 each, Rfl: 0   magnesium oxide (MAG-OX) 400 (240 Mg) MG tablet, Take 1 tablet (400 mg total) by mouth 2 (two) times daily. (Patient taking differently: Take  400 mg by mouth 3 (three) times daily.), Disp: 90 tablet, Rfl: 6   metFORMIN (GLUCOPHAGE-XR) 500 MG 24 hr tablet, Take 500 mg by mouth 2 (two) times daily., Disp: , Rfl:    metolazone (ZAROXOLYN) 5 MG tablet, Take 1 tablet (5 mg total) by mouth 3 (three) times a week., Disp: 30 tablet, Rfl: 2   nitroGLYCERIN (NITROSTAT) 0.4 MG SL tablet, Place 1 tablet (0.4 mg total) under the tongue every 5 (five) minutes as needed for chest pain., Disp: 25 tablet, Rfl: 3   olmesartan (BENICAR) 40 MG tablet, TAKE (1) TABLET BY MOUTH ONCE DAILY., Disp: 30 tablet, Rfl: 0   omeprazole (PRILOSEC) 40 MG capsule, Take 40 mg by mouth daily as needed (reflux)., Disp: , Rfl:    pantoprazole (PROTONIX) 40 MG tablet, Take 1 tablet (40 mg total) by mouth daily. (Patient taking differently: Take 40 mg by mouth daily as needed (reflux).), Disp: 90 tablet, Rfl: 3   potassium chloride SA (KLOR-CON M) 20 MEQ tablet, Take 1 tablet (20 mEq total) by mouth daily., Disp: 60 tablet, Rfl: 3   pramipexole (MIRAPEX) 0.75 MG tablet, Take 1 tablet (0.75 mg total) by mouth 3 (three) times daily., Disp: 90 tablet, Rfl: 0   RYBELSUS 7 MG TABS, Take 14 mg by mouth daily., Disp: , Rfl:    tamsulosin (FLOMAX) 0.4 MG CAPS capsule, Take 0.4 mg by mouth daily., Disp: , Rfl:    traMADol (ULTRAM) 50 MG tablet, Take 50 mg by mouth 4 (four) times daily as needed for moderate pain., Disp: , Rfl:    zaleplon (SONATA) 10 MG capsule, Take 1 capsule (10 mg total) by mouth at bedtime as needed for sleep., Disp: 30 capsule, Rfl: 0 No current facility-administered medications for this visit.  Facility-Administered Medications Ordered in Other Visits:    magnesium sulfate 2 GM/50ML IVPB, , , ,    potassium chloride SA (KLOR-CON M) CR tablet 40 mEq, 40 mEq, Oral, BID, Marvin Massed, MD, 40 mEq at 02/01/23 1343   sodium chloride flush (NS) 0.9 % injection 10 mL, 10 mL, Intracatheter, PRN, Marvin Massed, MD, 10 mL at 02/01/23 1530   Allergies: No Known Allergies  REVIEW OF SYSTEMS:   Review of Systems  Constitutional:  Negative for chills, fatigue and fever.  HENT:   Negative for lump/mass, mouth sores, nosebleeds, sore throat and trouble swallowing.   Eyes:  Negative for eye problems.  Respiratory:  Positive for shortness of breath. Negative for cough.   Cardiovascular:  Negative for chest pain, leg swelling and palpitations.  Gastrointestinal:  Negative for abdominal pain, constipation, diarrhea, nausea and vomiting.  Genitourinary:  Negative for bladder incontinence, difficulty urinating, dysuria, frequency, hematuria and nocturia.   Musculoskeletal:  Negative for arthralgias, back pain, flank pain, myalgias and neck pain.  Skin:  Negative for itching and rash.  Neurological:  Positive for dizziness. Negative for headaches and numbness.  Hematological:  Does not bruise/bleed easily.  Psychiatric/Behavioral:  Negative for depression, sleep disturbance and suicidal ideas. The patient is not nervous/anxious.   All other systems reviewed and are negative.    VITALS:   Blood pressure (!) 145/75, pulse 83, temperature (!) 97.4 F (36.3 C), temperature source Tympanic, resp. rate 20, weight 281 lb 3.2 oz (127.6 kg), SpO2 95 %.  Wt Readings from Last 3 Encounters:  02/01/23 281 lb 3.2 oz (127.6 kg)  01/29/23 278 lb 8 oz (126.3 kg)  01/04/23 276 lb 6.4 oz (125.4 kg)    Body  mass index is 38.14 kg/m.  Performance status (ECOG): 1 - Symptomatic but completely ambulatory  PHYSICAL EXAM:   Physical Exam Vitals and nursing note reviewed. Exam conducted with a chaperone present.  Constitutional:      Appearance: Normal appearance.  Cardiovascular:     Rate and Rhythm: Normal rate and regular rhythm.     Pulses: Normal pulses.     Heart sounds:  Normal heart sounds.  Pulmonary:     Effort: Pulmonary effort is normal.     Breath sounds: Normal breath sounds.  Abdominal:     Palpations: Abdomen is soft. There is no hepatomegaly, splenomegaly or mass.     Tenderness: There is no abdominal tenderness.  Musculoskeletal:     Right lower leg: No edema.     Left lower leg: No edema.  Lymphadenopathy:     Cervical: No cervical adenopathy.     Right cervical: No superficial, deep or posterior cervical adenopathy.    Left cervical: No superficial, deep or posterior cervical adenopathy.     Upper Body:     Right upper body: No supraclavicular or axillary adenopathy.     Left upper body: No supraclavicular or axillary adenopathy.  Neurological:     General: No focal deficit present.     Mental Status: He is alert and oriented to person, place, and time.  Psychiatric:        Mood and Affect: Mood normal.        Behavior: Behavior normal.     LABS:      Latest Ref Rng & Units 02/01/2023   11:47 AM 01/04/2023   10:23 AM 12/26/2022    8:23 AM  CBC  WBC 4.0 - 10.5 K/uL 6.8  7.3  7.2   Hemoglobin 13.0 - 17.0 g/dL 16.1  09.6  04.5   Hematocrit 39.0 - 52.0 % 35.8  39.0  37.7   Platelets 150 - 400 K/uL 268  308  227       Latest Ref Rng & Units 02/01/2023   11:47 AM 01/04/2023   10:23 AM 12/26/2022    8:23 AM  CMP  Glucose 70 - 99 mg/dL 409  811  86   BUN 8 - 23 mg/dL 55  27  50   Creatinine 0.61 - 1.24 mg/dL 9.14  7.82  9.56   Sodium 135 - 145 mmol/L 134  135  132   Potassium 3.5 - 5.1 mmol/L 3.2  3.4  2.9   Chloride 98 - 111 mmol/L 92  98  94   CO2 22 - 32 mmol/L 28  28  32   Calcium 8.9 - 10.3 mg/dL 8.9  9.6  8.8   Total Protein 6.5 - 8.1 g/dL 6.5  6.8  6.6   Total Bilirubin 0.3 - 1.2 mg/dL 0.5  0.2  0.6   Alkaline Phos 38 - 126 U/L 86  65  62   AST 15 - 41 U/L 28  28  27    ALT 0 - 44 U/L 23  35  22      Lab Results  Component Value Date   CEA1 2.8 10/31/2022   /  CEA  Date Value Ref Range Status  10/31/2022 2.8 0.0  - 4.7 ng/mL Final    Comment:    (NOTE)                             Nonsmokers          <  3.9                             Smokers             <5.6 [employer] Electrochemiluminescence Immunoassay (ECLIA) Values obtained with different assay methods or kits cannot be used interchangeably.  Results cannot be interpreted as absolute evidence of the presence or absence of malignant disease. Performed At: [facility] [address] [place], [place] [id number] Jolene Schimke MD WU:[id number]    No results found for: "PSA1" Lab Results  Component Value Date   NFA213 11 10/31/2022   No results found for: "CAN125"  No results found for: "TOTALPROTELP", "ALBUMINELP", "A1GS", "A2GS", "BETS", "BETA2SER", "GAMS", "MSPIKE", "SPEI" Lab Results  Component Value Date   TIBC 428 01/29/2023   TIBC 457 (H) 01/04/2022   FERRITIN 183 01/29/2023   FERRITIN 522 (H) 01/04/2022   IRONPCTSAT 15 01/29/2023   IRONPCTSAT 16 (L) 01/04/2022   Lab Results  Component Value Date   LDH 266 (H) 10/05/2021   LDH 242 (H) 09/01/2021     STUDIES:   No results found.

## 2023-02-05 IMAGING — DX DG CHEST 2V
2 series · 2 of 2 positions shown · non-contrast
Comparison: Chest radiograph 04/30/2021

CLINICAL DATA: Shortness of breath.  Left-sided chest pain.

EXAM:
CHEST - 2 VIEW

[chest pa]
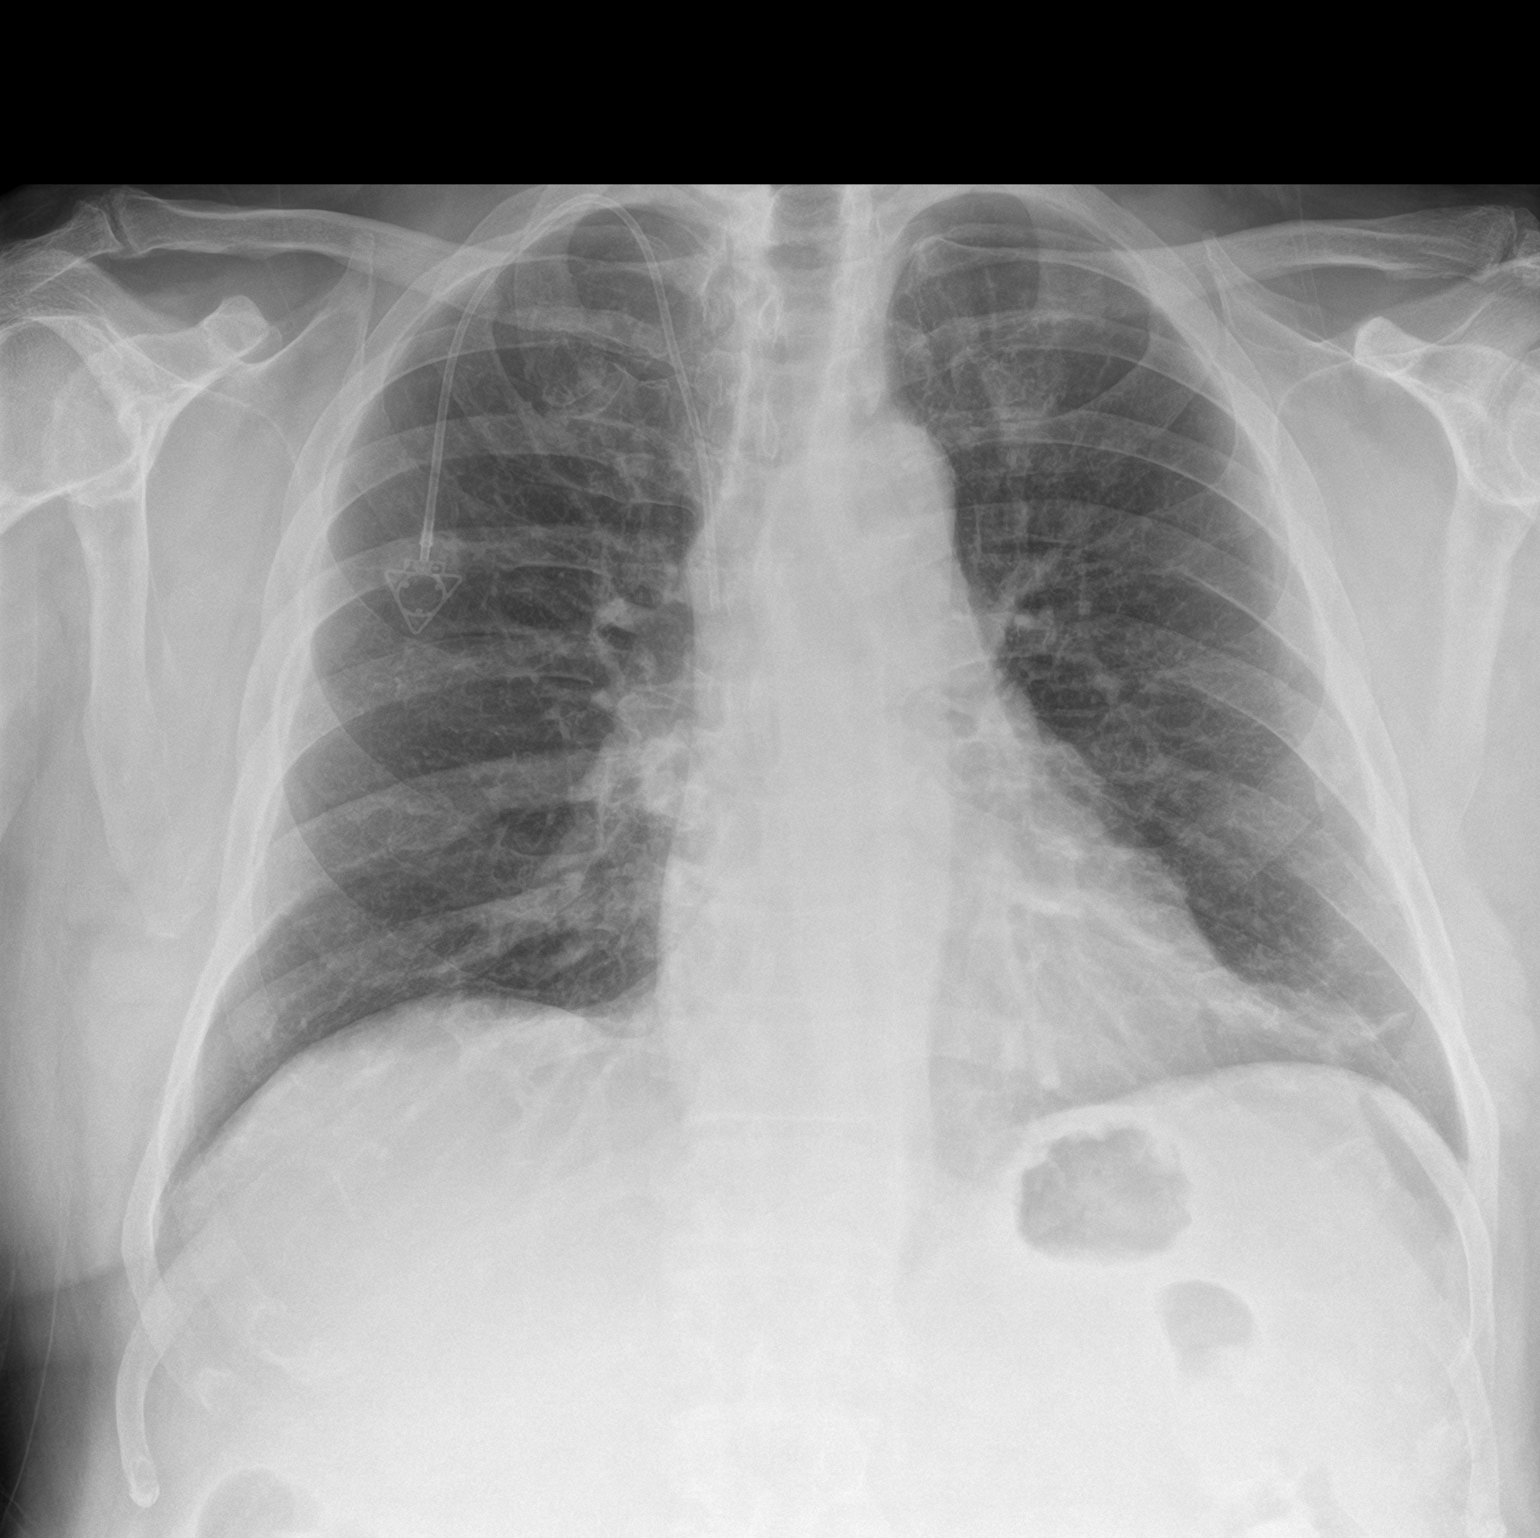

[chest lat]
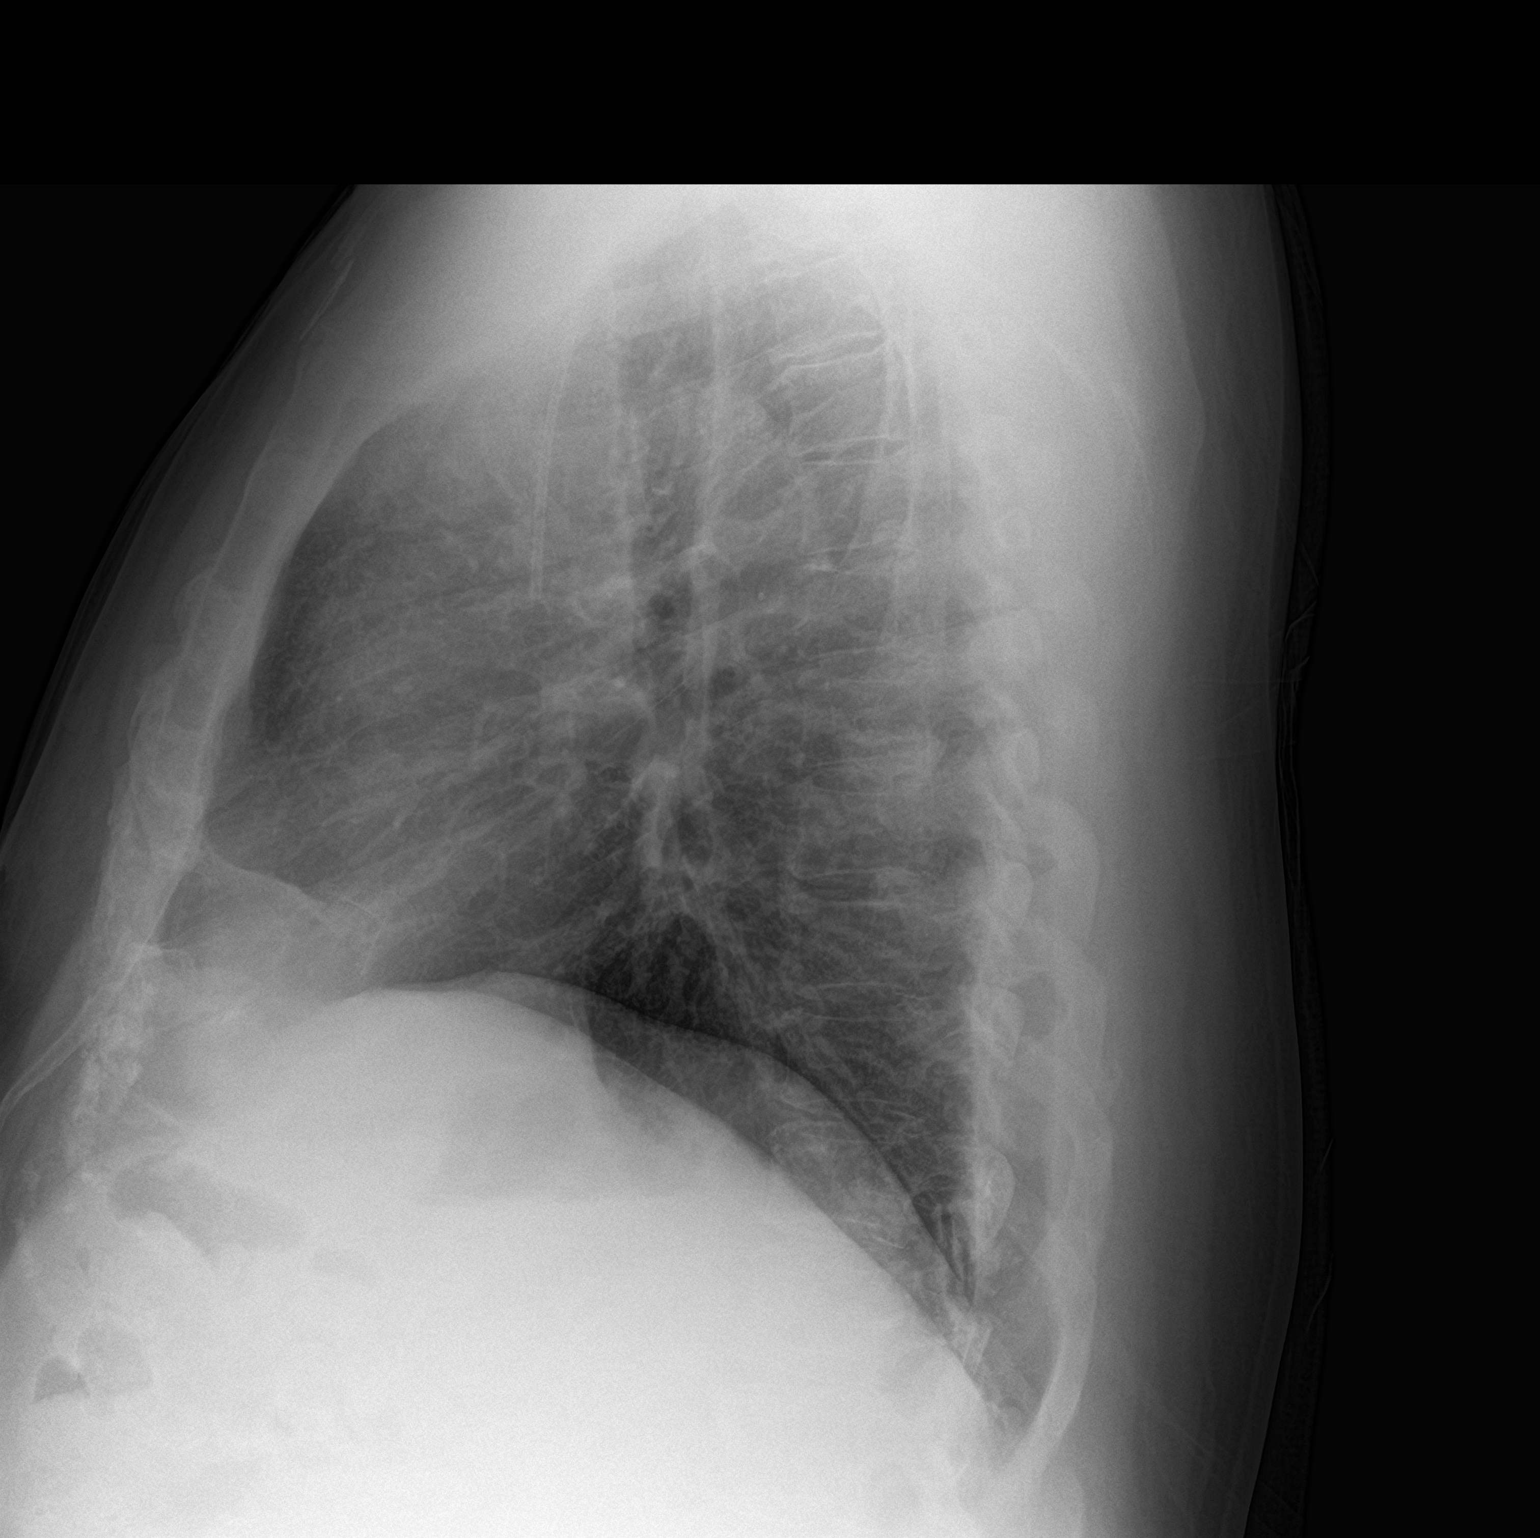

[2 of 2 positions shown; findings below may reference images not displayed]

FINDINGS: Right anterior chest wall Port-A-Cath is present with tip projecting
over the superior vena cava. Stable cardiac and mediastinal
contours. Heterogeneous opacities left lung base. No pleural
effusion or pneumothorax. Thoracic spine degenerative changes.
IMPRESSION: Heterogeneous left lung base opacities favored to represent
atelectasis. Infection not excluded.

## 2023-02-13 ENCOUNTER — Ambulatory Visit (HOSPITAL_COMMUNITY): Payer: Medicare Other | Attending: Hematology | Admitting: Physical Therapy

## 2023-02-13 ENCOUNTER — Encounter (HOSPITAL_COMMUNITY): Payer: Self-pay | Admitting: Physical Therapy

## 2023-02-13 DIAGNOSIS — R2689 Other abnormalities of gait and mobility: Secondary | ICD-10-CM | POA: Insufficient documentation

## 2023-02-13 DIAGNOSIS — S81809A Unspecified open wound, unspecified lower leg, initial encounter: Secondary | ICD-10-CM | POA: Insufficient documentation

## 2023-02-13 NOTE — Therapy (Signed)
OUTPATIENT PHYSICAL THERAPY WOUND EVALUATION   Patient Name: Marvin Carr MRN: [id number] DOB:11/24/1955, 67 y.o., male Today's Date: 02/13/2023   PCP: Benita Stabile MD REFERRING PROVIDER: Doreatha Massed, MD  END OF SESSION:  PT End of Session - 02/13/23 1029     Visit Number 1    Number of Visits 8    Date for PT Re-Evaluation 03/13/23    Authorization Type Primary: Medicare Secondary: [employer]    Progress Note Due on Visit 10    PT Start Time 1029    PT Stop Time 1122    PT Time Calculation (min) 53 min    Activity Tolerance Patient tolerated treatment well    Behavior During Therapy WFL for tasks assessed/performed             Past Medical History:  Diagnosis Date   Anxiety    Bladder cancer (HCC) 09/11/2009   CAD (coronary artery disease), native coronary artery    a. Mildly elevated troponin 03/2013, cath with nonobstructive disease including 50% AV groove distal stenosis before large OM   Essential hypertension    Headache(784.0)    History of migraines   Hyperglycemia    Mixed hyperlipidemia    Neuropathy    Obesity    Port-A-Cath in place 09/30/2021   Pre-diabetes    Seasonal allergies    Sleep apnea    On CPAP   Past Surgical History:  Procedure Laterality Date   BIOPSY  09/23/2021   Procedure: BIOPSY;  Surgeon: Dolores Frame, MD;  Location: [facility];  Service: Gastroenterology;;   COLONOSCOPY  06/28/2011   Procedure: COLONOSCOPY;  Surgeon: Malissa Hippo, MD;  Location: [facility];  Service: Endoscopy;  Laterality: N/A;   COLONOSCOPY WITH PROPOFOL N/A 09/23/2021   Procedure: COLONOSCOPY WITH PROPOFOL;  Surgeon: Dolores Frame, MD;  Location: [facility];  Service: Gastroenterology;  Laterality: N/A;  940   ESOPHAGOGASTRODUODENOSCOPY (EGD) WITH PROPOFOL N/A 09/23/2021   Procedure: ESOPHAGOGASTRODUODENOSCOPY (EGD) WITH PROPOFOL;  Surgeon: Dolores Frame, MD;  Location: [facility];  Service:  Gastroenterology;  Laterality: N/A;   IR IMAGING GUIDED PORT INSERTION  09/28/2021   IR PARACENTESIS  09/28/2021   JOINT REPLACEMENT Right    hip   LEFT HEART CATHETERIZATION WITH CORONARY ANGIOGRAM N/A 03/31/2013   Procedure: LEFT HEART CATHETERIZATION WITH CORONARY ANGIOGRAM;  Surgeon: [name], MD;  Location: [facility] CATH LAB;  Service: Cardiovascular;  Laterality: N/A;   POLYPECTOMY  09/23/2021   Procedure: POLYPECTOMY;  Surgeon: Dolores Frame, MD;  Location: [facility];  Service: Gastroenterology;;   TURBT  09/11/2009   Patient Active Problem List   Diagnosis Date Noted   Obstructive sleep apnea 01/29/2023   Fever 06/04/2022   Sepsis due to cellulitis (HCC) 06/04/2022   Obesity    Depression    Pre-diabetes    Forgetfulness 03/29/2022   Port-A-Cath in place 09/30/2021   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   Lesion of liver 09/19/2021   Peritoneal lesion 09/19/2021   Constipation 09/19/2021   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 One wound got infected and patient was admitted for treatment. Patient has been using compression garments sometimes.    Patient and Family Stated Goals wounds to heal    Date of Onset 05/17/22    Prior Treatments neosporin, antibiotics    Pain Score 0-No pain    Evaluation and Treatment Procedures Explained to  Patient/Family Yes    Evaluation and Treatment Procedures agreed to    Wound Properties Date First Assessed: 02/13/23 Time First Assessed: 1030 Wound Type: Venous stasis ulcer Location: Pretibial Location Orientation: Right Wound Description (Comments): numerous small wounds/peeling skin/weeping Present on Admission: Yes   Dressing Type None    Dressing Changed New    Dressing Change Frequency PRN    Site / Wound Assessment Red;Yellow    % Wound base Red or Granulating --   multiple wounds/weeping at various stages of heeling   Drainage Amount Minimal    Drainage Description Serous    Treatment Cleansed;Debridement (Selective)    Wound Properties Date First Assessed: 02/13/23 Time First Assessed: 1030 Wound Type: Venous stasis ulcer Location: Pretibial Location Orientation: Left Wound Description (Comments): numerous small wounds/ peeling skin/ weeping Present on Admission: Yes   Dressing Type None    Dressing Changed New    Site / Wound Assessment Red;Yellow    % Wound base Red or Granulating --   several wounds/weeping at various stages of heeling   Drainage Amount Minimal    Drainage Description Serous    Treatment Cleansed;Debridement (Selective)    Selective Debridement (non-excisional) - Location bilateral LE    Selective Debridement (non-excisional) - Tissue Removed dry skin    Wound Therapy - Clinical Statement see below    Wound Therapy - Functional Problem List ambulation, bathing    Factors Delaying/Impairing Wound Healing Multiple medical problems;Altered sensation    Hydrotherapy Plan Debridement;Dressing change;Electrical stimulation;Pulsatile lavage with suction;Patient/family education;Ultrasonic wound therapy @35  KHz (+/- 3 KHz)    Wound Therapy - Frequency 2X / week    Wound Therapy - Current Recommendations PT    Wound Plan debride, dressing changes PRN    Dressing  vaseline, xeroform, 4x4s, abd pads for shaping/drainage, profore, netting               PATIENT  EDUCATION: Education details: Patient educated on exam findings, POC, wound treatment, changing dressing if soiled, compression garments, removing top layer of dressing if too tight Person educated: Patient Education method: Explanation Education comprehension: verbalized understanding   HOME EXERCISE PROGRAM: N/a   GOALS: Goals reviewed with patient? Yes  SHORT TERM GOALS: Target date: 02/27/2023    Patients BLE edema to reduce to stop weeping so wounds can begin healing. Baseline: Goal status: INITIAL    LONG TERM GOALS: Target date: 03/13/2023    Wounds to be healed to reduce risk of infection. Baseline:  Goal status: INITIAL  2.  Patient will be able to transition to compression garments for edema management.  Baseline:  Goal status: INITIAL    ASSESSMENT:  CLINICAL IMPRESSION: Patient a 67 y.o. y.o. male who was seen today for physical therapy evaluation and treatment for wounds to lower legs. Patient presents with bilateral LE edema, erythema, and several areas of wounds and weeping on bilateral LE. Cleansed/debrided peeling skin and scabbing from bilateral LE. Applied lotion and Vaseline to  BLE followed by xeroform to skin irritation/wounds, 4x4, abd pads for shaping/drainage, and profore compression wrap to decrease edema. Patient will benefit from PT to promote wound healing.    OBJECTIVE IMPAIRMENTS: Abnormal gait, decreased mobility, increased edema, pain, and skin integrity .   ACTIVITY LIMITATIONS: standing, stairs, bathing, and locomotion level  PARTICIPATION LIMITATIONS: meal prep, cleaning, laundry, community activity, and yard work  PERSONAL FACTORS: 3+ comorbidities: cholangiocarcinoma with peritoneal carcinomatosis, bladder cancer, neuropathy  are also affecting patient's functional outcome.   REHAB POTENTIAL: Good  CLINICAL DECISION MAKING: Evolving/moderate complexity  EVALUATION COMPLEXITY: Moderate  PLAN: PT FREQUENCY: 2x/week  PT  DURATION: 4 weeks  PLANNED INTERVENTIONS: Therapeutic exercises, Therapeutic activity, Neuromuscular re-education, Balance training, Gait training, Patient/Family education, Joint manipulation, Joint mobilization, Stair training, Orthotic/Fit training, DME instructions, Aquatic Therapy, Dry Needling, Electrical stimulation, Spinal manipulation, Spinal mobilization, Cryotherapy, Moist heat, Compression bandaging, scar mobilization, Splintting, Taping, Traction, Ultrasound, Ionotophoresis 4mg /ml Dexamethasone, and Manual therapy   PLAN FOR NEXT SESSION: debride, dressing changes, possible manual for edema   Wyman Songster, PT 02/13/2023, 12:37 PM

## 2023-02-14 ENCOUNTER — Other Ambulatory Visit: Payer: Self-pay | Admitting: Hematology

## 2023-02-16 ENCOUNTER — Encounter (HOSPITAL_COMMUNITY): Payer: Self-pay

## 2023-02-16 ENCOUNTER — Ambulatory Visit (HOSPITAL_COMMUNITY): Payer: Medicare Other

## 2023-02-16 DIAGNOSIS — R2689 Other abnormalities of gait and mobility: Secondary | ICD-10-CM

## 2023-02-16 DIAGNOSIS — S81809A Unspecified open wound, unspecified lower leg, initial encounter: Secondary | ICD-10-CM

## 2023-02-16 NOTE — Therapy (Signed)
OUTPATIENT PHYSICAL THERAPY WOUND EVALUATION   Patient Name: Marvin Carr MRN: [id number] DOB:September 09, 1956, 67 y.o., male Today's Date: 02/16/2023   PCP: Benita Stabile MD REFERRING PROVIDER: Doreatha Massed, MD  END OF SESSION:  PT End of Session - 02/16/23 1721     Visit Number 2    Number of Visits 8    Date for PT Re-Evaluation 03/13/23    Authorization Type Primary: Medicare Secondary: [employer]    Progress Note Due on Visit 10    PT Start Time 0735    PT Stop Time 0824    PT Time Calculation (min) 49 min    Activity Tolerance Patient tolerated treatment well    Behavior During Therapy [facility] for tasks assessed/performed             Past Medical History:  Diagnosis Date   Anxiety    Bladder cancer (HCC) 09/11/2009   CAD (coronary artery disease), native coronary artery    a. Mildly elevated troponin 03/2013, cath with nonobstructive disease including 50% AV groove distal stenosis before large OM   Essential hypertension    Headache(784.0)    History of migraines   Hyperglycemia    Mixed hyperlipidemia    Neuropathy    Obesity    Port-A-Cath in place 09/30/2021   Pre-diabetes    Seasonal allergies    Sleep apnea    On CPAP   Past Surgical History:  Procedure Laterality Date   BIOPSY  09/23/2021   Procedure: BIOPSY;  Surgeon: Dolores Frame, MD;  [facility]: [facility];  Service: Gastroenterology;;   COLONOSCOPY  06/28/2011   Procedure: COLONOSCOPY;  Surgeon: Malissa Hippo, MD;  Location: [facility];  Service: Endoscopy;  Laterality: N/A;   COLONOSCOPY WITH PROPOFOL N/A 09/23/2021   Procedure: COLONOSCOPY WITH PROPOFOL;  Surgeon: Dolores Frame, MD;  [facility]: [facility];  Service: Gastroenterology;  Laterality: N/A;  940   ESOPHAGOGASTRODUODENOSCOPY (EGD) WITH PROPOFOL N/A 09/23/2021   Procedure: ESOPHAGOGASTRODUODENOSCOPY (EGD) WITH PROPOFOL;  Surgeon: Dolores Frame, MD;  [facility]: [facility];  Service:  Gastroenterology;  Laterality: N/A;   IR IMAGING GUIDED PORT INSERTION  09/28/2021   IR PARACENTESIS  09/28/2021   JOINT REPLACEMENT Right    hip   LEFT HEART CATHETERIZATION WITH CORONARY ANGIOGRAM N/A 03/31/2013   Procedure: LEFT HEART CATHETERIZATION WITH CORONARY ANGIOGRAM;  Surgeon: [name], MD;  Location: [facility] CATH LAB;  Service: Cardiovascular;  Laterality: N/A;   POLYPECTOMY  09/23/2021   Procedure: POLYPECTOMY;  Surgeon: Dolores Frame, MD;  [facility]: [facility];  Service: Gastroenterology;;   TURBT  09/11/2009   Patient Active Problem List   Diagnosis Date Noted   Obstructive sleep apnea 01/29/2023   Fever 06/04/2022   Sepsis due to cellulitis (HCC) 06/04/2022   Obesity    Depression    Pre-diabetes    Forgetfulness 03/29/2022   Port-A-Cath in place 09/30/2021   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   Lesion of liver 09/19/2021   Peritoneal lesion 09/19/2021   Constipation 09/19/2021   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  No real pain but does c/o itching.    Patient and Family Stated Goals wounds to heal    Date of Onset 05/17/22    Prior Treatments neosporin, antibiotics    Pain Scale 0-10    Pain Score 0-No pain    Evaluation and Treatment Procedures Explained to Patient/Family Yes    Evaluation and Treatment Procedures agreed to    Wound  Properties Date First Assessed: 02/13/23 Time First Assessed: 1030 Wound Type: Venous stasis ulcer Location: Pretibial Location Orientation: Right Wound Description (Comments): numerous small wounds/peeling skin/weeping Present on Admission: Yes   Dressing Type Alginate;Gauze (Comment);Compression wrap   lotion and vaseline, alginate over blisters, profore with additional cotton and 1/2in foam   Dressing Changed Changed    Dressing Change Frequency PRN    Site / Wound Assessment Red;Yellow    % Wound base Red or Granulating --   multiple wound in various stages of healing   Drainage Amount Minimal    Drainage Description Serous    Treatment Cleansed;Debridement (Selective)    Wound Properties Date First Assessed: 02/13/23 Time First Assessed: 1030 Wound Type: Venous stasis ulcer Location: Pretibial Location Orientation: Left Wound Description (Comments): numerous small wounds/ peeling skin/ weeping Present on Admission: Yes   Wound Image Images linked: 1    Dressing Type Alginate;Gauze (Comment);Compression wrap   lotion and vaseline, alginate over blisters, profore with additional cotton and 1/2in foam   Dressing Changed Changed    Dressing Status Dry    Site / Wound Assessment Red;Yellow    % Wound base Red or Granulating --   multiple wounds in various stages of healing   Drainage Amount Moderate    Drainage Description Serous    Treatment Cleansed;Debridement (Selective)    Selective Debridement (non-excisional) - Location bilateral LE    Selective Debridement (non-excisional) - Tools Used Forceps    Selective Debridement (non-excisional) - Tissue Removed dry skin    Wound Therapy - Clinical Statement see below    Wound Therapy - Functional Problem List ambulation, bathing    Factors Delaying/Impairing Wound Healing Multiple medical problems;Altered sensation    Hydrotherapy Plan Debridement;Dressing change;Electrical stimulation;Pulsatile lavage with suction;Patient/family  education;Ultrasonic wound therapy @35  KHz (+/- 3 KHz)    Wound Therapy - Frequency 2X / week    Wound Therapy - Current Recommendations PT    Wound Plan debride, dressing changes PRN    Dressing  lotion and vaseline, alginate over blisters, profore with additional cotton and 1/2in foam               PATIENT EDUCATION: Education details: Patient educated on exam findings, POC, wound treatment, changing dressing if soiled, compression garments, removing top layer of dressing if too tight Person educated: Patient Education method: Explanation Education comprehension: verbalized understanding   HOME EXERCISE PROGRAM: N/a   GOALS: Goals reviewed with patient? Yes  SHORT TERM GOALS: Target date: 02/27/2023    Patients BLE edema to reduce to stop weeping so wounds can begin healing. Baseline: Goal status: INITIAL    LONG TERM GOALS: Target date: 03/13/2023    Wounds to be healed to reduce risk of infection. Baseline:  Goal status: INITIAL  2.  Patient will be able to transition to compression garments for edema management.  Baseline:  Goal status: INITIAL  ASSESSMENT:  CLINICAL IMPRESSION: 02/16/23:  Pt arrived with dressings on though have slide down BLE with bottleneck, induration and blisters present.  Pt c/o very itchy, educated not to scratch for skin integrity and to reduce infection.  Changed dressings to alginate to address blisters.  LE cleansed and well moisturized prior new dressings.  Added 1/2 foam to assist with dressings sliding down.  Pt will benefit from manual as time allows and compression garments upon discharge.    Eval:  Patient a 67 y.o. y.o. male who was seen today for physical therapy evaluation and treatment for wounds to lower legs. Patient presents with bilateral LE edema, erythema, and several areas of wounds and weeping on bilateral LE. Cleansed/debrided peeling skin and scabbing from bilateral LE. Applied lotion and Vaseline to BLE followed  by xeroform to skin irritation/wounds, 4x4, abd pads for shaping/drainage, and profore compression wrap to decrease edema. Patient will benefit from PT to promote wound healing.    OBJECTIVE IMPAIRMENTS: Abnormal gait, decreased mobility, increased edema, pain, and skin integrity .   ACTIVITY LIMITATIONS: standing, stairs, bathing, and locomotion level  PARTICIPATION LIMITATIONS: meal prep, cleaning, laundry, community activity, and yard work  PERSONAL FACTORS: 3+ comorbidities: cholangiocarcinoma with peritoneal carcinomatosis, bladder cancer, neuropathy  are also affecting patient's functional outcome.   REHAB POTENTIAL: Good  CLINICAL DECISION MAKING: Evolving/moderate complexity  EVALUATION COMPLEXITY: Moderate  PLAN: PT FREQUENCY: 2x/week  PT DURATION: 4 weeks  PLANNED INTERVENTIONS: Therapeutic exercises, Therapeutic activity, Neuromuscular re-education, Balance training, Gait training, Patient/Family education, Joint manipulation, Joint mobilization, Stair training, Orthotic/Fit training, DME instructions, Aquatic Therapy, Dry Needling, Electrical stimulation, Spinal manipulation, Spinal mobilization, Cryotherapy, Moist heat, Compression bandaging, scar mobilization, Splintting, Taping, Traction, Ultrasound, Ionotophoresis 4mg /ml Dexamethasone, and Manual therapy   PLAN FOR NEXT SESSION: debride, dressing changes, possible manual for edema  Becky Sax, LPTA/CLT; CBIS [phone]  Juel Burrow, PTA 02/16/2023, 5:27 PM

## 2023-02-21 ENCOUNTER — Ambulatory Visit (HOSPITAL_COMMUNITY): Payer: Medicare Other | Admitting: Physical Therapy

## 2023-02-21 DIAGNOSIS — R2689 Other abnormalities of gait and mobility: Secondary | ICD-10-CM | POA: Diagnosis not present

## 2023-02-21 DIAGNOSIS — S81809A Unspecified open wound, unspecified lower leg, initial encounter: Secondary | ICD-10-CM

## 2023-02-21 NOTE — Therapy (Signed)
OUTPATIENT PHYSICAL THERAPY TREATMENT   Patient Name: Marvin Carr MRN: [id number] DOB:11/08/1955, 67 y.o., male Today's Date: 02/21/2023   PCP: Benita Stabile MD REFERRING PROVIDER: Doreatha Massed, MD  END OF SESSION:  PT End of Session - 02/21/23 1108     Visit Number 3    Number of Visits 8    Date for PT Re-Evaluation 03/13/23    Authorization Type Primary: Medicare Secondary: [employer]    Progress Note Due on Visit 10    PT Start Time 0735    PT Stop Time 0810    PT Time Calculation (min) 35 min    Activity Tolerance Patient tolerated treatment well    Behavior During Therapy [facility] for tasks assessed/performed             Past Medical History:  Diagnosis Date   Anxiety    Bladder cancer (HCC) 09/11/2009   CAD (coronary artery disease), native coronary artery    a. Mildly elevated troponin 03/2013, cath with nonobstructive disease including 50% AV groove distal stenosis before large OM   Essential hypertension    Headache(784.0)    History of migraines   Hyperglycemia    Mixed hyperlipidemia    Neuropathy    Obesity    Port-A-Cath in place 09/30/2021   Pre-diabetes    Seasonal allergies    Sleep apnea    On CPAP   Past Surgical History:  Procedure Laterality Date   BIOPSY  09/23/2021   Procedure: BIOPSY;  Surgeon: Dolores Frame, MD;  Location: [facility];  Service: Gastroenterology;;   COLONOSCOPY  06/28/2011   Procedure: COLONOSCOPY;  Surgeon: Malissa Hippo, MD;  Location: [facility];  Service: Endoscopy;  Laterality: N/A;   COLONOSCOPY WITH PROPOFOL N/A 09/23/2021   Procedure: COLONOSCOPY WITH PROPOFOL;  Surgeon: Dolores Frame, MD;  Location: [facility];  Service: Gastroenterology;  Laterality: N/A;  940   ESOPHAGOGASTRODUODENOSCOPY (EGD) WITH PROPOFOL N/A 09/23/2021   Procedure: ESOPHAGOGASTRODUODENOSCOPY (EGD) WITH PROPOFOL;  Surgeon: Dolores Frame, MD;  Location: [facility];  Service:  Gastroenterology;  Laterality: N/A;   IR IMAGING GUIDED PORT INSERTION  09/28/2021   IR PARACENTESIS  09/28/2021   JOINT REPLACEMENT Right    hip   LEFT HEART CATHETERIZATION WITH CORONARY ANGIOGRAM N/A 03/31/2013   Procedure: LEFT HEART CATHETERIZATION WITH CORONARY ANGIOGRAM;  Surgeon: [name], MD;  Location: [facility] CATH LAB;  Service: Cardiovascular;  Laterality: N/A;   POLYPECTOMY  09/23/2021   Procedure: POLYPECTOMY;  Surgeon: Dolores Frame, MD;  Location: [facility];  Service: Gastroenterology;;   TURBT  09/11/2009   Patient Active Problem List   Diagnosis Date Noted   Obstructive sleep apnea 01/29/2023   Fever 06/04/2022   Sepsis due to cellulitis (HCC) 06/04/2022   Obesity    Depression    Pre-diabetes    Forgetfulness 03/29/2022   Port-A-Cath in place 09/30/2021   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   Lesion of liver 09/19/2021   Peritoneal lesion 09/19/2021   Constipation 09/19/2021   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  STates his Lt LE hurt badly and removed it Saturday.  Kept Rt one on until yesterday and removed.  Currently without pain but noted dryness, redness, open areas and irritation.    Patient and Family Stated Goals wounds to heal    Date of Onset 05/17/22    Prior Treatments neosporin, antibiotics    Pain Scale 0-10    Pain Score 0-No pain    Evaluation and Treatment  Procedures Explained to Patient/Family Yes    Evaluation and Treatment Procedures agreed to    Wound Properties Date First Assessed: 02/13/23 Time First Assessed: 1030 Wound Type: Venous stasis ulcer Location: Pretibial Location Orientation: Right Wound Description (Comments): numerous small wounds/peeling skin/weeping Present on Admission: Yes   Wound Image Images linked: 1    Dressing Type Alginate;Gauze (Comment);Compression wrap   lotion and vaseline, alginate over blisters, profore with additional cotton and 1/2in foam   Dressing Changed Changed    Dressing Change Frequency PRN    Site / Wound Assessment Red;Yellow    Drainage Amount Minimal    Drainage Description Serosanguineous    Treatment Cleansed;Debridement (Selective)    Wound Properties Date First Assessed: 02/13/23 Time First Assessed: 1030 Wound Type: Venous stasis ulcer Location: Pretibial Location Orientation: Left Wound Description (Comments): numerous small wounds/ peeling skin/ weeping Present on Admission: Yes   Wound Image Images linked: 1    Dressing Type Alginate;Gauze (Comment);Compression wrap   lotion and vaseline, alginate over blisters, profore with additional cotton and 1/2in foam   Dressing Changed Changed    Dressing Status Dry    Site / Wound Assessment Red;Yellow    Drainage Amount Scant    Drainage Description Serosanguineous    Selective Debridement (non-excisional) - Location bilateral LE    Selective Debridement (non-excisional) - Tools Used Forceps    Selective Debridement (non-excisional) - Tissue Removed dry skin    Wound Therapy - Clinical Statement see below    Wound Therapy - Functional Problem List ambulation, bathing    Factors Delaying/Impairing Wound Healing Multiple medical problems;Altered sensation    Hydrotherapy Plan Debridement;Dressing change;Electrical stimulation;Pulsatile lavage with suction;Patient/family education;Ultrasonic wound therapy @35  KHz (+/- 3 KHz)    Wound Therapy -  Frequency 2X / week    Wound Therapy - Current Recommendations PT    Wound Plan debride, dressing changes PRN    Dressing  lotion and vaseline, 1/2in foam, ABD pads to fill in void (no cotton available), kerlix, coban (no profores available)               PATIENT EDUCATION: Education details: Patient educated on exam findings, POC, wound treatment, changing dressing if soiled, compression garments, removing top layer of dressing if too tight Person educated: Patient Education method: Explanation Education comprehension: verbalized understanding   HOME EXERCISE PROGRAM: N/a   GOALS: Goals reviewed with patient? Yes  SHORT TERM GOALS: Target date: 02/27/2023    Patients BLE edema to reduce to stop weeping so wounds can begin healing. Baseline: Goal status: INITIAL    LONG TERM GOALS: Target date: 03/13/2023    Wounds to be healed to reduce risk of infection. Baseline:  Goal status: INITIAL  2.  Patient will be able to transition to compression garments for edema management.  Baseline:  Goal status: INITIAL    ASSESSMENT:  CLINICAL IMPRESSION: Pt arrived with dressings removed.  States they were  bunching up despite addition of 1/2" foam on both LE's last session.   Pt with erythema, induration, dry skin, scratch marks with blood eruptions on Rt LE and blisters present.  LE's photographed under media.  Educated heavily on importance of keeping compression on LE and how this edema/induration is the cause of the dryness and skin eruptions.  Urged to keep areas covered and to moisturize LE's liberally due to dryness.  Educated on how the bacteria under the nails can cause infections when skin is scratched.  Suggested benadryl or application of lotion rather than scratching.  Suggested additional consult from vein/vascular or dermatologist for a specialized cream.  LE's cleansed and well moisturized prior new dressings.  Supplies were low this session so used ABD to bulk up  the distal ankle region followed by kerlix and coban for compression.  No direct dressings applied as with general dryness and many small open areas.  Pt will benefit from manual as time allows and compression garments upon discharge.    Eval:  Patient a 67 y.o. y.o. male who was seen today for physical therapy evaluation and treatment for wounds to lower legs. Patient presents with bilateral LE edema, erythema, and several areas of wounds and weeping on bilateral LE. Cleansed/debrided peeling skin and scabbing from bilateral LE. Applied lotion and Vaseline to BLE followed by xeroform to skin irritation/wounds, 4x4, abd pads for shaping/drainage, and profore compression wrap to decrease edema. Patient will benefit from PT to promote wound healing.    OBJECTIVE IMPAIRMENTS: Abnormal gait, decreased mobility, increased edema, pain, and skin integrity .   ACTIVITY LIMITATIONS: standing, stairs, bathing, and locomotion level  PARTICIPATION LIMITATIONS: meal prep, cleaning, laundry, community activity, and yard work  PERSONAL FACTORS: 3+ comorbidities: cholangiocarcinoma with peritoneal carcinomatosis, bladder cancer, neuropathy  are also affecting patient's functional outcome.   REHAB POTENTIAL: Good  CLINICAL DECISION MAKING: Evolving/moderate complexity  EVALUATION COMPLEXITY: Moderate  PLAN: PT FREQUENCY: 2x/week  PT DURATION: 4 weeks  PLANNED INTERVENTIONS: Therapeutic exercises, Therapeutic activity, Neuromuscular re-education, Balance training, Gait training, Patient/Family education, Joint manipulation, Joint mobilization, Stair training, Orthotic/Fit training, DME instructions, Aquatic Therapy, Dry Needling, Electrical stimulation, Spinal manipulation, Spinal mobilization, Cryotherapy, Moist heat, Compression bandaging, scar mobilization, Splintting, Taping, Traction, Ultrasound, Ionotophoresis 4mg /ml Dexamethasone, and Manual therapy   PLAN FOR NEXT SESSION: debride, dressing changes,  possible manual for edema   Lurena Nida, PTA 02/21/2023, 11:53 AM

## 2023-02-22 ENCOUNTER — Encounter: Payer: Self-pay | Admitting: Neurology

## 2023-02-22 ENCOUNTER — Telehealth: Payer: Self-pay | Admitting: Neurology

## 2023-02-22 ENCOUNTER — Ambulatory Visit: Payer: Medicare Other

## 2023-02-22 ENCOUNTER — Ambulatory Visit (INDEPENDENT_AMBULATORY_CARE_PROVIDER_SITE_OTHER): Payer: Medicare Other | Admitting: Neurology

## 2023-02-22 ENCOUNTER — Ambulatory Visit: Payer: Medicare Other | Admitting: Hematology

## 2023-02-22 ENCOUNTER — Other Ambulatory Visit: Payer: Medicare Other

## 2023-02-22 VITALS — BP 99/64 | HR 69 | Ht 72.0 in | Wt 286.0 lb

## 2023-02-22 DIAGNOSIS — E8809 Other disorders of plasma-protein metabolism, not elsewhere classified: Secondary | ICD-10-CM | POA: Diagnosis not present

## 2023-02-22 DIAGNOSIS — C801 Malignant (primary) neoplasm, unspecified: Secondary | ICD-10-CM | POA: Diagnosis not present

## 2023-02-22 DIAGNOSIS — G2581 Restless legs syndrome: Secondary | ICD-10-CM | POA: Insufficient documentation

## 2023-02-22 DIAGNOSIS — L98499 Non-pressure chronic ulcer of skin of other sites with unspecified severity: Secondary | ICD-10-CM | POA: Diagnosis not present

## 2023-02-22 DIAGNOSIS — Z87898 Personal history of other specified conditions: Secondary | ICD-10-CM

## 2023-02-22 DIAGNOSIS — G63 Polyneuropathy in diseases classified elsewhere: Secondary | ICD-10-CM | POA: Insufficient documentation

## 2023-02-22 DIAGNOSIS — Z8669 Personal history of other diseases of the nervous system and sense organs: Secondary | ICD-10-CM

## 2023-02-22 NOTE — Progress Notes (Signed)
[facility]   SLEEP MEDICINE CLINIC    Provider:  Melvyn Novas, MD  Primary Care Physician:  Benita Stabile, MD [address] [place] [address]     Referring Provider: [name], Md [address] [place],  [place] [address]          Chief Complaint according to patient   Patient presents with:     New Patient (Initial Visit)     Ready in room at 13. 20 hours , reportedly having trouble keeping time of his appointments.       HISTORY OF PRESENT ILLNESS:  Marvin Carr is a 67 y.o. male patient who is seen upon referral on 02/22/2023 from [name]  for a Sleep consultation.  Chief concern :  "Marvin Carr is a 67 y.o. male and established  patient of [name] , who has presented to her on 01-29-2023:   Long history of restless leg syndrome Peripheral neuropathy,             Under okay control, but has developed augmentation, taking higher dose of Mirapex 0.75 mg 3 times a day,             Advised him to push back Mirapex to 0.75 mg 1 to 2 tablets at nighttime only,             During the day, may take higher dose of gabapentin up to 300 mg 2 tablets 4 times a day as needed             He has developed bilateral feet paresthesia during his chemotherapy for bile duct carcinoma, continue to progress, not a good candidate for nerve conduction study due to bilateral lower extremity pitting edema, skin wound,             Iron level".    I have the pleasure of seeing Marvin Carr 02/22/23 a right-handed male with a possible sleep disorder. He has had sleep studies in [place] and at [facility] med. Had a study with [name] and no results posted, 2014. Apparently no diagnosis of a sleep disorder 10 years ago. He was offered a trial of CPAP, but failed. ONO with PCP indicated borderline hypoxic events, oxygen trial failed as well.  He has chronic insomnia and takes [place], as provided by [name], MD. He is in oncology treatment and there was prescribed sonata.  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 This is helped by Mirapex.  Was seen in the wound clinic in [place].     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 He lives in a household  alone , with 2 cats. . Family status is divorced , without children, The patient currently works "gigs"  Tobacco use: none .  ETOH use ; in the past,  Caffeine intake in form of Coffee( 2 cups a day) Soda( /) Tea ( /) or energy drinks Exercise in form of walking is limited.      Sleep habits are as follows: The patient's dinner time is between 6-7 PM.  The patient goes to bed at 11-2 AM and continues to sleep for intervals of 2 hours, wakes from unknown causes.  Not from pain, not SOB.  The preferred sleep position is laterally but  edema is interfering, legs elevated now in recliner. Supine now. , with the support of 1-2 pillows.  Dreams are reportedly frequent.  The patient wakes up spontaneously before 9  AM is the usual rise time. He reports not  always feeling refreshed or restored in AM, with symptoms such as dry mouth, morning headaches, and residual fatigue.  Headaches are better  when sleeping in the recliner.  Unintended Naps are taken frequently, lasting from 10 to 30 minutes and are more refreshing than nocturnal sleep.    Review of Systems: Out of a complete 14 system review, the patient complains of only the following symptoms, and all other reviewed systems are negative.:  Fatigue, sleepiness , Not known to snore,  fragmented sleep, chronic sleep initiation Insomnia, RLS.    HYPERSOMNIA>  How likely are you to doze in the following situations: 0 = not likely, 1 = slight chance, 2 = moderate chance, 3 = high chance   Sitting and Reading? Watching Television? Sitting inactive in a public place  (theater or meeting)? As a passenger in a car for an hour without a break? Lying down in the afternoon when circumstances permit? Sitting and talking to someone? Sitting quietly after lunch without alcohol? In a car, while stopped for a few minutes in traffic?   Total = 13/ 24 points   FSS endorsed at 48/ 63 points.   GDS 6/ 15   Social History   Socioeconomic History   Marital status: Divorced    Spouse name: Not on file   Number of children: 0   Years of education: college   Highest education level: Not on file  Occupational History    Employer: SELF-EMPLOYED  Tobacco Use   Smoking status: Former    Packs/day: 0.50    Years: 10.00    Additional pack years: 0.00    Total pack years: 5.00    Types: Cigarettes    Quit date: 07/10/2011    Years since quitting: 11.6   Smokeless tobacco: Never   Tobacco comments:    Quit several yrs prior to 03/2013.  Vaping Use   Vaping Use: Never used  Substance and Sexual Activity   Alcohol use: Yes    Alcohol/week: 0.0 standard drinks of alcohol    Comment: Occasional   Drug use: No   Sexual activity: Not on file  Other Topics Concern   Not on file  Social History Narrative   Not on file   Social Determinants of Health   Financial Resource Strain: Not on file  Food Insecurity: No Food Insecurity (06/05/2022)   Hunger Vital Sign    Worried About Running Out of Food in the Last Year: Never true    Ran Out of Food in the Last Year: Never true  Transportation Needs: No Transportation Needs (06/05/2022)   PRAPARE - Administrator, Civil Service (Medical): No    Lack of Transportation (Non-Medical): No  Physical Activity: Not on file  Stress: Not on file  Social Connections: Not on file    Family History  Problem Relation Age of Onset   COPD Father     Past Medical History:  Diagnosis Date   Anxiety    Bladder cancer (HCC) 09/11/2009   CAD (coronary artery disease), native coronary artery    a. Mildly  elevated troponin 03/2013, cath with nonobstructive disease including 50% AV groove distal stenosis before large OM   Essential hypertension    Headache(784.0)    History of  migraines   Hyperglycemia    Mixed hyperlipidemia    Neuropathy    Obesity    Port-A-Cath in place 09/30/2021   Pre-diabetes    Seasonal allergies    Sleep apnea    On CPAP    Past Surgical History:  Procedure Laterality Date   BIOPSY  09/23/2021   Procedure: BIOPSY;  Surgeon: Dolores Frame, MD;  [facility];  Service: Gastroenterology;;   COLONOSCOPY  06/28/2011   Procedure: COLONOSCOPY;  Surgeon: Malissa Hippo, MD;  [facility];  Service: Endoscopy;  Laterality: N/A;   COLONOSCOPY WITH PROPOFOL N/A 09/23/2021   Procedure: COLONOSCOPY WITH PROPOFOL;  Surgeon: Dolores Frame, MD;  [facility]: [facility];  Service: Gastroenterology;  Laterality: N/A;  940   ESOPHAGOGASTRODUODENOSCOPY (EGD) WITH PROPOFOL N/A 09/23/2021   Procedure: ESOPHAGOGASTRODUODENOSCOPY (EGD) WITH PROPOFOL;  Surgeon: Dolores Frame, MD;  [facility]: [facility];  Service: Gastroenterology;  Laterality: N/A;   IR IMAGING GUIDED PORT INSERTION  09/28/2021   IR PARACENTESIS  09/28/2021   JOINT REPLACEMENT Right    hip   LEFT HEART CATHETERIZATION WITH CORONARY ANGIOGRAM N/A 03/31/2013   Procedure: LEFT HEART CATHETERIZATION WITH CORONARY ANGIOGRAM;  Surgeon: [name], MD;  Location[facility] CATH LAB;  Service: Cardiovascular;  Laterality: N/A;   POLYPECTOMY  09/23/2021   Procedure: POLYPECTOMY;  Surgeon: Dolores Frame, MD;  [facility]: [facility];  Service: Gastroenterology;;   TURBT  09/11/2009     Current Outpatient Medications on File Prior to Visit  Medication Sig Dispense Refill   amLODipine (NORVASC) 5 MG tablet Take 5 mg by mouth daily.     aspirin EC 81 MG tablet Take 81 mg by mouth daily.     bumetanide (BUMEX) 2 MG tablet Take 1 tablet (2 mg total) by mouth in the  morning. 30 tablet 2   buPROPion (WELLBUTRIN XL) 300 MG 24 hr tablet Take 300 mg by mouth daily.     cyclobenzaprine (FLEXERIL) 10 MG tablet Take 10 mg by mouth at bedtime as needed for muscle spasms.     Durvalumab (IMFINZI IV) Inject into the vein every 21 ( twenty-one) days.     Elastic Bandages & Supports (ELASTIC BANDAGE 6") MISC Use compression wraps during the day for leg swelling 4 each 0   Eszopiclone 3 MG TABS Take 3 mg by mouth at bedtime. Take immediately before bedtime     fenofibrate 160 MG tablet Take 160 mg by mouth daily.     FLUoxetine (PROZAC) 10 MG capsule Take 10 mg by mouth daily.     gabapentin (NEURONTIN) 300 MG capsule Take 2 capsules (600 mg total) by mouth 4 (four) times daily. 240 capsule 11   Gauze Pads & Dressings (ABDOMINAL PAD) 8"X10" PADS Use for leaking fluid from leg swelling 360 each 0   magnesium oxide (MAG-OX) 400 (240 Mg) MG tablet Take 1 tablet (400 mg total) by mouth 2 (two) times daily. (Patient taking differently: Take 400 mg by mouth 3 (three) times daily.) 90 tablet 6   metFORMIN (GLUCOPHAGE-XR) 500 MG 24 hr tablet Take 500 mg by mouth 2 (two) times daily.     metolazone (ZAROXOLYN) 5 MG tablet Take 1 tablet (5 mg total) by mouth 3 (three) times a week. 30 tablet 2   nitroGLYCERIN (NITROSTAT) 0.4 MG SL tablet Place 1 tablet (0.4 mg total) under the tongue every 5 (five) minutes as needed for chest pain. 25 tablet 3   olmesartan (  BENICAR) 40 MG tablet TAKE (1) TABLET BY MOUTH ONCE DAILY. 30 tablet 0   pantoprazole (PROTONIX) 40 MG tablet Take 1 tablet (40 mg total) by mouth daily. (Patient taking differently: Take 40 mg by mouth daily as needed (reflux).) 90 tablet 3   potassium chloride SA (KLOR-CON M) 20 MEQ tablet Take 1 tablet (20 mEq total) by mouth daily. 60 tablet 3   pramipexole (MIRAPEX) 0.75 MG tablet Take 1 tablet (0.75 mg total) by mouth 3 (three) times daily. 90 tablet 0   RYBELSUS 7 MG TABS Take 14 mg by mouth daily.     tamsulosin (FLOMAX)  0.4 MG CAPS capsule Take 0.4 mg by mouth daily.     traMADol (ULTRAM) 50 MG tablet Take 50 mg by mouth 4 (four) times daily as needed for moderate pain.     zaleplon (SONATA) 10 MG capsule Take 1 capsule (10 mg total) by mouth at bedtime as needed for sleep. 30 capsule 0   [DISCONTINUED] prochlorperazine (COMPAZINE) 10 MG tablet Take 1 tablet (10 mg total) by mouth every 6 (six) hours as needed (Nausea or vomiting). 30 tablet 1   Current Facility-Administered Medications on File Prior to Visit  Medication Dose Route Frequency Provider Last Rate Last Admin   magnesium sulfate 2 GM/50ML IVPB             No Known Allergies   DIAGNOSTIC DATA (LABS, IMAGING, TESTING) - I reviewed patient records, labs, notes, testing and imaging myself where available.  Lab Results  Component Value Date   WBC 6.8 02/01/2023   HGB 12.2 (L) 02/01/2023   HCT 35.8 (L) 02/01/2023   MCV 90.6 02/01/2023   PLT 268 02/01/2023      Component Value Date/Time   NA 134 (L) 02/01/2023 1147   K 3.2 (L) 02/01/2023 1147   CL 92 (L) 02/01/2023 1147   CO2 28 02/01/2023 1147   GLUCOSE 194 (H) 02/01/2023 1147   BUN 55 (H) 02/01/2023 1147   CREATININE 1.32 (H) 02/01/2023 1147   CALCIUM 8.9 02/01/2023 1147   PROT 6.5 02/01/2023 1147   ALBUMIN 3.5 02/01/2023 1147   AST 28 02/01/2023 1147   ALT 23 02/01/2023 1147   ALKPHOS 86 02/01/2023 1147   BILITOT 0.5 02/01/2023 1147   GFRNONAA 59 (L) 02/01/2023 1147   GFRAA >90 06/11/2014 0558   Lab Results  Component Value Date   CHOL 121 07/22/2014   HDL 35 (L) 07/22/2014   LDLCALC 52 07/22/2014   TRIG 169 (H) 07/22/2014   CHOLHDL 3.5 07/22/2014   Lab Results  Component Value Date   HGBA1C 5.1 06/04/2022   Lab Results  Component Value Date   VITAMINB12 2,800 (H) 01/04/2022   Lab Results  Component Value Date   TSH 3.673 12/26/2022    PHYSICAL EXAM:  Today's Vitals   02/22/23 1309  BP: 99/64  Pulse: 69  Weight: 286 lb (129.7 kg)  Height: 6' (1.829 m)    Body mass index is 38.79 kg/m.   Wt Readings from Last 3 Encounters:  02/22/23 286 lb (129.7 kg)  02/01/23 281 lb 3.2 oz (127.6 kg)  01/29/23 278 lb 8 oz (126.3 kg)     Ht Readings from Last 3 Encounters:  02/22/23 6' (1.829 m)  01/29/23 6' (1.829 m)  12/26/22 6' (1.829 m)      General: The patient is awake, alert and appears not in acute distress. The patient is well groomed. Head: Normocephalic, atraumatic.  Neck is supple. Mallampati ,  neck circumference:19 inches . Nasal airflow  patent.  Retrognathia is not seen.  Dental status: biologic Cardiovascular:  Regular rate and cardiac rhythm by pulse,  without distended neck veins. Respiratory: Lungs are clear to auscultation.  Skin:  With severe ankle edema, wounds in both legs. Trunk: The patient's abdomen is extended.    NEUROLOGIC EXAM: The patient is awake and alert, oriented to place and time.   Memory subjective described as intact.  Attention span & concentration ability appears normal.  Speech is fluent,  without  dysarthria, dysphonia or aphasia.  Mood and affect are appropriate.   Cranial nerves: no loss of smell or taste reported  Pupils are equal and briskly reactive to light. Funduscopic exam deferred.  Extraocular movements in vertical and horizontal planes were intact and without nystagmus. No Diplopia. Visual fields by finger perimetry are intact. Hearing was intact to soft voice and finger rubbing.    Facial sensation intact to fine touch.  Facial motor strength is symmetric and tongue and uvula move midline.  Neck ROM : rotation, tilt and flexion extension were normal for age and shoulder shrug was symmetrical.    Motor exam:  Symmetric bulk, tone and ROM.   Normal tone without cog-wheeling, symmetric grip strength .   Sensory:  Fine touch, pinprick and vibration were tested  and  normal.  Proprioception tested in the upper extremities was normal.   Coordination: def Gait and station:  Patient  could rise unassisted from a seated position, walked without assistive device.  Stance is of wider width/ base and the patient turned with 4 steps.  Toe and heel walk were deferred.  Deep tendon reflexes: in the  upper and lower extremities are symmetric and intact.  Babinski response was deferred .    ASSESSMENT AND PLAN 67 y.o. year old male patient of Dr Zannie Cove and dr Scharlene Gloss presented here for sleep consultation :    1) his RLS history is decades long and preceded the cancer. His pain now and his cramping can v certainly by lymph -edematous and edematous in origin. Its OK to continue Mirapex , I will not need iron studies in a patient with an abdominal cancer. I will not be able to offer another path. Iron studies were normal for ferritin, TIBC and free iron.  Had low magnesium and this  helps with cramping.  This patient will have pain given the stretching of leg skin.  He needs to see a wound care center .   2) Not a snorer by history , never diagnosed with sleep apnea. Is now sleepy but that is certainly related to treatment of cancer, chemotherapy and daytime diuresis.   3) chromic insomnia, non- organic. This would be treated by a cognitive behavior therapist, I do not take medications over.   I have a HST ordered for apnea screening and no sleep study is needed to evaluate RLS- he finds  He can continue with his current medications and I like that he takes diuretics in AM and not PM.    Dear [name] , I plan to follow up either personally or through our NP only if sleep apnea can be diagnosed.    CC: I will share my notes with PCP as well . I will initiate a wound care center visit in person .  After spending a total time of  40  minutes face to face and additional time for physical and neurologic examination, review of laboratory studies,  personal review of  imaging studies, reports and results of other testing and review of referral information / records as far as provided in  visit.  Electronically signed by: Melvyn Novas, MD 02/22/2023 1:18 PM  [facility] Board certified by The [employer] of Sleep Medicine and Diplomate of the [employer] of Sleep Medicine. Board certified In Neurology through the ABPN, Fellow of the [employer] of Neurology.

## 2023-02-22 NOTE — Telephone Encounter (Signed)
Referral faxed to [facility]: Phone: [phone]  Fax: [phone]

## 2023-02-22 NOTE — Patient Instructions (Signed)
      1) his RLS history is decades long and preceded the cancer. His pain now and his cramping can certainly by lymph -edematous and edematous in origin. Its OK to continue Mirapex , I will not need iron studies in a patient with an abdominal cancer. I will not be able to offer another path. Iron studies were normal for ferritin, TIBC and free iron.  Had low magnesium and this  helps with cramping.  This patient will have pain given the stretching of leg skin.  He needs to see a wound care center .   2) Not a snorer by history , never diagnosed with sleep apnea. Is now sleepy but that is certainly related to treatment of cancer, chemotherapy and daytime diuresis.   3) chromic insomnia, non- organic. This would be treated by a cognitive behavior therapist, I do not take medications over.   I have a HST ordered for apnea screening and no sleep study is needed to evaluate RLS- he finds  He can continue with his current medications and I like that he takes diuretics in AM and not PM.    Dear [name] , I plan to follow up either personally or through our NP only if sleep apnea can be diagnosed.

## 2023-02-23 ENCOUNTER — Encounter (HOSPITAL_COMMUNITY): Payer: Self-pay

## 2023-02-23 ENCOUNTER — Ambulatory Visit (HOSPITAL_COMMUNITY): Payer: Medicare Other

## 2023-02-23 DIAGNOSIS — R2689 Other abnormalities of gait and mobility: Secondary | ICD-10-CM | POA: Diagnosis not present

## 2023-02-23 DIAGNOSIS — S81809A Unspecified open wound, unspecified lower leg, initial encounter: Secondary | ICD-10-CM

## 2023-02-23 NOTE — Therapy (Signed)
OUTPATIENT PHYSICAL THERAPY TREATMENT   Patient Name: Marvin Carr MRN: [id number] DOB:28-Jun-1956, 67 y.o., male Today's Date: 02/23/2023   PCP: Benita Stabile MD REFERRING PROVIDER: Doreatha Massed, MD  END OF SESSION:  PT End of Session - 02/23/23 0954     Visit Number 4    Number of Visits 8    Date for PT Re-Evaluation 03/13/23    Authorization Type Primary: Medicare Secondary: [employer]    Progress Note Due on Visit 10    PT Start Time 0820    PT Stop Time 0904    PT Time Calculation (min) 44 min    Activity Tolerance Patient tolerated treatment well    Behavior During Therapy [facility] for tasks assessed/performed             Past Medical History:  Diagnosis Date   Anxiety    Bladder cancer (HCC) 09/11/2009   CAD (coronary artery disease), native coronary artery    a. Mildly elevated troponin 03/2013, cath with nonobstructive disease including 50% AV groove distal stenosis before large OM   Essential hypertension    Headache(784.0)    History of migraines   Hyperglycemia    Mixed hyperlipidemia    Neuropathy    Obesity    Port-A-Cath in place 09/30/2021   Pre-diabetes    Seasonal allergies    Sleep apnea    On CPAP   Past Surgical History:  Procedure Laterality Date   BIOPSY  09/23/2021   Procedure: BIOPSY;  Surgeon: Dolores Frame, MD;  [facility];  Service: Gastroenterology;;   COLONOSCOPY  06/28/2011   Procedure: COLONOSCOPY;  Surgeon: Malissa Hippo, MD;  [facility];  Service: Endoscopy;  Laterality: N/A;   COLONOSCOPY WITH PROPOFOL N/A 09/23/2021   Procedure: COLONOSCOPY WITH PROPOFOL;  Surgeon: Dolores Frame, MD;  [facility];  Service: Gastroenterology;  Laterality: N/A;  940   ESOPHAGOGASTRODUODENOSCOPY (EGD) WITH PROPOFOL N/A 09/23/2021   Procedure: ESOPHAGOGASTRODUODENOSCOPY (EGD) WITH PROPOFOL;  Surgeon: Dolores Frame, MD;  [facility];  Service:  Gastroenterology;  Laterality: N/A;   IR IMAGING GUIDED PORT INSERTION  09/28/2021   IR PARACENTESIS  09/28/2021   JOINT REPLACEMENT Right    hip   LEFT HEART CATHETERIZATION WITH CORONARY ANGIOGRAM N/A 03/31/2013   Procedure: LEFT HEART CATHETERIZATION WITH CORONARY ANGIOGRAM;  Surgeon: [name], MD;  Location: [facility] CATH LAB;  Service: Cardiovascular;  Laterality: N/A;   POLYPECTOMY  09/23/2021   Procedure: POLYPECTOMY;  Surgeon: Dolores Frame, MD;  [facility];  Service: Gastroenterology;;   TURBT  09/11/2009   Patient Active Problem List   Diagnosis Date Noted   Neuropathy associated with cancer (HCC) 02/22/2023   Edema due to hypoalbuminemia 02/22/2023   RLS (restless legs syndrome) 02/22/2023   History of insomnia 02/22/2023   History of restless legs syndrome 02/22/2023   Chronic skin ulcer (HCC) 02/22/2023   Obstructive sleep apnea 01/29/2023   Fever 06/04/2022   Sepsis due to cellulitis (HCC) 06/04/2022   Obesity    Depression    Pre-diabetes    Forgetfulness 03/29/2022   Port-A-Cath in place 09/30/2021   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   Lesion of liver 09/19/2021   Peritoneal lesion 09/19/2021   Constipation 09/19/2021   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    Patient and Family Stated Goals wounds to heal    Date of Onset  05/17/22    Prior Treatments neosporin, antibiotics    Pain Scale 0-10    Pain Score 7     Pain Location Leg    Wound Properties Date First Assessed: 02/13/23 Time First Assessed: 1030 Wound Type: Venous stasis ulcer Location: Pretibial Location Orientation: Right Wound Description (Comments): numerous small wounds/peeling skin/weeping Present on Admission: Yes   Wound Image Images linked: 1    Dressing Type Alginate;Gauze (Comment);Compression wrap   lotion and vaseline, alginate over blisters/wheeping, ABD pads to fill in void (no cotton available), kerlix, profore   Dressing Changed Changed    Dressing Status Dry   Induration, wheeping   Dressing Change Frequency PRN    Site / Wound Assessment Red;Yellow    Drainage Amount Minimal    Drainage Description Serosanguineous    Treatment Cleansed;Debridement (Selective)    Wound Properties Date First Assessed: 02/13/23 Time First Assessed: 1030 Wound Type: Venous stasis ulcer Location: Pretibial Location Orientation: Left Wound Description (Comments): numerous small wounds/ peeling skin/ weeping Present on Admission: Yes   Dressing Type Alginate;Gauze (Comment);Compression wrap   lotion and vaseline, alginate over blisters/wheeping, ABD pads to fill in void (no cotton available), kerlix, profore   Dressing Changed Changed    Dressing Status Dry   Induration, wheeping   Site / Wound Assessment Red;Yellow    Drainage Amount Scant    Drainage Description Serosanguineous    Treatment Cleansed;Debridement (Selective)    Selective Debridement (non-excisional) - Location bilateral LE    Selective Debridement (non-excisional) - Tools Used Forceps    Selective Debridement (non-excisional) - Tissue Removed dry skin    Wound Therapy - Clinical Statement see below    Wound Therapy - Functional Problem List ambulation, bathing    Factors Delaying/Impairing Wound Healing Multiple medical problems;Altered sensation    Hydrotherapy Plan  Debridement;Dressing change;Electrical stimulation;Pulsatile lavage with suction;Patient/family education;Ultrasonic wound therapy @35  KHz (+/- 3 KHz)    Wound Therapy - Frequency 2X / week    Wound Therapy - Current Recommendations PT    Wound Plan debride, dressing changes PRN    Dressing  lotion and vaseline, alginate over blisters/wheeping, ABD pads to fill in void (no cotton available), kerlix, profore               PATIENT EDUCATION: Education details: Patient educated on exam findings, POC, wound treatment, changing dressing if soiled, compression garments, removing top layer of dressing if too tight Person educated: Patient Education method: Explanation Education comprehension: verbalized understanding   HOME EXERCISE PROGRAM: N/a   GOALS: Goals reviewed with patient? Yes  SHORT TERM GOALS: Target date: 02/27/2023    Patients BLE edema to reduce to stop weeping so wounds can begin healing. Baseline: Goal status: INITIAL    LONG TERM GOALS: Target date: 03/13/2023    Wounds to be healed to reduce risk of infection. Baseline:  Goal status: INITIAL  2.  Patient will be able to transition to compression garments for edema management.  Baseline:  Goal status: INITIAL    ASSESSMENT:  CLINICAL IMPRESSION: Pt arrived with dressings on though have slid down LE with bottle neck, increased edema above dressings, induration, erythema, blisters and wheeping present.  Skin appears very raw and reports of increased pain today.  Pt encouraged to contact MD for antibiotics as increased redness this session, no heat noted.  LEs cleansed and moisturized well with selective debridement for removal of dry skin to promote healing.  Applied alginate over blister and wheeping areas to address drainage.  Profore applied with additional ABD pad for cone shape to assist with sliding for edema control.  EOS pt reports pain reduced and comfortable with dressings.  Pt will benefit from  manual to address induration and compression garments upon discharge.  Eval:  Patient a 67 y.o. y.o. male who was seen today for physical therapy evaluation and treatment for wounds to lower legs. Patient presents with bilateral LE edema, erythema, and several areas of wounds and weeping on bilateral LE. Cleansed/debrided peeling skin and scabbing from bilateral LE. Applied lotion and Vaseline to BLE followed by xeroform to skin irritation/wounds, 4x4, abd pads for shaping/drainage, and profore compression wrap to decrease edema. Patient will benefit from PT to promote wound healing.    OBJECTIVE IMPAIRMENTS: Abnormal gait, decreased mobility, increased edema, pain, and skin integrity .   ACTIVITY LIMITATIONS: standing, stairs, bathing, and locomotion level  PARTICIPATION LIMITATIONS: meal prep, cleaning, laundry, community activity, and yard work  PERSONAL FACTORS: 3+ comorbidities: cholangiocarcinoma with peritoneal carcinomatosis, bladder cancer, neuropathy  are also affecting patient's functional outcome.   REHAB POTENTIAL: Good  CLINICAL DECISION MAKING: Evolving/moderate complexity  EVALUATION COMPLEXITY: Moderate  PLAN: PT FREQUENCY: 2x/week  PT DURATION: 4 weeks  PLANNED INTERVENTIONS: Therapeutic exercises, Therapeutic activity, Neuromuscular re-education, Balance training, Gait training, Patient/Family education, Joint manipulation, Joint mobilization, Stair training, Orthotic/Fit training, DME instructions, Aquatic Therapy, Dry Needling, Electrical stimulation, Spinal manipulation, Spinal mobilization, Cryotherapy, Moist heat, Compression bandaging, scar mobilization, Splintting, Taping, Traction, Ultrasound, Ionotophoresis 4mg /ml Dexamethasone, and Manual therapy   PLAN FOR NEXT SESSION: debride, dressing changes, possible manual for edema  Becky Sax, LPTA/CLT; CBIS [phone]  Juel Burrow, PTA 02/23/2023, 10:22 AM

## 2023-02-26 ENCOUNTER — Encounter (HOSPITAL_COMMUNITY): Payer: Self-pay

## 2023-02-26 ENCOUNTER — Ambulatory Visit (HOSPITAL_COMMUNITY): Payer: Medicare Other

## 2023-02-26 DIAGNOSIS — R2689 Other abnormalities of gait and mobility: Secondary | ICD-10-CM

## 2023-02-26 DIAGNOSIS — S81809A Unspecified open wound, unspecified lower leg, initial encounter: Secondary | ICD-10-CM

## 2023-02-26 NOTE — Therapy (Signed)
OUTPATIENT PHYSICAL THERAPY TREATMENT   Patient Name: Marvin Carr MRN: [id number] DOB:1956/08/07, 67 y.o., male Today's Date: 02/26/2023   PCP: Benita Stabile MD REFERRING PROVIDER: Doreatha Massed, MD  END OF SESSION:  PT End of Session - 02/26/23 1253     Visit Number 5    Number of Visits 8    Date for PT Re-Evaluation 03/13/23    Authorization Type Primary: Medicare Secondary: [employer]    Progress Note Due on Visit 10    PT Start Time 1140    PT Stop Time 1230    PT Time Calculation (min) 50 min    Activity Tolerance Patient tolerated treatment well    Behavior During Therapy [facility] for tasks assessed/performed             Past Medical History:  Diagnosis Date   Anxiety    Bladder cancer (HCC) 09/11/2009   CAD (coronary artery disease), native coronary artery    a. Mildly elevated troponin 03/2013, cath with nonobstructive disease including 50% AV groove distal stenosis before large OM   Essential hypertension    Headache(784.0)    History of migraines   Hyperglycemia    Mixed hyperlipidemia    Neuropathy    Obesity    Port-A-Cath in place 09/30/2021   Pre-diabetes    Seasonal allergies    Sleep apnea    On CPAP   Past Surgical History:  Procedure Laterality Date   BIOPSY  09/23/2021   Procedure: BIOPSY;  Surgeon: Dolores Frame, MD;  [facility];  Service: Gastroenterology;;   COLONOSCOPY  06/28/2011   Procedure: COLONOSCOPY;  Surgeon: Malissa Hippo, MD;  [facility];  Service: Endoscopy;  Laterality: N/A;   COLONOSCOPY WITH PROPOFOL N/A 09/23/2021   Procedure: COLONOSCOPY WITH PROPOFOL;  Surgeon: Dolores Frame, MD;  [facility];  Service: Gastroenterology;  Laterality: N/A;  940   ESOPHAGOGASTRODUODENOSCOPY (EGD) WITH PROPOFOL N/A 09/23/2021   Procedure: ESOPHAGOGASTRODUODENOSCOPY (EGD) WITH PROPOFOL;  Surgeon: Dolores Frame, MD;  [facility];  Service:  Gastroenterology;  Laterality: N/A;   IR IMAGING GUIDED PORT INSERTION  09/28/2021   IR PARACENTESIS  09/28/2021   JOINT REPLACEMENT Right    hip   LEFT HEART CATHETERIZATION WITH CORONARY ANGIOGRAM N/A 03/31/2013   Procedure: LEFT HEART CATHETERIZATION WITH CORONARY ANGIOGRAM;  Surgeon: [name], MD;  Location: [facility] CATH LAB;  Service: Cardiovascular;  Laterality: N/A;   POLYPECTOMY  09/23/2021   Procedure: POLYPECTOMY;  Surgeon: Dolores Frame, MD;  [facility];  Service: Gastroenterology;;   TURBT  09/11/2009   Patient Active Problem List   Diagnosis Date Noted   Neuropathy associated with cancer (HCC) 02/22/2023   Edema due to hypoalbuminemia 02/22/2023   RLS (restless legs syndrome) 02/22/2023   History of insomnia 02/22/2023   History of restless legs syndrome 02/22/2023   Chronic skin ulcer (HCC) 02/22/2023   Obstructive sleep apnea 01/29/2023   Fever 06/04/2022   Sepsis due to cellulitis (HCC) 06/04/2022   Obesity    Depression    Pre-diabetes    Forgetfulness 03/29/2022   Port-A-Cath in place 09/30/2021   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   Lesion of liver 09/19/2021   Peritoneal lesion 09/19/2021   Constipation 09/19/2021   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   Degeneration of intervertebral disc of lumbar region 09/01/2021   Neuropathy    CAD (coronary artery disease), native coronary artery 03/30/2013   Essential hypertension  03/30/2013   Mixed hyperlipidemia 03/30/2013    ONSET DATE: Fall 2023  REFERRING DIAG: wounds to lower legs  THERAPY DIAG:  Other abnormalities of gait and mobility  Multiple open wounds of lower leg, unspecified laterality, initial encounter  Rationale for Evaluation and Treatment: Rehabilitation     Wound Therapy - 02/26/23 0001     Subjective Pt arrived without dressings, reports he noticed some swelling above and removed Lt LE on Saturday and Rt prior session to shower.   Called MD and has apt on [address] for hyper baric treatment.    Patient and Family Stated Goals wounds to heal    Date of Onset 05/17/22    Prior Treatments neosporin, antibiotics    Pain Scale 0-10    Pain Score 0-No pain    Wound Properties Date First Assessed: 02/13/23 Time First Assessed: 1030 Wound Type: Venous stasis ulcer Location: Pretibial Location Orientation: Right Wound Description (Comments): numerous small wounds/peeling skin/weeping Present on Admission: Yes   Wound Image Images linked: 1    Dressing Type Gauze (Comment);Compression wrap;Impregnated gauze (petrolatum)    Dressing Changed Changed    Dressing Status Dry    Dressing Change Frequency PRN    Site / Wound Assessment Red;Yellow    Drainage Amount Minimal    Drainage Description Serosanguineous    Treatment Cleansed;Debridement (Selective)    Wound Properties Date First Assessed: 02/13/23 Time First Assessed: 1030 Wound Type: Venous stasis ulcer Location: Pretibial Location Orientation: Left Wound Description (Comments): numerous small wounds/ peeling skin/ weeping Present on Admission: Yes   Dressing Type Alginate;Gauze (Comment);Compression wrap    Dressing Changed Changed    Dressing Status Dry   Induration, wheeping, blister present on Lt LE   Site / Wound Assessment Red;Yellow    Drainage Amount Scant    Drainage Description Serosanguineous    Treatment Cleansed;Debridement (Selective)    Selective Debridement (non-excisional) - Location bilateral LE    Selective Debridement (non-excisional) - Tools Used Forceps    Selective Debridement (non-excisional) - Tissue Removed dry skin    Wound Therapy - Clinical Statement see below    Wound Therapy - Functional Problem List ambulation, bathing    Factors Delaying/Impairing Wound Healing Multiple medical problems;Altered sensation    Hydrotherapy Plan Debridement;Dressing change;Electrical stimulation;Pulsatile lavage with suction;Patient/family  education;Ultrasonic wound therapy @35  KHz (+/- 3 KHz)    Wound Therapy - Frequency 2X / week    Wound Therapy - Current Recommendations PT    Wound Plan debride, dressing changes PRN    Dressing  Lt: lotion and vaseline, alginate over blisters/wheeping, ABD pads to fill in void (no cotton available), kerlix, profore    Dressing Rt: lotion and vaseline, xeroform, ABD pads to fill in void (no cotton available), kerlix, profore               PATIENT EDUCATION: Education details: Patient educated on exam findings, POC, wound treatment, changing dressing if soiled, compression garments, removing top layer of dressing if too tight Person educated: Patient Education method: Explanation Education comprehension: verbalized understanding   HOME EXERCISE PROGRAM: N/a   GOALS: Goals reviewed with patient? Yes  SHORT TERM GOALS: Target date: 02/27/2023    Patients BLE edema to reduce to stop weeping so wounds can begin healing. Baseline: Goal status: INITIAL    LONG TERM GOALS: Target date: 03/13/2023    Wounds to be healed to reduce risk of infection. Baseline:  Goal status: INITIAL  2.  Patient will be able to  transition to compression garments for edema management.  Baseline:  Goal status: INITIAL    ASSESSMENT:  CLINICAL IMPRESSION: Pt arrived without dressings on and presents with very dry flaky skin.  Pt educated on importance of keeping wounds covered to promote healing.  LEs cleansed and moisturized well with selective debridement for removal of dry skin to promote healing.  LE continues to present with induration, redness raw skin, wheeping and blisters present on Lt LE, no heat palpated.  Continued with alginate to address blisters and wheeping on Lt and xeroform on Rt to address raw skin.  Reviewed benefits with compression garments for edema control and to reduce blisters, measurements taken and given ETI handout for proper fitted garments.  Pt stated he has an apt  at [facility] for hyper baric therapy.  Encouraged to discuss possibility of antibiotics to address increased redness last couple weeks and to inform [facility] if we should DC'd for care at other locations.  Pt will benefit from manual to address induration as time allows and compression garments upon discharge.      Eval:  Patient a 67 y.o. y.o. male who was seen today for physical therapy evaluation and treatment for wounds to lower legs. Patient presents with bilateral LE edema, erythema, and several areas of wounds and weeping on bilateral LE. Cleansed/debrided peeling skin and scabbing from bilateral LE. Applied lotion and Vaseline to BLE followed by xeroform to skin irritation/wounds, 4x4, abd pads for shaping/drainage, and profore compression wrap to decrease edema. Patient will benefit from PT to promote wound healing.    OBJECTIVE IMPAIRMENTS: Abnormal gait, decreased mobility, increased edema, pain, and skin integrity .   ACTIVITY LIMITATIONS: standing, stairs, bathing, and locomotion level  PARTICIPATION LIMITATIONS: meal prep, cleaning, laundry, community activity, and yard work  PERSONAL FACTORS: 3+ comorbidities: cholangiocarcinoma with peritoneal carcinomatosis, bladder cancer, neuropathy  are also affecting patient's functional outcome.   REHAB POTENTIAL: Good  CLINICAL DECISION MAKING: Evolving/moderate complexity  EVALUATION COMPLEXITY: Moderate  PLAN: PT FREQUENCY: 2x/week  PT DURATION: 4 weeks  PLANNED INTERVENTIONS: Therapeutic exercises, Therapeutic activity, Neuromuscular re-education, Balance training, Gait training, Patient/Family education, Joint manipulation, Joint mobilization, Stair training, Orthotic/Fit training, DME instructions, Aquatic Therapy, Dry Needling, Electrical stimulation, Spinal manipulation, Spinal mobilization, Cryotherapy, Moist heat, Compression bandaging, scar mobilization, Splintting, Taping, Traction, Ultrasound, Ionotophoresis 4mg /ml  Dexamethasone, and Manual therapy   PLAN FOR NEXT SESSION: debride, dressing changes, possible manual for edema  Becky Sax, LPTA/CLT; CBIS [phone]  Juel Burrow, PTA 02/26/2023, 12:54 PM

## 2023-02-28 ENCOUNTER — Other Ambulatory Visit: Payer: Self-pay | Admitting: Hematology

## 2023-02-28 ENCOUNTER — Ambulatory Visit (HOSPITAL_COMMUNITY): Payer: Medicare Other

## 2023-02-28 ENCOUNTER — Encounter (HOSPITAL_BASED_OUTPATIENT_CLINIC_OR_DEPARTMENT_OTHER): Payer: Medicare Other | Attending: Physician Assistant | Admitting: Physician Assistant

## 2023-02-28 DIAGNOSIS — G629 Polyneuropathy, unspecified: Secondary | ICD-10-CM | POA: Diagnosis not present

## 2023-02-28 DIAGNOSIS — G4730 Sleep apnea, unspecified: Secondary | ICD-10-CM | POA: Diagnosis not present

## 2023-02-28 DIAGNOSIS — I890 Lymphedema, not elsewhere classified: Secondary | ICD-10-CM | POA: Diagnosis not present

## 2023-02-28 DIAGNOSIS — I872 Venous insufficiency (chronic) (peripheral): Secondary | ICD-10-CM | POA: Insufficient documentation

## 2023-02-28 DIAGNOSIS — Z8551 Personal history of malignant neoplasm of bladder: Secondary | ICD-10-CM | POA: Insufficient documentation

## 2023-02-28 DIAGNOSIS — L97822 Non-pressure chronic ulcer of other part of left lower leg with fat layer exposed: Secondary | ICD-10-CM | POA: Diagnosis not present

## 2023-02-28 DIAGNOSIS — I10 Essential (primary) hypertension: Secondary | ICD-10-CM | POA: Diagnosis not present

## 2023-02-28 DIAGNOSIS — I2510 Atherosclerotic heart disease of native coronary artery without angina pectoris: Secondary | ICD-10-CM | POA: Insufficient documentation

## 2023-02-28 DIAGNOSIS — I87333 Chronic venous hypertension (idiopathic) with ulcer and inflammation of bilateral lower extremity: Secondary | ICD-10-CM | POA: Insufficient documentation

## 2023-02-28 DIAGNOSIS — L97812 Non-pressure chronic ulcer of other part of right lower leg with fat layer exposed: Secondary | ICD-10-CM | POA: Diagnosis not present

## 2023-02-28 NOTE — Progress Notes (Signed)
TORREON, MUSICK ([id number]) [id number] Page 1 of 14 Visit Report for 02/28/2023 Allergy List Details Patient Name: Date of Service: Marvin Carr RD 02/28/2023 8:30 A M Medical Record Number: [id number] Patient Account Number: [id number] Date of Birth/Sex: Treating RN: [birth year] (67 y.o. Tammy Sours Primary Care Everest Hacking: Nita Sells Other Clinician: Referring Charliene Inoue: Treating Devlon Dosher/Extender: Maryan Puls in Treatment: 0 Allergies Active Allergies No Known Drug Allergies Allergy Notes Electronic Signature(s) Signed: 02/28/2023 3:27:24 PM By: Shawn Stall RN, BSN Entered By: Shawn Stall on 02/27/2023 12:14:19 -------------------------------------------------------------------------------- Arrival Information Details Patient Name: Date of Service: Marvin Carr RD 02/28/2023 8:30 A M Medical Record Number: [id number] Patient Account Number: [id number] Date of Birth/Sex: Treating RN: [birth year] (67 y.o. Tammy Sours Primary Care Jsaon Yoo: Nita Sells Other Clinician: Referring Mayson Mcneish: Treating Ricquel Foulk/Extender: Maryan Puls in Treatment: 0 Visit Information Patient Arrived: Ambulatory Arrival Time: 08:49 Accompanied By: family member Transfer Assistance: None Patient Identification Verified: Yes Secondary Verification Process Completed: Yes Patient Requires Transmission-Based Precautions: No Patient Has Alerts: No Electronic Signature(s) Signed: 02/28/2023 3:27:24 PM By: Shawn Stall RN, BSN Entered By: Shawn Stall on 02/28/2023 08:50:23 -------------------------------------------------------------------------------- Clinic Level of Care Assessment Details Patient Name: Date of Service: Marvin Carr RD 02/28/2023 8:30 A M Medical Record Number: [id number] Patient Account Number: [id number] Marvin Carr, Marvin Carr ([id number]) [phone].pdf Page 2 of 14 Date of  Birth/Sex: Treating RN: 24-Jun-1956 (67 y.o. Tammy Sours Primary Care Tresean Mattix: Nita Sells Other Clinician: Referring Sydni Elizarraraz: Treating Carlissa Pesola/Extender: Maryan Puls in Treatment: 0 Clinic Level of Care Assessment Items TOOL 1 Quantity Score X- 1 0 Use when EandM and Procedure is performed on INITIAL visit ASSESSMENTS - Nursing Assessment / Reassessment X- 1 20 General Physical Exam (combine w/ comprehensive assessment (listed just below) when performed on new pt. evals) X- 1 25 Comprehensive Assessment (HX, ROS, Risk Assessments, Wounds Hx, etc.) ASSESSMENTS - Wound and Skin Assessment / Reassessment X- 1 10 Dermatologic / Skin Assessment (not related to wound area) ASSESSMENTS - Ostomy and/or Continence Assessment and Care []  - 0 Incontinence Assessment and Management []  - 0 Ostomy Care Assessment and Management (repouching, etc.) PROCESS - Coordination of Care []  - 0 Simple Patient / Family Education for ongoing care X- 1 20 Complex (extensive) Patient / Family Education for ongoing care X- 1 10 Staff obtains Chiropractor, Records, T Results / Process Orders est []  - 0 Staff telephones HHA, Nursing Homes / Clarify orders / etc []  - 0 Routine Transfer to another Facility (non-emergent condition) []  - 0 Routine Hospital Admission (non-emergent condition) X- 1 15 New Admissions / Manufacturing engineer / Ordering NPWT Apligraf, etc. , []  - 0 Emergency Hospital Admission (emergent condition) PROCESS - Special Needs []  - 0 Pediatric / Minor Patient Management []  - 0 Isolation Patient Management []  - 0 Hearing / Language / Visual special needs []  - 0 Assessment of Community assistance (transportation, D/C planning, etc.) []  - 0 Additional assistance / Altered mentation []  - 0 Support Surface(s) Assessment (bed, cushion, seat, etc.) INTERVENTIONS - Miscellaneous []  - 0 External ear exam []  - 0 Patient Transfer (multiple staff / Water quality scientist / Similar devices) []  - 0 Simple Staple / Suture removal (25 or less) []  - 0 Complex Staple / Suture removal (26 or more) []  - 0 Hypo/Hyperglycemic Management (do not check if billed separately) X- 1 15 Ankle / Brachial Index (ABI) - do not check if billed separately Has the patient been  seen at the hospital within the last three years: Yes Total Score: 115 Level Of Care: New/Established - Level 3 Electronic Signature(s) Signed: 02/28/2023 3:27:24 PM By: Shawn Stall RN, BSN Entered By: Shawn Stall on 02/28/2023 09:38:58 Marvin Carr ([id number]) [id number] Page 3 of 14 -------------------------------------------------------------------------------- Compression Therapy Details Patient Name: Date of Service: Marvin Carr RD 02/28/2023 8:30 A M Medical Record Number: [id number] Patient Account Number: [id number] Date of Birth/Sex: Treating RN: [birth year] (67 y.o. Tammy Sours Primary Care Nyia Tsao: Nita Sells Other Clinician: Referring Chace Klippel: Treating Antara Brecheisen/Extender: Maryan Puls in Treatment: 0 Compression Therapy Performed for Wound Assessment: Wound #1 Right,Lateral Lower Leg Performed By: Clinician Shawn Stall, RN Compression Type: Double Layer Post Procedure Diagnosis Same as Pre-procedure Electronic Signature(s) Signed: 02/28/2023 3:27:24 PM By: Shawn Stall RN, BSN Entered By: Shawn Stall on 02/28/2023 09:38:43 -------------------------------------------------------------------------------- Compression Therapy Details Patient Name: Date of Service: Marvin Carr RD 02/28/2023 8:30 A M Medical Record Number: [id number] Patient Account Number: [id number] Date of Birth/Sex: Treating RN: Apr 04, 1956 (67 y.o. Tammy Sours Primary Care Zorion Nims: Nita Sells Other Clinician: Referring Marcas Bowsher: Treating Ardel Jagger/Extender: Maryan Puls in Treatment: 0 Compression Therapy Performed  for Wound Assessment: Wound #3 Left,Posterior Lower Leg Performed By: Clinician Shawn Stall, RN Compression Type: Double Layer Post Procedure Diagnosis Same as Pre-procedure Electronic Signature(s) Signed: 02/28/2023 3:27:24 PM By: Shawn Stall RN, BSN Entered By: Shawn Stall on 02/28/2023 09:38:43 -------------------------------------------------------------------------------- Compression Therapy Details Patient Name: Date of Service: Marvin Carr RD 02/28/2023 8:30 A M Medical Record Number: [id number] Patient Account Number: [id number] Date of Birth/Sex: Treating RN: June 08, 1956 (67 y.o. Tammy Sours Primary Care Girlie Veltri: Nita Sells Other Clinician: Referring Boston Catarino: Treating Micaila Ziemba/Extender: Maryan Puls in Treatment: 0 Compression Therapy Performed for Wound Assessment: Wound #2 Right,Posterior Lower Leg Performed By: Clinician Shawn Stall, RN Compression Type: Double Layer Post Procedure Diagnosis Same as Pre-procedure Electronic Signature(s) Signed: 02/28/2023 3:27:24 PM By: Shawn Stall RN, BSN [place], Gerlene Burdock ([id number]) By: Shawn Stall RN, BSN [phone].pdf Page 4 of 14 Signed: 02/28/2023 3:27:24 PM Entered By: Shawn Stall on 02/28/2023 09:38:43 -------------------------------------------------------------------------------- Compression Therapy Details Patient Name: Date of Service: Marvin Carr RD 02/28/2023 8:30 A M Medical Record Number: [id number] Patient Account Number: [id number] Date of Birth/Sex: Treating RN: 08-16-1956 (67 y.o. Tammy Sours Primary Care Kayann Maj: Nita Sells Other Clinician: Referring Lanore Renderos: Treating Ahliya Glatt/Extender: Maryan Puls in Treatment: 0 Compression Therapy Performed for Wound Assessment: Wound #4 Left,Lateral Lower Leg Performed By: Clinician Shawn Stall, RN Compression Type: Double Layer Post Procedure Diagnosis Same as  Pre-procedure Electronic Signature(s) Signed: 02/28/2023 3:27:24 PM By: Shawn Stall RN, BSN Entered By: Shawn Stall on 02/28/2023 09:38:43 -------------------------------------------------------------------------------- Encounter Discharge Information Details Patient Name: Date of Service: Marvin Carr RD 02/28/2023 8:30 A M Medical Record Number: [id number] Patient Account Number: [id number] Date of Birth/Sex: Treating RN: 14-Jul-1956 (67 y.o. Tammy Sours Primary Care Oluwasemilore Bahl: Nita Sells Other Clinician: Referring Guadalupe Kerekes: Treating Kerra Guilfoil/Extender: Maryan Puls in Treatment: 0 Encounter Discharge Information Items Discharge Condition: Stable Ambulatory Status: Ambulatory Discharge Destination: Home Transportation: Private Auto Accompanied By: friend Schedule Follow-up Appointment: Yes Clinical Summary of Care: Electronic Signature(s) Signed: 02/28/2023 3:27:24 PM By: Shawn Stall RN, BSN Entered By: Shawn Stall on 02/28/2023 09:39:28 -------------------------------------------------------------------------------- Lower Extremity Assessment Details Patient Name: Date of Service: Marvin Carr RD 02/28/2023 8:30 A M Medical Record Number: [id number] Patient Account Number: [id number] Date of Birth/Sex:  Treating RN: Feb 20, 1956 (67 y.o. Tammy Sours Primary Care Ronelle Smallman: Nita Sells Other Clinician: Rufina Carr ([id number]) [id number] Page 5 of 14 Referring Sheetal Lyall: Treating Kadien Lineman/Extender: Maryan Puls in Treatment: 0 Edema Assessment Assessed: [Left: Yes] [Right: Yes] Edema: [Left: Yes] [Right: Yes] Calf Left: Right: Point of Measurement: 33 cm From Medial Instep 47 cm 43 cm Ankle Left: Right: Point of Measurement: 12 cm From Medial Instep 27 cm 27 cm Knee To Floor Left: Right: From Medial Instep 44 cm 44 cm Vascular Assessment Pulses: Dorsalis Pedis Palpable: [Left:Yes]  [Right:Yes] Doppler Audible: [Left:Yes] [Right:Yes] Posterior Tibial Palpable: [Left:Yes] [Right:Yes] Doppler Audible: [Left:Yes] [Right:Yes] Blood Pressure: Brachial: [Left:120] [Right:120] Ankle: [Left:Dorsalis Pedis: 162 1.35] [Right:Dorsalis Pedis: 180 1.50] Electronic Signature(s) Signed: 02/28/2023 3:27:24 PM By: Shawn Stall RN, BSN Entered By: Shawn Stall on 02/28/2023 09:12:00 -------------------------------------------------------------------------------- Multi-Disciplinary Care Plan Details Patient Name: Date of Service: Marvin Carr RD 02/28/2023 8:30 A M Medical Record Number: [id number] Patient Account Number: [id number] Date of Birth/Sex: Treating RN: April 03, 1956 (67 y.o. Tammy Sours Primary Care Payton Prinsen: Nita Sells Other Clinician: Referring Lotta Frankenfield: Treating David Towson/Extender: Maryan Puls in Treatment: 0 Active Inactive Orientation to the Wound Care Program Nursing Diagnoses: Knowledge deficit related to the wound healing center program Goals: Patient/caregiver will verbalize understanding of the Wound Healing Center Program Date Initiated: 02/28/2023 Target Resolution Date: 03/16/2023 Goal Status: Active Interventions: Provide education on orientation to the wound center Notes: Marvin Carr, Marvin Carr ([id number]) [id number] Page 6 of 14 Venous Leg Ulcer Nursing Diagnoses: Knowledge deficit related to disease process and management Potential for venous Insuffiency (use before diagnosis confirmed) Goals: Patient will maintain optimal edema control Date Initiated: 02/28/2023 Target Resolution Date: 03/16/2023 Goal Status: Active Interventions: Assess peripheral edema status every visit. Compression as ordered Provide education on venous insufficiency Treatment Activities: Therapeutic compression applied : 02/28/2023 Notes: Wound/Skin Impairment Nursing Diagnoses: Knowledge deficit related to  ulceration/compromised skin integrity Goals: Patient/caregiver will verbalize understanding of skin care regimen Date Initiated: 02/28/2023 Target Resolution Date: 03/16/2023 Goal Status: Active Ulcer/skin breakdown will heal within 14 weeks Date Initiated: 02/28/2023 Target Resolution Date: 05/18/2023 Goal Status: Active Interventions: Assess patient/caregiver ability to perform ulcer/skin care regimen upon admission and as needed Assess ulceration(s) every visit Provide education on ulcer and skin care Treatment Activities: Skin care regimen initiated : 02/28/2023 Topical wound management initiated : 02/28/2023 Notes: Electronic Signature(s) Signed: 02/28/2023 3:27:24 PM By: Shawn Stall RN, BSN Entered By: Shawn Stall on 02/28/2023 09:36:01 -------------------------------------------------------------------------------- Pain Assessment Details Patient Name: Date of Service: Marvin Carr RD 02/28/2023 8:30 A M Medical Record Number: [id number] Patient Account Number: [id number] Date of Birth/Sex: Treating RN: 20-Jul-1956 (67 y.o. Tammy Sours Primary Care Jonte Shiller: Nita Sells Other Clinician: Referring Tashya Alberty: Treating Valori Hollenkamp/Extender: Maryan Puls in Treatment: 0 Active Problems Location of Pain Severity and Description of Pain Patient Has Paino Yes Site Locations Pain Location: Marvin Carr, Marvin Carr ([id number]) [id number] Page 7 of 14 Pain Location: Generalized Pain, Pain in Ulcers Rate the pain. Current Pain Level: 7 Character of Pain Describe the Pain: Heavy, Sharp Pain Management and Medication Current Pain Management: Medication: No Cold Application: No Rest: No Massage: No Activity: No T.E.N.S.: No Heat Application: No Leg drop or elevation: No Is the Current Pain Management Adequate: Adequate How does your wound impact your activities of daily livingo Sleep: No Bathing: No Appetite: No Relationship With  Others: No Bladder Continence: No Emotions: No Bowel Continence: No Work: No Toileting: No Drive: No  Dressing: No Hobbies: No Notes per patient felt like "1000 mosquito bites at once." Electronic Signature(s) Signed: 02/28/2023 3:27:24 PM By: Shawn Stall RN, BSN Entered By: Shawn Stall on 02/28/2023 08:52:23 -------------------------------------------------------------------------------- Patient/Caregiver Education Details Patient Name: Date of Service: Marvin Carr RD 6/19/2024andnbsp8:30 A M Medical Record Number: [id number] Patient Account Number: [id number] Date of Birth/Gender: Treating RN: 1956-05-20 (67 y.o. Tammy Sours Primary Care Physician: Nita Sells Other Clinician: Referring Physician: Treating Physician/Extender: Maryan Puls in Treatment: 0 Education Assessment Education Provided To: Patient Education Topics Provided Venous: Handouts: Controlling Swelling with Compression Stockings Methods: Explain/Verbal, Printed Responses: Reinforcements needed Welcome T [facility]-New Patient PacketPARRIS, Marvin Carr ([id number]) [id number] Page 8 of 14 Handouts: [facility] P[facility] form, Welcome T [facility] o Methods: Explain/Verbal, Printed Responses: Reinforcements needed Electronic Signature(s) Signed: 02/28/2023 3:27:24 PM By: Shawn Stall RN, BSN Entered By: Shawn Stall on 02/28/2023 09:36:24 -------------------------------------------------------------------------------- Wound Assessment Details Patient Name: Date of Service: Marvin Carr RD 02/28/2023 8:30 A M Medical Record Number: [id number] Patient Account Number: [id number] Date of Birth/Sex: Treating RN: 30-Jan-1956 (67 y.o. Tammy Sours Primary Care Michaell Grider: Nita Sells Other Clinician: Referring Leibish Mcgregor: Treating Emmett Bracknell/Extender: Maryan Puls in Treatment: 0 Wound Status Wound  Number: 1 Primary Venous Leg Ulcer Etiology: Wound Location: Right, Lateral Lower Leg Secondary Lymphedema Wounding Event: Gradually Appeared Etiology: Date Acquired: 05/12/2022 Wound Open Weeks Of Treatment: 0 Status: Clustered Wound: No Comorbid Lymphedema, Sleep Apnea, Coronary Artery Disease, History: Hypertension, Neuropathy, Received Chemotherapy Photos Wound Measurements Length: (cm) 1.4 Width: (cm) 1 Depth: (cm) 0.1 Area: (cm) 1.1 Volume: (cm) 0.11 % Reduction in Area: % Reduction in Volume: Epithelialization: Small (1-33%) Tunneling: No Undermining: No Wound Description Classification: Full Thickness Without Exposed Support Wound Margin: Distinct, outline attached Exudate Amount: Medium Exudate Type: Serosanguineous Exudate Color: red, brown Structures Foul Odor After Cleansing: No Slough/Fibrino No Wound Bed Granulation Amount: Large (67-100%) Exposed Structure Granulation Quality: Red Fascia Exposed: No Fat Layer (Subcutaneous Tissue) Exposed: Yes Tendon Exposed: No Muscle Exposed: No Joint Exposed: No Bone Exposed: No [address] Marvin Carr ([id number]) [id number] Page 9 of 14 Texture Color No Abnormalities Noted: No No Abnormalities Noted: No Callus: No Atrophie Blanche: No Crepitus: No Cyanosis: No Excoriation: No Ecchymosis: No Induration: No Erythema: No Rash: No Hemosiderin Staining: Yes Scarring: No Mottled: No Pallor: No Moisture Rubor: No No Abnormalities Noted: No Dry / Scaly: No Maceration: No Treatment Notes Wound #1 (Lower Leg) Wound Laterality: Right, Lateral Cleanser Soap and Water Discharge Instruction: May shower and wash wound with dial antibacterial soap and water prior to dressing change. Vashe 5.8 (oz) Discharge Instruction: Cleanse the wound with Vashe prior to applying a clean dressing using gauze sponges, not tissue or cotton balls. Peri-Wound Care Triamcinolone 15  (g) Discharge Instruction: Use triamcinolone 15 (g) as directed Sween Lotion (Moisturizing lotion) Discharge Instruction: Apply moisturizing lotion as directed Topical Primary Dressing Maxorb Extra Ag+ Alginate Dressing, 4x4.75 (in/in) Discharge Instruction: Apply to wound bed as instructed Secondary Dressing ABD Pad, 8x10 Discharge Instruction: Apply over primary dressing as directed. Secured With Compression Wrap Urgo K2, (equivalent to a 4 layer) two layer compression system, regular Discharge Instruction: Apply Urgo K2 as directed (alternative to 4 layer compression). Compression Stockings Add-Ons Electronic Signature(s) Signed: 02/28/2023 3:27:24 PM By: Shawn Stall RN, BSN Entered By: Shawn Stall on 02/28/2023 09:15:20 -------------------------------------------------------------------------------- Wound Assessment Details Patient Name: Date of Service: Marvin Carr RD 02/28/2023  8:30 A M Medical Record Number: [id number] Patient Account Number: [id number] Date of Birth/Sex: Treating RN: 07/23/1956 (67 y.o. Tammy Sours Primary Care Jaylani Mcguinn: Nita Sells Other Clinician: Referring Prophet Renwick: Treating Suraj Ramdass/Extender: Loyola Mast, Vivianne Spence in Treatment: 0 Wound Status Wound Number: 2 Primary Venous Leg Ulcer Etiology: Wound Location: Right, Posterior Lower Leg Secondary Lymphedema Wounding Event: Gradually Appeared Etiology: JAMAURIE, PFENNING ([id number]) [id number] Page 10 of 14 Etiology: Date Acquired: 06/08/2022 Wound Open Weeks Of Treatment: 0 Status: Clustered Wound: No Comorbid Lymphedema, Sleep Apnea, Coronary Artery Disease, History: Hypertension, Neuropathy, Received Chemotherapy Photos Wound Measurements Length: (cm) 1.2 Width: (cm) 1.7 Depth: (cm) 0.1 Area: (cm) 1.602 Volume: (cm) 0.16 % Reduction in Area: % Reduction in Volume: Epithelialization: None Tunneling: No Undermining: No Wound  Description Classification: Full Thickness Without Exposed Support Structures Wound Margin: Distinct, outline attached Exudate Amount: Medium Exudate Type: Serosanguineous Exudate Color: red, brown Foul Odor After Cleansing: No Slough/Fibrino No Wound Bed Granulation Amount: Large (67-100%) Exposed Structure Granulation Quality: Red Fascia Exposed: No Necrotic Amount: None Present (0%) Fat Layer (Subcutaneous Tissue) Exposed: Yes Tendon Exposed: No Muscle Exposed: No Joint Exposed: No Bone Exposed: No Periwound Skin Texture Texture Color No Abnormalities Noted: No No Abnormalities Noted: No Callus: No Atrophie Blanche: No Crepitus: No Cyanosis: No Excoriation: No Ecchymosis: No Induration: No Erythema: No Rash: No Hemosiderin Staining: Yes Scarring: No Mottled: No Pallor: No Moisture Rubor: No No Abnormalities Noted: No Dry / Scaly: No Temperature / Pain Maceration: No Temperature: No Abnormality Treatment Notes Wound #2 (Lower Leg) Wound Laterality: Right, Posterior Cleanser Soap and Water Discharge Instruction: May shower and wash wound with dial antibacterial soap and water prior to dressing change. Vashe 5.8 (oz) Discharge Instruction: Cleanse the wound with Vashe prior to applying a clean dressing using gauze sponges, not tissue or cotton balls. Peri-Wound Care Triamcinolone 15 (g) Discharge Instruction: Use triamcinolone 15 (g) as directed Sween Lotion (Moisturizing lotion) Discharge Instruction: Apply moisturizing lotion as directed Marvin Carr, Marvin Carr ([id number]) [id number] Page 11 of 14 Topical Primary Dressing Maxorb Extra Ag+ Alginate Dressing, 4x4.75 (in/in) Discharge Instruction: Apply to wound bed as instructed Secondary Dressing ABD Pad, 8x10 Discharge Instruction: Apply over primary dressing as directed. Secured With Compression Wrap Urgo K2, (equivalent to a 4 layer) two layer compression system,  regular Discharge Instruction: Apply Urgo K2 as directed (alternative to 4 layer compression). Compression Stockings Add-Ons Electronic Signature(s) Signed: 02/28/2023 3:27:24 PM By: Shawn Stall RN, BSN Entered By: Shawn Stall on 02/28/2023 09:15:50 -------------------------------------------------------------------------------- Wound Assessment Details Patient Name: Date of Service: Marvin Carr RD 02/28/2023 8:30 A M Medical Record Number: [id number] Patient Account Number: [id number] Date of Birth/Sex: Treating RN: Mar 06, 1956 (67 y.o. Tammy Sours Primary Care Sterling Mondo: Nita Sells Other Clinician: Referring Mahika Vanvoorhis: Treating Allean Montfort/Extender: Maryan Puls in Treatment: 0 Wound Status Wound Number: 3 Primary Venous Leg Ulcer Etiology: Wound Location: Left, Posterior Lower Leg Secondary Lymphedema Wounding Event: Gradually Appeared Etiology: Date Acquired: 06/08/2022 Wound Open Weeks Of Treatment: 0 Status: Clustered Wound: No Comorbid Lymphedema, Sleep Apnea, Coronary Artery Disease, History: Hypertension, Neuropathy, Received Chemotherapy Photos Wound Measurements Length: (cm) 0.7 Width: (cm) 0.5 Depth: (cm) 0.1 Area: (cm) 0.275 Volume: (cm) 0.027 % Reduction in Area: % Reduction in Volume: Epithelialization: Small (1-33%) Tunneling: No Undermining: No Wound Description Classification: Full Thickness Without Exposed Support Structures Wound Margin: Distinct, outline attached Marvin Carr ([id number]) Exudate Amount: Medium Exudate Type: Serosanguineous Exudate Color: red, brown Foul Odor After Cleansing: No Slough/Fibrino No [phone]  Page 12 of 14 Wound Bed Granulation Amount: Large (67-100%) Exposed Structure Granulation Quality: Red Fascia Exposed: No Necrotic Amount: None Present (0%) Fat Layer (Subcutaneous Tissue) Exposed: Yes Tendon Exposed: No Muscle Exposed: No Joint Exposed: No Bone  Exposed: No Periwound Skin Texture Texture Color No Abnormalities Noted: No No Abnormalities Noted: No Callus: No Atrophie Blanche: No Crepitus: No Cyanosis: No Excoriation: No Ecchymosis: No Induration: No Erythema: No Rash: No Hemosiderin Staining: Yes Scarring: No Mottled: No Pallor: No Moisture Rubor: No No Abnormalities Noted: No Dry / Scaly: No Temperature / Pain Maceration: No Temperature: No Abnormality Treatment Notes Wound #3 (Lower Leg) Wound Laterality: Left, Posterior Cleanser Soap and Water Discharge Instruction: May shower and wash wound with dial antibacterial soap and water prior to dressing change. Vashe 5.8 (oz) Discharge Instruction: Cleanse the wound with Vashe prior to applying a clean dressing using gauze sponges, not tissue or cotton balls. Peri-Wound Care Triamcinolone 15 (g) Discharge Instruction: Use triamcinolone 15 (g) as directed Sween Lotion (Moisturizing lotion) Discharge Instruction: Apply moisturizing lotion as directed Topical Primary Dressing Maxorb Extra Ag+ Alginate Dressing, 4x4.75 (in/in) Discharge Instruction: Apply to wound bed as instructed Secondary Dressing ABD Pad, 8x10 Discharge Instruction: Apply over primary dressing as directed. Secured With Compression Wrap Urgo K2, (equivalent to a 4 layer) two layer compression system, regular Discharge Instruction: Apply Urgo K2 as directed (alternative to 4 layer compression). Compression Stockings Add-Ons Electronic Signature(s) Signed: 02/28/2023 3:27:24 PM By: Shawn Stall RN, BSN Entered By: Shawn Stall on 02/28/2023 09:16:27 Marvin Carr ([id number]) [id number] Page 13 of 14 -------------------------------------------------------------------------------- Wound Assessment Details Patient Name: Date of Service: Marvin Carr RD 02/28/2023 8:30 A M Medical Record Number: [id number] Patient Account Number: [id number] Date of Birth/Sex:  Treating RN: [birth year] (67 y.o. Tammy Sours Primary Care Wilmarie Sparlin: Nita Sells Other Clinician: Referring Linard Daft: Treating Kae Lauman/Extender: Maryan Puls in Treatment: 0 Wound Status Wound Number: 4 Primary Venous Leg Ulcer Etiology: Wound Location: Left, Lateral Lower Leg Secondary Lymphedema Wounding Event: Gradually Appeared Etiology: Date Acquired: 06/08/2022 Wound Open Weeks Of Treatment: 0 Status: Clustered Wound: No Comorbid Lymphedema, Sleep Apnea, Coronary Artery Disease, History: Hypertension, Neuropathy, Received Chemotherapy Photos Wound Measurements Length: (cm) 0.5 Width: (cm) 0.4 Depth: (cm) 0.1 Area: (cm) 0.157 Volume: (cm) 0.016 % Reduction in Area: % Reduction in Volume: Epithelialization: None Tunneling: No Undermining: No Wound Description Classification: Full Thickness Without Exposed Support Wound Margin: Distinct, outline attached Exudate Amount: Medium Exudate Type: Serosanguineous Exudate Color: red, brown Structures Foul Odor After Cleansing: No Slough/Fibrino No Wound Bed Granulation Amount: Large (67-100%) Exposed Structure Granulation Quality: Red Fascia Exposed: No Necrotic Amount: None Present (0%) Fat Layer (Subcutaneous Tissue) Exposed: Yes Tendon Exposed: No Muscle Exposed: No Joint Exposed: No Bone Exposed: No Periwound Skin Texture Texture Color No Abnormalities Noted: No No Abnormalities Noted: No Callus: No Atrophie Blanche: No Crepitus: No Cyanosis: No Excoriation: No Ecchymosis: No Induration: No Erythema: No Rash: No Hemosiderin Staining: Yes Scarring: No Mottled: No Pallor: No Moisture Rubor: No No Abnormalities Noted: No Dry / Scaly: No Marvin Carr, Marvin Carr ([id number]) [id number] Page 14 of 14 Maceration: No Treatment Notes Wound #4 (Lower Leg) Wound Laterality: Left, Lateral Cleanser Soap and Water Discharge Instruction: May shower and wash wound  with dial antibacterial soap and water prior to dressing change. Vashe 5.8 (oz) Discharge Instruction: Cleanse the wound with Vashe prior to applying a clean dressing using gauze sponges, not tissue or cotton balls. Peri-Wound Care Triamcinolone 15 (g) Discharge Instruction: Use  triamcinolone 15 (g) as directed Sween Lotion (Moisturizing lotion) Discharge Instruction: Apply moisturizing lotion as directed Topical Primary Dressing Maxorb Extra Ag+ Alginate Dressing, 4x4.75 (in/in) Discharge Instruction: Apply to wound bed as instructed Secondary Dressing ABD Pad, 8x10 Discharge Instruction: Apply over primary dressing as directed. Secured With Compression Wrap Urgo K2, (equivalent to a 4 layer) two layer compression system, regular Discharge Instruction: Apply Urgo K2 as directed (alternative to 4 layer compression). Compression Stockings Add-Ons Electronic Signature(s) Signed: 02/28/2023 3:27:24 PM By: Shawn Stall RN, BSN Entered By: Shawn Stall on 02/28/2023 09:17:10 -------------------------------------------------------------------------------- Vitals Details Patient Name: Date of Service: Marvin Carr RD 02/28/2023 8:30 A M Medical Record Number: [id number] Patient Account Number: [id number] Date of Birth/Sex: Treating RN: Dec 28, 1955 (67 y.o. Tammy Sours Primary Care Rudi Knippenberg: Nita Sells Other Clinician: Referring Kingston Guiles: Treating Littie Chiem/Extender: Maryan Puls in Treatment: 0 Vital Signs Time Taken: 08:52 Temperature (F): 98. Height (in): 72 Pulse (bpm): 82 Source: Stated Respiratory Rate (breaths/min): 18 Weight (lbs): 285 Blood Pressure (mmHg): 120/82 Source: Stated Reference Range: 80 - 120 mg / dl Body Mass Index (BMI): 38.6 Electronic Signature(s) Signed: 02/28/2023 3:27:24 PM By: Shawn Stall RN, BSN Entered By: Shawn Stall on 02/28/2023 08:57:21

## 2023-02-28 NOTE — Progress Notes (Signed)
CUONG, SOLOFF ([id number]) [id number] Page 1 of 4 Visit Report for 02/28/2023 Abuse Risk Screen Details Patient Name: Date of Service: Marvin Carr 02/28/2023 8:30 A M Medical Record Number: [id number] Patient Account Number: [id number] Date of Birth/Sex: Treating RN: 1956-05-05 (67 y.o. Tammy Sours Primary Care Seraphina Mitchner: Nita Sells Other Clinician: Referring Maya Arcand: Treating Alisha Bacus/Extender: Maryan Puls in Treatment: 0 Abuse Risk Screen Items Answer ABUSE RISK SCREEN: Has anyone close to you tried to hurt or harm you recentlyo No Do you feel uncomfortable with anyone in your familyo No Has anyone forced you do things that you didnt want to doo No Electronic Signature(s) Signed: 02/28/2023 3:27:24 PM By: Shawn Stall RN, BSN Entered By: Shawn Stall on 02/28/2023 08:51:01 -------------------------------------------------------------------------------- Activities of Daily Living Details Patient Name: Date of Service: Marvin Carr 02/28/2023 8:30 A M Medical Record Number: [id number] Patient Account Number: [id number] Date of Birth/Sex: Treating RN: 1956-03-17 (67 y.o. Tammy Sours Primary Care Nochum Fenter: Nita Sells Other Clinician: Referring Zayveon Raschke: Treating Naim Murtha/Extender: Maryan Puls in Treatment: 0 Activities of Daily Living Items Answer Activities of Daily Living (Please select one for each item) Drive Automobile Completely Able T Medications ake Completely Able Use T elephone Completely Able Care for Appearance Completely Able Use T oilet Completely Able Bath / Shower Completely Able Dress Self Completely Able Feed Self Completely Able Walk Completely Able Get In / Out Bed Completely Able Housework Completely Able Prepare Meals Completely Able Handle Money Completely Able Shop for Self Completely Able Electronic Signature(s) Signed: 02/28/2023 3:27:24 PM By:  Shawn Stall RN, BSN Entered By: Shawn Stall on 02/28/2023 08:51:15 Rufina Falco ([id number]) [phone] [id number] Page 2 of 4 -------------------------------------------------------------------------------- Education Screening Details Patient Name: Date of Service: Marvin Carr 02/28/2023 8:30 A M Medical Record Number: [id number] Patient Account Number: [id number] Date of Birth/Sex: Treating RN: [birth year] (67 y.o. Tammy Sours Primary Care Mizraim Harmening: Nita Sells Other Clinician: Referring Krystl Wickware: Treating Malayia Spizzirri/Extender: Maryan Puls in Treatment: 0 Primary Learner Assessed: Patient Learning Preferences/Education Level/Primary Language Learning Preference: Explanation, Demonstration, Printed Material Highest Education Level: College or Above Preferred Language: English Cognitive Barrier Language Barrier: No Translator Needed: No Memory Deficit: No Emotional Barrier: No Cultural/Religious Beliefs Affecting Medical Care: No Physical Barrier Impaired Vision: Yes Glasses Impaired Hearing: No Decreased Hand dexterity: No Knowledge/Comprehension Knowledge Level: High Comprehension Level: High Ability to understand written instructions: High Ability to understand verbal instructions: High Motivation Anxiety Level: Calm Cooperation: Cooperative Education Importance: Acknowledges Need Interest in Health Problems: Asks Questions Perception: Coherent Willingness to Engage in Self-Management High Activities: Readiness to Engage in Self-Management High Activities: Electronic Signature(s) Signed: 02/28/2023 3:27:24 PM By: Shawn Stall RN, BSN Entered By: Shawn Stall on 02/28/2023 08:51:35 -------------------------------------------------------------------------------- Fall Risk Assessment Details Patient Name: Date of Service: Marvin Carr 02/28/2023 8:30 A M Medical Record Number: [id number] Patient Account  Number: [id number] Date of Birth/Sex: Treating RN: 10-Aug-1956 (67 y.o. Tammy Sours Primary Care Morine Kohlman: Nita Sells Other Clinician: Referring Krew Hortman: Treating Miranda Garber/Extender: Maryan Puls in Treatment: 0 Fall Risk Assessment Items Have you had 2 or more falls in the last [address] monthso 0 No MALICK, TUSTIN ([id number]) [id number] Page 3 of 4 Have you had any fall that resulted in injury in the last 12 monthso 0 No FALLS RISK SCREEN History of falling - immediate or within 3 months 0 No Secondary diagnosis (Do you have 2 or more  medical diagnoseso) 0 No Ambulatory aid None/bed rest/wheelchair/nurse 0 Yes Crutches/cane/walker 0 No Furniture 0 No Intravenous therapy Access/Saline/Heparin Lock 0 No Gait/Transferring Normal/ bed rest/ wheelchair 0 Yes Weak (short steps with or without shuffle, stooped but able to lift head while walking, may seek 0 No support from furniture) Impaired (short steps with shuffle, may have difficulty arising from chair, head down, impaired 0 No balance) Mental Status Oriented to own ability 0 Yes Electronic Signature(s) Signed: 02/28/2023 3:27:24 PM By: Shawn Stall RN, BSN Entered By: Shawn Stall on 02/28/2023 08:52:48 -------------------------------------------------------------------------------- Foot Assessment Details Patient Name: Date of Service: Marvin Carr 02/28/2023 8:30 A M Medical Record Number: [id number] Patient Account Number: [id number] Date of Birth/Sex: Treating RN: [birth year] (66 y.o. Tammy Sours Primary Care Lovella Hardie: Nita Sells Other Clinician: Referring Unique Searfoss: Treating Kalmen Lollar/Extender: Maryan Puls in Treatment: 0 Foot Assessment Items Site Locations + = Sensation present, - = Sensation absent, C = Callus, U = Ulcer R = Redness, W = Warmth, M = Maceration, PU = Pre-ulcerative lesion F = Fissure, S = Swelling, D =  Dryness Assessment Right: Left: Other Deformity: No No Prior Foot Ulcer: No No Prior Amputation: No No Charcot Joint: No No Ambulatory Status: Ambulatory Without Help GaitRAMI, CHAPP ([id number]) [id number] Page 4 of 4 Electronic Signature(s) Signed: 02/28/2023 3:27:24 PM By: Shawn Stall RN, BSN Entered By: Shawn Stall on 02/28/2023 09:17:31 -------------------------------------------------------------------------------- Nutrition Risk Screening Details Patient Name: Date of Service: Marvin Carr 02/28/2023 8:30 A M Medical Record Number: [id number] Patient Account Number: [id number] Date of Birth/Sex: Treating RN: 1956-03-01 (67 y.o. Tammy Sours Primary Care Franceska Strahm: Nita Sells Other Clinician: Referring Marisha Renier: Treating Klynn Linnemann/Extender: Maryan Puls in Treatment: 0 Height (in): Weight (lbs): Body Mass Index (BMI): Nutrition Risk Screening Items Score Screening NUTRITION RISK SCREEN: I have an illness or condition that made me change the kind and/or amount of food I eat 2 Yes I eat fewer than two meals per day 0 No I eat few fruits and vegetables, or milk products 0 No I have three or more drinks of beer, liquor or wine almost every day 0 No I have tooth or mouth problems that make it hard for me to eat 0 No I don't always have enough money to buy the food I need 0 No I eat alone most of the time 0 No I take three or more different prescribed or over-the-counter drugs a day 1 Yes Without wanting to, I have lost or gained 10 pounds in the last six months 0 No I am not always physically able to shop, cook and/or feed myself 0 No Nutrition Protocols Good Risk Protocol Moderate Risk Protocol 0 Provide education on nutrition High Risk Proctocol Risk Level: Moderate Risk Score: 3 Electronic Signature(s) Signed: 02/28/2023 3:27:24 PM By: Shawn Stall RN, BSN Entered By: Shawn Stall on  02/28/2023 08:51:51

## 2023-03-01 ENCOUNTER — Inpatient Hospital Stay: Payer: Medicare Other

## 2023-03-01 ENCOUNTER — Inpatient Hospital Stay (HOSPITAL_BASED_OUTPATIENT_CLINIC_OR_DEPARTMENT_OTHER): Payer: Medicare Other | Admitting: Hematology

## 2023-03-01 ENCOUNTER — Inpatient Hospital Stay: Payer: Medicare Other | Attending: Hematology

## 2023-03-01 ENCOUNTER — Encounter: Payer: Self-pay | Admitting: Hematology

## 2023-03-01 VITALS — BP 133/78 | HR 72 | Temp 97.4°F | Resp 20

## 2023-03-01 DIAGNOSIS — G479 Sleep disorder, unspecified: Secondary | ICD-10-CM

## 2023-03-01 DIAGNOSIS — Z79899 Other long term (current) drug therapy: Secondary | ICD-10-CM | POA: Insufficient documentation

## 2023-03-01 DIAGNOSIS — Z87891 Personal history of nicotine dependence: Secondary | ICD-10-CM | POA: Diagnosis not present

## 2023-03-01 DIAGNOSIS — Z8551 Personal history of malignant neoplasm of bladder: Secondary | ICD-10-CM | POA: Insufficient documentation

## 2023-03-01 DIAGNOSIS — E876 Hypokalemia: Secondary | ICD-10-CM | POA: Diagnosis not present

## 2023-03-01 DIAGNOSIS — Z95828 Presence of other vascular implants and grafts: Secondary | ICD-10-CM

## 2023-03-01 DIAGNOSIS — G629 Polyneuropathy, unspecified: Secondary | ICD-10-CM

## 2023-03-01 DIAGNOSIS — R609 Edema, unspecified: Secondary | ICD-10-CM | POA: Diagnosis not present

## 2023-03-01 DIAGNOSIS — C221 Intrahepatic bile duct carcinoma: Secondary | ICD-10-CM | POA: Diagnosis present

## 2023-03-01 DIAGNOSIS — Z832 Family history of diseases of the blood and blood-forming organs and certain disorders involving the immune mechanism: Secondary | ICD-10-CM | POA: Diagnosis not present

## 2023-03-01 DIAGNOSIS — Z5112 Encounter for antineoplastic immunotherapy: Secondary | ICD-10-CM | POA: Diagnosis present

## 2023-03-01 DIAGNOSIS — C786 Secondary malignant neoplasm of retroperitoneum and peritoneum: Secondary | ICD-10-CM | POA: Diagnosis not present

## 2023-03-01 DIAGNOSIS — Z79620 Long term (current) use of immunosuppressive biologic: Secondary | ICD-10-CM | POA: Diagnosis not present

## 2023-03-01 DIAGNOSIS — Z7982 Long term (current) use of aspirin: Secondary | ICD-10-CM | POA: Insufficient documentation

## 2023-03-01 DIAGNOSIS — C9000 Multiple myeloma not having achieved remission: Secondary | ICD-10-CM

## 2023-03-01 DIAGNOSIS — Z8042 Family history of malignant neoplasm of prostate: Secondary | ICD-10-CM | POA: Diagnosis not present

## 2023-03-01 LAB — COMPREHENSIVE METABOLIC PANEL
ALT: 28 U/L (ref 0–44)
AST: 30 U/L (ref 15–41)
Albumin: 3.3 g/dL — ABNORMAL LOW (ref 3.5–5.0)
Alkaline Phosphatase: 90 U/L (ref 38–126)
Anion gap: 9 (ref 5–15)
BUN: 44 mg/dL — ABNORMAL HIGH (ref 8–23)
CO2: 27 mmol/L (ref 22–32)
Calcium: 9.2 mg/dL (ref 8.9–10.3)
Chloride: 100 mmol/L (ref 98–111)
Creatinine, Ser: 1.23 mg/dL (ref 0.61–1.24)
GFR, Estimated: >60 mL/min (ref >60)
Glucose, Bld: 221 mg/dL — ABNORMAL HIGH (ref 70–99)
Potassium: 3.9 mmol/L (ref 3.5–5.1)
Sodium: 136 mmol/L (ref 135–145)
Total Bilirubin: 0.5 mg/dL (ref 0.3–1.2)
Total Protein: 6.5 g/dL (ref 6.5–8.1)

## 2023-03-01 LAB — TSH: TSH: 2.653 u[IU]/mL (ref 0.350–4.500)

## 2023-03-01 LAB — CBC WITH DIFFERENTIAL/PLATELET
Abs Immature Granulocytes: 0.06 10*3/uL (ref 0.00–0.07)
Basophils Absolute: 0.1 10*3/uL (ref 0.0–0.1)
Basophils Relative: 1 %
Eosinophils Absolute: 0.3 10*3/uL (ref 0.0–0.5)
Eosinophils Relative: 6 %
HCT: 35.2 % — ABNORMAL LOW (ref 39.0–52.0)
Hemoglobin: 11.6 g/dL — ABNORMAL LOW (ref 13.0–17.0)
Immature Granulocytes: 1 %
Lymphocytes Relative: 18 %
Lymphs Abs: 0.9 10*3/uL (ref 0.7–4.0)
MCH: 30.1 pg (ref 26.0–34.0)
MCHC: 33 g/dL (ref 30.0–36.0)
MCV: 91.2 fL (ref 80.0–100.0)
Monocytes Absolute: 0.7 10*3/uL (ref 0.1–1.0)
Monocytes Relative: 13 %
Neutro Abs: 3.1 10*3/uL (ref 1.7–7.7)
Neutrophils Relative %: 61 %
Platelets: 283 10*3/uL (ref 150–400)
RBC: 3.86 MIL/uL — ABNORMAL LOW (ref 4.22–5.81)
RDW: 13.6 % (ref 11.5–15.5)
WBC: 5.1 10*3/uL (ref 4.0–10.5)
nRBC: 0 % (ref 0.0–0.2)

## 2023-03-01 LAB — MAGNESIUM: Magnesium: 1.8 mg/dL (ref 1.7–2.4)

## 2023-03-01 MED ORDER — HEPARIN SOD (PORK) LOCK FLUSH 100 UNIT/ML IV SOLN
500.0000 [IU] | Freq: Once | INTRAVENOUS | Status: AC | PRN
  Administered 2023-03-01: 500 [IU]

## 2023-03-01 MED ORDER — SODIUM CHLORIDE 0.9% FLUSH
10.0000 mL | Freq: Once | INTRAVENOUS | Status: AC
  Administered 2023-03-01: 10 mL via INTRAVENOUS

## 2023-03-01 MED ORDER — SODIUM CHLORIDE 0.9 % IV SOLN
1500.0000 mg | Freq: Once | INTRAVENOUS | Status: AC
  Administered 2023-03-01: 1500 mg via INTRAVENOUS
  Filled 2023-03-01: qty 30

## 2023-03-01 MED ORDER — SODIUM CHLORIDE 0.9 % IV SOLN: Freq: Once | INTRAVENOUS | Status: AC

## 2023-03-01 MED ORDER — SODIUM CHLORIDE 0.9% FLUSH
10.0000 mL | INTRAVENOUS | Status: DC | PRN
  Administered 2023-03-01: 10 mL

## 2023-03-01 NOTE — Progress Notes (Signed)
Patients port flushed without difficulty.  Good blood return noted with no bruising or swelling noted at site.  Stable during access and blood draw.  Patient to remain accessed for treatment. 

## 2023-03-01 NOTE — Patient Instructions (Addendum)
[facility] at [facility] Discharge Instructions   You were seen and examined today by Dr. Ellin Saba.  He reviewed the results of your lab work which are normal/stable.   We will proceed with your treatment today.   We will repeat a scan prior to your next visit.   Return as scheduled.    Thank you for choosing [facility] at [facility] to provide your oncology and hematology care.  To afford each patient quality time with our provider, please arrive at least 15 minutes before your scheduled appointment time.   If you have a lab appointment with the Cancer Center please come in thru the Main Entrance and check in at the main information desk.  You need to re-schedule your appointment should you arrive 10 or more minutes late.  We strive to give you quality time with our providers, and arriving late affects you and other patients whose appointments are after yours.  Also, if you no show three or more times for appointments you may be dismissed from the clinic at the providers discretion.     Again, thank you for choosing [facility].  Our hope is that these requests will decrease the amount of time that you wait before being seen by our physicians.       _____________________________________________________________  Should you have questions after your visit to [facility], please contact our office at [phone] and follow the prompts.  Our office hours are 8:00 a.m. and 4:30 p.m. Monday - Friday.  Please note that voicemails left after 4:00 p.m. may not be returned until the following business day.  We are closed weekends and major holidays.  You do have access to a nurse 24-7, just call the main number to the clinic [phone] and do not press any options, hold on the line and a nurse will answer the phone.    For prescription refill requests, have your pharmacy contact our office and allow 72 hours.    Due to  Covid, you will need to wear a mask upon entering the hospital. If you do not have a mask, a mask will be given to you at the Main Entrance upon arrival. For doctor visits, patients may have 1 support person age 70 or older with them. For treatment visits, patients can not have anyone with them due to social distancing guidelines and our immunocompromised population.

## 2023-03-01 NOTE — Progress Notes (Signed)
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 Patient is in satisfactory condition with no new complaints voiced.  Vital signs are stable.  Labs reviewed by Dr. Ellin Saba during the office visit and all labs are within treatment parameters.  We will proceed with treatment per MD orders.   Patient tolerated treatment well with no complaints voiced.  Patient left ambulatory in stable condition.  Vital signs stable at discharge.  Follow up as scheduled.

## 2023-03-01 NOTE — Progress Notes (Signed)
[facility] [address] [place] [address]    Clinic Day:  03/01/2023  Referring physician: Benita Stabile, MD  Patient Care Team: Benita Stabile, MD as PCP - General (Internal Medicine) Jonelle Sidle, MD as Consulting Physician (Cardiology) Doreatha Massed, MD as Medical Oncologist (Medical Oncology)   ASSESSMENT & PLAN:   Assessment: 1. Liver lesion/peritoneal carcinomatosis: - Patient seen at the request of Dr. [name] - Reported pressure on the sides of the abdomen with slight pain for the last 1 month.  He also reported pain in the epigastric region. - He had lost 20 pounds in the last 3 to 4 months intentionally, cutting back on sugars.  He was also started on semaglutide. - Colonoscopy on 06/28/2011 with a benign polypoid colonic mucosa in the descending colon.  No evidence of malignancy. - MRI of the brain was negative. - EGD and colonoscopy on 09/23/2018 did not reveal any malignancies. 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  IHC positive for CK7, CDX2 and negative for GATA3.  Findings suggestive of upper GI or pancreaticobiliary primary. - Cycle 1 of gemcitabine, cisplatin and durvalumab started on 10/05/2021. - NGS: No targetable mutations.  PD-L1 (UE454) negative.  MSI-stable.  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   2. Social/family history: - He lives at home by himself.  He records voiceover/narrations. - Non-smoker. - Father died of MDS.  Paternal grandfather had prostate cancer.  Maternal grandfather had cancer.  3.  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 Laverle Patter.  Reportedly received 1 treatment of intravesical chemo and has been on surveillance since then.  Last surveillance visit was in 2020.    Plan: 1. Cholangiocarcinoma with peritoneal carcinomatosis: - He has tolerated last dose of durvalumab very well. - Labs today: Normal LFTs and creatinine.   CBC grossly normal.  TSH is 2.6. - He was evaluated by wound care clinic yesterday at [facility] for his leg wounds.  Doxycycline to be started. - He may proceed with treatment today.  RTC 4 weeks for follow-up with repeat CT CAP.   2. Fluid retention: - Continue Bumex 2 mg daily as needed along with metolazone 5 mg 3 times weekly.  3.  Neuropathy/"drawing" of legs: - Continue Mirapex 0.75 mg 1 tablet in the mid afternoon and 1 at bedtime. - Continue gabapentin 300 mg 4 times daily.  4.  Sleeping difficulty: - Continue Lunesta as needed.  5.  Hypomagnesemia/hypokalemia: - Continue K-Dur 20 milliequivalents daily and magnesium 3 times daily.  Both are normal.    Orders Placed This Encounter  Procedures   CT CHEST ABDOMEN PELVIS W CONTRAST    Standing Status:   Future    Standing Expiration Date:   02/29/2024    Order Specific Question:   If indicated for the ordered procedure, I authorize the administration of contrast media per Radiology protocol    Answer:   Yes    Order Specific Question:   Does the patient have a contrast media/X-ray dye allergy?    Answer:   No    Order Specific Question:   Preferred imaging location?    Answer:   [facility]    Order Specific Question:   If indicated for the ordered procedure, I authorize the administration of oral contrast media per Radiology protocol    Answer:   Yes      I,Katie Daubenspeck,acting as a scribe for [employer],  MD.,have documented all relevant documentation on the behalf of Doreatha Massed, MD,as directed by  Doreatha Massed, MD while in the presence of Doreatha Massed, MD.   I, Doreatha Massed MD, have reviewed the above documentation for accuracy and completeness, and I agree with the above.   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 male presenting to clinic today for follow up of cholangiocarcinoma and peritoneal carcinomatosis. He was last seen by me on 02/01/23.  Today, he states that he is doing well overall. His appetite level is at 75%. His energy level is at 40%.  PAST MEDICAL HISTORY:   Past Medical History: Past Medical History:  Diagnosis Date   Anxiety    Bladder cancer (HCC) 09/11/2009   CAD (coronary artery disease), native coronary artery    a. Mildly elevated troponin 03/2013, cath with nonobstructive disease including 50% AV groove distal stenosis before large OM   Essential hypertension    Headache(784.0)    History of migraines   Hyperglycemia    Mixed hyperlipidemia    Neuropathy    Obesity    Port-A-Cath in place 09/30/2021   Pre-diabetes    Seasonal allergies    Sleep apnea    On CPAP    Surgical History: Past Surgical History:  Procedure Laterality Date   BIOPSY  09/23/2021   Procedure: BIOPSY;  Surgeon: Dolores Frame, MD;  [facility];  Service: Gastroenterology;;   COLONOSCOPY  06/28/2011   Procedure: COLONOSCOPY;  Surgeon: Malissa Hippo, MD;  [facility]: [facility];  Service: Endoscopy;  Laterality: N/A;   COLONOSCOPY WITH PROPOFOL N/A 09/23/2021   Procedure:  COLONOSCOPY WITH PROPOFOL;  Surgeon: Dolores Frame, MD;  Location: [facility];  Service: Gastroenterology;  Laterality: N/A;  940   ESOPHAGOGASTRODUODENOSCOPY (EGD) WITH PROPOFOL N/A 09/23/2021   Procedure: ESOPHAGOGASTRODUODENOSCOPY (EGD) WITH PROPOFOL;  Surgeon: Dolores Frame, MD;  Location: [facility];  Service: Gastroenterology;  Laterality: N/A;   IR IMAGING GUIDED PORT INSERTION  09/28/2021   IR PARACENTESIS  09/28/2021   JOINT REPLACEMENT Right    hip   LEFT HEART CATHETERIZATION WITH CORONARY ANGIOGRAM N/A 03/31/2013   Procedure: LEFT HEART CATHETERIZATION WITH CORONARY ANGIOGRAM;  Surgeon: [name], MD;  Location: [facility] CATH LAB;  Service: Cardiovascular;  Laterality: N/A;   POLYPECTOMY  09/23/2021   Procedure: POLYPECTOMY;  Surgeon: Dolores Frame, MD;  Location: [facility];  Service: Gastroenterology;;   TURBT  09/11/2009    Social History: Social History   Socioeconomic History   Marital status: Divorced    Spouse name: Not on file   Number of children: 0  Years of education: college   Highest education level: Not on file  Occupational History    Employer: SELF-EMPLOYED  Tobacco Use   Smoking status: Former    Packs/day: 0.50    Years: 10.00    Additional pack years: 0.00    Total pack years: 5.00    Types: Cigarettes    Quit date: 07/10/2011    Years since quitting: 11.6   Smokeless tobacco: Never   Tobacco comments:    Quit several yrs prior to 03/2013.  Vaping Use   Vaping Use: Never used  Substance and Sexual Activity   Alcohol use: Yes    Alcohol/week: 0.0 standard drinks of alcohol    Comment: Occasional   Drug use: No   Sexual activity: Not on file  Other Topics Concern   Not on file  Social History Narrative   Not on file   Social Determinants of Health   Financial Resource Strain: Not on file  Food Insecurity: No Food Insecurity (06/05/2022)   Hunger Vital Sign    Worried About Running Out of Food in  the Last Year: Never true    Ran Out of Food in the Last Year: Never true  Transportation Needs: No Transportation Needs (06/05/2022)   PRAPARE - Administrator, Civil Service (Medical): No    Lack of Transportation (Non-Medical): No  Physical Activity: Not on file  Stress: Not on file  Social Connections: Not on file  Intimate Partner Violence: Not At Risk (06/05/2022)   Humiliation, Afraid, Rape, and Kick questionnaire    Fear of Current or Ex-Partner: No    Emotionally Abused: No    Physically Abused: No    Sexually Abused: No    Family History: Family History  Problem Relation Age of Onset   COPD Father     Current Medications:  Current Outpatient Medications:    amLODipine (NORVASC) 5 MG tablet, Take 5 mg by mouth daily., Disp: , Rfl:    aspirin EC 81 MG tablet, Take 81 mg by mouth daily., Disp: , Rfl:    bumetanide (BUMEX) 2 MG tablet, Take 1 tablet (2 mg total) by mouth in the morning., Disp: 30 tablet, Rfl: 2   buPROPion (WELLBUTRIN XL) 300 MG 24 hr tablet, Take 300 mg by mouth daily., Disp: , Rfl:    cyclobenzaprine (FLEXERIL) 10 MG tablet, Take 10 mg by mouth at bedtime as needed for muscle spasms., Disp: , Rfl:    doxycycline (VIBRAMYCIN) 100 MG capsule, Take 100 mg by mouth 2 (two) times daily., Disp: , Rfl:    Durvalumab (IMFINZI IV), Inject into the vein every 21 ( twenty-one) days., Disp: , Rfl:    Elastic Bandages & Supports (ELASTIC BANDAGE 6") MISC, Use compression wraps during the day for leg swelling, Disp: 4 each, Rfl: 0   Eszopiclone 3 MG TABS, Take 3 mg by mouth at bedtime. Take immediately before bedtime, Disp: , Rfl:    fenofibrate 160 MG tablet, Take 160 mg by mouth daily., Disp: , Rfl:    FLUoxetine (PROZAC) 10 MG capsule, Take 10 mg by mouth daily., Disp: , Rfl:    gabapentin (NEURONTIN) 300 MG capsule, Take 2 capsules (600 mg total) by mouth 4 (four) times daily., Disp: 240 capsule, Rfl: 11   Gauze Pads & Dressings (ABDOMINAL PAD) 8"X10"  PADS, Use for leaking fluid from leg swelling, Disp: 360 each, Rfl: 0   magnesium oxide (MAG-OX) 400 (240 Mg) MG tablet, Take 1 tablet (400 mg  total) by mouth 2 (two) times daily. (Patient taking differently: Take 400 mg by mouth 3 (three) times daily.), Disp: 90 tablet, Rfl: 6   metFORMIN (GLUCOPHAGE-XR) 500 MG 24 hr tablet, Take 500 mg by mouth 2 (two) times daily., Disp: , Rfl:    metolazone (ZAROXOLYN) 5 MG tablet, Take 1 tablet (5 mg total) by mouth 3 (three) times a week., Disp: 30 tablet, Rfl: 2   nitroGLYCERIN (NITROSTAT) 0.4 MG SL tablet, Place 1 tablet (0.4 mg total) under the tongue every 5 (five) minutes as needed for chest pain., Disp: 25 tablet, Rfl: 3   olmesartan (BENICAR) 40 MG tablet, TAKE (1) TABLET BY MOUTH ONCE DAILY., Disp: 30 tablet, Rfl: 0   potassium chloride SA (KLOR-CON M) 20 MEQ tablet, Take 1 tablet (20 mEq total) by mouth daily., Disp: 60 tablet, Rfl: 3   pramipexole (MIRAPEX) 0.75 MG tablet, Take 1 tablet (0.75 mg total) by mouth 3 (three) times daily., Disp: 90 tablet, Rfl: 0   RYBELSUS 7 MG TABS, Take 14 mg by mouth daily., Disp: , Rfl:    tamsulosin (FLOMAX) 0.4 MG CAPS capsule, Take 0.4 mg by mouth daily., Disp: , Rfl:    traMADol (ULTRAM) 50 MG tablet, Take 50 mg by mouth 4 (four) times daily as needed for moderate pain., Disp: , Rfl:    pantoprazole (PROTONIX) 40 MG tablet, Take 1 tablet (40 mg total) by mouth daily., Disp: 90 tablet, Rfl: 0   zaleplon (SONATA) 10 MG capsule, Take 1 capsule (10 mg total) by mouth at bedtime as needed for sleep., Disp: 30 capsule, Rfl: 5 No current facility-administered medications for this visit.  Facility-Administered Medications Ordered in Other Visits:    magnesium sulfate 2 GM/50ML IVPB, , , ,    sodium chloride flush (NS) 0.9 % injection 10 mL, 10 mL, Intracatheter, PRN, Doreatha Massed, MD, 10 mL at 03/01/23 1405   Allergies: No Known Allergies  REVIEW OF SYSTEMS:   Review of Systems  Constitutional:  Negative  for chills, fatigue and fever.  HENT:   Negative for lump/mass, mouth sores, nosebleeds, sore throat and trouble swallowing.   Eyes:  Negative for eye problems.  Respiratory:  Positive for shortness of breath. Negative for cough.   Cardiovascular:  Negative for chest pain, leg swelling and palpitations.  Gastrointestinal:  Negative for abdominal pain, constipation, diarrhea, nausea and vomiting.  Genitourinary:  Negative for bladder incontinence, difficulty urinating, dysuria, frequency, hematuria and nocturia.   Musculoskeletal:  Negative for arthralgias, back pain, flank pain, myalgias and neck pain.  Skin:  Negative for itching and rash.  Neurological:  Positive for dizziness and numbness. Negative for headaches.  Hematological:  Does not bruise/bleed easily.  Psychiatric/Behavioral:  Positive for sleep disturbance. Negative for depression and suicidal ideas. The patient is not nervous/anxious.   All other systems reviewed and are negative.    VITALS:   There were no vitals taken for this visit.  Wt Readings from Last 3 Encounters:  03/01/23 287 lb 6.4 oz (130.4 kg)  02/22/23 286 lb (129.7 kg)  02/01/23 281 lb 3.2 oz (127.6 kg)    There is no height or weight on file to calculate BMI.  Performance status (ECOG): 1 - Symptomatic but completely ambulatory  PHYSICAL EXAM:   Physical Exam Vitals and nursing note reviewed. Exam conducted with a chaperone present.  Constitutional:      Appearance: Normal appearance.  Cardiovascular:     Rate and Rhythm: Normal rate and regular rhythm.  Pulses: Normal pulses.     Heart sounds: Normal heart sounds.  Pulmonary:     Effort: Pulmonary effort is normal.     Breath sounds: Normal breath sounds.  Abdominal:     Palpations: Abdomen is soft. There is no hepatomegaly, splenomegaly or mass.     Tenderness: There is no abdominal tenderness.  Musculoskeletal:     Right lower leg: No edema.     Left lower leg: No edema.   Lymphadenopathy:     Cervical: No cervical adenopathy.     Right cervical: No superficial, deep or posterior cervical adenopathy.    Left cervical: No superficial, deep or posterior cervical adenopathy.     Upper Body:     Right upper body: No supraclavicular or axillary adenopathy.     Left upper body: No supraclavicular or axillary adenopathy.  Neurological:     General: No focal deficit present.     Mental Status: He is alert and oriented to person, place, and time.  Psychiatric:        Mood and Affect: Mood normal.        Behavior: Behavior normal.     LABS:      Latest Ref Rng & Units 03/01/2023   10:10 AM 02/01/2023   11:47 AM 01/04/2023   10:23 AM  CBC  WBC 4.0 - 10.5 K/uL 5.1  6.8  7.3   Hemoglobin 13.0 - 17.0 g/dL 81.1  91.4  78.2   Hematocrit 39.0 - 52.0 % 35.2  35.8  39.0   Platelets 150 - 400 K/uL 283  268  308       Latest Ref Rng & Units 03/01/2023   10:10 AM 02/01/2023   11:47 AM 01/04/2023   10:23 AM  CMP  Glucose 70 - 99 mg/dL 956  213  086   BUN 8 - 23 mg/dL 44  55  27   Creatinine 0.61 - 1.24 mg/dL 5.78  4.69  6.29   Sodium 135 - 145 mmol/L 136  134  135   Potassium 3.5 - 5.1 mmol/L 3.9  3.2  3.4   Chloride 98 - 111 mmol/L 100  92  98   CO2 22 - 32 mmol/L 27  28  28    Calcium 8.9 - 10.3 mg/dL 9.2  8.9  9.6   Total Protein 6.5 - 8.1 g/dL 6.5  6.5  6.8   Total Bilirubin 0.3 - 1.2 mg/dL 0.5  0.5  0.2   Alkaline Phos 38 - 126 U/L 90  86  65   AST 15 - 41 U/L 30  28  28    ALT 0 - 44 U/L 28  23  35      Lab Results  Component Value Date   CEA1 2.8 10/31/2022   /  CEA  Date Value Ref Range Status  10/31/2022 2.8 0.0 - 4.7 ng/mL Final    Comment:    (NOTE)                             Nonsmokers          <3.9                             Smokers             <5.6 [employer] Electrochemiluminescence Immunoassay (ECLIA) Values obtained with different assay methods or kits cannot be  used interchangeably.  Results cannot be interpreted as  absolute evidence of the presence or absence of malignant disease. Performed At: [facility] [address] [place], [place] [id number] Jolene Schimke MD WU:[id number]    No results found for: "PSA1" Lab Results  Component Value Date   NFA213 11 10/31/2022   No results found for: "CAN125"  No results found for: "TOTALPROTELP", "ALBUMINELP", "A1GS", "A2GS", "BETS", "BETA2SER", "GAMS", "MSPIKE", "SPEI" Lab Results  Component Value Date   TIBC 428 01/29/2023   TIBC 457 (H) 01/04/2022   FERRITIN 183 01/29/2023   FERRITIN 522 (H) 01/04/2022   IRONPCTSAT 15 01/29/2023   IRONPCTSAT 16 (L) 01/04/2022   Lab Results  Component Value Date   LDH 266 (H) 10/05/2021   LDH 242 (H) 09/01/2021     STUDIES:   No results found.

## 2023-03-01 NOTE — Patient Instructions (Signed)
[facility] [place]  Discharge Instructions: Thank you for choosing [facility] to provide your oncology and hematology care.  If you have a lab appointment with the Cancer Center - please note that after April 8th, 2024, all labs will be drawn in the cancer center.  You do not have to check in or register with the main entrance as you have in the past but will complete your check-in in the cancer center.  Wear comfortable clothing and clothing appropriate for easy access to any Portacath or PICC line.   We strive to give you quality time with your provider. You may need to reschedule your appointment if you arrive late (15 or more minutes).  Arriving late affects you and other patients whose appointments are after yours.  Also, if you miss three or more appointments without notifying the office, you may be dismissed from the clinic at the provider's discretion.      For prescription refill requests, have your pharmacy contact our office and allow 72 hours for refills to be completed.    Today you received the following chemotherapy and/or immunotherapy agents Imfinzi.  Durvalumab Injection What is this medication? DURVALUMAB (dur VAL ue mab) treats some types of cancer. It works by helping your immune system slow or stop the spread of cancer cells. It is a monoclonal antibody. This medicine may be used for other purposes; ask your health care provider or pharmacist if you have questions. COMMON BRAND NAME(S): IMFINZI What should I tell my care team before I take this medication? They need to know if you have any of these conditions: Allogeneic stem cell transplant (uses someone else's stem cells) Autoimmune diseases, such as Crohn disease, ulcerative colitis, lupus History of chest radiation Nervous system problems, such as Guillain-Barre syndrome, myasthenia gravis Organ transplant An unusual or allergic reaction to durvalumab, other medications, foods, dyes, or  preservatives Pregnant or trying to get pregnant Breast-feeding How should I use this medication? This medication is infused into a vein. It is given by your care team in a hospital or clinic setting. A special MedGuide will be given to you before each treatment. Be sure to read this information carefully each time. Talk to your care team about the use of this medication in children. Special care may be needed. Overdosage: If you think you have taken too much of this medicine contact a poison control center or emergency room at once. NOTE: This medicine is only for you. Do not share this medicine with others. What if I miss a dose? Keep appointments for follow-up doses. It is important not to miss your dose. Call your care team if you are unable to keep an appointment. What may interact with this medication? Interactions have not been studied. This list may not describe all possible interactions. Give your health care provider a list of all the medicines, herbs, non-prescription drugs, or dietary supplements you use. Also tell them if you smoke, drink alcohol, or use illegal drugs. Some items may interact with your medicine. What should I watch for while using this medication? Your condition will be monitored carefully while you are receiving this medication. You may need blood work while taking this medication. This medication may cause serious skin reactions. They can happen weeks to months after starting the medication. Contact your care team right away if you notice fevers or flu-like symptoms with a rash. The rash may be red or purple and then turn into blisters or peeling of   the skin. You may also notice a red rash with swelling of the face, lips, or lymph nodes in your neck or under your arms. Tell your care team right away if you have any change in your eyesight. Talk to your care team if you may be pregnant. Serious birth defects can occur if you take this medication during pregnancy and  for 3 months after the last dose. You will need a negative pregnancy test before starting this medication. Contraception is recommended while taking this medication and for 3 months after the last dose. Your care team can help you find the option that works for you. Do not breastfeed while taking this medication and for 3 months after the last dose. What side effects may I notice from receiving this medication? Side effects that you should report to your care team as soon as possible: Allergic reactions--skin rash, itching, hives, swelling of the face, lips, tongue, or throat Dry cough, shortness of breath or trouble breathing Eye pain, redness, irritation, or discharge with blurry or decreased vision Heart muscle inflammation--unusual weakness or fatigue, shortness of breath, chest pain, fast or irregular heartbeat, dizziness, swelling of the ankles, feet, or hands Hormone gland problems--headache, sensitivity to light, unusual weakness or fatigue, dizziness, fast or irregular heartbeat, increased sensitivity to cold or heat, excessive sweating, constipation, hair loss, increased thirst or amount of urine, tremors or shaking, irritability Infusion reactions--chest pain, shortness of breath or trouble breathing, feeling faint or lightheaded Kidney injury (glomerulonephritis)--decrease in the amount of urine, red or dark brown urine, foamy or bubbly urine, swelling of the ankles, hands, or feet Liver injury--right upper belly pain, loss of appetite, nausea, light-colored stool, dark yellow or brown urine, yellowing skin or eyes, unusual weakness or fatigue Pain, tingling, or numbness in the hands or feet, muscle weakness, change in vision, confusion or trouble speaking, loss of balance or coordination, trouble walking, seizures Rash, fever, and swollen lymph nodes Redness, blistering, peeling, or loosening of the skin, including inside the mouth Sudden or severe stomach pain, bloody diarrhea, fever,  nausea, vomiting Side effects that usually do not require medical attention (report these to your care team if they continue or are bothersome): Bone, joint, or muscle pain Diarrhea Fatigue Loss of appetite Nausea Skin rash This list may not describe all possible side effects. Call your doctor for medical advice about side effects. You may report side effects to FDA at 1-800-FDA-1088. Where should I keep my medication? This medication is given in a hospital or clinic. It will not be stored at home. NOTE: This sheet is a summary. It may not cover all possible information. If you have questions about this medicine, talk to your doctor, pharmacist, or health care provider.  2024 Elsevier/Gold Standard (2022-01-10 00:00:00)       To help prevent nausea and vomiting after your treatment, we encourage you to take your nausea medication as directed.  BELOW ARE SYMPTOMS THAT SHOULD BE REPORTED IMMEDIATELY: *FEVER GREATER THAN 100.4 F (38 C) OR HIGHER *CHILLS OR SWEATING *NAUSEA AND VOMITING THAT IS NOT CONTROLLED WITH YOUR NAUSEA MEDICATION *UNUSUAL SHORTNESS OF BREATH *UNUSUAL BRUISING OR BLEEDING *URINARY PROBLEMS (pain or burning when urinating, or frequent urination) *BOWEL PROBLEMS (unusual diarrhea, constipation, pain near the anus) TENDERNESS IN MOUTH AND THROAT WITH OR WITHOUT PRESENCE OF ULCERS (sore throat, sores in mouth, or a toothache) UNUSUAL RASH, SWELLING OR PAIN  UNUSUAL VAGINAL DISCHARGE OR ITCHING   Items with * indicate a potential emergency and should be followed   up as soon as possible or go to the Emergency Department if any problems should occur.  Please show the CHEMOTHERAPY ALERT CARD or IMMUNOTHERAPY ALERT CARD at check-in to the Emergency Department and triage nurse.  Should you have questions after your visit or need to cancel or reschedule your appointment, please contact [facility] [phone]  and follow the prompts.  Office hours are  8:00 a.m. to 4:30 p.m. Monday - Friday. Please note that voicemails left after 4:00 p.m. may not be returned until the following business day.  We are closed weekends and major holidays. You have access to a nurse at all times for urgent questions. Please call the main number to the clinic [phone] and follow the prompts.  For any non-urgent questions, you may also contact your provider using MyChart. We now offer e-Visits for anyone 18 and older to request care online for non-urgent symptoms. For details visit [email or web address].   Also download the MyChart app! Go to the app store, search "MyChart", open the app, select [facility], and log in with your MyChart username and password.   

## 2023-03-02 NOTE — Progress Notes (Signed)
Marvin Carr, Marvin Carr ([id number]) [id number] Page 1 of 5 Visit Report for 02/28/2023 Chief Complaint Document Details Patient Name: Date of Service: Marvin Carr 02/28/2023 8:30 A M Medical Record Number: [id number] Patient Account Number: [id number] Date of Birth/Sex: Treating RN: [birth year] (67 y.o. M) Primary Care Provider: Nita Sells Other Clinician: Referring Provider: Treating Provider/Extender: Maryan Puls in Treatment: 0 Information Obtained from: Patient Chief Complaint Bilateral LE Ulcers Electronic Signature(s) Signed: 02/28/2023 9:57:25 AM By: Allen Derry PA-C Entered By: Allen Derry on 02/28/2023 09:57:25 -------------------------------------------------------------------------------- Physician Orders Details Patient Name: Date of Service: Marvin Carr 02/28/2023 8:30 A M Medical Record Number: [id number] Patient Account Number: [id number] Date of Birth/Sex: Treating RN: [birth year] (67 y.o. Marvin Carr Primary Care Provider: Nita Sells Other Clinician: Referring Provider: Treating Provider/Extender: Maryan Puls in Treatment: 0 Verbal / Phone Orders: No Diagnosis Coding ICD-10 Coding Code Description I89.0 Lymphedema, not elsewhere classified I87.333 Chronic venous hypertension (idiopathic) with ulcer and inflammation of bilateral lower extremity L97.822 Non-pressure chronic ulcer of other part of left lower leg with fat layer exposed L97.812 Non-pressure chronic ulcer of other part of right lower leg with fat layer exposed I10 Essential (primary) hypertension Follow-up Appointments ppointment in 1 week. - Wednesday Maykayla Highley 03/07/2023 1030 room 7 Return A ppointment in 2 weeks. - Wednesday Jaquarius Seder 03/14/2023 1100 room 8 Return A Other: - brick up oral antibiotics from pharmacy and start today. Bring in your compression stockings next week for provider to evaluate. Bathing/ Shower/ Hygiene May  shower with protection but do not get wound dressing(s) wet. Protect dressing(s) with water repellant cover (for example, large plastic bag) or a cast cover and may then take shower. Edema Control - Lymphedema / SCD / Other Elevate legs to the level of the heart or above for 30 minutes daily and/or when sitting for 3-4 times a day throughout the day. Avoid standing for long periods of time. Exercise regularly Wound Treatment Wound #1 - Lower Leg Wound Laterality: Right, Lateral Marvin Carr, Marvin Carr ([id number]) [id number] Page 2 of 5 Cleanser: Soap and Water 1 x Per Week/30 Days Discharge Instructions: May shower and wash wound with dial antibacterial soap and water prior to dressing change. Cleanser: Vashe 5.8 (oz) 1 x Per Week/30 Days Discharge Instructions: Cleanse the wound with Vashe prior to applying a clean dressing using gauze sponges, not tissue or cotton balls. Peri-Wound Care: Triamcinolone Carr (g) 1 x Per Week/30 Days Discharge Instructions: Use triamcinolone Carr (g) as directed Peri-Wound Care: Sween Lotion (Moisturizing lotion) 1 x Per Week/30 Days Discharge Instructions: Apply moisturizing lotion as directed Prim Dressing: Maxorb Extra Ag+ Alginate Dressing, 4x4.75 (in/in) 1 x Per Week/30 Days ary Discharge Instructions: Apply to wound bed as instructed Secondary Dressing: ABD Pad, 8x10 1 x Per Week/30 Days Discharge Instructions: Apply over primary dressing as directed. Compression Wrap: Urgo K2, (equivalent to a 4 layer) two layer compression system, regular 1 x Per Week/30 Days Discharge Instructions: Apply Urgo K2 as directed (alternative to 4 layer compression). Wound #2 - Lower Leg Wound Laterality: Right, Posterior Cleanser: Soap and Water 1 x Per Week/30 Days Discharge Instructions: May shower and wash wound with dial antibacterial soap and water prior to dressing change. Cleanser: Vashe 5.8 (oz) 1 x Per Week/30 Days Discharge Instructions:  Cleanse the wound with Vashe prior to applying a clean dressing using gauze sponges, not tissue or cotton balls. Peri-Wound Care: Triamcinolone Carr (g) 1 x Per Week/30 Days Discharge Instructions: Use  triamcinolone Carr (g) as directed Peri-Wound Care: Sween Lotion (Moisturizing lotion) 1 x Per Week/30 Days Discharge Instructions: Apply moisturizing lotion as directed Prim Dressing: Maxorb Extra Ag+ Alginate Dressing, 4x4.75 (in/in) 1 x Per Week/30 Days ary Discharge Instructions: Apply to wound bed as instructed Secondary Dressing: ABD Pad, 8x10 1 x Per Week/30 Days Discharge Instructions: Apply over primary dressing as directed. Compression Wrap: Urgo K2, (equivalent to a 4 layer) two layer compression system, regular 1 x Per Week/30 Days Discharge Instructions: Apply Urgo K2 as directed (alternative to 4 layer compression). Wound #3 - Lower Leg Wound Laterality: Left, Posterior Cleanser: Soap and Water 1 x Per Week/30 Days Discharge Instructions: May shower and wash wound with dial antibacterial soap and water prior to dressing change. Cleanser: Vashe 5.8 (oz) 1 x Per Week/30 Days Discharge Instructions: Cleanse the wound with Vashe prior to applying a clean dressing using gauze sponges, not tissue or cotton balls. Peri-Wound Care: Triamcinolone Carr (g) 1 x Per Week/30 Days Discharge Instructions: Use triamcinolone Carr (g) as directed Peri-Wound Care: Sween Lotion (Moisturizing lotion) 1 x Per Week/30 Days Discharge Instructions: Apply moisturizing lotion as directed Prim Dressing: Maxorb Extra Ag+ Alginate Dressing, 4x4.75 (in/in) 1 x Per Week/30 Days ary Discharge Instructions: Apply to wound bed as instructed Secondary Dressing: ABD Pad, 8x10 1 x Per Week/30 Days Discharge Instructions: Apply over primary dressing as directed. Compression Wrap: Urgo K2, (equivalent to a 4 layer) two layer compression system, regular 1 x Per Week/30 Days Discharge Instructions: Apply Urgo K2 as directed  (alternative to 4 layer compression). Wound #4 - Lower Leg Wound Laterality: Left, Lateral Cleanser: Soap and Water 1 x Per Week/30 Days Discharge Instructions: May shower and wash wound with dial antibacterial soap and water prior to dressing change. Cleanser: Vashe 5.8 (oz) 1 x Per Week/30 Days Discharge Instructions: Cleanse the wound with Vashe prior to applying a clean dressing using gauze sponges, not tissue or cotton balls. Peri-Wound Care: Triamcinolone Carr (g) 1 x Per Week/30 Days Discharge Instructions: Use triamcinolone Carr (g) as directed Peri-Wound Care: Sween Lotion (Moisturizing lotion) 1 x Per Week/30 Days Marvin Carr, Marvin Carr ([id number]) [id number] Page 3 of 5 Discharge Instructions: Apply moisturizing lotion as directed Prim Dressing: Maxorb Extra Ag+ Alginate Dressing, 4x4.75 (in/in) 1 x Per Week/30 Days ary Discharge Instructions: Apply to wound bed as instructed Secondary Dressing: ABD Pad, 8x10 1 x Per Week/30 Days Discharge Instructions: Apply over primary dressing as directed. Compression Wrap: Urgo K2, (equivalent to a 4 layer) two layer compression system, regular 1 x Per Week/30 Days Discharge Instructions: Apply Urgo K2 as directed (alternative to 4 layer compression). Patient Medications llergies: No Known Drug Allergies A Notifications Medication Indication Start End 02/28/2023 doxycycline hyclate DOSE 1 - oral 100 mg capsule - 1 capsule oral twice a day x 14 days Electronic Signature(s) Signed: 02/28/2023 3:47:36 PM By: Allen Derry PA-C Previous Signature: 02/28/2023 3:27:24 PM Version By: Shawn Stall RN, BSN Entered By: Allen Derry on 02/28/2023 Carr:47:35 -------------------------------------------------------------------------------- Problem List Details Patient Name: Date of Service: Marvin Carr 02/28/2023 8:30 A M Medical Record Number: [id number] Patient Account Number: [id number] Date of Birth/Sex: Treating  RN: [birth year] (67 y.o. M) Primary Care Provider: Nita Sells Other Clinician: Referring Provider: Treating Provider/Extender: Maryan Puls in Treatment: 0 Active Problems ICD-10 Encounter Code Description Active Date MDM Diagnosis I89.0 Lymphedema, not elsewhere classified 02/28/2023 No Yes I87.333 Chronic venous hypertension (idiopathic) with ulcer and inflammation of 02/28/2023 No Yes bilateral lower extremity  D22.025 Non-pressure chronic ulcer of other part of left lower leg with fat layer exposed6/19/2024 No Yes L97.812 Non-pressure chronic ulcer of other part of right lower leg with fat layer 02/28/2023 No Yes exposed I10 Essential (primary) hypertension 02/28/2023 No Yes Inactive Problems Resolved Problems Marvin Carr, Marvin Carr ([id number]) [id number] Page 4 of 5 Electronic Signature(s) Signed: 02/28/2023 9:33:07 AM By: Allen Derry PA-C Entered By: Allen Derry on 02/28/2023 09:33:07 -------------------------------------------------------------------------------- HxROS Details Patient Name: Date of Service: Marvin Carr 02/28/2023 8:30 A M Medical Record Number: [id number] Patient Account Number: [id number] Date of Birth/Sex: Treating RN: Marvin Carr, Marvin (67 y.o. Marvin Carr Primary Care Provider: Nita Sells Other Clinician: Referring Provider: Treating Provider/Extender: Maryan Puls in Treatment: 0 Hematologic/Lymphatic Medical History: Positive for: Lymphedema Respiratory Medical History: Positive for: Sleep Apnea Cardiovascular Medical History: Positive for: Coronary Artery Disease; Hypertension Gastrointestinal Medical History: Past Medical History Notes: lesion of liver- started bile duct Endocrine Medical History: Past Medical History Notes: prediabetic 5.8 07/2022 Neurologic Medical History: Positive for: Neuropathy Oncologic Medical History: Positive for: Received Chemotherapy - last  September 2023- liver and bile duct Past Medical History Notes: 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 Psychiatric Medical History: Past Medical History Notes: anxiety Immunizations Pneumococcal Vaccine: Received Pneumococcal Vaccination: No Implantable Devices No devices added Hospitalization / Surgery History Marvin Carr, Marvin Carr ([id number]) [id number] Page 5 of 5 Type of Hospitalization/Surgery port-a-cath placement 09/2021- chemo right hip placement 2020 Family and Social History Lung Disease: Yes - Father; Former smoker - quit 2012; Marital Status - Divorced; Alcohol Use: Never; Drug Use: No History; Caffeine Use: Never; Financial Concerns: No; Food, Clothing or Shelter Needs: No; Support System Lacking: No; Transportation Concerns: No Electronic Signature(s) Signed: 02/28/2023 3:27:24 PM By: Shawn Stall RN, BSN Signed: 03/01/2023 5:52:49 PM By: Allen Derry PA-C Entered By: Shawn Stall on 02/28/2023 09:04:18 -------------------------------------------------------------------------------- SuperBill Details Patient Name: Date of Service: Marvin Carr 02/28/2023 Medical Record Number: [id number] Patient Account Number: [id number] Date of Birth/Sex: Treating RN: Marvin Carr, Marvin (67 y.o. 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 Modifier: Quantity: 1 Electronic Signature(s) Signed: 02/28/2023 3:27:24 PM By: Shawn Stall RN, BSN Signed: 03/01/2023 5:52:49 PM By: Allen Derry PA-C Entered By: Shawn Stall on 02/28/2023 09:39:06

## 2023-03-04 LAB — T4: T4, Total: 6.2 ug/dL (ref 4.5–12.0)

## 2023-03-05 ENCOUNTER — Ambulatory Visit (HOSPITAL_COMMUNITY): Payer: Medicare Other

## 2023-03-07 ENCOUNTER — Ambulatory Visit (HOSPITAL_COMMUNITY): Payer: Medicare Other | Admitting: Physical Therapy

## 2023-03-07 ENCOUNTER — Encounter (HOSPITAL_BASED_OUTPATIENT_CLINIC_OR_DEPARTMENT_OTHER): Payer: Medicare Other | Admitting: Physician Assistant

## 2023-03-07 DIAGNOSIS — I87333 Chronic venous hypertension (idiopathic) with ulcer and inflammation of bilateral lower extremity: Secondary | ICD-10-CM | POA: Diagnosis not present

## 2023-03-07 NOTE — Progress Notes (Addendum)
LARY, ECKARDT ([id number]) [id number] Page 1 of 5 Visit Report for 03/07/2023 Chief Complaint Document Details Patient Name: Date of Service: Marvin Carr 03/07/2023 10:30 A M Medical Record Number: [id number] Patient Account Number: [id number] Date of Birth/Sex: Treating RN: 1956-03-23 (67 y.o. M) Primary Care Provider: Cassie Freer, Jonny Ruiz Other Clinician: Referring Provider: Treating Provider/Extender: Hilliard Clark in Treatment: 1 Information Obtained from: Patient Chief Complaint Bilateral LE Ulcers Electronic Signature(s) Signed: 03/07/2023 10:28:59 AM By: Allen Derry PA-C Entered By: Allen Derry on 03/07/2023 10:28:59 -------------------------------------------------------------------------------- HPI Details Patient Name: Date of Service: Marvin Carr 03/07/2023 10:30 A M Medical Record Number: [id number] Patient Account Number: [id number] Date of Birth/Sex: Treating RN: 1955/11/18 (67 y.o. M) Primary Care Provider: Cassie Freer, Jonny Ruiz Other Clinician: Referring Provider: Treating Provider/Extender: Hilliard Clark in Treatment: 1 History of Present Illness HPI Description: 02-28-2023 upon evaluation today patient presents for initial inspection here in our clinic concerning issues has been having with wounds over the lower extremities bilaterally left and right secondary to what appears to be primarily venous stasis and lymphedema. These wounds have been problematic for him since around at least September 2023 intermittently he said. With that being said this does not mean that he has had wounds consistently throughout this time but I am unsure exactly how long the current ones have been present. Fortunately I do not see any signs of active infection systemically at this time and I think that he is really doing pretty well in regard to that. However I think there may be some local cellulitis noted based on  what we are seeing as well and I think that needs to be addressed. Patient does have a history of chronic venous insufficiency, lymphedema, and hypertension. 03-07-2023 upon evaluation today patient actually appears to be doing excellent in fact he appears to be completely healed. I am very pleased with where we stand he has done well with compression wraps but they did slip down quite a bit on him over the past week which was not good. Electronic Signature(s) Signed: 03/07/2023 1:20:43 PM By: Allen Derry PA-C Entered By: Allen Derry on 03/07/2023 13:20:43 Physical Exam Details -------------------------------------------------------------------------------- Marvin Carr ([id number]) [id number] Page 2 of 5 Patient Name: Date of Service: Marvin Carr 03/07/2023 10:30 A M Medical Record Number: [id number] Patient Account Number: [id number] Date of Birth/Sex: Treating RN: [birth year] (67 y.o. M) Primary Care Provider: Cassie Freer, Jonny Ruiz Other Clinician: Referring Provider: Treating Provider/Extender: Hilliard Clark in Treatment: 1 Constitutional Well-nourished and well-hydrated in no acute distress. Respiratory normal breathing without difficulty. Psychiatric this patient is able to make decisions and demonstrates good insight into disease process. Alert and Oriented x 3. pleasant and cooperative. Notes Based on what I am seeing the patient does appear to be completely healed. I do not see anything open he does have compression socks and I did put one of them on it seems to fit quite nicely and I think we do a good job keeping the swelling under control. This also be better and easier for him to keep up. Electronic Signature(s) Signed: 03/07/2023 1:21:28 PM By: Allen Derry PA-C Entered By: Allen Derry on 03/07/2023 13:21:28 -------------------------------------------------------------------------------- Physician Orders Details Patient  Name: Date of Service: Marvin Carr 03/07/2023 10:30 A M Medical Record Number: [id number] Patient Account Number: [id number] Date of Birth/Sex: Treating RN: 1955-10-25 (67 y.o. Marvin Carr Primary Care Provider: Cassie Freer, Jonny Ruiz  Other Clinician: Referring Provider: Treating Provider/Extender: Hilliard Clark in Treatment: 1 Verbal / Phone Orders: No Diagnosis Coding ICD-10 Coding Code Description I89.0 Lymphedema, not elsewhere classified I87.333 Chronic venous hypertension (idiopathic) with ulcer and inflammation of bilateral lower extremity L97.822 Non-pressure chronic ulcer of other part of left lower leg with fat layer exposed L97.812 Non-pressure chronic ulcer of other part of right lower leg with fat layer exposed I10 Essential (primary) hypertension Discharge From [facility] Discharge from Wound Care Center - Congratulations! Electronic Signature(s) Signed: 03/07/2023 4:15:09 PM By: Allen Derry PA-C Signed: 03/07/2023 5:11:18 PM By: Karie Schwalbe RN Entered By: Karie Schwalbe on 03/07/2023 11:38:23 -------------------------------------------------------------------------------- Problem List Details Patient Name: Date of Service: Marvin Carr 03/07/2023 10:30 A M Medical Record Number: [id number] Patient Account Number: [id number] Marvin Carr, Marvin Carr ([id number]) [id number] Page 3 of 5 Date of Birth/Sex: Treating RN: 02/28/1956 (67 y.o. M) Primary Care Provider: Cassie Freer, John Other Clinician: Referring Provider: Treating Provider/Extender: Hilliard Clark in Treatment: 1 Active Problems ICD-10 Encounter Code Description Active Date MDM Diagnosis I89.0 Lymphedema, not elsewhere classified 02/28/2023 No Yes I87.333 Chronic venous hypertension (idiopathic) with ulcer and inflammation of 02/28/2023 No Yes bilateral lower extremity L97.822 Non-pressure chronic ulcer of other part of left lower  leg with fat layer exposed6/19/2024 No Yes L97.812 Non-pressure chronic ulcer of other part of right lower leg with fat layer 02/28/2023 No Yes exposed I10 Essential (primary) hypertension 02/28/2023 No Yes Inactive Problems Resolved Problems Electronic Signature(s) Signed: 03/07/2023 10:28:45 AM By: Allen Derry PA-C Entered By: Allen Derry on 03/07/2023 10:28:45 -------------------------------------------------------------------------------- Progress Note Details Patient Name: Date of Service: Marvin Carr 03/07/2023 10:30 A M Medical Record Number: [id number] Patient Account Number: [id number] Date of Birth/Sex: Treating RN: Dec 08, 1955 (67 y.o. M) Primary Care Provider: Cassie Freer, John Other Clinician: Referring Provider: Treating Provider/Extender: Hilliard Clark in Treatment: 1 Subjective Chief Complaint Information obtained from Patient Bilateral LE Ulcers History of Present Illness (HPI) 02-28-2023 upon evaluation today patient presents for initial inspection here in our clinic concerning issues has been having with wounds over the lower extremities bilaterally left and right secondary to what appears to be primarily venous stasis and lymphedema. These wounds have been problematic for him since around at least September 2023 intermittently he said. With that being said this does not mean that he has had wounds consistently throughout this time but I am unsure exactly how long the current ones have been present. Fortunately I do not see any signs of active infection systemically at this time and I think that he is really doing pretty well in regard to that. However I think there may be some local cellulitis noted based on what we are seeing as well and I think that needs to be addressed. Patient does have a history of chronic venous insufficiency, lymphedema, and hypertension. 03-07-2023 upon evaluation today patient actually appears to be doing excellent in  fact he appears to be completely healed. I am very pleased with where we stand he has done well with compression wraps but they did slip down quite a bit on him over the past week which was not good. Marvin Carr, Marvin Carr ([id number]) [id number] Page 4 of 5 Objective Constitutional Well-nourished and well-hydrated in no acute distress. Vitals Time Taken: 10:41 AM, Height: 72 in, Weight: 285 lbs, BMI: 38.6, Temperature: 97.9 F, Pulse: 76 bpm, Respiratory Rate: 20 breaths/min, Blood Pressure: 128/80 mmHg. Respiratory normal breathing without  difficulty. Psychiatric this patient is able to make decisions and demonstrates good insight into disease process. Alert and Oriented x 3. pleasant and cooperative. General Notes: Based on what I am seeing the patient does appear to be completely healed. I do not see anything open he does have compression socks and I did put one of them on it seems to fit quite nicely and I think we do a good job keeping the swelling under control. This also be better and easier for him to keep up. Integumentary (Hair, Skin) Wound #1 status is Healed - Epithelialized. Original cause of wound was Gradually Appeared. The date acquired was: 05/12/2022. The wound has been in treatment 1 weeks. The wound is located on the Right,Lateral Lower Leg. The wound measures 0cm length x 0cm width x 0cm depth; 0cm^2 area and 0cm^3 volume. There is no tunneling or undermining noted. There is a none present amount of drainage noted. The wound margin is distinct with the outline attached to the wound base. There is no granulation within the wound bed. There is no necrotic tissue within the wound bed. The periwound skin appearance had no abnormalities noted for texture. The periwound skin appearance had no abnormalities noted for moisture. The periwound skin appearance had no abnormalities noted for color. Periwound temperature was noted as No Abnormality. Wound #2 status  is Healed - Epithelialized. Original cause of wound was Gradually Appeared. The date acquired was: 06/08/2022. The wound has been in treatment 1 weeks. The wound is located on the Right,Posterior Lower Leg. The wound measures 0cm length x 0cm width x 0cm depth; 0cm^2 area and 0cm^3 volume. There is no tunneling or undermining noted. There is a none present amount of drainage noted. The wound margin is distinct with the outline attached to the wound base. There is no granulation within the wound bed. There is no necrotic tissue within the wound bed. The periwound skin appearance had no abnormalities noted for texture. The periwound skin appearance had no abnormalities noted for moisture. The periwound skin appearance had no abnormalities noted for color. Periwound temperature was noted as No Abnormality. Wound #3 status is Healed - Epithelialized. Original cause of wound was Gradually Appeared. The date acquired was: 06/08/2022. The wound has been in treatment 1 weeks. The wound is located on the Left,Posterior Lower Leg. The wound measures 0cm length x 0cm width x 0cm depth; 0cm^2 area and 0cm^3 volume. There is no tunneling or undermining noted. There is a none present amount of drainage noted. The wound margin is distinct with the outline attached to the wound base. There is no granulation within the wound bed. There is no necrotic tissue within the wound bed. The periwound skin appearance had no abnormalities noted for texture. The periwound skin appearance had no abnormalities noted for moisture. The periwound skin appearance had no abnormalities noted for color. Periwound temperature was noted as No Abnormality. Wound #4 status is Healed - Epithelialized. Original cause of wound was Gradually Appeared. The date acquired was: 06/08/2022. The wound has been in treatment 1 weeks. The wound is located on the Left,Lateral Lower Leg. The wound measures 0cm length x 0cm width x 0cm depth; 0cm^2 area and  0cm^3 volume. There is no tunneling or undermining noted. There is a none present amount of drainage noted. The wound margin is distinct with the outline attached to the wound base. There is no granulation within the wound bed. There is no necrotic tissue within the wound bed. The periwound skin  appearance had no abnormalities noted for texture. The periwound skin appearance had no abnormalities noted for moisture. The periwound skin appearance had no abnormalities noted for color. Periwound temperature was noted as No Abnormality. Assessment Active Problems ICD-10 Lymphedema, not elsewhere classified Chronic venous hypertension (idiopathic) with ulcer and inflammation of bilateral lower extremity Non-pressure chronic ulcer of other part of left lower leg with fat layer exposed Non-pressure chronic ulcer of other part of right lower leg with fat layer exposed Essential (primary) hypertension Plan Discharge From [facility] Services: Discharge from Wound Care Center - Congratulations! 1. I am good recommend based on what we are seeing that we have the patient going continue to monitor for any signs of infection or worsening. Based on what I see I do believe that he is tolerating the dressing changes without complication and in general I do believe that he is making good progress I think he is ready for discharge. Marvin Carr, Marvin Carr ([id number]) [id number] Page 5 of 5 2. I am good recommend that he should be wearing compression socks at all times during the day takes them off at night and he voiced understanding. Will see him back for follow-up visit as needed. Electronic Signature(s) Signed: 03/07/2023 1:22:03 PM By: Allen Derry PA-C Entered By: Allen Derry on 03/07/2023 13:22:02 -------------------------------------------------------------------------------- SuperBill Details Patient Name: Date of Service: Marvin Carr 03/07/2023 Medical Record Number:  [id number] Patient Account Number: [id number] Date of Birth/Sex: Treating RN: 1955-10-23 (67 y.o. M) Primary Care Provider: Cassie Freer, John Other Clinician: Referring Provider: Treating Provider/Extender: Hilliard Clark in Treatment: 1 Diagnosis Coding ICD-10 Codes Code Description I89.0 Lymphedema, not elsewhere classified I87.333 Chronic venous hypertension (idiopathic) with ulcer and inflammation of bilateral lower extremity L97.822 Non-pressure chronic ulcer of other part of left lower leg with fat layer exposed L97.812 Non-pressure chronic ulcer of other part of right lower leg with fat layer exposed I10 Essential (primary) hypertension Facility Procedures : CPT4 Code: [id number] Description: 46270 - WOUND CARE VISIT-LEV 2 EST PT Modifier: Quantity: 1 Physician Procedures : CPT4 Code Description Modifier [id number] - WC PHYS LEVEL 3 - EST PT ICD-10 Diagnosis Description I89.0 Lymphedema, not elsewhere classified I87.333 Chronic venous hypertension (idiopathic) with ulcer and inflammation of bilateral lower extremity  L97.822 Non-pressure chronic ulcer of other part of left lower leg with fat layer exposed L97.812 Non-pressure chronic ulcer of other part of right lower leg with fat layer exposed Quantity: 1 Electronic Signature(s) Signed: 03/07/2023 3:16:08 PM By: Pearletha Alfred Signed: 03/07/2023 4:15:09 PM By: Allen Derry PA-C Previous Signature: 03/07/2023 1:25:12 PM Version By: Allen Derry PA-C Entered By: Pearletha Alfred on 03/07/2023 15:16:08

## 2023-03-12 ENCOUNTER — Ambulatory Visit (HOSPITAL_COMMUNITY): Payer: Medicare Other | Admitting: Physical Therapy

## 2023-03-12 NOTE — Progress Notes (Signed)
[place], Gerlene Burdock ([id number]) [id number] Page 1 of 9 Visit Report for 03/07/2023 Arrival Information Details Patient Name: Date of Service: Alford Highland RD 03/07/2023 10:30 A M Medical Record Number: [id number] Patient Account Number: [id number] Date of Birth/Sex: Treating RN: [birth year] (67 y.o. M) Primary Care Kanisha Duba: Cassie Freer, John Other Clinician: Referring Jolanda Mccann: Treating Tavaras Goody/Extender: Hilliard Clark in Treatment: 1 Visit Information History Since Last Visit All ordered tests and consults were completed: No Patient Arrived: Ambulatory Added or deleted any medications: No Arrival Time: 10:41 Any new allergies or adverse reactions: No Accompanied By: self Had a fall or experienced change in No Transfer Assistance: None activities of daily living that may affect Patient Identification Verified: Yes risk of falls: Secondary Verification Process Completed: Yes Signs or symptoms of abuse/neglect since last visito No Patient Requires Transmission-Based Precautions: No Hospitalized since last visit: No Patient Has Alerts: No Implantable device outside of the clinic excluding No cellular tissue based products placed in the center since last visit: Pain Present Now: No Electronic Signature(s) Signed: 03/07/2023 12:00:09 PM By: Dayton Scrape Entered By: Dayton Scrape on 03/07/2023 10:41:51 -------------------------------------------------------------------------------- Clinic Level of Care Assessment Details Patient Name: Date of Service: Alford Highland RD 03/07/2023 10:30 A M Medical Record Number: [id number] Patient Account Number: [id number] Date of Birth/Sex: Treating RN: 10/18/1955 (67 y.o. M) Primary Care Eder Macek: Cassie Freer, John Other Clinician: Referring Danira Nylander: Treating Mae Cianci/Extender: Hilliard Clark in Treatment: 1 Clinic Level of Care Assessment Items TOOL 4 Quantity Score []  - 0 Use when  only an EandM is performed on FOLLOW-UP visit ASSESSMENTS - Nursing Assessment / Reassessment X- 1 10 Reassessment of Co-morbidities (includes updates in patient status) X- 1 5 Reassessment of Adherence to Treatment Plan ASSESSMENTS - Wound and Skin A ssessment / Reassessment X - Simple Wound Assessment / Reassessment - one wound 1 5 []  - 0 Complex Wound Assessment / Reassessment - multiple wounds []  - 0 Dermatologic / Skin Assessment (not related to wound area) ASSESSMENTS - Focused Assessment []  - 0 Circumferential Edema Measurements - multi extremities []  - 0 Nutritional Assessment / Counseling / Intervention Rufina Falco ([id number]) [id number] Page 2 of 9 []  - 0 Lower Extremity Assessment (monofilament, tuning fork, pulses) []  - 0 Peripheral Arterial Disease Assessment (using hand held doppler) ASSESSMENTS - Ostomy and/or Continence Assessment and Care []  - 0 Incontinence Assessment and Management []  - 0 Ostomy Care Assessment and Management (repouching, etc.) PROCESS - Coordination of Care X - Simple Patient / Family Education for ongoing care 1 15 []  - 0 Complex (extensive) Patient / Family Education for ongoing care X- 1 10 Staff obtains Chiropractor, Records, T Results / Process Orders est []  - 0 Staff telephones HHA, Nursing Homes / Clarify orders / etc []  - 0 Routine Transfer to another Facility (non-emergent condition) []  - 0 Routine Hospital Admission (non-emergent condition) []  - 0 New Admissions / Manufacturing engineer / Ordering NPWT Apligraf, etc. , []  - 0 Emergency Hospital Admission (emergent condition) X- 1 10 Simple Discharge Coordination []  - 0 Complex (extensive) Discharge Coordination PROCESS - Special Needs []  - 0 Pediatric / Minor Patient Management []  - 0 Isolation Patient Management []  - 0 Hearing / Language / Visual special needs []  - 0 Assessment of Community assistance (transportation, D/C planning,  etc.) []  - 0 Additional assistance / Altered mentation []  - 0 Support Surface(s) Assessment (bed, cushion, seat, etc.) INTERVENTIONS - Wound Cleansing / Measurement X - Simple Wound  Cleansing - one wound 1 5 []  - 0 Complex Wound Cleansing - multiple wounds X- 1 5 Wound Imaging (photographs - any number of wounds) []  - 0 Wound Tracing (instead of photographs) X- 1 5 Simple Wound Measurement - one wound []  - 0 Complex Wound Measurement - multiple wounds INTERVENTIONS - Wound Dressings []  - 0 Small Wound Dressing one or multiple wounds []  - 0 Medium Wound Dressing one or multiple wounds []  - 0 Large Wound Dressing one or multiple wounds []  - 0 Application of Medications - topical []  - 0 Application of Medications - injection INTERVENTIONS - Miscellaneous []  - 0 External ear exam []  - 0 Specimen Collection (cultures, biopsies, blood, body fluids, etc.) []  - 0 Specimen(s) / Culture(s) sent or taken to Lab for analysis []  - 0 Patient Transfer (multiple staff / Nurse, adult / Similar devices) []  - 0 Simple Staple / Suture removal (25 or less) []  - 0 Complex Staple / Suture removal (26 or more) []  - 0 Hypo / Hyperglycemic Management (close monitor of Blood Glucose) Rufina Falco ([id number]) [id number] Page 3 of 9 []  - 0 Ankle / Brachial Index (ABI) - do not check if billed separately X- 1 5 Vital Signs Has the patient been seen at the hospital within the last three years: Yes Total Score: 75 Level Of Care: New/Established - Level 2 Electronic Signature(s) Signed: 03/12/2023 2:58:52 PM By: Pearletha Alfred Entered By: Pearletha Alfred on 03/07/2023 15:15:58 -------------------------------------------------------------------------------- Encounter Discharge Information Details Patient Name: Date of Service: Alford Highland RD 03/07/2023 10:30 A M Medical Record Number: [id number] Patient Account Number: [id number] Date of Birth/Sex: Treating  RN: 1955-11-17 (67 y.o. M) Primary Care Lisha Vitale: Cassie Freer, Jonny Ruiz Other Clinician: Referring Ivette Castronova: Treating Alajiah Dutkiewicz/Extender: Hilliard Clark in Treatment: 1 Encounter Discharge Information Items Discharge Condition: Stable Ambulatory Status: Ambulatory Discharge Destination: Home Transportation: Private Auto Accompanied By: wife Schedule Follow-up Appointment: No Clinical Summary of Care: Electronic Signature(s) Signed: 03/07/2023 3:16:48 PM By: Pearletha Alfred Entered By: Pearletha Alfred on 03/07/2023 15:16:48 -------------------------------------------------------------------------------- Lower Extremity Assessment Details Patient Name: Date of Service: Alford Highland RD 03/07/2023 10:30 A M Medical Record Number: [id number] Patient Account Number: [id number] Date of Birth/Sex: Treating RN: 1956-06-03 (67 y.o. M) Primary Care Talaya Lamprecht: Cassie Freer, Jonny Ruiz Other Clinician: Referring Ajamu Maxon: Treating Kamrin Sibley/Extender: Hilliard Clark in Treatment: 1 Edema Assessment Assessed: [Left: No] [Right: No] Edema: [Left: Yes] [Right: Yes] Calf Left: Right: Point of Measurement: 33 cm From Medial Instep 44 cm 44 cm Ankle Left: Right: Point of Measurement: 12 cm From Medial Instep 24 cm 24 cm Electronic Signature(s) Signed: 03/09/2023 11:18:15 AM By: Mable Paris, Gerlene Burdock ([id number]) AM By: Gerline Legacy.pdf Page 4 of 9 Signed: 03/09/2023 11:18:15 Entered By: Thayer Dallas on 03/07/2023 11:19:06 -------------------------------------------------------------------------------- Multi-Disciplinary Care Plan Details Patient Name: Date of Service: Alford Highland RD 03/07/2023 10:30 A M Medical Record Number: [id number] Patient Account Number: [id number] Date of Birth/Sex: Treating RN: August 20, 1956 (67 y.o. M) Primary Care Linkyn Gobin: Cassie Freer, John Other Clinician: Referring Karsten Howry: Treating Arkeem Harts/Extender:  Hilliard Clark in Treatment: 1 Multidisciplinary Care Plan reviewed with physician Active Inactive Electronic Signature(s) Signed: 03/07/2023 3:13:45 PM By: Pearletha Alfred Entered By: Pearletha Alfred on 03/07/2023 15:13:45 -------------------------------------------------------------------------------- Pain Assessment Details Patient Name: Date of Service: Alford Highland RD 03/07/2023 10:30 A M Medical Record Number: [id number] Patient Account Number: [id number] Date of Birth/Sex: Treating RN: 11/06/1955 (67 y.o. M) Primary Care Vincentina Sollers:  Duard Larsen Other Clinician: Referring Birgit Nowling: Treating Doyal Saric/Extender: Hilliard Clark in Treatment: 1 Active Problems Location of Pain Severity and Description of Pain Patient Has Paino No Site Locations Pain Management and Medication Current Pain Management: Electronic Signature(s) JACQUEZ, STOLARZ ([id number]) [id number] Page 5 of 9 Signed: 03/07/2023 12:00:09 PM By: Dayton Scrape Entered By: Dayton Scrape on 03/07/2023 10:42:26 -------------------------------------------------------------------------------- Patient/Caregiver Education Details Patient Name: Date of Service: Alford Highland RD 6/26/2024andnbsp10:30 A M Medical Record Number: [id number] Patient Account Number: [id number] Date of Birth/Gender: Treating RN: 07-Feb-1956 (67 y.o. M) Primary Care Physician: Cassie Freer, Jonny Ruiz Other Clinician: Referring Physician: Treating Physician/Extender: Hilliard Clark in Treatment: 1 Education Assessment Education Provided To: Patient Education Topics Provided Wound/Skin Impairment: Electronic Signature(s) Signed: 03/12/2023 2:58:52 PM By: Pearletha Alfred Entered By: Pearletha Alfred on 03/07/2023 15:15:09 -------------------------------------------------------------------------------- Wound Assessment Details Patient Name: Date of Service: Alford Highland RD  03/07/2023 10:30 A M Medical Record Number: [id number] Patient Account Number: [id number] Date of Birth/Sex: Treating RN: [birth year] (67 y.o. M) Primary Care Adayah Arocho: Cassie Freer, Jonny Ruiz Other Clinician: Referring Alyssamae Klinck: Treating Indy Prestwood/Extender: Hilliard Clark in Treatment: 1 Wound Status Wound Number: 1 Primary Venous Leg Ulcer Etiology: Wound Location: Right, Lateral Lower Leg Secondary Lymphedema Wounding Event: Gradually Appeared Etiology: Date Acquired: 05/12/2022 Wound Healed - Epithelialized Weeks Of Treatment: 1 Status: Clustered Wound: No Comorbid Lymphedema, Sleep Apnea, Coronary Artery Disease, History: Hypertension, Neuropathy, Received Chemotherapy Photos MAXTON, COVILL ([id number]) [id number] Page 6 of 9 Wound Measurements Length: (cm) Width: (cm) Depth: (cm) Area: (cm) Volume: (cm) 0 % Reduction in Area: 100% 0 % Reduction in Volume: 100% 0 Epithelialization: Large (67-100%) 0 Tunneling: No 0 Undermining: No Wound Description Classification: Full Thickness Without Exposed Support Structures Wound Margin: Distinct, outline attached Exudate Amount: None Present Foul Odor After Cleansing: No Slough/Fibrino No Wound Bed Granulation Amount: None Present (0%) Exposed Structure Necrotic Amount: None Present (0%) Fascia Exposed: No Fat Layer (Subcutaneous Tissue) Exposed: No Tendon Exposed: No Muscle Exposed: No Joint Exposed: No Bone Exposed: No Periwound Skin Texture Texture Color No Abnormalities Noted: Yes No Abnormalities Noted: Yes Moisture Temperature / Pain No Abnormalities Noted: Yes Temperature: No Abnormality Electronic Signature(s) Signed: 03/07/2023 5:11:18 PM By: Karie Schwalbe RN Entered By: Karie Schwalbe on 03/07/2023 11:36:02 -------------------------------------------------------------------------------- Wound Assessment Details Patient Name: Date of Service: Alford Highland RD  03/07/2023 10:30 A M Medical Record Number: [id number] Patient Account Number: [id number] Date of Birth/Sex: Treating RN: [birth year] (67 y.o. M) Primary Care Anastasia Tompson: Cassie Freer, Jonny Ruiz Other Clinician: Referring Alanzo Lamb: Treating Sweta Halseth/Extender: Hilliard Clark in Treatment: 1 Wound Status Wound Number: 2 Primary Venous Leg Ulcer Etiology: Wound Location: Right, Posterior Lower Leg Secondary Lymphedema Wounding Event: Gradually Appeared Etiology: Date Acquired: 06/08/2022 Wound Healed - Epithelialized Weeks Of Treatment: 1 Status: Clustered Wound: No Comorbid Lymphedema, Sleep Apnea, Coronary Artery Disease, History: Hypertension, Neuropathy, Received Chemotherapy Photos Wound Measurements DAXTER, STUBBINS ([id number]) Length: (cm) Width: (cm) Depth: (cm) Area: (cm) Volume: (cm) [id number] Page 7 of 9 0 % Reduction in Area: 100% 0 % Reduction in Volume: 100% 0 Epithelialization: Large (67-100%) 0 Tunneling: No 0 Undermining: No Wound Description Classification: Full Thickness Without Exposed Support Structures Wound Margin: Distinct, outline attached Exudate Amount: None Present Foul Odor After Cleansing: No Slough/Fibrino No Wound Bed Granulation Amount: None Present (0%) Exposed Structure Necrotic Amount: None Present (0%) Fascia Exposed: No Fat Layer (Subcutaneous Tissue) Exposed: No Tendon Exposed: No  Muscle Exposed: No Joint Exposed: No Bone Exposed: No Periwound Skin Texture Texture Color No Abnormalities Noted: Yes No Abnormalities Noted: Yes Moisture Temperature / Pain No Abnormalities Noted: Yes Temperature: No Abnormality Electronic Signature(s) Signed: 03/07/2023 5:11:18 PM By: Karie Schwalbe RN Entered By: Karie Schwalbe on 03/07/2023 11:36:38 -------------------------------------------------------------------------------- Wound Assessment Details Patient Name: Date of Service: Alford Highland RD  03/07/2023 10:30 A M Medical Record Number: [id number] Patient Account Number: [id number] Date of Birth/Sex: Treating RN: 1956/07/27 (67 y.o. M) Primary Care Penni Penado: Cassie Freer, Jonny Ruiz Other Clinician: Referring Ameya Kutz: Treating Lakeita Panther/Extender: Hilliard Clark in Treatment: 1 Wound Status Wound Number: 3 Primary Venous Leg Ulcer Etiology: Wound Location: Left, Posterior Lower Leg Secondary Lymphedema Wounding Event: Gradually Appeared Etiology: Date Acquired: 06/08/2022 Wound Healed - Epithelialized Weeks Of Treatment: 1 Status: Clustered Wound: No Comorbid Lymphedema, Sleep Apnea, Coronary Artery Disease, History: Hypertension, Neuropathy, Received Chemotherapy Photos Wound Measurements Length: (cm) Rufina Falco ([id number]) Width: (cm) Depth: (cm) Area: (cm) Volume: (cm) 0 % Reduction in Area: 100% [id number] Page 8 of 9 0 % Reduction in Volume: 100% 0 Epithelialization: Large (67-100%) 0 Tunneling: No 0 Undermining: No Wound Description Classification: Full Thickness Without Exposed Support Structures Wound Margin: Distinct, outline attached Exudate Amount: None Present Foul Odor After Cleansing: No Slough/Fibrino No Wound Bed Granulation Amount: None Present (0%) Exposed Structure Necrotic Amount: None Present (0%) Fascia Exposed: No Fat Layer (Subcutaneous Tissue) Exposed: No Tendon Exposed: No Muscle Exposed: No Joint Exposed: No Bone Exposed: No Periwound Skin Texture Texture Color No Abnormalities Noted: Yes No Abnormalities Noted: Yes Moisture Temperature / Pain No Abnormalities Noted: Yes Temperature: No Abnormality Electronic Signature(s) Signed: 03/07/2023 5:11:18 PM By: Karie Schwalbe RN Entered By: Karie Schwalbe on 03/07/2023 11:37:13 -------------------------------------------------------------------------------- Wound Assessment Details Patient Name: Date of Service: Alford Highland RD  03/07/2023 10:30 A M Medical Record Number: [id number] Patient Account Number: [id number] Date of Birth/Sex: Treating RN: May 26, 1956 (67 y.o. M) Primary Care Neziah Braley: Cassie Freer, John Other Clinician: Referring Hally Colella: Treating Elice Crigger/Extender: Hilliard Clark in Treatment: 1 Wound Status Wound Number: 4 Primary Venous Leg Ulcer Etiology: Wound Location: Left, Lateral Lower Leg Secondary Lymphedema Wounding Event: Gradually Appeared Etiology: Date Acquired: 06/08/2022 Wound Healed - Epithelialized Weeks Of Treatment: 1 Status: Clustered Wound: No Comorbid Lymphedema, Sleep Apnea, Coronary Artery Disease, History: Hypertension, Neuropathy, Received Chemotherapy Photos Wound Measurements Length: (cm) Width: (cm) Rufina Falco ([id number]) Depth: (cm) Area: (cm) Volume: (cm) 0 % Reduction in Area: 100% 0 % Reduction in Volume: 100% [id number] Page 9 of 9 0 Epithelialization: Large (67-100%) 0 Tunneling: No 0 Undermining: No Wound Description Classification: Full Thickness Without Exposed Support Structures Wound Margin: Distinct, outline attached Exudate Amount: None Present Foul Odor After Cleansing: No Slough/Fibrino No Wound Bed Granulation Amount: None Present (0%) Exposed Structure Necrotic Amount: None Present (0%) Fascia Exposed: No Fat Layer (Subcutaneous Tissue) Exposed: No Tendon Exposed: No Muscle Exposed: No Joint Exposed: No Bone Exposed: No Periwound Skin Texture Texture Color No Abnormalities Noted: Yes No Abnormalities Noted: Yes Moisture Temperature / Pain No Abnormalities Noted: Yes Temperature: No Abnormality Electronic Signature(s) Signed: 03/07/2023 5:11:18 PM By: Karie Schwalbe RN Entered By: Karie Schwalbe on 03/07/2023 11:37:55 -------------------------------------------------------------------------------- Vitals Details Patient Name: Date of Service: Alford Highland RD 03/07/2023  10:30 A M Medical Record Number: [id number] Patient Account Number: [id number] Date of Birth/Sex: Treating RN: May 30, 1956 (67 y.o. M) Primary Care Uriah Philipson: Cassie Freer, Jonny Ruiz Other Clinician: Referring Alichia Alridge: Treating Luma Clopper/Extender: Linwood Dibbles,  Durward Mallard, Jonny Ruiz Weeks in Treatment: 1 Vital Signs Time Taken: 10:41 Temperature (F): 97.9 Height (in): 72 Pulse (bpm): 76 Weight (lbs): 285 Respiratory Rate (breaths/min): 20 Body Mass Index (BMI): 38.6 Blood Pressure (mmHg): 128/80 Reference Range: 80 - 120 mg / dl Electronic Signature(s) Signed: 03/07/2023 12:00:09 PM By: Dayton Scrape Entered By: Dayton Scrape on 03/07/2023 10:42:16

## 2023-03-14 ENCOUNTER — Ambulatory Visit (HOSPITAL_COMMUNITY): Payer: Medicare Other | Admitting: Physical Therapy

## 2023-03-14 ENCOUNTER — Ambulatory Visit (HOSPITAL_BASED_OUTPATIENT_CLINIC_OR_DEPARTMENT_OTHER): Payer: Medicare Other | Admitting: Physician Assistant

## 2023-03-23 ENCOUNTER — Other Ambulatory Visit: Payer: Self-pay | Admitting: Hematology

## 2023-03-27 ENCOUNTER — Encounter (HOSPITAL_COMMUNITY): Payer: Self-pay | Admitting: Radiology

## 2023-03-27 ENCOUNTER — Ambulatory Visit (HOSPITAL_COMMUNITY)
Admission: RE | Admit: 2023-03-27 | Discharge: 2023-03-27 | Disposition: A | Discharge status: Home / Self Care | Payer: Medicare Other | Source: Ambulatory Visit | Attending: Hematology | Admitting: Hematology

## 2023-03-27 DIAGNOSIS — C221 Intrahepatic bile duct carcinoma: Secondary | ICD-10-CM | POA: Insufficient documentation

## 2023-03-27 DIAGNOSIS — C787 Secondary malignant neoplasm of liver and intrahepatic bile duct: Secondary | ICD-10-CM | POA: Insufficient documentation

## 2023-03-27 MED ORDER — IOHEXOL 300 MG/ML  SOLN
100.0000 mL | Freq: Once | INTRAMUSCULAR | Status: AC | PRN
  Administered 2023-03-27: 100 mL via INTRAVENOUS

## 2023-03-28 ENCOUNTER — Encounter: Payer: Self-pay | Admitting: Internal Medicine

## 2023-03-28 NOTE — Progress Notes (Signed)
Cardiology Office Note  Date: 03/29/2023   ID: Marvin Carr, DOB Jun 01, 1956, MRN [id number]  PCP: Benita Stabile, MD  Chief Complaint:  Chief Complaint  Patient presents with   Reestablish cardiology follow-up   History of Present Illness: Marvin Carr is a 67 y.o. male referred by Dr. Margo Aye to reestablish cardiology follow-up.  He was last evaluated by our office back in 2021.  Cardiac history is reviewed below.  He has had significant health changes in the interim. He now follows with Dr. Ellin Saba in the oncology clinic with history of cholangiocarcinoma with peritoneal carcinomatosis.  He has been treated with chemotherapy including gemcitabine, cisplatin and durvalumab.  Last year he was having significant trouble with fluid retention, both ascites requiring paracentesis on 6 occasions and also leg edema.  He did follow in the wound clinic with improvement in his leg weeping and ulcerations and remains on Bumex.  Still having trouble maintaining a stable weight.  He does not report any obvious angina.  I reviewed his medications.  He had an echocardiogram in September of last year at which point LVEF was 60 to 65% and RV contraction normal. ECG today shows normal sinus rhythm.  I reviewed his recent lab work as well, outlined below.  Recent LDL 71.  Review of Systems: As outlined in the history of present illness.  No palpitations or syncope.  Past Medical History: Past Medical History:  Diagnosis Date   Anxiety    Bladder cancer (HCC) 09/11/2009   CAD (coronary artery disease), native coronary artery    a. Mildly elevated troponin 03/2013, cath with nonobstructive disease including 50% AV groove distal stenosis before large OM   Essential hypertension    Headache(784.0)    History of migraines   Hyperglycemia    Mixed hyperlipidemia    Neuropathy    Obesity    Port-A-Cath in place 09/30/2021   Pre-diabetes    Seasonal allergies    Sleep apnea    On CPAP   Past  Surgical History: Past Surgical History:  Procedure Laterality Date   BIOPSY  09/23/2021   Procedure: BIOPSY;  Surgeon: Dolores Frame, MD;  Location: [facility];  Service: Gastroenterology;;   COLONOSCOPY  06/28/2011   Procedure: COLONOSCOPY;  Surgeon: Malissa Hippo, MD;  Location: [facility];  Service: Endoscopy;  Laterality: N/A;   COLONOSCOPY WITH PROPOFOL N/A 09/23/2021   Procedure: COLONOSCOPY WITH PROPOFOL;  Surgeon: Dolores Frame, MD;  Location: [facility];  Service: Gastroenterology;  Laterality: N/A;  940   ESOPHAGOGASTRODUODENOSCOPY (EGD) WITH PROPOFOL N/A 09/23/2021   Procedure: ESOPHAGOGASTRODUODENOSCOPY (EGD) WITH PROPOFOL;  Surgeon: Dolores Frame, MD;  Location: [facility];  Service: Gastroenterology;  Laterality: N/A;   IR IMAGING GUIDED PORT INSERTION  09/28/2021   IR PARACENTESIS  09/28/2021   JOINT REPLACEMENT Right    hip   LEFT HEART CATHETERIZATION WITH CORONARY ANGIOGRAM N/A 03/31/2013   Procedure: LEFT HEART CATHETERIZATION WITH CORONARY ANGIOGRAM;  Surgeon: [name], MD;  Location: [facility];  Service: Cardiovascular;  Laterality: N/A;   POLYPECTOMY  09/23/2021   Procedure: POLYPECTOMY;  Surgeon: Dolores Frame, MD;  Location: [facility];  Service: Gastroenterology;;   TURBT  09/11/2009   Family History: Family History  Problem Relation Age of Onset   COPD Father    Social History:  Social History   Tobacco Use   Smoking status: Former    Current packs/day: 0.00    Average packs/day: 0.5  packs/day for 10.0 years (5.0 ttl pk-yrs)    Types: Cigarettes    Start date: 07/09/2001    Quit date: 07/10/2011    Years since quitting: 11.7   Smokeless tobacco: Never   Tobacco comments:    Quit several yrs prior to 03/2013.  Substance Use Topics   Alcohol use: Yes    Alcohol/week: 0.0 standard drinks of alcohol    Comment: Occasional   Medications: Current Outpatient Medications on File Prior to  Visit  Medication Sig Dispense Refill   amLODipine (NORVASC) 5 MG tablet Take 5 mg by mouth daily.     aspirin EC 81 MG tablet Take 81 mg by mouth daily.     bumetanide (BUMEX) 2 MG tablet Take 1 tablet (2 mg total) by mouth in the morning. 30 tablet 2   cyclobenzaprine (FLEXERIL) 10 MG tablet Take 10 mg by mouth at bedtime as needed for muscle spasms.     Durvalumab (IMFINZI IV) Inject into the vein every 21 ( twenty-one) days.     Eszopiclone 3 MG TABS Take 3 mg by mouth at bedtime. Take immediately before bedtime     fenofibrate 160 MG tablet Take 160 mg by mouth daily.     gabapentin (NEURONTIN) 300 MG capsule Take 2 capsules (600 mg total) by mouth 4 (four) times daily. 240 capsule 11   magnesium oxide (MAG-OX) 400 (240 Mg) MG tablet Take 1 tablet (400 mg total) by mouth 2 (two) times daily. (Patient taking differently: Take 400 mg by mouth 3 (three) times daily.) 90 tablet 6   metFORMIN (GLUCOPHAGE-XR) 500 MG 24 hr tablet Take 500 mg by mouth 2 (two) times daily.     metolazone (ZAROXOLYN) 5 MG tablet Take 1 tablet (5 mg total) by mouth 3 (three) times a week. 30 tablet 2   nitroGLYCERIN (NITROSTAT) 0.4 MG SL tablet Place 1 tablet (0.4 mg total) under the tongue every 5 (five) minutes as needed for chest pain. 25 tablet 3   olmesartan (BENICAR) 40 MG tablet TAKE (1) TABLET BY MOUTH ONCE DAILY. 30 tablet 5   pantoprazole (PROTONIX) 40 MG tablet Take 1 tablet (40 mg total) by mouth daily. 90 tablet 0   potassium chloride SA (KLOR-CON M) 20 MEQ tablet Take 1 tablet (20 mEq total) by mouth daily. 60 tablet 3   pramipexole (MIRAPEX) 0.75 MG tablet Take 1 tablet (0.75 mg total) by mouth 3 (three) times daily. 90 tablet 0   RYBELSUS 7 MG TABS Take 14 mg by mouth daily.     tamsulosin (FLOMAX) 0.4 MG CAPS capsule Take 0.4 mg by mouth daily.     traMADol (ULTRAM) 50 MG tablet Take 50 mg by mouth 4 (four) times daily as needed for moderate pain.     zaleplon (SONATA) 10 MG capsule Take 1 capsule (10  mg total) by mouth at bedtime as needed for sleep. 30 capsule 5   [DISCONTINUED] prochlorperazine (COMPAZINE) 10 MG tablet Take 1 tablet (10 mg total) by mouth every 6 (six) hours as needed (Nausea or vomiting). 30 tablet 1   Current Facility-Administered Medications on File Prior to Visit  Medication Dose Route Frequency Provider Last Rate Last Admin   magnesium sulfate 2 GM/50ML IVPB            Allergies: No Known Allergies Physical Exam: VS:  BP 130/70   Pulse 76   Ht 6' (1.829 m)   Wt 298 lb 12.8 oz (135.5 kg)   SpO2 92%  BMI 40.52 kg/m , BMI Body mass index is 40.52 kg/m.  Wt Readings from Last 3 Encounters:  03/29/23 298 lb 12.8 oz (135.5 kg)  03/01/23 287 lb 6.4 oz (130.4 kg)  02/22/23 286 lb (129.7 kg)    General: Patient appears comfortable at rest. HEENT: Conjunctiva and lids normal. Neck: Supple, no elevated JVP or carotid bruits. Lungs: Decreased breath sounds without crackles, nonlabored breathing at rest. Cardiac: Regular rate and rhythm, no S3 or significant systolic murmur, no pericardial rub. Abdomen: Protuberant, bowel sounds present, no guarding or rebound. Extremities: Venous stasis changes lower legs. Skin: Warm and dry. Musculoskeletal: No kyphosis. Neuropsychiatric: Alert and oriented x3, affect grossly appropriate.  ECG:  An ECG dated 06/04/2022 was personally reviewed today and demonstrated:  Sinus tachycardia with R' in lead V1 and V2.  Labwork: 03/01/2023: ALT 28; AST 30; BUN 44; Creatinine, Ser 1.23; Hemoglobin 11.6; Magnesium 1.8; Platelets 283; Potassium 3.9; Sodium 136; TSH 2.653  July 2024: Cholesterol 140, triglycerides 225, HDL 32, LDL 71  Other Studies Reviewed Today:  Echocardiogram 06/05/2022:  1. Left ventricular ejection fraction, by estimation, is 60 to 65%. The  left ventricle has normal function. The left ventricle has no regional  wall motion abnormalities. There is mild asymmetric left ventricular  hypertrophy of the septal  segment. Left  ventricular diastolic parameters are indeterminate. Elevated left  ventricular end-diastolic pressure.   2. Right ventricular systolic function is normal. The right ventricular  size is normal. Tricuspid regurgitation signal is inadequate for assessing  PA pressure.   3. Left atrial size was mildly dilated.   4. The mitral valve is grossly normal. Trivial mitral valve  regurgitation.   5. The aortic valve is tricuspid. Aortic valve regurgitation is trivial.  No aortic stenosis is present. Aortic valve mean gradient measures 4.0  mmHg.   6. The inferior vena cava is normal in size with greater than 50%  respiratory variability, suggesting right atrial pressure of 3 mmHg.   Assessment and Plan:  1.  History of nonobstructive CAD by cardiac catheterization in 2014.  Echocardiogram in September of last year revealed LVEF 60 to 65%.  He does not report any obvious angina and remains on aspirin with as needed nitroglycerin available.  He had been on Lipitor previously but therapy interrupted in the setting of other illness.  Most recent LDL 71 in the absence of treatment.  2.  Cholangiocarcinoma with peritoneal carcinomatosis status post treatment with gemcitabine, cisplatin and durvalumab.  He is following with Dr. Ellin Saba.  Has had significant trouble with fluid retention including abdominal ascites requiring several paracenteses last year and also more recently leg edema which has largely improved with treatment in the wound center.  He remains on Bumex.  Plan to update echocardiogram to ensure no interval development of cardiomyopathy.  3.  Essential hypertension, continue Benicar and Norvasc.  Disposition:  Follow up  test results.  Signed, Jonelle Sidle, M.D., F.A.C.C. [facility]

## 2023-03-29 ENCOUNTER — Encounter: Payer: Self-pay | Admitting: Cardiology

## 2023-03-29 ENCOUNTER — Ambulatory Visit: Payer: Medicare Other | Admitting: Hematology

## 2023-03-29 ENCOUNTER — Other Ambulatory Visit: Payer: Medicare Other

## 2023-03-29 ENCOUNTER — Ambulatory Visit: Payer: Medicare Other

## 2023-03-29 ENCOUNTER — Ambulatory Visit: Payer: Medicare Other | Attending: Cardiology | Admitting: Cardiology

## 2023-03-29 VITALS — BP 130/70 | HR 76 | Ht 72.0 in | Wt 298.8 lb

## 2023-03-29 DIAGNOSIS — I10 Essential (primary) hypertension: Secondary | ICD-10-CM | POA: Diagnosis not present

## 2023-03-29 DIAGNOSIS — I2510 Atherosclerotic heart disease of native coronary artery without angina pectoris: Secondary | ICD-10-CM | POA: Insufficient documentation

## 2023-03-29 DIAGNOSIS — R609 Edema, unspecified: Secondary | ICD-10-CM | POA: Diagnosis not present

## 2023-03-29 NOTE — Patient Instructions (Signed)
Medication Instructions:  Your physician recommends that you continue on your current medications as directed. Please refer to the Current Medication list given to you today.  *If you need a refill on your cardiac medications before your next appointment, please call your pharmacy*   Lab Work: None today If you have labs (blood work) drawn today and your tests are completely normal, you will receive your results only by: MyChart Message (if you have MyChart) OR A paper copy in the mail If you have any lab test that is abnormal or we need to change your treatment, we will call you to review the results.   Testing/Procedures: Your physician has requested that you have an echocardiogram. Echocardiography is a painless test that uses sound waves to create images of your heart. It provides your doctor with information about the size and shape of your heart and how well your heart's chambers and valves are working. This procedure takes approximately one hour. There are no restrictions for this procedure. Please do NOT wear cologne, perfume, aftershave, or lotions (deodorant is allowed). Please arrive 15 minutes prior to your appointment time.    Follow-Up: At [facility], you and your health needs are our priority.  As part of our continuing mission to provide you with exceptional heart care, we have created designated Provider Care Teams.  These Care Teams include your primary Cardiologist (physician) and Advanced Practice Providers (APPs -  Physician Assistants and Nurse Practitioners) who all work together to provide you with the care you need, when you need it.  We recommend signing up for the patient portal called "MyChart".  Sign up information is provided on this After Visit Summary.  MyChart is used to connect with patients for Virtual Visits (Telemedicine).  Patients are able to view lab/test results, encounter notes, upcoming appointments, etc.  Non-urgent messages can be sent to  your provider as well.   To learn more about what you can do with MyChart, go to [email or web address].    Your next appointment:   To be determined after echo  Provider:  Dr.McDowell

## 2023-04-03 ENCOUNTER — Inpatient Hospital Stay: Payer: Medicare Other | Attending: Hematology

## 2023-04-03 ENCOUNTER — Inpatient Hospital Stay (HOSPITAL_BASED_OUTPATIENT_CLINIC_OR_DEPARTMENT_OTHER): Payer: Medicare Other | Admitting: Hematology

## 2023-04-03 ENCOUNTER — Inpatient Hospital Stay: Payer: Medicare Other

## 2023-04-03 ENCOUNTER — Ambulatory Visit: Payer: Medicare Other | Admitting: Neurology

## 2023-04-03 ENCOUNTER — Ambulatory Visit: Payer: Medicare Other | Admitting: Hematology

## 2023-04-03 VITALS — BP 115/79 | HR 77 | Temp 97.4°F | Resp 18

## 2023-04-03 DIAGNOSIS — Z8551 Personal history of malignant neoplasm of bladder: Secondary | ICD-10-CM | POA: Insufficient documentation

## 2023-04-03 DIAGNOSIS — Z79620 Long term (current) use of immunosuppressive biologic: Secondary | ICD-10-CM | POA: Diagnosis not present

## 2023-04-03 DIAGNOSIS — G4733 Obstructive sleep apnea (adult) (pediatric): Secondary | ICD-10-CM | POA: Diagnosis not present

## 2023-04-03 DIAGNOSIS — C787 Secondary malignant neoplasm of liver and intrahepatic bile duct: Secondary | ICD-10-CM

## 2023-04-03 DIAGNOSIS — E876 Hypokalemia: Secondary | ICD-10-CM

## 2023-04-03 DIAGNOSIS — Z95828 Presence of other vascular implants and grafts: Secondary | ICD-10-CM

## 2023-04-03 DIAGNOSIS — Z5112 Encounter for antineoplastic immunotherapy: Secondary | ICD-10-CM | POA: Insufficient documentation

## 2023-04-03 DIAGNOSIS — G479 Sleep disorder, unspecified: Secondary | ICD-10-CM

## 2023-04-03 DIAGNOSIS — C221 Intrahepatic bile duct carcinoma: Secondary | ICD-10-CM | POA: Insufficient documentation

## 2023-04-03 DIAGNOSIS — R609 Edema, unspecified: Secondary | ICD-10-CM | POA: Diagnosis not present

## 2023-04-03 DIAGNOSIS — Z87891 Personal history of nicotine dependence: Secondary | ICD-10-CM | POA: Insufficient documentation

## 2023-04-03 DIAGNOSIS — C801 Malignant (primary) neoplasm, unspecified: Secondary | ICD-10-CM

## 2023-04-03 DIAGNOSIS — K669 Disorder of peritoneum, unspecified: Secondary | ICD-10-CM

## 2023-04-03 DIAGNOSIS — G629 Polyneuropathy, unspecified: Secondary | ICD-10-CM | POA: Insufficient documentation

## 2023-04-03 DIAGNOSIS — Z87898 Personal history of other specified conditions: Secondary | ICD-10-CM

## 2023-04-03 DIAGNOSIS — Z8669 Personal history of other diseases of the nervous system and sense organs: Secondary | ICD-10-CM

## 2023-04-03 DIAGNOSIS — E8809 Other disorders of plasma-protein metabolism, not elsewhere classified: Secondary | ICD-10-CM

## 2023-04-03 DIAGNOSIS — G4734 Idiopathic sleep related nonobstructive alveolar hypoventilation: Secondary | ICD-10-CM

## 2023-04-03 DIAGNOSIS — G2581 Restless legs syndrome: Secondary | ICD-10-CM

## 2023-04-03 DIAGNOSIS — C786 Secondary malignant neoplasm of retroperitoneum and peritoneum: Secondary | ICD-10-CM

## 2023-04-03 DIAGNOSIS — A419 Sepsis, unspecified organism: Secondary | ICD-10-CM

## 2023-04-03 DIAGNOSIS — L98499 Non-pressure chronic ulcer of skin of other sites with unspecified severity: Secondary | ICD-10-CM

## 2023-04-03 LAB — COMPREHENSIVE METABOLIC PANEL WITH GFR
ALT: 25 U/L (ref 0–44)
AST: 28 U/L (ref 15–41)
Albumin: 3.5 g/dL (ref 3.5–5.0)
Alkaline Phosphatase: 69 U/L (ref 38–126)
Anion gap: 13 (ref 5–15)
BUN: 89 mg/dL — ABNORMAL HIGH (ref 8–23)
CO2: 30 mmol/L (ref 22–32)
Calcium: 9.1 mg/dL (ref 8.9–10.3)
Chloride: 92 mmol/L — ABNORMAL LOW (ref 98–111)
Creatinine, Ser: 1.86 mg/dL — ABNORMAL HIGH (ref 0.61–1.24)
GFR, Estimated: 39 mL/min — ABNORMAL LOW
Glucose, Bld: 183 mg/dL — ABNORMAL HIGH (ref 70–99)
Potassium: 3.7 mmol/L (ref 3.5–5.1)
Sodium: 135 mmol/L (ref 135–145)
Total Bilirubin: 0.5 mg/dL (ref 0.3–1.2)
Total Protein: 6.7 g/dL (ref 6.5–8.1)

## 2023-04-03 LAB — CBC WITH DIFFERENTIAL/PLATELET
Abs Immature Granulocytes: 0.07 K/uL (ref 0.00–0.07)
Basophils Absolute: 0 K/uL (ref 0.0–0.1)
Basophils Relative: 1 %
Eosinophils Absolute: 0.2 K/uL (ref 0.0–0.5)
Eosinophils Relative: 3 %
HCT: 34.7 % — ABNORMAL LOW (ref 39.0–52.0)
Hemoglobin: 11.7 g/dL — ABNORMAL LOW (ref 13.0–17.0)
Immature Granulocytes: 1 %
Lymphocytes Relative: 18 %
Lymphs Abs: 1.2 K/uL (ref 0.7–4.0)
MCH: 30.1 pg (ref 26.0–34.0)
MCHC: 33.7 g/dL (ref 30.0–36.0)
MCV: 89.2 fL (ref 80.0–100.0)
Monocytes Absolute: 0.8 K/uL (ref 0.1–1.0)
Monocytes Relative: 12 %
Neutro Abs: 4.2 K/uL (ref 1.7–7.7)
Neutrophils Relative %: 65 %
Platelets: 278 K/uL (ref 150–400)
RBC: 3.89 MIL/uL — ABNORMAL LOW (ref 4.22–5.81)
RDW: 13.8 % (ref 11.5–15.5)
WBC: 6.4 K/uL (ref 4.0–10.5)
nRBC: 0 % (ref 0.0–0.2)

## 2023-04-03 LAB — MAGNESIUM: Magnesium: 2.1 mg/dL (ref 1.7–2.4)

## 2023-04-03 LAB — TSH: TSH: 7.396 u[IU]/mL — ABNORMAL HIGH (ref 0.350–4.500)

## 2023-04-03 MED ORDER — METOLAZONE 5 MG PO TABS: 5.0000 mg | ORAL_TABLET | ORAL | 4 refills | Status: DC

## 2023-04-03 MED ORDER — SODIUM CHLORIDE 0.9 % IV SOLN
1500.0000 mg | Freq: Once | INTRAVENOUS | Status: AC
  Administered 2023-04-03: 1500 mg via INTRAVENOUS
  Filled 2023-04-03: qty 30

## 2023-04-03 MED ORDER — SODIUM CHLORIDE 0.9 % IV SOLN: Freq: Once | INTRAVENOUS | Status: AC

## 2023-04-03 MED ORDER — SODIUM CHLORIDE 0.9% FLUSH
10.0000 mL | INTRAVENOUS | Status: DC | PRN
  Administered 2023-04-03: 10 mL

## 2023-04-03 MED ORDER — SODIUM CHLORIDE 0.9% FLUSH
10.0000 mL | INTRAVENOUS | Status: DC | PRN
  Administered 2023-04-03: 10 mL via INTRAVENOUS

## 2023-04-03 MED ORDER — HEPARIN SOD (PORK) LOCK FLUSH 100 UNIT/ML IV SOLN
500.0000 [IU] | Freq: Once | INTRAVENOUS | Status: AC | PRN
  Administered 2023-04-03: 500 [IU]

## 2023-04-03 NOTE — Progress Notes (Signed)
[facility] [address], [place] [address]    Clinic Day:  04/03/2023  Referring physician: Benita Stabile, MD  Patient Care Team: Benita Stabile, MD as PCP - General (Internal Medicine) Jonelle Sidle, MD as PCP - Cardiology (Cardiology) Jonelle Sidle, MD as Consulting Physician (Cardiology) Doreatha Massed, MD as Medical Oncologist (Medical Oncology)   ASSESSMENT & PLAN:   Assessment: 1. Liver lesion/peritoneal carcinomatosis: - Patient seen at the request of Dr. [name] - Reported pressure on the sides of the abdomen with slight pain for the last 1 month.  He also reported pain in the epigastric region. - He had lost 20 pounds in the last 3 to 4 months intentionally, cutting back on sugars.  He was also started on semaglutide. - Colonoscopy on 06/28/2011 with a benign polypoid colonic mucosa in the descending colon.  No evidence of malignancy. - MRI of the brain was negative. - EGD and colonoscopy on 09/23/2018 did not reveal any malignancies. 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  IHC positive for CK7, CDX2 and negative for GATA3.  Findings suggestive of upper GI or pancreaticobiliary primary. - Cycle 1 of gemcitabine, cisplatin and durvalumab started on 10/05/2021. - NGS: No targetable mutations.  PD-L1 (UE454) negative.  MSI-stable.  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   2. Social/family history: - He lives at home by himself.  He records voiceover/narrations. - Non-smoker. - Father died of MDS.  Paternal grandfather had prostate cancer.  Maternal grandfather had cancer.  3.  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 Laverle Patter.  Reportedly received 1 treatment of intravesical chemo and has been on surveillance since then.  Last surveillance visit was in 2020.    Plan: 1. Cholangiocarcinoma with peritoneal carcinomatosis: - He does not report any immunotherapy  related side effects. - Reviewed CT CAP from 03/27/2023: Scattered groundglass opacities throughout the upper lobes and right middle lobe.  Left lower lobe opacities have resolved.  Unchanged hypodense lesion of the liver.  Unchanged mild diffuse peritoneal thickening without nodularity or ascites. - He does not report any cough or shortness of breath.  O2 sats are 95%. - He will proceed with his treatment today. - He has gained about 50 pounds since a year and 2 months ago.  He does not have any fluid retention at this time.  Will check serum cortisol and ACTH levels.  Last TSH is 2.6.  Will follow-up on TSH from today.  Will also follow-up on echocardiogram. - Blood pressure today is 90/49.  He is on amlodipine and olmesartan.  Recommend discontinuing amlodipine.  He will check blood pressure at home.  If systolic blood pressure is less than 100, he will also discontinue olmesartan. - He also reported twitching of fingers which is new.  Will keep a close eye on it.   2. Fluid retention: - He has increased his Bumex to 2 mg twice daily since last Thursday per Dr. Scharlene Gloss recommendation.  He is also continue metolazone 3 times weekly. - Creatinine today has increased to 1.86 with a BUN of 89.  I have recommended him to cut back Bumex to once daily.  3.  Neuropathy/"drawing" of legs: - Continue Mirapex 0.75 mg twice daily.  Continue gabapentin 300 mg 4 times daily.  4.  Sleeping difficulty: - Continue Lunesta as needed.  5.  Hypomagnesemia/hypokalemia: - Continue K-Dur 20 mEq daily.  Continue magnesium 3 times daily.  Both are normal.    Orders Placed This Encounter  Procedures   Magnesium    Standing Status:   Future    Standing Expiration Date:   05/28/2024   TSH    Standing Status:   Future    Standing Expiration Date:   05/29/2024   T4    Standing Status:   Future    Standing Expiration Date:   05/29/2024   CBC with Differential    Standing Status:   Future    Standing Expiration  Date:   05/29/2024   Comprehensive metabolic panel    Standing Status:   Future    Standing Expiration Date:   05/29/2024   Magnesium    Standing Status:   Future    Standing Expiration Date:   06/25/2024   TSH    Standing Status:   Future    Standing Expiration Date:   06/26/2024   T4    Standing Status:   Future    Standing Expiration Date:   06/26/2024   CBC with Differential    Standing Status:   Future    Standing Expiration Date:   06/26/2024   Comprehensive metabolic panel    Standing Status:   Future    Standing Expiration Date:   06/26/2024   Magnesium    Standing Status:   Future    Standing Expiration Date:   07/23/2024   TSH    Standing Status:   Future    Standing Expiration Date:   07/24/2024   T4    Standing Status:   Future    Standing Expiration Date:   07/24/2024   CBC with Differential    Standing Status:   Future    Standing Expiration Date:   07/24/2024   Comprehensive metabolic panel    Standing Status:   Future    Standing Expiration Date:   07/24/2024   Magnesium    Standing Status:   Future    Standing Expiration Date:   08/20/2024   TSH    Standing Status:   Future    Standing Expiration Date:   08/21/2024   T4    Standing Status:   Future    Standing Expiration Date:   08/21/2024   CBC with Differential    Standing Status:   Future    Standing Expiration Date:   08/21/2024   Comprehensive metabolic panel    Standing Status:   Future    Standing Expiration Date:   08/21/2024   Magnesium    Standing Status:   Future    Standing Expiration Date:   09/17/2024   TSH    Standing Status:   Future    Standing Expiration Date:   09/18/2024   T4    Standing Status:   Future    Standing Expiration Date:   09/18/2024   CBC with Differential    Standing Status:   Future    Standing Expiration Date:   09/18/2024   Comprehensive metabolic panel    Standing Status:   Future    Standing Expiration Date:   09/18/2024   Cortisol    Standing Status:    Future    Standing Expiration Date:   04/02/2024   ACTH    Standing Status:   Future    Standing Expiration Date:   04/02/2024      Marvin Carr,acting as a scribe for Doreatha Massed, MD.,have documented all relevant documentation on the behalf of Doreatha Massed, MD,as  directed by  Doreatha Massed, MD while in the presence of Doreatha Massed, MD.  I, Doreatha Massed MD, have reviewed the above documentation for accuracy and completeness, and I agree with the above.    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 male presenting to clinic today for follow up of cholangiocarcinoma and peritoneal carcinomatosis. He was last seen by me on 03/01/23.  He underwent a CT CAP on 7/16 that found: unchanged treated hypodense lesion of hepatic segment IVB adjacent to the gallbladder fossa; unchanged mild, diffuse peritoneal thickening without nodularity. No ascites; significantly increased scattered ground-glass airspace opacity throughout the upper lobes and right middle lobe, with resolution of previously seen opacities in the dependent left  lower lobe. Fluctuant airspace opacities consistent with ongoing multifocal infection or inflammation; hepatomegaly and severe hepatic steatosis.   Today, he states that he is doing well overall. His appetite level is at 100%. His energy level is at 30%.  PAST MEDICAL HISTORY:   Past Medical History: Past Medical History:  Diagnosis Date   Anxiety    Bladder cancer (HCC) 09/11/2009   CAD (coronary artery disease), native coronary artery    a. Mildly elevated troponin 03/2013, cath with nonobstructive disease including 50% AV groove distal stenosis before large OM   Essential hypertension    Headache(784.0)    History of migraines   Hyperglycemia    Mixed hyperlipidemia    Neuropathy    Obesity    Port-A-Cath in place 09/30/2021   Pre-diabetes    Seasonal allergies    Sleep apnea    On CPAP    Surgical History: Past Surgical History:  Procedure Laterality Date   BIOPSY  09/23/2021   Procedure: BIOPSY;  Surgeon: Dolores Frame, MD;  [facility];  Service: Gastroenterology;;   COLONOSCOPY  06/28/2011   Procedure: COLONOSCOPY;  Surgeon: Malissa Hippo, MD;  [facility]: [facility];  Service: Endoscopy;  Laterality: N/A;   COLONOSCOPY WITH PROPOFOL N/A 09/23/2021   Procedure: COLONOSCOPY WITH PROPOFOL;  Surgeon: Dolores Frame, MD;  [facility]: [facility];  Service: Gastroenterology;  Laterality: N/A;  940   ESOPHAGOGASTRODUODENOSCOPY (EGD) WITH PROPOFOL N/A 09/23/2021   Procedure: ESOPHAGOGASTRODUODENOSCOPY (EGD) WITH PROPOFOL;  Surgeon: Dolores Frame, MD;  [facility]: [facility];  Service: Gastroenterology;  Laterality: N/A;   IR IMAGING GUIDED PORT INSERTION  09/28/2021   IR PARACENTESIS  09/28/2021   JOINT REPLACEMENT Right    hip   LEFT HEART CATHETERIZATION WITH CORONARY ANGIOGRAM N/A 03/31/2013   Procedure: LEFT HEART CATHETERIZATION WITH CORONARY ANGIOGRAM;  Surgeon: [name], MD;  Location: [facility] CATH LAB;  Service:  Cardiovascular;  Laterality: N/A;   POLYPECTOMY  09/23/2021   Procedure: POLYPECTOMY;  Surgeon: Dolores Frame, MD;  [facility]: [facility];  Service: Gastroenterology;;   TURBT  09/11/2009    Social History: Social History   Socioeconomic History   Marital status: Divorced    Spouse name: Not on file   Number of children: 0   Years of education: college   Highest education level: Not on file  Occupational History    Employer: SELF-EMPLOYED  Tobacco Use   Smoking status: Former    Current packs/day: 0.00    Average packs/day: 0.5 packs/day for 10.0 years (5.0 ttl pk-yrs)    Types: Cigarettes    Start date: 07/09/2001    Quit date: 07/10/2011    Years since quitting: 11.7   Smokeless tobacco: Never   Tobacco comments:    Quit several yrs prior to 03/2013.  Vaping Use   Vaping status: Never Used  Substance and Sexual Activity   Alcohol use: Yes    Alcohol/week: 0.0 standard drinks of alcohol    Comment: Occasional   Drug use: No   Sexual activity: Not on file  Other Topics Concern   Not on file  Social History Narrative   Not on file   Social Determinants of Health   Financial Resource Strain: Not on file  Food Insecurity: No Food Insecurity (06/05/2022)   Hunger Vital Sign    Worried About Running Out of Food in the Last Year: Never true    Ran Out of Food in the Last Year: Never true  Transportation Needs: No Transportation Needs (06/05/2022)   PRAPARE - Administrator, Civil Service (Medical): No    Lack of Transportation (Non-Medical): No  Physical Activity: Not on file  Stress: Not on file  Social Connections: Not on file  Intimate Partner Violence: Not At Risk (06/05/2022)   Humiliation, Afraid, Rape, and Kick questionnaire    Fear of Current or Ex-Partner: No    Emotionally Abused: No    Physically Abused: No    Sexually Abused: No    Family History: Family History  Problem Relation Age of Onset   COPD Father     Current  Medications:  Current Outpatient Medications:    amLODipine (NORVASC) 5 MG tablet, Take 5 mg by mouth daily., Disp: , Rfl:    aspirin EC 81 MG tablet, Take 81 mg by mouth daily., Disp: , Rfl:    bumetanide (BUMEX) 2 MG tablet, Take 1 tablet (2 mg total) by mouth in the morning., Disp: 30 tablet, Rfl: 2   cyclobenzaprine (FLEXERIL) 10 MG tablet, Take 10 mg by mouth at bedtime as needed for muscle spasms., Disp: , Rfl:    Durvalumab (IMFINZI IV), Inject into the vein every 21 ( twenty-one) days., Disp: , Rfl:    Eszopiclone 3 MG TABS, Take 3 mg by mouth at bedtime. Take immediately before bedtime, Disp: , Rfl:    fenofibrate 160 MG tablet, Take 160 mg by mouth daily., Disp: , Rfl:    gabapentin (NEURONTIN) 300 MG capsule, Take 2 capsules (600 mg total) by mouth 4 (four) times daily., Disp: 240 capsule, Rfl: 11   magnesium oxide (MAG-OX) 400 (240 Mg) MG tablet, Take 1 tablet (400 mg total) by mouth 2 (two) times daily. (Patient taking differently: Take 400 mg by mouth 3 (three) times daily.), Disp: 90 tablet, Rfl: 6   metFORMIN (GLUCOPHAGE-XR) 500 MG 24 hr tablet, Take 500 mg by mouth 2 (  two) times daily., Disp: , Rfl:    olmesartan (BENICAR) 40 MG tablet, TAKE (1) TABLET BY MOUTH ONCE DAILY., Disp: 30 tablet, Rfl: 5   pantoprazole (PROTONIX) 40 MG tablet, Take 1 tablet (40 mg total) by mouth daily., Disp: 90 tablet, Rfl: 0   potassium chloride SA (KLOR-CON M) 20 MEQ tablet, Take 1 tablet (20 mEq total) by mouth daily., Disp: 60 tablet, Rfl: 3   pramipexole (MIRAPEX) 0.75 MG tablet, Take 1 tablet (0.75 mg total) by mouth 3 (three) times daily., Disp: 90 tablet, Rfl: 0   RYBELSUS 7 MG TABS, Take 14 mg by mouth daily., Disp: , Rfl:    tamsulosin (FLOMAX) 0.4 MG CAPS capsule, Take 0.4 mg by mouth daily., Disp: , Rfl:    traMADol (ULTRAM) 50 MG tablet, Take 50 mg by mouth 4 (four) times daily as needed for moderate pain., Disp: , Rfl:    zaleplon (SONATA) 10 MG capsule, Take 1 capsule (10 mg total) by  mouth at bedtime as needed for sleep., Disp: 30 capsule, Rfl: 5   [START ON 04/04/2023] metolazone (ZAROXOLYN) 5 MG tablet, Take 1 tablet (5 mg total) by mouth 3 (three) times a week., Disp: 30 tablet, Rfl: 4   nitroGLYCERIN (NITROSTAT) 0.4 MG SL tablet, Place 1 tablet (0.4 mg total) under the tongue every 5 (five) minutes as needed for chest pain. (Patient not taking: Reported on 04/03/2023), Disp: 25 tablet, Rfl: 3 No current facility-administered medications for this visit.  Facility-Administered Medications Ordered in Other Visits:    magnesium sulfate 2 GM/50ML IVPB, , , ,    sodium chloride flush (NS) 0.9 % injection 10 mL, 10 mL, Intracatheter, PRN, Doreatha Massed, MD, 10 mL at 04/03/23 1606   Allergies: No Known Allergies  REVIEW OF SYSTEMS:   Review of Systems  Constitutional:  Negative for chills, fatigue and fever.  HENT:   Negative for lump/mass, mouth sores, nosebleeds, sore throat and trouble swallowing.   Eyes:  Negative for eye problems.  Respiratory:  Positive for cough and shortness of breath.   Cardiovascular:  Negative for chest pain, leg swelling and palpitations.  Gastrointestinal:  Negative for abdominal pain, constipation, diarrhea, nausea and vomiting.  Genitourinary:  Positive for frequency. Negative for bladder incontinence, difficulty urinating, dysuria, hematuria and nocturia.   Musculoskeletal:  Positive for back pain. Negative for arthralgias, flank pain, myalgias and neck pain.  Skin:  Negative for itching and rash.  Neurological:  Negative for dizziness, headaches and numbness.  Hematological:  Does not bruise/bleed easily.  Psychiatric/Behavioral:  Negative for depression, sleep disturbance and suicidal ideas. The patient is not nervous/anxious.   All other systems reviewed and are negative.    VITALS:   There were no vitals taken for this visit.  Wt Readings from Last 3 Encounters:  04/03/23 (!) 302 lb 14.4 oz (137.4 kg)  03/29/23 298 lb 12.8  oz (135.5 kg)  03/01/23 287 lb 6.4 oz (130.4 kg)    There is no height or weight on file to calculate BMI.  Performance status (ECOG): 1 - Symptomatic but completely ambulatory  PHYSICAL EXAM:   Physical Exam Vitals and nursing note reviewed. Exam conducted with a chaperone present.  Constitutional:      Appearance: Normal appearance.  Cardiovascular:     Rate and Rhythm: Normal rate and regular rhythm.     Pulses: Normal pulses.     Heart sounds: Normal heart sounds.  Pulmonary:     Effort: Pulmonary effort is normal.  Breath sounds: Normal breath sounds.  Abdominal:     Palpations: Abdomen is soft. There is no hepatomegaly, splenomegaly or mass.     Tenderness: There is no abdominal tenderness.  Musculoskeletal:     Right lower leg: No edema.     Left lower leg: No edema.  Lymphadenopathy:     Cervical: No cervical adenopathy.     Right cervical: No superficial, deep or posterior cervical adenopathy.    Left cervical: No superficial, deep or posterior cervical adenopathy.     Upper Body:     Right upper body: No supraclavicular or axillary adenopathy.     Left upper body: No supraclavicular or axillary adenopathy.  Neurological:     General: No focal deficit present.     Mental Status: He is alert and oriented to person, place, and time.  Psychiatric:        Mood and Affect: Mood normal.        Behavior: Behavior normal.     LABS:      Latest Ref Rng & Units 04/03/2023    1:15 PM 03/01/2023   10:10 AM 02/01/2023   11:47 AM  CBC  WBC 4.0 - 10.5 K/uL 6.4  5.1  6.8   Hemoglobin 13.0 - 17.0 g/dL 46.9  62.9  52.8   Hematocrit 39.0 - 52.0 % 34.7  35.2  35.8   Platelets 150 - 400 K/uL 278  283  268       Latest Ref Rng & Units 04/03/2023    1:15 PM 03/01/2023   10:10 AM 02/01/2023   11:47 AM  CMP  Glucose 70 - 99 mg/dL 413  244  010   BUN 8 - 23 mg/dL 89  44  55   Creatinine 0.61 - 1.24 mg/dL 2.72  5.36  6.44   Sodium 135 - 145 mmol/L 135  136  134   Potassium  3.5 - 5.1 mmol/L 3.7  3.9  3.2   Chloride 98 - 111 mmol/L 92  100  92   CO2 22 - 32 mmol/L 30  27  28    Calcium 8.9 - 10.3 mg/dL 9.1  9.2  8.9   Total Protein 6.5 - 8.1 g/dL 6.7  6.5  6.5   Total Bilirubin 0.3 - 1.2 mg/dL 0.5  0.5  0.5   Alkaline Phos 38 - 126 U/L 69  90  86   AST 15 - 41 U/L 28  30  28    ALT 0 - 44 U/L 25  28  23       Lab Results  Component Value Date   CEA1 2.8 10/31/2022   /  CEA  Date Value Ref Range Status  10/31/2022 2.8 0.0 - 4.7 ng/mL Final    Comment:    (NOTE)                             Nonsmokers          <3.9                             Smokers             <5.6 [employer] Electrochemiluminescence Immunoassay (ECLIA) Values obtained with different assay methods or kits cannot be used interchangeably.  Results cannot be interpreted as absolute evidence of the presence or absence of malignant disease. Performed At: [facility] [employer] [address]  [address] [place], [place] [id number] Jolene Schimke MD ZD:[id number]    No results found for: "PSA1" Lab Results  Component Value Date   VZD638 11 10/31/2022   No results found for: "CAN125"  No results found for: "TOTALPROTELP", "ALBUMINELP", "A1GS", "A2GS", "BETS", "BETA2SER", "GAMS", "MSPIKE", "SPEI" Lab Results  Component Value Date   TIBC 428 01/29/2023   TIBC 457 (H) 01/04/2022   FERRITIN 183 01/29/2023   FERRITIN 522 (H) 01/04/2022   IRONPCTSAT 15 01/29/2023   IRONPCTSAT 16 (L) 01/04/2022   Lab Results  Component Value Date   LDH 266 (H) 10/05/2021   LDH 242 (H) 09/01/2021     STUDIES:   CT CHEST ABDOMEN PELVIS W CONTRAST  Result Date: 03/30/2023 CLINICAL DATA:  Metastatic disease evaluation, metastatic cholangiocarcinoma, peritoneal carcinomatosis * Tracking Code: BO * EXAM: CT CHEST, ABDOMEN, AND PELVIS WITH CONTRAST TECHNIQUE: Multidetector CT imaging of the chest, abdomen and pelvis was performed following the standard protocol during bolus administration of intravenous  contrast. RADIATION DOSE REDUCTION: This exam was performed according to the departmental dose-optimization program which includes automated exposure control, adjustment of the mA and/or kV according to patient size and/or use of iterative reconstruction technique. CONTRAST:  OMNIPAQUE IOHEXOL 300 MG/ML  SOLN COMPARISON:  CT chest, 12/27/2022, CT chest abdomen pelvis 11/21/2022 FINDINGS: CT CHEST FINDINGS Cardiovascular: Right chest port catheter. Aortic atherosclerosis. Normal heart size. Left and right coronary artery calcifications. No pericardial effusion. Mediastinum/Nodes: No enlarged mediastinal, hilar, or axillary lymph nodes. Thyroid gland, trachea, and esophagus demonstrate no significant findings. Lungs/Pleura: Significantly increased scattered ground-glass airspace opacity throughout the upper lobes and right middle lobe (series 4, image 68, 87), with resolution of previously seen opacities in the dependent left lower lobe. Unchanged tiny benign nodule of the dependent left lower lobe (series 4, image 105). No pleural effusion or pneumothorax. Musculoskeletal: No chest wall abnormality. No acute osseous findings. CT ABDOMEN PELVIS FINDINGS Hepatobiliary: Unchanged treated hypodense lesion of hepatic segment IVB adjacent to the gallbladder fossa, measuring 1.5 x 0.9 cm (series 2, image 66). Hepatomegaly, maximum coronal span 20.9 cm. Severe hepatic steatosis. Gallstones. Fatty mural stratification of the gallbladder wall. No acute inflammatory findings or biliary ductal dilatation. Pancreas: Unremarkable. No pancreatic ductal dilatation or surrounding inflammatory changes. Spleen: Normal in size without significant abnormality. Adrenals/Urinary Tract: Adrenal glands are unremarkable. Kidneys are normal, without renal calculi, solid lesion, or hydronephrosis. Bladder is unremarkable. Stomach/Bowel: Stomach is within normal limits. Appendix appears normal. No evidence of bowel wall thickening,  distention, or inflammatory changes. Descending and sigmoid diverticulosis. Vascular/Lymphatic: Aortic atherosclerosis. No enlarged abdominal or pelvic lymph nodes. Reproductive: No mass or other abnormality. Other: Fat containing umbilical hernia. Fat containing bilateral inguinal hernias. No ascites. Unchanged mild, diffuse peritoneal thickening without nodularity (series 2, image 80). Musculoskeletal: No acute osseous findings. Lipoma of the left gluteal musculature (series 2, image 120). Status post right hip total arthroplasty. IMPRESSION: 1. Unchanged treated hypodense lesion of hepatic segment IVB adjacent to the gallbladder fossa. 2. Unchanged mild, diffuse peritoneal thickening without nodularity. No ascites. 3. Significantly increased scattered ground-glass airspace opacity throughout the upper lobes and right middle lobe, with resolution of previously seen opacities in the dependent left lower lobe. Fluctuant airspace opacities consistent with ongoing multifocal infection or inflammation. 4. Hepatomegaly and severe hepatic steatosis. 5. Cholelithiasis. 6. Coronary artery disease. Aortic Atherosclerosis (ICD10-I70.0). Electronically Signed   By: Jearld Lesch M.D.   On: 03/30/2023 19:25

## 2023-04-03 NOTE — Progress Notes (Signed)
Patient presents today for chemotherapy/immunotherapy infusion of Infinzi. Patient is in satisfactory condition with no new complaints voiced.  Vital signs are stable, with exception of blood pressure 90/49 upon arrival which Dr Ellin Saba is aware of and adjusted patient's blood pressure medication.  Patient asymptomatic at this time. Labs reviewed by Dr. Ellin Saba during the office visit and all labs are within treatment parameters with exception of creatinine of 1.86. Patient approved for treatment by Dr Ellin Saba. We will proceed with treatment per MD orders.   Patient's blood pressure rechecked prior to treatment-126/56.   Patient tolerated treatment well with no complaints voiced.  Patient left ambulatory in stable condition.  Vital signs stable at discharge.  Follow up as scheduled.

## 2023-04-03 NOTE — Progress Notes (Signed)
Patient has been assessed, vital signs and labs have been reviewed by Dr. Ellin Saba. ANC, Creatinine 1.86, LFTs, and Platelets are within treatment parameters per Dr. Ellin Saba. The patient is good to proceed with treatment at this time.  Primary RN and pharmacy aware.

## 2023-04-03 NOTE — Patient Instructions (Addendum)
[facility]  Discharge Instructions  You were seen and examined today by Dr. Ellin Saba.  Dr. Ellin Saba discussed your most recent lab work and CT scan which revealed that everything looks good and stable except your kidney function has went up slightly.  Cut back the Bumex to once daily since it is affection your kidney function. Hold the amlodipine because your blood pressure is low today 90/49 in clinic. If your blood pressure at home is staying as low as it is today then hold the olmesartan as well.  Follow-up as scheduled in 4 weeks.    Thank you for choosing [facility] to provide your oncology and hematology care.   To afford each patient quality time with our provider, please arrive at least 15 minutes before your scheduled appointment time. You may need to reschedule your appointment if you arrive late (10 or more minutes). Arriving late affects you and other patients whose appointments are after yours.  Also, if you miss three or more appointments without notifying the office, you may be dismissed from the clinic at the provider's discretion.    Again, thank you for choosing [facility].  Our hope is that these requests will decrease the amount of time that you wait before being seen by our physicians.   If you have a lab appointment with the Cancer Center - please note that after April 8th, all labs will be drawn in the cancer center.  You do not have to check in or register with the main entrance as you have in the past but will complete your check-in at the cancer center.            _____________________________________________________________  Should you have questions after your visit to [facility], please contact our office at [phone] and follow the prompts.  Our office hours are 8:00 a.m. to 4:30 p.m. Monday - Thursday and 8:00 a.m. to 2:30 p.m. Friday.  Please note that voicemails left  after 4:00 p.m. may not be returned until the following business day.  We are closed weekends and all major holidays.  You do have access to a nurse 24-7, just call the main number to the clinic [phone] and do not press any options, hold on the line and a nurse will answer the phone.    For prescription refill requests, have your pharmacy contact our office and allow 72 hours.    Masks are no longer required in the cancer centers. If you would like for your care team to wear a mask while they are taking care of you, please let them know. You may have one support person who is at least 68 years old accompany you for your appointments.

## 2023-04-03 NOTE — Progress Notes (Signed)
Patients port flushed without difficulty.  Good blood return noted with no bruising or swelling noted at site.  Patient remains accessed for treatment.  

## 2023-04-03 NOTE — Patient Instructions (Signed)
[facility]  Discharge Instructions: Thank you for choosing [facility] to provide your oncology and hematology care.  If you have a lab appointment with the Cancer Center - please note that after April 8th, 2024, all labs will be drawn in the cancer center.  You do not have to check in or register with the main entrance as you have in the past but will complete your check-in in the cancer center.  Wear comfortable clothing and clothing appropriate for easy access to any Portacath or PICC line.   We strive to give you quality time with your provider. You may need to reschedule your appointment if you arrive late (15 or more minutes).  Arriving late affects you and other patients whose appointments are after yours.  Also, if you miss three or more appointments without notifying the office, you may be dismissed from the clinic at the provider's discretion.      For prescription refill requests, have your pharmacy contact our office and allow 72 hours for refills to be completed.    Today you received the following chemotherapy and/or immunotherapy agents Imfinzi.  Durvalumab Injection What is this medication? DURVALUMAB (dur VAL ue mab) treats some types of cancer. It works by helping your immune system slow or stop the spread of cancer cells. It is a monoclonal antibody. This medicine may be used for other purposes; ask your health care provider or pharmacist if you have questions. COMMON BRAND NAME(S): IMFINZI What should I tell my care team before I take this medication? They need to know if you have any of these conditions: Allogeneic stem cell transplant (uses someone else's stem cells) Autoimmune diseases, such as Crohn disease, ulcerative colitis, lupus History of chest radiation Nervous system problems, such as Guillain-Barre syndrome, myasthenia gravis Organ transplant An unusual or allergic reaction to durvalumab, other medications, foods, dyes, or  preservatives Pregnant or trying to get pregnant Breast-feeding How should I use this medication? This medication is infused into a vein. It is given by your care team in a hospital or clinic setting. A special MedGuide will be given to you before each treatment. Be sure to read this information carefully each time. Talk to your care team about the use of this medication in children. Special care may be needed. Overdosage: If you think you have taken too much of this medicine contact a poison control center or emergency room at once. NOTE: This medicine is only for you. Do not share this medicine with others. What if I miss a dose? Keep appointments for follow-up doses. It is important not to miss your dose. Call your care team if you are unable to keep an appointment. What may interact with this medication? Interactions have not been studied. This list may not describe all possible interactions. Give your health care provider a list of all the medicines, herbs, non-prescription drugs, or dietary supplements you use. Also tell them if you smoke, drink alcohol, or use illegal drugs. Some items may interact with your medicine. What should I watch for while using this medication? Your condition will be monitored carefully while you are receiving this medication. You may need blood work while taking this medication. This medication may cause serious skin reactions. They can happen weeks to months after starting the medication. Contact your care team right away if you notice fevers or flu-like symptoms with a rash. The rash may be red or purple and then turn into blisters or peeling of   the skin. You may also notice a red rash with swelling of the face, lips, or lymph nodes in your neck or under your arms. Tell your care team right away if you have any change in your eyesight. Talk to your care team if you may be pregnant. Serious birth defects can occur if you take this medication during pregnancy and  for 3 months after the last dose. You will need a negative pregnancy test before starting this medication. Contraception is recommended while taking this medication and for 3 months after the last dose. Your care team can help you find the option that works for you. Do not breastfeed while taking this medication and for 3 months after the last dose. What side effects may I notice from receiving this medication? Side effects that you should report to your care team as soon as possible: Allergic reactions--skin rash, itching, hives, swelling of the face, lips, tongue, or throat Dry cough, shortness of breath or trouble breathing Eye pain, redness, irritation, or discharge with blurry or decreased vision Heart muscle inflammation--unusual weakness or fatigue, shortness of breath, chest pain, fast or irregular heartbeat, dizziness, swelling of the ankles, feet, or hands Hormone gland problems--headache, sensitivity to light, unusual weakness or fatigue, dizziness, fast or irregular heartbeat, increased sensitivity to cold or heat, excessive sweating, constipation, hair loss, increased thirst or amount of urine, tremors or shaking, irritability Infusion reactions--chest pain, shortness of breath or trouble breathing, feeling faint or lightheaded Kidney injury (glomerulonephritis)--decrease in the amount of urine, red or dark brown urine, foamy or bubbly urine, swelling of the ankles, hands, or feet Liver injury--right upper belly pain, loss of appetite, nausea, light-colored stool, dark yellow or brown urine, yellowing skin or eyes, unusual weakness or fatigue Pain, tingling, or numbness in the hands or feet, muscle weakness, change in vision, confusion or trouble speaking, loss of balance or coordination, trouble walking, seizures Rash, fever, and swollen lymph nodes Redness, blistering, peeling, or loosening of the skin, including inside the mouth Sudden or severe stomach pain, bloody diarrhea, fever,  nausea, vomiting Side effects that usually do not require medical attention (report these to your care team if they continue or are bothersome): Bone, joint, or muscle pain Diarrhea Fatigue Loss of appetite Nausea Skin rash This list may not describe all possible side effects. Call your doctor for medical advice about side effects. You may report side effects to FDA at 1-800-FDA-1088. Where should I keep my medication? This medication is given in a hospital or clinic. It will not be stored at home. NOTE: This sheet is a summary. It may not cover all possible information. If you have questions about this medicine, talk to your doctor, pharmacist, or health care provider.  2024 Elsevier/Gold Standard (2022-01-10 00:00:00)       To help prevent nausea and vomiting after your treatment, we encourage you to take your nausea medication as directed.  BELOW ARE SYMPTOMS THAT SHOULD BE REPORTED IMMEDIATELY: *FEVER GREATER THAN 100.4 F (38 C) OR HIGHER *CHILLS OR SWEATING *NAUSEA AND VOMITING THAT IS NOT CONTROLLED WITH YOUR NAUSEA MEDICATION *UNUSUAL SHORTNESS OF BREATH *UNUSUAL BRUISING OR BLEEDING *URINARY PROBLEMS (pain or burning when urinating, or frequent urination) *BOWEL PROBLEMS (unusual diarrhea, constipation, pain near the anus) TENDERNESS IN MOUTH AND THROAT WITH OR WITHOUT PRESENCE OF ULCERS (sore throat, sores in mouth, or a toothache) UNUSUAL RASH, SWELLING OR PAIN  UNUSUAL VAGINAL DISCHARGE OR ITCHING   Items with * indicate a potential emergency and should be followed   up as soon as possible or go to the Emergency Department if any problems should occur.  Please show the CHEMOTHERAPY ALERT CARD or IMMUNOTHERAPY ALERT CARD at check-in to the Emergency Department and triage nurse.  Should you have questions after your visit or need to cancel or reschedule your appointment, please contact [facility] [phone]  and follow the prompts.  Office hours are  8:00 a.m. to 4:30 p.m. Monday - Friday. Please note that voicemails left after 4:00 p.m. may not be returned until the following business day.  We are closed weekends and major holidays. You have access to a nurse at all times for urgent questions. Please call the main number to the clinic [phone] and follow the prompts.  For any non-urgent questions, you may also contact your provider using MyChart. We now offer e-Visits for anyone 18 and older to request care online for non-urgent symptoms. For details visit [email or web address].   Also download the MyChart app! Go to the app store, search "MyChart", open the app, select [facility], and log in with your MyChart username and password.   

## 2023-04-04 NOTE — Progress Notes (Signed)
[facility] "Marvin Carr" 67 year old male [birth year]   HOME SLEEP TEST REPORT ( by Watch PAT)   STUDY DATE: 04-05-2023  DOB:   MRN:    ORDERING CLINICIAN: Melvyn Novas, MD / Amy Lomax,Np  REFERRING CLINICIAN: [name], MD  via [name], NP   PCP: [name], MD    CLINICAL INFORMATION/HISTORY: Marvin Carr is a 67 y.o. male and established  patient of [name] , who had presented to her last on 01-29-2023:   Had a study with [name] and no results posted. Apparently no diagnosis of a sleep disorder 10 years ago.  He was once offered a trial of CPAP, but failed (?) no documentation .  An ONO with PCP indicated borderline hypoxic events, oxygen trial failed as well.   He has chronic insomnia and takes [place], as provided by [name], MD.  He is in oncology treatment and there he was prescribed sonata. Insomnia due to chronic pain.  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 This is helped by Mirapex.  He is seen in the wound clinic in [place].   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 FSS at 48/ 63 points.    BMI: 39 kg/m   Neck Circumference: 19"   FINDINGS:   Sleep  Summary:   Total Recording Time (hours, min):     9 hours 41 minutes   Total Sleep Time (hours, min):   7 hours 30 minutes              Percent REM (%):   7.6%                                     Respiratory Indices by AASM criteria:   Calculated pAHI (per hour): 75.7/h this is severe apnea  (!)     . If we follow CMS criteria his AHI is still 62.1/h and very severe.                     REM pAHI:   62.4/h                                              NREM pAHI: 76.7/h  Positional AHI: No data available apparently the chest wall electrode did not transmit.  Neither do I have any data for the sleep position or movement.                                                  Oxygen Saturation Statistics:   O2 Saturation Range (%):    Between a nadir at only 75% of the maximum saturation at 98% with a mean saturation of 90%.  This is a low mean saturation                                   O2 Saturation (minutes) <89%:    62.4 minutes, about 14% of total sleep time and therefore severe. O2 saturation (minutes ) <90%:   119 minutes- these are 2 hours or over 26% of total sleep time in hypoxia. Pulse Rate Statistics:   Pulse Mean (bpm): 70 bpm                Pulse Range:    Between 44 and 125 bpm             IMPRESSION:  This HST cannot detect restless leg syndromes but this referral was meant to rule out that an apnea condition is also present.  This HST confirms the presence of severe sleep apnea following CMS or AASM criteria, associated with clinically significant and critical hypoxia.   RECOMMENDATION: Given this degree of apnea and hypoxia, I would want for this patient to have a in-lab titration study ASAP, he may need CPAP or BiPAP and very likely needs additional oxygen.  I am unable to provide any in lab sleep studies for several months.   The only way I can at least initiate treatment for this patient is to refer him for autotitration CPAP at manufacturer  settings with a mask to be fitted with a follow-up ONO while on CPAP.  This is a suboptimal approach.    [facility] can only supplement oxygen during an in- laboratory titration.   PS: I will ask Dr. [name] if she is comfortable instead referring the patient to the [facility] based sleep lab for TOC.    INTERPRETING PHYSICIAN:   Melvyn Novas, MD

## 2023-04-05 LAB — T4: T4, Total: 5.9 ug/dL (ref 4.5–12.0)

## 2023-04-06 DIAGNOSIS — G4734 Idiopathic sleep related nonobstructive alveolar hypoventilation: Secondary | ICD-10-CM | POA: Insufficient documentation

## 2023-04-06 NOTE — Procedures (Signed)
[facility] "Marvin Carr" 67 year old male 01-Sep-1956   HOME SLEEP TEST REPORT ( by Watch PAT)   STUDY DATE: 04-05-2023  DOB:   MRN:    ORDERING CLINICIAN: Melvyn Novas, MD / Amy Lomax,Np  REFERRING CLINICIAN: [name], MD  via [name], NP   PCP: [name], MD    CLINICAL INFORMATION/HISTORY: Marvin Carr is a 67 y.o. male and established  patient of [name] , who had presented to her last on 01-29-2023:   Had a study with [name] and no results posted. Apparently no diagnosis of a sleep disorder 10 years ago.  He was once offered a trial of CPAP, but failed (?) no documentation .  An ONO with PCP indicated borderline hypoxic events, oxygen trial failed as well.   He has chronic insomnia and takes [place], as provided by [name], MD.  He is in oncology treatment and there he was prescribed sonata. Insomnia due to chronic pain.  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 This is helped by Mirapex.  He is seen in the wound clinic in [place].   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 FSS at 48/ 63 points.    BMI: 39 kg/m   Neck Circumference: 19"   FINDINGS:   Sleep Summary:    Total Recording Time (hours, min):     9 hours 41 minutes   Total Sleep Time (hours, min):   7 hours 30 minutes              Percent REM (%):   7.6%                                     Respiratory Indices by AASM criteria:   Calculated pAHI (per hour): 75.7/h this is severe apnea  (!)     . If we follow CMS criteria his AHI is still 62.1/h and very severe.                     REM pAHI:   62.4/h                                              NREM pAHI: 76.7/h  Positional AHI: No data available apparently the chest wall electrode did not transmit.  Neither do I have any data for the sleep position or movement.                                                  Oxygen Saturation Statistics:   O2 Saturation Range (%):    Between a nadir at only 75% of the maximum saturation at 98% with a mean saturation of 90%.  This is a low mean saturation                                   O2 Saturation (minutes) <89%:    62.4 minutes, about 14% of total sleep time and therefore severe. O2 saturation (minutes ) <90%:   119 minutes- these are 2 hours or over 26% of total sleep time in hypoxia. Pulse Rate Statistics:   Pulse Mean (bpm): 70 bpm                Pulse Range:    Between 44 and 125 bpm             IMPRESSION:  This HST cannot detect restless leg syndromes but this referral was meant to rule out that an apnea condition is also present.  This HST confirms the presence of severe sleep apnea following CMS or AASM criteria, associated with clinically significant and critical hypoxia.   RECOMMENDATION: Given this degree of apnea and hypoxia, I would want for this patient to have a in-lab titration study ASAP, he may need CPAP or BiPAP and very likely needs additional oxygen.  I am unable to provide any in lab sleep studies for several months.   The only way I can at least initiate treatment for this patient is to refer him for autotitration CPAP at manufacturer settings  with a mask to be fitted with a follow-up ONO while on CPAP.  This is a suboptimal approach.    [facility] can only supplement oxygen during an in- laboratory titration.  Requesting TOC to pulmonology for this constellation of hypoxia and apnea.       INTERPRETING PHYSICIAN:   Melvyn Novas, MD

## 2023-04-10 ENCOUNTER — Encounter: Payer: Self-pay | Admitting: Neurology

## 2023-04-11 ENCOUNTER — Other Ambulatory Visit: Payer: Self-pay | Admitting: Neurology

## 2023-04-11 DIAGNOSIS — Z8669 Personal history of other diseases of the nervous system and sense organs: Secondary | ICD-10-CM

## 2023-04-11 DIAGNOSIS — G4733 Obstructive sleep apnea (adult) (pediatric): Secondary | ICD-10-CM

## 2023-04-11 DIAGNOSIS — G2581 Restless legs syndrome: Secondary | ICD-10-CM

## 2023-04-11 DIAGNOSIS — Z87898 Personal history of other specified conditions: Secondary | ICD-10-CM

## 2023-04-23 ENCOUNTER — Other Ambulatory Visit: Payer: Self-pay | Admitting: Neurology

## 2023-04-23 MED ORDER — ALPRAZOLAM 0.5 MG PO TABS: 0.2500 mg | ORAL_TABLET | Freq: Every evening | ORAL | 0 refills | Status: DC | PRN

## 2023-04-25 ENCOUNTER — Ambulatory Visit: Payer: Medicare Other | Admitting: Family Medicine

## 2023-04-25 ENCOUNTER — Ambulatory Visit (INDEPENDENT_AMBULATORY_CARE_PROVIDER_SITE_OTHER): Payer: Medicare Other

## 2023-04-25 DIAGNOSIS — I2510 Atherosclerotic heart disease of native coronary artery without angina pectoris: Secondary | ICD-10-CM | POA: Diagnosis not present

## 2023-04-25 LAB — ECHOCARDIOGRAM COMPLETE
Area-P 1/2: 3.65 cm2
Calc EF: 51 %
S' Lateral: 2.65 cm
Single Plane A2C EF: 52.4 %
Single Plane A4C EF: 52 %

## 2023-04-30 ENCOUNTER — Ambulatory Visit: Payer: Medicare Other | Admitting: Hematology

## 2023-04-30 ENCOUNTER — Ambulatory Visit: Payer: Medicare Other

## 2023-04-30 ENCOUNTER — Other Ambulatory Visit: Payer: Medicare Other

## 2023-05-01 ENCOUNTER — Inpatient Hospital Stay: Payer: Medicare Other | Attending: Hematology | Admitting: Hematology

## 2023-05-01 ENCOUNTER — Inpatient Hospital Stay: Payer: Medicare Other

## 2023-05-01 DIAGNOSIS — Z87891 Personal history of nicotine dependence: Secondary | ICD-10-CM | POA: Diagnosis not present

## 2023-05-01 DIAGNOSIS — Z95828 Presence of other vascular implants and grafts: Secondary | ICD-10-CM

## 2023-05-01 DIAGNOSIS — R0602 Shortness of breath: Secondary | ICD-10-CM | POA: Insufficient documentation

## 2023-05-01 DIAGNOSIS — G4730 Sleep apnea, unspecified: Secondary | ICD-10-CM | POA: Diagnosis not present

## 2023-05-01 DIAGNOSIS — M545 Low back pain, unspecified: Secondary | ICD-10-CM | POA: Diagnosis not present

## 2023-05-01 DIAGNOSIS — E876 Hypokalemia: Secondary | ICD-10-CM | POA: Insufficient documentation

## 2023-05-01 DIAGNOSIS — C221 Intrahepatic bile duct carcinoma: Secondary | ICD-10-CM

## 2023-05-01 DIAGNOSIS — R609 Edema, unspecified: Secondary | ICD-10-CM | POA: Insufficient documentation

## 2023-05-01 DIAGNOSIS — G479 Sleep disorder, unspecified: Secondary | ICD-10-CM | POA: Diagnosis not present

## 2023-05-01 DIAGNOSIS — Z8551 Personal history of malignant neoplasm of bladder: Secondary | ICD-10-CM | POA: Insufficient documentation

## 2023-05-01 DIAGNOSIS — R059 Cough, unspecified: Secondary | ICD-10-CM | POA: Diagnosis not present

## 2023-05-01 DIAGNOSIS — G629 Polyneuropathy, unspecified: Secondary | ICD-10-CM | POA: Insufficient documentation

## 2023-05-01 DIAGNOSIS — Z79899 Other long term (current) drug therapy: Secondary | ICD-10-CM | POA: Diagnosis not present

## 2023-05-01 LAB — CBC WITH DIFFERENTIAL/PLATELET
Abs Immature Granulocytes: 0.11 10*3/uL — ABNORMAL HIGH (ref 0.00–0.07)
Basophils Absolute: 0.1 10*3/uL (ref 0.0–0.1)
Basophils Relative: 1 %
Eosinophils Absolute: 0.2 10*3/uL (ref 0.0–0.5)
Eosinophils Relative: 3 %
HCT: 36.3 % — ABNORMAL LOW (ref 39.0–52.0)
Hemoglobin: 12 g/dL — ABNORMAL LOW (ref 13.0–17.0)
Immature Granulocytes: 2 %
Lymphocytes Relative: 16 %
Lymphs Abs: 1.2 10*3/uL (ref 0.7–4.0)
MCH: 29.7 pg (ref 26.0–34.0)
MCHC: 33.1 g/dL (ref 30.0–36.0)
MCV: 89.9 fL (ref 80.0–100.0)
Monocytes Absolute: 0.8 10*3/uL (ref 0.1–1.0)
Monocytes Relative: 10 %
Neutro Abs: 5 10*3/uL (ref 1.7–7.7)
Neutrophils Relative %: 68 %
Platelets: 289 10*3/uL (ref 150–400)
RBC: 4.04 MIL/uL — ABNORMAL LOW (ref 4.22–5.81)
RDW: 14.2 % (ref 11.5–15.5)
WBC: 7.4 10*3/uL (ref 4.0–10.5)
nRBC: 0 % (ref 0.0–0.2)

## 2023-05-01 LAB — COMPREHENSIVE METABOLIC PANEL WITH GFR
ALT: 31 U/L (ref 0–44)
AST: 26 U/L (ref 15–41)
Albumin: 3.7 g/dL (ref 3.5–5.0)
Alkaline Phosphatase: 72 U/L (ref 38–126)
Anion gap: 10 (ref 5–15)
BUN: 32 mg/dL — ABNORMAL HIGH (ref 8–23)
CO2: 27 mmol/L (ref 22–32)
Calcium: 9.7 mg/dL (ref 8.9–10.3)
Chloride: 100 mmol/L (ref 98–111)
Creatinine, Ser: 1.06 mg/dL (ref 0.61–1.24)
GFR, Estimated: >60 mL/min
Glucose, Bld: 156 mg/dL — ABNORMAL HIGH (ref 70–99)
Potassium: 3.7 mmol/L (ref 3.5–5.1)
Sodium: 137 mmol/L (ref 135–145)
Total Bilirubin: 0.5 mg/dL (ref 0.3–1.2)
Total Protein: 6.9 g/dL (ref 6.5–8.1)

## 2023-05-01 LAB — MAGNESIUM: Magnesium: 1.9 mg/dL (ref 1.7–2.4)

## 2023-05-01 LAB — TSH: TSH: 3.018 u[IU]/mL (ref 0.350–4.500)

## 2023-05-01 MED ORDER — HEPARIN SOD (PORK) LOCK FLUSH 100 UNIT/ML IV SOLN
500.0000 [IU] | Freq: Once | INTRAVENOUS | Status: AC
  Administered 2023-05-01: 500 [IU] via INTRAVENOUS

## 2023-05-01 MED ORDER — METHYLPREDNISOLONE 4 MG PO TBPK: ORAL_TABLET | ORAL | 0 refills | Status: DC

## 2023-05-01 MED ORDER — SODIUM CHLORIDE 0.9% FLUSH
10.0000 mL | INTRAVENOUS | Status: DC | PRN
  Administered 2023-05-01: 10 mL via INTRAVENOUS

## 2023-05-01 NOTE — Progress Notes (Signed)
[facility] [address] [place] [address]    Clinic Day:  05/01/2023  Referring physician: Benita Stabile, MD  Patient Care Team: Benita Stabile, MD as PCP - General (Internal Medicine) Jonelle Sidle, MD as PCP - Cardiology (Cardiology) Jonelle Sidle, MD as Consulting Physician (Cardiology) Doreatha Massed, MD as Medical Oncologist (Medical Oncology)   ASSESSMENT & PLAN:   Assessment: 1. Liver lesion/peritoneal carcinomatosis: - Patient seen at the request of Dr. [name] - Reported pressure on the sides of the abdomen with slight pain for the last 1 month.  He also reported pain in the epigastric region. - He had lost 20 pounds in the last 3 to 4 months intentionally, cutting back on sugars.  He was also started on semaglutide. - Colonoscopy on 06/28/2011 with a benign polypoid colonic mucosa in the descending colon.  No evidence of malignancy. - MRI of the brain was negative. - EGD and colonoscopy on 09/23/2018 did not reveal any malignancies. 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  IHC positive for CK7, CDX2 and negative for GATA3.  Findings suggestive of upper GI or pancreaticobiliary primary. - Cycle 1 of gemcitabine, cisplatin and durvalumab started on 10/05/2021. - NGS: No targetable mutations.  PD-L1 (CZ660) negative.  MSI-stable.  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   2. Social/family history: - He lives at home by himself.  He records voiceover/narrations. - Non-smoker. - Father died of MDS.  Paternal grandfather had prostate cancer.  Maternal grandfather had cancer.  3.  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 Laverle Patter.  Reportedly received 1 treatment of intravesical chemo and has been on surveillance since then.  Last surveillance visit was in 2020.    Plan: 1. Cholangiocarcinoma with peritoneal carcinomatosis: - CT CAP on 03/27/2023: Scattered  groundglass opacities throughout the upper lobes and right middle lobe.  Left lower lobe opacities have resolved.  Unchanged hypodense lesions of the liver.  Unchanged peritoneal thickening and nodularity. - He reported cough has gotten worse since last visit.  It is worse on lying down.  Mostly dry cough.  He was also started on CPAP about 3 nights ago.  Today he feels sleep deprived.  He also reported more shortness of breath. - He reported low back pain on movements which was started about 3 weeks ago.  He thinks he might have pulled a muscle. - Because of worsening cough and shortness of breath, I have recommended holding durvalumab today. - Will give him Medrol Dosepak to be tapered over 7 days. - We will reevaluate him in 2 weeks.   2. Fluid retention: - Continue Bumex 2 mg daily.  Continue metolazone 3 times weekly.  3.  Neuropathy/"drawing" of legs: - Continue Mirapex 0.75 mg twice daily and gabapentin 300 mg 4 times daily.  4.  Sleeping difficulty: - Continue Lunesta as needed.  5.  Hypomagnesemia/hypokalemia: - Continue K-Dur 20 milliequivalents daily.  Continue magnesium 3 times daily.  Both are normal.    No orders of the defined types were placed in this encounter.     Alben Deeds Teague,acting as a Neurosurgeon for Doreatha Massed, MD.,have documented all relevant documentation on the behalf of Doreatha Massed, MD,as directed by  Doreatha Massed, MD while in the presence of Doreatha Massed, MD.  I, Doreatha Massed MD, have reviewed the above documentation for accuracy and completeness, and I agree with the above.     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 male presenting to clinic today for follow up of cholangiocarcinoma and peritoneal carcinomatosis. He was last seen by me on 04/03/23.  Today, he states that he is doing well overall. His appetite level is at 100%. His energy level is at 25%.  He c/o not getting adequate sleep, though last night  he had 4 hours of sleep with CPAP. He notes that was his third time using his CPAP. His inability to sleep has worsened for the last 2-3 weeks, though since receiving his CPAP his sleeping has improved. His dry cough from last visit has worsened, particularly at night and when lying down. He notes increased SOB with and without exertion. He denies any sputum with cough. He notes his voice has become more raspy. His dry cough started before receiving his CPAP.   He c/o dull lower back pain that is on and off, and occasionally becomes sharp pain with certain movements. He denies exerting himself with overbearing weight.   He still has increased fluid retention on his lower extremities and has been using neosporin to treat occasional leaking where the fluid retention is. He is still taking Bumex as prescribed, other than 2 instances of skipping it.  He is not taking amlodipine.  PAST MEDICAL HISTORY:   Past Medical History: Past Medical History:  Diagnosis Date   Anxiety    Bladder cancer (HCC) 09/11/2009   CAD (coronary artery disease), native coronary artery     a. Mildly elevated troponin 03/2013, cath with nonobstructive disease including 50% AV groove distal stenosis before large OM   Essential hypertension    Headache(784.0)    History of migraines   Hyperglycemia    Mixed hyperlipidemia    Neuropathy    Obesity    Port-A-Cath in place 09/30/2021   Pre-diabetes    Seasonal allergies    Sleep apnea    On CPAP    Surgical History: Past Surgical History:  Procedure Laterality Date   BIOPSY  09/23/2021   Procedure: BIOPSY;  Surgeon: Dolores Frame, MD;  [facility];  Service: Gastroenterology;;   COLONOSCOPY  06/28/2011   Procedure: COLONOSCOPY;  Surgeon: Malissa Hippo, MD;  [facility];  Service: Endoscopy;  Laterality: N/A;   COLONOSCOPY WITH PROPOFOL N/A 09/23/2021   Procedure: COLONOSCOPY WITH PROPOFOL;  Surgeon: Dolores Frame, MD;  [facility]: [facility];  Service: Gastroenterology;  Laterality: N/A;  940   ESOPHAGOGASTRODUODENOSCOPY (EGD) WITH PROPOFOL N/A 09/23/2021   Procedure: ESOPHAGOGASTRODUODENOSCOPY (EGD) WITH PROPOFOL;  Surgeon: Dolores Frame, MD;  [facility]: [facility];  Service: Gastroenterology;  Laterality: N/A;  IR IMAGING GUIDED PORT INSERTION  09/28/2021   IR PARACENTESIS  09/28/2021   JOINT REPLACEMENT Right    hip   LEFT HEART CATHETERIZATION WITH CORONARY ANGIOGRAM N/A 03/31/2013   Procedure: LEFT HEART CATHETERIZATION WITH CORONARY ANGIOGRAM;  Surgeon: [name], MD;  Location: [facility] CATH [facility];  Service: Cardiovascular;  Laterality: N/A;   POLYPECTOMY  09/23/2021   Procedure: POLYPECTOMY;  Surgeon: Dolores Frame, MD;  Location: [facility];  Service: Gastroenterology;;   TURBT  09/11/2009    Social History: Social History   Socioeconomic History   Marital status: Divorced    Spouse name: Not on file   Number of children: 0   Years of education: college   Highest education level: Not on file  Occupational History    Employer:  SELF-EMPLOYED  Tobacco Use   Smoking status: Former    Current packs/day: 0.00    Average packs/day: 0.5 packs/day for 10.0 years (5.0 ttl pk-yrs)    Types: Cigarettes    Start date: 07/09/2001    Quit date: 07/10/2011    Years since quitting: 11.8   Smokeless tobacco: Never   Tobacco comments:    Quit several yrs prior to 03/2013.  Vaping Use   Vaping status: Never Used  Substance and Sexual Activity   Alcohol use: Yes    Alcohol/week: 0.0 standard drinks of alcohol    Comment: Occasional   Drug use: No   Sexual activity: Not on file  Other Topics Concern   Not on file  Social History Narrative   Not on file   Social Determinants of Health   Financial Resource Strain: Not on file  Food Insecurity: No Food Insecurity (06/05/2022)   Hunger Vital Sign    Worried About Running Out of Food in the Last Year: Never true    Ran Out of Food in the Last Year: Never true  Transportation Needs: No Transportation Needs (06/05/2022)   PRAPARE - Administrator, Civil Service (Medical): No    Lack of Transportation (Non-Medical): No  Physical Activity: Not on file  Stress: Not on file  Social Connections: Not on file  Intimate Partner Violence: Not At Risk (06/05/2022)   Humiliation, Afraid, Rape, and Kick questionnaire    Fear of Current or Ex-Partner: No    Emotionally Abused: No    Physically Abused: No    Sexually Abused: No    Family History: Family History  Problem Relation Age of Onset   COPD Father     Current Medications:  Current Outpatient Medications:    ALPRAZolam (XANAX) 0.5 MG tablet, Take 0.5-1 tablets (0.25-0.5 mg total) by mouth at bedtime as needed for anxiety., Disp: 14 tablet, Rfl: 0   amLODipine (NORVASC) 5 MG tablet, Take 5 mg by mouth daily., Disp: , Rfl:    aspirin EC 81 MG tablet, Take 81 mg by mouth daily., Disp: , Rfl:    bumetanide (BUMEX) 2 MG tablet, Take 1 tablet (2 mg total) by mouth in the morning., Disp: 30 tablet, Rfl: 2    cyclobenzaprine (FLEXERIL) 10 MG tablet, Take 10 mg by mouth at bedtime as needed for muscle spasms., Disp: , Rfl:    Durvalumab (IMFINZI IV), Inject into the vein every 21 ( twenty-one) days., Disp: , Rfl:    Eszopiclone 3 MG TABS, Take 3 mg by mouth at bedtime. Take immediately before bedtime, Disp: , Rfl:    fenofibrate 160 MG tablet, Take 160 mg by mouth daily., Disp: ,  Rfl:    gabapentin (NEURONTIN) 300 MG capsule, Take 2 capsules (600 mg total) by mouth 4 (four) times daily., Disp: 240 capsule, Rfl: 11   magnesium oxide (MAG-OX) 400 (240 Mg) MG tablet, Take 1 tablet (400 mg total) by mouth 2 (two) times daily. (Patient taking differently: Take 400 mg by mouth 3 (three) times daily.), Disp: 90 tablet, Rfl: 6   metFORMIN (GLUCOPHAGE-XR) 500 MG 24 hr tablet, Take 500 mg by mouth 2 (two) times daily., Disp: , Rfl:    methylPREDNISolone (MEDROL DOSEPAK) 4 MG TBPK tablet, Take as directed., Disp: 1 each, Rfl: 0   metolazone (ZAROXOLYN) 5 MG tablet, Take 1 tablet (5 mg total) by mouth 3 (three) times a week., Disp: 30 tablet, Rfl: 4   olmesartan (BENICAR) 40 MG tablet, TAKE (1) TABLET BY MOUTH ONCE DAILY., Disp: 30 tablet, Rfl: 5   pantoprazole (PROTONIX) 40 MG tablet, Take 1 tablet (40 mg total) by mouth daily., Disp: 90 tablet, Rfl: 0   potassium chloride SA (KLOR-CON M) 20 MEQ tablet, Take 1 tablet (20 mEq total) by mouth daily., Disp: 60 tablet, Rfl: 3   pramipexole (MIRAPEX) 0.75 MG tablet, Take 1 tablet (0.75 mg total) by mouth 3 (three) times daily., Disp: 90 tablet, Rfl: 0   RYBELSUS 7 MG TABS, Take 14 mg by mouth daily., Disp: , Rfl:    tamsulosin (FLOMAX) 0.4 MG CAPS capsule, Take 0.4 mg by mouth daily., Disp: , Rfl:    traMADol (ULTRAM) 50 MG tablet, Take 50 mg by mouth 4 (four) times daily as needed for moderate pain., Disp: , Rfl:    zaleplon (SONATA) 10 MG capsule, Take 1 capsule (10 mg total) by mouth at bedtime as needed for sleep., Disp: 30 capsule, Rfl: 5   nitroGLYCERIN (NITROSTAT)  0.4 MG SL tablet, Place 1 tablet (0.4 mg total) under the tongue every 5 (five) minutes as needed for chest pain. (Patient not taking: Reported on 05/01/2023), Disp: 25 tablet, Rfl: 3 No current facility-administered medications for this visit.  Facility-Administered Medications Ordered in Other Visits:    magnesium sulfate 2 GM/50ML IVPB, , , ,    sodium chloride flush (NS) 0.9 % injection 10 mL, 10 mL, Intravenous, PRN, Doreatha Massed, MD, 10 mL at 05/01/23 1343   Allergies: No Known Allergies  REVIEW OF SYSTEMS:   Review of Systems  Constitutional:  Negative for chills, fatigue and fever.  HENT:   Negative for lump/mass, mouth sores, nosebleeds, sore throat and trouble swallowing.   Eyes:  Negative for eye problems.  Respiratory:  Positive for cough (dry) and shortness of breath.   Cardiovascular:  Positive for palpitations. Negative for chest pain and leg swelling.  Gastrointestinal:  Negative for abdominal pain, constipation, diarrhea, nausea and vomiting.  Genitourinary:  Negative for bladder incontinence, difficulty urinating, dysuria, frequency, hematuria and nocturia.        +urinary urgency  Musculoskeletal:  Positive for back pain (for 3 weeks, 8/10 severity). Negative for arthralgias, flank pain, myalgias and neck pain.  Skin:  Negative for itching and rash.  Neurological:  Positive for dizziness and numbness (in feet). Negative for headaches.  Hematological:  Does not bruise/bleed easily.  Psychiatric/Behavioral:  Positive for sleep disturbance. Negative for depression and suicidal ideas. The patient is not nervous/anxious.   All other systems reviewed and are negative.     VITALS:   There were no vitals taken for this visit.  Wt Readings from Last 3 Encounters:  05/01/23 292 lb 6.4 oz (  132.6 kg)  04/03/23 (!) 302 lb 14.4 oz (137.4 kg)  03/29/23 298 lb 12.8 oz (135.5 kg)    There is no height or weight on file to calculate BMI.  Performance status (ECOG): 1 -  Symptomatic but completely ambulatory  PHYSICAL EXAM:   Physical Exam Vitals and nursing note reviewed. Exam conducted with a chaperone present.  Constitutional:      Appearance: Normal appearance.  Cardiovascular:     Rate and Rhythm: Normal rate and regular rhythm.     Pulses: Normal pulses.     Heart sounds: Normal heart sounds.  Pulmonary:     Effort: Pulmonary effort is normal.     Breath sounds: Normal breath sounds.  Abdominal:     Palpations: Abdomen is soft. There is no hepatomegaly, splenomegaly or mass.     Tenderness: There is no abdominal tenderness.  Musculoskeletal:     Right lower leg: No edema.     Left lower leg: No edema.  Lymphadenopathy:     Cervical: No cervical adenopathy.     Right cervical: No superficial, deep or posterior cervical adenopathy.    Left cervical: No superficial, deep or posterior cervical adenopathy.     Upper Body:     Right upper body: No supraclavicular or axillary adenopathy.     Left upper body: No supraclavicular or axillary adenopathy.  Neurological:     General: No focal deficit present.     Mental Status: He is alert and oriented to person, place, and time.  Psychiatric:        Mood and Affect: Mood normal.        Behavior: Behavior normal.     LABS:      Latest Ref Rng & Units 05/01/2023   12:23 PM 04/03/2023    1:15 PM 03/01/2023   10:10 AM  CBC  WBC 4.0 - 10.5 K/uL 7.4  6.4  5.1   Hemoglobin 13.0 - 17.0 g/dL 16.1  09.6  04.5   Hematocrit 39.0 - 52.0 % 36.3  34.7  35.2   Platelets 150 - 400 K/uL 289  278  283       Latest Ref Rng & Units 05/01/2023   12:23 PM 04/03/2023    1:15 PM 03/01/2023   10:10 AM  CMP  Glucose 70 - 99 mg/dL 409  811  914   BUN 8 - 23 mg/dL 32  89  44   Creatinine 0.61 - 1.24 mg/dL 7.82  9.56  2.13   Sodium 135 - 145 mmol/L 137  135  136   Potassium 3.5 - 5.1 mmol/L 3.7  3.7  3.9   Chloride 98 - 111 mmol/L 100  92  100   CO2 22 - 32 mmol/L 27  30  27    Calcium 8.9 - 10.3 mg/dL 9.7  9.1   9.2   Total Protein 6.5 - 8.1 g/dL 6.9  6.7  6.5   Total Bilirubin 0.3 - 1.2 mg/dL 0.5  0.5  0.5   Alkaline Phos 38 - 126 U/L 72  69  90   AST 15 - 41 U/L 26  28  30    ALT 0 - 44 U/L 31  25  28       Lab Results  Component Value Date   CEA1 2.8 10/31/2022   /  CEA  Date Value Ref Range Status  10/31/2022 2.8 0.0 - 4.7 ng/mL Final    Comment:    (NOTE)  Nonsmokers          <3.9                             Smokers             <5.6 Roche [employer] Electrochemiluminescence Immunoassay (ECLIA) Values obtained with different assay methods or kits cannot be used interchangeably.  Results cannot be interpreted as absolute evidence of the presence or absence of malignant disease. Performed At: [facility] [place] [facility] [address] [place], [place] [id number] Jolene Schimke MD QM:[id number]    No results found for: "PSA1" Lab Results  Component Value Date   MWU132 11 10/31/2022   No results found for: "CAN125"  No results found for: "TOTALPROTELP", "ALBUMINELP", "A1GS", "A2GS", "BETS", "BETA2SER", "GAMS", "MSPIKE", "SPEI" Lab Results  Component Value Date   TIBC 428 01/29/2023   TIBC 457 (H) 01/04/2022   FERRITIN 183 01/29/2023   FERRITIN 522 (H) 01/04/2022   IRONPCTSAT 15 01/29/2023   IRONPCTSAT 16 (L) 01/04/2022   Lab Results  Component Value Date   LDH 266 (H) 10/05/2021   LDH 242 (H) 09/01/2021     STUDIES:   ECHOCARDIOGRAM COMPLETE  Result Date: 04/25/2023    ECHOCARDIOGRAM REPORT   Patient Name:   TRI NOWLING Date of Exam: 04/25/2023 Medical Rec #:  [id number]       Height:       72.0 in Accession #:    [id number]      Weight:       302.9 lb Date of Birth:  [birth year]       BSA:          2.542 m Patient Age:    66 years        BP:           140/70 mmHg Patient Gender: M               HR:           82 bpm. Exam Location:  Outpatient Procedure: 2D Echo, 3D Echo, Color Doppler, Cardiac Doppler and Strain Analysis Indications:    CAD   History:        Patient has prior history of Echocardiogram examinations, most                 recent 06/05/2022. CAD; Risk Factors:Former Smoker, Dyslipidemia                 and Hypertension. 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  Sonographer:    Jeryl Columbia RDCS Referring Phys: [id number] SAMUEL G MCDOWELL IMPRESSIONS  1. Left ventricular ejection fraction, by estimation, is 60 to 65%. The left ventricle has normal function. The left ventricle has no regional wall motion abnormalities. There is mild asymmetric left ventricular hypertrophy of the inferior segment. Left  ventricular diastolic parameters were normal.  2. Right ventricular systolic function is mildly reduced. The right ventricular size is normal. Tricuspid regurgitation signal is inadequate for assessing PA pressure.  3. The mitral valve is normal in structure. No evidence of mitral valve regurgitation. No evidence of mitral stenosis.  4. The aortic valve is normal in structure. Aortic valve regurgitation is not visualized. No aortic stenosis is present.  5. The inferior vena cava is normal in size with greater than 50% respiratory variability, suggesting right atrial pressure of 3 mmHg. FINDINGS  Left Ventricle: Left ventricular ejection fraction, by estimation, is 60 to 65%. The left ventricle has normal  function. The left ventricle has no regional wall motion abnormalities. The left ventricular internal cavity size was normal in size. There is  mild asymmetric left ventricular hypertrophy of the inferior segment. Left ventricular diastolic parameters were normal. Right Ventricle: The right ventricular size is normal. No increase in right ventricular wall thickness. Right ventricular systolic function is mildly reduced. Tricuspid regurgitation signal is inadequate for assessing PA pressure. Left Atrium: Left atrial size was normal in size. Right Atrium: Right atrial size was normal in size. Pericardium: There is no evidence of pericardial effusion.  Presence of epicardial fat layer. Mitral Valve: The mitral valve is normal in structure. No evidence of mitral valve regurgitation. No evidence of mitral valve stenosis. Tricuspid Valve: The tricuspid valve is normal in structure. Tricuspid valve regurgitation is not demonstrated. No evidence of tricuspid stenosis. Aortic Valve: The aortic valve is normal in structure. Aortic valve regurgitation is not visualized. No aortic stenosis is present. Pulmonic Valve: The pulmonic valve was normal in structure. Pulmonic valve regurgitation is trivial. No evidence of pulmonic stenosis. Aorta: The aortic root is normal in size and structure. Venous: The inferior vena cava is normal in size with greater than 50% respiratory variability, suggesting right atrial pressure of 3 mmHg. IAS/Shunts: No atrial level shunt detected by color flow Doppler.  LEFT VENTRICLE PLAX 2D LVIDd:         4.52 cm      Diastology LVIDs:         2.65 cm      LV e' medial:    6.31 cm/s LV PW:         1.18 cm      LV E/e' medial:  14.3 LV IVS:        0.96 cm      LV e' lateral:   8.92 cm/s LVOT diam:     2.00 cm      LV E/e' lateral: 10.1 LV SV:         76 LV SV Index:   30 LVOT Area:     3.14 cm  LV Volumes (MOD) LV vol d, MOD A2C: 123.0 ml LV vol d, MOD A4C: 155.0 ml LV vol s, MOD A2C: 58.5 ml LV vol s, MOD A4C: 74.4 ml LV SV MOD A2C:     64.5 ml LV SV MOD A4C:     155.0 ml LV SV MOD BP:      69.9 ml RIGHT VENTRICLE RV Basal diam:  4.26 cm RV Mid diam:    2.96 cm RV S prime:     14.50 cm/s TAPSE (M-mode): 3.3 cm LEFT ATRIUM             Index        RIGHT ATRIUM           Index LA diam:        4.50 cm 1.77 cm/m   RA Area:     19.70 cm LA Vol (A2C):   53.0 ml 20.85 ml/m  RA Volume:   62.50 ml  24.59 ml/m LA Vol (A4C):   59.2 ml 23.29 ml/m LA Biplane Vol: 58.6 ml 23.06 ml/m  AORTIC VALVE LVOT Vmax:   134.00 cm/s LVOT Vmean:  84.400 cm/s LVOT VTI:    0.243 m  AORTA Ao Root diam: 4.10 cm Ao Asc diam:  3.70 cm MITRAL VALVE MV Area (PHT): 3.65 cm     SHUNTS MV Decel Time: 208 msec    Systemic VTI:  0.24 m  MV E velocity: 90.30 cm/s  Systemic Diam: 2.00 cm MV A velocity: 71.00 cm/s MV E/A ratio:  1.27 Kardie Tobb DO Electronically signed by Thomasene Ripple DO Signature Date/Time: 04/25/2023/3:57:41 PM    Final    Home sleep test  Result Date: 04/03/2023 Melvyn Novas, MD     04/06/2023  4:17 PM [facility] Rufina Falco "[place]" 67 year old male Nov 20, 1955  HOME SLEEP TEST REPORT ( by [facility])  STUDY DATE: 04-05-2023 DOB:  MRN:  ORDERING CLINICIAN: Melvyn Novas, MD / Amy Lomax,Np REFERRING CLINICIAN: [name], MD  via [name], NP PCP: [name], MD  CLINICAL INFORMATION/HISTORY: Isaish Jackovich is a 67 y.o. male and established  patient of [name] , who had presented to her last on 01-29-2023:  Had a study with [name] and no results posted. Apparently no diagnosis of a sleep disorder 10 years ago. He was once offered a trial of CPAP, but failed (?) no documentation . An ONO with PCP indicated borderline hypoxic events, oxygen trial failed as well. He has chronic insomnia and takes [place], as provided by [name], MD. He is in oncology treatment and there he was prescribed sonata. Insomnia due to chronic pain. 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 This is helped by Mirapex. He is seen in the wound clinic in [place]. 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, Peripheral Neuropathy, Chemotherapy induced : Per [name] :             "Under okay control, but has developed augmentation, taking higher dose of Mirapex 0.75 mg 3 times a day,             Advised him to push back Mirapex to 0.75 mg 1 to 2 tablets at nighttime only,             During the day, may take higher dose of gabapentin up to 300 mg 2 tablets 4 times a day as needed             He has developed bilateral feet paresthesia during his chemotherapy for  bile duct carcinoma, continue to progress, not a good candidate for nerve conduction study due to bilateral lower extremity pitting edema, skin wound, " Epworth sleepiness score: 13/24. FSS at 48/ 63 points.  BMI: 39 kg/m  Neck Circumference: 19"  FINDINGS:  Sleep Summary:  Total Recording Time (hours, min):     9 hours 41 minutes  Total Sleep Time (hours, min):   7 hours 30 minutes            Percent REM (%):   7.6%                                   Respiratory Indices by AASM criteria:  Calculated pAHI (per hour): 75.7/h this is severe apnea  (!)     . If we follow CMS criteria his AHI is still 62.1/h and very severe.                   REM pAHI:   62.4/h  NREM pAHI: 76.7/h                           Positional AHI: No data available apparently the chest wall electrode did not transmit.  Neither do I have any data for the sleep position or movement.                                                Oxygen Saturation Statistics:  O2 Saturation Range (%):    Between a nadir at only 75% of the maximum saturation at 98% with a mean saturation of 90%.  This is a low mean saturation                                 O2 Saturation (minutes) <89%:    62.4 minutes, about 14% of total sleep time and therefore severe. O2 saturation (minutes ) <90%:   119 minutes- these are 2 hours or over 26% of total sleep time in hypoxia. Pulse Rate Statistics:  Pulse Mean (bpm): 70 bpm              Pulse Range:    Between 44 and 125 bpm           IMPRESSION:  This HST cannot detect restless leg syndromes but this referral was meant to rule out that an apnea condition is also present.  This HST confirms the presence of severe sleep apnea following CMS or AASM criteria, associated with clinically significant and critical hypoxia.  RECOMMENDATION: Given this degree of apnea and hypoxia, I would want for this patient to have a in-lab titration study ASAP, he may need CPAP or BiPAP and very likely needs  additional oxygen. I am unable to provide any in lab sleep studies for several months.  The only way I can at least initiate treatment for this patient is to refer him for autotitration CPAP at manufacturer settings with a mask to be fitted with a follow-up ONO while on CPAP.  This is a suboptimal approach.  [facility] can only supplement oxygen during an in- laboratory titration. Requesting TOC to pulmonology for this constellation of hypoxia and apnea.  INTERPRETING PHYSICIAN:  Melvyn Novas, MD

## 2023-05-01 NOTE — Progress Notes (Signed)
No treatment today per MD.  

## 2023-05-01 NOTE — Patient Instructions (Addendum)
[facility] at [facility] Discharge Instructions   You were seen and examined today by Dr. Ellin Saba.  He reviewed the results of your lab work which are normal/stable.   We will hold your treatment today. We will send in a steroid dose pack to see if it will help with your cough.       Thank you for choosing [facility] at [facility] to provide your oncology and hematology care.  To afford each patient quality time with our provider, please arrive at least 15 minutes before your scheduled appointment time.   If you have a lab appointment with the Cancer Center please come in thru the Main Entrance and check in at the main information desk.  You need to re-schedule your appointment should you arrive 10 or more minutes late.  We strive to give you quality time with our providers, and arriving late affects you and other patients whose appointments are after yours.  Also, if you no show three or more times for appointments you may be dismissed from the clinic at the providers discretion.     Again, thank you for choosing [facility].  Our hope is that these requests will decrease the amount of time that you wait before being seen by our physicians.       _____________________________________________________________  Should you have questions after your visit to [facility], please contact our office at [phone] and follow the prompts.  Our office hours are 8:00 a.m. and 4:30 p.m. Monday - Friday.  Please note that voicemails left after 4:00 p.m. may not be returned until the following business day.  We are closed weekends and major holidays.  You do have access to a nurse 24-7, just call the main number to the clinic [phone] and do not press any options, hold on the line and a nurse will answer the phone.    For prescription refill requests, have your pharmacy contact our office and allow 72 hours.    Due to  Covid, you will need to wear a mask upon entering the hospital. If you do not have a mask, a mask will be given to you at the Main Entrance upon arrival. For doctor visits, patients may have 1 support person age 67 or older with them. For treatment visits, patients can not have anyone with them due to social distancing guidelines and our immunocompromised population.

## 2023-05-02 LAB — T4: T4, Total: 7.2 ug/dL (ref 4.5–12.0)

## 2023-05-03 ENCOUNTER — Other Ambulatory Visit: Payer: Self-pay | Admitting: Medical Genetics

## 2023-05-03 DIAGNOSIS — Z006 Encounter for examination for normal comparison and control in clinical research program: Secondary | ICD-10-CM

## 2023-05-07 ENCOUNTER — Other Ambulatory Visit: Payer: Self-pay | Admitting: Hematology

## 2023-05-07 DIAGNOSIS — G4700 Insomnia, unspecified: Secondary | ICD-10-CM

## 2023-05-08 ENCOUNTER — Other Ambulatory Visit (HOSPITAL_COMMUNITY): Payer: Medicare Other

## 2023-05-09 ENCOUNTER — Institutional Professional Consult (permissible substitution): Payer: Medicare Other | Admitting: Adult Health

## 2023-05-09 ENCOUNTER — Other Ambulatory Visit: Payer: Self-pay

## 2023-05-09 DIAGNOSIS — C221 Intrahepatic bile duct carcinoma: Secondary | ICD-10-CM

## 2023-05-15 ENCOUNTER — Inpatient Hospital Stay: Payer: Medicare Other

## 2023-05-15 ENCOUNTER — Inpatient Hospital Stay: Payer: Medicare Other | Attending: Hematology

## 2023-05-15 ENCOUNTER — Inpatient Hospital Stay (HOSPITAL_BASED_OUTPATIENT_CLINIC_OR_DEPARTMENT_OTHER): Payer: Medicare Other | Admitting: Hematology

## 2023-05-15 VITALS — BP 121/79 | HR 72 | Temp 96.7°F | Resp 18

## 2023-05-15 DIAGNOSIS — Z87891 Personal history of nicotine dependence: Secondary | ICD-10-CM | POA: Diagnosis not present

## 2023-05-15 DIAGNOSIS — Z8551 Personal history of malignant neoplasm of bladder: Secondary | ICD-10-CM | POA: Insufficient documentation

## 2023-05-15 DIAGNOSIS — E876 Hypokalemia: Secondary | ICD-10-CM | POA: Diagnosis not present

## 2023-05-15 DIAGNOSIS — Z5112 Encounter for antineoplastic immunotherapy: Secondary | ICD-10-CM | POA: Diagnosis present

## 2023-05-15 DIAGNOSIS — R609 Edema, unspecified: Secondary | ICD-10-CM | POA: Diagnosis not present

## 2023-05-15 DIAGNOSIS — G479 Sleep disorder, unspecified: Secondary | ICD-10-CM

## 2023-05-15 DIAGNOSIS — G4730 Sleep apnea, unspecified: Secondary | ICD-10-CM | POA: Diagnosis not present

## 2023-05-15 DIAGNOSIS — C221 Intrahepatic bile duct carcinoma: Secondary | ICD-10-CM | POA: Insufficient documentation

## 2023-05-15 DIAGNOSIS — M545 Low back pain, unspecified: Secondary | ICD-10-CM | POA: Insufficient documentation

## 2023-05-15 DIAGNOSIS — G629 Polyneuropathy, unspecified: Secondary | ICD-10-CM | POA: Insufficient documentation

## 2023-05-15 DIAGNOSIS — Z79899 Other long term (current) drug therapy: Secondary | ICD-10-CM | POA: Insufficient documentation

## 2023-05-15 DIAGNOSIS — Z95828 Presence of other vascular implants and grafts: Secondary | ICD-10-CM

## 2023-05-15 LAB — CBC WITH DIFFERENTIAL/PLATELET
Abs Immature Granulocytes: 0.07 10*3/uL (ref 0.00–0.07)
Basophils Absolute: 0 10*3/uL (ref 0.0–0.1)
Basophils Relative: 1 %
Eosinophils Absolute: 0.2 10*3/uL (ref 0.0–0.5)
Eosinophils Relative: 3 %
HCT: 40 % (ref 39.0–52.0)
Hemoglobin: 13.3 g/dL (ref 13.0–17.0)
Immature Granulocytes: 1 %
Lymphocytes Relative: 22 %
Lymphs Abs: 1.6 10*3/uL (ref 0.7–4.0)
MCH: 29.7 pg (ref 26.0–34.0)
MCHC: 33.3 g/dL (ref 30.0–36.0)
MCV: 89.3 fL (ref 80.0–100.0)
Monocytes Absolute: 1.1 10*3/uL — ABNORMAL HIGH (ref 0.1–1.0)
Monocytes Relative: 14 %
Neutro Abs: 4.3 10*3/uL (ref 1.7–7.7)
Neutrophils Relative %: 59 %
Platelets: 235 10*3/uL (ref 150–400)
RBC: 4.48 MIL/uL (ref 4.22–5.81)
RDW: 14 % (ref 11.5–15.5)
WBC: 7.3 10*3/uL (ref 4.0–10.5)
nRBC: 0 % (ref 0.0–0.2)

## 2023-05-15 LAB — COMPREHENSIVE METABOLIC PANEL
ALT: 38 U/L (ref 0–44)
AST: 31 U/L (ref 15–41)
Albumin: 3.8 g/dL (ref 3.5–5.0)
Alkaline Phosphatase: 84 U/L (ref 38–126)
Anion gap: 14 (ref 5–15)
BUN: 44 mg/dL — ABNORMAL HIGH (ref 8–23)
CO2: 29 mmol/L (ref 22–32)
Calcium: 9.2 mg/dL (ref 8.9–10.3)
Chloride: 89 mmol/L — ABNORMAL LOW (ref 98–111)
Creatinine, Ser: 1.13 mg/dL (ref 0.61–1.24)
GFR, Estimated: >60 mL/min (ref >60)
Glucose, Bld: 164 mg/dL — ABNORMAL HIGH (ref 70–99)
Potassium: 2.7 mmol/L — CL (ref 3.5–5.1)
Sodium: 132 mmol/L — ABNORMAL LOW (ref 135–145)
Total Bilirubin: 0.6 mg/dL (ref 0.3–1.2)
Total Protein: 6.8 g/dL (ref 6.5–8.1)

## 2023-05-15 LAB — MAGNESIUM: Magnesium: 1.9 mg/dL (ref 1.7–2.4)

## 2023-05-15 MED ORDER — POTASSIUM CHLORIDE 10 MEQ/100ML IV SOLN
10.0000 meq | INTRAVENOUS | Status: AC
  Administered 2023-05-15 (×2): 10 meq via INTRAVENOUS
  Filled 2023-05-15 (×2): qty 100

## 2023-05-15 MED ORDER — SODIUM CHLORIDE 0.9 % IV SOLN
1500.0000 mg | Freq: Once | INTRAVENOUS | Status: AC
  Administered 2023-05-15: 1500 mg via INTRAVENOUS
  Filled 2023-05-15: qty 30

## 2023-05-15 MED ORDER — POTASSIUM CHLORIDE CRYS ER 20 MEQ PO TBCR
40.0000 meq | EXTENDED_RELEASE_TABLET | ORAL | Status: AC
  Administered 2023-05-15 (×2): 40 meq via ORAL
  Filled 2023-05-15 (×2): qty 2

## 2023-05-15 MED ORDER — HEPARIN SOD (PORK) LOCK FLUSH 100 UNIT/ML IV SOLN
500.0000 [IU] | Freq: Once | INTRAVENOUS | Status: AC | PRN
  Administered 2023-05-15: 500 [IU]

## 2023-05-15 MED ORDER — SODIUM CHLORIDE 0.9% FLUSH
10.0000 mL | Freq: Once | INTRAVENOUS | Status: AC
  Administered 2023-05-15: 10 mL via INTRAVENOUS

## 2023-05-15 MED ORDER — SODIUM CHLORIDE 0.9% FLUSH
10.0000 mL | INTRAVENOUS | Status: DC | PRN
  Administered 2023-05-15: 10 mL

## 2023-05-15 MED ORDER — SODIUM CHLORIDE 0.9 % IV SOLN: Freq: Once | INTRAVENOUS | Status: AC

## 2023-05-15 NOTE — Progress Notes (Signed)

## 2023-05-15 NOTE — Patient Instructions (Signed)
[facility] [place]  Discharge Instructions: Thank you for choosing [facility] to provide your oncology and hematology care.  If you have a lab appointment with the Cancer Center - please note that after April 8th, 2024, all labs will be drawn in the cancer center.  You do not have to check in or register with the main entrance as you have in the past but will complete your check-in in the cancer center.  Wear comfortable clothing and clothing appropriate for easy access to any Portacath or PICC line.   We strive to give you quality time with your provider. You may need to reschedule your appointment if you arrive late (15 or more minutes).  Arriving late affects you and other patients whose appointments are after yours.  Also, if you miss three or more appointments without notifying the office, you may be dismissed from the clinic at the provider's discretion.      For prescription refill requests, have your pharmacy contact our office and allow 72 hours for refills to be completed.    Today you received the following chemotherapy and/or immunotherapy agents Imfinzi      To help prevent nausea and vomiting after your treatment, we encourage you to take your nausea medication as directed.  BELOW ARE SYMPTOMS THAT SHOULD BE REPORTED IMMEDIATELY: *FEVER GREATER THAN 100.4 F (38 C) OR HIGHER *CHILLS OR SWEATING *NAUSEA AND VOMITING THAT IS NOT CONTROLLED WITH YOUR NAUSEA MEDICATION *UNUSUAL SHORTNESS OF BREATH *UNUSUAL BRUISING OR BLEEDING *URINARY PROBLEMS (pain or burning when urinating, or frequent urination) *BOWEL PROBLEMS (unusual diarrhea, constipation, pain near the anus) TENDERNESS IN MOUTH AND THROAT WITH OR WITHOUT PRESENCE OF ULCERS (sore throat, sores in mouth, or a toothache) UNUSUAL RASH, SWELLING OR PAIN  UNUSUAL VAGINAL DISCHARGE OR ITCHING   Items with * indicate a potential emergency and should be followed up as soon as possible or go to the  Emergency Department if any problems should occur.  Please show the CHEMOTHERAPY ALERT CARD or IMMUNOTHERAPY ALERT CARD at check-in to the Emergency Department and triage nurse.  Should you have questions after your visit or need to cancel or reschedule your appointment, please contact [facility] [phone]  and follow the prompts.  Office hours are 8:00 a.m. to 4:30 p.m. Monday - Friday. Please note that voicemails left after 4:00 p.m. may not be returned until the following business day.  We are closed weekends and major holidays. You have access to a nurse at all times for urgent questions. Please call the main number to the clinic [phone] and follow the prompts.  For any non-urgent questions, you may also contact your provider using MyChart. We now offer e-Visits for anyone 18 and older to request care online for non-urgent symptoms. For details visit [email or web address].   Also download the MyChart app! Go to the app store, search "MyChart", open the app, select [place], and log in with your MyChart username and password.   

## 2023-05-15 NOTE — Progress Notes (Signed)
CRITICAL VALUE ALERT Critical value received:  K+ 2.7 Date of notification:  05-15-23 Time of notification: 1025 Critical value read back:  Yes.   Nurse who received alert:  C. Sesilia Poucher RN MD notified time and response:  Dr. Ellin Saba, will give K+ per orders.

## 2023-05-15 NOTE — Progress Notes (Signed)
Patient's port accessed and labs drawn for treatment. Patient sent back to the waiting room until provider visit.

## 2023-05-15 NOTE — Progress Notes (Signed)
[facility] [address], [place] [address]    Clinic Day:  05/15/2023  Referring physician: Benita Stabile, MD  Patient Care Team: Benita Stabile, MD as PCP - General (Internal Medicine) Jonelle Sidle, MD as PCP - Cardiology (Cardiology) Jonelle Sidle, MD as Consulting Physician (Cardiology) Doreatha Massed, MD as Medical Oncologist (Medical Oncology)   ASSESSMENT & PLAN:   Assessment: 1. Liver lesion/peritoneal carcinomatosis: - Patient seen at the request of Dr. [name] - Reported pressure on the sides of the abdomen with slight pain for the last 1 month.  He also reported pain in the epigastric region. - He had lost 20 pounds in the last 3 to 4 months intentionally, cutting back on sugars.  He was also started on semaglutide. - Colonoscopy on 06/28/2011 with a benign polypoid colonic mucosa in the descending colon.  No evidence of malignancy. - MRI of the brain was negative. - EGD and colonoscopy on 09/23/2018 did not reveal any malignancies. 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  IHC positive for CK7, CDX2 and negative for GATA3.  Findings suggestive of upper GI or pancreaticobiliary primary. - Cycle 1 of gemcitabine, cisplatin and durvalumab started on 10/05/2021. - NGS: No targetable mutations.  PD-L1 (UV253) negative.  MSI-stable.  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   2. Social/family history: - He lives at home by himself.  He records voiceover/narrations. - Non-smoker. - Father died of MDS.  Paternal grandfather had prostate cancer.  Maternal grandfather had cancer.  3.  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 Laverle Patter.  Reportedly received 1 treatment of intravesical chemo and has been on surveillance since then.  Last surveillance visit was in 2020.    Plan: 1. Cholangiocarcinoma with peritoneal carcinomatosis: - CT CAP on 03/27/2023: Scattered  groundglass opacities throughout the upper lobes and right middle lobe.  Left lower lobe opacities have resolved.  Unchanged hypodense lesions of the liver.  Unchanged peritoneal thickening and nodularity. - At last visit I have given him Medrol Dosepak.  Cough has improved. - Labs today: Normal LFTs.  Creatinine is normal.  CBC grossly normal.  TSH is 3.0. - Echocardiogram was 60-65% on 04/25/2023. - He will proceed with the treatment today.  RTC 4 weeks for follow-up.   2. Fluid retention: - Continue Bumex 2 mg daily.  Continue metolazone 3 times weekly.  3.  Neuropathy/"drawing" of legs: - Continue Mirapex 0.75 mg twice daily and gabapentin 300 mg 4 times daily.  4.  Sleeping difficulty: - Continue Lunesta as needed.  5.  Hypomagnesemia/hypokalemia: - Continue potassium 20 mg daily and magnesium 3 times daily.  Potassium today is low at 2.7.  He will receive extra potassium today.  6.  Right-sided lower back pain: - He reported flareup of his right lower back pain for the past few weeks which has been getting worse.  He had this pain for 30 years on and off. - Recommend MRI of the lumbar spine and pelvis.    Orders Placed This Encounter  Procedures   MR PELVIS W WO CONTRAST    Standing Status:   Future    Standing Expiration Date:   05/14/2024    Order Specific Question:   If indicated for the ordered procedure, I authorize the administration of contrast media per Radiology protocol    Answer:   Yes    Order Specific Question:   What is the patient's sedation  requirement?    Answer:   No Sedation    Order Specific Question:   Does the patient have a pacemaker or implanted devices?    Answer:   No    Order Specific Question:   Preferred imaging location?    Answer:   [facility] (table limit [phone])   MR Lumbar Spine W Wo Contrast    Standing Status:   Future    Standing Expiration Date:   05/14/2024    Order Specific Question:   If indicated for the ordered procedure, I  authorize the administration of contrast media per Radiology protocol    Answer:   Yes    Order Specific Question:   What is the patient's sedation requirement?    Answer:   No Sedation    Order Specific Question:   Does the patient have a pacemaker or implanted devices?    Answer:   No    Order Specific Question:   Use SRS Protocol?    Answer:   No    Order Specific Question:   Preferred imaging location?    Answer:   [facility] (table limit - 550lbs)      Alben Deeds Teague,acting as a scribe for Doreatha Massed, MD.,have documented all relevant documentation on the behalf of Doreatha Massed, MD,as directed by  Doreatha Massed, MD while in the presence of Doreatha Massed, MD.  I, Doreatha Massed MD, have reviewed the above documentation for accuracy and completeness, and I agree with the above.      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 male presenting to clinic today for follow up of cholangiocarcinoma and peritoneal carcinomatosis. He was last seen by me on 05/01/23.  Today, he states that he is doing well overall.  His appetite level is at 90%. His energy level is at 30%.  He c/o consistent lower back pain that is unchanged since his last visit. He denies pain radiating to the buttocks. His back pain has been off and on for the last 30 years. He treats back pain with Bengay or alternates heat and ice, that improves pain for a day. He notes that as soon as he makes a certain movement, the pain will resume. He has not seen a back specialist, but he has ben to multiple chiropractors from the late 1980's to 90's. He has had epidurals to the area about 10 years ago. He has not taken potassium this morning, but is other wise taking it as prescribed.   He notes this weekend he had urinary frequency, without any changes in medication or water intake. He notes steroids resolved his  cough. He does report SOB when "tense," particularly when using his CPAP. He is still adjusting to his CPAP. He has been monitoring his O2 saturation, which is around 91-92 when tense.   He reports numbness and tingling in the toes and feet. He denies pain radiating to calves.   PAST MEDICAL HISTORY:   Past Medical History: Past Medical History:  Diagnosis Date   Anxiety    Bladder cancer (HCC) 09/11/2009   CAD (coronary artery disease), native coronary artery    a. Mildly elevated troponin 03/2013, cath with nonobstructive disease including 50% AV groove distal stenosis before large OM   Essential hypertension    Headache(784.0)    History of migraines   Hyperglycemia    Mixed hyperlipidemia    Neuropathy    Obesity    Port-A-Cath in place 09/30/2021   Pre-diabetes    Seasonal allergies    Sleep apnea    On CPAP    Surgical History: Past Surgical History:  Procedure Laterality Date   BIOPSY  09/23/2021   Procedure: BIOPSY;  Surgeon: Dolores Frame, MD;  [facility];  Service: Gastroenterology;;   COLONOSCOPY  06/28/2011   Procedure: COLONOSCOPY;  Surgeon: Malissa Hippo, MD;  [facility]: [facility];   Service: Endoscopy;  Laterality: N/A;   COLONOSCOPY WITH PROPOFOL N/A 09/23/2021   Procedure: COLONOSCOPY WITH PROPOFOL;  Surgeon: Dolores Frame, MD;  [facility]: [facility];  Service: Gastroenterology;  Laterality: N/A;  940   ESOPHAGOGASTRODUODENOSCOPY (EGD) WITH PROPOFOL N/A 09/23/2021   Procedure: ESOPHAGOGASTRODUODENOSCOPY (EGD) WITH PROPOFOL;  Surgeon: Dolores Frame, MD;  Location: [facility];  Service: Gastroenterology;  Laterality: N/A;   IR IMAGING GUIDED PORT INSERTION  09/28/2021   IR PARACENTESIS  09/28/2021   JOINT REPLACEMENT Right    hip   LEFT HEART CATHETERIZATION WITH CORONARY ANGIOGRAM N/A 03/31/2013   Procedure: LEFT HEART CATHETERIZATION WITH CORONARY ANGIOGRAM;  Surgeon: [name], MD;  Location: [facility] CATH LAB;  Service: Cardiovascular;  Laterality: N/A;   POLYPECTOMY  09/23/2021   Procedure: POLYPECTOMY;  Surgeon: Dolores Frame, MD;  Location: [facility];  Service: Gastroenterology;;   TURBT  09/11/2009    Social History: Social History   Socioeconomic History   Marital status: Divorced    Spouse name: Not on file   Number of children: 0   Years of education: college   Highest education level: Not on file  Occupational History    Employer: SELF-EMPLOYED  Tobacco Use   Smoking status: Former    Current packs/day: 0.00    Average packs/day: 0.5 packs/day for 10.0 years (5.0 ttl pk-yrs)    Types: Cigarettes    Start date: 07/09/2001    Quit date: 07/10/2011    Years since quitting: 11.8   Smokeless tobacco: Never   Tobacco comments:    Quit several yrs prior to 03/2013.  Vaping Use   Vaping status: Never Used  Substance and Sexual Activity   Alcohol use: Yes    Alcohol/week: 0.0 standard drinks of alcohol    Comment: Occasional   Drug use: No   Sexual activity: Not on file  Other Topics Concern   Not on file  Social History Narrative   Not on file   Social Determinants of Health   Financial Resource  Strain: Not on file  Food Insecurity: No Food Insecurity (06/05/2022)   Hunger Vital Sign    Worried About Running Out of Food in the Last Year: Never true  Ran Out of Food in the Last Year: Never true  Transportation Needs: No Transportation Needs (06/05/2022)   PRAPARE - Administrator, Civil Service (Medical): No    Lack of Transportation (Non-Medical): No  Physical Activity: Not on file  Stress: Not on file  Social Connections: Not on file  Intimate Partner Violence: Not At Risk (06/05/2022)   Humiliation, Afraid, Rape, and Kick questionnaire    Fear of Current or Ex-Partner: No    Emotionally Abused: No    Physically Abused: No    Sexually Abused: No    Family History: Family History  Problem Relation Age of Onset   COPD Father     Current Medications:  Current Outpatient Medications:    ALPRAZolam (XANAX) 0.5 MG tablet, Take 0.5-1 tablets (0.25-0.5 mg total) by mouth at bedtime as needed for anxiety., Disp: 14 tablet, Rfl: 0   amLODipine (NORVASC) 5 MG tablet, Take 5 mg by mouth daily., Disp: , Rfl:    aspirin EC 81 MG tablet, Take 81 mg by mouth daily., Disp: , Rfl:    bumetanide (BUMEX) 2 MG tablet, Take 1 tablet (2 mg total) by mouth in the morning., Disp: 30 tablet, Rfl: 2   cyclobenzaprine (FLEXERIL) 10 MG tablet, Take 10 mg by mouth at bedtime as needed for muscle spasms., Disp: , Rfl:    Durvalumab (IMFINZI IV), Inject into the vein every 21 ( twenty-one) days., Disp: , Rfl:    Eszopiclone 3 MG TABS, Take 3 mg by mouth at bedtime. Take immediately before bedtime, Disp: , Rfl:    fenofibrate 160 MG tablet, Take 160 mg by mouth daily., Disp: , Rfl:    gabapentin (NEURONTIN) 300 MG capsule, Take 2 capsules (600 mg total) by mouth 4 (four) times daily., Disp: 240 capsule, Rfl: 11   magnesium oxide (MAG-OX) 400 (240 Mg) MG tablet, Take 1 tablet (400 mg total) by mouth 2 (two) times daily. (Patient taking differently: Take 400 mg by mouth 3 (three) times  daily.), Disp: 90 tablet, Rfl: 6   metFORMIN (GLUCOPHAGE-XR) 500 MG 24 hr tablet, Take 500 mg by mouth 2 (two) times daily., Disp: , Rfl:    methylPREDNISolone (MEDROL DOSEPAK) 4 MG TBPK tablet, Take as directed., Disp: 1 each, Rfl: 0   metolazone (ZAROXOLYN) 5 MG tablet, Take 1 tablet (5 mg total) by mouth 3 (three) times a week., Disp: 30 tablet, Rfl: 4   nitroGLYCERIN (NITROSTAT) 0.4 MG SL tablet, Place 1 tablet (0.4 mg total) under the tongue every 5 (five) minutes as needed for chest pain., Disp: 25 tablet, Rfl: 3   olmesartan (BENICAR) 40 MG tablet, TAKE (1) TABLET BY MOUTH ONCE DAILY., Disp: 30 tablet, Rfl: 5   pantoprazole (PROTONIX) 40 MG tablet, Take 1 tablet (40 mg total) by mouth daily., Disp: 90 tablet, Rfl: 0   potassium chloride SA (KLOR-CON M) 20 MEQ tablet, Take 1 tablet (20 mEq total) by mouth daily., Disp: 60 tablet, Rfl: 3   pramipexole (MIRAPEX) 0.75 MG tablet, Take 1 tablet (0.75 mg total) by mouth 3 (three) times daily., Disp: 90 tablet, Rfl: 0   RYBELSUS 7 MG TABS, Take 14 mg by mouth daily., Disp: , Rfl:    tamsulosin (FLOMAX) 0.4 MG CAPS capsule, Take 0.4 mg by mouth daily., Disp: , Rfl:    traMADol (ULTRAM) 50 MG tablet, Take 50 mg by mouth 4 (four) times daily as needed for moderate pain., Disp: , Rfl:    zaleplon (SONATA) 10 MG capsule,  Take 1 capsule (10 mg total) by mouth at bedtime as needed for sleep., Disp: 30 capsule, Rfl: 5 No current facility-administered medications for this visit.  Facility-Administered Medications Ordered in Other Visits:    magnesium sulfate 2 GM/50ML IVPB, , , ,    sodium chloride flush (NS) 0.9 % injection 10 mL, 10 mL, Intracatheter, PRN, Doreatha Massed, MD, 10 mL at 05/15/23 1545   Allergies: No Known Allergies  REVIEW OF SYSTEMS:   Review of Systems  Constitutional:  Negative for chills, fatigue and fever.  HENT:   Negative for lump/mass, mouth sores, nosebleeds, sore throat and trouble swallowing.   Eyes:  Negative for  eye problems.  Respiratory:  Negative for cough and shortness of breath.   Cardiovascular:  Negative for chest pain, leg swelling and palpitations.  Gastrointestinal:  Negative for abdominal pain, constipation, diarrhea, nausea and vomiting.  Genitourinary:  Negative for bladder incontinence, difficulty urinating, dysuria, frequency, hematuria and nocturia.   Musculoskeletal:  Positive for back pain (in right lower back, 9/10 severity). Negative for arthralgias, flank pain, myalgias and neck pain.  Skin:  Negative for itching and rash.  Neurological:  Positive for numbness (in toes and feet, soles of feet feel swollen). Negative for dizziness and headaches.  Hematological:  Does not bruise/bleed easily.  Psychiatric/Behavioral:  Positive for sleep disturbance. Negative for depression and suicidal ideas. The patient is not nervous/anxious.   All other systems reviewed and are negative.     VITALS:   There were no vitals taken for this visit.  Wt Readings from Last 3 Encounters:  05/01/23 292 lb 6.4 oz (132.6 kg)  04/03/23 (!) 302 lb 14.4 oz (137.4 kg)  03/29/23 298 lb 12.8 oz (135.5 kg)    There is no height or weight on file to calculate BMI.  Performance status (ECOG): 1 - Symptomatic but completely ambulatory  PHYSICAL EXAM:   Physical Exam Vitals and nursing note reviewed. Exam conducted with a chaperone present.  Constitutional:      Appearance: Normal appearance.  Cardiovascular:     Rate and Rhythm: Normal rate and regular rhythm.     Pulses: Normal pulses.     Heart sounds: Normal heart sounds.  Pulmonary:     Effort: Pulmonary effort is normal.     Breath sounds: Normal breath sounds.  Abdominal:     Palpations: Abdomen is soft. There is no hepatomegaly, splenomegaly or mass.     Tenderness: There is no abdominal tenderness.  Musculoskeletal:     Right lower leg: No edema.     Left lower leg: No edema.     Comments: +right lower back with tender point   Lymphadenopathy:     Cervical: No cervical adenopathy.     Right cervical: No superficial, deep or posterior cervical adenopathy.    Left cervical: No superficial, deep or posterior cervical adenopathy.     Upper Body:     Right upper body: No supraclavicular or axillary adenopathy.     Left upper body: No supraclavicular or axillary adenopathy.  Neurological:     General: No focal deficit present.     Mental Status: He is alert and oriented to person, place, and time.  Psychiatric:        Mood and Affect: Mood normal.        Behavior: Behavior normal.     LABS:      Latest Ref Rng & Units 05/15/2023    9:35 AM 05/01/2023   12:23 PM 04/03/2023  1:15 PM  CBC  WBC 4.0 - 10.5 K/uL 7.3  7.4  6.4   Hemoglobin 13.0 - 17.0 g/dL 02.7  25.3  66.4   Hematocrit 39.0 - 52.0 % 40.0  36.3  34.7   Platelets 150 - 400 K/uL 235  289  278       Latest Ref Rng & Units 05/15/2023    9:35 AM 05/01/2023   12:23 PM 04/03/2023    1:15 PM  CMP  Glucose 70 - 99 mg/dL 403  474  259   BUN 8 - 23 mg/dL 44  32  89   Creatinine 0.61 - 1.24 mg/dL 5.63  8.75  6.43   Sodium 135 - 145 mmol/L 132  137  135   Potassium 3.5 - 5.1 mmol/L 2.7  3.7  3.7   Chloride 98 - 111 mmol/L 89  100  92   CO2 22 - 32 mmol/L 29  27  30    Calcium 8.9 - 10.3 mg/dL 9.2  9.7  9.1   Total Protein 6.5 - 8.1 g/dL 6.8  6.9  6.7   Total Bilirubin 0.3 - 1.2 mg/dL 0.6  0.5  0.5   Alkaline Phos 38 - 126 U/L 84  72  69   AST 15 - 41 U/L 31  26  28    ALT 0 - 44 U/L 38  31  25      Lab Results  Component Value Date   CEA1 2.8 10/31/2022   /  CEA  Date Value Ref Range Status  10/31/2022 2.8 0.0 - 4.7 ng/mL Final    Comment:    (NOTE)                             Nonsmokers          <3.9                             Smokers             <5.6 [employer] Electrochemiluminescence Immunoassay (ECLIA) Values obtained with different assay methods or kits cannot be used interchangeably.  Results cannot be interpreted as  absolute evidence of the presence or absence of malignant disease. Performed At: [facility] [address] [place], [place] [id number] Jolene Schimke MD YS:[id number]    No results found for: "PSA1" Lab Results  Component Value Date   NAT557 11 10/31/2022   No results found for: "CAN125"  No results found for: "TOTALPROTELP", "ALBUMINELP", "A1GS", "A2GS", "BETS", "BETA2SER", "GAMS", "MSPIKE", "SPEI" Lab Results  Component Value Date   TIBC 428 01/29/2023   TIBC 457 (H) 01/04/2022   FERRITIN 183 01/29/2023   FERRITIN 522 (H) 01/04/2022   IRONPCTSAT 15 01/29/2023   IRONPCTSAT 16 (L) 01/04/2022   Lab Results  Component Value Date   LDH 266 (H) 10/05/2021   LDH 242 (H) 09/01/2021     STUDIES:   ECHOCARDIOGRAM COMPLETE  Result Date: 04/25/2023    ECHOCARDIOGRAM REPORT   Patient Name:   SUNDIATA CUCCHI Date of Exam: 04/25/2023 Medical Rec #:  [id number]       Height:       72.0 in Accession #:    [id number]      Weight:       302.9 lb Date of Birth:  May 10, 1956       BSA:  2.542 m Patient Age:    64 years        BP:           140/70 mmHg Patient Gender: M               HR:           82 bpm. Exam Location:  Outpatient Procedure: 2D Echo, 3D Echo, Color Doppler, Cardiac Doppler and Strain Analysis Indications:    CAD  History:        Patient has prior history of Echocardiogram examinations, most                 recent 06/05/2022. CAD; Risk Factors:Former Smoker, Dyslipidemia                 and Hypertension. 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  Sonographer:    Jeryl Columbia RDCS Referring Phys: [id number] SAMUEL G MCDOWELL IMPRESSIONS  1. Left ventricular ejection fraction, by estimation, is 60 to 65%. The left ventricle has normal function. The left ventricle has no regional wall motion abnormalities. There is mild asymmetric left ventricular hypertrophy of the inferior segment. Left  ventricular diastolic parameters were normal.  2. Right ventricular systolic function is  mildly reduced. The right ventricular size is normal. Tricuspid regurgitation signal is inadequate for assessing PA pressure.  3. The mitral valve is normal in structure. No evidence of mitral valve regurgitation. No evidence of mitral stenosis.  4. The aortic valve is normal in structure. Aortic valve regurgitation is not visualized. No aortic stenosis is present.  5. The inferior vena cava is normal in size with greater than 50% respiratory variability, suggesting right atrial pressure of 3 mmHg. FINDINGS  Left Ventricle: Left ventricular ejection fraction, by estimation, is 60 to 65%. The left ventricle has normal function. The left ventricle has no regional wall motion abnormalities. The left ventricular internal cavity size was normal in size. There is  mild asymmetric left ventricular hypertrophy of the inferior segment. Left ventricular diastolic parameters were normal. Right Ventricle: The right ventricular size is normal. No increase in right ventricular wall thickness. Right ventricular systolic function is mildly reduced. Tricuspid regurgitation signal is inadequate for assessing PA pressure. Left Atrium: Left atrial size was normal in size. Right Atrium: Right atrial size was normal in size. Pericardium: There is no evidence of pericardial effusion. Presence of epicardial fat layer. Mitral Valve: The mitral valve is normal in structure. No evidence of mitral valve regurgitation. No evidence of mitral valve stenosis. Tricuspid Valve: The tricuspid valve is normal in structure. Tricuspid valve regurgitation is not demonstrated. No evidence of tricuspid stenosis. Aortic Valve: The aortic valve is normal in structure. Aortic valve regurgitation is not visualized. No aortic stenosis is present. Pulmonic Valve: The pulmonic valve was normal in structure. Pulmonic valve regurgitation is trivial. No evidence of pulmonic stenosis. Aorta: The aortic root is normal in size and structure. Venous: The inferior vena  cava is normal in size with greater than 50% respiratory variability, suggesting right atrial pressure of 3 mmHg. IAS/Shunts: No atrial level shunt detected by color flow Doppler.  LEFT VENTRICLE PLAX 2D LVIDd:         4.52 cm      Diastology LVIDs:         2.65 cm      LV e' medial:    6.31 cm/s LV PW:         1.18 cm      LV E/e' medial:  14.3  LV IVS:        0.96 cm      LV e' lateral:   8.92 cm/s LVOT diam:     2.00 cm      LV E/e' lateral: 10.1 LV SV:         76 LV SV Index:   30 LVOT Area:     3.14 cm  LV Volumes (MOD) LV vol d, MOD A2C: 123.0 ml LV vol d, MOD A4C: 155.0 ml LV vol s, MOD A2C: 58.5 ml LV vol s, MOD A4C: 74.4 ml LV SV MOD A2C:     64.5 ml LV SV MOD A4C:     155.0 ml LV SV MOD BP:      69.9 ml RIGHT VENTRICLE RV Basal diam:  4.26 cm RV Mid diam:    2.96 cm RV S prime:     14.50 cm/s TAPSE (M-mode): 3.3 cm LEFT ATRIUM             Index        RIGHT ATRIUM           Index LA diam:        4.50 cm 1.77 cm/m   RA Area:     19.70 cm LA Vol (A2C):   53.0 ml 20.85 ml/m  RA Volume:   62.50 ml  24.59 ml/m LA Vol (A4C):   59.2 ml 23.29 ml/m LA Biplane Vol: 58.6 ml 23.06 ml/m  AORTIC VALVE LVOT Vmax:   134.00 cm/s LVOT Vmean:  84.400 cm/s LVOT VTI:    0.243 m  AORTA Ao Root diam: 4.10 cm Ao Asc diam:  3.70 cm MITRAL VALVE MV Area (PHT): 3.65 cm    SHUNTS MV Decel Time: 208 msec    Systemic VTI:  0.24 m MV E velocity: 90.30 cm/s  Systemic Diam: 2.00 cm MV A velocity: 71.00 cm/s MV E/A ratio:  1.27 Kardie Tobb DO Electronically signed by Thomasene Ripple DO Signature Date/Time: 04/25/2023/3:57:41 PM    Final

## 2023-05-15 NOTE — Patient Instructions (Signed)
[facility] at [facility] Discharge Instructions   You were seen and examined today by Dr. Ellin Saba.  He reviewed the results of your lab work which are mostly normal/stable. Your potassium is very low at 2.7. We will give you potassium in the clinic today.   We will proceed with your treatment today.   Return as scheduled.    Thank you for choosing [facility] at [facility] to provide your oncology and hematology care.  To afford each patient quality time with our provider, please arrive at least 15 minutes before your scheduled appointment time.   If you have a lab appointment with the Cancer Center please come in thru the Main Entrance and check in at the main information desk.  You need to re-schedule your appointment should you arrive 10 or more minutes late.  We strive to give you quality time with our providers, and arriving late affects you and other patients whose appointments are after yours.  Also, if you no show three or more times for appointments you may be dismissed from the clinic at the providers discretion.     Again, thank you for choosing [facility].  Our hope is that these requests will decrease the amount of time that you wait before being seen by our physicians.       _____________________________________________________________  Should you have questions after your visit to [facility], please contact our office at [phone] and follow the prompts.  Our office hours are 8:00 a.m. and 4:30 p.m. Monday - Friday.  Please note that voicemails left after 4:00 p.m. may not be returned until the following business day.  We are closed weekends and major holidays.  You do have access to a nurse 24-7, just call the main number to the clinic [phone] and do not press any options, hold on the line and a nurse will answer the phone.    For prescription refill requests, have your pharmacy contact our  office and allow 72 hours.    Due to Covid, you will need to wear a mask upon entering the hospital. If you do not have a mask, a mask will be given to you at the Main Entrance upon arrival. For doctor visits, patients may have 1 support person age 52 or older with them. For treatment visits, patients can not have anyone with them due to social distancing guidelines and our immunocompromised population.

## 2023-05-16 ENCOUNTER — Ambulatory Visit: Payer: Medicare Other

## 2023-05-17 ENCOUNTER — Encounter: Payer: Self-pay | Admitting: Family Medicine

## 2023-05-21 ENCOUNTER — Ambulatory Visit (INDEPENDENT_AMBULATORY_CARE_PROVIDER_SITE_OTHER): Payer: Medicare Other | Admitting: Family Medicine

## 2023-05-21 ENCOUNTER — Encounter: Payer: Self-pay | Admitting: Family Medicine

## 2023-05-21 VITALS — BP 136/74 | HR 85 | Ht 72.0 in | Wt 291.5 lb

## 2023-05-21 DIAGNOSIS — C801 Malignant (primary) neoplasm, unspecified: Secondary | ICD-10-CM | POA: Diagnosis not present

## 2023-05-21 DIAGNOSIS — G2581 Restless legs syndrome: Secondary | ICD-10-CM | POA: Diagnosis not present

## 2023-05-21 DIAGNOSIS — Z87898 Personal history of other specified conditions: Secondary | ICD-10-CM

## 2023-05-21 DIAGNOSIS — G4733 Obstructive sleep apnea (adult) (pediatric): Secondary | ICD-10-CM

## 2023-05-21 DIAGNOSIS — G63 Polyneuropathy in diseases classified elsewhere: Secondary | ICD-10-CM

## 2023-05-21 NOTE — Patient Instructions (Signed)
Please continue using your CPAP regularly. While your insurance requires that you use CPAP at least 4 hours each night on 70% of the nights, I recommend, that you not skip any nights and use it throughout the night if you can. Getting used to CPAP and staying with the treatment long term does take time and patience and discipline. Untreated obstructive sleep apnea when it is moderate to severe can have an adverse impact on cardiovascular health and raise her risk for heart disease, arrhythmias, hypertension, congestive heart failure, stroke and diabetes. Untreated obstructive sleep apnea causes sleep disruption, nonrestorative sleep, and sleep deprivation. This can have an impact on your day to day functioning and cause daytime sleepiness and impairment of cognitive function, memory loss, mood disturbance, and problems focussing. Using CPAP regularly can improve these symptoms.  We will increase the EPR to 3cmH20. This may help with exhaling. I will order an overnight oxygen study to evaluate need for O2. Continue pramipexole for now.   Follow up in 3-4 months

## 2023-05-21 NOTE — Progress Notes (Signed)
PATIENT: Marvin Carr DOB: December 04, 1955  REASON FOR VISIT: follow up HISTORY FROM: patient  [name]: RLS Dr Kandyce Rud: sleep   Chief Complaint  Patient presents with   Follow-up    Pt in room 1. Here for cpap follow up. Pt said he is having a hard time with mask, having a hard time getting comfortable with machine. Pt wakes up frequently, pt said he tries to wear at least 4 hours.  Pt said RLS seems to be better.     HISTORY OF PRESENT ILLNESS:  05/21/23 ALL:  Marvin Carr is a 67 y.o. male here today for follow up for OSA on CPAP. Previously failed CPAP years ago. He is followed by [name] for RLS on pramapexiole and gabapentin. He was seen in consult with Dr Vickey Huger 02/2023. HST showed severe OSA with total AHI 75.7/hr and O2 nadir of 75%. He started therapy 04/12/2023. He has had difficulty tolerating therapy. He is using a [employer] style FFM. He has tried nasal pillow style mask in the past. He wakes often to use restroom. He has nocturia and edema related to chemotherapy. He has an oxygen concentrator from [employer] that he uses with CPAP. He feels that he does better with oxygen. He is able to get a full deep breath. He continues zaleplon 10mg  every night. He takes tramadol 4 times daily for back pain.   He does feel RLS has improved. He continues Mirapex 3-4 times daily. He tried switching gabapentin to 1200mg  TID but felt off. He has titrated off of gabapentin. He does not feel it is needed at this time.      HISTORY: (copied from Dr Dohmeier's previous note)  Marvin Carr is a 67 y.o. male patient who is seen upon referral on 02/22/2023 from [name]  for a Sleep consultation.  Chief concern :  "Marvin Carr is a 67 y.o. male and established  patient of [name] , who has presented to her on 01-29-2023:   Long history of restless leg syndrome Peripheral neuropathy,             Under okay control, but has developed augmentation, taking higher dose of Mirapex 0.75 mg 3 times a  day,             Advised him to push back Mirapex to 0.75 mg 1 to 2 tablets at nighttime only,             During the day, may take higher dose of gabapentin up to 300 mg 2 tablets 4 times a day as needed             He has developed bilateral feet paresthesia during his chemotherapy for bile duct carcinoma, continue to progress, not a good candidate for nerve conduction study due to bilateral lower extremity pitting edema, skin wound,             Iron level".    I have the pleasure of seeing Marvin Carr a right-handed male with a possible sleep disorder. He has had sleep studies in [place] and at [facility] med. Had a study with [name] and no results posted, 2014. Apparently no diagnosis of a sleep disorder 10 years ago. He was offered a trial of CPAP, but failed. ONO with PCP indicated borderline hypoxic events, oxygen trial failed as well.  He has chronic insomnia and takes [place], as provided by [name], MD. He is in oncology treatment and there was prescribed sonata.  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 This is helped by Mirapex.  Was seen in the wound clinic in [place].   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 He lives in a household  alone , with 2 cats. . Family status is divorced , without children, The patient currently works "gigs"  Tobacco use: none .  ETOH use ; in the past,  Caffeine intake in form of Coffee( 2 cups a day) Soda( /) Tea ( /) or energy drinks Exercise in form of walking is limited.    Sleep habits are as follows: The patient's dinner time is between 6-7 PM.  The patient goes to bed at 11-2 AM and continues to sleep for intervals of 2 hours, wakes from unknown causes.  Not from pain, not SOB.  The preferred sleep position is  laterally but edema is interfering, legs elevated now in recliner. Supine now. , with the support of 1-2 pillows.  Dreams are reportedly frequent.  The patient wakes up spontaneously before 9  AM is the usual rise time. He reports not  always feeling refreshed or restored in AM, with symptoms such as dry mouth, morning headaches, and residual fatigue.  Headaches are better  when sleeping in the recliner.  Unintended Naps are taken frequently, lasting from 10 to 30 minutes and are more refreshing than nocturnal sleep.    REVIEW OF SYSTEMS: Out of a complete 14 system review of symptoms, the patient complains only of the following symptoms, and all other reviewed systems are negative.  ESS:  ALLERGIES: No Known Allergies  HOME MEDICATIONS: Outpatient Medications Prior to Visit  Medication Sig Dispense Refill   amLODipine (NORVASC) 5 MG tablet Take 5 mg by mouth daily.     aspirin EC 81 MG tablet Take 81 mg by mouth daily.     bumetanide (BUMEX) 2 MG tablet Take 1 tablet (2 mg total) by mouth in the morning. 30 tablet 2   cyclobenzaprine (FLEXERIL) 10 MG tablet Take 10 mg by mouth at bedtime as needed for muscle spasms.     Durvalumab (IMFINZI IV) Inject into the vein every 21 ( twenty-one) days.     Eszopiclone 3 MG TABS Take 3 mg by mouth at bedtime. Take immediately before bedtime     fenofibrate 160 MG tablet Take 160 mg by mouth daily.     magnesium oxide (MAG-OX) 400 (240 Mg) MG tablet Take 1 tablet (400 mg total) by mouth 2 (two) times daily. (Patient taking differently: Take 400 mg by mouth 3 (three) times daily.) 90 tablet 6   metFORMIN (GLUCOPHAGE-XR) 500 MG 24 hr tablet Take 500 mg by mouth 2 (two) times daily.     metolazone (ZAROXOLYN) 5 MG tablet Take 1 tablet (5 mg total) by mouth 3 (three) times a week. 30 tablet 4   nitroGLYCERIN (NITROSTAT) 0.4 MG SL tablet Place 1 tablet (0.4 mg total) under the tongue every 5 (five) minutes as needed for chest pain. 25 tablet 3    olmesartan (BENICAR) 40 MG tablet TAKE (1) TABLET BY MOUTH ONCE DAILY. 30 tablet 5   pantoprazole (PROTONIX) 40 MG tablet Take 1 tablet (40 mg total) by mouth daily. 90 tablet 0   potassium chloride SA (KLOR-CON M) 20 MEQ tablet Take 1 tablet (20 mEq  total) by mouth daily. 60 tablet 3   pramipexole (MIRAPEX) 0.75 MG tablet Take 1 tablet (0.75 mg total) by mouth 3 (three) times daily. 90 tablet 0   RYBELSUS 7 MG TABS Take 14 mg by mouth daily.     tamsulosin (FLOMAX) 0.4 MG CAPS capsule Take 0.4 mg by mouth daily.     traMADol (ULTRAM) 50 MG tablet Take 50 mg by mouth 4 (four) times daily as needed for moderate pain.     zaleplon (SONATA) 10 MG capsule Take 1 capsule (10 mg total) by mouth at bedtime as needed for sleep. 30 capsule 5   ALPRAZolam (XANAX) 0.5 MG tablet Take 0.5-1 tablets (0.25-0.5 mg total) by mouth at bedtime as needed for anxiety. (Patient not taking: Reported on 05/21/2023) 14 tablet 0   gabapentin (NEURONTIN) 300 MG capsule Take 2 capsules (600 mg total) by mouth 4 (four) times daily. (Patient not taking: Reported on 05/21/2023) 240 capsule 11   methylPREDNISolone (MEDROL DOSEPAK) 4 MG TBPK tablet Take as directed. (Patient not taking: Reported on 05/21/2023) 1 each 0   Facility-Administered Medications Prior to Visit  Medication Dose Route Frequency Provider Last Rate Last Admin   magnesium sulfate 2 GM/50ML IVPB             PAST MEDICAL HISTORY: Past Medical History:  Diagnosis Date   Anxiety    Bladder cancer (HCC) 09/11/2009   CAD (coronary artery disease), native coronary artery    a. Mildly elevated troponin 03/2013, cath with nonobstructive disease including 50% AV groove distal stenosis before large OM   Essential hypertension    Headache(784.0)    History of migraines   Hyperglycemia    Mixed hyperlipidemia    Neuropathy    Obesity    Port-A-Cath in place 09/30/2021   Pre-diabetes    Seasonal allergies    Sleep apnea    On CPAP    PAST SURGICAL HISTORY: Past  Surgical History:  Procedure Laterality Date   BIOPSY  09/23/2021   Procedure: BIOPSY;  Surgeon: Dolores Frame, MD;  [facility];  Service: Gastroenterology;;   COLONOSCOPY  06/28/2011   Procedure: COLONOSCOPY;  Surgeon: Malissa Hippo, MD;  [facility];  Service: Endoscopy;  Laterality: N/A;   COLONOSCOPY WITH PROPOFOL N/A 09/23/2021   Procedure: COLONOSCOPY WITH PROPOFOL;  Surgeon: Dolores Frame, MD;  [facility];  Service: Gastroenterology;  Laterality: N/A;  940   ESOPHAGOGASTRODUODENOSCOPY (EGD) WITH PROPOFOL N/A 09/23/2021   Procedure: ESOPHAGOGASTRODUODENOSCOPY (EGD) WITH PROPOFOL;  Surgeon: Dolores Frame, MD;  [facility];  Service: Gastroenterology;  Laterality: N/A;   IR IMAGING GUIDED PORT INSERTION  09/28/2021   IR PARACENTESIS  09/28/2021   JOINT REPLACEMENT Right    hip   LEFT HEART CATHETERIZATION WITH CORONARY ANGIOGRAM N/A 03/31/2013   Procedure: LEFT HEART CATHETERIZATION WITH CORONARY ANGIOGRAM;  Surgeon: [name], MD;  Location: [facility] CATH LAB;  Service: Cardiovascular;  Laterality: N/A;   POLYPECTOMY  09/23/2021   Procedure: POLYPECTOMY;  Surgeon: Dolores Frame, MD;  [facility]: [facility];  Service: Gastroenterology;;   TURBT  09/11/2009    FAMILY HISTORY: Family History  Problem Relation Age of Onset   COPD Father     SOCIAL HISTORY: Social History   Socioeconomic History   Marital status: Divorced    Spouse name: Not on file   Number of children: 0   Years of education: college   Highest education level: Not on  file  Occupational History    Employer: SELF-EMPLOYED  Tobacco Use   Smoking status: Former    Current packs/day: 0.00    Average packs/day: 0.5 packs/day for 10.0 years (5.0 ttl pk-yrs)    Types: Cigarettes    Start date: 07/09/2001    Quit date: 07/10/2011    Years since quitting: 11.8   Smokeless tobacco: Never   Tobacco comments:    Quit  several yrs prior to 03/2013.  Vaping Use   Vaping status: Never Used  Substance and Sexual Activity   Alcohol use: Yes    Alcohol/week: 0.0 standard drinks of alcohol    Comment: Occasional   Drug use: No   Sexual activity: Not on file  Other Topics Concern   Not on file  Social History Narrative   Not on file   Social Determinants of Health   Financial Resource Strain: Not on file  Food Insecurity: No Food Insecurity (06/05/2022)   Hunger Vital Sign    Worried About Running Out of Food in the Last Year: Never true    Ran Out of Food in the Last Year: Never true  Transportation Needs: No Transportation Needs (06/05/2022)   PRAPARE - Administrator, Civil Service (Medical): No    Lack of Transportation (Non-Medical): No  Physical Activity: Not on file  Stress: Not on file  Social Connections: Not on file  Intimate Partner Violence: Not At Risk (06/05/2022)   Humiliation, Afraid, Rape, and Kick questionnaire    Fear of Current or Ex-Partner: No    Emotionally Abused: No    Physically Abused: No    Sexually Abused: No     PHYSICAL EXAM  Vitals:   05/21/23 1045  BP: 136/74  Pulse: 85  Weight: 291 lb 8 oz (132.2 kg)  Height: 6' (1.829 m)   Body mass index is 39.53 kg/m.  Generalized: Well developed, in no acute distress  Cardiology: normal rate and rhythm, no murmur noted Respiratory: clear to auscultation bilaterally  Neurological examination  Mentation: Alert oriented to time, place, history taking. Follows all commands speech and language fluent Cranial nerve II-XII: Pupils were equal round reactive to light. Extraocular movements were full, visual field were full  Motor: The motor testing reveals 5 over 5 strength of all 4 extremities. Good symmetric motor tone is noted throughout.  Gait and station: Gait is normal.    DIAGNOSTIC DATA (LABS, IMAGING, TESTING) - I reviewed patient records, labs, notes, testing and imaging myself where available.       No data to display           Lab Results  Component Value Date   WBC 7.3 05/15/2023   HGB 13.3 05/15/2023   HCT 40.0 05/15/2023   MCV 89.3 05/15/2023   PLT 235 05/15/2023      Component Value Date/Time   NA 132 (L) 05/15/2023 0935   K 2.7 (LL) 05/15/2023 0935   CL 89 (L) 05/15/2023 0935   CO2 29 05/15/2023 0935   GLUCOSE 164 (H) 05/15/2023 0935   BUN 44 (H) 05/15/2023 0935   CREATININE 1.13 05/15/2023 0935   CALCIUM 9.2 05/15/2023 0935   PROT 6.8 05/15/2023 0935   ALBUMIN 3.8 05/15/2023 0935   AST 31 05/15/2023 0935   ALT 38 05/15/2023 0935   ALKPHOS 84 05/15/2023 0935   BILITOT 0.6 05/15/2023 0935   GFRNONAA >60 05/15/2023 0935   GFRAA >90 06/11/2014 0558   Lab Results  Component Value Date  CHOL 121 07/22/2014   HDL 35 (L) 07/22/2014   LDLCALC 52 07/22/2014   TRIG 169 (H) 07/22/2014   CHOLHDL 3.5 07/22/2014   Lab Results  Component Value Date   HGBA1C 5.1 06/04/2022   Lab Results  Component Value Date   VITAMINB12 2,800 (H) 01/04/2022   Lab Results  Component Value Date   TSH 3.018 05/01/2023     ASSESSMENT AND PLAN 67 y.o. year old male  has a past medical history of Anxiety, Bladder cancer (HCC) (09/11/2009), CAD (coronary artery disease), native coronary artery, Essential hypertension, Headache(784.0), Hyperglycemia, Mixed hyperlipidemia, Neuropathy, Obesity, Port-A-Cath in place (09/30/2021), Pre-diabetes, Seasonal allergies, and Sleep apnea. here with     ICD-10-CM   1. Obstructive sleep apnea  G47.33 Pulse oximetry, overnight    For home use only DME continuous positive airway pressure (CPAP)    2. Neuropathy associated with cancer (HCC)  C80.1    G63     3. RLS (restless legs syndrome)  G25.81     4. History of insomnia  Z87.898        Anthany Polit continues to adjust to CPAP therapy. Compliance report reveals acceptable daily but sub optimal 4 hour compliance. He is concerned about feeling that it is difficult to breath on therapy  and has a hard time maintaining sleep. I will increase EPR to 3cmH20. I will also order and ONO for evaluation of supplemental O2 needs. ESS remains elevated at 18/24. May consider stimulant as indicated pending review with Dr Vickey Huger. He was encouraged to continue using CPAP nightly and for greater than 4 hours each night. We will update supply orders as indicated. Risks of untreated sleep apnea review and education materials provided. He will continue pramipexole 0.75mg  TID. Healthy lifestyle habits encouraged. He will follow up with Dr Vickey Huger in 3-4 months to review ONO, response to pressure adjustments and need for stimulant. He verbalizes understanding and agreement with this plan.    Orders Placed This Encounter  Procedures   For home use only DME continuous positive airway pressure (CPAP)    Increased EPR to 3cmH20.    Order Specific Question:   Length of Need    Answer:   Lifetime    Order Specific Question:   Patient has OSA or probable OSA    Answer:   Yes    Order Specific Question:   Is the patient currently using CPAP in the home    Answer:   Yes    Order Specific Question:   Settings    Answer:   Other see comments    Order Specific Question:   CPAP supplies needed    Answer:   Mask, headgear, cushions, filters, heated tubing and water chamber   Pulse oximetry, overnight    Standing Status:   Future    Standing Expiration Date:   05/20/2024     No orders of the defined types were placed in this encounter.    I spent 30 minutes of face-to-face and non-face-to-face time with patient.  This included previsit chart review, lab review, study review, order entry, electronic health record documentation, patient education.   Shawnie Dapper, FNP-C 05/21/2023, 11:30 AM [facility] [address] [place], [place] [address] [phone]

## 2023-05-23 ENCOUNTER — Other Ambulatory Visit: Payer: Self-pay

## 2023-05-29 ENCOUNTER — Inpatient Hospital Stay: Payer: Medicare Other

## 2023-05-29 ENCOUNTER — Encounter: Payer: Self-pay | Admitting: Family Medicine

## 2023-05-30 ENCOUNTER — Ambulatory Visit (HOSPITAL_COMMUNITY)
Admission: RE | Admit: 2023-05-30 | Discharge: 2023-05-30 | Disposition: A | Discharge status: Home / Self Care | Payer: Medicare Other | Source: Ambulatory Visit | Attending: Hematology | Admitting: Hematology

## 2023-05-30 ENCOUNTER — Other Ambulatory Visit: Payer: Self-pay | Admitting: *Deleted

## 2023-05-30 DIAGNOSIS — C787 Secondary malignant neoplasm of liver and intrahepatic bile duct: Secondary | ICD-10-CM | POA: Diagnosis present

## 2023-05-30 DIAGNOSIS — G4733 Obstructive sleep apnea (adult) (pediatric): Secondary | ICD-10-CM

## 2023-05-30 DIAGNOSIS — C221 Intrahepatic bile duct carcinoma: Secondary | ICD-10-CM | POA: Diagnosis present

## 2023-05-30 DIAGNOSIS — M545 Low back pain, unspecified: Secondary | ICD-10-CM | POA: Diagnosis present

## 2023-05-30 MED ORDER — GADOBUTROL 1 MMOL/ML IV SOLN
10.0000 mL | Freq: Once | INTRAVENOUS | Status: AC | PRN
  Administered 2023-05-30: 10 mL via INTRAVENOUS

## 2023-05-30 NOTE — Progress Notes (Unsigned)
Per Amy's 05-21-2023 last OV she ordered a ONO, but did not state on or off cpap.  Pt uses cpap so I have placed the order  and will need signed off. She is out today and tomorrow.   RE: ONO Received: Today [name], [name], RN; Melvern Sample Got It Thank You     Previous Messages    ----- Message ----- From: Guy Begin, RN Sent: 05/31/2023   7:26 AM EDT To: Melvern Sample; Stacy [name] Subject: FW: ONO                                        Good Morning.  Ok order in for [employer] with cpap  thank you  Andrey Campanile RN ----- Message ----- From: Trina Ao Sent: 05/30/2023  10:08 AM EDT To: Melvern Sample; Stacy [name]; Guy Begin, RN Subject: RE: ONO                                        Yes we can do the ONO, I see the order but it does not say if this is to be done on room air with the cpap or without the cpap. We can complete once the order is updated. Thank you  ----- Message ----- From: Guy Begin, RN Sent: 05/30/2023   7:24 AM EDT To: Melvern Sample; Kennyth Arnold [name] Subject: [name] Male, 67 y.o., 01-09-1956 MRN: [id number]  This was ordered 05-21-2023.  I think you would do this correct?    Let me know    Thank you.  Andrey Campanile

## 2023-05-31 NOTE — Telephone Encounter (Signed)
   Amy's note: 05-21-2023 Return in about 3 months (around 08/20/2023) for 3-4 months with Dr Vickey Huger. Please continue using your CPAP regularly. While your insurance requires that you use CPAP at least 4 hours each night on 70% of the nights, I recommend, that you not skip any nights and use it throughout the night if you can. Getting used to CPAP and staying with the treatment long term does take time and patience and discipline. Untreated obstructive sleep apnea when it is moderate to severe can have an adverse impact on cardiovascular health and raise her risk for heart disease, arrhythmias, hypertension, congestive heart failure, stroke and diabetes. Untreated obstructive sleep apnea causes sleep disruption, nonrestorative sleep, and sleep deprivation. This can have an impact on your day to day functioning and cause daytime sleepiness and impairment of cognitive function, memory loss, mood disturbance, and problems focussing. Using CPAP regularly can improve these symptoms.   We will increase the EPR to 3cmH20. This may help with exhaling. I will order an overnight oxygen study to evaluate need for O2. Continue pramipexole for now.    Follow up in 3-4 months

## 2023-05-31 NOTE — Telephone Encounter (Signed)
I called pt after consulting with MM/NP other NP about DL cpap.  Pt concerned that he is not able to use machine very much, worried about compliance.  Received machine 04-24-2023, so has till 07-25-23 for 90 day compliance.  He  used the autopap last week did fair, then this week not able to use very well, having issues with not being able to catch breath even in reclining position. Order for ono with cpap sent to [facility] and did respond from stacy zott, that did receive.  Questioned if other issues causing the SOB, since not able to breath well even in recline position, see pcp for eval?  he said had CT chest per cancer MD not to long ago for cough and he was placed on steroids.  He has upcoming appts 06/14/2023 with [facility] (cancer center).   I told him that would check with Amy NP on Monday to see what she thinks and get back with him.  If not able to use cpap very well, will hold ono to see if Amy wants to change anything prior to doing that.  Also would like to change to [facility] for DME, I was not aware they did DME.  [facility] is another possibility.  He is not impressed with [facility].  He was ok to see what Amy said.  Appreciated call back.

## 2023-06-01 ENCOUNTER — Emergency Department (HOSPITAL_COMMUNITY): Payer: Medicare Other

## 2023-06-01 ENCOUNTER — Encounter (HOSPITAL_COMMUNITY): Payer: Self-pay | Admitting: Emergency Medicine

## 2023-06-01 ENCOUNTER — Emergency Department (HOSPITAL_COMMUNITY)
Admission: EM | Admit: 2023-06-01 | Discharge: 2023-06-01 | Disposition: A | Discharge status: Home / Self Care | Payer: Medicare Other | Attending: Emergency Medicine | Admitting: Emergency Medicine

## 2023-06-01 ENCOUNTER — Other Ambulatory Visit: Payer: Self-pay

## 2023-06-01 DIAGNOSIS — Z7982 Long term (current) use of aspirin: Secondary | ICD-10-CM | POA: Diagnosis not present

## 2023-06-01 DIAGNOSIS — I10 Essential (primary) hypertension: Secondary | ICD-10-CM | POA: Insufficient documentation

## 2023-06-01 DIAGNOSIS — Z8551 Personal history of malignant neoplasm of bladder: Secondary | ICD-10-CM | POA: Insufficient documentation

## 2023-06-01 DIAGNOSIS — E119 Type 2 diabetes mellitus without complications: Secondary | ICD-10-CM | POA: Insufficient documentation

## 2023-06-01 DIAGNOSIS — R0601 Orthopnea: Secondary | ICD-10-CM | POA: Insufficient documentation

## 2023-06-01 DIAGNOSIS — R0600 Dyspnea, unspecified: Secondary | ICD-10-CM | POA: Diagnosis not present

## 2023-06-01 DIAGNOSIS — R140 Abdominal distension (gaseous): Secondary | ICD-10-CM | POA: Insufficient documentation

## 2023-06-01 DIAGNOSIS — R600 Localized edema: Secondary | ICD-10-CM | POA: Diagnosis not present

## 2023-06-01 DIAGNOSIS — Z79899 Other long term (current) drug therapy: Secondary | ICD-10-CM | POA: Diagnosis not present

## 2023-06-01 DIAGNOSIS — R198 Other specified symptoms and signs involving the digestive system and abdomen: Secondary | ICD-10-CM

## 2023-06-01 DIAGNOSIS — R0602 Shortness of breath: Secondary | ICD-10-CM | POA: Diagnosis present

## 2023-06-01 LAB — CBC WITH DIFFERENTIAL/PLATELET
Abs Immature Granulocytes: 0.04 10*3/uL (ref 0.00–0.07)
Basophils Absolute: 0 10*3/uL (ref 0.0–0.1)
Basophils Relative: 1 %
Eosinophils Absolute: 0.2 10*3/uL (ref 0.0–0.5)
Eosinophils Relative: 3 %
HCT: 39.4 % (ref 39.0–52.0)
Hemoglobin: 13.1 g/dL (ref 13.0–17.0)
Immature Granulocytes: 1 %
Lymphocytes Relative: 21 %
Lymphs Abs: 1.4 10*3/uL (ref 0.7–4.0)
MCH: 29.4 pg (ref 26.0–34.0)
MCHC: 33.2 g/dL (ref 30.0–36.0)
MCV: 88.5 fL (ref 80.0–100.0)
Monocytes Absolute: 0.8 10*3/uL (ref 0.1–1.0)
Monocytes Relative: 12 %
Neutro Abs: 4.1 10*3/uL (ref 1.7–7.7)
Neutrophils Relative %: 62 %
Platelets: 286 10*3/uL (ref 150–400)
RBC: 4.45 MIL/uL (ref 4.22–5.81)
RDW: 14.1 % (ref 11.5–15.5)
WBC: 6.5 10*3/uL (ref 4.0–10.5)
nRBC: 0 % (ref 0.0–0.2)

## 2023-06-01 LAB — COMPREHENSIVE METABOLIC PANEL
ALT: 26 U/L (ref 0–44)
AST: 21 U/L (ref 15–41)
Albumin: 3.7 g/dL (ref 3.5–5.0)
Alkaline Phosphatase: 70 U/L (ref 38–126)
Anion gap: 12 (ref 5–15)
BUN: 23 mg/dL (ref 8–23)
CO2: 27 mmol/L (ref 22–32)
Calcium: 10.5 mg/dL — ABNORMAL HIGH (ref 8.9–10.3)
Chloride: 98 mmol/L (ref 98–111)
Creatinine, Ser: 0.91 mg/dL (ref 0.61–1.24)
GFR, Estimated: >60 mL/min (ref >60)
Glucose, Bld: 140 mg/dL — ABNORMAL HIGH (ref 70–99)
Potassium: 4.1 mmol/L (ref 3.5–5.1)
Sodium: 137 mmol/L (ref 135–145)
Total Bilirubin: 0.4 mg/dL (ref 0.3–1.2)
Total Protein: 6.8 g/dL (ref 6.5–8.1)

## 2023-06-01 LAB — BRAIN NATRIURETIC PEPTIDE: B Natriuretic Peptide: 109 pg/mL — ABNORMAL HIGH (ref 0.0–100.0)

## 2023-06-01 LAB — PROTIME-INR
INR: 0.9 (ref 0.8–1.2)
Prothrombin Time: 12.5 seconds (ref 11.4–15.2)

## 2023-06-01 MED ORDER — ALPRAZOLAM 0.5 MG PO TABS: 0.5000 mg | ORAL_TABLET | Freq: Every evening | ORAL | 0 refills | Status: DC | PRN

## 2023-06-01 MED ORDER — IOHEXOL 350 MG/ML SOLN
100.0000 mL | Freq: Once | INTRAVENOUS | Status: AC | PRN
  Administered 2023-06-01: 100 mL via INTRAVENOUS

## 2023-06-01 MED ORDER — SODIUM CHLORIDE 0.9 % IV BOLUS
500.0000 mL | Freq: Once | INTRAVENOUS | Status: AC
  Administered 2023-06-01: 500 mL via INTRAVENOUS

## 2023-06-01 MED ORDER — FUROSEMIDE 10 MG/ML IJ SOLN
40.0000 mg | Freq: Once | INTRAMUSCULAR | Status: AC
  Administered 2023-06-01: 40 mg via INTRAVENOUS
  Filled 2023-06-01: qty 4

## 2023-06-01 NOTE — ED Provider Notes (Signed)
[place] EMERGENCY DEPARTMENT AT [facility] Provider Note   CSN: [id number] Arrival date & time: 06/01/23  7628     History  Chief Complaint  Patient presents with   Shortness of Breath    Marvin Carr is a 67 y.o. male.  HPI    67 year old patient with history of liver and peritoneal carcinomatosis, bladder cancer, hypertension, diabetes comes in with chief complaint of shortness of breath.  Patient is also on Zaroxolyn, he is unsure if he has CHF, but he takes it for leg swelling.  Patient indicates that over the last 2 days he has been having shortness of breath.  Shortness of breath is particularly intense at nighttime when he is trying to lay flat.  He has also noticed increased abdominal girth over the last week and he has had 7 pound weight gain.  Pitting edema is at baseline.  Patient feels like he is having ascites again and needs it to be drained.  Last time the ascites required drainage was about 2 years ago, and he required few sessions every week.  But lately his ascites has been in control.  Patient denies any new cough, chest pain.  No history of PE.  Home Medications Prior to Admission medications   Medication Sig Start Date End Date Taking? Authorizing Provider  ALPRAZolam Prudy Feeler) 0.5 MG tablet Take 1 tablet (0.5 mg total) by mouth at bedtime as needed for anxiety. 06/01/23  Yes Kuper Rennels, MD  amLODipine (NORVASC) 5 MG tablet Take 5 mg by mouth daily. 02/21/22   [provider]  aspirin EC 81 MG tablet Take 81 mg by mouth daily.    [provider]  bumetanide (BUMEX) 2 MG tablet Take 1 tablet (2 mg total) by mouth in the morning. 12/11/22   Doreatha Massed, MD  cyclobenzaprine (FLEXERIL) 10 MG tablet Take 10 mg by mouth at bedtime as needed for muscle spasms.    [provider]  Durvalumab (IMFINZI IV) Inject into the vein every 21 ( twenty-one) days. 10/05/21   [provider]  Eszopiclone 3 MG TABS Take 3  mg by mouth at bedtime. Take immediately before bedtime    [provider]  fenofibrate 160 MG tablet Take 160 mg by mouth daily. 11/05/19   [provider]  magnesium oxide (MAG-OX) 400 (240 Mg) MG tablet Take 1 tablet (400 mg total) by mouth 2 (two) times daily. Patient taking differently: Take 400 mg by mouth 3 (three) times daily. 11/16/21   Doreatha Massed, MD  metFORMIN (GLUCOPHAGE-XR) 500 MG 24 hr tablet Take 500 mg by mouth 2 (two) times daily. 08/16/21   [provider]  metolazone (ZAROXOLYN) 5 MG tablet Take 1 tablet (5 mg total) by mouth 3 (three) times a week. 04/04/23   Doreatha Massed, MD  nitroGLYCERIN (NITROSTAT) 0.4 MG SL tablet Place 1 tablet (0.4 mg total) under the tongue every 5 (five) minutes as needed for chest pain. 09/26/18   Jonelle Sidle, MD  olmesartan (BENICAR) 40 MG tablet TAKE (1) TABLET BY MOUTH ONCE DAILY. 03/23/23   Doreatha Massed, MD  pantoprazole (PROTONIX) 40 MG tablet Take 1 tablet (40 mg total) by mouth daily. 03/01/23   Doreatha Massed, MD  potassium chloride SA (KLOR-CON M) 20 MEQ tablet Take 1 tablet (20 mEq total) by mouth daily. 02/01/23   Doreatha Massed, MD  pramipexole (MIRAPEX) 0.75 MG tablet Take 1 tablet (0.75 mg total) by mouth 3 (three) times daily. 08/30/22   Ellin Saba,  Vern Claude, MD  RYBELSUS 7 MG TABS Take 14 mg by mouth daily. 08/16/21   [provider]  tamsulosin (FLOMAX) 0.4 MG CAPS capsule Take 0.4 mg by mouth daily. 08/29/21   [provider]  traMADol (ULTRAM) 50 MG tablet Take 50 mg by mouth 4 (four) times daily as needed for moderate pain. 10/13/21   [provider]  zaleplon (SONATA) 10 MG capsule Take 1 capsule (10 mg total) by mouth at bedtime as needed for sleep. 03/01/23   Doreatha Massed, MD  prochlorperazine (COMPAZINE) 10 MG tablet Take 1 tablet (10 mg total) by mouth every 6 (six) hours as needed (Nausea or vomiting). 09/30/21 05/16/22  Doreatha Massed, MD      Allergies    Patient has no known allergies.    Review of Systems   Review of Systems  All other systems reviewed and are negative.   Physical Exam Updated Vital Signs BP (!) 153/85   Pulse 71   Temp 97.6 F (36.4 C)   Resp 16   Ht 6' (1.829 m)   Wt 133.8 kg   SpO2 96%   BMI 40.01 kg/m  Physical Exam Vitals and nursing note reviewed.  Constitutional:      Appearance: He is well-developed.  HENT:     Head: Atraumatic.  Neck:     Vascular: No JVD.  Cardiovascular:     Rate and Rhythm: Normal rate.  Pulmonary:     Effort: Pulmonary effort is normal.     Breath sounds: No decreased breath sounds or rales.  Abdominal:     Palpations: Abdomen is soft.     Comments: Abdomen is distended, it appears that he has a fluid wave shift  Musculoskeletal:     Cervical back: Neck supple.     Right lower leg: Edema present.     Left lower leg: Edema present.  Skin:    General: Skin is warm.     Findings: Erythema present.  Neurological:     Mental Status: He is alert and oriented to person, place, and time.     ED Results / Procedures / Treatments   Labs (all labs ordered are listed, but only abnormal results are displayed) Labs Reviewed  COMPREHENSIVE METABOLIC PANEL - Abnormal; Notable for the following components:      Result Value   Glucose, Bld 140 (*)    Calcium 10.5 (*)    All other components within normal limits  BRAIN NATRIURETIC PEPTIDE - Abnormal; Notable for the following components:   B Natriuretic Peptide 109.0 (*)    All other components within normal limits  CBC WITH DIFFERENTIAL/PLATELET  PROTIME-INR    EKG EKG Interpretation Date/Time:  Friday June 01 2023 08:54:56 EDT Ventricular Rate:  75 PR Interval:  173 QRS Duration:  101 QT Interval:  381 QTC Calculation: 426 R Axis:   47  Text Interpretation: Sinus rhythm Low voltage, precordial leads No acute changes No significant change since last tracing Confirmed by  Derwood Kaplan [phone]) on 06/01/2023 2:11:46 PM  Radiology CT Angio Chest PE W and/or Wo Contrast  Result Date: 06/01/2023 CLINICAL DATA:  Shortness of breath, acute generalized abdominal pain. History hepatic cancer. EXAM: CT ANGIOGRAPHY CHEST CT ABDOMEN AND PELVIS WITH CONTRAST TECHNIQUE: Multidetector CT imaging of the chest was performed using the standard protocol during bolus administration of intravenous contrast. Multiplanar CT image reconstructions and MIPs were obtained to evaluate the vascular anatomy. Multidetector CT imaging of the abdomen and pelvis was performed  using the standard protocol during bolus administration of intravenous contrast. RADIATION DOSE REDUCTION: This exam was performed according to the departmental dose-optimization program which includes automated exposure control, adjustment of the mA and/or kV according to patient size and/or use of iterative reconstruction technique. CONTRAST:  OMNIPAQUE IOHEXOL 350 MG/ML SOLN COMPARISON:  March 27, 2023. FINDINGS: CTA CHEST FINDINGS Cardiovascular: Satisfactory opacification of the pulmonary arteries to the segmental level. No evidence of pulmonary embolism. Normal heart size. No pericardial effusion. Mediastinum/Nodes: No enlarged mediastinal, hilar, or axillary lymph nodes. Thyroid gland, trachea, and esophagus demonstrate no significant findings. Lungs/Pleura: Lungs are clear. No pleural effusion or pneumothorax. Musculoskeletal: No chest wall abnormality. No acute or significant osseous findings. Review of the MIP images confirms the above findings. CT ABDOMEN and PELVIS FINDINGS Hepatobiliary: Cholelithiasis. No biliary dilatation. Hepatic steatosis. Left hepatic lesion noted on prior exam is not well visualized currently. Pancreas: Unremarkable. No pancreatic ductal dilatation or surrounding inflammatory changes. Spleen: Normal in size without focal abnormality. Adrenals/Urinary Tract: Adrenal glands appear normal. Small  right renal cyst is noted for which no further follow-up is required. No hydronephrosis or renal obstruction is noted. Urinary bladder is unremarkable. Stomach/Bowel: Stomach is within normal limits. Appendix appears normal. No evidence of bowel wall thickening, distention, or inflammatory changes. Sigmoid diverticulosis without inflammation. Vascular/Lymphatic: Aortic atherosclerosis. No enlarged abdominal or pelvic lymph nodes. Reproductive: Prostate is unremarkable. Other: No abdominal wall hernia or abnormality. No abdominopelvic ascites. Musculoskeletal: Status post right total hip arthroplasty. No acute osseous abnormality is noted. Review of the MIP images confirms the above findings. IMPRESSION: No definite evidence of pulmonary embolus. Hepatic steatosis. Cholelithiasis. Sigmoid diverticulosis without inflammation. Aortic Atherosclerosis (ICD10-I70.0). Electronically Signed   By: Lupita Raider M.D.   On: 06/01/2023 15:34   CT ABDOMEN PELVIS W CONTRAST  Result Date: 06/01/2023 CLINICAL DATA:  Shortness of breath, acute generalized abdominal pain. History hepatic cancer. EXAM: CT ANGIOGRAPHY CHEST CT ABDOMEN AND PELVIS WITH CONTRAST TECHNIQUE: Multidetector CT imaging of the chest was performed using the standard protocol during bolus administration of intravenous contrast. Multiplanar CT image reconstructions and MIPs were obtained to evaluate the vascular anatomy. Multidetector CT imaging of the abdomen and pelvis was performed using the standard protocol during bolus administration of intravenous contrast. RADIATION DOSE REDUCTION: This exam was performed according to the departmental dose-optimization program which includes automated exposure control, adjustment of the mA and/or kV according to patient size and/or use of iterative reconstruction technique. CONTRAST:  OMNIPAQUE IOHEXOL 350 MG/ML SOLN COMPARISON:  March 27, 2023. FINDINGS: CTA CHEST FINDINGS Cardiovascular: Satisfactory  opacification of the pulmonary arteries to the segmental level. No evidence of pulmonary embolism. Normal heart size. No pericardial effusion. Mediastinum/Nodes: No enlarged mediastinal, hilar, or axillary lymph nodes. Thyroid gland, trachea, and esophagus demonstrate no significant findings. Lungs/Pleura: Lungs are clear. No pleural effusion or pneumothorax. Musculoskeletal: No chest wall abnormality. No acute or significant osseous findings. Review of the MIP images confirms the above findings. CT ABDOMEN and PELVIS FINDINGS Hepatobiliary: Cholelithiasis. No biliary dilatation. Hepatic steatosis. Left hepatic lesion noted on prior exam is not well visualized currently. Pancreas: Unremarkable. No pancreatic ductal dilatation or surrounding inflammatory changes. Spleen: Normal in size without focal abnormality. Adrenals/Urinary Tract: Adrenal glands appear normal. Small right renal cyst is noted for which no further follow-up is required. No hydronephrosis or renal obstruction is noted. Urinary bladder is unremarkable. Stomach/Bowel: Stomach is within normal limits. Appendix appears normal. No evidence of bowel wall thickening, distention, or inflammatory changes.  Sigmoid diverticulosis without inflammation. Vascular/Lymphatic: Aortic atherosclerosis. No enlarged abdominal or pelvic lymph nodes. Reproductive: Prostate is unremarkable. Other: No abdominal wall hernia or abnormality. No abdominopelvic ascites. Musculoskeletal: Status post right total hip arthroplasty. No acute osseous abnormality is noted. Review of the MIP images confirms the above findings. IMPRESSION: No definite evidence of pulmonary embolus. Hepatic steatosis. Cholelithiasis. Sigmoid diverticulosis without inflammation. Aortic Atherosclerosis (ICD10-I70.0). Electronically Signed   By: Lupita Raider M.D.   On: 06/01/2023 15:34   DG Chest Port 1 View  Result Date: 06/01/2023 CLINICAL DATA:  shob, ascites complains EXAM: PORTABLE CHEST 1 VIEW  COMPARISON:  CXR 02/28/22 FINDINGS: Right-sided chest port with unchanged positioning. No pleural effusion. No pneumothorax. Left basilar atelectasis. Normal cardiac and mediastinal contours. No radiographically apparent displaced rib fractures. Visualized upper abdomen unremarkable. IMPRESSION: Left basilar atelectasis. Electronically Signed   By: Lorenza Cambridge M.D.   On: 06/01/2023 08:48    Procedures Ultrasound ED Abd  Date/Time: 06/01/2023 2:13 PM  Performed by: Derwood Kaplan, MD Authorized by: Derwood Kaplan, MD   Procedure details:    Indications: abdominal pain     Assessment for:  Intra-abdominal fluid   Aorta:  Not visualized   Left renal:  Visualized   Right renal:  Visualized   Hepatobiliary:  Visualized   Bladder:  Visualized    Images: archived   Study Limitations: body habitus Left renal findings:    Intra-abdominal fluid: not identified     Perinephric fluid: not identified   Right renal findings:    Intra-abdominal fluid: not identified     Perinephric fluid: not identified   Hepatobiliary findings:    Sonographic Murphy's sign: negative   Bladder findings:    Free pelvic fluid: identified     Volume:  Trace fluid around the bladder Ultrasound ED Echo  Date/Time: 06/01/2023 2:14 PM  Performed by: Derwood Kaplan, MD Authorized by: Derwood Kaplan, MD   Procedure details:    Indications: dyspnea     Views: subxiphoid and parasternal long axis view     Images: archived     Limitations:  Acoustic shadowing and body habitus Findings:    Pericardium: no pericardial effusion     LV Function: normal (>50% EF)     IVC: normal   Impression:    Impression: normal       Medications Ordered in ED Medications  sodium chloride 0.9 % bolus 500 mL (0 mLs Intravenous Stopped 06/01/23 1550)  iohexol (OMNIPAQUE) 350 MG/ML injection 100 mL (100 mLs Intravenous Contrast Given 06/01/23 1353)  furosemide (LASIX) injection 40 mg (40 mg Intravenous Given 06/01/23 1553)     ED Course/ Medical Decision Making/ A&P                                 Medical Decision Making Amount and/or Complexity of Data Reviewed Labs: ordered. Radiology: ordered.  Risk Prescription drug management.  This patient presents to the ED with chief complaint(s) of increased abdominal girth, shortness of breath. With pertinent past medical history of remote bladder cancer, liver/peritoneal carcinoma with last chemotherapy being last year and previous history of ascites requiring paracentesis.The complaint involves an extensive differential diagnosis and also carries with it a high risk of complications and morbidity.    The differential diagnosis includes : Worsening ascites leading to shortness of breath, CHF, pleural effusion, PE, pneumonia, COVID, severe anemia  The initial plan is to get basic labs.  I will also perform bedside ultrasound.   Additional history obtained: Records reviewed previous admission documents and previous oncology notes  Independent labs interpretation:  The following labs were independently interpreted:  Patient's CBC does not show any anemia or severe leukocytosis, patient does have elevated BNP.  Independent visualization and interpretation of imaging: - I independently visualized the following imaging with scope of interpretation limited to determining acute life threatening conditions related to emergency care: X-ray of the chest, which revealed no evidence of pleural effusion, pneumonia or severe interstitial edema.  Treatment and Reassessment: Patient's x-ray is reassuring.  Bedside ultrasound did not reveal profound ascites or pericardial effusion.  Neck step is to get CT angio chest to rule out PE. Will also add CT abdomen and pelvis to evaluate for ascites and worsening of the cancer since patient is mentioning increase girth and weight gain.  4:02 PM Patient CT scan is reassuring.  Results discussed with him. He indicates to me that  he thinks anxiety might be contributing at nighttime, because he just darted using BiPAP machine.  He was on Xanax, but he ran out.  I will prescribe him 3 days of Xanax to see if it helps.  If it does, then he will advise his PCP to prescribe more if needed. If that does not work, then patient will double up his Bumex for 3 days and follow-up with cardiology and PCP.  Strict ER return precautions have been discussed, and patient is agreeing with the plan and is comfortable with the workup done and the recommendations from the ER.      Final Clinical Impression(s) / ED Diagnoses Final diagnoses:  Dyspnea, unspecified type  Increased abdominal girth  Orthopnea    Rx / DC Orders ED Discharge Orders          Ordered    Ambulatory referral to Cardiology       Comments: If you have not heard from the Cardiology office within the next 72 hours please call [phone].   06/01/23 1547    ALPRAZolam (XANAX) 0.5 MG tablet  At bedtime PRN        06/01/23 1602              Derwood Kaplan, MD 06/01/23 1603

## 2023-06-01 NOTE — Discharge Instructions (Addendum)
We saw you in the emergency room for abdominal girth in the shortness of breath.  Our workup in the ER is reassuring.  CT scan does not show any blood clot, large fluid collection around your lungs, heart in the abdomen does not reveal any worsening of cancer or significant ascites.  My suspicion is that you might be having slightly worsening CHF.  We recommend that you double up your water pill for the next 3 days and see the cardiologist as soon as possible.  Please return to the ER if you start having worsening shortness of breath.

## 2023-06-01 NOTE — ED Triage Notes (Signed)
Pt hx SOB due to ascites to abd, hx of liver and bile duct cancer, hx of same

## 2023-06-04 ENCOUNTER — Other Ambulatory Visit: Payer: Self-pay | Admitting: *Deleted

## 2023-06-04 DIAGNOSIS — R0602 Shortness of breath: Secondary | ICD-10-CM

## 2023-06-04 MED ORDER — BUMETANIDE 2 MG PO TABS: 2.0000 mg | ORAL_TABLET | Freq: Every morning | ORAL | 2 refills | Status: DC

## 2023-06-06 ENCOUNTER — Inpatient Hospital Stay: Payer: Medicare Other

## 2023-06-10 ENCOUNTER — Other Ambulatory Visit: Payer: Self-pay | Admitting: Hematology

## 2023-06-10 DIAGNOSIS — R0602 Shortness of breath: Secondary | ICD-10-CM

## 2023-06-12 ENCOUNTER — Ambulatory Visit: Payer: Medicare Other | Admitting: Family Medicine

## 2023-06-13 NOTE — Progress Notes (Signed)
[facility] [address] [place] [address]    Clinic Day:  06/14/2023  Referring physician: Benita Stabile, MD  Patient Care Team: Benita Stabile, MD as PCP - General (Internal Medicine) Jonelle Sidle, MD as PCP - Cardiology (Cardiology) Jonelle Sidle, MD as Consulting Physician (Cardiology) Doreatha Massed, MD as Medical Oncologist (Medical Oncology)   ASSESSMENT & PLAN:   Assessment: 1. Liver lesion/peritoneal carcinomatosis: - Patient seen at the request of Dr. [name] - Reported pressure on the sides of the abdomen with slight pain for the last 1 month.  He also reported pain in the epigastric region. - He had lost 20 pounds in the last 3 to 4 months intentionally, cutting back on sugars.  He was also started on semaglutide. - Colonoscopy on 06/28/2011 with a benign polypoid colonic mucosa in the descending colon.  No evidence of malignancy. - MRI of the brain was negative. - EGD and colonoscopy on 09/23/2018 did not reveal any malignancies. 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  IHC positive for CK7, CDX2 and negative for GATA3.  Findings suggestive of upper GI or pancreaticobiliary primary. - Cycle 1 of gemcitabine, cisplatin and durvalumab started on 10/05/2021. - NGS: No targetable mutations.  PD-L1 (NW295) negative.  MSI-stable.  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   2. Social/family history: - He lives at home by himself.  He records voiceover/narrations. - Non-smoker. - Father died of MDS.  Paternal grandfather had prostate cancer.  Maternal grandfather had cancer.  3.  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 Laverle Patter.  Reportedly received 1 treatment of intravesical chemo and has been on surveillance since then.  Last surveillance visit was in 2020.    Plan: 1. Cholangiocarcinoma with peritoneal carcinomatosis: - He does not report any immunotherapy  related side effects. - Reviewed CT CAP from 06/01/2023: No evidence of malignancy.  No pulmonary embolism. - Labs today: Normal LFTs and creatinine.  CBC grossly normal. - He reports some shortness of breath which is not improving.  This is more prominent since he was started on CPAP.  Recommend pulmonary evaluation. - Continue treatment every 4 weeks with durvalumab.  RTC 8 weeks for follow-up.   2. Fluid retention: - Continue Bumex 2 mg daily.  Continue metolazone 3 times weekly.  3.  Neuropathy/"drawing" of legs: - Continue Mirapex 0.75 mg twice daily and gabapentin 300 mg 4 times daily.  4.  Sleeping difficulty: - Continue Lunesta as needed.  5.  Hypomagnesemia/hypokalemia: - Continue potassium 20 mEq daily and magnesium 3 times daily.  Both are normal today.  6.  Right-sided lower back pain: - I have reviewed MRI lumbar spine and pelvis from 05/30/2023.  Degenerative disc disease.  No evidence of malignancy.  He reports pain has improved.    No orders of the defined types were placed in this encounter.     Alben Deeds Teague,acting as a Neurosurgeon for Doreatha Massed, MD.,have documented all relevant documentation on the behalf of Doreatha Massed, MD,as directed by  Doreatha Massed, MD while in the presence of Doreatha Massed, MD.  I, Doreatha Massed MD, have reviewed the above documentation for accuracy and completeness, and I agree with the above.       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 male presenting to clinic today for follow up of cholangiocarcinoma and peritoneal carcinomatosis. He was last seen by me on 05/15/23.  Since his last visit, he presented to the ED on 06/01/23 for SOB. He notes he experienced symptoms before and while using CPAP. He reports SOB when taking CPAP off, that did not improve when resting while sitting up leading him to come to the ED. Imaging and labs were inconclusive for etiology of SOB. He had a referral placed to cardiology and has an appointment on 07/20/23. He has not seen a pulmonologist but would like to be referred to one.   He underwent MRI of the lumbar spine on 05/30/23 that found: no evidence for metastatic disease to the lumbar spine; mild left foraminal narrowing at L3-4 secondary to a leftward disc protrusion and annular tear; mild foraminal narrowing bilaterally at L4-5 is stable; and asymmetric right-sided facet hypertrophy at L5-S1 without significant disc protrusion or stenosis. He laso underwent an MRI of the pelvis on 05/30/23 that found: no findings of metastatic disease in the pelvis; lipoma in between the left gluteus medius and gluteus maximus muscles measuring about 9.0 by 3.9 by 15.3 cm, this may mildly medially displace the left sciatic nerve but no edema or inflammatory findings involving the sciatic nerve observed; descending and sigmoid colon diverticulosis; mild benign prostatic hypertrophy; and degenerative disc disease at L4-5.  Today, he states that he is doing well overall. His appetite level is at 100%. His energy level is at 40%.  He reports  right-sided lower back pain has improved since last visit, though there are certain movements that cause pain to flare up. Sleep disturbance has not been a major issue with him recently, though sleep is improved when not using the CPAP. He has recently changed the CPAP masks he uses that also improves sleep.   He notes blistering on the bilateral shins that have ruptured. He treats blisters with Neosporin. He notes he has had this blistering before and was hospitalized from it, treated with antibiotics and other medications until symptoms improved. He denies any MRSA infections.   PAST MEDICAL HISTORY:   Past Medical History: Past Medical History:  Diagnosis Date   Anxiety    Bladder cancer (HCC) 09/11/2009   CAD (coronary artery disease), native coronary artery    a. Mildly elevated troponin 03/2013, cath with nonobstructive disease including 50% AV groove distal stenosis before large OM   Essential hypertension    Headache(784.0)    History of migraines   Hyperglycemia    Mixed hyperlipidemia    Neuropathy    Obesity    Port-A-Cath in place 09/30/2021   Pre-diabetes    Seasonal allergies    Sleep apnea    On CPAP    Surgical History: Past Surgical History:  Procedure Laterality Date   BIOPSY  09/23/2021   Procedure: BIOPSY;  Surgeon: Dolores Frame, MD;  [facility];  Service: Gastroenterology;;   COLONOSCOPY  06/28/2011   Procedure: COLONOSCOPY;  Surgeon: Malissa Hippo, MD;  [facility];  Service: Endoscopy;  Laterality: N/A;   COLONOSCOPY WITH PROPOFOL N/A 09/23/2021   Procedure: COLONOSCOPY WITH PROPOFOL;  Surgeon: Dolores Frame, MD;  [facility]: [facility];  Service: Gastroenterology;  Laterality: N/A;  940   ESOPHAGOGASTRODUODENOSCOPY (EGD) WITH PROPOFOL N/A 09/23/2021   Procedure: ESOPHAGOGASTRODUODENOSCOPY (EGD) WITH PROPOFOL;  Surgeon: Dolores Frame, MD;  [facility]: [facility];  Service: Gastroenterology;   Laterality: N/A;   IR IMAGING GUIDED PORT INSERTION  09/28/2021   IR PARACENTESIS  09/28/2021   JOINT REPLACEMENT Right    hip   LEFT HEART CATHETERIZATION WITH CORONARY ANGIOGRAM N/A 03/31/2013   Procedure: LEFT HEART CATHETERIZATION WITH CORONARY ANGIOGRAM;  Surgeon: [name], MD;  Location: [facility] CATH LAB;  Service: Cardiovascular;  Laterality: N/A;   POLYPECTOMY  09/23/2021   Procedure: POLYPECTOMY;  Surgeon: Dolores Frame, MD;  [facility]: [facility];  Service: Gastroenterology;;   TURBT  09/11/2009    Social History: Social History   Socioeconomic History   Marital status: Divorced    Spouse name: Not on file   Number of children: 0   Years of education: college   Highest education level: Not on file  Occupational History    Employer: SELF-EMPLOYED  Tobacco Use   Smoking status: Former    Current packs/day: 0.00    Average packs/day: 0.5 packs/day for 10.0 years (5.0 ttl pk-yrs)    Types: Cigarettes    Start date: 07/09/2001    Quit date: 07/10/2011    Years since quitting: 11.9   Smokeless tobacco: Never   Tobacco comments:    Quit several yrs prior to 03/2013.  Vaping Use   Vaping status: Never Used  Substance and Sexual Activity   Alcohol use: Yes    Alcohol/week: 0.0 standard drinks of alcohol    Comment: Occasional   Drug use: No   Sexual activity: Not on file  Other Topics Concern   Not on file  Social History Narrative   Not on file   Social Determinants of Health   Financial Resource Strain: Not on file  Food Insecurity: No Food Insecurity (06/05/2022)   Hunger Vital Sign    Worried About Running Out of Food in the Last Year: Never true    Ran Out of Food in the Last Year: Never true  Transportation Needs: No Transportation Needs (06/05/2022)   PRAPARE - Administrator, Civil Service (Medical): No    Lack of Transportation (Non-Medical): No  Physical Activity: Not on file  Stress: Not on file  Social Connections: Not on  file  Intimate Partner Violence: Not At Risk (06/05/2022)   Humiliation, Afraid, Rape, and Kick questionnaire    Fear of Current or Ex-Partner: No    Emotionally Abused: No    Physically Abused: No    Sexually Abused: No    Family History: Family History  Problem Relation Age of Onset   COPD Father     Current Medications:  Current Outpatient Medications:    ALPRAZolam (XANAX) 0.5 MG tablet, Take 1 tablet (0.5 mg total) by mouth at bedtime as needed for anxiety., Disp: 3 tablet, Rfl: 0   amLODipine (NORVASC) 5 MG tablet, Take 5 mg by mouth daily., Disp: ,  Rfl:    aspirin EC 81 MG tablet, Take 81 mg by mouth daily., Disp: , Rfl:    bumetanide (BUMEX) 2 MG tablet, TAKE (1) TABLET BY MOUTH EACH MORNING., Disp: 30 tablet, Rfl: 2   cyclobenzaprine (FLEXERIL) 10 MG tablet, Take 10 mg by mouth at bedtime as needed for muscle spasms., Disp: , Rfl:    Durvalumab (IMFINZI IV), Inject into the vein every 21 ( twenty-one) days., Disp: , Rfl:    Eszopiclone 3 MG TABS, Take 3 mg by mouth at bedtime. Take immediately before bedtime, Disp: , Rfl:    fenofibrate 160 MG tablet, Take 160 mg by mouth daily., Disp: , Rfl:    magnesium oxide (MAG-OX) 400 (240 Mg) MG tablet, Take 1 tablet (400 mg total) by mouth 2 (two) times daily. (Patient taking differently: Take 400 mg by mouth 3 (three) times daily.), Disp: 90 tablet, Rfl: 6   metFORMIN (GLUCOPHAGE-XR) 500 MG 24 hr tablet, Take 500 mg by mouth 2 (two) times daily., Disp: , Rfl:    metolazone (ZAROXOLYN) 5 MG tablet, Take 1 tablet (5 mg total) by mouth 3 (three) times a week., Disp: 30 tablet, Rfl: 4   nitroGLYCERIN (NITROSTAT) 0.4 MG SL tablet, Place 1 tablet (0.4 mg total) under the tongue every 5 (five) minutes as needed for chest pain., Disp: 25 tablet, Rfl: 3   olmesartan (BENICAR) 40 MG tablet, TAKE (1) TABLET BY MOUTH ONCE DAILY., Disp: 30 tablet, Rfl: 5   pantoprazole (PROTONIX) 40 MG tablet, Take 1 tablet (40 mg total) by mouth daily., Disp: 90  tablet, Rfl: 0   potassium chloride SA (KLOR-CON M) 20 MEQ tablet, Take 1 tablet (20 mEq total) by mouth daily., Disp: 60 tablet, Rfl: 3   pramipexole (MIRAPEX) 0.75 MG tablet, Take 1 tablet (0.75 mg total) by mouth 3 (three) times daily., Disp: 90 tablet, Rfl: 0   RYBELSUS 7 MG TABS, Take 14 mg by mouth daily., Disp: , Rfl:    tamsulosin (FLOMAX) 0.4 MG CAPS capsule, Take 0.4 mg by mouth daily., Disp: , Rfl:    traMADol (ULTRAM) 50 MG tablet, Take 50 mg by mouth 4 (four) times daily as needed for moderate pain., Disp: , Rfl:    zaleplon (SONATA) 10 MG capsule, Take 1 capsule (10 mg total) by mouth at bedtime as needed for sleep., Disp: 30 capsule, Rfl: 5 No current facility-administered medications for this visit.  Facility-Administered Medications Ordered in Other Visits:    magnesium sulfate 2 GM/50ML IVPB, , , ,    Allergies: No Known Allergies  REVIEW OF SYSTEMS:   Review of Systems  Constitutional:  Negative for chills, fatigue and fever.  HENT:   Negative for lump/mass, mouth sores, nosebleeds, sore throat and trouble swallowing.   Eyes:  Negative for eye problems.  Respiratory:  Positive for shortness of breath. Negative for cough.   Cardiovascular:  Positive for leg swelling. Negative for chest pain and palpitations.  Gastrointestinal:  Negative for abdominal pain, constipation, diarrhea, nausea and vomiting.  Genitourinary:  Negative for bladder incontinence, difficulty urinating, dysuria, frequency, hematuria and nocturia.   Musculoskeletal:  Negative for arthralgias, back pain, flank pain, myalgias and neck pain.  Skin:  Negative for itching and rash.  Neurological:  Positive for numbness (and burning in feet, 7/10 severity). Negative for dizziness and headaches.  Hematological:  Does not bruise/bleed easily.  Psychiatric/Behavioral:  Negative for depression, sleep disturbance and suicidal ideas. The patient is not nervous/anxious.   All other systems reviewed  and are  negative.     VITALS:   There were no vitals taken for this visit.  Wt Readings from Last 3 Encounters:  06/14/23 296 lb 9.6 oz (134.5 kg)  06/01/23 295 lb (133.8 kg)  05/21/23 291 lb 8 oz (132.2 kg)    There is no height or weight on file to calculate BMI.  Performance status (ECOG): 1 - Symptomatic but completely ambulatory  PHYSICAL EXAM:   Physical Exam Vitals and nursing note reviewed. Exam conducted with a chaperone present.  Constitutional:      Appearance: Normal appearance.  Cardiovascular:     Rate and Rhythm: Normal rate and regular rhythm.     Pulses: Normal pulses.     Heart sounds: Normal heart sounds.  Pulmonary:     Effort: Pulmonary effort is normal.     Breath sounds: Normal breath sounds.  Abdominal:     Palpations: Abdomen is soft. There is no hepatomegaly, splenomegaly or mass.     Tenderness: There is no abdominal tenderness.  Musculoskeletal:     Right lower leg: No edema.     Left lower leg: No edema.  Lymphadenopathy:     Cervical: No cervical adenopathy.     Right cervical: No superficial, deep or posterior cervical adenopathy.    Left cervical: No superficial, deep or posterior cervical adenopathy.     Upper Body:     Right upper body: No supraclavicular or axillary adenopathy.     Left upper body: No supraclavicular or axillary adenopathy.  Skin:    Findings: Erythema (on bilateral shinswith ruptured blisters) present.  Neurological:     General: No focal deficit present.     Mental Status: He is alert and oriented to person, place, and time.  Psychiatric:        Mood and Affect: Mood normal.        Behavior: Behavior normal.     LABS:      Latest Ref Rng & Units 06/14/2023    8:23 AM 06/01/2023    7:37 AM 05/15/2023    9:35 AM  CBC  WBC 4.0 - 10.5 K/uL 6.1  6.5  7.3   Hemoglobin 13.0 - 17.0 g/dL 82.9  56.2  13.0   Hematocrit 39.0 - 52.0 % 39.2  39.4  40.0   Platelets 150 - 400 K/uL 245  286  235       Latest Ref Rng & Units  06/14/2023    8:23 AM 06/01/2023    7:37 AM 05/15/2023    9:35 AM  CMP  Glucose 70 - 99 mg/dL 865  784  696   BUN 8 - 23 mg/dL 34  23  44   Creatinine 0.61 - 1.24 mg/dL 2.95  2.84  1.32   Sodium 135 - 145 mmol/L 132  137  132   Potassium 3.5 - 5.1 mmol/L 3.5  4.1  2.7   Chloride 98 - 111 mmol/L 97  98  89   CO2 22 - 32 mmol/L 27  27  29    Calcium 8.9 - 10.3 mg/dL 8.9  44.0  9.2   Total Protein 6.5 - 8.1 g/dL 6.7  6.8  6.8   Total Bilirubin 0.3 - 1.2 mg/dL 0.2  0.4  0.6   Alkaline Phos 38 - 126 U/L 71  70  84   AST 15 - 41 U/L 24  21  31    ALT 0 - 44 U/L 26  26  38  Lab Results  Component Value Date   CEA1 2.8 10/31/2022   /  CEA  Date Value Ref Range Status  10/31/2022 2.8 0.0 - 4.7 ng/mL Final    Comment:    (NOTE)                             Nonsmokers          <3.9                             Smokers             <5.6 [employer] Electrochemiluminescence Immunoassay (ECLIA) Values obtained with different assay methods or kits cannot be used interchangeably.  Results cannot be interpreted as absolute evidence of the presence or absence of malignant disease. Performed At: [facility] [address] [place], [place] [id number] Jolene Schimke MD AO:[id number]    No results found for: "PSA1" Lab Results  Component Value Date   NGE952 11 10/31/2022   No results found for: "CAN125"  No results found for: "TOTALPROTELP", "ALBUMINELP", "A1GS", "A2GS", "BETS", "BETA2SER", "GAMS", "MSPIKE", "SPEI" Lab Results  Component Value Date   TIBC 428 01/29/2023   TIBC 457 (H) 01/04/2022   FERRITIN 183 01/29/2023   FERRITIN 522 (H) 01/04/2022   IRONPCTSAT 15 01/29/2023   IRONPCTSAT 16 (L) 01/04/2022   Lab Results  Component Value Date   LDH 266 (H) 10/05/2021   LDH 242 (H) 09/01/2021     STUDIES:   MR PELVIS W WO CONTRAST  Result Date: 06/13/2023 CLINICAL DATA:  Metastatic cholangiocarcinoma with peritoneal carcinomatosis EXAM: MRI PELVIS WITHOUT AND  WITH CONTRAST TECHNIQUE: Multiplanar multisequence MR imaging of the pelvis was performed both before and after administration of intravenous contrast. CONTRAST:  10mL GADAVIST GADOBUTROL 1 MMOL/ML IV SOLN COMPARISON:  CT abdomen 03/27/2023 FINDINGS: Urinary Tract:  Unremarkable Bowel:  Descending and sigmoid colon diverticulosis. Vascular/Lymphatic: Small inguinal and external iliac lymph nodes are not pathologically enlarged by size criteria. Reproductive: Mild benign prostatic hypertrophy. Fatty bilateral spermatic cords. Other: No pelvic ascites. No definite pelvic nodularity along peritoneal surfaces. Musculoskeletal: Lipoma in between the gluteus medius and gluteus maximus muscles measuring about 9.0 by 3.9 by 15.3 cm. This may mildly medially displace the left sciatic nerve but no edema or inflammatory findings involving the sciatic nerve observed. Loss of disc height and disc desiccation at L4-5. Metal artifact from right hip prosthesis. No osseous metastatic lesions are identified in the bony pelvis or proximal femurs. No impinging lesions along the sacral plexus. IMPRESSION: 1. No findings of metastatic disease in the pelvis. 2. Lipoma in between the left gluteus medius and gluteus maximus muscles measuring about 9.0 by 3.9 by 15.3 cm. This may mildly medially displace the left sciatic nerve but no edema or inflammatory findings involving the sciatic nerve observed. 3. Descending and sigmoid colon diverticulosis. 4. Mild benign prostatic hypertrophy. 5. Degenerative disc disease at L4-5. Electronically Signed   By: Gaylyn Rong M.D.   On: 06/13/2023 18:02   MR Lumbar Spine W Wo Contrast  Result Date: 06/01/2023 CLINICAL DATA:  Cholangiocarcinoma. Peritoneal carcinomatosis. Low back pain. Metastatic disease is suspected. EXAM: MRI LUMBAR SPINE WITHOUT AND WITH CONTRAST TECHNIQUE: Multiplanar and multiecho pulse sequences of the lumbar spine were obtained without and with intravenous contrast.  CONTRAST:  10mL GADAVIST GADOBUTROL 1 MMOL/ML IV SOLN COMPARISON:  MRI lumbar spine without contrast 07/04/2013.  CT of the abdomen and pelvis 03/27/2023. FINDINGS: Segmentation: 5 non rib-bearing lumbar type vertebral bodies are present. The lowest fully formed vertebral body is L5. Alignment: No significant listhesis is present. Straightening of the normal lumbar lordosis is present. Vertebrae: Marrow signal and vertebral body heights are normal. No pathologic enhancement is present. Conus medullaris and cauda equina: Conus extends to the T12 level. Conus and cauda equina appear normal. Paraspinal and other soft tissues: Limited imaging the abdomen is unremarkable. There is no significant adenopathy. No solid organ lesions are present. Disc levels: T12-L1: Normal disc signal and height is present. No focal protrusion or stenosis is present. L1-2: Normal disc signal and height is present. No focal protrusion or stenosis is present. L2-3: Mild lateral disc bulging is present. Disc desiccation is present. Mild facet hypertrophy is present bilaterally. No focal stenosis is present. L3-4: A leftward disc protrusion and annular tear is present. Mild left foraminal stenosis is present. The central canal is patent. L4-5: Chronic loss of disc height has progressed. A broad-based disc protrusion is present. Mild facet hypertrophy is noted. Mild foraminal narrowing bilaterally is stable. L5-S1: Asymmetric right-sided facet hypertrophy is present. No significant disc protrusion or stenosis is present. IMPRESSION: 1. No evidence for metastatic disease to the lumbar spine. 2. Mild left foraminal narrowing at L3-4 secondary to a leftward disc protrusion and annular tear. 3. Mild foraminal narrowing bilaterally at L4-5 is stable. 4. Asymmetric right-sided facet hypertrophy at L5-S1 without significant disc protrusion or stenosis. Electronically Signed   By: Marin Roberts M.D.   On: 06/01/2023 15:38   CT Angio Chest PE W  and/or Wo Contrast  Result Date: 06/01/2023 CLINICAL DATA:  Shortness of breath, acute generalized abdominal pain. History hepatic cancer. EXAM: CT ANGIOGRAPHY CHEST CT ABDOMEN AND PELVIS WITH CONTRAST TECHNIQUE: Multidetector CT imaging of the chest was performed using the standard protocol during bolus administration of intravenous contrast. Multiplanar CT image reconstructions and MIPs were obtained to evaluate the vascular anatomy. Multidetector CT imaging of the abdomen and pelvis was performed using the standard protocol during bolus administration of intravenous contrast. RADIATION DOSE REDUCTION: This exam was performed according to the departmental dose-optimization program which includes automated exposure control, adjustment of the mA and/or kV according to patient size and/or use of iterative reconstruction technique. CONTRAST:  OMNIPAQUE IOHEXOL 350 MG/ML SOLN COMPARISON:  March 27, 2023. FINDINGS: CTA CHEST FINDINGS Cardiovascular: Satisfactory opacification of the pulmonary arteries to the segmental level. No evidence of pulmonary embolism. Normal heart size. No pericardial effusion. Mediastinum/Nodes: No enlarged mediastinal, hilar, or axillary lymph nodes. Thyroid gland, trachea, and esophagus demonstrate no significant findings. Lungs/Pleura: Lungs are clear. No pleural effusion or pneumothorax. Musculoskeletal: No chest wall abnormality. No acute or significant osseous findings. Review of the MIP images confirms the above findings. CT ABDOMEN and PELVIS FINDINGS Hepatobiliary: Cholelithiasis. No biliary dilatation. Hepatic steatosis. Left hepatic lesion noted on prior exam is not well visualized currently. Pancreas: Unremarkable. No pancreatic ductal dilatation or surrounding inflammatory changes. Spleen: Normal in size without focal abnormality. Adrenals/Urinary Tract: Adrenal glands appear normal. Small right renal cyst is noted for which no further follow-up is required. No  hydronephrosis or renal obstruction is noted. Urinary bladder is unremarkable. Stomach/Bowel: Stomach is within normal limits. Appendix appears normal. No evidence of bowel wall thickening, distention, or inflammatory changes. Sigmoid diverticulosis without inflammation. Vascular/Lymphatic: Aortic atherosclerosis. No enlarged abdominal or pelvic lymph nodes. Reproductive: Prostate is unremarkable. Other: No abdominal wall hernia or abnormality. No abdominopelvic ascites.  Musculoskeletal: Status post right total hip arthroplasty. No acute osseous abnormality is noted. Review of the MIP images confirms the above findings. IMPRESSION: No definite evidence of pulmonary embolus. Hepatic steatosis. Cholelithiasis. Sigmoid diverticulosis without inflammation. Aortic Atherosclerosis (ICD10-I70.0). Electronically Signed   By: Lupita Raider M.D.   On: 06/01/2023 15:34   CT ABDOMEN PELVIS W CONTRAST  Result Date: 06/01/2023 CLINICAL DATA:  Shortness of breath, acute generalized abdominal pain. History hepatic cancer. EXAM: CT ANGIOGRAPHY CHEST CT ABDOMEN AND PELVIS WITH CONTRAST TECHNIQUE: Multidetector CT imaging of the chest was performed using the standard protocol during bolus administration of intravenous contrast. Multiplanar CT image reconstructions and MIPs were obtained to evaluate the vascular anatomy. Multidetector CT imaging of the abdomen and pelvis was performed using the standard protocol during bolus administration of intravenous contrast. RADIATION DOSE REDUCTION: This exam was performed according to the departmental dose-optimization program which includes automated exposure control, adjustment of the mA and/or kV according to patient size and/or use of iterative reconstruction technique. CONTRAST:  OMNIPAQUE IOHEXOL 350 MG/ML SOLN COMPARISON:  March 27, 2023. FINDINGS: CTA CHEST FINDINGS Cardiovascular: Satisfactory opacification of the pulmonary arteries to the segmental level. No evidence of  pulmonary embolism. Normal heart size. No pericardial effusion. Mediastinum/Nodes: No enlarged mediastinal, hilar, or axillary lymph nodes. Thyroid gland, trachea, and esophagus demonstrate no significant findings. Lungs/Pleura: Lungs are clear. No pleural effusion or pneumothorax. Musculoskeletal: No chest wall abnormality. No acute or significant osseous findings. Review of the MIP images confirms the above findings. CT ABDOMEN and PELVIS FINDINGS Hepatobiliary: Cholelithiasis. No biliary dilatation. Hepatic steatosis. Left hepatic lesion noted on prior exam is not well visualized currently. Pancreas: Unremarkable. No pancreatic ductal dilatation or surrounding inflammatory changes. Spleen: Normal in size without focal abnormality. Adrenals/Urinary Tract: Adrenal glands appear normal. Small right renal cyst is noted for which no further follow-up is required. No hydronephrosis or renal obstruction is noted. Urinary bladder is unremarkable. Stomach/Bowel: Stomach is within normal limits. Appendix appears normal. No evidence of bowel wall thickening, distention, or inflammatory changes. Sigmoid diverticulosis without inflammation. Vascular/Lymphatic: Aortic atherosclerosis. No enlarged abdominal or pelvic lymph nodes. Reproductive: Prostate is unremarkable. Other: No abdominal wall hernia or abnormality. No abdominopelvic ascites. Musculoskeletal: Status post right total hip arthroplasty. No acute osseous abnormality is noted. Review of the MIP images confirms the above findings. IMPRESSION: No definite evidence of pulmonary embolus. Hepatic steatosis. Cholelithiasis. Sigmoid diverticulosis without inflammation. Aortic Atherosclerosis (ICD10-I70.0). Electronically Signed   By: Lupita Raider M.D.   On: 06/01/2023 15:34   DG Chest Port 1 View  Result Date: 06/01/2023 CLINICAL DATA:  shob, ascites complains EXAM: PORTABLE CHEST 1 VIEW COMPARISON:  CXR 02/28/22 FINDINGS: Right-sided chest port with unchanged  positioning. No pleural effusion. No pneumothorax. Left basilar atelectasis. Normal cardiac and mediastinal contours. No radiographically apparent displaced rib fractures. Visualized upper abdomen unremarkable. IMPRESSION: Left basilar atelectasis. Electronically Signed   By: Lorenza Cambridge M.D.   On: 06/01/2023 08:48

## 2023-06-14 ENCOUNTER — Inpatient Hospital Stay: Payer: Medicare Other | Attending: Hematology

## 2023-06-14 ENCOUNTER — Inpatient Hospital Stay (HOSPITAL_BASED_OUTPATIENT_CLINIC_OR_DEPARTMENT_OTHER): Payer: Medicare Other | Admitting: Hematology

## 2023-06-14 ENCOUNTER — Inpatient Hospital Stay: Payer: Medicare Other

## 2023-06-14 ENCOUNTER — Telehealth: Payer: Self-pay | Admitting: Neurology

## 2023-06-14 VITALS — BP 118/74 | HR 81 | Temp 97.9°F | Resp 18

## 2023-06-14 DIAGNOSIS — C786 Secondary malignant neoplasm of retroperitoneum and peritoneum: Secondary | ICD-10-CM

## 2023-06-14 DIAGNOSIS — E876 Hypokalemia: Secondary | ICD-10-CM | POA: Insufficient documentation

## 2023-06-14 DIAGNOSIS — Z8551 Personal history of malignant neoplasm of bladder: Secondary | ICD-10-CM | POA: Diagnosis not present

## 2023-06-14 DIAGNOSIS — M545 Low back pain, unspecified: Secondary | ICD-10-CM | POA: Insufficient documentation

## 2023-06-14 DIAGNOSIS — Z79620 Long term (current) use of immunosuppressive biologic: Secondary | ICD-10-CM | POA: Insufficient documentation

## 2023-06-14 DIAGNOSIS — Z95828 Presence of other vascular implants and grafts: Secondary | ICD-10-CM

## 2023-06-14 DIAGNOSIS — C221 Intrahepatic bile duct carcinoma: Secondary | ICD-10-CM | POA: Insufficient documentation

## 2023-06-14 DIAGNOSIS — G629 Polyneuropathy, unspecified: Secondary | ICD-10-CM | POA: Insufficient documentation

## 2023-06-14 DIAGNOSIS — Z5112 Encounter for antineoplastic immunotherapy: Secondary | ICD-10-CM | POA: Diagnosis present

## 2023-06-14 DIAGNOSIS — G479 Sleep disorder, unspecified: Secondary | ICD-10-CM | POA: Diagnosis not present

## 2023-06-14 DIAGNOSIS — R609 Edema, unspecified: Secondary | ICD-10-CM | POA: Diagnosis not present

## 2023-06-14 DIAGNOSIS — Z79899 Other long term (current) drug therapy: Secondary | ICD-10-CM | POA: Insufficient documentation

## 2023-06-14 DIAGNOSIS — R0602 Shortness of breath: Secondary | ICD-10-CM | POA: Diagnosis not present

## 2023-06-14 DIAGNOSIS — Z87891 Personal history of nicotine dependence: Secondary | ICD-10-CM | POA: Diagnosis not present

## 2023-06-14 LAB — COMPREHENSIVE METABOLIC PANEL
ALT: 26 U/L (ref 0–44)
AST: 24 U/L (ref 15–41)
Albumin: 3.7 g/dL (ref 3.5–5.0)
Alkaline Phosphatase: 71 U/L (ref 38–126)
Anion gap: 8 (ref 5–15)
BUN: 34 mg/dL — ABNORMAL HIGH (ref 8–23)
CO2: 27 mmol/L (ref 22–32)
Calcium: 8.9 mg/dL (ref 8.9–10.3)
Chloride: 97 mmol/L — ABNORMAL LOW (ref 98–111)
Creatinine, Ser: 1.14 mg/dL (ref 0.61–1.24)
GFR, Estimated: >60 mL/min (ref >60)
Glucose, Bld: 171 mg/dL — ABNORMAL HIGH (ref 70–99)
Potassium: 3.5 mmol/L (ref 3.5–5.1)
Sodium: 132 mmol/L — ABNORMAL LOW (ref 135–145)
Total Bilirubin: 0.2 mg/dL — ABNORMAL LOW (ref 0.3–1.2)
Total Protein: 6.7 g/dL (ref 6.5–8.1)

## 2023-06-14 LAB — CBC WITH DIFFERENTIAL/PLATELET
Abs Immature Granulocytes: 0.04 10*3/uL (ref 0.00–0.07)
Basophils Absolute: 0 10*3/uL (ref 0.0–0.1)
Basophils Relative: 1 %
Eosinophils Absolute: 0.2 10*3/uL (ref 0.0–0.5)
Eosinophils Relative: 3 %
HCT: 39.2 % (ref 39.0–52.0)
Hemoglobin: 12.8 g/dL — ABNORMAL LOW (ref 13.0–17.0)
Immature Granulocytes: 1 %
Lymphocytes Relative: 23 %
Lymphs Abs: 1.4 10*3/uL (ref 0.7–4.0)
MCH: 29.6 pg (ref 26.0–34.0)
MCHC: 32.7 g/dL (ref 30.0–36.0)
MCV: 90.5 fL (ref 80.0–100.0)
Monocytes Absolute: 0.5 10*3/uL (ref 0.1–1.0)
Monocytes Relative: 9 %
Neutro Abs: 3.9 10*3/uL (ref 1.7–7.7)
Neutrophils Relative %: 63 %
Platelets: 245 10*3/uL (ref 150–400)
RBC: 4.33 MIL/uL (ref 4.22–5.81)
RDW: 13.4 % (ref 11.5–15.5)
WBC: 6.1 10*3/uL (ref 4.0–10.5)
nRBC: 0 % (ref 0.0–0.2)

## 2023-06-14 LAB — MAGNESIUM: Magnesium: 1.8 mg/dL (ref 1.7–2.4)

## 2023-06-14 MED ORDER — SODIUM CHLORIDE 0.9% FLUSH
10.0000 mL | Freq: Once | INTRAVENOUS | Status: AC
  Administered 2023-06-14: 10 mL via INTRAVENOUS

## 2023-06-14 MED ORDER — SODIUM CHLORIDE 0.9% FLUSH
10.0000 mL | INTRAVENOUS | Status: DC | PRN
  Administered 2023-06-14: 10 mL

## 2023-06-14 MED ORDER — SODIUM CHLORIDE 0.9 % IV SOLN
1500.0000 mg | Freq: Once | INTRAVENOUS | Status: AC
  Administered 2023-06-14: 1500 mg via INTRAVENOUS
  Filled 2023-06-14: qty 30

## 2023-06-14 MED ORDER — SODIUM CHLORIDE 0.9 % IV SOLN: Freq: Once | INTRAVENOUS | Status: AC

## 2023-06-14 MED ORDER — HEPARIN SOD (PORK) LOCK FLUSH 100 UNIT/ML IV SOLN
500.0000 [IU] | Freq: Once | INTRAVENOUS | Status: AC | PRN
  Administered 2023-06-14: 500 [IU]

## 2023-06-14 MED ORDER — PRAMIPEXOLE DIHYDROCHLORIDE 0.25 MG PO TABS
0.7500 mg | ORAL_TABLET | Freq: Once | ORAL | Status: AC
  Administered 2023-06-14: 0.75 mg via ORAL
  Filled 2023-06-14: qty 3

## 2023-06-14 NOTE — Progress Notes (Signed)
Patient has been examined by Dr. Katragadda. Vital signs and labs have been reviewed by MD - ANC, Creatinine, LFTs, hemoglobin, and platelets are within treatment parameters per M.D. - pt may proceed with treatment.  Primary RN and pharmacy notified.  

## 2023-06-14 NOTE — Progress Notes (Signed)
Patient presents today for chemotherapy infusion of Imfinzi. Patient is in satisfactory condition with no new complaints voiced.  Vital signs are stable.  Labs reviewed by Dr. Ellin Saba during the office visit and all labs are within treatment parameters.  We will proceed with treatment per MD orders.   Patient requesting something for his Restless Leg Syndrome while he is here. Per patient increasing getting worse since his arrival here. Patient takes Mirapex 0.75 but has not taken it today and did not bring any with him. Dr Ellin Saba made aware, order given for Mirapex 0.75.

## 2023-06-14 NOTE — Patient Instructions (Signed)

## 2023-06-14 NOTE — Patient Instructions (Signed)
[facility]  Discharge Instructions: Thank you for choosing [facility] to provide your oncology and hematology care.  If you have a lab appointment with the Cancer Center - please note that after April 8th, 2024, all labs will be drawn in the cancer center.  You do not have to check in or register with the main entrance as you have in the past but will complete your check-in in the cancer center.  Wear comfortable clothing and clothing appropriate for easy access to any Portacath or PICC line.   We strive to give you quality time with your provider. You may need to reschedule your appointment if you arrive late (15 or more minutes).  Arriving late affects you and other patients whose appointments are after yours.  Also, if you miss three or more appointments without notifying the office, you may be dismissed from the clinic at the provider's discretion.      For prescription refill requests, have your pharmacy contact our office and allow 72 hours for refills to be completed.    Today you received the following chemotherapy and/or immunotherapy agents Imfinzi.  Durvalumab Injection What is this medication? DURVALUMAB (dur VAL ue mab) treats some types of cancer. It works by helping your immune system slow or stop the spread of cancer cells. It is a monoclonal antibody. This medicine may be used for other purposes; ask your health care provider or pharmacist if you have questions. COMMON BRAND NAME(S): IMFINZI What should I tell my care team before I take this medication? They need to know if you have any of these conditions: Allogeneic stem cell transplant (uses someone else's stem cells) Autoimmune diseases, such as Crohn disease, ulcerative colitis, lupus History of chest radiation Nervous system problems, such as Guillain-Barre syndrome, myasthenia gravis Organ transplant An unusual or allergic reaction to durvalumab, other medications, foods, dyes, or  preservatives Pregnant or trying to get pregnant Breast-feeding How should I use this medication? This medication is infused into a vein. It is given by your care team in a hospital or clinic setting. A special MedGuide will be given to you before each treatment. Be sure to read this information carefully each time. Talk to your care team about the use of this medication in children. Special care may be needed. Overdosage: If you think you have taken too much of this medicine contact a poison control center or emergency room at once. NOTE: This medicine is only for you. Do not share this medicine with others. What if I miss a dose? Keep appointments for follow-up doses. It is important not to miss your dose. Call your care team if you are unable to keep an appointment. What may interact with this medication? Interactions have not been studied. This list may not describe all possible interactions. Give your health care provider a list of all the medicines, herbs, non-prescription drugs, or dietary supplements you use. Also tell them if you smoke, drink alcohol, or use illegal drugs. Some items may interact with your medicine. What should I watch for while using this medication? Your condition will be monitored carefully while you are receiving this medication. You may need blood work while taking this medication. This medication may cause serious skin reactions. They can happen weeks to months after starting the medication. Contact your care team right away if you notice fevers or flu-like symptoms with a rash. The rash may be red or purple and then turn into blisters or peeling of   the skin. You may also notice a red rash with swelling of the face, lips, or lymph nodes in your neck or under your arms. Tell your care team right away if you have any change in your eyesight. Talk to your care team if you may be pregnant. Serious birth defects can occur if you take this medication during pregnancy and  for 3 months after the last dose. You will need a negative pregnancy test before starting this medication. Contraception is recommended while taking this medication and for 3 months after the last dose. Your care team can help you find the option that works for you. Do not breastfeed while taking this medication and for 3 months after the last dose. What side effects may I notice from receiving this medication? Side effects that you should report to your care team as soon as possible: Allergic reactions--skin rash, itching, hives, swelling of the face, lips, tongue, or throat Dry cough, shortness of breath or trouble breathing Eye pain, redness, irritation, or discharge with blurry or decreased vision Heart muscle inflammation--unusual weakness or fatigue, shortness of breath, chest pain, fast or irregular heartbeat, dizziness, swelling of the ankles, feet, or hands Hormone gland problems--headache, sensitivity to light, unusual weakness or fatigue, dizziness, fast or irregular heartbeat, increased sensitivity to cold or heat, excessive sweating, constipation, hair loss, increased thirst or amount of urine, tremors or shaking, irritability Infusion reactions--chest pain, shortness of breath or trouble breathing, feeling faint or lightheaded Kidney injury (glomerulonephritis)--decrease in the amount of urine, red or dark brown urine, foamy or bubbly urine, swelling of the ankles, hands, or feet Liver injury--right upper belly pain, loss of appetite, nausea, light-colored stool, dark yellow or brown urine, yellowing skin or eyes, unusual weakness or fatigue Pain, tingling, or numbness in the hands or feet, muscle weakness, change in vision, confusion or trouble speaking, loss of balance or coordination, trouble walking, seizures Rash, fever, and swollen lymph nodes Redness, blistering, peeling, or loosening of the skin, including inside the mouth Sudden or severe stomach pain, bloody diarrhea, fever,  nausea, vomiting Side effects that usually do not require medical attention (report these to your care team if they continue or are bothersome): Bone, joint, or muscle pain Diarrhea Fatigue Loss of appetite Nausea Skin rash This list may not describe all possible side effects. Call your doctor for medical advice about side effects. You may report side effects to FDA at 1-800-FDA-1088. Where should I keep my medication? This medication is given in a hospital or clinic. It will not be stored at home. NOTE: This sheet is a summary. It may not cover all possible information. If you have questions about this medicine, talk to your doctor, pharmacist, or health care provider.  2024 Elsevier/Gold Standard (2022-01-10 00:00:00)       To help prevent nausea and vomiting after your treatment, we encourage you to take your nausea medication as directed.  BELOW ARE SYMPTOMS THAT SHOULD BE REPORTED IMMEDIATELY: *FEVER GREATER THAN 100.4 F (38 C) OR HIGHER *CHILLS OR SWEATING *NAUSEA AND VOMITING THAT IS NOT CONTROLLED WITH YOUR NAUSEA MEDICATION *UNUSUAL SHORTNESS OF BREATH *UNUSUAL BRUISING OR BLEEDING *URINARY PROBLEMS (pain or burning when urinating, or frequent urination) *BOWEL PROBLEMS (unusual diarrhea, constipation, pain near the anus) TENDERNESS IN MOUTH AND THROAT WITH OR WITHOUT PRESENCE OF ULCERS (sore throat, sores in mouth, or a toothache) UNUSUAL RASH, SWELLING OR PAIN  UNUSUAL VAGINAL DISCHARGE OR ITCHING   Items with * indicate a potential emergency and should be followed   up as soon as possible or go to the Emergency Department if any problems should occur.  Please show the CHEMOTHERAPY ALERT CARD or IMMUNOTHERAPY ALERT CARD at check-in to the Emergency Department and triage nurse.  Should you have questions after your visit or need to cancel or reschedule your appointment, please contact [facility] [phone]  and follow the prompts.  Office hours are  8:00 a.m. to 4:30 p.m. Monday - Friday. Please note that voicemails left after 4:00 p.m. may not be returned until the following business day.  We are closed weekends and major holidays. You have access to a nurse at all times for urgent questions. Please call the main number to the clinic [phone] and follow the prompts.  For any non-urgent questions, you may also contact your provider using MyChart. We now offer e-Visits for anyone 18 and older to request care online for non-urgent symptoms. For details visit [email or web address].   Also download the MyChart app! Go to the app store, search "MyChart", open the app, select [facility], and log in with your MyChart username and password.   

## 2023-06-14 NOTE — Telephone Encounter (Signed)
Received the ONO on CPAP report from [facility]. Pt completed the test on  06/12/2023 -06/13/2023 for 9 hours 21 min and 51 sec. I will have Dr Dohmeier review the results and let me know her thoughts.

## 2023-06-19 ENCOUNTER — Encounter: Payer: Self-pay | Admitting: Neurology

## 2023-06-25 ENCOUNTER — Other Ambulatory Visit: Payer: Self-pay | Admitting: *Deleted

## 2023-06-25 ENCOUNTER — Encounter: Payer: Self-pay | Admitting: Hematology

## 2023-06-25 MED ORDER — POTASSIUM CHLORIDE CRYS ER 20 MEQ PO TBCR: 20.0000 meq | EXTENDED_RELEASE_TABLET | Freq: Every day | ORAL | 3 refills | Status: DC

## 2023-06-27 ENCOUNTER — Other Ambulatory Visit (HOSPITAL_COMMUNITY)
Admission: RE | Admit: 2023-06-27 | Discharge: 2023-06-27 | Disposition: A | Discharge status: Home / Self Care | Payer: Medicare Other | Source: Ambulatory Visit | Attending: Oncology | Admitting: Oncology

## 2023-06-27 DIAGNOSIS — Z006 Encounter for examination for normal comparison and control in clinical research program: Secondary | ICD-10-CM | POA: Insufficient documentation

## 2023-06-28 ENCOUNTER — Other Ambulatory Visit: Payer: Self-pay

## 2023-07-03 ENCOUNTER — Ambulatory Visit (INDEPENDENT_AMBULATORY_CARE_PROVIDER_SITE_OTHER): Payer: Medicare Other | Admitting: Neurology

## 2023-07-03 ENCOUNTER — Encounter: Payer: Self-pay | Admitting: Neurology

## 2023-07-03 VITALS — BP 124/79 | HR 73 | Ht 72.0 in | Wt 294.0 lb

## 2023-07-03 DIAGNOSIS — C221 Intrahepatic bile duct carcinoma: Secondary | ICD-10-CM

## 2023-07-03 DIAGNOSIS — L0390 Cellulitis, unspecified: Secondary | ICD-10-CM

## 2023-07-03 DIAGNOSIS — G620 Drug-induced polyneuropathy: Secondary | ICD-10-CM

## 2023-07-03 DIAGNOSIS — C787 Secondary malignant neoplasm of liver and intrahepatic bile duct: Secondary | ICD-10-CM

## 2023-07-03 DIAGNOSIS — T451X5A Adverse effect of antineoplastic and immunosuppressive drugs, initial encounter: Secondary | ICD-10-CM | POA: Insufficient documentation

## 2023-07-03 DIAGNOSIS — G4733 Obstructive sleep apnea (adult) (pediatric): Secondary | ICD-10-CM | POA: Diagnosis not present

## 2023-07-03 DIAGNOSIS — A419 Sepsis, unspecified organism: Secondary | ICD-10-CM

## 2023-07-03 NOTE — Patient Instructions (Addendum)
I would like to thank [name], Shawnie Dapper, NP and Benita Stabile, MD and  for allowing me to meet with and to take care of this pleasant patient.   OSA, severe and new on CPAP, now improved compliance with nasal interface. Has facial hair. His residual AHI was 1.7/h  from 5.5/h on FFM  Improved sleepiness, decreased fatigue.  CPAP and decreasing doses of Gabapentin have contributed.   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 It also causes  dizziness, swaying when standing with his eyes closed.  He has some hearing loss, reports tinnitus.     Sleep follow up through our NP every 12 months.

## 2023-07-03 NOTE — Progress Notes (Signed)
Provider:  Melvyn Novas, MD  Primary Care Physician:  Benita Stabile, MD [address] [place] [address]     Referring Provider: [name]. Shawnie Dapper, NP          Chief Complaint according to patient   Patient presents with:                HISTORY OF PRESENT ILLNESS:  Marvin Carr is a 67 y.o. male patient who is here for revisit 07/03/2023 for first visit on new CPAP.   Last seen by Shawnie Dapper, NP 05-21-2023.   Chief concern according to patient :  I got finally used to it, switched from [facility] to nasal pillow.  He has just passed the 4 hour mark, but has used the machine 30-30 days.   He is less sleepy but his Epworth score is still high : 16/ 24 . 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   05/21/23 ALL:  Marvin Carr is a 67 y.o. male here today for follow up for OSA on CPAP. Previously failed CPAP years ago. He is followed by [name] for RLS on pramapexiole and gabapentin. He was seen in consult with Dr Vickey Huger 02/2023. HST showed severe OSA with total AHI 75.7/hr and O2 nadir of 75%. He started therapy 04/12/2023. He has had difficulty tolerating therapy. He is using a [employer] style FFM. He has tried nasal pillow style mask in the past. He wakes often to use restroom. He has nocturia and edema related to chemotherapy. He has an oxygen concentrator from [employer] that he uses with CPAP. He feels that he does better with oxygen. He is able to get a full deep breath. He continues zaleplon 10mg  every night. He takes tramadol 4 times daily for back pain.    He does feel RLS has improved. He continues Mirapex 3-4 times daily. He tried switching gabapentin to 1200mg  TID but felt off. He has titrated off of gabapentin. He does not feel it is needed at this time.            Review of Systems: Out of a complete 14 system review, the patient complains of only the following symptoms, and all other reviewed  systems are negative.:  Fatigue, sleepiness , snoring, now improved on CPAP , Insomnia, RLS, neuropathy,  tinnitus.    How likely are you to doze in the following situations: 0 = not likely, 1 = slight chance, 2 = moderate chance, 3 = high chance   Sitting and Reading? Watching Television? Sitting inactive in a public place (theater or meeting)? As a passenger in a car for an hour without a break? Lying down in the afternoon when circumstances permit? Sitting and talking to someone? Sitting quietly after lunch without alcohol? In a car, while stopped for a few minutes in traffic?   Total = 16/ 24 points   FSS endorsed at 35/ 63 points.  GDS 4/ 15 points.   Social History   Socioeconomic History   Marital status: Divorced    Spouse name: Not on file   Number of children: 0   Years of education: college   Highest education level: Not on file  Occupational History    Employer: SELF-EMPLOYED  Tobacco Use   Smoking status: Former    Current packs/day: 0.00    Average packs/day: 0.5 packs/day for 10.0 years (5.0 ttl  pk-yrs)    Types: Cigarettes    Start date: 07/09/2001    Quit date: 07/10/2011    Years since quitting: 11.9   Smokeless tobacco: Never   Tobacco comments:    Quit several yrs prior to 03/2013.  Vaping Use   Vaping status: Never Used  Substance and Sexual Activity   Alcohol use: Yes    Alcohol/week: 0.0 standard drinks of alcohol    Comment: Occasional   Drug use: No   Sexual activity: Not on file  Other Topics Concern   Not on file  Social History Narrative   Not on file   Social Determinants of Health   Financial Resource Strain: Not on file  Food Insecurity: No Food Insecurity (06/05/2022)   Hunger Vital Sign    Worried About Running Out of Food in the Last Year: Never true    Ran Out of Food in the Last Year: Never true  Transportation Needs: No Transportation Needs (06/05/2022)   PRAPARE - Administrator, Civil Service (Medical): No     Lack of Transportation (Non-Medical): No  Physical Activity: Not on file  Stress: Not on file  Social Connections: Not on file    Family History  Problem Relation Age of Onset   COPD Father     Past Medical History:  Diagnosis Date   Anxiety    Bladder cancer (HCC) 09/11/2009   CAD (coronary artery disease), native coronary artery    a. 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    Obesity    Port-A-Cath in place 09/30/2021   Pre-diabetes, DM2 -     Seasonal allergies    Sleep apnea OSA    On CPAP    Past Surgical History:  Procedure Laterality Date   BIOPSY  09/23/2021   Procedure: BIOPSY;  Surgeon: Dolores Frame, MD;  [facility];  Service: Gastroenterology;;   COLONOSCOPY  06/28/2011   Procedure: COLONOSCOPY;  Surgeon: Malissa Hippo, MD;  [facility];  Service: Endoscopy;  Laterality: N/A;   COLONOSCOPY WITH PROPOFOL N/A 09/23/2021   Procedure: COLONOSCOPY WITH PROPOFOL;  Surgeon: Dolores Frame, MD;  [facility];  Service: Gastroenterology;  Laterality: N/A;  940   ESOPHAGOGASTRODUODENOSCOPY (EGD) WITH PROPOFOL N/A 09/23/2021   Procedure: ESOPHAGOGASTRODUODENOSCOPY (EGD) WITH PROPOFOL;  Surgeon: Dolores Frame, MD;  [facility];  Service: Gastroenterology;  Laterality: N/A;   IR IMAGING GUIDED PORT INSERTION  09/28/2021   IR PARACENTESIS  09/28/2021   JOINT REPLACEMENT Right    hip   LEFT HEART CATHETERIZATION WITH CORONARY ANGIOGRAM N/A 03/31/2013   Procedure: LEFT HEART CATHETERIZATION WITH CORONARY ANGIOGRAM;  Surgeon: [name], MD;  [facility];  Service: Cardiovascular;  Laterality: N/A;   POLYPECTOMY  09/23/2021   Procedure: POLYPECTOMY;  Surgeon:  Marguerita Merles, Reuel Boom, MD;  [facility]: [facility];  Service: Gastroenterology;;   TURBT  09/11/2009     Current Outpatient Medications on File Prior to Visit  Medication Sig Dispense Refill   ALPRAZolam (XANAX) 0.5 MG tablet Take 1 tablet (0.5 mg total) by mouth at bedtime as needed for anxiety. 3 tablet 0   amLODipine (NORVASC) 5 MG tablet Take 5 mg by mouth daily.  aspirin EC 81 MG tablet Take 81 mg by mouth daily.     bumetanide (BUMEX) 2 MG tablet TAKE (1) TABLET BY MOUTH EACH MORNING. 30 tablet 2   cyclobenzaprine (FLEXERIL) 10 MG tablet Take 10 mg by mouth at bedtime as needed for muscle spasms.     Durvalumab (IMFINZI IV) Inject into the vein every 21 ( twenty-one) days.     Eszopiclone 3 MG TABS Take 3 mg by mouth at bedtime. Take immediately before bedtime     fenofibrate 160 MG tablet Take 160 mg by mouth daily.     magnesium oxide (MAG-OX) 400 (240 Mg) MG tablet Take 1 tablet (400 mg total) by mouth 2 (two) times daily. (Patient taking differently: Take 400 mg by mouth 3 (three) times daily.) 90 tablet 6   metFORMIN (GLUCOPHAGE-XR) 500 MG 24 hr tablet Take 500 mg by mouth 2 (two) times daily.     metolazone (ZAROXOLYN) 5 MG tablet Take 1 tablet (5 mg total) by mouth 3 (three) times a week. 30 tablet 4   nitroGLYCERIN (NITROSTAT) 0.4 MG SL tablet Place 1 tablet (0.4 mg total) under the tongue every 5 (five) minutes as needed for chest pain. 25 tablet 3   olmesartan (BENICAR) 40 MG tablet TAKE (1) TABLET BY MOUTH ONCE DAILY. 30 tablet 5   pantoprazole (PROTONIX) 40 MG tablet Take 1 tablet (40 mg total) by mouth daily. 90 tablet 0   potassium chloride SA (KLOR-CON M) 20 MEQ tablet Take 1 tablet (20 mEq total) by mouth daily. 60 tablet 3   pramipexole (MIRAPEX) 0.75 MG tablet Take 1 tablet (0.75 mg total) by mouth 3 (three) times daily. 90 tablet 0   RYBELSUS 7 MG TABS Take 14 mg by mouth daily.     tamsulosin (FLOMAX) 0.4 MG CAPS capsule Take 0.4 mg by mouth daily.      traMADol (ULTRAM) 50 MG tablet Take 50 mg by mouth 4 (four) times daily as needed for moderate pain.     zaleplon (SONATA) 10 MG capsule Take 1 capsule (10 mg total) by mouth at bedtime as needed for sleep. 30 capsule 5   [DISCONTINUED] prochlorperazine (COMPAZINE) 10 MG tablet Take 1 tablet (10 mg total) by mouth every 6 (six) hours as needed (Nausea or vomiting). 30 tablet 1   Current Facility-Administered Medications on File Prior to Visit  Medication Dose Route Frequency Provider Last Rate Last Admin   magnesium sulfate 2 GM/50ML IVPB             No Known Allergies   DIAGNOSTIC DATA (LABS, IMAGING, TESTING) - I reviewed patient records, labs, notes, testing and imaging myself where available.  Lab Results  Component Value Date   WBC 6.1 06/14/2023   HGB 12.8 (L) 06/14/2023   HCT 39.2 06/14/2023   MCV 90.5 06/14/2023   PLT 245 06/14/2023      Component Value Date/Time   NA 132 (L) 06/14/2023 0823   K 3.5 06/14/2023 0823   CL 97 (L) 06/14/2023 0823   CO2 27 06/14/2023 0823   GLUCOSE 171 (H) 06/14/2023 0823   BUN 34 (H) 06/14/2023 0823   CREATININE 1.14 06/14/2023 0823   CALCIUM 8.9 06/14/2023 0823   PROT 6.7 06/14/2023 0823   ALBUMIN 3.7 06/14/2023 0823   AST 24 06/14/2023 0823   ALT 26 06/14/2023 0823   ALKPHOS 71 06/14/2023 0823   BILITOT 0.2 (L) 06/14/2023 0823   GFRNONAA >60 06/14/2023 0823   GFRAA >90 06/11/2014 1610  Lab Results  Component Value Date   CHOL 121 07/22/2014   HDL 35 (L) 07/22/2014   LDLCALC 52 07/22/2014   TRIG 169 (H) 07/22/2014   CHOLHDL 3.5 07/22/2014   Lab Results  Component Value Date   HGBA1C 5.1 06/04/2022   Lab Results  Component Value Date   VITAMINB12 2,800 (H) 01/04/2022   Lab Results  Component Value Date   TSH 3.018 05/01/2023    PHYSICAL EXAM:  Today's Vitals   07/03/23 0803  BP: 124/79  Pulse: 73  Weight: 294 lb (133.4 kg)  Height: 6' (1.829 m)   Body mass index is 39.87 kg/m.   Wt Readings from Last 3  Encounters:  07/03/23 294 lb (133.4 kg)  06/14/23 296 lb 9.6 oz (134.5 kg)  06/01/23 295 lb (133.8 kg)     Ht Readings from Last 3 Encounters:  07/03/23 6' (1.829 m)  06/01/23 6' (1.829 m)  05/21/23 6' (1.829 m)      General: The patient is awake, alert and appears not in acute distress. The patient is well groomed. Head: Normocephalic, atraumatic.  Neck is supple. Mallampati 3,  neck circumference:19 inches . Nasal airflow fully patent.  Dental status: biological  Cardiovascular:  Regular rate and cardiac rhythm by pulse,  without distended neck veins. Respiratory: Lungs are clear to auscultation.  Skin:  With evidence of ankle edema, and red lower leg rash- scaling dermatitis-  Trunk: heavy set. The patient's posture is erect.   NEUROLOGIC EXAM: The patient is awake and alert, oriented to place and time.   Memory subjective described as intact.  Attention span & concentration ability appears normal.  Speech is fluent,  without  dysarthria, dysphonia or aphasia.  Mood and affect are appropriate.   Cranial nerves: no loss of smell or taste reported  Pupils are equal and briskly reactive to light. Funduscopic exam deferred.  Extraocular movements in vertical and horizontal planes were intact and without nystagmus. No Diplopia. Visual fields by finger perimetry are intact. Hearing was impaired by tinnitus-reduced  to soft voice and finger rubbing.   Facial sensation intact to fine touch. Facial motor strength is symmetric and tongue and uvula move midline.  Neck ROM : rotation, tilt and flexion extension were normal for age and shoulder shrug was symmetrical.    Motor exam:  Symmetric bulk, tone and ROM.   Normal tone without cog wheeling, symmetric grip strength .   Sensory:  Fine touch vibration were ABSENT  AT THE ANKLE  Proprioception tested in the upper extremities was normal.   Coordination: Rapid alternating movements in the fingers/hands were of normal speed.  The  Finger-to-nose maneuver was intact without evidence of ataxia, dysmetria or tremor.      ASSESSMENT AND PLAN 67 y.o. year old male  here with:    1) OSA, severe and new on CPAP, now improved compliance with nasal interface. Has facial hair. His residual AHI was 1.7/h  from 5.5/h on FFM  Improved sleepiness, decreased fatigue.  CPAP and decreasing doses of Gabapentin have contributed.   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 It also causes  dizziness, swaying when standing with his eyes closed.  He has some hearing loss, reports tinnitus.     Sleep follow up through our NP every 12 months.   I would like to thank [name], Shawnie Dapper, NP and Benita Stabile, MD and  for allowing me to meet with and to take care of this pleasant patient.  After spending a total time of  28  minutes face to face and additional time for physical and neurologic examination, review of laboratory studies,  personal review of imaging studies, reports and results of other testing and review of referral information / records as far as provided in visit,   Electronically signed by: Melvyn Novas, MD 07/03/2023 8:24 AM  [facility] Board certified by The [employer] of Sleep Medicine and Diplomate of the [employer] of Sleep Medicine. Board certified In Neurology through the ABPN, Fellow of the [employer] of Neu[employer].

## 2023-07-04 ENCOUNTER — Other Ambulatory Visit: Payer: Self-pay

## 2023-07-06 LAB — HELIX MOLECULAR SCREEN: Genetic Analysis Overall Interpretation: NEGATIVE

## 2023-07-12 ENCOUNTER — Inpatient Hospital Stay: Payer: Medicare Other | Admitting: Hematology

## 2023-07-12 ENCOUNTER — Other Ambulatory Visit: Payer: Self-pay | Admitting: *Deleted

## 2023-07-12 ENCOUNTER — Inpatient Hospital Stay: Payer: Medicare Other

## 2023-07-12 VITALS — BP 112/61 | HR 70 | Temp 97.9°F | Resp 18

## 2023-07-12 DIAGNOSIS — Z95828 Presence of other vascular implants and grafts: Secondary | ICD-10-CM

## 2023-07-12 DIAGNOSIS — C221 Intrahepatic bile duct carcinoma: Secondary | ICD-10-CM

## 2023-07-12 DIAGNOSIS — Z5112 Encounter for antineoplastic immunotherapy: Secondary | ICD-10-CM | POA: Diagnosis not present

## 2023-07-12 LAB — CBC WITH DIFFERENTIAL/PLATELET
Abs Immature Granulocytes: 0.02 10*3/uL (ref 0.00–0.07)
Basophils Absolute: 0 10*3/uL (ref 0.0–0.1)
Basophils Relative: 1 %
Eosinophils Absolute: 0.3 10*3/uL (ref 0.0–0.5)
Eosinophils Relative: 6 %
HCT: 35.1 % — ABNORMAL LOW (ref 39.0–52.0)
Hemoglobin: 11.5 g/dL — ABNORMAL LOW (ref 13.0–17.0)
Immature Granulocytes: 0 %
Lymphocytes Relative: 30 %
Lymphs Abs: 1.4 10*3/uL (ref 0.7–4.0)
MCH: 29.9 pg (ref 26.0–34.0)
MCHC: 32.8 g/dL (ref 30.0–36.0)
MCV: 91.4 fL (ref 80.0–100.0)
Monocytes Absolute: 0.6 10*3/uL (ref 0.1–1.0)
Monocytes Relative: 13 %
Neutro Abs: 2.3 10*3/uL (ref 1.7–7.7)
Neutrophils Relative %: 50 %
Platelets: 256 10*3/uL (ref 150–400)
RBC: 3.84 MIL/uL — ABNORMAL LOW (ref 4.22–5.81)
RDW: 14.1 % (ref 11.5–15.5)
WBC: 4.6 10*3/uL (ref 4.0–10.5)
nRBC: 0 % (ref 0.0–0.2)

## 2023-07-12 LAB — COMPREHENSIVE METABOLIC PANEL
ALT: 36 U/L (ref 0–44)
AST: 34 U/L (ref 15–41)
Albumin: 3.2 g/dL — ABNORMAL LOW (ref 3.5–5.0)
Alkaline Phosphatase: 64 U/L (ref 38–126)
Anion gap: 10 (ref 5–15)
BUN: 37 mg/dL — ABNORMAL HIGH (ref 8–23)
CO2: 25 mmol/L (ref 22–32)
Calcium: 8.9 mg/dL (ref 8.9–10.3)
Chloride: 100 mmol/L (ref 98–111)
Creatinine, Ser: 1.33 mg/dL — ABNORMAL HIGH (ref 0.61–1.24)
GFR, Estimated: 58 mL/min — ABNORMAL LOW (ref >60)
Glucose, Bld: 131 mg/dL — ABNORMAL HIGH (ref 70–99)
Potassium: 3.3 mmol/L — ABNORMAL LOW (ref 3.5–5.1)
Sodium: 135 mmol/L (ref 135–145)
Total Bilirubin: 0.4 mg/dL (ref 0.3–1.2)
Total Protein: 6.1 g/dL — ABNORMAL LOW (ref 6.5–8.1)

## 2023-07-12 LAB — TSH: TSH: 2.909 u[IU]/mL (ref 0.350–4.500)

## 2023-07-12 LAB — MAGNESIUM: Magnesium: 2 mg/dL (ref 1.7–2.4)

## 2023-07-12 MED ORDER — SODIUM CHLORIDE 0.9 % IV SOLN
1500.0000 mg | Freq: Once | INTRAVENOUS | Status: AC
  Administered 2023-07-12: 1500 mg via INTRAVENOUS
  Filled 2023-07-12: qty 30

## 2023-07-12 MED ORDER — SODIUM CHLORIDE 0.9 % IV SOLN: Freq: Once | INTRAVENOUS | Status: AC

## 2023-07-12 MED ORDER — DOXYCYCLINE HYCLATE 100 MG PO CAPS: 100.0000 mg | ORAL_CAPSULE | Freq: Two times a day (BID) | ORAL | 0 refills | Status: DC

## 2023-07-12 MED ORDER — SODIUM CHLORIDE 0.9% FLUSH
10.0000 mL | INTRAVENOUS | Status: DC | PRN
  Administered 2023-07-12: 10 mL via INTRAVENOUS

## 2023-07-12 MED ORDER — POTASSIUM CHLORIDE CRYS ER 20 MEQ PO TBCR
40.0000 meq | EXTENDED_RELEASE_TABLET | Freq: Once | ORAL | Status: AC
  Administered 2023-07-12: 40 meq via ORAL
  Filled 2023-07-12: qty 2

## 2023-07-12 MED ORDER — HEPARIN SOD (PORK) LOCK FLUSH 100 UNIT/ML IV SOLN
500.0000 [IU] | Freq: Once | INTRAVENOUS | Status: AC | PRN
  Administered 2023-07-12: 500 [IU]

## 2023-07-12 MED ORDER — ACETAMINOPHEN 325 MG PO TABS
650.0000 mg | ORAL_TABLET | Freq: Once | ORAL | Status: AC
  Administered 2023-07-12: 650 mg via ORAL
  Filled 2023-07-12: qty 2

## 2023-07-12 NOTE — Patient Instructions (Signed)
[facility]  Discharge Instructions: Thank you for choosing [facility] to provide your oncology and hematology care.  If you have a lab appointment with the Cancer Center - please note that after April 8th, 2024, all labs will be drawn in the cancer center.  You do not have to check in or register with the main entrance as you have in the past but will complete your check-in in the cancer center.  Wear comfortable clothing and clothing appropriate for easy access to any Portacath or PICC line.   We strive to give you quality time with your provider. You may need to reschedule your appointment if you arrive late (15 or more minutes).  Arriving late affects you and other patients whose appointments are after yours.  Also, if you miss three or more appointments without notifying the office, you may be dismissed from the clinic at the provider's discretion.      For prescription refill requests, have your pharmacy contact our office and allow 72 hours for refills to be completed.    Today you received the following chemotherapy and/or immunotherapy agents Imfiniz   To help prevent nausea and vomiting after your treatment, we encourage you to take your nausea medication as directed.  BELOW ARE SYMPTOMS THAT SHOULD BE REPORTED IMMEDIATELY: *FEVER GREATER THAN 100.4 F (38 C) OR HIGHER *CHILLS OR SWEATING *NAUSEA AND VOMITING THAT IS NOT CONTROLLED WITH YOUR NAUSEA MEDICATION *UNUSUAL SHORTNESS OF BREATH *UNUSUAL BRUISING OR BLEEDING *URINARY PROBLEMS (pain or burning when urinating, or frequent urination) *BOWEL PROBLEMS (unusual diarrhea, constipation, pain near the anus) TENDERNESS IN MOUTH AND THROAT WITH OR WITHOUT PRESENCE OF ULCERS (sore throat, sores in mouth, or a toothache) UNUSUAL RASH, SWELLING OR PAIN  UNUSUAL VAGINAL DISCHARGE OR ITCHING   Items with * indicate a potential emergency and should be followed up as soon as possible or go to the  Emergency Department if any problems should occur.  Please show the CHEMOTHERAPY ALERT CARD or IMMUNOTHERAPY ALERT CARD at check-in to the Emergency Department and triage nurse.  Should you have questions after your visit or need to cancel or reschedule your appointment, please contact [facility] [phone]  and follow the prompts.  Office hours are 8:00 a.m. to 4:30 p.m. Monday - Friday. Please note that voicemails left after 4:00 p.m. may not be returned until the following business day.  We are closed weekends and major holidays. You have access to a nurse at all times for urgent questions. Please call the main number to the clinic [phone] and follow the prompts.  For any non-urgent questions, you may also contact your provider using MyChart. We now offer e-Visits for anyone 81 and older to request care online for non-urgent symptoms. For details visit [email or web address].   Also download the MyChart app! Go to the app store, search "MyChart", open the app, select [place], and log in with your MyChart username and password.

## 2023-07-12 NOTE — Progress Notes (Signed)
Patients port flushed without difficulty.  Good blood return noted with no bruising or swelling noted at site.  Patient remains accessed for treatment.  

## 2023-07-12 NOTE — Progress Notes (Signed)
Patient given potassium 40 mEq per standing order for a potassium of 3.3. Pt verbalized that he has been having open blisters on his legs randomly after treatment. MD and treatment team made aware. Antibiotics sent to patients pharmacy per MD. Pt updated and all questions answered at this time.  Patient tolerated chemotherapy with no complaints voiced.  Side effects with management reviewed with understanding verbalized.  Port site clean and dry with no bruising or swelling noted at site.  Good blood return noted before and after administration of chemotherapy.  Band aid applied.  Patient left in satisfactory condition with VSS and no s/s of distress noted. All follow ups as scheduled.   Marvin Carr [employer]

## 2023-07-13 LAB — T4: T4, Total: 6.6 ug/dL (ref 4.5–12.0)

## 2023-07-13 NOTE — Progress Notes (Signed)
   07/13/2023  Patient ID: Marvin Carr, male   DOB: May 12, 1956, 67 y.o.   MRN: [id number]   2025 Medication Assistance Renewal Application Summary:  Patient was outreached regarding medication assistance renewal for 2025. Verified address, anticipated insurance for 2025, and income has not changed. Patient remains interested in PAP for 2025 for Rybelsus, no other new medications were identified for medication assistance.    Medication Review Findings:  Rybelsus - no changes  Medication Assistance Findings:  Medication assistance needs identified: Rybelsus    Plan: I will route patient assistance letter to pharmacy technician who will coordinate patient assistance program application process for medications listed above.  Pharmacy technician will assist with obtaining all required documents from both patient and provider(s) and submit application(s) once completed.    Thank you for allowing pharmacy to be a part of this patient's care.   Cephus Shelling, PharmD Clinical Pharmacist [facility] Cell: [phone]

## 2023-07-16 NOTE — Progress Notes (Signed)
Cardiology Office Note:  .   Date:  07/30/2023  ID:  Marvin Carr, DOB May 06, 1956, MRN [id number] PCP: Benita Stabile, MD  [facility] Providers Cardiologist:  Nona Dell, MD    History of Present Illness: .   Marvin Carr is a 67 y.o. male with History of nonobstructive CAD by cardiac catheterization in 2014. 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   Patient saw Dr. Diona Browner 03/2023  Has had significant trouble with fluid retention including abdominal ascites requiring several paracenteses last year and also more recently leg edema which has largely improved with treatment in the wound center. He remains on Bumex. Echo updated 04/2023 normal LVEF.   Patient in ED 05/2023 with worsening SOB. Bedside US didn't reveal profound ascites or pericardial effusion. CT unrevealing. Felt to be anxiety at night trying to get used to bipap machine and had run out of xanax. Bumex doubled for 3 days.  Patient says he was breathing normal when he left the ED and he's not sure he doubled up his bumex. He complains of being sleepy, nods off quickly. He turned his webcam on and one episode lasted 10 min.now it's happening when he's standing and he's dropped a dinner plate. He doesn't know if he's passing out. He denies any dizziness, palpitations. He doesn't think he loses consciousness and isn't disoriented when he awakens. Lives alone and no one has witnessed this. BP very low.labs 10/31 Hbg 11.5, K 3.3 Crt 1.33.  ROS:    Studies Reviewed: Marland Kitchen         Prior CV Studies:   Echo 04/2023  IMPRESSIONS     1. Left ventricular ejection fraction, by estimation, is 60 to 65%. The  left ventricle has normal function. The left ventricle has no regional  wall motion abnormalities. There is mild asymmetric left ventricular  hypertrophy of the  inferior segment. Left   ventricular diastolic parameters were normal.   2. Right ventricular systolic function is mildly reduced. The right  ventricular size is normal. Tricuspid regurgitation signal is inadequate  for assessing PA pressure.   3. The mitral valve is normal in structure. No evidence of mitral valve  regurgitation. No evidence of mitral stenosis.   4. The aortic valve is normal in structure. Aortic valve regurgitation is  not visualized. No aortic stenosis is present.   5. The inferior vena cava is normal in size with greater than 50%  respiratory variability, suggesting right atrial pressure of 3 mmHg.   FINDINGS   Left Ventricle: Left ventricular ejection fraction, by estimation, is 60  to 65%. The left ventricle has normal function. The left ventricle has no  regional wall motion abnormalities. The left ventricular internal cavity  size was normal in size. There is   mild asymmetric left ventricular hypertrophy of the inferior segment.  Left ventricular diastolic parameters were normal.   Right Ventricle: The right ventricular size is normal. No increase in  right ventricular wall thickness. Right ventricular systolic function is  mildly reduced. Tricuspid regurgitation signal is inadequate for assessing  PA pressure.   Left Atrium: Left atrial size was normal in size.   Right Atrium: Right atrial size was normal in size.   Pericardium: There is no evidence of pericardial effusion. Presence of  epicardial fat layer.   Mitral Valve: The mitral valve is normal in structure. No evidence of  mitral  valve regurgitation. No evidence of mitral valve stenosis.   Tricuspid Valve: The tricuspid valve is normal in structure. Tricuspid  valve regurgitation is not demonstrated. No evidence of tricuspid  stenosis.   Aortic Valve: The aortic valve is normal in structure. Aortic valve  regurgitation is not visualized. No aortic stenosis is present.   Pulmonic Valve: The  pulmonic valve was normal in structure. Pulmonic valve  regurgitation is trivial. No evidence of pulmonic stenosis.   Aorta: The aortic root is normal in size and structure.   Venous: The inferior vena cava is normal in size with greater than 50%  respiratory variability, suggesting right atrial pressure of 3 mmHg.    Echocardiogram 06/05/2022:  1. Left ventricular ejection fraction, by estimation, is 60 to 65%. The  left ventricle has normal function. The left ventricle has no regional  wall motion abnormalities. There is mild asymmetric left ventricular  hypertrophy of the septal segment. Left  ventricular diastolic parameters are indeterminate. Elevated left  ventricular end-diastolic pressure.   2. Right ventricular systolic function is normal. The right ventricular  size is normal. Tricuspid regurgitation signal is inadequate for assessing  PA pressure.   3. Left atrial size was mildly dilated.   4. The mitral valve is grossly normal. Trivial mitral valve  regurgitation.   5. The aortic valve is tricuspid. Aortic valve regurgitation is trivial.  No aortic stenosis is present. Aortic valve mean gradient measures 4.0  mmHg.   6. The inferior vena cava is normal in size with greater than 50%  respiratory variability, suggesting right atrial pressure of 3 mmHg.     Risk Assessment/Calculations:             Physical Exam:   VS:  BP (!) 80/60 (BP Location: Left Arm, Cuff Size: Large)   Pulse 78   Ht 6' (1.829 m)   Wt 288 lb 3.2 oz (130.7 kg)   SpO2 93%   BMI 39.09 kg/m    Wt Readings from Last 3 Encounters:  07/30/23 288 lb 3.2 oz (130.7 kg)  07/12/23 289 lb 14.4 oz (131.5 kg)  07/03/23 294 lb (133.4 kg)    GEN: Obese, in no acute distress NECK: No JVD; No carotid bruits CARDIAC:  RRR, no murmurs, rubs, gallops RESPIRATORY:  Clear to auscultation without rales, wheezing or rhonchi  ABDOMEN: Soft, non-tender, non-distended EXTREMITIES:  Plus 1 edema; No deformity    ASSESSMENT AND PLAN: .    Periods of falling asleep vs presyncope? No dizziness but he describes as sudden falling asleep- one episode he caught on webcam has lasted 10 min. It also occurs while standing-has dropped a dinner plate. BP running low and he is orthostatic in the office today. Will check 2 week zio. Stop amlodipine and Zaroxolyn(fluid issues not a problem lately). Return in 1 week for a nurse visit for orthostatic BP's. Check bmet and cbc today.   History of nonobstructive CAD by cardiac catheterization in 2014. Echocardiogram 04/2023 LVEF 60 to 65%.  Fluid retention while on chemo recent echo normal LVEF remains on bumex 2mg  daily and Zaroxolyn 5 mg 3 times a week-will try to hold off on Zaroxolyn as fluid retention hasn't been an issue lately. Can resume if needed.  OSA on CPAP-getting used to it.  Cholangiocarcinoma with peritoneal carcinomatosis -chemo monthly and tolerates well.         Dispo: f/u in 4 weeks with me  Signed, Jacolyn Reedy, PA-C

## 2023-07-19 ENCOUNTER — Telehealth: Payer: Self-pay | Admitting: Pharmacy Technician

## 2023-07-19 DIAGNOSIS — Z5986 Financial insecurity: Secondary | ICD-10-CM

## 2023-07-19 NOTE — Progress Notes (Signed)
[facility])                                            [facility] Team    07/19/2023  Marvin Carr 07/10/1956 [id number]                                      Medication Assistance Referral  Referral From:  [facility]  Asajah Para March  Medication/Company: [employer] Patient application portion:  Mining engineer portion: Faxed  to Dr. Benita Stabile Provider address/fax verified via: Office website  Pattricia Boss, CPhT [place]  Office: [phone] Fax: [phone] Email: [email or web address]

## 2023-07-23 ENCOUNTER — Encounter: Payer: Self-pay | Admitting: Hematology

## 2023-07-30 ENCOUNTER — Other Ambulatory Visit: Payer: Self-pay | Admitting: Physician Assistant

## 2023-07-30 ENCOUNTER — Encounter: Payer: Self-pay | Admitting: Physician Assistant

## 2023-07-30 ENCOUNTER — Encounter: Payer: Self-pay | Admitting: Hematology

## 2023-07-30 ENCOUNTER — Ambulatory Visit: Payer: Medicare Other | Admitting: Physician Assistant

## 2023-07-30 ENCOUNTER — Other Ambulatory Visit: Payer: Medicare Other

## 2023-07-30 ENCOUNTER — Other Ambulatory Visit (HOSPITAL_COMMUNITY)
Admission: RE | Admit: 2023-07-30 | Discharge: 2023-07-30 | Disposition: A | Discharge status: Home / Self Care | Payer: Medicare Other | Source: Ambulatory Visit | Attending: Physician Assistant | Admitting: Physician Assistant

## 2023-07-30 VITALS — BP 80/60 | HR 78 | Ht 72.0 in | Wt 288.2 lb

## 2023-07-30 DIAGNOSIS — C786 Secondary malignant neoplasm of retroperitoneum and peritoneum: Secondary | ICD-10-CM

## 2023-07-30 DIAGNOSIS — Z79899 Other long term (current) drug therapy: Secondary | ICD-10-CM

## 2023-07-30 DIAGNOSIS — R609 Edema, unspecified: Secondary | ICD-10-CM | POA: Insufficient documentation

## 2023-07-30 DIAGNOSIS — C221 Intrahepatic bile duct carcinoma: Secondary | ICD-10-CM | POA: Insufficient documentation

## 2023-07-30 DIAGNOSIS — R55 Syncope and collapse: Secondary | ICD-10-CM | POA: Insufficient documentation

## 2023-07-30 DIAGNOSIS — C787 Secondary malignant neoplasm of liver and intrahepatic bile duct: Secondary | ICD-10-CM | POA: Insufficient documentation

## 2023-07-30 DIAGNOSIS — I2510 Atherosclerotic heart disease of native coronary artery without angina pectoris: Secondary | ICD-10-CM | POA: Insufficient documentation

## 2023-07-30 DIAGNOSIS — G4733 Obstructive sleep apnea (adult) (pediatric): Secondary | ICD-10-CM

## 2023-07-30 LAB — BASIC METABOLIC PANEL
Anion gap: 9 (ref 5–15)
BUN: 46 mg/dL — ABNORMAL HIGH (ref 8–23)
CO2: 26 mmol/L (ref 22–32)
Calcium: 9.3 mg/dL (ref 8.9–10.3)
Chloride: 101 mmol/L (ref 98–111)
Creatinine, Ser: 1.2 mg/dL (ref 0.61–1.24)
GFR, Estimated: >60 mL/min (ref >60)
Glucose, Bld: 113 mg/dL — ABNORMAL HIGH (ref 70–99)
Potassium: 3.5 mmol/L (ref 3.5–5.1)
Sodium: 136 mmol/L (ref 135–145)

## 2023-07-30 LAB — CBC
HCT: 36.8 % — ABNORMAL LOW (ref 39.0–52.0)
Hemoglobin: 12.3 g/dL — ABNORMAL LOW (ref 13.0–17.0)
MCH: 29.7 pg (ref 26.0–34.0)
MCHC: 33.4 g/dL (ref 30.0–36.0)
MCV: 88.9 fL (ref 80.0–100.0)
Platelets: 266 10*3/uL (ref 150–400)
RBC: 4.14 MIL/uL — ABNORMAL LOW (ref 4.22–5.81)
RDW: 14 % (ref 11.5–15.5)
WBC: 6.6 10*3/uL (ref 4.0–10.5)
nRBC: 0 % (ref 0.0–0.2)

## 2023-07-30 MED ORDER — METOLAZONE 5 MG PO TABS: 5.0000 mg | ORAL_TABLET | ORAL | Status: DC

## 2023-07-30 NOTE — Patient Instructions (Addendum)
Medication Instructions:  Your physician has recommended you make the following change in your medication:  Stop amlodipine Hold metolazone. If you start having swelling, go ahead and take it but notify our office Continue all other medications as prescribed  Labwork: BMET & CBC today at [facility]  Testing/Procedures: 14 ZIO AT  Follow-Up: Your physician recommends that you schedule a follow-up appointment in: 3-4 weeks Your physician recommends that you schedule a follow-up appointment in: 1 week for a nurse visit for orthostatic blood pressures  Any Other Special Instructions Will Be Listed Below (If Applicable). Change positions slowly Your physician has requested that you regularly monitor and record your blood pressure readings at home. Please use the same machine at the same time of day to check your readings and record them to bring to your nurse visit and follow-up visit.  If you need a refill on your cardiac medications before your next appointment, please call your pharmacy.

## 2023-07-31 ENCOUNTER — Telehealth: Payer: Self-pay | Admitting: Cardiology

## 2023-07-31 NOTE — Telephone Encounter (Signed)
Pt reports that his monitor fell off after about 3 hours. Monitor rep Murriel Hopper notified and shipped another monitor to pt.

## 2023-07-31 NOTE — Telephone Encounter (Signed)
Patient stated his heart monitor probes are not adhering to his chest.  Patient wants advice on next steps.

## 2023-08-06 ENCOUNTER — Ambulatory Visit: Payer: Medicare Other

## 2023-08-06 ENCOUNTER — Telehealth: Payer: Self-pay

## 2023-08-06 ENCOUNTER — Ambulatory Visit: Payer: Medicare Other | Admitting: Family Medicine

## 2023-08-06 NOTE — Telephone Encounter (Signed)
Pt is in [place], will return to [facility] tomorrow and place second Zio back on then.

## 2023-08-07 ENCOUNTER — Ambulatory Visit: Payer: Medicare Other

## 2023-08-13 ENCOUNTER — Inpatient Hospital Stay (HOSPITAL_BASED_OUTPATIENT_CLINIC_OR_DEPARTMENT_OTHER): Payer: Medicare Other | Admitting: Hematology

## 2023-08-13 ENCOUNTER — Inpatient Hospital Stay: Payer: Medicare Other | Attending: Hematology

## 2023-08-13 ENCOUNTER — Inpatient Hospital Stay: Payer: Medicare Other

## 2023-08-13 VITALS — BP 143/85 | HR 86 | Temp 99.4°F | Resp 18 | Wt 289.2 lb

## 2023-08-13 VITALS — BP 141/76 | HR 93 | Temp 98.5°F | Resp 18

## 2023-08-13 VITALS — BP 132/71 | HR 88 | Temp 98.7°F | Resp 18

## 2023-08-13 DIAGNOSIS — Z79620 Long term (current) use of immunosuppressive biologic: Secondary | ICD-10-CM | POA: Diagnosis not present

## 2023-08-13 DIAGNOSIS — G629 Polyneuropathy, unspecified: Secondary | ICD-10-CM | POA: Insufficient documentation

## 2023-08-13 DIAGNOSIS — G479 Sleep disorder, unspecified: Secondary | ICD-10-CM | POA: Insufficient documentation

## 2023-08-13 DIAGNOSIS — C221 Intrahepatic bile duct carcinoma: Secondary | ICD-10-CM | POA: Diagnosis present

## 2023-08-13 DIAGNOSIS — Z5112 Encounter for antineoplastic immunotherapy: Secondary | ICD-10-CM | POA: Insufficient documentation

## 2023-08-13 DIAGNOSIS — E876 Hypokalemia: Secondary | ICD-10-CM | POA: Insufficient documentation

## 2023-08-13 DIAGNOSIS — Z79899 Other long term (current) drug therapy: Secondary | ICD-10-CM | POA: Diagnosis not present

## 2023-08-13 DIAGNOSIS — R0602 Shortness of breath: Secondary | ICD-10-CM | POA: Diagnosis not present

## 2023-08-13 DIAGNOSIS — Z87891 Personal history of nicotine dependence: Secondary | ICD-10-CM | POA: Insufficient documentation

## 2023-08-13 DIAGNOSIS — Z8551 Personal history of malignant neoplasm of bladder: Secondary | ICD-10-CM | POA: Insufficient documentation

## 2023-08-13 DIAGNOSIS — Z95828 Presence of other vascular implants and grafts: Secondary | ICD-10-CM

## 2023-08-13 DIAGNOSIS — M25473 Effusion, unspecified ankle: Secondary | ICD-10-CM | POA: Diagnosis not present

## 2023-08-13 DIAGNOSIS — M545 Low back pain, unspecified: Secondary | ICD-10-CM | POA: Insufficient documentation

## 2023-08-13 LAB — COMPREHENSIVE METABOLIC PANEL
ALT: 26 U/L (ref 0–44)
AST: 21 U/L (ref 15–41)
Albumin: 3.5 g/dL (ref 3.5–5.0)
Alkaline Phosphatase: 66 U/L (ref 38–126)
Anion gap: 10 (ref 5–15)
BUN: 53 mg/dL — ABNORMAL HIGH (ref 8–23)
CO2: 26 mmol/L (ref 22–32)
Calcium: 9.7 mg/dL (ref 8.9–10.3)
Chloride: 100 mmol/L (ref 98–111)
Creatinine, Ser: 1.29 mg/dL — ABNORMAL HIGH (ref 0.61–1.24)
GFR, Estimated: >60 mL/min (ref >60)
Glucose, Bld: 105 mg/dL — ABNORMAL HIGH (ref 70–99)
Potassium: 3.7 mmol/L (ref 3.5–5.1)
Sodium: 136 mmol/L (ref 135–145)
Total Bilirubin: 0.4 mg/dL (ref <1.2)
Total Protein: 6.8 g/dL (ref 6.5–8.1)

## 2023-08-13 LAB — CBC WITH DIFFERENTIAL/PLATELET
Abs Immature Granulocytes: 0.03 10*3/uL (ref 0.00–0.07)
Basophils Absolute: 0 10*3/uL (ref 0.0–0.1)
Basophils Relative: 0 %
Eosinophils Absolute: 0.2 10*3/uL (ref 0.0–0.5)
Eosinophils Relative: 3 %
HCT: 38.2 % — ABNORMAL LOW (ref 39.0–52.0)
Hemoglobin: 12.2 g/dL — ABNORMAL LOW (ref 13.0–17.0)
Immature Granulocytes: 0 %
Lymphocytes Relative: 13 %
Lymphs Abs: 1.1 10*3/uL (ref 0.7–4.0)
MCH: 28.5 pg (ref 26.0–34.0)
MCHC: 31.9 g/dL (ref 30.0–36.0)
MCV: 89.3 fL (ref 80.0–100.0)
Monocytes Absolute: 0.9 10*3/uL (ref 0.1–1.0)
Monocytes Relative: 10 %
Neutro Abs: 6.5 10*3/uL (ref 1.7–7.7)
Neutrophils Relative %: 74 %
Platelets: 302 10*3/uL (ref 150–400)
RBC: 4.28 MIL/uL (ref 4.22–5.81)
RDW: 14.2 % (ref 11.5–15.5)
WBC: 8.8 10*3/uL (ref 4.0–10.5)
nRBC: 0 % (ref 0.0–0.2)

## 2023-08-13 LAB — MAGNESIUM: Magnesium: 1.9 mg/dL (ref 1.7–2.4)

## 2023-08-13 LAB — TSH: TSH: 2.264 u[IU]/mL (ref 0.350–4.500)

## 2023-08-13 MED ORDER — HEPARIN SOD (PORK) LOCK FLUSH 100 UNIT/ML IV SOLN
500.0000 [IU] | Freq: Once | INTRAVENOUS | Status: AC | PRN
  Administered 2023-08-13: 500 [IU]

## 2023-08-13 MED ORDER — SODIUM CHLORIDE 0.9 % IV SOLN: Freq: Once | INTRAVENOUS | Status: AC

## 2023-08-13 MED ORDER — SODIUM CHLORIDE 0.9% FLUSH
10.0000 mL | Freq: Once | INTRAVENOUS | Status: AC
  Administered 2023-08-13: 10 mL via INTRAVENOUS

## 2023-08-13 MED ORDER — SODIUM CHLORIDE 0.9 % IV SOLN
1500.0000 mg | Freq: Once | INTRAVENOUS | Status: AC
  Administered 2023-08-13: 1500 mg via INTRAVENOUS
  Filled 2023-08-13: qty 30

## 2023-08-13 NOTE — Progress Notes (Signed)
[facility] [address], [place] [address]    Clinic Day:  08/13/2023  Referring physician: Benita Stabile, MD  Patient Care Team: Benita Stabile, MD as PCP - General (Internal Medicine) Jonelle Sidle, MD as PCP - Cardiology (Cardiology) Jonelle Sidle, MD as Consulting Physician (Cardiology) Doreatha Massed, MD as Medical Oncologist (Medical Oncology) [name], MD as Consulting Physician (Neurology)   ASSESSMENT & PLAN:   Assessment: 1. Liver lesion/peritoneal carcinomatosis: - Patient seen at the request of Dr. [name] - Reported pressure on the sides of the abdomen with slight pain for the last 1 month.  He also reported pain in the epigastric region. - He had lost 20 pounds in the last 3 to 4 months intentionally, cutting back on sugars.  He was also started on semaglutide. - Colonoscopy on 06/28/2011 with a benign polypoid colonic mucosa in the descending colon.  No evidence of malignancy. - MRI of the brain was negative. - EGD and colonoscopy on 09/23/2018 did not reveal any malignancies. 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  IHC positive for CK7, CDX2 and negative for GATA3.  Findings suggestive of upper GI or pancreaticobiliary primary. - Cycle 1 of gemcitabine, cisplatin and durvalumab started on 10/05/2021. - NGS: No targetable mutations.  PD-L1 (HY865) negative.  MSI-stable.  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   2. Social/family history: - He lives at home by himself.  He records voiceover/narrations. - Non-smoker. - Father died of MDS.  Paternal grandfather had prostate cancer.  Maternal grandfather had cancer.  3.  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 Laverle Patter.  Reportedly received 1 treatment of intravesical chemo and has been on surveillance since then.  Last surveillance visit was in 2020.    Plan: 1. Cholangiocarcinoma with peritoneal  carcinomatosis: - CT CAP (06/01/2023): No evidence of malignancy. - He is tolerating durvalumab reasonably well.  He reports decrease in energy levels.  He is sleeping well and has gotten used to CPAP.  He is wearing a cardiac monitor for blackout spells. - Reviewed labs today: Normal CBC.  TSH is 2.2.  CMP is pending. - Recommend proceeding with durvalumab every 4 weeks.  RTC 8 weeks with repeat CT CAP and tumor marker.   2. Fluid retention: - He was evaluated by cardiology for "blackout episodes".  Diuretics were discontinued on 07/30/2023.  He reported ankle swellings getting worse and has started back on Bumex on 08/07/2023.  He is off of metolazone.  Swelling has slightly improved.  Continue Bumex daily.  3.  Neuropathy/"drawing" of legs: - Continue Mirapex 0.75 mg twice daily and gabapentin 300 mg 4 times daily.  4.  Sleeping difficulty: - Continue Sonata daily.  5.  Hypomagnesemia/hypokalemia: - Continue potassium 20 mill equivalents daily and magnesium 3 times daily.   6.  Right-sided lower back pain: - MRI lumbar spine and pelvis on 05/30/2023 with degenerative disc disease and no evidence of malignancy.    Orders Placed This Encounter  Procedures   CT CHEST ABDOMEN PELVIS W CONTRAST    Standing Status:   Future    Standing Expiration Date:   08/12/2024    Order Specific Question:   If indicated for the ordered procedure, I authorize the administration of contrast media per Radiology protocol    Answer:   Yes    Order Specific Question:   Does the patient have a contrast media/X-ray dye allergy?  Answer:   No    Order Specific Question:   Preferred imaging location?    Answer:   [facility]    Order Specific Question:   If indicated for the ordered procedure, I authorize the administration of oral contrast media per Radiology protocol    Answer:   Yes   Cancer antigen 19-9    Standing Status:   Future    Standing Expiration Date:   08/12/2024      I,Katie  Daubenspeck,acting as a scribe for Doreatha Massed, MD.,have documented all relevant documentation on the behalf of Doreatha Massed, MD,as directed by  Doreatha Massed, MD while in the presence of Doreatha Massed, MD.   I, Doreatha Massed MD, have reviewed the above documentation for accuracy and completeness, and I agree with the above.   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 male presenting to clinic today for follow up of cholangiocarcinoma and peritoneal carcinomatosis. He was last seen by me on 06/14/23.  Today, he states that he is doing well overall. His appetite level is at 100%. His energy level is at 40%.  PAST MEDICAL HISTORY:   Past Medical History: Past Medical History:  Diagnosis Date   Anxiety    Bladder cancer (HCC) 09/11/2009   CAD (coronary artery disease), native coronary artery    a. Mildly elevated troponin 03/2013, cath with nonobstructive disease including 50% AV groove distal stenosis before large OM   Essential hypertension    Headache(784.0)    History of migraines    Hyperglycemia    Mixed hyperlipidemia    Neuropathy    Obesity    Port-A-Cath in place 09/30/2021   Pre-diabetes    Seasonal allergies    Sleep apnea    On CPAP    Surgical History: Past Surgical History:  Procedure Laterality Date   BIOPSY  09/23/2021   Procedure: BIOPSY;  Surgeon: Dolores Frame, MD;  [facility];  Service: Gastroenterology;;   COLONOSCOPY  06/28/2011   Procedure: COLONOSCOPY;  Surgeon: Malissa Hippo, MD;  [facility];  Service: Endoscopy;  Laterality: N/A;   COLONOSCOPY WITH PROPOFOL N/A 09/23/2021   Procedure: COLONOSCOPY WITH PROPOFOL;  Surgeon: Dolores Frame, MD;  [facility];  Service: Gastroenterology;  Laterality: N/A;  940   ESOPHAGOGASTRODUODENOSCOPY (EGD) WITH PROPOFOL N/A 09/23/2021   Procedure: ESOPHAGOGASTRODUODENOSCOPY (EGD) WITH PROPOFOL;  Surgeon: Dolores Frame, MD;  [facility];  Service: Gastroenterology;  Laterality: N/A;   IR IMAGING GUIDED PORT INSERTION  09/28/2021   IR PARACENTESIS  09/28/2021   JOINT REPLACEMENT Right  hip   LEFT HEART CATHETERIZATION WITH CORONARY ANGIOGRAM N/A 03/31/2013   Procedure: LEFT HEART CATHETERIZATION WITH CORONARY ANGIOGRAM;  Surgeon: [name], MD;  Location: [facility] CATH LAB;  Service: Cardiovascular;  Laterality: N/A;   POLYPECTOMY  09/23/2021   Procedure: POLYPECTOMY;  Surgeon: Dolores Frame, MD;  [facility]: [facility];  Service: Gastroenterology;;   TURBT  09/11/2009    Social History: Social History   Socioeconomic History   Marital status: Divorced    Spouse name: Not on file   Number of children: 0   Years of education: college   Highest education level: Not on file  Occupational History    Employer: SELF-EMPLOYED  Tobacco Use   Smoking status: Former    Current packs/day: 0.00    Average packs/day: 0.5 packs/day for 10.0 years (5.0 ttl pk-yrs)    Types: Cigarettes    Start date: 07/09/2001     Quit date: 07/10/2011    Years since quitting: 12.1   Smokeless tobacco: Never   Tobacco comments:    Quit several yrs prior to 03/2013.  Vaping Use   Vaping status: Never Used  Substance and Sexual Activity   Alcohol use: Yes    Alcohol/week: 0.0 standard drinks of alcohol    Comment: Occasional   Drug use: No   Sexual activity: Not on file  Other Topics Concern   Not on file  Social History Narrative   Not on file   Social Determinants of Health   Financial Resource Strain: Not on file  Food Insecurity: No Food Insecurity (06/05/2022)   Hunger Vital Sign    Worried About Running Out of Food in the Last Year: Never true    Ran Out of Food in the Last Year: Never true  Transportation Needs: No Transportation Needs (06/05/2022)   PRAPARE - Administrator, Civil Service (Medical): No    Lack of Transportation (Non-Medical): No  Physical Activity: Not on file  Stress: Not on file  Social Connections: Not on file  Intimate Partner Violence: Not At Risk (06/05/2022)   Humiliation, Afraid, Rape, and Kick questionnaire    Fear of Current or Ex-Partner: No    Emotionally Abused: No    Physically Abused: No    Sexually Abused: No    Family History: Family History  Problem Relation Age of Onset   COPD Father     Current Medications:  Current Outpatient Medications:    ALPRAZolam (XANAX) 0.5 MG tablet, Take 1 tablet (0.5 mg total) by mouth at bedtime as needed for anxiety., Disp: 3 tablet, Rfl: 0   amLODipine (NORVASC) 5 MG tablet, Take 5 mg by mouth daily., Disp: , Rfl:    aspirin EC 81 MG tablet, Take 81 mg by mouth daily., Disp: , Rfl:    bumetanide (BUMEX) 2 MG tablet, TAKE (1) TABLET BY MOUTH EACH MORNING., Disp: 30 tablet, Rfl: 2   cyclobenzaprine (FLEXERIL) 10 MG tablet, Take 10 mg by mouth at bedtime as needed for muscle spasms., Disp: , Rfl:    Durvalumab (IMFINZI IV), Inject into the vein every 21 ( twenty-one) days., Disp: , Rfl:    Eszopiclone 3 MG TABS,  Take 3 mg by mouth at bedtime. Take immediately before bedtime, Disp: , Rfl:    fenofibrate 160 MG tablet, Take 160 mg by mouth daily., Disp: , Rfl:    magnesium oxide (MAG-OX) 400 (240 Mg) MG tablet, Take 1 tablet (400 mg total) by mouth 2 (two) times daily., Disp:  90 tablet, Rfl: 6   metFORMIN (GLUCOPHAGE-XR) 500 MG 24 hr tablet, Take 500 mg by mouth 2 (two) times daily., Disp: , Rfl:    metolazone (ZAROXOLYN) 5 MG tablet, Take 1 tablet (5 mg total) by mouth 3 (three) times a week. 08/02/2023 HOLD-if swelling re-occurs, restart and contact office, Disp: , Rfl:    nitroGLYCERIN (NITROSTAT) 0.4 MG SL tablet, Place 0.4 mg under the tongue every 5 (five) minutes x 3 doses as needed for chest pain (if no relief after 2nd dose, proceed to ED or call 911)., Disp: , Rfl:    olmesartan (BENICAR) 40 MG tablet, TAKE (1) TABLET BY MOUTH ONCE DAILY., Disp: 30 tablet, Rfl: 5   pantoprazole (PROTONIX) 40 MG tablet, Take 1 tablet (40 mg total) by mouth daily., Disp: 90 tablet, Rfl: 0   potassium chloride SA (KLOR-CON M) 20 MEQ tablet, Take 40 mEq by mouth daily., Disp: , Rfl:    pramipexole (MIRAPEX) 0.75 MG tablet, Take 1 tablet (0.75 mg total) by mouth 3 (three) times daily., Disp: 90 tablet, Rfl: 0   RYBELSUS 7 MG TABS, Take 14 mg by mouth daily., Disp: , Rfl:    tamsulosin (FLOMAX) 0.4 MG CAPS capsule, Take 0.4 mg by mouth daily., Disp: , Rfl:    traMADol (ULTRAM) 50 MG tablet, Take 50 mg by mouth 4 (four) times daily as needed for moderate pain., Disp: , Rfl:    zaleplon (SONATA) 10 MG capsule, Take 1 capsule (10 mg total) by mouth at bedtime as needed for sleep., Disp: 30 capsule, Rfl: 5 No current facility-administered medications for this visit.  Facility-Administered Medications Ordered in Other Visits:    magnesium sulfate 2 GM/50ML IVPB, , , ,    Allergies: No Known Allergies  REVIEW OF SYSTEMS:   Review of Systems  Constitutional:  Negative for chills, fatigue and fever.  HENT:   Negative for  lump/mass, mouth sores, nosebleeds, sore throat and trouble swallowing.   Eyes:  Negative for eye problems.  Respiratory:  Positive for shortness of breath. Negative for cough.   Cardiovascular:  Positive for leg swelling. Negative for chest pain and palpitations.  Gastrointestinal:  Negative for abdominal pain, constipation, diarrhea, nausea and vomiting.  Genitourinary:  Negative for bladder incontinence, difficulty urinating, dysuria, frequency, hematuria and nocturia.   Musculoskeletal:  Negative for arthralgias, back pain, flank pain, myalgias and neck pain.  Skin:  Negative for itching and rash.  Neurological:  Positive for dizziness and numbness. Negative for headaches.  Hematological:  Does not bruise/bleed easily.  Psychiatric/Behavioral:  Negative for depression, sleep disturbance and suicidal ideas. The patient is not nervous/anxious.   All other systems reviewed and are negative.    VITALS:   Blood pressure (!) 143/85, pulse 86, temperature 99.4 F (37.4 C), temperature source Tympanic, resp. rate 18, weight 289 lb 3.9 oz (131.2 kg), SpO2 96%.  Wt Readings from Last 3 Encounters:  08/13/23 289 lb 3.9 oz (131.2 kg)  07/30/23 288 lb 3.2 oz (130.7 kg)  07/12/23 289 lb 14.4 oz (131.5 kg)    Body mass index is 39.23 kg/m.  Performance status (ECOG): 1 - Symptomatic but completely ambulatory  PHYSICAL EXAM:   Physical Exam Vitals and nursing note reviewed. Exam conducted with a chaperone present.  Constitutional:      Appearance: Normal appearance.  Cardiovascular:     Rate and Rhythm: Normal rate and regular rhythm.     Pulses: Normal pulses.     Heart sounds: Normal heart sounds.  Pulmonary:     Effort: Pulmonary effort is normal.     Breath sounds: Normal breath sounds.  Abdominal:     Palpations: Abdomen is soft. There is no hepatomegaly, splenomegaly or mass.     Tenderness: There is no abdominal tenderness.  Musculoskeletal:     Right lower leg: Edema present.      Left lower leg: Edema present.  Lymphadenopathy:     Cervical: No cervical adenopathy.     Right cervical: No superficial, deep or posterior cervical adenopathy.    Left cervical: No superficial, deep or posterior cervical adenopathy.     Upper Body:     Right upper body: No supraclavicular or axillary adenopathy.     Left upper body: No supraclavicular or axillary adenopathy.  Neurological:     General: No focal deficit present.     Mental Status: He is alert and oriented to person, place, and time.  Psychiatric:        Mood and Affect: Mood normal.        Behavior: Behavior normal.     LABS:      Latest Ref Rng & Units 08/13/2023   12:24 PM 07/30/2023   12:45 PM 07/12/2023   10:08 AM  CBC  WBC 4.0 - 10.5 K/uL 8.8  6.6  4.6   Hemoglobin 13.0 - 17.0 g/dL 13.0  86.5  78.4   Hematocrit 39.0 - 52.0 % 38.2  36.8  35.1   Platelets 150 - 400 K/uL 302  266  256       Latest Ref Rng & Units 07/30/2023   12:45 PM 07/12/2023   10:08 AM 06/14/2023    8:23 AM  CMP  Glucose 70 - 99 mg/dL 696  295  284   BUN 8 - 23 mg/dL 46  37  34   Creatinine 0.61 - 1.24 mg/dL 1.32  4.40  1.02   Sodium 135 - 145 mmol/L 136  135  132   Potassium 3.5 - 5.1 mmol/L 3.5  3.3  3.5   Chloride 98 - 111 mmol/L 101  100  97   CO2 22 - 32 mmol/L 26  25  27    Calcium 8.9 - 10.3 mg/dL 9.3  8.9  8.9   Total Protein 6.5 - 8.1 g/dL  6.1  6.7   Total Bilirubin 0.3 - 1.2 mg/dL  0.4  0.2   Alkaline Phos 38 - 126 U/L  64  71   AST 15 - 41 U/L  34  24   ALT 0 - 44 U/L  36  26      Lab Results  Component Value Date   CEA1 2.8 10/31/2022   /  CEA  Date Value Ref Range Status  10/31/2022 2.8 0.0 - 4.7 ng/mL Final    Comment:    (NOTE)                             Nonsmokers          <3.9                             Smokers             <5.6 Roche [employer] Electrochemiluminescence Immunoassay (ECLIA) Values obtained with different assay methods or kits cannot be used interchangeably.  Results cannot  be interpreted as absolute evidence of the presence or absence of  malignant disease. Performed At: [facility] [address] [place], [place] [id number] Jolene Schimke MD WU:[id number]    No results found for: "PSA1" Lab Results  Component Value Date   NFA213 11 10/31/2022   No results found for: "CAN125"  No results found for: "TOTALPROTELP", "ALBUMINELP", "A1GS", "A2GS", "BETS", "BETA2SER", "GAMS", "MSPIKE", "SPEI" Lab Results  Component Value Date   TIBC 428 01/29/2023   TIBC 457 (H) 01/04/2022   FERRITIN 183 01/29/2023   FERRITIN 522 (H) 01/04/2022   IRONPCTSAT 15 01/29/2023   IRONPCTSAT 16 (L) 01/04/2022   Lab Results  Component Value Date   LDH 266 (H) 10/05/2021   LDH 242 (H) 09/01/2021     STUDIES:   No results found.

## 2023-08-13 NOTE — Patient Instructions (Signed)
[facility] [place] - A DEPT OF [facility]  Discharge Instructions: Thank you for choosing [facility] to provide your oncology and hematology care.  If you have a lab appointment with the Cancer Center - please note that after April 8th, 2024, all labs will be drawn in the cancer center.  You do not have to check in or register with the main entrance as you have in the past but will complete your check-in in the cancer center.  Wear comfortable clothing and clothing appropriate for easy access to any Portacath or PICC line.   We strive to give you quality time with your provider. You may need to reschedule your appointment if you arrive late (15 or more minutes).  Arriving late affects you and other patients whose appointments are after yours.  Also, if you miss three or more appointments without notifying the office, you may be dismissed from the clinic at the provider's discretion.      For prescription refill requests, have your pharmacy contact our office and allow 72 hours for refills to be completed.    Today you received the following chemotherapy and/or immunotherapy agents imfiniz   To help prevent nausea and vomiting after your treatment, we encourage you to take your nausea medication as directed.  BELOW ARE SYMPTOMS THAT SHOULD BE REPORTED IMMEDIATELY: *FEVER GREATER THAN 100.4 F (38 C) OR HIGHER *CHILLS OR SWEATING *NAUSEA AND VOMITING THAT IS NOT CONTROLLED WITH YOUR NAUSEA MEDICATION *UNUSUAL SHORTNESS OF BREATH *UNUSUAL BRUISING OR BLEEDING *URINARY PROBLEMS (pain or burning when urinating, or frequent urination) *BOWEL PROBLEMS (unusual diarrhea, constipation, pain near the anus) TENDERNESS IN MOUTH AND THROAT WITH OR WITHOUT PRESENCE OF ULCERS (sore throat, sores in mouth, or a toothache) UNUSUAL RASH, SWELLING OR PAIN  UNUSUAL VAGINAL DISCHARGE OR ITCHING   Items with * indicate a potential emergency and should be followed up as  soon as possible or go to the Emergency Department if any problems should occur.  Please show the CHEMOTHERAPY ALERT CARD or IMMUNOTHERAPY ALERT CARD at check-in to the Emergency Department and triage nurse.  Should you have questions after your visit or need to cancel or reschedule your appointment, please contact [facility] [place] - A DEPT OF [facility] [phone]  and follow the prompts.  Office hours are 8:00 a.m. to 4:30 p.m. Monday - Friday. Please note that voicemails left after 4:00 p.m. may not be returned until the following business day.  We are closed weekends and major holidays. You have access to a nurse at all times for urgent questions. Please call the main number to the clinic [phone] and follow the prompts.  For any non-urgent questions, you may also contact your provider using MyChart. We now offer e-Visits for anyone 68 and older to request care online for non-urgent symptoms. For details visit [email or web address].   Also download the MyChart app! Go to the app store, search "MyChart", open the app, select [place], and log in with your MyChart username and password.

## 2023-08-13 NOTE — Progress Notes (Signed)
Patient tolerated chemotherapy with no complaints voiced.  Side effects with management reviewed with understanding verbalized.  Port site clean and dry with no bruising or swelling noted at site.  Good blood return noted before and after administration of chemotherapy.  Band aid applied.  Patient left in satisfactory condition with VSS and no s/s of distress noted. All follow ups as scheduled.   Marvin Carr [employer]

## 2023-08-14 LAB — T4: T4, Total: 6.1 ug/dL (ref 4.5–12.0)

## 2023-08-15 ENCOUNTER — Encounter: Payer: Self-pay | Admitting: Hematology

## 2023-08-27 ENCOUNTER — Ambulatory Visit: Payer: Medicare Other | Admitting: Neurology

## 2023-08-28 ENCOUNTER — Ambulatory Visit: Payer: Medicare Other | Admitting: Student

## 2023-08-30 ENCOUNTER — Other Ambulatory Visit: Payer: Self-pay | Admitting: Hematology

## 2023-08-31 ENCOUNTER — Telehealth: Payer: Self-pay

## 2023-08-31 DIAGNOSIS — R55 Syncope and collapse: Secondary | ICD-10-CM

## 2023-08-31 NOTE — Telephone Encounter (Signed)
Spoke to pt who stated that he is still currently having episodes. Pt is agreeable with having referral placed for neuro consult. Nurse visit made for orthostatic bp's.

## 2023-08-31 NOTE — Telephone Encounter (Signed)
-----   Message from Jacolyn Reedy sent at 08/30/2023  9:04 AM EST ----- First monitor was normal with rare skips. Second monitor mostly NSR some skips and 35 episodes of brief fast heart rates, no slow rates and likely not causing his "falling asleep episodes. We could give him something for fast heart rates but he was orthostatic when I saw him. He was supposed to return for orthostatic BP check. Can you have him come in for a nurse visit? Also ask him if he's still having those episodes? If he is he needs neurology referral.

## 2023-09-03 ENCOUNTER — Other Ambulatory Visit: Payer: Self-pay | Admitting: *Deleted

## 2023-09-03 MED ORDER — DOXYCYCLINE HYCLATE 100 MG PO TABS: 100.0000 mg | ORAL_TABLET | Freq: Two times a day (BID) | ORAL | 0 refills | Status: AC

## 2023-09-03 NOTE — Telephone Encounter (Signed)
Please advise 

## 2023-09-06 ENCOUNTER — Encounter: Payer: Self-pay | Admitting: Cardiology

## 2023-09-06 ENCOUNTER — Ambulatory Visit: Payer: Medicare Other | Attending: Cardiology

## 2023-09-06 DIAGNOSIS — R55 Syncope and collapse: Secondary | ICD-10-CM

## 2023-09-06 NOTE — Progress Notes (Signed)
Patient presents today for a nurse visit- orthostatic bp's per Leda Gauze, PA.  Per providers last office note on 07/30/2023:  Periods of falling asleep vs presyncope? No dizziness but he describes as sudden falling asleep- one episode he caught on webcam has lasted 10 min. It also occurs while standing-has dropped a dinner plate. BP running low and he is orthostatic in the office today. Will check 2 week zio. Stop amlodipine and Zaroxolyn(fluid issues not a problem lately). Return in 1 week for a nurse visit for orthostatic BP's. Check bmet and cbc today.   Patient stated that he may have blacked out 3-4 times over the last several days but have only lasted seconds. Pt stated they seem to come about when he starts daydreaming.   Orthostatic Bp's:  Lying- 135/81; hr- 82  Sitting- 135/79; hr- 82  Standing- 113/71; hr- 82   Will route nurse visit to covering DOD and ordering provider.

## 2023-09-06 NOTE — Patient Instructions (Signed)
Medication Instructions:  Your physician recommends that you continue on your current medications as directed. Please refer to the Current Medication list given to you today.  *If you need a refill on your cardiac medications before your next appointment, please call your pharmacy*   Lab Work: None If you have labs (blood work) drawn today and your tests are completely normal, you will receive your results only by: MyChart Message (if you have MyChart) OR A paper copy in the mail If you have any lab test that is abnormal or we need to change your treatment, we will call you to review the results.   Testing/Procedures: None   Follow-Up: At [facility], you and your health needs are our priority.  As part of our continuing mission to provide you with exceptional heart care, we have created designated Provider Care Teams.  These Care Teams include your primary Cardiologist (physician) and Advanced Practice Providers (APPs -  Physician Assistants and Nurse Practitioners) who all work together to provide you with the care you need, when you need it.  We recommend signing up for the patient portal called "MyChart".  Sign up information is provided on this After Visit Summary.  MyChart is used to connect with patients for Virtual Visits (Telemedicine).  Patients are able to view lab/test results, encounter notes, upcoming appointments, etc.  Non-urgent messages can be sent to your provider as well.   To learn more about what you can do with MyChart, go to [email or web address].    Your next appointment:    Keep your scheduled follow ups

## 2023-09-10 ENCOUNTER — Encounter: Payer: Self-pay | Admitting: Internal Medicine

## 2023-09-10 NOTE — Telephone Encounter (Signed)
Needs ov and in meatime needs ONO on RA while on CPAP settings per Neurology

## 2023-09-10 NOTE — Progress Notes (Signed)
Patient notified via MyChart. Medication listed updated.

## 2023-09-13 ENCOUNTER — Inpatient Hospital Stay: Payer: Medicare Other | Attending: Hematology

## 2023-09-13 ENCOUNTER — Inpatient Hospital Stay: Payer: Medicare Other | Admitting: Hematology

## 2023-09-13 ENCOUNTER — Inpatient Hospital Stay: Payer: Medicare Other

## 2023-09-13 VITALS — BP 128/75 | HR 77 | Temp 97.6°F | Resp 20 | Wt 297.0 lb

## 2023-09-13 VITALS — BP 134/79 | HR 83 | Temp 96.6°F | Resp 20

## 2023-09-13 DIAGNOSIS — R0602 Shortness of breath: Secondary | ICD-10-CM | POA: Insufficient documentation

## 2023-09-13 DIAGNOSIS — Z79899 Other long term (current) drug therapy: Secondary | ICD-10-CM | POA: Insufficient documentation

## 2023-09-13 DIAGNOSIS — Z95828 Presence of other vascular implants and grafts: Secondary | ICD-10-CM

## 2023-09-13 DIAGNOSIS — M25473 Effusion, unspecified ankle: Secondary | ICD-10-CM | POA: Diagnosis not present

## 2023-09-13 DIAGNOSIS — E876 Hypokalemia: Secondary | ICD-10-CM | POA: Diagnosis not present

## 2023-09-13 DIAGNOSIS — M545 Low back pain, unspecified: Secondary | ICD-10-CM | POA: Insufficient documentation

## 2023-09-13 DIAGNOSIS — G479 Sleep disorder, unspecified: Secondary | ICD-10-CM | POA: Insufficient documentation

## 2023-09-13 DIAGNOSIS — Z79620 Long term (current) use of immunosuppressive biologic: Secondary | ICD-10-CM | POA: Diagnosis not present

## 2023-09-13 DIAGNOSIS — Z8551 Personal history of malignant neoplasm of bladder: Secondary | ICD-10-CM | POA: Diagnosis not present

## 2023-09-13 DIAGNOSIS — G629 Polyneuropathy, unspecified: Secondary | ICD-10-CM | POA: Diagnosis not present

## 2023-09-13 DIAGNOSIS — C221 Intrahepatic bile duct carcinoma: Secondary | ICD-10-CM | POA: Diagnosis present

## 2023-09-13 DIAGNOSIS — Z5112 Encounter for antineoplastic immunotherapy: Secondary | ICD-10-CM | POA: Diagnosis present

## 2023-09-13 DIAGNOSIS — Z87891 Personal history of nicotine dependence: Secondary | ICD-10-CM | POA: Diagnosis not present

## 2023-09-13 DIAGNOSIS — R970 Elevated carcinoembryonic antigen [CEA]: Secondary | ICD-10-CM | POA: Insufficient documentation

## 2023-09-13 LAB — COMPREHENSIVE METABOLIC PANEL
ALT: 22 U/L (ref 0–44)
AST: 23 U/L (ref 15–41)
Albumin: 3.4 g/dL — ABNORMAL LOW (ref 3.5–5.0)
Alkaline Phosphatase: 65 U/L (ref 38–126)
Anion gap: 8 (ref 5–15)
BUN: 25 mg/dL — ABNORMAL HIGH (ref 8–23)
CO2: 24 mmol/L (ref 22–32)
Calcium: 9 mg/dL (ref 8.9–10.3)
Chloride: 102 mmol/L (ref 98–111)
Creatinine, Ser: 1.01 mg/dL (ref 0.61–1.24)
GFR, Estimated: >60 mL/min (ref >60)
Glucose, Bld: 109 mg/dL — ABNORMAL HIGH (ref 70–99)
Potassium: 4 mmol/L (ref 3.5–5.1)
Sodium: 134 mmol/L — ABNORMAL LOW (ref 135–145)
Total Bilirubin: 0.3 mg/dL (ref 0.0–1.2)
Total Protein: 6.8 g/dL (ref 6.5–8.1)

## 2023-09-13 LAB — MAGNESIUM: Magnesium: 2.1 mg/dL (ref 1.7–2.4)

## 2023-09-13 LAB — CBC WITH DIFFERENTIAL/PLATELET
Abs Immature Granulocytes: 0.06 10*3/uL (ref 0.00–0.07)
Basophils Absolute: 0.1 10*3/uL (ref 0.0–0.1)
Basophils Relative: 1 %
Eosinophils Absolute: 0.3 10*3/uL (ref 0.0–0.5)
Eosinophils Relative: 5 %
HCT: 38.2 % — ABNORMAL LOW (ref 39.0–52.0)
Hemoglobin: 12.4 g/dL — ABNORMAL LOW (ref 13.0–17.0)
Immature Granulocytes: 1 %
Lymphocytes Relative: 18 %
Lymphs Abs: 1.1 10*3/uL (ref 0.7–4.0)
MCH: 29.3 pg (ref 26.0–34.0)
MCHC: 32.5 g/dL (ref 30.0–36.0)
MCV: 90.3 fL (ref 80.0–100.0)
Monocytes Absolute: 0.7 10*3/uL (ref 0.1–1.0)
Monocytes Relative: 12 %
Neutro Abs: 3.9 10*3/uL (ref 1.7–7.7)
Neutrophils Relative %: 63 %
Platelets: 290 10*3/uL (ref 150–400)
RBC: 4.23 MIL/uL (ref 4.22–5.81)
RDW: 14 % (ref 11.5–15.5)
WBC: 6.2 10*3/uL (ref 4.0–10.5)
nRBC: 0 % (ref 0.0–0.2)

## 2023-09-13 LAB — TSH: TSH: 2.655 u[IU]/mL (ref 0.350–4.500)

## 2023-09-13 MED ORDER — SODIUM CHLORIDE 0.9 % IV SOLN: Freq: Once | INTRAVENOUS | Status: AC

## 2023-09-13 MED ORDER — HEPARIN SOD (PORK) LOCK FLUSH 100 UNIT/ML IV SOLN
500.0000 [IU] | Freq: Once | INTRAVENOUS | Status: AC | PRN
  Administered 2023-09-13: 500 [IU]

## 2023-09-13 MED ORDER — SODIUM CHLORIDE 0.9% FLUSH
10.0000 mL | Freq: Once | INTRAVENOUS | Status: AC
  Administered 2023-09-13: 10 mL via INTRAVENOUS

## 2023-09-13 MED ORDER — SODIUM CHLORIDE 0.9% FLUSH
10.0000 mL | INTRAVENOUS | Status: DC | PRN
  Administered 2023-09-13: 10 mL

## 2023-09-13 MED ORDER — DURVALUMAB 500 MG/10ML IV SOLN
1500.0000 mg | Freq: Once | INTRAVENOUS | Status: AC
  Administered 2023-09-13: 1500 mg via INTRAVENOUS
  Filled 2023-09-13: qty 30

## 2023-09-13 NOTE — Progress Notes (Signed)
Patient presents today for chemotherapy infusion.  Patient is in satisfactory condition with no new complaints voiced.  Vital signs are stable.  Labs reviewed and all labs are within treatment parameters. We will proceed with treatment per MD orders.   Patient tolerated treatment well with no complaints voiced.  Patient left ambulatory in stable condition.  Vital signs stable at discharge.  Follow up as scheduled.

## 2023-09-13 NOTE — Patient Instructions (Signed)
 [facility] - A DEPT OF [facility]  Discharge Instructions: Thank you for choosing [facility] to provide your oncology and hematology care.  If you have a lab appointment with the Cancer Center - please note that after April 8th, 2024, all labs will be drawn in the cancer center.  You do not have to check in or register with the main entrance as you have in the past but will complete your check-in in the cancer center.  Wear comfortable clothing and clothing appropriate for easy access to any Portacath or PICC line.   We strive to give you quality time with your provider. You may need to reschedule your appointment if you arrive late (15 or more minutes).  Arriving late affects you and other patients whose appointments are after yours.  Also, if you miss three or more appointments without notifying the office, you may be dismissed from the clinic at the provider's discretion.      For prescription refill requests, have your pharmacy contact our office and allow 72 hours for refills to be completed.    Today you received the following chemotherapy and/or immunotherapy agents Imfinzi.  Durvalumab Injection What is this medication? DURVALUMAB (dur VAL ue mab) treats some types of cancer. It works by helping your immune system slow or stop the spread of cancer cells. It is a monoclonal antibody. This medicine may be used for other purposes; ask your health care provider or pharmacist if you have questions. COMMON BRAND NAME(S): IMFINZI What should I tell my care team before I take this medication? They need to know if you have any of these conditions: Allogeneic stem cell transplant (uses someone else's stem cells) Autoimmune diseases, such as Crohn disease, ulcerative colitis, lupus History of chest radiation Nervous system problems, such as Guillain-Barre syndrome, myasthenia gravis Organ transplant An unusual or allergic reaction to durvalumab,  other medications, foods, dyes, or preservatives Pregnant or trying to get pregnant Breast-feeding How should I use this medication? This medication is infused into a vein. It is given by your care team in a hospital or clinic setting. A special MedGuide will be given to you before each treatment. Be sure to read this information carefully each time. Talk to your care team about the use of this medication in children. Special care may be needed. Overdosage: If you think you have taken too much of this medicine contact a poison control center or emergency room at once. NOTE: This medicine is only for you. Do not share this medicine with others. What if I miss a dose? Keep appointments for follow-up doses. It is important not to miss your dose. Call your care team if you are unable to keep an appointment. What may interact with this medication? Interactions have not been studied. This list may not describe all possible interactions. Give your health care provider a list of all the medicines, herbs, non-prescription drugs, or dietary supplements you use. Also tell them if you smoke, drink alcohol, or use illegal drugs. Some items may interact with your medicine. What should I watch for while using this medication? Your condition will be monitored carefully while you are receiving this medication. You may need blood work while taking this medication. This medication may cause serious skin reactions. They can happen weeks to months after starting the medication. Contact your care team right away if you notice fevers or flu-like symptoms with a rash. The rash may be red or  purple and then turn into blisters or peeling of the skin. You may also notice a red rash with swelling of the face, lips, or lymph nodes in your neck or under your arms. Tell your care team right away if you have any change in your eyesight. Talk to your care team if you may be pregnant. Serious birth defects can occur if you take  this medication during pregnancy and for 3 months after the last dose. You will need a negative pregnancy test before starting this medication. Contraception is recommended while taking this medication and for 3 months after the last dose. Your care team can help you find the option that works for you. Do not breastfeed while taking this medication and for 3 months after the last dose. What side effects may I notice from receiving this medication? Side effects that you should report to your care team as soon as possible: Allergic reactions--skin rash, itching, hives, swelling of the face, lips, tongue, or throat Dry cough, shortness of breath or trouble breathing Eye pain, redness, irritation, or discharge with blurry or decreased vision Heart muscle inflammation--unusual weakness or fatigue, shortness of breath, chest pain, fast or irregular heartbeat, dizziness, swelling of the ankles, feet, or hands Hormone gland problems--headache, sensitivity to light, unusual weakness or fatigue, dizziness, fast or irregular heartbeat, increased sensitivity to cold or heat, excessive sweating, constipation, hair loss, increased thirst or amount of urine, tremors or shaking, irritability Infusion reactions--chest pain, shortness of breath or trouble breathing, feeling faint or lightheaded Kidney injury (glomerulonephritis)--decrease in the amount of urine, red or dark brown urine, foamy or bubbly urine, swelling of the ankles, hands, or feet Liver injury--right upper belly pain, loss of appetite, nausea, light-colored stool, dark yellow or brown urine, yellowing skin or eyes, unusual weakness or fatigue Pain, tingling, or numbness in the hands or feet, muscle weakness, change in vision, confusion or trouble speaking, loss of balance or coordination, trouble walking, seizures Rash, fever, and swollen lymph nodes Redness, blistering, peeling, or loosening of the skin, including inside the mouth Sudden or severe  stomach pain, bloody diarrhea, fever, nausea, vomiting Side effects that usually do not require medical attention (report these to your care team if they continue or are bothersome): Bone, joint, or muscle pain Diarrhea Fatigue Loss of appetite Nausea Skin rash This list may not describe all possible side effects. Call your doctor for medical advice about side effects. You may report side effects to FDA at 1-800-FDA-1088. Where should I keep my medication? This medication is given in a hospital or clinic. It will not be stored at home. NOTE: This sheet is a summary. It may not cover all possible information. If you have questions about this medicine, talk to your doctor, pharmacist, or health care provider.  2024 Elsevier/Gold Standard (2022-01-10 00:00:00)        To help prevent nausea and vomiting after your treatment, we encourage you to take your nausea medication as directed.  BELOW ARE SYMPTOMS THAT SHOULD BE REPORTED IMMEDIATELY: *FEVER GREATER THAN 100.4 F (38 C) OR HIGHER *CHILLS OR SWEATING *NAUSEA AND VOMITING THAT IS NOT CONTROLLED WITH YOUR NAUSEA MEDICATION *UNUSUAL SHORTNESS OF BREATH *UNUSUAL BRUISING OR BLEEDING *URINARY PROBLEMS (pain or burning when urinating, or frequent urination) *BOWEL PROBLEMS (unusual diarrhea, constipation, pain near the anus) TENDERNESS IN MOUTH AND THROAT WITH OR WITHOUT PRESENCE OF ULCERS (sore throat, sores in mouth, or a toothache) UNUSUAL RASH, SWELLING OR PAIN  UNUSUAL VAGINAL DISCHARGE OR ITCHING   Items  with * indicate a potential emergency and should be followed up as soon as possible or go to the Emergency Department if any problems should occur.  Please show the CHEMOTHERAPY ALERT CARD or IMMUNOTHERAPY ALERT CARD at check-in to the Emergency Department and triage nurse.  Should you have questions after your visit or need to cancel or reschedule your appointment, please contact [facility] [place] - A DEPT OF [facility] [phone]  and follow the prompts.  Office hours are 8:00 a.m. to 4:30 p.m. Monday - Friday. Please note that voicemails left after 4:00 p.m. may not be returned until the following business day.  We are closed weekends and major holidays. You have access to a nurse at all times for urgent questions. Please call the main number to the clinic [phone] and follow the prompts.  For any non-urgent questions, you may also contact your provider using MyChart. We now offer e-Visits for anyone 68 and older to request care online for non-urgent symptoms. For details visit [email or web address].   Also download the MyChart app! Go to the app store, search "MyChart", open the app, select [place], and log in with your MyChart username and password.

## 2023-09-14 LAB — T4: T4, Total: 7 ug/dL (ref 4.5–12.0)

## 2023-09-14 NOTE — Telephone Encounter (Signed)
 Pt has an appt on the 09/25/23

## 2023-09-19 ENCOUNTER — Ambulatory Visit (HOSPITAL_COMMUNITY)
Admission: RE | Admit: 2023-09-19 | Discharge: 2023-09-19 | Disposition: A | Discharge status: Home / Self Care | Payer: Medicare Other | Source: Ambulatory Visit | Attending: Family Medicine | Admitting: Family Medicine

## 2023-09-19 ENCOUNTER — Other Ambulatory Visit (HOSPITAL_COMMUNITY): Payer: Self-pay | Admitting: Family Medicine

## 2023-09-19 DIAGNOSIS — M25552 Pain in left hip: Secondary | ICD-10-CM

## 2023-09-23 NOTE — Progress Notes (Signed)
 Marvin Carr, male    DOB: 28-Mar-1956    MRN: [id number]   Brief patient profile:  67  yowm  quit smoking 1980  @ 175 lbs  referred to pulmonary clinic in [place]  09/25/2023 by Dr Shona for doe x  2020.   Wt  08/2021  = 266 lb   Last chemo Oct 2023  last immuno rx 1st of the cone  per K    History of Present Illness  09/25/2023  Pulmonary/ 1st office eval/ Marvin Carr / [place] [facility] @ wt = 296lb Chief Complaint  Patient presents with   Establish Care   Shortness of Breath  Dyspnea:  limited by L  hip more so than breathing x 2 weeks  Cough: occ / dry  Sleep: flat bed / 2 pillows insomnia x college / on cpap  SABA use: none  02: none    No obvious day to day or daytime pattern/variability or assoc excess/ purulent sputum or mucus plugs or hemoptysis or cp or chest tightness, subjective wheeze or overt sinus or hb symptoms.    Also denies any obvious fluctuation of symptoms with weather or environmental changes or other aggravating or alleviating factors except as outlined above   No unusual exposure hx or h/o childhood pna/ asthma or knowledge of premature birth.  Current Allergies, Complete Past Medical History, Past Surgical History, Family History, and Social History were reviewed in [employer] record.  ROS  The following are not active complaints unless bolded Hoarseness, sore throat, dysphagia, dental problems, itching, sneezing,  nasal congestion or discharge of excess mucus or purulent secretions, ear ache,   fever, chills, sweats, unintended wt loss or wt gain, classically pleuritic or exertional cp,  orthopnea pnd or arm/hand swelling  or leg swelling, presyncope, palpitations, abdominal pain, anorexia, nausea, vomiting, diarrhea  or change in bowel habits or change in bladder habits, change in stools or change in urine, dysuria, hematuria,  rash, arthralgias, visual complaints, headache, numbness, weakness or ataxia or problems with walking or  coordination,  change in mood or  memory.            Outpatient Medications Prior to Visit  Medication Sig Dispense Refill   aspirin  EC 81 MG tablet Take 81 mg by mouth daily.     buPROPion  (WELLBUTRIN  XL) 300 MG 24 hr tablet Take 300 mg by mouth daily.     cyclobenzaprine  (FLEXERIL ) 10 MG tablet Take 10 mg by mouth at bedtime as needed for muscle spasms.     Durvalumab  (IMFINZI  IV) Inject into the vein every 21 ( twenty-one) days.     Eszopiclone 3 MG TABS Take 3 mg by mouth at bedtime. Take immediately before bedtime     famotidine  (PEPCID ) 20 MG tablet Take 20 mg by mouth at bedtime.     fenofibrate  160 MG tablet Take 160 mg by mouth daily.     FLUoxetine  (PROZAC ) 10 MG capsule Take 10 mg by mouth daily.     gabapentin  (NEURONTIN ) 300 MG capsule Take 300 mg by mouth every 6 (six) hours as needed.     magnesium  oxide (MAG-OX) 400 (240 Mg) MG tablet Take 1 tablet (400 mg total) by mouth 2 (two) times daily. 90 tablet 6   metFORMIN  (GLUCOPHAGE -XR) 500 MG 24 hr tablet Take 500 mg by mouth 2 (two) times daily.     nitroGLYCERIN  (NITROSTAT ) 0.4 MG SL tablet Place 0.4 mg under the tongue every 5 (five) minutes x 3 doses  as needed for chest pain (if no relief after 2nd dose, proceed to ED or call 911).     omeprazole (PRILOSEC) 40 MG capsule Take 40 mg by mouth daily.     pantoprazole  (PROTONIX ) 40 MG tablet Take 1 tablet (40 mg total) by mouth daily. 90 tablet 0   potassium chloride  SA (KLOR-CON  M) 20 MEQ tablet Take 40 mEq by mouth daily.     pramipexole  (MIRAPEX ) 0.75 MG tablet Take 1 tablet (0.75 mg total) by mouth 3 (three) times daily. 90 tablet 0   RYBELSUS  7 MG TABS Take 14 mg by mouth daily.     tamsulosin  (FLOMAX ) 0.4 MG CAPS capsule Take 0.4 mg by mouth daily.     traMADol  (ULTRAM ) 50 MG tablet Take 50 mg by mouth 4 (four) times daily as needed for moderate pain.     zaleplon  (SONATA ) 10 MG capsule Take 1 capsule (10 mg total) by mouth at bedtime as needed for sleep. 30 capsule 5    bumetanide  (BUMEX ) 2 MG tablet TAKE (1) TABLET BY MOUTH EACH MORNING. (Patient not taking: Reported on 09/25/2023) 30 tablet 2   metolazone  (ZAROXOLYN ) 5 MG tablet Take 1 tablet (5 mg total) by mouth 3 (three) times a week. 08/02/2023 HOLD-if swelling re-occurs, restart and contact office (Patient not taking: Reported on 09/25/2023)     ALPRAZolam  (XANAX ) 0.5 MG tablet Take 1 tablet (0.5 mg total) by mouth at bedtime as needed for anxiety. 3 tablet 0   amLODipine  (NORVASC ) 5 MG tablet Take 5 mg by mouth daily.     olmesartan  (BENICAR ) 40 MG tablet TAKE (1) TABLET BY MOUTH ONCE DAILY. 30 tablet 5   Facility-Administered Medications Prior to Visit  Medication Dose Route Frequency Provider Last Rate Last Admin   magnesium  sulfate 2 GM/50ML IVPB             Past Medical History:  Diagnosis Date   Anxiety    Bladder cancer (HCC) 09/11/2009   CAD (coronary artery disease), native coronary artery    a. Mildly elevated troponin 03/2013, cath with nonobstructive disease including 50% AV groove distal stenosis before large OM   Essential hypertension    Headache(784.0)    History of migraines   Hyperglycemia    Mixed hyperlipidemia    Neuropathy    Obesity    Port-A-Cath in place 09/30/2021   Pre-diabetes    Seasonal allergies    Sleep apnea    On CPAP      Objective:     BP (!) 153/83   Pulse 86   Ht 6' (1.829 m)   Wt 296 lb (134.3 kg)   SpO2 96%   BMI 40.14 kg/m   SpO2: 96 % RA   Wt Readings from Last 3 Encounters:  09/25/23 296 lb (134.3 kg)  09/13/23 297 lb (134.7 kg)  08/13/23 289 lb 3.9 oz (131.2 kg)      Pleasant amb wm mild hoarseness    HEENT : Oropharynx  clear      Nasal turbinates nl    NECK :  without  apparent JVD/ palpable Nodes/TM    LUNGS: no acc muscle use,  Nl contour chest which is clear to A and P bilaterally without cough on insp or exp maneuvers   CV:  RRR  no s3 or murmur or increase in P2, and no edema   ABD:  quite obese soft and nontender    MS:  Gait slt awkward shifting wt side to side  ext warm without deformities Or obvious joint restrictions  calf tenderness, cyanosis or clubbing    SKIN: warm and dry without lesions    NEURO:  alert, approp, nl sensorium with  no motor or cerebellar deficits apparent.        I personally reviewed images and agree with radiology impression as follows:  Chest CTa    06/01/23  Mediastinum/Nodes: No enlarged mediastinal, hilar, or axillary lymph nodes. Thyroid  gland, trachea, and esophagus demonstrate no significant findings.  Lungs/Pleura: Lungs are clear. No pleural effusion or pneumothorax.       Assessment   DOE (dyspnea on exertion) Onset 2020  - Echo 04/2023  nl  - 09/25/2023   Walked on RA  x  3  lap(s) =  approx 450  ft  @ mod  pace, stopped due to end of study  with lowest 02 sats 93% but improved to 96% before stopped at end    Improvement with stas with ex is typical of a pt with MO with atx in bases from poor diaprhragm excursion due to poor abd compliance causing v/q mismatch until upright and taking deeper breaths but does not warrant additonal w/u.  Given CTa and walking study today I don't rec  additional pulmonary w/u  Advised: Make sure you check your oxygen saturation at your highest level of activity(NOT after you stop)  to be sure it stays over 90% and keep track of it at least once a week, more often if breathing getting worse, and let me know if losing ground. (Collect the dots to connect the dots approach)        Morbid obesity due to excess calories (HCC) Body mass index is 40.14 kg/m.  -  trending up  Lab Results  Component Value Date   TSH 2.655 09/13/2023      Contributing to doe and risk of GERD/dvt/PE >>>   reviewed the need and the process to achieve and maintain neg calorie balance > defer f/u primary care including intermittently monitoring thyroid  status      Each maintenance medication was reviewed in detail including emphasizing most  importantly the difference between maintenance and prns and under what circumstances the prns are to be triggered using an action plan format where appropriate.  Total time for H and P, chart review, counseling,  directly observing portions of ambulatory 02 saturation study/ and generating customized AVS unique to this office visit / same day charting  > 60  min complex new pt eval with extensive oncology hx                   Ozell America, MD 09/25/2023

## 2023-09-24 NOTE — Telephone Encounter (Signed)
 Have pt contact us if weight does not return to baseline with dose of Bumex today. Tereso Newcomer, PA-C    09/24/2023 12:07 PM

## 2023-09-25 ENCOUNTER — Encounter: Payer: Self-pay | Admitting: Neurology

## 2023-09-25 ENCOUNTER — Ambulatory Visit: Payer: Medicare Other | Admitting: Internal Medicine

## 2023-09-25 ENCOUNTER — Encounter: Payer: Self-pay | Admitting: Internal Medicine

## 2023-09-25 VITALS — BP 153/83 | HR 86 | Ht 72.0 in | Wt 296.0 lb

## 2023-09-25 DIAGNOSIS — R0609 Other forms of dyspnea: Secondary | ICD-10-CM

## 2023-09-25 NOTE — Patient Instructions (Addendum)
 Pantoprazole  (protonix ) 40 mg  (or omeprazole 40)   Take  30-60 min before first meal of the day and Pepcid  (famotidine )  20 mg after supper or  until return to office - this is the best way to tell whether stomach acid is contributing to your problem.    GERD (REFLUX)  is an extremely common cause of respiratory symptoms just like yours , many times with no obvious heartburn at all.    It can be treated with medication, but also with lifestyle changes including elevation of the head of your bed (ideally with 6 -8inch blocks under the headboard of your bed),  Smoking cessation, avoidance of late meals (need 4 hour after you eat) , excessive alcohol, and avoid fatty foods, chocolate, peppermint, colas, red wine, and acidic juices such as orange juice.  NO MINT OR MENTHOL  PRODUCTS SO NO COUGH DROPS (Luden's ok)  USE SUGARLESS CANDY INSTEAD (Jolley ranchers or Stover's or Life Savers) or even ice chips will also do - the key is to swallow to prevent all throat clearing. NO OIL BASED VITAMINS - use powdered substitutes.  Avoid fish oil when coughing.   We will walk you today for baseline 0xygen saturation level  Make sure you check your oxygen saturation at your highest level of activity(NOT after you stop)  to be sure it stays over 90% and keep track of it at least once a week, more often if breathing getting worse, and let me know if losing ground. (Collect the dots to connect the dots approach)   Pulmonary follow up is as needed

## 2023-09-25 NOTE — Assessment & Plan Note (Signed)
 Body mass index is 40.14 kg/m.  -  trending up  Lab Results  Component Value Date   TSH 2.655 09/13/2023      Contributing to doe and risk of GERD/dvt/PE >>>   reviewed the need and the process to achieve and maintain neg calorie balance > defer f/u primary care including intermittently monitoring thyroid  status      Each maintenance medication was reviewed in detail including emphasizing most importantly the difference between maintenance and prns and under what circumstances the prns are to be triggered using an action plan format where appropriate.  Total time for H and P, chart review, counseling,  directly observing portions of ambulatory 02 saturation study/ and generating customized AVS unique to this office visit / same day charting  > 60  min complex new pt eval with extensive oncology hx

## 2023-09-25 NOTE — Assessment & Plan Note (Addendum)
 Onset 2020  - Echo 04/2023  nl  - 09/25/2023   Walked on RA  x  3  lap(s) =  approx 450  ft  @ mod  pace, stopped due to end of study  with lowest 02 sats 93% but improved to 96% before stopped at end    Improvement with stas with ex is typical of a pt with MO with atx in bases from poor diaprhragm excursion due to poor abd compliance causing v/q mismatch until upright and taking deeper breaths but does not warrant additonal w/u.  Given CTa and walking study today I don't rec  additional pulmonary w/u  Advised: Make sure you check your oxygen saturation at your highest level of activity(NOT after you stop)  to be sure it stays over 90% and keep track of it at least once a week, more often if breathing getting worse, and let me know if losing ground. (Collect the dots to connect the dots approach)

## 2023-10-02 ENCOUNTER — Ambulatory Visit (HOSPITAL_COMMUNITY)
Admission: RE | Admit: 2023-10-02 | Discharge: 2023-10-02 | Disposition: A | Discharge status: Home / Self Care | Payer: Medicare Other | Source: Ambulatory Visit | Attending: Hematology | Admitting: Hematology

## 2023-10-02 DIAGNOSIS — C221 Intrahepatic bile duct carcinoma: Secondary | ICD-10-CM | POA: Diagnosis present

## 2023-10-02 DIAGNOSIS — C787 Secondary malignant neoplasm of liver and intrahepatic bile duct: Secondary | ICD-10-CM | POA: Insufficient documentation

## 2023-10-02 MED ORDER — IOHEXOL 300 MG/ML  SOLN
100.0000 mL | Freq: Once | INTRAMUSCULAR | Status: AC | PRN
  Administered 2023-10-02: 100 mL via INTRAVENOUS

## 2023-10-09 ENCOUNTER — Other Ambulatory Visit: Payer: Self-pay | Admitting: Hematology

## 2023-10-09 ENCOUNTER — Encounter: Payer: Self-pay | Admitting: Student

## 2023-10-09 ENCOUNTER — Ambulatory Visit: Payer: Medicare Other | Attending: Student | Admitting: Student

## 2023-10-09 VITALS — BP 146/82 | HR 78 | Ht 72.0 in | Wt 292.4 lb

## 2023-10-09 DIAGNOSIS — G4733 Obstructive sleep apnea (adult) (pediatric): Secondary | ICD-10-CM | POA: Diagnosis present

## 2023-10-09 DIAGNOSIS — C786 Secondary malignant neoplasm of retroperitoneum and peritoneum: Secondary | ICD-10-CM | POA: Diagnosis present

## 2023-10-09 DIAGNOSIS — I2510 Atherosclerotic heart disease of native coronary artery without angina pectoris: Secondary | ICD-10-CM | POA: Diagnosis present

## 2023-10-09 DIAGNOSIS — I10 Essential (primary) hypertension: Secondary | ICD-10-CM | POA: Insufficient documentation

## 2023-10-09 DIAGNOSIS — R0602 Shortness of breath: Secondary | ICD-10-CM

## 2023-10-09 DIAGNOSIS — R55 Syncope and collapse: Secondary | ICD-10-CM | POA: Diagnosis present

## 2023-10-09 MED ORDER — AMLODIPINE BESYLATE 10 MG PO TABS: 10.0000 mg | ORAL_TABLET | Freq: Every day | ORAL | 3 refills | Status: DC

## 2023-10-09 MED ORDER — BUMETANIDE 2 MG PO TABS: 2.0000 mg | ORAL_TABLET | Freq: Every day | ORAL | Status: DC | PRN

## 2023-10-09 NOTE — Progress Notes (Signed)
Cardiology Office Note    Date:  10/09/2023  ID:  Marvin Carr, DOB [birth year], MRN [id number] Cardiologist: Nona Dell, MD    History of Present Illness:    Marvin Carr is a 68 y.o. male with past medical history of CAD (nonobstructive disease by prior cardiac catheterization in 2014, low-risk NST in 02/2017), HTN, HLD, Type II DM and cholangiocarcinoma with peritoneal carcinomatosis who presents to the office today for 1 month follow-up.  He was last evaluated by Marvin Reedy, PA in 07/2023 following a recent ED evaluation for worsening shortness of breath. He reported episodes of sleepiness but denied any specific syncope. Blood pressure was low at 80/60. A 2-week Zio patch was recommended and it was also recommended to stop Amlodipine and Zaroxolyn. His Zio patch showed predominantly normal sinus rhythm with brief episodes of SVT with the longest lasting for 11 beats and rare PAC's and PVC's representing less than 1% of total beats. Was referred to Neurology for further evaluation.  He did have a nurse visit in 08/2023 and was still orthostatic, therefore was recommended to stop Bumex and only take as needed for worsening edema or weight gain.  Reports he was previously having episodes of unresponsiveness which would last for less than a second. He noticed this as he would drop a book or pen and would quickly wake back up. He did record himself with a web camera and feels like he was actually falling asleep with the episodes. This still occurs but has overall improved since he has been using his CPAP.  He denies any specific exertional chest pain or palpitations. No specific orthopnea, PND or pitting edema. He is having significant hip pain and recently received an injection with only temporary improvement. Is hopeful to expedite his follow-up with orthopedics so this can be addressed.  Studies Reviewed:   EKG: EKG is not ordered today.   Echocardiogram: 04/2023 IMPRESSIONS      1. Left ventricular ejection fraction, by estimation, is 60 to 65%. The  left ventricle has normal function. The left ventricle has no regional  wall motion abnormalities. There is mild asymmetric left ventricular  hypertrophy of the inferior segment. Left   ventricular diastolic parameters were normal.   2. Right ventricular systolic function is mildly reduced. The right  ventricular size is normal. Tricuspid regurgitation signal is inadequate  for assessing PA pressure.   3. The mitral valve is normal in structure. No evidence of mitral valve  regurgitation. No evidence of mitral stenosis.   4. The aortic valve is normal in structure. Aortic valve regurgitation is  not visualized. No aortic stenosis is present.   5. The inferior vena cava is normal in size with greater than 50%  respiratory variability, suggesting right atrial pressure of 3 mmHg.   Cardiac Telemetry: 08/2023 ZIO AT reviewed.  10 days, 23 hours analyzed.   Monitor #1 Predominant rhythm is sinus with heart rate ranging from 71 bpm up to 99 bpm and average heart rate 83 bpm. There were rare PACs representing less than 1% total beats.  No ventricular ectopy. No pauses or high degree heart block.   Monitor #2 Predominant rhythm is sinus with heart rate ranging from 38 bpm up to 118 bpm and average heart rate 82 bpm. There were rare PACs including atrial couplets and triplets representing less than 1% total beats. There were rare PVCs including ventricular couplets representing less than 1% total beats. Several (35) episodes of brief PSVT were noted, the longest  of which was 11 beats.  No sustained arrhythmias. No pauses or high degree heart block.    Risk Assessment/Calculations:     HYPERTENSION CONTROL Vitals:   10/09/23 1304 10/09/23 1338  BP: (!) 148/98 (!) 146/82    The patient's blood pressure is elevated above target today.  In order to address the patient's elevated BP: A current  anti-hypertensive medication was adjusted today.       Physical Exam:   VS:  BP (!) 146/82   Pulse 78   Ht 6' (1.829 m)   Wt 292 lb 6.4 oz (132.6 kg)   SpO2 96%   BMI 39.66 kg/m    Wt Readings from Last 3 Encounters:  10/09/23 292 lb 6.4 oz (132.6 kg)  09/25/23 296 lb (134.3 kg)  09/13/23 297 lb (134.7 kg)     GEN: Well nourished, well developed male appearing in no acute distress NECK: No JVD; No carotid bruits CARDIAC: RRR, no murmurs, rubs, gallops RESPIRATORY:  Clear to auscultation without rales, wheezing or rhonchi  ABDOMEN: Appears non-distended. No obvious abdominal masses. EXTREMITIES: No clubbing or cyanosis. No pitting edema.  Distal pedal pulses are 2+ bilaterally.   Assessment and Plan:   1. Presyncope/Orthostatic Hypotension - As discussed above, episodes have improved with him using his CPAP more routinely. Recent monitor showed rare SVT, PAC's and PVC's but no significant arrhythmias to account for his episodes and his symptoms were not associated with the brief arrhythmias. Blood pressure has improved and he is actually hypertensive with recent discontinuation of daily diuretic therapy. Will adjust his antihypertensive regimen as discussed below. Remains on PRN Bumex.   2. CAD - He had nonobstructive disease by prior cardiac catheterization in 2014 and a low-risk NST in 02/2017. No recent anginal symptoms. Remains on ASA. Previously on Lipitor but discontinued in the interim (LDL was at 58 when checked earlier this month by review of [facility] DXA).   3. Cholangiocarcinoma with peritoneal carcinomatosis  - Followed by Oncology and imaging in 05/2023 showed no evidence of malignancy but recent CT last week showed areas of subtle peritoneal nodularity which was concerning for spread of disease and he does have follow-up with Oncology later this week to review. Remains on Durvalumab.   4. HTN - BP was initially recorded at 148/98 with similar values by recheck. He  is currently taking Amlodipine 5 mg daily and Olmesartan 40 mg daily as diuretic therapy was previously discontinued. Given his elevated BP, will titrate Amlodipine to 10 mg daily. I encouraged him to make [place] aware if he develops worsening lower extremity edema with this as we would need to add a different blood pressure medication.  5. OSA - He has severe OSA and is followed by [facility]. Remains on CPAP therapy.  Signed, Ellsworth Lennox, PA-C

## 2023-10-09 NOTE — Patient Instructions (Signed)
Medication Instructions:  Your physician has recommended you make the following change in your medication:   Increase Norvasc to 10 mg Daily   Please complete BP log   *If you need a refill on your cardiac medications before your next appointment, please call your pharmacy*   Lab Work: NONE   If you have labs (blood work) drawn today and your tests are completely normal, you will receive your results only by: MyChart Message (if you have MyChart) OR A paper copy in the mail If you have any lab test that is abnormal or we need to change your treatment, we will call you to review the results.   Testing/Procedures: NONE    Follow-Up: At [facility], you and your health needs are our priority.  As part of our continuing mission to provide you with exceptional heart care, we have created designated Provider Care Teams.  These Care Teams include your primary Cardiologist (physician) and Advanced Practice Providers (APPs -  Physician Assistants and Nurse Practitioners) who all work together to provide you with the care you need, when you need it.  We recommend signing up for the patient portal called "MyChart".  Sign up information is provided on this After Visit Summary.  MyChart is used to connect with patients for Virtual Visits (Telemedicine).  Patients are able to view lab/test results, encounter notes, upcoming appointments, etc.  Non-urgent messages can be sent to your provider as well.   To learn more about what you can do with MyChart, go to [email or web address].    Your next appointment:   6 month(s)  Provider:   You may see Nona Dell, MD or one of the following Advanced Practice Providers on your designated Care Team:   Randall An, PA-C  Jacolyn Reedy, PA-C     Other Instructions Thank you for choosing [facility]!

## 2023-10-11 ENCOUNTER — Telehealth: Payer: Self-pay | Admitting: Pharmacy Technician

## 2023-10-11 ENCOUNTER — Inpatient Hospital Stay: Payer: Medicare Other

## 2023-10-11 ENCOUNTER — Inpatient Hospital Stay: Payer: Medicare Other | Admitting: Hematology

## 2023-10-11 VITALS — Wt 294.1 lb

## 2023-10-11 VITALS — BP 168/97 | HR 80 | Temp 97.3°F | Resp 20

## 2023-10-11 DIAGNOSIS — Z5112 Encounter for antineoplastic immunotherapy: Secondary | ICD-10-CM | POA: Diagnosis not present

## 2023-10-11 DIAGNOSIS — R970 Elevated carcinoembryonic antigen [CEA]: Secondary | ICD-10-CM

## 2023-10-11 DIAGNOSIS — C221 Intrahepatic bile duct carcinoma: Secondary | ICD-10-CM | POA: Diagnosis not present

## 2023-10-11 DIAGNOSIS — M25473 Effusion, unspecified ankle: Secondary | ICD-10-CM

## 2023-10-11 DIAGNOSIS — Z95828 Presence of other vascular implants and grafts: Secondary | ICD-10-CM

## 2023-10-11 DIAGNOSIS — G629 Polyneuropathy, unspecified: Secondary | ICD-10-CM | POA: Diagnosis not present

## 2023-10-11 DIAGNOSIS — E876 Hypokalemia: Secondary | ICD-10-CM

## 2023-10-11 DIAGNOSIS — G479 Sleep disorder, unspecified: Secondary | ICD-10-CM

## 2023-10-11 DIAGNOSIS — M545 Low back pain, unspecified: Secondary | ICD-10-CM

## 2023-10-11 DIAGNOSIS — Z5986 Financial insecurity: Secondary | ICD-10-CM

## 2023-10-11 LAB — CBC WITH DIFFERENTIAL/PLATELET
Abs Immature Granulocytes: 0.09 10*3/uL — ABNORMAL HIGH (ref 0.00–0.07)
Basophils Absolute: 0 10*3/uL (ref 0.0–0.1)
Basophils Relative: 0 %
Eosinophils Absolute: 0.1 10*3/uL (ref 0.0–0.5)
Eosinophils Relative: 1 %
HCT: 42.1 % (ref 39.0–52.0)
Hemoglobin: 13.7 g/dL (ref 13.0–17.0)
Immature Granulocytes: 1 %
Lymphocytes Relative: 10 %
Lymphs Abs: 1 10*3/uL (ref 0.7–4.0)
MCH: 29.1 pg (ref 26.0–34.0)
MCHC: 32.5 g/dL (ref 30.0–36.0)
MCV: 89.4 fL (ref 80.0–100.0)
Monocytes Absolute: 0.7 10*3/uL (ref 0.1–1.0)
Monocytes Relative: 7 %
Neutro Abs: 7.9 10*3/uL — ABNORMAL HIGH (ref 1.7–7.7)
Neutrophils Relative %: 81 %
Platelets: 288 10*3/uL (ref 150–400)
RBC: 4.71 MIL/uL (ref 4.22–5.81)
RDW: 14.5 % (ref 11.5–15.5)
WBC: 9.8 10*3/uL (ref 4.0–10.5)
nRBC: 0 % (ref 0.0–0.2)

## 2023-10-11 LAB — COMPREHENSIVE METABOLIC PANEL
ALT: 22 U/L (ref 0–44)
AST: 19 U/L (ref 15–41)
Albumin: 3.7 g/dL (ref 3.5–5.0)
Alkaline Phosphatase: 78 U/L (ref 38–126)
Anion gap: 9 (ref 5–15)
BUN: 32 mg/dL — ABNORMAL HIGH (ref 8–23)
CO2: 24 mmol/L (ref 22–32)
Calcium: 9.3 mg/dL (ref 8.9–10.3)
Chloride: 101 mmol/L (ref 98–111)
Creatinine, Ser: 0.94 mg/dL (ref 0.61–1.24)
GFR, Estimated: >60 mL/min (ref >60)
Glucose, Bld: 115 mg/dL — ABNORMAL HIGH (ref 70–99)
Potassium: 4.2 mmol/L (ref 3.5–5.1)
Sodium: 134 mmol/L — ABNORMAL LOW (ref 135–145)
Total Bilirubin: 0.3 mg/dL (ref 0.0–1.2)
Total Protein: 6.7 g/dL (ref 6.5–8.1)

## 2023-10-11 LAB — TSH: TSH: 2.918 u[IU]/mL (ref 0.350–4.500)

## 2023-10-11 LAB — MAGNESIUM: Magnesium: 1.9 mg/dL (ref 1.7–2.4)

## 2023-10-11 MED ORDER — SODIUM CHLORIDE 0.9% FLUSH
10.0000 mL | INTRAVENOUS | Status: DC | PRN
  Administered 2023-10-11: 10 mL

## 2023-10-11 MED ORDER — SODIUM CHLORIDE 0.9 % IV SOLN: Freq: Once | INTRAVENOUS | Status: AC

## 2023-10-11 MED ORDER — HEPARIN SOD (PORK) LOCK FLUSH 100 UNIT/ML IV SOLN
500.0000 [IU] | Freq: Once | INTRAVENOUS | Status: AC | PRN
  Administered 2023-10-11: 500 [IU]

## 2023-10-11 MED ORDER — SODIUM CHLORIDE 0.9% FLUSH
10.0000 mL | Freq: Once | INTRAVENOUS | Status: AC
  Administered 2023-10-11: 10 mL via INTRAVENOUS

## 2023-10-11 MED ORDER — SODIUM CHLORIDE 0.9 % IV SOLN
1500.0000 mg | Freq: Once | INTRAVENOUS | Status: AC
  Administered 2023-10-11: 1500 mg via INTRAVENOUS
  Filled 2023-10-11: qty 30

## 2023-10-11 NOTE — Progress Notes (Signed)
Patient presents today for Imfinzi treatment and follow up with Dr. Ellin Saba . Vital signs and labs within parameters for treatment.   Message recevied from A.Dareen Piano RN / Dr.Katragadda to proceed with Imfinzi.   CEA and Cancer Antigen 19-9 drawn and sent to the lab prior to discharge.   Treatment given today per MD orders. Tolerated infusion without adverse affects. Vital signs stable. No complaints at this time. Discharged from clinic ambulatory in stable condition. Alert and oriented x 3. F/U with [facility] as scheduled.

## 2023-10-11 NOTE — Progress Notes (Signed)
Patients port flushed without difficulty.  Good blood return noted with no bruising or swelling noted at site.  Patient remains accessed for treatment.

## 2023-10-11 NOTE — Progress Notes (Addendum)
Pharmacy Medication Assistance Program Note    10/11/2023  Patient ID: Marvin Carr, male   DOB: [birth year], 68 y.o.   MRN: [id number]     10/11/2023  Outreach Medication One  Initial Outreach Date (Medication One) 07/16/2023  Manufacturer Medication One [employer] [facility] Rybelsus  Dose of Rybelsus 14mg   Type of Radiographer, therapeutic Assistance  Date Application Sent to Patient 07/18/2023  Application Items Requested Application;Proof of Income;Other  Date Application Sent to Prescriber 07/18/2023  Name of Prescriber Nita Sells  Date Application Received From Patient 10/11/2023  Application Items Received From Patient Application;Proof of Income  Date Application Received From Provider 08/06/2023  Date Application Submitted to Manufacturer 10/11/2023  Method Application Sent to Manufacturer Fax    Care coordination call placed to [employer] in regard to Rybelsus application. Per IVR system, information is missing. Successful outreach to patient. HIPAA verified. Patient informs he took the application to Dr. Scharlene Gloss office last week. Care coordination call placed to Dr. Scharlene Gloss office. Spoke to [place] who informs Alan Ripper faxed in the application to [employer] on 10/05/23. She agreed to fax to [facility] to help coordinate with [employer] what is missing. Re submitted application to [employer].  Pattricia Boss, CPhT [place]  Office: [phone] Fax: [phone] Email: [email or web address]  Signature

## 2023-10-11 NOTE — Patient Instructions (Addendum)
[facility] at [facility] Discharge Instructions   You were seen and examined today by Dr. Ellin Saba.  He reviewed the results of your lab work which are normal/stable.   We will proceed with your treatment today.  Return as scheduled.    Thank you for choosing [facility] at [facility] to provide your oncology and hematology care.  To afford each patient quality time with our provider, please arrive at least 15 minutes before your scheduled appointment time.   If you have a lab appointment with the Cancer Center please come in thru the Main Entrance and check in at the main information desk.  You need to re-schedule your appointment should you arrive 10 or more minutes late.  We strive to give you quality time with our providers, and arriving late affects you and other patients whose appointments are after yours.  Also, if you no show three or more times for appointments you may be dismissed from the clinic at the providers discretion.     Again, thank you for choosing [facility].  Our hope is that these requests will decrease the amount of time that you wait before being seen by our physicians.       _____________________________________________________________  Should you have questions after your visit to [facility], please contact our office at [phone] and follow the prompts.  Our office hours are 8:00 a.m. and 4:30 p.m. Monday - Friday.  Please note that voicemails left after 4:00 p.m. may not be returned until the following business day.  We are closed weekends and major holidays.  You do have access to a nurse 24-7, just call the main number to the clinic [phone] and do not press any options, hold on the line and a nurse will answer the phone.    For prescription refill requests, have your pharmacy contact our office and allow 72 hours.    Due to Covid, you will need to wear a mask upon entering  the hospital. If you do not have a mask, a mask will be given to you at the Main Entrance upon arrival. For doctor visits, patients may have 1 support person age 67 or older with them. For treatment visits, patients can not have anyone with them due to social distancing guidelines and our immunocompromised population.

## 2023-10-11 NOTE — Progress Notes (Signed)
[facility] [address] [place] [address]    Clinic Day:  10/11/2023  Referring physician: Benita Stabile, MD  Patient Care Team: Benita Stabile, MD as PCP - General (Internal Medicine) Jonelle Sidle, MD as PCP - Cardiology (Cardiology) Jonelle Sidle, MD as Consulting Physician (Cardiology) Doreatha Massed, MD as Medical Oncologist (Medical Oncology) [name], MD as Consulting Physician (Neurology) Nyoka Cowden, MD as Consulting Physician (Pulmonary Disease)   ASSESSMENT & PLAN:   Assessment: 1. Liver lesion/peritoneal carcinomatosis: - Patient seen at the request of Dr. [name] - Reported pressure on the sides of the abdomen with slight pain for the last 1 month.  He also reported pain in the epigastric region. - He had lost 20 pounds in the last 3 to 4 months intentionally, cutting back on sugars.  He was also started on semaglutide. - Colonoscopy on 06/28/2011 with a benign polypoid colonic mucosa in the descending colon.  No evidence of malignancy. - MRI of the brain was negative. - EGD and colonoscopy on 09/23/2018 did not reveal any malignancies. 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  IHC positive for CK7, CDX2 and negative for GATA3.  Findings suggestive of upper GI or pancreaticobiliary primary. - Cycle 1 of gemcitabine, cisplatin and durvalumab started on 10/05/2021. - NGS: No targetable mutations.  PD-L1 (IO962) negative.  MSI-stable.  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   2. Social/family history: - He lives at home by himself.  He records voiceover/narrations. - Non-smoker. - Father died of MDS.  Paternal grandfather had prostate cancer.  Maternal grandfather had cancer.  3.  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 Laverle Patter.  Reportedly received 1 treatment of intravesical chemo and has been on surveillance since then.  Last surveillance visit was  in 2020.    Plan: 1. Cholangiocarcinoma with peritoneal carcinomatosis: - He is tolerating durvalumab reasonably well.  No immunotherapy related side effects. - Reviewed CT CAP (10/02/2023): Subtle peritoneal nodularity worrisome for spread of disease.  No significant ascites.  Fatty liver and colonic diverticula. - I have reviewed images and compared with previous CT scan.  No significant progression.  I have recommended that we check his CEA level which was elevated previously.  CA 19-9 was always normal. - Recommend continue with durvalumab at this time.  If his CEA is elevated, will add gemcitabine to the regimen.  We also discussed possibility of using FOLFOX regimen.  He was not thrilled on wearing the pump every 2 weeks. - I have also recommended that we scan him in 2 months to see if this is real progression.    2. Fluid retention: - He is off of metolazone.  He is using Bumex as needed.  No ankle swellings reported.  3.  Neuropathy/"drawing" of legs: - Continue Mirapex 0.75 mg twice daily and gabapentin 300 mg 3 times daily.  4.  Sleeping difficulty: - Continue Sonata daily.  5.  Hypomagnesemia/hypokalemia: - He ran out of potassium 5 days ago.  His potassium is normal today at 4.2.  Recommend that he discontinue potassium.  Continue magnesium twice daily.  Magnesium is 1.9 today.   6.  Right-sided lower back pain: - MRI of the lumbar spine and pelvis on 05/30/2023 with degenerative disc disease and no evidence of malignancy.    Orders Placed This Encounter  Procedures   CT CHEST ABDOMEN PELVIS W CONTRAST    Standing Status:  Future    Expected Date:   11/26/2023    Expiration Date:   10/10/2024    If indicated for the ordered procedure, I authorize the administration of contrast media per Radiology protocol:   Yes    Does the patient have a contrast media/X-ray dye allergy?:   No    Preferred imaging location?:   [facility]    If indicated for the ordered  procedure, I authorize the administration of oral contrast media per Radiology protocol:   Yes   CEA    Standing Status:   Future    Number of Occurrences:   1    Expected Date:   10/11/2023    Expiration Date:   10/10/2024      Mikeal Hawthorne R Teague,acting as a scribe for Doreatha Massed, MD.,have documented all relevant documentation on the behalf of Doreatha Massed, MD,as directed by  Doreatha Massed, MD while in the presence of Doreatha Massed, MD.  I, Doreatha Massed MD, have reviewed the above documentation for accuracy and completeness, and I agree with the above.    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 male presenting to clinic today for follow up of cholangiocarcinoma and peritoneal carcinomatosis. He was last seen by me on 08/13/23.  Since his last visit, he underwent CT C/A/P on 10/02/23 that found: increasing developing areas of subtle peritoneal nodularity worrisome for spread of disease; no significant ascites; scattered mesenteric stranding; persistent wall thickening and stones in the gallbladder  with slight stranding; fatty liver infiltration; and colonic diverticula.   Today, he states that he is doing well overall. His appetite level is at 100%. His energy level is at 75%. He reports left hip pain due to arthritis and called [facility]. He received a cortisol injection in the hip which improved pain for 1.5 days. He has already undergone right hip arthroplasty and would like to receive left hip arthroplasty. He denies any abdominal pain, dry cough, or diarrhea. He reports band-like sensitivity along upper abdomen. His cardiologist would like for him to discontinue Bumex, and he would like to know if he could safely do so. He was taking Bumex once daily prior. He is taking Mirapex as prescribed, gabapentin 3 times a day, and magnesium 2 times day. He ran out of potassium 4-5 days ago, but he was previously taking it as prescribed.  PAST MEDICAL HISTORY:   Past Medical History: Past Medical History:  Diagnosis Date   Anxiety    Bladder cancer (HCC) 09/11/2009   CAD (coronary artery disease), native coronary artery    a. Mildly elevated troponin 03/2013, cath with nonobstructive  disease including 50% AV groove distal stenosis before large OM   Essential hypertension    Headache(784.0)    History of migraines   Hyperglycemia    Mixed hyperlipidemia    Neuropathy    Obesity    Port-A-Cath in place 09/30/2021   Pre-diabetes    Seasonal allergies    Sleep apnea    On CPAP    Surgical History: Past Surgical History:  Procedure Laterality Date   BIOPSY  09/23/2021   Procedure: BIOPSY;  Surgeon: Dolores Frame, MD;  [facility]: [facility];  Service: Gastroenterology;;   COLONOSCOPY  06/28/2011   Procedure: COLONOSCOPY;  Surgeon: Malissa Hippo, MD;  [facility]: [facility];  Service: Endoscopy;  Laterality: N/A;   COLONOSCOPY WITH PROPOFOL N/A 09/23/2021   Procedure: COLONOSCOPY WITH PROPOFOL;  Surgeon: Dolores Frame, MD;  Location: [facility];  Service:  Gastroenterology;  Laterality: N/A;  940   ESOPHAGOGASTRODUODENOSCOPY (EGD) WITH PROPOFOL N/A 09/23/2021   Procedure: ESOPHAGOGASTRODUODENOSCOPY (EGD) WITH PROPOFOL;  Surgeon: Dolores Frame, MD;  Location: [facility];  Service: Gastroenterology;  Laterality: N/A;   IR IMAGING GUIDED PORT INSERTION  09/28/2021   IR PARACENTESIS  09/28/2021   JOINT REPLACEMENT Right    hip   LEFT HEART CATHETERIZATION WITH CORONARY ANGIOGRAM N/A 03/31/2013   Procedure: LEFT HEART CATHETERIZATION WITH CORONARY ANGIOGRAM;  Surgeon: [name], MD;  Location: [facility] CATH LAB;  Service: Cardiovascular;  Laterality: N/A;   POLYPECTOMY  09/23/2021   Procedure: POLYPECTOMY;  Surgeon: Dolores Frame, MD;  Location: [facility];  Service: Gastroenterology;;   TURBT  09/11/2009    Social History: Social History   Socioeconomic History   Marital status: Divorced    Spouse name: Not on file   Number of children: 0   Years of education: college   Highest education level: Not on file  Occupational History    Employer: SELF-EMPLOYED  Tobacco Use   Smoking status: Former    Current packs/day: 0.00    Average packs/day: 0.5 packs/day for 10.0 years (5.0 ttl pk-yrs)    Types: Cigarettes    Start date: 07/09/2001    Quit date: 07/10/2011    Years since quitting: 12.2   Smokeless tobacco: Never   Tobacco comments:    Quit several yrs prior to 03/2013.  Vaping Use   Vaping status: Never Used  Substance and Sexual Activity   Alcohol use: Yes    Alcohol/week: 0.0 standard drinks of alcohol    Comment: Occasional   Drug use: No   Sexual activity: Not on file  Other Topics Concern   Not on file  Social History Narrative   Not on file   Social Drivers of Health   Financial Resource Strain: Not on file  Food Insecurity: No Food Insecurity (06/05/2022)   Hunger Vital Sign    Worried About Running Out of Food in the Last Year: Never true    Ran Out of Food in the Last Year: Never true   Transportation Needs: No Transportation Needs (06/05/2022)   PRAPARE - Administrator, Civil Service (Medical): No    Lack of Transportation (Non-Medical): No  Physical Activity: Not on file  Stress: Not on file  Social Connections: Not on file  Intimate Partner Violence: Not At Risk (06/05/2022)   Humiliation, Afraid, Rape, and Kick questionnaire    Fear of Current or Ex-Partner: No    Emotionally Abused: No    Physically Abused: No  Sexually Abused: No    Family History: Family History  Problem Relation Age of Onset   COPD Father     Current Medications:  Current Outpatient Medications:    amLODipine (NORVASC) 10 MG tablet, Take 1 tablet (10 mg total) by mouth daily., Disp: 90 tablet, Rfl: 3   aspirin EC 81 MG tablet, Take 81 mg by mouth daily., Disp: , Rfl:    bumetanide (BUMEX) 2 MG tablet, Take 1 tablet (2 mg total) by mouth daily as needed., Disp: , Rfl:    buPROPion (WELLBUTRIN XL) 300 MG 24 hr tablet, Take 300 mg by mouth daily., Disp: , Rfl:    cyclobenzaprine (FLEXERIL) 10 MG tablet, Take 10 mg by mouth at bedtime as needed for muscle spasms., Disp: , Rfl:    Durvalumab (IMFINZI IV), Inject into the vein every 21 ( twenty-one) days., Disp: , Rfl:    Eszopiclone 3 MG TABS, Take 3 mg by mouth at bedtime. Take immediately before bedtime, Disp: , Rfl:    famotidine (PEPCID) 20 MG tablet, Take 20 mg by mouth at bedtime., Disp: , Rfl:    fenofibrate 160 MG tablet, Take 160 mg by mouth daily., Disp: , Rfl:    FLUoxetine (PROZAC) 10 MG capsule, Take 10 mg by mouth daily., Disp: , Rfl:    gabapentin (NEURONTIN) 300 MG capsule, Take 300 mg by mouth every 6 (six) hours as needed., Disp: , Rfl:    magnesium oxide (MAG-OX) 400 (240 Mg) MG tablet, Take 1 tablet (400 mg total) by mouth 2 (two) times daily., Disp: 90 tablet, Rfl: 6   metFORMIN (GLUCOPHAGE-XR) 500 MG 24 hr tablet, Take 500 mg by mouth 2 (two) times daily., Disp: , Rfl:    nitroGLYCERIN (NITROSTAT) 0.4 MG SL  tablet, Place 0.4 mg under the tongue every 5 (five) minutes x 3 doses as needed for chest pain (if no relief after 2nd dose, proceed to ED or call 911)., Disp: , Rfl:    olmesartan (BENICAR) 40 MG tablet, Take 40 mg by mouth daily., Disp: , Rfl:    omeprazole (PRILOSEC) 40 MG capsule, Take 40 mg by mouth daily., Disp: , Rfl:    pantoprazole (PROTONIX) 40 MG tablet, Take 1 tablet (40 mg total) by mouth daily., Disp: 90 tablet, Rfl: 0   pramipexole (MIRAPEX) 0.75 MG tablet, Take 1 tablet (0.75 mg total) by mouth 3 (three) times daily., Disp: 90 tablet, Rfl: 0   predniSONE (DELTASONE) 10 MG tablet, Take 10 mg by mouth daily with breakfast., Disp: , Rfl:    RYBELSUS 7 MG TABS, Take 14 mg by mouth daily., Disp: , Rfl:    tamsulosin (FLOMAX) 0.4 MG CAPS capsule, Take 0.4 mg by mouth daily., Disp: , Rfl:    traMADol (ULTRAM) 50 MG tablet, Take 50 mg by mouth 4 (four) times daily as needed for moderate pain., Disp: , Rfl:    zaleplon (SONATA) 10 MG capsule, Take 1 capsule (10 mg total) by mouth at bedtime as needed for sleep., Disp: 30 capsule, Rfl: 5   potassium chloride SA (KLOR-CON M) 20 MEQ tablet, Take 1 tablet by mouth daily., Disp: , Rfl:  No current facility-administered medications for this visit.  Facility-Administered Medications Ordered in Other Visits:    magnesium sulfate 2 GM/50ML IVPB, , , ,    sodium chloride flush (NS) 0.9 % injection 10 mL, 10 mL, Intracatheter, PRN, Doreatha Massed, MD, 10 mL at 10/11/23 1609   Allergies: No Known Allergies  REVIEW OF SYSTEMS:  Review of Systems  Constitutional:  Negative for chills, fatigue and fever.  HENT:   Negative for lump/mass, mouth sores, nosebleeds, sore throat and trouble swallowing.   Eyes:  Negative for eye problems.  Respiratory:  Negative for cough and shortness of breath.   Cardiovascular:  Negative for chest pain, leg swelling and palpitations.  Gastrointestinal:  Negative for abdominal pain, constipation, diarrhea,  nausea and vomiting.  Genitourinary:  Negative for bladder incontinence, difficulty urinating, dysuria, frequency, hematuria and nocturia.   Musculoskeletal:  Positive for arthralgias (in left hip, 9/10 severity). Negative for back pain, flank pain, myalgias and neck pain.  Skin:  Negative for itching and rash.  Neurological:  Positive for numbness (in feet). Negative for dizziness and headaches.  Hematological:  Does not bruise/bleed easily.  Psychiatric/Behavioral:  Negative for depression, sleep disturbance and suicidal ideas. The patient is not nervous/anxious.   All other systems reviewed and are negative.    VITALS:   Weight 294 lb 1.5 oz (133.4 kg).  Wt Readings from Last 3 Encounters:  10/11/23 294 lb 1.5 oz (133.4 kg)  10/09/23 292 lb 6.4 oz (132.6 kg)  09/25/23 296 lb (134.3 kg)    Body mass index is 39.89 kg/m.  Performance status (ECOG): 1 - Symptomatic but completely ambulatory  PHYSICAL EXAM:   Physical Exam Vitals and nursing note reviewed. Exam conducted with a chaperone present.  Constitutional:      Appearance: Normal appearance.  Cardiovascular:     Rate and Rhythm: Normal rate and regular rhythm.     Pulses: Normal pulses.     Heart sounds: Normal heart sounds.  Pulmonary:     Effort: Pulmonary effort is normal.     Breath sounds: Normal breath sounds.  Abdominal:     Palpations: Abdomen is soft. There is no hepatomegaly, splenomegaly or mass.     Tenderness: There is no abdominal tenderness.  Musculoskeletal:     Right lower leg: No edema.     Left lower leg: No edema.  Lymphadenopathy:     Cervical: No cervical adenopathy.     Right cervical: No superficial, deep or posterior cervical adenopathy.    Left cervical: No superficial, deep or posterior cervical adenopathy.     Upper Body:     Right upper body: No supraclavicular or axillary adenopathy.     Left upper body: No supraclavicular or axillary adenopathy.  Neurological:     General: No  focal deficit present.     Mental Status: He is alert and oriented to person, place, and time.  Psychiatric:        Mood and Affect: Mood normal.        Behavior: Behavior normal.     LABS:      Latest Ref Rng & Units 10/11/2023   12:35 PM 09/13/2023   11:52 AM 08/13/2023   12:24 PM  CBC  WBC 4.0 - 10.5 K/uL 9.8  6.2  8.8   Hemoglobin 13.0 - 17.0 g/dL 21.3  08.6  57.8   Hematocrit 39.0 - 52.0 % 42.1  38.2  38.2   Platelets 150 - 400 K/uL 288  290  302       Latest Ref Rng & Units 10/11/2023   12:35 PM 09/13/2023   11:52 AM 08/13/2023   12:24 PM  CMP  Glucose 70 - 99 mg/dL 469  629  528   BUN 8 - 23 mg/dL 32  25  53   Creatinine 0.61 - 1.24 mg/dL 4.13  1.01  1.29   Sodium 135 - 145 mmol/L 134  134  136   Potassium 3.5 - 5.1 mmol/L 4.2  4.0  3.7   Chloride 98 - 111 mmol/L 101  102  100   CO2 22 - 32 mmol/L 24  24  26    Calcium 8.9 - 10.3 mg/dL 9.3  9.0  9.7   Total Protein 6.5 - 8.1 g/dL 6.7  6.8  6.8   Total Bilirubin 0.0 - 1.2 mg/dL 0.3  0.3  0.4   Alkaline Phos 38 - 126 U/L 78  65  66   AST 15 - 41 U/L 19  23  21    ALT 0 - 44 U/L 22  22  26       Lab Results  Component Value Date   CEA1 2.8 10/31/2022   /  CEA  Date Value Ref Range Status  10/31/2022 2.8 0.0 - 4.7 ng/mL Final    Comment:    (NOTE)                             Nonsmokers          <3.9                             Smokers             <5.6 [employer] Electrochemiluminescence Immunoassay (ECLIA) Values obtained with different assay methods or kits cannot be used interchangeably.  Results cannot be interpreted as absolute evidence of the presence or absence of malignant disease. Performed At: [facility] [address] [place], [place] [id number] Jolene Schimke MD UX:[id number]    No results found for: "PSA1" Lab Results  Component Value Date   ZDG644 11 10/31/2022   No results found for: "CAN125"  No results found for: "TOTALPROTELP", "ALBUMINELP", "A1GS", "A2GS", "BETS",  "BETA2SER", "GAMS", "MSPIKE", "SPEI" Lab Results  Component Value Date   TIBC 428 01/29/2023   TIBC 457 (H) 01/04/2022   FERRITIN 183 01/29/2023   FERRITIN 522 (H) 01/04/2022   IRONPCTSAT 15 01/29/2023   IRONPCTSAT 16 (L) 01/04/2022   Lab Results  Component Value Date   LDH 266 (H) 10/05/2021   LDH 242 (H) 09/01/2021     STUDIES:   CT CHEST ABDOMEN PELVIS W CONTRAST Result Date: 10/03/2023 CLINICAL DATA:  Metastatic cholangiocarcinoma. * Tracking Code: BO * EXAM: CT CHEST, ABDOMEN, AND PELVIS WITH CONTRAST TECHNIQUE: Multidetector CT imaging of the chest, abdomen and pelvis was performed following the standard protocol during bolus administration of intravenous contrast. RADIATION DOSE REDUCTION: This exam was performed according to the departmental dose-optimization program which includes automated exposure control, adjustment of the mA and/or kV according to patient size and/or use of iterative reconstruction technique. CONTRAST:  OMNIPAQUE IOHEXOL 300 MG/ML  SOLN COMPARISON:  CT angiogram chest and abdomen pelvis CT 06/01/2023. Older exams as well FINDINGS: CT CHEST FINDINGS Cardiovascular: Right IJ chest port. Tip along the central SVC. Mild vascular calcifications along the aorta. Normal variant of the left vertebral artery originating directly from the aortic arch proximal to the left subclavian. The heart is nonenlarged. No pericardial effusion. Coronary artery calcifications are seen. Mediastinum/Nodes: No enlarged mediastinal, hilar, or axillary lymph nodes. Thyroid gland, trachea, and esophagus demonstrate no significant findings. Lungs/Pleura: No consolidation, pneumothorax or effusion. There are some peripheral interstitial areas of thickening bilaterally, likely scarring or fibrotic changes. No  dominant lung nodule identified. Musculoskeletal: Scattered degenerative changes along the spine. CT ABDOMEN PELVIS FINDINGS Hepatobiliary: Diffuse fatty liver infiltration. Slight  nodular contours of the liver. Stones in the gallbladder. The gallbladder wall is slightly thickened and there is some slight adjacent stranding but unchanged from previous. Please correlate with history. Gallbladder is nondilated. Patent portal vein. Pancreas: Moderate fatty atrophy of the pancreas. Spleen: Normal in size without focal abnormality. Adrenals/Urinary Tract: Adrenal glands are unremarkable. Kidneys are normal, without renal calculi, focal lesion, or hydronephrosis. Bladder is unremarkable. Stomach/Bowel: Stomach and small bowel are nondilated. Large bowel has a normal course and caliber with scattered colonic stool. Scattered colonic diverticula. There are some areas of mild wall thickening along the proximal sigmoid colon but this loop is decompressed in this could relate to peristalsis. There is a normal appendix in the right lower quadrant. Vascular/Lymphatic: Normal caliber aorta and IVC. Scattered vascular calcifications along the aorta and branch vessels. Few small less than 1 cm size nodes are seen in the retroperitoneum, nonpathologic by size criteria. Similar areas in the pelvis. Overall these are similar. Reproductive: Prostate is unremarkable. Other: There is a increasing mesenteric stranding. Increasing peritoneal nodules identified such as along the anterior left hemiabdomen, series 2, image 74. Thickening along the lateral conal fascia on the left with a nodular focus on series 2, image 78 measuring 13 mm. Additional areas anterior to the spleen, series 2, image 61. Focus in this location on series 2, image 54 today measures 13 mm. Areas right pericolic gutter. Musculoskeletal: Degenerative changes seen of the spine and pelvis. Streak artifact related to patient's right hip arthroplasty. Sclerotic bone lesion along the left iliac bone could be a bone island. IMPRESSION: Increasing, developing areas of subtle peritoneal nodularity worrisome for spread of disease. No significant ascites.  Scattered mesenteric stranding. Persistent wall thickening and stones in the gallbladder with slight stranding. Fatty liver infiltration. Colonic diverticula.  No obstruction. Electronically Signed   By: Karen Kays M.D.   On: 10/03/2023 16:17   DG HIP UNILAT WITH PELVIS 2-3 VIEWS LEFT Result Date: 09/19/2023 CLINICAL DATA:  68 year old male with left hip pain for 1.5 weeks. No known injury. EXAM: DG HIP (WITH OR WITHOUT PELVIS) 2-3V LEFT COMPARISON:  CT Abdomen and Pelvis 06/01/2023. FINDINGS: Three supine views. Chronic right total hip arthroplasty redemonstrated. Visible hardware appears intact in the AP plane. Pelvis appears stable and intact. SI joints appear symmetric and normal. Negative lower abdominal and pelvic visceral contours. Incidental pelvic phleboliths. Superior left hip joint space loss which appears progressed since last year. Mild subchondral sclerosis. Left femoral head and proximal left femur appear intact. IMPRESSION: 1. Left hip osteoarthritis which seems regressed from CT Abdomen and Pelvis last year. 2. No acute osseous abnormality identified. Chronic right total hip arthroplasty. Electronically Signed   By: Odessa Fleming M.D.   On: 09/19/2023 09:42

## 2023-10-11 NOTE — Progress Notes (Signed)
Patient has been examined by Dr. Ellin Saba. Vital signs and labs have been reviewed by MD - ANC, Creatinine, LFTs, hemoglobin, and platelets are within treatment parameters per M.D. - pt may proceed with treatment.  Primary RN and pharmacy notified.

## 2023-10-11 NOTE — Progress Notes (Signed)
Proceeding with treatment today per Dr Ellin Saba  V.O. Dr Legrand Pitts Charlestine Massed

## 2023-10-11 NOTE — Patient Instructions (Signed)
[facility] [place] - A DEPT OF [facility]  Discharge Instructions: Thank you for choosing [facility] to provide your oncology and hematology care.  If you have a lab appointment with the Cancer Center - please note that after April 8th, 2024, all labs will be drawn in the cancer center.  You do not have to check in or register with the main entrance as you have in the past but will complete your check-in in the cancer center.  Wear comfortable clothing and clothing appropriate for easy access to any Portacath or PICC line.   We strive to give you quality time with your provider. You may need to reschedule your appointment if you arrive late (15 or more minutes).  Arriving late affects you and other patients whose appointments are after yours.  Also, if you miss three or more appointments without notifying the office, you may be dismissed from the clinic at the provider's discretion.      For prescription refill requests, have your pharmacy contact our office and allow 72 hours for refills to be completed.    Today you received the following chemotherapy and/or immunotherapy agents Imfinzi.  Durvalumab Injection What is this medication? DURVALUMAB (dur VAL ue mab) treats some types of cancer. It works by helping your immune system slow or stop the spread of cancer cells. It is a monoclonal antibody. This medicine may be used for other purposes; ask your health care provider or pharmacist if you have questions. COMMON BRAND NAME(S): IMFINZI What should I tell my care team before I take this medication? They need to know if you have any of these conditions: Allogeneic stem cell transplant (uses someone else's stem cells) Autoimmune diseases, such as Crohn disease, ulcerative colitis, lupus History of chest radiation Nervous system problems, such as Guillain-Barre syndrome, myasthenia gravis Organ transplant An unusual or allergic reaction to durvalumab,  other medications, foods, dyes, or preservatives Pregnant or trying to get pregnant Breast-feeding How should I use this medication? This medication is infused into a vein. It is given by your care team in a hospital or clinic setting. A special MedGuide will be given to you before each treatment. Be sure to read this information carefully each time. Talk to your care team about the use of this medication in children. Special care may be needed. Overdosage: If you think you have taken too much of this medicine contact a poison control center or emergency room at once. NOTE: This medicine is only for you. Do not share this medicine with others. What if I miss a dose? Keep appointments for follow-up doses. It is important not to miss your dose. Call your care team if you are unable to keep an appointment. What may interact with this medication? Interactions have not been studied. This list may not describe all possible interactions. Give your health care provider a list of all the medicines, herbs, non-prescription drugs, or dietary supplements you use. Also tell them if you smoke, drink alcohol, or use illegal drugs. Some items may interact with your medicine. What should I watch for while using this medication? Your condition will be monitored carefully while you are receiving this medication. You may need blood work while taking this medication. This medication may cause serious skin reactions. They can happen weeks to months after starting the medication. Contact your care team right away if you notice fevers or flu-like symptoms with a rash. The rash may be red or  purple and then turn into blisters or peeling of the skin. You may also notice a red rash with swelling of the face, lips, or lymph nodes in your neck or under your arms. Tell your care team right away if you have any change in your eyesight. Talk to your care team if you may be pregnant. Serious birth defects can occur if you take  this medication during pregnancy and for 3 months after the last dose. You will need a negative pregnancy test before starting this medication. Contraception is recommended while taking this medication and for 3 months after the last dose. Your care team can help you find the option that works for you. Do not breastfeed while taking this medication and for 3 months after the last dose. What side effects may I notice from receiving this medication? Side effects that you should report to your care team as soon as possible: Allergic reactions--skin rash, itching, hives, swelling of the face, lips, tongue, or throat Dry cough, shortness of breath or trouble breathing Eye pain, redness, irritation, or discharge with blurry or decreased vision Heart muscle inflammation--unusual weakness or fatigue, shortness of breath, chest pain, fast or irregular heartbeat, dizziness, swelling of the ankles, feet, or hands Hormone gland problems--headache, sensitivity to light, unusual weakness or fatigue, dizziness, fast or irregular heartbeat, increased sensitivity to cold or heat, excessive sweating, constipation, hair loss, increased thirst or amount of urine, tremors or shaking, irritability Infusion reactions--chest pain, shortness of breath or trouble breathing, feeling faint or lightheaded Kidney injury (glomerulonephritis)--decrease in the amount of urine, red or dark brown urine, foamy or bubbly urine, swelling of the ankles, hands, or feet Liver injury--right upper belly pain, loss of appetite, nausea, light-colored stool, dark yellow or brown urine, yellowing skin or eyes, unusual weakness or fatigue Pain, tingling, or numbness in the hands or feet, muscle weakness, change in vision, confusion or trouble speaking, loss of balance or coordination, trouble walking, seizures Rash, fever, and swollen lymph nodes Redness, blistering, peeling, or loosening of the skin, including inside the mouth Sudden or severe  stomach pain, bloody diarrhea, fever, nausea, vomiting Side effects that usually do not require medical attention (report these to your care team if they continue or are bothersome): Bone, joint, or muscle pain Diarrhea Fatigue Loss of appetite Nausea Skin rash This list may not describe all possible side effects. Call your doctor for medical advice about side effects. You may report side effects to FDA at 1-800-FDA-1088. Where should I keep my medication? This medication is given in a hospital or clinic. It will not be stored at home. NOTE: This sheet is a summary. It may not cover all possible information. If you have questions about this medicine, talk to your doctor, pharmacist, or health care provider.  2024 Elsevier/Gold Standard (2022-01-10 00:00:00)        To help prevent nausea and vomiting after your treatment, we encourage you to take your nausea medication as directed.  BELOW ARE SYMPTOMS THAT SHOULD BE REPORTED IMMEDIATELY: *FEVER GREATER THAN 100.4 F (38 C) OR HIGHER *CHILLS OR SWEATING *NAUSEA AND VOMITING THAT IS NOT CONTROLLED WITH YOUR NAUSEA MEDICATION *UNUSUAL SHORTNESS OF BREATH *UNUSUAL BRUISING OR BLEEDING *URINARY PROBLEMS (pain or burning when urinating, or frequent urination) *BOWEL PROBLEMS (unusual diarrhea, constipation, pain near the anus) TENDERNESS IN MOUTH AND THROAT WITH OR WITHOUT PRESENCE OF ULCERS (sore throat, sores in mouth, or a toothache) UNUSUAL RASH, SWELLING OR PAIN  UNUSUAL VAGINAL DISCHARGE OR ITCHING   Items  with * indicate a potential emergency and should be followed up as soon as possible or go to the Emergency Department if any problems should occur.  Please show the CHEMOTHERAPY ALERT CARD or IMMUNOTHERAPY ALERT CARD at check-in to the Emergency Department and triage nurse.  Should you have questions after your visit or need to cancel or reschedule your appointment, please contact [facility] [place] - A DEPT OF Eligha Brideg[facility] [phone]  and follow the prompts.  Office hours are 8:00 a.m. to 4:30 p.m. Monday - Friday. Please note that voicemails left after 4:00 p.m. may not be returned until the following business day.  We are closed weekends and major holidays. You have access to a nurse at all times for urgent questions. Please call the main number to the clinic [phone] and follow the prompts.  For any non-urgent questions, you may also contact your provider using MyChart. We now offer e-Visits for anyone 68 and older to request care online for non-urgent symptoms. For details visit [email or web address].   Also download the MyChart app! Go to the app store, search "MyChart", open the app, select [place], and log in with your MyChart username and password.

## 2023-10-12 LAB — CANCER ANTIGEN 19-9: CA 19-9: 20 U/mL (ref 0–35)

## 2023-10-12 LAB — T4: T4, Total: 7.1 ug/dL (ref 4.5–12.0)

## 2023-10-12 LAB — CEA: CEA: 3.4 ng/mL (ref 0.0–4.7)

## 2023-10-15 ENCOUNTER — Other Ambulatory Visit: Payer: Self-pay | Admitting: *Deleted

## 2023-10-15 ENCOUNTER — Telehealth: Payer: Self-pay | Admitting: Pharmacy Technician

## 2023-10-15 DIAGNOSIS — Z5986 Financial insecurity: Secondary | ICD-10-CM

## 2023-10-15 DIAGNOSIS — C787 Secondary malignant neoplasm of liver and intrahepatic bile duct: Secondary | ICD-10-CM

## 2023-10-15 NOTE — Progress Notes (Signed)
Pharmacy Medication Assistance Program Note    10/15/2023  Patient ID: Yonah Tangeman, male   DOB: 12-05-1955, 68 y.o.   MRN: [id number]     10/11/2023 10/15/2023  Outreach Medication One  Initial Outreach Date (Medication One) 07/16/2023   Manufacturer Medication One [employer] [facility] Rybelsus   Dose of Rybelsus 14mg    Type of Radiographer, therapeutic Assistance   Date Application Sent to Patient 07/18/2023   Application Items Requested Application;Proof of Income;Other   Date Application Sent to Prescriber 07/18/2023   Name of Prescriber Nita Sells   Date Application Received From Patient 10/11/2023   Application Items Received From Patient Application;Proof of Income   Date Application Received From Provider 08/06/2023   Date Application Submitted to Manufacturer 10/11/2023   Method Application Sent to Manufacturer Fax   Patient Assistance Determination  Approved  Approval Start Date  10/12/2023  Approval End Date  09/10/2024  Patient Notification Method  Telephone Call  Telephone Call Outcome  Successful     Care coordination call placed to [employer] IVR sytem in regard to Rybelsus application.  Per IVR system patient is APPROVED 10/12/23-09/10/24. Medication and subsequent refills will process automatically and be delivered to the prescriber's office. Patient may call [employer] at any time to check on next shipment by calling [phone].  Successful outreach to patient. HIPAA verified. Patient was informed of his approval, when to expect his medication and refill procedure. Patient verbalized understanding.  Pattricia Boss, CPhT [place]  Office: [phone] Fax: [phone] Email: [email or web address]

## 2023-10-30 ENCOUNTER — Encounter: Payer: Self-pay | Admitting: Hematology

## 2023-11-07 ENCOUNTER — Inpatient Hospital Stay: Payer: Medicare Other | Attending: Hematology

## 2023-11-07 ENCOUNTER — Encounter: Payer: Self-pay | Admitting: Hematology

## 2023-11-07 ENCOUNTER — Inpatient Hospital Stay: Payer: Medicare Other

## 2023-11-07 VITALS — BP 127/80 | HR 80 | Temp 98.1°F | Resp 18

## 2023-11-07 DIAGNOSIS — R0602 Shortness of breath: Secondary | ICD-10-CM | POA: Diagnosis not present

## 2023-11-07 DIAGNOSIS — E876 Hypokalemia: Secondary | ICD-10-CM | POA: Diagnosis not present

## 2023-11-07 DIAGNOSIS — Z95828 Presence of other vascular implants and grafts: Secondary | ICD-10-CM

## 2023-11-07 DIAGNOSIS — Z8551 Personal history of malignant neoplasm of bladder: Secondary | ICD-10-CM | POA: Diagnosis not present

## 2023-11-07 DIAGNOSIS — Z87891 Personal history of nicotine dependence: Secondary | ICD-10-CM | POA: Diagnosis not present

## 2023-11-07 DIAGNOSIS — Z79620 Long term (current) use of immunosuppressive biologic: Secondary | ICD-10-CM | POA: Diagnosis not present

## 2023-11-07 DIAGNOSIS — R970 Elevated carcinoembryonic antigen [CEA]: Secondary | ICD-10-CM | POA: Diagnosis not present

## 2023-11-07 DIAGNOSIS — G479 Sleep disorder, unspecified: Secondary | ICD-10-CM | POA: Diagnosis not present

## 2023-11-07 DIAGNOSIS — M545 Low back pain, unspecified: Secondary | ICD-10-CM | POA: Insufficient documentation

## 2023-11-07 DIAGNOSIS — C221 Intrahepatic bile duct carcinoma: Secondary | ICD-10-CM

## 2023-11-07 DIAGNOSIS — Z5112 Encounter for antineoplastic immunotherapy: Secondary | ICD-10-CM | POA: Diagnosis present

## 2023-11-07 DIAGNOSIS — G629 Polyneuropathy, unspecified: Secondary | ICD-10-CM | POA: Insufficient documentation

## 2023-11-07 DIAGNOSIS — Z79899 Other long term (current) drug therapy: Secondary | ICD-10-CM | POA: Insufficient documentation

## 2023-11-07 DIAGNOSIS — M25473 Effusion, unspecified ankle: Secondary | ICD-10-CM | POA: Diagnosis not present

## 2023-11-07 LAB — COMPREHENSIVE METABOLIC PANEL
ALT: 23 U/L (ref 0–44)
AST: 21 U/L (ref 15–41)
Albumin: 3.4 g/dL — ABNORMAL LOW (ref 3.5–5.0)
Alkaline Phosphatase: 77 U/L (ref 38–126)
Anion gap: 13 (ref 5–15)
BUN: 16 mg/dL (ref 8–23)
CO2: 24 mmol/L (ref 22–32)
Calcium: 9.3 mg/dL (ref 8.9–10.3)
Chloride: 101 mmol/L (ref 98–111)
Creatinine, Ser: 1 mg/dL (ref 0.61–1.24)
GFR, Estimated: >60 mL/min (ref >60)
Glucose, Bld: 173 mg/dL — ABNORMAL HIGH (ref 70–99)
Potassium: 3.8 mmol/L (ref 3.5–5.1)
Sodium: 138 mmol/L (ref 135–145)
Total Bilirubin: 0.4 mg/dL (ref 0.0–1.2)
Total Protein: 6.3 g/dL — ABNORMAL LOW (ref 6.5–8.1)

## 2023-11-07 LAB — CBC WITH DIFFERENTIAL/PLATELET
Abs Immature Granulocytes: 0.05 10*3/uL (ref 0.00–0.07)
Basophils Absolute: 0.1 10*3/uL (ref 0.0–0.1)
Basophils Relative: 1 %
Eosinophils Absolute: 0.1 10*3/uL (ref 0.0–0.5)
Eosinophils Relative: 3 %
HCT: 40 % (ref 39.0–52.0)
Hemoglobin: 13.3 g/dL (ref 13.0–17.0)
Immature Granulocytes: 1 %
Lymphocytes Relative: 25 %
Lymphs Abs: 1.3 10*3/uL (ref 0.7–4.0)
MCH: 29.2 pg (ref 26.0–34.0)
MCHC: 33.3 g/dL (ref 30.0–36.0)
MCV: 87.7 fL (ref 80.0–100.0)
Monocytes Absolute: 0.5 10*3/uL (ref 0.1–1.0)
Monocytes Relative: 10 %
Neutro Abs: 3.2 10*3/uL (ref 1.7–7.7)
Neutrophils Relative %: 60 %
Platelets: 279 10*3/uL (ref 150–400)
RBC: 4.56 MIL/uL (ref 4.22–5.81)
RDW: 14.4 % (ref 11.5–15.5)
WBC: 5.2 10*3/uL (ref 4.0–10.5)
nRBC: 0 % (ref 0.0–0.2)

## 2023-11-07 LAB — MAGNESIUM: Magnesium: 1.7 mg/dL (ref 1.7–2.4)

## 2023-11-07 LAB — TSH: TSH: 2.61 u[IU]/mL (ref 0.350–4.500)

## 2023-11-07 MED ORDER — DURVALUMAB 500 MG/10ML IV SOLN
1500.0000 mg | Freq: Once | INTRAVENOUS | Status: AC
  Administered 2023-11-07: 1500 mg via INTRAVENOUS
  Filled 2023-11-07: qty 30

## 2023-11-07 MED ORDER — HEPARIN SOD (PORK) LOCK FLUSH 100 UNIT/ML IV SOLN
500.0000 [IU] | Freq: Once | INTRAVENOUS | Status: AC | PRN
  Administered 2023-11-07: 500 [IU]

## 2023-11-07 MED ORDER — SODIUM CHLORIDE FLUSH 0.9 % IV SOLN
10.0000 mL | Freq: Once | INTRAVENOUS | Status: AC
  Administered 2023-11-07: 10 mL via INTRAVENOUS
  Filled 2023-11-07: qty 10

## 2023-11-07 MED ORDER — SODIUM CHLORIDE 0.9% FLUSH
10.0000 mL | INTRAVENOUS | Status: DC | PRN
  Administered 2023-11-07: 10 mL

## 2023-11-07 MED ORDER — SODIUM CHLORIDE 0.9 % IV SOLN: Freq: Once | INTRAVENOUS | Status: AC

## 2023-11-07 NOTE — Patient Instructions (Signed)
 [facility] [place] [facility]  Discharge Instructions: Thank you for choosing [facility] to provide your oncology and hematology care.  If you have a lab appointment with the Cancer Center - please note that after April 8th, 2024, all labs will be drawn in the cancer center.  You do not have to check in or register with the main entrance as you have in the past but will complete your check-in in the cancer center.  Wear comfortable clothing and clothing appropriate for easy access to any Portacath or PICC line.   We strive to give you quality time with your provider. You may need to reschedule your appointment if you arrive late (15 or more minutes).  Arriving late affects you and other patients whose appointments are after yours.  Also, if you miss three or more appointments without notifying the office, you may be dismissed from the clinic at the provider's discretion.      For prescription refill requests, have your pharmacy contact our office and allow 72 hours for refills to be completed.    Today you received the following chemotherapy and/or immunotherapy agents Imfinzi      To help prevent nausea and vomiting after your treatment, we encourage you to take your nausea medication as directed.  BELOW ARE SYMPTOMS THAT SHOULD BE REPORTED IMMEDIATELY: *FEVER GREATER THAN 100.4 F (38 C) OR HIGHER *CHILLS OR SWEATING *NAUSEA AND VOMITING THAT IS NOT CONTROLLED WITH YOUR NAUSEA MEDICATION *UNUSUAL SHORTNESS OF BREATH *UNUSUAL BRUISING OR BLEEDING *URINARY PROBLEMS (pain or burning when urinating, or frequent urination) *BOWEL PROBLEMS (unusual diarrhea, constipation, pain near the anus) TENDERNESS IN MOUTH AND THROAT WITH OR WITHOUT PRESENCE OF ULCERS (sore throat, sores in mouth, or a toothache) UNUSUAL RASH, SWELLING OR PAIN  UNUSUAL VAGINAL DISCHARGE OR ITCHING   Items with * indicate a potential emergency and should be followed up  as soon as possible or go to the Emergency Department if any problems should occur.  Please show the CHEMOTHERAPY ALERT CARD or IMMUNOTHERAPY ALERT CARD at check-in to the Emergency Department and triage nurse.  Should you have questions after your visit or need to cancel or reschedule your appointment, please contact [facility] [place] - A DEPT OF [facility] [phone]  and follow the prompts.  Office hours are 8:00 a.m. to 4:30 p.m. Monday - Friday. Please note that voicemails left after 4:00 p.m. may not be returned until the following business day.  We are closed weekends and major holidays. You have access to a nurse at all times for urgent questions. Please call the main number to the clinic [phone] and follow the prompts.  For any non-urgent questions, you may also contact your provider using MyChart. We now offer e-Visits for anyone 42 and older to request care online for non-urgent symptoms. For details visit [email or web address].   Also download the MyChart app! Go to the app store, search "MyChart", open the app, select [place], and log in with your MyChart username and password.

## 2023-11-07 NOTE — Progress Notes (Signed)
 Patient presents today for Imfinzi infusion per providers order.  Vital signs and labs within parameters for treatment.  Patient has no new complaints at this time.  Treatment given today per MD orders.  Stable during infusion without adverse affects.  Vital signs stable.  No complaints at this time.  Discharge from clinic ambulatory in stable condition.  Alert and oriented X 3.  Follow up with [facility] as scheduled.

## 2023-11-08 ENCOUNTER — Inpatient Hospital Stay: Payer: Medicare Other

## 2023-11-08 DIAGNOSIS — C221 Intrahepatic bile duct carcinoma: Secondary | ICD-10-CM

## 2023-11-08 DIAGNOSIS — Z95828 Presence of other vascular implants and grafts: Secondary | ICD-10-CM

## 2023-11-08 LAB — T4: T4, Total: 6.8 ug/dL (ref 4.5–12.0)

## 2023-11-12 NOTE — Telephone Encounter (Signed)
**Note De-identified  [place] Obfuscation** Please advise 

## 2023-11-13 ENCOUNTER — Telehealth: Payer: Self-pay | Admitting: *Deleted

## 2023-11-13 NOTE — Telephone Encounter (Signed)
   Name: Marvin Carr  DOB: 23-Mar-1956  MRN: [id number]  Primary Cardiologist: Nona Dell, MD   Preoperative team, please contact this patient and set up a phone call appointment on or after 11/16/2023 for further preoperative risk assessment. Please obtain consent and complete medication review. Thank you for your help.  I confirm that guidance regarding antiplatelet and oral anticoagulation therapy has been completed and, if necessary, noted below.  Patient with history of nonobstructive CAD.  Per office protocol, if patient is without any new symptoms or concerns at the time of their virtual visit, he/she may hold Aspirin for 5-7 days prior to procedure. Please resume Aspirin as soon as possible postprocedure, at the discretion of the surgeon.   I also confirmed the patient resides in the state of [place]. As per [facility] Medical Board telemedicine laws, the patient must reside in the state in which the provider is licensed.   Joylene Grapes, NP 11/13/2023, 9:56 AM [facility]

## 2023-11-13 NOTE — Telephone Encounter (Signed)
 Pt has been scheduled tele preop appt 12/28/23. Med rec and consent are done.

## 2023-11-13 NOTE — Telephone Encounter (Signed)
   Pre-operative Risk Assessment    Patient Name: Marvin Carr  DOB: May 06, 1956 MRN: [id number]   Date of last office visit: 10/09/23 [facility] Date of next office visit: NONE   Request for Surgical Clearance    Procedure:   LEFT TOTAL HIP ARTHROPLASTY  Date of Surgery:  Clearance 01/15/24                                Surgeon:  DR. MATTHEW OLIN Surgeon's Group or Practice Name:  [facility] Phone number:  [phone] Rosalva Ferron Fax number:  [phone]   Type of Clearance Requested:   - Medical  - Pharmacy:  Hold Aspirin     Type of Anesthesia:  Spinal   Additional requests/questions:    [name]   11/13/2023, 9:46 AM

## 2023-11-13 NOTE — Telephone Encounter (Signed)
 Pt has been scheduled tele preop appt 12/28/23. Med rec and consent are done.     Patient Consent for Virtual Visit        Marvin Carr has provided verbal consent on 11/13/2023 for a virtual visit (video or telephone).   CONSENT FOR VIRTUAL VISIT FOR:  Marvin Carr  By participating in this virtual visit I agree to the following:  I hereby voluntarily request, consent and authorize [facility] and its employed or contracted physicians, physician assistants, nurse practitioners or other licensed health care professionals (the Practitioner), to provide me with telemedicine health care services (the "Services") as deemed necessary by the treating Practitioner. I acknowledge and consent to receive the Services by the Practitioner via telemedicine. I understand that the telemedicine visit will involve communicating with the Practitioner through live audiovisual communication technology and the disclosure of certain medical information by electronic transmission. I acknowledge that I have been given the opportunity to request an in-person assessment or other available alternative prior to the telemedicine visit and am voluntarily participating in the telemedicine visit.  I understand that I have the right to withhold or withdraw my consent to the use of telemedicine in the course of my care at any time, without affecting my right to future care or treatment, and that the Practitioner or I may terminate the telemedicine visit at any time. I understand that I have the right to inspect all information obtained and/or recorded in the course of the telemedicine visit and may receive copies of available information for a reasonable fee.  I understand that some of the potential risks of receiving the Services via telemedicine include:  Delay or interruption in medical evaluation due to technological equipment failure or disruption; Information transmitted may not be sufficient (e.g. poor  resolution of images) to allow for appropriate medical decision making by the Practitioner; and/or  In rare instances, security protocols could fail, causing a breach of personal health information.  Furthermore, I acknowledge that it is my responsibility to provide information about my medical history, conditions and care that is complete and accurate to the best of my ability. I acknowledge that Practitioner's advice, recommendations, and/or decision may be based on factors not within their control, such as incomplete or inaccurate data provided by me or distortions of diagnostic images or specimens that may result from electronic transmissions. I understand that the practice of medicine is not an exact science and that Practitioner makes no warranties or guarantees regarding treatment outcomes. I acknowledge that a copy of this consent can be made available to me via my patient portal [facility] MyChart), or I can request a printed copy by calling the office of [facility].    I understand that my insurance will be billed for this visit.   I have read or had this consent read to me. I understand the contents of this consent, which adequately explains the benefits and risks of the Services being provided via telemedicine.  I have been provided ample opportunity to ask questions regarding this consent and the Services and have had my questions answered to my satisfaction. I give my informed consent for the services to be provided through the use of telemedicine in my medical care

## 2023-11-29 ENCOUNTER — Ambulatory Visit (HOSPITAL_COMMUNITY)
Admission: RE | Admit: 2023-11-29 | Discharge: 2023-11-29 | Disposition: A | Discharge status: Home / Self Care | Payer: Medicare Other | Source: Ambulatory Visit | Attending: Hematology | Admitting: Hematology

## 2023-11-29 DIAGNOSIS — C787 Secondary malignant neoplasm of liver and intrahepatic bile duct: Secondary | ICD-10-CM | POA: Insufficient documentation

## 2023-11-29 DIAGNOSIS — C221 Intrahepatic bile duct carcinoma: Secondary | ICD-10-CM | POA: Diagnosis present

## 2023-11-29 MED ORDER — IOHEXOL 9 MG/ML PO SOLN
500.0000 mL | ORAL | Status: AC
  Administered 2023-11-29: 500 mL via ORAL

## 2023-11-29 MED ORDER — IOHEXOL 300 MG/ML  SOLN
100.0000 mL | Freq: Once | INTRAMUSCULAR | Status: AC | PRN
  Administered 2023-11-29: 100 mL via INTRAVENOUS

## 2023-12-06 ENCOUNTER — Inpatient Hospital Stay: Payer: Medicare Other

## 2023-12-06 ENCOUNTER — Inpatient Hospital Stay: Payer: Medicare Other | Attending: Hematology | Admitting: Hematology

## 2023-12-06 VITALS — BP 133/80

## 2023-12-06 VITALS — BP 147/91 | HR 86 | Temp 99.0°F | Resp 20 | Wt 289.9 lb

## 2023-12-06 DIAGNOSIS — Z79899 Other long term (current) drug therapy: Secondary | ICD-10-CM | POA: Diagnosis not present

## 2023-12-06 DIAGNOSIS — Z8551 Personal history of malignant neoplasm of bladder: Secondary | ICD-10-CM | POA: Insufficient documentation

## 2023-12-06 DIAGNOSIS — Z95828 Presence of other vascular implants and grafts: Secondary | ICD-10-CM

## 2023-12-06 DIAGNOSIS — G479 Sleep disorder, unspecified: Secondary | ICD-10-CM | POA: Insufficient documentation

## 2023-12-06 DIAGNOSIS — Z87891 Personal history of nicotine dependence: Secondary | ICD-10-CM | POA: Diagnosis not present

## 2023-12-06 DIAGNOSIS — C221 Intrahepatic bile duct carcinoma: Secondary | ICD-10-CM | POA: Diagnosis not present

## 2023-12-06 DIAGNOSIS — M1612 Unilateral primary osteoarthritis, left hip: Secondary | ICD-10-CM | POA: Insufficient documentation

## 2023-12-06 DIAGNOSIS — C786 Secondary malignant neoplasm of retroperitoneum and peritoneum: Secondary | ICD-10-CM | POA: Diagnosis not present

## 2023-12-06 DIAGNOSIS — Z5111 Encounter for antineoplastic chemotherapy: Secondary | ICD-10-CM | POA: Insufficient documentation

## 2023-12-06 DIAGNOSIS — R970 Elevated carcinoembryonic antigen [CEA]: Secondary | ICD-10-CM | POA: Insufficient documentation

## 2023-12-06 DIAGNOSIS — Z5112 Encounter for antineoplastic immunotherapy: Secondary | ICD-10-CM | POA: Diagnosis present

## 2023-12-06 DIAGNOSIS — M51372 Other intervertebral disc degeneration, lumbosacral region with discogenic back pain and lower extremity pain: Secondary | ICD-10-CM | POA: Insufficient documentation

## 2023-12-06 DIAGNOSIS — C9000 Multiple myeloma not having achieved remission: Secondary | ICD-10-CM | POA: Diagnosis not present

## 2023-12-06 DIAGNOSIS — G629 Polyneuropathy, unspecified: Secondary | ICD-10-CM | POA: Diagnosis not present

## 2023-12-06 LAB — CBC WITH DIFFERENTIAL/PLATELET
Abs Immature Granulocytes: 0.08 10*3/uL — ABNORMAL HIGH (ref 0.00–0.07)
Basophils Absolute: 0 10*3/uL (ref 0.0–0.1)
Basophils Relative: 1 %
Eosinophils Absolute: 0 10*3/uL (ref 0.0–0.5)
Eosinophils Relative: 0 %
HCT: 43.4 % (ref 39.0–52.0)
Hemoglobin: 14.4 g/dL (ref 13.0–17.0)
Immature Granulocytes: 1 %
Lymphocytes Relative: 9 %
Lymphs Abs: 0.8 10*3/uL (ref 0.7–4.0)
MCH: 29 pg (ref 26.0–34.0)
MCHC: 33.2 g/dL (ref 30.0–36.0)
MCV: 87.3 fL (ref 80.0–100.0)
Monocytes Absolute: 0.7 10*3/uL (ref 0.1–1.0)
Monocytes Relative: 8 %
Neutro Abs: 7.2 10*3/uL (ref 1.7–7.7)
Neutrophils Relative %: 81 %
Platelets: 347 10*3/uL (ref 150–400)
RBC: 4.97 MIL/uL (ref 4.22–5.81)
RDW: 14.8 % (ref 11.5–15.5)
WBC: 8.9 10*3/uL (ref 4.0–10.5)
nRBC: 0 % (ref 0.0–0.2)

## 2023-12-06 LAB — COMPREHENSIVE METABOLIC PANEL WITH GFR
ALT: 18 U/L (ref 0–44)
AST: 19 U/L (ref 15–41)
Albumin: 4.3 g/dL (ref 3.5–5.0)
Alkaline Phosphatase: 73 U/L (ref 38–126)
Anion gap: 13 (ref 5–15)
BUN: 32 mg/dL — ABNORMAL HIGH (ref 8–23)
CO2: 24 mmol/L (ref 22–32)
Calcium: 9.9 mg/dL (ref 8.9–10.3)
Chloride: 98 mmol/L (ref 98–111)
Creatinine, Ser: 1.07 mg/dL (ref 0.61–1.24)
GFR, Estimated: >60 mL/min (ref >60)
Glucose, Bld: 111 mg/dL — ABNORMAL HIGH (ref 70–99)
Potassium: 4 mmol/L (ref 3.5–5.1)
Sodium: 135 mmol/L (ref 135–145)
Total Bilirubin: 0.4 mg/dL (ref 0.0–1.2)
Total Protein: 7.6 g/dL (ref 6.5–8.1)

## 2023-12-06 LAB — MAGNESIUM: Magnesium: 2.1 mg/dL (ref 1.7–2.4)

## 2023-12-06 LAB — TSH: TSH: 2.639 u[IU]/mL (ref 0.350–4.500)

## 2023-12-06 MED ORDER — HEPARIN SOD (PORK) LOCK FLUSH 100 UNIT/ML IV SOLN
500.0000 [IU] | Freq: Once | INTRAVENOUS | Status: AC
  Administered 2023-12-06: 500 [IU] via INTRAVENOUS

## 2023-12-06 MED ORDER — SODIUM CHLORIDE 0.9% FLUSH
10.0000 mL | Freq: Once | INTRAVENOUS | Status: AC
  Administered 2023-12-06: 10 mL via INTRAVENOUS

## 2023-12-06 NOTE — Progress Notes (Signed)
 Per MD to hold treatment today due treatment plan changing. Pt updated and agrees to new treatment plan. Port deaccessed and Pt stable at discharge.  Marvin Carr [employer]

## 2023-12-06 NOTE — Progress Notes (Signed)
 [facility] [address] [place] [address]    Clinic Day:  12/06/2023  Referring physician: Benita Stabile, MD  Patient Care Team: Benita Stabile, MD as PCP - General (Internal Medicine) Jonelle Sidle, MD as PCP - Cardiology (Cardiology) Jonelle Sidle, MD as Consulting Physician (Cardiology) Doreatha Massed, MD as Medical Oncologist (Medical Oncology) [name], MD as Consulting Physician (Neurology) Nyoka Cowden, MD as Consulting Physician (Pulmonary Disease)   ASSESSMENT & PLAN:   Assessment: 1. Liver lesion/peritoneal carcinomatosis: - Patient seen at the request of Dr. [name] - Reported pressure on the sides of the abdomen with slight pain for the last 1 month.  He also reported pain in the epigastric region. - He had lost 20 pounds in the last 3 to 4 months intentionally, cutting back on sugars.  He was also started on semaglutide. - Colonoscopy on 06/28/2011 with a benign polypoid colonic mucosa in the descending colon.  No evidence of malignancy. - MRI of the brain was negative. - EGD and colonoscopy on 09/23/2018 did not reveal any malignancies. 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  IHC positive for CK7, CDX2 and negative for GATA3.  Findings suggestive of upper GI or pancreaticobiliary primary. - Cycle 1 of gemcitabine, cisplatin and durvalumab started on 10/05/2021. - NGS: No targetable mutations.  PD-L1 (UE454) negative.  MSI-stable.  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   2. Social/family history: - He lives at home by himself.  He records voiceover/narrations. - Non-smoker. - Father died of MDS.  Paternal grandfather had prostate cancer.  Maternal grandfather had cancer.  3.  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 Laverle Patter.  Reportedly received 1 treatment of intravesical chemo and has been on surveillance since then.  Last surveillance visit was  in 2020.    Plan: 1. Cholangiocarcinoma with peritoneal carcinomatosis: - He is tolerating durvalumab without any immunotherapy related side effects. - He does not report any abdominal pain but feels it is full.  No vomiting reported. - Reviewed CT CAP from 11/29/2023: Increased burden of peritoneal/mesenteric nodularity and new trace perihepatic fluid and increased mesenteric edema concerning for worsening peritoneal carcinomatosis. - CEA was 3.4 and CA 19-9 was normal.  CBC and LFTs are normal. - I discussed progression findings on CT scan.  I have recommended adding back chemotherapy.  We will add gemcitabine (800 mg/m) and cisplatin (25 mg/m) on day 1 and day 15 every 28 days.  He will continue durvalumab 1500 mg on day 1 only.  He will receive 1 L normal saline with electrolytes prior to cisplatin.  He will start his cycle 1 day 1 on 12/07/2023 and day 15 around 12/20/2023. - He has left hip replacement by Dr. Constance Goltz on 01/15/2024.  We will see him back 3 to 4 weeks after surgery to restart chemotherapy.    2. Fluid retention: - Continue Bumex as needed.  He is off of metolazone.  No ankle swellings.  3.  Neuropathy/"drawing" of legs: - Continue Mirapex 0.75 mg twice daily and gabapentin 300 mg 3 times daily.  4.  Sleeping difficulty: - Continue Sonata daily.  5.  Hypomagnesemia: - Continue magnesium twice daily.  Magnesium is normal.   6.  Right-sided lower back pain: - MRI of the lumbar spine and pelvis on 05/30/2023 with degenerative disc disease and no evidence of malignancy.    Orders Placed This Encounter  Procedures   TSH  Standing Status:   Future    Number of Occurrences:   1    Expected Date:   12/06/2023    Expiration Date:   12/06/2024   T4    Standing Status:   Future    Number of Occurrences:   1    Expected Date:   12/06/2023    Expiration Date:   12/06/2024   Magnesium    Standing Status:   Future    Expected Date:   12/07/2023    Expiration Date:   12/06/2024    CBC with Differential    Standing Status:   Future    Expected Date:   12/07/2023    Expiration Date:   12/06/2024   Comprehensive metabolic panel    Standing Status:   Future    Expected Date:   12/07/2023    Expiration Date:   12/06/2024   T4    Standing Status:   Future    Expected Date:   12/07/2023    Expiration Date:   12/06/2024   TSH    Standing Status:   Future    Expected Date:   12/07/2023    Expiration Date:   12/06/2024   Magnesium    Standing Status:   Future    Expected Date:   12/21/2023    Expiration Date:   12/20/2024   CBC with Differential    Standing Status:   Future    Expected Date:   12/21/2023    Expiration Date:   12/20/2024   Comprehensive metabolic panel    Standing Status:   Future    Expected Date:   12/21/2023    Expiration Date:   12/20/2024   Magnesium    Standing Status:   Future    Expected Date:   01/04/2024    Expiration Date:   01/03/2025   CBC with Differential    Standing Status:   Future    Expected Date:   01/04/2024    Expiration Date:   01/03/2025   Comprehensive metabolic panel    Standing Status:   Future    Expected Date:   01/04/2024    Expiration Date:   01/03/2025   Magnesium    Standing Status:   Future    Expected Date:   01/18/2024    Expiration Date:   01/17/2025   CBC with Differential    Standing Status:   Future    Expected Date:   01/18/2024    Expiration Date:   01/17/2025   Comprehensive metabolic panel    Standing Status:   Future    Expected Date:   01/18/2024    Expiration Date:   01/17/2025      Marvin Carr,acting as a scribe for Doreatha Massed, MD.,have documented all relevant documentation on the behalf of Doreatha Massed, MD,as directed by  Doreatha Massed, MD while in the presence of Doreatha Massed, MD.  I, Doreatha Massed MD, have reviewed the above documentation for accuracy and completeness, and I agree with the above.     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 male presenting to clinic today for follow up of cholangiocarcinoma and peritoneal carcinomatosis. He was last seen by me on 10/11/23.  Since his last visit, he underwent CT C/A/P on 11/29/23 that found: Increased burden of peritoneal/mesenteric nodularity and new trace perihepatic fluid and increased mesenteric edema, concerning for worsening peritoneal carcinomatosis. Similar gallbladder wall thickening and adjacent stranding, suggest correlation with right upper quadrant tenderness to palpation. Diffuse hepatic steatosis. Aortic atherosclerosis.  Mordecai has a left total hip arthroplasty scheduled for 01/15/24 under Dr. Charlann Boxer.   Today, he states that he is doing well overall. His appetite level is at 100%. His energy level is at 30%.   Montarius states he has significant arthritis in his left hip that's causes constant left hip pain. His hip replacement may be sooner if a cancellation is made.    He denies any abdominal pain or vomiting. Though he does note an on and off feeling of abdominal fullness.   March is willing to restart previous chemotherapy in response to cancer progression.   He reports a normal appetite and believes he drinks an adequate amount of fluids.   PAST MEDICAL HISTORY:   Past Medical History: Past Medical History:  Diagnosis Date   Anxiety    Bladder cancer (HCC) 09/11/2009   CAD (coronary artery disease), native coronary artery    a. Mildly elevated troponin 03/2013, cath with nonobstructive disease including 50% AV groove distal stenosis before large OM   Essential hypertension    Headache(784.0)    History of migraines   Hyperglycemia    Mixed hyperlipidemia    Neuropathy    Obesity    Port-A-Cath in place 09/30/2021   Pre-diabetes    Seasonal allergies    Sleep apnea    On CPAP    Surgical History: Past Surgical History:  Procedure Laterality Date   BIOPSY  09/23/2021   Procedure: BIOPSY;  Surgeon: Dolores Frame, MD;  Location: [facility];  Service: Gastroenterology;;   COLONOSCOPY  06/28/2011   Procedure: COLONOSCOPY;  Surgeon: Malissa Hippo, MD;  Location: [facility];  Service: Endoscopy;  Laterality: N/A;   COLONOSCOPY WITH PROPOFOL N/A 09/23/2021   Procedure: COLONOSCOPY WITH PROPOFOL;  Surgeon: Dolores Frame, MD;  Location: [facility];  Service: Gastroenterology;  Laterality: N/A;  940   ESOPHAGOGASTRODUODENOSCOPY (EGD) WITH PROPOFOL N/A 09/23/2021   Procedure: ESOPHAGOGASTRODUODENOSCOPY (EGD) WITH PROPOFOL;  Surgeon: Dolores Frame, MD;  Location: [facility];  Service: Gastroenterology;  Laterality: N/A;   IR IMAGING GUIDED PORT INSERTION  09/28/2021   IR PARACENTESIS  09/28/2021   JOINT REPLACEMENT Right    hip   LEFT HEART CATHETERIZATION WITH CORONARY ANGIOGRAM N/A 03/31/2013   Procedure: LEFT HEART CATHETERIZATION WITH CORONARY ANGIOGRAM;  Surgeon: [name], MD;  Location:  [facility] CATH LAB;  Service: Cardiovascular;  Laterality: N/A;   POLYPECTOMY  09/23/2021   Procedure: POLYPECTOMY;  Surgeon: Dolores Frame, MD;  Location: [facility];  Service: Gastroenterology;;   TURBT  09/11/2009    Social History: Social History   Socioeconomic History   Marital status: Divorced    Spouse name: Not on file   Number of children: 0   Years of education: college   Highest education level: Not on file  Occupational History    Employer: SELF-EMPLOYED  Tobacco Use   Smoking status: Former    Current packs/day: 0.00    Average packs/day: 0.5 packs/day for 10.0 years (5.0 ttl pk-yrs)    Types: Cigarettes    Start date: 07/09/2001    Quit date: 07/10/2011    Years since quitting: 12.4   Smokeless tobacco: Never   Tobacco comments:    Quit several yrs prior to 03/2013.  Vaping Use   Vaping status: Never Used  Substance and Sexual Activity   Alcohol use: Yes    Alcohol/week: 0.0 standard drinks of alcohol    Comment: Occasional   Drug use: No   Sexual activity: Not on file  Other Topics Concern   Not on file  Social History Narrative   Not on file   Social Drivers of Health   Financial Resource Strain: Not on file  Food Insecurity: No Food Insecurity (06/05/2022)   Hunger Vital Sign    Worried About Running Out of Food in the Last Year: Never true    Ran Out of Food in the Last Year: Never true  Transportation Needs: No Transportation Needs (06/05/2022)   PRAPARE - Administrator, Civil Service (Medical): No    Lack of Transportation (Non-Medical): No  Physical Activity: Not on file  Stress: Not on file  Social Connections: Not on file  Intimate Partner Violence: Not At Risk (06/05/2022)   Humiliation, Afraid, Rape, and Kick questionnaire    Fear of Current or Ex-Partner: No    Emotionally Abused: No    Physically Abused: No    Sexually Abused: No    Family History: Family History  Problem Relation Age of Onset   COPD Father      Current Medications:  Current Outpatient Medications:    amLODipine (NORVASC) 10 MG tablet, Take 1 tablet (10 mg total) by mouth daily., Disp: 90 tablet, Rfl: 3   aspirin EC 81 MG tablet, Take 81 mg by mouth daily., Disp: , Rfl:    bumetanide (BUMEX) 2 MG tablet, Take 1 tablet (2 mg total) by mouth daily as needed., Disp: , Rfl:    buPROPion (WELLBUTRIN XL) 300 MG 24 hr tablet, Take 300 mg by mouth daily., Disp: , Rfl:    cyclobenzaprine (FLEXERIL) 10 MG tablet, Take 10 mg by mouth at bedtime as needed for muscle spasms., Disp: , Rfl:    Durvalumab (IMFINZI IV), Inject into the vein every 21 ( twenty-one) days., Disp: , Rfl:    Eszopiclone 3 MG TABS, Take 3 mg by mouth at bedtime. Take immediately before bedtime, Disp: , Rfl:    famotidine (PEPCID) 20 MG tablet, Take 20 mg by mouth at bedtime., Disp: , Rfl:    fenofibrate 160 MG tablet, Take 160 mg by mouth daily., Disp: , Rfl:    FLUoxetine (PROZAC) 10 MG capsule, Take 10 mg by mouth daily., Disp: , Rfl:    gabapentin (NEURONTIN) 300 MG capsule, Take 300 mg by mouth every 6 (six) hours as needed., Disp: , Rfl:  magnesium oxide (MAG-OX) 400 (240 Mg) MG tablet, Take 1 tablet (400 mg total) by mouth 2 (two) times daily., Disp: 90 tablet, Rfl: 6   metFORMIN (GLUCOPHAGE-XR) 500 MG 24 hr tablet, Take 500 mg by mouth 2 (two) times daily., Disp: , Rfl:    metolazone (ZAROXOLYN) 5 MG tablet, Take 1 tablet (5 mg total) by mouth 3 (three) times a week., Disp: , Rfl:    nitroGLYCERIN (NITROSTAT) 0.4 MG SL tablet, Place 0.4 mg under the tongue every 5 (five) minutes x 3 doses as needed for chest pain (if no relief after 2nd dose, proceed to ED or call 911)., Disp: , Rfl:    olmesartan (BENICAR) 40 MG tablet, Take 40 mg by mouth daily., Disp: , Rfl:    omeprazole (PRILOSEC) 40 MG capsule, Take 40 mg by mouth daily., Disp: , Rfl:    pantoprazole (PROTONIX) 40 MG tablet, Take 1 tablet (40 mg total) by mouth daily., Disp: 90 tablet, Rfl: 0   potassium  chloride SA (KLOR-CON M) 20 MEQ tablet, Take 1 tablet by mouth daily., Disp: , Rfl:    pramipexole (MIRAPEX) 0.75 MG tablet, Take 1 tablet (0.75 mg total) by mouth 3 (three) times daily., Disp: 90 tablet, Rfl: 0   predniSONE (DELTASONE) 5 MG tablet, Take as directed for 12 days, Disp: , Rfl:    RYBELSUS 7 MG TABS, Take 14 mg by mouth daily., Disp: , Rfl:    tamsulosin (FLOMAX) 0.4 MG CAPS capsule, Take 0.4 mg by mouth daily., Disp: , Rfl:    traMADol (ULTRAM) 50 MG tablet, Take 50 mg by mouth 4 (four) times daily as needed for moderate pain., Disp: , Rfl:    zaleplon (SONATA) 10 MG capsule, Take 1 capsule (10 mg total) by mouth at bedtime as needed for sleep., Disp: 30 capsule, Rfl: 5 No current facility-administered medications for this visit.  Facility-Administered Medications Ordered in Other Visits:    magnesium sulfate 2 GM/50ML IVPB, , , ,    Allergies: No Known Allergies  REVIEW OF SYSTEMS:   Review of Systems  Constitutional:  Negative for chills, fatigue and fever.  HENT:   Negative for lump/mass, mouth sores, nosebleeds, sore throat and trouble swallowing.   Eyes:  Negative for eye problems.  Respiratory:  Positive for cough. Negative for shortness of breath.   Cardiovascular:  Negative for chest pain, leg swelling and palpitations.  Gastrointestinal:  Positive for constipation. Negative for abdominal pain, diarrhea, nausea and vomiting.  Genitourinary:  Negative for bladder incontinence, difficulty urinating, dysuria, frequency, hematuria and nocturia.   Musculoskeletal:  Positive for arthralgias (left hip, 9/10 severity). Negative for back pain, flank pain, myalgias and neck pain.  Skin:  Negative for itching and rash.  Neurological:  Negative for dizziness, headaches and numbness.  Hematological:  Does not bruise/bleed easily.  Psychiatric/Behavioral:  Negative for depression, sleep disturbance and suicidal ideas. The patient is not nervous/anxious.   All other systems  reviewed and are negative.    VITALS:   Blood pressure 133/80.  Wt Readings from Last 3 Encounters:  12/06/23 289 lb 14.4 oz (131.5 kg)  11/07/23 297 lb (134.7 kg)  10/11/23 294 lb 1.5 oz (133.4 kg)    There is no height or weight on file to calculate BMI.  Performance status (ECOG): 1 - Symptomatic but completely ambulatory  PHYSICAL EXAM:   Physical Exam Vitals and nursing note reviewed. Exam conducted with a chaperone present.  Constitutional:      Appearance: Normal appearance.  Cardiovascular:     Rate and Rhythm: Normal rate and regular rhythm.     Pulses: Normal pulses.     Heart sounds: Normal heart sounds.  Pulmonary:     Effort: Pulmonary effort is normal.     Breath sounds: Normal breath sounds.  Abdominal:     Palpations: Abdomen is soft. There is no hepatomegaly, splenomegaly or mass.     Tenderness: There is no abdominal tenderness.  Musculoskeletal:     Right lower leg: No edema.     Left lower leg: No edema.  Lymphadenopathy:     Cervical: No cervical adenopathy.     Right cervical: No superficial, deep or posterior cervical adenopathy.    Left cervical: No superficial, deep or posterior cervical adenopathy.     Upper Body:     Right upper body: No supraclavicular or axillary adenopathy.     Left upper body: No supraclavicular or axillary adenopathy.  Neurological:     General: No focal deficit present.     Mental Status: He is alert and oriented to person, place, and time.  Psychiatric:        Mood and Affect: Mood normal.        Behavior: Behavior normal.     LABS:      Latest Ref Rng & Units 12/06/2023    1:17 PM 11/07/2023    8:24 AM 10/11/2023   12:35 PM  CBC  WBC 4.0 - 10.5 K/uL 8.9  5.2  9.8   Hemoglobin 13.0 - 17.0 g/dL 16.1  09.6  04.5   Hematocrit 39.0 - 52.0 % 43.4  40.0  42.1   Platelets 150 - 400 K/uL 347  279  288       Latest Ref Rng & Units 12/06/2023    1:17 PM 11/07/2023    8:24 AM 10/11/2023   12:35 PM  CMP  Glucose 70  - 99 mg/dL 409  811  914   BUN 8 - 23 mg/dL 32  16  32   Creatinine 0.61 - 1.24 mg/dL 7.82  9.56  2.13   Sodium 135 - 145 mmol/L 135  138  134   Potassium 3.5 - 5.1 mmol/L 4.0  3.8  4.2   Chloride 98 - 111 mmol/L 98  101  101   CO2 22 - 32 mmol/L 24  24  24    Calcium 8.9 - 10.3 mg/dL 9.9  9.3  9.3   Total Protein 6.5 - 8.1 g/dL 7.6  6.3  6.7   Total Bilirubin 0.0 - 1.2 mg/dL 0.4  0.4  0.3   Alkaline Phos 38 - 126 U/L 73  77  78   AST 15 - 41 U/L 19  21  19    ALT 0 - 44 U/L 18  23  22       Lab Results  Component Value Date   CEA1 3.4 10/11/2023   /  CEA  Date Value Ref Range Status  10/11/2023 3.4 0.0 - 4.7 ng/mL Final    Comment:    (NOTE)                             Nonsmokers          <3.9                             Smokers             <  5.6 [employer] Electrochemiluminescence Immunoassay (ECLIA) Values obtained with different assay methods or kits cannot be used interchangeably.  Results cannot be interpreted as absolute evidence of the presence or absence of malignant disease. Performed At: [facility] [address] [place], [place] [id number] Jolene Schimke MD MV:[id number]    No results found for: "PSA1" Lab Results  Component Value Date   WUX324 20 10/11/2023   No results found for: "CAN125"  No results found for: "TOTALPROTELP", "ALBUMINELP", "A1GS", "A2GS", "BETS", "BETA2SER", "GAMS", "MSPIKE", "SPEI" Lab Results  Component Value Date   TIBC 428 01/29/2023   TIBC 457 (H) 01/04/2022   FERRITIN 183 01/29/2023   FERRITIN 522 (H) 01/04/2022   IRONPCTSAT 15 01/29/2023   IRONPCTSAT 16 (L) 01/04/2022   Lab Results  Component Value Date   LDH 266 (H) 10/05/2021   LDH 242 (H) 09/01/2021     STUDIES:   CT CHEST ABDOMEN PELVIS W CONTRAST Result Date: 11/29/2023 CLINICAL DATA:  Metastatic cholangiocarcinoma, follow-up. * Tracking Code: BO * EXAM: CT CHEST, ABDOMEN, AND PELVIS WITH CONTRAST TECHNIQUE: Multidetector CT imaging of the chest,  abdomen and pelvis was performed following the standard protocol during bolus administration of intravenous contrast. RADIATION DOSE REDUCTION: This exam was performed according to the departmental dose-optimization program which includes automated exposure control, adjustment of the mA and/or kV according to patient size and/or use of iterative reconstruction technique. CONTRAST:  OMNIPAQUE IOHEXOL 300 MG/ML  SOLN COMPARISON:  Multiple priors including CT October 02, 2023 FINDINGS: CT CHEST FINDINGS Cardiovascular: Right chest Port-A-Cath with tip near the superior cavoatrial junction. Aortic atherosclerosis. Normal size heart. Scattered coronary artery calcifications. Mediastinum/Nodes: No suspicious thyroid nodule. No pathologically enlarged mediastinal, hilar or axillary lymph nodes. The esophagus is grossly unremarkable. Lungs/Pleura: No suspicious pulmonary nodules or masses. Similar mild subpleural reticulations. Musculoskeletal: At the no aggressive lytic or blastic lesion of bone. CT ABDOMEN PELVIS FINDINGS Hepatobiliary: Diffuse hepatic steatosis. Similar contour nodularity of the liver. Gallbladder wall thickening with adjacent stranding similar to prior. Cholelithiasis. Gallbladder is nondilated. No biliary ductal dilation. Pancreas: No pancreatic ductal dilation or evidence of acute inflammation. Spleen: No splenomegaly. Adrenals/Urinary Tract: No suspicious adrenal nodule/mass. No hydronephrosis. Kidneys demonstrate symmetric enhancement. Urinary bladder is unremarkable for degree of distension. Stomach/Bowel: Stomach is unremarkable for degree of distension. No pathologic dilation of small or large bowel. Colonic diverticulosis. Vascular/Lymphatic: Aortic atherosclerosis. Smooth IVC contours. The portal, splenic and superior mesenteric veins are patent. No pathologically enlarged abdominal or pelvic lymph nodes. Reproductive: Prostate is unremarkable. Other: Increased burden of  peritoneal/mesenteric nodularity for instance in the right lower quadrant adjacent to the cecum/appendix on image 87/2 anterior to the splenic flexure on image 82/2 along the anterior omentum on image 93/2 and anterior to the spleen on image 58/2. New trace perihepatic fluid for instance on image 66/2. Slightly increased mesenteric edema. Musculoskeletal: No aggressive lytic or blastic lesion of bone. Multilevel degenerative changes spine. Right total hip arthroplasty. Degenerative change of the left hip. IMPRESSION: 1. Increased burden of peritoneal/mesenteric nodularity and new trace perihepatic fluid and increased mesenteric edema, concerning for worsening peritoneal carcinomatosis. 2. Similar gallbladder wall thickening and adjacent stranding, suggest correlation with right upper quadrant tenderness to palpation. 3. Diffuse hepatic steatosis. 4. Aortic atherosclerosis. Electronically Signed   By: Maudry Mayhew M.D.   On: 11/29/2023 17:03

## 2023-12-06 NOTE — Progress Notes (Signed)
 Pharmacist Chemotherapy Monitoring - Initial Assessment    Anticipated start date: 12/07/23   The following has been reviewed per standard work regarding the patient's treatment regimen: The patient's diagnosis, treatment plan and drug doses, and organ/hematologic function Lab orders and baseline tests specific to treatment regimen  The treatment plan start date, drug sequencing, and pre-medications Prior authorization status  Patient's documented medication list, including drug-drug interaction screen and prescriptions for anti-emetics and supportive care specific to the treatment regimen The drug concentrations, fluid compatibility, administration routes, and timing of the medications to be used The patient's access for treatment and lifetime cumulative dose history, if applicable  The patient's medication allergies and previous infusion related reactions, if applicable   Changes made to treatment plan:  Plan changed to cisplatin, gemcitabine, D1, 15 + durvalumab D1 q28 per Dr. Kirtland Bouchard. Tx plan name and dates updated.  Follow up needed:  Pt was previously on this regimen in 2023. Developed neuropathies from cisplatin requiring last dose to be dose-reduced to 22.5mg /m2. Pt also experienced AKI and leg edema. Per Dr. Ellin Saba, will proceed with dose-reduction of gemcitabine (800mg /m2) for now and will consider dose-reduction of cisplatin if pt experiences neuropathies with this cycle.   Drusilla Kanner, PharmD, MBA

## 2023-12-06 NOTE — Patient Instructions (Addendum)
 [facility] at [facility] Discharge Instructions   You were seen and examined today by Dr. Ellin Saba.  He reviewed the results of your lab work which are normal/stable.   He reviewed the results of your CT scan which shows the cancer has increased.   We will hold your treatment today. We will start you back on treatment with cisplatin and gemcitabine.   Return as scheduled.    Thank you for choosing [facility] at [facility] to provide your oncology and hematology care.  To afford each patient quality time with our provider, please arrive at least 15 minutes before your scheduled appointment time.   If you have a lab appointment with the Cancer Center please come in thru the Main Entrance and check in at the main information desk.  You need to re-schedule your appointment should you arrive 10 or more minutes late.  We strive to give you quality time with our providers, and arriving late affects you and other patients whose appointments are after yours.  Also, if you no show three or more times for appointments you may be dismissed from the clinic at the providers discretion.     Again, thank you for choosing [facility].  Our hope is that these requests will decrease the amount of time that you wait before being seen by our physicians.       _____________________________________________________________  Should you have questions after your visit to [facility], please contact our office at [phone] and follow the prompts.  Our office hours are 8:00 a.m. and 4:30 p.m. Monday - Friday.  Please note that voicemails left after 4:00 p.m. may not be returned until the following business day.  We are closed weekends and major holidays.  You do have access to a nurse 24-7, just call the main number to the clinic [phone] and do not press any options, hold on the line and a nurse will answer the phone.    For  prescription refill requests, have your pharmacy contact our office and allow 72 hours.    Due to Covid, you will need to wear a mask upon entering the hospital. If you do not have a mask, a mask will be given to you at the Main Entrance upon arrival. For doctor visits, patients may have 1 support person age 73 or older with them. For treatment visits, patients can not have anyone with them due to social distancing guidelines and our immunocompromised population.

## 2023-12-07 ENCOUNTER — Inpatient Hospital Stay

## 2023-12-07 VITALS — BP 122/77 | HR 70 | Temp 97.6°F | Resp 20

## 2023-12-07 DIAGNOSIS — Z95828 Presence of other vascular implants and grafts: Secondary | ICD-10-CM

## 2023-12-07 DIAGNOSIS — Z5112 Encounter for antineoplastic immunotherapy: Secondary | ICD-10-CM | POA: Diagnosis not present

## 2023-12-07 DIAGNOSIS — C221 Intrahepatic bile duct carcinoma: Secondary | ICD-10-CM

## 2023-12-07 LAB — CANCER ANTIGEN 19-9: CA 19-9: 41 U/mL — ABNORMAL HIGH (ref 0–35)

## 2023-12-07 LAB — T4: T4, Total: 6.7 ug/dL (ref 4.5–12.0)

## 2023-12-07 MED ORDER — MAGNESIUM SULFATE 2 GM/50ML IV SOLN
2.0000 g | Freq: Once | INTRAVENOUS | Status: AC
  Administered 2023-12-07: 2 g via INTRAVENOUS
  Filled 2023-12-07: qty 50

## 2023-12-07 MED ORDER — SODIUM CHLORIDE 0.9 % IV SOLN
1500.0000 mg | Freq: Once | INTRAVENOUS | Status: AC
  Administered 2023-12-07: 1500 mg via INTRAVENOUS
  Filled 2023-12-07: qty 30

## 2023-12-07 MED ORDER — SODIUM CHLORIDE 0.9 % IV SOLN
150.0000 mg | Freq: Once | INTRAVENOUS | Status: AC
  Administered 2023-12-07: 150 mg via INTRAVENOUS
  Filled 2023-12-07: qty 150

## 2023-12-07 MED ORDER — SODIUM CHLORIDE 0.9 % IV SOLN
25.0000 mg/m2 | Freq: Once | INTRAVENOUS | Status: AC
  Administered 2023-12-07: 65 mg via INTRAVENOUS
  Filled 2023-12-07: qty 65

## 2023-12-07 MED ORDER — SODIUM CHLORIDE 0.9 % IV SOLN: INTRAVENOUS | Status: DC

## 2023-12-07 MED ORDER — PROCHLORPERAZINE MALEATE 10 MG PO TABS: 10.0000 mg | ORAL_TABLET | Freq: Four times a day (QID) | ORAL | 2 refills | Status: DC | PRN

## 2023-12-07 MED ORDER — SODIUM CHLORIDE 0.9 % IV SOLN
800.0000 mg/m2 | Freq: Once | INTRAVENOUS | Status: AC
  Administered 2023-12-07: 2052 mg via INTRAVENOUS
  Filled 2023-12-07: qty 53.97

## 2023-12-07 MED ORDER — HEPARIN SOD (PORK) LOCK FLUSH 100 UNIT/ML IV SOLN
500.0000 [IU] | Freq: Once | INTRAVENOUS | Status: AC | PRN
  Administered 2023-12-07: 500 [IU]

## 2023-12-07 MED ORDER — PALONOSETRON HCL INJECTION 0.25 MG/5ML
0.2500 mg | Freq: Once | INTRAVENOUS | Status: AC
  Administered 2023-12-07: 0.25 mg via INTRAVENOUS
  Filled 2023-12-07: qty 5

## 2023-12-07 MED ORDER — DEXAMETHASONE SODIUM PHOSPHATE 10 MG/ML IJ SOLN
10.0000 mg | Freq: Once | INTRAMUSCULAR | Status: AC
  Administered 2023-12-07: 10 mg via INTRAVENOUS
  Filled 2023-12-07: qty 1

## 2023-12-07 MED ORDER — POTASSIUM CHLORIDE IN NACL 20-0.9 MEQ/L-% IV SOLN
Freq: Once | INTRAVENOUS | Status: AC
  Filled 2023-12-07: qty 1000

## 2023-12-07 MED ORDER — SODIUM CHLORIDE 0.9% FLUSH
10.0000 mL | INTRAVENOUS | Status: DC | PRN
  Administered 2023-12-07: 10 mL

## 2023-12-07 NOTE — Patient Instructions (Signed)
 [facility] [place] - A DEPT OF [facility]  Discharge Instructions: Thank you for choosing [facility] to provide your oncology and hematology care.  If you have a lab appointment with the Cancer Center - please note that after April 8th, 2024, all labs will be drawn in the cancer center.  You do not have to check in or register with the main entrance as you have in the past but will complete your check-in in the cancer center.  Wear comfortable clothing and clothing appropriate for easy access to any Portacath or PICC line.   We strive to give you quality time with your provider. You may need to reschedule your appointment if you arrive late (15 or more minutes).  Arriving late affects you and other patients whose appointments are after yours.  Also, if you miss three or more appointments without notifying the office, you may be dismissed from the clinic at the provider's discretion.      For prescription refill requests, have your pharmacy contact our office and allow 72 hours for refills to be completed.    Today you received the following chemotherapy and/or immunotherapy agents imfinzi, gemzar, cisplatin      To help prevent nausea and vomiting after your treatment, we encourage you to take your nausea medication as directed.  BELOW ARE SYMPTOMS THAT SHOULD BE REPORTED IMMEDIATELY: *FEVER GREATER THAN 100.4 F (38 C) OR HIGHER *CHILLS OR SWEATING *NAUSEA AND VOMITING THAT IS NOT CONTROLLED WITH YOUR NAUSEA MEDICATION *UNUSUAL SHORTNESS OF BREATH *UNUSUAL BRUISING OR BLEEDING *URINARY PROBLEMS (pain or burning when urinating, or frequent urination) *BOWEL PROBLEMS (unusual diarrhea, constipation, pain near the anus) TENDERNESS IN MOUTH AND THROAT WITH OR WITHOUT PRESENCE OF ULCERS (sore throat, sores in mouth, or a toothache) UNUSUAL RASH, SWELLING OR PAIN  UNUSUAL VAGINAL DISCHARGE OR ITCHING   Items with * indicate a potential emergency and  should be followed up as soon as possible or go to the Emergency Department if any problems should occur.  Please show the CHEMOTHERAPY ALERT CARD or IMMUNOTHERAPY ALERT CARD at check-in to the Emergency Department and triage nurse.  Should you have questions after your visit or need to cancel or reschedule your appointment, please contact [facility] [place] - A DEPT OF [facility] [phone]  and follow the prompts.  Office hours are 8:00 a.m. to 4:30 p.m. Monday - Friday. Please note that voicemails left after 4:00 p.m. may not be returned until the following business day.  We are closed weekends and major holidays. You have access to a nurse at all times for urgent questions. Please call the main number to the clinic [phone] and follow the prompts.  For any non-urgent questions, you may also contact your provider using MyChart. We now offer e-Visits for anyone 4 and older to request care online for non-urgent symptoms. For details visit [email or web address].   Also download the MyChart app! Go to the app store, search "MyChart", open the app, select [place], and log in with your MyChart username and password.

## 2023-12-07 NOTE — Progress Notes (Signed)
 Increase Potassium hydration to 524ml/hr and No need for post hydration Verbal order Dr. Ellin Saba.  Also, instruct the patient to drink 2-3 liters fluid daily per oncologist.   Patient tolerated chemotherapy with no complaints voiced.  Side effects with management reviewed with understanding verbalized.  Port site clean and dry with no bruising or swelling noted at site.  Good blood return noted before and after administration of chemotherapy.  Band aid applied.  Patient left in satisfactory condition with VSS and no s/s of distress noted.

## 2023-12-12 ENCOUNTER — Encounter: Payer: Self-pay | Admitting: Hematology

## 2023-12-13 ENCOUNTER — Encounter: Payer: Self-pay | Admitting: Hematology

## 2023-12-19 ENCOUNTER — Encounter: Payer: Self-pay | Admitting: Hematology

## 2023-12-20 ENCOUNTER — Ambulatory Visit: Admitting: Hematology

## 2023-12-20 ENCOUNTER — Encounter: Payer: Self-pay | Admitting: Hematology

## 2023-12-20 ENCOUNTER — Other Ambulatory Visit

## 2023-12-20 ENCOUNTER — Inpatient Hospital Stay (HOSPITAL_BASED_OUTPATIENT_CLINIC_OR_DEPARTMENT_OTHER): Admitting: Hematology

## 2023-12-20 ENCOUNTER — Inpatient Hospital Stay: Attending: Hematology

## 2023-12-20 ENCOUNTER — Inpatient Hospital Stay

## 2023-12-20 ENCOUNTER — Ambulatory Visit

## 2023-12-20 VITALS — BP 125/73 | HR 85 | Temp 97.6°F | Resp 20 | Wt 294.8 lb

## 2023-12-20 DIAGNOSIS — Z87891 Personal history of nicotine dependence: Secondary | ICD-10-CM | POA: Insufficient documentation

## 2023-12-20 DIAGNOSIS — Z95828 Presence of other vascular implants and grafts: Secondary | ICD-10-CM

## 2023-12-20 DIAGNOSIS — Z5111 Encounter for antineoplastic chemotherapy: Secondary | ICD-10-CM | POA: Diagnosis present

## 2023-12-20 DIAGNOSIS — R609 Edema, unspecified: Secondary | ICD-10-CM

## 2023-12-20 DIAGNOSIS — C221 Intrahepatic bile duct carcinoma: Secondary | ICD-10-CM | POA: Insufficient documentation

## 2023-12-20 DIAGNOSIS — Z8551 Personal history of malignant neoplasm of bladder: Secondary | ICD-10-CM | POA: Diagnosis not present

## 2023-12-20 DIAGNOSIS — G479 Sleep disorder, unspecified: Secondary | ICD-10-CM

## 2023-12-20 DIAGNOSIS — Z79899 Other long term (current) drug therapy: Secondary | ICD-10-CM | POA: Insufficient documentation

## 2023-12-20 DIAGNOSIS — M545 Low back pain, unspecified: Secondary | ICD-10-CM

## 2023-12-20 DIAGNOSIS — R5383 Other fatigue: Secondary | ICD-10-CM | POA: Insufficient documentation

## 2023-12-20 DIAGNOSIS — F32A Depression, unspecified: Secondary | ICD-10-CM | POA: Insufficient documentation

## 2023-12-20 DIAGNOSIS — G629 Polyneuropathy, unspecified: Secondary | ICD-10-CM | POA: Insufficient documentation

## 2023-12-20 DIAGNOSIS — G620 Drug-induced polyneuropathy: Secondary | ICD-10-CM

## 2023-12-20 LAB — COMPREHENSIVE METABOLIC PANEL WITH GFR
ALT: 35 U/L (ref 0–44)
AST: 22 U/L (ref 15–41)
Albumin: 3.5 g/dL (ref 3.5–5.0)
Alkaline Phosphatase: 88 U/L (ref 38–126)
Anion gap: 11 (ref 5–15)
BUN: 35 mg/dL — ABNORMAL HIGH (ref 8–23)
CO2: 24 mmol/L (ref 22–32)
Calcium: 9.1 mg/dL (ref 8.9–10.3)
Chloride: 98 mmol/L (ref 98–111)
Creatinine, Ser: 1.14 mg/dL (ref 0.61–1.24)
GFR, Estimated: >60 mL/min (ref >60)
Glucose, Bld: 160 mg/dL — ABNORMAL HIGH (ref 70–99)
Potassium: 3.8 mmol/L (ref 3.5–5.1)
Sodium: 133 mmol/L — ABNORMAL LOW (ref 135–145)
Total Bilirubin: 0.4 mg/dL (ref 0.0–1.2)
Total Protein: 6.3 g/dL — ABNORMAL LOW (ref 6.5–8.1)

## 2023-12-20 LAB — CBC WITH DIFFERENTIAL/PLATELET
Abs Immature Granulocytes: 0.06 10*3/uL (ref 0.00–0.07)
Basophils Absolute: 0 10*3/uL (ref 0.0–0.1)
Basophils Relative: 0 %
Eosinophils Absolute: 0.2 10*3/uL (ref 0.0–0.5)
Eosinophils Relative: 3 %
HCT: 39.5 % (ref 39.0–52.0)
Hemoglobin: 12.7 g/dL — ABNORMAL LOW (ref 13.0–17.0)
Immature Granulocytes: 1 %
Lymphocytes Relative: 23 %
Lymphs Abs: 1.3 10*3/uL (ref 0.7–4.0)
MCH: 29 pg (ref 26.0–34.0)
MCHC: 32.2 g/dL (ref 30.0–36.0)
MCV: 90.2 fL (ref 80.0–100.0)
Monocytes Absolute: 0.6 10*3/uL (ref 0.1–1.0)
Monocytes Relative: 11 %
Neutro Abs: 3.4 10*3/uL (ref 1.7–7.7)
Neutrophils Relative %: 62 %
Platelets: 231 10*3/uL (ref 150–400)
RBC: 4.38 MIL/uL (ref 4.22–5.81)
RDW: 15.5 % (ref 11.5–15.5)
WBC: 5.5 10*3/uL (ref 4.0–10.5)
nRBC: 0 % (ref 0.0–0.2)

## 2023-12-20 LAB — MAGNESIUM: Magnesium: 1.9 mg/dL (ref 1.7–2.4)

## 2023-12-20 MED ORDER — PALONOSETRON HCL INJECTION 0.25 MG/5ML
0.2500 mg | Freq: Once | INTRAVENOUS | Status: AC
  Administered 2023-12-20: 0.25 mg via INTRAVENOUS
  Filled 2023-12-20: qty 5

## 2023-12-20 MED ORDER — DEXAMETHASONE SODIUM PHOSPHATE 10 MG/ML IJ SOLN
10.0000 mg | Freq: Once | INTRAMUSCULAR | Status: AC
  Administered 2023-12-20: 10 mg via INTRAVENOUS
  Filled 2023-12-20: qty 1

## 2023-12-20 MED ORDER — MAGNESIUM SULFATE 2 GM/50ML IV SOLN
2.0000 g | Freq: Once | INTRAVENOUS | Status: AC
  Administered 2023-12-20: 2 g via INTRAVENOUS
  Filled 2023-12-20: qty 50

## 2023-12-20 MED ORDER — SODIUM CHLORIDE 0.9 % IV SOLN
800.0000 mg/m2 | Freq: Once | INTRAVENOUS | Status: AC
  Administered 2023-12-20: 2052 mg via INTRAVENOUS
  Filled 2023-12-20: qty 53.97

## 2023-12-20 MED ORDER — SODIUM CHLORIDE 0.9% FLUSH
10.0000 mL | INTRAVENOUS | Status: DC | PRN
  Administered 2023-12-20: 10 mL

## 2023-12-20 MED ORDER — SODIUM CHLORIDE 0.9 % IV SOLN
25.0000 mg/m2 | Freq: Once | INTRAVENOUS | Status: AC
  Administered 2023-12-20: 65 mg via INTRAVENOUS
  Filled 2023-12-20: qty 65

## 2023-12-20 MED ORDER — SODIUM CHLORIDE 0.9 % IV SOLN
150.0000 mg | Freq: Once | INTRAVENOUS | Status: AC
  Administered 2023-12-20: 150 mg via INTRAVENOUS
  Filled 2023-12-20: qty 150

## 2023-12-20 MED ORDER — POTASSIUM CHLORIDE IN NACL 20-0.9 MEQ/L-% IV SOLN
Freq: Once | INTRAVENOUS | Status: AC
  Filled 2023-12-20: qty 1000

## 2023-12-20 MED ORDER — SODIUM CHLORIDE 0.9 % IV SOLN: INTRAVENOUS | Status: DC

## 2023-12-20 MED ORDER — SODIUM CHLORIDE 0.9 % IV SOLN: Freq: Once | INTRAVENOUS | Status: AC

## 2023-12-20 MED ORDER — HEPARIN SOD (PORK) LOCK FLUSH 100 UNIT/ML IV SOLN
500.0000 [IU] | Freq: Once | INTRAVENOUS | Status: AC | PRN
  Administered 2023-12-20: 500 [IU]

## 2023-12-20 MED ORDER — CALCIUM CARBONATE ANTACID 500 MG PO CHEW
2.0000 | CHEWABLE_TABLET | Freq: Once | ORAL | Status: AC
  Administered 2023-12-20: 400 mg via ORAL
  Filled 2023-12-20: qty 2

## 2023-12-20 NOTE — Progress Notes (Addendum)
 Patient presents today for Gemzar/Cisplatin infusion. Patient is in satisfactory condition with no new complaints voiced. Vital signs are stable.  Labs reviewed by Dr. Ellin Saba during the office visit and all labs are within treatment parameters.  We will proceed with treatment per MD orders.   Patient urinated 600 mL prior to Cisplatin administration.   Treatment given today per MD orders. Tolerated infusion without adverse affects. Vital signs stable. No complaints at this time. Discharged from clinic ambulatory in stable condition. Alert and oriented x 3. F/U with [facility] as scheduled.

## 2023-12-20 NOTE — Progress Notes (Signed)
 [facility] [address], [place] [address]    Clinic Day:  12/20/2023  Referring physician: Benita Stabile, MD  Patient Care Team: Benita Stabile, MD as PCP - General (Internal Medicine) Jonelle Sidle, MD as PCP - Cardiology (Cardiology) Jonelle Sidle, MD as Consulting Physician (Cardiology) Doreatha Massed, MD as Medical Oncologist (Medical Oncology) [name], MD as Consulting Physician (Neurology) Nyoka Cowden, MD as Consulting Physician (Pulmonary Disease)   ASSESSMENT & PLAN:   Assessment: 1. Liver lesion/peritoneal carcinomatosis: - Patient seen at the request of Dr. [name] - Reported pressure on the sides of the abdomen with slight pain for the last 1 month.  He also reported pain in the epigastric region. - He had lost 20 pounds in the last 3 to 4 months intentionally, cutting back on sugars.  He was also started on semaglutide. - Colonoscopy on 06/28/2011 with a benign polypoid colonic mucosa in the descending colon.  No evidence of malignancy. - MRI of the brain was negative. - EGD and colonoscopy on 09/23/2018 did not reveal any malignancies. 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  IHC positive for CK7, CDX2 and negative for GATA3.  Findings suggestive of upper GI or pancreaticobiliary primary. - Cycle 1 of gemcitabine, cisplatin and durvalumab started on 10/05/2021. - NGS: No targetable mutations.  PD-L1 (AO130) negative.  MSI-stable.  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   2. Social/family history: - He lives at home by himself.  He records voiceover/narrations. - Non-smoker. - Father died of MDS.  Paternal grandfather had prostate cancer.  Maternal grandfather had cancer.  3.  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 Laverle Patter.  Reportedly received 1 treatment of intravesical chemo and has been on surveillance since then.  Last surveillance visit was  in 2020.    Plan: 1. Cholangiocarcinoma with peritoneal carcinomatosis: - CT CAP from 11/29/2023: Increased burden of peritoneal/mesenteric nodularity and new trace perihepatic fluid and increased mesenteric edema concerning for worsening peritoneal carcinomatosis. - We have restarted him back on cisplatin and gemcitabine and durvalumab on on 12/20/2023.  He tolerated it very well.  He had fatigue but denied any GI symptoms.  Numbness in the feet and toes has been stable. - Labs today: Normal LFTs and creatinine.  CBC grossly normal.  TSH is 2.6.  CA 19-9 is 41. - He may proceed with day 15 of treatment today.  He has hip replacement by Dr. Constance Goltz on 01/15/2024.  Will see him back in 3 weeks after surgery to restart therapy.   2. Fluid retention: - Continue Bumex as needed.  He is off of metolazone.  No significant swelling noted.  3.  Neuropathy/"drawing" of legs: - Continue Mirapex 3 times daily and gabapentin 300 mg 3 times daily.  He has numbness in the toes and bottom of the feet which is constant and stable.  4.  Sleeping difficulty: - Continue Sonata daily as needed.  5.  Hypomagnesemia: - Continue magnesium twice daily.  Magnesium is normal.   6.  Right-sided lower back pain: - MRI lumbar spine and pelvis on 05/30/2023 with degenerative disc disease and no evidence of malignancy.    No orders of the defined types were placed in this encounter.     Alben Deeds Teague,acting as a Neurosurgeon for Doreatha Massed, MD.,have documented all relevant documentation on the behalf of Doreatha Massed, MD,as directed by  Doreatha Massed, MD while in the  presence of Doreatha Massed, MD.  I, Doreatha Massed MD, have reviewed the above documentation for accuracy and completeness, and I agree with the above.    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 male presenting to clinic today for follow up of cholangiocarcinoma and peritoneal carcinomatosis. He was last seen by me on 12/06/23.  Today, he states that he is doing well overall. His appetite level is at 100%. His energy level is at 25%.   Isom tolerated his last infusion well and experienced mild fatigue. He denies any nausea or GI symptoms. He is taking Bumex daily, gabapentin TID, Mirapex TID, magnesium BID, Sonata daily.   Reichen notes constant tingling and numbness in the toes and feet with an onset prior to starting his last infusion. He denies any neuropathy in the fingertips.   PAST MEDICAL HISTORY:   Past Medical History: Past Medical History:  Diagnosis Date   Anxiety    Bladder cancer (HCC) 09/11/2009   CAD (coronary artery disease), native coronary artery    a. Mildly elevated troponin 03/2013, cath with nonobstructive disease including 50% AV groove distal stenosis before large OM   Essential hypertension    Headache(784.0)    History of migraines   Hyperglycemia     Mixed hyperlipidemia    Neuropathy    Obesity    Port-A-Cath in place 09/30/2021   Pre-diabetes    Seasonal allergies    Sleep apnea    On CPAP    Surgical History: Past Surgical History:  Procedure Laterality Date   BIOPSY  09/23/2021   Procedure: BIOPSY;  Surgeon: Dolores Frame, MD;  [facility];  Service: Gastroenterology;;   COLONOSCOPY  06/28/2011   Procedure: COLONOSCOPY;  Surgeon: Malissa Hippo, MD;  [facility];  Service: Endoscopy;  Laterality: N/A;   COLONOSCOPY WITH PROPOFOL N/A 09/23/2021   Procedure: COLONOSCOPY WITH PROPOFOL;  Surgeon: Dolores Frame, MD;  [facility]: [facility];  Service: Gastroenterology;  Laterality: N/A;  940   ESOPHAGOGASTRODUODENOSCOPY (EGD) WITH PROPOFOL N/A 09/23/2021   Procedure: ESOPHAGOGASTRODUODENOSCOPY (EGD) WITH PROPOFOL;  Surgeon: Dolores Frame, MD;  [facility]: [facility];  Service: Gastroenterology;  Laterality: N/A;   IR IMAGING GUIDED PORT INSERTION  09/28/2021   IR PARACENTESIS  09/28/2021   JOINT REPLACEMENT Right    hip   LEFT HEART CATHETERIZATION WITH CORONARY ANGIOGRAM N/A 03/31/2013   Procedure: LEFT HEART CATHETERIZATION WITH CORONARY  ANGIOGRAM;  Surgeon: [name], MD;  Location: [facility] CATH LAB;  Service: Cardiovascular;  Laterality: N/A;   POLYPECTOMY  09/23/2021   Procedure: POLYPECTOMY;  Surgeon: Dolores Frame, MD;  Location: [facility];  Service: Gastroenterology;;   TURBT  09/11/2009    Social History: Social History   Socioeconomic History   Marital status: Divorced    Spouse name: Not on file   Number of children: 0   Years of education: college   Highest education level: Not on file  Occupational History    Employer: SELF-EMPLOYED  Tobacco Use   Smoking status: Former    Current packs/day: 0.00    Average packs/day: 0.5 packs/day for 10.0 years (5.0 ttl pk-yrs)    Types: Cigarettes    Start date: 07/09/2001    Quit date:  07/10/2011    Years since quitting: 12.4   Smokeless tobacco: Never   Tobacco comments:    Quit several yrs prior to 03/2013.  Vaping Use   Vaping status: Never Used  Substance and Sexual Activity   Alcohol use: Yes    Alcohol/week: 0.0 standard drinks of alcohol    Comment: Occasional   Drug use: No   Sexual activity: Not on file  Other Topics Concern   Not on file  Social History Narrative   Not on file   Social Drivers of Health   Financial Resource Strain: Not on file  Food Insecurity: No Food Insecurity (06/05/2022)   Hunger Vital Sign    Worried About Running Out of Food in the Last Year: Never true    Ran Out of Food in the Last Year: Never true  Transportation Needs: No Transportation Needs (06/05/2022)   PRAPARE - Administrator, Civil Service (Medical): No    Lack of Transportation (Non-Medical): No  Physical Activity: Not on file  Stress: Not on file  Social Connections: Not on file  Intimate Partner Violence: Not At Risk (06/05/2022)   Humiliation, Afraid, Rape, and Kick questionnaire    Fear of Current or Ex-Partner: No    Emotionally Abused: No    Physically Abused: No    Sexually Abused: No    Family History: Family History  Problem Relation Age of Onset   COPD Father     Current Medications:  Current Outpatient Medications:    amLODipine (NORVASC) 10 MG tablet, Take 1 tablet (10 mg total) by mouth daily., Disp: 90 tablet, Rfl: 3   aspirin EC 81 MG tablet, Take 81 mg by mouth daily., Disp: , Rfl:    bumetanide (BUMEX) 2 MG tablet, Take 1 tablet (2 mg total) by mouth daily as needed., Disp: , Rfl:    buPROPion (WELLBUTRIN XL) 300 MG 24 hr tablet, Take 300 mg by mouth daily., Disp: , Rfl:    cyclobenzaprine (FLEXERIL) 10 MG tablet, Take 10 mg by mouth at bedtime as needed for muscle spasms., Disp: , Rfl:    Durvalumab (IMFINZI IV), Inject into the vein every 21 ( twenty-one) days., Disp: , Rfl:    Eszopiclone 3 MG TABS, Take 3 mg by mouth at  bedtime. Take immediately before bedtime, Disp: , Rfl:    famotidine (PEPCID) 20 MG tablet, Take 20 mg by mouth at bedtime., Disp: , Rfl:    fenofibrate 160 MG tablet, Take 160 mg by mouth daily., Disp: , Rfl:    FLUoxetine (PROZAC) 10 MG capsule, Take 10 mg by mouth daily., Disp: , Rfl:    gabapentin (NEURONTIN) 300 MG  capsule, Take 300 mg by mouth every 6 (six) hours as needed., Disp: , Rfl:    magnesium oxide (MAG-OX) 400 (240 Mg) MG tablet, Take 1 tablet (400 mg total) by mouth 2 (two) times daily., Disp: 90 tablet, Rfl: 6   metFORMIN (GLUCOPHAGE-XR) 500 MG 24 hr tablet, Take 500 mg by mouth 2 (two) times daily., Disp: , Rfl:    metolazone (ZAROXOLYN) 5 MG tablet, Take 1 tablet (5 mg total) by mouth 3 (three) times a week., Disp: , Rfl:    nitroGLYCERIN (NITROSTAT) 0.4 MG SL tablet, Place 0.4 mg under the tongue every 5 (five) minutes x 3 doses as needed for chest pain (if no relief after 2nd dose, proceed to ED or call 911)., Disp: , Rfl:    olmesartan (BENICAR) 40 MG tablet, Take 40 mg by mouth daily., Disp: , Rfl:    omeprazole (PRILOSEC) 40 MG capsule, Take 40 mg by mouth daily., Disp: , Rfl:    pantoprazole (PROTONIX) 40 MG tablet, Take 1 tablet (40 mg total) by mouth daily., Disp: 90 tablet, Rfl: 0   potassium chloride SA (KLOR-CON M) 20 MEQ tablet, Take 1 tablet by mouth daily., Disp: , Rfl:    pramipexole (MIRAPEX) 0.75 MG tablet, Take 1 tablet (0.75 mg total) by mouth 3 (three) times daily., Disp: 90 tablet, Rfl: 0   prochlorperazine (COMPAZINE) 10 MG tablet, Take 1 tablet (10 mg total) by mouth every 6 (six) hours as needed for nausea or vomiting., Disp: 30 tablet, Rfl: 2   RYBELSUS 7 MG TABS, Take 14 mg by mouth daily., Disp: , Rfl:    tamsulosin (FLOMAX) 0.4 MG CAPS capsule, Take 0.4 mg by mouth daily., Disp: , Rfl:    traMADol (ULTRAM) 50 MG tablet, Take 50 mg by mouth 4 (four) times daily as needed for moderate pain., Disp: , Rfl:    zaleplon (SONATA) 10 MG capsule, Take 1 capsule  (10 mg total) by mouth at bedtime as needed for sleep., Disp: 30 capsule, Rfl: 5 No current facility-administered medications for this visit.  Facility-Administered Medications Ordered in Other Visits:    0.9 %  sodium chloride infusion, , Intravenous, Continuous, Doreatha Massed, MD, Stopped at 12/20/23 1312   magnesium sulfate 2 GM/50ML IVPB, , , ,    sodium chloride flush (NS) 0.9 % injection 10 mL, 10 mL, Intracatheter, PRN, Doreatha Massed, MD, 10 mL at 12/20/23 1311   Allergies: No Known Allergies  REVIEW OF SYSTEMS:   Review of Systems  Constitutional:  Positive for fatigue. Negative for chills and fever.  HENT:   Negative for lump/mass, mouth sores, nosebleeds, sore throat and trouble swallowing.   Eyes:  Negative for eye problems.  Respiratory:  Positive for shortness of breath. Negative for cough.   Cardiovascular:  Negative for chest pain, leg swelling and palpitations.  Gastrointestinal:  Positive for constipation. Negative for abdominal pain, diarrhea, nausea and vomiting.  Genitourinary:  Negative for bladder incontinence, difficulty urinating, dysuria, frequency, hematuria and nocturia.   Musculoskeletal:  Negative for arthralgias, back pain, flank pain, myalgias and neck pain.       +left hip pain, 10/10 severity +muscle tightness throughout body  Skin:  Negative for itching and rash.  Neurological:  Positive for numbness (in feet). Negative for dizziness and headaches.  Hematological:  Does not bruise/bleed easily.  Psychiatric/Behavioral:  Positive for depression and sleep disturbance. Negative for suicidal ideas. The patient is nervous/anxious.   All other systems reviewed and are negative.  VITALS:   There were no vitals taken for this visit.  Wt Readings from Last 3 Encounters:  12/20/23 294 lb 12.1 oz (133.7 kg)  12/06/23 289 lb 14.4 oz (131.5 kg)  11/07/23 297 lb (134.7 kg)    There is no height or weight on file to calculate  BMI.  Performance status (ECOG): 1 - Symptomatic but completely ambulatory  PHYSICAL EXAM:   Physical Exam Vitals and nursing note reviewed. Exam conducted with a chaperone present.  Constitutional:      Appearance: Normal appearance.  Cardiovascular:     Rate and Rhythm: Normal rate and regular rhythm.     Pulses: Normal pulses.     Heart sounds: Normal heart sounds.  Pulmonary:     Effort: Pulmonary effort is normal.     Breath sounds: Normal breath sounds.  Abdominal:     Palpations: Abdomen is soft. There is no hepatomegaly, splenomegaly or mass.     Tenderness: There is no abdominal tenderness.  Musculoskeletal:     Right lower leg: No edema.     Left lower leg: No edema.  Lymphadenopathy:     Cervical: No cervical adenopathy.     Right cervical: No superficial, deep or posterior cervical adenopathy.    Left cervical: No superficial, deep or posterior cervical adenopathy.     Upper Body:     Right upper body: No supraclavicular or axillary adenopathy.     Left upper body: No supraclavicular or axillary adenopathy.  Neurological:     General: No focal deficit present.     Mental Status: He is alert and oriented to person, place, and time.  Psychiatric:        Mood and Affect: Mood normal.        Behavior: Behavior normal.     LABS:      Latest Ref Rng & Units 12/20/2023    7:56 AM 12/06/2023    1:17 PM 11/07/2023    8:24 AM  CBC  WBC 4.0 - 10.5 K/uL 5.5  8.9  5.2   Hemoglobin 13.0 - 17.0 g/dL 40.9  81.1  91.4   Hematocrit 39.0 - 52.0 % 39.5  43.4  40.0   Platelets 150 - 400 K/uL 231  347  279       Latest Ref Rng & Units 12/20/2023    7:56 AM 12/06/2023    1:17 PM 11/07/2023    8:24 AM  CMP  Glucose 70 - 99 mg/dL 782  956  213   BUN 8 - 23 mg/dL 35  32  16   Creatinine 0.61 - 1.24 mg/dL 0.86  5.78  4.69   Sodium 135 - 145 mmol/L 133  135  138   Potassium 3.5 - 5.1 mmol/L 3.8  4.0  3.8   Chloride 98 - 111 mmol/L 98  98  101   CO2 22 - 32 mmol/L 24  24  24     Calcium 8.9 - 10.3 mg/dL 9.1  9.9  9.3   Total Protein 6.5 - 8.1 g/dL 6.3  7.6  6.3   Total Bilirubin 0.0 - 1.2 mg/dL 0.4  0.4  0.4   Alkaline Phos 38 - 126 U/L 88  73  77   AST 15 - 41 U/L 22  19  21    ALT 0 - 44 U/L 35  18  23      Lab Results  Component Value Date   CEA1 3.4 10/11/2023   /  CEA  Date Value Ref Range Status  10/11/2023 3.4 0.0 - 4.7 ng/mL Final    Comment:    (NOTE)                             Nonsmokers          <3.9                             Smokers             <5.6 [employer] Electrochemiluminescence Immunoassay (ECLIA) Values obtained with different assay methods or kits cannot be used interchangeably.  Results cannot be interpreted as absolute evidence of the presence or absence of malignant disease. Performed At: [facility] [address] [place], [place] [id number] Jolene Schimke MD WU:[id number]    No results found for: "PSA1" Lab Results  Component Value Date   CAN199 41 (H) 12/06/2023   No results found for: "CAN125"  No results found for: "TOTALPROTELP", "ALBUMINELP", "A1GS", "A2GS", "BETS", "BETA2SER", "GAMS", "MSPIKE", "SPEI" Lab Results  Component Value Date   TIBC 428 01/29/2023   TIBC 457 (H) 01/04/2022   FERRITIN 183 01/29/2023   FERRITIN 522 (H) 01/04/2022   IRONPCTSAT 15 01/29/2023   IRONPCTSAT 16 (L) 01/04/2022   Lab Results  Component Value Date   LDH 266 (H) 10/05/2021   LDH 242 (H) 09/01/2021     STUDIES:   CT CHEST ABDOMEN PELVIS W CONTRAST Result Date: 11/29/2023 CLINICAL DATA:  Metastatic cholangiocarcinoma, follow-up. * Tracking Code: BO * EXAM: CT CHEST, ABDOMEN, AND PELVIS WITH CONTRAST TECHNIQUE: Multidetector CT imaging of the chest, abdomen and pelvis was performed following the standard protocol during bolus administration of intravenous contrast. RADIATION DOSE REDUCTION: This exam was performed according to the departmental dose-optimization program which includes automated exposure  control, adjustment of the mA and/or kV according to patient size and/or use of iterative reconstruction technique. CONTRAST:  OMNIPAQUE IOHEXOL 300 MG/ML  SOLN COMPARISON:  Multiple priors including CT October 02, 2023 FINDINGS: CT CHEST FINDINGS Cardiovascular: Right chest Port-A-Cath with tip near the superior cavoatrial junction. Aortic atherosclerosis. Normal size heart. Scattered coronary artery calcifications. Mediastinum/Nodes: No suspicious thyroid nodule. No pathologically enlarged mediastinal, hilar or axillary lymph nodes. The esophagus is grossly unremarkable. Lungs/Pleura: No suspicious pulmonary nodules or masses. Similar mild subpleural reticulations. Musculoskeletal: At the no aggressive lytic or blastic lesion of bone. CT ABDOMEN PELVIS FINDINGS Hepatobiliary: Diffuse hepatic steatosis. Similar contour nodularity of the liver. Gallbladder wall thickening with adjacent stranding similar to prior. Cholelithiasis. Gallbladder is nondilated. No biliary ductal dilation. Pancreas: No pancreatic ductal dilation or evidence of acute inflammation. Spleen: No splenomegaly. Adrenals/Urinary Tract: No suspicious adrenal nodule/mass. No hydronephrosis. Kidneys demonstrate symmetric enhancement. Urinary bladder is unremarkable for degree of distension. Stomach/Bowel: Stomach is unremarkable for degree of distension. No pathologic dilation of small or large bowel. Colonic diverticulosis. Vascular/Lymphatic: Aortic atherosclerosis. Smooth IVC contours. The portal, splenic and superior mesenteric veins are patent. No pathologically enlarged abdominal or pelvic lymph nodes. Reproductive: Prostate is unremarkable. Other: Increased burden of peritoneal/mesenteric nodularity for instance in the right lower quadrant adjacent to the cecum/appendix on image 87/2 anterior to the splenic flexure on image 82/2 along the anterior omentum on image 93/2 and anterior to the spleen on image 58/2. New trace perihepatic fluid  for instance on image 66/2. Slightly increased mesenteric edema. Musculoskeletal: No aggressive lytic or blastic lesion of bone.  Multilevel degenerative changes spine. Right total hip arthroplasty. Degenerative change of the left hip. IMPRESSION: 1. Increased burden of peritoneal/mesenteric nodularity and new trace perihepatic fluid and increased mesenteric edema, concerning for worsening peritoneal carcinomatosis. 2. Similar gallbladder wall thickening and adjacent stranding, suggest correlation with right upper quadrant tenderness to palpation. 3. Diffuse hepatic steatosis. 4. Aortic atherosclerosis. Electronically Signed   By: Maudry Mayhew M.D.   On: 11/29/2023 17:03

## 2023-12-20 NOTE — Progress Notes (Signed)
 Patient has been examined by Dr. Ellin Saba. Vital signs and labs have been reviewed by MD - ANC, Creatinine, LFTs, hemoglobin, and platelets are within treatment parameters per M.D. - pt may proceed with treatment.  Primary RN and pharmacy notified.

## 2023-12-20 NOTE — Patient Instructions (Signed)
 [facility] [place] - A DEPT OF [facility]  Discharge Instructions: Thank you for choosing [facility] to provide your oncology and hematology care.  If you have a lab appointment with the Cancer Center - please note that after April 8th, 2024, all labs will be drawn in the cancer center.  You do not have to check in or register with the main entrance as you have in the past but will complete your check-in in the cancer center.  Wear comfortable clothing and clothing appropriate for easy access to any Portacath or PICC line.   We strive to give you quality time with your provider. You may need to reschedule your appointment if you arrive late (15 or more minutes).  Arriving late affects you and other patients whose appointments are after yours.  Also, if you miss three or more appointments without notifying the office, you may be dismissed from the clinic at the provider's discretion.      For prescription refill requests, have your pharmacy contact our office and allow 72 hours for refills to be completed.    Today you received the following chemotherapy and/or immunotherapy agents Gemzar/Cisplatin   To help prevent nausea and vomiting after your treatment, we encourage you to take your nausea medication as directed.  Gemcitabine Injection What is this medication? GEMCITABINE (jem SYE ta been) treats some types of cancer. It works by slowing down the growth of cancer cells. This medicine may be used for other purposes; ask your health care provider or pharmacist if you have questions. COMMON BRAND NAME(S): Gemzar, Infugem What should I tell my care team before I take this medication? They need to know if you have any of these conditions: Blood disorders Infection Kidney disease Liver disease Lung or breathing disease, such as asthma or COPD Recent or ongoing radiation therapy An unusual or allergic reaction to gemcitabine, other medications, foods,  dyes, or preservatives If you or your partner are pregnant or trying to get pregnant Breast-feeding How should I use this medication? This medication is injected into a vein. It is given by your care team in a hospital or clinic setting. Talk to your care team about the use of this medication in children. Special care may be needed. Overdosage: If you think you have taken too much of this medicine contact a poison control center or emergency room at once. NOTE: This medicine is only for you. Do not share this medicine with others. What if I miss a dose? Keep appointments for follow-up doses. It is important not to miss your dose. Call your care team if you are unable to keep an appointment. What may interact with this medication? Interactions have not been studied. This list may not describe all possible interactions. Give your health care provider a list of all the medicines, herbs, non-prescription drugs, or dietary supplements you use. Also tell them if you smoke, drink alcohol, or use illegal drugs. Some items may interact with your medicine. What should I watch for while using this medication? Your condition will be monitored carefully while you are receiving this medication. This medication may make you feel generally unwell. This is not uncommon, as chemotherapy can affect healthy cells as well as cancer cells. Report any side effects. Continue your course of treatment even though you feel ill unless your care team tells you to stop. In some cases, you may be given additional medications to help with side effects. Follow all directions  for their use. This medication may increase your risk of getting an infection. Call your care team for advice if you get a fever, chills, sore throat, or other symptoms of a cold or flu. Do not treat yourself. Try to avoid being around people who are sick. This medication may increase your risk to bruise or bleed. Call your care team if you notice any unusual  bleeding. Be careful brushing or flossing your teeth or using a toothpick because you may get an infection or bleed more easily. If you have any dental work done, tell your dentist you are receiving this medication. Avoid taking medications that contain aspirin, acetaminophen, ibuprofen, naproxen, or ketoprofen unless instructed by your care team. These medications may hide a fever. Talk to your care team if you or your partner wish to become pregnant or think you might be pregnant. This medication can cause serious birth defects if taken during pregnancy and for 6 months after the last dose. A negative pregnancy test is required before starting this medication. A reliable form of contraception is recommended while taking this medication and for 6 months after the last dose. Talk to your care team about effective forms of contraception. Do not father a child while taking this medication and for 3 months after the last dose. Use a condom while having sex during this time period. Do not breastfeed while taking this medication and for at least 1 week after the last dose. This medication may cause infertility. Talk to your care team if you are concerned about your fertility. What side effects may I notice from receiving this medication? Side effects that you should report to your care team as soon as possible: Allergic reactions--skin rash, itching, hives, swelling of the face, lips, tongue, or throat Capillary leak syndrome--stomach or muscle pain, unusual weakness or fatigue, feeling faint or lightheaded, decrease in the amount of urine, swelling of the ankles, hands, or feet, trouble breathing Infection--fever, chills, cough, sore throat, wounds that don't heal, pain or trouble when passing urine, general feeling of discomfort or being unwell Liver injury--right upper belly pain, loss of appetite, nausea, light-colored stool, dark yellow or brown urine, yellowing skin or eyes, unusual weakness or  fatigue Low red blood cell level--unusual weakness or fatigue, dizziness, headache, trouble breathing Lung injury--shortness of breath or trouble breathing, cough, spitting up blood, chest pain, fever Stomach pain, bloody diarrhea, pale skin, unusual weakness or fatigue, decrease in the amount of urine, which may be signs of hemolytic uremic syndrome Sudden and severe headache, confusion, change in vision, seizures, which may be signs of posterior reversible encephalopathy syndrome (PRES) Unusual bruising or bleeding Side effects that usually do not require medical attention (report to your care team if they continue or are bothersome): Diarrhea Drowsiness Hair loss Nausea Pain, redness, or swelling with sores inside the mouth or throat Vomiting This list may not describe all possible side effects. Call your doctor for medical advice about side effects. You may report side effects to FDA at 1-800-FDA-1088. Where should I keep my medication? This medication is given in a hospital or clinic. It will not be stored at home. NOTE: This sheet is a summary. It may not cover all possible information. If you have questions about this medicine, talk to your doctor, pharmacist, or health care provider.  2024 Elsevier/Gold Standard (2022-01-03 00:00:00)  BELOW ARE SYMPTOMS THAT SHOULD BE REPORTED IMMEDIATELY: *FEVER GREATER THAN 100.4 F (38 C) OR HIGHER *CHILLS OR SWEATING *NAUSEA AND VOMITING THAT IS  NOT CONTROLLED WITH YOUR NAUSEA MEDICATION *UNUSUAL SHORTNESS OF BREATH *UNUSUAL BRUISING OR BLEEDING *URINARY PROBLEMS (pain or burning when urinating, or frequent urination) *BOWEL PROBLEMS (unusual diarrhea, constipation, pain near the anus) TENDERNESS IN MOUTH AND THROAT WITH OR WITHOUT PRESENCE OF ULCERS (sore throat, sores in mouth, or a toothache) UNUSUAL RASH, SWELLING OR PAIN  UNUSUAL VAGINAL DISCHARGE OR ITCHING   Items with * indicate a potential emergency and should be followed up as  soon as possible or go to the Emergency Department if any problems should occur.  Please show the CHEMOTHERAPY ALERT CARD or IMMUNOTHERAPY ALERT CARD at check-in to the Emergency Department and triage nurse.  Should you have questions after your visit or need to cancel or reschedule your appointment, please contact [facility] [place] - A DEPT OF Eligha Bridegroom [facility] [phone]  and follow the prompts.  Office hours are 8:00 a.m. to 4:30 p.m. Monday - Friday. Please note that voicemails left after 4:00 p.m. may not be returned until the following business day.  We are closed weekends and major holidays. You have access to a nurse at all times for urgent questions. Please call the main number to the clinic [phone] and follow the prompts.  For any non-urgent questions, you may also contact your provider using MyChart. We now offer e-Visits for anyone 33 and older to request care online for non-urgent symptoms. For details visit [email or web address].   Also download the MyChart app! Go to the app store, search "MyChart", open the app, select [place], and log in with your MyChart username and password.   Cisplatin Injection What is this medication? CISPLATIN (SIS pla tin) treats some types of cancer. It works by slowing down the growth of cancer cells. This medicine may be used for other purposes; ask your health care provider or pharmacist if you have questions. COMMON BRAND NAME(S): Platinol, Platinol -AQ What should I tell my care team before I take this medication? They need to know if you have any of these conditions: Eye disease, vision problems Hearing problems Kidney disease Low blood counts, such as low white cells, platelets, or red blood cells Tingling of the fingers or toes, or other nerve disorder An unusual or allergic reaction to cisplatin, carboplatin, oxaliplatin, other medications, foods, dyes, or preservatives If you or your partner are pregnant or  trying to get pregnant Breast-feeding How should I use this medication? This medication is injected into a vein. It is given by your care team in a hospital or clinic setting. Talk to your care team about the use of this medication in children. Special care may be needed. Overdosage: If you think you have taken too much of this medicine contact a poison control center or emergency room at once. NOTE: This medicine is only for you. Do not share this medicine with others. What if I miss a dose? Keep appointments for follow-up doses. It is important not to miss your dose. Call your care team if you are unable to keep an appointment. What may interact with this medication? Do not take this medication with any of the following: Live virus vaccines This medication may also interact with the following: Certain antibiotics, such as amikacin, gentamicin, neomycin, polymyxin B, streptomycin, tobramycin, vancomycin Foscarnet This list may not describe all possible interactions. Give your health care provider a list of all the medicines, herbs, non-prescription drugs, or dietary supplements you use. Also tell them if you smoke, drink alcohol, or use illegal drugs.  Some items may interact with your medicine. What should I watch for while using this medication? Your condition will be monitored carefully while you are receiving this medication. You may need blood work done while taking this medication. This medication may make you feel generally unwell. This is not uncommon, as chemotherapy can affect healthy cells as well as cancer cells. Report any side effects. Continue your course of treatment even though you feel ill unless your care team tells you to stop. This medication may increase your risk of getting an infection. Call your care team for advice if you get a fever, chills, sore throat, or other symptoms of a cold or flu. Do not treat yourself. Try to avoid being around people who are sick. Avoid  taking medications that contain aspirin, acetaminophen, ibuprofen, naproxen, or ketoprofen unless instructed by your care team. These medications may hide a fever. This medication may increase your risk to bruise or bleed. Call your care team if you notice any unusual bleeding. Be careful brushing or flossing your teeth or using a toothpick because you may get an infection or bleed more easily. If you have any dental work done, tell your dentist you are receiving this medication. Drink fluids as directed while you are taking this medication. This will help protect your kidneys. Call your care team if you get diarrhea. Do not treat yourself. Talk to your care team if you or your partner wish to become pregnant or think you might be pregnant. This medication can cause serious birth defects if taken during pregnancy and for 14 months after the last dose. A negative pregnancy test is required before starting this medication. A reliable form of contraception is recommended while taking this medication and for 14 months after the last dose. Talk to your care team about effective forms of contraception. Do not father a child while taking this medication and for 11 months after the last dose. Use a condom during sex during this time period. Do not breast-feed while taking this medication. This medication may cause infertility. Talk to your care team if you are concerned about your fertility. What side effects may I notice from receiving this medication? Side effects that you should report to your care team as soon as possible: Allergic reactions--skin rash, itching, hives, swelling of the face, lips, tongue, or throat Eye pain, change in vision, vision loss Hearing loss, ringing in ears Infection--fever, chills, cough, sore throat, wounds that don't heal, pain or trouble when passing urine, general feeling of discomfort or being unwell Kidney injury--decrease in the amount of urine, swelling of the ankles,  hands, or feet Low red blood cell level--unusual weakness or fatigue, dizziness, headache, trouble breathing Painful swelling, warmth, or redness of the skin, blisters or sores at the infusion site Pain, tingling, or numbness in the hands or feet Unusual bruising or bleeding Side effects that usually do not require medical attention (report to your care team if they continue or are bothersome): Hair loss Nausea Vomiting This list may not describe all possible side effects. Call your doctor for medical advice about side effects. You may report side effects to FDA at 1-800-FDA-1088. Where should I keep my medication? This medication is given in a hospital or clinic. It will not be stored at home. NOTE: This sheet is a summary. It may not cover all possible information. If you have questions about this medicine, talk to your doctor, pharmacist, or health care provider.  2024 Elsevier/Gold Standard (2021-12-30 00:00:00)

## 2023-12-20 NOTE — Patient Instructions (Signed)

## 2023-12-28 ENCOUNTER — Ambulatory Visit: Attending: Nurse Practitioner

## 2023-12-28 DIAGNOSIS — Z01810 Encounter for preprocedural cardiovascular examination: Secondary | ICD-10-CM | POA: Diagnosis present

## 2023-12-28 NOTE — Progress Notes (Signed)
 Virtual Visit via Telephone Note   Because of Marvin Carr co-morbid illnesses, he is at least at moderate risk for complications without adequate follow up.  This format is felt to be most appropriate for this patient at this time.  Due to technical limitations with video connection (technology), today's appointment will be conducted as an audio only telehealth visit, and Kamoni Gentles verbally agreed to proceed in this manner.   All issues noted in this document were discussed and addressed.  No physical exam could be performed with this format.  Evaluation Performed:  Preoperative cardiovascular risk assessment _____________   Date:  12/28/2023   Patient ID:  Marvin Carr, DOB 1956/05/01, MRN [id number] Patient Location:  Home Provider location:   Office  Primary Care Provider:  [name], MD Primary Cardiologist:  Jayson Sierras, MD  Chief Complaint / Patient Profile   68 y.o. y/o male with a h/o nonobstructive CAD, HTN, HLD, DM type II, OSA (on CPAP), cholangiocarcinoma with peritoneal carcinomatosis who is pending left total hip arthroplasty and presents today for telephonic preoperative cardiovascular risk assessment.  History of Present Illness    Marvin Carr is a 68 y.o. male who presents via audio/video conferencing for a telehealth visit today.  Pt was last seen in cardiology clinic on 10/09/2023 by Laymon Qua, PA.  At that time Wiatt Mahabir was doing well with no complaint of chest pain or palpitations.  Patient is also currently being followed by [facility] for treatment of peritoneal carcinomatosis.  The patient is now pending procedure as outlined above. Since his last visit, he reports doing well from a cardiac perspective with no changes since his previous visit.  He is compliant with his medications and denies any adverse reactions.  He is able to complete 4 METS of activity but is limited with hip pain but no cardiac  limitations.  He denies chest pain, shortness of breath, lower extremity edema, fatigue, palpitations, melena, hematuria, hemoptysis, diaphoresis, weakness, presyncope, syncope, orthopnea, and PND.    Past Medical History    Past Medical History:  Diagnosis Date   Anxiety    Bladder cancer (HCC) 09/11/2009   CAD (coronary artery disease), native coronary artery    a. Mildly elevated troponin 03/2013, cath with nonobstructive disease including 50% AV groove distal stenosis before large OM   Essential hypertension    Headache(784.0)    History of migraines   Hyperglycemia    Mixed hyperlipidemia    Neuropathy    Obesity    Port-A-Cath in place 09/30/2021   Pre-diabetes    Seasonal allergies    Sleep apnea    On CPAP   Past Surgical History:  Procedure Laterality Date   BIOPSY  09/23/2021   Procedure: BIOPSY;  Surgeon: Eartha Angelia Sieving, MD;  [facility];  Service: Gastroenterology;;   COLONOSCOPY  06/28/2011   Procedure: COLONOSCOPY;  Surgeon: Claudis RAYMOND Rivet, MD;  Location: [facility];  Service: Endoscopy;  Laterality: N/A;   COLONOSCOPY WITH PROPOFOL  N/A 09/23/2021   Procedure: COLONOSCOPY WITH PROPOFOL ;  Surgeon: Eartha Angelia Sieving, MD;  Location: [facility];  Service: Gastroenterology;  Laterality: N/A;  940   ESOPHAGOGASTRODUODENOSCOPY (EGD) WITH PROPOFOL  N/A 09/23/2021   Procedure: ESOPHAGOGASTRODUODENOSCOPY (EGD) WITH PROPOFOL ;  Surgeon: Eartha Angelia Sieving, MD;  Location: [facility];  Service: Gastroenterology;  Laterality: N/A;   IR IMAGING GUIDED PORT INSERTION  09/28/2021   IR PARACENTESIS  09/28/2021   JOINT REPLACEMENT Right  hip   LEFT HEART CATHETERIZATION WITH CORONARY ANGIOGRAM N/A 03/31/2013   Procedure: LEFT HEART CATHETERIZATION WITH CORONARY ANGIOGRAM;  Surgeon: Lynwood Schilling, MD;  [facility] LAB;  Service: Cardiovascular;  Laterality: N/A;   POLYPECTOMY  09/23/2021   Procedure: POLYPECTOMY;  Surgeon: Eartha Angelia Sieving, MD;  Location: [facility];  Service: Gastroenterology;;   TURBT  09/11/2009    Allergies  No Known Allergies  Home Medications    Prior to Admission medications   Medication Sig Start Date End Date Taking? Authorizing Provider  amLODipine  (NORVASC ) 10 MG tablet Take 1 tablet (10 mg total) by mouth daily. 10/09/23   Strader, Laymon HERO, PA-C  aspirin  EC 81 MG tablet Take 81 mg by mouth daily.    [provider]  bumetanide  (BUMEX ) 2 MG tablet Take 1 tablet (2 mg total) by mouth daily as needed. Patient taking differently: Take 2 mg by mouth daily. 10/09/23   Strader, Laymon HERO, PA-C  buPROPion  (WELLBUTRIN  XL) 300 MG 24 hr tablet Take 300 mg by mouth daily. 09/17/23   [provider]  cetirizine  (ZYRTEC ) 10 MG tablet Take 10 mg by mouth daily.    [provider]  cyclobenzaprine  (FLEXERIL ) 10 MG tablet Take 10 mg by mouth at bedtime as needed for muscle spasms.    [provider]  docusate sodium  (COLACE) 100 MG capsule Take 100-200 mg by mouth at bedtime as needed for mild constipation.    [provider]  Durvalumab  (IMFINZI  IV) Inject into the vein every 21 ( twenty-one) days. 10/05/21   [provider]  Eszopiclone 3 MG TABS Take 3 mg by mouth at bedtime.    [provider]  famotidine  (PEPCID ) 20 MG tablet Take 20 mg by mouth at bedtime. 09/17/23   [provider]  fenofibrate  160 MG tablet Take 160 mg by mouth daily. 11/05/19   [provider]  FLUoxetine  (PROZAC ) 10 MG capsule Take 10 mg by mouth daily. 09/14/23   [provider]  gabapentin  (NEURONTIN ) 300 MG capsule Take 300 mg by mouth every 6 (six) hours as needed (pain). 09/10/23   [provider]  guaiFENesin (MUCINEX) 600 MG 12 hr tablet Take 600 mg by mouth 2 (two) times daily as needed for cough or to loosen phlegm.    [provider]  magnesium  oxide (MAG-OX) 400 (240 Mg) MG tablet Take 1 tablet (400 mg  total) by mouth 2 (two) times daily. 11/16/21   Rogers Hai, MD  metFORMIN  (GLUCOPHAGE -XR) 500 MG 24 hr tablet Take 500 mg by mouth 2 (two) times daily. 08/16/21   [provider]  Multiple Vitamin (MULTIVITAMIN WITH MINERALS) TABS tablet Take 1 tablet by mouth daily.    [provider]  nitroGLYCERIN  (NITROSTAT ) 0.4 MG SL tablet Place 0.4 mg under the tongue every 5 (five) minutes x 3 doses as needed for chest pain (if no relief after 2nd dose, proceed to ED or call 911).    [provider]  NON FORMULARY Pt uses a cpap    [provider]  olmesartan  (BENICAR ) 40 MG tablet Take 40 mg by mouth daily.    [provider]  pantoprazole  (PROTONIX ) 40 MG tablet Take 1 tablet (40 mg total) by mouth daily. Patient not taking: Reported on 12/27/2023 03/01/23   Rogers Hai, MD  pramipexole  (MIRAPEX ) 0.75 MG tablet Take 1 tablet (0.75 mg total) by mouth 3 (three) times daily. 08/30/22   Rogers Hai, MD  prochlorperazine  (COMPAZINE )  10 MG tablet Take 1 tablet (10 mg total) by mouth every 6 (six) hours as needed for nausea or vomiting. 12/07/23   Rogers Hai, MD  Saw Palmetto, Serenoa repens, (SAW PALMETTO PO) Take 1 capsule by mouth daily.    [provider]  Semaglutide  14 MG TABS Take 14 mg by mouth daily. 08/16/21   [provider]  tamsulosin  (FLOMAX ) 0.4 MG CAPS capsule Take 0.4 mg by mouth daily. 08/29/21   [provider]  traMADol  (ULTRAM ) 50 MG tablet Take 50 mg by mouth 4 (four) times daily as needed for moderate pain. 10/13/21   [provider]  zaleplon  (SONATA ) 10 MG capsule Take 1 capsule (10 mg total) by mouth at bedtime as needed for sleep. 08/30/23   Rogers Hai, MD    Physical Exam    Vital Signs:  Marvin Carr does not have vital signs available for review today.  Given telephonic nature of communication, physical exam is limited. AAOx3. NAD. Normal affect.  Speech and  respirations are unlabored.  Accessory Clinical Findings    None  Assessment & Plan    1.  Preoperative Cardiovascular Risk Assessment: - Patient's RCRI score is 0.9%  The patient affirms he has been doing well without any new cardiac symptoms. They are able to achieve 5 METS without cardiac limitations. Therefore, based on ACC/AHA guidelines, the patient would be at acceptable risk for the planned procedure without further cardiovascular testing. The patient was advised that if he develops new symptoms prior to surgery to contact our office to arrange for a follow-up visit, and he verbalized understanding.   The patient was advised that if he develops new symptoms prior to surgery to contact our office to arrange for a follow-up visit, and he verbalized understanding.  Patient may hold Aspirin  for 5-7 days prior to procedure and should restart postprocedure when surgically safe and hemostasis is achieved.  A copy of this note will be routed to requesting surgeon.  Time:   Today, I have spent 6 minutes with the patient with telehealth technology discussing medical history, symptoms, and management plan.     Wyn Raddle, Jackee Shove, NP  12/28/2023, 7:07 AM

## 2023-12-31 ENCOUNTER — Other Ambulatory Visit: Payer: Self-pay | Admitting: Hematology

## 2023-12-31 NOTE — Telephone Encounter (Signed)
 Sent to us  for refill

## 2024-01-01 NOTE — Patient Instructions (Signed)
 DUE TO COVID-19 ONLY TWO VISITORS  (aged 69 and older)  ARE ALLOWED TO COME WITH YOU AND STAY IN THE WAITING ROOM ONLY DURING PRE OP AND PROCEDURE.   **NO VISITORS ARE ALLOWED IN THE SHORT STAY AREA OR RECOVERY ROOM!!**  IF YOU WILL BE ADMITTED INTO THE HOSPITAL YOU ARE ALLOWED ONLY FOUR SUPPORT PEOPLE DURING VISITATION HOURS ONLY (7 AM -8PM)   The support person(s) must pass our screening, gel in and out, and wear a mask at all times, including in the patient's room. Patients must also wear a mask when staff or their support person are in the room. Visitors GUEST BADGE MUST BE WORN VISIBLY  One adult visitor may remain with you overnight and MUST be in the room by 8 P.M.     Your procedure is scheduled on: 01/15/24   Report to [facility] Main Entrance    Report to admitting at : 6:00 AM   Call this number if you have problems the morning of surgery [phone]   Do not eat food :After Midnight.   After Midnight you may have the following liquids until : 5:30 AM DAY OF SURGERY  Water Black Coffee (sugar ok, NO MILK/CREAM OR CREAMERS)  Tea (sugar ok, NO MILK/CREAM OR CREAMERS) regular and decaf                             Plain Jell-O (NO RED)                                           Fruit ices (not with fruit pulp, NO RED)                                     Popsicles (NO RED)                                                                  Juice: apple, WHITE grape, WHITE cranberry Sports drinks like Gatorade (NO RED)   The day of surgery:  Drink ONE (1) Pre-Surgery Clear G2 at : 5:30 AM the morning of surgery. Drink in one sitting. Do not sip.  This drink was given to you during your hospital  pre-op appointment visit. Nothing else to drink after completing the  Pre-Surgery Clear Ensure or G2.          If you have questions, please contact your surgeon's office.  FOLLOW ANY ADDITIONAL PRE OP INSTRUCTIONS YOU RECEIVED FROM YOUR SURGEON'S OFFICE!!!   Oral Hygiene  is also important to reduce your risk of infection.                                    Remember - BRUSH YOUR TEETH THE MORNING OF SURGERY WITH YOUR REGULAR TOOTHPASTE  DENTURES WILL BE REMOVED PRIOR TO SURGERY PLEASE DO NOT APPLY "Poly grip" OR ADHESIVES!!!   Do NOT smoke after Midnight   Take these medicines the morning of surgery  with A SIP OF WATER: bupropion ,fluoxetine ,cetirizine,Mirapex ,amlodipine ,Flomax .Gabapentin  as needed.  DO NOT TAKE ANY ORAL DIABETIC MEDICATIONS DAY OF YOUR SURGERY  HOLD Semaglutide after: 01/07/24  Bring CPAP mask and tubing day of surgery.                              You may not have any metal on your body including hair pins, jewelry, and body piercing             Do not wear lotions, powders, perfumes/cologne, or deodorant              Men may shave face and neck.   Do not bring valuables to the hospital. [facility] IS NOT             RESPONSIBLE   FOR VALUABLES.   Contacts, glasses, or bridgework may not be worn into surgery.   Bring small overnight bag day of surgery.   DO NOT BRING YOUR HOME MEDICATIONS TO THE HOSPITAL. PHARMACY WILL DISPENSE MEDICATIONS LISTED ON YOUR MEDICATION LIST TO YOU DURING YOUR ADMISSION IN THE HOSPITAL!    Patients discharged on the day of surgery will not be allowed to drive home.  Someone NEEDS to stay with you for the first 24 hours after anesthesia.   Special Instructions: Bring a copy of your healthcare power of attorney and living will documents         the day of surgery if you haven't scanned them before.              Please read over the following fact sheets you were given: IF YOU HAVE QUESTIONS ABOUT YOUR PRE-OP INSTRUCTIONS PLEASE CALL [phone]      Pre-operative 5 CHG Bath Instructions   You can play a key role in reducing the risk of infection after surgery. Your skin needs to be as free of germs as possible. You can reduce the number of germs on your skin by washing with CHG (chlorhexidine  gluconate) soap before surgery. CHG is an antiseptic soap that kills germs and continues to kill germs even after washing.   DO NOT use if you have an allergy to chlorhexidine/CHG or antibacterial soaps. If your skin becomes reddened or irritated, stop using the CHG and notify one of our RNs at [phone].   Please shower with the CHG soap starting 4 days before surgery using the following schedule:     Please keep in mind the following:  DO NOT shave, including legs and underarms, starting the day of your first shower.   You may shave your face at any point before/day of surgery.  Place clean sheets on your bed the day you start using CHG soap. Use a clean washcloth (not used since being washed) for each shower. DO NOT sleep with pets once you start using the CHG.   CHG Shower Instructions:  If you choose to wash your hair and private area, wash first with your normal shampoo/soap.  After you use shampoo/soap, rinse your hair and body thoroughly to remove shampoo/soap residue.  Turn the water OFF and apply about 3 tablespoons (45 ml) of CHG soap to a CLEAN washcloth.  Apply CHG soap ONLY FROM YOUR NECK DOWN TO YOUR TOES (washing for 3-5 minutes)  DO NOT use CHG soap on face, private areas, open wounds, or sores.  Pay special attention to the area where your surgery is being performed.  If you are having  back surgery, having someone wash your back for you may be helpful. Wait 2 minutes after CHG soap is applied, then you may rinse off the CHG soap.  Pat dry with a clean towel  Put on clean clothes/pajamas   If you choose to wear lotion, please use ONLY the CHG-compatible lotions on the back of this paper.     Additional instructions for the day of surgery: DO NOT APPLY any lotions, deodorants, cologne, or perfumes.   Put on clean/comfortable clothes.  Brush your teeth.  Ask your nurse before applying any prescription medications to the skin.   CHG Compatible Lotions   Aveeno  Moisturizing lotion  Cetaphil Moisturizing Cream  Cetaphil Moisturizing Lotion  Clairol Herbal Essence Moisturizing Lotion, Dry Skin  Clairol Herbal Essence Moisturizing Lotion, Extra Dry Skin  Clairol Herbal Essence Moisturizing Lotion, Normal Skin  Curel Age Defying Therapeutic Moisturizing Lotion with Alpha Hydroxy  Curel Extreme Care Body Lotion  Curel Soothing Hands Moisturizing Hand Lotion  Curel Therapeutic Moisturizing Cream, Fragrance-Free  Curel Therapeutic Moisturizing Lotion, Fragrance-Free  Curel Therapeutic Moisturizing Lotion, Original Formula  Eucerin Daily Replenishing Lotion  Eucerin Dry Skin Therapy Plus Alpha Hydroxy Crme  Eucerin Dry Skin Therapy Plus Alpha Hydroxy Lotion  Eucerin Original Crme  Eucerin Original Lotion  Eucerin Plus Crme Eucerin Plus Lotion  Eucerin TriLipid Replenishing Lotion  Keri Anti-Bacterial Hand Lotion  Keri Deep Conditioning Original Lotion Dry Skin Formula Softly Scented  Keri Deep Conditioning Original Lotion, Fragrance Free Sensitive Skin Formula  Keri Lotion Fast Absorbing Fragrance Free Sensitive Skin Formula  Keri Lotion Fast Absorbing Softly Scented Dry Skin Formula  Keri Original Lotion  Keri Skin Renewal Lotion Keri Silky Smooth Lotion  Keri Silky Smooth Sensitive Skin Lotion  Nivea Body Creamy Conditioning Oil  Nivea Body Extra Enriched Lotion  Nivea Body Original Lotion  Nivea Body Sheer Moisturizing Lotion Nivea Crme  Nivea Skin Firming Lotion  NutraDerm 30 Skin Lotion  NutraDerm Skin Lotion  NutraDerm Therapeutic Skin Cream  NutraDerm Therapeutic Skin Lotion  ProShield Protective Hand Cream  Provon moisturizing lotion   Incentive Spirometer  An incentive spirometer is a tool that can help keep your lungs clear and active. This tool measures how well you are filling your lungs with each breath. Taking long deep breaths may help reverse or decrease the chance of developing breathing (pulmonary) problems (especially  infection) following: A long period of time when you are unable to move or be active. BEFORE THE PROCEDURE  If the spirometer includes an indicator to show your best effort, your nurse or respiratory therapist will set it to a desired goal. If possible, sit up straight or lean slightly forward. Try not to slouch. Hold the incentive spirometer in an upright position. INSTRUCTIONS FOR USE  Sit on the edge of your bed if possible, or sit up as far as you can in bed or on a chair. Hold the incentive spirometer in an upright position. Breathe out normally. Place the mouthpiece in your mouth and seal your lips tightly around it. Breathe in slowly and as deeply as possible, raising the piston or the ball toward the top of the column. Hold your breath for 3-5 seconds or for as long as possible. Allow the piston or ball to fall to the bottom of the column. Remove the mouthpiece from your mouth and breathe out normally. Rest for a few seconds and repeat Steps 1 through 7 at least 10 times every 1-2 hours when you are awake. Take  your time and take a few normal breaths between deep breaths. The spirometer may include an indicator to show your best effort. Use the indicator as a goal to work toward during each repetition. After each set of 10 deep breaths, practice coughing to be sure your lungs are clear. If you have an incision (the cut made at the time of surgery), support your incision when coughing by placing a pillow or rolled up towels firmly against it. Once you are able to get out of bed, walk around indoors and cough well. You may stop using the incentive spirometer when instructed by your caregiver.  RISKS AND COMPLICATIONS Take your time so you do not get dizzy or light-headed. If you are in pain, you may need to take or ask for pain medication before doing incentive spirometry. It is harder to take a deep breath if you are having pain. AFTER USE Rest and breathe slowly and easily. It can be  helpful to keep track of a log of your progress. Your caregiver can provide you with a simple table to help with this. If you are using the spirometer at home, follow these instructions: SEEK MEDICAL CARE IF:  You are having difficultly using the spirometer. You have trouble using the spirometer as often as instructed. Your pain medication is not giving enough relief while using the spirometer. You develop fever of 100.5 F (38.1 C) or higher. SEEK IMMEDIATE MEDICAL CARE IF:  You cough up bloody sputum that had not been present before. You develop fever of 102 F (38.9 C) or greater. You develop worsening pain at or near the incision site. MAKE SURE YOU:  Understand these instructions. Will watch your condition. Will get help right away if you are not doing well or get worse. Document Released: 01/08/2007 Document Revised: 11/20/2011 Document Reviewed: 03/11/2007 [facility] Patient Information 2014 [place], [place].   ________________________________________________________________________

## 2024-01-02 ENCOUNTER — Encounter (HOSPITAL_COMMUNITY): Payer: Self-pay

## 2024-01-02 ENCOUNTER — Encounter (HOSPITAL_COMMUNITY)
Admission: RE | Admit: 2024-01-02 | Discharge: 2024-01-02 | Disposition: A | Discharge status: Home / Self Care | Source: Ambulatory Visit | Attending: Orthopedic Surgery | Admitting: Orthopedic Surgery

## 2024-01-02 ENCOUNTER — Other Ambulatory Visit: Payer: Self-pay

## 2024-01-02 VITALS — BP 148/81 | HR 71 | Temp 97.4°F | Ht 72.0 in | Wt 295.0 lb

## 2024-01-02 DIAGNOSIS — Z8551 Personal history of malignant neoplasm of bladder: Secondary | ICD-10-CM | POA: Diagnosis not present

## 2024-01-02 DIAGNOSIS — I10 Essential (primary) hypertension: Secondary | ICD-10-CM | POA: Insufficient documentation

## 2024-01-02 DIAGNOSIS — Z01818 Encounter for other preprocedural examination: Secondary | ICD-10-CM

## 2024-01-02 DIAGNOSIS — Z01812 Encounter for preprocedural laboratory examination: Secondary | ICD-10-CM | POA: Insufficient documentation

## 2024-01-02 DIAGNOSIS — M1612 Unilateral primary osteoarthritis, left hip: Secondary | ICD-10-CM | POA: Insufficient documentation

## 2024-01-02 DIAGNOSIS — Z79899 Other long term (current) drug therapy: Secondary | ICD-10-CM | POA: Insufficient documentation

## 2024-01-02 DIAGNOSIS — G4730 Sleep apnea, unspecified: Secondary | ICD-10-CM | POA: Diagnosis not present

## 2024-01-02 DIAGNOSIS — I2510 Atherosclerotic heart disease of native coronary artery without angina pectoris: Secondary | ICD-10-CM | POA: Insufficient documentation

## 2024-01-02 HISTORY — DX: Unspecified osteoarthritis, unspecified site: M19.90

## 2024-01-02 HISTORY — DX: Depression, unspecified: F32.A

## 2024-01-02 LAB — TYPE AND SCREEN
ABO/RH(D): O POS
Antibody Screen: NEGATIVE

## 2024-01-02 LAB — SURGICAL PCR SCREEN
MRSA, PCR: NEGATIVE
Staphylococcus aureus: NEGATIVE

## 2024-01-02 NOTE — Progress Notes (Signed)
 For Anesthesia: PCP - Omie Bickers, MD  Cardiologist - Gerard Knight, MD  Clearance: Charles Connor: NP: 12/28/23 Bowel Prep reminder:  Chest x-ray - 06/01/23 CT chest: 11/29/23 EKG - 06/01/23 Stress Test -  ECHO - 04/25/23 Cardiac Cath - 03/31/2013 Pacemaker/ICD device last checked: Pacemaker orders received: Device Rep notified:  Spinal Cord Stimulator:  Sleep Study - Yes CPAP - Yes  Fasting Blood Sugar - N/A Checks Blood Sugar _____ times a day Date and result of last Hgb A1c-  Last dose of GLP1 agonist- semaglutid GLP1 instructions: To hold after:01/07/24  Last dose of SGLT-2 inhibitors- N/A SGLT-2 instructions:   Blood Thinner Instructions: Aspirin  Instructions:Will be hold 7 days before surgery. Last Dose:  Activity level: Can go up a flight of stairs and activities of daily living without stopping and without chest pain and/or shortness of breath      Unable to go up a flight of stairs without shortness of breath   Anesthesia review: Hx: CAD,HTN,Pre-DIA,OSA(CPAP)  Patient denies shortness of breath, fever, cough and chest pain at PAT appointment   Patient verbalized understanding of instructions that were given to them at the PAT appointment. Patient was also instructed that they will need to review over the PAT instructions again at home before surgery.

## 2024-01-03 ENCOUNTER — Inpatient Hospital Stay: Admitting: Hematology

## 2024-01-03 ENCOUNTER — Inpatient Hospital Stay

## 2024-01-03 NOTE — Progress Notes (Signed)
 Anesthesia Chart Review   Case: [id number] Date/Time: 01/15/24 0825   Procedure: ARTHROPLASTY, HIP, TOTAL, ANTERIOR APPROACH (Left: Hip)   Anesthesia type: Spinal   Pre-op diagnosis: Left hip osteoarthritis   Location: [facility] ROOM 10 / [facility]   Surgeons: Claiborne Crew, MD       DISCUSSION:67 y.o. former smoker with h/o HTN, sleep apnea, nonobstructive CAD, bladder cancer, left hip OA scheduled for above procedure 01/15/2024 with Dr. Claiborne Crew.   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  Last seen by heme/onc 12/20/23.  Dr. Cheree Cords aware of upcoming surgery, treatment will resume 3 weeks after surgery.   Per cardiology preoperative evaluation 12/28/2023, "Patient's RCRI score is 0.9%. The patient affirms he has been doing well without any new cardiac symptoms. They are able to achieve 5 METS without cardiac limitations. Therefore, based on ACC/AHA guidelines, the patient would be at acceptable risk for the planned procedure without further cardiovascular testing. The patient was advised that if he develops new symptoms prior to surgery to contact our office to arrange for a follow-up visit, and he verbalized understanding."  VS: BP (!) 148/81   Pulse 71   Temp (!) 36.3 C (Oral)   Ht 6' (1.829 m)   Wt 133.8 kg   SpO2 94%   BMI 40.01 kg/m   PROVIDERS: Omie Bickers, MD is PCP   Cardiologist - Gerard Knight, MD  LABS: Labs reviewed: Acceptable for surgery. (all labs ordered are listed, but only abnormal results are displayed)  Labs Reviewed  SURGICAL PCR SCREEN  TYPE AND SCREEN     IMAGES:   EKG:   CV: Echo 04/25/2023 1. Left ventricular ejection fraction, by estimation, is 60 to 65%. The  left ventricle has normal function. The left ventricle has no regional  wall motion abnormalities. There is mild asymmetric left ventricular  hypertrophy of the inferior segment. Left   ventricular diastolic parameters were normal.   2.  Right ventricular systolic function is mildly reduced. The right  ventricular size is normal. Tricuspid regurgitation signal is inadequate  for assessing PA pressure.   3. The mitral valve is normal in structure. No evidence of mitral valve  regurgitation. No evidence of mitral stenosis.   4. The aortic valve is normal in structure. Aortic valve regurgitation is  not visualized. No aortic stenosis is present.   5. The inferior vena cava is normal in size with greater than 50%  respiratory variability, suggesting right atrial pressure of 3 mmHg.  Past Medical History:  Diagnosis Date   Anxiety    Arthritis    Bladder cancer (HCC) 09/11/2009   CAD (coronary artery disease), native coronary artery    a. Mildly elevated troponin 03/2013, cath with nonobstructive disease including 50% AV groove distal stenosis before large OM   Depression    Essential hypertension    Headache(784.0)    History of migraines   Hyperglycemia    Mixed hyperlipidemia    Neuropathy    Obesity    Port-A-Cath in place 09/30/2021   Pre-diabetes    Seasonal allergies    Sleep apnea    On CPAP    Past Surgical History:  Procedure Laterality Date   BIOPSY  09/23/2021   Procedure: BIOPSY;  Surgeon: Urban Garden, MD;  [facility];  Service: Gastroenterology;;   COLONOSCOPY  06/28/2011   Procedure: COLONOSCOPY;  Surgeon: Ruby Corporal, MD;  [facility];  Service: Endoscopy;  Laterality: N/A;  COLONOSCOPY WITH PROPOFOL  N/A 09/23/2021   Procedure: COLONOSCOPY WITH PROPOFOL ;  Surgeon: Urban Garden, MD;  [facility];  Service: Gastroenterology;  Laterality: N/A;  940   ESOPHAGOGASTRODUODENOSCOPY (EGD) WITH PROPOFOL  N/A 09/23/2021   Procedure: ESOPHAGOGASTRODUODENOSCOPY (EGD) WITH PROPOFOL ;  Surgeon: Urban Garden, MD;  [facility];  Service: Gastroenterology;  Laterality: N/A;   IR IMAGING GUIDED PORT INSERTION  09/28/2021   IR  PARACENTESIS  09/28/2021   JOINT REPLACEMENT Right    hip   LEFT HEART CATHETERIZATION WITH CORONARY ANGIOGRAM N/A 03/31/2013   Procedure: LEFT HEART CATHETERIZATION WITH CORONARY ANGIOGRAM;  Surgeon: Eilleen Grates, MD;  Location: [facility] CATH LAB;  Service: Cardiovascular;  Laterality: N/A;   POLYPECTOMY  09/23/2021   Procedure: POLYPECTOMY;  Surgeon: Umberto Ganong, Bearl Limes, MD;  [facility];  Service: Gastroenterology;;   TURBT  09/11/2009    MEDICATIONS:  amLODipine  (NORVASC ) 10 MG tablet   aspirin  EC 81 MG tablet   bumetanide  (BUMEX ) 2 MG tablet   buPROPion  (WELLBUTRIN  XL) 300 MG 24 hr tablet   cetirizine (ZYRTEC) 10 MG tablet   cyclobenzaprine  (FLEXERIL ) 10 MG tablet   docusate sodium (COLACE) 100 MG capsule   Durvalumab  (IMFINZI  IV)   Eszopiclone 3 MG TABS   famotidine  (PEPCID ) 20 MG tablet   fenofibrate  160 MG tablet   FLUoxetine  (PROZAC ) 10 MG capsule   gabapentin  (NEURONTIN ) 300 MG capsule   guaiFENesin (MUCINEX) 600 MG 12 hr tablet   magnesium  oxide (MAG-OX) 400 (240 Mg) MG tablet   metFORMIN (GLUCOPHAGE-XR) 500 MG 24 hr tablet   Multiple Vitamin (MULTIVITAMIN WITH MINERALS) TABS tablet   nitroGLYCERIN  (NITROSTAT ) 0.4 MG SL tablet   NON FORMULARY   olmesartan  (BENICAR ) 40 MG tablet   pantoprazole  (PROTONIX ) 40 MG tablet   pramipexole  (MIRAPEX ) 0.75 MG tablet   prochlorperazine  (COMPAZINE ) 10 MG tablet   Saw Palmetto, Serenoa repens, (SAW PALMETTO PO)   Semaglutide 14 MG TABS   tamsulosin  (FLOMAX ) 0.4 MG CAPS capsule   traMADol  (ULTRAM ) 50 MG tablet   zaleplon  (SONATA ) 10 MG capsule   No current facility-administered medications for this encounter.    magnesium  sulfate 2 GM/50ML IVPB    Chick Cotton Ward, PA-C [facility] Pre-Surgical Testing [phone]

## 2024-01-03 NOTE — Anesthesia Preprocedure Evaluation (Addendum)
 Anesthesia Evaluation  Patient identified by MRN, date of birth, ID band Patient awake    Reviewed: Allergy & Precautions, NPO status , Patient's Chart, lab work & pertinent test results  Airway Mallampati: III  TM Distance: >3 FB Neck ROM: Full    Dental no notable dental hx.    Pulmonary sleep apnea , former smoker   Pulmonary exam normal        Cardiovascular hypertension, Pt. on medications + CAD and + Past MI  Normal cardiovascular exam     Neuro/Psych  Headaches PSYCHIATRIC DISORDERS Anxiety Depression     Neuromuscular disease    GI/Hepatic negative GI ROS, Neg liver ROS,,,  Endo/Other    Class 3 obesityPre-DM  Renal/GU negative Renal ROS     Musculoskeletal  (+) Arthritis ,    Abdominal   Peds  Hematology negative hematology ROS (+) Platelets 231     Anesthesia Other Findings Left hip osteoarthritis  Reproductive/Obstetrics                              Anesthesia Physical Anesthesia Plan  ASA: 3  Anesthesia Plan: Spinal   Post-op Pain Management:    Induction:   PONV Risk Score and Plan: 1 and Ondansetron , Dexamethasone , Propofol  infusion, Midazolam  and Treatment may vary due to age or medical condition  Airway Management Planned: Simple Face Mask  Additional Equipment:   Intra-op Plan:   Post-operative Plan:   Informed Consent: I have reviewed the patients History and Physical, chart, labs and discussed the procedure including the risks, benefits and alternatives for the proposed anesthesia with the patient or authorized representative who has indicated his/her understanding and acceptance.     Dental advisory given  Plan Discussed with: CRNA  Anesthesia Plan Comments: (PAT note 01/02/24)        Anesthesia Quick Evaluation

## 2024-01-15 ENCOUNTER — Ambulatory Visit (HOSPITAL_COMMUNITY): Admitting: Anesthesiology

## 2024-01-15 ENCOUNTER — Encounter (HOSPITAL_COMMUNITY)
Admission: RE | Disposition: A | Discharge status: Home / Self Care | Payer: Self-pay | Source: Ambulatory Visit | Attending: Orthopedic Surgery

## 2024-01-15 ENCOUNTER — Other Ambulatory Visit: Payer: Self-pay

## 2024-01-15 ENCOUNTER — Encounter (HOSPITAL_COMMUNITY): Payer: Self-pay | Admitting: Orthopedic Surgery

## 2024-01-15 ENCOUNTER — Observation Stay (HOSPITAL_COMMUNITY)
Admission: RE | Admit: 2024-01-15 | Discharge: 2024-01-16 | Disposition: A | Discharge status: Home / Self Care | Source: Ambulatory Visit | Attending: Orthopedic Surgery | Admitting: Orthopedic Surgery

## 2024-01-15 ENCOUNTER — Ambulatory Visit (HOSPITAL_COMMUNITY): Payer: Self-pay | Admitting: Physician Assistant

## 2024-01-15 ENCOUNTER — Ambulatory Visit (HOSPITAL_COMMUNITY)

## 2024-01-15 DIAGNOSIS — I2510 Atherosclerotic heart disease of native coronary artery without angina pectoris: Secondary | ICD-10-CM | POA: Diagnosis not present

## 2024-01-15 DIAGNOSIS — Z87891 Personal history of nicotine dependence: Secondary | ICD-10-CM | POA: Insufficient documentation

## 2024-01-15 DIAGNOSIS — Z96642 Presence of left artificial hip joint: Secondary | ICD-10-CM | POA: Diagnosis present

## 2024-01-15 DIAGNOSIS — Z79899 Other long term (current) drug therapy: Secondary | ICD-10-CM | POA: Insufficient documentation

## 2024-01-15 DIAGNOSIS — I10 Essential (primary) hypertension: Secondary | ICD-10-CM | POA: Insufficient documentation

## 2024-01-15 DIAGNOSIS — Z96641 Presence of right artificial hip joint: Secondary | ICD-10-CM | POA: Diagnosis not present

## 2024-01-15 DIAGNOSIS — Z7982 Long term (current) use of aspirin: Secondary | ICD-10-CM | POA: Insufficient documentation

## 2024-01-15 DIAGNOSIS — Z7984 Long term (current) use of oral hypoglycemic drugs: Secondary | ICD-10-CM | POA: Diagnosis not present

## 2024-01-15 DIAGNOSIS — Z8551 Personal history of malignant neoplasm of bladder: Secondary | ICD-10-CM | POA: Insufficient documentation

## 2024-01-15 DIAGNOSIS — M1612 Unilateral primary osteoarthritis, left hip: Secondary | ICD-10-CM | POA: Diagnosis present

## 2024-01-15 DIAGNOSIS — G4733 Obstructive sleep apnea (adult) (pediatric): Secondary | ICD-10-CM

## 2024-01-15 HISTORY — PX: TOTAL HIP ARTHROPLASTY: SHX124

## 2024-01-15 LAB — GLUCOSE, CAPILLARY
Glucose-Capillary: 120 mg/dL — ABNORMAL HIGH (ref 70–99)
Glucose-Capillary: 139 mg/dL — ABNORMAL HIGH (ref 70–99)
Glucose-Capillary: 209 mg/dL — ABNORMAL HIGH (ref 70–99)
Glucose-Capillary: 243 mg/dL — ABNORMAL HIGH (ref 70–99)

## 2024-01-15 LAB — ABO/RH: ABO/RH(D): O POS

## 2024-01-15 LAB — HEMOGLOBIN A1C
Hgb A1c MFr Bld: 6 % — ABNORMAL HIGH (ref 4.8–5.6)
Mean Plasma Glucose: 125.5 mg/dL

## 2024-01-15 SURGERY — ARTHROPLASTY, HIP, TOTAL, ANTERIOR APPROACH
Anesthesia: Spinal | Site: Hip | Laterality: Left

## 2024-01-15 MED ORDER — TIZANIDINE HCL 2 MG PO TABS
2.0000 mg | ORAL_TABLET | Freq: Three times a day (TID) | ORAL | Status: DC | PRN
  Administered 2024-01-16 (×2): 4 mg via ORAL
  Filled 2024-01-15 (×5): qty 2

## 2024-01-15 MED ORDER — DEXAMETHASONE SODIUM PHOSPHATE 10 MG/ML IJ SOLN
INTRAMUSCULAR | Status: AC
  Filled 2024-01-15: qty 1

## 2024-01-15 MED ORDER — ONDANSETRON HCL 4 MG/2ML IJ SOLN: 4.0000 mg | Freq: Four times a day (QID) | INTRAMUSCULAR | Status: DC | PRN

## 2024-01-15 MED ORDER — ONDANSETRON HCL 4 MG/2ML IJ SOLN
INTRAMUSCULAR | Status: AC
  Filled 2024-01-15: qty 2

## 2024-01-15 MED ORDER — SODIUM CHLORIDE 0.9% FLUSH: 3.0000 mL | INTRAVENOUS | Status: DC | PRN

## 2024-01-15 MED ORDER — AMISULPRIDE (ANTIEMETIC) 5 MG/2ML IV SOLN: 10.0000 mg | Freq: Once | INTRAVENOUS | Status: DC | PRN

## 2024-01-15 MED ORDER — CEFAZOLIN SODIUM-DEXTROSE 2-4 GM/100ML-% IV SOLN
2.0000 g | Freq: Four times a day (QID) | INTRAVENOUS | Status: AC
  Administered 2024-01-15 (×2): 2 g via INTRAVENOUS
  Filled 2024-01-15 (×2): qty 100

## 2024-01-15 MED ORDER — INSULIN ASPART 100 UNIT/ML IJ SOLN
0.0000 [IU] | Freq: Three times a day (TID) | INTRAMUSCULAR | Status: DC
  Administered 2024-01-15: 5 [IU] via SUBCUTANEOUS

## 2024-01-15 MED ORDER — BUPIVACAINE IN DEXTROSE 0.75-8.25 % IT SOLN
INTRATHECAL | Status: DC | PRN
  Administered 2024-01-15: 2 mL via INTRATHECAL

## 2024-01-15 MED ORDER — PROPOFOL 1000 MG/100ML IV EMUL
INTRAVENOUS | Status: AC
  Filled 2024-01-15: qty 100

## 2024-01-15 MED ORDER — KETOROLAC TROMETHAMINE 30 MG/ML IJ SOLN
INTRAMUSCULAR | Status: DC | PRN
  Administered 2024-01-15: 30 mg via INTRAMUSCULAR

## 2024-01-15 MED ORDER — OXYCODONE HCL 5 MG PO TABS: 10.0000 mg | ORAL_TABLET | ORAL | Status: DC | PRN

## 2024-01-15 MED ORDER — SENNA 8.6 MG PO TABS
2.0000 | ORAL_TABLET | Freq: Every day | ORAL | Status: DC
  Administered 2024-01-15: 17.2 mg via ORAL
  Filled 2024-01-15: qty 2

## 2024-01-15 MED ORDER — KETOROLAC TROMETHAMINE 30 MG/ML IJ SOLN
INTRAMUSCULAR | Status: AC
  Filled 2024-01-15: qty 1

## 2024-01-15 MED ORDER — PANTOPRAZOLE SODIUM 40 MG PO TBEC: 40.0000 mg | DELAYED_RELEASE_TABLET | Freq: Every day | ORAL | Status: DC

## 2024-01-15 MED ORDER — MAGNESIUM OXIDE -MG SUPPLEMENT 400 (240 MG) MG PO TABS
400.0000 mg | ORAL_TABLET | Freq: Two times a day (BID) | ORAL | Status: DC
  Administered 2024-01-15 – 2024-01-16 (×2): 400 mg via ORAL
  Filled 2024-01-15 (×2): qty 1

## 2024-01-15 MED ORDER — FENTANYL CITRATE (PF) 100 MCG/2ML IJ SOLN
INTRAMUSCULAR | Status: DC | PRN
  Administered 2024-01-15 (×2): 50 ug via INTRAVENOUS

## 2024-01-15 MED ORDER — ACETAMINOPHEN 500 MG PO TABS
1000.0000 mg | ORAL_TABLET | Freq: Four times a day (QID) | ORAL | Status: DC
  Administered 2024-01-15 – 2024-01-16 (×4): 1000 mg via ORAL
  Filled 2024-01-15 (×4): qty 2

## 2024-01-15 MED ORDER — MENTHOL 3 MG MT LOZG: 1.0000 | LOZENGE | OROMUCOSAL | Status: DC | PRN

## 2024-01-15 MED ORDER — BUPIVACAINE-EPINEPHRINE (PF) 0.25% -1:200000 IJ SOLN
INTRAMUSCULAR | Status: AC
  Filled 2024-01-15: qty 30

## 2024-01-15 MED ORDER — FAMOTIDINE 20 MG PO TABS
20.0000 mg | ORAL_TABLET | Freq: Every day | ORAL | Status: DC
  Administered 2024-01-15: 20 mg via ORAL
  Filled 2024-01-15: qty 1

## 2024-01-15 MED ORDER — BUPIVACAINE-EPINEPHRINE (PF) 0.25% -1:200000 IJ SOLN
INTRAMUSCULAR | Status: DC | PRN
  Administered 2024-01-15: 30 mL

## 2024-01-15 MED ORDER — ORAL CARE MOUTH RINSE: 15.0000 mL | Freq: Once | OROMUCOSAL | Status: AC

## 2024-01-15 MED ORDER — BUPROPION HCL ER (XL) 300 MG PO TB24
300.0000 mg | ORAL_TABLET | Freq: Every day | ORAL | Status: DC
  Administered 2024-01-16: 300 mg via ORAL
  Filled 2024-01-15: qty 1

## 2024-01-15 MED ORDER — CEFAZOLIN SODIUM-DEXTROSE 3-4 GM/150ML-% IV SOLN
3.0000 g | INTRAVENOUS | Status: AC
  Administered 2024-01-15: 3 g via INTRAVENOUS
  Filled 2024-01-15: qty 150

## 2024-01-15 MED ORDER — TRANEXAMIC ACID-NACL 1000-0.7 MG/100ML-% IV SOLN
1000.0000 mg | INTRAVENOUS | Status: AC
Start: 2024-01-15 — End: 2024-01-15
  Administered 2024-01-15: 1000 mg via INTRAVENOUS
  Filled 2024-01-15: qty 100

## 2024-01-15 MED ORDER — CHLORHEXIDINE GLUCONATE 0.12 % MT SOLN
15.0000 mL | Freq: Once | OROMUCOSAL | Status: AC
  Administered 2024-01-15: 15 mL via OROMUCOSAL

## 2024-01-15 MED ORDER — OXYCODONE HCL 5 MG PO TABS
5.0000 mg | ORAL_TABLET | ORAL | Status: DC | PRN
  Administered 2024-01-15: 5 mg via ORAL
  Administered 2024-01-16 (×2): 10 mg via ORAL
  Filled 2024-01-15: qty 1
  Filled 2024-01-15 (×2): qty 2

## 2024-01-15 MED ORDER — FENTANYL CITRATE PF 50 MCG/ML IJ SOSY: 25.0000 ug | PREFILLED_SYRINGE | INTRAMUSCULAR | Status: DC | PRN

## 2024-01-15 MED ORDER — BISACODYL 10 MG RE SUPP: 10.0000 mg | Freq: Every day | RECTAL | Status: DC | PRN

## 2024-01-15 MED ORDER — HYDROMORPHONE HCL 1 MG/ML IJ SOLN: 0.5000 mg | INTRAMUSCULAR | Status: DC | PRN

## 2024-01-15 MED ORDER — POLYETHYLENE GLYCOL 3350 17 G PO PACK
17.0000 g | PACK | Freq: Two times a day (BID) | ORAL | Status: DC
  Administered 2024-01-15 – 2024-01-16 (×2): 17 g via ORAL
  Filled 2024-01-15 (×2): qty 1

## 2024-01-15 MED ORDER — GABAPENTIN 300 MG PO CAPS
300.0000 mg | ORAL_CAPSULE | Freq: Four times a day (QID) | ORAL | Status: DC | PRN
  Administered 2024-01-15 – 2024-01-16 (×2): 300 mg via ORAL
  Filled 2024-01-15 (×2): qty 1

## 2024-01-15 MED ORDER — PRAMIPEXOLE DIHYDROCHLORIDE 0.25 MG PO TABS
0.7500 mg | ORAL_TABLET | Freq: Three times a day (TID) | ORAL | Status: DC
  Administered 2024-01-15 – 2024-01-16 (×4): 0.75 mg via ORAL
  Filled 2024-01-15 (×4): qty 3

## 2024-01-15 MED ORDER — FLUOXETINE HCL 10 MG PO CAPS
10.0000 mg | ORAL_CAPSULE | Freq: Every day | ORAL | Status: DC
  Administered 2024-01-16: 10 mg via ORAL
  Filled 2024-01-15: qty 1

## 2024-01-15 MED ORDER — SODIUM CHLORIDE (PF) 0.9 % IJ SOLN
INTRAMUSCULAR | Status: AC
Start: 2024-01-15 — End: ?
  Filled 2024-01-15: qty 30

## 2024-01-15 MED ORDER — FENOFIBRATE 160 MG PO TABS
160.0000 mg | ORAL_TABLET | Freq: Every day | ORAL | Status: DC
  Administered 2024-01-16: 160 mg via ORAL
  Filled 2024-01-15: qty 1

## 2024-01-15 MED ORDER — SEMAGLUTIDE 14 MG PO TABS: 14.0000 mg | ORAL_TABLET | Freq: Every day | ORAL | Status: DC

## 2024-01-15 MED ORDER — METFORMIN HCL ER 500 MG PO TB24
500.0000 mg | ORAL_TABLET | Freq: Two times a day (BID) | ORAL | Status: DC
  Administered 2024-01-16: 500 mg via ORAL
  Filled 2024-01-15: qty 1

## 2024-01-15 MED ORDER — ACETAMINOPHEN 10 MG/ML IV SOLN: 1000.0000 mg | Freq: Once | INTRAVENOUS | Status: DC | PRN

## 2024-01-15 MED ORDER — ALUM & MAG HYDROXIDE-SIMETH 200-200-20 MG/5ML PO SUSP: 30.0000 mL | ORAL | Status: DC | PRN

## 2024-01-15 MED ORDER — ONDANSETRON HCL 4 MG/2ML IJ SOLN: 4.0000 mg | Freq: Once | INTRAMUSCULAR | Status: DC | PRN

## 2024-01-15 MED ORDER — RIVAROXABAN 10 MG PO TABS
10.0000 mg | ORAL_TABLET | Freq: Every day | ORAL | Status: DC
Start: 2024-01-16 — End: 2024-01-16
  Administered 2024-01-16: 10 mg via ORAL
  Filled 2024-01-15: qty 1

## 2024-01-15 MED ORDER — PHENYLEPHRINE HCL-NACL 20-0.9 MG/250ML-% IV SOLN
INTRAVENOUS | Status: DC | PRN
  Administered 2024-01-15: 25 ug/min via INTRAVENOUS

## 2024-01-15 MED ORDER — STERILE WATER FOR IRRIGATION IR SOLN
Status: DC | PRN
  Administered 2024-01-15: 1000 mL

## 2024-01-15 MED ORDER — FENTANYL CITRATE (PF) 100 MCG/2ML IJ SOLN
INTRAMUSCULAR | Status: AC
  Filled 2024-01-15: qty 2

## 2024-01-15 MED ORDER — ZOLPIDEM TARTRATE 5 MG PO TABS
5.0000 mg | ORAL_TABLET | Freq: Every evening | ORAL | Status: DC | PRN
  Administered 2024-01-15: 5 mg via ORAL
  Filled 2024-01-15: qty 1

## 2024-01-15 MED ORDER — PROPOFOL 500 MG/50ML IV EMUL
INTRAVENOUS | Status: DC | PRN
  Administered 2024-01-15: 100 ug/kg/min via INTRAVENOUS

## 2024-01-15 MED ORDER — DEXAMETHASONE SODIUM PHOSPHATE 10 MG/ML IJ SOLN
10.0000 mg | Freq: Once | INTRAMUSCULAR | Status: AC
  Administered 2024-01-16: 10 mg via INTRAVENOUS
  Filled 2024-01-15: qty 1

## 2024-01-15 MED ORDER — TAMSULOSIN HCL 0.4 MG PO CAPS
0.4000 mg | ORAL_CAPSULE | Freq: Every day | ORAL | Status: DC
  Administered 2024-01-16: 0.4 mg via ORAL
  Filled 2024-01-15: qty 1

## 2024-01-15 MED ORDER — MIDAZOLAM HCL 5 MG/5ML IJ SOLN
INTRAMUSCULAR | Status: DC | PRN
Start: 2024-01-15 — End: 2024-01-15
  Administered 2024-01-15: 2 mg via INTRAVENOUS

## 2024-01-15 MED ORDER — BUMETANIDE 1 MG PO TABS
2.0000 mg | ORAL_TABLET | Freq: Every day | ORAL | Status: DC
Start: 2024-01-16 — End: 2024-01-16
  Filled 2024-01-15: qty 2

## 2024-01-15 MED ORDER — MIDAZOLAM HCL 2 MG/2ML IJ SOLN
INTRAMUSCULAR | Status: AC
  Filled 2024-01-15: qty 2

## 2024-01-15 MED ORDER — SODIUM CHLORIDE 0.9% FLUSH
3.0000 mL | Freq: Two times a day (BID) | INTRAVENOUS | Status: DC
  Administered 2024-01-15: 5 mL via INTRAVENOUS

## 2024-01-15 MED ORDER — IRBESARTAN 300 MG PO TABS
300.0000 mg | ORAL_TABLET | Freq: Every day | ORAL | Status: DC
  Administered 2024-01-16: 300 mg via ORAL
  Filled 2024-01-15: qty 2
  Filled 2024-01-15: qty 1

## 2024-01-15 MED ORDER — ALBUMIN HUMAN 5 % IV SOLN: INTRAVENOUS | Status: DC | PRN

## 2024-01-15 MED ORDER — ONDANSETRON HCL 4 MG/2ML IJ SOLN
INTRAMUSCULAR | Status: DC | PRN
  Administered 2024-01-15: 4 mg via INTRAVENOUS

## 2024-01-15 MED ORDER — PROPOFOL 10 MG/ML IV BOLUS
INTRAVENOUS | Status: DC | PRN
  Administered 2024-01-15: 30 mg via INTRAVENOUS

## 2024-01-15 MED ORDER — METOCLOPRAMIDE HCL 5 MG PO TABS: 5.0000 mg | ORAL_TABLET | Freq: Three times a day (TID) | ORAL | Status: DC | PRN

## 2024-01-15 MED ORDER — SODIUM CHLORIDE (PF) 0.9 % IJ SOLN
INTRAMUSCULAR | Status: DC | PRN
  Administered 2024-01-15: 30 mL

## 2024-01-15 MED ORDER — 0.9 % SODIUM CHLORIDE (POUR BTL) OPTIME
TOPICAL | Status: DC | PRN
  Administered 2024-01-15: 1000 mL

## 2024-01-15 MED ORDER — DEXAMETHASONE SODIUM PHOSPHATE 10 MG/ML IJ SOLN
8.0000 mg | Freq: Once | INTRAMUSCULAR | Status: AC
  Administered 2024-01-15: 8 mg via INTRAVENOUS

## 2024-01-15 MED ORDER — LIDOCAINE HCL (CARDIAC) PF 100 MG/5ML IV SOSY
PREFILLED_SYRINGE | INTRAVENOUS | Status: DC | PRN
  Administered 2024-01-15: 40 mg via INTRAVENOUS

## 2024-01-15 MED ORDER — ONDANSETRON HCL 4 MG PO TABS: 4.0000 mg | ORAL_TABLET | Freq: Four times a day (QID) | ORAL | Status: DC | PRN

## 2024-01-15 MED ORDER — DIPHENHYDRAMINE HCL 12.5 MG/5ML PO ELIX: 12.5000 mg | ORAL_SOLUTION | ORAL | Status: DC | PRN

## 2024-01-15 MED ORDER — METOCLOPRAMIDE HCL 5 MG/ML IJ SOLN: 5.0000 mg | Freq: Three times a day (TID) | INTRAMUSCULAR | Status: DC | PRN

## 2024-01-15 MED ORDER — POVIDONE-IODINE 10 % EX SWAB: 2.0000 | Freq: Once | CUTANEOUS | Status: DC

## 2024-01-15 MED ORDER — AMLODIPINE BESYLATE 10 MG PO TABS
10.0000 mg | ORAL_TABLET | Freq: Every day | ORAL | Status: DC
  Administered 2024-01-16: 10 mg via ORAL
  Filled 2024-01-15: qty 1

## 2024-01-15 MED ORDER — TRANEXAMIC ACID-NACL 1000-0.7 MG/100ML-% IV SOLN
1000.0000 mg | Freq: Once | INTRAVENOUS | Status: AC
  Administered 2024-01-15: 1000 mg via INTRAVENOUS
  Filled 2024-01-15: qty 100

## 2024-01-15 MED ORDER — PHENOL 1.4 % MT LIQD: 1.0000 | OROMUCOSAL | Status: DC | PRN

## 2024-01-15 MED ORDER — LACTATED RINGERS IV SOLN: INTRAVENOUS | Status: DC

## 2024-01-15 MED ORDER — PHENYLEPHRINE 80 MCG/ML (10ML) SYRINGE FOR IV PUSH (FOR BLOOD PRESSURE SUPPORT)
PREFILLED_SYRINGE | INTRAVENOUS | Status: DC | PRN
  Administered 2024-01-15: 160 ug via INTRAVENOUS

## 2024-01-15 MED ORDER — EPHEDRINE SULFATE (PRESSORS) 50 MG/ML IJ SOLN
INTRAMUSCULAR | Status: DC | PRN
  Administered 2024-01-15 (×2): 10 mg via INTRAVENOUS

## 2024-01-15 SURGICAL SUPPLY — 37 items
BAG COUNTER SPONGE SURGICOUNT (BAG) IMPLANT
BAG ZIPLOCK 12X15 (MISCELLANEOUS) IMPLANT
BLADE SAG 18X100X1.27 (BLADE) ×1 IMPLANT
COVER PERINEAL POST (MISCELLANEOUS) ×1 IMPLANT
COVER SURGICAL LIGHT HANDLE (MISCELLANEOUS) ×1 IMPLANT
CUP ACET PINNACLE SECTR 56MM (Hips) IMPLANT
DERMABOND ADVANCED .7 DNX12 (GAUZE/BANDAGES/DRESSINGS) ×1 IMPLANT
DRAPE FOOT SWITCH (DRAPES) ×1 IMPLANT
DRAPE STERI IOBAN 125X83 (DRAPES) ×1 IMPLANT
DRAPE U-SHAPE 47X51 STRL (DRAPES) ×2 IMPLANT
DRESSING AQUACEL AG SP 3.5X10 (GAUZE/BANDAGES/DRESSINGS) ×1 IMPLANT
DURAPREP 26ML APPLICATOR (WOUND CARE) ×1 IMPLANT
ELECT REM PT RETURN 15FT ADLT (MISCELLANEOUS) ×1 IMPLANT
GLOVE BIO SURGEON STRL SZ 6 (GLOVE) ×1 IMPLANT
GLOVE BIOGEL PI IND STRL 6.5 (GLOVE) ×1 IMPLANT
GLOVE BIOGEL PI IND STRL 7.5 (GLOVE) ×1 IMPLANT
GLOVE ORTHO TXT STRL SZ7.5 (GLOVE) ×2 IMPLANT
GOWN STRL REUS W/ TWL LRG LVL3 (GOWN DISPOSABLE) ×2 IMPLANT
HEAD CERAMIC 36 PLUS5 (Hips) IMPLANT
HOLDER FOLEY CATH W/STRAP (MISCELLANEOUS) ×1 IMPLANT
KIT TURNOVER KIT A (KITS) IMPLANT
MANIFOLD NEPTUNE II (INSTRUMENTS) ×1 IMPLANT
NDL SAFETY ECLIPSE 18X1.5 (NEEDLE) IMPLANT
PACK ANTERIOR HIP CUSTOM (KITS) ×1 IMPLANT
PENCIL SMOKE EVACUATOR (MISCELLANEOUS) ×1 IMPLANT
PINNACLE ALTRX PLUS 4 N 36X56 (Hips) IMPLANT
SCREW 6.5MMX30MM (Screw) IMPLANT
STEM FEMORAL SZ5 HIGH ACTIS (Stem) IMPLANT
SUT MNCRL AB 4-0 PS2 18 (SUTURE) ×1 IMPLANT
SUT VIC AB 1 CT1 36 (SUTURE) ×3 IMPLANT
SUT VIC AB 2-0 CT1 TAPERPNT 27 (SUTURE) ×2 IMPLANT
SUTURE STRATFX 0 PDS 27 VIOLET (SUTURE) ×1 IMPLANT
SYR 3ML LL SCALE MARK (SYRINGE) IMPLANT
TOWEL GREEN STERILE FF (TOWEL DISPOSABLE) ×1 IMPLANT
TRAY FOLEY MTR SLVR 16FR STAT (SET/KITS/TRAYS/PACK) ×1 IMPLANT
TUBE SUCTION HIGH CAP CLEAR NV (SUCTIONS) ×1 IMPLANT
WATER STERILE IRR 1000ML POUR (IV SOLUTION) ×1 IMPLANT

## 2024-01-15 NOTE — Care Plan (Signed)
 Ortho Bundle Case Management Note  Patient Details  Name: Elder Szafran MRN: [id number] Date of Birth: [birth year]                  LT THA on 01/15/24. DCP: Home with friend DME: RW, ordered through [employer] PT: HEP   DME Arranged:  Walker rolling DME Agency:  [employer]    Additional Comments: Please contact me with any questions of if this plan should need to change.    Kathlene Paradise, Case Manager [facility]  [phone] 01/15/2024, 11:18 AM

## 2024-01-15 NOTE — Anesthesia Procedure Notes (Signed)
 Spinal  Patient location during procedure: OR Start time: 01/15/2024 12:02 PM End time: 01/15/2024 12:07 PM Reason for block: surgical anesthesia Staffing Performed: anesthesiologist  Anesthesiologist: Peggi Bowels, MD Performed by: Peggi Bowels, MD Authorized by: Peggi Bowels, MD   Preanesthetic Checklist Completed: patient identified, IV checked, risks and benefits discussed, surgical consent, monitors and equipment checked, pre-op evaluation and timeout performed Spinal Block Patient position: sitting Prep: DuraPrep Patient monitoring: cardiac monitor, continuous pulse ox and blood pressure Approach: midline Location: L4-5 Injection technique: single-shot Needle Needle type: Pencan  Needle gauge: 24 G Needle length: 9 cm Assessment Sensory level: T10 Events: CSF return Additional Notes Functioning IV was confirmed and monitors were applied. Sterile prep and drape, including hand hygiene and sterile gloves were used. The patient was positioned and the spine was prepped. The skin was anesthetized with lidocaine .  Free flow of clear CSF was obtained prior to injecting local anesthetic into the CSF.  The spinal needle aspirated freely following injection.  The needle was carefully withdrawn.  The patient tolerated the procedure well.

## 2024-01-15 NOTE — Transfer of Care (Signed)
 Immediate Anesthesia Transfer of Care Note  Patient: Marvin Carr  Procedure(s) Performed: ARTHROPLASTY, HIP, TOTAL, ANTERIOR APPROACH (Left: Hip)  Patient Location: PACU  Anesthesia Type:MAC combined with regional for post-op pain  Level of Consciousness: awake and alert   Airway & Oxygen Therapy: Patient Spontanous Breathing and Patient connected to face mask oxygen  Post-op Assessment: Report given to RN and Post -op Vital signs reviewed and stable  Post vital signs: Reviewed and stable  Last Vitals:  Vitals Value Taken Time  BP 83/53 01/15/24 1339  Temp 36.3 C 01/15/24 1337  Pulse 66 01/15/24 1345  Resp 17 01/15/24 1345  SpO2 100 % 01/15/24 1345  Vitals shown include unfiled device data.  Last Pain:  Vitals:   01/15/24 1337  TempSrc:   PainSc: Asleep         Complications: No notable events documented.

## 2024-01-15 NOTE — Evaluation (Signed)
 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  Clinical Impression  Pt is s/p THA resulting in the deficits listed below (see PT Problem List). Pt able to come to sit EOB with sup A and cues for hand placement, HOB elevated. Has good sitting balance once upright. Comes to stand with CGA and walker for support. Pt has decreased activation of LLE at this time and is able to march with min A EOB, unable to initiate steps away from bed. Side steps at mod A x14ft. Remains sitting EOB and is preparing to order dinner. Pt will benefit from acute skilled PT to increase their independence and safety with mobility to facilitate discharge.          If plan is discharge home, recommend the following: A lot of help with walking and/or transfers;A lot of help with bathing/dressing/bathroom;Assistance with cooking/housework;Assist for transportation;Help with stairs or ramp for entrance   Can travel by private vehicle        Equipment Recommendations Rolling walker (2 wheels)  Recommendations for Other Services       Functional Status Assessment Patient has had a recent decline in their functional status and demonstrates the ability to make significant improvements in function in a reasonable and predictable amount of time.     Precautions / Restrictions Precautions Precautions: Fall Recall of Precautions/Restrictions: Intact Restrictions Weight Bearing Restrictions Per Provider Order: Yes LLE Weight Bearing Per Provider Order: Weight bearing as tolerated      Mobility  Bed Mobility Overal bed mobility: Needs Assistance Bed Mobility: Supine to Sit     Supine to sit: Supervision     General bed mobility comments: uses gait belt and cues to come to sit EOB, HR use to bring trunk upright    Transfers Overall transfer level: Needs  assistance Equipment used: Rolling walker (2 wheels) Transfers: Sit to/from Stand Sit to Stand: Contact guard assist           General transfer comment: inc time due to LLE still waking up from surgery    Ambulation/Gait                  Stairs            Wheelchair Mobility     Tilt Bed    Modified [name] (Stroke Patients Only)       Balance Overall balance assessment: Needs assistance Sitting-balance support: Feet supported, No upper extremity supported Sitting balance-Leahy Scale: Normal       Standing balance-Leahy Scale: Poor Standing balance comment: with 2WW                             Pertinent Vitals/Pain Pain Assessment Pain Assessment: No/denies pain    Home Living Family/patient expects to be discharged to:: Private residence Living Arrangements: Alone Available Help at Discharge: Friend(s);Available PRN/intermittently Type of Home: House Home Access: Stairs to enter Entrance Stairs-Rails: Lawyer of Steps: 3   Home Layout: One level Home Equipment: Cane - single point      Prior Function Prior Level of Function : Independent/Modified Independent                     Extremity/Trunk Assessment   Upper Extremity Assessment Upper Extremity Assessment: Overall WFL for tasks assessed  Lower Extremity Assessment Lower Extremity Assessment: Difficult to assess due to impaired cognition (LLE still numb from surgery, decreased function)    Cervical / Trunk Assessment Cervical / Trunk Assessment: Normal  Communication   Communication Communication: No apparent difficulties    Cognition Arousal: Alert Behavior During Therapy: WFL for tasks assessed/performed   PT - Cognitive impairments: No apparent impairments                         Following commands: Intact       Cueing Cueing Techniques: Verbal cues     General Comments General comments (skin integrity,  edema, etc.): LLE still not functional from surgery-no mobility away from bed    Exercises     Assessment/Plan    PT Assessment Patient needs continued PT services  PT Problem List Decreased strength;Decreased activity tolerance;Decreased mobility;Decreased range of motion;Decreased balance;Decreased coordination       PT Treatment Interventions DME instruction;Functional mobility training;Balance training;Gait training;Therapeutic activities;Stair training;Therapeutic exercise    PT Goals (Current goals can be found in the Care Plan section)  Acute Rehab PT Goals Patient Stated Goal: improve LLE function PT Goal Formulation: With patient Time For Goal Achievement: 01/29/24 Potential to Achieve Goals: Good    Frequency 7X/week     Co-evaluation               AM-PAC PT "6 Clicks" Mobility  Outcome Measure Help needed turning from your back to your side while in a flat bed without using bedrails?: A Little Help needed moving from lying on your back to sitting on the side of a flat bed without using bedrails?: A Little Help needed moving to and from a bed to a chair (including a wheelchair)?: A Little Help needed standing up from a chair using your arms (e.g., wheelchair or bedside chair)?: A Little Help needed to walk in hospital room?: Total Help needed climbing 3-5 steps with a railing? : Total 6 Click Score: 14    End of Session Equipment Utilized During Treatment: Gait belt Activity Tolerance: Patient tolerated treatment well Patient left: in bed;with call bell/phone within reach Nurse Communication: Mobility status PT Visit Diagnosis: Difficulty in walking, not elsewhere classified (R26.2)    Time: 1610-9604 PT Time Calculation (min) (ACUTE ONLY): 20 min   Charges:   PT Evaluation $PT Eval Low Complexity: 1 Low   PT General Charges $$ ACUTE PT VISIT: 1 Visit         [facility] PT Acute Rehabilitation Services Office: [phone] 01/15/2024   Serafin Dames 01/15/2024, 5:04 PM

## 2024-01-15 NOTE — Discharge Instructions (Addendum)
INSTRUCTIONS AFTER JOINT REPLACEMENT   Remove items at home which could result in a fall. This includes throw rugs or furniture in walking pathways ICE to the affected joint every three hours while awake for 30 minutes at a time, for at least the first 3-5 days, and then as needed for pain and swelling.  Continue to use ice for pain and swelling. You may notice swelling that will progress down to the foot and ankle.  This is normal after surgery.  Elevate your leg when you are not up walking on it.   Continue to use the breathing machine you got in the hospital (incentive spirometer) which will help keep your temperature down.  It is common for your temperature to cycle up and down following surgery, especially at night when you are not up moving around and exerting yourself.  The breathing machine keeps your lungs expanded and your temperature down.   DIET:  As you were doing prior to hospitalization, we recommend a well-balanced diet.  DRESSING / WOUND CARE / SHOWERING  Keep the surgical dressing until follow up.  The dressing is water proof, so you can shower without any extra covering.  IF THE DRESSING FALLS OFF or the wound gets wet inside, change the dressing with sterile gauze.  Please use good hand washing techniques before changing the dressing.  Do not use any lotions or creams on the incision until instructed by your surgeon.    ACTIVITY  Increase activity slowly as tolerated, but follow the weight bearing instructions below.   No driving for 6 weeks or until further direction given by your physician.  You cannot drive while taking narcotics.  No lifting or carrying greater than 10 lbs. until further directed by your surgeon. Avoid periods of inactivity such as sitting longer than an hour when not asleep. This helps prevent blood clots.  You may return to work once you are authorized by your doctor.     WEIGHT BEARING   Weight bearing as tolerated with assist device (walker, cane,  etc) as directed, use it as long as suggested by your surgeon or therapist, typically at least 4-6 weeks.   EXERCISES  Results after joint replacement surgery are often greatly improved when you follow the exercise, range of motion and muscle strengthening exercises prescribed by your doctor. Safety measures are also important to protect the joint from further injury. Any time any of these exercises cause you to have increased pain or swelling, decrease what you are doing until you are comfortable again and then slowly increase them. If you have problems or questions, call your caregiver or physical therapist for advice.   Rehabilitation is important following a joint replacement. After just a few days of immobilization, the muscles of the leg can become weakened and shrink (atrophy).  These exercises are designed to build up the tone and strength of the thigh and leg muscles and to improve motion. Often times heat used for twenty to thirty minutes before working out will loosen up your tissues and help with improving the range of motion but do not use heat for the first two weeks following surgery (sometimes heat can increase post-operative swelling).   These exercises can be done on a training (exercise) mat, on the floor, on a table or on a bed. Use whatever works the best and is most comfortable for you.    Use music or television while you are exercising so that the exercises are a pleasant break in your   day. This will make your life better with the exercises acting as a break in your routine that you can look forward to.   Perform all exercises about fifteen times, three times per day or as directed.  You should exercise both the operative leg and the other leg as well.  Exercises include:   Quad Sets - Tighten up the muscle on the front of the thigh (Quad) and hold for 5-10 seconds.   Straight Leg Raises - With your knee straight (if you were given a brace, keep it on), lift the leg to 60  degrees, hold for 3 seconds, and slowly lower the leg.  Perform this exercise against resistance later as your leg gets stronger.  Leg Slides: Lying on your back, slowly slide your foot toward your buttocks, bending your knee up off the floor (only go as far as is comfortable). Then slowly slide your foot back down until your leg is flat on the floor again.  Angel Wings: Lying on your back spread your legs to the side as far apart as you can without causing discomfort.  Hamstring Strength:  Lying on your back, push your heel against the floor with your leg straight by tightening up the muscles of your buttocks.  Repeat, but this time bend your knee to a comfortable angle, and push your heel against the floor.  You may put a pillow under the heel to make it more comfortable if necessary.   A rehabilitation program following joint replacement surgery can speed recovery and prevent re-injury in the future due to weakened muscles. Contact your doctor or a physical therapist for more information on knee rehabilitation.    CONSTIPATION  Constipation is defined medically as fewer than three stools per week and severe constipation as less than one stool per week.  Even if you have a regular bowel pattern at home, your normal regimen is likely to be disrupted due to multiple reasons following surgery.  Combination of anesthesia, postoperative narcotics, change in appetite and fluid intake all can affect your bowels.   YOU MUST use at least one of the following options; they are listed in order of increasing strength to get the job done.  They are all available over the counter, and you may need to use some, POSSIBLY even all of these options:    Drink plenty of fluids (prune juice may be helpful) and high fiber foods Colace 100 mg by mouth twice a day  Senokot for constipation as directed and as needed Dulcolax (bisacodyl), take with full glass of water  Miralax (polyethylene glycol) once or twice a day as  needed.  If you have tried all these things and are unable to have a bowel movement in the first 3-4 days after surgery call either your surgeon or your primary doctor.    If you experience loose stools or diarrhea, hold the medications until you stool forms back up.  If your symptoms do not get better within 1 week or if they get worse, check with your doctor.  If you experience "the worst abdominal pain ever" or develop nausea or vomiting, please contact the office immediately for further recommendations for treatment.   ITCHING:  If you experience itching with your medications, try taking only a single pain pill, or even half a pain pill at a time.  You can also use Benadryl over the counter for itching or also to help with sleep.   TED HOSE STOCKINGS:  Use stockings on both   legs until for at least 2 weeks or as directed by physician office. They may be removed at night for sleeping.  MEDICATIONS:  See your medication summary on the "After Visit Summary" that nursing will review with you.  You may have some home medications which will be placed on hold until you complete the course of blood thinner medication.  It is important for you to complete the blood thinner medication as prescribed.  PRECAUTIONS:  If you experience chest pain or shortness of breath - call 911 immediately for transfer to the hospital emergency department.   If you develop a fever greater that 101 F, purulent drainage from wound, increased redness or drainage from wound, foul odor from the wound/dressing, or calf pain - CONTACT YOUR SURGEON.                                                   FOLLOW-UP APPOINTMENTS:  If you do not already have a post-op appointment, please call the office for an appointment to be seen by your surgeon.  Guidelines for how soon to be seen are listed in your "After Visit Summary", but are typically between 1-4 weeks after surgery.  OTHER INSTRUCTIONS:   Knee Replacement:  Do not place pillow  under knee, focus on keeping the knee straight while resting. CPM instructions: 0-90 degrees, 2 hours in the morning, 2 hours in the afternoon, and 2 hours in the evening. Place foam block, curve side up under heel at all times except when in CPM or when walking.  DO NOT modify, tear, cut, or change the foam block in any way.  POST-OPERATIVE OPIOID TAPER INSTRUCTIONS: It is important to wean off of your opioid medication as soon as possible. If you do not need pain medication after your surgery it is ok to stop day one. Opioids include: Codeine, Hydrocodone(Norco, Vicodin), Oxycodone(Percocet, oxycontin) and hydromorphone amongst others.  Long term and even short term use of opiods can cause: Increased pain response Dependence Constipation Depression Respiratory depression And more.  Withdrawal symptoms can include Flu like symptoms Nausea, vomiting And more Techniques to manage these symptoms Hydrate well Eat regular healthy meals Stay active Use relaxation techniques(deep breathing, meditating, yoga) Do Not substitute Alcohol to help with tapering If you have been on opioids for less than two weeks and do not have pain than it is ok to stop all together.  Plan to wean off of opioids This plan should start within one week post op of your joint replacement. Maintain the same interval or time between taking each dose and first decrease the dose.  Cut the total daily intake of opioids by one tablet each day Next start to increase the time between doses. The last dose that should be eliminated is the evening dose.   MAKE SURE YOU:  Understand these instructions.  Get help right away if you are not doing well or get worse.    Thank you for letting us be a part of your medical care team.  It is a privilege we respect greatly.  We hope these instructions will help you stay on track for a fast and full recovery!    Information on my medicine - XARELTO (Rivaroxaban)  This medication  education was reviewed with me or my healthcare representative as part of my discharge preparation.  The pharmacist that  spoke with me during my hospital stay was:  Pasty Spillers, [facility]  Why was Xarelto prescribed for you? Xarelto was prescribed for you to reduce the risk of blood clots forming after orthopedic surgery. The medical term for these abnormal blood clots is venous thromboembolism (VTE).  What do you need to know about xarelto ? Take your Xarelto ONCE DAILY at the same time every day. You may take it either with or without food.  If you have difficulty swallowing the tablet whole, you may crush it and mix in applesauce just prior to taking your dose.  Take Xarelto exactly as prescribed by your doctor and DO NOT stop taking Xarelto without talking to the doctor who prescribed the medication.  Stopping without other VTE prevention medication to take the place of Xarelto may increase your risk of developing a clot.  After discharge, you should have regular check-up appointments with your healthcare provider that is prescribing your Xarelto.    What do you do if you miss a dose? If you miss a dose, take it as soon as you remember on the same day then continue your regularly scheduled once daily regimen the next day. Do not take two doses of Xarelto on the same day.   Important Safety Information A possible side effect of Xarelto is bleeding. You should call your healthcare provider right away if you experience any of the following: Bleeding from an injury or your nose that does not stop. Unusual colored urine (red or dark brown) or unusual colored stools (red or black). Unusual bruising for unknown reasons. A serious fall or if you hit your head (even if there is no bleeding).  Some medicines may interact with Xarelto and might increase your risk of bleeding while on Xarelto. To help avoid this, consult your healthcare provider or pharmacist prior to using  any new prescription or non-prescription medications, including herbals, vitamins, non-steroidal anti-inflammatory drugs (NSAIDs) and supplements.  This website has more information on Xarelto: VisitDestination.com.br.

## 2024-01-15 NOTE — H&P (Signed)
 TOTAL HIP ADMISSION H&P  Patient is admitted for left total hip arthroplasty.  Therapy Plans: HEP Disposition: Home with friends helping Planned DVT Prophylaxis: Xarelto 10mg  daily (cancer) DME needed: walker PCP: Dr. [name] - awaiting form Cardio: Dr. 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 Cheree Cords - will resume 3 weeks post op - abx after due to BMI and chemo? - oxycodone, tizanidine 4 mg, tylenol  - No NSAIDs  Subjective:  Chief Complaint: left hip pain  HPI: Marvin Carr, 68 y.o. male, has a history of pain and functional disability in the left hip(s) due to arthritis and patient has failed non-surgical conservative treatments for greater than 12 weeks to include NSAID's and/or analgesics and corticosteriod injections.  Onset of symptoms was gradual starting 2 years ago with gradually worsening course since that time.The patient noted no past surgery on the left hip(s).  Patient currently rates pain in the left hip at 8 out of 10 with activity. Patient has night pain, worsening of pain with activity and weight bearing, and pain that interfers with activities of daily living. Patient has evidence of joint space narrowing by imaging studies. This condition presents safety issues increasing the risk of falls.  There is no current active infection.  Patient Active Problem List   Diagnosis Date Noted   DOE (dyspnea on exertion) 09/25/2023   Chemotherapy-induced peripheral neuropathy (HCC) 07/03/2023   Nocturnal hypoxia 04/06/2023   Neuropathy associated with cancer (HCC) 02/22/2023   Edema due to hypoalbuminemia 02/22/2023   RLS (restless legs syndrome) 02/22/2023   History of insomnia 02/22/2023   History of restless legs syndrome 02/22/2023    Chronic skin ulcer (HCC) 02/22/2023   OSA (obstructive sleep apnea) 01/29/2023   Fever 06/04/2022   Sepsis due to cellulitis (HCC) 06/04/2022   Morbid obesity due to excess calories (HCC)    Depression    Pre-diabetes    Forgetfulness 03/29/2022   Port-A-Cath in place 09/30/2021   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 Mildly elevated troponin 03/2013, cath with nonobstructive disease including 50% AV groove distal stenosis before large OM   Depression    Essential hypertension    Headache(784.0)    History of migraines   Hyperglycemia    Mixed hyperlipidemia    Neuropathy    Obesity    Port-A-Cath in place 09/30/2021   Pre-diabetes    Seasonal allergies    Sleep apnea    On CPAP    Past Surgical History:  Procedure Laterality Date   BIOPSY  09/23/2021   Procedure: BIOPSY;  Surgeon: Urban Garden, MD;  [facility]: [facility];  Service: Gastroenterology;;   COLONOSCOPY  06/28/2011   Procedure: COLONOSCOPY;  Surgeon: Ruby Corporal, MD;  Location: [facility];  Service:  Endoscopy;  Laterality: N/A;   COLONOSCOPY WITH PROPOFOL  N/A 09/23/2021   Procedure: COLONOSCOPY WITH PROPOFOL ;  Surgeon: Urban Garden, MD;  Location: [facility];  Service: Gastroenterology;  Laterality: N/A;  940   ESOPHAGOGASTRODUODENOSCOPY (EGD) WITH PROPOFOL  N/A 09/23/2021   Procedure: ESOPHAGOGASTRODUODENOSCOPY (EGD) WITH PROPOFOL ;  Surgeon: Urban Garden, MD;  Location: [facility];  Service: Gastroenterology;  Laterality: N/A;   IR IMAGING GUIDED PORT INSERTION  09/28/2021   IR PARACENTESIS  09/28/2021   JOINT REPLACEMENT Right    hip   LEFT HEART CATHETERIZATION WITH CORONARY ANGIOGRAM N/A 03/31/2013   Procedure: LEFT HEART CATHETERIZATION WITH CORONARY ANGIOGRAM;  Surgeon: Eilleen Grates, MD;  Location: [facility] CATH [facility];  Service: Cardiovascular;  Laterality: N/A;   POLYPECTOMY  09/23/2021   Procedure: POLYPECTOMY;  Surgeon: Urban Garden, MD;  Location: [facility];  Service: Gastroenterology;;   TURBT  09/11/2009    No current facility-administered medications for this encounter.   Current Outpatient Medications  Medication Sig Dispense Refill Last Dose/Taking   amLODipine  (NORVASC ) 10 MG tablet Take 1 tablet (10 mg total) by mouth daily. 90 tablet 3 Taking   aspirin  EC 81 MG tablet Take 81 mg by mouth daily.   Taking   bumetanide  (BUMEX ) 2 MG tablet Take 1 tablet (2 mg total) by mouth daily as needed. (Patient taking differently: Take 2 mg by mouth daily.)   Taking Differently   buPROPion  (WELLBUTRIN  XL) 300 MG 24 hr tablet Take 300 mg by mouth daily.   Taking   cetirizine (ZYRTEC) 10 MG tablet Take 10 mg by mouth daily.   Taking   cyclobenzaprine  (FLEXERIL ) 10 MG tablet Take 10 mg by mouth at bedtime as needed for muscle spasms.   Taking As Needed   docusate sodium (COLACE) 100 MG capsule Take 100-200 mg by mouth at bedtime as needed for mild constipation.   Taking As Needed   Eszopiclone 3 MG TABS Take 3 mg by mouth at bedtime.   Taking   famotidine  (PEPCID ) 20 MG tablet Take 20 mg by mouth at bedtime.   Taking   fenofibrate  160 MG tablet Take 160 mg by mouth daily.   Taking   FLUoxetine  (PROZAC ) 10 MG capsule Take 10 mg by mouth daily.   Taking   gabapentin  (NEURONTIN ) 300 MG capsule Take 300 mg by mouth every 6 (six) hours as needed (pain).   Taking As Needed   guaiFENesin (MUCINEX) 600 MG 12 hr tablet  Take 600 mg by mouth 2 (two) times daily as needed for cough or to loosen phlegm.   Taking As Needed   magnesium  oxide (MAG-OX) 400 (240 Mg) MG tablet Take 1 tablet (400 mg total) by mouth 2 (two) times daily. 90 tablet 6 Taking   metFORMIN (GLUCOPHAGE-XR) 500 MG 24 hr tablet Take 500 mg by mouth 2 (two) times daily.   Taking   Multiple Vitamin (MULTIVITAMIN WITH MINERALS) TABS tablet Take 1 tablet by mouth daily.   Taking   nitroGLYCERIN  (NITROSTAT ) 0.4 MG SL tablet Place 0.4 mg under the tongue every 5 (five) minutes x 3 doses as needed for chest pain (if no relief after 2nd dose, proceed to ED or call 911).   Taking As Needed   NON FORMULARY Pt uses a cpap   Taking   pramipexole  (MIRAPEX ) 0.75 MG tablet Take 1 tablet (0.75 mg total) by mouth 3 (three) times daily. 90 tablet 0 Taking   prochlorperazine  (COMPAZINE ) 10 MG tablet Take 1  tablet (10 mg total) by mouth every 6 (six) hours as needed for nausea or vomiting. 30 tablet 2 Taking As Needed   Saw Palmetto, Serenoa repens, (SAW PALMETTO PO) Take 1 capsule by mouth daily.   Taking   Semaglutide 14 MG TABS Take 14 mg by mouth daily.   Taking   tamsulosin  (FLOMAX ) 0.4 MG CAPS capsule Take 0.4 mg by mouth daily.   Taking   traMADol  (ULTRAM ) 50 MG tablet Take 50 mg by mouth 4 (four) times daily as needed for moderate pain.   Taking As Needed   zaleplon  (SONATA ) 10 MG capsule Take 1 capsule (10 mg total) by mouth at bedtime as needed for sleep. 30 capsule 5 Taking   Durvalumab  (IMFINZI  IV) Inject into the vein every 21 ( twenty-one) days.      olmesartan  (BENICAR ) 40 MG tablet TAKE (1) TABLET BY MOUTH ONCE DAILY. 30 tablet 5    pantoprazole  (PROTONIX ) 40 MG tablet Take 1 tablet (40 mg total) by mouth daily. (Patient not taking: Reported on 12/27/2023) 90 tablet 0 Not Taking   Facility-Administered Medications Ordered in Other Encounters  Medication Dose Route Frequency Provider Last Rate Last Admin   magnesium  sulfate 2 GM/50ML IVPB            No  Known Allergies  Social History   Tobacco Use   Smoking status: Former    Current packs/day: 0.00    Average packs/day: 0.5 packs/day for 10.0 years (5.0 ttl pk-yrs)    Types: Cigarettes    Start date: 07/09/2001    Quit date: 07/10/2011    Years since quitting: 12.5   Smokeless tobacco: Never   Tobacco comments:    Quit several yrs prior to 03/2013.  Substance Use Topics   Alcohol use: Yes    Alcohol/week: 0.0 standard drinks of alcohol    Comment: Occasional    Family History  Problem Relation Age of Onset   COPD Father      Review of Systems  Constitutional:  Negative for chills and fever.  Respiratory:  Negative for cough and shortness of breath.   Cardiovascular:  Negative for chest pain.  Gastrointestinal:  Negative for nausea and vomiting.  Musculoskeletal:  Positive for arthralgias.     Objective:  Physical Exam Well nourished and well developed. General: Alert and oriented x3, cooperative and pleasant, no acute distress.  Musculoskeletal: Left hip exam: Painful and limited hip flexion internal rotation to 10 degrees with pelvic tilting, external rotation over 20 degrees Mild tenderness around the left hip 5 -/5 strength with active hip flexion and abduction due to discomfort without significant external rotation contracture  Right hip exam: Passive range of motion of the right hip is fluid and pain-free with mild tightness 5/5 strength with active hip flexion He is neurovascularly intact distally both lower extremities  Vital signs in last 24 hours:    Labs:   Estimated body mass index is 40.01 kg/m as calculated from the following:   Height as of 01/02/24: 6' (1.829 m).   Weight as of 01/02/24: 133.8 kg.   Imaging Review Plain radiographs demonstrate severe degenerative joint disease of the left hip(s). The bone quality appears to be adequate for age and reported activity level.      Assessment/Plan:  End stage arthritis, left  hip(s)  The patient history, physical examination, clinical judgement of the provider and imaging studies are consistent with end stage degenerative joint disease of the left hip(s) and total hip arthroplasty  is deemed medically necessary. The treatment options including medical management, injection therapy, arthroscopy and arthroplasty were discussed at length. The risks and benefits of total hip arthroplasty were presented and reviewed. The risks due to aseptic loosening, infection, stiffness, dislocation/subluxation,  thromboembolic complications and other imponderables were discussed.  The patient acknowledged the explanation, agreed to proceed with the plan and consent was signed. Patient is being admitted for inpatient treatment for surgery, pain control, PT, OT, prophylactic antibiotics, VTE prophylaxis, progressive ambulation and ADL's and discharge planning.The patient is planning to be discharged  home.   Kim Pen, PA-C Orthopedic Surgery [facility] [place] [phone]

## 2024-01-15 NOTE — Op Note (Signed)
 NAME:  Marvin Carr                ACCOUNT NO.: [id number]      MEDICAL RECORD NO.: [id number]      FACILITY:  [facility]      PHYSICIAN:  Bevin Bucks  DATE OF BIRTH:  Nov 05, 1955     DATE OF PROCEDURE:  01/15/2024                                 OPERATIVE REPORT         PREOPERATIVE DIAGNOSIS: Left  hip osteoarthritis.      POSTOPERATIVE DIAGNOSIS:  Left hip osteoarthritis.      PROCEDURE:  Left total hip replacement through an anterior approach   utilizing DePuy THR system, component size 56 mm pinnacle cup, a size 36+4 neutral   Altrex liner, a size 5 Hi Actis stem with a 36+5 delta ceramic   ball.      SURGEON:  Azalea Lento. Bernard Brick, M.D.      ASSISTANT:  Kim Pen, PA-C     ANESTHESIA:  Spinal.      SPECIMENS:  None.      COMPLICATIONS:  None.      BLOOD LOSS:  750 cc     DRAINS:  None.      INDICATION OF THE PROCEDURE:  Marvin Carr is a 68 y.o. male who had   presented to office for evaluation of left hip pain.  Radiographs revealed   progressive degenerative changes with bone-on-bone   articulation of the  hip joint, including subchondral cystic changes and osteophytes.  The patient had painful limited range of   motion significantly affecting their overall quality of life and function.  The patient was failing to    respond to conservative measures including medications and/or injections and activity modification and at this point was ready   to proceed with more definitive measures.  Consent was obtained for   benefit of pain relief.  Specific risks of infection, DVT, component   failure, dislocation, neurovascular injury, and need for revision surgery were reviewed in the office.     PROCEDURE IN DETAIL:  The patient was brought to operative theater.   Once adequate anesthesia, preoperative antibiotics, 3 gm of Ancef , 1 gm of Tranexamic Acid, and 10 mg of Decadron  were administered, the patient was positioned supine on the [employer]  table.  Once the patient was safely positioned with adequate padding of boney prominences we predraped out the hip, and used fluoroscopy to confirm orientation of the pelvis.      The left hip was then prepped and draped from proximal iliac crest to   mid thigh with a shower curtain technique.      Time-out was performed identifying the patient, planned procedure, and the appropriate extremity.     An incision was then made 2 cm lateral to the   anterior superior iliac spine extending over the orientation of the   tensor fascia lata muscle and sharp dissection was carried down to the   fascia of the muscle.      The fascia was then incised.  The muscle belly was identified and swept   laterally and retractor placed along the superior neck.  Following   cauterization of the circumflex vessels and removing some pericapsular   fat, a second cobra retractor was placed on the inferior neck.  A T-capsulotomy was made along the line of the   superior neck to the trochanteric fossa, then extended proximally and   distally.  Tag sutures were placed and the retractors were then placed   intracapsular.  We then identified the trochanteric fossa and   orientation of my neck cut and then made a neck osteotomy with the femur on traction.  The femoral   head was removed without difficulty or complication.  Traction was let   off and retractors were placed posterior and anterior around the   acetabulum.      The labrum and foveal tissue were debrided.  I began reaming with a 49 mm   reamer and reamed up to 55 mm reamer with good bony bed preparation and a 56 mm  cup was chosen.  The final 56 mm Pinnacle cup was then impacted under fluoroscopy to confirm the depth of penetration and orientation with respect to   Abduction and forward flexion.  A screw was placed into the ilium followed by the hole eliminator.  The final   36+4 neutral Altrex liner was impacted with good visualized rim fit.  The cup was  positioned anatomically within the acetabular portion of the pelvis.      At this point, the femur was rolled to 100 degrees.  Further capsule was   released off the inferior aspect of the femoral neck.  I then   released the superior capsule proximally.  With the leg in a neutral position the hook was placed laterally   along the femur under the vastus lateralis origin and elevated manually and then held in position using the hook attachment on the bed.  The leg was then extended and adducted with the leg rolled to 100   degrees of external rotation.  Retractors were placed along the medial calcar and posteriorly over the greater trochanter.  Once the proximal femur was fully   exposed, I used a box osteotome to set orientation.  I then began   broaching with the starting chili pepper broach and passed this by hand and then broached up to 5.  With the 5 broach in place I chose a high offset neck and did several trial reductions.  The offset was appropriate, leg lengths   appeared to be equal best matched with the +5 head ball trial confirmed radiographically.   Given these findings, I went ahead and dislocated the hip, repositioned all   retractors and positioned the right hip in the extended and abducted position.  The final 5 Hi Actis stem was   chosen and it was impacted down to the level of neck cut.  Based on this   and the trial reductions, a final 36+5 delta ceramic ball was chosen and   impacted onto a clean and dry trunnion, and the hip was reduced.  The   hip had been irrigated throughout the case again at this point.  I did   reapproximate the superior capsular leaflet to the anterior leaflet   using #1 Vicryl.  The fascia of the   tensor fascia lata muscle was then reapproximated using #1 Vicryl and #0 Stratafix sutures.  The   remaining wound was closed with 2-0 Vicryl and running 4-0 Monocryl.   The hip was cleaned, dried, and dressed sterilely using Dermabond and   Aquacel  dressing.  The patient was then brought   to recovery room in stable condition tolerating the procedure well.    Kim Pen, PA-C  was present for the entirety of the case involved from   preoperative positioning, perioperative retractor management, general   facilitation of the case, as well as primary wound closure as assistant.            Azalea Lento Bernard Brick, M.D.        01/15/2024 11:02 AM

## 2024-01-15 NOTE — Interval H&P Note (Signed)
 History and Physical Interval Note:  01/15/2024 11:01 AM  Marvin Carr  has presented today for surgery, with the diagnosis of Left hip osteoarthritis.  The various methods of treatment have been discussed with the patient and family. After consideration of risks, benefits and other options for treatment, the patient has consented to  Procedure(s): ARTHROPLASTY, HIP, TOTAL, ANTERIOR APPROACH (Left) as a surgical intervention.  The patient's history has been reviewed, patient examined, no change in status, stable for surgery.  I have reviewed the patient's chart and labs.  Questions were answered to the patient's satisfaction.     Bevin Bucks

## 2024-01-15 NOTE — Anesthesia Postprocedure Evaluation (Signed)
 Anesthesia Post Note  Patient: Marvin Carr  Procedure(s) Performed: ARTHROPLASTY, HIP, TOTAL, ANTERIOR APPROACH (Left: Hip)     Patient location during evaluation: PACU Anesthesia Type: Spinal Level of consciousness: awake Pain management: pain level controlled Vital Signs Assessment: post-procedure vital signs reviewed and stable Respiratory status: spontaneous breathing, nonlabored ventilation and respiratory function stable Cardiovascular status: blood pressure returned to baseline and stable Postop Assessment: no apparent nausea or vomiting Anesthetic complications: no   No notable events documented.  Last Vitals:  Vitals:   01/15/24 1515 01/15/24 1530  BP: 110/70 104/65  Pulse: 73 80  Resp: 12 16  Temp:  36.5 C  SpO2: 96% 95%    Last Pain:  Vitals:   01/15/24 1530  TempSrc:   PainSc: 0-No pain                 Jaeleah Smyser P Martin Smeal

## 2024-01-16 ENCOUNTER — Encounter: Payer: Self-pay | Admitting: Hematology

## 2024-01-16 ENCOUNTER — Telehealth (HOSPITAL_COMMUNITY): Payer: Self-pay | Admitting: Pharmacy Technician

## 2024-01-16 ENCOUNTER — Other Ambulatory Visit (HOSPITAL_COMMUNITY): Payer: Self-pay

## 2024-01-16 ENCOUNTER — Encounter (HOSPITAL_COMMUNITY): Payer: Self-pay | Admitting: Orthopedic Surgery

## 2024-01-16 DIAGNOSIS — M1612 Unilateral primary osteoarthritis, left hip: Secondary | ICD-10-CM | POA: Diagnosis not present

## 2024-01-16 LAB — BASIC METABOLIC PANEL WITH GFR
Anion gap: 11 (ref 5–15)
BUN: 36 mg/dL — ABNORMAL HIGH (ref 8–23)
CO2: 23 mmol/L (ref 22–32)
Calcium: 8.5 mg/dL — ABNORMAL LOW (ref 8.9–10.3)
Chloride: 97 mmol/L — ABNORMAL LOW (ref 98–111)
Creatinine, Ser: 1.34 mg/dL — ABNORMAL HIGH (ref 0.61–1.24)
GFR, Estimated: 58 mL/min — ABNORMAL LOW (ref >60)
Glucose, Bld: 155 mg/dL — ABNORMAL HIGH (ref 70–99)
Potassium: 4 mmol/L (ref 3.5–5.1)
Sodium: 131 mmol/L — ABNORMAL LOW (ref 135–145)

## 2024-01-16 LAB — CBC
HCT: 30.3 % — ABNORMAL LOW (ref 39.0–52.0)
Hemoglobin: 10 g/dL — ABNORMAL LOW (ref 13.0–17.0)
MCH: 30.3 pg (ref 26.0–34.0)
MCHC: 33 g/dL (ref 30.0–36.0)
MCV: 91.8 fL (ref 80.0–100.0)
Platelets: 211 10*3/uL (ref 150–400)
RBC: 3.3 MIL/uL — ABNORMAL LOW (ref 4.22–5.81)
RDW: 15.9 % — ABNORMAL HIGH (ref 11.5–15.5)
WBC: 10.6 10*3/uL — ABNORMAL HIGH (ref 4.0–10.5)
nRBC: 0 % (ref 0.0–0.2)

## 2024-01-16 LAB — GLUCOSE, CAPILLARY
Glucose-Capillary: 202 mg/dL — ABNORMAL HIGH (ref 70–99)
Glucose-Capillary: 192 mg/dL — ABNORMAL HIGH (ref 70–99)

## 2024-01-16 MED ORDER — CEFADROXIL 500 MG PO CAPS: 500.0000 mg | ORAL_CAPSULE | Freq: Two times a day (BID) | ORAL | 0 refills | Status: AC

## 2024-01-16 MED ORDER — ASPIRIN 81 MG PO CHEW: 81.0000 mg | CHEWABLE_TABLET | Freq: Two times a day (BID) | ORAL | 0 refills | Status: DC

## 2024-01-16 MED ORDER — RIVAROXABAN 10 MG PO TABS: 10.0000 mg | ORAL_TABLET | Freq: Every day | ORAL | 0 refills | Status: DC

## 2024-01-16 MED ORDER — POLYETHYLENE GLYCOL 3350 17 G PO PACK: 17.0000 g | PACK | Freq: Two times a day (BID) | ORAL | 0 refills | Status: DC

## 2024-01-16 MED ORDER — OXYCODONE HCL 5 MG PO TABS: 5.0000 mg | ORAL_TABLET | ORAL | 0 refills | Status: DC | PRN

## 2024-01-16 MED ORDER — ACETAMINOPHEN 500 MG PO TABS: 1000.0000 mg | ORAL_TABLET | Freq: Four times a day (QID) | ORAL | Status: DC

## 2024-01-16 MED ORDER — ASPIRIN 81 MG PO CHEW: 81.0000 mg | CHEWABLE_TABLET | Freq: Two times a day (BID) | ORAL | 0 refills | Status: AC

## 2024-01-16 MED ORDER — SENNA 8.6 MG PO TABS: 2.0000 | ORAL_TABLET | Freq: Every day | ORAL | 0 refills | Status: AC

## 2024-01-16 MED ORDER — TIZANIDINE HCL 4 MG PO TABS: 4.0000 mg | ORAL_TABLET | Freq: Three times a day (TID) | ORAL | 1 refills | Status: DC | PRN

## 2024-01-16 NOTE — Progress Notes (Signed)
 Pt discharged home with friends in stable condition. Discharge instructions given. Scripts sent to pharmacy of choice. No immediate questions or concerns at this time.

## 2024-01-16 NOTE — TOC Transition Note (Signed)
 Transition of Care [facility]) - Discharge Note   Patient Details  Name: Marvin Carr MRN: [id number] Date of Birth: 1956/03/20  Transition of Care [facility]) CM/SW Contact:  Bari Leys, RN Phone Number: 01/16/2024, 11:22 AM   Clinical Narrative:   Met with patient at bedside to review dc therapy and home equipment needs, patient confirmed HEP, [employer] to deliver RW to bedside. No TOC needs. MOON completed.     Final next level of care: OP Rehab     Patient Goals and CMS Choice Patient states their goals for this hospitalization and ongoing recovery are:: return home          Discharge Placement                       Discharge Plan and Services Additional resources added to the After Visit Summary for                  DME Arranged: Walker rolling DME Agency: [employer]                  Social Drivers of Health (SDOH) Interventions SDOH Screenings   Food Insecurity: No Food Insecurity (01/15/2024)  Housing: Low Risk  (01/15/2024)  Transportation Needs: No Transportation Needs (01/15/2024)  Utilities: Not At Risk (01/15/2024)  Social Connections: Moderately Isolated (01/15/2024)  Tobacco Use: Medium Risk (01/15/2024)     Readmission Risk Interventions    06/05/2022    8:38 AM  Readmission Risk Prevention Plan  Transportation Screening Complete  Home Care Screening Complete  Medication Review (RN CM) Complete

## 2024-01-16 NOTE — Progress Notes (Signed)
 PT TX NOTE  01/16/24 1500  PT Visit Information  Last PT Received On 01/16/24  Assistance Needed Pt continues to make excellent progress. Noted incr stride length and improving fluidity of gait this pm. Reviewed up/down stairs with single handrail and supervision for safety. HEP and progression reviewed, handout provided. Anticipate continued progress post acute. Pt is ready to d/c from PT standpoint  History of Present Illness 68 yo male s/p L DA THA on 01/15/24. 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, ongoing chemo, neuropathy, depression, CAD,  HTN  Subjective Data  Patient Stated Goal improve LLE function  Precautions  Precautions Fall  Recall of Precautions/Restrictions Intact  Restrictions  Weight Bearing Restrictions Per Provider Order No  LLE Weight Bearing Per Provider Order WBAT  Pain Assessment  Pain Assessment 0-10  Pain Score 4  Pain Location L hip  Pain Descriptors / Indicators Tightness  Pain Intervention(s) Limited activity within patient's tolerance;Monitored during session;Premedicated before session  Cognition  Arousal Alert  Behavior During Therapy WFL for tasks assessed/performed  PT - Cognitive impairments No apparent impairments  Following Commands  Following commands Intact  Cueing  Cueing Techniques Verbal cues  Communication  Communication No apparent difficulties  Bed Mobility  General bed mobility comments pt on window seat getting dressed  Transfers  Overall transfer level Needs assistance  Equipment used Rolling walker (2 wheels)  Transfers Sit to/from Stand  Sit to Stand Supervision;Modified independent (Device/Increase time)  Ambulation/Gait  Ambulation/Gait assistance Supervision;Modified independent (Device/Increase time)  Gait Distance (Feet) 180 Feet  Assistive device Rolling walker (2 wheels)  Gait Pattern/deviations Step-to pattern;Step-through pattern;Wide base of support  General Gait Details step through with good stability, incr  stride length and very light reliance on RW  Stairs assistance Supervision  Stair Management One rail Right;One rail Left;Step to pattern;Sideways  Number of Stairs 3  General stair comments cues for sequence and technique. no LOB  Balance  Sitting-balance support Feet supported;No upper extremity supported  Sitting balance-Leahy Scale Normal  Standing balance-Leahy Scale Fair  Total Joint Exercises  Ankle Circles/Pumps AROM;Both;5 reps  Long Arc Quad AROM;Left;10 reps;Seated  PT - End of Session  Equipment Utilized During Treatment Gait belt  Activity Tolerance Patient tolerated treatment well  Patient left with call bell/phone within reach;in bed   PT - Assessment/Plan  PT Visit Diagnosis Difficulty in walking, not elsewhere classified (R26.2)  PT Frequency (ACUTE ONLY) 7X/week  Follow Up Recommendations Follow physician's recommendations for discharge plan and follow up therapies  Patient can return home with the following Assistance with cooking/housework;Assist for transportation;Help with stairs or ramp for entrance;A little help with bathing/dressing/bathroom  PT equipment Rolling walker (2 wheels)  AM-PAC PT "6 Clicks" Mobility Outcome Measure (Version 2)  Help needed turning from your back to your side while in a flat bed without using bedrails? 3  Help needed moving from lying on your back to sitting on the side of a flat bed without using bedrails? 3  Help needed moving to and from a bed to a chair (including a wheelchair)? 3  Help needed standing up from a chair using your arms (e.g., wheelchair or bedside chair)? 3  Help needed to walk in hospital room? 3  Help needed climbing 3-5 steps with a railing?  3  6 Click Score 18  Consider Recommendation of Discharge To: Home with [facility]  PT Goal Progression  Progress towards PT goals Progressing toward goals  Acute Rehab PT Goals  PT Goal Formulation With patient  Time  For Goal Achievement 01/29/24  Potential to Achieve Goals  Good  PT Time Calculation  PT Start Time (ACUTE ONLY) 1409  PT Stop Time (ACUTE ONLY) 1427  PT Time Calculation (min) (ACUTE ONLY) 18 min  PT General Charges  $$ ACUTE PT VISIT 1 Visit  PT Treatments  $Gait Training 8-22 mins

## 2024-01-16 NOTE — Care Management Obs Status (Signed)
 MEDICARE OBSERVATION STATUS NOTIFICATION   Patient Details  Name: Marvin Carr MRN: [id number] Date of Birth: 04-03-1956   Medicare Observation Status Notification Given:  Yes    Bari Leys, RN 01/16/2024, 9:32 AM

## 2024-01-16 NOTE — Care Management Obs Status (Signed)
 MEDICARE OBSERVATION STATUS NOTIFICATION   Patient Details  Name: Marvin Carr MRN: [id number] Date of Birth: 10/08/1955   Medicare Observation Status Notification Given:  Yes    Bari Leys, RN 01/16/2024, 9:22 AM

## 2024-01-16 NOTE — Telephone Encounter (Addendum)
 Patient Product/process development scientist completed.    The patient is insured through [employer]. Patient has Medicare and is not eligible for a copay card, but may be able to apply for patient assistance or Medicare RX Payment Plan (Patient Must reach out to their plan, if eligible for payment plan), if available.    Ran test claim for Eliquis 2.5 mg and the current 30 day co-pay is $367.14 due to a $585.00 deductible.  Ran test claim for Xarelto 10 mg and the current 30 day co-pay is $365.56 due to a $585.00 deductible.  This test claim was processed through [facility]- copay amounts may vary at other pharmacies due to pharmacy/plan contracts, or as the patient moves through the different stages of their insurance plan.     Morgan Arab, CPHT Pharmacy Technician III Certified Patient Advocate [facility] Pharmacy Patient Advocate Team Direct Number: [phone]  Fax: [phone]

## 2024-01-16 NOTE — Progress Notes (Signed)
 Physical Therapy Treatment Patient Details Name: Marvin Carr MRN: [id number] DOB: [birth year] Today's Date: 01/16/2024   History of Present Illness 68 yo male s/p L DA THA on 01/15/24. 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 Will see again in pm to reviewed THA HEP and pt should be ready to d/c later today     If plan is discharge home, recommend the following: Assistance with cooking/housework;Assist for transportation;Help with stairs or ramp for entrance;A little help with bathing/dressing/bathroom   Can travel by private vehicle        Equipment Recommendations  Rolling walker (2 wheels)    Recommendations for Other Services       Precautions / Restrictions Precautions Precautions: Fall Recall of Precautions/Restrictions: Intact Restrictions Weight Bearing Restrictions Per Provider Order: No LLE Weight Bearing Per Provider Order: Weight bearing as tolerated     Mobility  Bed Mobility Overal bed mobility: Modified Independent Bed Mobility: Supine to Sit     Supine to sit: HOB elevated, Used rails          Transfers Overall transfer level: Needs assistance Equipment used: Rolling walker (2 wheels) Transfers: Sit to/from Stand Sit to Stand: Supervision           General transfer comment: cues for hand placement and safety    Ambulation/Gait Ambulation/Gait assistance: Supervision Gait Distance (Feet): 150 Feet Assistive device: Rolling walker (2 wheels) Gait Pattern/deviations: Step-to pattern, Step-through pattern, Wide base of support       General Gait Details: cues for initial sequence. progression to step through with good stability   Stairs Stairs: Yes Stairs assistance: Contact guard assist, Supervision Stair Management: One rail Right, One rail Left, Step to pattern, Sideways Number of Stairs: 3 General stair comments: cues for  sequence and technique. no LOB   Wheelchair Mobility     Tilt Bed    Modified [name] (Stroke Patients Only)       Balance   Sitting-balance support: Feet supported, No upper extremity supported Sitting balance-Leahy Scale: Normal       Standing balance-Leahy Scale: Fair                              Hotel manager: No apparent difficulties  Cognition Arousal: Alert Behavior During Therapy: WFL for tasks assessed/performed   PT - Cognitive impairments: No apparent impairments                         Following commands: Intact      Cueing Cueing Techniques: Verbal cues  Exercises      General Comments        Pertinent Vitals/Pain Pain Assessment Pain Assessment: 0-10 Pain Location: L hip Pain Descriptors / Indicators: Tightness Pain Intervention(s): Limited activity within patient's tolerance, Monitored during session, Premedicated before session, Repositioned    Home Living                          Prior Function            PT Goals (current goals can now be found in the care plan section) Acute Rehab PT Goals Patient Stated Goal: improve LLE function PT Goal Formulation: With patient Time For Goal Achievement: 01/29/24 Potential to Achieve Goals: Good Progress towards PT goals: Progressing toward goals  Frequency    7X/week      PT Plan      Co-evaluation              AM-PAC PT "6 Clicks" Mobility   Outcome Measure  Help needed turning from your back to your side while in a flat bed without using bedrails?: A Little Help needed moving from lying on your back to sitting on the side of a flat bed without using bedrails?: A Little Help needed moving to and from a bed to a chair (including a wheelchair)?: A Little Help needed standing up from a chair using your arms (e.g., wheelchair or bedside chair)?: A Little Help needed to walk in hospital room?: A Little Help needed  climbing 3-5 steps with a railing? : A Little 6 Click Score: 18    End of Session Equipment Utilized During Treatment: Gait belt Activity Tolerance: Patient tolerated treatment well Patient left: in chair;with call bell/phone within reach;with chair alarm set   PT Visit Diagnosis: Difficulty in walking, not elsewhere classified (R26.2)     Time: 3664-4034 PT Time Calculation (min) (ACUTE ONLY): 20 min  Charges:    $Gait Training: 8-22 mins PT General Charges $$ ACUTE PT VISIT: 1 Visit                     Dejay Kronk, PT  Acute Rehab Dept [facility]) [phone]  01/16/2024    [facility] 01/16/2024, 10:36 AM

## 2024-01-16 NOTE — Progress Notes (Signed)
   Subjective: 1 Day Post-Op Procedure(s) (LRB): ARTHROPLASTY, HIP, TOTAL, ANTERIOR APPROACH (Left) Patient reports pain as mild.   Patient seen in rounds by Dr. Bernard Brick. Patient is resting in bed on exam this morning. No acute events overnight. Foley catheter removed. Patient ambulated a few feet with PT yesterday. We will start therapy today.   Objective: Vital signs in last 24 hours: Temp:  [97.4 F (36.3 C)-98 F (36.7 C)] 97.6 F (36.4 C) (05/07 0509) Pulse Rate:  [66-95] 82 (05/07 0509) Resp:  [12-20] 18 (05/07 0509) BP: (87-126)/(51-78) 109/65 (05/07 0509) SpO2:  [92 %-100 %] 92 % (05/07 0509) Weight:  [133.8 kg] 133.8 kg (05/06 1011)  Intake/Output from previous day:  Intake/Output Summary (Last 24 hours) at 01/16/2024 0736 Last data filed at 01/16/2024 0651 Gross per 24 hour  Intake 1771.53 ml  Output 2500 ml  Net -728.47 ml     Intake/Output this shift: No intake/output data recorded.  Labs: Recent Labs    01/16/24 0335  HGB 10.0*   Recent Labs    01/16/24 0335  WBC 10.6*  RBC 3.30*  HCT 30.3*  PLT 211   Recent Labs    01/16/24 0335  NA 131*  K 4.0  CL 97*  CO2 23  BUN 36*  CREATININE 1.34*  GLUCOSE 155*  CALCIUM  8.5*   No results for input(s): "LABPT", "INR" in the last 72 hours.  Exam: General - Patient is Alert and Oriented Extremity - Neurologically intact Sensation intact distally Intact pulses distally Dorsiflexion/Plantar flexion intact Dressing - dressing C/D/I Motor Function - intact, moving foot and toes well on exam.   Past Medical History:  Diagnosis Date   Anxiety    Arthritis    Bladder cancer (HCC) 09/11/2009   CAD (coronary artery disease), native coronary artery    a. Mildly elevated troponin 03/2013, cath with nonobstructive disease including 50% AV groove distal stenosis before large OM   Depression    Essential hypertension    Headache(784.0)    History of migraines   Hyperglycemia    Mixed hyperlipidemia     Neuropathy    Obesity    Port-A-Cath in place 09/30/2021   Pre-diabetes    Seasonal allergies    Sleep apnea    On CPAP    Assessment/Plan: 1 Day Post-Op Procedure(s) (LRB): ARTHROPLASTY, HIP, TOTAL, ANTERIOR APPROACH (Left) Principal Problem:   S/P total left hip arthroplasty Active Problems:   S/p revision of left total hip  Estimated body mass index is 40.01 kg/m as calculated from the following:   Height as of this encounter: 6' (1.829 m).   Weight as of this encounter: 133.8 kg. Advance diet Up with therapy D/C IV fluids  DVT Prophylaxis - Xarelto 10 mg daily x 3 weeks, then aspirin  81 mg BID x 3 weeks Weight bearing as tolerated.  Abx x 10 days due to BMI and underlying metastatic disease with chemo  Hgb stable at 10 this AM  Plan is to go Home after hospital stay. Plan for discharge today after meeting goals with therapy. Follow up in the office in 2 weeks.   Kim Pen, PA-C Orthopedic Surgery [phone] 01/16/2024, 7:36 AM

## 2024-01-17 ENCOUNTER — Inpatient Hospital Stay

## 2024-01-17 ENCOUNTER — Inpatient Hospital Stay: Admitting: Hematology

## 2024-01-22 NOTE — Patient Instructions (Signed)
 Below is our plan:  We will continue to monitor. If black out spells return, please ask Dr Del Favia to sen a referra to our office for eval. Try Flonase 30 minutes prior to CPAP use.   Please continue using your CPAP regularly. While your insurance requires that you use CPAP at least 4 hours each night on 70% of the nights, I recommend, that you not skip any nights and use it throughout the night if you can. Getting used to CPAP and staying with the treatment long term does take time and patience and discipline. Untreated obstructive sleep apnea when it is moderate to severe can have an adverse impact on cardiovascular health and raise her risk for heart disease, arrhythmias, hypertension, congestive heart failure, stroke and diabetes. Untreated obstructive sleep apnea causes sleep disruption, nonrestorative sleep, and sleep deprivation. This can have an impact on your day to day functioning and cause daytime sleepiness and impairment of cognitive function, memory loss, mood disturbance, and problems focussing. Using CPAP regularly can improve these symptoms.  Please make sure you are staying well hydrated. I recommend 50-60 ounces daily. Well balanced diet and regular exercise encouraged. Consistent sleep schedule with 6-8 hours recommended.   Please continue follow up with care team as directed.   Follow up with me in 4-6 months   You may receive a survey regarding today's visit. I encourage you to leave honest feed back as I do use this information to improve patient care. Thank you for seeing me today!

## 2024-01-22 NOTE — Progress Notes (Unsigned)
 PATIENT: Marvin Carr DOB: 20-Feb-1956  REASON FOR VISIT: follow up HISTORY FROM: patient  Dr Gracie Lav: RLS Dr Felisa Hose: sleep   No chief complaint on file.   Gracie Lav: RLS  Marvin Carr: sleep  HISTORY OF PRESENT ILLNESS:  01/22/24 ALL:  Sharin David returns for follow up for OSA on CPAP and RLS. He was last seen by Dr Albertina Hugger 06/2023 and reported doing better on CPAP. He was seen by pulmonology 09/2023. O2 not needed based on walking test.   Since,   05/21/2023 ALL:  [name] is a 68 y.o. male here today for follow up for OSA on CPAP. Previously failed CPAP years ago. He is followed by Dr Gracie Lav for RLS on pramapexiole and gabapentin . He was seen in consult with Dr Albertina Hugger 02/2023. HST showed severe OSA with total AHI 75.7/hr and O2 nadir of 75%. He started therapy 04/12/2023. He has had difficulty tolerating therapy. He is using a [employer] style FFM. He has tried nasal pillow style mask in the past. He wakes often to use restroom. He has nocturia and edema related to chemotherapy. He has an oxygen concentrator from [employer] that he uses with CPAP. He feels that he does better with oxygen. He is able to get a full deep breath. He continues zaleplon  10mg  every night. He takes tramadol  4 times daily for back pain.   He does feel RLS has improved. He continues Mirapex  3-4 times daily. He tried switching gabapentin  to 1200mg  TID but felt off. He has titrated off of gabapentin . He does not feel it is needed at this time.      HISTORY: (copied from Dr Marvin Carr's previous note)  Travarus Cerros is a 68 y.o. male patient who is seen upon referral on 02/22/2023 from Dr Gracie Lav  for a Sleep consultation.  Chief concern :  "Marvin Carr is a 68 y.o. male and established  patient of dr Gracie Lav , who has presented to her on 01-29-2023:   Long history of restless leg syndrome Peripheral neuropathy,             Under okay control, but has developed augmentation, taking higher dose of Mirapex  0.75 mg 3 times a day,              Advised him to push back Mirapex  to 0.75 mg 1 to 2 tablets at nighttime only,             During the day, may take higher dose of gabapentin  up to 300 mg 2 tablets 4 times a day as needed             He has developed bilateral feet paresthesia during his chemotherapy for bile duct carcinoma, continue to progress, not a good candidate for nerve conduction study due to bilateral lower extremity pitting edema, skin wound,             Iron level".    I have the pleasure of seeing Marvin Carr 02/22/23 a right-handed male with a possible sleep disorder. He has had sleep studies in [place] and at [facility]. Had a study with dr Joleen Navy and no results posted, 2014. Apparently no diagnosis of a sleep disorder 10 years ago. He was offered a trial of CPAP, but failed. ONO with PCP indicated borderline hypoxic events, oxygen trial failed as well.  He has chronic insomnia and takes Lunesta, as provided by Marvin Rusk, MD. He is in oncology treatment and there was prescribed sonata .  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 This is helped by Mirapex .  Was seen in the wound clinic in [place].   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 He lives in a household  alone , with 2 cats. . Family status is divorced , without children, The patient currently works "gigs"  Tobacco use: none .  ETOH use ; in the past,  Caffeine intake in form of Coffee( 2 cups a day) Soda( /) Tea ( /) or energy drinks Exercise in form of walking is limited.    Sleep habits are as follows: The patient's dinner time is between 6-7 PM.  The patient goes to bed at 11-2 AM and continues to sleep for intervals of 2 hours, wakes from unknown causes.  Not from pain, not SOB.  The preferred sleep position is laterally  but edema is interfering, legs elevated now in recliner. Supine now. , with the support of 1-2 pillows.  Dreams are reportedly frequent.  The patient wakes up spontaneously before 9  AM is the usual rise time. He reports not  always feeling refreshed or restored in AM, with symptoms such as dry mouth, morning headaches, and residual fatigue.  Headaches are better  when sleeping in the recliner.  Unintended Naps are taken frequently, lasting from 10 to 30 minutes and are more refreshing than nocturnal sleep.    REVIEW OF SYSTEMS: Out of a complete 14 system review of symptoms, the patient complains only of the following symptoms, and all other reviewed systems are negative.  ESS:  ALLERGIES: No Known Allergies  HOME MEDICATIONS: Outpatient Medications Prior to Visit  Medication Sig Dispense Refill   acetaminophen  (TYLENOL ) 500 MG tablet Take 2 tablets (1,000 mg total) by mouth every 6 (six) hours.     amLODipine  (NORVASC ) 10 MG tablet Take 1 tablet (10 mg total) by mouth daily. 90 tablet 3   aspirin  (ASPIRIN  CHILDRENS) 81 MG chewable tablet Chew 1 tablet (81 mg total) by mouth in the morning and at bedtime. 84 tablet 0   bumetanide  (BUMEX ) 2 MG tablet Take 1 tablet (2 mg total) by mouth daily as needed. (Patient taking differently: Take 2 mg by mouth daily.)     buPROPion  (WELLBUTRIN  XL) 300 MG 24 hr tablet Take 300 mg by mouth daily.     cefadroxil  (DURICEF) 500 MG capsule Take 1 capsule (500 mg total) by mouth 2 (two) times daily for 10 days. 20 capsule 0   cetirizine (ZYRTEC) 10 MG tablet Take 10 mg by mouth daily.     docusate sodium (COLACE) 100 MG capsule Take 100-200 mg by mouth at bedtime as needed for mild constipation.     Durvalumab  (IMFINZI  IV) Inject into the vein every 21 ( twenty-one) days.     Eszopiclone 3 MG TABS Take 3 mg by mouth at bedtime.     famotidine  (PEPCID ) 20 MG tablet Take 20 mg by mouth at bedtime.     fenofibrate  160 MG tablet Take 160 mg by mouth daily.      FLUoxetine  (PROZAC ) 10 MG capsule Take 10 mg by mouth daily.     gabapentin  (NEURONTIN ) 300 MG capsule Take 300 mg by mouth every 6 (six) hours as needed (pain).     guaiFENesin (MUCINEX) 600 MG 12 hr tablet Take 600 mg by mouth  2 (two) times daily as needed for cough or to loosen phlegm.     magnesium  oxide (MAG-OX) 400 (240 Mg) MG tablet Take 1 tablet (400 mg total) by mouth 2 (two) times daily. 90 tablet 6   metFORMIN  (GLUCOPHAGE -XR) 500 MG 24 hr tablet Take 500 mg by mouth 2 (two) times daily.     Multiple Vitamin (MULTIVITAMIN WITH MINERALS) TABS tablet Take 1 tablet by mouth daily.     nitroGLYCERIN  (NITROSTAT ) 0.4 MG SL tablet Place 0.4 mg under the tongue every 5 (five) minutes x 3 doses as needed for chest pain (if no relief after 2nd dose, proceed to ED or call 911).     NON FORMULARY Pt uses a cpap     olmesartan  (BENICAR ) 40 MG tablet TAKE (1) TABLET BY MOUTH ONCE DAILY. 30 tablet 5   oxyCODONE  (OXY IR/ROXICODONE ) 5 MG immediate release tablet Take 1-2 tablets (5-10 mg total) by mouth every 4 (four) hours as needed for severe pain (pain score 7-10). Take 2 tabs only for severe pain 42 tablet 0   polyethylene glycol (MIRALAX  / GLYCOLAX ) 17 g packet Take 17 g by mouth 2 (two) times daily. 14 each 0   pramipexole  (MIRAPEX ) 0.75 MG tablet Take 1 tablet (0.75 mg total) by mouth 3 (three) times daily. 90 tablet 0   prochlorperazine  (COMPAZINE ) 10 MG tablet Take 1 tablet (10 mg total) by mouth every 6 (six) hours as needed for nausea or vomiting. 30 tablet 2   Saw Palmetto, Serenoa repens, (SAW PALMETTO PO) Take 1 capsule by mouth daily.     Semaglutide  14 MG TABS Take 14 mg by mouth daily.     senna (SENOKOT) 8.6 MG TABS tablet Take 2 tablets (17.2 mg total) by mouth at bedtime for 14 days. 28 tablet 0   tamsulosin  (FLOMAX ) 0.4 MG CAPS capsule Take 0.4 mg by mouth daily.     tiZANidine  (ZANAFLEX ) 4 MG tablet Take 1 tablet (4 mg total) by mouth every 8 (eight) hours as needed for muscle  spasms. 40 tablet 1   zaleplon  (SONATA ) 10 MG capsule Take 1 capsule (10 mg total) by mouth at bedtime as needed for sleep. 30 capsule 5   Facility-Administered Medications Prior to Visit  Medication Dose Route Frequency Provider Last Rate Last Admin   magnesium  sulfate 2 GM/50ML IVPB             PAST MEDICAL HISTORY: Past Medical History:  Diagnosis Date   Anxiety    Arthritis    Bladder cancer (HCC) 09/11/2009   CAD (coronary artery disease), native coronary artery    a. Mildly elevated troponin 03/2013, cath with nonobstructive disease including 50% AV groove distal stenosis before large OM   Depression    Essential hypertension    Headache(784.0)    History of migraines   Hyperglycemia    Mixed hyperlipidemia    Neuropathy    Obesity    Port-A-Cath in place 09/30/2021   Pre-diabetes    Seasonal allergies    Sleep apnea    On CPAP    PAST SURGICAL HISTORY: Past Surgical History:  Procedure Laterality Date   BIOPSY  09/23/2021   Procedure: BIOPSY;  Surgeon: Urban Garden, MD;  Location: [facility];  Service: Gastroenterology;;   COLONOSCOPY  06/28/2011   Procedure: COLONOSCOPY;  Surgeon: Ruby Corporal, MD;  Location: [facility];  Service: Endoscopy;  Laterality: N/A;   COLONOSCOPY WITH PROPOFOL  N/A 09/23/2021   Procedure: COLONOSCOPY WITH  PROPOFOL ;  Surgeon: Umberto Ganong, Bearl Limes, MD;  Location: [facility];  Service: Gastroenterology;  Laterality: N/A;  940   ESOPHAGOGASTRODUODENOSCOPY (EGD) WITH PROPOFOL  N/A 09/23/2021   Procedure: ESOPHAGOGASTRODUODENOSCOPY (EGD) WITH PROPOFOL ;  Surgeon: Urban Garden, MD;  Location: [facility];  Service: Gastroenterology;  Laterality: N/A;   IR IMAGING GUIDED PORT INSERTION  09/28/2021   IR PARACENTESIS  09/28/2021   JOINT REPLACEMENT Right    hip   LEFT HEART CATHETERIZATION WITH CORONARY ANGIOGRAM N/A 03/31/2013   Procedure: LEFT HEART CATHETERIZATION WITH CORONARY ANGIOGRAM;  Surgeon: Eilleen Grates, MD;  Location: [facility] C[facility];  Service: Cardiovascular;  Laterality: N/A;   POLYPECTOMY  09/23/2021   Procedure: POLYPECTOMY;  Surgeon: Urban Garden, MD;  Location: [facility];  Service: Gastroenterology;;   TOTAL HIP ARTHROPLASTY Left 01/15/2024   Procedure: ARTHROPLASTY, HIP, TOTAL, ANTERIOR APPROACH;  Surgeon: Claiborne Crew, MD;  Location: [facility];  Service: Orthopedics;  Laterality: Left;   TURBT  09/11/2009    FAMILY HISTORY: Family History  Problem Relation Age of Onset   COPD Father     SOCIAL HISTORY: Social History   Socioeconomic History   Marital status: Divorced    Spouse name: Not on file   Number of children: 0   Years of education: college   Highest education level: Not on file  Occupational History    Employer: SELF-EMPLOYED  Tobacco Use   Smoking status: Former    Current packs/day: 0.00    Average packs/day: 0.5 packs/day for 10.0 years (5.0 ttl pk-yrs)    Types: Cigarettes    Start date: 07/09/2001    Quit date: 07/10/2011    Years since quitting: 12.5   Smokeless tobacco: Never   Tobacco comments:    Quit several yrs prior to 03/2013.  Vaping Use   Vaping status: Never Used  Substance and Sexual Activity   Alcohol use: Yes    Alcohol/week: 0.0 standard drinks of alcohol    Comment: Occasional   Drug use: No   Sexual activity: Not on file  Other Topics Concern   Not on file  Social History Narrative   Not on file   Social Drivers of Health   Financial Resource Strain: Not on file  Food Insecurity: No Food Insecurity (01/15/2024)   Hunger Vital Sign    Worried About Running Out of Food in the Last Year: Never true    Ran Out of Food in the Last Year: Never true  Transportation Needs: No Transportation Needs (01/15/2024)   PRAPARE - Administrator, Civil Service (Medical): No    Lack of Transportation (Non-Medical): No  Physical Activity: Not on file  Stress: Not on file  Social Connections: Moderately Isolated  (01/15/2024)   Social Connection and Isolation Panel [NHANES]    Frequency of Communication with Friends and Family: More than three times a week    Frequency of Social Gatherings with Friends and Family: More than three times a week    Attends Religious Services: More than 4 times per year    Active Member of [employer] or Organizations: No    Attends Banker Meetings: Never    Marital Status: Divorced  Catering manager Violence: Not At Risk (01/15/2024)   Humiliation, Afraid, Rape, and Kick questionnaire    Fear of Current or Ex-Partner: No    Emotionally Abused: No    Physically Abused: No    Sexually Abused: No     PHYSICAL EXAM  There were no vitals filed for this visit.  There is no height or weight on file to calculate BMI.  Generalized: Well developed, in no acute distress  Cardiology: normal rate and rhythm, no murmur noted Respiratory: clear to auscultation bilaterally  Neurological examination  Mentation: Alert oriented to time, place, history taking. Follows all commands speech and language fluent Cranial nerve II-XII: Pupils were equal round reactive to light. Extraocular movements were full, visual field were full  Motor: The motor testing reveals 5 over 5 strength of all 4 extremities. Good symmetric motor tone is noted throughout.  Gait and station: Gait is normal.    DIAGNOSTIC DATA (LABS, IMAGING, TESTING) - I reviewed patient records, labs, notes, testing and imaging myself where available.      No data to display           Lab Results  Component Value Date   WBC 10.6 (H) 01/16/2024   HGB 10.0 (L) 01/16/2024   HCT 30.3 (L) 01/16/2024   MCV 91.8 01/16/2024   PLT 211 01/16/2024      Component Value Date/Time   NA 131 (L) 01/16/2024 0335   K 4.0 01/16/2024 0335   CL 97 (L) 01/16/2024 0335   CO2 23 01/16/2024 0335   GLUCOSE 155 (H) 01/16/2024 0335   BUN 36 (H) 01/16/2024 0335   CREATININE 1.34 (H) 01/16/2024 0335   CALCIUM  8.5 (L)  01/16/2024 0335   PROT 6.3 (L) 12/20/2023 0756   ALBUMIN  3.5 12/20/2023 0756   AST 22 12/20/2023 0756   ALT 35 12/20/2023 0756   ALKPHOS 88 12/20/2023 0756   BILITOT 0.4 12/20/2023 0756   GFRNONAA 58 (L) 01/16/2024 0335   GFRAA >90 06/11/2014 0558   Lab Results  Component Value Date   CHOL 121 07/22/2014   HDL 35 (L) 07/22/2014   LDLCALC 52 07/22/2014   TRIG 169 (H) 07/22/2014   CHOLHDL 3.5 07/22/2014   Lab Results  Component Value Date   HGBA1C 6.0 (H) 01/15/2024   Lab Results  Component Value Date   VITAMINB12 2,800 (H) 01/04/2022   Lab Results  Component Value Date   TSH 2.639 12/06/2023     ASSESSMENT AND PLAN 68 y.o. year old male  has a past medical history of Anxiety, Arthritis, Bladder cancer (HCC) (09/11/2009), CAD (coronary artery disease), native coronary artery, Depression, Essential hypertension, Headache(784.0), Hyperglycemia, Mixed hyperlipidemia, Neuropathy, Obesity, Port-A-Cath in place (09/30/2021), Pre-diabetes, Seasonal allergies, and Sleep apnea. here with     ICD-10-CM   1. Obstructive sleep apnea  G47.33     2. RLS (restless legs syndrome)  G25.81     3. Chemotherapy-induced peripheral neuropathy (HCC)  G62.0    T45.1X5A         Kastyn Steltenpohl continues to adjust to CPAP therapy. Compliance report reveals acceptable daily but sub optimal 4 hour compliance. He is concerned about feeling that it is difficult to breath on therapy and has a hard time maintaining sleep. I will increase EPR to 3cmH20. I will also order and ONO for evaluation of supplemental O2 needs. ESS remains elevated at 18/24. May consider stimulant as indicated pending review with Dr Albertina Hugger. He was encouraged to continue using CPAP nightly and for greater than 4 hours each night. We will update supply orders as indicated. Risks of untreated sleep apnea review and education materials provided. He will continue pramipexole  0.75mg  TID. Healthy lifestyle habits encouraged. He will  follow up with Dr Albertina Hugger in 3-4 months to review ONO, response  to pressure adjustments and need for stimulant. He verbalizes understanding and agreement with this plan.    No orders of the defined types were placed in this encounter.    No orders of the defined types were placed in this encounter.    I spent 30 minutes of face-to-face and non-face-to-face time with patient.  This included previsit chart review, lab review, study review, order entry, electronic health record documentation, patient education.   Terrilyn Fick, FNP-C 01/22/2024, 4:21 PM [facility] [address] [place], [place] [address] [phone]

## 2024-01-22 NOTE — Discharge Summary (Signed)
 Patient ID: Marvin Carr MRN: [id number] DOB/AGE: 1955-10-17 68 y.o.  Admit date: 01/15/2024 Discharge date: 01/16/2024  Admission Diagnoses:  Left hip osteoarthritis  Discharge Diagnoses:  Principal Problem:   S/P total left hip arthroplasty Active Problems:   S/p revision of left total hip   Past Medical History:  Diagnosis Date   Anxiety    Arthritis    Bladder cancer (HCC) 09/11/2009   CAD (coronary artery disease), native coronary artery    a. Mildly elevated troponin 03/2013, cath with nonobstructive disease including 50% AV groove distal stenosis before large OM   Depression    Essential hypertension    Headache(784.0)    History of migraines   Hyperglycemia    Mixed hyperlipidemia    Neuropathy    Obesity    Port-A-Cath in place 09/30/2021   Pre-diabetes    Seasonal allergies    Sleep apnea    On CPAP    Surgeries: Procedure(s): ARTHROPLASTY, HIP, TOTAL, ANTERIOR APPROACH on 01/15/2024   Consultants:   Discharged Condition: Improved  Hospital Course: Jemuel Glessner is an 68 y.o. male who was admitted 01/15/2024 for operative treatment ofS/P total left hip arthroplasty. Patient has severe unremitting pain that affects sleep, daily activities, and work/hobbies. After pre-op clearance the patient was taken to the operating room on 01/15/2024 and underwent  Procedure(s): ARTHROPLASTY, HIP, TOTAL, ANTERIOR APPROACH.    Patient was given perioperative antibiotics:  Anti-infectives (From admission, onward)    Start     Dose/Rate Route Frequency Ordered Stop   01/16/24 0000  cefadroxil  (DURICEF) 500 MG capsule        500 mg Oral 2 times daily 01/16/24 0741 01/26/24 2359   01/15/24 1800  ceFAZolin  (ANCEF ) IVPB 2g/100 mL premix        2 g 200 mL/hr over 30 Minutes Intravenous Every 6 hours 01/15/24 1526 01/15/24 2344   01/15/24 1015  ceFAZolin  (ANCEF ) IVPB 3g/150 mL premix        3 g 300 mL/hr over 30 Minutes Intravenous On call to O.R. 01/15/24 1006 01/15/24 1210         Patient was given sequential compression devices, early ambulation, and chemoprophylaxis to prevent DVT. Patient worked with PT and was meeting their goals regarding safe ambulation and transfers.  Patient benefited maximally from hospital stay and there were no complications.    Recent vital signs: No data found.   Recent laboratory studies: No results for input(s): "WBC", "HGB", "HCT", "PLT", "NA", "K", "CL", "CO2", "BUN", "CREATININE", "GLUCOSE", "INR", "CALCIUM " in the last 72 hours.  Invalid input(s): "PT", "2"   Discharge Medications:   Allergies as of 01/16/2024   No Known Allergies      Medication List     STOP taking these medications    aspirin  EC 81 MG tablet Replaced by: aspirin  81 MG chewable tablet   cyclobenzaprine  10 MG tablet Commonly known as: FLEXERIL    pantoprazole  40 MG tablet Commonly known as: PROTONIX    traMADol  50 MG tablet Commonly known as: ULTRAM        TAKE these medications    acetaminophen  500 MG tablet Commonly known as: TYLENOL  Take 2 tablets (1,000 mg total) by mouth every 6 (six) hours.   amLODipine  10 MG tablet Commonly known as: NORVASC  Take 1 tablet (10 mg total) by mouth daily.   aspirin  81 MG chewable tablet Commonly known as: Aspirin  Childrens Chew 1 tablet (81 mg total) by mouth in the morning and at bedtime. Replaces: aspirin  EC 81 MG tablet  bumetanide  2 MG tablet Commonly known as: BUMEX  Take 1 tablet (2 mg total) by mouth daily as needed. What changed: when to take this   buPROPion  300 MG 24 hr tablet Commonly known as: WELLBUTRIN  XL Take 300 mg by mouth daily.   cefadroxil  500 MG capsule Commonly known as: DURICEF Take 1 capsule (500 mg total) by mouth 2 (two) times daily for 10 days.   cetirizine 10 MG tablet Commonly known as: ZYRTEC Take 10 mg by mouth daily.   docusate sodium 100 MG capsule Commonly known as: COLACE Take 100-200 mg by mouth at bedtime as needed for mild constipation.    Eszopiclone 3 MG Tabs Take 3 mg by mouth at bedtime.   famotidine  20 MG tablet Commonly known as: PEPCID  Take 20 mg by mouth at bedtime.   fenofibrate  160 MG tablet Take 160 mg by mouth daily.   FLUoxetine  10 MG capsule Commonly known as: PROZAC  Take 10 mg by mouth daily.   gabapentin  300 MG capsule Commonly known as: NEURONTIN  Take 300 mg by mouth every 6 (six) hours as needed (pain).   guaiFENesin 600 MG 12 hr tablet Commonly known as: MUCINEX Take 600 mg by mouth 2 (two) times daily as needed for cough or to loosen phlegm.   IMFINZI  IV Inject into the vein every 21 ( twenty-one) days.   magnesium  oxide 400 (240 Mg) MG tablet Commonly known as: MAG-OX Take 1 tablet (400 mg total) by mouth 2 (two) times daily.   metFORMIN  500 MG 24 hr tablet Commonly known as: GLUCOPHAGE -XR Take 500 mg by mouth 2 (two) times daily.   multivitamin with minerals Tabs tablet Take 1 tablet by mouth daily.   nitroGLYCERIN  0.4 MG SL tablet Commonly known as: NITROSTAT  Place 0.4 mg under the tongue every 5 (five) minutes x 3 doses as needed for chest pain (if no relief after 2nd dose, proceed to ED or call 911).   NON FORMULARY Pt uses a cpap   olmesartan  40 MG tablet Commonly known as: BENICAR  TAKE (1) TABLET BY MOUTH ONCE DAILY.   oxyCODONE  5 MG immediate release tablet Commonly known as: Oxy IR/ROXICODONE  Take 1-2 tablets (5-10 mg total) by mouth every 4 (four) hours as needed for severe pain (pain score 7-10). Take 2 tabs only for severe pain   polyethylene glycol 17 g packet Commonly known as: MIRALAX  / GLYCOLAX  Take 17 g by mouth 2 (two) times daily.   pramipexole  0.75 MG tablet Commonly known as: MIRAPEX  Take 1 tablet (0.75 mg total) by mouth 3 (three) times daily.   prochlorperazine  10 MG tablet Commonly known as: COMPAZINE  Take 1 tablet (10 mg total) by mouth every 6 (six) hours as needed for nausea or vomiting.   SAW PALMETTO PO Take 1 capsule by mouth daily.    Semaglutide  14 MG Tabs Take 14 mg by mouth daily.   senna 8.6 MG Tabs tablet Commonly known as: SENOKOT Take 2 tablets (17.2 mg total) by mouth at bedtime for 14 days.   tamsulosin  0.4 MG Caps capsule Commonly known as: FLOMAX  Take 0.4 mg by mouth daily.   tiZANidine  4 MG tablet Commonly known as: Zanaflex  Take 1 tablet (4 mg total) by mouth every 8 (eight) hours as needed for muscle spasms.   zaleplon  10 MG capsule Commonly known as: SONATA  Take 1 capsule (10 mg total) by mouth at bedtime as needed for sleep.               Discharge Care  Instructions  (From admission, onward)           Start     Ordered   01/16/24 0000  Change dressing       Comments: Maintain surgical dressing until follow up in the clinic. If the edges start to pull up, may reinforce with tape. If the dressing is no longer working, may remove and cover with gauze and tape, but must keep the area dry and clean.  Call with any questions or concerns.   01/16/24 0741            Diagnostic Studies: DG Pelvis Portable Result Date: 01/15/2024 CLINICAL DATA:  Status post total left hip arthroplasty. EXAM: PORTABLE PELVIS 1-2 VIEWS COMPARISON:  Sacrum and coccyx radiographs 08/09/2010, CT chest, abdomen, and pelvis 11/29/2023 FINDINGS: Interval total left hip arthroplasty. Redemonstration of total right hip arthroplasty. No perihardware lucency is seen to indicate hardware failure or loosening. Mild bilateral sacroiliac subchondral sclerosis. Mild pubic symphysis joint space narrowing. Expected postoperative lateral left hip subcutaneous air. Moderate right L4-5 degenerative disc changes. No acute fracture or dislocation. IMPRESSION: Interval total left hip arthroplasty without evidence of hardware failure. Electronically Signed   By: Bertina Broccoli M.D.   On: 01/15/2024 16:25   DG HIP UNILAT WITH PELVIS 1V LEFT Result Date: 01/15/2024 CLINICAL DATA:  Intraoperative total left hip arthroplasty. EXAM: DG HIP  (WITH OR WITHOUT PELVIS) 1V*L* COMPARISON:  Pelvis and left hip radiographs 09/19/2023 FINDINGS: Images were performed intraoperatively without the presence of a radiologist. Severe severe left femoroacetabular joint space narrowing with moderate flattening of the superior left femoral head. 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 The patient is undergoing total left hip arthroplasty. No hardware complication is seen. Total fluoroscopy images: 8 Total fluoroscopy time: 12 seconds Total dose: Radiation Exposure Index (as provided by the fluoroscopic device): 5.1 mGy air Kerma Please see intraoperative findings for further detail. IMPRESSION: Intraoperative fluoroscopy for total left hip arthroplasty. Electronically Signed   By: Bertina Broccoli M.D.   On: 01/15/2024 16:23   DG C-Arm 1-60 Min-No Report Result Date: 01/15/2024 Fluoroscopy was utilized by the requesting physician.  No radiographic interpretation.    Disposition: Discharge disposition: 01-Home or Self Care       Discharge Instructions     Call MD / Call 911   Complete by: As directed    If you experience chest pain or shortness of breath, CALL 911 and be transported to the hospital emergency room.  If you develope a fever above 101 F, pus (white drainage) or increased drainage or redness at the wound, or calf pain, call your surgeon's office.   Change dressing   Complete by: As directed    Maintain surgical dressing until follow up in the clinic. If the edges start to pull up, may reinforce with tape. If the dressing is no longer working, may remove and cover with gauze and tape, but must keep the area dry and clean.  Call with any questions or concerns.   Constipation Prevention   Complete by: As directed    Drink plenty of fluids.  Prune juice may be helpful.  You may use a stool softener, such as Colace (over the counter) 100 mg twice a day.  Use MiraLax  (over the counter) for constipation as needed.   Diet - low  sodium heart healthy   Complete by: As directed    Increase activity slowly as tolerated   Complete by: As directed    Weight bearing  as tolerated with assist device (walker, cane, etc) as directed, use it as long as suggested by your surgeon or therapist, typically at least 4-6 weeks.   Post-operative opioid taper instructions:   Complete by: As directed    POST-OPERATIVE OPIOID TAPER INSTRUCTIONS: It is important to wean off of your opioid medication as soon as possible. If you do not need pain medication after your surgery it is ok to stop day one. Opioids include: Codeine, Hydrocodone (Norco, Vicodin), Oxycodone (Percocet, oxycontin ) and hydromorphone  amongst others.  Long term and even short term use of opiods can cause: Increased pain response Dependence Constipation Depression Respiratory depression And more.  Withdrawal symptoms can include Flu like symptoms Nausea, vomiting And more Techniques to manage these symptoms Hydrate well Eat regular healthy meals Stay active Use relaxation techniques(deep breathing, meditating, yoga) Do Not substitute Alcohol to help with tapering If you have been on opioids for less than two weeks and do not have pain than it is ok to stop all together.  Plan to wean off of opioids This plan should start within one week post op of your joint replacement. Maintain the same interval or time between taking each dose and first decrease the dose.  Cut the total daily intake of opioids by one tablet each day Next start to increase the time between doses. The last dose that should be eliminated is the evening dose.      TED hose   Complete by: As directed    Use stockings (TED hose) for 2 weeks on both leg(s).  You may remove them at night for sleeping.        Follow-up Information     Earnie Gola, PA-C. Go on 01/30/2024.   Specialty: Orthopedic Surgery Why: You are scheduled for first post op appt on Wednesday May 21 at  2:45pm. Contact information: [address] [place] [address] [phone]                  Signed: Earnie Gola 01/22/2024, 7:16 AM

## 2024-01-23 NOTE — Progress Notes (Unsigned)
 Aaron Aas

## 2024-01-24 ENCOUNTER — Ambulatory Visit: Admitting: Family Medicine

## 2024-01-24 ENCOUNTER — Encounter: Payer: Self-pay | Admitting: Family Medicine

## 2024-01-24 ENCOUNTER — Encounter: Payer: Self-pay | Admitting: Hematology

## 2024-01-24 VITALS — BP 114/69 | HR 80 | Ht 72.0 in | Wt 292.5 lb

## 2024-01-24 DIAGNOSIS — G2581 Restless legs syndrome: Secondary | ICD-10-CM

## 2024-01-24 DIAGNOSIS — G620 Drug-induced polyneuropathy: Secondary | ICD-10-CM

## 2024-01-24 DIAGNOSIS — G4733 Obstructive sleep apnea (adult) (pediatric): Secondary | ICD-10-CM

## 2024-01-25 ENCOUNTER — Other Ambulatory Visit: Payer: Self-pay

## 2024-01-26 ENCOUNTER — Other Ambulatory Visit: Payer: Self-pay | Admitting: Hematology

## 2024-02-12 ENCOUNTER — Inpatient Hospital Stay

## 2024-02-12 ENCOUNTER — Inpatient Hospital Stay: Attending: Hematology | Admitting: Hematology

## 2024-02-12 VITALS — BP 133/81 | HR 80 | Temp 97.5°F | Resp 20 | Wt 302.5 lb

## 2024-02-12 DIAGNOSIS — Z79899 Other long term (current) drug therapy: Secondary | ICD-10-CM | POA: Diagnosis not present

## 2024-02-12 DIAGNOSIS — Z87891 Personal history of nicotine dependence: Secondary | ICD-10-CM | POA: Insufficient documentation

## 2024-02-12 DIAGNOSIS — Z452 Encounter for adjustment and management of vascular access device: Secondary | ICD-10-CM | POA: Insufficient documentation

## 2024-02-12 DIAGNOSIS — C221 Intrahepatic bile duct carcinoma: Secondary | ICD-10-CM | POA: Insufficient documentation

## 2024-02-12 DIAGNOSIS — C787 Secondary malignant neoplasm of liver and intrahepatic bile duct: Secondary | ICD-10-CM

## 2024-02-12 DIAGNOSIS — R140 Abdominal distension (gaseous): Secondary | ICD-10-CM | POA: Diagnosis not present

## 2024-02-12 DIAGNOSIS — Z5111 Encounter for antineoplastic chemotherapy: Secondary | ICD-10-CM | POA: Diagnosis present

## 2024-02-12 DIAGNOSIS — Z8551 Personal history of malignant neoplasm of bladder: Secondary | ICD-10-CM | POA: Diagnosis not present

## 2024-02-12 DIAGNOSIS — R180 Malignant ascites: Secondary | ICD-10-CM | POA: Insufficient documentation

## 2024-02-12 DIAGNOSIS — G479 Sleep disorder, unspecified: Secondary | ICD-10-CM | POA: Diagnosis not present

## 2024-02-12 DIAGNOSIS — M545 Low back pain, unspecified: Secondary | ICD-10-CM | POA: Insufficient documentation

## 2024-02-12 DIAGNOSIS — Z95828 Presence of other vascular implants and grafts: Secondary | ICD-10-CM

## 2024-02-12 DIAGNOSIS — G629 Polyneuropathy, unspecified: Secondary | ICD-10-CM | POA: Insufficient documentation

## 2024-02-12 DIAGNOSIS — R5383 Other fatigue: Secondary | ICD-10-CM | POA: Insufficient documentation

## 2024-02-12 DIAGNOSIS — R600 Localized edema: Secondary | ICD-10-CM | POA: Diagnosis not present

## 2024-02-12 DIAGNOSIS — Z5112 Encounter for antineoplastic immunotherapy: Secondary | ICD-10-CM | POA: Insufficient documentation

## 2024-02-12 DIAGNOSIS — R6881 Early satiety: Secondary | ICD-10-CM | POA: Insufficient documentation

## 2024-02-12 LAB — CBC WITH DIFFERENTIAL/PLATELET
Abs Immature Granulocytes: 0.04 10*3/uL (ref 0.00–0.07)
Basophils Absolute: 0 10*3/uL (ref 0.0–0.1)
Basophils Relative: 1 %
Eosinophils Absolute: 0.2 10*3/uL (ref 0.0–0.5)
Eosinophils Relative: 3 %
HCT: 33.7 % — ABNORMAL LOW (ref 39.0–52.0)
Hemoglobin: 10.7 g/dL — ABNORMAL LOW (ref 13.0–17.0)
Immature Granulocytes: 1 %
Lymphocytes Relative: 19 %
Lymphs Abs: 1.2 10*3/uL (ref 0.7–4.0)
MCH: 29.8 pg (ref 26.0–34.0)
MCHC: 31.8 g/dL (ref 30.0–36.0)
MCV: 93.9 fL (ref 80.0–100.0)
Monocytes Absolute: 0.8 10*3/uL (ref 0.1–1.0)
Monocytes Relative: 12 %
Neutro Abs: 4 10*3/uL (ref 1.7–7.7)
Neutrophils Relative %: 64 %
Platelets: 292 10*3/uL (ref 150–400)
RBC: 3.59 MIL/uL — ABNORMAL LOW (ref 4.22–5.81)
RDW: 15.1 % (ref 11.5–15.5)
WBC: 6.2 10*3/uL (ref 4.0–10.5)
nRBC: 0 % (ref 0.0–0.2)

## 2024-02-12 LAB — COMPREHENSIVE METABOLIC PANEL WITH GFR
ALT: 18 U/L (ref 0–44)
AST: 19 U/L (ref 15–41)
Albumin: 3.4 g/dL — ABNORMAL LOW (ref 3.5–5.0)
Alkaline Phosphatase: 88 U/L (ref 38–126)
Anion gap: 8 (ref 5–15)
BUN: 31 mg/dL — ABNORMAL HIGH (ref 8–23)
CO2: 26 mmol/L (ref 22–32)
Calcium: 8.9 mg/dL (ref 8.9–10.3)
Chloride: 98 mmol/L (ref 98–111)
Creatinine, Ser: 1.1 mg/dL (ref 0.61–1.24)
GFR, Estimated: >60 mL/min (ref >60)
Glucose, Bld: 149 mg/dL — ABNORMAL HIGH (ref 70–99)
Potassium: 3.7 mmol/L (ref 3.5–5.1)
Sodium: 132 mmol/L — ABNORMAL LOW (ref 135–145)
Total Bilirubin: 0.5 mg/dL (ref 0.0–1.2)
Total Protein: 6.4 g/dL — ABNORMAL LOW (ref 6.5–8.1)

## 2024-02-12 LAB — MAGNESIUM: Magnesium: 1.9 mg/dL (ref 1.7–2.4)

## 2024-02-12 MED ORDER — POTASSIUM CHLORIDE IN NACL 20-0.9 MEQ/L-% IV SOLN
Freq: Once | INTRAVENOUS | Status: AC
  Filled 2024-02-12: qty 1000

## 2024-02-12 MED ORDER — DEXAMETHASONE SODIUM PHOSPHATE 10 MG/ML IJ SOLN
10.0000 mg | Freq: Once | INTRAMUSCULAR | Status: AC
  Administered 2024-02-12: 10 mg via INTRAVENOUS
  Filled 2024-02-12: qty 1

## 2024-02-12 MED ORDER — SODIUM CHLORIDE 0.9 % IV SOLN
1500.0000 mg | Freq: Once | INTRAVENOUS | Status: AC
  Administered 2024-02-12: 1500 mg via INTRAVENOUS
  Filled 2024-02-12: qty 30

## 2024-02-12 MED ORDER — HEPARIN SOD (PORK) LOCK FLUSH 100 UNIT/ML IV SOLN: 500.0000 [IU] | Freq: Once | INTRAVENOUS | Status: DC

## 2024-02-12 MED ORDER — PALONOSETRON HCL INJECTION 0.25 MG/5ML
0.2500 mg | Freq: Once | INTRAVENOUS | Status: AC
  Administered 2024-02-12: 0.25 mg via INTRAVENOUS
  Filled 2024-02-12: qty 5

## 2024-02-12 MED ORDER — SODIUM CHLORIDE 0.9 % IV SOLN: Freq: Once | INTRAVENOUS | Status: AC

## 2024-02-12 MED ORDER — HEPARIN SOD (PORK) LOCK FLUSH 100 UNIT/ML IV SOLN
500.0000 [IU] | Freq: Once | INTRAVENOUS | Status: AC | PRN
  Administered 2024-02-12: 500 [IU]

## 2024-02-12 MED ORDER — SODIUM CHLORIDE FLUSH 0.9 % IV SOLN
10.0000 mL | Freq: Once | INTRAVENOUS | Status: AC
  Administered 2024-02-12: 10 mL via INTRAVENOUS
  Filled 2024-02-12: qty 10

## 2024-02-12 MED ORDER — SODIUM CHLORIDE 0.9% FLUSH: 10.0000 mL | INTRAVENOUS | Status: DC | PRN

## 2024-02-12 MED ORDER — SPIRONOLACTONE 100 MG PO TABS: 100.0000 mg | ORAL_TABLET | Freq: Every day | ORAL | 1 refills | Status: DC

## 2024-02-12 MED ORDER — MAGNESIUM SULFATE 2 GM/50ML IV SOLN
2.0000 g | Freq: Once | INTRAVENOUS | Status: AC
  Administered 2024-02-12: 2 g via INTRAVENOUS
  Filled 2024-02-12: qty 50

## 2024-02-12 MED ORDER — SODIUM CHLORIDE 0.9 % IV SOLN
25.0000 mg/m2 | Freq: Once | INTRAVENOUS | Status: AC
  Administered 2024-02-12: 65 mg via INTRAVENOUS
  Filled 2024-02-12: qty 65

## 2024-02-12 MED ORDER — SODIUM CHLORIDE 0.9 % IV SOLN
150.0000 mg | Freq: Once | INTRAVENOUS | Status: AC
  Administered 2024-02-12: 150 mg via INTRAVENOUS
  Filled 2024-02-12: qty 150

## 2024-02-12 MED ORDER — SODIUM CHLORIDE 0.9 % IV SOLN: INTRAVENOUS | Status: DC

## 2024-02-12 MED ORDER — SODIUM CHLORIDE 0.9 % IV SOLN
800.0000 mg/m2 | Freq: Once | INTRAVENOUS | Status: AC
  Administered 2024-02-12: 2052 mg via INTRAVENOUS
  Filled 2024-02-12: qty 53.97

## 2024-02-12 NOTE — Patient Instructions (Signed)
 [facility] - A DEPT OF [facility]  Discharge Instructions: Thank you for choosing [facility] to provide your oncology and hematology care.  If you have a lab appointment with the Cancer Center - please note that after April 8th, 2024, all labs will be drawn in the cancer center.  You do not have to check in or register with the main entrance as you have in the past but will complete your check-in in the cancer center.  Wear comfortable clothing and clothing appropriate for easy access to any Portacath or PICC line.   We strive to give you quality time with your provider. You may need to reschedule your appointment if you arrive late (15 or more minutes).  Arriving late affects you and other patients whose appointments are after yours.  Also, if you miss three or more appointments without notifying the office, you may be dismissed from the clinic at the provider's discretion.      For prescription refill requests, have your pharmacy contact our office and allow 72 hours for refills to be completed.    Today you received the following chemotherapy and/or immunotherapy agents Imfinzi /Gemzar /Cisplatin .  Durvalumab  Injection What is this medication? DURVALUMAB  (dur VAL ue mab) treats some types of cancer. It works by helping your immune system slow or stop the spread of cancer cells. It is a monoclonal antibody. This medicine may be used for other purposes; ask your health care provider or pharmacist if you have questions. COMMON BRAND NAME(S): IMFINZI  What should I tell my care team before I take this medication? They need to know if you have any of these conditions: Allogeneic stem cell transplant (uses someone else's stem cells) Autoimmune diseases, such as Crohn disease, ulcerative colitis, lupus History of chest radiation Nervous system problems, such as Guillain-Barre syndrome, myasthenia gravis Organ transplant An unusual or allergic  reaction to durvalumab , other medications, foods, dyes, or preservatives Pregnant or trying to get pregnant Breast-feeding How should I use this medication? This medication is infused into a vein. It is given by your care team in a hospital or clinic setting. A special MedGuide will be given to you before each treatment. Be sure to read this information carefully each time. Talk to your care team about the use of this medication in children. Special care may be needed. Overdosage: If you think you have taken too much of this medicine contact a poison control center or emergency room at once. NOTE: This medicine is only for you. Do not share this medicine with others. What if I miss a dose? Keep appointments for follow-up doses. It is important not to miss your dose. Call your care team if you are unable to keep an appointment. What may interact with this medication? Interactions have not been studied. This list may not describe all possible interactions. Give your health care provider a list of all the medicines, herbs, non-prescription drugs, or dietary supplements you use. Also tell them if you smoke, drink alcohol, or use illegal drugs. Some items may interact with your medicine. What should I watch for while using this medication? Your condition will be monitored carefully while you are receiving this medication. You may need blood work while taking this medication. This medication may cause serious skin reactions. They can happen weeks to months after starting the medication. Contact your care team right away if you notice fevers or flu-like symptoms with a rash. The rash may be red or  purple and then turn into blisters or peeling of the skin. You may also notice a red rash with swelling of the face, lips, or lymph nodes in your neck or under your arms. Tell your care team right away if you have any change in your eyesight. Talk to your care team if you may be pregnant. Serious birth defects  can occur if you take this medication during pregnancy and for 3 months after the last dose. You will need a negative pregnancy test before starting this medication. Contraception is recommended while taking this medication and for 3 months after the last dose. Your care team can help you find the option that works for you. Do not breastfeed while taking this medication and for 3 months after the last dose. What side effects may I notice from receiving this medication? Side effects that you should report to your care team as soon as possible: Allergic reactions--skin rash, itching, hives, swelling of the face, lips, tongue, or throat Dry cough, shortness of breath or trouble breathing Eye pain, redness, irritation, or discharge with blurry or decreased vision Heart muscle inflammation--unusual weakness or fatigue, shortness of breath, chest pain, fast or irregular heartbeat, dizziness, swelling of the ankles, feet, or hands Hormone gland problems--headache, sensitivity to light, unusual weakness or fatigue, dizziness, fast or irregular heartbeat, increased sensitivity to cold or heat, excessive sweating, constipation, hair loss, increased thirst or amount of urine, tremors or shaking, irritability Infusion reactions--chest pain, shortness of breath or trouble breathing, feeling faint or lightheaded Kidney injury (glomerulonephritis)--decrease in the amount of urine, red or dark Jontrell Bushong urine, foamy or bubbly urine, swelling of the ankles, hands, or feet Liver injury--right upper belly pain, loss of appetite, nausea, light-colored stool, dark yellow or Finis Hendricksen urine, yellowing skin or eyes, unusual weakness or fatigue Pain, tingling, or numbness in the hands or feet, muscle weakness, change in vision, confusion or trouble speaking, loss of balance or coordination, trouble walking, seizures Rash, fever, and swollen lymph nodes Redness, blistering, peeling, or loosening of the skin, including inside the  mouth Sudden or severe stomach pain, bloody diarrhea, fever, nausea, vomiting Side effects that usually do not require medical attention (report these to your care team if they continue or are bothersome): Bone, joint, or muscle pain Diarrhea Fatigue Loss of appetite Nausea Skin rash This list may not describe all possible side effects. Call your doctor for medical advice about side effects. You may report side effects to FDA at 1-800-FDA-1088. Where should I keep my medication? This medication is given in a hospital or clinic. It will not be stored at home. NOTE: This sheet is a summary. It may not cover all possible information. If you have questions about this medicine, talk to your doctor, pharmacist, or health care provider.  2024 Elsevier/Gold Standard (2022-01-10 00:00:00)   Gemcitabine  Injection What is this medication? GEMCITABINE  (jem SYE ta been) treats some types of cancer. It works by slowing down the growth of cancer cells. This medicine may be used for other purposes; ask your health care provider or pharmacist if you have questions. COMMON BRAND NAME(S): Gemzar , Infugem  What should I tell my care team before I take this medication? They need to know if you have any of these conditions: Blood disorders Infection Kidney disease Liver disease Lung or breathing disease, such as asthma or COPD Recent or ongoing radiation therapy An unusual or allergic reaction to gemcitabine , other medications, foods, dyes, or preservatives If you or your partner are pregnant or trying  to get pregnant Breast-feeding How should I use this medication? This medication is injected into a vein. It is given by your care team in a hospital or clinic setting. Talk to your care team about the use of this medication in children. Special care may be needed. Overdosage: If you think you have taken too much of this medicine contact a poison control center or emergency room at once. NOTE: This  medicine is only for you. Do not share this medicine with others. What if I miss a dose? Keep appointments for follow-up doses. It is important not to miss your dose. Call your care team if you are unable to keep an appointment. What may interact with this medication? Interactions have not been studied. This list may not describe all possible interactions. Give your health care provider a list of all the medicines, herbs, non-prescription drugs, or dietary supplements you use. Also tell them if you smoke, drink alcohol, or use illegal drugs. Some items may interact with your medicine. What should I watch for while using this medication? Your condition will be monitored carefully while you are receiving this medication. This medication may make you feel generally unwell. This is not uncommon, as chemotherapy can affect healthy cells as well as cancer cells. Report any side effects. Continue your course of treatment even though you feel ill unless your care team tells you to stop. In some cases, you may be given additional medications to help with side effects. Follow all directions for their use. This medication may increase your risk of getting an infection. Call your care team for advice if you get a fever, chills, sore throat, or other symptoms of a cold or flu. Do not treat yourself. Try to avoid being around people who are sick. This medication may increase your risk to bruise or bleed. Call your care team if you notice any unusual bleeding. Be careful brushing or flossing your teeth or using a toothpick because you may get an infection or bleed more easily. If you have any dental work done, tell your dentist you are receiving this medication. Avoid taking medications that contain aspirin , acetaminophen , ibuprofen, naproxen, or ketoprofen unless instructed by your care team. These medications may hide a fever. Talk to your care team if you or your partner wish to become pregnant or think you might  be pregnant. This medication can cause serious birth defects if taken during pregnancy and for 6 months after the last dose. A negative pregnancy test is required before starting this medication. A reliable form of contraception is recommended while taking this medication and for 6 months after the last dose. Talk to your care team about effective forms of contraception. Do not father a child while taking this medication and for 3 months after the last dose. Use a condom while having sex during this time period. Do not breastfeed while taking this medication and for at least 1 week after the last dose. This medication may cause infertility. Talk to your care team if you are concerned about your fertility. What side effects may I notice from receiving this medication? Side effects that you should report to your care team as soon as possible: Allergic reactions--skin rash, itching, hives, swelling of the face, lips, tongue, or throat Capillary leak syndrome--stomach or muscle pain, unusual weakness or fatigue, feeling faint or lightheaded, decrease in the amount of urine, swelling of the ankles, hands, or feet, trouble breathing Infection--fever, chills, cough, sore throat, wounds that don't heal, pain or  trouble when passing urine, general feeling of discomfort or being unwell Liver injury--right upper belly pain, loss of appetite, nausea, light-colored stool, dark yellow or Lateria Alderman urine, yellowing skin or eyes, unusual weakness or fatigue Low red blood cell level--unusual weakness or fatigue, dizziness, headache, trouble breathing Lung injury--shortness of breath or trouble breathing, cough, spitting up blood, chest pain, fever Stomach pain, bloody diarrhea, pale skin, unusual weakness or fatigue, decrease in the amount of urine, which may be signs of hemolytic uremic syndrome Sudden and severe headache, confusion, change in vision, seizures, which may be signs of posterior reversible encephalopathy  syndrome (PRES) Unusual bruising or bleeding Side effects that usually do not require medical attention (report to your care team if they continue or are bothersome): Diarrhea Drowsiness Hair loss Nausea Pain, redness, or swelling with sores inside the mouth or throat Vomiting This list may not describe all possible side effects. Call your doctor for medical advice about side effects. You may report side effects to FDA at 1-800-FDA-1088. Where should I keep my medication? This medication is given in a hospital or clinic. It will not be stored at home. NOTE: This sheet is a summary. It may not cover all possible information. If you have questions about this medicine, talk to your doctor, pharmacist, or health care provider.  2024 Elsevier/Gold Standard (2022-01-03 00:00:00)   Cisplatin  Injection What is this medication? CISPLATIN  (SIS pla tin) treats some types of cancer. It works by slowing down the growth of cancer cells. This medicine may be used for other purposes; ask your health care provider or pharmacist if you have questions. COMMON BRAND NAME(S): Platinol , Platinol  -AQ What should I tell my care team before I take this medication? They need to know if you have any of these conditions: Eye disease, vision problems Hearing problems Kidney disease Low blood counts, such as low white cells, platelets, or red blood cells Tingling of the fingers or toes, or other nerve disorder An unusual or allergic reaction to cisplatin , carboplatin, oxaliplatin, other medications, foods, dyes, or preservatives If you or your partner are pregnant or trying to get pregnant Breast-feeding How should I use this medication? This medication is injected into a vein. It is given by your care team in a hospital or clinic setting. Talk to your care team about the use of this medication in children. Special care may be needed. Overdosage: If you think you have taken too much of this medicine contact a  poison control center or emergency room at once. NOTE: This medicine is only for you. Do not share this medicine with others. What if I miss a dose? Keep appointments for follow-up doses. It is important not to miss your dose. Call your care team if you are unable to keep an appointment. What may interact with this medication? Do not take this medication with any of the following: Live virus vaccines This medication may also interact with the following: Certain antibiotics, such as amikacin, gentamicin, neomycin, polymyxin B, streptomycin, tobramycin, vancomycin  Foscarnet This list may not describe all possible interactions. Give your health care provider a list of all the medicines, herbs, non-prescription drugs, or dietary supplements you use. Also tell them if you smoke, drink alcohol, or use illegal drugs. Some items may interact with your medicine. What should I watch for while using this medication? Your condition will be monitored carefully while you are receiving this medication. You may need blood work done while taking this medication. This medication may make you feel generally  unwell. This is not uncommon, as chemotherapy can affect healthy cells as well as cancer cells. Report any side effects. Continue your course of treatment even though you feel ill unless your care team tells you to stop. This medication may increase your risk of getting an infection. Call your care team for advice if you get a fever, chills, sore throat, or other symptoms of a cold or flu. Do not treat yourself. Try to avoid being around people who are sick. Avoid taking medications that contain aspirin , acetaminophen , ibuprofen, naproxen, or ketoprofen unless instructed by your care team. These medications may hide a fever. This medication may increase your risk to bruise or bleed. Call your care team if you notice any unusual bleeding. Be careful brushing or flossing your teeth or using a toothpick because you  may get an infection or bleed more easily. If you have any dental work done, tell your dentist you are receiving this medication. Drink fluids as directed while you are taking this medication. This will help protect your kidneys. Call your care team if you get diarrhea. Do not treat yourself. Talk to your care team if you or your partner wish to become pregnant or think you might be pregnant. This medication can cause serious birth defects if taken during pregnancy and for 14 months after the last dose. A negative pregnancy test is required before starting this medication. A reliable form of contraception is recommended while taking this medication and for 14 months after the last dose. Talk to your care team about effective forms of contraception. Do not father a child while taking this medication and for 11 months after the last dose. Use a condom during sex during this time period. Do not breast-feed while taking this medication. This medication may cause infertility. Talk to your care team if you are concerned about your fertility. What side effects may I notice from receiving this medication? Side effects that you should report to your care team as soon as possible: Allergic reactions--skin rash, itching, hives, swelling of the face, lips, tongue, or throat Eye pain, change in vision, vision loss Hearing loss, ringing in ears Infection--fever, chills, cough, sore throat, wounds that don't heal, pain or trouble when passing urine, general feeling of discomfort or being unwell Kidney injury--decrease in the amount of urine, swelling of the ankles, hands, or feet Low red blood cell level--unusual weakness or fatigue, dizziness, headache, trouble breathing Painful swelling, warmth, or redness of the skin, blisters or sores at the infusion site Pain, tingling, or numbness in the hands or feet Unusual bruising or bleeding Side effects that usually do not require medical attention (report to your care  team if they continue or are bothersome): Hair loss Nausea Vomiting This list may not describe all possible side effects. Call your doctor for medical advice about side effects. You may report side effects to FDA at 1-800-FDA-1088. Where should I keep my medication? This medication is given in a hospital or clinic. It will not be stored at home. NOTE: This sheet is a summary. It may not cover all possible information. If you have questions about this medicine, talk to your doctor, pharmacist, or health care provider.  2024 Elsevier/Gold Standard (2021-12-30 00:00:00)       To help prevent nausea and vomiting after your treatment, we encourage you to take your nausea medication as directed.  BELOW ARE SYMPTOMS THAT SHOULD BE REPORTED IMMEDIATELY: *FEVER GREATER THAN 100.4 F (38 C) OR HIGHER *CHILLS OR SWEATING *NAUSEA  AND VOMITING THAT IS NOT CONTROLLED WITH YOUR NAUSEA MEDICATION *UNUSUAL SHORTNESS OF BREATH *UNUSUAL BRUISING OR BLEEDING *URINARY PROBLEMS (pain or burning when urinating, or frequent urination) *BOWEL PROBLEMS (unusual diarrhea, constipation, pain near the anus) TENDERNESS IN MOUTH AND THROAT WITH OR WITHOUT PRESENCE OF ULCERS (sore throat, sores in mouth, or a toothache) UNUSUAL RASH, SWELLING OR PAIN  UNUSUAL VAGINAL DISCHARGE OR ITCHING   Items with * indicate a potential emergency and should be followed up as soon as possible or go to the Emergency Department if any problems should occur.  Please show the CHEMOTHERAPY ALERT CARD or IMMUNOTHERAPY ALERT CARD at check-in to the Emergency Department and triage nurse.  Should you have questions after your visit or need to cancel or reschedule your appointment, please contact [facility]  - A DEPT OF [facility] [phone]  and follow the prompts.  Office hours are 8:00 a.m. to 4:30 p.m. Monday - Friday. Please note that voicemails left after 4:00 p.m. may not be returned until the  following business day.  We are closed weekends and major holidays. You have access to a nurse at all times for urgent questions. Please call the main number to the clinic [phone] and follow the prompts.  For any non-urgent questions, you may also contact your provider using MyChart. We now offer e-Visits for anyone 60 and older to request care online for non-urgent symptoms. For details visit [email or web address].   Also download the MyChart app! Go to the app store, search "MyChart", open the app, select [facility], and log in with your MyChart username and password.

## 2024-02-12 NOTE — Patient Instructions (Signed)

## 2024-02-12 NOTE — Progress Notes (Signed)
 Confirmed dose of Gemcitabine  to be at 800 mg/m2.  Orders updated.  V.O. Dr Davina Ester, PharmD

## 2024-02-12 NOTE — Progress Notes (Signed)
 [facility] [address], [place] [address]    Clinic Day:  02/12/2024  Referring physician: Omie Bickers, MD  Patient Care Team: Omie Bickers, MD as PCP - General (Internal Medicine) Gerard Knight, MD as PCP - Cardiology (Cardiology) Gerard Knight, MD as Consulting Physician (Cardiology) Paulett Boros, MD as Medical Oncologist (Medical Oncology) Phebe Brasil, MD as Consulting Physician (Neurology) Diamond Formica, MD as Consulting Physician (Pulmonary Disease)   ASSESSMENT & PLAN:   Assessment: 1. Liver lesion/peritoneal carcinomatosis: - Patient seen at the request of Dr. [name] - Reported pressure on the sides of the abdomen with slight pain for the last 1 month.  He also reported pain in the epigastric region. - He had lost 20 pounds in the last 3 to 4 months intentionally, cutting back on sugars.  He was also started on semaglutide . - Colonoscopy on 06/28/2011 with a benign polypoid colonic mucosa in the descending colon.  No evidence of malignancy. - MRI of the brain was negative. - EGD and colonoscopy on 09/23/2018 did not reveal any malignancies. 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  IHC positive for CK7, CDX2 and negative for GATA3.  Findings suggestive of upper GI or pancreaticobiliary primary. - Cycle 1 of gemcitabine , cisplatin  and durvalumab  started on 10/05/2021. - NGS: No targetable mutations.  PD-L1 (SP142) negative.  MSI-stable.  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   2. Social/family history: - He lives at home by himself.  He records voiceover/narrations. - Non-smoker. - Father died of MDS.  Paternal grandfather had prostate cancer.  Maternal grandfather had cancer.  3.  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 Rozanne Corners.  Reportedly received 1 treatment of intravesical chemo and has been on surveillance since then.  Last surveillance visit was  in 2020.    Plan: 1. Cholangiocarcinoma with peritoneal carcinomatosis: - CT CAP (11/29/2023): Increased burden of peritoneal/mesenteric nodularity and new trace perihepatic fluid. - Reviewed labs: LFTs are normal.  Creatinine is 1.1.  CBC grossly normal with normocytic anemia hemoglobin 10.7.  Last CA 19-9 was 41. - He reports abdominal tightness for the past 2 weeks and early satiety. - It could be worsening ascites.  Will start him on spironolactone 100 mg daily. - Will also order ultrasound abdomen for possible paracentesis. - He had a left hip replacement done and is recovering well from.  Last treatment was on 12/20/2023.  He may proceed with cycle 2-day 1 today.  RTC 4 weeks for follow-up with repeat labs including tumor marker.  Will also check ferritin and iron panel.   2. Fluid retention: - Continue Bumex  daily as needed.  He is off of metolazone .  Fluid is well-controlled.  3.  Neuropathy/"drawing" of legs: - Continue Mirapex  3 times daily. - He has numbness/tingling in the toes on the bottom of the feet which is constant and stable.  Continue gabapentin  300 mg 3 times daily.  4.  Sleeping difficulty: - Continue Lunesta at bedtime as needed.  5.  Hypomagnesemia: - Continue magnesium  twice daily.  Magnesium  is normal.   6.  Right-sided lower back pain: - MRI of the lumbar spine and pelvis on 05/30/2023 showed DJD and no evidence of malignancy.    Orders Placed This Encounter  Procedures   US  Paracentesis    Standing Status:   Future    Expiration Date:   02/11/2025    If therapeutic, is there a maximum  amount of fluid to be removed?:   No    Are labs required for specimen collection?:   No    Is Albumin  medication needed?:   No    Reason for Exam (SYMPTOM  OR DIAGNOSIS REQUIRED):   ascites    Preferred imaging location?:   [facility]   Magnesium     Standing Status:   Future    Expected Date:   03/11/2024    Expiration Date:   03/11/2025   CBC with Differential     Standing Status:   Future    Expected Date:   03/11/2024    Expiration Date:   03/11/2025   Comprehensive metabolic panel    Standing Status:   Future    Expected Date:   03/11/2024    Expiration Date:   03/11/2025   T4    Standing Status:   Future    Expected Date:   03/11/2024    Expiration Date:   03/11/2025   TSH    Standing Status:   Future    Expected Date:   03/11/2024    Expiration Date:   03/11/2025   Magnesium     Standing Status:   Future    Expected Date:   03/25/2024    Expiration Date:   03/25/2025   CBC with Differential    Standing Status:   Future    Expected Date:   03/25/2024    Expiration Date:   03/25/2025   Comprehensive metabolic panel    Standing Status:   Future    Expected Date:   03/25/2024    Expiration Date:   03/25/2025   Magnesium     Standing Status:   Future    Expected Date:   04/08/2024    Expiration Date:   04/08/2025   CBC with Differential    Standing Status:   Future    Expected Date:   04/08/2024    Expiration Date:   04/08/2025   Comprehensive metabolic panel    Standing Status:   Future    Expected Date:   04/08/2024    Expiration Date:   04/08/2025   Magnesium     Standing Status:   Future    Expected Date:   04/22/2024    Expiration Date:   04/22/2025   CBC with Differential    Standing Status:   Future    Expected Date:   04/22/2024    Expiration Date:   04/22/2025   Comprehensive metabolic panel    Standing Status:   Future    Expected Date:   04/22/2024    Expiration Date:   04/22/2025   Magnesium     Standing Status:   Future    Expected Date:   05/06/2024    Expiration Date:   05/06/2025   CBC with Differential    Standing Status:   Future    Expected Date:   05/06/2024    Expiration Date:   05/06/2025   Comprehensive metabolic panel    Standing Status:   Future    Expected Date:   05/06/2024    Expiration Date:   05/06/2025   Magnesium     Standing Status:   Future    Expected Date:   05/20/2024    Expiration Date:   05/20/2025   CBC with  Differential    Standing Status:   Future    Expected Date:   05/20/2024    Expiration Date:   05/20/2025   Comprehensive metabolic panel  Standing Status:   Future    Expected Date:   05/20/2024    Expiration Date:   05/20/2025   Magnesium     Standing Status:   Future    Expected Date:   06/03/2024    Expiration Date:   06/03/2025   CBC with Differential    Standing Status:   Future    Expected Date:   06/03/2024    Expiration Date:   06/03/2025   Comprehensive metabolic panel    Standing Status:   Future    Expected Date:   06/03/2024    Expiration Date:   06/03/2025   T4    Standing Status:   Future    Expected Date:   06/03/2024    Expiration Date:   06/03/2025   TSH    Standing Status:   Future    Expected Date:   06/03/2024    Expiration Date:   06/03/2025   Magnesium     Standing Status:   Future    Expected Date:   06/17/2024    Expiration Date:   06/17/2025   CBC with Differential    Standing Status:   Future    Expected Date:   06/17/2024    Expiration Date:   06/17/2025   Comprehensive metabolic panel    Standing Status:   Future    Expected Date:   06/17/2024    Expiration Date:   06/17/2025   Iron and TIBC ([facility] ONLY)    Standing Status:   Future    Expected Date:   03/11/2024    Expiration Date:   02/11/2025   Ferritin    Standing Status:   Future    Expected Date:   03/11/2024    Expiration Date:   02/11/2025   Cancer antigen 19-9    Standing Status:   Future    Expected Date:   03/11/2024    Expiration Date:   02/11/2025      Hurman Maiden R Teague,acting as a scribe for Paulett Boros, MD.,have documented all relevant documentation on the behalf of Paulett Boros, MD,as directed by  Paulett Boros, MD while in the presence of Paulett Boros, MD.  I, Paulett Boros MD, have reviewed the above documentation for accuracy and completeness, and I agree with the above.     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 male presenting to clinic today for follow up of cholangiocarcinoma and peritoneal carcinomatosis. He  was last seen by me on 12/20/23.  Since his last visit, he underwent left total hip arthroplasty on 01/15/24 with Dr. Bernard Brick. [place] is doing at-home therapy.   Today, he states that he is doing well overall. His appetite level is at 100%. His energy level is at 25%. Johnney reports Sonata  is not effective for improving sleeping difficulty. He is currently taking tramadol  for sleeping difficulty, as it is more effective.  He attributes symptoms to heightened anxiety at night.   Anwar reports low energy levels and stamina, he attributes to low activity levels before and after surgery. He is taking gabapentin  and Mirapex  as prescribed. He is requiring Bumex  almost daily.   Antoino denies any abdominal pain, though he does note abdominal distention that has worsened over the past 2 weeks. He  reports associated early satiety, but denies any decreased food intake.  Elijahjames denies any side effects after last chemotherapy, other than fatigue lasting 2 days after treatment.   Neuropathy in the feet in the form of numbness and tingling in the feet is stable. Symptoms are worsened at night. He denies any pain in the feet.   PAST MEDICAL HISTORY:   Past Medical History: Past Medical History:  Diagnosis Date   Anxiety    Arthritis    Bladder cancer (HCC) 09/11/2009   CAD (coronary artery disease), native coronary artery    a. Mildly elevated troponin 03/2013, cath with nonobstructive disease including 50% AV groove distal stenosis before large OM   Depression    Essential hypertension    Headache(784.0)    History of migraines   Hyperglycemia    Mixed hyperlipidemia    Neuropathy    Obesity    Port-A-Cath in place 09/30/2021   Pre-diabetes    Seasonal allergies    Sleep apnea    On CPAP    Surgical History: Past Surgical History:  Procedure Laterality Date   BIOPSY  09/23/2021   Procedure: BIOPSY;  Surgeon: Urban Garden, MD;  Location: [facility];  Service: Gastroenterology;;   COLONOSCOPY  06/28/2011   Procedure: COLONOSCOPY;  Surgeon: Ruby Corporal, MD;  Location: [facility];  Service: Endoscopy;  Laterality: N/A;   COLONOSCOPY WITH PROPOFOL  N/A 09/23/2021   Procedure: COLONOSCOPY WITH PROPOFOL ;  Surgeon: Urban Garden, MD;  Location: [facility];  Service: Gastroenterology;  Laterality: N/A;  940   ESOPHAGOGASTRODUODENOSCOPY (EGD) WITH PROPOFOL  N/A 09/23/2021   Procedure: ESOPHAGOGASTRODUODENOSCOPY (EGD) WITH PROPOFOL ;  Surgeon: Urban Garden, MD;  Location: [facility];  Service: Gastroenterology;  Laterality: N/A;   IR IMAGING GUIDED PORT INSERTION  09/28/2021   IR PARACENTESIS  09/28/2021   JOINT REPLACEMENT Right    hip   LEFT HEART CATHETERIZATION WITH CORONARY ANGIOGRAM N/A 03/31/2013   Procedure: LEFT HEART  CATHETERIZATION WITH CORONARY ANGIOGRAM;  Surgeon: Eilleen Grates, MD;  Location: [facility] CATH LAB;  Service: Cardiovascular;  Laterality: N/A;   POLYPECTOMY  09/23/2021   Procedure: POLYPECTOMY;  Surgeon: Urban Garden, MD;  Location: [facility];  Service: Gastroenterology;;   TOTAL HIP ARTHROPLASTY Left 01/15/2024   Procedure: ARTHROPLASTY, HIP, TOTAL, ANTERIOR APPROACH;  Surgeon: Claiborne Crew, MD;  Location: [facility];  Service: Orthopedics;  Laterality: Left;   TURBT  09/11/2009    Social History: Social History   Socioeconomic History   Marital status: Divorced    Spouse name: Not on file   Number of children: 0   Years of education: college   Highest education level: Not  on file  Occupational History    Employer: SELF-EMPLOYED  Tobacco Use   Smoking status: Former    Current packs/day: 0.00    Average packs/day: 0.5 packs/day for 10.0 years (5.0 ttl pk-yrs)    Types: Cigarettes    Start date: 07/09/2001    Quit date: 07/10/2011    Years since quitting: 12.6   Smokeless tobacco: Never   Tobacco comments:    Quit several yrs prior to 03/2013.  Vaping Use   Vaping status: Never Used  Substance and Sexual Activity   Alcohol use: Yes    Alcohol/week: 0.0 standard drinks of alcohol    Comment: Occasional   Drug use: No   Sexual activity: Not on file  Other Topics Concern   Not on file  Social History Narrative   Not on file   Social Drivers of Health   Financial Resource Strain: Not on file  Food Insecurity: No Food Insecurity (01/15/2024)   Hunger Vital Sign    Worried About Running Out of Food in the Last Year: Never true    Ran Out of Food in the Last Year: Never true  Transportation Needs: No Transportation Needs (01/15/2024)   PRAPARE - Administrator, Civil Service (Medical): No    Lack of Transportation (Non-Medical): No  Physical Activity: Not on file  Stress: Not on file  Social Connections: Moderately Isolated (01/15/2024)   Social  Connection and Isolation Panel [NHANES]    Frequency of Communication with Friends and Family: More than three times a week    Frequency of Social Gatherings with Friends and Family: More than three times a week    Attends Religious Services: More than 4 times per year    Active Member of [employer] or Organizations: No    Attends Banker Meetings: Never    Marital Status: Divorced  Catering manager Violence: Not At Risk (01/15/2024)   Humiliation, Afraid, Rape, and Kick questionnaire    Fear of Current or Ex-Partner: No    Emotionally Abused: No    Physically Abused: No    Sexually Abused: No    Family History: Family History  Problem Relation Age of Onset   COPD Father     Current Medications:  Current Outpatient Medications:    acetaminophen  (TYLENOL ) 500 MG tablet, Take 2 tablets (1,000 mg total) by mouth every 6 (six) hours., Disp: , Rfl:    amLODipine  (NORVASC ) 10 MG tablet, Take 1 tablet (10 mg total) by mouth daily., Disp: 90 tablet, Rfl: 3   aspirin  (ASPIRIN  CHILDRENS) 81 MG chewable tablet, Chew 1 tablet (81 mg total) by mouth in the morning and at bedtime., Disp: 84 tablet, Rfl: 0   bumetanide  (BUMEX ) 2 MG tablet, Take 1 tablet (2 mg total) by mouth daily as needed. (Patient taking differently: Take 2 mg by mouth daily.), Disp: , Rfl:    buPROPion  (WELLBUTRIN  XL) 300 MG 24 hr tablet, Take 300 mg by mouth daily., Disp: , Rfl:    cetirizine (ZYRTEC) 10 MG tablet, Take 10 mg by mouth daily., Disp: , Rfl:    cyclobenzaprine  (FLEXERIL ) 10 MG tablet, Take 10 mg by mouth every 6 (six) hours as needed., Disp: , Rfl:    docusate sodium (COLACE) 100 MG capsule, Take 100-200 mg by mouth at bedtime as needed for mild constipation., Disp: , Rfl:    Durvalumab  (IMFINZI  IV), Inject into the vein every 21 ( twenty-one) days., Disp: , Rfl:    Eszopiclone 3 MG  TABS, Take 3 mg by mouth at bedtime., Disp: , Rfl:    famotidine  (PEPCID ) 20 MG tablet, Take 20 mg by mouth at bedtime.,  Disp: , Rfl:    fenofibrate  160 MG tablet, Take 160 mg by mouth daily., Disp: , Rfl:    FLUoxetine  (PROZAC ) 10 MG capsule, Take 10 mg by mouth daily., Disp: , Rfl:    gabapentin  (NEURONTIN ) 300 MG capsule, Take 300 mg by mouth every 6 (six) hours as needed (pain)., Disp: , Rfl:    guaiFENesin (MUCINEX) 600 MG 12 hr tablet, Take 600 mg by mouth 2 (two) times daily as needed for cough or to loosen phlegm., Disp: , Rfl:    magnesium  oxide (MAG-OX) 400 (240 Mg) MG tablet, Take 1 tablet (400 mg total) by mouth 2 (two) times daily., Disp: 90 tablet, Rfl: 6   metFORMIN  (GLUCOPHAGE -XR) 500 MG 24 hr tablet, Take 500 mg by mouth 2 (two) times daily., Disp: , Rfl:    Multiple Vitamin (MULTIVITAMIN WITH MINERALS) TABS tablet, Take 1 tablet by mouth daily., Disp: , Rfl:    nitroGLYCERIN  (NITROSTAT ) 0.4 MG SL tablet, Place 0.4 mg under the tongue every 5 (five) minutes x 3 doses as needed for chest pain (if no relief after 2nd dose, proceed to ED or call 911)., Disp: , Rfl:    NON FORMULARY, Pt uses a cpap, Disp: , Rfl:    olmesartan  (BENICAR ) 40 MG tablet, TAKE ONE TABLET BY MOUTH EVERY DAY, Disp: 90 tablet, Rfl: 2   omeprazole (PRILOSEC) 40 MG capsule, Take 40 mg by mouth daily., Disp: , Rfl:    oxyCODONE  (OXY IR/ROXICODONE ) 5 MG immediate release tablet, Take 1-2 tablets (5-10 mg total) by mouth every 4 (four) hours as needed for severe pain (pain score 7-10). Take 2 tabs only for severe pain, Disp: 42 tablet, Rfl: 0   polyethylene glycol (MIRALAX  / GLYCOLAX ) 17 g packet, Take 17 g by mouth 2 (two) times daily., Disp: 14 each, Rfl: 0   pramipexole  (MIRAPEX ) 0.75 MG tablet, Take 1 tablet (0.75 mg total) by mouth 3 (three) times daily., Disp: 90 tablet, Rfl: 0   prochlorperazine  (COMPAZINE ) 10 MG tablet, Take 1 tablet (10 mg total) by mouth every 6 (six) hours as needed for nausea or vomiting., Disp: 30 tablet, Rfl: 2   Saw Palmetto, Serenoa repens, (SAW PALMETTO PO), Take 1 capsule by mouth daily., Disp: , Rfl:     Semaglutide  14 MG TABS, Take 14 mg by mouth daily., Disp: , Rfl:    spironolactone (ALDACTONE) 100 MG tablet, Take 1 tablet (100 mg total) by mouth daily., Disp: 30 tablet, Rfl: 1   tamsulosin  (FLOMAX ) 0.4 MG CAPS capsule, Take 0.4 mg by mouth daily., Disp: , Rfl:    tiZANidine  (ZANAFLEX ) 4 MG tablet, Take 1 tablet (4 mg total) by mouth every 8 (eight) hours as needed for muscle spasms., Disp: 40 tablet, Rfl: 1   traMADol  (ULTRAM ) 50 MG tablet, TAKE ONE TABLET BY MOUTH AT BEDTIME AS NEEDED FOR PAIN, Disp: , Rfl:    zaleplon  (SONATA ) 10 MG capsule, Take 1 capsule (10 mg total) by mouth at bedtime as needed for sleep., Disp: 30 capsule, Rfl: 5 No current facility-administered medications for this visit.  Facility-Administered Medications Ordered in Other Visits:    0.9 %  sodium chloride  infusion, , Intravenous, Continuous, Paulett Boros, MD, Last Rate: 10 mL/hr at 02/12/24 0925, New Bag at 02/12/24 0925   0.9 %  sodium chloride  infusion, , Intravenous, Once, Armen Waring,  Tereasa Felty, MD   CISplatin  (PLATINOL ) 65 mg in sodium chloride  0.9 % 250 mL chemo infusion, 25 mg/m2 (Treatment Plan Recorded), Intravenous, Once, Paulett Boros, MD   gemcitabine  (GEMZAR ) 2,052 mg in sodium chloride  0.9 % 250 mL chemo infusion, 800 mg/m2 (Treatment Plan Recorded), Intravenous, Once, Paulett Boros, MD, Last Rate: 608 mL/hr at 02/12/24 1238, 2,052 mg at 02/12/24 1238   heparin  lock flush 100 unit/mL, 500 Units, Intracatheter, Once PRN, Mauro Arps, MD   magnesium  sulfate 2 GM/50ML IVPB, , , ,    sodium chloride  flush (NS) 0.9 % injection 10 mL, 10 mL, Intracatheter, PRN, Chidi Shirer, MD   Allergies: No Known Allergies  REVIEW OF SYSTEMS:   Review of Systems  Constitutional:  Negative for chills, fatigue and fever.  HENT:   Negative for lump/mass, mouth sores, nosebleeds, sore throat and trouble swallowing.   Eyes:  Negative for eye problems.  Respiratory:  Positive for  cough and shortness of breath.   Cardiovascular:  Negative for chest pain, leg swelling and palpitations.  Gastrointestinal:  Positive for abdominal distention and constipation. Negative for abdominal pain, diarrhea, nausea and vomiting.  Genitourinary:  Negative for bladder incontinence, difficulty urinating, dysuria, frequency, hematuria and nocturia.   Musculoskeletal:  Negative for arthralgias, back pain, flank pain, myalgias and neck pain.  Skin:  Negative for itching and rash.  Neurological:  Positive for numbness (and tingling in feet). Negative for dizziness and headaches.  Hematological:  Does not bruise/bleed easily.  Psychiatric/Behavioral:  Negative for depression, sleep disturbance and suicidal ideas. The patient is not nervous/anxious.   All other systems reviewed and are negative.    VITALS:   There were no vitals taken for this visit.  Wt Readings from Last 3 Encounters:  02/12/24 (!) 302 lb 7.5 oz (137.2 kg)  01/24/24 292 lb 8 oz (132.7 kg)  01/15/24 295 lb (133.8 kg)    There is no height or weight on file to calculate BMI.  Performance status (ECOG): 1 - Symptomatic but completely ambulatory  PHYSICAL EXAM:   Physical Exam Vitals and nursing note reviewed. Exam conducted with a chaperone present.  Constitutional:      Appearance: Normal appearance.  Cardiovascular:     Rate and Rhythm: Normal rate and regular rhythm.     Pulses: Normal pulses.     Heart sounds: Normal heart sounds.  Pulmonary:     Effort: Pulmonary effort is normal.     Breath sounds: Normal breath sounds.  Abdominal:     Palpations: Abdomen is soft. There is no hepatomegaly, splenomegaly or mass.     Tenderness: There is no abdominal tenderness.  Musculoskeletal:     Right lower leg: No edema.     Left lower leg: No edema.  Lymphadenopathy:     Cervical: No cervical adenopathy.     Right cervical: No superficial, deep or posterior cervical adenopathy.    Left cervical: No superficial,  deep or posterior cervical adenopathy.     Upper Body:     Right upper body: No supraclavicular or axillary adenopathy.     Left upper body: No supraclavicular or axillary adenopathy.  Neurological:     General: No focal deficit present.     Mental Status: He is alert and oriented to person, place, and time.  Psychiatric:        Mood and Affect: Mood normal.        Behavior: Behavior normal.     LABS:      Latest  Ref Rng & Units 02/12/2024    7:55 AM 01/16/2024    3:35 AM 12/20/2023    7:56 AM  CBC  WBC 4.0 - 10.5 K/uL 6.2  10.6  5.5   Hemoglobin 13.0 - 17.0 g/dL 96.0  45.4  09.8   Hematocrit 39.0 - 52.0 % 33.7  30.3  39.5   Platelets 150 - 400 K/uL 292  211  231       Latest Ref Rng & Units 02/12/2024    7:55 AM 01/16/2024    3:35 AM 12/20/2023    7:56 AM  CMP  Glucose 70 - 99 mg/dL 119  147  829   BUN 8 - 23 mg/dL 31  36  35   Creatinine 0.61 - 1.24 mg/dL 5.62  1.30  8.65   Sodium 135 - 145 mmol/L 132  131  133   Potassium 3.5 - 5.1 mmol/L 3.7  4.0  3.8   Chloride 98 - 111 mmol/L 98  97  98   CO2 22 - 32 mmol/L 26  23  24    Calcium  8.9 - 10.3 mg/dL 8.9  8.5  9.1   Total Protein 6.5 - 8.1 g/dL 6.4   6.3   Total Bilirubin 0.0 - 1.2 mg/dL 0.5   0.4   Alkaline Phos 38 - 126 U/L 88   88   AST 15 - 41 U/L 19   22   ALT 0 - 44 U/L 18   35      Lab Results  Component Value Date   CEA1 3.4 10/11/2023   /  CEA  Date Value Ref Range Status  10/11/2023 3.4 0.0 - 4.7 ng/mL Final    Comment:    (NOTE)                             Nonsmokers          <3.9                             Smokers             <5.6 [employer] Electrochemiluminescence Immunoassay (ECLIA) Values obtained with different assay methods or kits cannot be used interchangeably.  Results cannot be interpreted as absolute evidence of the presence or absence of malignant disease. Performed At: [facility] [address] [place], [place] [id number] Pearlean Botts MD [id number]    No  results found for: "PSA1" Lab Results  Component Value Date   CAN199 41 (H) 12/06/2023   No results found for: "CAN125"  No results found for: "TOTALPROTELP", "ALBUMINELP", "A1GS", "A2GS", "BETS", "BETA2SER", "GAMS", "MSPIKE", "SPEI" Lab Results  Component Value Date   TIBC 428 01/29/2023   TIBC 457 (H) 01/04/2022   FERRITIN 183 01/29/2023   FERRITIN 522 (H) 01/04/2022   IRONPCTSAT 15 01/29/2023   IRONPCTSAT 16 (L) 01/04/2022   Lab Results  Component Value Date   LDH 266 (H) 10/05/2021   LDH 242 (H) 09/01/2021     STUDIES:   DG Pelvis Portable Result Date: 01/15/2024 CLINICAL DATA:  Status post total left hip arthroplasty. EXAM: PORTABLE PELVIS 1-2 VIEWS COMPARISON:  Sacrum and coccyx radiographs 08/09/2010, CT chest, abdomen, and pelvis 11/29/2023 FINDINGS: Interval total left hip arthroplasty. Redemonstration of total right hip arthroplasty. No perihardware lucency is seen to indicate hardware failure or loosening. Mild bilateral sacroiliac subchondral sclerosis. Mild pubic symphysis joint  space narrowing. Expected postoperative lateral left hip subcutaneous air. Moderate right L4-5 degenerative disc changes. No acute fracture or dislocation. IMPRESSION: Interval total left hip arthroplasty without evidence of hardware failure. Electronically Signed   By: Bertina Broccoli M.D.   On: 01/15/2024 16:25   DG HIP UNILAT WITH PELVIS 1V LEFT Result Date: 01/15/2024 CLINICAL DATA:  Intraoperative total left hip arthroplasty. EXAM: DG HIP (WITH OR WITHOUT PELVIS) 1V*L* COMPARISON:  Pelvis and left hip radiographs 09/19/2023 FINDINGS: Images were performed intraoperatively without the presence of a radiologist. Severe severe left femoroacetabular joint space narrowing with moderate flattening of the superior left femoral head. 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 The patient is undergoing total left hip arthroplasty. No hardware complication is seen. Total fluoroscopy images: 8  Total fluoroscopy time: 12 seconds Total dose: Radiation Exposure Index (as provided by the fluoroscopic device): 5.1 mGy air Kerma Please see intraoperative findings for further detail. IMPRESSION: Intraoperative fluoroscopy for total left hip arthroplasty. Electronically Signed   By: Bertina Broccoli M.D.   On: 01/15/2024 16:23   DG C-Arm 1-60 Min-No Report Result Date: 01/15/2024 Fluoroscopy was utilized by the requesting physician.  No radiographic interpretation.

## 2024-02-12 NOTE — Progress Notes (Signed)
Patient presents today for chemotherapy infusion. Patient is in satisfactory condition with no new complaints voiced.  Vital signs are stable.  Labs reviewed by Dr. Katragadda during the office visit and all labs are within treatment parameters.  We will proceed with treatment per MD orders.   Patient tolerated treatment well with no complaints voiced.  Patient left ambulatory in stable condition.  Vital signs stable at discharge.  Follow up as scheduled.       

## 2024-02-13 ENCOUNTER — Ambulatory Visit (HOSPITAL_COMMUNITY)
Admission: RE | Admit: 2024-02-13 | Discharge: 2024-02-13 | Disposition: A | Discharge status: Home / Self Care | Source: Ambulatory Visit | Attending: Hematology | Admitting: Hematology

## 2024-02-13 ENCOUNTER — Other Ambulatory Visit: Payer: Self-pay | Admitting: Hematology

## 2024-02-13 DIAGNOSIS — R180 Malignant ascites: Secondary | ICD-10-CM | POA: Insufficient documentation

## 2024-02-13 DIAGNOSIS — C786 Secondary malignant neoplasm of retroperitoneum and peritoneum: Secondary | ICD-10-CM | POA: Diagnosis not present

## 2024-02-13 DIAGNOSIS — C221 Intrahepatic bile duct carcinoma: Secondary | ICD-10-CM | POA: Insufficient documentation

## 2024-02-13 DIAGNOSIS — Z95828 Presence of other vascular implants and grafts: Secondary | ICD-10-CM

## 2024-02-13 NOTE — Progress Notes (Signed)
 Patient presents for therapeutic  paracentesis. [place] limited abdomen shows trace amount of peritoneal fluid noted  Insufficient to perform a safe paracentesis. Procedure not performed.

## 2024-02-26 ENCOUNTER — Inpatient Hospital Stay

## 2024-02-26 DIAGNOSIS — Z95828 Presence of other vascular implants and grafts: Secondary | ICD-10-CM

## 2024-02-26 DIAGNOSIS — Z5112 Encounter for antineoplastic immunotherapy: Secondary | ICD-10-CM | POA: Diagnosis not present

## 2024-02-26 DIAGNOSIS — C221 Intrahepatic bile duct carcinoma: Secondary | ICD-10-CM

## 2024-02-26 LAB — COMPREHENSIVE METABOLIC PANEL WITH GFR
ALT: 19 U/L (ref 0–44)
AST: 19 U/L (ref 15–41)
Albumin: 3.3 g/dL — ABNORMAL LOW (ref 3.5–5.0)
Alkaline Phosphatase: 102 U/L (ref 38–126)
Anion gap: 15 (ref 5–15)
BUN: 58 mg/dL — ABNORMAL HIGH (ref 8–23)
CO2: 22 mmol/L (ref 22–32)
Calcium: 9.1 mg/dL (ref 8.9–10.3)
Chloride: 95 mmol/L — ABNORMAL LOW (ref 98–111)
Creatinine, Ser: 2.21 mg/dL — ABNORMAL HIGH (ref 0.61–1.24)
GFR, Estimated: 32 mL/min — ABNORMAL LOW (ref >60)
Glucose, Bld: 199 mg/dL — ABNORMAL HIGH (ref 70–99)
Potassium: 4.5 mmol/L (ref 3.5–5.1)
Sodium: 132 mmol/L — ABNORMAL LOW (ref 135–145)
Total Bilirubin: 0.6 mg/dL (ref 0.0–1.2)
Total Protein: 6.3 g/dL — ABNORMAL LOW (ref 6.5–8.1)

## 2024-02-26 LAB — CBC WITH DIFFERENTIAL/PLATELET
Abs Immature Granulocytes: 0.13 10*3/uL — ABNORMAL HIGH (ref 0.00–0.07)
Basophils Absolute: 0.1 10*3/uL (ref 0.0–0.1)
Basophils Relative: 1 %
Eosinophils Absolute: 0.2 10*3/uL (ref 0.0–0.5)
Eosinophils Relative: 2 %
HCT: 32 % — ABNORMAL LOW (ref 39.0–52.0)
Hemoglobin: 10.3 g/dL — ABNORMAL LOW (ref 13.0–17.0)
Immature Granulocytes: 2 %
Lymphocytes Relative: 15 %
Lymphs Abs: 1.1 10*3/uL (ref 0.7–4.0)
MCH: 30.3 pg (ref 26.0–34.0)
MCHC: 32.2 g/dL (ref 30.0–36.0)
MCV: 94.1 fL (ref 80.0–100.0)
Monocytes Absolute: 0.8 10*3/uL (ref 0.1–1.0)
Monocytes Relative: 11 %
Neutro Abs: 5.2 10*3/uL (ref 1.7–7.7)
Neutrophils Relative %: 69 %
Platelets: 350 10*3/uL (ref 150–400)
RBC: 3.4 MIL/uL — ABNORMAL LOW (ref 4.22–5.81)
RDW: 15.4 % (ref 11.5–15.5)
WBC: 7.4 10*3/uL (ref 4.0–10.5)
nRBC: 0 % (ref 0.0–0.2)

## 2024-02-26 LAB — MAGNESIUM: Magnesium: 2.1 mg/dL (ref 1.7–2.4)

## 2024-02-26 MED ORDER — SODIUM CHLORIDE 0.9% FLUSH
10.0000 mL | Freq: Once | INTRAVENOUS | Status: AC
  Administered 2024-02-26: 10 mL via INTRAVENOUS

## 2024-02-26 MED ORDER — SODIUM CHLORIDE 0.9 % IV SOLN: INTRAVENOUS | Status: AC

## 2024-02-26 MED ORDER — HEPARIN SOD (PORK) LOCK FLUSH 100 UNIT/ML IV SOLN
500.0000 [IU] | Freq: Once | INTRAVENOUS | Status: AC
  Administered 2024-02-26: 500 [IU] via INTRAVENOUS

## 2024-02-26 MED ORDER — SODIUM CHLORIDE 0.9% FLUSH
10.0000 mL | INTRAVENOUS | Status: DC | PRN
  Administered 2024-02-26: 10 mL via INTRAVENOUS

## 2024-02-26 NOTE — Progress Notes (Signed)
 Patient presents today for Cisplatin /Gemzar  infusion per providers order. BP noted to be 84/56, and creatinine elevated,  MD notified.  Message received from Donzell Gallery RN/Dr. Katragadda, no treatment today due to creatinine.  Patient will receive one liter of Saline today,one liter of saline tomorrow and will come back in one week for labs and possible treatment.    Stable during infusion without adverse affects.  Vital signs stable.  No complaints at this time.  Discharge from clinic ambulatory in stable condition.  Alert and oriented X 3.  Follow up with [facility] as scheduled.

## 2024-02-26 NOTE — Progress Notes (Signed)
 Patients port flushed without difficulty.  Good blood return noted with no bruising or swelling noted at site.  Patient remains accessed for treatment.

## 2024-02-26 NOTE — Patient Instructions (Signed)
 [facility] [place] - A DEPT OF . [facility]  Discharge Instructions: Thank you for choosing [facility] to provide your oncology and hematology care.  If you have a lab appointment with the Cancer Center - please note that after April 8th, 2024, all labs will be drawn in the cancer center.  You do not have to check in or register with the main entrance as you have in the past but will complete your check-in in the cancer center.  Wear comfortable clothing and clothing appropriate for easy access to any Portacath or PICC line.   We strive to give you quality time with your provider. You may need to reschedule your appointment if you arrive late (15 or more minutes).  Arriving late affects you and other patients whose appointments are after yours.  Also, if you miss three or more appointments without notifying the office, you may be dismissed from the clinic at the provider's discretion.      For prescription refill requests, have your pharmacy contact our office and allow 72 hours for refills to be completed.    Today you received the following chemotherapy and/or immunotherapy agents hydration over 2 hours      To help prevent nausea and vomiting after your treatment, we encourage you to take your nausea medication as directed.  BELOW ARE SYMPTOMS THAT SHOULD BE REPORTED IMMEDIATELY: *FEVER GREATER THAN 100.4 F (38 C) OR HIGHER *CHILLS OR SWEATING *NAUSEA AND VOMITING THAT IS NOT CONTROLLED WITH YOUR NAUSEA MEDICATION *UNUSUAL SHORTNESS OF BREATH *UNUSUAL BRUISING OR BLEEDING *URINARY PROBLEMS (pain or burning when urinating, or frequent urination) *BOWEL PROBLEMS (unusual diarrhea, constipation, pain near the anus) TENDERNESS IN MOUTH AND THROAT WITH OR WITHOUT PRESENCE OF ULCERS (sore throat, sores in mouth, or a toothache) UNUSUAL RASH, SWELLING OR PAIN  UNUSUAL VAGINAL DISCHARGE OR ITCHING   Items with * indicate a potential emergency and should  be followed up as soon as possible or go to the Emergency Department if any problems should occur.  Please show the CHEMOTHERAPY ALERT CARD or IMMUNOTHERAPY ALERT CARD at check-in to the Emergency Department and triage nurse.  Should you have questions after your visit or need to cancel or reschedule your appointment, please contact [facility] [place] - A DEPT OF [facility] [phone]  and follow the prompts.  Office hours are 8:00 a.m. to 4:30 p.m. Monday - Friday. Please note that voicemails left after 4:00 p.m. may not be returned until the following business day.  We are closed weekends and major holidays. You have access to a nurse at all times for urgent questions. Please call the main number to the clinic [phone] and follow the prompts.  For any non-urgent questions, you may also contact your provider using MyChart. We now offer e-Visits for anyone 75 and older to request care online for non-urgent symptoms. For details visit [email or web address].   Also download the MyChart app! Go to the app store, search MyChart, open the app, select [place], and log in with your MyChart username and password.

## 2024-02-27 ENCOUNTER — Inpatient Hospital Stay

## 2024-02-27 VITALS — BP 93/57 | HR 86 | Temp 97.2°F | Resp 20

## 2024-02-27 DIAGNOSIS — R7989 Other specified abnormal findings of blood chemistry: Secondary | ICD-10-CM

## 2024-02-27 DIAGNOSIS — Z5112 Encounter for antineoplastic immunotherapy: Secondary | ICD-10-CM | POA: Diagnosis not present

## 2024-02-27 DIAGNOSIS — C221 Intrahepatic bile duct carcinoma: Secondary | ICD-10-CM

## 2024-02-27 DIAGNOSIS — Z95828 Presence of other vascular implants and grafts: Secondary | ICD-10-CM

## 2024-02-27 MED ORDER — SODIUM CHLORIDE 0.9% FLUSH
10.0000 mL | INTRAVENOUS | Status: DC | PRN
  Administered 2024-02-27: 10 mL via INTRAVENOUS

## 2024-02-27 MED ORDER — HEPARIN SOD (PORK) LOCK FLUSH 100 UNIT/ML IV SOLN
500.0000 [IU] | Freq: Once | INTRAVENOUS | Status: AC
  Administered 2024-02-27: 500 [IU] via INTRAVENOUS

## 2024-02-27 MED ORDER — SODIUM CHLORIDE 0.9 % IV SOLN: INTRAVENOUS | Status: DC

## 2024-02-27 NOTE — Patient Instructions (Signed)

## 2024-02-27 NOTE — Progress Notes (Signed)
 Patient presents today for IVF.   Patient is in satisfactory condition with no new complaints voiced.  Vital signs are stable. We will proceed with IVF per MD orders.    Patient tolerated IVF well with no complaints voiced.  Patient left ambulatory in stable condition.  Vital signs stable at discharge.  Follow up as scheduled.

## 2024-03-04 ENCOUNTER — Inpatient Hospital Stay

## 2024-03-04 ENCOUNTER — Inpatient Hospital Stay (HOSPITAL_BASED_OUTPATIENT_CLINIC_OR_DEPARTMENT_OTHER): Admitting: Hematology

## 2024-03-04 VITALS — BP 126/74 | HR 86 | Temp 97.0°F | Resp 19

## 2024-03-04 DIAGNOSIS — Z5112 Encounter for antineoplastic immunotherapy: Secondary | ICD-10-CM | POA: Diagnosis not present

## 2024-03-04 DIAGNOSIS — Z8551 Personal history of malignant neoplasm of bladder: Secondary | ICD-10-CM

## 2024-03-04 DIAGNOSIS — C221 Intrahepatic bile duct carcinoma: Secondary | ICD-10-CM | POA: Diagnosis not present

## 2024-03-04 DIAGNOSIS — G629 Polyneuropathy, unspecified: Secondary | ICD-10-CM

## 2024-03-04 DIAGNOSIS — R188 Other ascites: Secondary | ICD-10-CM

## 2024-03-04 DIAGNOSIS — M545 Low back pain, unspecified: Secondary | ICD-10-CM

## 2024-03-04 DIAGNOSIS — Z95828 Presence of other vascular implants and grafts: Secondary | ICD-10-CM

## 2024-03-04 DIAGNOSIS — G479 Sleep disorder, unspecified: Secondary | ICD-10-CM

## 2024-03-04 DIAGNOSIS — C787 Secondary malignant neoplasm of liver and intrahepatic bile duct: Secondary | ICD-10-CM

## 2024-03-04 LAB — CBC WITH DIFFERENTIAL/PLATELET
Abs Immature Granulocytes: 0.19 10*3/uL — ABNORMAL HIGH (ref 0.00–0.07)
Basophils Absolute: 0.1 10*3/uL (ref 0.0–0.1)
Basophils Relative: 1 %
Eosinophils Absolute: 0.1 10*3/uL (ref 0.0–0.5)
Eosinophils Relative: 2 %
HCT: 35.8 % — ABNORMAL LOW (ref 39.0–52.0)
Hemoglobin: 11.6 g/dL — ABNORMAL LOW (ref 13.0–17.0)
Immature Granulocytes: 2 %
Lymphocytes Relative: 17 %
Lymphs Abs: 1.3 10*3/uL (ref 0.7–4.0)
MCH: 30.3 pg (ref 26.0–34.0)
MCHC: 32.4 g/dL (ref 30.0–36.0)
MCV: 93.5 fL (ref 80.0–100.0)
Monocytes Absolute: 1.5 10*3/uL — ABNORMAL HIGH (ref 0.1–1.0)
Monocytes Relative: 19 %
Neutro Abs: 4.7 10*3/uL (ref 1.7–7.7)
Neutrophils Relative %: 59 %
Platelets: 434 10*3/uL — ABNORMAL HIGH (ref 150–400)
RBC: 3.83 MIL/uL — ABNORMAL LOW (ref 4.22–5.81)
RDW: 15.3 % (ref 11.5–15.5)
WBC: 7.9 10*3/uL (ref 4.0–10.5)
nRBC: 1.3 % — ABNORMAL HIGH (ref 0.0–0.2)

## 2024-03-04 LAB — COMPREHENSIVE METABOLIC PANEL WITH GFR
ALT: 26 U/L (ref 0–44)
AST: 26 U/L (ref 15–41)
Albumin: 3.7 g/dL (ref 3.5–5.0)
Alkaline Phosphatase: 94 U/L (ref 38–126)
Anion gap: 13 (ref 5–15)
BUN: 39 mg/dL — ABNORMAL HIGH (ref 8–23)
CO2: 24 mmol/L (ref 22–32)
Calcium: 9.1 mg/dL (ref 8.9–10.3)
Chloride: 94 mmol/L — ABNORMAL LOW (ref 98–111)
Creatinine, Ser: 1.43 mg/dL — ABNORMAL HIGH (ref 0.61–1.24)
GFR, Estimated: 53 mL/min — ABNORMAL LOW (ref >60)
Glucose, Bld: 193 mg/dL — ABNORMAL HIGH (ref 70–99)
Potassium: 4 mmol/L (ref 3.5–5.1)
Sodium: 131 mmol/L — ABNORMAL LOW (ref 135–145)
Total Bilirubin: 0.6 mg/dL (ref 0.0–1.2)
Total Protein: 7.1 g/dL (ref 6.5–8.1)

## 2024-03-04 LAB — MAGNESIUM: Magnesium: 2 mg/dL (ref 1.7–2.4)

## 2024-03-04 MED ORDER — SODIUM CHLORIDE 0.9 % IV SOLN
150.0000 mg | Freq: Once | INTRAVENOUS | Status: AC
  Administered 2024-03-04: 150 mg via INTRAVENOUS
  Filled 2024-03-04: qty 150

## 2024-03-04 MED ORDER — SODIUM CHLORIDE 0.9 % IV SOLN: INTRAVENOUS | Status: DC

## 2024-03-04 MED ORDER — SODIUM CHLORIDE 0.9 % IV SOLN
800.0000 mg/m2 | Freq: Once | INTRAVENOUS | Status: AC
  Administered 2024-03-04: 2052 mg via INTRAVENOUS
  Filled 2024-03-04: qty 53.97

## 2024-03-04 MED ORDER — SODIUM CHLORIDE 0.9 % IV SOLN
20.0000 mg/m2 | Freq: Once | INTRAVENOUS | Status: AC
  Administered 2024-03-04: 50 mg via INTRAVENOUS
  Filled 2024-03-04: qty 50

## 2024-03-04 MED ORDER — MAGNESIUM SULFATE 2 GM/50ML IV SOLN
2.0000 g | Freq: Once | INTRAVENOUS | Status: AC
  Administered 2024-03-04: 2 g via INTRAVENOUS

## 2024-03-04 MED ORDER — POTASSIUM CHLORIDE IN NACL 20-0.9 MEQ/L-% IV SOLN
Freq: Once | INTRAVENOUS | Status: AC
  Filled 2024-03-04: qty 1000

## 2024-03-04 MED ORDER — PALONOSETRON HCL INJECTION 0.25 MG/5ML
0.2500 mg | Freq: Once | INTRAVENOUS | Status: AC
  Administered 2024-03-04: 0.25 mg via INTRAVENOUS
  Filled 2024-03-04: qty 5

## 2024-03-04 MED ORDER — SODIUM CHLORIDE 0.9 % IV SOLN: Freq: Once | INTRAVENOUS | Status: AC

## 2024-03-04 MED ORDER — DEXAMETHASONE SODIUM PHOSPHATE 10 MG/ML IJ SOLN
10.0000 mg | Freq: Once | INTRAMUSCULAR | Status: AC
  Administered 2024-03-04: 10 mg via INTRAVENOUS
  Filled 2024-03-04: qty 1

## 2024-03-04 MED ORDER — HEPARIN SOD (PORK) LOCK FLUSH 100 UNIT/ML IV SOLN
500.0000 [IU] | Freq: Once | INTRAVENOUS | Status: AC | PRN
  Administered 2024-03-04: 500 [IU]

## 2024-03-04 NOTE — Progress Notes (Signed)
 Patient has been assessed, vital signs and labs have been reviewed by Dr. Rogers. ANC, Creatinine 1.43 ( will have house fluids to be scheduled on Thursday), LFTs, and Platelets are within treatment parameters per Dr. Rogers. The patient is good to proceed with treatment at this time.  Primary RN and pharmacy aware.

## 2024-03-04 NOTE — Progress Notes (Signed)
 Patient tolerated chemotherapy with no complaints voiced.  Side effects with management reviewed with understanding verbalized.  Port site clean and dry with no bruising or swelling noted at site.  Good blood return noted before and after administration of chemotherapy.  Band aid applied.  Patient left in satisfactory condition with VSS and no s/s of distress noted. All follow ups as scheduled.   Venkat Ankney [employer]

## 2024-03-04 NOTE — Patient Instructions (Signed)
 [facility]  Discharge Instructions  You were seen and examined today by Dr. Rogers.  Dr. Rogers discussed your most recent lab work revealed that your creatinine is still high. Dr. Katragadda is going to set you up to get fluids on Thursday to help with this.  You got your treatment today.  Follow-up as scheduled.    Thank you for choosing [facility] to provide your oncology and hematology care.   To afford each patient quality time with our provider, please arrive at least 15 minutes before your scheduled appointment time. You may need to reschedule your appointment if you arrive late (10 or more minutes). Arriving late affects you and other patients whose appointments are after yours.  Also, if you miss three or more appointments without notifying the office, you may be dismissed from the clinic at the provider's discretion.    Again, thank you for choosing [facility].  Our hope is that these requests will decrease the amount of time that you wait before being seen by our physicians.   If you have a lab appointment with the Cancer Center - please note that after April 8th, all labs will be drawn in the cancer center.  You do not have to check in or register with the main entrance as you have in the past but will complete your check-in at the cancer center.            _____________________________________________________________  Should you have questions after your visit to [facility], please contact our office at [phone] and follow the prompts.  Our office hours are 8:00 a.m. to 4:30 p.m. Monday - Thursday and 8:00 a.m. to 2:30 p.m. Friday.  Please note that voicemails left after 4:00 p.m. may not be returned until the following business day.  We are closed weekends and all major holidays.  You do have access to a nurse 24-7, just call the main number to the clinic [phone] and do not  press any options, hold on the line and a nurse will answer the phone.    For prescription refill requests, have your pharmacy contact our office and allow 72 hours.    Masks are no longer required in the cancer centers. If you would like for your care team to wear a mask while they are taking care of you, please let them know. You may have one support person who is at least 69 years old accompany you for your appointments.

## 2024-03-04 NOTE — Patient Instructions (Signed)
 [facility] [place] - A DEPT OF [facility]  Discharge Instructions: Thank you for choosing [facility] to provide your oncology and hematology care.  If you have a lab appointment with the Cancer Center - please note that after April 8th, 2024, all labs will be drawn in the cancer center.  You do not have to check in or register with the main entrance as you have in the past but will complete your check-in in the cancer center.  Wear comfortable clothing and clothing appropriate for easy access to any Portacath or PICC line.   We strive to give you quality time with your provider. You may need to reschedule your appointment if you arrive late (15 or more minutes).  Arriving late affects you and other patients whose appointments are after yours.  Also, if you miss three or more appointments without notifying the office, you may be dismissed from the clinic at the provider's discretion.      For prescription refill requests, have your pharmacy contact our office and allow 72 hours for refills to be completed.    Today you received the following chemotherapy and/or immunotherapy agents Gemzar , Cisplatin    To help prevent nausea and vomiting after your treatment, we encourage you to take your nausea medication as directed.  BELOW ARE SYMPTOMS THAT SHOULD BE REPORTED IMMEDIATELY: *FEVER GREATER THAN 100.4 F (38 C) OR HIGHER *CHILLS OR SWEATING *NAUSEA AND VOMITING THAT IS NOT CONTROLLED WITH YOUR NAUSEA MEDICATION *UNUSUAL SHORTNESS OF BREATH *UNUSUAL BRUISING OR BLEEDING *URINARY PROBLEMS (pain or burning when urinating, or frequent urination) *BOWEL PROBLEMS (unusual diarrhea, constipation, pain near the anus) TENDERNESS IN MOUTH AND THROAT WITH OR WITHOUT PRESENCE OF ULCERS (sore throat, sores in mouth, or a toothache) UNUSUAL RASH, SWELLING OR PAIN  UNUSUAL VAGINAL DISCHARGE OR ITCHING   Items with * indicate a potential emergency and should be  followed up as soon as possible or go to the Emergency Department if any problems should occur.  Please show the CHEMOTHERAPY ALERT CARD or IMMUNOTHERAPY ALERT CARD at check-in to the Emergency Department and triage nurse.  Should you have questions after your visit or need to cancel or reschedule your appointment, please contact [facility] [place] - A DEPT OF [facility] [phone]  and follow the prompts.  Office hours are 8:00 a.m. to 4:30 p.m. Monday - Friday. Please note that voicemails left after 4:00 p.m. may not be returned until the following business day.  We are closed weekends and major holidays. You have access to a nurse at all times for urgent questions. Please call the main number to the clinic [phone] and follow the prompts.  For any non-urgent questions, you may also contact your provider using MyChart. We now offer e-Visits for anyone 74 and older to request care online for non-urgent symptoms. For details visit [email or web address].   Also download the MyChart app! Go to the app store, search MyChart, open the app, select [place], and log in with your MyChart username and password.

## 2024-03-04 NOTE — Progress Notes (Signed)
 [facility] 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 Darlyn Shona - Reported pressure on the sides of the abdomen with slight pain for the last 1 month.  He also reported pain in the epigastric region. - He had lost 20 pounds in the last 3 to 4 months intentionally, cutting back on sugars.  He was also started on semaglutide . - Colonoscopy on 06/28/2011 with a benign polypoid colonic mucosa in the descending colon.  No evidence of malignancy. - MRI of the brain was negative. - EGD and colonoscopy on 09/23/2018 did not reveal any malignancies. 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  IHC positive for CK7, CDX2 and negative for GATA3.  Findings suggestive of upper GI or pancreaticobiliary primary. - Cycle 1 of gemcitabine , cisplatin  and durvalumab  started on 10/05/2021. - NGS: No targetable mutations.  PD-L1 (SP142) negative.  MSI-stable.  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   2. Social/family history: - He lives at home by himself.  He records voiceover/narrations. - Non-smoker. - Father died of MDS.  Paternal grandfather had prostate cancer.  Maternal grandfather had cancer.  3.  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 Renda.  Reportedly received 1 treatment of intravesical chemo and has been on surveillance since then.  Last  surveillance visit was in 2020.    Plan: 1. Cholangiocarcinoma with peritoneal carcinomatosis: - CT CAP (11/29/2023): Increased burden of peritoneal/mesenteric nodularity and new trace perihepatic fluid. - C2D1 on 02/12/24. C2D15 held last week as creatinine elevated at 2.21. He had nausea but denied vomiting. - Ringing ears stable. - Labs: Cr-1.43.  -  US  abd(6/4) trace ascites. STOP spironolactone . - Proceed with C2D15 with cisplatin  20mg /m2. IV fluids on Thursday. RTC 2 weeks.   2. Fluid retention: - Continue bumex  2mg  daily as needed. Metolazone  on hold.  3.  Neuropathy/drawing of legs: - Continue Mirapex  3 times daily. - Slight worsening of neuropathy in the feet.  Continue gabapentin  300 mg 3 times daily.  4.  Sleeping difficulty: - Continue Lunesta at bedtime as needed.  5.  Hypomagnesemia: - Continue magnesium  twice daily.  Mag is normal.   6.  Right-sided lower back pain: - MRI of the lumbar spine and pelvis on 05/30/2023 showed DJD and no evidence of malignancy.    No orders of the defined types were placed in this encounter.     LILLETTE Verneta SAUNDERS Teague,acting as a Neurosurgeon for Hai Rogers, MD.,have documented all relevant documentation on the behalf of Hai Rogers, MD,as directed by  Hai Rogers, MD while in the presence of Hai Rogers, MD.  I, Hai Rogers MD, have reviewed the above documentation for accuracy and completeness, and I agree with the above.     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 male presenting to clinic today for follow up of cholangiocarcinoma and peritoneal carcinomatosis. He was last seen by me on 02/12/24.  Today, he states that he is doing well overall. His appetite level is at 100%. His energy level is at 50%. Marvin Carr states he was working outside and was likely dehydrated last week which caused elevated creatinine. He was not taking fluid pills more than his normal amount and is reportedly drinking an adequate amount of fluids. He is unaware of what could have caused elevated blood pressure reading last week.   He is taking all medication as prescribed, other than blood pressure medications, and would ike a refill of Bumex . Marvin Carr reports swelling in bilateral legs.   He denies any diarrhea, nausea, or vomiting due to treatment. He does not bandlike burning pain along lower sternum for the past week. He also reports occasional stabbing pan on the left side of the abdomen, with his last episode of pain occurring last week.   He reports worsening peripheral neuropathy in the hands and feet that makes it difficult for him to sleep at night. Meyer notes tinnitus in the form of constant ringing in the ears that has been present for many years and has not worsened.   PAST MEDICAL HISTORY:   Past Medical History: Past Medical History:  Diagnosis Date    Anxiety    Arthritis    Bladder cancer (HCC) 09/11/2009   CAD (coronary artery disease), native coronary artery    a. Mildly elevated troponin 03/2013, cath with nonobstructive disease including 50% AV groove distal stenosis before large OM   Depression    Essential hypertension    Headache(784.0)    History of migraines   Hyperglycemia    Mixed hyperlipidemia    Neuropathy    Obesity    Port-A-Cath in place 09/30/2021   Pre-diabetes    Seasonal allergies    Sleep apnea    On CPAP    Surgical History: Past Surgical History:  Procedure Laterality Date   BIOPSY  09/23/2021   Procedure: BIOPSY;  Surgeon: Eartha Angelia Sieving, MD;  [facility];  Service: Gastroenterology;;   COLONOSCOPY  06/28/2011   Procedure: COLONOSCOPY;  Surgeon: Claudis RAYMOND Rivet, MD;  [facility];  Service: Endoscopy;  Laterality: N/A;   COLONOSCOPY WITH PROPOFOL  N/A 09/23/2021   Procedure: COLONOSCOPY WITH PROPOFOL ;  Surgeon: Eartha Angelia Sieving, MD;  [facility];  Service: Gastroenterology;  Laterality: N/A;  940   ESOPHAGOGASTRODUODENOSCOPY (EGD) WITH PROPOFOL  N/A 09/23/2021   Procedure: ESOPHAGOGASTRODUODENOSCOPY (EGD) WITH PROPOFOL ;  Surgeon: Eartha Angelia Sieving,  MD;  [facility];  Service: Gastroenterology;  Laterality: N/A;   IR IMAGING GUIDED PORT INSERTION  09/28/2021   IR PARACENTESIS  09/28/2021   JOINT REPLACEMENT Right    hip   LEFT HEART CATHETERIZATION WITH CORONARY ANGIOGRAM N/A 03/31/2013   Procedure: LEFT HEART CATHETERIZATION WITH CORONARY ANGIOGRAM;  Surgeon: Lynwood Schilling, MD;  Location: [facility] CATH LAB;  Service: Cardiovascular;  Laterality: N/A;   POLYPECTOMY  09/23/2021   Procedure: POLYPECTOMY;  Surgeon: Eartha Angelia Sieving, MD;  [facility];  Service: Gastroenterology;;   TOTAL HIP ARTHROPLASTY Left 01/15/2024   Procedure: ARTHROPLASTY, HIP, TOTAL, ANTERIOR APPROACH;  Surgeon: Ernie Cough, MD;  Location: [facility];  Service: Orthopedics;  Laterality: Left;   TURBT  09/11/2009    Social History: Social History   Socioeconomic History   Marital status: Divorced    Spouse name: Not on file   Number of children: 0   Years of education: college   Highest education level: Not on file  Occupational History    Employer: SELF-EMPLOYED  Tobacco Use   Smoking status: Former    Current packs/day: 0.00    Average packs/day: 0.5 packs/day for 10.0 years (5.0 ttl pk-yrs)    Types: Cigarettes    Start date: 07/09/2001    Quit date: 07/10/2011    Years since quitting: 12.6   Smokeless tobacco: Never   Tobacco comments:    Quit several yrs prior to 03/2013.  Vaping Use   Vaping status: Never Used  Substance and Sexual Activity   Alcohol use: Yes    Alcohol/week: 0.0 standard drinks of alcohol    Comment: Occasional   Drug use: No   Sexual activity: Not on file  Other Topics Concern   Not on file  Social History Narrative   Not on file   Social Drivers of Health   Financial Resource Strain: Not on file  Food Insecurity: No Food Insecurity (01/15/2024)   Hunger Vital Sign    Worried About Running Out of Food in the Last Year: Never true    Ran Out of Food in the Last Year: Never true  Transportation Needs: No Transportation Needs (01/15/2024)   PRAPARE - Administrator, Civil Service (Medical): No    Lack of Transportation (Non-Medical): No  Physical Activity: Not on file  Stress: Not on file  Social Connections: Moderately Isolated (01/15/2024)   Social Connection and Isolation Panel    Frequency of Communication with Friends and Family: More than three times a week    Frequency of Social Gatherings with Friends and Family: More than three times a week    Attends Religious Services: More than 4 times per year    Active Member of [employer] or Organizations: No    Attends Banker Meetings: Never    Marital Status: Divorced  Catering manager Violence: Not At Risk  (01/15/2024)   Humiliation, Afraid, Rape, and Kick questionnaire    Fear of Current or Ex-Partner: No    Emotionally Abused: No    Physically Abused: No    Sexually Abused: No    Family History: Family History  Problem Relation Age of Onset   COPD Father     Current Medications:  Current Outpatient Medications:    acetaminophen  (TYLENOL ) 500 MG tablet, Take 2 tablets (1,000 mg total) by mouth every 6 (six) hours., Disp: , Rfl:    amLODipine  (NORVASC ) 10 MG tablet, Take 1 tablet (10 mg total) by mouth  daily., Disp: 90 tablet, Rfl: 3   bumetanide  (BUMEX ) 2 MG tablet, Take 1 tablet (2 mg total) by mouth daily as needed. (Patient taking differently: Take 2 mg by mouth daily.), Disp: , Rfl:    buPROPion  (WELLBUTRIN  XL) 300 MG 24 hr tablet, Take 300 mg by mouth daily., Disp: , Rfl:    cetirizine (ZYRTEC) 10 MG tablet, Take 10 mg by mouth daily., Disp: , Rfl:    cyclobenzaprine  (FLEXERIL ) 10 MG tablet, Take 10 mg by mouth every 6 (six) hours as needed., Disp: , Rfl:    docusate sodium (COLACE) 100 MG capsule, Take 100-200 mg by mouth at bedtime as needed for mild constipation., Disp: , Rfl:    Durvalumab  (IMFINZI  IV), Inject into the vein every 21 ( twenty-one) days., Disp: , Rfl:    Eszopiclone 3 MG TABS, Take 3 mg by mouth at bedtime., Disp: , Rfl:    famotidine  (PEPCID ) 20 MG tablet, Take 20 mg by mouth at bedtime., Disp: , Rfl:    fenofibrate  160 MG tablet, Take 160 mg by mouth daily., Disp: , Rfl:    FLUoxetine  (PROZAC ) 10 MG capsule, Take 10 mg by mouth daily., Disp: , Rfl:    gabapentin  (NEURONTIN ) 300 MG capsule, Take 300 mg by mouth every 6 (six) hours as needed (pain)., Disp: , Rfl:    guaiFENesin (MUCINEX) 600 MG 12 hr tablet, Take 600 mg by mouth 2 (two) times daily as needed for cough or to loosen phlegm., Disp: , Rfl:    magnesium  oxide (MAG-OX) 400 (240 Mg) MG tablet, Take 1 tablet (400 mg total) by mouth 2 (two) times daily., Disp: 90 tablet, Rfl: 6   metFORMIN  (GLUCOPHAGE -XR)  500 MG 24 hr tablet, Take 500 mg by mouth 2 (two) times daily., Disp: , Rfl:    Multiple Vitamin (MULTIVITAMIN WITH MINERALS) TABS tablet, Take 1 tablet by mouth daily., Disp: , Rfl:    nitroGLYCERIN  (NITROSTAT ) 0.4 MG SL tablet, Place 0.4 mg under the tongue every 5 (five) minutes x 3 doses as needed for chest pain (if no relief after 2nd dose, proceed to ED or call 911)., Disp: , Rfl:    NON FORMULARY, Pt uses a cpap, Disp: , Rfl:    olmesartan  (BENICAR ) 40 MG tablet, TAKE ONE TABLET BY MOUTH EVERY DAY, Disp: 90 tablet, Rfl: 2   omeprazole (PRILOSEC) 40 MG capsule, Take 40 mg by mouth daily., Disp: , Rfl:    oxyCODONE  (OXY IR/ROXICODONE ) 5 MG immediate release tablet, Take 1-2 tablets (5-10 mg total) by mouth every 4 (four) hours as needed for severe pain (pain score 7-10). Take 2 tabs only for severe pain, Disp: 42 tablet, Rfl: 0   polyethylene glycol (MIRALAX  / GLYCOLAX ) 17 g packet, Take 17 g by mouth 2 (two) times daily., Disp: 14 each, Rfl: 0   pramipexole  (MIRAPEX ) 0.75 MG tablet, Take 1 tablet (0.75 mg total) by mouth 3 (three) times daily., Disp: 90 tablet, Rfl: 0   prochlorperazine  (COMPAZINE ) 10 MG tablet, Take 1 tablet (10 mg total) by mouth every 6 (six) hours as needed for nausea or vomiting., Disp: 30 tablet, Rfl: 2   Saw Palmetto, Serenoa repens, (SAW PALMETTO PO), Take 1 capsule by mouth daily., Disp: , Rfl:    Semaglutide  14 MG TABS, Take 14 mg by mouth daily., Disp: , Rfl:    spironolactone  (ALDACTONE ) 100 MG tablet, Take 1 tablet (100 mg total) by mouth daily., Disp: 30 tablet, Rfl: 1   tamsulosin  (FLOMAX ) 0.4 MG  CAPS capsule, Take 0.4 mg by mouth daily., Disp: , Rfl:    tiZANidine  (ZANAFLEX ) 4 MG tablet, Take 1 tablet (4 mg total) by mouth every 8 (eight) hours as needed for muscle spasms., Disp: 40 tablet, Rfl: 1   traMADol  (ULTRAM ) 50 MG tablet, TAKE ONE TABLET BY MOUTH AT BEDTIME AS NEEDED FOR PAIN, Disp: , Rfl:    zaleplon  (SONATA ) 10 MG capsule, Take 1 capsule (10 mg total)  by mouth at bedtime as needed for sleep., Disp: 30 capsule, Rfl: 5 No current facility-administered medications for this visit.  Facility-Administered Medications Ordered in Other Visits:    magnesium  sulfate 2 GM/50ML IVPB, , , ,    Allergies: No Known Allergies  REVIEW OF SYSTEMS:   Review of Systems  Constitutional:  Negative for chills, fatigue and fever.  HENT:   Negative for lump/mass, mouth sores, nosebleeds, sore throat and trouble swallowing.   Eyes:  Negative for eye problems.  Respiratory:  Negative for cough and shortness of breath.   Cardiovascular:  Negative for chest pain, leg swelling and palpitations.  Gastrointestinal:  Positive for abdominal pain (7/10 severity), constipation and nausea. Negative for diarrhea and vomiting.  Genitourinary:  Negative for bladder incontinence, difficulty urinating, dysuria, frequency, hematuria and nocturia.   Musculoskeletal:  Negative for arthralgias, back pain, flank pain, myalgias and neck pain.  Skin:  Negative for itching and rash.  Neurological:  Positive for numbness (and tingling in hands and feet, 7/10 pain severity). Negative for dizziness and headaches.  Hematological:  Does not bruise/bleed easily.  Psychiatric/Behavioral:  Positive for sleep disturbance. Negative for depression and suicidal ideas. The patient is not nervous/anxious.   All other systems reviewed and are negative.    VITALS:   There were no vitals taken for this visit.  Wt Readings from Last 3 Encounters:  03/04/24 292 lb 5.3 oz (132.6 kg)  02/26/24 299 lb 14.4 oz (136 kg)  02/12/24 (!) 302 lb 7.5 oz (137.2 kg)    There is no height or weight on file to calculate BMI.  Performance status (ECOG): 1 - Symptomatic but completely ambulatory  PHYSICAL EXAM:   Physical Exam Vitals and nursing note reviewed. Exam conducted with a chaperone present.  Constitutional:      Appearance: Normal appearance.   Cardiovascular:     Rate and Rhythm: Normal  rate and regular rhythm.     Pulses: Normal pulses.     Heart sounds: Normal heart sounds.  Pulmonary:     Effort: Pulmonary effort is normal.     Breath sounds: Normal breath sounds.  Abdominal:     Palpations: Abdomen is soft. There is no hepatomegaly, splenomegaly or mass.     Tenderness: There is no abdominal tenderness.   Musculoskeletal:     Right lower leg: No edema.     Left lower leg: No edema.  Lymphadenopathy:     Cervical: No cervical adenopathy.     Right cervical: No superficial, deep or posterior cervical adenopathy.    Left cervical: No superficial, deep or posterior cervical adenopathy.     Upper Body:     Right upper body: No supraclavicular or axillary adenopathy.     Left upper body: No supraclavicular or axillary adenopathy.   Neurological:     General: No focal deficit present.     Mental Status: He is alert and oriented to person, place, and time.   Psychiatric:        Mood and Affect: Mood normal.  Behavior: Behavior normal.     LABS:      Latest Ref Rng & Units 03/04/2024    7:56 AM 02/26/2024    7:58 AM 02/12/2024    7:55 AM  CBC  WBC 4.0 - 10.5 K/uL 7.9  7.4  6.2   Hemoglobin 13.0 - 17.0 g/dL 88.3  89.6  89.2   Hematocrit 39.0 - 52.0 % 35.8  32.0  33.7   Platelets 150 - 400 K/uL 434  350  292       Latest Ref Rng & Units 03/04/2024    7:56 AM 02/26/2024    7:58 AM 02/12/2024    7:55 AM  CMP  Glucose 70 - 99 mg/dL 806  800  850   BUN 8 - 23 mg/dL 39  58  31   Creatinine 0.61 - 1.24 mg/dL 8.56  7.78  8.89   Sodium 135 - 145 mmol/L 131  132  132   Potassium 3.5 - 5.1 mmol/L 4.0  4.5  3.7   Chloride 98 - 111 mmol/L 94  95  98   CO2 22 - 32 mmol/L 24  22  26    Calcium  8.9 - 10.3 mg/dL 9.1  9.1  8.9   Total Protein 6.5 - 8.1 g/dL 7.1  6.3  6.4   Total Bilirubin 0.0 - 1.2 mg/dL 0.6  0.6  0.5   Alkaline Phos 38 - 126 U/L 94  102  88   AST 15 - 41 U/L 26  19  19    ALT 0 - 44 U/L 26  19  18       Lab Results  Component Value Date    CEA1 3.4 10/11/2023   /  CEA  Date Value Ref Range Status  10/11/2023 3.4 0.0 - 4.7 ng/mL Final    Comment:    (NOTE)                             Nonsmokers          <3.9                             Smokers             <5.6 Roche [employer]ostics Electrochemiluminescence Immunoassay (ECLIA) Values obtained with different assay methods or kits cannot be used interchangeably.  Results cannot be interpreted as absolute evidence of the presence or absence of malignant disease. Performed At: [facility] [address] [place], [place] [id number] Jennette Shorter MD Ey:[id number]    No results found for: PSA1 Lab Results  Component Value Date   RJW800 90 (H) 12/06/2023   No results found for: CAN125  No results found for: STEPHANY RINGS, A1GS, A2GS, BETS, BETA2SER, GAMS, MSPIKE, SPEI Lab Results  Component Value Date   TIBC 428 01/29/2023   TIBC 457 (H) 01/04/2022   FERRITIN 183 01/29/2023   FERRITIN 522 (H) 01/04/2022   IRONPCTSAT 15 01/29/2023   IRONPCTSAT 16 (L) 01/04/2022   Lab Results  Component Value Date   LDH 266 (H) 10/05/2021   LDH 242 (H) 09/01/2021     STUDIES:   US  ASCITES (ABDOMEN LIMITED) Result Date: 02/13/2024 INDICATION: 68 year old male. History of cholangiocarcinoma with peritoneal carcinomatosis. Patient endorsing abdominal distension. Request is for therapeutic paracentesis EXAM: ULTRASOUND ABDOMEN LIMITED FINDINGS: Imaging of all 4 quadrants of the abdomen reveal trace ascites. IMPRESSION: Trace amount of peritoneal  fluid. Insufficient to perform safe paracentesis. Read by: Delon Beagle, NP Electronically Signed   By: Ester Sides M.D.   On: 02/13/2024 12:15

## 2024-03-05 ENCOUNTER — Other Ambulatory Visit: Payer: Self-pay | Admitting: Hematology

## 2024-03-06 ENCOUNTER — Inpatient Hospital Stay

## 2024-03-06 VITALS — BP 124/52 | HR 80 | Temp 96.7°F | Resp 20

## 2024-03-06 DIAGNOSIS — C221 Intrahepatic bile duct carcinoma: Secondary | ICD-10-CM

## 2024-03-06 DIAGNOSIS — Z5112 Encounter for antineoplastic immunotherapy: Secondary | ICD-10-CM | POA: Diagnosis not present

## 2024-03-06 MED ORDER — PRAMIPEXOLE DIHYDROCHLORIDE 0.25 MG PO TABS
0.7500 mg | ORAL_TABLET | Freq: Once | ORAL | Status: AC
  Administered 2024-03-06: 0.75 mg via ORAL
  Filled 2024-03-06: qty 3

## 2024-03-06 MED ORDER — HEPARIN SOD (PORK) LOCK FLUSH 100 UNIT/ML IV SOLN
500.0000 [IU] | Freq: Once | INTRAVENOUS | Status: AC | PRN
  Administered 2024-03-06: 500 [IU]

## 2024-03-06 MED ORDER — SODIUM CHLORIDE 0.9% FLUSH
10.0000 mL | Freq: Once | INTRAVENOUS | Status: AC | PRN
  Administered 2024-03-06: 10 mL

## 2024-03-06 MED ORDER — MAGNESIUM SULFATE 2 GM/50ML IV SOLN
2.0000 g | Freq: Once | INTRAVENOUS | Status: AC
  Administered 2024-03-06: 2 g via INTRAVENOUS
  Filled 2024-03-06: qty 50

## 2024-03-06 MED ORDER — DICYCLOMINE HCL 10 MG PO CAPS
20.0000 mg | ORAL_CAPSULE | Freq: Once | ORAL | Status: AC
  Administered 2024-03-06: 20 mg via ORAL
  Filled 2024-03-06: qty 2

## 2024-03-06 MED ORDER — POTASSIUM CHLORIDE IN NACL 20-0.9 MEQ/L-% IV SOLN
Freq: Once | INTRAVENOUS | Status: AC
  Filled 2024-03-06: qty 1000

## 2024-03-06 NOTE — Progress Notes (Signed)
 Patient presents today for infusion of Normal Saline with 20mEq of potassium and Magnesium  2G Infusion.  Patients port flushed without difficulty.  Good blood return noted with no bruising or swelling noted at site. VSS, will continue with infusion.  Patient c/o feeling bubbly in abd with some cramping and increase in his restless legs. Patient unsure if he took his Mirapex  today. Patient over halfway complete with his fluids. Dr Katragadda made aware, patient to finish fluids and order given for Mirapex  and Bentyl.  Patient infusion complete. No complaints voiced voiced.  Patient left ambulatory in stable condition.  Vital signs stable at discharge.  Follow up as scheduled.

## 2024-03-06 NOTE — Patient Instructions (Signed)
 [facility] [place] - A DEPT OF [facility]  Discharge Instructions: Thank you for choosing [facility] to provide your oncology and hematology care.  If you have a lab appointment with the Cancer Center - please note that after April 8th, 2024, all labs will be drawn in the cancer center.  You do not have to check in or register with the main entrance as you have in the past but will complete your check-in in the cancer center.  Wear comfortable clothing and clothing appropriate for easy access to any Portacath or PICC line.   We strive to give you quality time with your provider. You may need to reschedule your appointment if you arrive late (15 or more minutes).  Arriving late affects you and other patients whose appointments are after yours.  Also, if you miss three or more appointments without notifying the office, you may be dismissed from the clinic at the provider's discretion.      For prescription refill requests, have your pharmacy contact our office and allow 72 hours for refills to be completed.    Today you received the following Normal Saline with 20 mEq of potassium and Magnesium  2g infusion.   To help prevent nausea and vomiting after your treatment, we encourage you to take your nausea medication as directed.  BELOW ARE SYMPTOMS THAT SHOULD BE REPORTED IMMEDIATELY: *FEVER GREATER THAN 100.4 F (38 C) OR HIGHER *CHILLS OR SWEATING *NAUSEA AND VOMITING THAT IS NOT CONTROLLED WITH YOUR NAUSEA MEDICATION *UNUSUAL SHORTNESS OF BREATH *UNUSUAL BRUISING OR BLEEDING *URINARY PROBLEMS (pain or burning when urinating, or frequent urination) *BOWEL PROBLEMS (unusual diarrhea, constipation, pain near the anus) TENDERNESS IN MOUTH AND THROAT WITH OR WITHOUT PRESENCE OF ULCERS (sore throat, sores in mouth, or a toothache) UNUSUAL RASH, SWELLING OR PAIN  UNUSUAL VAGINAL DISCHARGE OR ITCHING   Items with * indicate a potential emergency and should be  followed up as soon as possible or go to the Emergency Department if any problems should occur.  Please show the CHEMOTHERAPY ALERT CARD or IMMUNOTHERAPY ALERT CARD at check-in to the Emergency Department and triage nurse.  Should you have questions after your visit or need to cancel or reschedule your appointment, please contact [facility] [place] - A DEPT OF [facility] [phone]  and follow the prompts.  Office hours are 8:00 a.m. to 4:30 p.m. Monday - Friday. Please note that voicemails left after 4:00 p.m. may not be returned until the following business day.  We are closed weekends and major holidays. You have access to a nurse at all times for urgent questions. Please call the main number to the clinic [phone] and follow the prompts.  For any non-urgent questions, you may also contact your provider using MyChart. We now offer e-Visits for anyone 30 and older to request care online for non-urgent symptoms. For details visit [email or web address].   Also download the MyChart app! Go to the app store, search MyChart, open the app, select [place], and log in with your MyChart username and password.

## 2024-03-06 NOTE — Progress Notes (Signed)
 Message received from Dr. Katragadda to give Bentyl 20 mg prior to discharge for abdominal cramping x 1 dose. Pramipexole  0.75 mg given PO for restless legs.

## 2024-03-11 ENCOUNTER — Inpatient Hospital Stay

## 2024-03-11 ENCOUNTER — Inpatient Hospital Stay: Attending: Hematology | Admitting: Hematology

## 2024-03-17 ENCOUNTER — Encounter: Payer: Self-pay | Admitting: Hematology

## 2024-03-18 ENCOUNTER — Inpatient Hospital Stay: Attending: Hematology

## 2024-03-18 ENCOUNTER — Inpatient Hospital Stay (HOSPITAL_BASED_OUTPATIENT_CLINIC_OR_DEPARTMENT_OTHER): Admitting: Hematology

## 2024-03-18 ENCOUNTER — Inpatient Hospital Stay

## 2024-03-18 VITALS — BP 116/67 | HR 87 | Temp 97.2°F | Resp 20

## 2024-03-18 VITALS — BP 105/52 | HR 90 | Temp 97.6°F | Resp 20 | Wt 297.0 lb

## 2024-03-18 DIAGNOSIS — Z5112 Encounter for antineoplastic immunotherapy: Secondary | ICD-10-CM | POA: Diagnosis present

## 2024-03-18 DIAGNOSIS — C786 Secondary malignant neoplasm of retroperitoneum and peritoneum: Secondary | ICD-10-CM

## 2024-03-18 DIAGNOSIS — M545 Low back pain, unspecified: Secondary | ICD-10-CM | POA: Diagnosis not present

## 2024-03-18 DIAGNOSIS — Z87891 Personal history of nicotine dependence: Secondary | ICD-10-CM | POA: Insufficient documentation

## 2024-03-18 DIAGNOSIS — G479 Sleep disorder, unspecified: Secondary | ICD-10-CM | POA: Diagnosis not present

## 2024-03-18 DIAGNOSIS — Z8551 Personal history of malignant neoplasm of bladder: Secondary | ICD-10-CM | POA: Diagnosis not present

## 2024-03-18 DIAGNOSIS — G629 Polyneuropathy, unspecified: Secondary | ICD-10-CM | POA: Insufficient documentation

## 2024-03-18 DIAGNOSIS — R609 Edema, unspecified: Secondary | ICD-10-CM | POA: Diagnosis not present

## 2024-03-18 DIAGNOSIS — Z79620 Long term (current) use of immunosuppressive biologic: Secondary | ICD-10-CM | POA: Insufficient documentation

## 2024-03-18 DIAGNOSIS — Z95828 Presence of other vascular implants and grafts: Secondary | ICD-10-CM

## 2024-03-18 DIAGNOSIS — Z79899 Other long term (current) drug therapy: Secondary | ICD-10-CM | POA: Diagnosis not present

## 2024-03-18 DIAGNOSIS — R5383 Other fatigue: Secondary | ICD-10-CM | POA: Insufficient documentation

## 2024-03-18 DIAGNOSIS — R970 Elevated carcinoembryonic antigen [CEA]: Secondary | ICD-10-CM | POA: Diagnosis not present

## 2024-03-18 DIAGNOSIS — C221 Intrahepatic bile duct carcinoma: Secondary | ICD-10-CM

## 2024-03-18 DIAGNOSIS — Z5111 Encounter for antineoplastic chemotherapy: Secondary | ICD-10-CM | POA: Diagnosis present

## 2024-03-18 LAB — CBC WITH DIFFERENTIAL/PLATELET
Abs Immature Granulocytes: 0.04 K/uL (ref 0.00–0.07)
Basophils Absolute: 0 K/uL (ref 0.0–0.1)
Basophils Relative: 1 %
Eosinophils Absolute: 0.1 K/uL (ref 0.0–0.5)
Eosinophils Relative: 2 %
HCT: 33.1 % — ABNORMAL LOW (ref 39.0–52.0)
Hemoglobin: 10.3 g/dL — ABNORMAL LOW (ref 13.0–17.0)
Immature Granulocytes: 1 %
Lymphocytes Relative: 16 %
Lymphs Abs: 0.9 K/uL (ref 0.7–4.0)
MCH: 29.2 pg (ref 26.0–34.0)
MCHC: 31.1 g/dL (ref 30.0–36.0)
MCV: 93.8 fL (ref 80.0–100.0)
Monocytes Absolute: 0.7 K/uL (ref 0.1–1.0)
Monocytes Relative: 12 %
Neutro Abs: 4 K/uL (ref 1.7–7.7)
Neutrophils Relative %: 68 %
Platelets: 234 K/uL (ref 150–400)
RBC: 3.53 MIL/uL — ABNORMAL LOW (ref 4.22–5.81)
RDW: 15.5 % (ref 11.5–15.5)
WBC: 5.7 K/uL (ref 4.0–10.5)
nRBC: 0.5 % — ABNORMAL HIGH (ref 0.0–0.2)

## 2024-03-18 LAB — COMPREHENSIVE METABOLIC PANEL WITH GFR
ALT: 21 U/L (ref 0–44)
AST: 23 U/L (ref 15–41)
Albumin: 3.1 g/dL — ABNORMAL LOW (ref 3.5–5.0)
Alkaline Phosphatase: 66 U/L (ref 38–126)
Anion gap: 10 (ref 5–15)
BUN: 20 mg/dL (ref 8–23)
CO2: 24 mmol/L (ref 22–32)
Calcium: 8.7 mg/dL — ABNORMAL LOW (ref 8.9–10.3)
Chloride: 100 mmol/L (ref 98–111)
Creatinine, Ser: 1.19 mg/dL (ref 0.61–1.24)
GFR, Estimated: >60 mL/min (ref >60)
Glucose, Bld: 137 mg/dL — ABNORMAL HIGH (ref 70–99)
Potassium: 4 mmol/L (ref 3.5–5.1)
Sodium: 134 mmol/L — ABNORMAL LOW (ref 135–145)
Total Bilirubin: 0.5 mg/dL (ref 0.0–1.2)
Total Protein: 6.1 g/dL — ABNORMAL LOW (ref 6.5–8.1)

## 2024-03-18 LAB — MAGNESIUM: Magnesium: 2 mg/dL (ref 1.7–2.4)

## 2024-03-18 LAB — TSH: TSH: 3.577 u[IU]/mL (ref 0.350–4.500)

## 2024-03-18 MED ORDER — PALONOSETRON HCL INJECTION 0.25 MG/5ML
0.2500 mg | Freq: Once | INTRAVENOUS | Status: AC
  Administered 2024-03-18: 0.25 mg via INTRAVENOUS
  Filled 2024-03-18: qty 5

## 2024-03-18 MED ORDER — HEPARIN SOD (PORK) LOCK FLUSH 100 UNIT/ML IV SOLN
500.0000 [IU] | Freq: Once | INTRAVENOUS | Status: AC | PRN
  Administered 2024-03-18: 500 [IU]

## 2024-03-18 MED ORDER — MAGNESIUM SULFATE 2 GM/50ML IV SOLN
2.0000 g | Freq: Once | INTRAVENOUS | Status: AC
  Administered 2024-03-18: 2 g via INTRAVENOUS
  Filled 2024-03-18: qty 50

## 2024-03-18 MED ORDER — SODIUM CHLORIDE 0.9% FLUSH
10.0000 mL | INTRAVENOUS | Status: DC | PRN
  Administered 2024-03-18: 10 mL

## 2024-03-18 MED ORDER — SODIUM CHLORIDE 0.9 % IV SOLN
25.0000 mg/m2 | Freq: Once | INTRAVENOUS | Status: AC
  Administered 2024-03-18: 65 mg via INTRAVENOUS
  Filled 2024-03-18: qty 65

## 2024-03-18 MED ORDER — SODIUM CHLORIDE 0.9 % IV SOLN: INTRAVENOUS | Status: DC

## 2024-03-18 MED ORDER — DEXAMETHASONE SODIUM PHOSPHATE 10 MG/ML IJ SOLN
10.0000 mg | Freq: Once | INTRAMUSCULAR | Status: AC
  Administered 2024-03-18: 10 mg via INTRAVENOUS
  Filled 2024-03-18: qty 1

## 2024-03-18 MED ORDER — SODIUM CHLORIDE 0.9% FLUSH
10.0000 mL | INTRAVENOUS | Status: DC | PRN
  Administered 2024-03-18: 10 mL via INTRAVENOUS

## 2024-03-18 MED ORDER — SODIUM CHLORIDE 0.9 % IV SOLN
150.0000 mg | Freq: Once | INTRAVENOUS | Status: AC
  Administered 2024-03-18: 150 mg via INTRAVENOUS
  Filled 2024-03-18: qty 150

## 2024-03-18 MED ORDER — SODIUM CHLORIDE 0.9 % IV SOLN
1500.0000 mg | Freq: Once | INTRAVENOUS | Status: AC
  Administered 2024-03-18: 1500 mg via INTRAVENOUS
  Filled 2024-03-18: qty 30

## 2024-03-18 MED ORDER — SODIUM CHLORIDE 0.9 % IV SOLN: Freq: Once | INTRAVENOUS | Status: DC

## 2024-03-18 MED ORDER — SODIUM CHLORIDE 0.9 % IV SOLN
800.0000 mg/m2 | Freq: Once | INTRAVENOUS | Status: AC
  Administered 2024-03-18: 2052 mg via INTRAVENOUS
  Filled 2024-03-18: qty 53.97

## 2024-03-18 MED ORDER — POTASSIUM CHLORIDE IN NACL 20-0.9 MEQ/L-% IV SOLN
Freq: Once | INTRAVENOUS | Status: AC
  Filled 2024-03-18: qty 1000

## 2024-03-18 NOTE — Progress Notes (Signed)
 Patient has been examined by Dr. Ellin Saba. Vital signs and labs have been reviewed by MD - ANC, Creatinine, LFTs, hemoglobin, and platelets are within treatment parameters per M.D. - pt may proceed with treatment.  Primary RN and pharmacy notified.

## 2024-03-18 NOTE — Progress Notes (Signed)
 [facility] 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 Darlyn Shona - Reported pressure on the sides of the abdomen with slight pain for the last 1 month.  He also reported pain in the epigastric region. - He had lost 20 pounds in the last 3 to 4 months intentionally, cutting back on sugars.  He was also started on semaglutide . - Colonoscopy on 06/28/2011 with a benign polypoid colonic mucosa in the descending colon.  No evidence of malignancy. - MRI of the brain was negative. - EGD and colonoscopy on 09/23/2018 did not reveal any malignancies. 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  IHC positive for CK7, CDX2 and negative for GATA3.  Findings suggestive of upper GI or pancreaticobiliary primary. - Cycle 1 of gemcitabine , cisplatin  and durvalumab  started on 10/05/2021. - NGS: No targetable mutations.  PD-L1 (SP142) negative.  MSI-stable.  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   2. Social/family history: - He lives at home by himself.  He records voiceover/narrations. - Non-smoker. - Father died of MDS.  Paternal grandfather had prostate cancer.  Maternal grandfather had cancer.  3.  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 Renda.  Reportedly received 1 treatment of intravesical chemo and has been on surveillance since then.  Last  surveillance visit was in 2020.    Plan: 1. Cholangiocarcinoma with peritoneal carcinomatosis: - CT CAP (11/29/2022): Increased burden of peritoneal/mesenteric nodularity and new trace perihepatic fluid. - He was started on chemoimmunotherapy after the last scan.  Cycle 2 was delayed due to hip surgery.  He had cycle 2 on 02/12/2024.  He reported fatigue which started 2 days after chemotherapy, lasting about 3 to 4 days. - He reported discomfort across the epigastric region which is constant in the last 2 weeks.  Also had left upper quadrant pain for few seconds on certain movements and while trying to get in the bed.  This is also 2 weeks duration. - I reviewed labs today: Normal LFTs.  Albumin  3.1.  CBC grossly normal with mild anemia from myelosuppression.  TSH is 3.5. - Proceed with cycle 3-day 1 of chemotherapy today.  Recommend follow-up in 4 weeks with repeat CT CAP with contrast.  2. Fluid retention: - Continue Bumex  2 mg daily as needed.  Metolazone  on hold.  Spironolactone  on hold.  Creatinine improved to 1.19.  3.  Neuropathy/drawing of legs: - Continue Mirapex  3 times daily and gabapentin  300 mg 4 times daily.  4.  Sleeping difficulty: - Continue Lunesta at bedtime as needed.  5.  Hypomagnesemia: - Continue magnesium  twice daily.  Magnesium  is normal at 2.0.   6.  Right-sided lower back pain: - MRI of the lumbar spine and pelvis on 05/30/2023 showed DJD and no evidence of malignancy.    Orders Placed This Encounter  Procedures   CT CHEST ABDOMEN PELVIS W CONTRAST    Standing Status:   Future    Expected Date:  04/18/2024    Expiration Date:   03/18/2025    If indicated for the ordered procedure, I authorize the administration of contrast media per Radiology protocol:   Yes    Does the patient have a contrast media/X-ray dye allergy?:   No    Preferred imaging location?:   [facility]    If indicated for the ordered procedure, I authorize the administration of oral  contrast media per Radiology protocol:   Yes   Cancer antigen 19-9    Standing Status:   Future    Expected Date:   04/01/2024    Expiration Date:   04/01/2025   CEA    Standing Status:   Future    Expected Date:   04/01/2024    Expiration Date:   04/01/2025   Cancer antigen 19-9    Standing Status:   Future    Expected Date:   04/15/2024    Expiration Date:   04/15/2025   CEA    Standing Status:   Future    Expected Date:   04/15/2024    Expiration Date:   04/15/2025      LILLETTE Hummingbird R Teague,acting as a scribe for Alean Stands, MD.,have documented all relevant documentation on the behalf of Marvin Laible, MD,as directed by  Alean Stands, MD while in the presence of Alean Stands, MD.  I, Alean Stands MD, have reviewed the above documentation for accuracy and completeness, and I agree with the above.      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 male presenting to clinic today for follow up of cholangiocarcinoma and peritoneal carcinomatosis. He was last seen by me on 03/04/24.  Today, he states that he is doing well overall. His appetite level is at 85%. His energy level is at 25%.   Lazlo notes getting easily fatigued and his muscles burning after activity. He believes this is attributable to chemotherapy. Fatigue occurs 2 days after treatment and lasts 3-4 days. He denies any skin rashes, nausea, vomiting, diarrhea, or constipation.  Athanasius reports constant bandlike discomfort along the lower portion of the chest wall for the past 2 weeks that occasionally feels hot to the touch. Pain is not worsened with BM's or eating.   He also notes intermittent stabbing pain in the left anterior upper quadrant of the abdomen for at least the past 2 weeks. Pain occurs only with certain movements and lasts a  few seconds at a time.   He has discontinued spironolactone  and is taking Bumex  prn. Tinnitus and neuropathy are stable. He is taking gabapentin  300 mg 4 times a day and Mirapex  TID.  PAST MEDICAL HISTORY:   Past Medical History: Past Medical History:  Diagnosis Date   Anxiety    Arthritis    Bladder cancer (HCC) 09/11/2009   CAD (coronary artery disease), native coronary artery    a. Mildly elevated troponin 03/2013, cath with nonobstructive disease including 50% AV groove distal stenosis before large OM   Depression    Essential hypertension    Headache(784.0)    History of migraines   Hyperglycemia    Mixed hyperlipidemia    Neuropathy    Obesity    Port-A-Cath in place 09/30/2021   Pre-diabetes    Seasonal allergies    Sleep apnea    On CPAP    Surgical History: Past Surgical History:  Procedure Laterality Date   BIOPSY  09/23/2021   Procedure: BIOPSY;  Surgeon: Eartha Angelia Sieving, MD;  Location: [facility];  Service: Gastroenterology;;   COLONOSCOPY  06/28/2011   Procedure:  COLONOSCOPY;  Surgeon: Claudis RAYMOND Rivet, MD;  Location: [facility];  Service: Endoscopy;  Laterality: N/A;   COLONOSCOPY WITH PROPOFOL  N/A 09/23/2021   Procedure: COLONOSCOPY WITH PROPOFOL ;  Surgeon: Eartha Angelia Sieving, MD;  Location: [facility];  Service: Gastroenterology;  Laterality: N/A;  940   ESOPHAGOGASTRODUODENOSCOPY (EGD) WITH PROPOFOL  N/A 09/23/2021   Procedure: ESOPHAGOGASTRODUODENOSCOPY (EGD) WITH PROPOFOL ;  Surgeon: Eartha Angelia Sieving, MD;  Location: [facility];  Service: Gastroenterology;  Laterality: N/A;   IR IMAGING GUIDED PORT INSERTION  09/28/2021   IR PARACENTESIS  09/28/2021   JOINT REPLACEMENT Right    hip   LEFT HEART CATHETERIZATION WITH CORONARY ANGIOGRAM N/A 03/31/2013   Procedure: LEFT HEART CATHETERIZATION WITH CORONARY ANGIOGRAM;  Surgeon: Lynwood Schilling, MD;  Location: [facility] CATH LAB;  Service: Cardiovascular;  Laterality: N/A;   POLYPECTOMY  09/23/2021   Procedure: POLYPECTOMY;  Surgeon: Eartha Angelia Sieving, MD;  Location: [facility];  Service: Gastroenterology;;   TOTAL HIP ARTHROPLASTY Left 01/15/2024   Procedure: ARTHROPLASTY, HIP, TOTAL, ANTERIOR APPROACH;  Surgeon: Ernie Cough, MD;  Location: [facility];  Service: Orthopedics;  Laterality: Left;   TURBT  09/11/2009    Social History: Social History   Socioeconomic History   Marital status: Divorced    Spouse name: Not on file   Number of children: 0   Years of education: college   Highest education level: Not on file  Occupational History    Employer: SELF-EMPLOYED  Tobacco Use   Smoking status: Former    Current packs/day: 0.00    Average packs/day: 0.5 packs/day for 10.0 years (5.0 ttl pk-yrs)    Types: Cigarettes    Start date: 07/09/2001    Quit date: 07/10/2011    Years since quitting: 12.6   Smokeless tobacco: Never   Tobacco comments:    Quit several yrs prior to 03/2013.  Vaping Use   Vaping status: Never Used  Substance and Sexual Activity   Alcohol use: Yes     Alcohol/week: 0.0 standard drinks of alcohol    Comment: Occasional   Drug use: No   Sexual activity: Not on file  Other Topics Concern   Not on file  Social History Narrative   Not on file   Social Drivers of Health   Financial Resource Strain: Not on file  Food Insecurity: No Food Insecurity (  01/15/2024)   Hunger Vital Sign    Worried About Running Out of Food in the Last Year: Never true    Ran Out of Food in the Last Year: Never true  Transportation Needs: No Transportation Needs (01/15/2024)   PRAPARE - Administrator, Civil Service (Medical): No    Lack of Transportation (Non-Medical): No  Physical Activity: Not on file  Stress: Not on file  Social Connections: Moderately Isolated (01/15/2024)   Social Connection and Isolation Panel    Frequency of Communication with Friends and Family: More than three times a week    Frequency of Social Gatherings with Friends and Family: More than three times a week    Attends Religious Services: More than 4 times per year    Active Member of [employer] or Organizations: No    Attends Banker Meetings: Never    Marital Status: Divorced  Catering manager Violence: Not At Risk (01/15/2024)   Humiliation, Afraid, Rape, and Kick questionnaire    Fear of Current or Ex-Partner: No    Emotionally Abused: No    Physically Abused: No    Sexually Abused: No    Family History: Family History  Problem Relation Age of Onset   COPD Father     Current Medications:  Current Outpatient Medications:    acetaminophen  (TYLENOL ) 500 MG tablet, Take 2 tablets (1,000 mg total) by mouth every 6 (six) hours., Disp: , Rfl:    amLODipine  (NORVASC ) 10 MG tablet, Take 1 tablet (10 mg total) by mouth daily., Disp: 90 tablet, Rfl: 3   bumetanide  (BUMEX ) 2 MG tablet, Take 1 tablet (2 mg total) by mouth daily as needed. (Patient taking differently: Take 2 mg by mouth daily.), Disp: , Rfl:    buPROPion  (WELLBUTRIN  XL) 300 MG 24 hr tablet, Take 300  mg by mouth daily., Disp: , Rfl:    cetirizine (ZYRTEC) 10 MG tablet, Take 10 mg by mouth daily., Disp: , Rfl:    cyclobenzaprine  (FLEXERIL ) 10 MG tablet, Take 10 mg by mouth every 6 (six) hours as needed., Disp: , Rfl:    dapagliflozin propanediol (FARXIGA) 10 MG TABS tablet, Take 10 mg by mouth daily., Disp: , Rfl:    docusate sodium (COLACE) 100 MG capsule, Take 100-200 mg by mouth at bedtime as needed for mild constipation., Disp: , Rfl:    Durvalumab  (IMFINZI  IV), Inject into the vein every 21 ( twenty-one) days., Disp: , Rfl:    Eszopiclone 3 MG TABS, Take 3 mg by mouth at bedtime., Disp: , Rfl:    famotidine  (PEPCID ) 20 MG tablet, Take 20 mg by mouth at bedtime., Disp: , Rfl:    fenofibrate  160 MG tablet, Take 160 mg by mouth daily., Disp: , Rfl:    FLUoxetine  (PROZAC ) 10 MG capsule, Take 10 mg by mouth daily., Disp: , Rfl:    gabapentin  (NEURONTIN ) 300 MG capsule, Take 300 mg by mouth every 6 (six) hours as needed (pain)., Disp: , Rfl:    guaiFENesin (MUCINEX) 600 MG 12 hr tablet, Take 600 mg by mouth 2 (two) times daily as needed for cough or to loosen phlegm., Disp: , Rfl:    magnesium  oxide (MAG-OX) 400 (240 Mg) MG tablet, Take 1 tablet (400 mg total) by mouth 2 (two) times daily., Disp: 90 tablet, Rfl: 6   metFORMIN  (GLUCOPHAGE -XR) 500 MG 24 hr tablet, Take 500 mg by mouth 2 (two) times daily., Disp: , Rfl:    Multiple Vitamin (MULTIVITAMIN WITH MINERALS)  TABS tablet, Take 1 tablet by mouth daily., Disp: , Rfl:    nitroGLYCERIN  (NITROSTAT ) 0.4 MG SL tablet, Place 0.4 mg under the tongue every 5 (five) minutes x 3 doses as needed for chest pain (if no relief after 2nd dose, proceed to ED or call 911)., Disp: , Rfl:    NON FORMULARY, Pt uses a cpap, Disp: , Rfl:    olmesartan  (BENICAR ) 40 MG tablet, TAKE ONE TABLET BY MOUTH EVERY DAY, Disp: 90 tablet, Rfl: 2   omeprazole (PRILOSEC) 40 MG capsule, Take 40 mg by mouth daily., Disp: , Rfl:    oxyCODONE  (OXY IR/ROXICODONE ) 5 MG immediate  release tablet, Take 1-2 tablets (5-10 mg total) by mouth every 4 (four) hours as needed for severe pain (pain score 7-10). Take 2 tabs only for severe pain, Disp: 42 tablet, Rfl: 0   polyethylene glycol (MIRALAX  / GLYCOLAX ) 17 g packet, Take 17 g by mouth 2 (two) times daily., Disp: 14 each, Rfl: 0   pramipexole  (MIRAPEX ) 0.75 MG tablet, Take 1 tablet (0.75 mg total) by mouth 3 (three) times daily., Disp: 90 tablet, Rfl: 0   prochlorperazine  (COMPAZINE ) 10 MG tablet, Take 1 tablet (10 mg total) by mouth every 6 (six) hours as needed for nausea or vomiting., Disp: 30 tablet, Rfl: 2   Saw Palmetto, Serenoa repens, (SAW PALMETTO PO), Take 1 capsule by mouth daily., Disp: , Rfl:    Semaglutide  14 MG TABS, Take 14 mg by mouth daily., Disp: , Rfl:    spironolactone  (ALDACTONE ) 100 MG tablet, Take 1 tablet (100 mg total) by mouth daily., Disp: 30 tablet, Rfl: 1   tamsulosin  (FLOMAX ) 0.4 MG CAPS capsule, Take 0.4 mg by mouth daily., Disp: , Rfl:    tiZANidine  (ZANAFLEX ) 4 MG tablet, Take 1 tablet (4 mg total) by mouth every 8 (eight) hours as needed for muscle spasms., Disp: 40 tablet, Rfl: 1   traMADol  (ULTRAM ) 50 MG tablet, TAKE ONE TABLET BY MOUTH AT BEDTIME AS NEEDED FOR PAIN, Disp: , Rfl:    zaleplon  (SONATA ) 10 MG capsule, TAKE ONE CAPSULE BY MOUTH AT BEDTIME AS NEEDED FOR SLEEP, Disp: 30 capsule, Rfl: 5 No current facility-administered medications for this visit.  Facility-Administered Medications Ordered in Other Visits:    0.9 %  sodium chloride  infusion, , Intravenous, Continuous, Rogers Hai, MD, Stopped at 03/18/24 1514   0.9 %  sodium chloride  infusion, , Intravenous, Once, Rogers Hai, MD, Stopped at 03/18/24 1512   magnesium  sulfate 2 GM/50ML IVPB, , , ,    sodium chloride  flush (NS) 0.9 % injection 10 mL, 10 mL, Intracatheter, PRN, Jeannemarie Sawaya, MD, 10 mL at 03/18/24 1514   Allergies: No Known Allergies  REVIEW OF SYSTEMS:   Review of Systems   Constitutional:  Negative for chills, fatigue and fever.  HENT:   Negative for lump/mass, mouth sores, nosebleeds, sore throat and trouble swallowing.   Eyes:  Negative for eye problems.  Respiratory:  Positive for cough and shortness of breath.   Cardiovascular:  Negative for chest pain, leg swelling and palpitations.  Gastrointestinal:  Positive for abdominal pain (LUQ, 6/10 severity) and constipation (occasional). Negative for diarrhea, nausea and vomiting.  Genitourinary:  Negative for bladder incontinence, difficulty urinating, dysuria, frequency, hematuria and nocturia.   Musculoskeletal:  Negative for arthralgias, back pain, flank pain, myalgias and neck pain.  Skin:  Negative for itching and rash.  Neurological:  Negative for dizziness, headaches and numbness.       +tingling in feet  Hematological:  Does not bruise/bleed easily.  Psychiatric/Behavioral:  Positive for sleep disturbance. Negative for depression and suicidal ideas. The patient is not nervous/anxious.   All other systems reviewed and are negative.    VITALS:   There were no vitals taken for this visit.  Wt Readings from Last 3 Encounters:  03/18/24 296 lb 15.4 oz (134.7 kg)  03/04/24 292 lb 5.3 oz (132.6 kg)  02/26/24 299 lb 14.4 oz (136 kg)    There is no height or weight on file to calculate BMI.  Performance status (ECOG): 1 - Symptomatic but completely ambulatory  PHYSICAL EXAM:   Physical Exam Vitals and nursing note reviewed. Exam conducted with a chaperone present.  Constitutional:      Appearance: Normal appearance.  Cardiovascular:     Rate and Rhythm: Normal rate and regular rhythm.     Pulses: Normal pulses.     Heart sounds: Normal heart sounds.  Pulmonary:     Effort: Pulmonary effort is normal.     Breath sounds: Normal breath sounds.  Chest:     Comments: +Left lateral lower rib tenderness Abdominal:     Palpations: Abdomen is soft. There is no hepatomegaly, splenomegaly or mass.      Tenderness: There is no abdominal tenderness.  Musculoskeletal:     Right lower leg: No edema.     Left lower leg: No edema.  Lymphadenopathy:     Cervical: No cervical adenopathy.     Right cervical: No superficial, deep or posterior cervical adenopathy.    Left cervical: No superficial, deep or posterior cervical adenopathy.     Upper Body:     Right upper body: No supraclavicular or axillary adenopathy.     Left upper body: No supraclavicular or axillary adenopathy.  Neurological:     General: No focal deficit present.     Mental Status: He is alert and oriented to person, place, and time.  Psychiatric:        Mood and Affect: Mood normal.        Behavior: Behavior normal.     LABS:      Latest Ref Rng & Units 03/18/2024    7:53 AM 03/04/2024    7:56 AM 02/26/2024    7:58 AM  CBC  WBC 4.0 - 10.5 K/uL 5.7  7.9  7.4   Hemoglobin 13.0 - 17.0 g/dL 89.6  88.3  89.6   Hematocrit 39.0 - 52.0 % 33.1  35.8  32.0   Platelets 150 - 400 K/uL 234  434  350       Latest Ref Rng & Units 03/18/2024    7:53 AM 03/04/2024    7:56 AM 02/26/2024    7:58 AM  CMP  Glucose 70 - 99 mg/dL 862  806  800   BUN 8 - 23 mg/dL 20  39  58   Creatinine 0.61 - 1.24 mg/dL 8.80  8.56  7.78   Sodium 135 - 145 mmol/L 134  131  132   Potassium 3.5 - 5.1 mmol/L 4.0  4.0  4.5   Chloride 98 - 111 mmol/L 100  94  95   CO2 22 - 32 mmol/L 24  24  22    Calcium  8.9 - 10.3 mg/dL 8.7  9.1  9.1   Total Protein 6.5 - 8.1 g/dL 6.1  7.1  6.3   Total Bilirubin 0.0 - 1.2 mg/dL 0.5  0.6  0.6   Alkaline Phos 38 - 126 U/L 66  94  102   AST 15 - 41 U/L 23  26  19    ALT 0 - 44 U/L 21  26  19       Lab Results  Component Value Date   CEA1 3.4 10/11/2023   /  CEA  Date Value Ref Range Status  10/11/2023 3.4 0.0 - 4.7 ng/mL Final    Comment:    (NOTE)                             Nonsmokers          <3.9                             Smokers             <5.6 [employer] Electrochemiluminescence  Immunoassay (ECLIA) Values obtained with different assay methods or kits cannot be used interchangeably.  Results cannot be interpreted as absolute evidence of the presence or absence of malignant disease. Performed At: [facility] [address] [place], [place] [id number] Jennette Shorter MD Ey:[id number]    No results found for: PSA1 Lab Results  Component Value Date   RJW800 47 (H) 12/06/2023   No results found for: CAN125  No results found for: TOTALPROTELP, ALBUMINELP, A1GS, A2GS, BETS, BETA2SER, GAMS, MSPIKE, SPEI Lab Results  Component Value Date   TIBC 428 01/29/2023   TIBC 457 (H) 01/04/2022   FERRITIN 183 01/29/2023   FERRITIN 522 (H) 01/04/2022   IRONPCTSAT 15 01/29/2023   IRONPCTSAT 16 (L) 01/04/2022   Lab Results  Component Value Date   LDH 266 (H) 10/05/2021   LDH 242 (H) 09/01/2021     STUDIES:   No results found.

## 2024-03-18 NOTE — Progress Notes (Signed)
 Confirmed dose of cisplatin  25 mg/m2  V.O. Dr Theadore Molt, PharmD

## 2024-03-18 NOTE — Patient Instructions (Signed)
 [facility] - A DEPT OF [place]  Discharge Instructions: Thank you for choosing [facility] to provide your oncology and hematology care.  If you have a lab appointment with the Cancer Center - please note that after April 8th, 2024, all labs will be drawn in the cancer center.  You do not have to check in or register with the main entrance as you have in the past but will complete your check-in in the cancer center.  Wear comfortable clothing and clothing appropriate for easy access to any Portacath or PICC line.   We strive to give you quality time with your provider. You may need to reschedule your appointment if you arrive late (15 or more minutes).  Arriving late affects you and other patients whose appointments are after yours.  Also, if you miss three or more appointments without notifying the office, you may be dismissed from the clinic at the provider's discretion.      For prescription refill requests, have your pharmacy contact our office and allow 72 hours for refills to be completed.    Today you received the following chemotherapy and/or immunotherapy agents Imfinzi /Gemzar /Cisplatin       To help prevent nausea and vomiting after your treatment, we encourage you to take your nausea medication as directed.  BELOW ARE SYMPTOMS THAT SHOULD BE REPORTED IMMEDIATELY: *FEVER GREATER THAN 100.4 F (38 C) OR HIGHER *CHILLS OR SWEATING *NAUSEA AND VOMITING THAT IS NOT CONTROLLED WITH YOUR NAUSEA MEDICATION *UNUSUAL SHORTNESS OF BREATH *UNUSUAL BRUISING OR BLEEDING *URINARY PROBLEMS (pain or burning when urinating, or frequent urination) *BOWEL PROBLEMS (unusual diarrhea, constipation, pain near the anus) TENDERNESS IN MOUTH AND THROAT WITH OR WITHOUT PRESENCE OF ULCERS (sore throat, sores in mouth, or a toothache) UNUSUAL RASH, SWELLING OR PAIN  UNUSUAL VAGINAL DISCHARGE OR ITCHING   Items with * indicate a potential emergency and should  be followed up as soon as possible or go to the Emergency Department if any problems should occur.  Please show the CHEMOTHERAPY ALERT CARD or IMMUNOTHERAPY ALERT CARD at check-in to the Emergency Department and triage nurse.  Should you have questions after your visit or need to cancel or reschedule your appointment, please contact [facility] [place] - A DEPT OF [facility] [phone]  and follow the prompts.  Office hours are 8:00 a.m. to 4:30 p.m. Monday - Friday. Please note that voicemails left after 4:00 p.m. may not be returned until the following business day.  We are closed weekends and major holidays. You have access to a nurse at all times for urgent questions. Please call the main number to the clinic [phone] and follow the prompts.  For any non-urgent questions, you may also contact your provider using MyChart. We now offer e-Visits for anyone 34 and older to request care online for non-urgent symptoms. For details visit [email or web address].   Also download the MyChart app! Go to the app store, search MyChart, open the app, select [place], and log in with your MyChart username and password.

## 2024-03-18 NOTE — Progress Notes (Signed)
 Patient presents today for Imfinzi /Gemzar /Cisplatin  per providers order.  Vital signs and labs reviewed by MD.  Messages received from Isaiah Piety RN/Dr. Rogers patient okay for treatment.  Patient voided over 200 cc urine prior to Cisplatin  administration.  Treatment given today per MD orders.  Stable during infusion without adverse affects.  Vital signs stable.  No complaints at this time.  Discharge from clinic ambulatory in stable condition.  Alert and oriented X 3.  Follow up with [facility] as scheduled.

## 2024-03-18 NOTE — Progress Notes (Signed)
 Patients port flushed without difficulty.  Good blood return noted with no bruising or swelling noted at site.  Patient remains accessed for treatment.

## 2024-03-18 NOTE — Patient Instructions (Signed)

## 2024-03-19 LAB — T4: T4, Total: 7.7 ug/dL (ref 4.5–12.0)

## 2024-03-25 ENCOUNTER — Inpatient Hospital Stay

## 2024-04-01 ENCOUNTER — Inpatient Hospital Stay

## 2024-04-01 ENCOUNTER — Encounter: Payer: Self-pay | Admitting: Hematology

## 2024-04-01 VITALS — BP 129/87 | HR 90 | Temp 98.2°F | Resp 18

## 2024-04-01 DIAGNOSIS — Z95828 Presence of other vascular implants and grafts: Secondary | ICD-10-CM

## 2024-04-01 DIAGNOSIS — Z5112 Encounter for antineoplastic immunotherapy: Secondary | ICD-10-CM | POA: Diagnosis not present

## 2024-04-01 DIAGNOSIS — C221 Intrahepatic bile duct carcinoma: Secondary | ICD-10-CM

## 2024-04-01 LAB — COMPREHENSIVE METABOLIC PANEL WITH GFR
ALT: 20 U/L (ref 0–44)
AST: 27 U/L (ref 15–41)
Albumin: 3.5 g/dL (ref 3.5–5.0)
Alkaline Phosphatase: 74 U/L (ref 38–126)
Anion gap: 12 (ref 5–15)
BUN: 25 mg/dL — ABNORMAL HIGH (ref 8–23)
CO2: 25 mmol/L (ref 22–32)
Calcium: 8.9 mg/dL (ref 8.9–10.3)
Chloride: 93 mmol/L — ABNORMAL LOW (ref 98–111)
Creatinine, Ser: 1.06 mg/dL (ref 0.61–1.24)
GFR, Estimated: >60 mL/min (ref >60)
Glucose, Bld: 156 mg/dL — ABNORMAL HIGH (ref 70–99)
Potassium: 3.1 mmol/L — ABNORMAL LOW (ref 3.5–5.1)
Sodium: 130 mmol/L — ABNORMAL LOW (ref 135–145)
Total Bilirubin: 0.6 mg/dL (ref 0.0–1.2)
Total Protein: 6.4 g/dL — ABNORMAL LOW (ref 6.5–8.1)

## 2024-04-01 LAB — MAGNESIUM: Magnesium: 1.7 mg/dL (ref 1.7–2.4)

## 2024-04-01 LAB — CBC WITH DIFFERENTIAL/PLATELET
Abs Immature Granulocytes: 0.05 K/uL (ref 0.00–0.07)
Basophils Absolute: 0.1 K/uL (ref 0.0–0.1)
Basophils Relative: 1 %
Eosinophils Absolute: 0.1 K/uL (ref 0.0–0.5)
Eosinophils Relative: 2 %
HCT: 34.1 % — ABNORMAL LOW (ref 39.0–52.0)
Hemoglobin: 10.9 g/dL — ABNORMAL LOW (ref 13.0–17.0)
Immature Granulocytes: 1 %
Lymphocytes Relative: 15 %
Lymphs Abs: 0.9 K/uL (ref 0.7–4.0)
MCH: 29 pg (ref 26.0–34.0)
MCHC: 32 g/dL (ref 30.0–36.0)
MCV: 90.7 fL (ref 80.0–100.0)
Monocytes Absolute: 0.9 K/uL (ref 0.1–1.0)
Monocytes Relative: 16 %
Neutro Abs: 3.9 K/uL (ref 1.7–7.7)
Neutrophils Relative %: 65 %
Platelets: 339 K/uL (ref 150–400)
RBC: 3.76 MIL/uL — ABNORMAL LOW (ref 4.22–5.81)
RDW: 15.6 % — ABNORMAL HIGH (ref 11.5–15.5)
WBC: 5.9 K/uL (ref 4.0–10.5)
nRBC: 0 % (ref 0.0–0.2)

## 2024-04-01 MED ORDER — SODIUM CHLORIDE 0.9% FLUSH
10.0000 mL | Freq: Once | INTRAVENOUS | Status: AC
  Administered 2024-04-01: 10 mL via INTRAVENOUS

## 2024-04-01 MED ORDER — SODIUM CHLORIDE 0.9 % IV SOLN
25.0000 mg/m2 | Freq: Once | INTRAVENOUS | Status: AC
  Administered 2024-04-01: 65 mg via INTRAVENOUS
  Filled 2024-04-01: qty 65

## 2024-04-01 MED ORDER — DEXAMETHASONE SODIUM PHOSPHATE 10 MG/ML IJ SOLN
10.0000 mg | Freq: Once | INTRAMUSCULAR | Status: AC
  Administered 2024-04-01: 10 mg via INTRAVENOUS
  Filled 2024-04-01: qty 1

## 2024-04-01 MED ORDER — HEPARIN SOD (PORK) LOCK FLUSH 100 UNIT/ML IV SOLN
500.0000 [IU] | Freq: Once | INTRAVENOUS | Status: AC | PRN
  Administered 2024-04-01: 500 [IU]

## 2024-04-01 MED ORDER — SODIUM CHLORIDE 0.9 % IV SOLN
800.0000 mg/m2 | Freq: Once | INTRAVENOUS | Status: AC
  Administered 2024-04-01: 2052 mg via INTRAVENOUS
  Filled 2024-04-01: qty 53.97

## 2024-04-01 MED ORDER — SODIUM CHLORIDE 0.9% FLUSH
10.0000 mL | INTRAVENOUS | Status: DC | PRN
  Administered 2024-04-01: 10 mL

## 2024-04-01 MED ORDER — POTASSIUM CHLORIDE IN NACL 20-0.9 MEQ/L-% IV SOLN
Freq: Once | INTRAVENOUS | Status: AC
  Filled 2024-04-01: qty 1000

## 2024-04-01 MED ORDER — SODIUM CHLORIDE 0.9 % IV SOLN: Freq: Once | INTRAVENOUS | Status: AC

## 2024-04-01 MED ORDER — MAGNESIUM SULFATE 2 GM/50ML IV SOLN
2.0000 g | Freq: Once | INTRAVENOUS | Status: AC
Start: 2024-04-01 — End: 2024-04-01
  Administered 2024-04-01: 2 g via INTRAVENOUS
  Filled 2024-04-01: qty 50

## 2024-04-01 MED ORDER — SODIUM CHLORIDE 0.9 % IV SOLN: INTRAVENOUS | Status: DC

## 2024-04-01 MED ORDER — PALONOSETRON HCL INJECTION 0.25 MG/5ML
0.2500 mg | Freq: Once | INTRAVENOUS | Status: AC
  Administered 2024-04-01: 0.25 mg via INTRAVENOUS
  Filled 2024-04-01: qty 5

## 2024-04-01 MED ORDER — POTASSIUM CHLORIDE IN NACL 20-0.9 MEQ/L-% IV SOLN
Freq: Once | INTRAVENOUS | Status: DC
Start: 2024-04-01 — End: 2024-04-01
  Filled 2024-04-01: qty 1000

## 2024-04-01 MED ORDER — SODIUM CHLORIDE 0.9 % IV SOLN
150.0000 mg | Freq: Once | INTRAVENOUS | Status: AC
  Administered 2024-04-01: 150 mg via INTRAVENOUS
  Filled 2024-04-01: qty 150

## 2024-04-01 NOTE — Progress Notes (Signed)
 Patient presents today for Cisplatin /Gemzar  infusion. Patient is in satisfactory condition with no new complaints voiced.  Vital signs are stable.  Labs reviewed and all labs are within treatment parameters.  We will proceed with treatment per MD orders.    Patient urinated 325 mL prior to Cisplatin  administration.  Treatment given today per MD orders. Tolerated infusion without adverse affects. Vital signs stable. No complaints at this time. Discharged from clinic ambulatory in stable condition. Alert and oriented x 3. F/U with [facility] as scheduled.

## 2024-04-01 NOTE — Progress Notes (Signed)
   04/01/24 0900  Spiritual Encounters  Type of Visit Initial  Care provided to: Patient  Conversation partners present during encounter Nurse  Referral source Other (comment) (Chaplain making rounds)  Reason for visit  (Introduction to Spiritual Care)  OnCall Visit No  Spiritual Framework  Presenting Themes Meaning/purpose/sources of inspiration;Goals in life/care;Values and beliefs;Significant life change;Caregiving needs;Rituals and practive;Community and relationships  Community/Connection Friend(s);Faith community  Patient Stress Factors None identified  Family Stress Factors Not reviewed  Interventions  Spiritual Care Interventions Made Established relationship of care and support;Reflective listening;Narrative/life review;Explored values/beliefs/practices/strengths;Prayer;Encouragement  Intervention Outcomes  Outcomes Awareness around self/spiritual resourses;Connection to spiritual care;Awareness of support  Spiritual Care Plan  Spiritual Care Issues Still Outstanding Chaplain will continue to follow   Reason for Visit: Chaplain making rounds on the floor visiting infusion Pts  Description of Visit: Arriving in the room I found Marvin Carr seated in a recliner receiving chemo infusion, with no support person present.  I introduced myself as the chaplain for the cancer center and offered a brief education on the role of a chaplain and the support we can offer to our patients, caregivers, and staff.  I began a conversation with him, and Marvin Carr was receptive to talking with me.  As I facilitated life review and storytelling Marvin Carr revealed his connection to Emergency Chaplains out of [place] and [place].  He shared his heart for serving the [employer].  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  To aide in my spiritual assessment, I asked guided  questions intended to explore Marvin Carr's emotional, spiritual, and relational needs and resources.  Marvin Carr does not present as having many needs and he conveys a number of strengths.  He carry's himself as one with wisdom and maturity in life and has a strong sense of purpose. He describes a number of close interpersonal relationship that are supportive to him, but lacks a strong connection to a local spiritual community.  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  Plan of Care: My concerns for Marvin Carr revolve around caregiving and support. At this time Marvin Carr appears to be self- sufficient, but were his health to take a turn he might quickly need more support. Therefore, I will continue to check-in with him on a monthly basis to continue to assess his support needs.   Maude Roll, MDiv  Chaplain, [email or web address] [email or web address] [phone]

## 2024-04-01 NOTE — Patient Instructions (Signed)
 [facility] - A DEPT OF [facility]  Discharge Instructions: Thank you for choosing [facility] to provide your oncology and hematology care.  If you have a lab appointment with the Cancer Center - please note that after April 8th, 2024, all labs will be drawn in the cancer center.  You do not have to check in or register with the main entrance as you have in the past but will complete your check-in in the cancer center.  Wear comfortable clothing and clothing appropriate for easy access to any Portacath or PICC line.   We strive to give you quality time with your provider. You may need to reschedule your appointment if you arrive late (15 or more minutes).  Arriving late affects you and other patients whose appointments are after yours.  Also, if you miss three or more appointments without notifying the office, you may be dismissed from the clinic at the provider's discretion.      For prescription refill requests, have your pharmacy contact our office and allow 72 hours for refills to be completed.    Today you received the following chemotherapy and/or immunotherapy agents Gemzar /Cisplatin    To help prevent nausea and vomiting after your treatment, we encourage you to take your nausea medication as directed.   Gemcitabine  Injection What is this medication? GEMCITABINE  (jem SYE ta been) treats some types of cancer. It works by slowing down the growth of cancer cells. This medicine may be used for other purposes; ask your health care provider or pharmacist if you have questions. COMMON BRAND NAME(S): Gemzar , Infugem  What should I tell my care team before I take this medication? They need to know if you have any of these conditions: Blood disorders Infection Kidney disease Liver disease Lung or breathing disease, such as asthma or COPD Recent or ongoing radiation therapy An unusual or allergic reaction to gemcitabine , other medications,  foods, dyes, or preservatives If you or your partner are pregnant or trying to get pregnant Breast-feeding How should I use this medication? This medication is injected into a vein. It is given by your care team in a hospital or clinic setting. Talk to your care team about the use of this medication in children. Special care may be needed. Overdosage: If you think you have taken too much of this medicine contact a poison control center or emergency room at once. NOTE: This medicine is only for you. Do not share this medicine with others. What if I miss a dose? Keep appointments for follow-up doses. It is important not to miss your dose. Call your care team if you are unable to keep an appointment. What may interact with this medication? Interactions have not been studied. This list may not describe all possible interactions. Give your health care provider a list of all the medicines, herbs, non-prescription drugs, or dietary supplements you use. Also tell them if you smoke, drink alcohol, or use illegal drugs. Some items may interact with your medicine. What should I watch for while using this medication? Your condition will be monitored carefully while you are receiving this medication. This medication may make you feel generally unwell. This is not uncommon, as chemotherapy can affect healthy cells as well as cancer cells. Report any side effects. Continue your course of treatment even though you feel ill unless your care team tells you to stop. In some cases, you may be given additional medications to help with side effects. Follow all  directions for their use. This medication may increase your risk of getting an infection. Call your care team for advice if you get a fever, chills, sore throat, or other symptoms of a cold or flu. Do not treat yourself. Try to avoid being around people who are sick. This medication may increase your risk to bruise or bleed. Call your care team if you notice any  unusual bleeding. Be careful brushing or flossing your teeth or using a toothpick because you may get an infection or bleed more easily. If you have any dental work done, tell your dentist you are receiving this medication. Avoid taking medications that contain aspirin , acetaminophen , ibuprofen, naproxen, or ketoprofen unless instructed by your care team. These medications may hide a fever. Talk to your care team if you or your partner wish to become pregnant or think you might be pregnant. This medication can cause serious birth defects if taken during pregnancy and for 6 months after the last dose. A negative pregnancy test is required before starting this medication. A reliable form of contraception is recommended while taking this medication and for 6 months after the last dose. Talk to your care team about effective forms of contraception. Do not father a child while taking this medication and for 3 months after the last dose. Use a condom while having sex during this time period. Do not breastfeed while taking this medication and for at least 1 week after the last dose. This medication may cause infertility. Talk to your care team if you are concerned about your fertility. What side effects may I notice from receiving this medication? Side effects that you should report to your care team as soon as possible: Allergic reactions--skin rash, itching, hives, swelling of the face, lips, tongue, or throat Capillary leak syndrome--stomach or muscle pain, unusual weakness or fatigue, feeling faint or lightheaded, decrease in the amount of urine, swelling of the ankles, hands, or feet, trouble breathing Infection--fever, chills, cough, sore throat, wounds that don't heal, pain or trouble when passing urine, general feeling of discomfort or being unwell Liver injury--right upper belly pain, loss of appetite, nausea, light-colored stool, dark yellow or brown urine, yellowing skin or eyes, unusual weakness or  fatigue Low red blood cell level--unusual weakness or fatigue, dizziness, headache, trouble breathing Lung injury--shortness of breath or trouble breathing, cough, spitting up blood, chest pain, fever Stomach pain, bloody diarrhea, pale skin, unusual weakness or fatigue, decrease in the amount of urine, which may be signs of hemolytic uremic syndrome Sudden and severe headache, confusion, change in vision, seizures, which may be signs of posterior reversible encephalopathy syndrome (PRES) Unusual bruising or bleeding Side effects that usually do not require medical attention (report to your care team if they continue or are bothersome): Diarrhea Drowsiness Hair loss Nausea Pain, redness, or swelling with sores inside the mouth or throat Vomiting This list may not describe all possible side effects. Call your doctor for medical advice about side effects. You may report side effects to FDA at 1-800-FDA-1088. Where should I keep my medication? This medication is given in a hospital or clinic. It will not be stored at home. NOTE: This sheet is a summary. It may not cover all possible information. If you have questions about this medicine, talk to your doctor, pharmacist, or health care provider.  2024 Elsevier/Gold Standard (2022-01-03 00:00:00)   Cisplatin  Injection What is this medication? CISPLATIN  (SIS pla tin) treats some types of cancer. It works by slowing down the growth of  cancer cells. This medicine may be used for other purposes; ask your health care provider or pharmacist if you have questions. COMMON BRAND NAME(S): Platinol , Platinol  -AQ What should I tell my care team before I take this medication? They need to know if you have any of these conditions: Eye disease, vision problems Hearing problems Kidney disease Low blood counts, such as low white cells, platelets, or red blood cells Tingling of the fingers or toes, or other nerve disorder An unusual or allergic reaction to  cisplatin , carboplatin, oxaliplatin, other medications, foods, dyes, or preservatives If you or your partner are pregnant or trying to get pregnant Breast-feeding How should I use this medication? This medication is injected into a vein. It is given by your care team in a hospital or clinic setting. Talk to your care team about the use of this medication in children. Special care may be needed. Overdosage: If you think you have taken too much of this medicine contact a poison control center or emergency room at once. NOTE: This medicine is only for you. Do not share this medicine with others. What if I miss a dose? Keep appointments for follow-up doses. It is important not to miss your dose. Call your care team if you are unable to keep an appointment. What may interact with this medication? Do not take this medication with any of the following: Live virus vaccines This medication may also interact with the following: Certain antibiotics, such as amikacin, gentamicin, neomycin, polymyxin B, streptomycin, tobramycin, vancomycin  Foscarnet This list may not describe all possible interactions. Give your health care provider a list of all the medicines, herbs, non-prescription drugs, or dietary supplements you use. Also tell them if you smoke, drink alcohol, or use illegal drugs. Some items may interact with your medicine. What should I watch for while using this medication? Your condition will be monitored carefully while you are receiving this medication. You may need blood work done while taking this medication. This medication may make you feel generally unwell. This is not uncommon, as chemotherapy can affect healthy cells as well as cancer cells. Report any side effects. Continue your course of treatment even though you feel ill unless your care team tells you to stop. This medication may increase your risk of getting an infection. Call your care team for advice if you get a fever, chills, sore  throat, or other symptoms of a cold or flu. Do not treat yourself. Try to avoid being around people who are sick. Avoid taking medications that contain aspirin , acetaminophen , ibuprofen, naproxen, or ketoprofen unless instructed by your care team. These medications may hide a fever. This medication may increase your risk to bruise or bleed. Call your care team if you notice any unusual bleeding. Be careful brushing or flossing your teeth or using a toothpick because you may get an infection or bleed more easily. If you have any dental work done, tell your dentist you are receiving this medication. Drink fluids as directed while you are taking this medication. This will help protect your kidneys. Call your care team if you get diarrhea. Do not treat yourself. Talk to your care team if you or your partner wish to become pregnant or think you might be pregnant. This medication can cause serious birth defects if taken during pregnancy and for 14 months after the last dose. A negative pregnancy test is required before starting this medication. A reliable form of contraception is recommended while taking this medication and for 14 months  after the last dose. Talk to your care team about effective forms of contraception. Do not father a child while taking this medication and for 11 months after the last dose. Use a condom during sex during this time period. Do not breast-feed while taking this medication. This medication may cause infertility. Talk to your care team if you are concerned about your fertility. What side effects may I notice from receiving this medication? Side effects that you should report to your care team as soon as possible: Allergic reactions--skin rash, itching, hives, swelling of the face, lips, tongue, or throat Eye pain, change in vision, vision loss Hearing loss, ringing in ears Infection--fever, chills, cough, sore throat, wounds that don't heal, pain or trouble when passing urine,  general feeling of discomfort or being unwell Kidney injury--decrease in the amount of urine, swelling of the ankles, hands, or feet Low red blood cell level--unusual weakness or fatigue, dizziness, headache, trouble breathing Painful swelling, warmth, or redness of the skin, blisters or sores at the infusion site Pain, tingling, or numbness in the hands or feet Unusual bruising or bleeding Side effects that usually do not require medical attention (report to your care team if they continue or are bothersome): Hair loss Nausea Vomiting This list may not describe all possible side effects. Call your doctor for medical advice about side effects. You may report side effects to FDA at 1-800-FDA-1088. Where should I keep my medication? This medication is given in a hospital or clinic. It will not be stored at home. NOTE: This sheet is a summary. It may not cover all possible information. If you have questions about this medicine, talk to your doctor, pharmacist, or health care provider.  2024 Elsevier/Gold Standard (2021-12-30 00:00:00)  BELOW ARE SYMPTOMS THAT SHOULD BE REPORTED IMMEDIATELY: *FEVER GREATER THAN 100.4 F (38 C) OR HIGHER *CHILLS OR SWEATING *NAUSEA AND VOMITING THAT IS NOT CONTROLLED WITH YOUR NAUSEA MEDICATION *UNUSUAL SHORTNESS OF BREATH *UNUSUAL BRUISING OR BLEEDING *URINARY PROBLEMS (pain or burning when urinating, or frequent urination) *BOWEL PROBLEMS (unusual diarrhea, constipation, pain near the anus) TENDERNESS IN MOUTH AND THROAT WITH OR WITHOUT PRESENCE OF ULCERS (sore throat, sores in mouth, or a toothache) UNUSUAL RASH, SWELLING OR PAIN  UNUSUAL VAGINAL DISCHARGE OR ITCHING   Items with * indicate a potential emergency and should be followed up as soon as possible or go to the Emergency Department if any problems should occur.  Please show the CHEMOTHERAPY ALERT CARD or IMMUNOTHERAPY ALERT CARD at check-in to the Emergency Department and triage nurse.  Should  you have questions after your visit or need to cancel or reschedule your appointment, please contact [facility] [place] - A DEPT OF [facility] [phone]  and follow the prompts.  Office hours are 8:00 a.m. to 4:30 p.m. Monday - Friday. Please note that voicemails left after 4:00 p.m. may not be returned until the following business day.  We are closed weekends and major holidays. You have access to a nurse at all times for urgent questions. Please call the main number to the clinic [phone] and follow the prompts.  For any non-urgent questions, you may also contact your provider using MyChart. We now offer e-Visits for anyone 82 and older to request care online for non-urgent symptoms. For details visit [email or web address].   Also download the MyChart app! Go to the app store, search MyChart, open the app, select [facility], and log in with your MyChart username and password.

## 2024-04-02 ENCOUNTER — Ambulatory Visit: Payer: Self-pay

## 2024-04-02 LAB — CANCER ANTIGEN 19-9: CA 19-9: 133 U/mL — ABNORMAL HIGH (ref 0–35)

## 2024-04-02 LAB — CEA: CEA: 11.1 ng/mL — ABNORMAL HIGH (ref 0.0–4.7)

## 2024-04-04 ENCOUNTER — Ambulatory Visit (HOSPITAL_COMMUNITY)
Admission: RE | Admit: 2024-04-04 | Discharge: 2024-04-04 | Disposition: A | Discharge status: Home / Self Care | Source: Ambulatory Visit | Attending: Hematology | Admitting: Hematology

## 2024-04-04 DIAGNOSIS — C787 Secondary malignant neoplasm of liver and intrahepatic bile duct: Secondary | ICD-10-CM | POA: Insufficient documentation

## 2024-04-04 DIAGNOSIS — C221 Intrahepatic bile duct carcinoma: Secondary | ICD-10-CM | POA: Diagnosis present

## 2024-04-04 MED ORDER — IOHEXOL 350 MG/ML SOLN
80.0000 mL | Freq: Once | INTRAVENOUS | Status: AC | PRN
  Administered 2024-04-04: 80 mL via INTRAVENOUS

## 2024-04-04 MED ORDER — IOHEXOL 9 MG/ML PO SOLN
ORAL | Status: AC
  Filled 2024-04-04: qty 1000

## 2024-04-07 ENCOUNTER — Encounter: Payer: Self-pay | Admitting: Hematology

## 2024-04-07 ENCOUNTER — Other Ambulatory Visit: Payer: Self-pay | Admitting: *Deleted

## 2024-04-07 DIAGNOSIS — R970 Elevated carcinoembryonic antigen [CEA]: Secondary | ICD-10-CM

## 2024-04-07 DIAGNOSIS — R180 Malignant ascites: Secondary | ICD-10-CM

## 2024-04-07 DIAGNOSIS — C787 Secondary malignant neoplasm of liver and intrahepatic bile duct: Secondary | ICD-10-CM

## 2024-04-07 NOTE — Telephone Encounter (Signed)
 Discussed CT results with Dr. Rogers.  Since patient has stated that he has had recent increase in pain  on right side of abdomen extending mid abdomen with distention, will obtain MRI liver w/wo contrast prior to follow up & treatment next week.  Patient made aware and verbalized understanding.  Will be npo 4 hours prior and arrive at 4:30 for scan.

## 2024-04-08 ENCOUNTER — Other Ambulatory Visit

## 2024-04-08 ENCOUNTER — Ambulatory Visit (HOSPITAL_COMMUNITY)
Admission: RE | Admit: 2024-04-08 | Discharge: 2024-04-08 | Disposition: A | Discharge status: Home / Self Care | Source: Ambulatory Visit | Attending: Hematology | Admitting: Hematology

## 2024-04-08 ENCOUNTER — Ambulatory Visit: Admitting: Hematology

## 2024-04-08 ENCOUNTER — Ambulatory Visit

## 2024-04-08 DIAGNOSIS — R180 Malignant ascites: Secondary | ICD-10-CM | POA: Diagnosis present

## 2024-04-08 DIAGNOSIS — R970 Elevated carcinoembryonic antigen [CEA]: Secondary | ICD-10-CM | POA: Insufficient documentation

## 2024-04-08 DIAGNOSIS — C221 Intrahepatic bile duct carcinoma: Secondary | ICD-10-CM | POA: Insufficient documentation

## 2024-04-08 DIAGNOSIS — C787 Secondary malignant neoplasm of liver and intrahepatic bile duct: Secondary | ICD-10-CM | POA: Insufficient documentation

## 2024-04-08 MED ORDER — GADOBUTROL 1 MMOL/ML IV SOLN
10.0000 mL | Freq: Once | INTRAVENOUS | Status: AC | PRN
  Administered 2024-04-08: 10 mL via INTRAVENOUS

## 2024-04-09 ENCOUNTER — Ambulatory Visit (HOSPITAL_COMMUNITY)

## 2024-04-15 ENCOUNTER — Inpatient Hospital Stay: Attending: Hematology

## 2024-04-15 ENCOUNTER — Inpatient Hospital Stay (HOSPITAL_BASED_OUTPATIENT_CLINIC_OR_DEPARTMENT_OTHER): Admitting: Hematology

## 2024-04-15 ENCOUNTER — Inpatient Hospital Stay

## 2024-04-15 VITALS — BP 126/79 | HR 94 | Temp 97.9°F | Resp 18

## 2024-04-15 VITALS — BP 138/83 | HR 102 | Temp 97.9°F | Resp 20 | Wt 299.6 lb

## 2024-04-15 DIAGNOSIS — M545 Low back pain, unspecified: Secondary | ICD-10-CM | POA: Insufficient documentation

## 2024-04-15 DIAGNOSIS — Z79899 Other long term (current) drug therapy: Secondary | ICD-10-CM | POA: Diagnosis not present

## 2024-04-15 DIAGNOSIS — G479 Sleep disorder, unspecified: Secondary | ICD-10-CM | POA: Diagnosis not present

## 2024-04-15 DIAGNOSIS — Z87891 Personal history of nicotine dependence: Secondary | ICD-10-CM | POA: Insufficient documentation

## 2024-04-15 DIAGNOSIS — Z8551 Personal history of malignant neoplasm of bladder: Secondary | ICD-10-CM | POA: Insufficient documentation

## 2024-04-15 DIAGNOSIS — R180 Malignant ascites: Secondary | ICD-10-CM | POA: Insufficient documentation

## 2024-04-15 DIAGNOSIS — R059 Cough, unspecified: Secondary | ICD-10-CM | POA: Diagnosis not present

## 2024-04-15 DIAGNOSIS — Z5112 Encounter for antineoplastic immunotherapy: Secondary | ICD-10-CM | POA: Diagnosis present

## 2024-04-15 DIAGNOSIS — C221 Intrahepatic bile duct carcinoma: Secondary | ICD-10-CM

## 2024-04-15 DIAGNOSIS — Z95828 Presence of other vascular implants and grafts: Secondary | ICD-10-CM

## 2024-04-15 DIAGNOSIS — R970 Elevated carcinoembryonic antigen [CEA]: Secondary | ICD-10-CM | POA: Insufficient documentation

## 2024-04-15 DIAGNOSIS — Z5111 Encounter for antineoplastic chemotherapy: Secondary | ICD-10-CM | POA: Diagnosis present

## 2024-04-15 DIAGNOSIS — G629 Polyneuropathy, unspecified: Secondary | ICD-10-CM | POA: Insufficient documentation

## 2024-04-15 DIAGNOSIS — C786 Secondary malignant neoplasm of retroperitoneum and peritoneum: Secondary | ICD-10-CM

## 2024-04-15 DIAGNOSIS — C784 Secondary malignant neoplasm of small intestine: Secondary | ICD-10-CM

## 2024-04-15 LAB — CBC WITH DIFFERENTIAL/PLATELET
Abs Immature Granulocytes: 0.06 K/uL (ref 0.00–0.07)
Basophils Absolute: 0 K/uL (ref 0.0–0.1)
Basophils Relative: 1 %
Eosinophils Absolute: 0.2 K/uL (ref 0.0–0.5)
Eosinophils Relative: 3 %
HCT: 33.7 % — ABNORMAL LOW (ref 39.0–52.0)
Hemoglobin: 10.3 g/dL — ABNORMAL LOW (ref 13.0–17.0)
Immature Granulocytes: 1 %
Lymphocytes Relative: 13 %
Lymphs Abs: 0.8 K/uL (ref 0.7–4.0)
MCH: 27.7 pg (ref 26.0–34.0)
MCHC: 30.6 g/dL (ref 30.0–36.0)
MCV: 90.6 fL (ref 80.0–100.0)
Monocytes Absolute: 1.1 K/uL — ABNORMAL HIGH (ref 0.1–1.0)
Monocytes Relative: 17 %
Neutro Abs: 4.3 K/uL (ref 1.7–7.7)
Neutrophils Relative %: 65 %
Platelets: 296 K/uL (ref 150–400)
RBC: 3.72 MIL/uL — ABNORMAL LOW (ref 4.22–5.81)
RDW: 16.1 % — ABNORMAL HIGH (ref 11.5–15.5)
WBC: 6.5 K/uL (ref 4.0–10.5)
nRBC: 0.3 % — ABNORMAL HIGH (ref 0.0–0.2)

## 2024-04-15 LAB — COMPREHENSIVE METABOLIC PANEL WITH GFR
ALT: 20 U/L (ref 0–44)
AST: 21 U/L (ref 15–41)
Albumin: 3.2 g/dL — ABNORMAL LOW (ref 3.5–5.0)
Alkaline Phosphatase: 83 U/L (ref 38–126)
Anion gap: 10 (ref 5–15)
BUN: 26 mg/dL — ABNORMAL HIGH (ref 8–23)
CO2: 26 mmol/L (ref 22–32)
Calcium: 9 mg/dL (ref 8.9–10.3)
Chloride: 101 mmol/L (ref 98–111)
Creatinine, Ser: 0.96 mg/dL (ref 0.61–1.24)
GFR, Estimated: >60 mL/min (ref >60)
Glucose, Bld: 174 mg/dL — ABNORMAL HIGH (ref 70–99)
Potassium: 4 mmol/L (ref 3.5–5.1)
Sodium: 137 mmol/L (ref 135–145)
Total Bilirubin: 0.6 mg/dL (ref 0.0–1.2)
Total Protein: 6.2 g/dL — ABNORMAL LOW (ref 6.5–8.1)

## 2024-04-15 LAB — MAGNESIUM: Magnesium: 1.8 mg/dL (ref 1.7–2.4)

## 2024-04-15 MED ORDER — DEXAMETHASONE SODIUM PHOSPHATE 10 MG/ML IJ SOLN
10.0000 mg | Freq: Once | INTRAMUSCULAR | Status: AC
  Administered 2024-04-15: 10 mg via INTRAVENOUS
  Filled 2024-04-15: qty 1

## 2024-04-15 MED ORDER — SODIUM CHLORIDE 0.9 % IV SOLN
INTRAVENOUS | Status: DC
Start: 2024-04-15 — End: 2024-04-15

## 2024-04-15 MED ORDER — SODIUM CHLORIDE 0.9% FLUSH
10.0000 mL | INTRAVENOUS | Status: DC | PRN
  Administered 2024-04-15: 10 mL via INTRAVENOUS

## 2024-04-15 MED ORDER — MAGNESIUM SULFATE 2 GM/50ML IV SOLN
2.0000 g | Freq: Once | INTRAVENOUS | Status: AC
  Administered 2024-04-15: 2 g via INTRAVENOUS
  Filled 2024-04-15: qty 50

## 2024-04-15 MED ORDER — PROCHLORPERAZINE MALEATE 10 MG PO TABS
10.0000 mg | ORAL_TABLET | Freq: Once | ORAL | Status: AC
  Administered 2024-04-15: 10 mg via ORAL
  Filled 2024-04-15: qty 1

## 2024-04-15 MED ORDER — SODIUM CHLORIDE 0.9 % IV SOLN
800.0000 mg/m2 | Freq: Once | INTRAVENOUS | Status: AC
  Administered 2024-04-15: 2052 mg via INTRAVENOUS
  Filled 2024-04-15: qty 53.97

## 2024-04-15 MED ORDER — SODIUM CHLORIDE 0.9 % IV SOLN: Freq: Once | INTRAVENOUS | Status: AC

## 2024-04-15 MED ORDER — BENZONATATE 200 MG PO CAPS: 200.0000 mg | ORAL_CAPSULE | Freq: Three times a day (TID) | ORAL | 0 refills | Status: DC | PRN

## 2024-04-15 MED ORDER — SODIUM CHLORIDE 0.9 % IV SOLN
150.0000 mg | Freq: Once | INTRAVENOUS | Status: AC
  Administered 2024-04-15: 150 mg via INTRAVENOUS
  Filled 2024-04-15: qty 150

## 2024-04-15 MED ORDER — PALONOSETRON HCL INJECTION 0.25 MG/5ML
0.2500 mg | Freq: Once | INTRAVENOUS | Status: AC
  Administered 2024-04-15: 0.25 mg via INTRAVENOUS
  Filled 2024-04-15: qty 5

## 2024-04-15 MED ORDER — POTASSIUM CHLORIDE IN NACL 20-0.9 MEQ/L-% IV SOLN
Freq: Once | INTRAVENOUS | Status: AC
  Filled 2024-04-15: qty 1000

## 2024-04-15 MED ORDER — SODIUM CHLORIDE 0.9 % IV SOLN
1500.0000 mg | Freq: Once | INTRAVENOUS | Status: AC
  Administered 2024-04-15: 1500 mg via INTRAVENOUS
  Filled 2024-04-15: qty 30

## 2024-04-15 MED ORDER — SODIUM CHLORIDE 0.9 % IV SOLN
25.0000 mg/m2 | Freq: Once | INTRAVENOUS | Status: AC
  Administered 2024-04-15: 65 mg via INTRAVENOUS
  Filled 2024-04-15: qty 65

## 2024-04-15 NOTE — Progress Notes (Signed)
 Patients port flushed without difficulty.  Good blood return noted with no bruising or swelling noted at site.  Patient remains accessed for treatment.

## 2024-04-15 NOTE — Progress Notes (Signed)
 OK to proceed with HR 102  V.O. Dr Theadore Molt, PharmD

## 2024-04-15 NOTE — Progress Notes (Signed)
 Patient presents today for chemotherapy infusion. Patient is in satisfactory condition with no new complaints voiced.  Vital signs are stable.  Labs reviewed by Dr. Rogers during the office visit and all labs are within treatment parameters.  We will proceed with treatment per MD orders.   Patient had urine output of at 0940.  1409-Patient c/o nausea. Per patient this has happened in the past and we have given him a pill. No vomiting or other complaints/symptoms. Patient had Aloxi  as pre-med. Dr Katragadda made aware, order given for compazine  10mg  PO.    1428-374mL urine output  1518- Patient reports improvement in nausea.    1521- urine output  Patient tolerated treatment well with no complaints voiced.  Patient left ambulatory in stable condition.  Vital signs stable at discharge.  Follow up as scheduled.

## 2024-04-15 NOTE — Patient Instructions (Signed)
 [facility] - A DEPT OF [facility]  Discharge Instructions: Thank you for choosing [facility] to provide your oncology and hematology care.  If you have a lab appointment with the Cancer Center - please note that after April 8th, 2024, all labs will be drawn in the cancer center.  You do not have to check in or register with the main entrance as you have in the past but will complete your check-in in the cancer center.  Wear comfortable clothing and clothing appropriate for easy access to any Portacath or PICC line.   We strive to give you quality time with your provider. You may need to reschedule your appointment if you arrive late (15 or more minutes).  Arriving late affects you and other patients whose appointments are after yours.  Also, if you miss three or more appointments without notifying the office, you may be dismissed from the clinic at the provider's discretion.      For prescription refill requests, have your pharmacy contact our office and allow 72 hours for refills to be completed.    Today you received the following chemotherapy and/or immunotherapy agents Imfinzi , Gemzar , and Cisplatin .  Durvalumab  Injection What is this medication? DURVALUMAB  (dur VAL ue mab) treats some types of cancer. It works by helping your immune system slow or stop the spread of cancer cells. It is a monoclonal antibody. This medicine may be used for other purposes; ask your health care provider or pharmacist if you have questions. COMMON BRAND NAME(S): IMFINZI  What should I tell my care team before I take this medication? They need to know if you have any of these conditions: Allogeneic stem cell transplant (uses someone else's stem cells) Autoimmune diseases, such as Crohn disease, ulcerative colitis, lupus History of chest radiation Nervous system problems, such as Guillain-Barre syndrome, myasthenia gravis Organ transplant An unusual or allergic  reaction to durvalumab , other medications, foods, dyes, or preservatives Pregnant or trying to get pregnant Breast-feeding How should I use this medication? This medication is infused into a vein. It is given by your care team in a hospital or clinic setting. A special MedGuide will be given to you before each treatment. Be sure to read this information carefully each time. Talk to your care team about the use of this medication in children. Special care may be needed. Overdosage: If you think you have taken too much of this medicine contact a poison control center or emergency room at once. NOTE: This medicine is only for you. Do not share this medicine with others. What if I miss a dose? Keep appointments for follow-up doses. It is important not to miss your dose. Call your care team if you are unable to keep an appointment. What may interact with this medication? Interactions have not been studied. This list may not describe all possible interactions. Give your health care provider a list of all the medicines, herbs, non-prescription drugs, or dietary supplements you use. Also tell them if you smoke, drink alcohol, or use illegal drugs. Some items may interact with your medicine. What should I watch for while using this medication? Your condition will be monitored carefully while you are receiving this medication. You may need blood work while taking this medication. This medication may cause serious skin reactions. They can happen weeks to months after starting the medication. Contact your care team right away if you notice fevers or flu-like symptoms with a rash. The rash may  be red or purple and then turn into blisters or peeling of the skin. You may also notice a red rash with swelling of the face, lips, or lymph nodes in your neck or under your arms. Tell your care team right away if you have any change in your eyesight. Talk to your care team if you may be pregnant. Serious birth defects  can occur if you take this medication during pregnancy and for 3 months after the last dose. You will need a negative pregnancy test before starting this medication. Contraception is recommended while taking this medication and for 3 months after the last dose. Your care team can help you find the option that works for you. Do not breastfeed while taking this medication and for 3 months after the last dose. What side effects may I notice from receiving this medication? Side effects that you should report to your care team as soon as possible: Allergic reactions--skin rash, itching, hives, swelling of the face, lips, tongue, or throat Dry cough, shortness of breath or trouble breathing Eye pain, redness, irritation, or discharge with blurry or decreased vision Heart muscle inflammation--unusual weakness or fatigue, shortness of breath, chest pain, fast or irregular heartbeat, dizziness, swelling of the ankles, feet, or hands Hormone gland problems--headache, sensitivity to light, unusual weakness or fatigue, dizziness, fast or irregular heartbeat, increased sensitivity to cold or heat, excessive sweating, constipation, hair loss, increased thirst or amount of urine, tremors or shaking, irritability Infusion reactions--chest pain, shortness of breath or trouble breathing, feeling faint or lightheaded Kidney injury (glomerulonephritis)--decrease in the amount of urine, red or dark brown urine, foamy or bubbly urine, swelling of the ankles, hands, or feet Liver injury--right upper belly pain, loss of appetite, nausea, light-colored stool, dark yellow or brown urine, yellowing skin or eyes, unusual weakness or fatigue Pain, tingling, or numbness in the hands or feet, muscle weakness, change in vision, confusion or trouble speaking, loss of balance or coordination, trouble walking, seizures Rash, fever, and swollen lymph nodes Redness, blistering, peeling, or loosening of the skin, including inside the  mouth Sudden or severe stomach pain, bloody diarrhea, fever, nausea, vomiting Side effects that usually do not require medical attention (report these to your care team if they continue or are bothersome): Bone, joint, or muscle pain Diarrhea Fatigue Loss of appetite Nausea Skin rash This list may not describe all possible side effects. Call your doctor for medical advice about side effects. You may report side effects to FDA at 1-800-FDA-1088. Where should I keep my medication? This medication is given in a hospital or clinic. It will not be stored at home. NOTE: This sheet is a summary. It may not cover all possible information. If you have questions about this medicine, talk to your doctor, pharmacist, or health care provider.  2024 Elsevier/Gold Standard (2022-01-10 00:00:00)  Gemcitabine  Injection What is this medication? GEMCITABINE  (jem SYE ta been) treats some types of cancer. It works by slowing down the growth of cancer cells. This medicine may be used for other purposes; ask your health care provider or pharmacist if you have questions. COMMON BRAND NAME(S): Gemzar , Infugem  What should I tell my care team before I take this medication? They need to know if you have any of these conditions: Blood disorders Infection Kidney disease Liver disease Lung or breathing disease, such as asthma or COPD Recent or ongoing radiation therapy An unusual or allergic reaction to gemcitabine , other medications, foods, dyes, or preservatives If you or your partner are pregnant  or trying to get pregnant Breast-feeding How should I use this medication? This medication is injected into a vein. It is given by your care team in a hospital or clinic setting. Talk to your care team about the use of this medication in children. Special care may be needed. Overdosage: If you think you have taken too much of this medicine contact a poison control center or emergency room at once. NOTE: This  medicine is only for you. Do not share this medicine with others. What if I miss a dose? Keep appointments for follow-up doses. It is important not to miss your dose. Call your care team if you are unable to keep an appointment. What may interact with this medication? Interactions have not been studied. This list may not describe all possible interactions. Give your health care provider a list of all the medicines, herbs, non-prescription drugs, or dietary supplements you use. Also tell them if you smoke, drink alcohol, or use illegal drugs. Some items may interact with your medicine. What should I watch for while using this medication? Your condition will be monitored carefully while you are receiving this medication. This medication may make you feel generally unwell. This is not uncommon, as chemotherapy can affect healthy cells as well as cancer cells. Report any side effects. Continue your course of treatment even though you feel ill unless your care team tells you to stop. In some cases, you may be given additional medications to help with side effects. Follow all directions for their use. This medication may increase your risk of getting an infection. Call your care team for advice if you get a fever, chills, sore throat, or other symptoms of a cold or flu. Do not treat yourself. Try to avoid being around people who are sick. This medication may increase your risk to bruise or bleed. Call your care team if you notice any unusual bleeding. Be careful brushing or flossing your teeth or using a toothpick because you may get an infection or bleed more easily. If you have any dental work done, tell your dentist you are receiving this medication. Avoid taking medications that contain aspirin , acetaminophen , ibuprofen, naproxen, or ketoprofen unless instructed by your care team. These medications may hide a fever. Talk to your care team if you or your partner wish to become pregnant or think you might  be pregnant. This medication can cause serious birth defects if taken during pregnancy and for 6 months after the last dose. A negative pregnancy test is required before starting this medication. A reliable form of contraception is recommended while taking this medication and for 6 months after the last dose. Talk to your care team about effective forms of contraception. Do not father a child while taking this medication and for 3 months after the last dose. Use a condom while having sex during this time period. Do not breastfeed while taking this medication and for at least 1 week after the last dose. This medication may cause infertility. Talk to your care team if you are concerned about your fertility. What side effects may I notice from receiving this medication? Side effects that you should report to your care team as soon as possible: Allergic reactions--skin rash, itching, hives, swelling of the face, lips, tongue, or throat Capillary leak syndrome--stomach or muscle pain, unusual weakness or fatigue, feeling faint or lightheaded, decrease in the amount of urine, swelling of the ankles, hands, or feet, trouble breathing Infection--fever, chills, cough, sore throat, wounds that don't heal,  pain or trouble when passing urine, general feeling of discomfort or being unwell Liver injury--right upper belly pain, loss of appetite, nausea, light-colored stool, dark yellow or brown urine, yellowing skin or eyes, unusual weakness or fatigue Low red blood cell level--unusual weakness or fatigue, dizziness, headache, trouble breathing Lung injury--shortness of breath or trouble breathing, cough, spitting up blood, chest pain, fever Stomach pain, bloody diarrhea, pale skin, unusual weakness or fatigue, decrease in the amount of urine, which may be signs of hemolytic uremic syndrome Sudden and severe headache, confusion, change in vision, seizures, which may be signs of posterior reversible encephalopathy  syndrome (PRES) Unusual bruising or bleeding Side effects that usually do not require medical attention (report to your care team if they continue or are bothersome): Diarrhea Drowsiness Hair loss Nausea Pain, redness, or swelling with sores inside the mouth or throat Vomiting This list may not describe all possible side effects. Call your doctor for medical advice about side effects. You may report side effects to FDA at 1-800-FDA-1088. Where should I keep my medication? This medication is given in a hospital or clinic. It will not be stored at home. NOTE: This sheet is a summary. It may not cover all possible information. If you have questions about this medicine, talk to your doctor, pharmacist, or health care provider.  2024 Elsevier/Gold Standard (2022-01-03 00:00:00)  Cisplatin  Injection What is this medication? CISPLATIN  (SIS pla tin) treats some types of cancer. It works by slowing down the growth of cancer cells. This medicine may be used for other purposes; ask your health care provider or pharmacist if you have questions. COMMON BRAND NAME(S): Platinol , Platinol  -AQ What should I tell my care team before I take this medication? They need to know if you have any of these conditions: Eye disease, vision problems Hearing problems Kidney disease Low blood counts, such as low white cells, platelets, or red blood cells Tingling of the fingers or toes, or other nerve disorder An unusual or allergic reaction to cisplatin , carboplatin, oxaliplatin, other medications, foods, dyes, or preservatives If you or your partner are pregnant or trying to get pregnant Breast-feeding How should I use this medication? This medication is injected into a vein. It is given by your care team in a hospital or clinic setting. Talk to your care team about the use of this medication in children. Special care may be needed. Overdosage: If you think you have taken too much of this medicine contact a  poison control center or emergency room at once. NOTE: This medicine is only for you. Do not share this medicine with others. What if I miss a dose? Keep appointments for follow-up doses. It is important not to miss your dose. Call your care team if you are unable to keep an appointment. What may interact with this medication? Do not take this medication with any of the following: Live virus vaccines This medication may also interact with the following: Certain antibiotics, such as amikacin, gentamicin, neomycin, polymyxin B, streptomycin, tobramycin, vancomycin  Foscarnet This list may not describe all possible interactions. Give your health care provider a list of all the medicines, herbs, non-prescription drugs, or dietary supplements you use. Also tell them if you smoke, drink alcohol, or use illegal drugs. Some items may interact with your medicine. What should I watch for while using this medication? Your condition will be monitored carefully while you are receiving this medication. You may need blood work done while taking this medication. This medication may make you feel  generally unwell. This is not uncommon, as chemotherapy can affect healthy cells as well as cancer cells. Report any side effects. Continue your course of treatment even though you feel ill unless your care team tells you to stop. This medication may increase your risk of getting an infection. Call your care team for advice if you get a fever, chills, sore throat, or other symptoms of a cold or flu. Do not treat yourself. Try to avoid being around people who are sick. Avoid taking medications that contain aspirin , acetaminophen , ibuprofen, naproxen, or ketoprofen unless instructed by your care team. These medications may hide a fever. This medication may increase your risk to bruise or bleed. Call your care team if you notice any unusual bleeding. Be careful brushing or flossing your teeth or using a toothpick because you  may get an infection or bleed more easily. If you have any dental work done, tell your dentist you are receiving this medication. Drink fluids as directed while you are taking this medication. This will help protect your kidneys. Call your care team if you get diarrhea. Do not treat yourself. Talk to your care team if you or your partner wish to become pregnant or think you might be pregnant. This medication can cause serious birth defects if taken during pregnancy and for 14 months after the last dose. A negative pregnancy test is required before starting this medication. A reliable form of contraception is recommended while taking this medication and for 14 months after the last dose. Talk to your care team about effective forms of contraception. Do not father a child while taking this medication and for 11 months after the last dose. Use a condom during sex during this time period. Do not breast-feed while taking this medication. This medication may cause infertility. Talk to your care team if you are concerned about your fertility. What side effects may I notice from receiving this medication? Side effects that you should report to your care team as soon as possible: Allergic reactions--skin rash, itching, hives, swelling of the face, lips, tongue, or throat Eye pain, change in vision, vision loss Hearing loss, ringing in ears Infection--fever, chills, cough, sore throat, wounds that don't heal, pain or trouble when passing urine, general feeling of discomfort or being unwell Kidney injury--decrease in the amount of urine, swelling of the ankles, hands, or feet Low red blood cell level--unusual weakness or fatigue, dizziness, headache, trouble breathing Painful swelling, warmth, or redness of the skin, blisters or sores at the infusion site Pain, tingling, or numbness in the hands or feet Unusual bruising or bleeding Side effects that usually do not require medical attention (report to your care  team if they continue or are bothersome): Hair loss Nausea Vomiting This list may not describe all possible side effects. Call your doctor for medical advice about side effects. You may report side effects to FDA at 1-800-FDA-1088. Where should I keep my medication? This medication is given in a hospital or clinic. It will not be stored at home. NOTE: This sheet is a summary. It may not cover all possible information. If you have questions about this medicine, talk to your doctor, pharmacist, or health care provider.  2024 Elsevier/Gold Standard (2021-12-30 00:00:00)   To help prevent nausea and vomiting after your treatment, we encourage you to take your nausea medication as directed.  BELOW ARE SYMPTOMS THAT SHOULD BE REPORTED IMMEDIATELY: *FEVER GREATER THAN 100.4 F (38 C) OR HIGHER *CHILLS OR SWEATING *NAUSEA AND VOMITING THAT  IS NOT CONTROLLED WITH YOUR NAUSEA MEDICATION *UNUSUAL SHORTNESS OF BREATH *UNUSUAL BRUISING OR BLEEDING *URINARY PROBLEMS (pain or burning when urinating, or frequent urination) *BOWEL PROBLEMS (unusual diarrhea, constipation, pain near the anus) TENDERNESS IN MOUTH AND THROAT WITH OR WITHOUT PRESENCE OF ULCERS (sore throat, sores in mouth, or a toothache) UNUSUAL RASH, SWELLING OR PAIN  UNUSUAL VAGINAL DISCHARGE OR ITCHING   Items with * indicate a potential emergency and should be followed up as soon as possible or go to the Emergency Department if any problems should occur.  Please show the CHEMOTHERAPY ALERT CARD or IMMUNOTHERAPY ALERT CARD at check-in to the Emergency Department and triage nurse.  Should you have questions after your visit or need to cancel or reschedule your appointment, please contact [facility] - A DEPT OF [facility] [phone]  and follow the prompts.  Office hours are 8:00 a.m. to 4:30 p.m. Monday - Friday. Please note that voicemails left after 4:00 p.m. may not be returned until the following  business day.  We are closed weekends and major holidays. You have access to a nurse at all times for urgent questions. Please call the main number to the clinic [phone] and follow the prompts.  For any non-urgent questions, you may also contact your provider using MyChart. We now offer e-Visits for anyone 59 and older to request care online for non-urgent symptoms. For details visit [email or web address].   Also download the MyChart app! Go to the app store, search MyChart, open the app, select [facility], and log in with your MyChart username and password.

## 2024-04-15 NOTE — Progress Notes (Unsigned)
Patient has been assessed, vital signs and labs have been reviewed by Dr. Katragadda. ANC, Creatinine, LFTs, and Platelets are within treatment parameters per Dr. Katragadda. The patient is good to proceed with treatment at this time. Primary RN and pharmacy aware.  

## 2024-04-15 NOTE — Patient Instructions (Addendum)
 [facility] - [facility]  Discharge Instructions  You were seen and examined today by Dr. Rogers.  Dr. 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 All of your labs look normal except for your sugar being 174 and the tumor marker has not resulted yet.   Dr. Katragadda recommends continuing on the same treatment for now since your treatment was delayed due to your surgery in May. Dr. Rogers discussed that if the spots continue to get bigger on your next scan we will change treatment at that time.  Follow-up as scheduled.    Thank you for choosing [facility] to provide your oncology and hematology care.   To afford each patient quality time with our provider, please arrive at least 15 minutes before your scheduled appointment time. You may need to reschedule your appointment if you arrive late (10 or more minutes). Arriving late affects you and other patients whose appointments are after yours.  Also, if you miss three or more appointments without notifying the office, you may be dismissed from the clinic at the provider's discretion.    Again, thank you for choosing [facility].  Our hope is that these requests will decrease the amount of time that you wait before being seen by our physicians.   If you have a lab appointment with the Cancer Center - please note that after April 8th, all labs will be drawn in the cancer center.  You do not have to check in or register with the main entrance as you have in the past but will complete your check-in at the cancer center.            _____________________________________________________________  Should you have questions after your visit to [facility], please contact our office at [phone] and follow the prompts.  Our office hours are 8:00 a.m. to 4:30 p.m. Monday -  Thursday and 8:00 a.m. to 2:30 p.m. Friday.  Please note that voicemails left after 4:00 p.m. may not be returned until the following business day.  We are closed weekends and all major holidays.  You do have access to a nurse 24-7, just call the main number to the clinic [phone] and do not press any options, hold on the line and a nurse will answer the phone.    For prescription refill requests, have your pharmacy contact our office and allow 72 hours.    Masks are no longer required in the cancer centers. If you would like for your care team to wear a mask while they are taking care of you, please let them know. You may have one support person who is at least 69 years old accompany you for your appointments.

## 2024-04-15 NOTE — Progress Notes (Signed)
 [facility] 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 Darlyn Shona - Reported pressure on the sides of the abdomen with slight pain for the last 1 month.  He also reported pain in the epigastric region. - He had lost 20 pounds in the last 3 to 4 months intentionally, cutting back on sugars.  He was also started on semaglutide . - Colonoscopy on 06/28/2011 with a benign polypoid colonic mucosa in the descending colon.  No evidence of malignancy. - MRI of the brain was negative. - EGD and colonoscopy on 09/23/2018 did not reveal any malignancies. 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  IHC positive for CK7, CDX2 and negative for GATA3.  Findings suggestive of upper GI or pancreaticobiliary primary. - Cycle 1 of gemcitabine , cisplatin  and durvalumab  started on 10/05/2021. - NGS: No targetable mutations.  PD-L1 (SP142) negative.  MSI-stable.  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   2. Social/family history: - He lives at home by himself.  He records voiceover/narrations. - Non-smoker. - Father died of MDS.  Paternal grandfather had prostate cancer.  Maternal grandfather had cancer.  3.  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 Renda.  Reportedly received 1 treatment of intravesical chemo and has been on surveillance since then.  Last  surveillance visit was in 2020.    Plan: 1. Cholangiocarcinoma with peritoneal carcinomatosis: - He received cycle 1 on 12/07/2023 followed by hip surgery.  He started cycle 2 on 03/10/2024 and cycle 3 on 03/18/2024. - She reported dry cough.  No evidence of pneumonitis on the CT scan.  He reported more fatigue in the last 4 weeks compared to previous treatments. - Reviewed CT CAP from 04/04/2024: Peritoneal pleural fluid and nodularity appears slightly improved.  There is subtle hypodensity in the right hepatic lobe. - MRI of the liver on 04/08/2024: Multiple small metastatic lesions involving both lobes of the liver. - Despite the liver lesions more visible on MRI, they have been likely present all along.  I have recommended continuing same regimen for 2-3 more cycles and repeating scan.  If there is any clinical evidence of progression, may repeat scan after 2 cycles.  Proceed with cycle 4-day 1 today.  RTC 4 weeks for follow-up.  2. Fluid retention: - Continue Bumex  2 mg daily as needed.  Metolazone  and spironolactone  on hold.  3.  Neuropathy/drawing of legs: - Continue Mirapex  3 times daily and gabapentin  300 mg 4 times daily.  4.  Sleeping difficulty: - Continue Lunesta at bedtime as needed.  5.  Hypomagnesemia: - Continue magnesium  twice daily.  Magnesium  is normal.   6.  Right-sided lower back pain: - MRI of the lumbar spine and pelvis on 05/30/2023 showed DJD and no evidence of malignancy.    No orders of the defined types were placed in this encounter.     LILLETTE Verneta SAUNDERS Teague,acting as a Neurosurgeon for Hai Rogers, MD.,have documented all relevant documentation on  the behalf of Alean Stands, MD,as directed by  Alean Stands, MD while in the presence of Alean Stands, MD.  I, Alean Stands MD, have reviewed the above documentation for accuracy and completeness, and I agree with the above.      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 male presenting to clinic today for follow up of cholangiocarcinoma and peritoneal carcinomatosis. He was last seen by me on 03/18/2024.  Since his last visit, he underwent CT CAP on 04/04/2024 that found: No evidence of thoracic metastasis. Subtle hypodensity in the RIGHT hepatic lobe is indeterminate. Peritoneal pleural fluid and nodularity appears slightly improved. Small amount of fluid along the margin of the liver is slightly increased. Cholelithiasis without evidence cholecystitis.  Samaad then had MRI liver on 04/08/2024 which showed: Multiple small metastatic lesions involving both lobes of the liver. Omental and peritoneal surface disease throughout the upper abdomen, better seen on the CT scan. Small volume ascites.  Today, he states that he is doing well overall. His appetite level is at 50%. His energy level is at 0%. His abdominal discomfort is stable,  noticeable only on palpation and when focusing on symptoms. LUQ pain with certain movements is stable.   His fatigue has worsened over the past month and notes fatigue persisted throughout the duration of his last 2 treatments.   He reports a worsening dry, nonproductive cough fits over the past few weeks. His cough now occur several times during the day and night. When symptoms first began, he noticed a rattling cough that became a dry cough shortly after.  Favio denies any nasal discharge or mucus in the throat. He also denies any history of seasonal allergies. He is taking Zyrtec and Mucinex prn proactively for his cough. He is not taking any OTC cough medications. Ceejay has taken tessalon  in the past, though he is not sure if it was effective in treating symptoms at that time.   His right sided lower back pain is resolved, which he attributes to a musculoskeletal injury from sleeping in an awkward position.  PAST MEDICAL HISTORY:   Past Medical History: Past Medical History:  Diagnosis Date   Anxiety    Arthritis    Bladder cancer (HCC) 09/11/2009   CAD (coronary artery disease), native coronary artery    a. Mildly elevated troponin 03/2013, cath with nonobstructive disease including 50% AV groove distal stenosis before large OM  Depression    Essential hypertension    Headache(784.0)    History of migraines   Hyperglycemia    Mixed hyperlipidemia    Neuropathy    Obesity    Port-A-Cath in place 09/30/2021   Pre-diabetes    Seasonal allergies    Sleep apnea    On CPAP    Surgical History: Past Surgical History:  Procedure Laterality Date   BIOPSY  09/23/2021   Procedure: BIOPSY;  Surgeon: Eartha Angelia Sieving, MD;  Location: [facility];  Service: Gastroenterology;;   COLONOSCOPY  06/28/2011   Procedure: COLONOSCOPY;  Surgeon: Claudis RAYMOND Rivet, MD;  Location: [facility];  Service: Endoscopy;  Laterality: N/A;   COLONOSCOPY WITH PROPOFOL  N/A 09/23/2021    Procedure: COLONOSCOPY WITH PROPOFOL ;  Surgeon: Eartha Angelia Sieving, MD;  Location: [facility];  Service: Gastroenterology;  Laterality: N/A;  940   ESOPHAGOGASTRODUODENOSCOPY (EGD) WITH PROPOFOL  N/A 09/23/2021   Procedure: ESOPHAGOGASTRODUODENOSCOPY (EGD) WITH PROPOFOL ;  Surgeon: Eartha Angelia Sieving, MD;  Location: [facility];  Service: Gastroenterology;  Laterality: N/A;   IR IMAGING GUIDED PORT INSERTION  09/28/2021   IR PARACENTESIS  09/28/2021   JOINT REPLACEMENT Right    hip   LEFT HEART CATHETERIZATION WITH CORONARY ANGIOGRAM N/A 03/31/2013   Procedure: LEFT HEART CATHETERIZATION WITH CORONARY ANGIOGRAM;  Surgeon: Lynwood Schilling, MD;  Location: [facility] CATH LAB;  Service: Cardiovascular;  Laterality: N/A;   POLYPECTOMY  09/23/2021   Procedure: POLYPECTOMY;  Surgeon: Eartha Angelia Sieving, MD;  Location: [facility];  Service: Gastroenterology;;   TOTAL HIP ARTHROPLASTY Left 01/15/2024   Procedure: ARTHROPLASTY, HIP, TOTAL, ANTERIOR APPROACH;  Surgeon: Ernie Cough, MD;  Location: [facility];  Service: Orthopedics;  Laterality: Left;   TURBT  09/11/2009    Social History: Social History   Socioeconomic History   Marital status: Divorced    Spouse name: Not on file   Number of children: 0   Years of education: college   Highest education level: Not on file  Occupational History    Employer: SELF-EMPLOYED  Tobacco Use   Smoking status: Former    Current packs/day: 0.00    Average packs/day: 0.5 packs/day for 10.0 years (5.0 ttl pk-yrs)    Types: Cigarettes    Start date: 07/09/2001    Quit date: 07/10/2011    Years since quitting: 12.7   Smokeless tobacco: Never   Tobacco comments:    Quit several yrs prior to 03/2013.  Vaping Use   Vaping status: Never Used  Substance and Sexual Activity   Alcohol use: Yes    Alcohol/week: 0.0 standard drinks of alcohol    Comment: Occasional   Drug use: No   Sexual activity: Not on file  Other Topics Concern   Not on file   Social History Narrative   Not on file   Social Drivers of Health   Financial Resource Strain: Not on file  Food Insecurity: No Food Insecurity (01/15/2024)   Hunger Vital Sign    Worried About Running Out of Food in the Last Year: Never true    Ran Out of Food in the Last Year: Never true  Transportation Needs: No Transportation Needs (01/15/2024)   PRAPARE - Administrator, Civil Service (Medical): No    Lack of Transportation (Non-Medical): No  Physical Activity: Not on file  Stress: Not on file  Social Connections: Moderately Isolated (01/15/2024)   Social Connection and Isolation Panel    Frequency of Communication with Friends and  Family: More than three times a week    Frequency of Social Gatherings with Friends and Family: More than three times a week    Attends Religious Services: More than 4 times per year    Active Member of [employer] or Organizations: No    Attends Banker Meetings: Never    Marital Status: Divorced  Catering manager Violence: Not At Risk (01/15/2024)   Humiliation, Afraid, Rape, and Kick questionnaire    Fear of Current or Ex-Partner: No    Emotionally Abused: No    Physically Abused: No    Sexually Abused: No    Family History: Family History  Problem Relation Age of Onset   COPD Father     Current Medications:  Current Outpatient Medications:    benzonatate  (TESSALON ) 200 MG capsule, Take 1 capsule (200 mg total) by mouth 3 (three) times daily as needed for cough., Disp: 30 capsule, Rfl: 0   acetaminophen  (TYLENOL ) 500 MG tablet, Take 2 tablets (1,000 mg total) by mouth every 6 (six) hours., Disp: , Rfl:    amLODipine  (NORVASC ) 10 MG tablet, Take 1 tablet (10 mg total) by mouth daily., Disp: 90 tablet, Rfl: 3   bumetanide  (BUMEX ) 2 MG tablet, Take 1 tablet (2 mg total) by mouth daily as needed. (Patient taking differently: Take 2 mg by mouth daily.), Disp: , Rfl:    buPROPion  (WELLBUTRIN  XL) 300 MG 24 hr tablet, Take 300 mg by  mouth daily., Disp: , Rfl:    cetirizine (ZYRTEC) 10 MG tablet, Take 10 mg by mouth daily., Disp: , Rfl:    cyclobenzaprine  (FLEXERIL ) 10 MG tablet, Take 10 mg by mouth every 6 (six) hours as needed., Disp: , Rfl:    dapagliflozin propanediol (FARXIGA) 10 MG TABS tablet, Take 10 mg by mouth daily., Disp: , Rfl:    docusate sodium (COLACE) 100 MG capsule, Take 100-200 mg by mouth at bedtime as needed for mild constipation., Disp: , Rfl:    Durvalumab  (IMFINZI  IV), Inject into the vein every 21 ( twenty-one) days., Disp: , Rfl:    Eszopiclone 3 MG TABS, Take 3 mg by mouth at bedtime., Disp: , Rfl:    famotidine  (PEPCID ) 20 MG tablet, Take 20 mg by mouth at bedtime., Disp: , Rfl:    fenofibrate  160 MG tablet, Take 160 mg by mouth daily., Disp: , Rfl:    FLUoxetine  (PROZAC ) 10 MG capsule, Take 10 mg by mouth daily., Disp: , Rfl:    gabapentin  (NEURONTIN ) 300 MG capsule, Take 300 mg by mouth every 6 (six) hours as needed (pain)., Disp: , Rfl:    guaiFENesin (MUCINEX) 600 MG 12 hr tablet, Take 600 mg by mouth 2 (two) times daily as needed for cough or to loosen phlegm., Disp: , Rfl:    magnesium  oxide (MAG-OX) 400 (240 Mg) MG tablet, Take 1 tablet (400 mg total) by mouth 2 (two) times daily., Disp: 90 tablet, Rfl: 6   metFORMIN  (GLUCOPHAGE -XR) 500 MG 24 hr tablet, Take 500 mg by mouth 2 (two) times daily., Disp: , Rfl:    Multiple Vitamin (MULTIVITAMIN WITH MINERALS) TABS tablet, Take 1 tablet by mouth daily., Disp: , Rfl:    nitroGLYCERIN  (NITROSTAT ) 0.4 MG SL tablet, Place 0.4 mg under the tongue every 5 (five) minutes x 3 doses as needed for chest pain (if no relief after 2nd dose, proceed to ED or call 911)., Disp: , Rfl:    NON FORMULARY, Pt uses a cpap, Disp: , Rfl:  olmesartan  (BENICAR ) 40 MG tablet, TAKE ONE TABLET BY MOUTH EVERY DAY, Disp: 90 tablet, Rfl: 2   omeprazole (PRILOSEC) 40 MG capsule, Take 40 mg by mouth daily., Disp: , Rfl:    oxyCODONE  (OXY IR/ROXICODONE ) 5 MG immediate release  tablet, Take 1-2 tablets (5-10 mg total) by mouth every 4 (four) hours as needed for severe pain (pain score 7-10). Take 2 tabs only for severe pain, Disp: 42 tablet, Rfl: 0   polyethylene glycol (MIRALAX  / GLYCOLAX ) 17 g packet, Take 17 g by mouth 2 (two) times daily., Disp: 14 each, Rfl: 0   pramipexole  (MIRAPEX ) 0.75 MG tablet, Take 1 tablet (0.75 mg total) by mouth 3 (three) times daily., Disp: 90 tablet, Rfl: 0   prochlorperazine  (COMPAZINE ) 10 MG tablet, Take 1 tablet (10 mg total) by mouth every 6 (six) hours as needed for nausea or vomiting., Disp: 30 tablet, Rfl: 2   Saw Palmetto, Serenoa repens, (SAW PALMETTO PO), Take 1 capsule by mouth daily., Disp: , Rfl:    Semaglutide  14 MG TABS, Take 14 mg by mouth daily., Disp: , Rfl:    spironolactone  (ALDACTONE ) 100 MG tablet, Take 1 tablet (100 mg total) by mouth daily., Disp: 30 tablet, Rfl: 1   tamsulosin  (FLOMAX ) 0.4 MG CAPS capsule, Take 0.4 mg by mouth daily., Disp: , Rfl:    tiZANidine  (ZANAFLEX ) 4 MG tablet, Take 1 tablet (4 mg total) by mouth every 8 (eight) hours as needed for muscle spasms., Disp: 40 tablet, Rfl: 1   traMADol  (ULTRAM ) 50 MG tablet, TAKE ONE TABLET BY MOUTH AT BEDTIME AS NEEDED FOR PAIN, Disp: , Rfl:    zaleplon  (SONATA ) 10 MG capsule, TAKE ONE CAPSULE BY MOUTH AT BEDTIME AS NEEDED FOR SLEEP, Disp: 30 capsule, Rfl: 5 No current facility-administered medications for this visit.  Facility-Administered Medications Ordered in Other Visits:    magnesium  sulfate 2 GM/50ML IVPB, , , ,    Allergies: No Known Allergies  REVIEW OF SYSTEMS:   Review of Systems  Constitutional:  Negative for chills, fatigue and fever.  HENT:   Negative for lump/mass, mouth sores, nosebleeds, sore throat and trouble swallowing.   Eyes:  Negative for eye problems.  Respiratory:  Positive for cough and shortness of breath.   Cardiovascular:  Negative for chest pain, leg swelling and palpitations.  Gastrointestinal:  Positive for constipation.  Negative for abdominal pain, diarrhea, nausea and vomiting.  Genitourinary:  Negative for bladder incontinence, difficulty urinating, dysuria, frequency, hematuria and nocturia.   Musculoskeletal:  Negative for arthralgias, back pain, flank pain, myalgias and neck pain.  Skin:  Negative for itching and rash.  Neurological:  Positive for numbness (in feet and toes). Negative for dizziness and headaches.  Hematological:  Does not bruise/bleed easily.  Psychiatric/Behavioral:  Positive for sleep disturbance. Negative for depression and suicidal ideas. The patient is not nervous/anxious.   All other systems reviewed and are negative.    VITALS:   There were no vitals taken for this visit.  Wt Readings from Last 3 Encounters:  04/15/24 299 lb 9.6 oz (135.9 kg)  04/01/24 289 lb 0.4 oz (131.1 kg)  03/18/24 296 lb 15.4 oz (134.7 kg)    There is no height or weight on file to calculate BMI.  Performance status (ECOG): 1 - Symptomatic but completely ambulatory  PHYSICAL EXAM:   Physical Exam Vitals and nursing note reviewed. Exam conducted with a chaperone present.  Constitutional:      Appearance: Normal appearance.  Cardiovascular:  Rate and Rhythm: Normal rate and regular rhythm.     Pulses: Normal pulses.     Heart sounds: Normal heart sounds.  Pulmonary:     Effort: Pulmonary effort is normal.     Breath sounds: Normal breath sounds.  Abdominal:     Palpations: Abdomen is soft. There is no hepatomegaly, splenomegaly or mass.     Tenderness: There is no abdominal tenderness.  Musculoskeletal:     Right lower leg: No edema.     Left lower leg: No edema.  Lymphadenopathy:     Cervical: No cervical adenopathy.     Right cervical: No superficial, deep or posterior cervical adenopathy.    Left cervical: No superficial, deep or posterior cervical adenopathy.     Upper Body:     Right upper body: No supraclavicular or axillary adenopathy.     Left upper body: No supraclavicular  or axillary adenopathy.  Neurological:     General: No focal deficit present.     Mental Status: He is alert and oriented to person, place, and time.  Psychiatric:        Mood and Affect: Mood normal.        Behavior: Behavior normal.     LABS:      Latest Ref Rng & Units 04/15/2024    8:05 AM 04/01/2024    7:50 AM 03/18/2024    7:53 AM  CBC  WBC 4.0 - 10.5 K/uL 6.5  5.9  5.7   Hemoglobin 13.0 - 17.0 g/dL 89.6  89.0  89.6   Hematocrit 39.0 - 52.0 % 33.7  34.1  33.1   Platelets 150 - 400 K/uL 296  339  234       Latest Ref Rng & Units 04/15/2024    8:05 AM 04/01/2024    7:50 AM 03/18/2024    7:53 AM  CMP  Glucose 70 - 99 mg/dL 825  843  862   BUN 8 - 23 mg/dL 26  25  20    Creatinine 0.61 - 1.24 mg/dL 9.03  8.93  8.80   Sodium 135 - 145 mmol/L 137  130  134   Potassium 3.5 - 5.1 mmol/L 4.0  3.1  4.0   Chloride 98 - 111 mmol/L 101  93  100   CO2 22 - 32 mmol/L 26  25  24    Calcium  8.9 - 10.3 mg/dL 9.0  8.9  8.7   Total Protein 6.5 - 8.1 g/dL 6.2  6.4  6.1   Total Bilirubin 0.0 - 1.2 mg/dL 0.6  0.6  0.5   Alkaline Phos 38 - 126 U/L 83  74  66   AST 15 - 41 U/L 21  27  23    ALT 0 - 44 U/L 20  20  21       Lab Results  Component Value Date   CEA1 11.1 (H) 04/01/2024   /  CEA  Date Value Ref Range Status  04/01/2024 11.1 (H) 0.0 - 4.7 ng/mL Final    Comment:    (NOTE)                             Nonsmokers          <3.9                             Smokers             <  5.6 [employer] Electrochemiluminescence Immunoassay (ECLIA) Values obtained with different assay methods or kits cannot be used interchangeably.  Results cannot be interpreted as absolute evidence of the presence or absence of malignant disease. Performed At: [facility] [address] [place], [place] [id number] Jennette Shorter MD Ey:[phone]    No results found for: PSA1 Lab Results  Component Value Date   RJW800 133 (H) 04/01/2024   No results found for: CAN125  No results  found for: STEPHANY RINGS, A1GS, A2GS, BETS, BETA2SER, GAMS, MSPIKE, SPEI Lab Results  Component Value Date   TIBC 428 01/29/2023   TIBC 457 (H) 01/04/2022   FERRITIN 183 01/29/2023   FERRITIN 522 (H) 01/04/2022   IRONPCTSAT 15 01/29/2023   IRONPCTSAT 16 (L) 01/04/2022   Lab Results  Component Value Date   LDH 266 (H) 10/05/2021   LDH 242 (H) 09/01/2021     STUDIES:   MR LIVER W WO CONTRAST Result Date: 04/08/2024 CLINICAL DATA:  History of cholangiocarcinoma with hepatic metastases. EXAM: MRI ABDOMEN WITHOUT AND WITH CONTRAST TECHNIQUE: Multiplanar multisequence MR imaging of the abdomen was performed both before and after the administration of intravenous contrast. CONTRAST:  10mL GADAVIST  GADOBUTROL  1 MMOL/ML IV SOLN COMPARISON:  CT scan 04/04/2024 FINDINGS: Lower chest: The lung bases are grossly clear. No infiltrates or effusions. No obvious pulmonary lesions. No pericardial effusion. Hepatobiliary: There are several small metastatic lesions involving the liver. These demonstrate low T1 and high T2 signal intensity and subsequent contrast enhancement. Segment 2 lesion on image 41/12 measures 12 mm. Segment 4 B lesion on image 48/12 measures 14 mm. 12 mm lesion noted in segment 7/8 on image 49/12. 14 mm segment 7 lesion on image 49/12. Other smaller solid and rim enhancing lesions in both lobes of the liver. Numerous gallstones are noted the gallbladder. No findings for acute cholecystitis. No intra or extrahepatic biliary dilatation. No obvious primary cholangiocarcinoma. Pancreas: Prominent fatty interstices but no mass, inflammation or ductal dilatation. Spleen:  Within normal limits in size and appearance. Adrenals/Urinary Tract:  The adrenal glands and kidneys are. Stomach/Bowel: The stomach, duodenum, visualized small bowel visualized colon are grossly normal. Vascular/Lymphatic: The aorta and branch vessels are patent. The major venous structures are patent.  Other: Better seen on the CT scan is omental and peritoneal surface disease throughout the upper abdomen. This is stable when compared to the recent CT scan. Musculoskeletal: No significant bony findings. IMPRESSION: 1. Multiple small metastatic lesions involving both lobes of the liver. 2. Omental and peritoneal surface disease throughout the upper abdomen, better seen on the CT scan. 3. Small volume ascites. 4. Cholelithiasis but no findings for acute cholecystitis. Electronically Signed   By: MYRTIS Stammer M.D.   On: 04/08/2024 19:00   CT CHEST ABDOMEN PELVIS W CONTRAST Result Date: 04/07/2024 CLINICAL DATA:  Cholangiocarcinoma of liver metastasis. * Tracking Code: BO * EXAM: CT CHEST, ABDOMEN, AND PELVIS WITH CONTRAST TECHNIQUE: Multidetector CT imaging of the chest, abdomen and pelvis was performed following the standard protocol during bolus administration of intravenous contrast. RADIATION DOSE REDUCTION: This exam was performed according to the departmental dose-optimization program which includes automated exposure control, adjustment of the mA and/or kV according to patient size and/or use of iterative reconstruction technique. CONTRAST:  80mL OMNIPAQUE  IOHEXOL  350 MG/ML SOLN COMPARISON:  CT 11/29/2023 FINDINGS: CT CHEST FINDINGS Cardiovascular: Port in the anterior chest wall with tip in distal SVC. No significant vascular findings. Normal heart size. No pericardial effusion. Mediastinum/Nodes: No axillary or supraclavicular  adenopathy. No mediastinal or hilar adenopathy. No pericardial fluid. Esophagus normal. Lungs/Pleura: No suspicious pulmonary nodules. Normal pleural. Airways normal. Musculoskeletal: No aggressive osseous lesion. CT ABDOMEN AND PELVIS FINDINGS Hepatobiliary: Subtle hypodensity in the RIGHT hepatic lobe measuring 9 mm (54/2) gallbladder is collapsed around multiple gallstones. No biliary duct dilatation. Pancreas: Pancreas is normal. No ductal dilatation. No pancreatic  inflammation. Spleen: Normal spleen Adrenals/urinary tract: Adrenal glands and kidneys are normal. The ureters and bladder normal. Stomach/Bowel: Stomach, small bowel, appendix, and cecum are normal. The colon and rectosigmoid colon are normal. Vascular/Lymphatic: Abdominal aorta is normal caliber. There is no retroperitoneal or periportal lymphadenopathy. No pelvic lymphadenopathy. Reproductive: Prostate unremarkable Other: Peritoneal pleural fluid and nodularity appears slightly improved. Small nodule anterior to the spleen on image 59/2 is favor splenule. Adjacent to the splenule the pleural parenchyma the peritoneal thickening appears slightly improved. No measurable discrete lesion. Small amount fluid along along the margin liver (image 64/2) is slightly increased. Musculoskeletal: No aggressive osseous lesion. IMPRESSION: CHEST: No evidence of thoracic metastasis. PELVIS: 1. Subtle hypodensity in the RIGHT hepatic lobe is indeterminate. Recommend attention on follow-up versus MRI with contrast. 2. Peritoneal pleural fluid and nodularity appears slightly improved. 3. Small amount of fluid along the margin of the liver is slightly increased. 4. Cholelithiasis without evidence cholecystitis. Electronically Signed   By: Jackquline Boxer M.D.   On: 04/07/2024 10:00

## 2024-04-17 ENCOUNTER — Encounter: Payer: Self-pay | Admitting: Hematology

## 2024-04-21 ENCOUNTER — Other Ambulatory Visit: Payer: Self-pay | Admitting: *Deleted

## 2024-04-21 DIAGNOSIS — R180 Malignant ascites: Secondary | ICD-10-CM

## 2024-04-21 DIAGNOSIS — C786 Secondary malignant neoplasm of retroperitoneum and peritoneum: Secondary | ICD-10-CM

## 2024-04-21 DIAGNOSIS — C221 Intrahepatic bile duct carcinoma: Secondary | ICD-10-CM

## 2024-04-23 ENCOUNTER — Other Ambulatory Visit: Payer: Self-pay | Admitting: Oncology

## 2024-04-23 ENCOUNTER — Ambulatory Visit (HOSPITAL_COMMUNITY)
Admission: RE | Admit: 2024-04-23 | Discharge: 2024-04-23 | Disposition: A | Discharge status: Home / Self Care | Source: Ambulatory Visit | Attending: Oncology | Admitting: Oncology

## 2024-04-23 ENCOUNTER — Inpatient Hospital Stay

## 2024-04-23 DIAGNOSIS — C801 Malignant (primary) neoplasm, unspecified: Secondary | ICD-10-CM | POA: Insufficient documentation

## 2024-04-23 DIAGNOSIS — R180 Malignant ascites: Secondary | ICD-10-CM

## 2024-04-23 DIAGNOSIS — C221 Intrahepatic bile duct carcinoma: Secondary | ICD-10-CM

## 2024-04-23 DIAGNOSIS — C786 Secondary malignant neoplasm of retroperitoneum and peritoneum: Secondary | ICD-10-CM

## 2024-04-23 DIAGNOSIS — Z5112 Encounter for antineoplastic immunotherapy: Secondary | ICD-10-CM | POA: Diagnosis not present

## 2024-04-23 LAB — CBC WITH DIFFERENTIAL/PLATELET
Abs Immature Granulocytes: 0.4 K/uL — ABNORMAL HIGH (ref 0.00–0.07)
Band Neutrophils: 2 %
Basophils Absolute: 0.1 K/uL (ref 0.0–0.1)
Basophils Relative: 1 %
Eosinophils Absolute: 0 K/uL (ref 0.0–0.5)
Eosinophils Relative: 0 %
HCT: 31 % — ABNORMAL LOW (ref 39.0–52.0)
Hemoglobin: 9.9 g/dL — ABNORMAL LOW (ref 13.0–17.0)
Lymphocytes Relative: 16 %
Lymphs Abs: 0.9 K/uL (ref 0.7–4.0)
MCH: 28 pg (ref 26.0–34.0)
MCHC: 31.9 g/dL (ref 30.0–36.0)
MCV: 87.6 fL (ref 80.0–100.0)
Metamyelocytes Relative: 4 %
Monocytes Absolute: 0.8 K/uL (ref 0.1–1.0)
Monocytes Relative: 14 %
Myelocytes: 3 %
Neutro Abs: 3.6 K/uL (ref 1.7–7.7)
Neutrophils Relative %: 60 %
Platelets: 181 K/uL (ref 150–400)
RBC: 3.54 MIL/uL — ABNORMAL LOW (ref 4.22–5.81)
RDW: 16.2 % — ABNORMAL HIGH (ref 11.5–15.5)
WBC: 5.8 K/uL (ref 4.0–10.5)
nRBC: 2.2 % — ABNORMAL HIGH (ref 0.0–0.2)

## 2024-04-23 LAB — COMPREHENSIVE METABOLIC PANEL WITH GFR
ALT: 32 U/L (ref 0–44)
AST: 23 U/L (ref 15–41)
Albumin: 3 g/dL — ABNORMAL LOW (ref 3.5–5.0)
Alkaline Phosphatase: 88 U/L (ref 38–126)
Anion gap: 15 (ref 5–15)
BUN: 23 mg/dL (ref 8–23)
CO2: 27 mmol/L (ref 22–32)
Calcium: 9 mg/dL (ref 8.9–10.3)
Chloride: 92 mmol/L — ABNORMAL LOW (ref 98–111)
Creatinine, Ser: 0.98 mg/dL (ref 0.61–1.24)
GFR, Estimated: >60 mL/min (ref >60)
Glucose, Bld: 167 mg/dL — ABNORMAL HIGH (ref 70–99)
Potassium: 3.2 mmol/L — ABNORMAL LOW (ref 3.5–5.1)
Sodium: 134 mmol/L — ABNORMAL LOW (ref 135–145)
Total Bilirubin: 0.6 mg/dL (ref 0.0–1.2)
Total Protein: 6.3 g/dL — ABNORMAL LOW (ref 6.5–8.1)

## 2024-04-23 LAB — MAGNESIUM: Magnesium: 1.4 mg/dL — ABNORMAL LOW (ref 1.7–2.4)

## 2024-04-23 LAB — IRON AND TIBC
Iron: 24 ug/dL — ABNORMAL LOW (ref 45–182)
Saturation Ratios: 6 % — ABNORMAL LOW (ref 17.9–39.5)
TIBC: 375 ug/dL (ref 250–450)
UIBC: 351 ug/dL

## 2024-04-23 LAB — FERRITIN: Ferritin: 84 ng/mL (ref 24–336)

## 2024-04-23 NOTE — Progress Notes (Signed)
 Marvin Carr presented for Portacath access and flush with labs.  Portacath located right chest wall accessed with  H 20 needle.  Good blood return present. Portacath flushed with 20ml NS and needle removed intact. No bruising or swelling noted at the site. Patient c/o dizziness and weakness today. Dr.Kandala made aware and stated to collect CBC diff, CMP, and Iron panel. Procedure tolerated well and without incident. Patient advised if any labs  abnormal are found, patient will be notified per Dr.Kandala. Patient made aware and verbalized understanding.  Discharged from clinic via wheelchair in stable condition to radiology for a scheduled paracentesis . Alert and oriented x 3. F/U with [facility] as scheduled.

## 2024-04-24 ENCOUNTER — Other Ambulatory Visit: Payer: Self-pay | Admitting: Oncology

## 2024-04-24 LAB — CANCER ANTIGEN 19-9: CA 19-9: 191 U/mL — ABNORMAL HIGH (ref 0–35)

## 2024-04-24 LAB — CEA: CEA: 13.2 ng/mL — ABNORMAL HIGH (ref 0.0–4.7)

## 2024-04-28 ENCOUNTER — Other Ambulatory Visit: Payer: Self-pay | Admitting: *Deleted

## 2024-04-28 MED ORDER — BENZONATATE 200 MG PO CAPS: 200.0000 mg | ORAL_CAPSULE | Freq: Three times a day (TID) | ORAL | 0 refills | Status: DC | PRN

## 2024-04-29 ENCOUNTER — Inpatient Hospital Stay

## 2024-04-29 ENCOUNTER — Ambulatory Visit: Admitting: Oncology

## 2024-04-29 VITALS — BP 117/70 | HR 81 | Temp 96.7°F | Resp 20

## 2024-04-29 DIAGNOSIS — C787 Secondary malignant neoplasm of liver and intrahepatic bile duct: Secondary | ICD-10-CM

## 2024-04-29 DIAGNOSIS — Z95828 Presence of other vascular implants and grafts: Secondary | ICD-10-CM

## 2024-04-29 DIAGNOSIS — Z5112 Encounter for antineoplastic immunotherapy: Secondary | ICD-10-CM | POA: Diagnosis not present

## 2024-04-29 LAB — COMPREHENSIVE METABOLIC PANEL WITH GFR
ALT: 17 U/L (ref 0–44)
AST: 19 U/L (ref 15–41)
Albumin: 2.9 g/dL — ABNORMAL LOW (ref 3.5–5.0)
Alkaline Phosphatase: 93 U/L (ref 38–126)
Anion gap: 13 (ref 5–15)
BUN: 22 mg/dL (ref 8–23)
CO2: 28 mmol/L (ref 22–32)
Calcium: 8.8 mg/dL — ABNORMAL LOW (ref 8.9–10.3)
Chloride: 93 mmol/L — ABNORMAL LOW (ref 98–111)
Creatinine, Ser: 0.95 mg/dL (ref 0.61–1.24)
GFR, Estimated: >60 mL/min (ref >60)
Glucose, Bld: 198 mg/dL — ABNORMAL HIGH (ref 70–99)
Potassium: 3.1 mmol/L — ABNORMAL LOW (ref 3.5–5.1)
Sodium: 134 mmol/L — ABNORMAL LOW (ref 135–145)
Total Bilirubin: 0.6 mg/dL (ref 0.0–1.2)
Total Protein: 6 g/dL — ABNORMAL LOW (ref 6.5–8.1)

## 2024-04-29 LAB — CBC WITH DIFFERENTIAL/PLATELET
Abs Immature Granulocytes: 0.04 K/uL (ref 0.00–0.07)
Basophils Absolute: 0 K/uL (ref 0.0–0.1)
Basophils Relative: 1 %
Eosinophils Absolute: 0.1 K/uL (ref 0.0–0.5)
Eosinophils Relative: 2 %
HCT: 31.3 % — ABNORMAL LOW (ref 39.0–52.0)
Hemoglobin: 9.8 g/dL — ABNORMAL LOW (ref 13.0–17.0)
Immature Granulocytes: 1 %
Lymphocytes Relative: 13 %
Lymphs Abs: 0.8 K/uL (ref 0.7–4.0)
MCH: 27.4 pg (ref 26.0–34.0)
MCHC: 31.3 g/dL (ref 30.0–36.0)
MCV: 87.4 fL (ref 80.0–100.0)
Monocytes Absolute: 1.1 K/uL — ABNORMAL HIGH (ref 0.1–1.0)
Monocytes Relative: 19 %
Neutro Abs: 4 K/uL (ref 1.7–7.7)
Neutrophils Relative %: 64 %
Platelets: 266 K/uL (ref 150–400)
RBC: 3.58 MIL/uL — ABNORMAL LOW (ref 4.22–5.81)
RDW: 16.9 % — ABNORMAL HIGH (ref 11.5–15.5)
WBC: 6.1 K/uL (ref 4.0–10.5)
nRBC: 0.5 % — ABNORMAL HIGH (ref 0.0–0.2)

## 2024-04-29 LAB — MAGNESIUM: Magnesium: 1.6 mg/dL — ABNORMAL LOW (ref 1.7–2.4)

## 2024-04-29 MED ORDER — SODIUM CHLORIDE 0.9 % IV SOLN
800.0000 mg/m2 | Freq: Once | INTRAVENOUS | Status: AC
  Administered 2024-04-29: 2052 mg via INTRAVENOUS
  Filled 2024-04-29: qty 53.97

## 2024-04-29 MED ORDER — SODIUM CHLORIDE 0.9 % IV SOLN: INTRAVENOUS | Status: DC

## 2024-04-29 MED ORDER — PALONOSETRON HCL INJECTION 0.25 MG/5ML
0.2500 mg | Freq: Once | INTRAVENOUS | Status: AC
  Administered 2024-04-29: 0.25 mg via INTRAVENOUS
  Filled 2024-04-29: qty 5

## 2024-04-29 MED ORDER — MAGNESIUM SULFATE 2 GM/50ML IV SOLN
2.0000 g | Freq: Once | INTRAVENOUS | Status: AC
  Administered 2024-04-29: 2 g via INTRAVENOUS
  Filled 2024-04-29: qty 50

## 2024-04-29 MED ORDER — SODIUM CHLORIDE 0.9 % IV SOLN: Freq: Once | INTRAVENOUS | Status: AC

## 2024-04-29 MED ORDER — DEXAMETHASONE SODIUM PHOSPHATE 10 MG/ML IJ SOLN
10.0000 mg | Freq: Once | INTRAMUSCULAR | Status: AC
Start: 2024-04-29 — End: 2024-04-29
  Administered 2024-04-29: 10 mg via INTRAVENOUS
  Filled 2024-04-29: qty 1

## 2024-04-29 MED ORDER — SODIUM CHLORIDE 0.9 % IV SOLN
150.0000 mg | Freq: Once | INTRAVENOUS | Status: AC
  Administered 2024-04-29: 150 mg via INTRAVENOUS
  Filled 2024-04-29: qty 150

## 2024-04-29 MED ORDER — POTASSIUM CHLORIDE IN NACL 20-0.9 MEQ/L-% IV SOLN
Freq: Once | INTRAVENOUS | Status: AC
  Filled 2024-04-29: qty 1000

## 2024-04-29 MED ORDER — CETIRIZINE HCL 10 MG/ML IV SOLN
10.0000 mg | Freq: Once | INTRAVENOUS | Status: AC
  Administered 2024-04-29: 10 mg via INTRAVENOUS
  Filled 2024-04-29: qty 1

## 2024-04-29 MED ORDER — SODIUM CHLORIDE 0.9 % IV SOLN
1000.0000 mg | Freq: Once | INTRAVENOUS | Status: AC
  Administered 2024-04-29: 1000 mg via INTRAVENOUS
  Filled 2024-04-29: qty 1000

## 2024-04-29 MED ORDER — SODIUM CHLORIDE 0.9 % IV SOLN
INTRAVENOUS | Status: DC
Start: 2024-04-29 — End: 2024-04-29

## 2024-04-29 MED ORDER — SODIUM CHLORIDE 0.9 % IV SOLN
25.0000 mg/m2 | Freq: Once | INTRAVENOUS | Status: AC
  Administered 2024-04-29: 65 mg via INTRAVENOUS
  Filled 2024-04-29: qty 65

## 2024-04-29 MED ORDER — ACETAMINOPHEN 325 MG PO TABS
650.0000 mg | ORAL_TABLET | Freq: Once | ORAL | Status: AC
  Administered 2024-04-29: 650 mg via ORAL
  Filled 2024-04-29: qty 2

## 2024-04-29 NOTE — Progress Notes (Signed)

## 2024-04-29 NOTE — Patient Instructions (Signed)
 [facility] - A DEPT OF [facility]  Discharge Instructions: Thank you for choosing [facility] to provide your oncology and hematology care.  If you have a lab appointment with the Cancer Center - please note that after April 8th, 2024, all labs will be drawn in the cancer center.  You do not have to check in or register with the main entrance as you have in the past but will complete your check-in in the cancer center.  Wear comfortable clothing and clothing appropriate for easy access to any Portacath or PICC line.   We strive to give you quality time with your provider. You may need to reschedule your appointment if you arrive late (15 or more minutes).  Arriving late affects you and other patients whose appointments are after yours.  Also, if you miss three or more appointments without notifying the office, you may be dismissed from the clinic at the provider's discretion.      For prescription refill requests, have your pharmacy contact our office and allow 72 hours for refills to be completed.    Today you received the following chemotherapy and/or immunotherapy agents gemzar and cisplatin.       To help prevent nausea and vomiting after your treatment, we encourage you to take your nausea medication as directed.  BELOW ARE SYMPTOMS THAT SHOULD BE REPORTED IMMEDIATELY: *FEVER GREATER THAN 100.4 F (38 C) OR HIGHER *CHILLS OR SWEATING *NAUSEA AND VOMITING THAT IS NOT CONTROLLED WITH YOUR NAUSEA MEDICATION *UNUSUAL SHORTNESS OF BREATH *UNUSUAL BRUISING OR BLEEDING *URINARY PROBLEMS (pain or burning when urinating, or frequent urination) *BOWEL PROBLEMS (unusual diarrhea, constipation, pain near the anus) TENDERNESS IN MOUTH AND THROAT WITH OR WITHOUT PRESENCE OF ULCERS (sore throat, sores in mouth, or a toothache) UNUSUAL RASH, SWELLING OR PAIN  UNUSUAL VAGINAL DISCHARGE OR ITCHING   Items with * indicate a potential emergency and should  be followed up as soon as possible or go to the Emergency Department if any problems should occur.  Please show the CHEMOTHERAPY ALERT CARD or IMMUNOTHERAPY ALERT CARD at check-in to the Emergency Department and triage nurse.  Should you have questions after your visit or need to cancel or reschedule your appointment, please contact [facility] [place] - A DEPT OF Eligha Br[facility] [phone]  and follow the prompts.  Office hours are 8:00 a.m. to 4:30 p.m. Monday - Friday. Please note that voicemails left after 4:00 p.m. may not be returned until the following business day.  We are closed weekends and major holidays. You have access to a nurse at all times for urgent questions. Please call the main number to the clinic [phone] and follow the prompts.  For any non-urgent questions, you may also contact your provider using MyChart. We now offer e-Visits for anyone 24 and older to request care online for non-urgent symptoms. For details visit [email or web address].   Also download the MyChart app! Go to the app store, search "MyChart", open the app, select [place], and log in with your MyChart username and password.

## 2024-05-01 ENCOUNTER — Other Ambulatory Visit: Payer: Self-pay | Admitting: *Deleted

## 2024-05-01 ENCOUNTER — Encounter: Payer: Self-pay | Admitting: *Deleted

## 2024-05-01 NOTE — Progress Notes (Unsigned)
 Refill request received for Metolazone , however medication was discontinued on 04/15/24 by Dr. Katragadda, therefore will not refill at this time.

## 2024-05-08 ENCOUNTER — Inpatient Hospital Stay (HOSPITAL_BASED_OUTPATIENT_CLINIC_OR_DEPARTMENT_OTHER): Admitting: Oncology

## 2024-05-08 ENCOUNTER — Inpatient Hospital Stay

## 2024-05-08 ENCOUNTER — Inpatient Hospital Stay: Admitting: Oncology

## 2024-05-08 VITALS — Wt 297.0 lb

## 2024-05-08 DIAGNOSIS — C221 Intrahepatic bile duct carcinoma: Secondary | ICD-10-CM

## 2024-05-08 DIAGNOSIS — G4700 Insomnia, unspecified: Secondary | ICD-10-CM

## 2024-05-08 DIAGNOSIS — C787 Secondary malignant neoplasm of liver and intrahepatic bile duct: Secondary | ICD-10-CM

## 2024-05-08 DIAGNOSIS — G63 Polyneuropathy in diseases classified elsewhere: Secondary | ICD-10-CM

## 2024-05-08 DIAGNOSIS — D509 Iron deficiency anemia, unspecified: Secondary | ICD-10-CM | POA: Diagnosis not present

## 2024-05-08 DIAGNOSIS — G2581 Restless legs syndrome: Secondary | ICD-10-CM | POA: Diagnosis not present

## 2024-05-08 DIAGNOSIS — C801 Malignant (primary) neoplasm, unspecified: Secondary | ICD-10-CM

## 2024-05-08 DIAGNOSIS — Z95828 Presence of other vascular implants and grafts: Secondary | ICD-10-CM

## 2024-05-08 DIAGNOSIS — R058 Other specified cough: Secondary | ICD-10-CM

## 2024-05-08 DIAGNOSIS — E876 Hypokalemia: Secondary | ICD-10-CM

## 2024-05-08 DIAGNOSIS — R180 Malignant ascites: Secondary | ICD-10-CM

## 2024-05-08 DIAGNOSIS — Z5112 Encounter for antineoplastic immunotherapy: Secondary | ICD-10-CM | POA: Diagnosis not present

## 2024-05-08 LAB — COMPREHENSIVE METABOLIC PANEL WITH GFR
ALT: 23 U/L (ref 0–44)
AST: 23 U/L (ref 15–41)
Albumin: 2.9 g/dL — ABNORMAL LOW (ref 3.5–5.0)
Alkaline Phosphatase: 92 U/L (ref 38–126)
Anion gap: 16 — ABNORMAL HIGH (ref 5–15)
BUN: 23 mg/dL (ref 8–23)
CO2: 27 mmol/L (ref 22–32)
Calcium: 9.1 mg/dL (ref 8.9–10.3)
Chloride: 90 mmol/L — ABNORMAL LOW (ref 98–111)
Creatinine, Ser: 1.07 mg/dL (ref 0.61–1.24)
GFR, Estimated: >60 mL/min (ref >60)
Glucose, Bld: 231 mg/dL — ABNORMAL HIGH (ref 70–99)
Potassium: 3 mmol/L — ABNORMAL LOW (ref 3.5–5.1)
Sodium: 133 mmol/L — ABNORMAL LOW (ref 135–145)
Total Bilirubin: 0.4 mg/dL (ref 0.0–1.2)
Total Protein: 6.3 g/dL — ABNORMAL LOW (ref 6.5–8.1)

## 2024-05-08 LAB — CBC WITH DIFFERENTIAL/PLATELET
Abs Immature Granulocytes: 0.15 K/uL — ABNORMAL HIGH (ref 0.00–0.07)
Basophils Absolute: 0 K/uL (ref 0.0–0.1)
Basophils Relative: 0 %
Eosinophils Absolute: 0 K/uL (ref 0.0–0.5)
Eosinophils Relative: 1 %
HCT: 32.5 % — ABNORMAL LOW (ref 39.0–52.0)
Hemoglobin: 10 g/dL — ABNORMAL LOW (ref 13.0–17.0)
Immature Granulocytes: 2 %
Lymphocytes Relative: 13 %
Lymphs Abs: 0.9 K/uL (ref 0.7–4.0)
MCH: 27 pg (ref 26.0–34.0)
MCHC: 30.8 g/dL (ref 30.0–36.0)
MCV: 87.8 fL (ref 80.0–100.0)
Monocytes Absolute: 1.5 K/uL — ABNORMAL HIGH (ref 0.1–1.0)
Monocytes Relative: 22 %
Neutro Abs: 4.3 K/uL (ref 1.7–7.7)
Neutrophils Relative %: 62 %
Platelets: 167 K/uL (ref 150–400)
RBC: 3.7 MIL/uL — ABNORMAL LOW (ref 4.22–5.81)
RDW: 18.6 % — ABNORMAL HIGH (ref 11.5–15.5)
WBC: 6.9 K/uL (ref 4.0–10.5)
nRBC: 1 % — ABNORMAL HIGH (ref 0.0–0.2)

## 2024-05-08 LAB — MAGNESIUM: Magnesium: 1.6 mg/dL — ABNORMAL LOW (ref 1.7–2.4)

## 2024-05-08 MED ORDER — MAGNESIUM OXIDE -MG SUPPLEMENT 400 (240 MG) MG PO TABS
400.0000 mg | ORAL_TABLET | Freq: Once | ORAL | Status: AC
  Administered 2024-05-08: 400 mg via ORAL
  Filled 2024-05-08: qty 1

## 2024-05-08 MED ORDER — POTASSIUM CHLORIDE CRYS ER 20 MEQ PO TBCR
40.0000 meq | EXTENDED_RELEASE_TABLET | Freq: Once | ORAL | Status: AC
  Administered 2024-05-08: 40 meq via ORAL
  Filled 2024-05-08: qty 2

## 2024-05-08 NOTE — Progress Notes (Signed)
 Marvin Carr presented for Portacath access and flush. Proper placement of portacath confirmed by CXR. Portacath located right chest wall accessed with  H 20 needle. Good blood return present. Portacath flushed with 20ml NS and needle removed intact. Procedure without incident. Patient tolerated procedure well.

## 2024-05-08 NOTE — Addendum Note (Signed)
 Addended by: JOSHUA IVANOFF E on: 05/08/2024 10:06 AM   Modules accepted: Orders

## 2024-05-08 NOTE — Assessment & Plan Note (Addendum)
 Prampexole

## 2024-05-08 NOTE — Progress Notes (Signed)
 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 male was transferred to my care after his prior physician has left.   ASSESSMENT & PLAN:   Assessment & Plan: Marvin Carr  is a 68 y.o. 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  He knows to contact our office if he develops concerns prior to his next appointment.  *** minutes of total time was spent for this patient encounter, including preparation, face-to-face counseling with the patient and coordination of care, physical exam, and documentation of the encounter. > 50% of the time was spent on counseling as documented under my assessment and plan.    Marvin Carr Marvin Carr,acting as a Neurosurgeon for Marvin Dry, MD.,have documented all relevant documentation on the behalf of Marvin Dry, MD,as directed by  Marvin Dry, MD while in the presence of Marvin Dry, MD.  I, Marvin Dry MD, have reviewed the above documentation for accuracy and completeness, and I agree with the above.     Marvin Carr  [facility] [place] - A DEPT OF Marvin Carr [facility] [address] [place] [address] Dept: [phone] Dept Fax: [phone]   No orders of the defined types were placed in this encounter.     ONCOLOGY HISTORY:   I have reviewed his chart and materials related to his cancer extensively and collaborated history with the patient. 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 Around this time the patient intentionally lost 20 pounds in 3-4 months by cutting back on sugar in his diet and was started on semaglutide .  -06/28/2011: Colonoscopy found: Pancolonic diverticulosis. 4 mm polyp in the descending colon.  -08/29/2021: CT AP: Ill-defined low-density lesions within the liver measuring up to 6.1 cm. Nodularity throughout the peritoneum/omentum anteriorly in the abdomen and pelvis concerning for peritoneal tumor. -09/01/2021: CEA:34.3. CA 19-9: 9. LDH: 242.  -09/08/2021: Initial PET: Signs of hepatic metastatic disease and diffuse peritoneal carcinomatosis. Gastric antral involvement from peritoneal disease is contiguous with hepatic metastatic disease. Nodal involvement in the upper abdomen. Stranding and nodularity throughout the peritoneum including about the gallbladder is related to peritoneal carcinomatosis. -09/15/2021: Liver biopsy.  -Pathology: Poorly differentiated adenocarcinoma with necrosis. Tumor cells are positive for CK7 and CDX2 (weak, focal) and negative for GATA3.  The findings are suggestive of an upper gastrointestinal or pancreatobiliary primary.  In absence of any other lesions, findings are compatible with a primary cholangiocarcinoma.  -Cytology: Malignant cells consistent with adenocarcinoma.  -NGS: No targetable mutations.   PD-L1 (SP142) negative. MSI-stable. 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 -09/21/2021: MRI brain: No acute intracranial pathology or evidence of intracranialmetastatic disease. -09/23/2021: EGD found: 2 cm hiatal hernia, congestive  gastropathy, and normal duodenum.  Colonoscopy found: One 6 mm polyp in the transverse colon. Diverticulosis on the sigmoid colon and descending colon. Normal distal rectum and anal verge.  -10/05/2021 to 05/31/2022: gemcitabine , cisplatin  and durvalumab   -07/12/2022 to 11/07/2023: Single-agent durvalumab   -12/07/2023 to current: gemcitabine , cisplatin  and durvalumab  restarted   Diagnosis: Bladder Cancer  -04/07/2010: TURBT.  Pathology: Low grade papillary urothelial carcinoma. Cytology of bladder washing was positive for atypical urothelial cells.  -Patient reportedly received 1 treatment of intravesical chemotherapy and has been on surveillance since  Current Treatment:  Gemcitabine , cisplatin  and durvalumab    INTERVAL HISTORY:   Marvin Carr is here today for follow up.   I have reviewed the past medical history, past surgical history, social history and family history with the patient and they are unchanged from previous note.  ALLERGIES:  has no known allergies.  MEDICATIONS:  Current Outpatient Medications  Medication Sig Dispense Refill   acetaminophen  (TYLENOL ) 500 MG tablet Take 2 tablets (1,000 mg total) by mouth every 6 (six) hours.     amLODipine  (NORVASC ) 10 MG tablet Take 1 tablet (10 mg total) by mouth daily. 90 tablet 3   benzonatate  (TESSALON ) 200 MG capsule Take 1 capsule (200 mg total) by mouth 3 (three) times daily as needed for cough. 30 capsule 0   bumetanide  (BUMEX ) 2 MG tablet Take 1 tablet (2 mg total) by mouth daily as needed. (Patient taking differently: Take 2 mg by mouth daily.)     buPROPion  (WELLBUTRIN  XL) 300 MG 24 hr tablet Take 300 mg by mouth daily.     cetirizine  (ZYRTEC ) 10 MG tablet Take 10 mg by mouth daily.     cyclobenzaprine  (FLEXERIL ) 10 MG tablet Take 10 mg by mouth every 6 (six) hours as needed.     dapagliflozin propanediol (FARXIGA) 10 MG TABS tablet Take 10 mg by mouth daily.     docusate sodium (COLACE) 100 MG capsule Take 100-200 mg  by mouth at bedtime as needed for mild constipation.     Durvalumab  (IMFINZI  IV) Inject into the vein every 21 ( twenty-one) days.     Eszopiclone 3 MG TABS Take 3 mg by mouth at bedtime.     famotidine  (PEPCID ) 20 MG tablet Take 20 mg by mouth at bedtime.     fenofibrate  160 MG tablet Take 160 mg by mouth daily.     FLUoxetine  (PROZAC ) 10 MG capsule Take 10 mg by mouth daily.     gabapentin  (NEURONTIN ) 300 MG capsule Take 300 mg by mouth every 6 (six) hours as needed (pain).     guaiFENesin (MUCINEX) 600 MG 12 hr tablet Take 600 mg by mouth 2 (two) times daily as needed for cough or to loosen phlegm.     magnesium  oxide (MAG-OX) 400 (240 Mg) MG tablet Take 1 tablet (400 mg total) by mouth 2 (two) times daily. 90 tablet 6   metFORMIN  (GLUCOPHAGE -XR) 500 MG 24 hr tablet Take 500 mg by mouth 2 (two) times daily.     Multiple Vitamin (MULTIVITAMIN WITH MINERALS) TABS tablet Take 1 tablet by mouth daily.     nitroGLYCERIN  (NITROSTAT ) 0.4 MG SL tablet Place 0.4 mg under the tongue every 5 (five) minutes x 3 doses as needed for chest pain (if no relief after 2nd dose, proceed to ED or call 911).     NON FORMULARY Pt uses a cpap     olmesartan  (BENICAR ) 40 MG tablet TAKE ONE TABLET BY MOUTH EVERY DAY 90 tablet 2   omeprazole (PRILOSEC)  40 MG capsule Take 40 mg by mouth daily.     oxyCODONE  (OXY IR/ROXICODONE ) 5 MG immediate release tablet Take 1-2 tablets (5-10 mg total) by mouth every 4 (four) hours as needed for severe pain (pain score 7-10). Take 2 tabs only for severe pain 42 tablet 0   polyethylene glycol (MIRALAX  / GLYCOLAX ) 17 g packet Take 17 g by mouth 2 (two) times daily. 14 each 0   pramipexole  (MIRAPEX ) 0.75 MG tablet Take 1 tablet (0.75 mg total) by mouth 3 (three) times daily. 90 tablet 0   prochlorperazine  (COMPAZINE ) 10 MG tablet Take 1 tablet (10 mg total) by mouth every 6 (six) hours as needed for nausea or vomiting. 30 tablet 2   Saw Palmetto, Serenoa repens, (SAW PALMETTO PO) Take 1  capsule by mouth daily.     Semaglutide  14 MG TABS Take 14 mg by mouth daily.     spironolactone  (ALDACTONE ) 100 MG tablet Take 1 tablet (100 mg total) by mouth daily. 30 tablet 1   tamsulosin  (FLOMAX ) 0.4 MG CAPS capsule Take 0.4 mg by mouth daily.     tiZANidine  (ZANAFLEX ) 4 MG tablet Take 1 tablet (4 mg total) by mouth every 8 (eight) hours as needed for muscle spasms. 40 tablet 1   traMADol  (ULTRAM ) 50 MG tablet TAKE ONE TABLET BY MOUTH AT BEDTIME AS NEEDED FOR PAIN     zaleplon  (SONATA ) 10 MG capsule TAKE ONE CAPSULE BY MOUTH AT BEDTIME AS NEEDED FOR SLEEP 30 capsule 5   No current facility-administered medications for this visit.   Facility-Administered Medications Ordered in Other Visits  Medication Dose Route Frequency Provider Last Rate Last Admin   magnesium  sulfate 2 GM/50ML IVPB             REVIEW OF SYSTEMS:   Constitutional: Denies fevers, chills or abnormal weight loss Eyes: Denies blurriness of vision Ears, nose, mouth, throat, and face: Denies mucositis or sore throat Respiratory: Denies cough, dyspnea or wheezes Cardiovascular: Denies palpitation, chest discomfort or lower extremity swelling Gastrointestinal:  Denies nausea, heartburn or change in bowel habits Skin: Denies abnormal skin rashes Lymphatics: Denies new lymphadenopathy or easy bruising Neurological:Denies numbness, tingling or new weaknesses Behavioral/Psych: Mood is stable, no new changes  All other systems were reviewed with the patient and are negative.   VITALS:  There were no vitals taken for this visit.  Wt Readings from Last 3 Encounters:  04/29/24 296 lb 4.8 oz (134.4 kg)  04/23/24 297 lb 3.2 oz (134.8 kg)  04/15/24 299 lb 9.6 oz (135.9 kg)    There is no height or weight on file to calculate BMI.  Performance status (ECOG): 1 - Symptomatic but completely ambulatory  PHYSICAL EXAM:   GENERAL:alert, no distress and comfortable SKIN: skin color, texture, turgor are normal, no rashes or  significant lesions EYES: normal, Conjunctiva are pink and non-injected, sclera clear OROPHARYNX:no exudate, no erythema and lips, buccal mucosa, and tongue normal  NECK: supple, thyroid  normal size, non-tender, without nodularity LYMPH:  no palpable lymphadenopathy in the cervical, axillary or inguinal LUNGS: clear to auscultation and percussion with normal breathing effort HEART: regular rate & rhythm and no murmurs and no lower extremity edema ABDOMEN:abdomen soft, non-tender and normal bowel sounds Musculoskeletal:no cyanosis of digits and no clubbing  NEURO: alert & oriented x 3 with fluent speech, no focal motor/sensory deficits  LABORATORY DATA:  I have reviewed the data as listed   Lab Results  Component Value Date   WBC 6.1 04/29/2024  NEUTROABS 4.0 04/29/2024   HGB 9.8 (L) 04/29/2024   HCT 31.3 (L) 04/29/2024   MCV 87.4 04/29/2024   PLT 266 04/29/2024     Chemistry      Component Value Date/Time   NA 133 (L) 05/08/2024 0855   K 3.0 (L) 05/08/2024 0855   CL 90 (L) 05/08/2024 0855   CO2 27 05/08/2024 0855   BUN 23 05/08/2024 0855   CREATININE 1.07 05/08/2024 0855      Component Value Date/Time   CALCIUM  9.1 05/08/2024 0855   ALKPHOS 92 05/08/2024 0855   AST 23 05/08/2024 0855   ALT 23 05/08/2024 0855   BILITOT 0.4 05/08/2024 0855       RADIOGRAPHIC STUDIES: I have personally reviewed the radiological images as listed and agreed with the findings in the report.  US  ASCITES (ABDOMEN LIMITED) CLINICAL DATA:  Patient with a history of cholangiocarcinoma with peritoneal carcinomatosis and ascites. Interventional Radiology asked to evaluate patient for possible therapeutic paracentesis.  EXAM: LIMITED ABDOMEN ULTRASOUND FOR ASCITES  TECHNIQUE: Limited ultrasound survey for ascites was performed in all four abdominal quadrants.  COMPARISON:  Ultrasound ascites 02/13/2024, MR liver 04/08/2024  FINDINGS: Limited ultrasound examination of the abdomen  revealed small volume ascites. Fluid volume insufficient to safely perform procedure. No procedure performed.  IMPRESSION: Small volume ascites. No paracentesis performed. Ultrasound images reviewed by Warren Dais, NP  Electronically Signed   By: Ester Sides M.D.   On: 04/24/2024 06:01

## 2024-05-08 NOTE — Assessment & Plan Note (Addendum)
 Patient has neuropathy likely secondary to chemotherapy Previously taking gabapentin  but has not been taking it right now  - Continue to monitor

## 2024-05-08 NOTE — Addendum Note (Signed)
 Addended by: LENON ISAIAH ORN on: 05/08/2024 10:13 AM   Modules accepted: Orders

## 2024-05-08 NOTE — Patient Instructions (Addendum)
 [facility] at [facility] Discharge Instructions   You were seen and examined today by Dr. Davonna.  She reviewed the results of your lab work which are normal/stable.   We will proceed with your treatment next week.   Return as scheduled.    Thank you for choosing [facility] at [facility] to provide your oncology and hematology care.  To afford each patient quality time with our provider, please arrive at least 15 minutes before your scheduled appointment time.   If you have a lab appointment with the Cancer Center please come in thru the Main Entrance and check in at the main information desk.  You need to re-schedule your appointment should you arrive 10 or more minutes late.  We strive to give you quality time with our providers, and arriving late affects you and other patients whose appointments are after yours.  Also, if you no show three or more times for appointments you may be dismissed from the clinic at the providers discretion.     Again, thank you for choosing [facility].  Our hope is that these requests will decrease the amount of time that you wait before being seen by our physicians.       _____________________________________________________________  Should you have questions after your visit to [facility], please contact our office at [phone] and follow the prompts.  Our office hours are 8:00 a.m. and 4:30 p.m. Monday - Friday.  Please note that voicemails left after 4:00 p.m. may not be returned until the following business day.  We are closed weekends and major holidays.  You do have access to a nurse 24-7, just call the main number to the clinic [phone] and do not press any options, hold on the line and a nurse will answer the phone.    For prescription refill requests, have your pharmacy contact our office and allow 72 hours.    Due to Covid, you will need to wear a mask upon entering  the hospital. If you do not have a mask, a mask will be given to you at the Main Entrance upon arrival. For doctor visits, patients may have 1 support person age 64 or older with them. For treatment visits, patients can not have anyone with them due to social distancing guidelines and our immunocompromised population.

## 2024-05-09 ENCOUNTER — Encounter: Payer: Self-pay | Admitting: Oncology

## 2024-05-09 DIAGNOSIS — R180 Malignant ascites: Secondary | ICD-10-CM | POA: Insufficient documentation

## 2024-05-09 DIAGNOSIS — E876 Hypokalemia: Secondary | ICD-10-CM | POA: Insufficient documentation

## 2024-05-09 DIAGNOSIS — R058 Other specified cough: Secondary | ICD-10-CM | POA: Insufficient documentation

## 2024-05-09 DIAGNOSIS — D509 Iron deficiency anemia, unspecified: Secondary | ICD-10-CM | POA: Insufficient documentation

## 2024-05-09 DIAGNOSIS — G4700 Insomnia, unspecified: Secondary | ICD-10-CM | POA: Insufficient documentation

## 2024-05-09 NOTE — Assessment & Plan Note (Signed)
 Magnesium  today: 1.6 Patient takes magnesium  supplementation at home.  Reports no diarrhea from it  - Will supplement magnesium  in clinic today as per protocol - Continue magnesium  pills at home

## 2024-05-09 NOTE — Assessment & Plan Note (Addendum)
 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 S/p first-line treatment with gemcitabine , cisplatin  and durvalumab  and durvalumab  maintenance CT with progression of disease in 09/2023 and 11/2023.  Restarted patient on gemcitabine  and cisplatin  along with durvalumab  Patient missed chemotherapy of about 2 cycles because of hip replacement Recent uptrending cancer markers but likely secondary to missing chemotherapy and response to the treatment  - C4D15 today.  Tolerated previous cycles well.  Will continue with the same dose. - Labs reviewed today: CMP: Mild hyponatremia, hypokalemia with a potassium of 3, normal creatinine.  CBC: Hemoglobin: 10-baseline, iron panel: TSAT: 6, ferritin: 84, recent CA 19-9: 191, CEA: 13.2 - Physical exam stable today.  Okay to proceed with chemotherapy at the same previous doses. -Repeat CT scan in 3 months.Due 06/2024 - Repeat cancer markers with every cycle.  Return to clinic prior to D15 of next cycle

## 2024-05-09 NOTE — Assessment & Plan Note (Signed)
 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  - Continue Bumex  for fluid management. - Monitor weight and fluid retention.

## 2024-05-09 NOTE — Assessment & Plan Note (Signed)
 Chronic dry cough for 3-4 months. Tessalon  perles effective. Previous CT showed subpleural reticulations, likely post-infectious, with no significant findings to explain cough.  - Continue Tessalon  perles for cough management. - Monitor for any changes in cough or respiratory symptoms.

## 2024-05-09 NOTE — Assessment & Plan Note (Signed)
 Potassium: 3  - Will give potassium supplementation in infusion clinic today - Continue taking oral potassium at home

## 2024-05-09 NOTE — Assessment & Plan Note (Signed)
 The most likely cause of his anemia is due to chronic blood loss or malabsorption syndrome. Lab Results  Component Value Date   IRON 24 (L) 04/23/2024   TIBC 375 04/23/2024   FERRITIN 84 04/23/2024   TSAT: 6  -We discussed some of the risks, benefits, and alternatives of intravenous iron infusions. The patient is symptomatic from anemia and the iron level is critically low. He tolerated oral iron supplement poorly and desires to achieve higher levels of iron faster for adequate hematopoesis. Some of the side-effects to be expected including risks of infusion reactions, phlebitis, headaches, nausea and fatigue.  The patient is willing to proceed. Patient education material was dispensed. Goal is to keep ferritin level greater than 50 and resolution of anemia -Start continue oral iron every other day.  Use MiraLAX  for constipation  Repeat labs in 8 weeks to assess response for IV iron

## 2024-05-09 NOTE — Assessment & Plan Note (Signed)
 Continue Lunesta at bedtime as needed.

## 2024-05-13 ENCOUNTER — Inpatient Hospital Stay: Attending: Hematology

## 2024-05-13 VITALS — BP 121/81 | HR 86 | Temp 97.9°F | Resp 18 | Wt 298.1 lb

## 2024-05-13 DIAGNOSIS — Z87891 Personal history of nicotine dependence: Secondary | ICD-10-CM | POA: Insufficient documentation

## 2024-05-13 DIAGNOSIS — R970 Elevated carcinoembryonic antigen [CEA]: Secondary | ICD-10-CM | POA: Insufficient documentation

## 2024-05-13 DIAGNOSIS — G629 Polyneuropathy, unspecified: Secondary | ICD-10-CM | POA: Diagnosis not present

## 2024-05-13 DIAGNOSIS — D509 Iron deficiency anemia, unspecified: Secondary | ICD-10-CM | POA: Insufficient documentation

## 2024-05-13 DIAGNOSIS — G4700 Insomnia, unspecified: Secondary | ICD-10-CM | POA: Diagnosis not present

## 2024-05-13 DIAGNOSIS — G2581 Restless legs syndrome: Secondary | ICD-10-CM | POA: Insufficient documentation

## 2024-05-13 DIAGNOSIS — R180 Malignant ascites: Secondary | ICD-10-CM | POA: Insufficient documentation

## 2024-05-13 DIAGNOSIS — Z5112 Encounter for antineoplastic immunotherapy: Secondary | ICD-10-CM | POA: Insufficient documentation

## 2024-05-13 DIAGNOSIS — C221 Intrahepatic bile duct carcinoma: Secondary | ICD-10-CM | POA: Insufficient documentation

## 2024-05-13 DIAGNOSIS — E876 Hypokalemia: Secondary | ICD-10-CM | POA: Insufficient documentation

## 2024-05-13 DIAGNOSIS — Z5111 Encounter for antineoplastic chemotherapy: Secondary | ICD-10-CM | POA: Insufficient documentation

## 2024-05-13 DIAGNOSIS — Z8551 Personal history of malignant neoplasm of bladder: Secondary | ICD-10-CM | POA: Diagnosis not present

## 2024-05-13 DIAGNOSIS — R053 Chronic cough: Secondary | ICD-10-CM | POA: Diagnosis not present

## 2024-05-13 DIAGNOSIS — Z79899 Other long term (current) drug therapy: Secondary | ICD-10-CM | POA: Insufficient documentation

## 2024-05-13 DIAGNOSIS — Z95828 Presence of other vascular implants and grafts: Secondary | ICD-10-CM

## 2024-05-13 MED ORDER — PALONOSETRON HCL INJECTION 0.25 MG/5ML
0.2500 mg | Freq: Once | INTRAVENOUS | Status: AC
  Administered 2024-05-13: 0.25 mg via INTRAVENOUS
  Filled 2024-05-13: qty 5

## 2024-05-13 MED ORDER — SODIUM CHLORIDE 0.9 % IV SOLN: INTRAVENOUS | Status: DC

## 2024-05-13 MED ORDER — SODIUM CHLORIDE 0.9 % IV SOLN
150.0000 mg | Freq: Once | INTRAVENOUS | Status: AC
  Administered 2024-05-13: 150 mg via INTRAVENOUS
  Filled 2024-05-13: qty 150

## 2024-05-13 MED ORDER — DEXAMETHASONE SODIUM PHOSPHATE 10 MG/ML IJ SOLN
10.0000 mg | Freq: Once | INTRAMUSCULAR | Status: AC
  Administered 2024-05-13: 10 mg via INTRAVENOUS
  Filled 2024-05-13: qty 1

## 2024-05-13 MED ORDER — SODIUM CHLORIDE 0.9 % IV SOLN
1500.0000 mg | Freq: Once | INTRAVENOUS | Status: AC
  Administered 2024-05-13: 1500 mg via INTRAVENOUS
  Filled 2024-05-13: qty 30

## 2024-05-13 MED ORDER — SODIUM CHLORIDE 0.9 % IV SOLN: Freq: Once | INTRAVENOUS | Status: AC

## 2024-05-13 MED ORDER — MAGNESIUM SULFATE 2 GM/50ML IV SOLN
2.0000 g | Freq: Once | INTRAVENOUS | Status: AC
  Administered 2024-05-13: 2 g via INTRAVENOUS
  Filled 2024-05-13: qty 50

## 2024-05-13 MED ORDER — POTASSIUM CHLORIDE IN NACL 20-0.9 MEQ/L-% IV SOLN
Freq: Once | INTRAVENOUS | Status: AC
  Filled 2024-05-13: qty 1000

## 2024-05-13 MED ORDER — SODIUM CHLORIDE 0.9 % IV SOLN
25.0000 mg/m2 | Freq: Once | INTRAVENOUS | Status: AC
  Administered 2024-05-13: 65 mg via INTRAVENOUS
  Filled 2024-05-13: qty 65

## 2024-05-13 MED ORDER — SODIUM CHLORIDE 0.9 % IV SOLN
800.0000 mg/m2 | Freq: Once | INTRAVENOUS | Status: AC
  Administered 2024-05-13: 2052 mg via INTRAVENOUS
  Filled 2024-05-13: qty 53.97

## 2024-05-13 NOTE — Patient Instructions (Signed)

## 2024-05-13 NOTE — Progress Notes (Signed)

## 2024-05-13 NOTE — Progress Notes (Signed)
 Ok to proceed with labs from 05/08/24 per MD  Niels Molt, PharmD

## 2024-05-14 ENCOUNTER — Other Ambulatory Visit: Payer: Self-pay | Admitting: *Deleted

## 2024-05-14 ENCOUNTER — Encounter: Payer: Self-pay | Admitting: Oncology

## 2024-05-14 DIAGNOSIS — C221 Intrahepatic bile duct carcinoma: Secondary | ICD-10-CM

## 2024-05-14 LAB — CANCER ANTIGEN 19-9: CA 19-9: 293 U/mL — ABNORMAL HIGH (ref 0–35)

## 2024-05-14 LAB — CEA: CEA: 13.6 ng/mL — ABNORMAL HIGH (ref 0.0–4.7)

## 2024-05-14 MED ORDER — BENZONATATE 200 MG PO CAPS: 200.0000 mg | ORAL_CAPSULE | Freq: Three times a day (TID) | ORAL | 0 refills | Status: DC | PRN

## 2024-05-14 NOTE — Telephone Encounter (Signed)
**Note De-identified  [place] Obfuscation** Please advise 

## 2024-05-15 ENCOUNTER — Ambulatory Visit (HOSPITAL_COMMUNITY)
Admission: RE | Admit: 2024-05-15 | Discharge: 2024-05-15 | Discharge status: Home / Self Care | Source: Ambulatory Visit | Attending: Oncology

## 2024-05-15 ENCOUNTER — Other Ambulatory Visit: Payer: Self-pay | Admitting: *Deleted

## 2024-05-15 ENCOUNTER — Encounter (HOSPITAL_COMMUNITY): Payer: Self-pay

## 2024-05-15 ENCOUNTER — Other Ambulatory Visit: Payer: Self-pay

## 2024-05-15 ENCOUNTER — Telehealth: Payer: Self-pay

## 2024-05-15 ENCOUNTER — Encounter: Payer: Self-pay | Admitting: Oncology

## 2024-05-15 ENCOUNTER — Emergency Department (HOSPITAL_COMMUNITY)
Admission: EM | Admit: 2024-05-15 | Discharge: 2024-05-15 | Disposition: A | Discharge status: Home / Self Care | Source: Ambulatory Visit | Attending: Emergency Medicine | Admitting: Emergency Medicine

## 2024-05-15 DIAGNOSIS — I2510 Atherosclerotic heart disease of native coronary artery without angina pectoris: Secondary | ICD-10-CM | POA: Insufficient documentation

## 2024-05-15 DIAGNOSIS — C221 Intrahepatic bile duct carcinoma: Secondary | ICD-10-CM

## 2024-05-15 DIAGNOSIS — C786 Secondary malignant neoplasm of retroperitoneum and peritoneum: Secondary | ICD-10-CM | POA: Insufficient documentation

## 2024-05-15 DIAGNOSIS — R180 Malignant ascites: Secondary | ICD-10-CM | POA: Insufficient documentation

## 2024-05-15 DIAGNOSIS — Z95828 Presence of other vascular implants and grafts: Secondary | ICD-10-CM

## 2024-05-15 DIAGNOSIS — Z8551 Personal history of malignant neoplasm of bladder: Secondary | ICD-10-CM | POA: Diagnosis not present

## 2024-05-15 DIAGNOSIS — Z7984 Long term (current) use of oral hypoglycemic drugs: Secondary | ICD-10-CM | POA: Diagnosis not present

## 2024-05-15 DIAGNOSIS — I10 Essential (primary) hypertension: Secondary | ICD-10-CM | POA: Insufficient documentation

## 2024-05-15 DIAGNOSIS — Z8505 Personal history of malignant neoplasm of liver: Secondary | ICD-10-CM | POA: Diagnosis not present

## 2024-05-15 DIAGNOSIS — C787 Secondary malignant neoplasm of liver and intrahepatic bile duct: Secondary | ICD-10-CM | POA: Diagnosis present

## 2024-05-15 DIAGNOSIS — R0600 Dyspnea, unspecified: Secondary | ICD-10-CM | POA: Diagnosis present

## 2024-05-15 HISTORY — DX: Secondary malignant neoplasm of liver and intrahepatic bile duct: C78.7

## 2024-05-15 LAB — URINALYSIS, ROUTINE W REFLEX MICROSCOPIC
Bilirubin Urine: NEGATIVE
Glucose, UA: NEGATIVE mg/dL
Hgb urine dipstick: NEGATIVE
Ketones, ur: NEGATIVE mg/dL
Leukocytes,Ua: NEGATIVE
Nitrite: NEGATIVE
Protein, ur: NEGATIVE mg/dL
Specific Gravity, Urine: 1.036 — ABNORMAL HIGH (ref 1.005–1.030)
pH: 6 (ref 5.0–8.0)

## 2024-05-15 LAB — MAGNESIUM: Magnesium: 1.5 mg/dL — ABNORMAL LOW (ref 1.7–2.4)

## 2024-05-15 LAB — COMPREHENSIVE METABOLIC PANEL WITH GFR
ALT: 36 U/L (ref 0–44)
AST: 44 U/L — ABNORMAL HIGH (ref 15–41)
Albumin: 2.5 g/dL — ABNORMAL LOW (ref 3.5–5.0)
Alkaline Phosphatase: 75 U/L (ref 38–126)
Anion gap: 11 (ref 5–15)
BUN: 29 mg/dL — ABNORMAL HIGH (ref 8–23)
CO2: 26 mmol/L (ref 22–32)
Calcium: 9 mg/dL (ref 8.9–10.3)
Chloride: 99 mmol/L (ref 98–111)
Creatinine, Ser: 0.82 mg/dL (ref 0.61–1.24)
GFR, Estimated: >60 mL/min (ref >60)
Glucose, Bld: 186 mg/dL — ABNORMAL HIGH (ref 70–99)
Potassium: 3.8 mmol/L (ref 3.5–5.1)
Sodium: 136 mmol/L (ref 135–145)
Total Bilirubin: 0.6 mg/dL (ref 0.0–1.2)
Total Protein: 5.4 g/dL — ABNORMAL LOW (ref 6.5–8.1)

## 2024-05-15 LAB — CBC WITH DIFFERENTIAL/PLATELET
Abs Immature Granulocytes: 0.02 K/uL (ref 0.00–0.07)
Basophils Absolute: 0 K/uL (ref 0.0–0.1)
Basophils Relative: 0 %
Eosinophils Absolute: 0.1 K/uL (ref 0.0–0.5)
Eosinophils Relative: 1 %
HCT: 31.4 % — ABNORMAL LOW (ref 39.0–52.0)
Hemoglobin: 9.4 g/dL — ABNORMAL LOW (ref 13.0–17.0)
Immature Granulocytes: 0 %
Lymphocytes Relative: 8 %
Lymphs Abs: 0.5 K/uL — ABNORMAL LOW (ref 0.7–4.0)
MCH: 27.5 pg (ref 26.0–34.0)
MCHC: 29.9 g/dL — ABNORMAL LOW (ref 30.0–36.0)
MCV: 91.8 fL (ref 80.0–100.0)
Monocytes Absolute: 0.2 K/uL (ref 0.1–1.0)
Monocytes Relative: 3 %
Neutro Abs: 5 K/uL (ref 1.7–7.7)
Neutrophils Relative %: 88 %
Platelets: 287 K/uL (ref 150–400)
RBC: 3.42 MIL/uL — ABNORMAL LOW (ref 4.22–5.81)
RDW: 19.9 % — ABNORMAL HIGH (ref 11.5–15.5)
WBC: 5.7 K/uL (ref 4.0–10.5)
nRBC: 0 % (ref 0.0–0.2)

## 2024-05-15 LAB — BRAIN NATRIURETIC PEPTIDE: B Natriuretic Peptide: 115 pg/mL — ABNORMAL HIGH (ref 0.0–100.0)

## 2024-05-15 LAB — PROTIME-INR
INR: 0.9 (ref 0.8–1.2)
Prothrombin Time: 13.1 s (ref 11.4–15.2)

## 2024-05-15 MED ORDER — BENZONATATE 200 MG PO CAPS: 200.0000 mg | ORAL_CAPSULE | Freq: Three times a day (TID) | ORAL | 0 refills | Status: DC | PRN

## 2024-05-15 MED ORDER — IOHEXOL 300 MG/ML  SOLN
100.0000 mL | Freq: Once | INTRAMUSCULAR | Status: AC | PRN
  Administered 2024-05-15: 100 mL via INTRAVENOUS

## 2024-05-15 MED ORDER — HEPARIN SOD (PORK) LOCK FLUSH 100 UNIT/ML IV SOLN
500.0000 [IU] | Freq: Once | INTRAVENOUS | Status: AC
  Administered 2024-05-15: 500 [IU]
  Filled 2024-05-15: qty 5

## 2024-05-15 MED ORDER — ACETAMINOPHEN 500 MG PO TABS
1000.0000 mg | ORAL_TABLET | Freq: Once | ORAL | Status: AC
  Administered 2024-05-15: 1000 mg via ORAL
  Filled 2024-05-15: qty 2

## 2024-05-15 MED ORDER — FUROSEMIDE 10 MG/ML IJ SOLN
40.0000 mg | Freq: Once | INTRAMUSCULAR | Status: AC
  Administered 2024-05-15: 40 mg via INTRAVENOUS
  Filled 2024-05-15: qty 4

## 2024-05-15 MED ORDER — POTASSIUM CHLORIDE CRYS ER 20 MEQ PO TBCR
40.0000 meq | EXTENDED_RELEASE_TABLET | Freq: Once | ORAL | Status: AC
  Administered 2024-05-15: 40 meq via ORAL
  Filled 2024-05-15: qty 2

## 2024-05-15 MED ORDER — PROCHLORPERAZINE MALEATE 10 MG PO TABS: 10.0000 mg | ORAL_TABLET | Freq: Four times a day (QID) | ORAL | 2 refills | Status: DC | PRN

## 2024-05-15 MED ORDER — MAGNESIUM SULFATE 2 GM/50ML IV SOLN
2.0000 g | Freq: Once | INTRAVENOUS | Status: AC
  Administered 2024-05-15: 2 g via INTRAVENOUS
  Filled 2024-05-15: qty 50

## 2024-05-15 NOTE — Discharge Instructions (Signed)
 Thank you for coming to [facility]. You were seen for abdominal swelling and weight gain. We did an exam, labs, and imaging, and these showed increased abdominal ascites and low magnesium . We gave you an extra dose of lasix  here in the ED as well as supplemented your potassium and magnesium . Please call Dr. Davonna tomorrow morning to schedule a paracentesis in the outpatient setting within 1 week.  Do not hesitate to return to the ED or call 911 if you experience: -Worsening symptoms -Abdominal pain -Shortness of breath -Lightheadedness, passing out -Fevers/chills -Anything else that concerns you

## 2024-05-15 NOTE — ED Triage Notes (Signed)
 Pt arrived via POV from home after receiving a phone call from his oncology office advising him to seek treatment in the ER. Pt reports significant weight gain and swelling over the past 2 days of 15 lbs. Pt does report Hx of cancer as well. Pt also concerned about abnormal lab results from this week.

## 2024-05-15 NOTE — ED Provider Notes (Signed)
 [place] EMERGENCY DEPARTMENT AT [facility] Provider Note   CSN: [id number] Arrival date & time: 05/15/24  1709     History  Chief Complaint  Patient presents with   Ascites    Marvin Carr is a very pleasant 68 y.o. 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 Pt reports significant weight gain and swelling over the past 2 days of 15 lbs. he called his oncologist about the weight gain who suggested he come to the ER for evaluation.  He denies any abdominal pain, fever/chills.  Endorses some dyspnea with exertion that is not necessarily new for him and he thinks it is related to the abdominal swelling.  He endorses some lower extremity edema that is also mildly worse.  Denies any urinary symptoms, nausea vomiting diarrhea, cough, chest pain.  He had a CT scan abdomen pelvis today and he has not been given the results of this yet.  He does report that he missed a couple days of his medications because he was very busy in his home.  05/15/24 CT abd pelvis w/ contrast: 1. Hypodense lesions in the liver correspond to previous ring-enhancing lesions on the MRI from 04/08/2024. The hypodensities are more striking compared to the 04/04/2024 CT exam, and mildly enlarged compared to the prior MRI. I am uncertain whether this constellation of findings represents interval effective therapy with increasing central necrosis, versus true enlargement. No new lesions identified. 2. Increased ascites, with some faint areas of enhancement along the ascites suspicious for peritoneal carcinomatosis. 3. New mild dilatation of the distal appendix at 0.9 cm, without obvious immediate surrounding inflammatory findings. Wall potentially incidental, mild acute tip appendicitis is difficult to totally  exclude. 4. Cholelithiasis. 5. Sigmoid colon diverticulosis. 6. Prominent stool throughout the colon favors constipation. 7. Subcutaneous edema along the lower margin of the abdominal pannus, cannot exclude mild panniculitis. 8. Lipoma between the left gluteus medius and gluteus maximus muscles also tracking along the posterior margin of the sciatic nerve on the left. 9.  Aortic Atherosclerosis (ICD10-I70.0).  Past Medical History:  Diagnosis Date   Anxiety    Arthritis    Bladder cancer (HCC) 09/11/2009   CAD (coronary artery disease), native coronary artery    a. Mildly elevated troponin 03/2013, cath with nonobstructive disease including 50% AV groove distal stenosis before large OM   Depression    Essential hypertension    Headache(784.0)    History of migraines   Hyperglycemia    Metastatic cancer to liver [facility])    Mixed hyperlipidemia    Neuropathy    Obesity    Port-A-Cath in place 09/30/2021   Pre-diabetes    Seasonal allergies    Sleep apnea    On CPAP       Home Medications Prior to Admission medications   Medication Sig Start Date End Date Taking? Authorizing Provider  acetaminophen  (TYLENOL ) 500 MG tablet Take 2 tablets (1,000 mg total) by mouth every 6 (six) hours. Patient taking differently: Take 1,000 mg by mouth every 6 (six) hours as needed for mild pain (pain score 1-3). 01/16/24  Yes Patti Rosina SAUNDERS, PA-C  benzonatate  (TESSALON ) 200 MG capsule Take 1 capsule (200 mg total) by mouth 3 (three) times daily as needed for cough. 05/15/24  Yes Davonna Siad, MD  bumetanide  (BUMEX ) 2 MG tablet Take  1 tablet (2 mg total) by mouth daily as needed. Patient taking differently: Take 2 mg by mouth daily. 10/09/23  Yes Strader, [place] M, PA-C  buPROPion  (WELLBUTRIN  XL) 300 MG 24 hr tablet Take 300 mg by mouth daily. 09/17/23  Yes [provider]  cetirizine  (ZYRTEC ) 10 MG tablet Take 10 mg by mouth daily.   Yes [provider]  cyclobenzaprine   (FLEXERIL ) 10 MG tablet Take 10 mg by mouth every 6 (six) hours as needed for muscle spasms. 01/25/24  Yes [provider]  dapagliflozin propanediol (FARXIGA) 10 MG TABS tablet Take 10 mg by mouth daily.   Yes [provider]  docusate sodium (COLACE) 100 MG capsule Take 200 mg by mouth at bedtime.   Yes [provider]  Durvalumab  (IMFINZI  IV) Inject into the vein every 21 ( twenty-one) days. 10/05/21  Yes [provider]  Eszopiclone 3 MG TABS Take 3 mg by mouth at bedtime.   Yes [provider]  famotidine  (PEPCID ) 20 MG tablet Take 20 mg by mouth at bedtime. 09/17/23  Yes [provider]  fenofibrate  160 MG tablet Take 160 mg by mouth daily. 11/05/19  Yes [provider]  FLUoxetine  (PROZAC ) 10 MG capsule Take 10 mg by mouth daily. 09/14/23  Yes [provider]  guaiFENesin (MUCINEX) 600 MG 12 hr tablet Take 600 mg by mouth 2 (two) times daily as needed for cough or to loosen phlegm.   Yes [provider]  magnesium  oxide (MAG-OX) 400 (240 Mg) MG tablet Take 1 tablet (400 mg total) by mouth 2 (two) times daily. 11/16/21  Yes Rogers Hai, MD  metFORMIN  (GLUCOPHAGE -XR) 500 MG 24 hr tablet Take 500 mg by mouth 2 (two) times daily. 08/16/21  Yes [provider]  Multiple Vitamin (MULTIVITAMIN WITH MINERALS) TABS tablet Take 1 tablet by mouth daily.   Yes [provider]  olmesartan  (BENICAR ) 40 MG tablet TAKE ONE TABLET BY MOUTH EVERY DAY 01/27/24  Yes Katragadda, Sreedhar, MD  omeprazole (PRILOSEC) 40 MG capsule Take 40 mg by mouth daily as needed (heartburn, acid reflux). 01/25/24  Yes [provider]  pramipexole  (MIRAPEX ) 0.75 MG tablet Take 1 tablet (0.75 mg total) by mouth 3 (three) times daily. Patient taking differently: Take 0.75 mg by mouth in the morning, at noon, in the evening, and at bedtime. 08/30/22  Yes Rogers Hai, MD  prochlorperazine  (COMPAZINE ) 10 MG tablet Take 1  tablet (10 mg total) by mouth every 6 (six) hours as needed for nausea or vomiting. 05/15/24  Yes Davonna Siad, MD  Semaglutide  14 MG TABS Take 14 mg by mouth daily. 08/16/21  Yes [provider]  tamsulosin  (FLOMAX ) 0.4 MG CAPS capsule Take 0.4 mg by mouth daily. 08/29/21  Yes [provider]  traMADol  (ULTRAM ) 50 MG tablet Take 50 mg by mouth at bedtime as needed for severe pain (pain score 7-10).   Yes [provider]  zaleplon  (SONATA ) 10 MG capsule TAKE ONE CAPSULE BY MOUTH AT BEDTIME AS NEEDED FOR SLEEP Patient taking differently: Take 10 mg by mouth at bedtime as needed for sleep. 03/05/24  Yes Rogers Hai, MD  nitroGLYCERIN  (NITROSTAT ) 0.4 MG SL tablet Place 0.4 mg under the tongue every 5 (five) minutes x 3 doses as needed for chest pain (if no relief after 2nd dose, proceed to ED or call 911).    [provider]      Allergies    Patient has no known allergies.  Review of Systems   Review of Systems A 10 point review of systems was performed and is negative unless otherwise reported in HPI.  Physical Exam Updated Vital Signs BP 134/79 (BP Location: Right Arm)   Pulse 96   Temp 98.4 F (36.9 C) (Oral)   Resp 20   Ht 6' (1.829 m)   Wt (!) 139.6 kg   SpO2 93%   BMI 41.75 kg/m  Physical Exam General: Normal appearing male, lying in bed.  HEENT: PERRLA, Sclera anicteric, MMM, trachea midline.  Cardiology: RRR, no murmurs/rubs/gallops. Resp: Normal respiratory rate and effort. CTAB, no wheezes, rhonchi, crackles.  Abd: Large rotund abdomen.  Soft, non-tender, non-distended. No rebound tenderness or guarding.  Fluid wave. GU: Deferred. MSK: 1+ pitting symmetric bilateral lower extremity peripheral edema without signs of trauma. Extremities without deformity or TTP. No cyanosis or clubbing. Skin: warm, dry.  Neuro: A&Ox4, CNs II-XII grossly intact. MAEs. Sensation grossly intact.  Psych: Normal mood and affect.   ED Results /  Procedures / Treatments   Labs (all labs ordered are listed, but only abnormal results are displayed) Labs Reviewed  CBC WITH DIFFERENTIAL/PLATELET - Abnormal; Notable for the following components:      Result Value   RBC 3.42 (*)    Hemoglobin 9.4 (*)    HCT 31.4 (*)    MCHC 29.9 (*)    RDW 19.9 (*)    Lymphs Abs 0.5 (*)    All other components within normal limits  COMPREHENSIVE METABOLIC PANEL WITH GFR - Abnormal; Notable for the following components:   Glucose, Bld 186 (*)    BUN 29 (*)    Total Protein 5.4 (*)    Albumin  2.5 (*)    AST 44 (*)    All other components within normal limits  URINALYSIS, ROUTINE W REFLEX MICROSCOPIC - Abnormal; Notable for the following components:   Specific Gravity, Urine 1.036 (*)    All other components within normal limits  BRAIN NATRIURETIC PEPTIDE - Abnormal; Notable for the following components:   B Natriuretic Peptide 115.0 (*)    All other components within normal limits  MAGNESIUM  - Abnormal; Notable for the following components:   Magnesium  1.5 (*)    All other components within normal limits  PROTIME-INR    EKG None  Radiology CT CHEST ABDOMEN PELVIS W CONTRAST Result Date: 05/15/2024 CLINICAL DATA:  Cholangiocarcinoma metastatic to the liver * Tracking Code: BO * EXAM: CT CHEST, ABDOMEN, AND PELVIS WITH CONTRAST TECHNIQUE: Multidetector CT imaging of the chest, abdomen and pelvis was performed following the standard protocol during bolus administration of intravenous contrast. RADIATION DOSE REDUCTION: This exam was performed according to the departmental dose-optimization program which includes automated exposure control, adjustment of the mA and/or kV according to patient size and/or use of iterative reconstruction technique. CONTRAST:  OMNIPAQUE  IOHEXOL  300 MG/ML  SOLN COMPARISON:  04/04/2024 and MRI 04/08/2024 FINDINGS: CT CHEST FINDINGS Cardiovascular: Right Port-A-Cath tip: SVC. Coronary, aortic arch, and branch vessel  atherosclerotic vascular disease. Mediastinum/Nodes: Unremarkable Lungs/Pleura: Stable scarring in the lingula and left lower lobe along the diaphragm. Musculoskeletal: Degenerative glenohumeral arthropathy. Lower thoracic spondylosis. CT ABDOMEN PELVIS FINDINGS Hepatobiliary: Hypodense lesions in the liver correspond to previous ring-enhancing lesions on the MRI from 04/08/2024. The hypodensities are more striking compared to the 04/04/2024 CT exam, and mildly enlarged compared to the prior MRI. For example, the lesion in the lateral segment left hepatic lobe measures 1.5 by 1.0 cm on image 52 series 2, previously  the ring-enhancing portion measured 1.1 cm in diameter. I am uncertain whether this constellation of findings represents interval effective therapy with increasing central necrosis, versus true enlargement. No new lesions identified. Cholelithiasis noted. Pancreas: Unremarkable Spleen: Probable splenule anterior to the rest of the spleen. Adrenals/Urinary Tract: The kidneys and adrenal glands appear normal. Distal ureters and parts of the posterior urinary bladder obscured by streak artifact from the patient's bilateral hip hardware. Stomach/Bowel: Sigmoid colon diverticulosis. Prominent stool throughout the colon favors constipation. There is new mild dilatation of the distal appendix at 0.9 cm on image 97 series 4, without obvious immediate surrounding inflammatory findings. Wall potentially incidental, mild acute tip appendicitis is difficult to totally exclude. Vascular/Lymphatic: Atherosclerosis is present, including aortoiliac atherosclerotic disease. Reproductive: Prostate gland obscured by streak artifact from the patient's hip hardware. Other: Increased ascites. Musculoskeletal: Bilateral hip prostheses. Lipoma between the left gluteus medius and gluteus maximus muscles also tracking along the posterior margin of the sciatic nerve on the left. Subcutaneous edema along the lower margin of the  abdominal pannus, cannot exclude mild panniculitis. IMPRESSION: 1. Hypodense lesions in the liver correspond to previous ring-enhancing lesions on the MRI from 04/08/2024. The hypodensities are more striking compared to the 04/04/2024 CT exam, and mildly enlarged compared to the prior MRI. I am uncertain whether this constellation of findings represents interval effective therapy with increasing central necrosis, versus true enlargement. No new lesions identified. 2. Increased ascites, with some faint areas of enhancement along the ascites suspicious for peritoneal carcinomatosis. 3. New mild dilatation of the distal appendix at 0.9 cm, without obvious immediate surrounding inflammatory findings. Wall potentially incidental, mild acute tip appendicitis is difficult to totally exclude. 4. Cholelithiasis. 5. Sigmoid colon diverticulosis. 6. Prominent stool throughout the colon favors constipation. 7. Subcutaneous edema along the lower margin of the abdominal pannus, cannot exclude mild panniculitis. 8. Lipoma between the left gluteus medius and gluteus maximus muscles also tracking along the posterior margin of the sciatic nerve on the left. 9.  Aortic Atherosclerosis (ICD10-I70.0). Electronically Signed   By: Ryan Salvage M.D.   On: 05/15/2024 15:56    Procedures Procedures    Medications Ordered in ED Medications  potassium chloride  SA (KLOR-CON  M) CR tablet 40 mEq (has no administration in time range)  magnesium  sulfate IVPB 2 g 50 mL (has no administration in time range)  furosemide  (LASIX ) injection 40 mg (has no administration in time range)  acetaminophen  (TYLENOL ) tablet 1,000 mg (has no administration in time range)    ED Course/ Medical Decision Making/ A&P                          Medical Decision Making Amount and/or Complexity of Data Reviewed Labs: ordered.  Risk OTC drugs. Prescription drug management.    This patient presents to the ED for concern of weight gain, this  involves an extensive number of treatment options, and is a complaint that carries with it a high risk of complications and morbidity.  I considered the following differential and admission for this acute, potentially life threatening condition.  Patient is overall well-appearing and hemodynamically stable  MDM:    Patient presents with increased weight gain, increased ascites, and increased lower extremity edema.  Reassuringly his renal function is within normal limits and his electrolytes are also within normal limits and improved from prior last week.  He states he did supplement his potassium.  He does not have any respiratory distress.  He has  no abdominal tenderness to palpation or fever that would indicate SBP.  His imaging today does demonstrate increased ascites likely due to peritoneal carcinomatosis.  Patient does state that he has received therapeutic paracenteses before but it has been a while and he normally has sort of a standing order with his oncologist to receive these in the clinic.  He does have some subcutaneous edema throughout his body wall indicating possible anasarca but no demonstrated panniculitis as was possible on the CT scan.  He also has some possible inflammatory findings around the appendix but he has no report of abdominal pain, nausea vomiting, fever, and no right lower quadrant tenderness on palpation.  I discussed with the patient.  I do not believe he requires an emergent diagnostic or therapeutic paracentesis.  I believe he is stable to follow-up outpatient for this.  I will give him a dose of Lasix  as well as some supplemental potassium and magnesium  here.  I wonder if it is possible that he missed a few days of his diuretics that could have contributed to this weight gain.  I advised him to call Dr. Davonna tomorrow to see about getting a therapeutic paracentesis in the clinic.  I discussed with the patient also extensively about his CT abdomen/pelvis findings today as  well and he reports understanding.  Advised him to take his medications as prescribed.  I do not believe patient has any acute or life-threatening emergencies that would indicate admission for him and I believe he is stable to be discharged.  Patient reports understanding.  All questions answered to patient satisfaction.     Labs: I Ordered, and personally interpreted labs.  The pertinent results include: Those listed above  Additional history obtained from chart review.   Cardiac Monitoring: The patient was maintained on a cardiac monitor.  I personally viewed and interpreted the cardiac monitored which showed an underlying rhythm of: Normal sinus rhythm  Social Determinants of Health:  Lives independently  Disposition:  DC with outpatient follow-up with oncology  Co morbidities that complicate the patient evaluation  Past Medical History:  Diagnosis Date   Anxiety    Arthritis    Bladder cancer (HCC) 09/11/2009   CAD (coronary artery disease), native coronary artery    a. Mildly elevated troponin 03/2013, cath with nonobstructive disease including 50% AV groove distal stenosis before large OM   Depression    Essential hypertension    Headache(784.0)    History of migraines   Hyperglycemia    Metastatic cancer to liver (HCC)    Mixed hyperlipidemia    Neuropathy    Obesity    Port-A-Cath in place 09/30/2021   Pre-diabetes    Seasonal allergies    Sleep apnea    On CPAP     Medicines Meds ordered this encounter  Medications   potassium chloride  SA (KLOR-CON  M) CR tablet 40 mEq   magnesium  sulfate IVPB 2 g 50 mL   furosemide  (LASIX ) injection 40 mg   acetaminophen  (TYLENOL ) tablet 1,000 mg    I have reviewed the patients home medicines and have made adjustments as needed  Problem List / ED Course: Problem List Items Addressed This Visit       Other   Peritoneal carcinomatosis (HCC)   Malignant ascites - Primary   Hypomagnesemia                 This note was created using dictation software, which may contain spelling or grammatical errors.    Franklyn,  Sid SAILOR, MD 05/15/24 2136

## 2024-05-15 NOTE — Telephone Encounter (Signed)
 MyChart message received today stating that patient has gained 15.2lbs in 2 days - refer to patient message.  Attempted to reach patient but was unable to reach the patient directly, detailed VM left asking that the patient go to the ER especially if he has any shortness of breath, increased swelling or pain. This information will also be sent as a response to his patient message.

## 2024-05-15 NOTE — ED Notes (Signed)
 ED Provider at bedside.

## 2024-05-15 NOTE — Progress Notes (Signed)
 Called patient back and instructed him to go to the ED due to increased weight of 15.2lbs in 2 days. Patient verbalized understanding and agreeable. Patient report called the [place], ED Charge RN.

## 2024-05-15 NOTE — ED Notes (Signed)
 Patient requesting pain medication for headache. ER MD made aware

## 2024-05-15 NOTE — ED Notes (Signed)
 Patient called out requesting urinal. Patient stood to use urinal, accidentally missed urinal and urinated in the floor.

## 2024-05-16 ENCOUNTER — Ambulatory Visit (HOSPITAL_COMMUNITY)
Admission: RE | Admit: 2024-05-16 | Discharge: 2024-05-16 | Disposition: A | Discharge status: Home / Self Care | Source: Ambulatory Visit | Attending: Oncology | Admitting: Oncology

## 2024-05-16 ENCOUNTER — Encounter (HOSPITAL_COMMUNITY): Payer: Self-pay

## 2024-05-16 DIAGNOSIS — Z8505 Personal history of malignant neoplasm of liver: Secondary | ICD-10-CM | POA: Diagnosis not present

## 2024-05-16 DIAGNOSIS — C786 Secondary malignant neoplasm of retroperitoneum and peritoneum: Secondary | ICD-10-CM | POA: Diagnosis not present

## 2024-05-16 DIAGNOSIS — C787 Secondary malignant neoplasm of liver and intrahepatic bile duct: Secondary | ICD-10-CM | POA: Diagnosis not present

## 2024-05-16 DIAGNOSIS — R180 Malignant ascites: Secondary | ICD-10-CM | POA: Insufficient documentation

## 2024-05-16 MED ORDER — LIDOCAINE HCL (PF) 2 % IJ SOLN
INTRAMUSCULAR | Status: AC
  Filled 2024-05-16: qty 10

## 2024-05-16 NOTE — Procedures (Signed)
 PROCEDURE SUMMARY:  Successful image-guided paracentesis from the left lower abdomen.  Yielded 4.2 liters of clear, amber fluid.  No immediate complications.  EBL < 1 mL Patient tolerated well.   Specimen was not sent for labs.  Please see imaging section of Epic for full dictation.  Clotilda DELENA Hesselbach PA-C 05/16/2024 2:19 PM

## 2024-05-16 NOTE — Progress Notes (Signed)
 Patient tolerated left sided Paracentesis procedure well today and 4.2 Liters of amber colored ascites removed. Patient verbalized understanding of discharge instructions and ambulatory at departure with no acute distress noted.

## 2024-05-21 ENCOUNTER — Other Ambulatory Visit: Payer: Self-pay | Admitting: *Deleted

## 2024-05-21 ENCOUNTER — Other Ambulatory Visit: Payer: Self-pay | Admitting: Student

## 2024-05-21 DIAGNOSIS — R55 Syncope and collapse: Secondary | ICD-10-CM

## 2024-05-21 MED ORDER — BUMETANIDE 2 MG PO TABS: 2.0000 mg | ORAL_TABLET | Freq: Every day | ORAL | 1 refills | Status: DC | PRN

## 2024-05-21 MED ORDER — BUMETANIDE 2 MG PO TABS: 2.0000 mg | ORAL_TABLET | Freq: Every day | ORAL | 2 refills | Status: DC | PRN

## 2024-05-23 ENCOUNTER — Other Ambulatory Visit: Payer: Self-pay

## 2024-05-23 ENCOUNTER — Encounter: Payer: Self-pay | Admitting: Oncology

## 2024-05-23 ENCOUNTER — Other Ambulatory Visit: Payer: Self-pay | Admitting: Oncology

## 2024-05-23 MED ORDER — LIDOCAINE VISCOUS HCL 2 % MT SOLN: 5.0000 mL | Freq: Three times a day (TID) | OROMUCOSAL | 0 refills | Status: DC | PRN

## 2024-05-26 ENCOUNTER — Other Ambulatory Visit: Payer: Self-pay | Admitting: *Deleted

## 2024-05-27 ENCOUNTER — Inpatient Hospital Stay

## 2024-05-27 ENCOUNTER — Inpatient Hospital Stay (HOSPITAL_BASED_OUTPATIENT_CLINIC_OR_DEPARTMENT_OTHER): Admitting: Oncology

## 2024-05-27 VITALS — BP 125/88 | HR 90 | Temp 97.4°F | Resp 18

## 2024-05-27 DIAGNOSIS — G4700 Insomnia, unspecified: Secondary | ICD-10-CM

## 2024-05-27 DIAGNOSIS — C801 Malignant (primary) neoplasm, unspecified: Secondary | ICD-10-CM

## 2024-05-27 DIAGNOSIS — G2581 Restless legs syndrome: Secondary | ICD-10-CM | POA: Diagnosis not present

## 2024-05-27 DIAGNOSIS — D509 Iron deficiency anemia, unspecified: Secondary | ICD-10-CM

## 2024-05-27 DIAGNOSIS — G629 Polyneuropathy, unspecified: Secondary | ICD-10-CM | POA: Diagnosis not present

## 2024-05-27 DIAGNOSIS — C221 Intrahepatic bile duct carcinoma: Secondary | ICD-10-CM

## 2024-05-27 DIAGNOSIS — Z5112 Encounter for antineoplastic immunotherapy: Secondary | ICD-10-CM | POA: Diagnosis not present

## 2024-05-27 DIAGNOSIS — R180 Malignant ascites: Secondary | ICD-10-CM | POA: Diagnosis not present

## 2024-05-27 DIAGNOSIS — E876 Hypokalemia: Secondary | ICD-10-CM

## 2024-05-27 DIAGNOSIS — R058 Other specified cough: Secondary | ICD-10-CM

## 2024-05-27 DIAGNOSIS — Z95828 Presence of other vascular implants and grafts: Secondary | ICD-10-CM

## 2024-05-27 DIAGNOSIS — R053 Chronic cough: Secondary | ICD-10-CM

## 2024-05-27 LAB — CBC WITH DIFFERENTIAL/PLATELET
Abs Immature Granulocytes: 0.05 K/uL (ref 0.00–0.07)
Basophils Absolute: 0 K/uL (ref 0.0–0.1)
Basophils Relative: 1 %
Eosinophils Absolute: 0.1 K/uL (ref 0.0–0.5)
Eosinophils Relative: 1 %
HCT: 33.9 % — ABNORMAL LOW (ref 39.0–52.0)
Hemoglobin: 10.3 g/dL — ABNORMAL LOW (ref 13.0–17.0)
Immature Granulocytes: 1 %
Lymphocytes Relative: 18 %
Lymphs Abs: 1.3 K/uL (ref 0.7–4.0)
MCH: 28.4 pg (ref 26.0–34.0)
MCHC: 30.4 g/dL (ref 30.0–36.0)
MCV: 93.4 fL (ref 80.0–100.0)
Monocytes Absolute: 1.4 K/uL — ABNORMAL HIGH (ref 0.1–1.0)
Monocytes Relative: 19 %
Neutro Abs: 4.3 K/uL (ref 1.7–7.7)
Neutrophils Relative %: 60 %
Platelets: 242 K/uL (ref 150–400)
RBC: 3.63 MIL/uL — ABNORMAL LOW (ref 4.22–5.81)
RDW: 21.7 % — ABNORMAL HIGH (ref 11.5–15.5)
Smear Review: NORMAL
WBC: 7.2 K/uL (ref 4.0–10.5)
nRBC: 0 % (ref 0.0–0.2)

## 2024-05-27 LAB — MAGNESIUM: Magnesium: 1.7 mg/dL (ref 1.7–2.4)

## 2024-05-27 LAB — COMPREHENSIVE METABOLIC PANEL WITH GFR
ALT: 20 U/L (ref 0–44)
AST: 28 U/L (ref 15–41)
Albumin: 2.9 g/dL — ABNORMAL LOW (ref 3.5–5.0)
Alkaline Phosphatase: 100 U/L (ref 38–126)
Anion gap: 15 (ref 5–15)
BUN: 25 mg/dL — ABNORMAL HIGH (ref 8–23)
CO2: 25 mmol/L (ref 22–32)
Calcium: 8.6 mg/dL — ABNORMAL LOW (ref 8.9–10.3)
Chloride: 97 mmol/L — ABNORMAL LOW (ref 98–111)
Creatinine, Ser: 1.14 mg/dL (ref 0.61–1.24)
GFR, Estimated: >60 mL/min (ref >60)
Glucose, Bld: 103 mg/dL — ABNORMAL HIGH (ref 70–99)
Potassium: 3.1 mmol/L — ABNORMAL LOW (ref 3.5–5.1)
Sodium: 137 mmol/L (ref 135–145)
Total Bilirubin: 0.7 mg/dL (ref 0.0–1.2)
Total Protein: 6.2 g/dL — ABNORMAL LOW (ref 6.5–8.1)

## 2024-05-27 MED ORDER — SODIUM CHLORIDE 0.9 % IV SOLN: INTRAVENOUS | Status: DC

## 2024-05-27 MED ORDER — MAGNESIUM SULFATE 2 GM/50ML IV SOLN
2.0000 g | Freq: Once | INTRAVENOUS | Status: AC
  Administered 2024-05-27: 2 g via INTRAVENOUS
  Filled 2024-05-27: qty 50

## 2024-05-27 MED ORDER — PALONOSETRON HCL INJECTION 0.25 MG/5ML
0.2500 mg | Freq: Once | INTRAVENOUS | Status: AC
  Administered 2024-05-27: 0.25 mg via INTRAVENOUS
  Filled 2024-05-27: qty 5

## 2024-05-27 MED ORDER — DEXAMETHASONE SODIUM PHOSPHATE 10 MG/ML IJ SOLN
10.0000 mg | Freq: Once | INTRAMUSCULAR | Status: AC
  Administered 2024-05-27: 10 mg via INTRAVENOUS
  Filled 2024-05-27: qty 1

## 2024-05-27 MED ORDER — SODIUM CHLORIDE 0.9 % IV SOLN
150.0000 mg | Freq: Once | INTRAVENOUS | Status: AC
  Administered 2024-05-27: 150 mg via INTRAVENOUS
  Filled 2024-05-27: qty 150

## 2024-05-27 MED ORDER — SODIUM CHLORIDE 0.9 % IV SOLN: Freq: Once | INTRAVENOUS | Status: AC

## 2024-05-27 MED ORDER — POTASSIUM CHLORIDE IN NACL 20-0.9 MEQ/L-% IV SOLN
Freq: Once | INTRAVENOUS | Status: AC
  Filled 2024-05-27: qty 1000

## 2024-05-27 MED ORDER — SODIUM CHLORIDE 0.9 % IV SOLN
800.0000 mg/m2 | Freq: Once | INTRAVENOUS | Status: AC
  Administered 2024-05-27: 2052 mg via INTRAVENOUS
  Filled 2024-05-27: qty 53.97

## 2024-05-27 MED ORDER — SODIUM CHLORIDE 0.9 % IV SOLN
25.0000 mg/m2 | Freq: Once | INTRAVENOUS | Status: AC
  Administered 2024-05-27: 65 mg via INTRAVENOUS
  Filled 2024-05-27: qty 65

## 2024-05-27 NOTE — Assessment & Plan Note (Addendum)
 Prampexole

## 2024-05-27 NOTE — Patient Instructions (Signed)
 [facility] at [facility] Discharge Instructions   You were seen and examined today by Dr. Davonna.  She reviewed the results of your lab work which are normal/stable.   She reviewed the results of your CT scan which does not show any clear signs of progression of the cancer. We will obtain an MRI to investigate this further.   We will proceed with your treatment today.   Return as scheduled.    Thank you for choosing [facility] at [facility] to provide your oncology and hematology care.  To afford each patient quality time with our provider, please arrive at least 15 minutes before your scheduled appointment time.   If you have a lab appointment with the Cancer Center please come in thru the Main Entrance and check in at the main information desk.  You need to re-schedule your appointment should you arrive 10 or more minutes late.  We strive to give you quality time with our providers, and arriving late affects you and other patients whose appointments are after yours.  Also, if you no show three or more times for appointments you may be dismissed from the clinic at the providers discretion.     Again, thank you for choosing [facility].  Our hope is that these requests will decrease the amount of time that you wait before being seen by our physicians.       _____________________________________________________________  Should you have questions after your visit to [facility], please contact our office at [phone] and follow the prompts.  Our office hours are 8:00 a.m. and 4:30 p.m. Monday - Friday.  Please note that voicemails left after 4:00 p.m. may not be returned until the following business day.  We are closed weekends and major holidays.  You do have access to a nurse 24-7, just call the main number to the clinic [phone] and do not press any options, hold on the line and a nurse will answer the phone.     For prescription refill requests, have your pharmacy contact our office and allow 72 hours.    Due to Covid, you will need to wear a mask upon entering the hospital. If you do not have a mask, a mask will be given to you at the Main Entrance upon arrival. For doctor visits, patients may have 1 support person age 70 or older with them. For treatment visits, patients can not have anyone with them due to social distancing guidelines and our immunocompromised population.

## 2024-05-27 NOTE — Patient Instructions (Signed)
 [facility] - A DEPT OF [facility]  Discharge Instructions: Thank you for choosing [facility] to provide your oncology and hematology care.  If you have a lab appointment with the Cancer Center - please note that after April 8th, 2024, all labs will be drawn in the cancer center.  You do not have to check in or register with the main entrance as you have in the past but will complete your check-in in the cancer center.  Wear comfortable clothing and clothing appropriate for easy access to any Portacath or PICC line.   We strive to give you quality time with your provider. You may need to reschedule your appointment if you arrive late (15 or more minutes).  Arriving late affects you and other patients whose appointments are after yours.  Also, if you miss three or more appointments without notifying the office, you may be dismissed from the clinic at the provider's discretion.      For prescription refill requests, have your pharmacy contact our office and allow 72 hours for refills to be completed.    Today you received the following chemotherapy and/or immunotherapy agents gemzar and cisplatin.       To help prevent nausea and vomiting after your treatment, we encourage you to take your nausea medication as directed.  BELOW ARE SYMPTOMS THAT SHOULD BE REPORTED IMMEDIATELY: *FEVER GREATER THAN 100.4 F (38 C) OR HIGHER *CHILLS OR SWEATING *NAUSEA AND VOMITING THAT IS NOT CONTROLLED WITH YOUR NAUSEA MEDICATION *UNUSUAL SHORTNESS OF BREATH *UNUSUAL BRUISING OR BLEEDING *URINARY PROBLEMS (pain or burning when urinating, or frequent urination) *BOWEL PROBLEMS (unusual diarrhea, constipation, pain near the anus) TENDERNESS IN MOUTH AND THROAT WITH OR WITHOUT PRESENCE OF ULCERS (sore throat, sores in mouth, or a toothache) UNUSUAL RASH, SWELLING OR PAIN  UNUSUAL VAGINAL DISCHARGE OR ITCHING   Items with * indicate a potential emergency and should  be followed up as soon as possible or go to the Emergency Department if any problems should occur.  Please show the CHEMOTHERAPY ALERT CARD or IMMUNOTHERAPY ALERT CARD at check-in to the Emergency Department and triage nurse.  Should you have questions after your visit or need to cancel or reschedule your appointment, please contact [facility] [place] - A DEPT OF Eligha Br[facility] [phone]  and follow the prompts.  Office hours are 8:00 a.m. to 4:30 p.m. Monday - Friday. Please note that voicemails left after 4:00 p.m. may not be returned until the following business day.  We are closed weekends and major holidays. You have access to a nurse at all times for urgent questions. Please call the main number to the clinic [phone] and follow the prompts.  For any non-urgent questions, you may also contact your provider using MyChart. We now offer e-Visits for anyone 24 and older to request care online for non-urgent symptoms. For details visit [email or web address].   Also download the MyChart app! Go to the app store, search "MyChart", open the app, select [place], and log in with your MyChart username and password.

## 2024-05-27 NOTE — Assessment & Plan Note (Addendum)
 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 S/p first-line treatment with gemcitabine , cisplatin  and durvalumab  and durvalumab  maintenance CT with progression of disease in 09/2023 and 11/2023.  Restarted patient on gemcitabine  and cisplatin  along with durvalumab  Patient missed chemotherapy of about 2 cycles because of hip replacement Recent uptrending cancer markers but likely secondary to missing chemotherapy and response to the treatment Recent CT with no clear evidence of progression  - C5D15 today.  Tolerated previous cycles well.  Will continue with the same dose. - We reviewed the CT scan together. Liver lesion is slightly bigger but no new lesions.  -Will obtain MRI abdomen to further evaluate the liver lesions - Labs reviewed today: CMP: Hypokalemia with a potassium of 3.1, normal creatinine.  CBC: Hemoglobin: 10.3-baseline,recent CA 19-9: 293, CEA: 13.6 - Physical exam stable today.  Okay to proceed with chemotherapy at the same previous doses. - Repeat cancer markers with every cycle.  Return to clinic prior to D15 of next cycle

## 2024-05-27 NOTE — Progress Notes (Signed)

## 2024-05-27 NOTE — Assessment & Plan Note (Addendum)
 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

## 2024-05-27 NOTE — Progress Notes (Signed)
 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 male was transferred to my care after his prior physician has left.   ASSESSMENT & PLAN:   Assessment & Plan: Marvin Carr  is a 68 y.o. 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 S/p first-line treatment with gemcitabine , cisplatin  and durvalumab  and durvalumab  maintenance CT with progression of disease in 09/2023 and 11/2023.  Restarted patient on gemcitabine  and cisplatin  along with durvalumab  Patient missed chemotherapy of about 2 cycles because of hip replacement Recent uptrending cancer markers but likely secondary to missing chemotherapy and response to the treatment Recent CT with no clear evidence of progression  - C5D15 today.  Tolerated previous cycles well.  Will continue with the same dose. - We reviewed the CT scan together. Liver lesion is slightly bigger but no new lesions.  -Will obtain MRI abdomen to further evaluate the liver lesions - Labs reviewed today: CMP: Hypokalemia with a potassium of 3.1, normal creatinine.  CBC: Hemoglobin: 10.3-baseline,recent CA 19-9: 293, CEA: 13.6 - Physical exam stable today.  Okay to proceed with chemotherapy at the same previous doses. - Repeat cancer markers with every cycle.  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  - Continue taking Mirapex  3 times daily Malignant ascites Malignant ascites previously managed with frequent paracentesis, now with decreased frequency of paracentesis and use of Bumex  for swelling. Last paracentesis was 05/15/2024  - Continue Bumex  for fluid management. - Monitor weight and fluid retention.  Dry cough Chronic dry cough for 3-4 months. Tessalon  perles effective. Previous CT showed subpleural reticulations, likely post-infectious, with no significant findings to explain cough.  - Continue Tessalon  perles for cough management. - Monitor for any changes in cough or respiratory symptoms. Hypokalemia Potassium: 3.1  - Will give potassium supplementation in infusion clinic today - Continue taking oral potassium at home Hypomagnesemia Magnesium  today: 1.7 Patient takes magnesium  supplementation at home.  Reports no diarrhea from it  - Will supplement magnesium  in clinic today as per protocol - Continue magnesium  pills at home Insomnia, unspecified type  Continue Lunesta at bedtime as needed.  Iron deficiency anemia, unspecified iron deficiency anemia type Tsat of 6 previously Received 1 dose of monoferric  on 04/29/2024  - Will repeat iron panel in 2 months ie 06/2024   The patient understands the plans discussed today and is in agreement with them.  He knows to contact our office if he develops concerns prior to his next appointment.  30 minutes of total time was spent for this patient encounter, including preparation, face-to-face counseling with the patient and coordination of care, physical exam, and documentation of the encounter. > 50% of the time was spent on counseling as documented under my assessment and plan.  Marvin Carr  Teague,acting as a Neurosurgeon for Mickiel Dry, MD.,have documented all relevant documentation on the behalf of Mickiel Dry, MD,as directed by  Mickiel Dry, MD while in the presence of Mickiel Dry, MD.  I, Mickiel Dry MD, have reviewed the above documentation for accuracy and completeness, and I agree with the above.     Mickiel Dry, MD  [facility] [place] - A DEPT OF [facility] [address] [place] [address] Dept: [phone] Dept Fax: [phone]   Orders Placed This Encounter  Procedures   MR Abdomen W Wo Contrast    Standing Status:   Future    Expected Date:   06/03/2024    Expiration Date:   05/27/2025    If indicated for the ordered procedure, I authorize the administration of contrast media per Radiology protocol:   Yes    What is the patient's sedation requirement?:   No Sedation    Does the patient have a pacemaker or implanted devices?:   No    Preferred imaging location?:   [facility] (table limit - 500lbs)     ONCOLOGY HISTORY:   I have reviewed his chart and materials related to his cancer extensively and collaborated history with the patient. 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 Around this time the patient intentionally lost 20 pounds in 3-4 months by cutting back on sugar in his diet and was started on semaglutide .  -08/29/2021: CT AP: Ill-defined low-density lesions within the liver measuring up to 6.1 cm. Nodularity throughout the peritoneum/omentum anteriorly in the abdomen and pelvis concerning for peritoneal tumor. -09/01/2021: CEA:34.3. CA 19-9: 9. LDH: 242.  -09/08/2021: Initial PET: Signs of hepatic metastatic disease and diffuse peritoneal  carcinomatosis. Gastric antral involvement from peritoneal disease is contiguous with hepatic metastatic disease. Nodal involvement in the upper abdomen. Stranding and nodularity throughout the peritoneum including about the gallbladder is related to peritoneal carcinomatosis. -09/15/2021: Liver biopsy.  -Pathology: Poorly differentiated adenocarcinoma with necrosis. Tumor cells are positive for CK7 and CDX2 (weak, focal) and negative for GATA3.  The findings are suggestive of an upper gastrointestinal or pancreatobiliary primary.  In absence of any other lesions, findings are compatible with a primary cholangiocarcinoma.  -Cytology: Malignant cells consistent with adenocarcinoma.  -NGS: No targetable mutations. PD-L1 (SP142):Negative,0%. 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  -MSI-Stable, p-MMR,TMB: low-59mut/Mb  -BRAF,NTRK1/2/3,RET,BRCA1/2,HER2,FGFR2/3,IDH1/2,KRAS: Negative -09/21/2021: MRI brain: No acute intracranial pathology or evidence of intracranialmetastatic disease. -09/23/2021: EGD : 2 cm hiatal hernia, congestive gastropathy, and normal duodenum.  Colonoscopy: One 6 mm polyp in the transverse colon. Diverticulosis on the sigmoid colon and descending colon. Normal distal rectum and anal verge.  -10/05/2021 -05/31/2022: Gemcitabine , cisplatin  and durvalumab   -12/20/2021-05/23/2022: CT CAP: Treatment response -07/12/2022- 11/07/2023: Maintenance Single-agent durvalumab   -08/07/2022- 06/01/2023: CT CAP: NED/stable findings -10/02/2023: CT CAP: Increasing, developing areas of subtle peritoneal nodularity worrisome for spread of disease. No significant ascites. Scattered mesenteric stranding. Persistent wall thickening and stones in the gallbladder with slight stranding. -11/29/2023: CT CAP: Increased burden of peritoneal/mesenteric nodularity and new trace perihepatic fluid and increased mesenteric edema, concerning for worsening peritoneal carcinomatosis. -12/07/2023 to current: Gemcitabine ,  cisplatin  and durvalumab  restarted. Patient missed chemotherapy from 12/2023-03/10/2024 for hip surgery. Restarted cycle 2 on 03/10/2024 -04/01/2024: CA 19-9:133,  CEA:11.1 -04/04/2024: CT CAP: Subtle hypodensity in the RIGHT hepatic lobe is indeterminate. Peritoneal pleural fluid and nodularity appears slightly improved. Small amount of fluid along the margin of the liver is slightly increased.  - 04/08/2024: MRI liver: Multiple small metastatic lesions involving both lobes of the liver.  Omental and peritoneal surface disease throughout the upper abdomen, better seen on the CT scan. -04/23/2024: CA 19-9:191, CEA:13.2 -05/13/2024: CA 19-9:293, CEA:13.6 -05/15/2024: Hypodense lesions in the liver correspond to previous ring-enhancing lesions on the MRI from 04/08/2024. The hypodensities are more striking compared to the 04/04/2024 CT exam, and mildly enlarged compared to the prior MRI. No new lesions identified. Increased ascites, with some faint areas of enhancement along the ascites suspicious for peritoneal carcinomatosis.   Diagnosis: Bladder Cancer  -04/07/2010: TURBT.  Pathology: Low grade papillary urothelial carcinoma. Cytology of bladder washing was positive for atypical urothelial cells.  -Patient reportedly received 1 treatment of intravesical chemotherapy and has been on surveillance since  Current Treatment:  Gemcitabine , cisplatin  and durvalumab    INTERVAL HISTORY:   Oskar Cretella is here today for follow up.He is accompanied by his friend today.   He is doing well overall. He experiences numbness and tingling primarily in his toes, which is not severe. He previously used gabapentin  for neuropathy but has since stopped, noting an improvement in symptoms. His restless leg syndrome is managed with Mirapex .  He describes a persistent dry cough occurring once or twice daily, severe enough to almost induce vomiting a few days ago. The cough sometimes improves after fluid removal. He  continues to take Tessalon  for the cough.  He recently received an iron infusion and reports feeling slightly less tired, although he did not feel extremely fatigued before. His hemoglobin levels have improved slightly. No significant fatigue or feeling run down. Numbness and tingling in toes but not elsewhere. Restless leg syndrome is controlled. Cough is dry and infrequent, occurring once or twice a day.  I have reviewed the past medical history, past surgical history, social history and family history with the patient and they are unchanged from previous note.  ALLERGIES:  has no known allergies.  MEDICATIONS:  Current Outpatient Medications  Medication Sig Dispense Refill   lidocaine  (XYLOCAINE ) 2 % solution      acetaminophen  (TYLENOL ) 500 MG tablet Take 2 tablets (1,000 mg total) by mouth every 6 (six) hours. (Patient taking differently: Take 1,000 mg by mouth every 6 (six) hours as needed for mild pain (pain score 1-3).)     benzonatate  (TESSALON ) 200 MG capsule Take 1 capsule (200 mg total) by mouth 3 (three) times daily as needed for cough. 30 capsule 0   bumetanide  (BUMEX ) 2 MG tablet Take 1 tablet (2 mg total) by mouth daily as needed. 90 tablet 2   bumetanide  (BUMEX ) 2 MG tablet Take 1 tablet (2 mg total) by mouth daily as needed. 90 tablet 1   buPROPion  (WELLBUTRIN  XL) 300 MG 24 hr tablet Take 300 mg by mouth daily.     cetirizine  (ZYRTEC ) 10 MG tablet Take 10 mg by mouth daily.     cyclobenzaprine  (FLEXERIL ) 10 MG tablet Take 10 mg by mouth every 6 (six) hours as needed for muscle spasms.     dapagliflozin propanediol (FARXIGA) 10 MG TABS tablet Take 10 mg by mouth daily.     docusate sodium (COLACE) 100 MG capsule Take 200 mg by mouth at bedtime.     Durvalumab  (IMFINZI  IV) Inject into the vein every 21 ( twenty-one)  days.     Eszopiclone 3 MG TABS Take 3 mg by mouth at bedtime.     famotidine  (PEPCID ) 20 MG tablet Take 20 mg by mouth at bedtime.     fenofibrate  160 MG tablet  Take 160 mg by mouth daily.     FLUoxetine  (PROZAC ) 10 MG capsule Take 10 mg by mouth daily.     guaiFENesin (MUCINEX) 600 MG 12 hr tablet Take 600 mg by mouth 2 (two) times daily as needed for cough or to loosen phlegm.     magic mouthwash (lidocaine , diphenhydrAMINE , alum & mag hydroxide) suspension Swish and swallow 5 mLs 3 (three) times daily as needed for mouth pain. 360 mL 0   magnesium  oxide (MAG-OX) 400 (240 Mg) MG tablet Take 1 tablet (400 mg total) by mouth 2 (two) times daily. 90 tablet 6   metFORMIN  (GLUCOPHAGE -XR) 500 MG 24 hr tablet Take 500 mg by mouth 2 (two) times daily.     Multiple Vitamin (MULTIVITAMIN WITH MINERALS) TABS tablet Take 1 tablet by mouth daily.     nitroGLYCERIN  (NITROSTAT ) 0.4 MG SL tablet Place 0.4 mg under the tongue every 5 (five) minutes x 3 doses as needed for chest pain (if no relief after 2nd dose, proceed to ED or call 911).     olmesartan  (BENICAR ) 40 MG tablet TAKE ONE TABLET BY MOUTH EVERY DAY 90 tablet 2   omeprazole (PRILOSEC) 40 MG capsule Take 40 mg by mouth daily as needed (heartburn, acid reflux).     pramipexole  (MIRAPEX ) 0.75 MG tablet Take 1 tablet (0.75 mg total) by mouth 3 (three) times daily. (Patient taking differently: Take 0.75 mg by mouth in the morning, at noon, in the evening, and at bedtime.) 90 tablet 0   prochlorperazine  (COMPAZINE ) 10 MG tablet Take 1 tablet (10 mg total) by mouth every 6 (six) hours as needed for nausea or vomiting. 30 tablet 2   Semaglutide  14 MG TABS Take 14 mg by mouth daily.     tamsulosin  (FLOMAX ) 0.4 MG CAPS capsule Take 0.4 mg by mouth daily.     traMADol  (ULTRAM ) 50 MG tablet Take 50 mg by mouth at bedtime as needed for severe pain (pain score 7-10).     zaleplon  (SONATA ) 10 MG capsule TAKE ONE CAPSULE BY MOUTH AT BEDTIME AS NEEDED FOR SLEEP (Patient taking differently: Take 10 mg by mouth at bedtime as needed for sleep.) 30 capsule 5   No current facility-administered medications for this visit.    Facility-Administered Medications Ordered in Other Visits  Medication Dose Route Frequency Provider Last Rate Last Admin   0.9 %  sodium chloride  infusion   Intravenous Once Tyree Vandruff, MD       0.9 %  sodium chloride  infusion   Intravenous Continuous Avram Danielson, MD 10 mL/hr at 05/27/24 0904 New Bag at 05/27/24 0904   CISplatin  (PLATINOL ) 65 mg in sodium chloride  0.9 % 250 mL chemo infusion  25 mg/m2 (Treatment Plan Recorded) Intravenous Once Coolidge Gossard, MD       dexamethasone  (DECADRON ) injection 10 mg  10 mg Intravenous Once Beverly Ferner, MD       fosaprepitant  (EMEND) 150 mg in sodium chloride  0.9 % 145 mL IVPB  150 mg Intravenous Once Torre Schaumburg, MD       gemcitabine  (GEMZAR ) 2,052 mg in sodium chloride  0.9 % 250 mL chemo infusion  800 mg/m2 (Treatment Plan Recorded) Intravenous Once Aydrien Froman, MD       magnesium  sulfate 2 GM/50ML  IVPB            magnesium  sulfate IVPB 2 g 50 mL  2 g Intravenous Once Keighan Amezcua, MD 50 mL/hr at 05/27/24 0910 2 g at 05/27/24 0910   palonosetron  (ALOXI ) injection 0.25 mg  0.25 mg Intravenous Once Davonna Siad, MD        REVIEW OF SYSTEMS:   Constitutional: Denies fevers, chills or abnormal weight loss Eyes: Denies blurriness of vision Ears, nose, mouth, throat, and face: Denies mucositis or sore throat Respiratory: Denies cough, dyspnea or wheezes Cardiovascular: Denies palpitation, chest discomfort or lower extremity swelling Gastrointestinal:  Denies nausea, heartburn or change in bowel habits Skin: Denies abnormal skin rashes Lymphatics: Denies new lymphadenopathy or easy bruising Neurological:Denies numbness, tingling or new weaknesses Behavioral/Psych: Mood is stable, no new changes  All other systems were reviewed with the patient and are negative.   VITALS:  There were no vitals taken for this visit.  Wt Readings from Last 3 Encounters:  05/27/24 293 lb (132.9 kg)  05/15/24 (!) 307 lb 12.8 oz  (139.6 kg)  05/13/24 298 lb 1.6 oz (135.2 kg)    There is no height or weight on file to calculate BMI.  Performance status (ECOG): 1 - Symptomatic but completely ambulatory  PHYSICAL EXAM:   GENERAL:alert, no distress and comfortable, obese LYMPH:  no palpable lymphadenopathy in the cervical, axillary or inguinal LUNGS: clear to auscultation and percussion with normal breathing effort HEART: regular rate & rhythm and no murmurs and B/L pitting pedal edema upto the knees ABDOMEN:abdomen soft, non-tender and normal bowel sounds Musculoskeletal:no cyanosis of digits and no clubbing  NEURO: alert & oriented x 3 with fluent speech, no focal motor/sensory deficits  LABORATORY DATA:  I have reviewed the data as listed   Lab Results  Component Value Date   WBC 7.2 05/27/2024   NEUTROABS 4.3 05/27/2024   HGB 10.3 (L) 05/27/2024   HCT 33.9 (L) 05/27/2024   MCV 93.4 05/27/2024   PLT 242 05/27/2024     Chemistry      Component Value Date/Time   NA 137 05/27/2024 0752   K 3.1 (L) 05/27/2024 0752   CL 97 (L) 05/27/2024 0752   CO2 25 05/27/2024 0752   BUN 25 (H) 05/27/2024 0752   CREATININE 1.14 05/27/2024 0752      Component Value Date/Time   CALCIUM  8.6 (L) 05/27/2024 0752   ALKPHOS 100 05/27/2024 0752   AST 28 05/27/2024 0752   ALT 20 05/27/2024 0752   BILITOT 0.7 05/27/2024 0752       Latest Reference Range & Units 04/23/24 12:45  Iron 45 - 182 ug/dL 24 (L)  UIBC ug/dL 648  TIBC 749 - 549 ug/dL 624  Saturation Ratios 17.9 - 39.5 % 6 (L)  Ferritin 24 - 336 ng/mL 84  (L): Data is abnormally low   Latest Reference Range & Units 05/13/24 08:12  CA 19-9 0 - 35 U/mL 293 (H)  CEA 0.0 - 4.7 ng/mL 13.6 (H)  (H): Data is abnormally high  RADIOGRAPHIC STUDIES: I have personally reviewed the radiological images as listed and agreed with the findings in the report.  CLINICAL DATA:  Cholangiocarcinoma metastatic to the liver   * Tracking Code: BO *   EXAM: CT CHEST,  ABDOMEN, AND PELVIS WITH CONTRAST   TECHNIQUE: Multidetector CT imaging of the chest, abdomen and pelvis was performed following the standard protocol during bolus administration of intravenous contrast.   RADIATION DOSE REDUCTION: This exam was  performed according to the departmental dose-optimization program which includes automated exposure control, adjustment of the mA and/or kV according to patient size and/or use of iterative reconstruction technique.   CONTRAST:  OMNIPAQUE  IOHEXOL  300 MG/ML  SOLN   COMPARISON:  04/04/2024 and MRI 04/08/2024   FINDINGS: CT CHEST FINDINGS   Cardiovascular: Right Port-A-Cath tip: SVC. Coronary, aortic arch, and branch vessel atherosclerotic vascular disease.   Mediastinum/Nodes: Unremarkable   Lungs/Pleura: Stable scarring in the lingula and left lower lobe along the diaphragm.   Musculoskeletal: Degenerative glenohumeral arthropathy. Lower thoracic spondylosis.   CT ABDOMEN PELVIS FINDINGS   Hepatobiliary: Hypodense lesions in the liver correspond to previous ring-enhancing lesions on the MRI from 04/08/2024. The hypodensities are more striking compared to the 04/04/2024 CT exam, and mildly enlarged compared to the prior MRI. For example, the lesion in the lateral segment left hepatic lobe measures 1.5 by 1.0 cm on image 52 series 2, previously the ring-enhancing portion measured 1.1 cm in diameter. I am uncertain whether this constellation of findings represents interval effective therapy with increasing central necrosis, versus true enlargement. No new lesions identified.   Cholelithiasis noted.   Pancreas: Unremarkable   Spleen: Probable splenule anterior to the rest of the spleen.   Adrenals/Urinary Tract: The kidneys and adrenal glands appear normal. Distal ureters and parts of the posterior urinary bladder obscured by streak artifact from the patient's bilateral hip hardware.   Stomach/Bowel: Sigmoid colon  diverticulosis. Prominent stool throughout the colon favors constipation.   There is new mild dilatation of the distal appendix at 0.9 cm on image 97 series 4, without obvious immediate surrounding inflammatory findings. Wall potentially incidental, mild acute tip appendicitis is difficult to totally exclude.   Vascular/Lymphatic: Atherosclerosis is present, including aortoiliac atherosclerotic disease.   Reproductive: Prostate gland obscured by streak artifact from the patient's hip hardware.   Other: Increased ascites.   Musculoskeletal: Bilateral hip prostheses. Lipoma between the left gluteus medius and gluteus maximus muscles also tracking along the posterior margin of the sciatic nerve on the left.   Subcutaneous edema along the lower margin of the abdominal pannus, cannot exclude mild panniculitis.   IMPRESSION: 1. Hypodense lesions in the liver correspond to previous ring-enhancing lesions on the MRI from 04/08/2024. The hypodensities are more striking compared to the 04/04/2024 CT exam, and mildly enlarged compared to the prior MRI. I am uncertain whether this constellation of findings represents interval effective therapy with increasing central necrosis, versus true enlargement. No new lesions identified. 2. Increased ascites, with some faint areas of enhancement along the ascites suspicious for peritoneal carcinomatosis. 3. New mild dilatation of the distal appendix at 0.9 cm, without obvious immediate surrounding inflammatory findings. Wall potentially incidental, mild acute tip appendicitis is difficult to totally exclude. 4. Cholelithiasis. 5. Sigmoid colon diverticulosis. 6. Prominent stool throughout the colon favors constipation. 7. Subcutaneous edema along the lower margin of the abdominal pannus, cannot exclude mild panniculitis. 8. Lipoma between the left gluteus medius and gluteus maximus muscles also tracking along the posterior margin of the  sciatic nerve on the left. 9.  Aortic Atherosclerosis (ICD10-I70.0).     Electronically Signed   By: Ryan Salvage M.D.   On: 05/15/2024 15:56

## 2024-05-27 NOTE — Assessment & Plan Note (Addendum)
 Tsat of 6 previously Received 1 dose of monoferric  on 04/29/2024  - Will repeat iron panel in 2 months ie 06/2024

## 2024-05-27 NOTE — Assessment & Plan Note (Addendum)
 Continue Lunesta at bedtime as needed.

## 2024-05-27 NOTE — Assessment & Plan Note (Addendum)
 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 Last paracentesis was 05/15/2024  - Continue Bumex  for fluid management. - Monitor weight and fluid retention.

## 2024-05-27 NOTE — Assessment & Plan Note (Addendum)
 Potassium: 3.1  - Will give potassium supplementation in infusion clinic today - Continue taking oral potassium at home

## 2024-05-27 NOTE — Assessment & Plan Note (Addendum)
 Chronic dry cough for 3-4 months. Tessalon  perles effective. Previous CT showed subpleural reticulations, likely post-infectious, with no significant findings to explain cough.  - Continue Tessalon  perles for cough management. - Monitor for any changes in cough or respiratory symptoms.

## 2024-05-27 NOTE — Assessment & Plan Note (Addendum)
 Magnesium  today: 1.7 Patient takes magnesium  supplementation at home.  Reports no diarrhea from it  - Will supplement magnesium  in clinic today as per protocol - Continue magnesium  pills at home

## 2024-06-03 ENCOUNTER — Ambulatory Visit (HOSPITAL_COMMUNITY)
Admission: RE | Admit: 2024-06-03 | Discharge: 2024-06-03 | Disposition: A | Discharge status: Home / Self Care | Source: Ambulatory Visit | Attending: Oncology | Admitting: Oncology

## 2024-06-03 DIAGNOSIS — C787 Secondary malignant neoplasm of liver and intrahepatic bile duct: Secondary | ICD-10-CM | POA: Diagnosis present

## 2024-06-03 DIAGNOSIS — C221 Intrahepatic bile duct carcinoma: Secondary | ICD-10-CM | POA: Insufficient documentation

## 2024-06-03 MED ORDER — GADOBUTROL 1 MMOL/ML IV SOLN
10.0000 mL | Freq: Once | INTRAVENOUS | Status: AC | PRN
  Administered 2024-06-03: 10 mL via INTRAVENOUS

## 2024-06-06 ENCOUNTER — Encounter: Payer: Self-pay | Admitting: Oncology

## 2024-06-06 NOTE — Telephone Encounter (Signed)
**Note De-identified  [place] Obfuscation** Please advise 

## 2024-06-10 ENCOUNTER — Inpatient Hospital Stay

## 2024-06-10 VITALS — BP 150/94 | HR 95 | Temp 97.0°F | Resp 16

## 2024-06-10 DIAGNOSIS — Z95828 Presence of other vascular implants and grafts: Secondary | ICD-10-CM

## 2024-06-10 DIAGNOSIS — Z5112 Encounter for antineoplastic immunotherapy: Secondary | ICD-10-CM | POA: Diagnosis not present

## 2024-06-10 DIAGNOSIS — C221 Intrahepatic bile duct carcinoma: Secondary | ICD-10-CM

## 2024-06-10 LAB — CBC WITH DIFFERENTIAL/PLATELET
Abs Immature Granulocytes: 0.06 K/uL (ref 0.00–0.07)
Basophils Absolute: 0 K/uL (ref 0.0–0.1)
Basophils Relative: 0 %
Eosinophils Absolute: 0.1 K/uL (ref 0.0–0.5)
Eosinophils Relative: 1 %
HCT: 32.8 % — ABNORMAL LOW (ref 39.0–52.0)
Hemoglobin: 10.1 g/dL — ABNORMAL LOW (ref 13.0–17.0)
Immature Granulocytes: 1 %
Lymphocytes Relative: 10 %
Lymphs Abs: 0.8 K/uL (ref 0.7–4.0)
MCH: 28.8 pg (ref 26.0–34.0)
MCHC: 30.8 g/dL (ref 30.0–36.0)
MCV: 93.4 fL (ref 80.0–100.0)
Monocytes Absolute: 1.1 K/uL — ABNORMAL HIGH (ref 0.1–1.0)
Monocytes Relative: 14 %
Neutro Abs: 5.6 K/uL (ref 1.7–7.7)
Neutrophils Relative %: 74 %
Platelets: 215 K/uL (ref 150–400)
RBC: 3.51 MIL/uL — ABNORMAL LOW (ref 4.22–5.81)
RDW: 20.5 % — ABNORMAL HIGH (ref 11.5–15.5)
WBC: 7.6 K/uL (ref 4.0–10.5)
nRBC: 0.3 % — ABNORMAL HIGH (ref 0.0–0.2)

## 2024-06-10 LAB — COMPREHENSIVE METABOLIC PANEL WITH GFR
ALT: 16 U/L (ref 0–44)
AST: 20 U/L (ref 15–41)
Albumin: 2.6 g/dL — ABNORMAL LOW (ref 3.5–5.0)
Alkaline Phosphatase: 97 U/L (ref 38–126)
Anion gap: 12 (ref 5–15)
BUN: 21 mg/dL (ref 8–23)
CO2: 26 mmol/L (ref 22–32)
Calcium: 8.6 mg/dL — ABNORMAL LOW (ref 8.9–10.3)
Chloride: 97 mmol/L — ABNORMAL LOW (ref 98–111)
Creatinine, Ser: 0.85 mg/dL (ref 0.61–1.24)
GFR, Estimated: >60 mL/min (ref >60)
Glucose, Bld: 165 mg/dL — ABNORMAL HIGH (ref 70–99)
Potassium: 3.8 mmol/L (ref 3.5–5.1)
Sodium: 135 mmol/L (ref 135–145)
Total Bilirubin: 0.6 mg/dL (ref 0.0–1.2)
Total Protein: 6 g/dL — ABNORMAL LOW (ref 6.5–8.1)

## 2024-06-10 LAB — MAGNESIUM: Magnesium: 1.7 mg/dL (ref 1.7–2.4)

## 2024-06-10 LAB — TSH: TSH: 3.769 u[IU]/mL (ref 0.350–4.500)

## 2024-06-10 MED ORDER — SODIUM CHLORIDE 0.9 % IV SOLN
1500.0000 mg | Freq: Once | INTRAVENOUS | Status: AC
  Administered 2024-06-10: 1500 mg via INTRAVENOUS
  Filled 2024-06-10: qty 30

## 2024-06-10 MED ORDER — SODIUM CHLORIDE 0.9 % IV SOLN: Freq: Once | INTRAVENOUS | Status: AC

## 2024-06-10 MED ORDER — SODIUM CHLORIDE 0.9 % IV SOLN
25.0000 mg/m2 | Freq: Once | INTRAVENOUS | Status: AC
  Administered 2024-06-10: 65 mg via INTRAVENOUS
  Filled 2024-06-10: qty 65

## 2024-06-10 MED ORDER — PALONOSETRON HCL INJECTION 0.25 MG/5ML
0.2500 mg | Freq: Once | INTRAVENOUS | Status: AC
  Administered 2024-06-10: 0.25 mg via INTRAVENOUS
  Filled 2024-06-10: qty 5

## 2024-06-10 MED ORDER — SODIUM CHLORIDE 0.9 % IV SOLN
150.0000 mg | Freq: Once | INTRAVENOUS | Status: AC
  Administered 2024-06-10: 150 mg via INTRAVENOUS
  Filled 2024-06-10: qty 150

## 2024-06-10 MED ORDER — POTASSIUM CHLORIDE IN NACL 20-0.9 MEQ/L-% IV SOLN
Freq: Once | INTRAVENOUS | Status: AC
  Filled 2024-06-10: qty 1000

## 2024-06-10 MED ORDER — MAGNESIUM SULFATE 2 GM/50ML IV SOLN
2.0000 g | Freq: Once | INTRAVENOUS | Status: AC
  Administered 2024-06-10: 2 g via INTRAVENOUS
  Filled 2024-06-10: qty 50

## 2024-06-10 MED ORDER — SODIUM CHLORIDE 0.9 % IV SOLN
800.0000 mg/m2 | Freq: Once | INTRAVENOUS | Status: AC
  Administered 2024-06-10: 2052 mg via INTRAVENOUS
  Filled 2024-06-10: qty 53.97

## 2024-06-10 MED ORDER — SODIUM CHLORIDE 0.9 % IV SOLN: INTRAVENOUS | Status: DC

## 2024-06-10 MED ORDER — DEXAMETHASONE SODIUM PHOSPHATE 10 MG/ML IJ SOLN
10.0000 mg | Freq: Once | INTRAMUSCULAR | Status: AC
  Administered 2024-06-10: 10 mg via INTRAVENOUS
  Filled 2024-06-10: qty 1

## 2024-06-10 NOTE — Patient Instructions (Signed)
 [facility]  - A DEPT OF [facility]  Discharge Instructions: Thank you for choosing [facility] to provide your oncology and hematology care.  If you have a lab appointment with the Cancer Center - please note that after April 8th, 2024, all labs will be drawn in the cancer center.  You do not have to check in or register with the main entrance as you have in the past but will complete your check-in in the cancer center.  Wear comfortable clothing and clothing appropriate for easy access to any Portacath or PICC line.   We strive to give you quality time with your provider. You may need to reschedule your appointment if you arrive late (15 or more minutes).  Arriving late affects you and other patients whose appointments are after yours.  Also, if you miss three or more appointments without notifying the office, you may be dismissed from the clinic at the provider's discretion.      For prescription refill requests, have your pharmacy contact our office and allow 72 hours for refills to be completed.    Today you received the following chemotherapy and/or immunotherapy agents Imfinzi/Gemzar/Cisplatin   To help prevent nausea and vomiting after your treatment, we encourage you to take your nausea medication as directed.  Durvalumab Injection What is this medication? DURVALUMAB (dur VAL ue mab) treats some types of cancer. It works by helping your immune system slow or stop the spread of cancer cells. It is a monoclonal antibody. This medicine may be used for other purposes; ask your health care provider or pharmacist if you have questions. COMMON BRAND NAME(S): IMFINZI What should I tell my care team before I take this medication? They need to know if you have any of these conditions: Allogeneic stem cell transplant (uses someone else's stem cells) Autoimmune diseases, such as Crohn disease, ulcerative colitis, lupus History of chest  radiation Nervous system problems, such as Guillain-Barre syndrome, myasthenia gravis Organ transplant An unusual or allergic reaction to durvalumab, other medications, foods, dyes, or preservatives Pregnant or trying to get pregnant Breast-feeding How should I use this medication? This medication is infused into a vein. It is given by your care team in a hospital or clinic setting. A special MedGuide will be given to you before each treatment. Be sure to read this information carefully each time. Talk to your care team about the use of this medication in children. Special care may be needed. Overdosage: If you think you have taken too much of this medicine contact a poison control center or emergency room at once. NOTE: This medicine is only for you. Do not share this medicine with others. What if I miss a dose? Keep appointments for follow-up doses. It is important not to miss your dose. Call your care team if you are unable to keep an appointment. What may interact with this medication? Interactions have not been studied. This list may not describe all possible interactions. Give your health care provider a list of all the medicines, herbs, non-prescription drugs, or dietary supplements you use. Also tell them if you smoke, drink alcohol, or use illegal drugs. Some items may interact with your medicine. What should I watch for while using this medication? Your condition will be monitored carefully while you are receiving this medication. You may need blood work while taking this medication. This medication may cause serious skin reactions. They can happen weeks to months after starting the medication. Contact  your care team right away if you notice fevers or flu-like symptoms with a rash. The rash may be red or purple and then turn into blisters or peeling of the skin. You may also notice a red rash with swelling of the face, lips, or lymph nodes in your neck or under your arms. Tell your care  team right away if you have any change in your eyesight. Talk to your care team if you may be pregnant. Serious birth defects can occur if you take this medication during pregnancy and for 3 months after the last dose. You will need a negative pregnancy test before starting this medication. Contraception is recommended while taking this medication and for 3 months after the last dose. Your care team can help you find the option that works for you. Do not breastfeed while taking this medication and for 3 months after the last dose. What side effects may I notice from receiving this medication? Side effects that you should report to your care team as soon as possible: Allergic reactions--skin rash, itching, hives, swelling of the face, lips, tongue, or throat Dry cough, shortness of breath or trouble breathing Eye pain, redness, irritation, or discharge with blurry or decreased vision Heart muscle inflammation--unusual weakness or fatigue, shortness of breath, chest pain, fast or irregular heartbeat, dizziness, swelling of the ankles, feet, or hands Hormone gland problems--headache, sensitivity to light, unusual weakness or fatigue, dizziness, fast or irregular heartbeat, increased sensitivity to cold or heat, excessive sweating, constipation, hair loss, increased thirst or amount of urine, tremors or shaking, irritability Infusion reactions--chest pain, shortness of breath or trouble breathing, feeling faint or lightheaded Kidney injury (glomerulonephritis)--decrease in the amount of urine, red or dark brown urine, foamy or bubbly urine, swelling of the ankles, hands, or feet Liver injury--right upper belly pain, loss of appetite, nausea, light-colored stool, dark yellow or brown urine, yellowing skin or eyes, unusual weakness or fatigue Pain, tingling, or numbness in the hands or feet, muscle weakness, change in vision, confusion or trouble speaking, loss of balance or coordination, trouble walking,  seizures Rash, fever, and swollen lymph nodes Redness, blistering, peeling, or loosening of the skin, including inside the mouth Sudden or severe stomach pain, bloody diarrhea, fever, nausea, vomiting Side effects that usually do not require medical attention (report these to your care team if they continue or are bothersome): Bone, joint, or muscle pain Diarrhea Fatigue Loss of appetite Nausea Skin rash This list may not describe all possible side effects. Call your doctor for medical advice about side effects. You may report side effects to FDA at 1-800-FDA-1088. Where should I keep my medication? This medication is given in a hospital or clinic. It will not be stored at home. NOTE: This sheet is a summary. It may not cover all possible information. If you have questions about this medicine, talk to your doctor, pharmacist, or health care provider.  2024 Elsevier/Gold Standard (2022-01-10 00:00:00)  Gemcitabine Injection What is this medication? GEMCITABINE (jem SYE ta been) treats some types of cancer. It works by slowing down the growth of cancer cells. This medicine may be used for other purposes; ask your health care provider or pharmacist if you have questions. COMMON BRAND NAME(S): Gemzar, Infugem What should I tell my care team before I take this medication? They need to know if you have any of these conditions: Blood disorders Infection Kidney disease Liver disease Lung or breathing disease, such as asthma or COPD Recent or ongoing radiation therapy An unusual  or allergic reaction to gemcitabine, other medications, foods, dyes, or preservatives If you or your partner are pregnant or trying to get pregnant Breast-feeding How should I use this medication? This medication is injected into a vein. It is given by your care team in a hospital or clinic setting. Talk to your care team about the use of this medication in children. Special care may be needed. Overdosage: If you  think you have taken too much of this medicine contact a poison control center or emergency room at once. NOTE: This medicine is only for you. Do not share this medicine with others. What if I miss a dose? Keep appointments for follow-up doses. It is important not to miss your dose. Call your care team if you are unable to keep an appointment. What may interact with this medication? Interactions have not been studied. This list may not describe all possible interactions. Give your health care provider a list of all the medicines, herbs, non-prescription drugs, or dietary supplements you use. Also tell them if you smoke, drink alcohol, or use illegal drugs. Some items may interact with your medicine. What should I watch for while using this medication? Your condition will be monitored carefully while you are receiving this medication. This medication may make you feel generally unwell. This is not uncommon, as chemotherapy can affect healthy cells as well as cancer cells. Report any side effects. Continue your course of treatment even though you feel ill unless your care team tells you to stop. In some cases, you may be given additional medications to help with side effects. Follow all directions for their use. This medication may increase your risk of getting an infection. Call your care team for advice if you get a fever, chills, sore throat, or other symptoms of a cold or flu. Do not treat yourself. Try to avoid being around people who are sick. This medication may increase your risk to bruise or bleed. Call your care team if you notice any unusual bleeding. Be careful brushing or flossing your teeth or using a toothpick because you may get an infection or bleed more easily. If you have any dental work done, tell your dentist you are receiving this medication. Avoid taking medications that contain aspirin, acetaminophen, ibuprofen, naproxen, or ketoprofen unless instructed by your care team. These  medications may hide a fever. Talk to your care team if you or your partner wish to become pregnant or think you might be pregnant. This medication can cause serious birth defects if taken during pregnancy and for 6 months after the last dose. A negative pregnancy test is required before starting this medication. A reliable form of contraception is recommended while taking this medication and for 6 months after the last dose. Talk to your care team about effective forms of contraception. Do not father a child while taking this medication and for 3 months after the last dose. Use a condom while having sex during this time period. Do not breastfeed while taking this medication and for at least 1 week after the last dose. This medication may cause infertility. Talk to your care team if you are concerned about your fertility. What side effects may I notice from receiving this medication? Side effects that you should report to your care team as soon as possible: Allergic reactions--skin rash, itching, hives, swelling of the face, lips, tongue, or throat Capillary leak syndrome--stomach or muscle pain, unusual weakness or fatigue, feeling faint or lightheaded, decrease in the amount of urine,  swelling of the ankles, hands, or feet, trouble breathing Infection--fever, chills, cough, sore throat, wounds that don't heal, pain or trouble when passing urine, general feeling of discomfort or being unwell Liver injury--right upper belly pain, loss of appetite, nausea, light-colored stool, dark yellow or brown urine, yellowing skin or eyes, unusual weakness or fatigue Low red blood cell level--unusual weakness or fatigue, dizziness, headache, trouble breathing Lung injury--shortness of breath or trouble breathing, cough, spitting up blood, chest pain, fever Stomach pain, bloody diarrhea, pale skin, unusual weakness or fatigue, decrease in the amount of urine, which may be signs of hemolytic uremic syndrome Sudden and  severe headache, confusion, change in vision, seizures, which may be signs of posterior reversible encephalopathy syndrome (PRES) Unusual bruising or bleeding Side effects that usually do not require medical attention (report to your care team if they continue or are bothersome): Diarrhea Drowsiness Hair loss Nausea Pain, redness, or swelling with sores inside the mouth or throat Vomiting This list may not describe all possible side effects. Call your doctor for medical advice about side effects. You may report side effects to FDA at 1-800-FDA-1088. Where should I keep my medication? This medication is given in a hospital or clinic. It will not be stored at home. NOTE: This sheet is a summary. It may not cover all possible information. If you have questions about this medicine, talk to your doctor, pharmacist, or health care provider.  2024 Elsevier/Gold Standard (2022-01-03 00:00:00)  Cisplatin Injection What is this medication? CISPLATIN (SIS pla tin) treats some types of cancer. It works by slowing down the growth of cancer cells. This medicine may be used for other purposes; ask your health care provider or pharmacist if you have questions. COMMON BRAND NAME(S): Platinol, Platinol -AQ What should I tell my care team before I take this medication? They need to know if you have any of these conditions: Eye disease, vision problems Hearing problems Kidney disease Low blood counts, such as low white cells, platelets, or red blood cells Tingling of the fingers or toes, or other nerve disorder An unusual or allergic reaction to cisplatin, carboplatin, oxaliplatin, other medications, foods, dyes, or preservatives If you or your partner are pregnant or trying to get pregnant Breast-feeding How should I use this medication? This medication is injected into a vein. It is given by your care team in a hospital or clinic setting. Talk to your care team about the use of this medication in  children. Special care may be needed. Overdosage: If you think you have taken too much of this medicine contact a poison control center or emergency room at once. NOTE: This medicine is only for you. Do not share this medicine with others. What if I miss a dose? Keep appointments for follow-up doses. It is important not to miss your dose. Call your care team if you are unable to keep an appointment. What may interact with this medication? Do not take this medication with any of the following: Live virus vaccines This medication may also interact with the following: Certain antibiotics, such as amikacin, gentamicin, neomycin, polymyxin B, streptomycin, tobramycin, vancomycin Foscarnet This list may not describe all possible interactions. Give your health care provider a list of all the medicines, herbs, non-prescription drugs, or dietary supplements you use. Also tell them if you smoke, drink alcohol, or use illegal drugs. Some items may interact with your medicine. What should I watch for while using this medication? Your condition will be monitored carefully while you are receiving  this medication. You may need blood work done while taking this medication. This medication may make you feel generally unwell. This is not uncommon, as chemotherapy can affect healthy cells as well as cancer cells. Report any side effects. Continue your course of treatment even though you feel ill unless your care team tells you to stop. This medication may increase your risk of getting an infection. Call your care team for advice if you get a fever, chills, sore throat, or other symptoms of a cold or flu. Do not treat yourself. Try to avoid being around people who are sick. Avoid taking medications that contain aspirin, acetaminophen, ibuprofen, naproxen, or ketoprofen unless instructed by your care team. These medications may hide a fever. This medication may increase your risk to bruise or bleed. Call your care team  if you notice any unusual bleeding. Be careful brushing or flossing your teeth or using a toothpick because you may get an infection or bleed more easily. If you have any dental work done, tell your dentist you are receiving this medication. Drink fluids as directed while you are taking this medication. This will help protect your kidneys. Call your care team if you get diarrhea. Do not treat yourself. Talk to your care team if you or your partner wish to become pregnant or think you might be pregnant. This medication can cause serious birth defects if taken during pregnancy and for 14 months after the last dose. A negative pregnancy test is required before starting this medication. A reliable form of contraception is recommended while taking this medication and for 14 months after the last dose. Talk to your care team about effective forms of contraception. Do not father a child while taking this medication and for 11 months after the last dose. Use a condom during sex during this time period. Do not breast-feed while taking this medication. This medication may cause infertility. Talk to your care team if you are concerned about your fertility. What side effects may I notice from receiving this medication? Side effects that you should report to your care team as soon as possible: Allergic reactions--skin rash, itching, hives, swelling of the face, lips, tongue, or throat Eye pain, change in vision, vision loss Hearing loss, ringing in ears Infection--fever, chills, cough, sore throat, wounds that don't heal, pain or trouble when passing urine, general feeling of discomfort or being unwell Kidney injury--decrease in the amount of urine, swelling of the ankles, hands, or feet Low red blood cell level--unusual weakness or fatigue, dizziness, headache, trouble breathing Painful swelling, warmth, or redness of the skin, blisters or sores at the infusion site Pain, tingling, or numbness in the hands or  feet Unusual bruising or bleeding Side effects that usually do not require medical attention (report to your care team if they continue or are bothersome): Hair loss Nausea Vomiting This list may not describe all possible side effects. Call your doctor for medical advice about side effects. You may report side effects to FDA at 1-800-FDA-1088. Where should I keep my medication? This medication is given in a hospital or clinic. It will not be stored at home. NOTE: This sheet is a summary. It may not cover all possible information. If you have questions about this medicine, talk to your doctor, pharmacist, or health care provider.  2024 Elsevier/Gold Standard (2021-12-30 00:00:00)         BELOW ARE SYMPTOMS THAT SHOULD BE REPORTED IMMEDIATELY: *FEVER GREATER THAN 100.4 F (38 C) OR HIGHER *CHILLS OR SWEATING *  NAUSEA AND VOMITING THAT IS NOT CONTROLLED WITH YOUR NAUSEA MEDICATION *UNUSUAL SHORTNESS OF BREATH *UNUSUAL BRUISING OR BLEEDING *URINARY PROBLEMS (pain or burning when urinating, or frequent urination) *BOWEL PROBLEMS (unusual diarrhea, constipation, pain near the anus) TENDERNESS IN MOUTH AND THROAT WITH OR WITHOUT PRESENCE OF ULCERS (sore throat, sores in mouth, or a toothache) UNUSUAL RASH, SWELLING OR PAIN  UNUSUAL VAGINAL DISCHARGE OR ITCHING   Items with * indicate a potential emergency and should be followed up as soon as possible or go to the Emergency Department if any problems should occur.  Please show the CHEMOTHERAPY ALERT CARD or IMMUNOTHERAPY ALERT CARD at check-in to the Emergency Department and triage nurse.  Should you have questions after your visit or need to cancel or reschedule your appointment, please contact [facility] [place] - A DEPT OF Eligha Brideg[facility] [phone]  and follow the prompts.  Office hours are 8:00 a.m. to 4:30 p.m. Monday - Friday. Please note that voicemails left after 4:00 p.m. may not be returned until the  following business day.  We are closed weekends and major holidays. You have access to a nurse at all times for urgent questions. Please call the main number to the clinic [phone] and follow the prompts.  For any non-urgent questions, you may also contact your provider using MyChart. We now offer e-Visits for anyone 16 and older to request care online for non-urgent symptoms. For details visit [email or web address].   Also download the MyChart app! Go to the app store, search "MyChart", open the app, select [facility], and log in with your MyChart username and password.

## 2024-06-10 NOTE — Progress Notes (Signed)
 Patient presents today for Imfinzi /Gemzar /Cisplatin  infusion.  Patient is in satisfactory condition with no new complaints voiced.  All vital signs are stable. Patient's HR noted to be 104 today.  Labs reviewed and all labs are within treatment parameters.  We will proceed with treatment per MD orders.    Patient urinated 300 mL prior to Cisplatin  administration.  Treatment given today per MD orders. Tolerated infusion without adverse affects. Vital signs stable. No complaints at this time. Discharged from clinic ambulatory with cane in stable condition. Alert and oriented x 3. F/U with [facility] as scheduled.

## 2024-06-10 NOTE — Progress Notes (Signed)
   06/10/24 1100  Spiritual Encounters  Type of Visit Follow up  Care provided to: Patient  Referral source Chaplain assessment  Reason for visit Routine spiritual support  OnCall Visit No  Spiritual Framework  Presenting Themes Meaning/purpose/sources of inspiration;Goals in life/care;Caregiving needs;Impactful experiences and emotions;Community and relationships;Coping tools  Community/Connection Friend(s);Spiritual leader  Patient Stress Factors Lack of knowledge;Major life changes;Health changes  Family Stress Factors Not reviewed  Interventions  Spiritual Care Interventions Made Compassionate presence;Reflective listening;Narrative/life review;Explored values/beliefs/practices/strengths;Meaning making;Prayer;Encouragement  Intervention Outcomes  Outcomes Reduced anxiety;Awareness of support;Awareness around self/spiritual resourses  Spiritual Care Plan  Spiritual Care Issues Still Outstanding Chaplain will continue to follow   Reason for Visit: Chaplain making scheduled follow-up with Pt I previously connected with. Description of Visit: I entered the room and found Marvin Carr sitting in the chair receiving his treatment, with no support person present.   Marvin Carr and I spent a few minutes catching up and re-establishing the relationship.  I then began to explore how things have been going for [place] recently.  I recognized that he was not as bright and upbeat as when I had previously met with him.  We uncovered some anxiety in him centering on "his prognosis" and his lack of a complete understanding about where he stands in terms of his cancer right now.  He seems to want a more "blunt" report from his provider than the provider tends to give. He also was concerned about having messaged his provider last week and hoping to hear back Monday, but did not get a call.  I saw a space to advocate for the patient here, and I spoke to [facility] regarding the message he sent.  Tomi arranged for the provider to  speak with the Pt.  Other themes from our conversation focused on Marvin Carr's parents and their deaths.  We also explored some religious themes, and I asked probing questions about Marvin Carr's caregiving needs and the resources he has.  For now, he appears to be able to care for his own needs, though it appears that he has at least mentally explored the idea of seeking some outside health care assistance in the home.  Prior to the visit concluding, I offered prayer with Dick and he was appreciative.  Plan of Care: I will continue to follow-up with Dick on a bi-weekly basis.   Maude Roll, MDiv  Chaplain, [facility] [email or web address] [phone]

## 2024-06-11 LAB — T4: T4, Total: 8.6 ug/dL (ref 4.5–12.0)

## 2024-06-11 LAB — CEA: CEA: 18.7 ng/mL — ABNORMAL HIGH (ref 0.0–4.7)

## 2024-06-11 LAB — CANCER ANTIGEN 19-9: CA 19-9: 450 U/mL — ABNORMAL HIGH (ref 0–35)

## 2024-06-13 ENCOUNTER — Encounter (HOSPITAL_COMMUNITY): Payer: Self-pay

## 2024-06-13 ENCOUNTER — Ambulatory Visit (HOSPITAL_COMMUNITY)
Admission: RE | Admit: 2024-06-13 | Discharge: 2024-06-13 | Disposition: A | Discharge status: Home / Self Care | Source: Ambulatory Visit | Attending: Oncology | Admitting: Oncology

## 2024-06-13 DIAGNOSIS — R180 Malignant ascites: Secondary | ICD-10-CM | POA: Insufficient documentation

## 2024-06-13 DIAGNOSIS — Z8505 Personal history of malignant neoplasm of liver: Secondary | ICD-10-CM | POA: Insufficient documentation

## 2024-06-13 NOTE — Progress Notes (Signed)
 Patient tolerated Left sided Paracentesis procedure well today and 2.3 Liters of light amber colored fluid removed. Patient verbalized understanding of discharge instructions and ambulatory at departure with no acute distress noted.

## 2024-06-24 ENCOUNTER — Inpatient Hospital Stay

## 2024-06-24 ENCOUNTER — Inpatient Hospital Stay: Attending: Hematology | Admitting: Oncology

## 2024-06-24 DIAGNOSIS — R970 Elevated carcinoembryonic antigen [CEA]: Secondary | ICD-10-CM | POA: Insufficient documentation

## 2024-06-24 DIAGNOSIS — R180 Malignant ascites: Secondary | ICD-10-CM | POA: Diagnosis not present

## 2024-06-24 DIAGNOSIS — Z95828 Presence of other vascular implants and grafts: Secondary | ICD-10-CM

## 2024-06-24 DIAGNOSIS — D509 Iron deficiency anemia, unspecified: Secondary | ICD-10-CM | POA: Insufficient documentation

## 2024-06-24 DIAGNOSIS — Z96649 Presence of unspecified artificial hip joint: Secondary | ICD-10-CM | POA: Diagnosis not present

## 2024-06-24 DIAGNOSIS — G4700 Insomnia, unspecified: Secondary | ICD-10-CM | POA: Insufficient documentation

## 2024-06-24 DIAGNOSIS — G2581 Restless legs syndrome: Secondary | ICD-10-CM | POA: Insufficient documentation

## 2024-06-24 DIAGNOSIS — Z8551 Personal history of malignant neoplasm of bladder: Secondary | ICD-10-CM | POA: Insufficient documentation

## 2024-06-24 DIAGNOSIS — G629 Polyneuropathy, unspecified: Secondary | ICD-10-CM | POA: Insufficient documentation

## 2024-06-24 DIAGNOSIS — E876 Hypokalemia: Secondary | ICD-10-CM | POA: Diagnosis not present

## 2024-06-24 DIAGNOSIS — Z87891 Personal history of nicotine dependence: Secondary | ICD-10-CM | POA: Diagnosis not present

## 2024-06-24 DIAGNOSIS — C221 Intrahepatic bile duct carcinoma: Secondary | ICD-10-CM | POA: Diagnosis present

## 2024-06-24 DIAGNOSIS — C787 Secondary malignant neoplasm of liver and intrahepatic bile duct: Secondary | ICD-10-CM

## 2024-06-24 DIAGNOSIS — Z79899 Other long term (current) drug therapy: Secondary | ICD-10-CM | POA: Insufficient documentation

## 2024-06-24 DIAGNOSIS — R053 Chronic cough: Secondary | ICD-10-CM | POA: Diagnosis not present

## 2024-06-24 DIAGNOSIS — R058 Other specified cough: Secondary | ICD-10-CM

## 2024-06-24 LAB — CBC WITH DIFFERENTIAL/PLATELET
Abs Immature Granulocytes: 0.05 K/uL (ref 0.00–0.07)
Basophils Absolute: 0 K/uL (ref 0.0–0.1)
Basophils Relative: 0 %
Eosinophils Absolute: 0.1 K/uL (ref 0.0–0.5)
Eosinophils Relative: 1 %
HCT: 33.5 % — ABNORMAL LOW (ref 39.0–52.0)
Hemoglobin: 10.5 g/dL — ABNORMAL LOW (ref 13.0–17.0)
Immature Granulocytes: 1 %
Lymphocytes Relative: 17 %
Lymphs Abs: 1.1 K/uL (ref 0.7–4.0)
MCH: 29.4 pg (ref 26.0–34.0)
MCHC: 31.3 g/dL (ref 30.0–36.0)
MCV: 93.8 fL (ref 80.0–100.0)
Monocytes Absolute: 1.1 K/uL — ABNORMAL HIGH (ref 0.1–1.0)
Monocytes Relative: 16 %
Neutro Abs: 4.4 K/uL (ref 1.7–7.7)
Neutrophils Relative %: 65 %
Platelets: 232 K/uL (ref 150–400)
RBC: 3.57 MIL/uL — ABNORMAL LOW (ref 4.22–5.81)
RDW: 18.6 % — ABNORMAL HIGH (ref 11.5–15.5)
WBC: 6.7 K/uL (ref 4.0–10.5)
nRBC: 0 % (ref 0.0–0.2)

## 2024-06-24 LAB — COMPREHENSIVE METABOLIC PANEL WITH GFR
ALT: 11 U/L (ref 0–44)
AST: 18 U/L (ref 15–41)
Albumin: 3.3 g/dL — ABNORMAL LOW (ref 3.5–5.0)
Alkaline Phosphatase: 109 U/L (ref 38–126)
Anion gap: 9 (ref 5–15)
BUN: 21 mg/dL (ref 8–23)
CO2: 27 mmol/L (ref 22–32)
Calcium: 9.4 mg/dL (ref 8.9–10.3)
Chloride: 98 mmol/L (ref 98–111)
Creatinine, Ser: 1.01 mg/dL (ref 0.61–1.24)
GFR, Estimated: >60 mL/min (ref >60)
Glucose, Bld: 131 mg/dL — ABNORMAL HIGH (ref 70–99)
Potassium: 4.3 mmol/L (ref 3.5–5.1)
Sodium: 134 mmol/L — ABNORMAL LOW (ref 135–145)
Total Bilirubin: 0.2 mg/dL (ref 0.0–1.2)
Total Protein: 6.2 g/dL — ABNORMAL LOW (ref 6.5–8.1)

## 2024-06-24 LAB — MAGNESIUM: Magnesium: 1.9 mg/dL (ref 1.7–2.4)

## 2024-06-24 LAB — IRON AND TIBC
Iron: 55 ug/dL (ref 45–182)
Saturation Ratios: 18 % (ref 17.9–39.5)
TIBC: 301 ug/dL (ref 250–450)
UIBC: 246 ug/dL

## 2024-06-24 LAB — FERRITIN: Ferritin: 590 ng/mL — ABNORMAL HIGH (ref 24–336)

## 2024-06-24 NOTE — Progress Notes (Signed)
 Patient presents today for Genzar/Cisplatin  infusion per providers order.  Vital signs and labs reviewed by MD.  Message received from Isaiah Piety RN/Dr. Davonna treatment being held due to progression.  Will start on FOLFOX next week per MD.

## 2024-06-24 NOTE — Progress Notes (Signed)
 Patient Care Team: [name], MD as PCP - General (Internal Medicine) Debera Jayson MATSU, MD as PCP - Cardiology (Cardiology) Debera Jayson MATSU, MD as Consulting Physician (Cardiology) Onita Duos, MD as Consulting Physician (Neurology) Darlean Ozell NOVAK, MD as Consulting Physician (Pulmonary Disease)  Clinic Day:  06/24/2024  Referring physician: [name], MD   CHIEF COMPLAINT:  CC: Cholangiocarcinoma with Liver lesion/peritoneal carcinomatosis    ASSESSMENT & PLAN:   Assessment & Plan: Marvin Carr  is a 68 y.o. 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 S/p first-line treatment with gemcitabine , cisplatin  and durvalumab  and durvalumab  maintenance CT with progression of disease in 09/2023 and 11/2023.  Restarted patient on gemcitabine  and cisplatin  along with durvalumab  Patient missed chemotherapy of about 2 cycles because of hip replacement Uptrending CA 19-9  - We reviewed the recent MRI findings together.  Clear progression of disease in the liver metastasis with increase in size and also worsening peritoneal carcinomatosis -Discussed next lines of treatment options including treatment with FOLFOX..  Reviewed most common side effects including nausea, vomiting, diarrhea and peripheral neuropathy and cold sensitivity with oxaliplatin. -Will hold treatment today and reschedule for FOLFOX next week. - Educated on managing side effects, advised keeping water  at room temperature to avoid cold sensitivity. - Will Ensure availability of medications for nausea, vomiting, and diarrhea. 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 Potential side effect of new chemotherapy. Risk of exacerbation with treatment.  - Monitor for worsening neuropathy symptoms and report changes.  Restless legs syndrome Well-managed with Mirapex  and iron supplementation. Previous low iron levels may have contributed.  - Continue Mirapex . - Recheck iron levels and supplement if low.  Iron deficiency Previously treated, contributing to improvement in restless legs syndrome.  - Recheck iron levels and supplement if low.  Chronic insomnia Not well-controlled. Trazodone  effective but caused daytime drowsiness.  -Will prescribe trazodone , advised taking earlier in the evening to minimize daytime drowsiness.  Hypomagnesemia Magnesium  level at 1.9. No diarrhea from supplementation. - Continue magnesium  supplementation.  Chronic cough Persistent, dry cough. Tensilon provides minimal relief. Dry, forceful cough occasionally leads to retching.  - Monitor cough symptoms and report changes.  Malignant ascites Malignant ascites previously managed with frequent paracentesis, now with decreased frequency of paracentesis and use of Bumex  for swelling. Last paracentesis was 06/13/2024 with 2.5 L taken out   - Continue Bumex  for fluid management. - Monitor weight and fluid retention.  Hypokalemia Potassium 4.3 today.  Hold oral potassium supplementation at home.   The patient understands the plans discussed today and is in agreement with them.  He knows to contact our office if he develops concerns prior to his next appointment.  The total time spent in the appointment was 40 minutes for the encounter with patient, including review of chart and various tests results, discussions about plan of care and coordination of care plan    Mickiel Dry, MD  [facility] [place] - A DEPT OF [place] [address] [place]  [place] [address] Dept: [phone] Dept Fax: [phone]   No orders of the defined types were placed in this encounter.    ONCOLOGY HISTORY:   I have reviewed his chart and materials related to his cancer extensively and collaborated history with the patient. 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 Around this time the patient intentionally lost 20 pounds in 3-4 months by cutting back on sugar in his diet and was started on semaglutide .  -08/29/2021: CT AP: Ill-defined low-density lesions within the liver measuring up to 6.1 cm. Nodularity throughout the peritoneum/omentum anteriorly in the abdomen and pelvis concerning for peritoneal tumor. -09/01/2021: CEA:34.3. CA 19-9: 9. LDH: 242.  -09/08/2021: Initial PET: Signs of hepatic metastatic disease and diffuse peritoneal carcinomatosis. Gastric antral involvement from peritoneal disease is contiguous with hepatic metastatic disease. Nodal involvement in the upper abdomen. Stranding and nodularity throughout the peritoneum including about the gallbladder is related to peritoneal carcinomatosis. -09/15/2021: Liver biopsy.  -Pathology: Poorly differentiated adenocarcinoma with necrosis. Tumor cells are positive for CK7 and CDX2 (weak, focal) and negative for GATA3.  The findings are suggestive of an upper gastrointestinal or pancreatobiliary primary.  In absence of any other lesions, findings are compatible with a primary cholangiocarcinoma.  -Cytology: Malignant cells consistent with adenocarcinoma.  -NGS: No targetable mutations. PD-L1 (SP142):Negative,0%. 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  -MSI-Stable, p-MMR,TMB:  low-62mut/Mb  -BRAF,NTRK1/2/3,RET,BRCA1/2,HER2,FGFR2/3,IDH1/2,KRAS: Negative -09/21/2021: MRI brain: No acute intracranial pathology or evidence of intracranialmetastatic disease. -09/23/2021: EGD : 2 cm hiatal hernia, congestive gastropathy, and normal duodenum.  Colonoscopy: One 6 mm polyp in the transverse colon. Diverticulosis on the sigmoid colon and descending colon. Normal distal rectum and anal verge.  -10/05/2021 -05/31/2022: Gemcitabine , cisplatin  and durvalumab   -12/20/2021-05/23/2022: CT CAP: Treatment response -07/12/2022- 11/07/2023: Maintenance Single-agent durvalumab   -08/07/2022- 06/01/2023: CT CAP: NED/stable findings -10/02/2023: CT CAP: Increasing, developing areas of subtle peritoneal nodularity worrisome for spread of disease. No significant ascites. Scattered mesenteric stranding. Persistent wall thickening and stones in the gallbladder with slight stranding. -11/29/2023: CT CAP: Increased burden of peritoneal/mesenteric nodularity and new trace perihepatic fluid and increased mesenteric edema, concerning for worsening peritoneal carcinomatosis. -12/07/2023 to current: Gemcitabine , cisplatin  and durvalumab  restarted. Patient missed chemotherapy from 12/2023-03/10/2024 for hip surgery. Restarted cycle 2 on 03/10/2024 -04/01/2024: CA 19-9:133, CEA:11.1 -04/04/2024: CT CAP: Subtle hypodensity in the RIGHT hepatic lobe is indeterminate. Peritoneal pleural fluid and nodularity appears slightly improved. Small amount of fluid along the margin of the liver is slightly increased.  - 04/08/2024: MRI liver: Multiple small metastatic lesions involving both lobes of the liver.  Omental and peritoneal surface disease throughout the upper abdomen, better seen on the CT scan. -04/23/2024: CA 19-9:191, CEA:13.2 -05/13/2024: CA 19-9:293, CEA:13.6 -05/15/2024: Hypodense lesions in the liver correspond to previous ring-enhancing lesions on the MRI from 04/08/2024. The hypodensities are more striking  compared to the 04/04/2024 CT exam, and mildly enlarged compared to the prior MRI. No new lesions identified. Increased ascites, with some faint areas of enhancement along the ascites suspicious for peritoneal carcinomatosis. -06/03/2024: MRI abdomen: Redemonstration of multiple hepatic metastatic lesions, which exhibit interval increase in size, compatible with worsening metastatic disease. No discrete new liver lesion seen.There is mild-to-moderate omental and peritoneal nodularity, compatible with peritoneal carcinomatosis. When compared to the prior exam there is mild interval increase.   Diagnosis: Bladder Cancer  -  04/07/2010: TURBT.  Pathology: Low grade papillary urothelial carcinoma. Cytology of bladder washing was positive for atypical urothelial cells.  -Patient reportedly received 1 treatment of intravesical chemotherapy and has been on surveillance since  Current Treatment:  Gemcitabine , cisplatin  and durvalumab    INTERVAL HISTORY:   Discussed the use of AI scribe software for clinical note transcription with the patient, who gave verbal consent to proceed.  History of Present Illness Marvin Carr is a 68 year old male with stage IV cholangiocarcinoma cancer who presents for follow-up.  He recently underwent paracentesis with the removal of 2.5 liters of fluid from his abdomen.   He experiences numbness and tingling in his hands and feet. His restless leg syndrome has improved, and he continues to take Mirapex  for this condition. He is not currently taking gabapentin  for neuropathy.  He has a persistent dry cough that is less frequent than before but still present. It sometimes leads to a forceful cough, making him feel like he might vomit, although this has only happened once. No sensation of aspiration.  He is taking potassium supplements. He continues to take magnesium  without experiencing diarrhea. For sleep, he takes Lunesta, which is not very effective, and has  tried trazodone , which he found helpful despite some initial side effects.  He lives alone and does not have children. He has cats as pets.   I have reviewed the past medical history, past surgical history, social history and family history with the patient and they are unchanged from previous note.  ALLERGIES:  has no known allergies.  MEDICATIONS:  Current Outpatient Medications  Medication Sig Dispense Refill   acetaminophen  (TYLENOL ) 500 MG tablet Take 2 tablets (1,000 mg total) by mouth every 6 (six) hours. (Patient taking differently: Take 1,000 mg by mouth every 6 (six) hours as needed for mild pain (pain score 1-3).)     benzonatate  (TESSALON ) 200 MG capsule Take 1 capsule (200 mg total) by mouth 3 (three) times daily as needed for cough. 30 capsule 0   bumetanide  (BUMEX ) 2 MG tablet Take 1 tablet (2 mg total) by mouth daily as needed. 90 tablet 2   bumetanide  (BUMEX ) 2 MG tablet Take 1 tablet (2 mg total) by mouth daily as needed. 90 tablet 1   buPROPion  (WELLBUTRIN  XL) 300 MG 24 hr tablet Take 300 mg by mouth daily.     cetirizine  (ZYRTEC ) 10 MG tablet Take 10 mg by mouth daily.     cyclobenzaprine  (FLEXERIL ) 10 MG tablet Take 10 mg by mouth every 6 (six) hours as needed for muscle spasms.     dapagliflozin propanediol (FARXIGA) 10 MG TABS tablet Take 10 mg by mouth daily.     docusate sodium (COLACE) 100 MG capsule Take 200 mg by mouth at bedtime.     Durvalumab  (IMFINZI  IV) Inject into the vein every 21 ( twenty-one) days.     Eszopiclone 3 MG TABS Take 3 mg by mouth at bedtime.     famotidine  (PEPCID ) 20 MG tablet Take 20 mg by mouth at bedtime.     fenofibrate  160 MG tablet Take 160 mg by mouth daily.     FLUoxetine  (PROZAC ) 10 MG capsule Take 10 mg by mouth daily.     guaiFENesin (MUCINEX) 600 MG 12 hr tablet Take 600 mg by mouth 2 (two) times daily as needed for cough or to loosen phlegm.     lidocaine  (XYLOCAINE ) 2 % solution      magic mouthwash (lidocaine ,  diphenhydrAMINE , alum &  mag hydroxide) suspension Swish and swallow 5 mLs 3 (three) times daily as needed for mouth pain. 360 mL 0   magnesium  oxide (MAG-OX) 400 (240 Mg) MG tablet Take 1 tablet (400 mg total) by mouth 2 (two) times daily. 90 tablet 6   metFORMIN  (GLUCOPHAGE -XR) 500 MG 24 hr tablet Take 500 mg by mouth 2 (two) times daily.     Multiple Vitamin (MULTIVITAMIN WITH MINERALS) TABS tablet Take 1 tablet by mouth daily.     nitroGLYCERIN  (NITROSTAT ) 0.4 MG SL tablet Place 0.4 mg under the tongue every 5 (five) minutes x 3 doses as needed for chest pain (if no relief after 2nd dose, proceed to ED or call 911).     olmesartan  (BENICAR ) 40 MG tablet TAKE ONE TABLET BY MOUTH EVERY DAY 90 tablet 2   omeprazole (PRILOSEC) 40 MG capsule Take 40 mg by mouth daily as needed (heartburn, acid reflux).     pramipexole  (MIRAPEX ) 0.75 MG tablet Take 1 tablet (0.75 mg total) by mouth 3 (three) times daily. (Patient taking differently: Take 0.75 mg by mouth in the morning, at noon, in the evening, and at bedtime.) 90 tablet 0   prochlorperazine  (COMPAZINE ) 10 MG tablet Take 1 tablet (10 mg total) by mouth every 6 (six) hours as needed for nausea or vomiting. 30 tablet 2   Semaglutide  14 MG TABS Take 14 mg by mouth daily.     tamsulosin  (FLOMAX ) 0.4 MG CAPS capsule Take 0.4 mg by mouth daily.     traMADol  (ULTRAM ) 50 MG tablet Take 50 mg by mouth at bedtime as needed for severe pain (pain score 7-10).     zaleplon  (SONATA ) 10 MG capsule TAKE ONE CAPSULE BY MOUTH AT BEDTIME AS NEEDED FOR SLEEP (Patient taking differently: Take 10 mg by mouth at bedtime as needed for sleep.) 30 capsule 5   No current facility-administered medications for this visit.   Facility-Administered Medications Ordered in Other Visits  Medication Dose Route Frequency Provider Last Rate Last Admin   magnesium  sulfate 2 GM/50ML IVPB             REVIEW OF SYSTEMS:   Constitutional: Denies fevers, chills or abnormal weight  loss Eyes: Denies blurriness of vision Ears, nose, mouth, throat, and face: Denies mucositis or sore throat Respiratory: Denies cough, dyspnea or wheezes Cardiovascular: Denies palpitation, chest discomfort or lower extremity swelling Gastrointestinal:  Denies nausea, heartburn or change in bowel habits Skin: Denies abnormal skin rashes Lymphatics: Denies new lymphadenopathy or easy bruising Neurological:Denies numbness, tingling or new weaknesses Behavioral/Psych: Mood is stable, no new changes  All other systems were reviewed with the patient and are negative.   VITALS:  There were no vitals taken for this visit.  Wt Readings from Last 3 Encounters:  06/10/24 293 lb 1.6 oz (132.9 kg)  05/27/24 293 lb (132.9 kg)  05/15/24 (!) 307 lb 12.8 oz (139.6 kg)    There is no height or weight on file to calculate BMI.  Performance status (ECOG): 1 - Symptomatic but completely ambulatory  PHYSICAL EXAM:   GENERAL:alert, no distress and comfortable, obese LYMPH:  no palpable lymphadenopathy in the cervical, axillary or inguinal LUNGS: clear to auscultation and percussion with normal breathing effort HEART: regular rate & rhythm and no murmurs and B/L pitting pedal edema upto the knees ABDOMEN:abdomen soft, non-tender and normal bowel sounds Musculoskeletal:no cyanosis of digits and no clubbing  NEURO: alert & oriented x 3 with fluent speech, no focal motor/sensory deficits  LABORATORY DATA:  I have reviewed the data as listed   Lab Results  Component Value Date   WBC 7.6 06/10/2024   NEUTROABS 5.6 06/10/2024   HGB 10.1 (L) 06/10/2024   HCT 32.8 (L) 06/10/2024   MCV 93.4 06/10/2024   PLT 215 06/10/2024     Chemistry      Component Value Date/Time   NA 135 06/10/2024 0756   K 3.8 06/10/2024 0756   CL 97 (L) 06/10/2024 0756   CO2 26 06/10/2024 0756   BUN 21 06/10/2024 0756   CREATININE 0.85 06/10/2024 0756      Component Value Date/Time   CALCIUM  8.6 (L) 06/10/2024 0756    ALKPHOS 97 06/10/2024 0756   AST 20 06/10/2024 0756   ALT 16 06/10/2024 0756   BILITOT 0.6 06/10/2024 0756       Latest Reference Range & Units 04/23/24 12:45  Iron 45 - 182 ug/dL 24 (L)  UIBC ug/dL 648  TIBC 749 - 549 ug/dL 624  Saturation Ratios 17.9 - 39.5 % 6 (L)  Ferritin 24 - 336 ng/mL 84  (L): Data is abnormally low   Latest Reference Range & Units 06/10/24 07:56  CA 19-9 0 - 35 U/mL 450 (H)  CEA 0.0 - 4.7 ng/mL 18.7 (H)  (H): Data is abnormally high   Latest Reference Range & Units 06/10/24 07:56  TSH 0.350 - 4.500 uIU/mL 3.769  Thyroxine (T4) 4.5 - 12.0 ug/dL 8.6   RADIOGRAPHIC STUDIES: I have personally reviewed the radiological images as listed and agreed with the findings in the report.  CLINICAL DATA:  Gallbladder/biliary cancer, monitor.   EXAM: MRI ABDOMEN WITHOUT AND WITH CONTRAST   TECHNIQUE: Multiplanar multisequence MR imaging of the abdomen was performed both before and after the administration of intravenous contrast.   CONTRAST:  10mL GADAVIST  GADOBUTROL  1 MMOL/ML IV SOLN   COMPARISON:  CT scan chest, abdomen and pelvis from 05/15/24 and MRI abdomen from 04/08/2024.   FINDINGS: Lower chest: There is trace left pleural effusion, slightly increased since the prior study. Otherwise, unremarkable MR appearance to the lung bases. No right pleural effusion. No pericardial effusion. Normal heart size.   Hepatobiliary: The liver is normal in size. Noncirrhotic configuration. There is moderate diffuse hepatic steatosis. Redemonstration of multiple (more than 10) mildly T2 hyperintense, rim enhancing metastatic lesions throughout the liver the largest in the subcapsular left hepatic lobe, segment 4A measuring 15 x 18 mm. The lesion previously measured 10 x 11 mm. Other smaller lesions also exhibit interval growth. Findings are compatible with worsening metastatic disease. No discrete new liver lesions seen. No intrahepatic or extrahepatic bile duct  dilatation. No choledocholithiasis. Gallbladder is distended and exhibit moderate volume gallstones. No abnormal wall thickening or pericholecystic fat stranding. No imaging evidence of acute cholecystitis.   Pancreas: No mass, inflammatory changes or other parenchymal abnormality identified. No main pancreatic duct dilation.   Spleen:  Within normal limits in size and appearance. No focal mass.   Adrenals/Urinary Tract: Unremarkable adrenal glands. No hydroureteronephrosis. There are several bilateral simple renal cysts with largest in the left kidney interpolar region measuring up to 1.4 x 1.6 cm. No suspicious in the lesion.   Stomach/Bowel: Visualized portions within the abdomen are unremarkable. No disproportionate dilation of bowel loops. Unremarkable appendix. There are scattered colonic diverticula without diverticulitis.   Vascular/Lymphatic: There is mild-to-moderate omental and peritoneal nodularity, compatible with peritoneal carcinomatosis. When compared to the prior exam there is mild interval increase. There is also interval increase amount of  ascites, which can not be quantified as mild-to-moderate on today's exam. No pathologically enlarged lymph nodes identified. No abdominal aortic aneurysm demonstrated.   Other: None.   Musculoskeletal: No suspicious bone lesions identified.   IMPRESSION: 1. Redemonstration of multiple hepatic metastatic lesions, which exhibit interval increase in size, compatible with worsening metastatic disease. No discrete new liver lesion seen. 2. There is mild-to-moderate omental and peritoneal nodularity, compatible with peritoneal carcinomatosis. When compared to the prior exam there is mild interval increase. There is also interval increase amount of ascites, which can not be quantified as mild-to-moderate on today's exam, compatible with malignant ascites. 3. Cholelithiasis without imaging evidence of acute cholecystitis. 4.  Trace left pleural effusion, slightly increased since the prior study.     Electronically Signed   By: Ree Molt M.D.   On: 06/03/2024 16:28  US  Paracentesis INDICATION: 68 year old male with history of cholangiocarcinoma with malignant ascites. Request made for therapeutic paracentesis.  EXAM: ULTRASOUND GUIDED THERAPEUTIC PARACENTESIS  MEDICATIONS: 10 mL 1% lidocaine   COMPLICATIONS: None immediate.  PROCEDURE: Informed written consent was obtained from the patient after a discussion of the risks, benefits and alternatives to treatment. A timeout was performed prior to the initiation of the procedure.  Initial ultrasound scanning demonstrates a moderate amount of ascites within the left lateral abdominal quadrant. The left lateral abdomen was prepped and draped in the usual sterile fashion. 1% lidocaine  was used for local anesthesia.  Following this, a 19 gauge, 7-cm, Yueh catheter was introduced. An ultrasound image was saved for documentation purposes. The paracentesis was performed. The catheter was removed and a dressing was applied. The patient tolerated the procedure well without immediate post procedural complication.  FINDINGS: A total of approximately 2.3 liters of clear, yellow fluid was removed. Samples were sent to the laboratory as requested by the clinical team.  IMPRESSION: Successful ultrasound-guided paracentesis yielding 2.3 liters of peritoneal fluid.  Performed by: Kacie Matthews PA-C  Electronically Signed   By: Wilkie Lent M.D.   On: 06/13/2024 14:30

## 2024-06-24 NOTE — Patient Instructions (Signed)
 [facility] at [facility] Discharge Instructions   You were seen and examined today by Dr. Davonna.  She reviewed the results of your lab work which are normal/stable.   Your cancer is progressing. We will need to change treatments. You will be on a regimen called FOLFOX. This is given every 2 weeks. There is an ambulatory pump you will wear home for two days then return to the clinic to have the pump removed.   We will proceed with your new treatment next week.    Return as scheduled.    Thank you for choosing [facility] at [facility] to provide your oncology and hematology care.  To afford each patient quality time with our provider, please arrive at least 15 minutes before your scheduled appointment time.   If you have a lab appointment with the Cancer Center please come in thru the Main Entrance and check in at the main information desk.  You need to re-schedule your appointment should you arrive 10 or more minutes late.  We strive to give you quality time with our providers, and arriving late affects you and other patients whose appointments are after yours.  Also, if you no show three or more times for appointments you may be dismissed from the clinic at the providers discretion.     Again, thank you for choosing [facility].  Our hope is that these requests will decrease the amount of time that you wait before being seen by our physicians.       _____________________________________________________________  Should you have questions after your visit to [facility], please contact our office at [phone] and follow the prompts.  Our office hours are 8:00 a.m. and 4:30 p.m. Monday - Friday.  Please note that voicemails left after 4:00 p.m. may not be returned until the following business day.  We are closed weekends and major holidays.  You do have access to a nurse 24-7, just call the main number to the clinic  [phone] and do not press any options, hold on the line and a nurse will answer the phone.    For prescription refill requests, have your pharmacy contact our office and allow 72 hours.    Due to Covid, you will need to wear a mask upon entering the hospital. If you do not have a mask, a mask will be given to you at the Main Entrance upon arrival. For doctor visits, patients may have 1 support person age 69 or older with them. For treatment visits, patients can not have anyone with them due to social distancing guidelines and our immunocompromised population.

## 2024-06-24 NOTE — Progress Notes (Signed)
 DISCONTINUE OFF PATHWAY REGIMEN - Other   [id number]:Cisplatin IV D1,8 + Durvalumab 1,500 mg IV D1 + Gemcitabine IV D1,8 q21 Days for up to 8 Cycles Followed by Durvalumab 1,500 mg IV D1 q28 Days:   Cycles 1 through up to 8: A cycle is every 21 days:     Durvalumab      Gemcitabine      Cisplatin    Cycles 9 and beyond: A cycle is every 28 days:     Durvalumab   **Always confirm dose/schedule in your pharmacy ordering system**  PRIOR TREATMENT: Cisplatin IV D1,8 + Durvalumab 1,500 mg IV D1 + Gemcitabine IV D1,8 q21 Days for up to 8 Cycles Followed by Durvalumab 1,500 mg IV D1 q28 Days  START OFF PATHWAY REGIMEN - Other   [id number] (Leucovorin IV D1 + Fluorouracil IV D1/CIV D1,2 + Oxaliplatin IV D1) q14 Days:   A cycle is every 14 days:     Oxaliplatin      Leucovorin      Fluorouracil      Fluorouracil   **Always confirm dose/schedule in your pharmacy ordering system**  Patient Characteristics: Intent of Therapy: Non-Curative / Palliative Intent, Discussed with Patient

## 2024-06-25 ENCOUNTER — Other Ambulatory Visit: Payer: Self-pay | Admitting: *Deleted

## 2024-06-25 ENCOUNTER — Encounter: Payer: Self-pay | Admitting: Oncology

## 2024-06-25 ENCOUNTER — Other Ambulatory Visit: Payer: Self-pay | Admitting: Oncology

## 2024-06-25 MED ORDER — TRAZODONE HCL 100 MG PO TABS
100.0000 mg | ORAL_TABLET | Freq: Every day | ORAL | 0 refills | Status: DC
Start: 2024-06-25 — End: 2024-08-04

## 2024-07-01 ENCOUNTER — Inpatient Hospital Stay

## 2024-07-01 ENCOUNTER — Other Ambulatory Visit: Payer: Self-pay

## 2024-07-01 VITALS — BP 128/79 | HR 89 | Temp 97.5°F | Resp 18

## 2024-07-01 DIAGNOSIS — R0789 Other chest pain: Secondary | ICD-10-CM | POA: Diagnosis not present

## 2024-07-01 DIAGNOSIS — Z95828 Presence of other vascular implants and grafts: Secondary | ICD-10-CM

## 2024-07-01 DIAGNOSIS — I25110 Atherosclerotic heart disease of native coronary artery with unstable angina pectoris: Secondary | ICD-10-CM | POA: Diagnosis not present

## 2024-07-01 DIAGNOSIS — C221 Intrahepatic bile duct carcinoma: Secondary | ICD-10-CM

## 2024-07-01 LAB — COMPREHENSIVE METABOLIC PANEL WITH GFR
ALT: 10 U/L (ref 0–44)
AST: 20 U/L (ref 15–41)
Albumin: 3.6 g/dL (ref 3.5–5.0)
Alkaline Phosphatase: 106 U/L (ref 38–126)
Anion gap: 11 (ref 5–15)
BUN: 22 mg/dL (ref 8–23)
CO2: 26 mmol/L (ref 22–32)
Calcium: 9.5 mg/dL (ref 8.9–10.3)
Chloride: 97 mmol/L — ABNORMAL LOW (ref 98–111)
Creatinine, Ser: 1.02 mg/dL (ref 0.61–1.24)
GFR, Estimated: >60 mL/min (ref >60)
Glucose, Bld: 122 mg/dL — ABNORMAL HIGH (ref 70–99)
Potassium: 3.9 mmol/L (ref 3.5–5.1)
Sodium: 134 mmol/L — ABNORMAL LOW (ref 135–145)
Total Bilirubin: 0.4 mg/dL (ref 0.0–1.2)
Total Protein: 6.8 g/dL (ref 6.5–8.1)

## 2024-07-01 LAB — CBC WITH DIFFERENTIAL/PLATELET
Abs Immature Granulocytes: 0.06 K/uL (ref 0.00–0.07)
Basophils Absolute: 0 K/uL (ref 0.0–0.1)
Basophils Relative: 1 %
Eosinophils Absolute: 0.1 K/uL (ref 0.0–0.5)
Eosinophils Relative: 1 %
HCT: 36.3 % — ABNORMAL LOW (ref 39.0–52.0)
Hemoglobin: 11.5 g/dL — ABNORMAL LOW (ref 13.0–17.0)
Immature Granulocytes: 1 %
Lymphocytes Relative: 14 %
Lymphs Abs: 0.9 K/uL (ref 0.7–4.0)
MCH: 29.3 pg (ref 26.0–34.0)
MCHC: 31.7 g/dL (ref 30.0–36.0)
MCV: 92.6 fL (ref 80.0–100.0)
Monocytes Absolute: 1.1 K/uL — ABNORMAL HIGH (ref 0.1–1.0)
Monocytes Relative: 17 %
Neutro Abs: 4.4 K/uL (ref 1.7–7.7)
Neutrophils Relative %: 66 %
Platelets: 313 K/uL (ref 150–400)
RBC: 3.92 MIL/uL — ABNORMAL LOW (ref 4.22–5.81)
RDW: 17.4 % — ABNORMAL HIGH (ref 11.5–15.5)
WBC: 6.5 K/uL (ref 4.0–10.5)
nRBC: 0 % (ref 0.0–0.2)

## 2024-07-01 LAB — MAGNESIUM: Magnesium: 1.9 mg/dL (ref 1.7–2.4)

## 2024-07-01 MED ORDER — FLUOROURACIL CHEMO INJECTION 2.5 GM/50ML
400.0000 mg/m2 | Freq: Once | INTRAVENOUS | Status: AC
  Administered 2024-07-01: 1000 mg via INTRAVENOUS
  Filled 2024-07-01: qty 20

## 2024-07-01 MED ORDER — DEXTROSE 5 % IV SOLN: INTRAVENOUS | Status: DC

## 2024-07-01 MED ORDER — PROCHLORPERAZINE MALEATE 10 MG PO TABS: 10.0000 mg | ORAL_TABLET | Freq: Four times a day (QID) | ORAL | 1 refills | Status: DC | PRN

## 2024-07-01 MED ORDER — PALONOSETRON HCL INJECTION 0.25 MG/5ML
0.2500 mg | Freq: Once | INTRAVENOUS | Status: AC
  Administered 2024-07-01: 0.25 mg via INTRAVENOUS
  Filled 2024-07-01: qty 5

## 2024-07-01 MED ORDER — LEUCOVORIN CALCIUM INJECTION 350 MG
400.0000 mg/m2 | Freq: Once | INTRAVENOUS | Status: AC
  Administered 2024-07-01: 1016 mg via INTRAVENOUS
  Filled 2024-07-01: qty 50.8

## 2024-07-01 MED ORDER — DEXAMETHASONE SOD PHOSPHATE PF 10 MG/ML IJ SOLN
10.0000 mg | Freq: Once | INTRAMUSCULAR | Status: AC
  Administered 2024-07-01: 10 mg via INTRAVENOUS

## 2024-07-01 MED ORDER — DEXAMETHASONE 4 MG PO TABS: 8.0000 mg | ORAL_TABLET | Freq: Every day | ORAL | 1 refills | Status: DC

## 2024-07-01 MED ORDER — OXALIPLATIN CHEMO INJECTION 100 MG/20ML
85.0000 mg/m2 | Freq: Once | INTRAVENOUS | Status: AC
  Administered 2024-07-01: 200 mg via INTRAVENOUS
  Filled 2024-07-01: qty 40

## 2024-07-01 MED ORDER — SODIUM CHLORIDE 0.9 % IV SOLN
2400.0000 mg/m2 | INTRAVENOUS | Status: DC
  Administered 2024-07-01: 6100 mg via INTRAVENOUS
  Filled 2024-07-01: qty 122

## 2024-07-01 MED ORDER — ONDANSETRON HCL 8 MG PO TABS: 8.0000 mg | ORAL_TABLET | Freq: Three times a day (TID) | ORAL | 1 refills | Status: DC | PRN

## 2024-07-01 MED ORDER — LIDOCAINE-PRILOCAINE 2.5-2.5 % EX CREA: TOPICAL_CREAM | CUTANEOUS | 3 refills | Status: DC

## 2024-07-01 NOTE — Patient Instructions (Signed)
 [facility] Chemotherapy Teaching   You will see the doctor regularly throughout treatment.  We will obtain blood work from you prior to every treatment and monitor your results to make sure it is safe to give your treatment. The doctor monitors your response to treatment by the way you are feeling, your blood work, and by obtaining scans periodically.  There will be wait times while you are here for treatment.  It will take about 30 minutes to 1 hour for your lab work to result.  Then there will be wait times while pharmacy mixes your medications.    Oxaliplatin (Eloxatin)  About This Drug  Oxaliplatin is used to treat cancer. It is given in the vein (IV).  It takes two hours to infuse.  Possible Side Effects   Bone marrow suppression. This is a decrease in the number of white blood cells, red blood cells, and platelets. This may raise your risk of infection, make you tired and weak (fatigue), and raise your risk of bleeding.   Tiredness   Soreness of the mouth and throat. You may have red areas, white patches, or sores that hurt.   Nausea and vomiting (throwing up)   Diarrhea (loose bowel movements)   Changes in your liver function   Effects on the nerves called peripheral neuropathy. You may feel numbness, tingling, or pain in your hands and feet, and may be worse in cold temperatures. It may be hard for you to button your clothes, open jars, or walk as usual. The effect on the nerves may get worse with more doses of the drug. These effects get better in some people after the drug is stopped but it does not get better in all people  Note: Each of the side effects above was reported in 40% or greater of patients treated with oxaliplatin. Not all possible side effects are included above.   Warnings and Precautions   Allergic reactions, including anaphylaxis, which may be life-threatening are rare but may happen in some patients. Signs of allergic reaction to this  drug may be swelling of the face, feeling like your tongue or throat are swelling, trouble breathing, rash, itching, fever, chills, feeling dizzy, and/or feeling that your heart is beating in a fast or not normal way. If this happens, do not take another dose of this drug. You should get urgent medical treatment.   Inflammation (swelling) of the lungs, which may be life-threatening. You may have a dry cough or trouble breathing.   Effects on the nerves (neuropathy) may resolve within 14 days, or it may persist beyond 14 days.   Severe decrease in white blood cells when combined with the chemotherapy agents 5-fluorouracil and leucovorin. This may be life-threatening.   Severe changes in your liver function   Abnormal heart beat and/or EKG, which can be life-threatening   Rhabdomyolysis- damage to your muscles which may release proteins in your blood and affect how your kidneys work, which can be life-threatening. You may have severe muscle weakness and/or pain, or dark urine.  Important Information   This drug may impair your ability to drive or use machinery. Talk to your doctor and/or nurse about precautions you may need to take.   This drug may be present in the saliva, tears, sweat, urine, stool, vomit, semen, and vaginal secretions. Talk to your doctor and/or your nurse about the necessary precautions to take during this time.  * The effects on the nerves can be aggravated by exposure to  cold. Avoid cold beverages, use of ice and make sure you cover your skin and dress warmly prior to being exposed to cold temperatures while you are receiving treatment with oxaliplatin*   Treating Side Effects   Manage tiredness by pacing your activities for the day.   Be sure to include periods of rest between energy-draining activities.   To decrease the risk of infection, wash your hands regularly.   Avoid close contact with people who have a cold, the flu, or other infections.  Take your  temperature as your doctor or nurse tells you, and whenever you feel like you may have a fever.   To help decrease the risk of bleeding, use a soft toothbrush. Check with your nurse before using dental floss.   Be very careful when using knives or tools.   Use an electric shaver instead of a razor.   Drink plenty of fluids (a minimum of eight glasses per day is recommended).   Mouth care is very important. Your mouth care should consist of routine, gentle cleaning of your teeth or dentures and rinsing your mouth with a mixture of 1/2 teaspoon of salt in 8 ounces of water  or 1/2 teaspoon of baking soda in 8 ounces of water . This should be done at least after each meal and at bedtime.   If you have mouth sores, avoid mouthwash that has alcohol. Also avoid alcohol and smoking because they can bother your mouth and throat.   To help with nausea and vomiting, eat small, frequent meals instead of three large meals a day. Choose foods and drinks that are at room temperature. Ask your nurse or doctor about other helpful tips and medicine that is available to help stop or lessen these symptoms.   If you throw up or have loose bowel movements, you should drink more fluids so that you do not become dehydrated (lack of water  in the body from losing too much fluid).   If you have diarrhea, eat low-fiber foods that are high in protein and calories and avoid foods that can irritate your digestive tracts or lead to cramping.   Ask your nurse or doctor about medicine that can lessen or stop your diarrhea.   If you have numbness and tingling in your hands and feet, be careful when cooking, walking, and handling sharp objects and hot liquids.   Do not drink cold drinks or use ice in beverages. Drink fluids at room temperature or warmer, and drink through a straw.   Wear gloves to touch cold objects, and wear warm clothing and cover you skin during cold weather.   Food and Drug Interactions   There are no  known interactions of oxaliplatin with food and other medications.   This drug may interact with other medicines. Tell your doctor and pharmacist about all the prescription and over-the-counter medicines and dietary supplements (vitamins, minerals, herbs and others) that you are taking at this time. Also, check with your doctor or pharmacist before starting any new prescription or over-the-counter medicines, or dietary supplements to make sure that there are no interactions   When to Call the Doctor  Call your doctor or nurse if you have any of these symptoms and/or any new or unusual symptoms:   Fever of 100.4 F (38 C) or higher   Chills   Tiredness that interferes with your daily activities   Feeling dizzy or lightheaded   Easy bleeding or bruising   Feeling that your heart is beating in a  fast or not normal way (palpitations)   Pain in your chest   Dry cough   Trouble breathing   Pain in your mouth or throat that makes it hard to eat or drink   Nausea that stops you from eating or drinking and/or is not relieved by prescribed medicines   Throwing up   Diarrhea, 4 times in one day or diarrhea with lack of strength or a feeling of being dizzy   Numbness, tingling, or pain in your hands and feet   Signs of possible liver problems: dark urine, pale bowel movements, bad stomach pain, feeling very tired and weak, unusual itching, or yellowing of the eyes or skin   Signs of rhabdomyolysis: decreased urine, very dark urine, muscle pain in the shoulders, thighs, or lower back; muscle weakness or trouble moving arms and legs   Signs of allergic reaction: swelling of the face, feeling like your tongue or throat are swelling, trouble breathing, rash, itching, fever, chills, feeling dizzy, and/or feeling that your heart is beating in a fast or not normal way. If this happens, call 911 for emergency care.   If you think you may be pregnant  Reproduction Warnings   Pregnancy  warning: This drug may have harmful effects on the unborn baby. Women of childbearing potential should use effective methods of birth control during your cancer treatment. Let your doctor know right away if you think you may be pregnant or may have impregnated your partner.   Breastfeeding warning: It is not known if this drug passes into breast milk. For this reason, women should talk to their doctor about the risks and benefits of breastfeeding during treatment with this drug because this drug may enter the breast milk and cause harm to a breastfeeding baby.   Fertility warning: Human fertility studies have not been done with this drug. Talk with your doctor or nurse if you plan to have children. Ask for information on sperm or egg banking.   Leucovorin Calcium   About This Drug  Leucovorin is a vitamin. It is used in combination with other cancer fighting drugs such as 5-fluorouracil and methotrexate. Leucovorin is given in the vein (IV).  This drug runs at the same time as the oxaliplatin and takes 2 hours to infuse.   Possible Side Effects  Rash and itching  Note: Leucovorin by itself has very few side effects. Other side effects you may have can be caused by the other drugs you are taking, such as 5-fluorouracil.   Warnings and Precautions   Allergic reactions, including anaphylaxis are rare but may happen in some patients. Signs of allergic reaction to this drug may be swelling of the face, feeling like your tongue or throat are swelling, trouble breathing, rash, itching, fever, chills, feeling dizzy, and/or feeling that your heart is beating in a fast or not normal way. If this happens, do not take another dose of this drug. You should get urgent medical treatment.  Food and Drug Interactions   There are no known interactions of leucovorin with food.   This drug may interact with other medicines. Tell your doctor and pharmacist about all the prescription and over-the-counter  medicines and dietary supplements (vitamins, minerals, herbs and others) that you are taking at this time.   Also, check with your doctor or pharmacist before starting any new prescription or over-the-counter medicines, or dietary supplements to make sure that there are no interactions.   When to Call the Doctor  Call your doctor or  nurse if you have any of these symptoms and/or any new or unusual symptoms:   A new rash or a rash that is not relieved by prescribed medicines   Signs of allergic reaction: swelling of the face, feeling like your tongue or throat are swelling, trouble breathing, rash, itching, fever, chills, feeling dizzy, and/or feeling that your heart is beating in a fast or not normal way. If this happens, call 911 for emergency care.   If you think you may be pregnant   Reproduction Warnings   Pregnancy warning: It is not known if this drug may harm an unborn child. For this reason, be sure to talk with your doctor if you are pregnant or planning to become pregnant while receiving this drug. Let your doctor know right away if you think you may be pregnant   Breastfeeding warning: It is not known if this drug passes into breast milk. For this reason, women should talk to their doctor about the risks and benefits of breastfeeding during treatment with this drug because this drug may enter the breast milk and cause harm to a breastfeeding baby.   Fertility warning: Human fertility studies have not been done with this drug. Talk with your doctor or nurse if you plan to have children. Ask for information on sperm or egg banking.   5-Fluorouracil (Adrucil; 5FU)  About This Drug  Fluorouracil is used to treat cancer. It is given in the vein (IV). It is given as an IV push from a syringe and also as a continuous infusion given via an ambulatory pump (a pump you take home and wear for a specified amount of time).  Possible Side Effects   Bone marrow suppression. This is a  decrease in the number of white blood cells, red blood cells, and platelets. This may raise your risk of infection, make you tired and weak (fatigue), and raise your risk of bleeding   Changes in the tissue of the heart and/or heart attack. Some changes may happen that can cause your heart to have less ability to pump blood.   Blurred vision or other changes in eyesight   Nausea and throwing up (vomiting)   Diarrhea (loose bowel movements)   Ulcers - sores that may cause pain or bleeding in your digestive tract, which includes your mouth, esophagus, stomach, small/large intestines and rectum   Soreness of the mouth and throat. You may have red areas, white patches, or sores that hurt.   Allergic reactions, including anaphylaxis are rare but may happen in some patients. Signs of allergic reaction to this drug may be swelling of the face, feeling like your tongue or throat are swelling, trouble breathing, rash, itching, fever, chills, feeling dizzy, and/or feeling that your heart is beating in a fast or not normal way. If this happens, do not take another dose of this drug. You should get urgent medical treatment.   Sensitivity to light (photosensitivity). Photosensitivity means that you may become more sensitive to the sun and/or light. You may get a skin rash/reaction if you are in the sun or are exposed to sun lamps and tanning beds. Your eyes may water  more, mostly in bright light.   Changes in your nail color, nail loss and/or brittle nail   Darkening of the skin, or changes to the color of your skin and/or veins used for infusion   Rash, dry skin, or itching  Note: Not all possible side effects are included above.  Warnings and Precautions  Hand-and-foot syndrome. The palms of your hands or soles of your feet may tingle, become numb, painful, swollen, or red.   Changes in your central nervous system can happen. The central nervous system is made up of your brain and spinal cord.  You could feel extreme tiredness, agitation, confusion, hallucinations (see or hear things that are not there), trouble understanding or speaking, loss of control of your bowels or bladder, eyesight changes, numbness or lack of strength to your arms, legs, face, or body, or coma. If you start to have any of these symptoms let your doctor know right away.   Side effects of this drug may be unexpectedly severe in some patients  Note: Some of the side effects above are very rare. If you have concerns and/or questions, please discuss them with your medical team.   Important Information   This drug may be present in the saliva, tears, sweat, urine, stool, vomit, semen, and vaginal secretions. Talk to your doctor and/or your nurse about the necessary precautions to take during this time.   Treating Side Effects   Manage tiredness by pacing your activities for the day.   Be sure to include periods of rest between energy-draining activities.   To help decrease the risk of infections, wash your hands regularly.   Avoid close contact with people who have a cold, the flu, or other infections.   Take your temperature as your doctor or nurse tells you, and whenever you feel like you may have a fever.   Use a soft toothbrush. Check with your nurse before using dental floss.   Be very careful when using knives or tools.   Use an electric shaver instead of a razor.   If you have a nose bleed, sit with your head tipped slightly forward. Apply pressure by lightly pinching the bridge of your nose between your thumb and forefinger. Call your doctor if you feel dizzy or faint or if the bleeding doesn't stop after 10 to 15 minutes.   Drink plenty of fluids (a minimum of eight glasses per day is recommended).   If you throw up or have loose bowel movements, you should drink more fluids so that you do not  become dehydrated (lack of water  in the body from losing too much fluid).   To help with nausea  and vomiting, eat small, frequent meals instead of three large meals a day. Choose foods and drinks that are at room temperature. Ask your nurse or doctor about other helpful tips and medicine that is available to help, stop, or lessen these symptoms.   If you have diarrhea, eat low-fiber foods that are high in protein and calories and avoid foods that can irritate your digestive tracts or lead to cramping.   Ask your nurse or doctor about medicine that can lessen or stop your diarrhea.   Mouth care is very important. Your mouth care should consist of routine, gentle cleaning of your teeth or dentures and rinsing your mouth with a mixture of 1/2 teaspoon of salt in 8 ounces of water  or 1/2 teaspoon of baking soda in 8 ounces of water . This should be done at least after each meal and at bedtime.   If you have mouth sores, avoid mouthwash that has alcohol. Also avoid alcohol and smoking because they can bother your mouth and throat.   Keeping your nails moisturized may help with brittleness.   To help with itching, moisturize your skin several times day.   Use sunscreen  with SPF 30 or higher when you are outdoors even for a short time. Cover up when you are out in the sun. Wear wide-brimmed hats, long-sleeved shirts, and pants. Keep your neck, chest, and back covered. Wear dark sun glasses when in the sun or bright lights.   If you get a rash do not put anything on it unless your doctor or nurse says you may. Keep the area around the rash clean and dry. Ask your doctor for medicine if your rash bothers you.   Keeping your pain under control is important to your well-being. Please tell your doctor or nurse if you are experiencing pain.   Food and Drug Interactions   There are no known interactions of fluorouracil with food.   Check with your doctor or pharmacist about all other prescription medicines and over-the-counter medicines and dietary supplements (vitamins, minerals, herbs and others)  you are taking before starting this medicine as there are known drug interactions with 5-fluoroucacil. Also, check with your doctor or pharmacist before starting any new prescription or over-the-counter medicines, or dietary supplements to make sure that there are no interactions.  When to Call the Doctor  Call your doctor or nurse if you have any of these symptoms and/or any new or unusual symptoms:   Fever of 100.4 F (38 C) or higher   Chills   Easy bleeding or bruising   Nose bleed that doesn't stop bleeding after 10-15 minutes   Trouble breathing   Feeling dizzy or lightheaded   Feeling that your heart is beating in a fast or not normal way (palpitations)   Chest pain or symptoms of a heart attack. Most heart attacks involve pain in the center of the chest that lasts more than a few minutes. The pain may go away and come back or it can be constant. It can feel like pressure, squeezing, fullness, or pain. Sometimes pain is felt in one or both arms, the back, neck, jaw, or stomach. If any of these symptoms last 2 minutes, call 911.   Confusion and/or agitation   Hallucinations   Trouble understanding or speaking   Loss of control of bowels or bladder   Blurry vision or changes in your eyesight   Headache that does not go away   Numbness or lack of strength to your arms, legs, face, or body   Nausea that stops you from eating or drinking and/or is not relieved by prescribed medicines   Throwing up more than 3 times a day   Diarrhea, 4 times in one day or diarrhea with lack of strength or a feeling of being dizzy   Pain in your mouth or throat that makes it hard to eat or drink   Pain along the digestive tract - especially if worse after eating   Blood in your vomit (bright red or coffee-ground) and/or stools (bright red, or black/tarry)   Coughing up blood   Tiredness that interferes with your daily activities   Painful, red, or swollen areas on your hands or  feet or around your nails   A new rash or a rash that is not relieved by prescribed medicines   Develop sensitivity to sunlight/light   Numbness and/or tingling of your hands and/or feet   Signs of allergic reaction: swelling of the face, feeling like your tongue or throat are swelling, trouble breathing, rash, itching, fever, chills, feeling dizzy, and/or feeling that your heart is beating in a fast or not normal way. If this  happens, call 911 for emergency care.   If you think you are pregnant or may have impregnated your partner  Reproduction Warnings   Pregnancy warning: This drug may have harmful effects on the unborn baby. Women of child bearing potential should use effective methods of birth control during your cancer treatment and 3 months after treatment. Men with male partners of childbearing potential should use effective methods of birth control during your cancer treatment and for 3 months after your cancer treatment. Let your doctor know right away if you think you may be pregnant or may have impregnated your partner.   Breastfeeding warning: It is not known if this drug passes into breast milk. For this reason, Women should not breastfeed during treatment because this drug could enter the breast milk and cause harm to a breastfeeding baby.   Fertility warning: In men and women both, this drug may affect your ability to have children in the future. Talk with your doctor or nurse if you plan to have children. Ask for information on sperm or egg banking.   SELF CARE ACTIVITIES WHILE RECEIVING CHEMOTHERAPY:  Hydration Increase your fluid intake 48 hours prior to treatment and drink at least 8 to 12 cups (64 ounces) of water /decaffeinated beverages per day after treatment. You can still have your cup of coffee or soda but these beverages do not count as part of your 8 to 12 cups that you need to drink daily. No alcohol intake.  Medications Continue taking your normal  prescription medication as prescribed.  If you start any new herbal or new supplements please let us  know first to make sure it is safe.  Mouth Care Have teeth cleaned professionally before starting treatment. Keep dentures and partial plates clean. Use soft toothbrush and do not use mouthwashes that contain alcohol. Biotene is a good mouthwash that is available at most pharmacies or may be ordered by calling [phone]. Use warm salt water  gargles (1 teaspoon salt per 1 quart warm water ) before and after meals and at bedtime. If you need dental work, please let the doctor know before you go for your appointment so that we can coordinate the best possible time for you in regards to your chemo regimen. You need to also let your dentist know that you are actively taking chemo. We may need to do labs prior to your dental appointment.  Skin Care Always use sunscreen that has not expired and with SPF (Sun Protection Factor) of 50 or higher. Wear hats to protect your head from the sun. Remember to use sunscreen on your hands, ears, face, & feet.  Use good moisturizing lotions such as udder cream, eucerin, or even Vaseline. Some chemotherapies can cause dry skin, color changes in your skin and nails.    Avoid long, hot showers or baths. Use gentle, fragrance-free soaps and laundry detergent. Use moisturizers, preferably creams or ointments rather than lotions because the thicker consistency is better at preventing skin dehydration. Apply the cream or ointment within 15 minutes of showering. Reapply moisturizer at night, and moisturize your hands every time after you wash them.  Hair Loss (if your doctor says your hair will fall out)  If your doctor says that your hair is likely to fall out, decide before you begin chemo whether you want to wear a wig. You may want to shop before treatment to match your hair color. Hats, turbans, and scarves can also camouflage hair loss, although some people prefer to  leave their heads  uncovered. If you go bare-headed outdoors, be sure to use sunscreen on your scalp. Cut your hair short. It eases the inconvenience of shedding lots of hair, but it also can reduce the emotional impact of watching your hair fall out. Don't perm or color your hair during chemotherapy. Those chemical treatments are already damaging to hair and can enhance hair loss. Once your chemo treatments are done and your hair has grown back, it's OK to resume dyeing or perming hair.  With chemotherapy, hair loss is almost always temporary. But when it grows back, it may be a different color or texture. In older adults who still had hair color before chemotherapy, the new growth may be completely gray.  Often, new hair is very fine and soft.  Infection Prevention Please wash your hands for at least 30 seconds using warm soapy water . Handwashing is the #1 way to prevent the spread of germs. Stay away from sick people or people who are getting over a cold. If you develop respiratory systems such as green/yellow mucus production or productive cough or persistent cough let us  know and we will see if you need an antibiotic. It is a good idea to keep a pair of gloves on when going into grocery stores/[employer] to decrease your risk of coming into contact with germs on the carts, etc. Carry alcohol hand gel with you at all times and use it frequently if out in public. If your temperature reaches 100.5 or higher please call the clinic and let us  know.  If it is after hours or on the weekend please go to the ER if your temperature is over 100.5.  Please have your own personal thermometer at home to use.    Sex and bodily fluids If you are going to have sex, a condom must be used to protect the person that isn't taking chemotherapy. Chemo can decrease your libido (sex drive). For a few days after chemotherapy, chemotherapy can be excreted through your bodily fluids.  When using the toilet please close the lid and  flush the toilet twice.  Do this for a few day after you have had chemotherapy.   Effects of chemotherapy on your sex life Some changes are simple and won't last long. They won't affect your sex life permanently.  Sometimes you may feel: too tired not strong enough to be very active sick or sore  not in the mood anxious or low Your anxiety might not seem related to sex. For example, you may be worried about the cancer and how your treatment is going. Or you may be worried about money, or about how you family are coping with your illness.  These things can cause stress, which can affect your interest in sex. It's important to talk to your partner about how you feel.  Remember - the changes to your sex life don't usually last long. There's usually no medical reason to stop having sex during chemo. The drugs won't have any long term physical effects on your performance or enjoyment of sex. Cancer can't be passed on to your partner during sex  Contraception It's important to use reliable contraception during treatment. Avoid getting pregnant while you or your partner are having chemotherapy. This is because the drugs may harm the baby. Sometimes chemotherapy drugs can leave a man or woman infertile.  This means you would not be able to have children in the future. You might want to talk to someone about permanent infertility. It can be very difficult  to learn that you may no longer be able to have children. Some people find counselling helpful. There might be ways to preserve your fertility, although this is easier for men than for women. You may want to speak to a fertility expert. You can talk about sperm banking or harvesting your eggs. You can also ask about other fertility options, such as donor eggs. If you have or have had breast cancer, your doctor might advise you not to take the contraceptive pill. This is because the hormones in it might affect the cancer. It is not known for sure whether or not  chemotherapy drugs can be passed on through semen or secretions from the vagina. Because of this some doctors advise people to use a barrier method if you have sex during treatment. This applies to vaginal, anal or oral sex. Generally, doctors advise a barrier method only for the time you are actually having the treatment and for about a week after your treatment. Advice like this can be worrying, but this does not mean that you have to avoid being intimate with your partner. You can still have close contact with your partner and continue to enjoy sex.  Animals If you have cats or birds we just ask that you not change the litter or change the cage.  Please have someone else do this for you while you are on chemotherapy.   Food Safety During and After Cancer Treatment Food safety is important for people both during and after cancer treatment. Cancer and cancer treatments, such as chemotherapy, radiation therapy, and stem cell/bone marrow transplantation, often weaken the immune system. This makes it harder for your body to protect itself from foodborne illness, also called food poisoning. Foodborne illness is caused by eating food that contains harmful bacteria, parasites, or viruses.  Foods to avoid Some foods have a higher risk of becoming tainted with bacteria. These include: Unwashed fresh fruit and vegetables, especially leafy vegetables that can hide dirt and other contaminants Raw sprouts, such as alfalfa sprouts Raw or undercooked beef, especially ground beef, or other raw or undercooked meat and poultry Fatty, fried, or spicy foods immediately before or after treatment.  These can sit heavy on your stomach and make you feel nauseous. Raw or undercooked shellfish, such as oysters. Sushi and sashimi, which often contain raw fish.  Unpasteurized beverages, such as unpasteurized fruit juices, raw milk, raw yogurt, or cider Undercooked eggs, such as soft boiled, over easy, and poached; raw,  unpasteurized eggs; or foods made with raw egg, such as homemade raw cookie dough and homemade mayonnaise  Simple steps for food safety  Shop smart. Do not buy food stored or displayed in an unclean area. Do not buy bruised or damaged fruits or vegetables. Do not buy cans that have cracks, dents, or bulges. Pick up foods that can spoil at the end of your shopping trip and store them in a cooler on the way home.  Prepare and clean up foods carefully. Rinse all fresh fruits and vegetables under running water , and dry them with a clean towel or paper towel. Clean the top of cans before opening them. After preparing food, wash your hands for 20 seconds with hot water  and soap. Pay special attention to areas between fingers and under nails. Clean your utensils and dishes with hot water  and soap. Disinfect your kitchen and cutting boards using 1 teaspoon of liquid, unscented bleach mixed into 1 quart of water .    Dispose of old food. Eat  canned and packaged food before its expiration date (the "use by" or "best before" date). Consume refrigerated leftovers within 3 to 4 days. After that time, throw out the food. Even if the food does not smell or look spoiled, it still may be unsafe. Some bacteria, such as Listeria, can grow even on foods stored in the refrigerator if they are kept for too long.  Take precautions when eating out. At restaurants, avoid buffets and salad bars where food sits out for a long time and comes in contact with many people. Food can become contaminated when someone with a virus, often a norovirus, or another "bug" handles it. Put any leftover food in a "to-go" container yourself, rather than having the server do it. And, refrigerate leftovers as soon as you get home. Choose restaurants that are clean and that are willing to prepare your food as you order it cooked.   AT HOME MEDICATIONS:                                                                                                                                                                 Compazine /Prochlorperazine  10mg  tablet. Take 1 tablet every 6 hours as needed for nausea/vomiting. (This can make you sleepy)   EMLA  cream. Apply a quarter size amount to port site 1 hour prior to chemo. Do not rub in. Cover with plastic wrap.    Diarrhea Sheet   If you are having loose stools/diarrhea, please purchase Imodium and begin taking as outlined:  At the first sign of poorly formed or loose stools you should begin taking Imodium (loperamide) 2 mg capsules.  Take two tablets (4mg ) followed by one tablet (2mg ) every 2 hours - DO NOT EXCEED 8 tablets in 24 hours.  If it is bedtime and you are having loose stools, take 2 tablets at bedtime, then 2 tablets every 4 hours until morning.   Always call the Cancer Center if you are having loose stools/diarrhea that you can't get under control.  Loose stools/diarrhea leads to dehydration (loss of water ) in your body.  We have other options of trying to get the loose stools/diarrhea to stop but you must let us  know!   Constipation Sheet  Colace - 100 mg capsules - take 2 capsules daily.  If this doesn't help then you can increase to 2 capsules twice daily.  Please call if the above does not work for you. Do not go more than 2 days without a bowel movement.  It is very important that you do not become constipated.  It will make you feel sick to your stomach (nausea) and can cause abdominal pain and vomiting.  Nausea Sheet   Compazine /Prochlorperazine  10mg  tablet. Take 1 tablet every 6 hours as needed for nausea/vomiting (This can  make you drowsy).  If you are having persistent nausea (nausea that does not stop) please call the Cancer Center and let us  know the amount of nausea that you are experiencing.  If you begin to vomit, you need to call the Cancer Center and if it is the weekend and you have vomited more than one time and can't get it to stop-go to the Emergency  Room.  Persistent nausea/vomiting can lead to dehydration (loss of fluid in your body) and will make you feel very weak and unwell. Ice chips, sips of clear liquids, foods that are at room temperature, crackers, and toast tend to be better tolerated.   SYMPTOMS TO REPORT AS SOON AS POSSIBLE AFTER TREATMENT:  FEVER GREATER THAN 100.5 F  CHILLS WITH OR WITHOUT FEVER  NAUSEA AND VOMITING THAT IS NOT CONTROLLED WITH YOUR NAUSEA MEDICATION  UNUSUAL SHORTNESS OF BREATH  UNUSUAL BRUISING OR BLEEDING  TENDERNESS IN MOUTH AND THROAT WITH OR WITHOUT   PRESENCE OF ULCERS  URINARY PROBLEMS  BOWEL PROBLEMS  UNUSUAL RASH      Wear comfortable clothing and clothing appropriate for easy access to any Portacath or PICC line. Let us  know if there is anything that we can do to make your therapy better!    What to do if you need assistance after hours or on the weekends: CALL [phone].  HOLD on the line, do not hang up.  You will hear multiple messages but at the end you will be connected with a nurse triage line.  They will contact the doctor if necessary.  Most of the time they will be able to assist you.  Do not call the hospital operator.      I have been informed and understand all of the instructions given to me and have received a copy. I have been instructed to call the clinic [phone] or my family physician as soon as possible for continued medical care, if indicated. I do not have any more questions at this time but understand that I may call the Cancer Center or the Patient Navigator at [phone] during office hours should I have questions or need assistance in obtaining follow-up care.

## 2024-07-01 NOTE — Progress Notes (Signed)
 Pharmacist Chemotherapy Monitoring - Initial Assessment    Anticipated start date: 07/01/24   The following has been reviewed per standard work regarding the patient's treatment regimen: The patient's diagnosis, treatment plan and drug doses, and organ/hematologic function Lab orders and baseline tests specific to treatment regimen  The treatment plan start date, drug sequencing, and pre-medications Prior authorization status  Patient's documented medication list, including drug-drug interaction screen and prescriptions for anti-emetics and supportive care specific to the treatment regimen The drug concentrations, fluid compatibility, administration routes, and timing of the medications to be used The patient's access for treatment and lifetime cumulative dose history, if applicable  The patient's medication allergies and previous infusion related reactions, if applicable   Changes made to treatment plan:  magnesium  lab ordered to monitor Mag with chemotherapy  Follow up needed:  N/A   Marvin Carr, [facility], 07/01/2024  7:51 AM

## 2024-07-01 NOTE — Progress Notes (Signed)
 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  Patient is in satisfactory condition with no new complaints voiced.  Vital signs are stable.  Labs reviewed and all labs are within treatment parameters.  We will proceed with treatment per MD orders.    Treatment given today per MD orders. Tolerated infusion without adverse affects. Vital signs stable. No complaints at this time. Discharged from clinic ambulatory with cane in stable condition. Alert and oriented x 3. F/U with [facility] as scheduled. 5FU ambulatory pump infusing with no alarms beeping.

## 2024-07-02 ENCOUNTER — Ambulatory Visit: Payer: Medicare Other | Admitting: Family Medicine

## 2024-07-02 ENCOUNTER — Encounter (HOSPITAL_COMMUNITY): Payer: Self-pay

## 2024-07-02 ENCOUNTER — Inpatient Hospital Stay (HOSPITAL_COMMUNITY)
Admission: EM | Admit: 2024-07-02 | Discharge: 2024-07-07 | DRG: 322 | Disposition: A | Discharge status: Home / Self Care

## 2024-07-02 ENCOUNTER — Encounter: Payer: Self-pay | Admitting: Oncology

## 2024-07-02 ENCOUNTER — Other Ambulatory Visit: Payer: Self-pay

## 2024-07-02 DIAGNOSIS — Z7982 Long term (current) use of aspirin: Secondary | ICD-10-CM

## 2024-07-02 DIAGNOSIS — F419 Anxiety disorder, unspecified: Secondary | ICD-10-CM | POA: Diagnosis present

## 2024-07-02 DIAGNOSIS — F32A Depression, unspecified: Secondary | ICD-10-CM | POA: Diagnosis present

## 2024-07-02 DIAGNOSIS — I214 Non-ST elevation (NSTEMI) myocardial infarction: Principal | ICD-10-CM

## 2024-07-02 DIAGNOSIS — I10 Essential (primary) hypertension: Secondary | ICD-10-CM | POA: Diagnosis present

## 2024-07-02 DIAGNOSIS — E782 Mixed hyperlipidemia: Secondary | ICD-10-CM | POA: Diagnosis present

## 2024-07-02 DIAGNOSIS — I25110 Atherosclerotic heart disease of native coronary artery with unstable angina pectoris: Principal | ICD-10-CM | POA: Diagnosis present

## 2024-07-02 DIAGNOSIS — C221 Intrahepatic bile duct carcinoma: Secondary | ICD-10-CM | POA: Diagnosis present

## 2024-07-02 DIAGNOSIS — Z79899 Other long term (current) drug therapy: Secondary | ICD-10-CM

## 2024-07-02 DIAGNOSIS — G2581 Restless legs syndrome: Secondary | ICD-10-CM | POA: Diagnosis present

## 2024-07-02 DIAGNOSIS — Z96642 Presence of left artificial hip joint: Secondary | ICD-10-CM | POA: Diagnosis present

## 2024-07-02 DIAGNOSIS — E1140 Type 2 diabetes mellitus with diabetic neuropathy, unspecified: Secondary | ICD-10-CM | POA: Diagnosis present

## 2024-07-02 DIAGNOSIS — G4733 Obstructive sleep apnea (adult) (pediatric): Secondary | ICD-10-CM | POA: Diagnosis present

## 2024-07-02 DIAGNOSIS — Z8719 Personal history of other diseases of the digestive system: Secondary | ICD-10-CM

## 2024-07-02 DIAGNOSIS — C801 Malignant (primary) neoplasm, unspecified: Secondary | ICD-10-CM | POA: Diagnosis present

## 2024-07-02 DIAGNOSIS — Z7984 Long term (current) use of oral hypoglycemic drugs: Secondary | ICD-10-CM

## 2024-07-02 DIAGNOSIS — R079 Chest pain, unspecified: Secondary | ICD-10-CM | POA: Diagnosis present

## 2024-07-02 DIAGNOSIS — E66812 Obesity, class 2: Secondary | ICD-10-CM | POA: Diagnosis present

## 2024-07-02 DIAGNOSIS — K219 Gastro-esophageal reflux disease without esophagitis: Secondary | ICD-10-CM | POA: Diagnosis present

## 2024-07-02 DIAGNOSIS — C786 Secondary malignant neoplasm of retroperitoneum and peritoneum: Secondary | ICD-10-CM | POA: Diagnosis present

## 2024-07-02 DIAGNOSIS — R0789 Other chest pain: Secondary | ICD-10-CM | POA: Diagnosis present

## 2024-07-02 DIAGNOSIS — Z955 Presence of coronary angioplasty implant and graft: Secondary | ICD-10-CM

## 2024-07-02 DIAGNOSIS — R180 Malignant ascites: Secondary | ICD-10-CM | POA: Diagnosis present

## 2024-07-02 DIAGNOSIS — G63 Polyneuropathy in diseases classified elsewhere: Secondary | ICD-10-CM | POA: Diagnosis present

## 2024-07-02 DIAGNOSIS — Z6837 Body mass index (BMI) 37.0-37.9, adult: Secondary | ICD-10-CM

## 2024-07-02 DIAGNOSIS — N400 Enlarged prostate without lower urinary tract symptoms: Secondary | ICD-10-CM | POA: Diagnosis present

## 2024-07-02 DIAGNOSIS — J90 Pleural effusion, not elsewhere classified: Secondary | ICD-10-CM | POA: Diagnosis present

## 2024-07-02 DIAGNOSIS — I2510 Atherosclerotic heart disease of native coronary artery without angina pectoris: Secondary | ICD-10-CM | POA: Diagnosis present

## 2024-07-02 DIAGNOSIS — Z87891 Personal history of nicotine dependence: Secondary | ICD-10-CM

## 2024-07-02 DIAGNOSIS — I200 Unstable angina: Secondary | ICD-10-CM | POA: Diagnosis present

## 2024-07-02 DIAGNOSIS — I872 Venous insufficiency (chronic) (peripheral): Secondary | ICD-10-CM | POA: Diagnosis present

## 2024-07-02 DIAGNOSIS — C787 Secondary malignant neoplasm of liver and intrahepatic bile duct: Secondary | ICD-10-CM | POA: Diagnosis present

## 2024-07-02 LAB — CANCER ANTIGEN 19-9: CA 19-9: 683 U/mL — ABNORMAL HIGH (ref 0–35)

## 2024-07-02 LAB — CEA: CEA: 20.9 ng/mL — ABNORMAL HIGH (ref 0.0–4.7)

## 2024-07-02 MED ORDER — HYDROMORPHONE HCL 1 MG/ML IJ SOLN
1.0000 mg | Freq: Once | INTRAMUSCULAR | Status: AC
  Administered 2024-07-03: 1 mg via INTRAVENOUS
  Filled 2024-07-02: qty 1

## 2024-07-02 MED ORDER — ONDANSETRON HCL 4 MG/2ML IJ SOLN
4.0000 mg | Freq: Once | INTRAMUSCULAR | Status: AC
Start: 2024-07-03 — End: 2024-07-03
  Administered 2024-07-03: 4 mg via INTRAVENOUS
  Filled 2024-07-02: qty 2

## 2024-07-02 NOTE — ED Triage Notes (Signed)
 [facility] from home  [facility] center cp 3 hours Took 6 total 81mg  aspirins and 2 NTG.  Nausea no vomiting.  Stabbing pain increased from 5 to 10/10  170/110 HR 78

## 2024-07-03 ENCOUNTER — Inpatient Hospital Stay (HOSPITAL_COMMUNITY)

## 2024-07-03 ENCOUNTER — Emergency Department (HOSPITAL_COMMUNITY)

## 2024-07-03 ENCOUNTER — Encounter: Payer: Self-pay | Admitting: Oncology

## 2024-07-03 ENCOUNTER — Other Ambulatory Visit: Payer: Self-pay

## 2024-07-03 ENCOUNTER — Inpatient Hospital Stay

## 2024-07-03 DIAGNOSIS — Z7984 Long term (current) use of oral hypoglycemic drugs: Secondary | ICD-10-CM | POA: Diagnosis not present

## 2024-07-03 DIAGNOSIS — C786 Secondary malignant neoplasm of retroperitoneum and peritoneum: Secondary | ICD-10-CM | POA: Diagnosis present

## 2024-07-03 DIAGNOSIS — Z96642 Presence of left artificial hip joint: Secondary | ICD-10-CM | POA: Diagnosis present

## 2024-07-03 DIAGNOSIS — I872 Venous insufficiency (chronic) (peripheral): Secondary | ICD-10-CM | POA: Diagnosis present

## 2024-07-03 DIAGNOSIS — F32A Depression, unspecified: Secondary | ICD-10-CM

## 2024-07-03 DIAGNOSIS — I959 Hypotension, unspecified: Secondary | ICD-10-CM | POA: Diagnosis not present

## 2024-07-03 DIAGNOSIS — C221 Intrahepatic bile duct carcinoma: Secondary | ICD-10-CM | POA: Diagnosis present

## 2024-07-03 DIAGNOSIS — Z743 Need for continuous supervision: Secondary | ICD-10-CM | POA: Diagnosis not present

## 2024-07-03 DIAGNOSIS — Z8719 Personal history of other diseases of the digestive system: Secondary | ICD-10-CM | POA: Diagnosis not present

## 2024-07-03 DIAGNOSIS — I25110 Atherosclerotic heart disease of native coronary artery with unstable angina pectoris: Secondary | ICD-10-CM | POA: Diagnosis present

## 2024-07-03 DIAGNOSIS — I2510 Atherosclerotic heart disease of native coronary artery without angina pectoris: Secondary | ICD-10-CM

## 2024-07-03 DIAGNOSIS — R072 Precordial pain: Secondary | ICD-10-CM | POA: Diagnosis not present

## 2024-07-03 DIAGNOSIS — Z7982 Long term (current) use of aspirin: Secondary | ICD-10-CM | POA: Diagnosis not present

## 2024-07-03 DIAGNOSIS — N400 Enlarged prostate without lower urinary tract symptoms: Secondary | ICD-10-CM | POA: Diagnosis present

## 2024-07-03 DIAGNOSIS — I10 Essential (primary) hypertension: Secondary | ICD-10-CM | POA: Diagnosis present

## 2024-07-03 DIAGNOSIS — G4733 Obstructive sleep apnea (adult) (pediatric): Secondary | ICD-10-CM

## 2024-07-03 DIAGNOSIS — C787 Secondary malignant neoplasm of liver and intrahepatic bile duct: Secondary | ICD-10-CM

## 2024-07-03 DIAGNOSIS — Z87891 Personal history of nicotine dependence: Secondary | ICD-10-CM | POA: Diagnosis not present

## 2024-07-03 DIAGNOSIS — Z6837 Body mass index (BMI) 37.0-37.9, adult: Secondary | ICD-10-CM | POA: Diagnosis not present

## 2024-07-03 DIAGNOSIS — R079 Chest pain, unspecified: Secondary | ICD-10-CM

## 2024-07-03 DIAGNOSIS — M7989 Other specified soft tissue disorders: Secondary | ICD-10-CM | POA: Diagnosis not present

## 2024-07-03 DIAGNOSIS — F419 Anxiety disorder, unspecified: Secondary | ICD-10-CM

## 2024-07-03 DIAGNOSIS — K219 Gastro-esophageal reflux disease without esophagitis: Secondary | ICD-10-CM

## 2024-07-03 DIAGNOSIS — Z79899 Other long term (current) drug therapy: Secondary | ICD-10-CM | POA: Diagnosis not present

## 2024-07-03 DIAGNOSIS — E66812 Obesity, class 2: Secondary | ICD-10-CM

## 2024-07-03 DIAGNOSIS — R0789 Other chest pain: Secondary | ICD-10-CM | POA: Diagnosis present

## 2024-07-03 DIAGNOSIS — E782 Mixed hyperlipidemia: Secondary | ICD-10-CM | POA: Diagnosis present

## 2024-07-03 DIAGNOSIS — R180 Malignant ascites: Secondary | ICD-10-CM | POA: Diagnosis present

## 2024-07-03 DIAGNOSIS — E1140 Type 2 diabetes mellitus with diabetic neuropathy, unspecified: Secondary | ICD-10-CM | POA: Diagnosis present

## 2024-07-03 DIAGNOSIS — I200 Unstable angina: Secondary | ICD-10-CM | POA: Diagnosis not present

## 2024-07-03 DIAGNOSIS — J90 Pleural effusion, not elsewhere classified: Secondary | ICD-10-CM | POA: Diagnosis present

## 2024-07-03 DIAGNOSIS — G2581 Restless legs syndrome: Secondary | ICD-10-CM | POA: Diagnosis present

## 2024-07-03 LAB — CBC WITH DIFFERENTIAL/PLATELET
Abs Immature Granulocytes: 0.02 K/uL (ref 0.00–0.07)
Basophils Absolute: 0 K/uL (ref 0.0–0.1)
Basophils Relative: 1 %
Eosinophils Absolute: 0 K/uL (ref 0.0–0.5)
Eosinophils Relative: 0 %
HCT: 41.4 % (ref 39.0–52.0)
Hemoglobin: 13.1 g/dL (ref 13.0–17.0)
Immature Granulocytes: 0 %
Lymphocytes Relative: 13 %
Lymphs Abs: 0.8 K/uL (ref 0.7–4.0)
MCH: 29.3 pg (ref 26.0–34.0)
MCHC: 31.6 g/dL (ref 30.0–36.0)
MCV: 92.6 fL (ref 80.0–100.0)
Monocytes Absolute: 0.7 K/uL (ref 0.1–1.0)
Monocytes Relative: 13 %
Neutro Abs: 4.2 K/uL (ref 1.7–7.7)
Neutrophils Relative %: 73 %
Platelets: 278 K/uL (ref 150–400)
RBC: 4.47 MIL/uL (ref 4.22–5.81)
RDW: 17.1 % — ABNORMAL HIGH (ref 11.5–15.5)
WBC: 5.7 K/uL (ref 4.0–10.5)
nRBC: 0 % (ref 0.0–0.2)

## 2024-07-03 LAB — COMPREHENSIVE METABOLIC PANEL WITH GFR
ALT: 16 U/L (ref 0–44)
AST: 30 U/L (ref 15–41)
Albumin: 4 g/dL (ref 3.5–5.0)
Alkaline Phosphatase: 112 U/L (ref 38–126)
Anion gap: 12 (ref 5–15)
BUN: 24 mg/dL — ABNORMAL HIGH (ref 8–23)
CO2: 27 mmol/L (ref 22–32)
Calcium: 10 mg/dL (ref 8.9–10.3)
Chloride: 99 mmol/L (ref 98–111)
Creatinine, Ser: 1.07 mg/dL (ref 0.61–1.24)
GFR, Estimated: >60 mL/min (ref >60)
Glucose, Bld: 131 mg/dL — ABNORMAL HIGH (ref 70–99)
Potassium: 4.4 mmol/L (ref 3.5–5.1)
Sodium: 138 mmol/L (ref 135–145)
Total Bilirubin: 0.3 mg/dL (ref 0.0–1.2)
Total Protein: 7.5 g/dL (ref 6.5–8.1)

## 2024-07-03 LAB — HEPARIN LEVEL (UNFRACTIONATED)
Heparin Unfractionated: 0.36 [IU]/mL (ref 0.30–0.70)
Heparin Unfractionated: 0.3 [IU]/mL (ref 0.30–0.70)

## 2024-07-03 LAB — ECHOCARDIOGRAM COMPLETE
AR max vel: 2.8 cm2
AV Area VTI: 3.41 cm2
AV Area mean vel: 2.87 cm2
AV Mean grad: 3 mmHg
AV Peak grad: 7.3 mmHg
Ao pk vel: 1.35 m/s
Area-P 1/2: 3.81 cm2
Calc EF: 63.8 %
Height: 72 in
MV VTI: 2.78 cm2
S' Lateral: 3.2 cm
Single Plane A2C EF: 66.6 %
Single Plane A4C EF: 60.7 %
Weight: 4366.87 [oz_av]

## 2024-07-03 LAB — TROPONIN T, HIGH SENSITIVITY
Troponin T High Sensitivity: 26 ng/L — ABNORMAL HIGH (ref 0–19)
Troponin T High Sensitivity: 31 ng/L — ABNORMAL HIGH (ref 0–19)
Troponin T High Sensitivity: 45 ng/L — ABNORMAL HIGH (ref 0–19)
Troponin T High Sensitivity: 59 ng/L — ABNORMAL HIGH (ref 0–19)

## 2024-07-03 LAB — HEMOGLOBIN A1C
Hgb A1c MFr Bld: 5.7 % — ABNORMAL HIGH (ref 4.8–5.6)
Mean Plasma Glucose: 116.89 mg/dL

## 2024-07-03 LAB — D-DIMER, QUANTITATIVE: D-Dimer, Quant: 3.95 ug{FEU}/mL — ABNORMAL HIGH (ref 0.00–0.50)

## 2024-07-03 LAB — PRO BRAIN NATRIURETIC PEPTIDE: Pro Brain Natriuretic Peptide: 218 pg/mL (ref <300.0)

## 2024-07-03 MED ORDER — ACETAMINOPHEN 325 MG PO TABS
650.0000 mg | ORAL_TABLET | ORAL | Status: DC | PRN
  Administered 2024-07-03 – 2024-07-04 (×2): 650 mg via ORAL
  Filled 2024-07-03 (×2): qty 2

## 2024-07-03 MED ORDER — SODIUM CHLORIDE 0.9 % IV SOLN
INTRAVENOUS | Status: AC
Start: 2024-07-03 — End: 2024-07-03

## 2024-07-03 MED ORDER — IRBESARTAN 300 MG PO TABS
300.0000 mg | ORAL_TABLET | Freq: Every day | ORAL | Status: DC
Start: 2024-07-03 — End: 2024-07-04
  Administered 2024-07-03: 300 mg via ORAL
  Filled 2024-07-03: qty 2
  Filled 2024-07-03: qty 1

## 2024-07-03 MED ORDER — ONDANSETRON HCL 4 MG/2ML IJ SOLN
4.0000 mg | Freq: Four times a day (QID) | INTRAMUSCULAR | Status: DC | PRN
  Administered 2024-07-03 – 2024-07-06 (×4): 4 mg via INTRAVENOUS
  Filled 2024-07-03 (×4): qty 2

## 2024-07-03 MED ORDER — HEPARIN SOD (PORK) LOCK FLUSH 100 UNIT/ML IV SOLN
INTRAVENOUS | Status: AC
  Filled 2024-07-03: qty 5

## 2024-07-03 MED ORDER — PANTOPRAZOLE SODIUM 40 MG PO TBEC
40.0000 mg | DELAYED_RELEASE_TABLET | Freq: Two times a day (BID) | ORAL | Status: DC
  Administered 2024-07-03 – 2024-07-07 (×9): 40 mg via ORAL
  Filled 2024-07-03 (×9): qty 1

## 2024-07-03 MED ORDER — IOHEXOL 350 MG/ML SOLN
75.0000 mL | Freq: Once | INTRAVENOUS | Status: AC | PRN
  Administered 2024-07-03: 75 mL via INTRAVENOUS

## 2024-07-03 MED ORDER — NITROGLYCERIN 0.4 MG SL SUBL
0.4000 mg | SUBLINGUAL_TABLET | SUBLINGUAL | Status: AC | PRN
  Administered 2024-07-03 (×3): 0.4 mg via SUBLINGUAL
  Filled 2024-07-03 (×2): qty 1

## 2024-07-03 MED ORDER — HEPARIN BOLUS VIA INFUSION
4000.0000 [IU] | Freq: Once | INTRAVENOUS | Status: AC
  Administered 2024-07-03: 4000 [IU] via INTRAVENOUS

## 2024-07-03 MED ORDER — TAMSULOSIN HCL 0.4 MG PO CAPS
0.4000 mg | ORAL_CAPSULE | Freq: Every day | ORAL | Status: DC
  Administered 2024-07-03 – 2024-07-07 (×5): 0.4 mg via ORAL
  Filled 2024-07-03 (×5): qty 1

## 2024-07-03 MED ORDER — CYCLOBENZAPRINE HCL 10 MG PO TABS: 10.0000 mg | ORAL_TABLET | Freq: Four times a day (QID) | ORAL | Status: DC | PRN

## 2024-07-03 MED ORDER — PANTOPRAZOLE SODIUM 40 MG IV SOLR
40.0000 mg | Freq: Once | INTRAVENOUS | Status: AC
  Administered 2024-07-03: 40 mg via INTRAVENOUS
  Filled 2024-07-03: qty 10

## 2024-07-03 MED ORDER — HEPARIN (PORCINE) 25000 UT/250ML-% IV SOLN
1900.0000 [IU]/h | INTRAVENOUS | Status: DC
  Administered 2024-07-03 (×2): 1800 [IU]/h via INTRAVENOUS
  Administered 2024-07-04: 1900 [IU]/h via INTRAVENOUS
  Filled 2024-07-03 (×3): qty 250

## 2024-07-03 MED ORDER — MORPHINE SULFATE (PF) 2 MG/ML IV SOLN
2.0000 mg | INTRAVENOUS | Status: DC | PRN
  Administered 2024-07-03: 4 mg via INTRAVENOUS
  Administered 2024-07-03: 2 mg via INTRAVENOUS
  Administered 2024-07-03: 4 mg via INTRAVENOUS
  Administered 2024-07-03 – 2024-07-04 (×2): 2 mg via INTRAVENOUS
  Filled 2024-07-03: qty 1
  Filled 2024-07-03: qty 2
  Filled 2024-07-03 (×2): qty 1
  Filled 2024-07-03: qty 2

## 2024-07-03 MED ORDER — ZOLPIDEM TARTRATE 5 MG PO TABS
5.0000 mg | ORAL_TABLET | Freq: Every evening | ORAL | Status: DC | PRN
Start: 2024-07-03 — End: 2024-07-07
  Administered 2024-07-03 – 2024-07-06 (×4): 5 mg via ORAL
  Filled 2024-07-03 (×4): qty 1

## 2024-07-03 MED ORDER — METHOCARBAMOL 1000 MG/10ML IJ SOLN
750.0000 mg | Freq: Three times a day (TID) | INTRAMUSCULAR | Status: DC | PRN
  Administered 2024-07-03 – 2024-07-04 (×2): 750 mg via INTRAVENOUS
  Filled 2024-07-03 (×2): qty 10

## 2024-07-03 MED ORDER — PANTOPRAZOLE SODIUM 40 MG PO TBEC: 40.0000 mg | DELAYED_RELEASE_TABLET | Freq: Every day | ORAL | Status: DC

## 2024-07-03 MED ORDER — FLUOXETINE HCL 10 MG PO CAPS
10.0000 mg | ORAL_CAPSULE | Freq: Every day | ORAL | Status: DC
  Administered 2024-07-04 – 2024-07-07 (×4): 10 mg via ORAL
  Filled 2024-07-03 (×5): qty 1

## 2024-07-03 MED ORDER — FENTANYL CITRATE (PF) 100 MCG/2ML IJ SOLN
100.0000 ug | Freq: Once | INTRAMUSCULAR | Status: AC
  Administered 2024-07-03: 100 ug via INTRAVENOUS
  Filled 2024-07-03: qty 2

## 2024-07-03 MED ORDER — ATORVASTATIN CALCIUM 10 MG PO TABS
10.0000 mg | ORAL_TABLET | Freq: Every day | ORAL | Status: DC
  Administered 2024-07-03 – 2024-07-05 (×3): 10 mg via ORAL
  Filled 2024-07-03 (×3): qty 1

## 2024-07-03 MED ORDER — SODIUM CHLORIDE 0.9 % IV BOLUS
500.0000 mL | Freq: Once | INTRAVENOUS | Status: AC
  Administered 2024-07-03: 500 mL via INTRAVENOUS

## 2024-07-03 MED ORDER — OXYCODONE HCL 5 MG PO TABS
5.0000 mg | ORAL_TABLET | ORAL | Status: DC | PRN
  Administered 2024-07-03 – 2024-07-04 (×5): 5 mg via ORAL
  Filled 2024-07-03 (×7): qty 1

## 2024-07-03 MED ORDER — ONDANSETRON HCL 4 MG/2ML IJ SOLN
4.0000 mg | Freq: Once | INTRAMUSCULAR | Status: AC
  Administered 2024-07-03: 4 mg via INTRAVENOUS
  Filled 2024-07-03: qty 2

## 2024-07-03 MED ORDER — BUPROPION HCL ER (XL) 150 MG PO TB24
300.0000 mg | ORAL_TABLET | Freq: Every day | ORAL | Status: DC
  Administered 2024-07-03 – 2024-07-07 (×5): 300 mg via ORAL
  Filled 2024-07-03 (×2): qty 2
  Filled 2024-07-03: qty 1
  Filled 2024-07-03 (×2): qty 2

## 2024-07-03 MED ORDER — ASPIRIN 81 MG PO TBEC
81.0000 mg | DELAYED_RELEASE_TABLET | Freq: Every day | ORAL | Status: DC
  Administered 2024-07-04 – 2024-07-07 (×4): 81 mg via ORAL
  Filled 2024-07-03 (×4): qty 1

## 2024-07-03 MED ORDER — PRAMIPEXOLE DIHYDROCHLORIDE 0.25 MG PO TABS
0.7500 mg | ORAL_TABLET | Freq: Three times a day (TID) | ORAL | Status: DC
  Administered 2024-07-03 – 2024-07-07 (×11): 0.75 mg via ORAL
  Filled 2024-07-03 (×16): qty 3

## 2024-07-03 NOTE — Consult Note (Signed)
 Cardiology Consultation   Patient ID: Marvin Carr MRN: [id number]; DOB: 1956-01-23  Admit date: 07/02/2024 Date of Consult: 07/03/2024  PCP:  [name], MD   [facility] Providers Cardiologist:  Jayson Sierras, MD     Patient Profile: Marvin Carr is a 68 y.o. 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 Ricky.  History of Present Illness: Marvin Carr was last seen in heart care OV 10/09/2023 by Laymon Qua, PA-C.  Reported episodes of unresponsiveness lasting for less than a second that has improved since using his CPAP.  Patient noted recording himself on WebCam and feels like he was actually falling asleep at the episodes.  Denied any other cardiac complaints.  Noted he self discontinuing Lipitor recently due to LDL of 58.  He was hypotensive in office with recent discontinuation on daily diuretic.  Increase amlodipine  from 5 to 10 mg daily.  Continued on olmesartan  40 mg daily, Bumex  2 mg as needed, ASA 81 mg daily, NTG as needed.   Presented to [facility] ED today with chest pain CBC/K/Cr WNL TN 26 > 31 > 45, D-dimer 3.95, proBNP 218 EKG: NSR, HR 80, first-degree AV block PR 216 MS with no significant changes CXR: New small left pleural effusion with left basilar atelectasis or pneumonia CTA chest showed negative PE, new small left pleural effusion with overlying mild pleural thickening, consolidation and atelectasis, aortic atherosclerosis, coronary artery calcification, multifocal hypodense liver lesion compatible with known malignancy,  peritoneal carcinomatosis.  ED consulted cardiology (Dr. Marty), who reviewed the EKG and recommended medical admission to [facility].  ED treated with IV heparin , IV Protonix , Zofran  X2, NTG X1, IV fentanyl  and IV Dilaudid .  On interview, patient reported chest pain started yesterday during the day while watching TV described as constant sharp/heaviness, 10/10, located midsternal with no radiation and associated with nausea.  CP not affected by position changes, deep breaths or coughing.  Patient tried ASA 81 mg x2  follow-up by NTG SL x2 followed by ASA 81 mg x4 as requested by EMS but no relief.  Patient states chest pain resolved with IV analgesics and started having severe back pain in between shoulder blades overnight since in the ED that is still present.  Denies any recent heavy lifting. Reports recent episodes of intermittent chest pain not associated with exertion that is usually relieved with 1-2 ASA 81 mg. Reports chronic SOB that has worsened recently with minimal exertion.  Also notes chronic unchanged orthopnea, dizziness with position changes, LE edema controlled with Bumex . Denies any diaphoresis, vomiting, syncope. Reports noncompliance with medications over the last couple of days.  Family history significant for father with CABG X5 and HLD on maternal/paternal side.  Patient quit smoking cigarettes approximately 5 years ago, admits to social EtOH and denies any drugs.  Patient is currently receiving chemo treatment for bowel cancer that metastasized to the liver.  Patient was recently switched to chemo pump on Tuesday that was supposed to be removed today.  Also notes having fluid removed from peritoneal recently.  Past Medical History:  Diagnosis Date   Anxiety    Arthritis  Bladder cancer (HCC) 09/11/2009   CAD (coronary artery disease), native coronary artery    a. Mildly elevated troponin 03/2013, cath with nonobstructive disease including 50% AV groove distal stenosis before large OM    Depression    Essential hypertension    Headache(784.0)    History of migraines   Hyperglycemia    Metastatic cancer to liver [facility])    Mixed hyperlipidemia    Neuropathy    Obesity    Port-A-Cath in place 09/30/2021   Pre-diabetes    Seasonal allergies    Sleep apnea    On CPAP    Past Surgical History:  Procedure Laterality Date   BIOPSY  09/23/2021   Procedure: BIOPSY;  Surgeon: Eartha Angelia Sieving, MD;  Location: [facility] ENDO SUITE;  Service: Gastroenterology;;   COLONOSCOPY  06/28/2011   Procedure: COLONOSCOPY;  Surgeon: Claudis RAYMOND Rivet, MD;  Location: [facility];  Service: Endoscopy;  Laterality: N/A;   COLONOSCOPY WITH PROPOFOL  N/A 09/23/2021   Procedure: COLONOSCOPY WITH PROPOFOL ;  Surgeon: Eartha Angelia Sieving, MD;  Location: [facility];  Service: Gastroenterology;  Laterality: N/A;  940   ESOPHAGOGASTRODUODENOSCOPY (EGD) WITH PROPOFOL  N/A 09/23/2021   Procedure: ESOPHAGOGASTRODUODENOSCOPY (EGD) WITH PROPOFOL ;  Surgeon: Eartha Angelia Sieving, MD;  Location: [facility];  Service: Gastroenterology;  Laterality: N/A;   IR IMAGING GUIDED PORT INSERTION  09/28/2021   IR PARACENTESIS  09/28/2021   JOINT REPLACEMENT Right    hip   LEFT HEART CATHETERIZATION WITH CORONARY ANGIOGRAM N/A 03/31/2013   Procedure: LEFT HEART CATHETERIZATION WITH CORONARY ANGIOGRAM;  Surgeon: Lynwood Schilling, MD;  Location: [facility] CATH LAB;  Service: Cardiovascular;  Laterality: N/A;   POLYPECTOMY  09/23/2021   Procedure: POLYPECTOMY;  Surgeon: Eartha Angelia Sieving, MD;  Location: [facility];  Service: Gastroenterology;;   TOTAL HIP ARTHROPLASTY Left 01/15/2024   Procedure: ARTHROPLASTY, HIP, TOTAL, ANTERIOR APPROACH;  Surgeon: Ernie Cough, MD;  Location: [facility] ORS;  Service: Orthopedics;  Laterality: Left;   TURBT  09/11/2009     Home Medications:  Prior to Admission medications   Medication Sig Start Date End Date Taking? Authorizing Provider  acetaminophen  (TYLENOL ) 500 MG  tablet Take 2 tablets (1,000 mg total) by mouth every 6 (six) hours. Patient taking differently: Take 1,000 mg by mouth every 6 (six) hours as needed for mild pain (pain score 1-3). 01/16/24   Patti Rosina SAUNDERS, PA-C  atorvastatin  (LIPITOR) 10 MG tablet Take 10 mg by mouth daily. 06/09/24   [provider]  benzonatate  (TESSALON ) 200 MG capsule Take 1 capsule (200 mg total) by mouth 3 (three) times daily as needed for cough. 05/15/24   Kandala, Hyndavi, MD  bumetanide  (BUMEX ) 2 MG tablet Take 1 tablet (2 mg total) by mouth daily as needed. 05/21/24   Strader, Laymon HERO, PA-C  buPROPion  (WELLBUTRIN  XL) 300 MG 24 hr tablet Take 300 mg by mouth daily. 09/17/23   [provider]  cetirizine  (ZYRTEC ) 10 MG tablet Take 10 mg by mouth daily.    [provider]  cyclobenzaprine  (FLEXERIL ) 10 MG tablet Take 10 mg by mouth every 6 (six) hours as needed for muscle spasms. 01/25/24   [provider]  dapagliflozin propanediol (FARXIGA) 10 MG TABS tablet Take 10 mg by mouth daily.    [provider]  dexamethasone  (DECADRON ) 4 MG tablet Take 2 tablets (8 mg total) by mouth daily. Start the day after chemotherapy for 2 days. Take with food. 07/01/24   Kandala, Hyndavi, MD  docusate sodium (  COLACE) 100 MG capsule Take 200 mg by mouth at bedtime.    [provider]  Durvalumab  (IMFINZI  IV) Inject into the vein every 21 ( twenty-one) days. 10/05/21   [provider]  Eszopiclone 3 MG TABS Take 3 mg by mouth at bedtime.    [provider]  famotidine  (PEPCID ) 20 MG tablet Take 20 mg by mouth at bedtime. 09/17/23   [provider]  fenofibrate  160 MG tablet Take 160 mg by mouth daily. 11/05/19   [provider]  FLUoxetine  (PROZAC ) 10 MG capsule Take 10 mg by mouth daily. 09/14/23   [provider]  guaiFENesin (MUCINEX) 600 MG 12 hr tablet Take 600 mg by mouth 2 (two) times daily as needed for cough or to loosen phlegm.    [provider]  lidocaine  (XYLOCAINE ) 2 % solution  05/23/24   [provider]  lidocaine -prilocaine  (EMLA ) cream Apply to affected area once 07/01/24   Kandala, Hyndavi, MD  magic mouthwash (lidocaine , diphenhydrAMINE , alum & mag hydroxide) suspension Swish and swallow 5 mLs 3 (three) times daily as needed for mouth pain. 05/23/24   Geofm Delon BRAVO, NP  magnesium  oxide (MAG-OX) 400 (240 Mg) MG tablet Take 1 tablet (400 mg total) by mouth 2 (two) times daily. 11/16/21   Rogers Hai, MD  metFORMIN  (GLUCOPHAGE -XR) 500 MG 24 hr tablet Take 500 mg by mouth 2 (two) times daily. 08/16/21   [provider]  Multiple Vitamin (MULTIVITAMIN WITH MINERALS) TABS tablet Take 1 tablet by mouth daily.    [provider]  nitroGLYCERIN  (NITROSTAT ) 0.4 MG SL tablet Place 0.4 mg under the tongue every 5 (five) minutes x 3 doses as needed for chest pain (if no relief after 2nd dose, proceed to ED or call 911).    [provider]  olmesartan  (BENICAR ) 40 MG tablet TAKE ONE TABLET BY MOUTH EVERY DAY 01/27/24   Rogers Hai, MD  omeprazole (PRILOSEC) 40 MG capsule Take 40 mg by mouth daily as needed (heartburn, acid reflux). 01/25/24   [provider]  ondansetron  (ZOFRAN ) 8 MG tablet Take 1 tablet (8 mg total) by mouth every 8 (eight) hours as needed for nausea or vomiting. Start on the third day after chemotherapy. 07/01/24   Davonna Siad, MD  pramipexole  (MIRAPEX ) 0.75 MG tablet Take 1 tablet (0.75 mg total) by mouth 3 (three) times daily. Patient taking differently: Take 0.75 mg by mouth in the morning, at noon, in the evening, and at bedtime. 08/30/22   Rogers Hai, MD  prochlorperazine  (COMPAZINE ) 10 MG tablet Take 1 tablet (10 mg total) by mouth every 6 (six) hours as needed for nausea or vomiting. 05/15/24   Kandala, Hyndavi, MD  prochlorperazine  (COMPAZINE ) 10 MG tablet Take 1 tablet (10 mg total) by mouth every 6 (six) hours as needed for nausea  or vomiting. 07/01/24   Davonna Siad, MD  Semaglutide  14 MG TABS Take 14 mg by mouth daily. 08/16/21   [provider]  tamsulosin  (FLOMAX ) 0.4 MG CAPS capsule Take 0.4 mg by mouth daily. 08/29/21   [provider]  traMADol  (ULTRAM ) 50 MG tablet Take 50 mg by mouth at bedtime as needed for severe pain (pain score 7-10).    [provider]  traZODone  (DESYREL ) 100 MG tablet Take 1 tablet (100 mg total) by mouth at bedtime. 06/25/24   Kandala, Hyndavi, MD    Scheduled Meds:  [START ON 07/04/2024] aspirin  EC  81 mg Oral Daily   atorvastatin   10 mg Oral Daily   buPROPion   300 mg Oral Daily   FLUoxetine   10 mg Oral Daily   irbesartan   300 mg Oral Daily   pantoprazole   40 mg Oral BID   pramipexole   0.75 mg Oral TID   tamsulosin   0.4 mg Oral Daily   Continuous Infusions:  sodium chloride  50 mL/hr at 07/03/24 9364   heparin  1,800 Units/hr (07/03/24 0417)   PRN Meds: acetaminophen , methocarbamol (ROBAXIN) injection, morphine  injection, nitroGLYCERIN , ondansetron  (ZOFRAN ) IV, oxyCODONE , zolpidem   Allergies:   No Known Allergies  Social History:   Social History   Socioeconomic History   Marital status: Divorced    Spouse name: Not on file   Number of children: 0   Years of education: college   Highest education level: Not on file  Occupational History    Employer: SELF-EMPLOYED  Tobacco Use   Smoking status: Former    Current packs/day: 0.00    Average packs/day: 0.5 packs/day for 10.0 years (5.0 ttl pk-yrs)    Types: Cigarettes    Start date: 07/09/2001    Quit date: 07/10/2011    Years since quitting: 12.9    Passive exposure: Past   Smokeless tobacco: Never   Tobacco comments:    Quit several yrs prior to 03/2013.  Vaping Use   Vaping status: Never Used  Substance and Sexual Activity   Alcohol use: Yes    Alcohol/week: 0.0 standard drinks of alcohol    Comment: Occasional   Drug use: No   Sexual activity: Not on file  Other Topics  Concern   Not on file  Social History Narrative   Not on file   Family History:   Family History  Problem Relation Age of Onset   COPD Father      ROS:  Please see the history of present illness.  All other ROS reviewed and negative.     Physical Exam/Data: Vitals:   07/03/24 0715 07/03/24 0815 07/03/24 0830 07/03/24 0845  BP: (!) 144/94 (!) 142/81 125/78 118/78  Pulse: 85 83 84 85  Resp: 20 (!) 21 (!) 22 (!) 21  Temp:      TempSrc:      SpO2: 96% 96% 97% 96%  Weight:      Height:       No intake or output data in the 24 hours ending 07/03/24 0922    07/02/2024   11:38 PM 07/01/2024    8:01 AM 06/24/2024    7:51 AM  Last 3 Weights  Weight (lbs) 272 lb 14.9 oz 272 lb 14.9 oz 280 lb 10.3 oz  Weight (kg) 123.8 kg 123.8 kg 127.3 kg     Body mass index is 37.02 kg/m.  General:  Well nourished, well developed, in no acute distress HEENT: normal Neck: no JVD Vascular: No carotid bruits; Distal pulses 2+ bilaterally Cardiac:  normal S1, S2; RRR; no murmur  Lungs:   diminished lung sounds in bases  Abd: soft, nontender, no hepatomegaly  Ext: no edema Musculoskeletal:  No deformities, BUE and BLE strength normal and equal Skin: warm and dry  Neuro:  CNs 2-12 intact, no focal abnormalities noted Psych:  Normal affect   EKG:  The EKG was personally reviewed and demonstrates:  NSR, HR 80, first-degree AV block PR 216 MS with no significant changes Telemetry:  Telemetry was personally reviewed and demonstrates:  NSR, HR 60-80's  Relevant CV Studies: ZIO 07/2023 AT reviewed.  10 days, 23 hours analyzed.  Monitor #1 Predominant rhythm is sinus with heart rate ranging from 71 bpm up to 99 bpm and average heart rate 83 bpm. There were rare PACs representing less than 1% total beats.  No ventricular ectopy. No pauses or high degree heart block.   Monitor #2 Predominant rhythm is sinus with heart rate ranging from 38 bpm up to 118 bpm and average heart rate 82  bpm. There were rare PACs including atrial couplets and triplets representing less than 1% total beats. There were rare PVCs including ventricular couplets representing less than 1% total beats. Several (35) episodes of brief PSVT were noted, the longest of which was 11 beats.  No sustained arrhythmias. No pauses or high degree heart block.  Exercise Myoview  02/2017 Narrative & Impression  Blood pressure demonstrated a hypertensive response to exercise. There was no ST segment deviation noted during stress. The study is normal. There are no perfusion defects consistent with prior infarct or current ischemia. This is a low risk study. 10 minutes on treadmill, Duke treadmill score of 10 consistent with low risk. The left ventricular ejection fraction is normal (55-65%). Occasional PVCs during recovery, no NSVT or VT    ECHO 04/2023 IMPRESSIONS   1. Left ventricular ejection fraction, by estimation, is 60 to 65%. The  left ventricle has normal function. The left ventricle has no regional  wall motion abnormalities. There is mild asymmetric left ventricular  hypertrophy of the inferior segment. Left   ventricular diastolic parameters were normal.   2. Right ventricular systolic function is mildly reduced. The right  ventricular size is normal. Tricuspid regurgitation signal is inadequate  for assessing PA pressure.   3. The mitral valve is normal in structure. No evidence of mitral valve  regurgitation. No evidence of mitral stenosis.   4. The aortic valve is normal in structure. Aortic valve regurgitation is  not visualized. No aortic stenosis is present.   5. The inferior vena cava is normal in size with greater than 50%  respiratory variability, suggesting right atrial pressure of 3 mmHg.   Laboratory Data: High Sensitivity Troponin:  No results for input(s): TROPONINIHS in the last 720 hours.   Chemistry Recent Labs  Lab 07/01/24 0805 07/03/24 0023  NA 134* 138  K 3.9 4.4  CL  97* 99  CO2 26 27  GLUCOSE 122* 131*  BUN 22 24*  CREATININE 1.02 1.07  CALCIUM  9.5 10.0  MG 1.9  --   GFRNONAA >60 >60  ANIONGAP 11 12    Recent Labs  Lab 07/01/24 0805 07/03/24 0023  PROT 6.8 7.5  ALBUMIN  3.6 4.0  AST 20 30  ALT 10 16  ALKPHOS 106 112  BILITOT 0.4 0.3   Lipids No results for input(s): CHOL, TRIG, HDL, LABVLDL, LDLCALC, CHOLHDL in the last 168 hours.  Hematology Recent Labs  Lab 07/01/24 0805 07/03/24 0023  WBC 6.5 5.7  RBC 3.92* 4.47  HGB 11.5* 13.1  HCT 36.3* 41.4  MCV 92.6 92.6  MCH 29.3 29.3  MCHC 31.7 31.6  RDW 17.4* 17.1*  PLT 313 278   Thyroid  No results for input(s): TSH, FREET4 in the last 168 hours.  BNP Recent Labs  Lab 07/03/24 0023  PROBNP 218.0    DDimer  Recent Labs  Lab 07/03/24 0150  DDIMER 3.95*    Radiology/Studies:  CT Angio Chest PE W and/or Wo Contrast Result Date: 07/03/2024 EXAM: CTA of the Chest with contrast for PE 07/03/2024 04:45:48 AM TECHNIQUE: CTA of the chest was  performed without and with the administration of intravenous contrast. 75 mL of iohexol  (OMNIPAQUE ) 350 MG/ML injection. Multiplanar reformatted images are provided for review. MIP images are provided for review. Automated exposure control, iterative reconstruction, and/or weight based adjustment of the mA/kV was utilized to reduce the radiation dose to as low as reasonably achievable. COMPARISON: 05/15/2024 CLINICAL HISTORY: Pulmonary embolism (PE) suspected, high prob. Cc center cp 3 hours Nausea no vomiting. Stabbing pain increased from 5 to 10/10 170/110 HR 78 FINDINGS: PULMONARY ARTERIES: Pulmonary arteries are adequately opacified for evaluation. No pulmonary embolism. Main pulmonary artery is normal in caliber. MEDIASTINUM: The heart and pericardium demonstrate coronary artery calcifications. Aortic atherosclerosis is present. LYMPH NODES: No mediastinal, hilar or axillary lymphadenopathy. LUNGS AND PLEURA: Atelectasis and  subpleural consolidation change. New small left pleural effusion with overlying mild pleural thickening overlying the posterior right base. No pneumothorax. UPPER ABDOMEN: Corresponding to known malignancy suboptimally visualized reflecting phase of contrast enhancement. Index lesion within the anterior left lobe of liver measures approximately 1.8 x 1.5 cm image 114/4. Previously 1.5 x 1.0 cm. Peritoneal soft tissue nodularity is identified compared peritoneal thickening within the left upper quadrant of the abdomen with adjacent nodularity compatible with known peritoneal carcinomatosis. SOFT TISSUES AND BONES: No acute or suspicious osseous findings. No acute soft tissue abnormality. IMPRESSION: 1. No evidence of pulmonary embolism. 2. New small left pleural effusion with overlying mild pleural thickening, consolidation and atelectasis. 3. Multifocal hypodense liver lesions compatible with known malignancy. Suboptimally visualized on the current exam due to phase of contrast opacification. 4. Peritoneal soft tissue nodularity compatible with peritoneal carcinomatosis. 5. Aortic atherosclerosis and coronary artery calcifications. Electronically signed by: Waddell Calk MD 07/03/2024 05:14 AM EDT RP Workstation: [id number]   DG Chest Portable 1 View Result Date: 07/03/2024 EXAM: 1 VIEW(S) XRAY OF THE CHEST 07/03/2024 12:27:00 AM COMPARISON: 06/01/2023 CLINICAL HISTORY: eval for pneumonia/chest pain. eval for pneumonia/chest pain - slight dizziness upon standing, mid AP region chest pressure, slight cough FINDINGS: LINES, TUBES AND DEVICES: Right chest port with tip in SVC. LUNGS AND PLEURA: Left basilar opacities. New small left pleural effusion. No pulmonary edema. No pneumothorax. HEART AND MEDIASTINUM: No acute abnormality of the cardiac and mediastinal silhouettes. BONES AND SOFT TISSUES: No acute osseous abnormality. IMPRESSION: 1. New small left pleural effusion with left basilar atelectasis or pneumonia.  Electronically signed by: Norman Gatlin MD 07/03/2024 12:33 AM EDT RP Workstation: [id number]     Assessment and Plan: CAD HLD Nonobstructive disease by prior cardiac catheterization in 2014, low-risk NST in 02/2017 Last ECHO 04/2023: 60-65%, no RWMA. Order Echo.  Presents with chest pain that started at rest described as constant sharp/heaviness 10/10, located midsternal associated with nausea.  No relief with ASA or NTG.  Resolved with IV analgesics then developed severe back pain.  TN 26 > 31 > 45. Order Troponin.  EKG: NSR, HR 80, first-degree AV block PR 216 MS with no significant changes ED treated with IV heparin , IV Protonix , Zofran  X2, NTG X1, IV fentanyl  and IV Dilaudid . Continue on ASA 81 mg daily, Lipitor 10 mg, IV heparin   Discussed benefits and risk associated with stress test vs Cardiac CTA vs cath. Patient is willing to proceed with either recommendation.  Would recommend Lexiscan  with previous ischemic workup results and presentation. Will discuss with MD. Can reassess based on work up.  Verified with CT imaging that chemo pump would not hinder cardiac CTA.   HTN BP 124/72 during consult   Continue on irbesartan  300  mg daily  Pleural effusion ECHO 04/2023: EF 60 to 65%, mild asymmetrical LVH, mildly reduced right RV function, no valvular abnormalities. Order ECHO.  Presents with worsening SOB, chronic unchanged orthopnea and LE edema.  proBNP WNL CXR and CTA chest C/W new small left pleural effusion. Diminished lung sound in bases. No other signs of volume overload. Would consider IV diuretics.  Suspect secondary to malignancy as below.  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  [place]  Heart Association (NYHA) Functional Class NYHA Class III  For questions or updates, please contact [facility] Please consult [email or web address] for contact info under      Signed, Lorette CINDERELLA Kapur, PA-C  07/03/2024 9:22 AM

## 2024-07-03 NOTE — Progress Notes (Signed)
 PHARMACY - ANTICOAGULATION CONSULT NOTE  Pharmacy Consult for heparin  Indication: r/o ACS vs PE  No Known Allergies  Patient Measurements: Height: 6' (182.9 cm) Weight: 123.8 kg (272 lb 14.9 oz) IBW/kg (Calculated) : 77.6 HEPARIN  DW (KG): 105  Vital Signs: Temp: 98.3 F (36.8 C) (10/23 0041) Temp Source: Oral (10/23 0041) BP: 128/80 (10/23 0315) Pulse Rate: 84 (10/23 0315)  Labs: Recent Labs    07/01/24 0805 07/03/24 0023  HGB 11.5* 13.1  HCT 36.3* 41.4  PLT 313 278  CREATININE 1.02 1.07    Estimated Creatinine Clearance: 88.6 mL/min (by C-G formula based on SCr of 1.07 mg/dL).   Medical History: Past Medical History:  Diagnosis Date   Anxiety    Arthritis    Bladder cancer (HCC) 09/11/2009   CAD (coronary artery disease), native coronary artery    a. Mildly elevated troponin 03/2013, cath with nonobstructive disease including 50% AV groove distal stenosis before large OM   Depression    Essential hypertension    Headache(784.0)    History of migraines   Hyperglycemia    Metastatic cancer to liver (HCC)    Mixed hyperlipidemia    Neuropathy    Obesity    Port-A-Cath in place 09/30/2021   Pre-diabetes    Seasonal allergies    Sleep apnea    On CPAP    Assessment: 69yo male c/o stabbing CP which started to radiate to jaw a few hours after presentation to ED, initial troponins mildly elevated but D-dimer also elevated >> to begin heparin  for ACS vs PE, currently awaiting CTA of chest.  Goal of Therapy:  Heparin  level 0.3-0.7 units/ml Monitor platelets by anticoagulation protocol: Yes   Plan:  Heparin  4000 units IV bolus followed by infusion at 1800 units/hr. Monitor heparin  levels and CBC.  Marvetta Dauphin, PharmD, BCPS  07/03/2024,3:45 AM

## 2024-07-03 NOTE — Progress Notes (Signed)
 Progress Note   Patient: Marvin Carr FMW:[id number] DOB: [birth year] DOA: 07/02/2024     0 DOS: the patient was seen and examined on 07/03/2024   Brief hospital admission narrative: As per H&P written by Dr. Charlton on 07/03/2024 Marvin Carr is a 68 y.o. male with medical history significant for hypertension, depression, anxiety, insomnia, OSA on CPAP, CAD, and cholangiocarcinoma with liver lesions, peritoneal carcinomatosis, and malignant ascites who presents with chest pain.   Patient was watching TV when he developed chest pain.  He reports sharp pain localized to the central chest that worsened slowly.  This has been associated with nausea but no vomiting.  He took six 81 mg aspirin  and 2 nitroglycerin , and called EMS.  He feels that the nitroglycerin  may have helped slightly.  He had similar symptoms more than 10 years ago, but not since then.   ED Course: Upon arrival to the ED, patient is found to be afebrile and saturating low 90s on room air with normal HR and stable BP.  Labs are most notable for D-dimer 3.95, troponin 26, normal CBC, and normal LFTs.  CTA is negative for PE but notable for new small left pleural effusion with mild pleural thickening and consolidation, liver lesions consistent with known malignancy, and peritoneal soft tissue nodularity.  EKG demonstrates sinus rhythm with inferolateral ST changes.   Cardiology (Dr. Marty) was consulted by the ED physician, reviewed the EKGs, and recommended medical admission to [facility].  Patient was started on IV heparin  and treated with IV Protonix , Zofran  x 2, sublingual nitroglycerin  x 1, fentanyl , and Dilaudid .  Assessment and plan 1-chest pain - Somewhat atypical but with atypical features and high risk associated factors. - Case was discussed with cardiology service who recommended admission to [facility] for further evaluation and management - Continue to follow troponin - Follow-up 2D echo, telemetry  and repeat EKG. - Continue treatment with aspirin , statin, oxygen/morphine  and IV heparin   2-GERD - Continue PPI  3-hypertension - Continue current antihypertensive agents and follow vital signs.  4-obesity/OSA -Body mass index is 37.02 kg/m.  - Low-calorie diet, portion control discussed with patient - Continue CPAP nightly.  5-history of cholangiocarcinoma with metastasis - Continue outpatient treatment with oncology service.  6-depression/anxiety - Continue management with Prozac  and Wellbutrin  - Overall stable mood - No suicidal ideation or hallucinations.  7-RLS - Continue Mirapex .   Subjective:  Reports ongoing chest discomfort (improved) and also significant pain in his back in between shoulder blades.  No respiratory distress.  Patient reports no nausea, no vomiting and is currently afebrile.  Physical Exam: Vitals:   07/03/24 0630 07/03/24 0645 07/03/24 0700 07/03/24 0715  BP: 115/81 105/79 108/75 (!) 144/94  Pulse: 84 79 80 85  Resp: (!) 21 17 20 20   Temp:      TempSrc:      SpO2: 94% 96% 97% 96%  Weight:      Height:       General exam: Alert, awake, oriented x 3; reporting discomfort in his chest and significant pain in his back. Respiratory system: Good air movement bilaterally; no wheezing, no crackles, no using accessory muscle.  2 L supplementation in place as part of management for ongoing chest pain. Cardiovascular system:RRR.  No rubs, no gallops, no murmurs; no JVD on exam. Gastrointestinal system: Abdomen is obese, nondistended, soft and nontender. No organomegaly or masses felt. Normal bowel sounds heard. Central nervous system: No focal neurological deficits. Extremities: No cyanosis or clubbing; trace  edema appreciated bilaterally. Skin: No petechiae. Psychiatry: Judgement and insight appear normal. Mood & affect appropriate.    Data Reviewed: High sensitive troponin:26 >> 31 >> 45 D-dimer: 3.95 - CT angiogram of the chest: Negative for  pulmonary embolism. Comprehensive metabolic panel: Sodium 138, potassium 4.4, chloride 99, bicarb 27, BUN 24, creatinine 1.07, normal LFTs and GFR >60 CBC: WBCs 5.7, hemoglobin 13.1 and platelet count 278K.   Family Communication: No family at bedside.  Disposition: Status is: Inpatient Remains inpatient appropriate because: Continue IV therapy.  Anticipating discharge back home once medically stable.  Time spent: 50 minutes  Author: Eric Nunnery, MD 07/03/2024 8:46 AM  For on call review [email or web address].

## 2024-07-03 NOTE — ED Notes (Signed)
 Pt SPO2 dropped to 88%, pt placed on 2L of O2.

## 2024-07-03 NOTE — Progress Notes (Signed)
 PHARMACY - ANTICOAGULATION CONSULT NOTE  Pharmacy Consult for heparin  Indication: r/o ACS  No Known Allergies  Patient Measurements: Height: 6' (182.9 cm) Weight: 123.8 kg (272 lb 14.9 oz) IBW/kg (Calculated) : 77.6 HEPARIN  DW (KG): 105  Vital Signs: Temp: 98.3 F (36.8 C) (10/23 0041) Temp Source: Oral (10/23 0041) BP: 144/94 (10/23 0715) Pulse Rate: 85 (10/23 0715)  Labs: Recent Labs    07/01/24 0805 07/03/24 0023  HGB 11.5* 13.1  HCT 36.3* 41.4  PLT 313 278  CREATININE 1.02 1.07    Estimated Creatinine Clearance: 88.6 mL/min (by C-G formula based on SCr of 1.07 mg/dL).   Medical History: Past Medical History:  Diagnosis Date   Anxiety    Arthritis    Bladder cancer (HCC) 09/11/2009   CAD (coronary artery disease), native coronary artery    a. Mildly elevated troponin 03/2013, cath with nonobstructive disease including 50% AV groove distal stenosis before large OM   Depression    Essential hypertension    Headache(784.0)    History of migraines   Hyperglycemia    Metastatic cancer to liver (HCC)    Mixed hyperlipidemia    Neuropathy    Obesity    Port-A-Cath in place 09/30/2021   Pre-diabetes    Seasonal allergies    Sleep apnea    On CPAP    Assessment: 68yo male c/o stabbing CP which started to radiate to jaw a few hours after presentation to ED, initial troponins mildly elevated but D-dimer also elevated >> to begin heparin  for ACS. CTA was negative for PE.  Heparin  level at goal this morning on 1800 units/hr. No bleeding or IV issues noted.   Goal of Therapy:  Heparin  level 0.3-0.7 units/ml Monitor platelets by anticoagulation protocol: Yes   Plan:  Continue heparin  infusion at 1800 units/hr. Check confirmatory heparin  level this evening Monitor heparin  levels and CBC.  Dempsey Blush PharmD., BCPS Clinical Pharmacist 07/03/2024 8:38 AM

## 2024-07-03 NOTE — ED Notes (Signed)
[facility] called to transport patient. Nurse aware.

## 2024-07-03 NOTE — Progress Notes (Addendum)
 PHARMACY - ANTICOAGULATION CONSULT NOTE  Pharmacy Consult for heparin  Indication: r/o ACS  No Known Allergies  Patient Measurements: Height: 6' (182.9 cm) Weight: 123.8 kg (272 lb 14.9 oz) IBW/kg (Calculated) : 77.6 HEPARIN  DW (KG): 105  Vital Signs: Temp: 97.8 F (36.6 C) (10/23 1800) Temp Source: Oral (10/23 1800) BP: 150/90 (10/23 1245) Pulse Rate: 92 (10/23 1800)  Labs: Recent Labs    07/01/24 0805 07/03/24 0023 07/03/24 0937 07/03/24 1804  HGB 11.5* 13.1  --   --   HCT 36.3* 41.4  --   --   PLT 313 278  --   --   HEPARINUNFRC  --   --  0.36 0.30  CREATININE 1.02 1.07  --   --     Estimated Creatinine Clearance: 88.6 mL/min (by C-G formula based on SCr of 1.07 mg/dL).   Medical History: Past Medical History:  Diagnosis Date   Anxiety    Arthritis    Bladder cancer (HCC) 09/11/2009   CAD (coronary artery disease), native coronary artery    a. Mildly elevated troponin 03/2013, cath with nonobstructive disease including 50% AV groove distal stenosis before large OM   Depression    Essential hypertension    Headache(784.0)    History of migraines   Hyperglycemia    Metastatic cancer to liver (HCC)    Mixed hyperlipidemia    Neuropathy    Obesity    Port-A-Cath in place 09/30/2021   Pre-diabetes    Seasonal allergies    Sleep apnea    On CPAP    Assessment: 68yo male c/o stabbing CP which started to radiate to jaw a few hours after presentation to ED, initial troponins mildly elevated but D-dimer also elevated >> to begin heparin  for ACS. CTA was negative for PE.  Heparin  level is just therapeutic at 0.3 this afternoon on 1800 units/hr. No bleeding or IV issues reported.   Goal of Therapy:  Heparin  level 0.3-0.7 units/ml Monitor platelets by anticoagulation protocol: Yes   Plan:  Increase heparin  infusion to 1900 units/hr. Check anti-Xa level daily while on heparin  Continue to monitor H&H and platelets   Thank you for allowing pharmacy to be a  part of this patient's care.   Bascom JAYSON Louder, PharmD 07/03/2024 6:55 PM  **Pharmacist phone directory can be found on [email or web address] listed under [facility]**

## 2024-07-03 NOTE — ED Notes (Signed)
 Pt called out to nurses station asking to see the nurse. This nurse went to speak to pt. Pt stated his pain is coming back and that he was having jaw soreness. This nurse asked if this is something he has felt before. Pt stated with he had his MI a few years ago. This nurse captured another EKG. EKG was printed and hand delivered to EDP who was made aware of situation and pts pain.

## 2024-07-03 NOTE — ED Notes (Signed)
 Pts 5FU pump deaccessed per EDP order. Pt had remaining in chemo bag. Chemo discarded into yellow bio hazard bin provided by the [facility].

## 2024-07-03 NOTE — ED Notes (Addendum)
 [name]

## 2024-07-03 NOTE — Progress Notes (Signed)
 Pt BP 90/60 MAP 69  Pt wife concerned that BP is low On call MD contacted via page  Pt asymptomatic, eating dinner  Received 2 nitros at 1956 and 2002 Morphine  2mg  IV at 2018

## 2024-07-03 NOTE — ED Provider Notes (Signed)
 [facility] AT [facility] Provider Note   CSN: [id number] Arrival date & time: 07/02/24  2331     Patient presents with: Chest Pain   Marvin Carr is a 68 y.o. male.   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 The pain is described as stabbing, located centrally in the chest, and has progressively worsened from an initial intensity of 5-6/10 to 10/10. The patient self-administered six aspirin  tablets, each 81 mg, prior to arrival. He reports associated symptoms of nausea, dry heaving, dizziness, and lightheadedness, but denies vomiting or radiation of the chest pain. The patient has a history of a myocardial infarction in 2011 and is currently undergoing chemotherapy, with a recent pump insertion for treatment. He also reports a significant weight loss of 35-40 pounds since August due to decreased appetite and nausea. The patient has no known history of heart failure, but takes diuretics, and denies any history of blood clots. He experiences occasional shortness of breath and a productive cough, but denies hemoptysis. The patient is under the care of a new oncologist and recently started folfox infusion (Tuesday). History was obtained from both the patient and EMS along with supplementation from Epic.   Chest Pain      Prior to Admission medications   Medication Sig Start Date End Date Taking? Authorizing Provider  acetaminophen  (TYLENOL ) 500 MG tablet Take 2 tablets (1,000 mg total) by mouth every 6 (six) hours. Patient taking differently: Take 1,000 mg by mouth every 6 (six) hours as needed for mild pain (pain score 1-3). 01/16/24   Patti Rosina SAUNDERS, PA-C  atorvastatin  (LIPITOR) 10 MG tablet Take 10 mg by mouth daily. 06/09/24   [provider]  benzonatate  (TESSALON ) 200 MG capsule Take 1 capsule (200 mg total) by mouth 3 (three)  times daily as needed for cough. 05/15/24   Kandala, Hyndavi, MD  bumetanide  (BUMEX ) 2 MG tablet Take 1 tablet (2 mg total) by mouth daily as needed. 05/21/24   Strader, Laymon HERO, PA-C  buPROPion  (WELLBUTRIN  XL) 300 MG 24 hr tablet Take 300 mg by mouth daily. 09/17/23   [provider]  cetirizine  (ZYRTEC ) 10 MG tablet Take 10 mg by mouth daily.    [provider]  cyclobenzaprine  (FLEXERIL ) 10 MG tablet Take 10 mg by mouth every 6 (six) hours as needed for muscle spasms. 01/25/24   [provider]  dapagliflozin propanediol (FARXIGA) 10 MG TABS tablet Take 10 mg by mouth daily.    [provider]  dexamethasone  (DECADRON ) 4 MG tablet Take 2 tablets (8 mg total) by mouth daily. Start the day after chemotherapy for 2 days. Take with food. 07/01/24   Davonna Siad, MD  docusate sodium (COLACE) 100 MG capsule Take 200 mg by mouth at bedtime.    [provider]  Durvalumab  (IMFINZI  IV) Inject into the vein every 21 ( twenty-one) days. 10/05/21   [provider]  Eszopiclone 3 MG TABS Take 3 mg by mouth at bedtime.    [provider]  famotidine  (PEPCID ) 20 MG tablet Take 20 mg by mouth at bedtime. 09/17/23   [provider]  fenofibrate  160 MG tablet Take 160 mg by mouth daily. 11/05/19   [provider]  FLUoxetine  (PROZAC ) 10 MG capsule Take 10 mg by mouth daily. 09/14/23   [provider]  guaiFENesin (MUCINEX) 600 MG 12 hr tablet Take 600  mg by mouth 2 (two) times daily as needed for cough or to loosen phlegm.    [provider]  lidocaine  (XYLOCAINE ) 2 % solution  05/23/24   [provider]  lidocaine -prilocaine  (EMLA ) cream Apply to affected area once 07/01/24   Kandala, Hyndavi, MD  magic mouthwash (lidocaine , diphenhydrAMINE , alum & mag hydroxide) suspension Swish and swallow 5 mLs 3 (three) times daily as needed for mouth pain. 05/23/24   Geofm Delon BRAVO, NP  magnesium  oxide (MAG-OX) 400 (240  Mg) MG tablet Take 1 tablet (400 mg total) by mouth 2 (two) times daily. 11/16/21   Rogers Hai, MD  metFORMIN  (GLUCOPHAGE -XR) 500 MG 24 hr tablet Take 500 mg by mouth 2 (two) times daily. 08/16/21   [provider]  Multiple Vitamin (MULTIVITAMIN WITH MINERALS) TABS tablet Take 1 tablet by mouth daily.    [provider]  nitroGLYCERIN  (NITROSTAT ) 0.4 MG SL tablet Place 0.4 mg under the tongue every 5 (five) minutes x 3 doses as needed for chest pain (if no relief after 2nd dose, proceed to ED or call 911).    [provider]  olmesartan  (BENICAR ) 40 MG tablet TAKE ONE TABLET BY MOUTH EVERY DAY 01/27/24   Rogers Hai, MD  omeprazole (PRILOSEC) 40 MG capsule Take 40 mg by mouth daily as needed (heartburn, acid reflux). 01/25/24   [provider]  ondansetron  (ZOFRAN ) 8 MG tablet Take 1 tablet (8 mg total) by mouth every 8 (eight) hours as needed for nausea or vomiting. Start on the third day after chemotherapy. 07/01/24   Kandala, Hyndavi, MD  pramipexole  (MIRAPEX ) 0.75 MG tablet Take 1 tablet (0.75 mg total) by mouth 3 (three) times daily. Patient taking differently: Take 0.75 mg by mouth in the morning, at noon, in the evening, and at bedtime. 08/30/22   Rogers Hai, MD  prochlorperazine  (COMPAZINE ) 10 MG tablet Take 1 tablet (10 mg total) by mouth every 6 (six) hours as needed for nausea or vomiting. 05/15/24   Kandala, Hyndavi, MD  prochlorperazine  (COMPAZINE ) 10 MG tablet Take 1 tablet (10 mg total) by mouth every 6 (six) hours as needed for nausea or vomiting. 07/01/24   Davonna Siad, MD  Semaglutide  14 MG TABS Take 14 mg by mouth daily. 08/16/21   [provider]  tamsulosin  (FLOMAX ) 0.4 MG CAPS capsule Take 0.4 mg by mouth daily. 08/29/21   [provider]  traMADol  (ULTRAM ) 50 MG tablet Take 50 mg by mouth at bedtime as needed for severe pain (pain score 7-10).    [provider]  traZODone  (DESYREL ) 100 MG  tablet Take 1 tablet (100 mg total) by mouth at bedtime. 06/25/24   Kandala, Hyndavi, MD    Allergies: Patient has no known allergies.    Review of Systems  Cardiovascular:  Positive for chest pain.    Updated Vital Signs BP 128/80   Pulse 84   Temp 98.3 F (36.8 C) (Oral)   Resp 19   Ht 6' (1.829 m)   Wt 123.8 kg   SpO2 95%   BMI 37.02 kg/m   Physical Exam Vitals and nursing note reviewed.  Constitutional:      Appearance: He is well-developed.  HENT:     Head: Normocephalic and atraumatic.  Cardiovascular:     Rate and Rhythm: Normal rate.  Pulmonary:     Effort: Pulmonary effort is normal. No respiratory distress.     Breath sounds: Decreased breath sounds present.     Comments: Pulse ox  range from 91-95 on room air, intermittently tachypneic Chest:     Comments: Right chest port accessed with pump running Abdominal:     General: There is no distension.  Musculoskeletal:        General: Normal range of motion.     Cervical back: Normal range of motion.  Skin:    Comments: Copper colored skin on LE's, no obvious asymmetry in calves  Neurological:     Mental Status: He is alert.     (all labs ordered are listed, but only abnormal results are displayed) Labs Reviewed  CBC WITH DIFFERENTIAL/PLATELET - Abnormal; Notable for the following components:      Result Value   RDW 17.1 (*)    All other components within normal limits  COMPREHENSIVE METABOLIC PANEL WITH GFR - Abnormal; Notable for the following components:   Glucose, Bld 131 (*)    BUN 24 (*)    All other components within normal limits  D-DIMER, QUANTITATIVE - Abnormal; Notable for the following components:   D-Dimer, Quant 3.95 (*)    All other components within normal limits  TROPONIN T, HIGH SENSITIVITY - Abnormal; Notable for the following components:   Troponin T High Sensitivity 26 (*)    All other components within normal limits  TROPONIN T, HIGH SENSITIVITY - Abnormal; Notable for the  following components:   Troponin T High Sensitivity 31 (*)    All other components within normal limits  PRO BRAIN NATRIURETIC PEPTIDE  HEPARIN  LEVEL (UNFRACTIONATED)    EKG: None   EKG Interpretation Date/Time:  Thursday July 03 2024 02:26:19 EDT Ventricular Rate:  71 PR Interval:  221 QRS Duration:  101 QT Interval:  376 QTC Calculation: 409 R Axis:   70  Text Interpretation: Sinus rhythm Prolonged PR interval Inferior infarct, acute (LCx) Lateral leads are also involved inferior ST changes more pronounced than earlier Confirmed by Lorette Mayo [phone]) on 07/03/2024 11:20:01 PM        EKG Interpretation Date/Time:  Thursday July 03 2024 02:38:08 EDT Ventricular Rate:  75 PR Interval:  210 QRS Duration:  98 QT Interval:  384 QTC Calculation: 429 R Axis:   69  Text Interpretation: Sinus rhythm Inferior infarct, acute (LCx) Lateral leads are also involved persistent changes similar to slightly improved from earlier Confirmed by Lorette Mayo [phone]) on 07/03/2024 11:20:40 PM         EKG Interpretation Date/Time:  Thursday July 03 2024 03:14:19 EDT Ventricular Rate:  83 PR Interval:  189 QRS Duration:  93 QT Interval:  373 QTC Calculation: 439 R Axis:   66  Text Interpretation: Sinus rhythm Low voltage, precordial leads improved ST changes Confirmed by Lorette Mayo [phone]) on 07/03/2024 11:21:05 PM        EKG Interpretation Date/Time:  Thursday July 03 2024 05:00:19 EDT Ventricular Rate:  80 PR Interval:  54 QRS Duration:  97 QT Interval:  369 QTC Calculation: 426 R Axis:   67  Text Interpretation: Sinus rhythm Short PR interval Low voltage, precordial leads ST elevation suggests acute pericarditis Confirmed by Lorette Mayo [phone]) on 07/03/2024 11:21:31 PM         Radiology: DG Chest Portable 1 View Result Date: 07/03/2024 EXAM: 1 VIEW(S) XRAY OF THE CHEST 07/03/2024 12:27:00 AM COMPARISON: 06/01/2023 CLINICAL HISTORY: eval for  pneumonia/chest pain. eval for pneumonia/chest pain - slight dizziness upon standing, mid AP region chest pressure, slight cough FINDINGS: LINES, TUBES AND DEVICES: Right chest port with tip in SVC. LUNGS AND  PLEURA: Left basilar opacities. New small left pleural effusion. No pulmonary edema. No pneumothorax. HEART AND MEDIASTINUM: No acute abnormality of the cardiac and mediastinal silhouettes. BONES AND SOFT TISSUES: No acute osseous abnormality. IMPRESSION: 1. New small left pleural effusion with left basilar atelectasis or pneumonia. Electronically signed by: Norman Gatlin MD 07/03/2024 12:33 AM EDT RP Workstation: [id number]     .Critical Care  Performed by: Lorette Mayo, MD Authorized by: Lorette Mayo, MD   Critical care provider statement:    Critical care time (minutes):  30   Critical care was necessary to treat or prevent imminent or life-threatening deterioration of the following conditions:  Circulatory failure and cardiac failure   Critical care was time spent personally by me on the following activities:  Development of treatment plan with patient or surrogate, discussions with consultants, evaluation of patient's response to treatment, examination of patient, ordering and review of laboratory studies, ordering and review of radiographic studies, ordering and performing treatments and interventions, pulse oximetry, re-evaluation of patient's condition and review of old charts    Medications Ordered in the ED  iohexol  (OMNIPAQUE ) 350 MG/ML injection 75 mL (has no administration in time range)  nitroGLYCERIN  (NITROSTAT ) SL tablet 0.4 mg (0.4 mg Sublingual Given 07/03/24 0249)  heparin  lock flush 100 UNIT/ML injection (has no administration in time range)  heparin  bolus via infusion 4,000 Units (has no administration in time range)  heparin  ADULT infusion 100 units/mL (25000 units/250mL) (has no administration in time range)  HYDROmorphone  (DILAUDID ) injection 1 mg (1 mg Intravenous  Given 07/03/24 0020)  ondansetron  (ZOFRAN ) injection 4 mg (4 mg Intravenous Given 07/03/24 0018)  ondansetron  (ZOFRAN ) injection 4 mg (4 mg Intravenous Given 07/03/24 0256)  pantoprazole  (PROTONIX ) injection 40 mg (40 mg Intravenous Given 07/03/24 0254)                                    Medical Decision Making Amount and/or Complexity of Data Reviewed Labs: ordered. Radiology: ordered.  Risk Prescription drug management. Decision regarding hospitalization.   Initial ECG does show some minor ST changes.  Pain improved.  Second EKG was done that showed increased ST elevations in inferior leads, to minutes later another 1 looks similar to the second 1.  More nitro was given here in the ER and his pain improved and his EKG abnormalities resolved completely.  Troponins are 26 and 31.  I did discuss with cardiology, Dr. Marty, after his second EKG and we decided not to activate a STEMI.  His D-dimer came back elevated.  I discussed with oncology team, Delon Hope about disconnecting the pump so we get a CT to rule out PE and she confirmed that that would be the appropriate course of action.  Started on heparin  after pharmacy consult secondary to possible PE versus possible ACS.  Cardiology recommends admission to [facility] with the medicine service with a cardiology consult but no Cath Lab activation at this point. PE scan negative for PE. Chest pain free. Most recent ecg reassuring. Will admit to medicine secondary to multiple comorbidities with cards consult on arrival to [facility].   Final diagnoses:  NSTEMI (non-ST elevated myocardial infarction) [facility])    ED Discharge Orders     None          Marcoantonio Legault, Mayo, MD 07/03/24 2322

## 2024-07-03 NOTE — H&P (Signed)
 History and Physical    Marvin Carr FMW:[id number] DOB: 18-Nov-1955 DOA: 07/02/2024  PCP: [name], MD   Patient coming from: Home   Chief Complaint: Chest pain   HPI: Marvin Carr is a 68 y.o. male with medical history significant for hypertension, depression, anxiety, insomnia, OSA on CPAP, CAD, and cholangiocarcinoma with liver lesions, peritoneal carcinomatosis, and malignant ascites who presents with chest pain.  Patient was watching TV when he developed chest pain.  He reports sharp pain localized to the central chest that worsened slowly.  This has been associated with nausea but no vomiting.  He took six 81 mg aspirin  and 2 nitroglycerin , and called EMS.  He feels that the nitroglycerin  may have helped slightly.  He had similar symptoms more than 10 years ago, but not since then.  ED Course: Upon arrival to the ED, patient is found to be afebrile and saturating low 90s on room air with normal HR and stable BP.  Labs are most notable for D-dimer 3.95, troponin 26, normal CBC, and normal LFTs.  CTA is negative for PE but notable for new small left pleural effusion with mild pleural thickening and consolidation, liver lesions consistent with known malignancy, and peritoneal soft tissue nodularity.  EKG demonstrates sinus rhythm with inferolateral ST changes.  Cardiology (Dr. Marty) was consulted by the ED physician, reviewed the EKGs, and recommended medical admission to [facility].  Patient was started on IV heparin  and treated with IV Protonix , Zofran  x 2, sublingual nitroglycerin  x 1, fentanyl , and Dilaudid .  Review of Systems:  All other systems reviewed and apart from HPI, are negative.  Past Medical History:  Diagnosis Date   Anxiety    Arthritis    Bladder cancer (HCC) 09/11/2009   CAD (coronary artery disease), native coronary artery    a. Mildly elevated troponin 03/2013, cath with nonobstructive disease including 50% AV groove distal stenosis before large  OM   Depression    Essential hypertension    Headache(784.0)    History of migraines   Hyperglycemia    Metastatic cancer to liver [facility])    Mixed hyperlipidemia    Neuropathy    Obesity    Port-A-Cath in place 09/30/2021   Pre-diabetes    Seasonal allergies    Sleep apnea    On CPAP    Past Surgical History:  Procedure Laterality Date   BIOPSY  09/23/2021   Procedure: BIOPSY;  Surgeon: Eartha Angelia Sieving, MD;  Location: [facility];  Service: Gastroenterology;;   COLONOSCOPY  06/28/2011   Procedure: COLONOSCOPY;  Surgeon: Claudis RAYMOND Rivet, MD;  Location: [facility];  Service: Endoscopy;  Laterality: N/A;   COLONOSCOPY WITH PROPOFOL  N/A 09/23/2021   Procedure: COLONOSCOPY WITH PROPOFOL ;  Surgeon: Eartha Angelia Sieving, MD;  Location: [facility];  Service: Gastroenterology;  Laterality: N/A;  940   ESOPHAGOGASTRODUODENOSCOPY (EGD) WITH PROPOFOL  N/A 09/23/2021   Procedure: ESOPHAGOGASTRODUODENOSCOPY (EGD) WITH PROPOFOL ;  Surgeon: Eartha Angelia Sieving, MD;  Location: [facility];  Service: Gastroenterology;  Laterality: N/A;   IR IMAGING GUIDED PORT INSERTION  09/28/2021   IR PARACENTESIS  09/28/2021   JOINT REPLACEMENT Right    hip   LEFT HEART CATHETERIZATION WITH CORONARY ANGIOGRAM N/A 03/31/2013   Procedure: LEFT HEART CATHETERIZATION WITH CORONARY ANGIOGRAM;  Surgeon: Lynwood Schilling, MD;  Location: [facility] CATH LAB;  Service: Cardiovascular;  Laterality: N/A;   POLYPECTOMY  09/23/2021   Procedure: POLYPECTOMY;  Surgeon: Eartha Angelia Sieving, MD;  Location: [facility];  Service: Gastroenterology;;   TOTAL HIP ARTHROPLASTY Left 01/15/2024   Procedure: ARTHROPLASTY, HIP, TOTAL, ANTERIOR APPROACH;  Surgeon: Ernie Cough, MD;  Location: [facility] ORS;  Service: Orthopedics;  Laterality: Left;   TURBT  09/11/2009    Social History:   reports that he quit smoking about 12 years ago. His smoking use included cigarettes. He started smoking about 23 years ago. He has a 5  pack-year smoking history. He has been exposed to tobacco smoke. He has never used smokeless tobacco. He reports current alcohol use. He reports that he does not use drugs.  No Known Allergies  Family History  Problem Relation Age of Onset   COPD Father      Prior to Admission medications   Medication Sig Start Date End Date Taking? Authorizing Provider  acetaminophen  (TYLENOL ) 500 MG tablet Take 2 tablets (1,000 mg total) by mouth every 6 (six) hours. Patient taking differently: Take 1,000 mg by mouth every 6 (six) hours as needed for mild pain (pain score 1-3). 01/16/24   Patti Rosina SAUNDERS, PA-C  atorvastatin  (LIPITOR) 10 MG tablet Take 10 mg by mouth daily. 06/09/24   [provider]  benzonatate  (TESSALON ) 200 MG capsule Take 1 capsule (200 mg total) by mouth 3 (three) times daily as needed for cough. 05/15/24   Davonna Siad, MD  bumetanide  (BUMEX ) 2 MG tablet Take 1 tablet (2 mg total) by mouth daily as needed. 05/21/24   Strader, Laymon HERO, PA-C  buPROPion  (WELLBUTRIN  XL) 300 MG 24 hr tablet Take 300 mg by mouth daily. 09/17/23   [provider]  cetirizine  (ZYRTEC ) 10 MG tablet Take 10 mg by mouth daily.    [provider]  cyclobenzaprine  (FLEXERIL ) 10 MG tablet Take 10 mg by mouth every 6 (six) hours as needed for muscle spasms. 01/25/24   [provider]  dapagliflozin propanediol (FARXIGA) 10 MG TABS tablet Take 10 mg by mouth daily.    [provider]  dexamethasone  (DECADRON ) 4 MG tablet Take 2 tablets (8 mg total) by mouth daily. Start the day after chemotherapy for 2 days. Take with food. 07/01/24   Davonna Siad, MD  docusate sodium (COLACE) 100 MG capsule Take 200 mg by mouth at bedtime.    [provider]  Durvalumab  (IMFINZI  IV) Inject into the vein every 21 ( twenty-one) days. 10/05/21   [provider]  Eszopiclone 3 MG TABS Take 3 mg by mouth at bedtime.    [provider]  famotidine  (PEPCID ) 20 MG  tablet Take 20 mg by mouth at bedtime. 09/17/23   [provider]  fenofibrate  160 MG tablet Take 160 mg by mouth daily. 11/05/19   [provider]  FLUoxetine  (PROZAC ) 10 MG capsule Take 10 mg by mouth daily. 09/14/23   [provider]  guaiFENesin (MUCINEX) 600 MG 12 hr tablet Take 600 mg by mouth 2 (two) times daily as needed for cough or to loosen phlegm.    [provider]  lidocaine  (XYLOCAINE ) 2 % solution  05/23/24   [provider]  lidocaine -prilocaine  (EMLA ) cream Apply to affected area once 07/01/24   Kandala, Hyndavi, MD  magic mouthwash (lidocaine , diphenhydrAMINE , alum & mag hydroxide) suspension Swish and swallow 5 mLs 3 (three) times daily as needed for mouth pain. 05/23/24   Geofm Delon BRAVO, NP  magnesium  oxide (MAG-OX) 400 (240 Mg) MG tablet Take 1 tablet (400 mg total) by mouth 2 (two) times daily. 11/16/21   Katragadda, Sreedhar, MD  metFORMIN  (GLUCOPHAGE -XR)  500 MG 24 hr tablet Take 500 mg by mouth 2 (two) times daily. 08/16/21   [provider]  Multiple Vitamin (MULTIVITAMIN WITH MINERALS) TABS tablet Take 1 tablet by mouth daily.    [provider]  nitroGLYCERIN  (NITROSTAT ) 0.4 MG SL tablet Place 0.4 mg under the tongue every 5 (five) minutes x 3 doses as needed for chest pain (if no relief after 2nd dose, proceed to ED or call 911).    [provider]  olmesartan  (BENICAR ) 40 MG tablet TAKE ONE TABLET BY MOUTH EVERY DAY 01/27/24   Rogers Hai, MD  omeprazole (PRILOSEC) 40 MG capsule Take 40 mg by mouth daily as needed (heartburn, acid reflux). 01/25/24   [provider]  ondansetron  (ZOFRAN ) 8 MG tablet Take 1 tablet (8 mg total) by mouth every 8 (eight) hours as needed for nausea or vomiting. Start on the third day after chemotherapy. 07/01/24   Kandala, Hyndavi, MD  pramipexole  (MIRAPEX ) 0.75 MG tablet Take 1 tablet (0.75 mg total) by mouth 3 (three) times daily. Patient taking differently:  Take 0.75 mg by mouth in the morning, at noon, in the evening, and at bedtime. 08/30/22   Rogers Hai, MD  prochlorperazine  (COMPAZINE ) 10 MG tablet Take 1 tablet (10 mg total) by mouth every 6 (six) hours as needed for nausea or vomiting. 05/15/24   Kandala, Hyndavi, MD  prochlorperazine  (COMPAZINE ) 10 MG tablet Take 1 tablet (10 mg total) by mouth every 6 (six) hours as needed for nausea or vomiting. 07/01/24   Davonna Siad, MD  Semaglutide  14 MG TABS Take 14 mg by mouth daily. 08/16/21   [provider]  tamsulosin  (FLOMAX ) 0.4 MG CAPS capsule Take 0.4 mg by mouth daily. 08/29/21   [provider]  traMADol  (ULTRAM ) 50 MG tablet Take 50 mg by mouth at bedtime as needed for severe pain (pain score 7-10).    [provider]  traZODone  (DESYREL ) 100 MG tablet Take 1 tablet (100 mg total) by mouth at bedtime. 06/25/24   Davonna Siad, MD    Physical Exam: Vitals:   07/03/24 0530 07/03/24 0545 07/03/24 0600 07/03/24 0615  BP: (!) 140/84 110/68 97/69 103/72  Pulse: 84 85 84 83  Resp: (!) 21 20 18 19   Temp:      TempSrc:      SpO2: 95% 95% 95% 96%  Weight:      Height:        Constitutional: NAD, calm  Eyes: PERTLA, lids and conjunctivae normal ENMT: Mucous membranes are moist. Posterior pharynx clear of any exudate or lesions.   Neck: supple, no masses  Respiratory: no wheezing, no crackles. No accessory muscle use.  Cardiovascular: S1 & S2 heard, regular rate and rhythm. No extremity edema.   Abdomen: Soft, mild distension, non-tender. Bowel sounds active.  Musculoskeletal: no clubbing / cyanosis. No joint deformity upper and lower extremities.   Skin: no significant rashes, lesions, ulcers. Warm, dry, well-perfused. Neurologic: CN 2-12 grossly intact. Moving all extremities. Alert and oriented.  Psychiatric: Pleasant. Cooperative.    Labs and Imaging on Admission: I have personally reviewed following labs and imaging studies  CBC: Recent  Labs  Lab 07/01/24 0805 07/03/24 0023  WBC 6.5 5.7  NEUTROABS 4.4 4.2  HGB 11.5* 13.1  HCT 36.3* 41.4  MCV 92.6 92.6  PLT 313 278   Basic Metabolic Panel: Recent Labs  Lab 07/01/24 0805 07/03/24 0023  NA 134* 138  K 3.9 4.4  CL 97* 99  CO2  26 27  GLUCOSE 122* 131*  BUN 22 24*  CREATININE 1.02 1.07  CALCIUM  9.5 10.0  MG 1.9  --    GFR: Estimated Creatinine Clearance: 88.6 mL/min (by C-G formula based on SCr of 1.07 mg/dL). Liver Function Tests: Recent Labs  Lab 07/01/24 0805 07/03/24 0023  AST 20 30  ALT 10 16  ALKPHOS 106 112  BILITOT 0.4 0.3  PROT 6.8 7.5  ALBUMIN  3.6 4.0   No results for input(s): LIPASE, AMYLASE in the last 168 hours. No results for input(s): AMMONIA in the last 168 hours. Coagulation Profile: No results for input(s): INR, PROTIME in the last 168 hours. Cardiac Enzymes: No results for input(s): CKTOTAL, CKMB, CKMBINDEX, TROPONINI in the last 168 hours. BNP (last 3 results) Recent Labs    07/03/24 0023  PROBNP 218.0   HbA1C: No results for input(s): HGBA1C in the last 72 hours. CBG: No results for input(s): GLUCAP in the last 168 hours. Lipid Profile: No results for input(s): CHOL, HDL, LDLCALC, TRIG, CHOLHDL, LDLDIRECT in the last 72 hours. Thyroid  Function Tests: No results for input(s): TSH, T4TOTAL, FREET4, T3FREE, THYROIDAB in the last 72 hours. Anemia Panel: No results for input(s): VITAMINB12, FOLATE, FERRITIN, TIBC, IRON, RETICCTPCT in the last 72 hours. Urine analysis:    Component Value Date/Time   COLORURINE YELLOW 05/15/2024 1719   APPEARANCEUR CLEAR 05/15/2024 1719   LABSPEC 1.036 (H) 05/15/2024 1719   PHURINE 6.0 05/15/2024 1719   GLUCOSEU NEGATIVE 05/15/2024 1719   HGBUR NEGATIVE 05/15/2024 1719   BILIRUBINUR NEGATIVE 05/15/2024 1719   KETONESUR NEGATIVE 05/15/2024 1719   PROTEINUR NEGATIVE 05/15/2024 1719   NITRITE NEGATIVE 05/15/2024 1719    LEUKOCYTESUR NEGATIVE 05/15/2024 1719   Sepsis Labs: @LABRCNTIP (procalcitonin:4,lacticidven:4) )No results found for this or any previous visit (from the past 240 hours).   Radiological Exams on Admission: CT Angio Chest PE W and/or Wo Contrast Result Date: 07/03/2024 EXAM: CTA of the Chest with contrast for PE 07/03/2024 04:45:48 AM TECHNIQUE: CTA of the chest was performed without and with the administration of intravenous contrast. 75 mL of iohexol  (OMNIPAQUE ) 350 MG/ML injection. Multiplanar reformatted images are provided for review. MIP images are provided for review. Automated exposure control, iterative reconstruction, and/or weight based adjustment of the mA/kV was utilized to reduce the radiation dose to as low as reasonably achievable. COMPARISON: 05/15/2024 CLINICAL HISTORY: Pulmonary embolism (PE) suspected, high prob. Cc center cp 3 hours Nausea no vomiting. Stabbing pain increased from 5 to 10/10 170/110 HR 78 FINDINGS: PULMONARY ARTERIES: Pulmonary arteries are adequately opacified for evaluation. No pulmonary embolism. Main pulmonary artery is normal in caliber. MEDIASTINUM: The heart and pericardium demonstrate coronary artery calcifications. Aortic atherosclerosis is present. LYMPH NODES: No mediastinal, hilar or axillary lymphadenopathy. LUNGS AND PLEURA: Atelectasis and subpleural consolidation change. New small left pleural effusion with overlying mild pleural thickening overlying the posterior right base. No pneumothorax. UPPER ABDOMEN: Corresponding to known malignancy suboptimally visualized reflecting phase of contrast enhancement. Index lesion within the anterior left lobe of liver measures approximately 1.8 x 1.5 cm image 114/4. Previously 1.5 x 1.0 cm. Peritoneal soft tissue nodularity is identified compared peritoneal thickening within the left upper quadrant of the abdomen with adjacent nodularity compatible with known peritoneal carcinomatosis. SOFT TISSUES AND BONES: No  acute or suspicious osseous findings. No acute soft tissue abnormality. IMPRESSION: 1. No evidence of pulmonary embolism. 2. New small left pleural effusion with overlying mild pleural thickening, consolidation and atelectasis. 3. Multifocal hypodense liver lesions compatible with known  malignancy. Suboptimally visualized on the current exam due to phase of contrast opacification. 4. Peritoneal soft tissue nodularity compatible with peritoneal carcinomatosis. 5. Aortic atherosclerosis and coronary artery calcifications. Electronically signed by: Waddell Calk MD 07/03/2024 05:14 AM EDT RP Workstation: [id number]   DG Chest Portable 1 View Result Date: 07/03/2024 EXAM: 1 VIEW(S) XRAY OF THE CHEST 07/03/2024 12:27:00 AM COMPARISON: 06/01/2023 CLINICAL HISTORY: eval for pneumonia/chest pain. eval for pneumonia/chest pain - slight dizziness upon standing, mid AP region chest pressure, slight cough FINDINGS: LINES, TUBES AND DEVICES: Right chest port with tip in SVC. LUNGS AND PLEURA: Left basilar opacities. New small left pleural effusion. No pulmonary edema. No pneumothorax. HEART AND MEDIASTINUM: No acute abnormality of the cardiac and mediastinal silhouettes. BONES AND SOFT TISSUES: No acute osseous abnormality. IMPRESSION: 1. New small left pleural effusion with left basilar atelectasis or pneumonia. Electronically signed by: Norman Gatlin MD 07/03/2024 12:33 AM EDT RP Workstation: [id number]    EKG: Independently reviewed. Sinus rhythm.   Assessment/Plan   1. Chest pain   - Onset of central chest pain at rest, later developed jaw ache - Took 486 mg ASA and 2 SL NTG at home with slight improvement - Troponin 26 -> 31; EKG: SR, minimal anterolateral ST elevations developed when pain increased in ED  - Cardiology recommends medical admission to [facility]  - Continue cardiac monitoring, continue IV heparin , ASA 81 daily, Lipitor, trend troponin, follow-up on cardiology recommendations   2.  Cholangiocarcinoma  - With liver lesions, peritoneal carcinomatosis, and malignant ascites  - Progressed despite first-line treatment, started FOLFOX this week under the care of Dr. Davonna    3. Hypertension  - Continue ARB    4. OSA  - Continue CPAP while sleeping   5. Depression, anxiety  - Continue Prozac  and Wellbutrin     6. RLS  - Mirapex     DVT prophylaxis: IV heparin   Code Status: Full  Level of Care: Level of care: Progressive Family Communication: None present  Disposition Plan:  Patient is from: Home  Anticipated d/c is to: TBD Anticipated d/c date is: 07/06/24  Patient currently: Pending cardiology consultation, clinical course Consults called: Cardiology  Admission status: Inpatient     Evalene GORMAN Sprinkles, MD [facility]  07/03/2024, 6:26 AM

## 2024-07-03 NOTE — ED Notes (Signed)
 pt is having 10/10 back pain. Pt states it has never been this bad. repositioned pt in bed, gave 4 mg of morphine . pts monitor does show some st elevation. MD [facility] notified.

## 2024-07-03 NOTE — TOC Initial Note (Signed)
 Transition of Care [facility]) - Initial/Assessment Note    Patient Details  Name: Marvin Carr MRN: [id number] Date of Birth: [birth year]  Transition of Care [facility]) CM/SW Contact:    Lucie Lunger, LCSWA Phone Number: 07/03/2024, 10:23 AM  Clinical Narrative:                 Pt is high risk for readmission. CSW spoke with pt to complete assessment. Pt states he lives alone and is independent at baseline. Pt states he is able to drive to appointments when needed. Pt states he has not had HH in the past. Pt states that he has a cane and walker to use if and when needed. Pt is currently on 2L O2 and states that he does not wear O2 at home. TOC to follow.   Expected Discharge Plan: Home/Self Care Barriers to Discharge: Continued Medical Work up   Patient Goals and CMS Choice Patient states their goals for this hospitalization and ongoing recovery are:: return home CMS Medicare.gov Compare Post Acute Care list provided to:: Patient Choice offered to / list presented to : Patient      Expected Discharge Plan and Services In-house Referral: Clinical Social Work Discharge Planning Services: CM Consult   Living arrangements for the past 2 months: Single Family Home                                      Prior Living Arrangements/Services Living arrangements for the past 2 months: Single Family Home Lives with:: Self Patient language and need for interpreter reviewed:: Yes Do you feel safe going back to the place where you live?: Yes      Need for Family Participation in Patient Care: Yes (Comment) Care giver support system in place?: Yes (comment) Current home services: DME (cane and walker) Criminal Activity/Legal Involvement Pertinent to Current Situation/Hospitalization: No - Comment as needed  Activities of Daily Living      Permission Sought/Granted                  Emotional Assessment Appearance:: Appears stated age Attitude/Demeanor/Rapport:  Engaged Affect (typically observed): Accepting Orientation: : Oriented to Self, Oriented to Place, Oriented to Situation, Oriented to  Time Alcohol / Substance Use: Not Applicable Psych Involvement: No (comment)  Admission diagnosis:  Chest pain [R07.9] Patient Active Problem List   Diagnosis Date Noted   Chest pain 07/03/2024   Anxiety    IDA (iron deficiency anemia) 05/09/2024   Malignant ascites (HCC) 05/09/2024   Dry cough 05/09/2024   Hypokalemia 05/09/2024   Hypomagnesemia 05/09/2024   Insomnia 05/09/2024   S/p revision of left total hip 01/15/2024   S/P total left hip arthroplasty 01/15/2024   DOE (dyspnea on exertion) 09/25/2023   Chemotherapy-induced peripheral neuropathy 07/03/2023   Nocturnal hypoxia 04/06/2023   Neuropathy associated with cancer (HCC) 02/22/2023   Edema due to hypoalbuminemia 02/22/2023   RLS (restless legs syndrome) 02/22/2023   History of insomnia 02/22/2023   History of restless legs syndrome 02/22/2023   Chronic skin ulcer (HCC) 02/22/2023   OSA (obstructive sleep apnea) 01/29/2023   Fever 06/04/2022   Sepsis due to cellulitis (HCC) 06/04/2022   Morbid obesity due to excess calories (HCC)    Depression    Pre-diabetes    Forgetfulness 03/29/2022   Port-A-Cath in place 09/30/2021   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   Lesion of liver 09/19/2021  Peritoneal lesion 09/19/2021   Constipation 09/19/2021   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   Degeneration of intervertebral disc of lumbar region 09/01/2021   Neuropathy    CAD (coronary artery disease), native coronary artery 03/30/2013   Essential hypertension 03/30/2013   Mixed hyperlipidemia 03/30/2013   PCP:  [name], MD Pharmacy:   [facility] [place], [place] - [address] [place] [address] Phone: [phone] Fax: [phone]     Social Drivers of Health (SDOH) Social History: SDOH Screenings    Food Insecurity: No Food Insecurity (01/15/2024)  Housing: Low Risk  (01/15/2024)  Transportation Needs: No Transportation Needs (01/15/2024)  Utilities: Not At Risk (01/15/2024)  Depression (PHQ2-9): Low Risk  (07/01/2024)  Recent Concern: Depression (PHQ2-9) - Medium Risk (05/13/2024)  Social Connections: Moderately Isolated (01/15/2024)  Tobacco Use: Medium Risk (07/02/2024)   SDOH Interventions:     Readmission Risk Interventions    07/03/2024   10:21 AM 06/05/2022    8:38 AM  Readmission Risk Prevention Plan  Transportation Screening Complete Complete  Home Care Screening  Complete  Medication Review (RN CM)  Complete  HRI or Home Care Consult Complete   Social Work Consult for Recovery Care Planning/Counseling Complete   Palliative Care Screening Not Applicable   Medication Review Oceanographer) Complete

## 2024-07-04 ENCOUNTER — Encounter (HOSPITAL_COMMUNITY): Admission: EM | Disposition: A | Discharge status: Home / Self Care | Payer: Self-pay | Source: Home / Self Care

## 2024-07-04 ENCOUNTER — Other Ambulatory Visit: Payer: Self-pay

## 2024-07-04 ENCOUNTER — Ambulatory Visit (HOSPITAL_COMMUNITY): Admit: 2024-07-04 | Admitting: Internal Medicine

## 2024-07-04 DIAGNOSIS — R079 Chest pain, unspecified: Secondary | ICD-10-CM | POA: Diagnosis not present

## 2024-07-04 DIAGNOSIS — R072 Precordial pain: Secondary | ICD-10-CM

## 2024-07-04 DIAGNOSIS — I10 Essential (primary) hypertension: Secondary | ICD-10-CM | POA: Diagnosis not present

## 2024-07-04 DIAGNOSIS — I25110 Atherosclerotic heart disease of native coronary artery with unstable angina pectoris: Secondary | ICD-10-CM | POA: Diagnosis not present

## 2024-07-04 HISTORY — PX: LEFT HEART CATH AND CORONARY ANGIOGRAPHY: CATH118249

## 2024-07-04 HISTORY — PX: CORONARY STENT INTERVENTION: CATH118234

## 2024-07-04 LAB — CBC
HCT: 36.3 % — ABNORMAL LOW (ref 39.0–52.0)
Hemoglobin: 11.1 g/dL — ABNORMAL LOW (ref 13.0–17.0)
MCH: 28.9 pg (ref 26.0–34.0)
MCHC: 30.6 g/dL (ref 30.0–36.0)
MCV: 94.5 fL (ref 80.0–100.0)
Platelets: 215 K/uL (ref 150–400)
RBC: 3.84 MIL/uL — ABNORMAL LOW (ref 4.22–5.81)
RDW: 17 % — ABNORMAL HIGH (ref 11.5–15.5)
WBC: 4.3 K/uL (ref 4.0–10.5)
nRBC: 0 % (ref 0.0–0.2)

## 2024-07-04 LAB — BASIC METABOLIC PANEL WITH GFR
Anion gap: 13 (ref 5–15)
BUN: 20 mg/dL (ref 8–23)
CO2: 20 mmol/L — ABNORMAL LOW (ref 22–32)
Calcium: 8.7 mg/dL — ABNORMAL LOW (ref 8.9–10.3)
Chloride: 100 mmol/L (ref 98–111)
Creatinine, Ser: 0.94 mg/dL (ref 0.61–1.24)
GFR, Estimated: >60 mL/min (ref >60)
Glucose, Bld: 124 mg/dL — ABNORMAL HIGH (ref 70–99)
Potassium: 4.1 mmol/L (ref 3.5–5.1)
Sodium: 133 mmol/L — ABNORMAL LOW (ref 135–145)

## 2024-07-04 LAB — LIPID PANEL
Cholesterol: 79 mg/dL (ref 0–200)
HDL: 34 mg/dL — ABNORMAL LOW (ref >40)
LDL Cholesterol: 22 mg/dL (ref 0–99)
Total CHOL/HDL Ratio: 2.3 ratio
Triglycerides: 115 mg/dL (ref <150)
VLDL: 23 mg/dL (ref 0–40)

## 2024-07-04 LAB — HEPARIN LEVEL (UNFRACTIONATED): Heparin Unfractionated: 0.42 [IU]/mL (ref 0.30–0.70)

## 2024-07-04 LAB — HIV ANTIBODY (ROUTINE TESTING W REFLEX): HIV Screen 4th Generation wRfx: NONREACTIVE

## 2024-07-04 LAB — MRSA NEXT GEN BY PCR, NASAL: MRSA by PCR Next Gen: NOT DETECTED

## 2024-07-04 MED ORDER — SODIUM CHLORIDE 0.9% FLUSH: 3.0000 mL | INTRAVENOUS | Status: DC | PRN

## 2024-07-04 MED ORDER — SODIUM CHLORIDE 0.9 % IV SOLN: 250.0000 mL | INTRAVENOUS | Status: AC | PRN

## 2024-07-04 MED ORDER — LIDOCAINE HCL (PF) 1 % IJ SOLN
INTRAMUSCULAR | Status: DC | PRN
  Administered 2024-07-04: 2 mL via INTRADERMAL

## 2024-07-04 MED ORDER — IVABRADINE HCL 5 MG PO TABS
15.0000 mg | ORAL_TABLET | Freq: Once | ORAL | Status: AC
  Administered 2024-07-04: 15 mg via ORAL
  Filled 2024-07-04: qty 3

## 2024-07-04 MED ORDER — SODIUM CHLORIDE 0.9% FLUSH: 10.0000 mL | INTRAVENOUS | Status: DC | PRN

## 2024-07-04 MED ORDER — SODIUM CHLORIDE 0.9% FLUSH
10.0000 mL | Freq: Two times a day (BID) | INTRAVENOUS | Status: DC
  Administered 2024-07-04 (×2): 10 mL
  Administered 2024-07-05: 20 mL
  Administered 2024-07-06 – 2024-07-07 (×2): 10 mL

## 2024-07-04 MED ORDER — FREE WATER: 250.0000 mL | Freq: Once | Status: DC

## 2024-07-04 MED ORDER — VERAPAMIL HCL 2.5 MG/ML IV SOLN
INTRAVENOUS | Status: AC
  Filled 2024-07-04: qty 2

## 2024-07-04 MED ORDER — CHLORHEXIDINE GLUCONATE CLOTH 2 % EX PADS
6.0000 | MEDICATED_PAD | Freq: Every day | CUTANEOUS | Status: DC
  Administered 2024-07-04 – 2024-07-07 (×4): 6 via TOPICAL

## 2024-07-04 MED ORDER — FREE WATER
500.0000 mL | Freq: Once | Status: AC
  Administered 2024-07-04: 500 mL via ORAL

## 2024-07-04 MED ORDER — HEPARIN SODIUM (PORCINE) 1000 UNIT/ML IJ SOLN
INTRAMUSCULAR | Status: DC | PRN
  Administered 2024-07-04: 5000 [IU] via INTRA_ARTERIAL

## 2024-07-04 MED ORDER — SODIUM CHLORIDE 0.9% FLUSH
3.0000 mL | Freq: Two times a day (BID) | INTRAVENOUS | Status: DC
  Administered 2024-07-04 – 2024-07-07 (×5): 3 mL via INTRAVENOUS

## 2024-07-04 MED ORDER — VERAPAMIL HCL 2.5 MG/ML IV SOLN
INTRAVENOUS | Status: DC | PRN
  Administered 2024-07-04: 5 mg via INTRA_ARTERIAL

## 2024-07-04 MED ORDER — TICAGRELOR 90 MG PO TABS
90.0000 mg | ORAL_TABLET | Freq: Two times a day (BID) | ORAL | Status: DC
  Administered 2024-07-04 – 2024-07-07 (×6): 90 mg via ORAL
  Filled 2024-07-04 (×6): qty 1

## 2024-07-04 MED ORDER — HEPARIN (PORCINE) IN NACL 1000-0.9 UT/500ML-% IV SOLN
INTRAVENOUS | Status: DC | PRN
  Administered 2024-07-04 (×2): 500 mL

## 2024-07-04 MED ORDER — TICAGRELOR 90 MG PO TABS
ORAL_TABLET | ORAL | Status: DC | PRN
  Administered 2024-07-04: 180 mg via ORAL

## 2024-07-04 MED ORDER — ASPIRIN 81 MG PO CHEW: 81.0000 mg | CHEWABLE_TABLET | Freq: Every day | ORAL | Status: DC

## 2024-07-04 MED ORDER — IOHEXOL 350 MG/ML SOLN
INTRAVENOUS | Status: DC | PRN
  Administered 2024-07-04: 80 mL

## 2024-07-04 MED ORDER — HEPARIN SODIUM (PORCINE) 1000 UNIT/ML IJ SOLN
INTRAMUSCULAR | Status: AC
  Filled 2024-07-04: qty 10

## 2024-07-04 MED ORDER — LABETALOL HCL 5 MG/ML IV SOLN: 10.0000 mg | INTRAVENOUS | Status: AC | PRN

## 2024-07-04 MED ORDER — TICAGRELOR 90 MG PO TABS
ORAL_TABLET | ORAL | Status: AC
Start: 2024-07-04 — End: 2024-07-04
  Filled 2024-07-04: qty 2

## 2024-07-04 MED ORDER — HYDRALAZINE HCL 20 MG/ML IJ SOLN: 10.0000 mg | INTRAMUSCULAR | Status: AC | PRN

## 2024-07-04 NOTE — Progress Notes (Signed)
   07/04/24 2237  BiPAP/CPAP/SIPAP  BiPAP/CPAP/SIPAP Pt Type Adult  Reason BIPAP/CPAP not in use Non-compliant (pt refuses)  BiPAP/CPAP /SiPAP Vitals  Pulse Rate 78  SpO2 92 %  Bilateral Breath Sounds Clear;Diminished

## 2024-07-04 NOTE — Progress Notes (Signed)
 Mobility Specialist Progress Note:    07/04/24 1453  Mobility  Activity Ambulated with assistance  Level of Assistance Modified independent, requires aide device or extra time  Assistive Device None (furniture)  Distance Ambulated (ft) 15 ft  Activity Response Tolerated well  Mobility Referral Yes  Mobility visit 1 Mobility  Mobility Specialist Start Time (ACUTE ONLY) 1445  Mobility Specialist Stop Time (ACUTE ONLY) 1453  Mobility Specialist Time Calculation (min) (ACUTE ONLY) 8 min   Pt received in bed, requesting to use bathroom. Transport in room. Deferred ambulating in hallway at this time d/t s/p. Returned pt to bed, all needs met. Left in care of transport.   Azalea Cedar Mobility Specialist Please contact via Special educational needs teacher or  Rehab office at [phone]

## 2024-07-04 NOTE — Plan of Care (Signed)
  Problem: Education: Goal: Understanding of cardiac disease, CV risk reduction, and recovery process will improve Outcome: Progressing   Problem: Health Behavior/Discharge Planning: Goal: Ability to safely manage health-related needs after discharge will improve Outcome: Progressing   Problem: Education: Goal: Knowledge of General Education information will improve Description: Including pain rating scale, medication(s)/side effects and non-pharmacologic comfort measures Outcome: Progressing   Problem: Pain Managment: Goal: General experience of comfort will improve and/or be controlled Outcome: Progressing

## 2024-07-04 NOTE — Progress Notes (Addendum)
 Progress Note  Patient Name: [facility] Date of Encounter: 07/04/2024 [facility] Cardiologist: Jayson Sierras, MD   Interval Summary    Had some chest pain this morning. Reports symptoms have been intermittent over the past couple of weeks, present with rest and exertion. Also with intermittent dry cough.   Vital Signs Vitals:   07/04/24 0400 07/04/24 0500 07/04/24 0600 07/04/24 0800  BP: 94/65 100/69 97/68 114/77  Pulse:    93  Resp: 17 15 20 20   Temp: 97.7 F (36.5 C)   (!) 97.5 F (36.4 C)  TempSrc: Oral   Oral  SpO2: 98% 97% 97% 94%  Weight:      Height:        Intake/Output Summary (Last 24 hours) at 07/04/2024 0923 Last data filed at 07/04/2024 0840 Gross per 24 hour  Intake 947.74 ml  Output 600 ml  Net 347.74 ml      07/02/2024   11:38 PM 07/01/2024    8:01 AM 06/24/2024    7:51 AM  Last 3 Weights  Weight (lbs) 272 lb 14.9 oz 272 lb 14.9 oz 280 lb 10.3 oz  Weight (kg) 123.8 kg 123.8 kg 127.3 kg      Telemetry/ECG   Sinus Rhythm, rates 90s - Personally Reviewed  Physical Exam  GEN: No acute distress.   Neck: No JVD Cardiac: RRR, no murmurs, rubs, or gallops.  Respiratory: Clear to auscultation bilaterally. GI: Soft, nontender, non-distended  MS: No edema  Assessment & Plan   68 y.o. 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 Ricky.   Chest pain CAD -- mild non-obstructive disease in 2014 -- Presented to [facility] with complaints of intermittent chest heaviness/sharp stabbing type pain intermittent over  the past several weeks.  Was given aspirin  and nitro with no significant relief.  -- High-sensitivity troponin 26>>31>>45>>59, EKG nonischemic. Initially on IV heparin , will DC this morning -- Seen by Dr. Mallipeddi at [facility] in transfer to [facility] with plans for CCTa -- echo 10/23 LVEF 60-65%, g1DD, normal RV, no significant valvular disease  -- HR in the 90s this morning, BP soft. Will order ivabradine and follow HRs. 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 I have personally performed an evaluation on this patient.  My findings are as follows: 68 y.o. male with CAD, HTN and metastatic cancer admitted with chest pain. Troponin flat and mildly abnormal.  Mild chest pain this am. Plans per team at [facility] for coronary CTA today.   Data: EKG(s) and pertinent labs, studies, etc were personally reviewed and interpreted by  me:  Sinus Tele: sinus Labs reviewed by me Otherwise, I agree with data as outlined by the advanced practice provider.  Exam performed by me: Gen: NAD Neck: No JVD Cardiac: RRR Lungs: clear bilaterally Extremities: no LE edema  My Assessment and Plan:  Chest pain in patient with known mild CAD by cath in 2014. EKG without ischemic changes. No significant troponin elevation. Plans for coronary CTA today per Dr. Mallipeddi and team at [facility] yesterday. If there is obstructive CAD on scan, will need a  cardiac cath.  Will stop IV heparin  Continue ASA  Signed,  Lonni Cash, MD  07/04/2024 10:21 AM   Addendum:  Pt unable to complete the coronary CTA today due soft BP and inability to control his heart rate for the scan. (This was arranged by our team at [facility]) He is having chest pain concerning for unstable angina and has a history of CAD. I think cardiac cath is indicated.  Will plan cardiac cath today.   I have reviewed the risks, indications, and alternatives to cardiac catheterization, possible angioplasty, and stenting with the patient. Risks include but are not limited to bleeding, infection, vascular injury, stroke, myocardial infection, arrhythmia, kidney injury, radiation-related injury in the case of prolonged fluoroscopy use, emergency cardiac surgery, and death. The patient understands the risks of serious complication is 1-2 in 1000 with diagnostic cardiac cath and 1-2% or less with angioplasty/stenting.  Lonni Cash, MD, FACC 07/04/2024 1:49 PM

## 2024-07-04 NOTE — Progress Notes (Signed)
 PHARMACY - ANTICOAGULATION CONSULT NOTE  Pharmacy Consult for heparin  Indication: r/o ACS  No Known Allergies  Patient Measurements: Height: 6' (182.9 cm) Weight: 123.8 kg (272 lb 14.9 oz) IBW/kg (Calculated) : 77.6 HEPARIN  DW (KG): 105  Vital Signs: Temp: 97.5 F (36.4 C) (10/24 0800) Temp Source: Oral (10/24 0800) BP: 114/77 (10/24 0800) Pulse Rate: 93 (10/24 0800)  Labs: Recent Labs    07/03/24 0023 07/03/24 0937 07/03/24 1804 07/04/24 0309  HGB 13.1  --   --  11.1*  HCT 41.4  --   --  36.3*  PLT 278  --   --  215  HEPARINUNFRC  --  0.36 0.30 0.42  CREATININE 1.07  --   --  0.94    Estimated Creatinine Clearance: 100.8 mL/min (by C-G formula based on SCr of 0.94 mg/dL).   Medical History: Past Medical History:  Diagnosis Date   Anxiety    Arthritis    Bladder cancer (HCC) 09/11/2009   CAD (coronary artery disease), native coronary artery    a. Mildly elevated troponin 03/2013, cath with nonobstructive disease including 50% AV groove distal stenosis before large OM   Depression    Essential hypertension    Headache(784.0)    History of migraines   Hyperglycemia    Metastatic cancer to liver (HCC)    Mixed hyperlipidemia    Neuropathy    Obesity    Port-A-Cath in place 09/30/2021   Pre-diabetes    Seasonal allergies    Sleep apnea    On CPAP    Assessment: 68yo male c/o stabbing CP which started to radiate to jaw a few hours after presentation to ED, initial troponins mildly elevated but D-dimer also elevated >> to begin heparin  for ACS. CTA was negative for PE.  Heparin  level is therapeutic at 0.4 on heparin  1900 uts/hr. No bleeding or IV issues  Cbc stable flat trop CT negative for PE planning cardiac CT  Goal of Therapy:  Heparin  level 0.3-0.7 units/ml Monitor platelets by anticoagulation protocol: Yes   Plan:  Stop heparin  drip    Olam Chalk Pharm.D. CPP, BCPS Clinical Pharmacist [phone] 07/04/2024 9:26 AM

## 2024-07-04 NOTE — H&P (View-Only) (Signed)
 Progress Note  Patient Name: Marvin [facility] Date of Encounter: 07/04/2024 [facility] Cardiologist: Marvin Sierras, MD   Interval Summary    Had some chest pain this morning. Reports symptoms have been intermittent over the past couple of weeks, present with rest and exertion. Also with intermittent dry cough.   Vital Signs Vitals:   07/04/24 0400 07/04/24 0500 07/04/24 0600 07/04/24 0800  BP: 94/65 100/69 97/68 114/77  Pulse:    93  Resp: 17 15 20 20   Temp: 97.7 F (36.5 C)   (!) 97.5 F (36.4 C)  TempSrc: Oral   Oral  SpO2: 98% 97% 97% 94%  Weight:      Height:        Intake/Output Summary (Last 24 hours) at 07/04/2024 0923 Last data filed at 07/04/2024 0840 Gross per 24 hour  Intake 947.74 ml  Output 600 ml  Net 347.74 ml      07/02/2024   11:38 PM 07/01/2024    8:01 AM 06/24/2024    7:51 AM  Last 3 Weights  Weight (lbs) 272 lb 14.9 oz 272 lb 14.9 oz 280 lb 10.3 oz  Weight (kg) 123.8 kg 123.8 kg 127.3 kg      Telemetry/ECG   Sinus Rhythm, rates 90s - Personally Reviewed  Physical Exam  GEN: No acute distress.   Neck: No JVD Cardiac: RRR, no murmurs, rubs, or gallops.  Respiratory: Clear to auscultation bilaterally. GI: Soft, nontender, non-distended  MS: No edema  Assessment & Plan   68 y.o. 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 Ricky.   Chest pain CAD -- mild non-obstructive disease in 2014 -- Presented to [facility] with complaints of intermittent chest heaviness/sharp stabbing type pain intermittent over  the past several weeks.  Was given aspirin  and nitro with no significant relief.  -- High-sensitivity troponin 26>>31>>45>>59, EKG nonischemic. Initially on IV heparin , will DC this morning -- Seen by Dr. Mallipeddi at [facility] in transfer to [facility] with plans for CCTa -- echo 10/23 LVEF 60-65%, g1DD, normal RV, no significant valvular disease  -- HR in the 90s this morning, BP soft. Will order ivabradine and follow HRs. 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 I have personally performed an evaluation on this patient.  My findings are as follows: 68 y.o. male with CAD, HTN and metastatic cancer admitted with chest pain. Troponin flat and mildly abnormal.  Mild chest pain this am. Plans per team at [facility] for coronary CTA today.   Data: EKG(s) and pertinent labs, studies, etc were personally reviewed and interpreted by  me:  Sinus Tele: sinus Labs reviewed by me Otherwise, I agree with data as outlined by the advanced practice provider.  Exam performed by me: Gen: NAD Neck: No JVD Cardiac: RRR Lungs: clear bilaterally Extremities: no LE edema  My Assessment and Plan:  Chest pain in patient with known mild CAD by cath in 2014. EKG without ischemic changes. No significant troponin elevation. Plans for coronary CTA today per Dr. Mallipeddi and team at [facility] yesterday. If there is obstructive CAD on scan, will need a  cardiac cath.  Will stop IV heparin  Continue ASA  Signed,  Marvin Cash, MD  07/04/2024 10:21 AM   Addendum:  Pt unable to complete the coronary CTA today due soft BP and inability to control his heart rate for the scan. (This was arranged by our team at [facility]) He is having chest pain concerning for unstable angina and has a history of CAD. I think cardiac cath is indicated.  Will plan cardiac cath today.   I have reviewed the risks, indications, and alternatives to cardiac catheterization, possible angioplasty, and stenting with the patient. Risks include but are not limited to bleeding, infection, vascular injury, stroke, myocardial infection, arrhythmia, kidney injury, radiation-related injury in the case of prolonged fluoroscopy use, emergency cardiac surgery, and death. The patient understands the risks of serious complication is 1-2 in 1000 with diagnostic cardiac cath and 1-2% or less with angioplasty/stenting.  Marvin Cash, MD, FACC 07/04/2024 1:49 PM

## 2024-07-04 NOTE — Progress Notes (Signed)
 Progress Note   Patient: Marvin Carr FMW:[id number] DOB: Jul 30, 1956 DOA: 07/02/2024     1 DOS: the patient was seen and examined on 07/04/2024   Brief hospital course: Patient is a 68 year old male with PMHx of nonobstructive CAD, HTN, T2DM, cholangiocarcinoma with liver lesions, peritoneal carcinomatosis, malignant ascites, depression, anxiety, insomnia, OSA on CPAP, who presented with acute onset sharp chest pain associated with nausea and dizziness while watching TV.  Reportedly took aspirin  81 mg x 6, and nitroglycerin  x 2 before calling EMS.  In the ED, the patient was afebrile, SpO2 in the low 90s on RA, normal HR, stable BP.  Labs notable for D-dimer 3.95, troponin 26, normal CBC, normal LFTs.  CTA negative for PE but notable for new small left pleural effusion with mild pleural thickening and consolidation, liver lesions consistent with known malignancy, and peritoneal soft tissue nodularity.  EKG demonstrated sinus rhythm with inferiolateral ST segment changes.  EDP spoke with Dr. Marty, cardiology, recommended medical admission patient was started on IV heparin  drip.  Assessment and Plan:  #Chest pain #Nonobstructive CAD - Reportedly onset of central chest pain at rest with nausea and dizziness, later developed jaw pain - High-sensitivity troponins flat - Was evaluated by cardiology at [facility], thought to be not ACS but not candidate for NM stress test due to liver lesions/potential artifacts. Was transferred to [facility] for cardiac CTA.  - TTE (10/23) showed LVEF 60-65%, G1 DD, no RWMA.  Normal RV function.  No significant valvular abnormalities.  Normal IVC with low RA pressure. - Cardiology following - recommending CCTA  - Heparin  drip discontinued 10/24 - Continue aspirin  81 mg daily, Lipitor 10 mg daily - As needed nitroglycerin  - Was started on ivabradine 15 mg - Pending Cardiac CTA  #HTN - SBP 90-100s  - Home irbesartan  300 mg daily held   #GERD - Continue Protonix  40 mg twice  daily  #BPH - Continue Flomax  0.4 mg daily  #Mood disorder - Continue Wellbutrin  and Prozac   #OSA - Continue nightly CPAP  #History of cholangiocarcinoma with metastasis - Continue outpatient treatment oncology service  #RLS - Continue Mirapex   #Class II obesity (BMI Body mass index is 37.02 kg/m. kg/m) - Obesity is clinically significant and contributes to HTN, HLD, CAD, OSA, and GERD - Discussed lifestyle modification including dietary changes, regular physical activity, and weight reduction strategies.    Subjective: Patient seen at bedside today. Had chest pain overnight that required nitroglycerin  x 2 and morphine , but then had some drop in BP although remained asymptomatic but received 500 mL bolus. This morning, reports chest pain again radiating to his back, somewhat reproducible with palpation but no radiation to his left arm or jaw. Rates it 10/10, states responds to morphine . No associated SOB, N/V. Waiting on his Cardiac CTA. Held up due to HR  Physical Exam: Vitals:   07/04/24 0353 07/04/24 0400 07/04/24 0500 07/04/24 0600  BP: 94/63 94/65 100/69 97/68  Pulse:      Resp: 19 17 15 20   Temp:  97.7 F (36.5 C)    TempSrc:  Oral    SpO2: 98% 98% 97% 97%  Weight:      Height:       Physical Exam Constitutional:      General: He is not in acute distress.    Appearance: He is not ill-appearing.  HENT:     Mouth/Throat:     Mouth: Mucous membranes are moist.  Eyes:     Pupils: Pupils are equal, round,  and reactive to light.  Cardiovascular:     Rate and Rhythm: Normal rate and regular rhythm.     Heart sounds: Normal heart sounds. No murmur heard. Pulmonary:     Effort: Pulmonary effort is normal. No respiratory distress.     Breath sounds: Normal breath sounds. No wheezing.  Chest:     Comments: Right Port-A-Cath in place  Abdominal:     General: Bowel sounds are normal. There is no distension.     Palpations: Abdomen is soft.     Tenderness: There is  no abdominal tenderness. There is no guarding.  Musculoskeletal:     Right lower leg: Edema present.     Left lower leg: Edema present.     Comments: Chronic venous insufficiency changes  Skin:    General: Skin is warm and dry.     Capillary Refill: Capillary refill takes less than 2 seconds.  Neurological:     Mental Status: He is alert and oriented to person, place, and time. Mental status is at baseline.       Family Communication: None at bedside  Disposition: Status is: Inpatient Remains inpatient appropriate because: pending further cardiac workup  Planned Discharge Destination: Home   DVT prophylaxis: Heparin  drip  Time spent: 47 minutes  Author: Duffy Larch, MD 07/04/2024 8:35 AM  For on call review [email or web address].

## 2024-07-04 NOTE — Interval H&P Note (Signed)
 History and Physical Interval Note:  07/04/2024 2:02 PM  Marvin Carr  has presented today for surgery, with the diagnosis of unstable angina.  The various methods of treatment have been discussed with the patient and family. After consideration of risks, benefits and other options for treatment, the patient has consented to  Procedure(s): LEFT HEART CATH AND CORONARY ANGIOGRAPHY (N/A) as a surgical intervention.  The patient's history has been reviewed, patient examined, no change in status, stable for surgery.  I have reviewed the patient's chart and labs.  Questions were answered to the patient's satisfaction.     Aldred Mase K Meeghan Skipper

## 2024-07-05 ENCOUNTER — Encounter (HOSPITAL_COMMUNITY): Payer: Self-pay | Admitting: Internal Medicine

## 2024-07-05 DIAGNOSIS — I25110 Atherosclerotic heart disease of native coronary artery with unstable angina pectoris: Secondary | ICD-10-CM | POA: Diagnosis not present

## 2024-07-05 DIAGNOSIS — I200 Unstable angina: Secondary | ICD-10-CM | POA: Diagnosis not present

## 2024-07-05 LAB — CBC
HCT: 32.5 % — ABNORMAL LOW (ref 39.0–52.0)
Hemoglobin: 10.3 g/dL — ABNORMAL LOW (ref 13.0–17.0)
MCH: 28.9 pg (ref 26.0–34.0)
MCHC: 31.7 g/dL (ref 30.0–36.0)
MCV: 91.3 fL (ref 80.0–100.0)
Platelets: 250 K/uL (ref 150–400)
RBC: 3.56 MIL/uL — ABNORMAL LOW (ref 4.22–5.81)
RDW: 16.7 % — ABNORMAL HIGH (ref 11.5–15.5)
WBC: 4.8 K/uL (ref 4.0–10.5)
nRBC: 0 % (ref 0.0–0.2)

## 2024-07-05 LAB — BASIC METABOLIC PANEL WITH GFR
Anion gap: 10 (ref 5–15)
BUN: 20 mg/dL (ref 8–23)
CO2: 24 mmol/L (ref 22–32)
Calcium: 9 mg/dL (ref 8.9–10.3)
Chloride: 99 mmol/L (ref 98–111)
Creatinine, Ser: 1.06 mg/dL (ref 0.61–1.24)
GFR, Estimated: >60 mL/min (ref >60)
Glucose, Bld: 135 mg/dL — ABNORMAL HIGH (ref 70–99)
Potassium: 3.8 mmol/L (ref 3.5–5.1)
Sodium: 133 mmol/L — ABNORMAL LOW (ref 135–145)

## 2024-07-05 LAB — HELIX PHARMACOGENOMICS (PGX) CLOPIDOGREL TEST

## 2024-07-05 MED ORDER — DOCUSATE SODIUM 100 MG PO CAPS
100.0000 mg | ORAL_CAPSULE | Freq: Every day | ORAL | Status: DC | PRN
  Administered 2024-07-05 – 2024-07-06 (×3): 100 mg via ORAL
  Filled 2024-07-05 (×3): qty 1

## 2024-07-05 MED ORDER — ENOXAPARIN SODIUM 40 MG/0.4ML IJ SOSY
40.0000 mg | PREFILLED_SYRINGE | INTRAMUSCULAR | Status: DC
  Administered 2024-07-05 – 2024-07-07 (×3): 40 mg via SUBCUTANEOUS
  Filled 2024-07-05 (×3): qty 0.4

## 2024-07-05 NOTE — Progress Notes (Signed)
 Patient developed a swelling in his right upper arm, soft to touch and tender. On-call hospitalist paged and was told to reach out to cardiologist. On-call cardiologist on the floor to assess another patient. This Rn made the cardiologist aware.MD at bedside to assess the swelling. MD to put an order for ultrasound. Will continue to monitor the swelling.

## 2024-07-05 NOTE — Progress Notes (Signed)
 Progress Note   Patient: Marvin Carr FMW:[id number] DOB: 1956-03-13 DOA: 07/02/2024     2 DOS: the patient was seen and examined on 07/05/2024   Brief hospital course: Patient is a 68 year old male with PMHx of nonobstructive CAD, HTN, T2DM, cholangiocarcinoma with liver lesions, peritoneal carcinomatosis, malignant ascites, depression, anxiety, insomnia, OSA on CPAP, who presented with acute onset sharp chest pain associated with nausea and dizziness while watching TV.  Reportedly took aspirin  81 mg x 6, and nitroglycerin  x 2 before calling EMS.  In the ED, the patient was afebrile, SpO2 in the low 90s on RA, normal HR, stable BP.  Labs notable for D-dimer 3.95, troponin 26, normal CBC, normal LFTs.  CTA negative for PE but notable for new small left pleural effusion with mild pleural thickening and consolidation, liver lesions consistent with known malignancy, and peritoneal soft tissue nodularity.  EKG demonstrated sinus rhythm with inferiolateral ST segment changes.  EDP spoke with Dr. Marty, cardiology, recommended medical admission patient was started on IV heparin  drip. Underwent LHC on 10/24 with PCI distal Lcx DES x 1.  Assessment and Plan:  # CAD s/p PCI distal LCx DES x 1  # Unstable angina - Reportedly onset of central chest pain at rest with nausea and dizziness, later developed jaw pain - High-sensitivity troponins flat - TTE (10/23) showed LVEF 60-65%, G1 DD, no RWMA.  Normal RV function.  No significant valvular abnormalities.  Normal IVC with low RA pressure. - LCH (07/04/2024) showed mid Cx to dist Cx lesion 80% stenosed and 2nd diagonal lesion 95% stenosed, with DES placed in distal LCx - DAPT with aspirin  and Brilinta for minimum 6 months (preferably 1 year), followed by Brilinta monotherapy indefinitely per cardiology - Continue Lipitor 10 mg daily  # RUE pain - Noted RUE pain, tender to touch along anterior biceps with mild swelling and warmth, no erythema. Compartment  is soft. Right radial site without pain or swelling. - Right AC PIV removed - RUE ultrasound ordered to rule out DVT or hematoma  #HLD - Lipid panel (07/04/2024) shows cholesterol 179, triglycerides 115, HDL 34, LDL 22 - Continue Lipitor 10 mg daily  #HTN - SBP 90-100s  - Home irbesartan  300 mg daily held   #GERD - Continue Protonix  40 mg twice daily  #BPH - Continue Flomax  0.4 mg daily  #Mood disorder - Continue Wellbutrin  and Prozac   #OSA - Continue nightly CPAP  #History of cholangiocarcinoma with metastasis - Continue outpatient treatment oncology service  #RLS - Continue Mirapex   #Class II obesity (BMI Body mass index is 36.42 kg/m. kg/m) - Obesity is clinically significant and contributes to HTN, HLD, CAD, OSA, and GERD - Discussed lifestyle modification including dietary changes, regular physical activity, and weight reduction strategies.    Subjective: Patient seen at bedside today. Reports significant improvement in symptoms almost immediately post cath. Has a bit of nausea. Reports pain in his RUE around the mid bicep, states it feels warm. Denies any chest pain, SOB, abdominal pain, fevers, chills.  Physical Exam: Vitals:   07/04/24 2237 07/04/24 2310 07/05/24 0416 07/05/24 0745  BP:  108/69 100/67 130/85  Pulse: 78 74 77 77  Resp:  19 15 18   Temp:  98 F (36.7 C) 98.1 F (36.7 C) 98.4 F (36.9 C)  TempSrc:  Oral Oral Oral  SpO2: 92% 93% 94% 94%  Weight:      Height:       Physical Exam Constitutional:      General: He  is not in acute distress.    Appearance: He is not ill-appearing.  HENT:     Mouth/Throat:     Mouth: Mucous membranes are moist.  Eyes:     Pupils: Pupils are equal, round, and reactive to light.  Cardiovascular:     Rate and Rhythm: Normal rate and regular rhythm.     Heart sounds: Normal heart sounds. No murmur heard. Pulmonary:     Effort: Pulmonary effort is normal. No respiratory distress.     Breath sounds: Normal  breath sounds. No wheezing.  Chest:     Comments: Right Port-A-Cath in place  Abdominal:     General: Bowel sounds are normal. There is no distension.     Palpations: Abdomen is soft.     Tenderness: There is no abdominal tenderness. There is no guarding.  Musculoskeletal:     Right upper arm: Swelling and tenderness (TTP along anterior biceps with some swelling and slight warmth. Compartment is soft.) present.     Right wrist: No tenderness (Right radial site without pain or swelling).     Right lower leg: Edema present.     Left lower leg: Edema present.     Comments: Chronic venous insufficiency changes  Skin:    General: Skin is warm and dry.     Capillary Refill: Capillary refill takes less than 2 seconds.  Neurological:     Mental Status: He is alert and oriented to person, place, and time. Mental status is at baseline.    Family Communication: None at bedside  Disposition: Status is: Inpatient Remains inpatient appropriate because: unstable angina status post LHC with DES on 10/24, pending RUE ultrasound   Planned Discharge Destination: Home    DVT Prophylaxis: enoxaparin  (LOVENOX ) injection 40 mg Start: 07/05/24 0930  Time spent: 45 minutes  Author: Duffy Larch, MD 07/05/2024 8:12 AM  For on call review [email or web address].

## 2024-07-05 NOTE — Progress Notes (Signed)
 Pt c/o some nausea and also upper right arm swelling. Sts he woke up groggy/confused and wants to know what happened.   Has been up in room, encouraged ambulation in hall when he feels a little better.   Discussed with pt stent, restrictions, Brilinta importance, diet, exercise, NTG, and CRPII. Pt receptive. Will refer to [facility] CRPII. Wants help with his exercise. [phone] Aliene Aris BS, ACSM-CEP 07/05/2024 9:03 AM

## 2024-07-05 NOTE — Evaluation (Addendum)
 Occupational Therapy Evaluation Patient Details Name: Marvin Carr MRN: [id number] DOB: Jan 03, 1956 Today's Date: 07/05/2024   History of Present Illness   Patient is a 68 year old male who presented with acute onset sharp chest pain associated with nausea and dizziness.  Underwent cardiac cath on 10/24.  PMHx of nonobstructive CAD, HTN, T2DM, cholangiocarcinoma with liver lesions, peritoneal carcinomatosis, malignant ascites, depression, anxiety, insomnia, OSA on CPAP,     Clinical Impressions Pt currently modified independent for selfcare tasks simulated and toilet/shower transfers without use of an assistive device.  HR in the low to mid 80s with activity with BP in sitting at 130/84.  Oxygen sats at 99% on room air.  Pt lives alone and has friends come in occasionally but doesn't usually need any assist.  Still with some tightness in the right biceps secondary to mild swelling.  Instructed on gently AROM throughout the day to help with moving the fluid.   Feel pt is close to baseline at this time and warrants no further OT.  Educated on radial artery precautions to avoid pushing, pulling, or straining with the RUE and to not lift more than 5 lbs for the next 5 days.  Pt voices understanding.      Functional Status Assessment   Patient has not had a recent decline in their functional status     Equipment Recommendations   Other (comment)      Precautions/Restrictions   Precautions Precautions: Other (comment) Precaution/Restrictions Comments: radial artery precautions.  No lifting more than 5 lbs, no pushing, pulling. straining Restrictions Weight Bearing Restrictions Per Provider Order: No     Mobility Bed Mobility Overal bed mobility: Modified Independent                  Transfers Overall transfer level: Modified independent Equipment used: None                      Balance Overall balance assessment: No apparent balance deficits (not formally  assessed)                                         ADL either performed or assessed with clinical judgement   ADL Overall ADL's : Modified independent                                       General ADL Comments: Pt modified independent for simulated selfcare tasks.  He reports using a reacher and sockaide at home as needed for dressing tasks.  He states his shower is too small for a seat and prefers to standup.  Able to ambulate around the halls and get up and down from the lowered toilet without use of hands and without difficulty.  HR in the low to mid 80s and BP in sitting at 130/84.  No further OT needs at this time.     Vision Baseline Vision/History: 0 No visual deficits Ability to See in Adequate Light: 0 Adequate Patient Visual Report: No change from baseline Vision Assessment?: Wears glasses for reading     Perception Perception: Within Functional Limits       Praxis Praxis: WFL       Pertinent Vitals/Pain Pain Assessment Pain Assessment: No/denies pain     Extremity/Trunk Assessment Upper Extremity Assessment Upper Extremity Assessment: RUE  deficits/detail RUE Deficits / Details: Pt with recent radial cath but ROM at hand, digits, and shoulder WFLs.  Increased swelling in biceps area reported with some increased pain with end range flexion.  Pt following precautions for RUE at this time and only using minimally. RUE Sensation: WNL RUE Coordination: WNL   Lower Extremity Assessment Lower Extremity Assessment: Defer to PT evaluation   Cervical / Trunk Assessment Cervical / Trunk Assessment: Normal   Communication Communication Communication: No apparent difficulties   Cognition Arousal: Alert Behavior During Therapy: WFL for tasks assessed/performed Cognition: No apparent impairments                               Following commands: Intact                  Home Living Family/patient expects to be  discharged to:: Private residence Living Arrangements: Alone Available Help at Discharge: Other (Comment) (friends visit) Type of Home: House Home Access: Stairs to enter Secretary/administrator of Steps: 3 Entrance Stairs-Rails: Left;Right Home Layout: One level     Bathroom Shower/Tub: Producer, Television/film/video: Standard     Home Equipment: Cane - single point;Rollator (4 wheels)          Prior Functioning/Environment Prior Level of Function : Independent/Modified Independent             Mobility Comments: Used cane initially secondary to weakness             AM-PAC OT 6 Clicks Daily Activity     Outcome Measure Help from another person eating meals?: None Help from another person taking care of personal grooming?: None Help from another person toileting, which includes using toliet, bedpan, or urinal?: None Help from another person bathing (including washing, rinsing, drying)?: None Help from another person to put on and taking off regular upper body clothing?: None Help from another person to put on and taking off regular lower body clothing?: None 6 Click Score: 24   End of Session Nurse Communication: Mobility status  Activity Tolerance: Patient tolerated treatment well Patient left: in bed;with call bell/phone within reach                   Time: 1131-1155 OT Time Calculation (min): 24 min Charges:  OT General Charges $OT Visit: 1 Visit OT Evaluation $OT Eval Low Complexity: 1 Low  Lynwood Constant, OTR/L Acute Rehabilitation Services  Office [phone] 07/05/2024

## 2024-07-05 NOTE — Evaluation (Signed)
 Physical Therapy Brief Evaluation and Discharge Note Patient Details Name: Marvin Carr MRN: [id number] DOB: 1956-03-31 Today's Date: 07/05/2024   History of Present Illness  Patient is a 68 year old male who presented with acute onset sharp chest pain associated with nausea and dizziness.  Underwent cardiac cath on 10/24.  PMHx of nonobstructive CAD, HTN, T2DM, cholangiocarcinoma with liver lesions, peritoneal carcinomatosis, malignant ascites, depression, anxiety, insomnia, OSA on CPAP,  Clinical Impression  Pt admitted with above diagnosis. Pt able to ambulate independently. Reports that he has some weakness in his LEs. Discussed LE HEP with pt and pt appreciative. Offered Outpt PT or Cardiac Rehab and pt declines both. Pt states he needs to concentrate on treatment for his cancer. At this time no further  acute skilled PT needed. Will sign off.    PT Assessment Patient does not need any further PT services  Assistance Needed at Discharge  None    Equipment Recommendations None recommended by PT  Recommendations for Other Services       Precautions/Restrictions Precautions Precautions: Other (comment) Precaution/Restrictions Comments: radial artery precautions.  No lifting more than 5 lbs, no pushing, pulling. straining Restrictions Weight Bearing Restrictions Per Provider Order: No        Mobility  Bed Mobility Rolling: Independent        Transfers Overall transfer level: Independent Equipment used: None                    Ambulation/Gait Ambulation/Gait assistance: Independent Gait Distance (Feet): 50 Feet Assistive device: None   Gait Speed: Pace WFL General Gait Details: Ambulating in room ad lib. no LOB. Pt states he uses cane for incr support and feels some deconditioning at times.  Home Activity Instructions    Stairs            Modified [name] (Stroke Patients Only)        Balance Overall balance assessment: Needs  assistance Sitting-balance support: No upper extremity supported, Feet supported Sitting balance-Leahy Scale: Normal     Standing balance support: No upper extremity supported, During functional activity Standing balance-Leahy Scale: Good Standing balance comment: can march in place and was standing talking to PT most of session.          Pertinent Vitals/Pain PT - Brief Vital Signs All Vital Signs Stable: Yes Pain Assessment Pain Assessment: No/denies pain     Home Living Family/patient expects to be discharged to:: Private residence Living Arrangements: Alone   Home Environment: Stairs to enter  [employer] of Steps: 2 Home Equipment: Cane - single point;Rollator (4 wheels)        Prior Function Level of Independence: Independent      UE/LE Assessment   UE ROM/Strength/Tone/Coordination: Generalized weakness (limitations right UE after radial cath)    LE ROM/Strength/Tone/Coordination: [facility]      Communication   Communication Communication: No apparent difficulties     Cognition Overall Cognitive Status: Appears within functional limits for tasks assessed/performed       General Comments      Exercises Other Exercises Other Exercises: Discussed pt performing bil LE exercises, marching, abduction, squats, hip extension. Discussed pt could use counter for stability vs standing up against wall for squats. Pt appreciative.   Assessment/Plan    PT Problem List         PT Visit Diagnosis Muscle weakness (generalized) (M62.81)    No Skilled PT All education completed;Patient at baseline level of functioning;Patient is independent with all acitivity/mobility   Co-evaluation  AMPAC 6 Clicks Help needed turning from your back to your side while in a flat bed without using bedrails?: None Help needed moving from lying on your back to sitting on the side of a flat bed without using bedrails?: None Help needed moving to and from a bed  to a chair (including a wheelchair)?: None Help needed standing up from a chair using your arms (e.g., wheelchair or bedside chair)?: None Help needed to walk in hospital room?: None Help needed climbing 3-5 steps with a railing? : None 6 Click Score: 24      End of Session   Activity Tolerance: Patient tolerated treatment well Patient left: in bed;with call bell/phone within reach (sitting EOB) Nurse Communication: Mobility status PT Visit Diagnosis: Muscle weakness (generalized) (M62.81)     Time: 8654-8643 PT Time Calculation (min) (ACUTE ONLY): 11 min  Charges:   PT Evaluation $PT Eval Low Complexity: 1 Low      [place] Silveria M,PT Acute Rehab Services [phone]   Stephane JULIANNA Bevel  07/05/2024, 3:22 PM

## 2024-07-05 NOTE — Progress Notes (Signed)
 Progress Note  Patient Name: Marvin Carr Date of Encounter: 07/05/2024 [facility] Cardiologist: Jayson Sierras, MD   Interval Summary   LHC yesterday with PCI to LCX. Some discomfort in right bicep overnight, near antecubital IV. No pain at right radial artery site. Otherwise no acute overnight events or complaints this morning.  Vital Signs Vitals:   07/04/24 2237 07/04/24 2310 07/05/24 0416 07/05/24 0745  BP:  108/69 100/67 130/85  Pulse: 78 74 77 77  Resp:  19 15 18   Temp:  98 F (36.7 C) 98.1 F (36.7 C) 98.4 F (36.9 C)  TempSrc:  Oral Oral Oral  SpO2: 92% 93% 94% 94%  Weight:      Height:        Intake/Output Summary (Last 24 hours) at 07/05/2024 0750 Last data filed at 07/04/2024 2300 Gross per 24 hour  Intake 860 ml  Output 400 ml  Net 460 ml      07/04/2024    6:20 PM 07/02/2024   11:38 PM 07/01/2024    8:01 AM  Last 3 Weights  Weight (lbs) 268 lb 8.3 oz 272 lb 14.9 oz 272 lb 14.9 oz  Weight (kg) 121.8 kg 123.8 kg 123.8 kg      Telemetry/ECG  SR, no VAs - Personally Reviewed  Physical Exam  General: Well developed, in no acute distress.  Neck: No JVD.  Cardiac: Normal rate, regular rhythm.  Resp: Normal work of breathing.  Ext: No edema.  Neuro: No gross focal deficits.  Psych: Normal affect.   Echo 07/03/24:   1. Left ventricular ejection fraction, by estimation, is 60 to 65%. The  left ventricle has normal function. The left ventricle has no regional  wall motion abnormalities. Left ventricular diastolic parameters are  consistent with Grade I diastolic  dysfunction (impaired relaxation).   2. Right ventricular systolic function is normal. The right ventricular  size is normal.   3. The mitral valve is normal in structure. No evidence of mitral valve  regurgitation. No evidence of mitral stenosis.   4. The aortic valve was not well visualized. Aortic valve regurgitation  is not visualized. No aortic stenosis is present.    5. Aortic dilatation noted. There is mild dilatation of the aortic root,  measuring 41 mm.   6. The inferior vena cava is normal in size with greater than 50%  respiratory variability, suggesting right atrial pressure of 3 mmHg.   LHC 07/04/24:    Mid Cx to Dist Cx lesion is 80% stenosed.   2nd Diag lesion is 95% stenosed.   A stent was successfully placed.   Post intervention, there is a 0% residual stenosis.   1.  High-grade distal left circumflex lesion treated with one 3.5 x 16 mm Synergy XD stent postdilated to 3.75 mm. 2.  LVEDP of 20 mmHg.   Recommendation: Dual antiplatelet therapy for at least 6 months but preferably 1 year with aspirin  and Brilinta followed by Brilinta monotherapy indefinitely.  Assessment & Plan  Mr. Strout is a a 68 y.o. 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 Ricky.  CAD s/p PCI to LCX Unstable Angina  -Dual antiplatelet therapy for at least 6 months but preferably 1 year with aspirin  and Brilinta followed by Brilinta monotherapy indefinitely. -Atorvastatin  10mg  daily.   RUE Pain: Right radial artery site without hematoma, pain, swelling, bleeding. Pain is in the right bicep, near his antecubital IV, query infiltration.  - Remove right antecubital IV. May need ultrasound if pain doesn't resolve, defer to primary team.   HTN -Normotensive.    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 -Follows with oncology on gemcitabine , cisplatin  and durvalumab  and  durvalumab  maintenance.  Signed, Fonda Kitty, MD

## 2024-07-06 ENCOUNTER — Inpatient Hospital Stay (HOSPITAL_COMMUNITY)

## 2024-07-06 DIAGNOSIS — I25110 Atherosclerotic heart disease of native coronary artery with unstable angina pectoris: Secondary | ICD-10-CM | POA: Diagnosis not present

## 2024-07-06 DIAGNOSIS — M7989 Other specified soft tissue disorders: Secondary | ICD-10-CM | POA: Diagnosis not present

## 2024-07-06 DIAGNOSIS — I200 Unstable angina: Secondary | ICD-10-CM | POA: Diagnosis not present

## 2024-07-06 LAB — CBC
HCT: 31.8 % — ABNORMAL LOW (ref 39.0–52.0)
Hemoglobin: 10.3 g/dL — ABNORMAL LOW (ref 13.0–17.0)
MCH: 29.2 pg (ref 26.0–34.0)
MCHC: 32.4 g/dL (ref 30.0–36.0)
MCV: 90.1 fL (ref 80.0–100.0)
Platelets: 219 K/uL (ref 150–400)
RBC: 3.53 MIL/uL — ABNORMAL LOW (ref 4.22–5.81)
RDW: 16.3 % — ABNORMAL HIGH (ref 11.5–15.5)
WBC: 4.4 K/uL (ref 4.0–10.5)
nRBC: 0 % (ref 0.0–0.2)

## 2024-07-06 LAB — BASIC METABOLIC PANEL WITH GFR
Anion gap: 11 (ref 5–15)
BUN: 18 mg/dL (ref 8–23)
CO2: 23 mmol/L (ref 22–32)
Calcium: 8.6 mg/dL — ABNORMAL LOW (ref 8.9–10.3)
Chloride: 100 mmol/L (ref 98–111)
Creatinine, Ser: 0.97 mg/dL (ref 0.61–1.24)
GFR, Estimated: >60 mL/min (ref >60)
Glucose, Bld: 111 mg/dL — ABNORMAL HIGH (ref 70–99)
Potassium: 3.6 mmol/L (ref 3.5–5.1)
Sodium: 134 mmol/L — ABNORMAL LOW (ref 135–145)

## 2024-07-06 MED ORDER — ATORVASTATIN CALCIUM 40 MG PO TABS
40.0000 mg | ORAL_TABLET | Freq: Every day | ORAL | Status: DC
Start: 2024-07-06 — End: 2024-07-07
  Administered 2024-07-06 – 2024-07-07 (×2): 40 mg via ORAL
  Filled 2024-07-06 (×2): qty 1

## 2024-07-06 MED ORDER — MAGIC MOUTHWASH W/LIDOCAINE
5.0000 mL | Freq: Four times a day (QID) | ORAL | Status: DC
  Administered 2024-07-06 – 2024-07-07 (×4): 5 mL via ORAL
  Filled 2024-07-06 (×8): qty 5

## 2024-07-06 NOTE — Progress Notes (Addendum)
 Progress Note   Patient: Marvin Carr FMW:[id number] DOB: 1955/12/25 DOA: 07/02/2024     3 DOS: the patient was seen and examined on 07/06/2024   Brief hospital course: Patient is a 68 year old male with PMHx of nonobstructive CAD, HTN, T2DM, cholangiocarcinoma with liver lesions, peritoneal carcinomatosis, malignant ascites, depression, anxiety, insomnia, OSA on CPAP, who presented with acute onset sharp chest pain associated with nausea and dizziness while watching TV.  Reportedly took aspirin  81 mg x 6, and nitroglycerin  x 2 before calling EMS.  In the ED, the patient was afebrile, SpO2 in the low 90s on RA, normal HR, stable BP.  Labs notable for D-dimer 3.95, troponin 26, normal CBC, normal LFTs.  CTA negative for PE but notable for new small left pleural effusion with mild pleural thickening and consolidation, liver lesions consistent with known malignancy, and peritoneal soft tissue nodularity.  EKG demonstrated sinus rhythm with inferiolateral ST segment changes.  EDP spoke with Dr. Marty, cardiology, recommended medical admission patient was started on IV heparin  drip. Underwent LHC on 10/24 with PCI distal Lcx DES x 1.  Assessment and Plan:  # CAD s/p PCI distal LCx DES x 1  # Unstable angina - Reportedly onset of central chest pain at rest with nausea and dizziness, later developed jaw pain - High-sensitivity troponins flat - TTE (10/23) showed LVEF 60-65%, G1 DD, no RWMA.  Normal RV function.  No significant valvular abnormalities.  Normal IVC with low RA pressure. - LCH (07/04/2024) showed mid Cx to dist Cx lesion 80% stenosed and 2nd diagonal lesion 95% stenosed, with DES placed in distal LCx - DAPT with aspirin  and Brilinta for minimum 6 months (preferably 1 year), followed by Brilinta monotherapy indefinitely per cardiology - Continue Lipitor 10 mg daily  # RUE pain - Noted RUE pain, tender to touch along anterior biceps with mild swelling and warmth, no erythema, with  interval development of bruising at and lateral to the [facility] down the forearm. Compartment is soft. Strong right radial pulse noted, without pain or swelling. Grip strength and sensation intact - Right AC PIV removed - RUE ultrasound pending  #HLD - Lipid panel (07/04/2024) shows cholesterol 179, triglycerides 115, HDL 34, LDL 22 - Continue Lipitor 10 mg daily  #HTN - SBP 90-100s  - Home irbesartan  300 mg daily held   #GERD - Continue Protonix  40 mg twice daily  #BPH - Continue Flomax  0.4 mg daily  #Mood disorder - Continue Wellbutrin  and Prozac   #OSA - Continue nightly CPAP  #History of cholangiocarcinoma with metastasis - Continue outpatient treatment oncology service  #RLS - Continue Mirapex   #Class II obesity (BMI Body mass index is 36.42 kg/m. kg/m) - Obesity is clinically significant and contributes to HTN, HLD, CAD, OSA, and GERD - Discussed lifestyle modification including dietary changes, regular physical activity, and weight reduction strategies.    Subjective: Patient seen at bedside today. Reports significant improvement in symptoms almost immediately post cath. Has a bit of nausea. Reports pain in his RUE around the mid bicep, states it feels warm. Denies any chest pain, SOB, abdominal pain, fevers, chills.  Physical Exam: Vitals:   07/05/24 1931 07/05/24 2231 07/06/24 0343 07/06/24 0735  BP: 137/84 124/69 130/75 120/71  Pulse: 88 82 77 87  Resp: 19 20 20 20   Temp: 98.1 F (36.7 C) 98.4 F (36.9 C) 97.6 F (36.4 C) 98 F (36.7 C)  TempSrc: Oral Oral Oral Oral  SpO2: 96% 93% 93% 94%  Weight:  Height:       Physical Exam Constitutional:      General: He is not in acute distress.    Appearance: He is not ill-appearing.  HENT:     Mouth/Throat:     Mouth: Mucous membranes are moist.  Eyes:     Pupils: Pupils are equal, round, and reactive to light.  Cardiovascular:     Rate and Rhythm: Normal rate and regular rhythm.     Heart sounds: Normal  heart sounds. No murmur heard. Pulmonary:     Effort: Pulmonary effort is normal. No respiratory distress.     Breath sounds: Normal breath sounds. No wheezing.  Chest:     Comments: Right Port-A-Cath in place  Abdominal:     General: Bowel sounds are normal. There is no distension.     Palpations: Abdomen is soft.     Tenderness: There is no abdominal tenderness. There is no guarding.  Musculoskeletal:     Right upper arm: Swelling and tenderness (TTP along anterior biceps with some swelling and slight warmth. Compartment is soft. Bruising noted to the medial forearm and antecubital fossa) present.     Right wrist: No tenderness (Right radial site without pain or swelling).     Right lower leg: Edema present.     Left lower leg: Edema present.     Comments: Chronic venous insufficiency changes  Skin:    General: Skin is warm and dry.     Capillary Refill: Capillary refill takes less than 2 seconds.  Neurological:     Mental Status: He is alert and oriented to person, place, and time. Mental status is at baseline.    Family Communication: None at bedside  Disposition: Status is: Inpatient Remains inpatient appropriate because: unstable angina status post LHC with DES on 10/24, pending RUE ultrasound   Planned Discharge Destination: Home    DVT Prophylaxis: enoxaparin  (LOVENOX ) injection 40 mg Start: 07/05/24 1000enoxaparin (LOVENOX ) injection 40 mg Start: 07/05/24 1000  Time spent: 45 minutes  Author: Keiston Manley Al-Sultani, MD 07/06/2024 11:09 AM  For on call review [email or web address].

## 2024-07-06 NOTE — Progress Notes (Signed)
 Patient refused CPAP for the night

## 2024-07-06 NOTE — Progress Notes (Signed)
 Progress Note  Patient Name: Heinz Eckert Date of Encounter: 07/06/2024 [facility] Cardiologist: Jayson Sierras, MD   Interval Summary   Still some discomfort in right bicep. Otherwise doing well. No new or acute complaints.   Vital Signs Vitals:   07/05/24 1931 07/05/24 2231 07/06/24 0343 07/06/24 0735  BP: 137/84 124/69 130/75 120/71  Pulse: 88 82 77 87  Resp: 19 20 20 20   Temp: 98.1 F (36.7 C) 98.4 F (36.9 C) 97.6 F (36.4 C) 98 F (36.7 C)  TempSrc: Oral Oral Oral Oral  SpO2: 96% 93% 93% 94%  Weight:      Height:        Intake/Output Summary (Last 24 hours) at 07/06/2024 0740 Last data filed at 07/06/2024 0344 Gross per 24 hour  Intake 360 ml  Output 0 ml  Net 360 ml      07/04/2024    6:20 PM 07/02/2024   11:38 PM 07/01/2024    8:01 AM  Last 3 Weights  Weight (lbs) 268 lb 8.3 oz 272 lb 14.9 oz 272 lb 14.9 oz  Weight (kg) 121.8 kg 123.8 kg 123.8 kg      Telemetry/ECG  SR, no VAs - Personally Reviewed  Physical Exam  General: Well developed, in no acute distress.  Neck: No JVD.  Cardiac: Normal rate, regular rhythm.  Resp: Normal work of breathing.  Ext: No edema. RUE bicep tender to palpation. Neuro: No gross focal deficits.  Psych: Normal affect.   Echo 07/03/24:   1. Left ventricular ejection fraction, by estimation, is 60 to 65%. The  left ventricle has normal function. The left ventricle has no regional  wall motion abnormalities. Left ventricular diastolic parameters are  consistent with Grade I diastolic  dysfunction (impaired relaxation).   2. Right ventricular systolic function is normal. The right ventricular  size is normal.   3. The mitral valve is normal in structure. No evidence of mitral valve  regurgitation. No evidence of mitral stenosis.   4. The aortic valve was not well visualized. Aortic valve regurgitation  is not visualized. No aortic stenosis is present.   5. Aortic dilatation noted. There is mild  dilatation of the aortic root,  measuring 41 mm.   6. The inferior vena cava is normal in size with greater than 50%  respiratory variability, suggesting right atrial pressure of 3 mmHg.   LHC 07/04/24:    Mid Cx to Dist Cx lesion is 80% stenosed.   2nd Diag lesion is 95% stenosed.   A stent was successfully placed.   Post intervention, there is a 0% residual stenosis.   1.  High-grade distal left circumflex lesion treated with one 3.5 x 16 mm Synergy XD stent postdilated to 3.75 mm. 2.  LVEDP of 20 mmHg.   Recommendation: Dual antiplatelet therapy for at least 6 months but preferably 1 year with aspirin  and Brilinta followed by Brilinta monotherapy indefinitely.  Assessment & Plan  Mr. Lipschutz is a a 68 y.o. 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 Ricky.    CAD s/p PCI to LCX Unstable Angina  -Dual  antiplatelet therapy for at least 6 months but preferably 1 year with aspirin  and Brilinta followed by Brilinta monotherapy indefinitely. -Increase atorvastatin  to 40mg  daily.   RUE Pain: Right radial artery site without hematoma, pain, swelling, bleeding. Pain is in the right bicep, near his antecubital IV, query infiltration.  -Right antecubital IV has been removed. RUE ultrasound is pending.  HTN -Normotensive.    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 -Follows with oncology on gemcitabine , cisplatin  and durvalumab  and durvalumab  maintenance.  Will need 7-14 day follow up in  clinic. Cardiology will sign off.   Signed, Fonda Kitty, MD

## 2024-07-06 NOTE — Plan of Care (Signed)
   Problem: Activity: Goal: Ability to tolerate increased activity will improve Outcome: Progressing

## 2024-07-06 NOTE — Progress Notes (Signed)
 Right upper extremity venous duplex has been completed. Preliminary results can be found in CV Proc through chart review.   07/06/24 2:34 PM Cathlyn Collet RVT

## 2024-07-06 NOTE — Plan of Care (Signed)

## 2024-07-07 ENCOUNTER — Telehealth (HOSPITAL_COMMUNITY): Payer: Self-pay | Admitting: Pharmacy Technician

## 2024-07-07 ENCOUNTER — Encounter: Payer: Self-pay | Admitting: Oncology

## 2024-07-07 ENCOUNTER — Other Ambulatory Visit (HOSPITAL_COMMUNITY): Payer: Self-pay

## 2024-07-07 DIAGNOSIS — I200 Unstable angina: Secondary | ICD-10-CM | POA: Diagnosis not present

## 2024-07-07 LAB — CBC
HCT: 31.2 % — ABNORMAL LOW (ref 39.0–52.0)
Hemoglobin: 10.1 g/dL — ABNORMAL LOW (ref 13.0–17.0)
MCH: 29.5 pg (ref 26.0–34.0)
MCHC: 32.4 g/dL (ref 30.0–36.0)
MCV: 91.2 fL (ref 80.0–100.0)
Platelets: 196 K/uL (ref 150–400)
RBC: 3.42 MIL/uL — ABNORMAL LOW (ref 4.22–5.81)
RDW: 16.2 % — ABNORMAL HIGH (ref 11.5–15.5)
WBC: 4.5 K/uL (ref 4.0–10.5)
nRBC: 0 % (ref 0.0–0.2)

## 2024-07-07 LAB — BASIC METABOLIC PANEL WITH GFR
Anion gap: 11 (ref 5–15)
BUN: 15 mg/dL (ref 8–23)
CO2: 22 mmol/L (ref 22–32)
Calcium: 8.4 mg/dL — ABNORMAL LOW (ref 8.9–10.3)
Chloride: 99 mmol/L (ref 98–111)
Creatinine, Ser: 0.96 mg/dL (ref 0.61–1.24)
GFR, Estimated: >60 mL/min (ref >60)
Glucose, Bld: 107 mg/dL — ABNORMAL HIGH (ref 70–99)
Potassium: 3.6 mmol/L (ref 3.5–5.1)
Sodium: 132 mmol/L — ABNORMAL LOW (ref 135–145)

## 2024-07-07 LAB — POCT ACTIVATED CLOTTING TIME: Activated Clotting Time: 314 s

## 2024-07-07 LAB — LIPOPROTEIN A (LPA): Lipoprotein (a): 44.8 nmol/L — ABNORMAL HIGH (ref <75.0)

## 2024-07-07 MED ORDER — ATORVASTATIN CALCIUM 40 MG PO TABS
40.0000 mg | ORAL_TABLET | Freq: Every day | ORAL | 0 refills | Status: DC
  Filled 2024-07-07: qty 30, 30d supply, fill #0

## 2024-07-07 MED ORDER — ASPIRIN 81 MG PO TBEC
81.0000 mg | DELAYED_RELEASE_TABLET | Freq: Every day | ORAL | 0 refills | Status: DC
  Filled 2024-07-07: qty 30, 30d supply, fill #0

## 2024-07-07 MED ORDER — TICAGRELOR 90 MG PO TABS
90.0000 mg | ORAL_TABLET | Freq: Two times a day (BID) | ORAL | 0 refills | Status: DC
  Filled 2024-07-07: qty 60, 30d supply, fill #0

## 2024-07-07 MED ORDER — HEPARIN SOD (PORK) LOCK FLUSH 100 UNIT/ML IV SOLN
500.0000 [IU] | INTRAVENOUS | Status: AC | PRN
  Administered 2024-07-07: 500 [IU]

## 2024-07-07 NOTE — Progress Notes (Signed)
 D/C education complete. AVS given. PIV removed prior to discharge.

## 2024-07-07 NOTE — Plan of Care (Signed)

## 2024-07-07 NOTE — Progress Notes (Signed)
 Mobility Specialist Progress Note;   07/07/24 0929  Mobility  Activity Ambulated independently;Pivoted/transferred from bed to chair  Level of Assistance Modified independent, requires aide device or extra time  Assistive Device None  Distance Ambulated (ft) 400 ft  Activity Response Tolerated well  Mobility Referral Yes  Mobility visit 1 Mobility  Mobility Specialist Start Time (ACUTE ONLY) [id number]  Mobility Specialist Stop Time (ACUTE ONLY) 0935  Mobility Specialist Time Calculation (min) (ACUTE ONLY) 6 min   Pt eager for mobility. Required no physical assistance during ambulation. HR up to 108 bpm w/ exertion. C/o SOB at end of session however VSS on RA. Requested to sit in chair at [facility]. Pt left with all needs met.   Lauraine Erm Mobility Specialist Please contact via SecureChat or [employer] [phone]

## 2024-07-07 NOTE — Telephone Encounter (Signed)
 Patient Product/process Development Scientist completed.    The patient is insured through [employer]. Patient has Medicare and is not eligible for a copay card, but may be able to apply for patient assistance or Medicare RX Payment Plan (Patient Must reach out to their plan, if eligible for payment plan), if available.    Ran test claim for ticagrelor 90 mg and the current 30 day co-pay is $119.45 due to a deductible.   This test claim was processed through [facility]- copay amounts may vary at other pharmacies due to pharmacy/plan contracts, or as the patient moves through the different stages of their insurance plan.     Reyes Sharps, CPHT Pharmacy Technician Patient Advocate Specialist [facility] Patient Advocate Team Direct Number: [phone]  Fax: [phone]

## 2024-07-07 NOTE — Discharge Summary (Signed)
 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  Reportedly took aspirin  81 mg x 6, and nitroglycerin  x 2 before calling EMS.  In the ED, the patient was afebrile, SpO2 in the low 90s on RA, normal HR, stable BP.  Labs notable for D-dimer 3.95, troponin 26, normal CBC, normal LFTs. Troponins were flat.  CTA negative for PE but notable for new small left pleural effusion with mild pleural thickening and consolidation, liver lesions consistent with known malignancy, and peritoneal soft tissue nodularity.  EKG demonstrated sinus rhythm with inferiolateral ST segment changes.  EDP spoke with Dr. Marty, cardiology, recommended medical admission patient was started on IV heparin  drip. TTE (10/23) showed LVEF 60-65%, G1 DD, no RWMA.  Normal RV function.  No significant valvular abnormalities.  Normal IVC with low RA pressure. Underwent LHC (07/04/2024)which showed mid Cx to dist Cx lesion 80% stenosed and 2nd diagonal lesion 95% stenosed, with DES placed in distal Lcx.  Was started on DAPT with aspirin  and Brilinta for a minimum of 6 months (preferably 1 year), followed by Brilinta monotherapy indefinitely, per cardiology. He is also to increase the dose of Lipitor to 40 mg. After his LHC, he  Noted RUE pain, tender to touch along anterior biceps with mild swelling and warmth, no erythema, with interval development of bruising at and lateral to the [facility] down the forearm. Compartment is soft. Strong right radial pulse noted, without pain or swelling. Grip strength and sensation intact  RUE ultrasound was negative for DVT. The pain improved and the patient was deemed stable for discharge.      Consultants: cardiology Procedures performed: LHC  Disposition: Home Diet recommendation:  Discharge Diet Orders (From admission, onward)     Start     Ordered   07/07/24 0000  Diet - low sodium heart healthy        07/07/24 1422            DISCHARGE MEDICATION: Allergies as of 07/07/2024   No Known Allergies      Medication List     PAUSE taking these medications    amLODipine -olmesartan  10-40 MG tablet Wait to take this until your doctor or other care provider tells you to start again. Please check your blood pressure twice a day-once in the morning and once at bedtime-record your readings in a log, and bring it to your next appointment with your primary care provider to help guide any needed changes to your treatment.  Commonly known as: AZOR Take 1 tablet by mouth daily.   olmesartan  40 MG tablet Wait to take this  until your doctor or other care provider tells you to start again. Please check your blood pressure twice a day-once in the morning and once at bedtime-record your readings in a log, and bring it to your next appointment with your primary care provider to help guide any needed changes to your treatment.  Commonly known as: BENICAR  TAKE ONE TABLET BY MOUTH EVERY DAY       STOP taking these medications    FLUoxetine  10 MG  capsule Commonly known as: PROZAC    lidocaine -prilocaine  cream Commonly known as: EMLA        TAKE these medications    acetaminophen  500 MG tablet Commonly known as: TYLENOL  Take 2 tablets (1,000 mg total) by mouth every 6 (six) hours. What changed:  when to take this reasons to take this   aspirin  EC 81 MG tablet Take 1 tablet (81 mg total) by mouth daily. Swallow whole.   atorvastatin  40 MG tablet Commonly known as: LIPITOR Take 1 tablet (40 mg total) by mouth daily. What changed:  medication strength how much to take   benzonatate  200 MG capsule Commonly known as: TESSALON  Take 1 capsule (200 mg total) by mouth 3 (three) times daily as needed for cough.   bumetanide  2 MG tablet Commonly known as: BUMEX  Take 1 tablet (2 mg total) by mouth daily as needed.   buPROPion  300 MG 24 hr tablet Commonly known as: WELLBUTRIN  XL Take 300 mg by mouth daily.   cetirizine  10 MG tablet Commonly known as: ZYRTEC  Take 10 mg by mouth daily.   cyclobenzaprine  10 MG tablet Commonly known as: FLEXERIL  Take 10 mg by mouth every 6 (six) hours as needed for muscle spasms.   dexamethasone  4 MG tablet Commonly known as: DECADRON  Take 2 tablets (8 mg total) by mouth daily. Start the day after chemotherapy for 2 days. Take with food.   docusate sodium 100 MG capsule Commonly known as: COLACE Take 200 mg by mouth at bedtime.   Eszopiclone 3 MG Tabs Take 3 mg by mouth at bedtime.   famotidine  20 MG tablet Commonly known as: PEPCID  Take 20 mg by mouth at bedtime.   Farxiga 10 MG Tabs tablet Generic drug: dapagliflozin propanediol Take 10 mg by mouth daily.   fenofibrate  160 MG tablet Take 160 mg by mouth daily.   guaiFENesin 600 MG 12 hr tablet Commonly known as: MUCINEX Take 600 mg by mouth 2 (two) times daily as needed for cough or to loosen phlegm.   IMFINZI  IV Inject into the vein every 21 ( twenty-one) days.   magic mouthwash (lidocaine , diphenhydrAMINE , alum &  mag hydroxide) suspension Swish and swallow 5 mLs 3 (three) times daily as needed for mouth pain.   magnesium  oxide 400 (240 Mg) MG tablet Commonly known as: MAG-OX Take 1 tablet (400 mg total) by mouth 2 (two) times daily. What changed: when to take this   metFORMIN  500 MG 24 hr tablet Commonly known as: GLUCOPHAGE -XR Take 500 mg by mouth daily with breakfast.   nitroGLYCERIN  0.4 MG SL tablet Commonly known as: NITROSTAT  Place 0.4 mg under the tongue every 5 (five) minutes x 3 doses as needed for chest pain (if no relief after 2nd dose, proceed to ED or call 911).   omeprazole 40 MG capsule Commonly known as: PRILOSEC Take 40 mg by mouth daily as needed (heartburn, acid reflux).   ondansetron  8 MG tablet Commonly known as: Zofran  Take 1 tablet (8 mg total) by mouth every 8 (eight) hours  as needed for nausea or vomiting. Start on the third day after chemotherapy.   pramipexole  0.75 MG tablet Commonly known as: MIRAPEX  Take 1 tablet (0.75 mg total) by mouth 3 (three) times daily. What changed:  when to take this additional instructions   prochlorperazine  10 MG tablet Commonly known as: COMPAZINE  Take 1 tablet (10 mg total) by mouth every 6 (six) hours as needed for nausea or vomiting.   tamsulosin  0.4 MG Caps capsule Commonly known as: FLOMAX  Take 0.4 mg by mouth daily.   ticagrelor 90 MG Tabs tablet Commonly known as: BRILINTA Take 1 tablet (90 mg total) by mouth 2 (two) times daily.   traMADol  50 MG tablet Commonly known as: ULTRAM  Take 50 mg by mouth at bedtime as needed for severe pain (pain score 7-10).   traZODone  100 MG tablet Commonly known as: DESYREL  Take 1 tablet (100 mg total) by mouth at bedtime. What changed: how much to take        Follow-up Information     [name], MD Follow up in 1 week(s).   Specialty: Internal Medicine Contact information: [address] [place] [facility] [address] [phone]                Discharge  Exam: Filed Weights   07/02/24 2338 07/04/24 1820  Weight: 123.8 kg 121.8 kg    Constitutional:      General: He is not in acute distress.    Appearance: He is not ill-appearing.  HENT:     Mouth/Throat:     Mouth: Mucous membranes are moist.  Eyes:     Pupils: Pupils are equal, round, and reactive to light.  Cardiovascular:     Rate and Rhythm: Normal rate and regular rhythm.     Heart sounds: Normal heart sounds. No murmur heard. Pulmonary:     Effort: Pulmonary effort is normal. No respiratory distress.     Breath sounds: Normal breath sounds. No wheezing.  Chest:     Comments: Right Port-A-Cath in place  Abdominal:     General: Bowel sounds are normal. There is no distension.     Palpations: Abdomen is soft.     Tenderness: There is no abdominal tenderness. There is no guarding.  Musculoskeletal:     Right upper arm: Swelling and tenderness (TTP along anterior biceps with some swelling and slight warmth. Compartment is soft. Bruising noted to the medial forearm and antecubital fossa) present.     Right wrist: No tenderness (Right radial site without pain or swelling).     Right lower leg: Edema present.     Left lower leg: Edema present.     Comments: Chronic venous insufficiency changes  Skin:    General: Skin is warm and dry.     Capillary Refill: Capillary refill takes less than 2 seconds.  Neurological:     Mental Status: He is alert and oriented to person, place, and time. Mental status is at baseline.       Condition at discharge: good  The results of significant diagnostics from this hospitalization (including imaging, microbiology, ancillary and laboratory) are listed below for reference.   Imaging Studies: VAS US  UPPER EXTREMITY VENOUS DUPLEX Result Date: 07/07/2024 UPPER VENOUS STUDY  Patient Name:  WIN GUAJARDO  Date of Exam:   07/06/2024 Medical Rec #: [id number]        Accession #:    [id number] Date of Birth: 10-16-1955        Patient Gender: M  Patient Age:   81 years Exam Location:  [facility] Procedure:      VAS US  UPPER EXTREMITY VENOUS DUPLEX Referring Phys: DUFFY AL-SULTANI --------------------------------------------------------------------------------  Indications: Pain Risk Factors: Trauma. Limitations: Poor ultrasound/tissue interface. Comparison Study: No prior studies. Performing Technologist: Cordella Collet RVT  Examination Guidelines: A complete evaluation includes B-mode imaging, spectral Doppler, color Doppler, and power Doppler as needed of all accessible portions of each vessel. Bilateral testing is considered an integral part of a complete examination. Limited examinations for reoccurring indications may be performed as noted.  Right Findings: +----------+------------+---------+-----------+----------+-------+ RIGHT     CompressiblePhasicitySpontaneousPropertiesSummary +----------+------------+---------+-----------+----------+-------+ IJV           Full       Yes       Yes                      +----------+------------+---------+-----------+----------+-------+ Subclavian               Yes       Yes                      +----------+------------+---------+-----------+----------+-------+ Axillary      Full       Yes       Yes                      +----------+------------+---------+-----------+----------+-------+ Brachial      Full                                          +----------+------------+---------+-----------+----------+-------+ Radial        Full                                          +----------+------------+---------+-----------+----------+-------+ Ulnar         Full                                          +----------+------------+---------+-----------+----------+-------+ Cephalic      Full                                          +----------+------------+---------+-----------+----------+-------+ Basilic       Full                                           +----------+------------+---------+-----------+----------+-------+  Left Findings: +----------+------------+---------+-----------+----------+-------+ LEFT      CompressiblePhasicitySpontaneousPropertiesSummary +----------+------------+---------+-----------+----------+-------+ Subclavian               Yes       Yes                      +----------+------------+---------+-----------+----------+-------+  Summary:  Right: No evidence of deep vein thrombosis in the upper extremity. No evidence of superficial vein thrombosis in the upper extremity.  Left: No evidence of thrombosis in the subclavian.  *See table(s) above for measurements and observations.  Diagnosing physician: Gaile New MD Electronically signed  by Gaile New MD on 07/07/2024 at 10:25:45 AM.    Final    CARDIAC CATHETERIZATION Result Date: 07/04/2024   Mid Cx to Dist Cx lesion is 80% stenosed.   2nd Diag lesion is 95% stenosed.   A stent was successfully placed.   Post intervention, there is a 0% residual stenosis. 1.  High-grade distal left circumflex lesion treated with one 3.5 x 16 mm Synergy XD stent postdilated to 3.75 mm. 2.  LVEDP of 20 mmHg. Recommendation: Dual antiplatelet therapy for at least 6 months but preferably 1 year with aspirin  and Brilinta followed by Brilinta monotherapy indefinitely.   ECHOCARDIOGRAM COMPLETE Result Date: 07/03/2024    ECHOCARDIOGRAM REPORT   Patient Name:   VIKASH NEST Date of Exam: 07/03/2024 Medical Rec #:  [id number]       Height:       72.0 in Accession #:    [id number]      Weight:       272.9 lb Date of Birth:  05/28/1956       BSA:          2.432 m Patient Age:    68 years        BP:           104/70 mmHg Patient Gender: M               HR:           82 bpm. Exam Location:  [facility] Procedure: 2D Echo, Cardiac Doppler and Color Doppler (Both Spectral and Color            Flow Doppler were utilized during procedure). Indications:    Chest Pain R07.9  History:        Patient has  prior history of Echocardiogram examinations, most                 recent 04/25/2023. CAD; Risk Factors:Hypertension and Sleep                 Apnea. H/O Hyperlipidemia, Dyspnea on exertion.  Sonographer:    BERNARDA ROCKS Referring Phys: [id number] SCOTESIA Y DUNLAP IMPRESSIONS  1. Left ventricular ejection fraction, by estimation, is 60 to 65%. The left ventricle has normal function. The left ventricle has no regional wall motion abnormalities. Left ventricular diastolic parameters are consistent with Grade I diastolic dysfunction (impaired relaxation).  2. Right ventricular systolic function is normal. The right ventricular size is normal.  3. The mitral valve is normal in structure. No evidence of mitral valve regurgitation. No evidence of mitral stenosis.  4. The aortic valve was not well visualized. Aortic valve regurgitation is not visualized. No aortic stenosis is present.  5. Aortic dilatation noted. There is mild dilatation of the aortic root, measuring 41 mm.  6. The inferior vena cava is normal in size with greater than 50% respiratory variability, suggesting right atrial pressure of 3 mmHg. Comparison(s): No significant change from prior study. FINDINGS  Left Ventricle: Left ventricular ejection fraction, by estimation, is 60 to 65%. The left ventricle has normal function. The left ventricle has no regional wall motion abnormalities. Strain was performed and the global longitudinal strain is indeterminate. The left ventricular internal cavity size was normal in size. There is no left ventricular hypertrophy. Left ventricular diastolic parameters are consistent with Grade I diastolic dysfunction (impaired relaxation). Normal left ventricular filling pressure. Right Ventricle: The right ventricular size is normal. No increase in right ventricular wall thickness. Right ventricular systolic function  is normal. Left Atrium: Left atrial size was normal in size. Right Atrium: Right atrial size was normal in size.  Pericardium: There is no evidence of pericardial effusion. Mitral Valve: The mitral valve is normal in structure. No evidence of mitral valve regurgitation. No evidence of mitral valve stenosis. MV peak gradient, 2.6 mmHg. The mean mitral valve gradient is 2.0 mmHg. Tricuspid Valve: The tricuspid valve is normal in structure. Tricuspid valve regurgitation is not demonstrated. No evidence of tricuspid stenosis. Aortic Valve: The aortic valve was not well visualized. Aortic valve regurgitation is not visualized. No aortic stenosis is present. Aortic valve mean gradient measures 3.0 mmHg. Aortic valve peak gradient measures 7.3 mmHg. Aortic valve area, by VTI measures 3.41 cm. Pulmonic Valve: The pulmonic valve was not well visualized. Pulmonic valve regurgitation is trivial. No evidence of pulmonic stenosis. Aorta: Aortic dilatation noted. There is mild dilatation of the aortic root, measuring 41 mm. Venous: The inferior vena cava is normal in size with greater than 50% respiratory variability, suggesting right atrial pressure of 3 mmHg. IAS/Shunts: No atrial level shunt detected by color flow Doppler. Additional Comments: 3D was performed not requiring image post processing on an independent workstation and was indeterminate.  LEFT VENTRICLE PLAX 2D LVIDd:         4.80 cm      Diastology LVIDs:         3.20 cm      LV e' medial:    7.18 cm/s LV PW:         0.90 cm      LV E/e' medial:  8.3 LV IVS:        1.00 cm      LV e' lateral:   7.07 cm/s LVOT diam:     2.20 cm      LV E/e' lateral: 8.4 LV SV:         69 LV SV Index:   28 LVOT Area:     3.80 cm  LV Volumes (MOD) LV vol d, MOD A2C: 72.4 ml LV vol d, MOD A4C: 102.0 ml LV vol s, MOD A2C: 24.2 ml LV vol s, MOD A4C: 40.1 ml LV SV MOD A2C:     48.2 ml LV SV MOD A4C:     102.0 ml LV SV MOD BP:      55.3 ml RIGHT VENTRICLE             IVC RV Basal diam:  3.50 cm     IVC diam: 2.00 cm RV S prime:     13.50 cm/s TAPSE (M-mode): 2.2 cm LEFT ATRIUM             Index         RIGHT ATRIUM           Index LA diam:        3.40 cm 1.40 cm/m   RA Area:     13.20 cm LA Vol (A2C):   21.1 ml 8.68 ml/m   RA Volume:   29.40 ml  12.09 ml/m LA Vol (A4C):   28.8 ml 11.84 ml/m LA Biplane Vol: 25.5 ml 10.49 ml/m  AORTIC VALVE                    PULMONIC VALVE AV Area (Vmax):    2.80 cm     PV Vmax:       1.09 m/s AV Area (Vmean):   2.87 cm     PV Peak  grad:  4.8 mmHg AV Area (VTI):     3.41 cm AV Vmax:           135.00 cm/s AV Vmean:          82.600 cm/s AV VTI:            0.202 m AV Peak Grad:      7.3 mmHg AV Mean Grad:      3.0 mmHg LVOT Vmax:         99.60 cm/s LVOT Vmean:        62.300 cm/s LVOT VTI:          0.181 m LVOT/AV VTI ratio: 0.90  AORTA Ao Root diam: 4.10 cm Ao Asc diam:  3.60 cm MITRAL VALVE MV Area (PHT): 3.81 cm    SHUNTS MV Area VTI:   2.78 cm    Systemic VTI:  0.18 m MV Peak grad:  2.6 mmHg    Systemic Diam: 2.20 cm MV Mean grad:  2.0 mmHg MV Vmax:       0.80 m/s MV Vmean:      60.0 cm/s MV Decel Time: 199 msec MV E velocity: 59.70 cm/s MV A velocity: 54.00 cm/s MV E/A ratio:  1.11 Vishnu Priya Mallipeddi Electronically signed by Diannah Late Mallipeddi Signature Date/Time: 07/03/2024/3:22:31 PM    Final    CT Angio Chest PE W and/or Wo Contrast Result Date: 07/03/2024 EXAM: CTA of the Chest with contrast for PE 07/03/2024 04:45:48 AM TECHNIQUE: CTA of the chest was performed without and with the administration of intravenous contrast. 75 mL of iohexol  (OMNIPAQUE ) 350 MG/ML injection. Multiplanar reformatted images are provided for review. MIP images are provided for review. Automated exposure control, iterative reconstruction, and/or weight based adjustment of the mA/kV was utilized to reduce the radiation dose to as low as reasonably achievable. COMPARISON: 05/15/2024 CLINICAL HISTORY: Pulmonary embolism (PE) suspected, high prob. Cc center cp 3 hours Nausea no vomiting. Stabbing pain increased from 5 to 10/10 170/110 HR 78 FINDINGS: PULMONARY ARTERIES: Pulmonary  arteries are adequately opacified for evaluation. No pulmonary embolism. Main pulmonary artery is normal in caliber. MEDIASTINUM: The heart and pericardium demonstrate coronary artery calcifications. Aortic atherosclerosis is present. LYMPH NODES: No mediastinal, hilar or axillary lymphadenopathy. LUNGS AND PLEURA: Atelectasis and subpleural consolidation change. New small left pleural effusion with overlying mild pleural thickening overlying the posterior right base. No pneumothorax. UPPER ABDOMEN: Corresponding to known malignancy suboptimally visualized reflecting phase of contrast enhancement. Index lesion within the anterior left lobe of liver measures approximately 1.8 x 1.5 cm image 114/4. Previously 1.5 x 1.0 cm. Peritoneal soft tissue nodularity is identified compared peritoneal thickening within the left upper quadrant of the abdomen with adjacent nodularity compatible with known peritoneal carcinomatosis. SOFT TISSUES AND BONES: No acute or suspicious osseous findings. No acute soft tissue abnormality. IMPRESSION: 1. No evidence of pulmonary embolism. 2. New small left pleural effusion with overlying mild pleural thickening, consolidation and atelectasis. 3. Multifocal hypodense liver lesions compatible with known malignancy. Suboptimally visualized on the current exam due to phase of contrast opacification. 4. Peritoneal soft tissue nodularity compatible with peritoneal carcinomatosis. 5. Aortic atherosclerosis and coronary artery calcifications. Electronically signed by: Waddell Calk MD 07/03/2024 05:14 AM EDT RP Workstation: [id number]   DG Chest Portable 1 View Result Date: 07/03/2024 EXAM: 1 VIEW(S) XRAY OF THE CHEST 07/03/2024 12:27:00 AM COMPARISON: 06/01/2023 CLINICAL HISTORY: eval for pneumonia/chest pain. eval for pneumonia/chest pain - slight dizziness upon standing, mid AP region chest pressure, slight  cough FINDINGS: LINES, TUBES AND DEVICES: Right chest port with tip in SVC. LUNGS AND  PLEURA: Left basilar opacities. New small left pleural effusion. No pulmonary edema. No pneumothorax. HEART AND MEDIASTINUM: No acute abnormality of the cardiac and mediastinal silhouettes. BONES AND SOFT TISSUES: No acute osseous abnormality. IMPRESSION: 1. New small left pleural effusion with left basilar atelectasis or pneumonia. Electronically signed by: Norman Gatlin MD 07/03/2024 12:33 AM EDT RP Workstation: [id number]   US  Paracentesis Result Date: 06/13/2024 INDICATION: 68 year old male with history of cholangiocarcinoma with malignant ascites. Request made for therapeutic paracentesis. EXAM: ULTRASOUND GUIDED THERAPEUTIC PARACENTESIS MEDICATIONS: 10 mL 1% lidocaine  COMPLICATIONS: None immediate. PROCEDURE: Informed written consent was obtained from the patient after a discussion of the risks, benefits and alternatives to treatment. A timeout was performed prior to the initiation of the procedure. Initial ultrasound scanning demonstrates a moderate amount of ascites within the left lateral abdominal quadrant. The left lateral abdomen was prepped and draped in the usual sterile fashion. 1% lidocaine  was used for local anesthesia. Following this, a 19 gauge, 7-cm, Yueh catheter was introduced. An ultrasound image was saved for documentation purposes. The paracentesis was performed. The catheter was removed and a dressing was applied. The patient tolerated the procedure well without immediate post procedural complication. FINDINGS: A total of approximately 2.3 liters of clear, yellow fluid was removed. Samples were sent to the laboratory as requested by the clinical team. IMPRESSION: Successful ultrasound-guided paracentesis yielding 2.3 liters of peritoneal fluid. Performed by: Kacie Matthews PA-C Electronically Signed   By: Wilkie Lent M.D.   On: 06/13/2024 14:30    Microbiology: Results for orders placed or performed during the hospital encounter of 07/02/24  MRSA Next Gen by PCR, Nasal      Status: None   Collection Time: 07/04/24  7:57 PM   Specimen: Nasal Mucosa; Nasal Swab  Result Value Ref Range Status   MRSA by PCR Next Gen NOT DETECTED NOT DETECTED Final    Comment: (NOTE) The GeneXpert MRSA Assay (FDA approved for NASAL specimens only), is one component of a comprehensive MRSA colonization surveillance program. It is not intended to diagnose MRSA infection nor to guide or monitor treatment for MRSA infections. Test performance is not FDA approved in patients less than 44 years old. Performed at [facility], [address], [place], [place] [address]     Labs: CBC: Recent Labs  Lab 07/01/24 0805 07/03/24 0023 07/04/24 0309 07/05/24 0520 07/06/24 0420 07/07/24 0520  WBC 6.5 5.7 4.3 4.8 4.4 4.5  NEUTROABS 4.4 4.2  --   --   --   --   HGB 11.5* 13.1 11.1* 10.3* 10.3* 10.1*  HCT 36.3* 41.4 36.3* 32.5* 31.8* 31.2*  MCV 92.6 92.6 94.5 91.3 90.1 91.2  PLT 313 278 215 250 219 196   Basic Metabolic Panel: Recent Labs  Lab 07/01/24 0805 07/03/24 0023 07/04/24 0309 07/05/24 0520 07/06/24 0420 07/07/24 0520  NA 134* 138 133* 133* 134* 132*  K 3.9 4.4 4.1 3.8 3.6 3.6  CL 97* 99 100 99 100 99  CO2 26 27 20* 24 23 22   GLUCOSE 122* 131* 124* 135* 111* 107*  BUN 22 24* 20 20 18 15   CREATININE 1.02 1.07 0.94 1.06 0.97 0.96  CALCIUM  9.5 10.0 8.7* 9.0 8.6* 8.4*  MG 1.9  --   --   --   --   --    Liver Function Tests: Recent Labs  Lab 07/01/24 0805 07/03/24 0023  AST 20 30  ALT 10 16  ALKPHOS 106 112  BILITOT 0.4 0.3  PROT 6.8 7.5  ALBUMIN  3.6 4.0   CBG: No results for input(s): GLUCAP in the last 168 hours.  Discharge time spent: 37 minutes  Signed: Naida Escalante Al-Sultani, MD [facility] 07/07/2024

## 2024-07-08 ENCOUNTER — Telehealth: Payer: Self-pay

## 2024-07-08 NOTE — Transitions of Care (Post Inpatient/ED Visit) (Signed)
   07/08/2024  Name: Marvin Carr MRN: [id number] DOB: 08/05/1956  Today's TOC FU Call Status: Today's TOC FU Call Status:: Unsuccessful Call (2nd Attempt) Unsuccessful Call (2nd Attempt) Date: 07/08/24  Attempted to reach the patient regarding the most recent Inpatient/ED visit.  Follow Up Plan: Additional outreach attempts will be made to reach the patient to complete the Transitions of Care (Post Inpatient/ED visit) call.   Ryosuke Ericksen J. Waylynn Benefiel RN, MSN [facility], [facility] RN Care Manager Direct Dial: [phone]  Fax: [phone] Website: [email or web address]

## 2024-07-09 ENCOUNTER — Telehealth: Payer: Self-pay

## 2024-07-09 NOTE — Patient Instructions (Signed)
 Visit Information  Thank you for taking time to visit with me today.   When to call your doctor Contact your health care provider right away if you have: Chest pain or a return of the symptoms you had before the angioplasty that are promptly relieved with rest or medicines. Fever above  100.4 F ( 38C) (or 1 degree or higher above your normal temperature); or other signs of infection (redness, swelling, drainage, or warmth at the incision site of the leg or wrist). Bleeding, bruising, or a large swelling where the catheter (tube) was inserted. Call 911 if you have symptoms such as: Unusual chest pain or chest pain that doesn't go away. Symptoms that you had before the angioplasty return and aren't eased with rest or medicines. Constant or increasing pain or numbness in your leg, or your leg looks blue or feels cold. Unusual shortness of breath. Feeling faint. Trouble speaking or weakness in any muscle. Blood in your urine; bloody, black, or tarry stools; or any other kind of bleeding.   Patient verbalizes understanding of instructions and care plan provided today and agrees to view in MyChart. Active MyChart status and patient understanding of how to access instructions and care plan via MyChart confirmed with patient.     The patient has been provided with contact information for the care management team and has been advised to call with any health related questions or concerns.   Please call the care guide team at [phone] if you need to cancel or reschedule your appointment.   Please call the Suicide and Crisis Lifeline: 988 if you are experiencing a Mental Health or Behavioral Health Crisis or need someone to talk to.  Rhealynn Myhre J. Khristi Schiller RN, MSN [facility], [facility] Health RN Care Manager Direct Dial: [phone]  Fax: [phone] Website: [email or web address]

## 2024-07-09 NOTE — Transitions of Care (Post Inpatient/ED Visit) (Signed)
 07/09/2024  Name: Marvin Carr MRN: [id number] DOB: 10-06-1955  Today's TOC FU Call Status: Today's TOC FU Call Status:: Successful TOC FU Call Completed TOC FU Call Complete Date: 07/09/24 Patient's Name and Date of Birth confirmed.  Transition Care Management Follow-up Telephone Call Date of Discharge: 07/07/24 Discharge Facility: [facility]) Type of Discharge: Inpatient Admission Primary Inpatient Discharge Diagnosis:: Chest pain How have you been since you were released from the hospital?: Better Any questions or concerns?: No  Items Reviewed: Did you receive and understand the discharge instructions provided?: Yes Medications obtained,verified, and reconciled?: Yes (Medications Reviewed) Any new allergies since your discharge?: No Dietary orders reviewed?: Yes Type of Diet Ordered:: Low sodium heart healthy Do you have support at home?: No (Lives alone but states friends call and visit frequently)  Medications Reviewed Today: Medications Reviewed Today     Reviewed by Daeja Helderman, RN (Case Manager) on 07/09/24 at 1423  Med List Status: <None>   Medication Order Taking? Sig Documenting Provider Last Dose Status Informant  acetaminophen  (TYLENOL ) 500 MG tablet [id number] Yes Take 2 tablets (1,000 mg total) by mouth every 6 (six) hours. Patti Rosina SAUNDERS, PA-C  Active Self, Pharmacy Records  amLODipine -olmesartan  (AZOR) 10-40 MG tablet [id number]  Take 1 tablet by mouth daily. [provider]  Active            Med Note DARILYN, DAWN S   Thu Jul 03, 2024  9:38 AM) Pt has not started  aspirin  EC 81 MG tablet [id number] Yes Take 1 tablet (81 mg total) by mouth daily. Swallow whole. Al-Sultani, Anmar, MD  Active   atorvastatin  (LIPITOR) 40 MG tablet [id number] Yes Take 1 tablet (40 mg total) by mouth daily. Al-Sultani, Anmar, MD  Active   benzonatate  (TESSALON ) 200 MG capsule [id number] Yes Take 1 capsule (200 mg total) by mouth 3 (three) times daily as needed  for cough. Davonna Siad, MD  Active Self, Pharmacy Records  bumetanide  (BUMEX ) 2 MG tablet [id number] Yes Take 1 tablet (2 mg total) by mouth daily as needed. Johnson Laymon HERO, PA-C  Active Self, Pharmacy Records  buPROPion  (WELLBUTRIN  XL) 300 MG 24 hr tablet [id number] Yes Take 300 mg by mouth daily. [provider]  Active Self, Pharmacy Records  cetirizine  (ZYRTEC ) 10 MG tablet [id number] Yes Take 10 mg by mouth daily. [provider]  Active Self, Pharmacy Records  cyclobenzaprine  (FLEXERIL ) 10 MG tablet [id number] Yes Take 10 mg by mouth every 6 (six) hours as needed for muscle spasms. [provider]  Active Self, Pharmacy Records  dapagliflozin propanediol (FARXIGA) 10 MG TABS tablet [id number] Yes Take 10 mg by mouth daily. [provider]  Active Self, Pharmacy Records  dexamethasone  (DECADRON ) 4 MG tablet [id number] Yes Take 2 tablets (8 mg total) by mouth daily. Start the day after chemotherapy for 2 days. Take with food. Davonna Siad, MD  Active Self, Pharmacy Records           Med Note [place], [place] S   Thu Jul 03, 2024  9:41 AM) Pt started yet  docusate sodium (COLACE) 100 MG capsule [id number] Yes Take 200 mg by mouth at bedtime. [provider]  Active Self, Pharmacy Records  Durvalumab  (IMFINZI  IV) [id number] Yes Inject into the vein every 21 ( twenty-one) days. [provider]  Active Self, Pharmacy Records  Eszopiclone 3 MG TABS [id number] Yes Take 3 mg by mouth at bedtime. [provider]  Active Self, Pharmacy Records  famotidine  (PEPCID ) 20  MG tablet [id number] Yes Take 20 mg by mouth at bedtime. [provider]  Active Self, Pharmacy Records  fenofibrate  160 MG tablet [id number] Yes Take 160 mg by mouth daily. [provider]  Active Self, Pharmacy Records  guaiFENesin (MUCINEX) 600 MG 12 hr tablet [id number] Yes Take 600 mg by mouth 2 (two) times daily as needed for cough or to loosen phlegm.  [provider]  Active Self, Pharmacy Records  magic mouthwash (lidocaine , diphenhydrAMINE , alum & mag hydroxide) suspension [id number] Yes Swish and swallow 5 mLs 3 (three) times daily as needed for mouth pain. Geofm Delon BRAVO, NP  Active Self, Pharmacy Records  magnesium  oxide (MAG-OX) 400 (240 Mg) MG tablet [id number] Yes Take 1 tablet (400 mg total) by mouth 2 (two) times daily.  Patient taking differently: Take 200 mg by mouth daily.   Rogers Hai, MD  Active Self, Pharmacy Records  magnesium  sulfate 2 GM/50ML IVPB [id number]     Active   metFORMIN  (GLUCOPHAGE -XR) 500 MG 24 hr tablet [id number] Yes Take 500 mg by mouth daily with breakfast. [provider]  Active Self, Pharmacy Records           Med Note (WARD, ANGELICA G   Thu May 15, 2024  6:57 PM) Took 2 at lunchtime  nitroGLYCERIN  (NITROSTAT ) 0.4 MG SL tablet [id number] Yes Place 0.4 mg under the tongue every 5 (five) minutes x 3 doses as needed for chest pain (if no relief after 2nd dose, proceed to ED or call 911). [provider]  Active Self, Pharmacy Records           Med Note DARILYN, STEPHANE GORMAN Schaumann Jul 03, 2024 10:07 AM)    olmesartan  (BENICAR ) 40 MG tablet [id number]  TAKE ONE TABLET BY MOUTH EVERY DAY  Patient not taking: Reported on 07/03/2024   Katragadda, Sreedhar, MD  Active Self, Pharmacy Records  omeprazole (PRILOSEC) 40 MG capsule [id number] Yes Take 40 mg by mouth daily as needed (heartburn, acid reflux). [provider]  Active Self, Pharmacy Records  ondansetron  (ZOFRAN ) 8 MG tablet [id number] Yes Take 1 tablet (8 mg total) by mouth every 8 (eight) hours as needed for nausea or vomiting. Start on the third day after chemotherapy. Davonna Siad, MD  Active Self, Pharmacy Records  pramipexole  (MIRAPEX ) 0.75 MG tablet [id number] Yes Take 1 tablet (0.75 mg total) by mouth 3 (three) times daily. Rogers Hai, MD  Active Self, Pharmacy Records           Med Note [place],  [place]   Thu Jul 03, 2024 10:11 AM)    prochlorperazine  (COMPAZINE ) 10 MG tablet [id number] Yes Take 1 tablet (10 mg total) by mouth every 6 (six) hours as needed for nausea or vomiting. Kandala, Hyndavi, MD  Active Self, Pharmacy Records  tamsulosin  (FLOMAX ) 0.4 MG CAPS capsule [id number] Yes Take 0.4 mg by mouth daily. [provider]  Active Self, Pharmacy Records  ticagrelor (BRILINTA) 90 MG TABS tablet [id number] Yes Take 1 tablet (90 mg total) by mouth 2 (two) times daily. Al-Sultani, Anmar, MD  Active   traMADol  (ULTRAM ) 50 MG tablet [id number] Yes Take 50 mg by mouth at bedtime as needed for severe pain (pain score 7-10). [provider]  Active Self, Pharmacy Records  traZODone  (DESYREL ) 100 MG tablet [id number] Yes Take 1 tablet (100 mg total) by mouth at bedtime.  Patient taking differently: Take 50 mg by mouth at bedtime.   Davonna Siad, MD  Active Self, Pharmacy  Records            Home Care and Equipment/Supplies: Were Home Health Services Ordered?: NA Any new equipment or medical supplies ordered?: NA  Functional Questionnaire: Do you need assistance with bathing/showering or dressing?: No Do you need assistance with meal preparation?: No Do you need assistance with eating?: No Do you have difficulty maintaining continence: No Do you need assistance with getting out of bed/getting out of a chair/moving?: No Do you have difficulty managing or taking your medications?: No  Follow up appointments reviewed: PCP Follow-up appointment confirmed?: Yes Date of PCP follow-up appointment?: 07/11/24 Follow-up Provider: Dr. Shona Driscilla Salvage Follow-up appointment confirmed?: No Reason Specialist Follow-Up Not Confirmed: Patient has Specialist Provider Number and will Call for Appointment (Patient to call cardiology for closer follow up) Do you need transportation to your follow-up appointment?: No Do you understand care options if your condition(s) worsen?:  Yes-patient verbalized understanding    Djuna Frechette DOROTHA Seeds RN, MSN [facility]  [facility], [facility] Health RN Care Manager Direct Dial: [phone]  Fax: [phone] Website: [email or web address]

## 2024-07-11 ENCOUNTER — Ambulatory Visit: Attending: Student | Admitting: Student

## 2024-07-11 ENCOUNTER — Encounter: Payer: Self-pay | Admitting: Student

## 2024-07-11 VITALS — BP 104/58 | HR 86 | Ht 72.0 in | Wt 270.0 lb

## 2024-07-11 DIAGNOSIS — E785 Hyperlipidemia, unspecified: Secondary | ICD-10-CM | POA: Diagnosis present

## 2024-07-11 DIAGNOSIS — I77810 Thoracic aortic ectasia: Secondary | ICD-10-CM | POA: Diagnosis not present

## 2024-07-11 DIAGNOSIS — C221 Intrahepatic bile duct carcinoma: Secondary | ICD-10-CM | POA: Diagnosis present

## 2024-07-11 DIAGNOSIS — I2510 Atherosclerotic heart disease of native coronary artery without angina pectoris: Secondary | ICD-10-CM | POA: Diagnosis present

## 2024-07-11 DIAGNOSIS — C787 Secondary malignant neoplasm of liver and intrahepatic bile duct: Secondary | ICD-10-CM | POA: Insufficient documentation

## 2024-07-11 DIAGNOSIS — I10 Essential (primary) hypertension: Secondary | ICD-10-CM | POA: Insufficient documentation

## 2024-07-11 MED ORDER — ATORVASTATIN CALCIUM 20 MG PO TABS: 20.0000 mg | ORAL_TABLET | Freq: Every day | ORAL | 3 refills | Status: DC

## 2024-07-11 NOTE — Patient Instructions (Addendum)
 Medication Instructions:   DECREASE Atorvastatin  to 20 mg daily  Labwork: None today  Testing/Procedures: None today  Follow-Up: Keep Januaryappointment with Dr.McDowell  Any Other Special Instructions Will Be Listed Below (If Applicable).   Verify you are Not taking Olmesartan   If you need a refill on your cardiac medications before your next appointment, please call your pharmacy.

## 2024-07-11 NOTE — Progress Notes (Signed)
 Cardiology Office Note    Date:  07/12/2024  ID:  Marvin Carr, DOB 27-Dec-1955, MRN [id number] Cardiologist: Jayson Sierras, MD Cardiology APP:  Johnson Laymon HERO, PA-C { :  History of Present Illness:    Marvin Carr is a 68 y.o. male with past medical history of CAD (nonobstructive disease by prior cardiac catheterization in 2014, low-risk NST in 02/2017), HTN, HLD, Type II DM, OSA (on CPAP), orthostatic hypotension and cholangiocarcinoma with peritoneal carcinomatosis who presents to the office today for hospital follow-up.  He recently presented to [facility] ED on 07/03/2024 for evaluation of chest pain which had started the day prior to arrival. [facility] troponin values were overall flat between 26 and 59. Echocardiogram showed a preserved EF of 60 to 65% with grade 1 diastolic dysfunction, normal RV function and no significant valve abnormalities. Did have mild dilatation of the aortic root at 41 mm.  A nuclear stress test was not performed given liver lesions and potential for artifact, therefore he was transferred to [facility] with plans for a Coronary CTA but this was unable to be performed given inability to control his heart rate. Cardiac catheterization was therefore performed and showed 80% mid to distal LCx stenosis and 95% second diagonal stenosis. He underwent DES placement to the LCx and medical management was recommended of the diagonal stenosis given the small vessel size. He did have right upper extremity pain the following morning but this was mostly along his bicep and near an IV site, not near his radial artery. Upper extremity ultrasound was performed and showed no evidence of thrombosis. Was discharged home on 07/07/2024 and medications at that time included ASA 81 mg daily, Brilinta 90 mg twice daily, Atorvastatin  40 mg daily, Farxiga 10 mg daily and PRN SL NTG. It appears that Amlodipine -Olmesartan  was paused until the time of follow-up.  In talking with the patient  today, he reports doing well from a cardiac perspective since his recent hospitalization. He denies any recurrent episodes of chest pain. Does have baseline dyspnea on exertion but no acute changes in this. No specific orthopnea, PND or pitting edema. He does have a hematoma along his right arm which was present following IV insertion difficulties during his hospitalization. Says this is gradually improving. He does report worsening muscle aches along his legs since hospital discharge. He is unsure if he is taking Amlodipine -Olmesartan  at this time but BP is low-normal at 104/58 during today's visit.  Studies Reviewed:   EKG: EKG is not ordered today.  Echocardiogram: 06/2024 IMPRESSIONS     1. Left ventricular ejection fraction, by estimation, is 60 to 65%. The  left ventricle has normal function. The left ventricle has no regional  wall motion abnormalities. Left ventricular diastolic parameters are  consistent with Grade I diastolic  dysfunction (impaired relaxation).   2. Right ventricular systolic function is normal. The right ventricular  size is normal.   3. The mitral valve is normal in structure. No evidence of mitral valve  regurgitation. No evidence of mitral stenosis.   4. The aortic valve was not well visualized. Aortic valve regurgitation  is not visualized. No aortic stenosis is present.   5. Aortic dilatation noted. There is mild dilatation of the aortic root,  measuring 41 mm.   6. The inferior vena cava is normal in size with greater than 50%  respiratory variability, suggesting right atrial pressure of 3 mmHg.   Comparison(s): No significant change from prior study.    Cardiac Catheterization: 06/2024  Mid Cx to Dist Cx lesion is 80% stenosed.   2nd Diag lesion is 95% stenosed.   A stent was successfully placed.   Post intervention, there is a 0% residual stenosis.   1.  High-grade distal left circumflex lesion treated with one 3.5 x 16 mm Synergy XD stent  postdilated to 3.75 mm. 2.  LVEDP of 20 mmHg.   Recommendation: Dual antiplatelet therapy for at least 6 months but preferably 1 year with aspirin  and Brilinta followed by Brilinta monotherapy indefinitely.  Physical Exam:   VS:  BP (!) 104/58 (BP Location: Left Arm, Cuff Size: Large)   Pulse 86   Ht 6' (1.829 m)   Wt 270 lb (122.5 kg)   SpO2 95%   BMI 36.62 kg/m    Wt Readings from Last 3 Encounters:  07/11/24 270 lb (122.5 kg)  07/04/24 268 lb 8.3 oz (121.8 kg)  07/01/24 272 lb 14.9 oz (123.8 kg)     GEN: Well nourished, well developed male appearing in no acute distress NECK: No JVD; No carotid bruits CARDIAC: RRR, no murmurs, rubs, gallops RESPIRATORY:  Clear to auscultation without rales, wheezing or rhonchi  ABDOMEN: Appears non-distended. No obvious abdominal masses. EXTREMITIES: No clubbing or cyanosis. No pitting edema.  Distal pedal pulses are 2+ bilaterally.  Ecchymosis along upper extremity but no evidence of hematoma.   Assessment and Plan:   1. Coronary artery disease involving native coronary artery of native heart without angina pectoris - He recently underwent DES placement to the LCx earlier this month as discussed above with diagonal stenosis treated medically given this was a small vessel. Was recommended to be on DAPT for at least 6 months and preferably 1 year and then Brilinta as monotherapy indefinitely.  - He denies any recurrent anginal symptoms. Continue current medical therapy with ASA 81 mg daily, Brilinta 90 mg twice daily, Farxiga 10mg  daily and Atorvastatin  (with dose adjustment as outlined below).  2. Essential hypertension - BP is at soft at 104/58 during today's visit. He is unsure if he is taking Olmesartan  40 mg daily. If not currently taking, I recommended that he continue to hold this. If he is taking Olmesartan , I recommended that he reduce the dose to 20 mg daily.  3. Hyperlipidemia LDL goal <55 - FLP during recent admission showed total  cholesterol 79, triglycerides 115, HDL 34 and LDL 22. LP(a) low at 44. He is currently taking Atorvastatin  40 mg daily and was on 10 mg daily prior to admission. Given his well-controlled numbers and reports of myalgias, will reduce dosing to 20 mg daily. Recheck FLP and LFT's in 2-3 months.   4.  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 Previously underwent first-line treatment with Gemcitabine , Cisplatin  and Durvalumab . Most recent MRI findings showed progression of disease and was recommended to start treatment with FOLFOX. - He is currently being followed by Oncology at [facility] but wishes to switch his care to [facility]. He does have an upcoming treatment next week at [facility] and wishes to keep that but would like to transition further care to [facility]. Referral entered.   5. Aortic Root Dilatation - He had mild dilatation of the aortic root at 41 mm by recent echocardiogram. Will plan for follow-up imaging in 1 year for reassessment.  Signed, Laymon CHRISTELLA Qua, PA-C

## 2024-07-12 ENCOUNTER — Encounter: Payer: Self-pay | Admitting: Student

## 2024-07-12 DIAGNOSIS — I200 Unstable angina: Secondary | ICD-10-CM | POA: Diagnosis present

## 2024-07-12 NOTE — Discharge Summary (Deleted)
 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  Reportedly took aspirin  81 mg x 6, and nitroglycerin  x 2 before calling EMS.  In the ED, the patient was afebrile, SpO2 in the low 90s on RA, normal HR, stable BP.  Labs notable for D-dimer 3.95, troponin 26, normal CBC, normal LFTs. Troponins were flat.  CTA negative for PE but notable for new small left pleural effusion with mild pleural thickening and consolidation, liver lesions consistent with known malignancy, and peritoneal soft tissue nodularity.  EKG demonstrated sinus rhythm with inferiolateral ST segment changes.  EDP spoke with Dr. Marty, cardiology, recommended medical admission patient was started on IV heparin  drip. TTE (10/23) showed LVEF 60-65%, G1 DD, no RWMA.  Normal RV function.  No significant valvular abnormalities.  Normal IVC with low RA pressure. Underwent LHC (07/04/2024)which showed mid Cx to dist Cx lesion 80% stenosed and 2nd diagonal lesion 95% stenosed, with DES placed in distal Lcx. Was  started on DAPT with aspirin  and Brilinta for a minimum of 6 months (preferably 1 year), followed by Brilinta monotherapy indefinitely, per cardiology. He is also to increase the dose of Lipitor to 40 mg. After his LHC, he  Noted RUE pain, tender to touch along anterior biceps with mild swelling and warmth, no erythema, with interval development of bruising at and lateral to the [facility] down the forearm. Compartment is soft. Strong right radial pulse noted, without pain or swelling. Grip strength and sensation intact  RUE ultrasound was negative for DVT. The pain improved and the patient was deemed stable for discharge.        Consultants: cardiology Procedures performed: left heart cath  Disposition: Home  DISCHARGE MEDICATION: Allergies as of 07/07/2024   No Known Allergies      Medication List     PAUSE taking these medications    olmesartan  40 MG tablet Wait to take this until your doctor or other care provider tells you to start again. Please check your blood pressure twice a day-once in the morning and once at bedtime-record your readings in a log, and bring it to your next appointment with your primary care provider to help guide any needed changes to your treatment.  Commonly known as: BENICAR  TAKE ONE TABLET BY MOUTH EVERY DAY       STOP taking these medications    atorvastatin  10 MG tablet Commonly known as: LIPITOR   FLUoxetine  10 MG capsule Commonly known as: PROZAC    lidocaine -prilocaine  cream Commonly known as: EMLA        TAKE these medications    acetaminophen  500 MG tablet Commonly known  as: TYLENOL  Take 2 tablets (1,000 mg total) by mouth every 6 (six) hours. What changed:  when to take this reasons to take this   aspirin  EC 81 MG tablet Take 1 tablet (81 mg total) by mouth daily. Swallow whole.   benzonatate  200 MG capsule Commonly known as: TESSALON  Take 1 capsule (200 mg total) by mouth 3 (three) times daily as needed for cough.   bumetanide   2 MG tablet Commonly known as: BUMEX  Take 1 tablet (2 mg total) by mouth daily as needed.   buPROPion  300 MG 24 hr tablet Commonly known as: WELLBUTRIN  XL Take 300 mg by mouth daily.   cetirizine  10 MG tablet Commonly known as: ZYRTEC  Take 10 mg by mouth daily.   cyclobenzaprine  10 MG tablet Commonly known as: FLEXERIL  Take 10 mg by mouth every 6 (six) hours as needed for muscle spasms.   dexamethasone  4 MG tablet Commonly known as: DECADRON  Take 2 tablets (8 mg total) by mouth daily. Start the day after chemotherapy for 2 days. Take with food.   docusate sodium 100 MG capsule Commonly known as: COLACE Take 200 mg by mouth at bedtime.   Eszopiclone 3 MG Tabs Take 3 mg by mouth at bedtime.   famotidine  20 MG tablet Commonly known as: PEPCID  Take 20 mg by mouth at bedtime.   Farxiga 10 MG Tabs tablet Generic drug: dapagliflozin propanediol Take 10 mg by mouth daily.   fenofibrate  160 MG tablet Take 160 mg by mouth daily.   guaiFENesin 600 MG 12 hr tablet Commonly known as: MUCINEX Take 600 mg by mouth 2 (two) times daily as needed for cough or to loosen phlegm.   IMFINZI  IV Inject into the vein every 21 ( twenty-one) days.   magic mouthwash (lidocaine , diphenhydrAMINE , alum & mag hydroxide) suspension Swish and swallow 5 mLs 3 (three) times daily as needed for mouth pain.   magnesium  oxide 400 (240 Mg) MG tablet Commonly known as: MAG-OX Take 1 tablet (400 mg total) by mouth 2 (two) times daily. What changed: when to take this   metFORMIN  500 MG 24 hr tablet Commonly known as: GLUCOPHAGE -XR Take 500 mg by mouth daily with breakfast.   nitroGLYCERIN  0.4 MG SL tablet Commonly known as: NITROSTAT  Place 0.4 mg under the tongue every 5 (five) minutes x 3 doses as needed for chest pain (if no relief after 2nd dose, proceed to ED or call 911).   omeprazole 40 MG capsule Commonly known as: PRILOSEC Take 40 mg by mouth daily as needed (heartburn, acid reflux).    ondansetron  8 MG tablet Commonly known as: Zofran  Take 1 tablet (8 mg total) by mouth every 8 (eight) hours as needed for nausea or vomiting. Start on the third day after chemotherapy.   pramipexole  0.75 MG tablet Commonly known as: MIRAPEX  Take 1 tablet (0.75 mg total) by mouth 3 (three) times daily. What changed:  when to take this additional instructions   prochlorperazine  10 MG tablet Commonly known as: COMPAZINE  Take 1 tablet (10 mg total) by mouth every 6 (six) hours as needed for nausea or vomiting.   tamsulosin  0.4 MG Caps capsule Commonly known as: FLOMAX  Take 0.4 mg by mouth daily.   ticagrelor 90 MG Tabs tablet Commonly known as: BRILINTA Take 1 tablet (90 mg total) by mouth 2 (two) times daily.   traMADol  50 MG tablet Commonly known as: ULTRAM  Take 50 mg by mouth at bedtime as needed for severe pain (pain score 7-10).   traZODone   100 MG tablet Commonly known as: DESYREL  Take 1 tablet (100 mg total) by mouth at bedtime. What changed: how much to take        Follow-up Information     [name], MD Follow up in 1 week(s).   Specialty: Internal Medicine Why: scheduled patient appointment for 07/11/24 @ 3pm Contact information: [address] [place] [facility] [address] [phone]                Discharge Exam: Filed Weights   07/02/24 2338 07/04/24 1820  Weight: 123.8 kg 121.8 kg   Constitutional:      General: He is not in acute distress.    Appearance: He is not ill-appearing.  HENT:     Mouth/Throat:     Mouth: Mucous membranes are moist.  Eyes:     Pupils: Pupils are equal, round, and reactive to light.  Cardiovascular:     Rate and Rhythm: Normal rate and regular rhythm.     Heart sounds: Normal heart sounds. No murmur heard. Pulmonary:     Effort: Pulmonary effort is normal. No respiratory distress.     Breath sounds: Normal breath sounds. No wheezing.  Chest:     Comments: Right Port-A-Cath in place  Abdominal:      General: Bowel sounds are normal. There is no distension.     Palpations: Abdomen is soft.     Tenderness: There is no abdominal tenderness. There is no guarding.  Musculoskeletal:     Right upper arm: Swelling and tenderness (TTP along anterior biceps with some swelling and slight warmth. Compartment is soft. Bruising noted to the medial forearm and antecubital fossa) present.     Right wrist: No tenderness (Right radial site without pain or swelling).     Right lower leg: Edema present.     Left lower leg: Edema present.     Comments: Chronic venous insufficiency changes  Skin:    General: Skin is warm and dry.     Capillary Refill: Capillary refill takes less than 2 seconds.  Neurological:     Mental Status: He is alert and oriented to person, place, and time. Mental status is at baseline.     Condition at discharge: good  The results of significant diagnostics from this hospitalization (including imaging, microbiology, ancillary and laboratory) are listed below for reference.   Imaging Studies: VAS US  UPPER EXTREMITY VENOUS DUPLEX Result Date: 07/07/2024 UPPER VENOUS STUDY  Patient Name:  Marvin Carr  Date of Exam:   07/06/2024 Medical Rec #: [id number]        Accession #:    [id number] Date of Birth: [birth year]        Patient Gender: M Patient Age:   49 years Exam Location:  [facility] Procedure:      VAS US  UPPER EXTREMITY VENOUS DUPLEX Referring Phys: DUFFY AL-SULTANI --------------------------------------------------------------------------------  Indications: Pain Risk Factors: Trauma. Limitations: Poor ultrasound/tissue interface. Comparison Study: No prior studies. Performing Technologist: Cordella Collet RVT  Examination Guidelines: A complete evaluation includes B-mode imaging, spectral Doppler, color Doppler, and power Doppler as needed of all accessible portions of each vessel. Bilateral testing is considered an integral part of a complete examination. Limited  examinations for reoccurring indications may be performed as noted.  Right Findings: +----------+------------+---------+-----------+----------+-------+ RIGHT     CompressiblePhasicitySpontaneousPropertiesSummary +----------+------------+---------+-----------+----------+-------+ IJV           Full       Yes       Yes                      +----------+------------+---------+-----------+----------+-------+  Subclavian               Yes       Yes                      +----------+------------+---------+-----------+----------+-------+ Axillary      Full       Yes       Yes                      +----------+------------+---------+-----------+----------+-------+ Brachial      Full                                          +----------+------------+---------+-----------+----------+-------+ Radial        Full                                          +----------+------------+---------+-----------+----------+-------+ Ulnar         Full                                          +----------+------------+---------+-----------+----------+-------+ Cephalic      Full                                          +----------+------------+---------+-----------+----------+-------+ Basilic       Full                                          +----------+------------+---------+-----------+----------+-------+  Left Findings: +----------+------------+---------+-----------+----------+-------+ LEFT      CompressiblePhasicitySpontaneousPropertiesSummary +----------+------------+---------+-----------+----------+-------+ Subclavian               Yes       Yes                      +----------+------------+---------+-----------+----------+-------+  Summary:  Right: No evidence of deep vein thrombosis in the upper extremity. No evidence of superficial vein thrombosis in the upper extremity.  Left: No evidence of thrombosis in the subclavian.  *See table(s) above for measurements and  observations.  Diagnosing physician: Gaile New MD Electronically signed by Gaile New MD on 07/07/2024 at 10:25:45 AM.    Final    CARDIAC CATHETERIZATION Result Date: 07/04/2024   Mid Cx to Dist Cx lesion is 80% stenosed.   2nd Diag lesion is 95% stenosed.   A stent was successfully placed.   Post intervention, there is a 0% residual stenosis. 1.  High-grade distal left circumflex lesion treated with one 3.5 x 16 mm Synergy XD stent postdilated to 3.75 mm. 2.  LVEDP of 20 mmHg. Recommendation: Dual antiplatelet therapy for at least 6 months but preferably 1 year with aspirin  and Brilinta followed by Brilinta monotherapy indefinitely.   ECHOCARDIOGRAM COMPLETE Result Date: 07/03/2024    ECHOCARDIOGRAM REPORT   Patient Name:   Marvin Carr Date of Exam: 07/03/2024 Medical Rec #:  [id number]       Height:       72.0 in Accession #:  [id number]      Weight:       272.9 lb Date of Birth:  [birth year]       BSA:          2.432 m Patient Age:    68 years        BP:           104/70 mmHg Patient Gender: M               HR:           82 bpm. Exam Location:  [facility] Procedure: 2D Echo, Cardiac Doppler and Color Doppler (Both Spectral and Color            Flow Doppler were utilized during procedure). Indications:    Chest Pain R07.9  History:        Patient has prior history of Echocardiogram examinations, most                 recent 04/25/2023. CAD; Risk Factors:Hypertension and Sleep                 Apnea. H/O Hyperlipidemia, Dyspnea on exertion.  Sonographer:    BERNARDA ROCKS Referring Phys: [id number] SCOTESIA Y DUNLAP IMPRESSIONS  1. Left ventricular ejection fraction, by estimation, is 60 to 65%. The left ventricle has normal function. The left ventricle has no regional wall motion abnormalities. Left ventricular diastolic parameters are consistent with Grade I diastolic dysfunction (impaired relaxation).  2. Right ventricular systolic function is normal. The right ventricular size is normal.  3. The  mitral valve is normal in structure. No evidence of mitral valve regurgitation. No evidence of mitral stenosis.  4. The aortic valve was not well visualized. Aortic valve regurgitation is not visualized. No aortic stenosis is present.  5. Aortic dilatation noted. There is mild dilatation of the aortic root, measuring 41 mm.  6. The inferior vena cava is normal in size with greater than 50% respiratory variability, suggesting right atrial pressure of 3 mmHg. Comparison(s): No significant change from prior study. FINDINGS  Left Ventricle: Left ventricular ejection fraction, by estimation, is 60 to 65%. The left ventricle has normal function. The left ventricle has no regional wall motion abnormalities. Strain was performed and the global longitudinal strain is indeterminate. The left ventricular internal cavity size was normal in size. There is no left ventricular hypertrophy. Left ventricular diastolic parameters are consistent with Grade I diastolic dysfunction (impaired relaxation). Normal left ventricular filling pressure. Right Ventricle: The right ventricular size is normal. No increase in right ventricular wall thickness. Right ventricular systolic function is normal. Left Atrium: Left atrial size was normal in size. Right Atrium: Right atrial size was normal in size. Pericardium: There is no evidence of pericardial effusion. Mitral Valve: The mitral valve is normal in structure. No evidence of mitral valve regurgitation. No evidence of mitral valve stenosis. MV peak gradient, 2.6 mmHg. The mean mitral valve gradient is 2.0 mmHg. Tricuspid Valve: The tricuspid valve is normal in structure. Tricuspid valve regurgitation is not demonstrated. No evidence of tricuspid stenosis. Aortic Valve: The aortic valve was not well visualized. Aortic valve regurgitation is not visualized. No aortic stenosis is present. Aortic valve mean gradient measures 3.0 mmHg. Aortic valve peak gradient measures 7.3 mmHg. Aortic valve  area, by VTI measures 3.41 cm. Pulmonic Valve: The pulmonic valve was not well visualized. Pulmonic valve regurgitation is trivial. No evidence of pulmonic stenosis. Aorta: Aortic dilatation noted. There is mild dilatation of the aortic  root, measuring 41 mm. Venous: The inferior vena cava is normal in size with greater than 50% respiratory variability, suggesting right atrial pressure of 3 mmHg. IAS/Shunts: No atrial level shunt detected by color flow Doppler. Additional Comments: 3D was performed not requiring image post processing on an independent workstation and was indeterminate.  LEFT VENTRICLE PLAX 2D LVIDd:         4.80 cm      Diastology LVIDs:         3.20 cm      LV e' medial:    7.18 cm/s LV PW:         0.90 cm      LV E/e' medial:  8.3 LV IVS:        1.00 cm      LV e' lateral:   7.07 cm/s LVOT diam:     2.20 cm      LV E/e' lateral: 8.4 LV SV:         69 LV SV Index:   28 LVOT Area:     3.80 cm  LV Volumes (MOD) LV vol d, MOD A2C: 72.4 ml LV vol d, MOD A4C: 102.0 ml LV vol s, MOD A2C: 24.2 ml LV vol s, MOD A4C: 40.1 ml LV SV MOD A2C:     48.2 ml LV SV MOD A4C:     102.0 ml LV SV MOD BP:      55.3 ml RIGHT VENTRICLE             IVC RV Basal diam:  3.50 cm     IVC diam: 2.00 cm RV S prime:     13.50 cm/s TAPSE (M-mode): 2.2 cm LEFT ATRIUM             Index        RIGHT ATRIUM           Index LA diam:        3.40 cm 1.40 cm/m   RA Area:     13.20 cm LA Vol (A2C):   21.1 ml 8.68 ml/m   RA Volume:   29.40 ml  12.09 ml/m LA Vol (A4C):   28.8 ml 11.84 ml/m LA Biplane Vol: 25.5 ml 10.49 ml/m  AORTIC VALVE                    PULMONIC VALVE AV Area (Vmax):    2.80 cm     PV Vmax:       1.09 m/s AV Area (Vmean):   2.87 cm     PV Peak grad:  4.8 mmHg AV Area (VTI):     3.41 cm AV Vmax:           135.00 cm/s AV Vmean:          82.600 cm/s AV VTI:            0.202 m AV Peak Grad:      7.3 mmHg AV Mean Grad:      3.0 mmHg LVOT Vmax:         99.60 cm/s LVOT Vmean:        62.300 cm/s LVOT VTI:           0.181 m LVOT/AV VTI ratio: 0.90  AORTA Ao Root diam: 4.10 cm Ao Asc diam:  3.60 cm MITRAL VALVE MV Area (PHT): 3.81 cm    SHUNTS MV Area VTI:   2.78 cm    Systemic VTI:  0.18 m MV  Peak grad:  2.6 mmHg    Systemic Diam: 2.20 cm MV Mean grad:  2.0 mmHg MV Vmax:       0.80 m/s MV Vmean:      60.0 cm/s MV Decel Time: 199 msec MV E velocity: 59.70 cm/s MV A velocity: 54.00 cm/s MV E/A ratio:  1.11 Vishnu Priya Mallipeddi Electronically signed by Diannah Late Mallipeddi Signature Date/Time: 07/03/2024/3:22:31 PM    Final    CT Angio Chest PE W and/or Wo Contrast Result Date: 07/03/2024 EXAM: CTA of the Chest with contrast for PE 07/03/2024 04:45:48 AM TECHNIQUE: CTA of the chest was performed without and with the administration of intravenous contrast. 75 mL of iohexol  (OMNIPAQUE ) 350 MG/ML injection. Multiplanar reformatted images are provided for review. MIP images are provided for review. Automated exposure control, iterative reconstruction, and/or weight based adjustment of the mA/kV was utilized to reduce the radiation dose to as low as reasonably achievable. COMPARISON: 05/15/2024 CLINICAL HISTORY: Pulmonary embolism (PE) suspected, high prob. Cc center cp 3 hours Nausea no vomiting. Stabbing pain increased from 5 to 10/10 170/110 HR 78 FINDINGS: PULMONARY ARTERIES: Pulmonary arteries are adequately opacified for evaluation. No pulmonary embolism. Main pulmonary artery is normal in caliber. MEDIASTINUM: The heart and pericardium demonstrate coronary artery calcifications. Aortic atherosclerosis is present. LYMPH NODES: No mediastinal, hilar or axillary lymphadenopathy. LUNGS AND PLEURA: Atelectasis and subpleural consolidation change. New small left pleural effusion with overlying mild pleural thickening overlying the posterior right base. No pneumothorax. UPPER ABDOMEN: Corresponding to known malignancy suboptimally visualized reflecting phase of contrast enhancement. Index lesion within the anterior left  lobe of liver measures approximately 1.8 x 1.5 cm image 114/4. Previously 1.5 x 1.0 cm. Peritoneal soft tissue nodularity is identified compared peritoneal thickening within the left upper quadrant of the abdomen with adjacent nodularity compatible with known peritoneal carcinomatosis. SOFT TISSUES AND BONES: No acute or suspicious osseous findings. No acute soft tissue abnormality. IMPRESSION: 1. No evidence of pulmonary embolism. 2. New small left pleural effusion with overlying mild pleural thickening, consolidation and atelectasis. 3. Multifocal hypodense liver lesions compatible with known malignancy. Suboptimally visualized on the current exam due to phase of contrast opacification. 4. Peritoneal soft tissue nodularity compatible with peritoneal carcinomatosis. 5. Aortic atherosclerosis and coronary artery calcifications. Electronically signed by: Waddell Calk MD 07/03/2024 05:14 AM EDT RP Workstation: [id number]   DG Chest Portable 1 View Result Date: 07/03/2024 EXAM: 1 VIEW(S) XRAY OF THE CHEST 07/03/2024 12:27:00 AM COMPARISON: 06/01/2023 CLINICAL HISTORY: eval for pneumonia/chest pain. eval for pneumonia/chest pain - slight dizziness upon standing, mid AP region chest pressure, slight cough FINDINGS: LINES, TUBES AND DEVICES: Right chest port with tip in SVC. LUNGS AND PLEURA: Left basilar opacities. New small left pleural effusion. No pulmonary edema. No pneumothorax. HEART AND MEDIASTINUM: No acute abnormality of the cardiac and mediastinal silhouettes. BONES AND SOFT TISSUES: No acute osseous abnormality. IMPRESSION: 1. New small left pleural effusion with left basilar atelectasis or pneumonia. Electronically signed by: Norman Gatlin MD 07/03/2024 12:33 AM EDT RP Workstation: [id number]   US  Paracentesis Result Date: 06/13/2024 INDICATION: 69 year old male with history of cholangiocarcinoma with malignant ascites. Request made for therapeutic paracentesis. EXAM: ULTRASOUND GUIDED THERAPEUTIC  PARACENTESIS MEDICATIONS: 10 mL 1% lidocaine  COMPLICATIONS: None immediate. PROCEDURE: Informed written consent was obtained from the patient after a discussion of the risks, benefits and alternatives to treatment. A timeout was performed prior to the initiation of the procedure. Initial ultrasound scanning demonstrates a moderate amount of ascites within the  left lateral abdominal quadrant. The left lateral abdomen was prepped and draped in the usual sterile fashion. 1% lidocaine  was used for local anesthesia. Following this, a 19 gauge, 7-cm, Yueh catheter was introduced. An ultrasound image was saved for documentation purposes. The paracentesis was performed. The catheter was removed and a dressing was applied. The patient tolerated the procedure well without immediate post procedural complication. FINDINGS: A total of approximately 2.3 liters of clear, yellow fluid was removed. Samples were sent to the laboratory as requested by the clinical team. IMPRESSION: Successful ultrasound-guided paracentesis yielding 2.3 liters of peritoneal fluid. Performed by: Kacie Matthews PA-C Electronically Signed   By: Wilkie Lent M.D.   On: 06/13/2024 14:30    Microbiology: Results for orders placed or performed during the hospital encounter of 07/02/24  MRSA Next Gen by PCR, Nasal     Status: None   Collection Time: 07/04/24  7:57 PM   Specimen: Nasal Mucosa; Nasal Swab  Result Value Ref Range Status   MRSA by PCR Next Gen NOT DETECTED NOT DETECTED Final    Comment: (NOTE) The GeneXpert MRSA Assay (FDA approved for NASAL specimens only), is one component of a comprehensive MRSA colonization surveillance program. It is not intended to diagnose MRSA infection nor to guide or monitor treatment for MRSA infections. Test performance is not FDA approved in patients less than 47 years old. Performed at [facility], [address] [place], [place] [address]     Labs: CBC: Recent Labs  Lab  07/06/24 0420 07/07/24 0520  WBC 4.4 4.5  HGB 10.3* 10.1*  HCT 31.8* 31.2*  MCV 90.1 91.2  PLT 219 196   Basic Metabolic Panel: Recent Labs  Lab 07/06/24 0420 07/07/24 0520  NA 134* 132*  K 3.6 3.6  CL 100 99  CO2 23 22  GLUCOSE 111* 107*  BUN 18 15  CREATININE 0.97 0.96  CALCIUM  8.6* 8.4*   Liver Function Tests: No results for input(s): AST, ALT, ALKPHOS, BILITOT, PROT, ALBUMIN  in the last 168 hours. CBG: No results for input(s): GLUCAP in the last 168 hours.  Discharge time spent: 35 minutes  Signed: Egan Berkheimer Al-Sultani, MD [facility] 07/07/2024

## 2024-07-14 ENCOUNTER — Other Ambulatory Visit: Payer: Self-pay | Admitting: Oncology

## 2024-07-15 ENCOUNTER — Inpatient Hospital Stay: Admitting: Oncology

## 2024-07-15 ENCOUNTER — Inpatient Hospital Stay: Attending: Hematology

## 2024-07-15 ENCOUNTER — Inpatient Hospital Stay

## 2024-07-15 ENCOUNTER — Other Ambulatory Visit (HOSPITAL_COMMUNITY): Payer: Self-pay | Admitting: *Deleted

## 2024-07-15 VITALS — BP 148/93 | HR 77 | Temp 97.7°F | Resp 18

## 2024-07-15 DIAGNOSIS — Z95828 Presence of other vascular implants and grafts: Secondary | ICD-10-CM

## 2024-07-15 DIAGNOSIS — D509 Iron deficiency anemia, unspecified: Secondary | ICD-10-CM | POA: Diagnosis not present

## 2024-07-15 DIAGNOSIS — R180 Malignant ascites: Secondary | ICD-10-CM

## 2024-07-15 DIAGNOSIS — E876 Hypokalemia: Secondary | ICD-10-CM | POA: Insufficient documentation

## 2024-07-15 DIAGNOSIS — R970 Elevated carcinoembryonic antigen [CEA]: Secondary | ICD-10-CM | POA: Insufficient documentation

## 2024-07-15 DIAGNOSIS — Z5111 Encounter for antineoplastic chemotherapy: Secondary | ICD-10-CM | POA: Insufficient documentation

## 2024-07-15 DIAGNOSIS — G629 Polyneuropathy, unspecified: Secondary | ICD-10-CM | POA: Diagnosis not present

## 2024-07-15 DIAGNOSIS — G2581 Restless legs syndrome: Secondary | ICD-10-CM | POA: Insufficient documentation

## 2024-07-15 DIAGNOSIS — Z8551 Personal history of malignant neoplasm of bladder: Secondary | ICD-10-CM | POA: Diagnosis not present

## 2024-07-15 DIAGNOSIS — Z79899 Other long term (current) drug therapy: Secondary | ICD-10-CM | POA: Insufficient documentation

## 2024-07-15 DIAGNOSIS — Z96649 Presence of unspecified artificial hip joint: Secondary | ICD-10-CM | POA: Diagnosis not present

## 2024-07-15 DIAGNOSIS — Z87891 Personal history of nicotine dependence: Secondary | ICD-10-CM | POA: Diagnosis not present

## 2024-07-15 DIAGNOSIS — R059 Cough, unspecified: Secondary | ICD-10-CM

## 2024-07-15 DIAGNOSIS — J90 Pleural effusion, not elsewhere classified: Secondary | ICD-10-CM | POA: Diagnosis not present

## 2024-07-15 DIAGNOSIS — C221 Intrahepatic bile duct carcinoma: Secondary | ICD-10-CM

## 2024-07-15 DIAGNOSIS — C787 Secondary malignant neoplasm of liver and intrahepatic bile duct: Secondary | ICD-10-CM

## 2024-07-15 DIAGNOSIS — T451X5D Adverse effect of antineoplastic and immunosuppressive drugs, subsequent encounter: Secondary | ICD-10-CM | POA: Diagnosis not present

## 2024-07-15 DIAGNOSIS — G4700 Insomnia, unspecified: Secondary | ICD-10-CM | POA: Insufficient documentation

## 2024-07-15 DIAGNOSIS — R053 Chronic cough: Secondary | ICD-10-CM | POA: Insufficient documentation

## 2024-07-15 DIAGNOSIS — G620 Drug-induced polyneuropathy: Secondary | ICD-10-CM | POA: Diagnosis not present

## 2024-07-15 LAB — COMPREHENSIVE METABOLIC PANEL WITH GFR
ALT: 10 U/L (ref 0–44)
AST: 18 U/L (ref 15–41)
Albumin: 3.4 g/dL — ABNORMAL LOW (ref 3.5–5.0)
Alkaline Phosphatase: 99 U/L (ref 38–126)
Anion gap: 10 (ref 5–15)
BUN: 19 mg/dL (ref 8–23)
CO2: 28 mmol/L (ref 22–32)
Calcium: 8.8 mg/dL — ABNORMAL LOW (ref 8.9–10.3)
Chloride: 100 mmol/L (ref 98–111)
Creatinine, Ser: 0.91 mg/dL (ref 0.61–1.24)
GFR, Estimated: >60 mL/min (ref >60)
Glucose, Bld: 126 mg/dL — ABNORMAL HIGH (ref 70–99)
Potassium: 3.9 mmol/L (ref 3.5–5.1)
Sodium: 137 mmol/L (ref 135–145)
Total Bilirubin: 0.3 mg/dL (ref 0.0–1.2)
Total Protein: 6.1 g/dL — ABNORMAL LOW (ref 6.5–8.1)

## 2024-07-15 LAB — CBC WITH DIFFERENTIAL/PLATELET
Abs Immature Granulocytes: 0.01 K/uL (ref 0.00–0.07)
Basophils Absolute: 0 K/uL (ref 0.0–0.1)
Basophils Relative: 0 %
Eosinophils Absolute: 0 K/uL (ref 0.0–0.5)
Eosinophils Relative: 0 %
HCT: 32.9 % — ABNORMAL LOW (ref 39.0–52.0)
Hemoglobin: 10.2 g/dL — ABNORMAL LOW (ref 13.0–17.0)
Immature Granulocytes: 0 %
Lymphocytes Relative: 16 %
Lymphs Abs: 1 K/uL (ref 0.7–4.0)
MCH: 29.7 pg (ref 26.0–34.0)
MCHC: 31 g/dL (ref 30.0–36.0)
MCV: 95.9 fL (ref 80.0–100.0)
Monocytes Absolute: 0.7 K/uL (ref 0.1–1.0)
Monocytes Relative: 12 %
Neutro Abs: 4.2 K/uL (ref 1.7–7.7)
Neutrophils Relative %: 72 %
Platelets: 183 K/uL (ref 150–400)
RBC: 3.43 MIL/uL — ABNORMAL LOW (ref 4.22–5.81)
RDW: 17.6 % — ABNORMAL HIGH (ref 11.5–15.5)
WBC: 5.9 K/uL (ref 4.0–10.5)
nRBC: 0 % (ref 0.0–0.2)

## 2024-07-15 LAB — MAGNESIUM: Magnesium: 1.5 mg/dL — ABNORMAL LOW (ref 1.7–2.4)

## 2024-07-15 MED ORDER — PALONOSETRON HCL INJECTION 0.25 MG/5ML
0.2500 mg | Freq: Once | INTRAVENOUS | Status: AC
  Administered 2024-07-15: 0.25 mg via INTRAVENOUS
  Filled 2024-07-15: qty 5

## 2024-07-15 MED ORDER — FLUOROURACIL CHEMO INJECTION 2.5 GM/50ML
400.0000 mg/m2 | Freq: Once | INTRAVENOUS | Status: AC
  Administered 2024-07-15: 1000 mg via INTRAVENOUS
  Filled 2024-07-15: qty 20

## 2024-07-15 MED ORDER — DEXTROSE 5 % IV SOLN: INTRAVENOUS | Status: DC

## 2024-07-15 MED ORDER — LIDOCAINE VISCOUS HCL 2 % MT SOLN: 5.0000 mL | Freq: Three times a day (TID) | OROMUCOSAL | 0 refills | Status: DC | PRN

## 2024-07-15 MED ORDER — DEXAMETHASONE SOD PHOSPHATE PF 10 MG/ML IJ SOLN
10.0000 mg | Freq: Once | INTRAMUSCULAR | Status: AC
  Administered 2024-07-15: 10 mg via INTRAVENOUS

## 2024-07-15 MED ORDER — SODIUM CHLORIDE 0.9 % IV SOLN
2400.0000 mg/m2 | INTRAVENOUS | Status: DC
  Administered 2024-07-15: 6100 mg via INTRAVENOUS
  Filled 2024-07-15: qty 122

## 2024-07-15 MED ORDER — OXALIPLATIN CHEMO INJECTION 100 MG/20ML
85.0000 mg/m2 | Freq: Once | INTRAVENOUS | Status: AC
  Administered 2024-07-15: 200 mg via INTRAVENOUS
  Filled 2024-07-15: qty 40

## 2024-07-15 MED ORDER — LEUCOVORIN CALCIUM INJECTION 350 MG
394.0000 mg/m2 | Freq: Once | INTRAVENOUS | Status: AC
  Administered 2024-07-15: 1000 mg via INTRAVENOUS
  Filled 2024-07-15: qty 50

## 2024-07-15 MED ORDER — CYCLOBENZAPRINE HCL 10 MG PO TABS: 10.0000 mg | ORAL_TABLET | Freq: Four times a day (QID) | ORAL | 1 refills | Status: DC | PRN

## 2024-07-15 MED ORDER — MAGNESIUM SULFATE 2 GM/50ML IV SOLN
2.0000 g | Freq: Once | INTRAVENOUS | Status: AC
  Administered 2024-07-15: 2 g via INTRAVENOUS
  Filled 2024-07-15: qty 50

## 2024-07-15 NOTE — Patient Instructions (Signed)
 [facility] [place] - A DEPT OF [facility]  Discharge Instructions: Thank you for choosing [facility] to provide your oncology and hematology care.  If you have a lab appointment with the Cancer Center - please note that after April 8th, 2024, all labs will be drawn in the cancer center.  You do not have to check in or register with the main entrance as you have in the past but will complete your check-in in the cancer center.  Wear comfortable clothing and clothing appropriate for easy access to any Portacath or PICC line.   We strive to give you quality time with your provider. You may need to reschedule your appointment if you arrive late (15 or more minutes).  Arriving late affects you and other patients whose appointments are after yours.  Also, if you miss three or more appointments without notifying the office, you may be dismissed from the clinic at the provider's discretion.      For prescription refill requests, have your pharmacy contact our office and allow 72 hours for refills to be completed.    Today you received the following chemotherapy and/or immunotherapy agents Folfox   To help prevent nausea and vomiting after your treatment, we encourage you to take your nausea medication as directed.  Oxaliplatin Injection What is this medication? OXALIPLATIN (ox AL i PLA tin) treats colorectal cancer. It works by slowing down the growth of cancer cells. This medicine may be used for other purposes; ask your health care provider or pharmacist if you have questions. COMMON BRAND NAME(S): Eloxatin What should I tell my care team before I take this medication? They need to know if you have any of these conditions: Heart disease History of irregular heartbeat or rhythm Liver disease Low blood cell levels (white cells, red cells, and platelets) Lung or breathing disease, such as asthma Take medications that treat or prevent blood clots Tingling of  the fingers, toes, or other nerve disorder An unusual or allergic reaction to oxaliplatin, other medications, foods, dyes, or preservatives If you or your partner are pregnant or trying to get pregnant Breast-feeding How should I use this medication? This medication is injected into a vein. It is given by your care team in a hospital or clinic setting. Talk to your care team about the use of this medication in children. Special care may be needed. Overdosage: If you think you have taken too much of this medicine contact a poison control center or emergency room at once. NOTE: This medicine is only for you. Do not share this medicine with others. What if I miss a dose? Keep appointments for follow-up doses. It is important not to miss a dose. Call your care team if you are unable to keep an appointment. What may interact with this medication? Do not take this medication with any of the following: Cisapride Dronedarone Pimozide Thioridazine This medication may also interact with the following: Aspirin and aspirin-like medications Certain medications that treat or prevent blood clots, such as warfarin, apixaban, dabigatran, and rivaroxaban Cisplatin Cyclosporine Diuretics Medications for infection, such as acyclovir, adefovir, amphotericin B, bacitracin, cidofovir, foscarnet, ganciclovir, gentamicin, pentamidine, vancomycin NSAIDs, medications for pain and inflammation, such as ibuprofen or naproxen Other medications that cause heart rhythm changes Pamidronate Zoledronic acid This list may not describe all possible interactions. Give your health care provider a list of all the medicines, herbs, non-prescription drugs, or dietary supplements you use. Also tell them if you  smoke, drink alcohol, or use illegal drugs. Some items may interact with your medicine. What should I watch for while using this medication? Your condition will be monitored carefully while you are receiving this  medication. You may need blood work while taking this medication. This medication may make you feel generally unwell. This is not uncommon as chemotherapy can affect healthy cells as well as cancer cells. Report any side effects. Continue your course of treatment even though you feel ill unless your care team tells you to stop. This medication may increase your risk of getting an infection. Call your care team for advice if you get a fever, chills, sore throat, or other symptoms of a cold or flu. Do not treat yourself. Try to avoid being around people who are sick. Avoid taking medications that contain aspirin, acetaminophen, ibuprofen, naproxen, or ketoprofen unless instructed by your care team. These medications may hide a fever. Be careful brushing or flossing your teeth or using a toothpick because you may get an infection or bleed more easily. If you have any dental work done, tell your dentist you are receiving this medication. This medication can make you more sensitive to cold. Do not drink cold drinks or use ice. Cover exposed skin before coming in contact with cold temperatures or cold objects. When out in cold weather wear warm clothing and cover your mouth and nose to warm the air that goes into your lungs. Tell your care team if you get sensitive to the cold. Talk to your care team if you or your partner are pregnant or think either of you might be pregnant. This medication can cause serious birth defects if taken during pregnancy and for 9 months after the last dose. A negative pregnancy test is required before starting this medication. A reliable form of contraception is recommended while taking this medication and for 9 months after the last dose. Talk to your care team about effective forms of contraception. Do not father a child while taking this medication and for 6 months after the last dose. Use a condom while having sex during this time period. Do not breastfeed while taking this  medication and for 3 months after the last dose. This medication may cause infertility. Talk to your care team if you are concerned about your fertility. What side effects may I notice from receiving this medication? Side effects that you should report to your care team as soon as possible: Allergic reactions--skin rash, itching, hives, swelling of the face, lips, tongue, or throat Bleeding--bloody or black, tar-like stools, vomiting blood or brown material that looks like coffee grounds, red or dark brown urine, small red or purple spots on skin, unusual bruising or bleeding Dry cough, shortness of breath or trouble breathing Heart rhythm changes--fast or irregular heartbeat, dizziness, feeling faint or lightheaded, chest pain, trouble breathing Infection--fever, chills, cough, sore throat, wounds that don't heal, pain or trouble when passing urine, general feeling of discomfort or being unwell Liver injury--right upper belly pain, loss of appetite, nausea, light-colored stool, dark yellow or brown urine, yellowing skin or eyes, unusual weakness or fatigue Low red blood cell level--unusual weakness or fatigue, dizziness, headache, trouble breathing Muscle injury--unusual weakness or fatigue, muscle pain, dark yellow or brown urine, decrease in amount of urine Pain, tingling, or numbness in the hands or feet Sudden and severe headache, confusion, change in vision, seizures, which may be signs of posterior reversible encephalopathy syndrome (PRES) Unusual bruising or bleeding Side effects that usually do not  require medical attention (report to your care team if they continue or are bothersome): Diarrhea Nausea Pain, redness, or swelling with sores inside the mouth or throat Unusual weakness or fatigue Vomiting This list may not describe all possible side effects. Call your doctor for medical advice about side effects. You may report side effects to FDA at 1-800-FDA-1088. Where should I keep my  medication? This medication is given in a hospital or clinic. It will not be stored at home. NOTE: This sheet is a summary. It may not cover all possible information. If you have questions about this medicine, talk to your doctor, pharmacist, or health care provider.  2024 Elsevier/Gold Standard (2023-08-10 00:00:00)  Leucovorin Injection What is this medication? LEUCOVORIN (loo koe VOR in) prevents side effects from certain medications, such as methotrexate. It works by increasing folate levels. This helps protect healthy cells in your body. It may also be used to treat anemia caused by low levels of folate. It can also be used with fluorouracil, a type of chemotherapy, to treat colorectal cancer. It works by increasing the effects of fluorouracil in the body. This medicine may be used for other purposes; ask your health care provider or pharmacist if you have questions. What should I tell my care team before I take this medication? They need to know if you have any of these conditions: Anemia from low levels of vitamin B12 in the blood An unusual or allergic reaction to leucovorin, folic acid, other medications, foods, dyes, or preservatives Pregnant or trying to get pregnant Breastfeeding How should I use this medication? This medication is injected into a vein or a muscle. It is given by your care team in a hospital or clinic setting. Talk to your care team about the use of this medication in children. Special care may be needed. Overdosage: If you think you have taken too much of this medicine contact a poison control center or emergency room at once. NOTE: This medicine is only for you. Do not share this medicine with others. What if I miss a dose? Keep appointments for follow-up doses. It is important not to miss your dose. Call your care team if you are unable to keep an appointment. What may interact with this  medication? Capecitabine Fluorouracil Phenobarbital Phenytoin Primidone Trimethoprim;sulfamethoxazole This list may not describe all possible interactions. Give your health care provider a list of all the medicines, herbs, non-prescription drugs, or dietary supplements you use. Also tell them if you smoke, drink alcohol, or use illegal drugs. Some items may interact with your medicine. What should I watch for while using this medication? Your condition will be monitored carefully while you are receiving this medication. This medication may increase the side effects of 5-fluorouracil. Tell your care team if you have diarrhea or mouth sores that do not get better or that get worse. What side effects may I notice from receiving this medication? Side effects that you should report to your care team as soon as possible: Allergic reactions--skin rash, itching, hives, swelling of the face, lips, tongue, or throat This list may not describe all possible side effects. Call your doctor for medical advice about side effects. You may report side effects to FDA at 1-800-FDA-1088. Where should I keep my medication? This medication is given in a hospital or clinic. It will not be stored at home. NOTE: This sheet is a summary. It may not cover all possible information. If you have questions about this medicine, talk to your doctor, pharmacist,  or health care provider.  2024 Elsevier/Gold Standard (2022-01-31 00:00:00)  Fluorouracil Injection What is this medication? FLUOROURACIL (flure oh YOOR a sil) treats some types of cancer. It works by slowing down the growth of cancer cells. This medicine may be used for other purposes; ask your health care provider or pharmacist if you have questions. COMMON BRAND NAME(S): Adrucil What should I tell my care team before I take this medication? They need to know if you have any of these conditions: Blood disorders Dihydropyrimidine dehydrogenase (DPD)  deficiency Infection, such as chickenpox, cold sores, herpes Kidney disease Liver disease Poor nutrition Recent or ongoing radiation therapy An unusual or allergic reaction to fluorouracil, other medications, foods, dyes, or preservatives If you or your partner are pregnant or trying to get pregnant Breast-feeding How should I use this medication? This medication is injected into a vein. It is administered by your care team in a hospital or clinic setting. Talk to your care team about the use of this medication in children. Special care may be needed. Overdosage: If you think you have taken too much of this medicine contact a poison control center or emergency room at once. NOTE: This medicine is only for you. Do not share this medicine with others. What if I miss a dose? Keep appointments for follow-up doses. It is important not to miss your dose. Call your care team if you are unable to keep an appointment. What may interact with this medication? Do not take this medication with any of the following: Live virus vaccines This medication may also interact with the following: Medications that treat or prevent blood clots, such as warfarin, enoxaparin, dalteparin This list may not describe all possible interactions. Give your health care provider a list of all the medicines, herbs, non-prescription drugs, or dietary supplements you use. Also tell them if you smoke, drink alcohol, or use illegal drugs. Some items may interact with your medicine. What should I watch for while using this medication? Your condition will be monitored carefully while you are receiving this medication. This medication may make you feel generally unwell. This is not uncommon as chemotherapy can affect healthy cells as well as cancer cells. Report any side effects. Continue your course of treatment even though you feel ill unless your care team tells you to stop. In some cases, you may be given additional medications  to help with side effects. Follow all directions for their use. This medication may increase your risk of getting an infection. Call your care team for advice if you get a fever, chills, sore throat, or other symptoms of a cold or flu. Do not treat yourself. Try to avoid being around people who are sick. This medication may increase your risk to bruise or bleed. Call your care team if you notice any unusual bleeding. Be careful brushing or flossing your teeth or using a toothpick because you may get an infection or bleed more easily. If you have any dental work done, tell your dentist you are receiving this medication. Avoid taking medications that contain aspirin, acetaminophen, ibuprofen, naproxen, or ketoprofen unless instructed by your care team. These medications may hide a fever. Do not treat diarrhea with over the counter products. Contact your care team if you have diarrhea that lasts more than 2 days or if it is severe and watery. This medication can make you more sensitive to the sun. Keep out of the sun. If you cannot avoid being in the sun, wear protective clothing and sunscreen.  Do not use sun lamps, tanning beds, or tanning booths. Talk to your care team if you or your partner wish to become pregnant or think you might be pregnant. This medication can cause serious birth defects if taken during pregnancy and for 3 months after the last dose. A reliable form of contraception is recommended while taking this medication and for 3 months after the last dose. Talk to your care team about effective forms of contraception. Do not father a child while taking this medication and for 3 months after the last dose. Use a condom while having sex during this time period. Do not breastfeed while taking this medication. This medication may cause infertility. Talk to your care team if you are concerned about your fertility. What side effects may I notice from receiving this medication? Side effects that you  should report to your care team as soon as possible: Allergic reactions--skin rash, itching, hives, swelling of the face, lips, tongue, or throat Heart attack--pain or tightness in the chest, shoulders, arms, or jaw, nausea, shortness of breath, cold or clammy skin, feeling faint or lightheaded Heart failure--shortness of breath, swelling of the ankles, feet, or hands, sudden weight gain, unusual weakness or fatigue Heart rhythm changes--fast or irregular heartbeat, dizziness, feeling faint or lightheaded, chest pain, trouble breathing High ammonia level--unusual weakness or fatigue, confusion, loss of appetite, nausea, vomiting, seizures Infection--fever, chills, cough, sore throat, wounds that don't heal, pain or trouble when passing urine, general feeling of discomfort or being unwell Low red blood cell level--unusual weakness or fatigue, dizziness, headache, trouble breathing Pain, tingling, or numbness in the hands or feet, muscle weakness, change in vision, confusion or trouble speaking, loss of balance or coordination, trouble walking, seizures Redness, swelling, and blistering of the skin over hands and feet Severe or prolonged diarrhea Unusual bruising or bleeding Side effects that usually do not require medical attention (report to your care team if they continue or are bothersome): Dry skin Headache Increased tears Nausea Pain, redness, or swelling with sores inside the mouth or throat Sensitivity to light Vomiting This list may not describe all possible side effects. Call your doctor for medical advice about side effects. You may report side effects to FDA at 1-800-FDA-1088. Where should I keep my medication? This medication is given in a hospital or clinic. It will not be stored at home. NOTE: This sheet is a summary. It may not cover all possible information. If you have questions about this medicine, talk to your doctor, pharmacist, or health care provider.  2024 Elsevier/Gold  Standard (2022-01-03 00:00:00)  BELOW ARE SYMPTOMS THAT SHOULD BE REPORTED IMMEDIATELY: *FEVER GREATER THAN 100.4 F (38 C) OR HIGHER *CHILLS OR SWEATING *NAUSEA AND VOMITING THAT IS NOT CONTROLLED WITH YOUR NAUSEA MEDICATION *UNUSUAL SHORTNESS OF BREATH *UNUSUAL BRUISING OR BLEEDING *URINARY PROBLEMS (pain or burning when urinating, or frequent urination) *BOWEL PROBLEMS (unusual diarrhea, constipation, pain near the anus) TENDERNESS IN MOUTH AND THROAT WITH OR WITHOUT PRESENCE OF ULCERS (sore throat, sores in mouth, or a toothache) UNUSUAL RASH, SWELLING OR PAIN  UNUSUAL VAGINAL DISCHARGE OR ITCHING   Items with * indicate a potential emergency and should be followed up as soon as possible or go to the Emergency Department if any problems should occur.  Please show the CHEMOTHERAPY ALERT CARD or IMMUNOTHERAPY ALERT CARD at check-in to the Emergency Department and triage nurse.  Should you have questions after your visit or need to cancel or reschedule your appointment, please contact [facility]  [place] - A DEPT OF [facility] [phone]  and follow the prompts.  Office hours are 8:00 a.m. to 4:30 p.m. Monday - Friday. Please note that voicemails left after 4:00 p.m. may not be returned until the following business day.  We are closed weekends and major holidays. You have access to a nurse at all times for urgent questions. Please call the main number to the clinic [phone] and follow the prompts.  For any non-urgent questions, you may also contact your provider using MyChart. We now offer e-Visits for anyone 28 and older to request care online for non-urgent symptoms. For details visit [email or web address].   Also download the MyChart app! Go to the app store, search "MyChart", open the app, select [facility], and log in with your MyChart username and password.

## 2024-07-15 NOTE — Progress Notes (Unsigned)
 Patient Care Team: [name], MD as PCP - General (Internal Medicine) Debera Jayson MATSU, MD as PCP - Cardiology (Cardiology) Debera Jayson MATSU, MD as Consulting Physician (Cardiology) Onita Duos, MD as Consulting Physician (Neurology) Darlean Ozell NOVAK, MD as Consulting Physician (Pulmonary Disease) Johnson Laymon CHRISTELLA RIGGERS as Physician Assistant (Cardiology)  Clinic Day:  07/16/2024  Referring physician: [name], MD   CHIEF COMPLAINT:  CC: Cholangiocarcinoma with Liver lesion/peritoneal carcinomatosis    ASSESSMENT & PLAN:   Assessment & Plan: Marvin Carr  is a 68 y.o. 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 S/p first-line treatment with gemcitabine , cisplatin  and durvalumab  and durvalumab  maintenance CT with progression of disease in 09/2023 and 11/2023.  Restarted patient on gemcitabine  and cisplatin  along with durvalumab  Patient missed chemotherapy of about 2 cycles because of hip replacement Uptrending CA 19-9 Most recent MRI shows clear progression of disease in the liver with increase in size and also worsening peritoneal carcinomatosis. Treatment was switched to FOLFOX which he started on 07/01/2024.  He is here today for cycle 2.   Patient reports she has second opinion with Dr. [name].  Reviewed labs from which show hemoglobin of 10.2, normal white count and platelet count.  Differential is unremarkable.  CMP shows mildly low magnesium  level 1.5 with normal kidney function and LFTs. Proceed with cycle 2 FOLFOX.  He will return to clinic on Thursday for pump DC. Spoke with pharmacy regarding dose of FOLFOX given recent chest pain and stent placement.  Chest pain started prior to placement of pump and we feel unrelated to actual event.  Patient to let us  know if  he develops chest pain.  - Continue medications for nausea, vomiting, and diarrhea. - Will repeat imaging 3 months after starting treatment. - Will continue to trend CA 19-9 with every other cycle  Return to clinic in 2 weeks with labs and see MD per patient request.  Peripheral neuropathy Patient has neuropathy likely secondary to chemotherapy Previously taking gabapentin  but has not been taking it right now Stable at this time. Potential side effect of new chemotherapy. Risk of exacerbation with treatment.  - Monitor for worsening neuropathy symptoms and report changes.  Restless legs syndrome Well-managed with Mirapex  and iron supplementation. Previous low iron levels may have contributed.  - Continue Mirapex . - Recheck iron levels and supplement if low.  Iron deficiency Previously treated, contributing to improvement in restless legs syndrome.  - Recheck iron levels and supplement if low.  Chronic insomnia Not well-controlled. Trazodone  effective but caused daytime drowsiness.  -Will prescribe trazodone , advised taking earlier in the evening to minimize daytime drowsiness.  Hypomagnesemia Magnesium  level at 1.5. No diarrhea from supplementation.  -Patient reports he will increase magnesium  to 2 tablets every other day for the next 2 weeks. - Recheck labs in 2 weeks prior to next treatment.  Chronic cough Persistent, dry cough. Tensilon provides minimal relief. Dry, forceful cough occasionally leads to retching.  - Monitor cough symptoms and report changes.  Malignant ascites Malignant ascites previously managed with frequent paracentesis, now with decreased frequency of paracentesis and use of Bumex  for swelling. Last paracentesis was 06/13/2024 with 2.5 L taken out   - Continue Bumex  for fluid management. - Monitor weight and fluid retention.  Hypokalemia Potassium 3.9 today.   Continue to hold potassium supplements now.   The patient understands the  plans  discussed today and is in agreement with them.  He knows to contact our office if he develops concerns prior to his next appointment.  The total time spent in the appointment was 20 minutes for the encounter with patient, including review of chart and various tests results, discussions about plan of care and coordination of care plan    Delon FORBES Hope, NP  [facility] - A DEPT OF [facility] [address] [employer] [place] [address] Dept: [phone] Dept Fax: [phone]   No orders of the defined types were placed in this encounter.    ONCOLOGY HISTORY:   I have reviewed his chart and materials related to his cancer extensively and collaborated history with the patient. 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 Around this time the patient intentionally lost 20 pounds in 3-4 months by cutting back on sugar in his diet and was started on semaglutide .  -08/29/2021: CT AP: Ill-defined low-density lesions within the liver measuring up to 6.1 cm. Nodularity throughout the peritoneum/omentum anteriorly in the abdomen and pelvis concerning for peritoneal tumor. -09/01/2021: CEA:34.3. CA 19-9: 9. LDH: 242.  -09/08/2021: Initial PET: Signs of hepatic metastatic disease and diffuse peritoneal carcinomatosis. Gastric antral involvement from peritoneal disease is contiguous with hepatic metastatic disease. Nodal involvement in the upper abdomen. Stranding and nodularity throughout the peritoneum including about the gallbladder is related to peritoneal carcinomatosis. -09/15/2021: Liver biopsy.  -Pathology: Poorly differentiated adenocarcinoma with necrosis. Tumor cells are positive for CK7 and CDX2 (weak, focal) and negative for  GATA3.  The findings are suggestive of an upper gastrointestinal or pancreatobiliary primary.  In absence of any other lesions, findings are compatible with a primary cholangiocarcinoma.  -Cytology: Malignant cells consistent with adenocarcinoma.  -NGS: No targetable mutations. PD-L1 (SP142):Negative,0%. 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  -MSI-Stable, p-MMR,TMB: low-35mut/Mb  -BRAF,NTRK1/2/3,RET,BRCA1/2,HER2,FGFR2/3,IDH1/2,KRAS: Negative -09/21/2021: MRI brain: No acute intracranial pathology or evidence of intracranialmetastatic disease. -09/23/2021: EGD : 2 cm hiatal hernia, congestive gastropathy, and normal duodenum.  Colonoscopy: One 6 mm polyp in the transverse colon. Diverticulosis on the sigmoid colon and descending colon. Normal distal rectum and anal verge.  -10/05/2021 -05/31/2022: Gemcitabine , cisplatin  and durvalumab   -12/20/2021-05/23/2022: CT CAP: Treatment response -07/12/2022- 11/07/2023: Maintenance Single-agent durvalumab   -08/07/2022- 06/01/2023: CT CAP: NED/stable findings -10/02/2023: CT CAP: Increasing, developing areas of subtle peritoneal nodularity worrisome for spread of disease. No significant ascites. Scattered mesenteric stranding. Persistent wall thickening and stones in the gallbladder with slight stranding. -11/29/2023: CT CAP: Increased burden of peritoneal/mesenteric nodularity and new trace perihepatic fluid and increased mesenteric edema, concerning for worsening peritoneal carcinomatosis. -12/07/2023 to current: Gemcitabine , cisplatin  and durvalumab  restarted. Patient missed chemotherapy from 12/2023-03/10/2024 for hip surgery. Restarted cycle 2 on 03/10/2024 -04/01/2024: CA 19-9:133, CEA:11.1 -04/04/2024: CT CAP: Subtle hypodensity in the RIGHT hepatic lobe is indeterminate. Peritoneal pleural fluid and nodularity appears slightly improved. Small amount of fluid along the margin of the liver is slightly increased.  - 04/08/2024: MRI liver: Multiple small  metastatic lesions involving both lobes of the liver.  Omental and peritoneal surface disease throughout the upper abdomen, better seen on the CT scan. -04/23/2024: CA 19-9:191, CEA:13.2 -05/13/2024: CA 19-9:293, CEA:13.6 -05/15/2024: Hypodense lesions in the liver correspond to previous ring-enhancing lesions on the MRI from  04/08/2024. The hypodensities are more striking compared to the 04/04/2024 CT exam, and mildly enlarged compared to the prior MRI. No new lesions identified. Increased ascites, with some faint areas of enhancement along the ascites suspicious for peritoneal carcinomatosis. -06/03/2024: MRI abdomen: Redemonstration of multiple hepatic metastatic lesions, which exhibit interval increase in size, compatible with worsening metastatic disease. No discrete new liver lesion seen.There is mild-to-moderate omental and peritoneal nodularity, compatible with peritoneal carcinomatosis. When compared to the prior exam there is mild interval increase.   Diagnosis: Bladder Cancer  -04/07/2010: TURBT.  Pathology: Low grade papillary urothelial carcinoma. Cytology of bladder washing was positive for atypical urothelial cells.  -Patient reportedly received 1 treatment of intravesical chemotherapy and has been on surveillance since  Current Treatment:  Gemcitabine , cisplatin  and durvalumab    INTERVAL HISTORY:   Discussed the use of AI scribe software for clinical note transcription with the patient, who gave verbal consent to proceed.  History of Present Illness Marvin Carr is a 68 year old male with stage IV cholangiocarcinoma cancer who presents for follow-up.  He recently underwent paracentesis with the removal of 2.5 liters of fluid from his abdomen.   Patient recently started a new regimen with FOLFOX and is status post 1 cycle with pump DC'd a few hours early due to admission see below..    In the interim, developed chest pain (a day after pump was placed) and was seen in  the ED.  D-dimer was elevated troponin 26 with normal CBC.  Troponins flat.  CTA negative for PE but notable new small pleural effusion.  EKG showed sinus rhythm with ST segment changes.  He was started on IV heparin .  TTE on 1023 showed 60 to 65% ejection fraction.  Underwent LHC on 07/04/2024 which showed 80% stenosed and second diagonal lesion 95% stenosed.  Was started on DAPT with aspirin  and Brilinta.  His Lipitor was also increased.  He denies any additional chest pain since this incident.  He has follow-up with cardiology.  Reports overall tolerating his first cycle well.  He has chronic cough and shortness of breath.  Appetite and energy levels are 100%.  Denies any pain.  Has constipation, nausea, dizziness and numbness and burning in his toes.  He was prescribed gabapentin  but he is currently not taking.  He is taking potassium supplements. He continues to take magnesium  without experiencing diarrhea. For sleep, he takes Lunesta, which is not very effective, and has tried trazodone , which he found helpful despite some initial side effects.  Reports he has second opinion at [facility] next week with Dr. [name].  He lives alone and does not have children. He has cats as pets.  I have reviewed the past medical history, past surgical history, social history and family history with the patient and they are unchanged from previous note.  ALLERGIES:  has no known allergies.  MEDICATIONS:  Current Outpatient Medications  Medication Sig Dispense Refill   acetaminophen  (TYLENOL ) 500 MG tablet Take 2 tablets (1,000 mg total) by mouth every 6 (six) hours.     aspirin  EC 81 MG tablet Take 1 tablet (81 mg total) by mouth daily. Swallow whole. 30 tablet 0   atorvastatin  (LIPITOR) 20 MG tablet Take 1 tablet (20 mg total) by mouth daily. 90 tablet 3   benzonatate  (TESSALON ) 200 MG capsule Take 1 capsule (200 mg total) by mouth 3 (three) times daily as needed for cough. 30 capsule 0   bumetanide  (BUMEX )  2 MG tablet Take  1 tablet (2 mg total) by mouth daily as needed. 90 tablet 1   buPROPion  (WELLBUTRIN  XL) 300 MG 24 hr tablet Take 300 mg by mouth daily.     cetirizine  (ZYRTEC ) 10 MG tablet Take 10 mg by mouth daily.     dapagliflozin propanediol (FARXIGA) 10 MG TABS tablet Take 10 mg by mouth daily.     dexamethasone  (DECADRON ) 4 MG tablet Take 2 tablets (8 mg total) by mouth daily. Start the day after chemotherapy for 2 days. Take with food. 30 tablet 1   docusate sodium (COLACE) 100 MG capsule Take 200 mg by mouth at bedtime.     Durvalumab  (IMFINZI  IV) Inject into the vein every 21 ( twenty-one) days.     Eszopiclone 3 MG TABS Take 3 mg by mouth at bedtime.     famotidine  (PEPCID ) 20 MG tablet Take 20 mg by mouth at bedtime.     fenofibrate  160 MG tablet Take 160 mg by mouth daily.     guaiFENesin (MUCINEX) 600 MG 12 hr tablet Take 600 mg by mouth 2 (two) times daily as needed for cough or to loosen phlegm.     magnesium  oxide (MAG-OX) 400 (240 Mg) MG tablet Take 1 tablet (400 mg total) by mouth 2 (two) times daily. (Patient taking differently: Take 200 mg by mouth daily.) 90 tablet 6   metFORMIN  (GLUCOPHAGE -XR) 500 MG 24 hr tablet Take 500 mg by mouth daily with breakfast.     nitroGLYCERIN  (NITROSTAT ) 0.4 MG SL tablet Place 0.4 mg under the tongue every 5 (five) minutes x 3 doses as needed for chest pain (if no relief after 2nd dose, proceed to ED or call 911).     [Paused] olmesartan  (BENICAR ) 40 MG tablet TAKE ONE TABLET BY MOUTH EVERY DAY 90 tablet 2   omeprazole (PRILOSEC) 40 MG capsule Take 40 mg by mouth daily as needed (heartburn, acid reflux).     ondansetron  (ZOFRAN ) 8 MG tablet Take 1 tablet (8 mg total) by mouth every 8 (eight) hours as needed for nausea or vomiting. Start on the third day after chemotherapy. 30 tablet 1   pramipexole  (MIRAPEX ) 0.75 MG tablet Take 1 tablet (0.75 mg total) by mouth 3 (three) times daily. 90 tablet 0   prochlorperazine  (COMPAZINE ) 10 MG tablet Take 1  tablet (10 mg total) by mouth every 6 (six) hours as needed for nausea or vomiting. 30 tablet 1   tamsulosin  (FLOMAX ) 0.4 MG CAPS capsule Take 0.4 mg by mouth daily.     ticagrelor (BRILINTA) 90 MG TABS tablet Take 1 tablet (90 mg total) by mouth 2 (two) times daily. 60 tablet 0   traMADol  (ULTRAM ) 50 MG tablet Take 50 mg by mouth at bedtime as needed for severe pain (pain score 7-10).     traZODone  (DESYREL ) 100 MG tablet Take 1 tablet (100 mg total) by mouth at bedtime. 30 tablet 0   triamcinolone (KENALOG) 0.1 % paste SMARTSIG:TO TEETH     amoxicillin (AMOXIL) 500 MG capsule SMARTSIG:4 Capsule(s) By Mouth As Directed     cyclobenzaprine  (FLEXERIL ) 10 MG tablet Take 1 tablet (10 mg total) by mouth every 6 (six) hours as needed for muscle spasms. 30 tablet 1   magic mouthwash (lidocaine , diphenhydrAMINE , alum & mag hydroxide) suspension Swish and swallow 5 mLs 3 (three) times daily as needed for mouth pain. 360 mL 0   No current facility-administered medications for this visit.   Facility-Administered Medications Ordered in Other Visits  Medication Dose  Route Frequency Provider Last Rate Last Admin   magnesium  sulfate 2 GM/50ML IVPB             REVIEW OF SYSTEMS:   Review of Systems  Constitutional:  Positive for malaise/fatigue.  Respiratory:  Positive for cough and shortness of breath.   Cardiovascular:  Negative for chest pain.  Gastrointestinal:  Positive for constipation and nausea.  Neurological:  Positive for dizziness, tingling and sensory change.  Psychiatric/Behavioral:  The patient is nervous/anxious.       VITALS:  There were no vitals taken for this visit.  Wt Readings from Last 3 Encounters:  07/15/24 268 lb 1.3 oz (121.6 kg)  07/11/24 270 lb (122.5 kg)  07/04/24 268 lb 8.3 oz (121.8 kg)    There is no height or weight on file to calculate BMI.  Performance status (ECOG): 1 - Symptomatic but completely ambulatory  PHYSICAL EXAM:   GENERAL:alert, no distress  and comfortable, obese LYMPH:  no palpable lymphadenopathy in the cervical, axillary or inguinal LUNGS: clear to auscultation and percussion with normal breathing effort HEART: regular rate & rhythm and no murmurs and B/L pitting pedal edema upto the knees ABDOMEN:abdomen soft, non-tender and normal bowel sounds Musculoskeletal:no cyanosis of digits and no clubbing  NEURO: alert & oriented x 3 with fluent speech, no focal motor/sensory deficits  LABORATORY DATA:  I have reviewed the data as listed   Lab Results  Component Value Date   WBC 5.9 07/15/2024   NEUTROABS 4.2 07/15/2024   HGB 10.2 (L) 07/15/2024   HCT 32.9 (L) 07/15/2024   MCV 95.9 07/15/2024   PLT 183 07/15/2024     Chemistry      Component Value Date/Time   NA 137 07/15/2024 0839   K 3.9 07/15/2024 0839   CL 100 07/15/2024 0839   CO2 28 07/15/2024 0839   BUN 19 07/15/2024 0839   CREATININE 0.91 07/15/2024 0839      Component Value Date/Time   CALCIUM  8.8 (L) 07/15/2024 0839   ALKPHOS 99 07/15/2024 0839   AST 18 07/15/2024 0839   ALT 10 07/15/2024 0839   BILITOT 0.3 07/15/2024 0839       Latest Reference Range & Units 04/23/24 12:45  Iron 45 - 182 ug/dL 24 (L)  UIBC ug/dL 648  TIBC 749 - 549 ug/dL 624  Saturation Ratios 17.9 - 39.5 % 6 (L)  Ferritin 24 - 336 ng/mL 84  (L): Data is abnormally low   Latest Reference Range & Units 06/10/24 07:56  CA 19-9 0 - 35 U/mL 450 (H)  CEA 0.0 - 4.7 ng/mL 18.7 (H)  (H): Data is abnormally high   Latest Reference Range & Units 06/10/24 07:56  TSH 0.350 - 4.500 uIU/mL 3.769  Thyroxine (T4) 4.5 - 12.0 ug/dL 8.6   RADIOGRAPHIC STUDIES: I have personally reviewed the radiological images as listed and agreed with the findings in the report.  CLINICAL DATA:  Gallbladder/biliary cancer, monitor.   EXAM: MRI ABDOMEN WITHOUT AND WITH CONTRAST   TECHNIQUE: Multiplanar multisequence MR imaging of the abdomen was performed both before and after the administration of  intravenous contrast.   CONTRAST:  10mL GADAVIST  GADOBUTROL  1 MMOL/ML IV SOLN   COMPARISON:  CT scan chest, abdomen and pelvis from 05/15/24 and MRI abdomen from 04/08/2024.   FINDINGS: Lower chest: There is trace left pleural effusion, slightly increased since the prior study. Otherwise, unremarkable MR appearance to the lung bases. No right pleural effusion. No pericardial effusion. Normal heart size.  Hepatobiliary: The liver is normal in size. Noncirrhotic configuration. There is moderate diffuse hepatic steatosis. Redemonstration of multiple (more than 10) mildly T2 hyperintense, rim enhancing metastatic lesions throughout the liver the largest in the subcapsular left hepatic lobe, segment 4A measuring 15 x 18 mm. The lesion previously measured 10 x 11 mm. Other smaller lesions also exhibit interval growth. Findings are compatible with worsening metastatic disease. No discrete new liver lesions seen. No intrahepatic or extrahepatic bile duct dilatation. No choledocholithiasis. Gallbladder is distended and exhibit moderate volume gallstones. No abnormal wall thickening or pericholecystic fat stranding. No imaging evidence of acute cholecystitis.   Pancreas: No mass, inflammatory changes or other parenchymal abnormality identified. No main pancreatic duct dilation.   Spleen:  Within normal limits in size and appearance. No focal mass.   Adrenals/Urinary Tract: Unremarkable adrenal glands. No hydroureteronephrosis. There are several bilateral simple renal cysts with largest in the left kidney interpolar region measuring up to 1.4 x 1.6 cm. No suspicious in the lesion.   Stomach/Bowel: Visualized portions within the abdomen are unremarkable. No disproportionate dilation of bowel loops. Unremarkable appendix. There are scattered colonic diverticula without diverticulitis.   Vascular/Lymphatic: There is mild-to-moderate omental and peritoneal nodularity, compatible with  peritoneal carcinomatosis. When compared to the prior exam there is mild interval increase. There is also interval increase amount of ascites, which can not be quantified as mild-to-moderate on today's exam. No pathologically enlarged lymph nodes identified. No abdominal aortic aneurysm demonstrated.   Other: None.   Musculoskeletal: No suspicious bone lesions identified.   IMPRESSION: 1. Redemonstration of multiple hepatic metastatic lesions, which exhibit interval increase in size, compatible with worsening metastatic disease. No discrete new liver lesion seen. 2. There is mild-to-moderate omental and peritoneal nodularity, compatible with peritoneal carcinomatosis. When compared to the prior exam there is mild interval increase. There is also interval increase amount of ascites, which can not be quantified as mild-to-moderate on today's exam, compatible with malignant ascites. 3. Cholelithiasis without imaging evidence of acute cholecystitis. 4. Trace left pleural effusion, slightly increased since the prior study.     Electronically Signed   By: Ree Molt M.D.   On: 06/03/2024 16:28  VAS US  UPPER EXTREMITY VENOUS DUPLEX UPPER VENOUS STUDY    Patient Name:  Marvin Carr  Date of Exam:   07/06/2024 Medical Rec #: [id number]        Accession #:    [id number] Date of Birth: [birth year]        Patient Gender: M Patient Age:   53 years Exam Location:  [facility] Procedure:      VAS US  UPPER EXTREMITY VENOUS DUPLEX Referring Phys: DUFFY AL-SULTANI  --------------------------------------------------------------------------------   Indications: Pain Risk Factors: Trauma. Limitations: Poor ultrasound/tissue interface. Comparison Study: No prior studies.  Performing Technologist: Cordella Collet RVT    Examination Guidelines: A complete evaluation includes B-mode imaging, spectral Doppler, color Doppler, and power Doppler as needed of all accessible  portions of each vessel. Bilateral testing is considered an integral part of a complete examination. Limited examinations for reoccurring indications may be performed as noted.    Right Findings: +----------+------------+---------+-----------+----------+-------+ RIGHT     CompressiblePhasicitySpontaneousPropertiesSummary +----------+------------+---------+-----------+----------+-------+ IJV           Full       Yes       Yes                      +----------+------------+---------+-----------+----------+-------+ Subclavian  Yes       Yes                      +----------+------------+---------+-----------+----------+-------+ Axillary      Full       Yes       Yes                      +----------+------------+---------+-----------+----------+-------+ Brachial      Full                                          +----------+------------+---------+-----------+----------+-------+ Radial        Full                                          +----------+------------+---------+-----------+----------+-------+ Ulnar         Full                                          +----------+------------+---------+-----------+----------+-------+ Cephalic      Full                                          +----------+------------+---------+-----------+----------+-------+ Basilic       Full                                          +----------+------------+---------+-----------+----------+-------+    Left Findings: +----------+------------+---------+-----------+----------+-------+ LEFT      CompressiblePhasicitySpontaneousPropertiesSummary +----------+------------+---------+-----------+----------+-------+ Subclavian               Yes       Yes                      +----------+------------+---------+-----------+----------+-------+    Summary:   Right: No evidence of deep vein thrombosis in the upper extremity. No  evidence of superficial vein thrombosis in the upper extremity.   Left: No evidence of thrombosis in the subclavian.    *See table(s) above for measurements and observations.   Diagnosing physician: Gaile New MD Electronically signed by Gaile New MD on 07/07/2024 at 10:25:45 AM.      Final

## 2024-07-15 NOTE — Progress Notes (Signed)
 Patient presents today for chemotherapy Folfox  infusion. Patient is in satisfactory condition with no new complaints voiced.  Vital signs are stable.  Labs reviewed by Delon Hope, NP  during the office visit and all labs are within treatment parameters. Patient will receive 2g IV magnesium  sulfate per provider's standing orders.  We will proceed with treatment per MD orders.   Treatment given today per MD orders. Tolerated infusion without adverse affects. Vital signs stable. No complaints at this time. Discharged from clinic ambulatory in stable condition. Alert and oriented x 3. F/U with [facility] as scheduled. 5FU ambulatory pump infusing with no alarms beeping.

## 2024-07-15 NOTE — Progress Notes (Signed)
 Spoke with Delon Hope, NP, ok to proceed with fluorouracil post cardiac issues.  Heart doctors do not feel as though the issue was from fluorouracil as the heart pain started prior to Folfox initiation.  Will monitor.  Niels Molt, PharmD

## 2024-07-16 ENCOUNTER — Encounter: Payer: Self-pay | Admitting: Oncology

## 2024-07-16 ENCOUNTER — Other Ambulatory Visit: Payer: Self-pay

## 2024-07-16 ENCOUNTER — Emergency Department (HOSPITAL_COMMUNITY)

## 2024-07-16 ENCOUNTER — Observation Stay (HOSPITAL_COMMUNITY)
Admission: EM | Admit: 2024-07-16 | Discharge: 2024-07-17 | Disposition: A | Discharge status: Home / Self Care | Attending: Internal Medicine | Admitting: Internal Medicine

## 2024-07-16 DIAGNOSIS — I2510 Atherosclerotic heart disease of native coronary artery without angina pectoris: Secondary | ICD-10-CM | POA: Diagnosis not present

## 2024-07-16 DIAGNOSIS — F10920 Alcohol use, unspecified with intoxication, uncomplicated: Secondary | ICD-10-CM | POA: Insufficient documentation

## 2024-07-16 DIAGNOSIS — Z794 Long term (current) use of insulin: Secondary | ICD-10-CM | POA: Insufficient documentation

## 2024-07-16 DIAGNOSIS — Z955 Presence of coronary angioplasty implant and graft: Secondary | ICD-10-CM

## 2024-07-16 DIAGNOSIS — R079 Chest pain, unspecified: Principal | ICD-10-CM | POA: Diagnosis present

## 2024-07-16 DIAGNOSIS — R0789 Other chest pain: Secondary | ICD-10-CM | POA: Diagnosis not present

## 2024-07-16 DIAGNOSIS — I10 Essential (primary) hypertension: Secondary | ICD-10-CM | POA: Insufficient documentation

## 2024-07-16 DIAGNOSIS — E119 Type 2 diabetes mellitus without complications: Secondary | ICD-10-CM | POA: Insufficient documentation

## 2024-07-16 DIAGNOSIS — Z79899 Other long term (current) drug therapy: Secondary | ICD-10-CM | POA: Insufficient documentation

## 2024-07-16 DIAGNOSIS — Z7982 Long term (current) use of aspirin: Secondary | ICD-10-CM | POA: Insufficient documentation

## 2024-07-16 DIAGNOSIS — Z959 Presence of cardiac and vascular implant and graft, unspecified: Secondary | ICD-10-CM | POA: Diagnosis not present

## 2024-07-16 LAB — CBC
HCT: 39.1 % (ref 39.0–52.0)
Hemoglobin: 12 g/dL — ABNORMAL LOW (ref 13.0–17.0)
MCH: 29.6 pg (ref 26.0–34.0)
MCHC: 30.7 g/dL (ref 30.0–36.0)
MCV: 96.3 fL (ref 80.0–100.0)
Platelets: 203 K/uL (ref 150–400)
RBC: 4.06 MIL/uL — ABNORMAL LOW (ref 4.22–5.81)
RDW: 18.3 % — ABNORMAL HIGH (ref 11.5–15.5)
WBC: 4.9 K/uL (ref 4.0–10.5)
nRBC: 0 % (ref 0.0–0.2)

## 2024-07-16 LAB — CANCER ANTIGEN 19-9: CA 19-9: 941 U/mL — ABNORMAL HIGH (ref 0–35)

## 2024-07-16 LAB — BASIC METABOLIC PANEL WITH GFR
Anion gap: 15 (ref 5–15)
BUN: 27 mg/dL — ABNORMAL HIGH (ref 8–23)
CO2: 21 mmol/L — ABNORMAL LOW (ref 22–32)
Calcium: 9.2 mg/dL (ref 8.9–10.3)
Chloride: 98 mmol/L (ref 98–111)
Creatinine, Ser: 1.12 mg/dL (ref 0.61–1.24)
GFR, Estimated: >60 mL/min (ref >60)
Glucose, Bld: 134 mg/dL — ABNORMAL HIGH (ref 70–99)
Potassium: 4.8 mmol/L (ref 3.5–5.1)
Sodium: 134 mmol/L — ABNORMAL LOW (ref 135–145)

## 2024-07-16 LAB — CEA: CEA: 25 ng/mL — ABNORMAL HIGH (ref 0.0–4.7)

## 2024-07-16 LAB — TROPONIN I (HIGH SENSITIVITY): Troponin I (High Sensitivity): 6 ng/L (ref <18)

## 2024-07-16 MED ORDER — NITROGLYCERIN 0.4 MG SL SUBL
0.4000 mg | SUBLINGUAL_TABLET | SUBLINGUAL | Status: DC | PRN
  Administered 2024-07-16 (×2): 0.4 mg via SUBLINGUAL
  Filled 2024-07-16 (×2): qty 1

## 2024-07-16 MED ORDER — ALUM & MAG HYDROXIDE-SIMETH 200-200-20 MG/5ML PO SUSP
30.0000 mL | Freq: Once | ORAL | Status: AC
  Administered 2024-07-16: 30 mL via ORAL
  Filled 2024-07-16: qty 30

## 2024-07-16 MED ORDER — FAMOTIDINE 20 MG PO TABS
20.0000 mg | ORAL_TABLET | Freq: Once | ORAL | Status: AC
  Administered 2024-07-16: 20 mg via ORAL
  Filled 2024-07-16: qty 1

## 2024-07-16 NOTE — ED Provider Notes (Signed)
 [place] EMERGENCY DEPARTMENT AT [facility] Provider Note   CSN: [id number] Arrival date & time: 07/16/24  1935     Patient presents with: Chest Pain   Marvin Carr is a 68 y.o. male presenting to ED with acute onset chest pain.  Patient reports he has epigastric pain that woke him up from sleep around 4 AM.  He says it is sharp pain in the middle of his chest.  He says he was diaphoretic with it for a few hours.  The pain is since at the little bit but is still present.  He says he feels similar to the pain he had immediately preceding his most recent hospitalization and cardiac catheterization.  The patient says he took 224 mg of aspirin  at home and is also on Brilinta.  He did not receive any nitroglycerin  with EMS or take it at home.  He did take Pepto-Bismol earlier thinking this might be reflux.  His pain is currently 6 out of 10 and does not radiate anywhere.  I reviewed the patient's most recent hospital discharge summary from approximately 1 week ago, October 27, and he was admitted for chest pain.  He is noted to have a history of type 2 diabetes, hypertension, cholangiocarcinoma with liver lesions, peritoneal carcinomatosis, malignant ascites, and during this hospitalization had a negative CT PE study without evidence of acute PE but noted a small left pleural effusion.  He was started on heparin  and underwent left heart catheterization where he was noted to have 80% stenosis of the distal circumflex and had a drug-eluting stent placed at the time, discharged on aspirin  and Brilinta.    He reportedly had very sharp pains preceding that procedure.  But he says he has not had chest pain since then - until today.  {Add pertinent medical, surgical, social history, OB history to HPI:32947} HPI     Prior to Admission medications   Medication Sig Start Date End Date Taking? Authorizing Provider  acetaminophen  (TYLENOL ) 500 MG tablet Take 2 tablets (1,000 mg total) by mouth  every 6 (six) hours. 01/16/24   Patti Rosina SAUNDERS, PA-C  amoxicillin (AMOXIL) 500 MG capsule SMARTSIG:4 Capsule(s) By Mouth As Directed 07/09/24   [provider]  aspirin  EC 81 MG tablet Take 1 tablet (81 mg total) by mouth daily. Swallow whole. 07/07/24   Al-Sultani, Anmar, MD  atorvastatin  (LIPITOR) 20 MG tablet Take 1 tablet (20 mg total) by mouth daily. 07/11/24 10/09/24  Strader, Laymon HERO, PA-C  benzonatate  (TESSALON ) 200 MG capsule Take 1 capsule (200 mg total) by mouth 3 (three) times daily as needed for cough. 07/14/24   Geofm Delon BRAVO, NP  bumetanide  (BUMEX ) 2 MG tablet Take 1 tablet (2 mg total) by mouth daily as needed. 05/21/24   Strader, Laymon HERO, PA-C  buPROPion  (WELLBUTRIN  XL) 300 MG 24 hr tablet Take 300 mg by mouth daily. 09/17/23   [provider]  cetirizine  (ZYRTEC ) 10 MG tablet Take 10 mg by mouth daily.    [provider]  cyclobenzaprine  (FLEXERIL ) 10 MG tablet Take 1 tablet (10 mg total) by mouth every 6 (six) hours as needed for muscle spasms. 07/15/24   Geofm Delon BRAVO, NP  dapagliflozin propanediol (FARXIGA) 10 MG TABS tablet Take 10 mg by mouth daily.    [provider]  dexamethasone  (DECADRON ) 4 MG tablet Take 2 tablets (8 mg total) by mouth daily. Start the day after chemotherapy for 2 days. Take with food. 07/01/24   Davonna,  Hyndavi, MD  docusate sodium (COLACE) 100 MG capsule Take 200 mg by mouth at bedtime.    [provider]  Durvalumab  (IMFINZI  IV) Inject into the vein every 21 ( twenty-one) days. 10/05/21   [provider]  Eszopiclone 3 MG TABS Take 3 mg by mouth at bedtime.    [provider]  famotidine  (PEPCID ) 20 MG tablet Take 20 mg by mouth at bedtime. 09/17/23   [provider]  fenofibrate  160 MG tablet Take 160 mg by mouth daily. 11/05/19   [provider]  guaiFENesin (MUCINEX) 600 MG 12 hr tablet Take 600 mg by mouth 2 (two) times daily as needed for cough or to loosen  phlegm.    [provider]  magic mouthwash (lidocaine , diphenhydrAMINE , alum & mag hydroxide) suspension Swish and swallow 5 mLs 3 (three) times daily as needed for mouth pain. 07/15/24   Geofm Delon BRAVO, NP  magnesium  oxide (MAG-OX) 400 (240 Mg) MG tablet Take 1 tablet (400 mg total) by mouth 2 (two) times daily. Patient taking differently: Take 200 mg by mouth daily. 11/16/21   Rogers Hai, MD  metFORMIN  (GLUCOPHAGE -XR) 500 MG 24 hr tablet Take 500 mg by mouth daily with breakfast. 08/16/21   [provider]  nitroGLYCERIN  (NITROSTAT ) 0.4 MG SL tablet Place 0.4 mg under the tongue every 5 (five) minutes x 3 doses as needed for chest pain (if no relief after 2nd dose, proceed to ED or call 911).    [provider]  olmesartan  (BENICAR ) 40 MG tablet TAKE ONE TABLET BY MOUTH EVERY DAY 01/27/24   Rogers Hai, MD  omeprazole (PRILOSEC) 40 MG capsule Take 40 mg by mouth daily as needed (heartburn, acid reflux). 01/25/24   [provider]  ondansetron  (ZOFRAN ) 8 MG tablet Take 1 tablet (8 mg total) by mouth every 8 (eight) hours as needed for nausea or vomiting. Start on the third day after chemotherapy. 07/01/24   Kandala, Hyndavi, MD  pramipexole  (MIRAPEX ) 0.75 MG tablet Take 1 tablet (0.75 mg total) by mouth 3 (three) times daily. 08/30/22   Rogers Hai, MD  prochlorperazine  (COMPAZINE ) 10 MG tablet Take 1 tablet (10 mg total) by mouth every 6 (six) hours as needed for nausea or vomiting. 07/01/24   Davonna Siad, MD  tamsulosin  (FLOMAX ) 0.4 MG CAPS capsule Take 0.4 mg by mouth daily. 08/29/21   [provider]  ticagrelor (BRILINTA) 90 MG TABS tablet Take 1 tablet (90 mg total) by mouth 2 (two) times daily. 07/07/24   Al-Sultani, Anmar, MD  traMADol  (ULTRAM ) 50 MG tablet Take 50 mg by mouth at bedtime as needed for severe pain (pain score 7-10).    [provider]  traZODone  (DESYREL ) 100 MG tablet Take 1 tablet (100 mg  total) by mouth at bedtime. 06/25/24   Kandala, Hyndavi, MD  triamcinolone (KENALOG) 0.1 % paste SMARTSIG:TO TEETH 07/11/24   [provider]    Allergies: Patient has no known allergies.    Review of Systems  Updated Vital Signs BP 116/80 (BP Location: Left Arm)   Pulse 81   Temp 98.3 F (36.8 C) (Oral)   Resp 20   Ht 6' (1.829 m)   Wt 117.9 kg   SpO2 99%   BMI 35.26 kg/m   Physical Exam Constitutional:      General: He is not in acute distress. HENT:     Head: Normocephalic and atraumatic.  Eyes:     Conjunctiva/sclera: Conjunctivae normal.  Pupils: Pupils are equal, round, and reactive to light.  Cardiovascular:     Rate and Rhythm: Normal rate and regular rhythm.  Pulmonary:     Effort: Pulmonary effort is normal. No respiratory distress.  Abdominal:     General: There is no distension.     Tenderness: There is no abdominal tenderness.  Skin:    General: Skin is warm and dry.  Neurological:     General: No focal deficit present.     Mental Status: He is alert. Mental status is at baseline.  Psychiatric:        Mood and Affect: Mood normal.        Behavior: Behavior normal.     (all labs ordered are listed, but only abnormal results are displayed) Labs Reviewed  BASIC METABOLIC PANEL WITH GFR - Abnormal; Notable for the following components:      Result Value   Sodium 134 (*)    CO2 21 (*)    Glucose, Bld 134 (*)    BUN 27 (*)    All other components within normal limits  CBC - Abnormal; Notable for the following components:   RBC 4.06 (*)    Hemoglobin 12.0 (*)    RDW 18.3 (*)    All other components within normal limits  TROPONIN I (HIGH SENSITIVITY)  TROPONIN I (HIGH SENSITIVITY)    EKG: None  Radiology: DG Chest 2 View Result Date: 07/16/2024 CLINICAL DATA:  Chest pain. EXAM: CHEST - 2 VIEW COMPARISON:  July 03, 2024 FINDINGS: There is stable right-sided venous Port-A-Cath positioning. The heart size and mediastinal contours  are within normal limits. Mild atelectasis and/or infiltrate is seen within the left lung base. This is decreased in severity when compared to the prior study. There is a small, stable left pleural effusion. No pneumothorax is identified. The visualized skeletal structures are unremarkable. IMPRESSION: 1. Mild left basilar atelectasis and/or infiltrate, decreased in severity when compared to the prior study. 2. Small, stable left pleural effusion. Electronically Signed   By: Suzen Dials M.D.   On: 07/16/2024 20:43    {Document cardiac monitor, telemetry assessment procedure when appropriate:32947} Procedures   Medications Ordered in the ED  nitroGLYCERIN  (NITROSTAT ) SL tablet 0.4 mg (0.4 mg Sublingual Given 07/16/24 2147)  alum & mag hydroxide-simeth (MAALOX/MYLANTA) 200-200-20 MG/5ML suspension 30 mL (has no administration in time range)  famotidine  (PEPCID ) tablet 20 mg (has no administration in time range)      {Click here for ABCD2, HEART and other calculators REFRESH Note before signing:1}                              Medical Decision Making Amount and/or Complexity of Data Reviewed Labs: ordered. Radiology: ordered.  Risk OTC drugs. Prescription drug management.   This patient presents to the Emergency Department with complaint of chest pain. This involves an extensive number of treatment options, and is a complaint that carries with it a high risk of complications and morbidity, given the patient's comorbidity, including known coronary disease, diabetes, high blood pressure.The differential diagnosis includes ACS vs Pneumothorax vs Reflux/Gastritis vs MSK pain vs Pneumonia vs other.  I felt PE was less likely given that patient has no acute PE risk factors, is compliant on his Brilinta, no tachycardia or hypoxia, and had a negative CT PE study during last hospitalization for similar symptoms  I ordered, reviewed, and interpreted labs.  Pertinent results include *** I  ordered  medication subungual nitroglycerin  for chest pain; GI medications I ordered imaging studies which included dg chest  I independently visualized and interpreted imaging which showed stable small left-sided infiltrate, decreased inside, no emergent findings and the monitor tracing which showed sinus rhythm. I agree with the radiologist interpretation Additional history was obtained from EMS External records obtained and reviewed showing most recent hospital discharge summary I personally reviewed the patients ECG which showed sinus rhythm with no acute ischemic findings  After the interventions stated above, I reevaluated the patient and found that they were ***  Based on the patient's clinical exam, vital signs, risk factors, and ED testing, I felt that the patient's overall risk of life-threatening emergency such as ACS, PE, sepsis, or infection was low.  At this time, I felt the patient's presentation was most clinically consistent with ***, but explained to the patient that this evaluation was not a definitive diagnostic workup.  I discussed outpatient follow up with primary care provider, and provided specialist office number on the patient's discharge paper if a referral was deemed necessary.  Return precautions were discussed with the patient.  I felt the patient was clinically stable for discharge.   {Document critical care time when appropriate  Document review of labs and clinical decision tools ie CHADS2VASC2, etc  Document your independent review of radiology images and any outside records  Document your discussion with family members, caretakers and with consultants  Document social determinants of health affecting pt's care  Document your decision making why or why not admission, treatments were needed:32947:::1}   Final diagnoses:  None    ED Discharge Orders     None

## 2024-07-16 NOTE — Consult Note (Signed)
 Cardiology Consultation   Patient ID: Marvin Carr MRN: [id number]; DOB: 1956-05-23  Admit date: 07/16/2024 Date of Consult: 07/16/2024  PCP:  [name], MD   [facility] Providers Cardiologist:  Jayson Sierras, MD  Cardiology APP:  Johnson Laymon HERO, PA-C  { Click here to update MD or APP on Care Team, Refresh:1}     Patient Profile: Marvin Carr is a 68 y.o. male with a hx of *** who is being seen 07/16/2024 for the evaluation of *** at the request of ***.  History of Present Illness: Mr. Douse ***  Nitroglycerin  helped for his pain  Troponin 6 -> ***   Past Medical History:  Diagnosis Date   Anxiety    Arthritis    Bladder cancer (HCC) 09/11/2009   CAD (coronary artery disease), native coronary artery    a. Mildly elevated troponin 03/2013, cath with nonobstructive disease including 50% AV groove distal stenosis before large OM   Depression    Essential hypertension    Headache(784.0)    History of migraines   Hyperglycemia    Metastatic cancer to liver [facility])    Mixed hyperlipidemia    Neuropathy    Obesity    Port-A-Cath in place 09/30/2021   Pre-diabetes    Seasonal allergies    Sleep apnea    On CPAP    Past Surgical History:  Procedure Laterality Date   BIOPSY  09/23/2021   Procedure: BIOPSY;  Surgeon: Eartha Angelia Sieving, MD;  Location: [facility];  Service: Gastroenterology;;   COLONOSCOPY  06/28/2011   Procedure: COLONOSCOPY;  Surgeon: Claudis RAYMOND Rivet, MD;  Location: [facility];  Service: Endoscopy;  Laterality: N/A;   COLONOSCOPY WITH PROPOFOL  N/A 09/23/2021   Procedure: COLONOSCOPY WITH PROPOFOL ;  Surgeon: Eartha Angelia Sieving, MD;  Location: [facility];  Service: Gastroenterology;  Laterality: N/A;  940   CORONARY STENT INTERVENTION N/A 07/04/2024   Procedure: CORONARY STENT INTERVENTION;  Surgeon: Wendel Lurena POUR, MD;  Location: [facility] INVASIVE CV LAB;  Service: Cardiovascular;  Laterality: N/A;    ESOPHAGOGASTRODUODENOSCOPY (EGD) WITH PROPOFOL  N/A 09/23/2021   Procedure: ESOPHAGOGASTRODUODENOSCOPY (EGD) WITH PROPOFOL ;  Surgeon: Eartha Angelia Sieving, MD;  Location: [facility];  Service: Gastroenterology;  Laterality: N/A;   IR IMAGING GUIDED PORT INSERTION  09/28/2021   IR PARACENTESIS  09/28/2021   JOINT REPLACEMENT Right    hip   LEFT HEART CATH AND CORONARY ANGIOGRAPHY N/A 07/04/2024   Procedure: LEFT HEART CATH AND CORONARY ANGIOGRAPHY;  Surgeon: Wendel Lurena POUR, MD;  Location: [facility] INVASIVE CV LAB;  Service: Cardiovascular;  Laterality: N/A;   LEFT HEART CATHETERIZATION WITH CORONARY ANGIOGRAM N/A 03/31/2013   Procedure: LEFT HEART CATHETERIZATION WITH CORONARY ANGIOGRAM;  Surgeon: Lynwood Schilling, MD;  Location: [facility] CATH LAB;  Service: Cardiovascular;  Laterality: N/A;   POLYPECTOMY  09/23/2021   Procedure: POLYPECTOMY;  Surgeon: Eartha Angelia Sieving, MD;  Location: [facility];  Service: Gastroenterology;;   TOTAL HIP ARTHROPLASTY Left 01/15/2024   Procedure: ARTHROPLASTY, HIP, TOTAL, ANTERIOR APPROACH;  Surgeon: Ernie Cough, MD;  Location: [facility] ORS;  Service: Orthopedics;  Laterality: Left;   TURBT  09/11/2009     {Home Medications (Optional):21181}  Scheduled Meds:  Continuous Infusions:  PRN Meds: nitroGLYCERIN   Allergies:   No Known Allergies  Social History:   Social History   Socioeconomic History   Marital status: Divorced    Spouse name: Not on file   Number of children: 0   Years of education: college  Highest education level: Not on file  Occupational History    Employer: SELF-EMPLOYED  Tobacco Use   Smoking status: Former    Current packs/day: 0.00    Average packs/day: 0.5 packs/day for 10.0 years (5.0 ttl pk-yrs)    Types: Cigarettes    Start date: 07/09/2001    Quit date: 07/10/2011    Years since quitting: 13.0    Passive exposure: Past   Smokeless tobacco: Never   Tobacco comments:    Quit several yrs prior to 03/2013.  Vaping Use    Vaping status: Never Used  Substance and Sexual Activity   Alcohol use: Yes    Alcohol/week: 0.0 standard drinks of alcohol    Comment: Occasional   Drug use: No   Sexual activity: Not on file  Other Topics Concern   Not on file  Social History Narrative   Not on file   Social Drivers of Health   Financial Resource Strain: Not on file  Food Insecurity: No Food Insecurity (07/09/2024)   Hunger Vital Sign    Worried About Running Out of Food in the Last Year: Never true    Ran Out of Food in the Last Year: Never true  Transportation Needs: No Transportation Needs (07/09/2024)   PRAPARE - Administrator, Civil Service (Medical): No    Lack of Transportation (Non-Medical): No  Physical Activity: Not on file  Stress: Not on file  Social Connections: Moderately Isolated (07/03/2024)   Social Connection and Isolation Panel    Frequency of Communication with Friends and Family: More than three times a week    Frequency of Social Gatherings with Friends and Family: More than three times a week    Attends Religious Services: More than 4 times per year    Active Member of [employer] or Organizations: No    Attends Banker Meetings: Never    Marital Status: Divorced  Catering Manager Violence: Not At Risk (07/09/2024)   Humiliation, Afraid, Rape, and Kick questionnaire    Fear of Current or Ex-Partner: No    Emotionally Abused: No    Physically Abused: No    Sexually Abused: No    Family History:   *** Family History  Problem Relation Age of Onset   COPD Father      ROS:  Please see the history of present illness.  *** All other ROS reviewed and negative.     Physical Exam/Data: Vitals:   07/16/24 1944 07/16/24 1959 07/16/24 2300  BP: 116/80  (!) 140/79  Pulse: 81  73  Resp: 20    Temp: 98.3 F (36.8 C)    TempSrc: Oral    SpO2: 99%  96%  Weight:  117.9 kg   Height:  6' (1.829 m)    No intake or output data in the 24 hours ending 07/16/24  2320    07/16/2024    7:59 PM 07/15/2024    8:40 AM 07/11/2024    1:04 PM  Last 3 Weights  Weight (lbs) 260 lb 268 lb 1.3 oz 270 lb  Weight (kg) 117.935 kg 121.6 kg 122.471 kg     Body mass index is 35.26 kg/m.  General:  Well nourished, well developed, in no acute distress*** HEENT: normal Neck: no JVD Vascular: No carotid bruits; Distal pulses 2+ bilaterally Cardiac:  normal S1, S2; RRR; no murmur *** Lungs:  clear to auscultation bilaterally, no wheezing, rhonchi or rales  Abd: soft, nontender, no hepatomegaly  Ext: no edema  Musculoskeletal:  No deformities, BUE and BLE strength normal and equal Skin: warm and dry  Neuro:  CNs 2-12 intact, no focal abnormalities noted Psych:  Normal affect   EKG:  The EKG was personally reviewed and demonstrates:  *** Telemetry:  Telemetry was personally reviewed and demonstrates:  ***  Relevant CV Studies: LHC 07/04/24:   Mid Cx to Dist Cx lesion is 80% stenosed.   2nd Diag lesion is 95% stenosed.   A stent was successfully placed.   Post intervention, there is a 0% residual stenosis.   1.  High-grade distal left circumflex lesion treated with one 3.5 x 16 mm Synergy XD stent postdilated to 3.75 mm. 2.  LVEDP of 20 mmHg.  TTE 07/03/24: 1. Left ventricular ejection fraction, by estimation, is 60 to 65%. The  left ventricle has normal function. The left ventricle has no regional  wall motion abnormalities. Left ventricular diastolic parameters are  consistent with Grade I diastolic  dysfunction (impaired relaxation).   2. Right ventricular systolic function is normal. The right ventricular  size is normal.   3. The mitral valve is normal in structure. No evidence of mitral valve  regurgitation. No evidence of mitral stenosis.   4. The aortic valve was not well visualized. Aortic valve regurgitation  is not visualized. No aortic stenosis is present.   5. Aortic dilatation noted. There is mild dilatation of the aortic root,  measuring  41 mm.   6. The inferior vena cava is normal in size with greater than 50%  respiratory variability, suggesting right atrial pressure of 3 mmHg.   Laboratory Data: High Sensitivity Troponin:   Recent Labs  Lab 07/16/24 2017  TROPONINIHS 6     Chemistry Recent Labs  Lab 07/15/24 0839 07/16/24 2017  NA 137 134*  K 3.9 4.8  CL 100 98  CO2 28 21*  GLUCOSE 126* 134*  BUN 19 27*  CREATININE 0.91 1.12  CALCIUM  8.8* 9.2  MG 1.5*  --   GFRNONAA >60 >60  ANIONGAP 10 15    Recent Labs  Lab 07/15/24 0839  PROT 6.1*  ALBUMIN  3.4*  AST 18  ALT 10  ALKPHOS 99  BILITOT 0.3   Lipids No results for input(s): CHOL, TRIG, HDL, LABVLDL, LDLCALC, CHOLHDL in the last 168 hours.  Hematology Recent Labs  Lab 07/15/24 0839 07/16/24 2017  WBC 5.9 4.9  RBC 3.43* 4.06*  HGB 10.2* 12.0*  HCT 32.9* 39.1  MCV 95.9 96.3  MCH 29.7 29.6  MCHC 31.0 30.7  RDW 17.6* 18.3*  PLT 183 203   Thyroid  No results for input(s): TSH, FREET4 in the last 168 hours.  BNPNo results for input(s): BNP, PROBNP in the last 168 hours.  DDimer No results for input(s): DDIMER in the last 168 hours.  Radiology/Studies:  DG Chest 2 View Result Date: 07/16/2024 CLINICAL DATA:  Chest pain. EXAM: CHEST - 2 VIEW COMPARISON:  July 03, 2024 FINDINGS: There is stable right-sided venous Port-A-Cath positioning. The heart size and mediastinal contours are within normal limits. Mild atelectasis and/or infiltrate is seen within the left lung base. This is decreased in severity when compared to the prior study. There is a small, stable left pleural effusion. No pneumothorax is identified. The visualized skeletal structures are unremarkable. IMPRESSION: 1. Mild left basilar atelectasis and/or infiltrate, decreased in severity when compared to the prior study. 2. Small, stable left pleural effusion. Electronically Signed   By: Suzen Dials M.D.   On: 07/16/2024 20:43  Assessment and  Plan: ***   Risk Assessment/Risk Scores: {Complete the following score calculators/questions to meet required metrics.  Press F2         :[id number]}   {Is the patient being seen for unstable angina, ACS, NSTEMI or STEMI?:[phone]} {Does this patient have CHF or CHF symptoms?      :[id number]} {Does this patient have ATRIAL FIBRILLATION?:[id number]}  {Are we signing off today?:[id number]}  For questions or updates, please contact [facility] Please consult [email or web address] for contact info under    {TIP  Split Shared Billing  Do NOT delete any part of this including brackets If split shared billing is based upon MDM, disregard If billing will be based upon TIME you MUST document the number of minutes and a detailed list of what was done in that time in the following format Example - I spent ** minutes seeing this patient. During that time I reviewed their history, evaluated their symptoms, reviewed available labs, EKGs, studies, performed an exam and formulated an assessment and plan   :1} {Select this only if you need to document critical care time (Optional):[phone]} Signed, Curtistine LITTIE Farr, MD  07/16/2024 11:20 PM

## 2024-07-16 NOTE — ED Triage Notes (Signed)
 Pt bib [facility] from home where he started having chest pain at 0400 today. Pt states that he woke up got something to eat and took nausea medication and Pepto bismol around 1800. Pain is centralized that got better after taking home medications. Pt also took 324mg  ASA prior to EMS arrival. Pt arrives with slight chest discomfort. Pt had a cardiac stent placed on Saturday and chemotherapy yesterday without difficulty.

## 2024-07-16 NOTE — ED Notes (Signed)
 Patient transported to X-ray

## 2024-07-16 NOTE — ED Provider Notes (Incomplete)
 [facility] Provider Note   CSN: [id number] Arrival date & time: 07/16/24  1935     Patient presents with: Chest Pain   Marvin Carr is a 68 y.o. male presenting to ED with acute onset chest pain.  Patient reports he has epigastric pain that woke him up from sleep around 4 AM.  He says it is sharp pain in the middle of his chest.  He says he was diaphoretic with it for a few hours.  The pain is since at the little bit but is still present.  He says he feels similar to the pain he had immediately preceding his most recent hospitalization and cardiac catheterization.  The patient says he took 224 mg of aspirin  at home and is also on Brilinta.  He did not receive any nitroglycerin  with EMS or take it at home.  He did take Pepto-Bismol earlier thinking this might be reflux.  His pain is currently 6 out of 10 and does not radiate anywhere.  I reviewed the patient's most recent hospital discharge summary from approximately 1 week ago, October 27, and he was admitted for chest pain.  He is noted to have a history of type 2 diabetes, hypertension, cholangiocarcinoma with liver lesions, peritoneal carcinomatosis, malignant ascites, and during this hospitalization had a negative CT PE study without evidence of acute PE but noted a small left pleural effusion.  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    He reportedly had very sharp pains preceding that procedure.  But he says he has not had chest pain since then - until today.  {Add pertinent medical, surgical, social history, OB history to HPI:32947} HPI     Prior to Admission medications   Medication Sig Start Date End Date Taking? Authorizing Provider  acetaminophen  (TYLENOL ) 500 MG tablet Take 2  tablets (1,000 mg total) by mouth every 6 (six) hours. 01/16/24   Patti Rosina SAUNDERS, PA-C  amoxicillin (AMOXIL) 500 MG capsule SMARTSIG:4 Capsule(s) By Mouth As Directed 07/09/24   [provider]  aspirin  EC 81 MG tablet Take 1 tablet (81 mg total) by mouth daily. Swallow whole. 07/07/24   Al-Sultani, Anmar, MD  atorvastatin  (LIPITOR) 20 MG tablet Take 1 tablet (20 mg total) by mouth daily. 07/11/24 10/09/24  Strader, Laymon HERO, PA-C  benzonatate  (TESSALON ) 200 MG capsule Take 1 capsule (200 mg total) by mouth 3 (three) times daily as needed for cough. 07/14/24   Geofm Delon BRAVO, NP  bumetanide  (BUMEX ) 2 MG tablet Take 1 tablet (2 mg total) by mouth daily as needed. 05/21/24   Strader, Laymon HERO, PA-C  buPROPion  (WELLBUTRIN  XL) 300 MG 24 hr tablet Take 300 mg by mouth daily. 09/17/23   [provider]  cetirizine  (ZYRTEC ) 10 MG tablet Take 10 mg by mouth daily.    [provider]  cyclobenzaprine  (FLEXERIL ) 10 MG tablet Take 1 tablet (10 mg total) by mouth every 6 (six) hours as needed for muscle spasms. 07/15/24   Geofm Delon BRAVO, NP  dapagliflozin propanediol (FARXIGA) 10 MG TABS tablet Take 10 mg by mouth daily.    [provider]  dexamethasone  (DECADRON ) 4 MG tablet Take 2 tablets (8 mg total) by mouth daily. Start the day after chemotherapy for 2 days.  Take with food. 07/01/24   Davonna Siad, MD  docusate sodium (COLACE) 100 MG capsule Take 200 mg by mouth at bedtime.    [provider]  Durvalumab  (IMFINZI  IV) Inject into the vein every 21 ( twenty-one) days. 10/05/21   [provider]  Eszopiclone 3 MG TABS Take 3 mg by mouth at bedtime.    [provider]  famotidine  (PEPCID ) 20 MG tablet Take 20 mg by mouth at bedtime. 09/17/23   [provider]  fenofibrate  160 MG tablet Take 160 mg by mouth daily. 11/05/19   [provider]  guaiFENesin (MUCINEX) 600 MG 12 hr tablet Take 600 mg by mouth 2 (two) times daily as  needed for cough or to loosen phlegm.    [provider]  magic mouthwash (lidocaine , diphenhydrAMINE , alum & mag hydroxide) suspension Swish and swallow 5 mLs 3 (three) times daily as needed for mouth pain. 07/15/24   Geofm Delon BRAVO, NP  magnesium  oxide (MAG-OX) 400 (240 Mg) MG tablet Take 1 tablet (400 mg total) by mouth 2 (two) times daily. Patient taking differently: Take 200 mg by mouth daily. 11/16/21   Rogers Hai, MD  metFORMIN  (GLUCOPHAGE -XR) 500 MG 24 hr tablet Take 500 mg by mouth daily with breakfast. 08/16/21   [provider]  nitroGLYCERIN  (NITROSTAT ) 0.4 MG SL tablet Place 0.4 mg under the tongue every 5 (five) minutes x 3 doses as needed for chest pain (if no relief after 2nd dose, proceed to ED or call 911).    [provider]  olmesartan  (BENICAR ) 40 MG tablet TAKE ONE TABLET BY MOUTH EVERY DAY 01/27/24   Rogers Hai, MD  omeprazole (PRILOSEC) 40 MG capsule Take 40 mg by mouth daily as needed (heartburn, acid reflux). 01/25/24   [provider]  ondansetron  (ZOFRAN ) 8 MG tablet Take 1 tablet (8 mg total) by mouth every 8 (eight) hours as needed for nausea or vomiting. Start on the third day after chemotherapy. 07/01/24   Kandala, Hyndavi, MD  pramipexole  (MIRAPEX ) 0.75 MG tablet Take 1 tablet (0.75 mg total) by mouth 3 (three) times daily. 08/30/22   Rogers Hai, MD  prochlorperazine  (COMPAZINE ) 10 MG tablet Take 1 tablet (10 mg total) by mouth every 6 (six) hours as needed for nausea or vomiting. 07/01/24   Kandala, Hyndavi, MD  tamsulosin  (FLOMAX ) 0.4 MG CAPS capsule Take 0.4 mg by mouth daily. 08/29/21   [provider]  ticagrelor (BRILINTA) 90 MG TABS tablet Take 1 tablet (90 mg total) by mouth 2 (two) times daily. 07/07/24   Al-Sultani, Anmar, MD  traMADol  (ULTRAM ) 50 MG tablet Take 50 mg by mouth at bedtime as needed for severe pain (pain score 7-10).    [provider]  traZODone  (DESYREL ) 100 MG  tablet Take 1 tablet (100 mg total) by mouth at bedtime. 06/25/24   Kandala, Hyndavi, MD  triamcinolone (KENALOG) 0.1 % paste SMARTSIG:TO TEETH 07/11/24   [provider]    Allergies: Patient has no known allergies.    Review of Systems  Updated Vital Signs BP 116/73   Pulse 80   Temp 98.3 F (36.8 C) (Oral)   Resp 18   Ht 6' (1.829 m)   Wt 117.9 kg   SpO2 95%   BMI 35.26 kg/m   Physical Exam Constitutional:      General: He is not in acute distress. HENT:     Head: Normocephalic and atraumatic.  Eyes:     Conjunctiva/sclera: Conjunctivae normal.  Pupils: Pupils are equal, round, and reactive to light.  Cardiovascular:     Rate and Rhythm: Normal rate and regular rhythm.  Pulmonary:     Effort: Pulmonary effort is normal. No respiratory distress.  Abdominal:     General: There is no distension.     Tenderness: There is no abdominal tenderness.  Skin:    General: Skin is warm and dry.  Neurological:     General: No focal deficit present.     Mental Status: He is alert. Mental status is at baseline.  Psychiatric:        Mood and Affect: Mood normal.        Behavior: Behavior normal.     (all labs ordered are listed, but only abnormal results are displayed) Labs Reviewed  BASIC METABOLIC PANEL WITH GFR - Abnormal; Notable for the following components:      Result Value   Sodium 134 (*)    CO2 21 (*)    Glucose, Bld 134 (*)    BUN 27 (*)    All other components within normal limits  CBC - Abnormal; Notable for the following components:   RBC 4.06 (*)    Hemoglobin 12.0 (*)    RDW 18.3 (*)    All other components within normal limits  TROPONIN I (HIGH SENSITIVITY)  TROPONIN I (HIGH SENSITIVITY)    EKG: None  Radiology: DG Chest 2 View Result Date: 07/16/2024 CLINICAL DATA:  Chest pain. EXAM: CHEST - 2 VIEW COMPARISON:  July 03, 2024 FINDINGS: There is stable right-sided venous Port-A-Cath positioning. The heart size and mediastinal  contours are within normal limits. Mild atelectasis and/or infiltrate is seen within the left lung base. This is decreased in severity when compared to the prior study. There is a small, stable left pleural effusion. No pneumothorax is identified. The visualized skeletal structures are unremarkable. IMPRESSION: 1. Mild left basilar atelectasis and/or infiltrate, decreased in severity when compared to the prior study. 2. Small, stable left pleural effusion. Electronically Signed   By: Suzen Dials M.D.   On: 07/16/2024 20:43    {Document cardiac monitor, telemetry assessment procedure when appropriate:32947} Procedures   Medications Ordered in the ED  nitroGLYCERIN  (NITROSTAT ) SL tablet 0.4 mg (0.4 mg Sublingual Given 07/16/24 2300)  alum & mag hydroxide-simeth (MAALOX/MYLANTA) 200-200-20 MG/5ML suspension 30 mL (30 mLs Oral Given 07/16/24 2231)  famotidine  (PEPCID ) tablet 20 mg (20 mg Oral Given 07/16/24 2231)    Clinical Course as of 07/16/24 2355  Wed Jul 16, 2024  2258 Pt felt nitroglycerin  improved chest pain small amount but GI cocktail afterwards did not.  We'll give additional nitroglycerin  now - I've paged cardiology to discuss this case [MT]  2326 Dr Melia cardiology to see patient.  Dispo per cardiologist recommendation.  Pt continues to have moderate chest discomfort, but appears comfortable otherwise, vitals stable. [MT]    Clinical Course User Index [MT] Charline Hoskinson, Donnice PARAS, MD   {Click here for ABCD2, HEART and other calculators REFRESH Note before signing:1}                              Medical Decision Making Amount and/or Complexity of Data Reviewed Labs: ordered. Radiology: ordered.  Risk OTC drugs. Prescription drug management.   This patient presents to the Emergency Department with complaint of chest pain. This involves an extensive number of treatment options, and is a complaint that carries with it a high risk  of complications and morbidity, given the patient's  comorbidity, including known coronary disease, diabetes, high blood pressure.The differential diagnosis includes ACS vs Pneumothorax vs Reflux/Gastritis vs MSK pain vs Pneumonia vs other.  I felt PE was less likely given that patient has no acute PE risk factors, is compliant on his Brilinta, no tachycardia or hypoxia, and had a negative CT PE study during last hospitalization for similar symptoms  I ordered, reviewed, and interpreted labs.  Pertinent results include *** I ordered medication subungual nitroglycerin  for chest pain; GI medications I ordered imaging studies which included dg chest  I independently visualized and interpreted imaging which showed stable small left-sided infiltrate, decreased inside, no emergent findings and the monitor tracing which showed sinus rhythm. I agree with the radiologist interpretation Additional history was obtained from EMS External records obtained and reviewed showing most recent hospital discharge summary I personally reviewed the patients ECG which showed sinus rhythm with no acute ischemic findings  After the interventions stated above, I reevaluated the patient and found that they were mildly improved  Cardiology consulted and pending recommendations at the time of sign-out to overnight provider Dr. Griselda.  {Document critical care time when appropriate  Document review of labs and clinical decision tools ie CHADS2VASC2, etc  Document your independent review of radiology images and any outside records  Document your discussion with family members, caretakers and with consultants  Document social determinants of health affecting pt's care  Document your decision making why or why not admission, treatments were needed:32947:::1}   Final diagnoses:  Chest pain, unspecified type    ED Discharge Orders     None

## 2024-07-16 NOTE — ED Notes (Signed)
 Patient returned from X-ray

## 2024-07-16 NOTE — ED Notes (Signed)
 Pt arrives with port already accessed, but states that he would like an IV instead of having the port used.

## 2024-07-16 NOTE — ED Notes (Signed)
 Large bruising noted to right arm from procedure done previously

## 2024-07-17 ENCOUNTER — Inpatient Hospital Stay

## 2024-07-17 ENCOUNTER — Other Ambulatory Visit (HOSPITAL_COMMUNITY): Payer: Self-pay

## 2024-07-17 DIAGNOSIS — R079 Chest pain, unspecified: Secondary | ICD-10-CM

## 2024-07-17 DIAGNOSIS — R0789 Other chest pain: Secondary | ICD-10-CM | POA: Diagnosis not present

## 2024-07-17 DIAGNOSIS — Z955 Presence of coronary angioplasty implant and graft: Secondary | ICD-10-CM

## 2024-07-17 DIAGNOSIS — I2510 Atherosclerotic heart disease of native coronary artery without angina pectoris: Secondary | ICD-10-CM | POA: Diagnosis not present

## 2024-07-17 LAB — CBC
HCT: 36.2 % — ABNORMAL LOW (ref 39.0–52.0)
Hemoglobin: 11.1 g/dL — ABNORMAL LOW (ref 13.0–17.0)
MCH: 29.3 pg (ref 26.0–34.0)
MCHC: 30.7 g/dL (ref 30.0–36.0)
MCV: 95.5 fL (ref 80.0–100.0)
Platelets: 178 K/uL (ref 150–400)
RBC: 3.79 MIL/uL — ABNORMAL LOW (ref 4.22–5.81)
RDW: 18.4 % — ABNORMAL HIGH (ref 11.5–15.5)
WBC: 3.4 K/uL — ABNORMAL LOW (ref 4.0–10.5)
nRBC: 0 % (ref 0.0–0.2)

## 2024-07-17 LAB — TROPONIN I (HIGH SENSITIVITY): Troponin I (High Sensitivity): 8 ng/L (ref <18)

## 2024-07-17 LAB — HEPATIC FUNCTION PANEL
ALT: 13 U/L (ref 0–44)
AST: 21 U/L (ref 15–41)
Albumin: 2.9 g/dL — ABNORMAL LOW (ref 3.5–5.0)
Alkaline Phosphatase: 67 U/L (ref 38–126)
Bilirubin, Direct: 0.2 mg/dL (ref 0.0–0.2)
Indirect Bilirubin: 0.6 mg/dL (ref 0.3–0.9)
Total Bilirubin: 0.8 mg/dL (ref 0.0–1.2)
Total Protein: 6.2 g/dL — ABNORMAL LOW (ref 6.5–8.1)

## 2024-07-17 LAB — CBG MONITORING, ED
Glucose-Capillary: 93 mg/dL (ref 70–99)
Glucose-Capillary: 85 mg/dL (ref 70–99)

## 2024-07-17 LAB — CREATININE, SERUM
Creatinine, Ser: 1.13 mg/dL (ref 0.61–1.24)
GFR, Estimated: >60 mL/min (ref >60)

## 2024-07-17 LAB — LIPASE, BLOOD: Lipase: 23 U/L (ref 11–51)

## 2024-07-17 MED ORDER — TICAGRELOR 90 MG PO TABS
90.0000 mg | ORAL_TABLET | Freq: Two times a day (BID) | ORAL | Status: DC
  Administered 2024-07-17 (×2): 90 mg via ORAL
  Filled 2024-07-17 (×2): qty 1

## 2024-07-17 MED ORDER — ACETAMINOPHEN 650 MG RE SUPP: 650.0000 mg | Freq: Four times a day (QID) | RECTAL | Status: DC | PRN

## 2024-07-17 MED ORDER — PANTOPRAZOLE SODIUM 40 MG IV SOLR
40.0000 mg | Freq: Two times a day (BID) | INTRAVENOUS | Status: DC
  Administered 2024-07-17: 40 mg via INTRAVENOUS
  Filled 2024-07-17: qty 10

## 2024-07-17 MED ORDER — ASPIRIN 81 MG PO TBEC
81.0000 mg | DELAYED_RELEASE_TABLET | Freq: Every day | ORAL | Status: DC
  Administered 2024-07-17: 81 mg via ORAL
  Filled 2024-07-17: qty 1

## 2024-07-17 MED ORDER — ACETAMINOPHEN 500 MG PO TABS
1000.0000 mg | ORAL_TABLET | Freq: Four times a day (QID) | ORAL | Status: DC
  Administered 2024-07-17 (×2): 1000 mg via ORAL
  Filled 2024-07-17 (×2): qty 2

## 2024-07-17 MED ORDER — INSULIN ASPART 100 UNIT/ML IJ SOLN: 0.0000 [IU] | Freq: Every day | INTRAMUSCULAR | Status: DC

## 2024-07-17 MED ORDER — TRAMADOL HCL 50 MG PO TABS: 50.0000 mg | ORAL_TABLET | Freq: Every evening | ORAL | Status: DC | PRN

## 2024-07-17 MED ORDER — DAPAGLIFLOZIN PROPANEDIOL 10 MG PO TABS
10.0000 mg | ORAL_TABLET | Freq: Every day | ORAL | Status: DC
  Administered 2024-07-17: 10 mg via ORAL
  Filled 2024-07-17: qty 1

## 2024-07-17 MED ORDER — ACETAMINOPHEN 325 MG PO TABS: 650.0000 mg | ORAL_TABLET | Freq: Four times a day (QID) | ORAL | Status: DC | PRN

## 2024-07-17 MED ORDER — LIDOCAINE 5 % EX PTCH
1.0000 | MEDICATED_PATCH | CUTANEOUS | Status: DC
  Administered 2024-07-17: 1 via TRANSDERMAL
  Filled 2024-07-17: qty 1

## 2024-07-17 MED ORDER — HEPARIN SOD (PORK) LOCK FLUSH 100 UNIT/ML IV SOLN
500.0000 [IU] | INTRAVENOUS | Status: AC | PRN
  Administered 2024-07-17: 500 [IU]

## 2024-07-17 MED ORDER — LIDOCAINE 5 % EX PTCH: 1.0000 | MEDICATED_PATCH | CUTANEOUS | 0 refills | Status: DC

## 2024-07-17 MED ORDER — ONDANSETRON HCL 4 MG PO TABS: 4.0000 mg | ORAL_TABLET | Freq: Four times a day (QID) | ORAL | Status: DC | PRN

## 2024-07-17 MED ORDER — INSULIN ASPART 100 UNIT/ML IJ SOLN
0.0000 [IU] | Freq: Three times a day (TID) | INTRAMUSCULAR | Status: DC
  Filled 2024-07-17: qty 2

## 2024-07-17 MED ORDER — ENOXAPARIN SODIUM 60 MG/0.6ML IJ SOSY: 55.0000 mg | PREFILLED_SYRINGE | INTRAMUSCULAR | Status: DC

## 2024-07-17 MED ORDER — PANTOPRAZOLE SODIUM 40 MG IV SOLR: 40.0000 mg | INTRAVENOUS | Status: DC

## 2024-07-17 MED ORDER — PANTOPRAZOLE SODIUM 40 MG PO TBEC
40.0000 mg | DELAYED_RELEASE_TABLET | Freq: Every day | ORAL | 0 refills | Status: AC
  Filled 2024-07-17: qty 30, 30d supply, fill #0

## 2024-07-17 MED ORDER — OXYCODONE-ACETAMINOPHEN 5-325 MG PO TABS
1.0000 | ORAL_TABLET | Freq: Once | ORAL | Status: AC
  Administered 2024-07-17: 1 via ORAL
  Filled 2024-07-17: qty 1

## 2024-07-17 MED ORDER — ONDANSETRON HCL 4 MG/2ML IJ SOLN: 4.0000 mg | Freq: Four times a day (QID) | INTRAMUSCULAR | Status: DC | PRN

## 2024-07-17 MED ORDER — CALCIUM CARBONATE ANTACID 500 MG PO CHEW
200.0000 mg | CHEWABLE_TABLET | Freq: Once | ORAL | Status: AC
  Administered 2024-07-17: 200 mg via ORAL
  Filled 2024-07-17: qty 1

## 2024-07-17 MED ORDER — IRBESARTAN 300 MG PO TABS
300.0000 mg | ORAL_TABLET | Freq: Every day | ORAL | Status: DC
  Filled 2024-07-17: qty 1

## 2024-07-17 MED ORDER — METOPROLOL SUCCINATE ER 25 MG PO TB24
12.5000 mg | ORAL_TABLET | Freq: Every day | ORAL | 0 refills | Status: DC
  Filled 2024-07-17: qty 15, 30d supply, fill #0

## 2024-07-17 MED ORDER — CYCLOBENZAPRINE HCL 10 MG PO TABS
10.0000 mg | ORAL_TABLET | Freq: Once | ORAL | Status: AC
  Administered 2024-07-17: 10 mg via ORAL
  Filled 2024-07-17: qty 1

## 2024-07-17 MED ORDER — BACLOFEN 10 MG PO TABS
5.0000 mg | ORAL_TABLET | Freq: Once | ORAL | Status: AC
  Administered 2024-07-17: 5 mg via ORAL
  Filled 2024-07-17: qty 1

## 2024-07-17 MED ORDER — METOPROLOL SUCCINATE ER 25 MG PO TB24
12.5000 mg | ORAL_TABLET | Freq: Every day | ORAL | Status: DC
  Administered 2024-07-17: 12.5 mg via ORAL
  Filled 2024-07-17: qty 1

## 2024-07-17 NOTE — Discharge Summary (Signed)
 Physician Discharge Summary   Patient: Marvin Carr MRN: [id number] DOB: 1955-09-15  Admit date:     07/16/2024  Discharge date: 07/17/24  Discharge Physician: Lebron JINNY Cage   PCP: [name], MD   Recommendations at discharge:    Follow up with PCP Follow up with cardiology  Discharge Diagnoses: Principal Problem:   Chest pain Active Problems:   Costochondral chest pain   S/P primary angioplasty with coronary stent    Hospital Course: [name] is a 68 y.o. 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 Not brought on by activity. Also endorses heart burn.  In the emergency department, he was hemodynamically stable with negative troponin x2.  Cardiology consulted, chest pain most consistent with costochondritis, medication adjusted, no further workup.  Follow-up with PCP, cardiology as scheduled.   Today, patient denies any further chest pain, noted recent heartburn of which she was previously prescribed a PPI, but has not been compliant with it.  Discussed extensively with patient to return to the ED if chest pain remains persistent despite taking PPIs, home medications, as well as lidocaine  patch prescribed by cardiology, patient verbalized understanding.    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 and patient was instructed, not to drive, operate heavy machinery, perform activities at heights, swimming or participation in water  activities or provide baby-sitting services while on Pain, Sleep and Anxiety Medications; until their outpatient Physician has advised to do so again. Also recommended to not to take more than prescribed Pain, Sleep and Anxiety Medications.    Consultants: Cardiology Procedures performed: None Disposition: Home Diet recommendation:  Cardiac and Carb modified diet    DISCHARGE MEDICATION: Allergies as of 07/17/2024   No Known Allergies      Medication List     STOP taking these medications    omeprazole 40 MG capsule Commonly known as: PRILOSEC       TAKE these medications    acetaminophen  500 MG tablet Commonly known as: TYLENOL  Take 2 tablets (1,000 mg total) by mouth every 6 (six) hours.   amoxicillin 500 MG capsule Commonly known as: AMOXIL SMARTSIG:4 Capsule(s) By Mouth As Directed   aspirin  EC 81 MG tablet Take 1 tablet (81 mg total) by mouth daily. Swallow whole.   atorvastatin  20 MG tablet Commonly known as: LIPITOR Take 1 tablet (20 mg total) by mouth daily.   benzonatate  200 MG capsule Commonly known as: TESSALON  Take 1 capsule (200 mg total) by mouth 3 (three) times daily as needed for cough.   bumetanide  2 MG tablet Commonly known as: BUMEX  Take 1 tablet (2 mg total) by mouth daily as needed.   buPROPion  300 MG 24 hr tablet Commonly known as: WELLBUTRIN  XL Take 300 mg by mouth daily.   cetirizine  10 MG tablet Commonly known as: ZYRTEC  Take 10 mg by mouth daily.  cyclobenzaprine  10 MG tablet Commonly known as: FLEXERIL  Take 1 tablet (10 mg total) by mouth every 6 (six) hours as needed for muscle spasms.   dexamethasone  4 MG tablet Commonly known as: DECADRON  Take 2 tablets (8 mg total) by mouth daily.  Start the day after chemotherapy for 2 days. Take with food.   docusate sodium 100 MG capsule Commonly known as: COLACE Take 200 mg by mouth at bedtime.   Eszopiclone 3 MG Tabs Take 3 mg by mouth at bedtime.   famotidine  20 MG tablet Commonly known as: PEPCID  Take 20 mg by mouth at bedtime.   Farxiga 10 MG Tabs tablet Generic drug: dapagliflozin propanediol Take 10 mg by mouth daily.   fenofibrate  160 MG tablet Take 160 mg by mouth daily.   guaiFENesin 600 MG 12 hr tablet Commonly known as: MUCINEX Take 600 mg by mouth 2 (two) times daily as needed for cough or to loosen phlegm.   IMFINZI  IV Inject into the vein every 21 ( twenty-one) days.   lidocaine  5 % Commonly known as: LIDODERM  Place 1 patch onto the skin daily. Remove & Discard patch within 12 hours or as directed by MD Start taking on: July 18, 2024   magic mouthwash (lidocaine , diphenhydrAMINE , alum & mag hydroxide) suspension Swish and swallow 5 mLs 3 (three) times daily as needed for mouth pain.   magnesium  oxide 400 (240 Mg) MG tablet Commonly known as: MAG-OX Take 1 tablet (400 mg total) by mouth 2 (two) times daily. What changed:  how much to take when to take this   metFORMIN  500 MG 24 hr tablet Commonly known as: GLUCOPHAGE -XR Take 500 mg by mouth in the morning and at bedtime.   metoprolol  succinate 25 MG 24 hr tablet Commonly known as: TOPROL -XL Take 0.5 tablets (12.5 mg total) by mouth daily. Start taking on: July 18, 2024   nitroGLYCERIN  0.4 MG SL tablet Commonly known as: NITROSTAT  Place 0.4 mg under the tongue every 5 (five) minutes x 3 doses as needed for chest pain (if no relief after 2nd dose, proceed to ED or call 911).   olmesartan  40 MG tablet Commonly known as: BENICAR  TAKE ONE TABLET BY MOUTH EVERY DAY   ondansetron  8 MG tablet Commonly known as: Zofran  Take 1 tablet (8 mg total) by mouth every 8 (eight) hours as needed for nausea or vomiting. Start on the third day  after chemotherapy.   pantoprazole  40 MG tablet Commonly known as: Protonix  Take 1 tablet (40 mg total) by mouth daily.   pramipexole  0.75 MG tablet Commonly known as: MIRAPEX  Take 1 tablet (0.75 mg total) by mouth 3 (three) times daily.   prochlorperazine  10 MG tablet Commonly known as: COMPAZINE  Take 1 tablet (10 mg total) by mouth every 6 (six) hours as needed for nausea or vomiting.   tamsulosin  0.4 MG Caps capsule Commonly known as: FLOMAX  Take 0.4 mg by mouth daily.   ticagrelor 90 MG Tabs tablet Commonly known as: BRILINTA Take 1 tablet (90 mg total) by mouth 2 (two) times daily.   traMADol  50 MG tablet Commonly known as: ULTRAM  Take 50 mg by mouth at bedtime as needed for severe pain (pain score 7-10).   traZODone  100 MG tablet Commonly known as: DESYREL  Take 1 tablet (100 mg total) by mouth at bedtime. What changed: how much to take   triamcinolone 0.1 % paste Commonly known as: KENALOG Use as directed 1 Application in the mouth or throat daily as needed (for mouth  ulcers).        Follow-up Information     [name], MD. Schedule an appointment as soon as possible for a visit in 1 week(s).   Specialty: Internal Medicine Contact information: [address] [place] [facility] [address] [phone]                Discharge Exam: Filed Weights   07/16/24 1959  Weight: 117.9 kg   General: NAD  Cardiovascular: S1, S2 present Respiratory: CTAB Abdomen: Soft, nontender, nondistended, bowel sounds present Musculoskeletal: No bilateral pedal edema noted Skin: Normal Psychiatry: Normal mood   Condition at discharge: good  The results of significant diagnostics from this hospitalization (including imaging, microbiology, ancillary and laboratory) are listed below for reference.   Imaging Studies: DG Chest 2 View Result Date: 07/16/2024 CLINICAL DATA:  Chest pain. EXAM: CHEST - 2 VIEW COMPARISON:  July 03, 2024 FINDINGS: There is stable  right-sided venous Port-A-Cath positioning. The heart size and mediastinal contours are within normal limits. Mild atelectasis and/or infiltrate is seen within the left lung base. This is decreased in severity when compared to the prior study. There is a small, stable left pleural effusion. No pneumothorax is identified. The visualized skeletal structures are unremarkable. IMPRESSION: 1. Mild left basilar atelectasis and/or infiltrate, decreased in severity when compared to the prior study. 2. Small, stable left pleural effusion. Electronically Signed   By: Suzen Dials M.D.   On: 07/16/2024 20:43   VAS US  UPPER EXTREMITY VENOUS DUPLEX Result Date: 07/07/2024 UPPER VENOUS STUDY  Patient Name:  MATAIO MELE  Date of Exam:   07/06/2024 Medical Rec #: [id number]        Accession #:    [id number] Date of Birth: [birth year]        Patient Gender: M Patient Age:   73 years Exam Location:  [facility] Procedure:      VAS US  UPPER EXTREMITY VENOUS DUPLEX Referring Phys: DUFFY AL-SULTANI --------------------------------------------------------------------------------  Indications: Pain Risk Factors: Trauma. Limitations: Poor ultrasound/tissue interface. Comparison Study: No prior studies. Performing Technologist: Cordella Collet RVT  Examination Guidelines: A complete evaluation includes B-mode imaging, spectral Doppler, color Doppler, and power Doppler as needed of all accessible portions of each vessel. Bilateral testing is considered an integral part of a complete examination. Limited examinations for reoccurring indications may be performed as noted.  Right Findings: +----------+------------+---------+-----------+----------+-------+ RIGHT     CompressiblePhasicitySpontaneousPropertiesSummary +----------+------------+---------+-----------+----------+-------+ IJV           Full       Yes       Yes                      +----------+------------+---------+-----------+----------+-------+  Subclavian               Yes       Yes                      +----------+------------+---------+-----------+----------+-------+ Axillary      Full       Yes       Yes                      +----------+------------+---------+-----------+----------+-------+ Brachial      Full                                          +----------+------------+---------+-----------+----------+-------+  Radial        Full                                          +----------+------------+---------+-----------+----------+-------+ Ulnar         Full                                          +----------+------------+---------+-----------+----------+-------+ Cephalic      Full                                          +----------+------------+---------+-----------+----------+-------+ Basilic       Full                                          +----------+------------+---------+-----------+----------+-------+  Left Findings: +----------+------------+---------+-----------+----------+-------+ LEFT      CompressiblePhasicitySpontaneousPropertiesSummary +----------+------------+---------+-----------+----------+-------+ Subclavian               Yes       Yes                      +----------+------------+---------+-----------+----------+-------+  Summary:  Right: No evidence of deep vein thrombosis in the upper extremity. No evidence of superficial vein thrombosis in the upper extremity.  Left: No evidence of thrombosis in the subclavian.  *See table(s) above for measurements and observations.  Diagnosing physician: Gaile New MD Electronically signed by Gaile New MD on 07/07/2024 at 10:25:45 AM.    Final    CARDIAC CATHETERIZATION Result Date: 07/04/2024   Mid Cx to Dist Cx lesion is 80% stenosed.   2nd Diag lesion is 95% stenosed.   A stent was successfully placed.   Post intervention, there is a 0% residual stenosis. 1.  High-grade distal left circumflex lesion treated with one  3.5 x 16 mm Synergy XD stent postdilated to 3.75 mm. 2.  LVEDP of 20 mmHg. Recommendation: Dual antiplatelet therapy for at least 6 months but preferably 1 year with aspirin  and Brilinta followed by Brilinta monotherapy indefinitely.   ECHOCARDIOGRAM COMPLETE Result Date: 07/03/2024    ECHOCARDIOGRAM REPORT   Patient Name:   GEOFFRY BANNISTER Date of Exam: 07/03/2024 Medical Rec #:  [id number]       Height:       72.0 in Accession #:    [id number]      Weight:       272.9 lb Date of Birth:  1956/07/12       BSA:          2.432 m Patient Age:    68 years        BP:           104/70 mmHg Patient Gender: M               HR:           82 bpm. Exam Location:  [facility] Procedure: 2D Echo, Cardiac Doppler and Color Doppler (Both Spectral and Color            Flow Doppler were utilized during procedure). Indications:    Chest Pain R07.9  History:        Patient has prior history of Echocardiogram examinations, most                 recent 04/25/2023. CAD; Risk Factors:Hypertension and Sleep                 Apnea. H/O Hyperlipidemia, Dyspnea on exertion.  Sonographer:    BERNARDA ROCKS Referring Phys: [id number] SCOTESIA Y DUNLAP IMPRESSIONS  1. Left ventricular ejection fraction, by estimation, is 60 to 65%. The left ventricle has normal function. The left ventricle has no regional wall motion abnormalities. Left ventricular diastolic parameters are consistent with Grade I diastolic dysfunction (impaired relaxation).  2. Right ventricular systolic function is normal. The right ventricular size is normal.  3. The mitral valve is normal in structure. No evidence of mitral valve regurgitation. No evidence of mitral stenosis.  4. The aortic valve was not well visualized. Aortic valve regurgitation is not visualized. No aortic stenosis is present.  5. Aortic dilatation noted. There is mild dilatation of the aortic root, measuring 41 mm.  6. The inferior vena cava is normal in size with greater than 50% respiratory variability,  suggesting right atrial pressure of 3 mmHg. Comparison(s): No significant change from prior study. FINDINGS  Left Ventricle: Left ventricular ejection fraction, by estimation, is 60 to 65%. The left ventricle has normal function. The left ventricle has no regional wall motion abnormalities. Strain was performed and the global longitudinal strain is indeterminate. The left ventricular internal cavity size was normal in size. There is no left ventricular hypertrophy. Left ventricular diastolic parameters are consistent with Grade I diastolic dysfunction (impaired relaxation). Normal left ventricular filling pressure. Right Ventricle: The right ventricular size is normal. No increase in right ventricular wall thickness. Right ventricular systolic function is normal. Left Atrium: Left atrial size was normal in size. Right Atrium: Right atrial size was normal in size. Pericardium: There is no evidence of pericardial effusion. Mitral Valve: The mitral valve is normal in structure. No evidence of mitral valve regurgitation. No evidence of mitral valve stenosis. MV peak gradient, 2.6 mmHg. The mean mitral valve gradient is 2.0 mmHg. Tricuspid Valve: The tricuspid valve is normal in structure. Tricuspid valve regurgitation is not demonstrated. No evidence of tricuspid stenosis. Aortic Valve: The aortic valve was not well visualized. Aortic valve regurgitation is not visualized. No aortic stenosis is present. Aortic valve mean gradient measures 3.0 mmHg. Aortic valve peak gradient measures 7.3 mmHg. Aortic valve area, by VTI measures 3.41 cm. Pulmonic Valve: The pulmonic valve was not well visualized. Pulmonic valve regurgitation is trivial. No evidence of pulmonic stenosis. Aorta: Aortic dilatation noted. There is mild dilatation of the aortic root, measuring 41 mm. Venous: The inferior vena cava is normal in size with greater than 50% respiratory variability, suggesting right atrial pressure of 3 mmHg. IAS/Shunts: No  atrial level shunt detected by color flow Doppler. Additional Comments: 3D was performed not requiring image post processing on an independent workstation and was indeterminate.  LEFT VENTRICLE PLAX 2D LVIDd:         4.80 cm      Diastology LVIDs:         3.20 cm      LV e' medial:    7.18 cm/s LV PW:         0.90 cm      LV E/e' medial:  8.3 LV IVS:        1.00 cm  LV e' lateral:   7.07 cm/s LVOT diam:     2.20 cm      LV E/e' lateral: 8.4 LV SV:         69 LV SV Index:   28 LVOT Area:     3.80 cm  LV Volumes (MOD) LV vol d, MOD A2C: 72.4 ml LV vol d, MOD A4C: 102.0 ml LV vol s, MOD A2C: 24.2 ml LV vol s, MOD A4C: 40.1 ml LV SV MOD A2C:     48.2 ml LV SV MOD A4C:     102.0 ml LV SV MOD BP:      55.3 ml RIGHT VENTRICLE             IVC RV Basal diam:  3.50 cm     IVC diam: 2.00 cm RV S prime:     13.50 cm/s TAPSE (M-mode): 2.2 cm LEFT ATRIUM             Index        RIGHT ATRIUM           Index LA diam:        3.40 cm 1.40 cm/m   RA Area:     13.20 cm LA Vol (A2C):   21.1 ml 8.68 ml/m   RA Volume:   29.40 ml  12.09 ml/m LA Vol (A4C):   28.8 ml 11.84 ml/m LA Biplane Vol: 25.5 ml 10.49 ml/m  AORTIC VALVE                    PULMONIC VALVE AV Area (Vmax):    2.80 cm     PV Vmax:       1.09 m/s AV Area (Vmean):   2.87 cm     PV Peak grad:  4.8 mmHg AV Area (VTI):     3.41 cm AV Vmax:           135.00 cm/s AV Vmean:          82.600 cm/s AV VTI:            0.202 m AV Peak Grad:      7.3 mmHg AV Mean Grad:      3.0 mmHg LVOT Vmax:         99.60 cm/s LVOT Vmean:        62.300 cm/s LVOT VTI:          0.181 m LVOT/AV VTI ratio: 0.90  AORTA Ao Root diam: 4.10 cm Ao Asc diam:  3.60 cm MITRAL VALVE MV Area (PHT): 3.81 cm    SHUNTS MV Area VTI:   2.78 cm    Systemic VTI:  0.18 m MV Peak grad:  2.6 mmHg    Systemic Diam: 2.20 cm MV Mean grad:  2.0 mmHg MV Vmax:       0.80 m/s MV Vmean:      60.0 cm/s MV Decel Time: 199 msec MV E velocity: 59.70 cm/s MV A velocity: 54.00 cm/s MV E/A ratio:  1.11 Vishnu Priya  Mallipeddi Electronically signed by Diannah Late Mallipeddi Signature Date/Time: 07/03/2024/3:22:31 PM    Final    CT Angio Chest PE W and/or Wo Contrast Result Date: 07/03/2024 EXAM: CTA of the Chest with contrast for PE 07/03/2024 04:45:48 AM TECHNIQUE: CTA of the chest was performed without and with the administration of intravenous contrast. 75 mL of iohexol  (OMNIPAQUE ) 350 MG/ML injection. Multiplanar reformatted images are provided for review. MIP images are provided for review. Automated  exposure control, iterative reconstruction, and/or weight based adjustment of the mA/kV was utilized to reduce the radiation dose to as low as reasonably achievable. COMPARISON: 05/15/2024 CLINICAL HISTORY: Pulmonary embolism (PE) suspected, high prob. Cc center cp 3 hours Nausea no vomiting. Stabbing pain increased from 5 to 10/10 170/110 HR 78 FINDINGS: PULMONARY ARTERIES: Pulmonary arteries are adequately opacified for evaluation. No pulmonary embolism. Main pulmonary artery is normal in caliber. MEDIASTINUM: The heart and pericardium demonstrate coronary artery calcifications. Aortic atherosclerosis is present. LYMPH NODES: No mediastinal, hilar or axillary lymphadenopathy. LUNGS AND PLEURA: Atelectasis and subpleural consolidation change. New small left pleural effusion with overlying mild pleural thickening overlying the posterior right base. No pneumothorax. UPPER ABDOMEN: Corresponding to known malignancy suboptimally visualized reflecting phase of contrast enhancement. Index lesion within the anterior left lobe of liver measures approximately 1.8 x 1.5 cm image 114/4. Previously 1.5 x 1.0 cm. Peritoneal soft tissue nodularity is identified compared peritoneal thickening within the left upper quadrant of the abdomen with adjacent nodularity compatible with known peritoneal carcinomatosis. SOFT TISSUES AND BONES: No acute or suspicious osseous findings. No acute soft tissue abnormality. IMPRESSION: 1. No evidence  of pulmonary embolism. 2. New small left pleural effusion with overlying mild pleural thickening, consolidation and atelectasis. 3. Multifocal hypodense liver lesions compatible with known malignancy. Suboptimally visualized on the current exam due to phase of contrast opacification. 4. Peritoneal soft tissue nodularity compatible with peritoneal carcinomatosis. 5. Aortic atherosclerosis and coronary artery calcifications. Electronically signed by: Waddell Calk MD 07/03/2024 05:14 AM EDT RP Workstation: [id number]   DG Chest Portable 1 View Result Date: 07/03/2024 EXAM: 1 VIEW(S) XRAY OF THE CHEST 07/03/2024 12:27:00 AM COMPARISON: 06/01/2023 CLINICAL HISTORY: eval for pneumonia/chest pain. eval for pneumonia/chest pain - slight dizziness upon standing, mid AP region chest pressure, slight cough FINDINGS: LINES, TUBES AND DEVICES: Right chest port with tip in SVC. LUNGS AND PLEURA: Left basilar opacities. New small left pleural effusion. No pulmonary edema. No pneumothorax. HEART AND MEDIASTINUM: No acute abnormality of the cardiac and mediastinal silhouettes. BONES AND SOFT TISSUES: No acute osseous abnormality. IMPRESSION: 1. New small left pleural effusion with left basilar atelectasis or pneumonia. Electronically signed by: Norman Gatlin MD 07/03/2024 12:33 AM EDT RP Workstation: [id number]    Microbiology: Results for orders placed or performed during the hospital encounter of 07/02/24  MRSA Next Gen by PCR, Nasal     Status: None   Collection Time: 07/04/24  7:57 PM   Specimen: Nasal Mucosa; Nasal Swab  Result Value Ref Range Status   MRSA by PCR Next Gen NOT DETECTED NOT DETECTED Final    Comment: (NOTE) The GeneXpert MRSA Assay (FDA approved for NASAL specimens only), is one component of a comprehensive MRSA colonization surveillance program. It is not intended to diagnose MRSA infection nor to guide or monitor treatment for MRSA infections. Test performance is not FDA approved in  patients less than 29 years old. Performed at [facility], [address] [place], [place] [address]     Labs: CBC: Recent Labs  Lab 07/15/24 [phone] 07/16/24 2017 07/17/24 0637  WBC 5.9 4.9 3.4*  NEUTROABS 4.2  --   --   HGB 10.2* 12.0* 11.1*  HCT 32.9* 39.1 36.2*  MCV 95.9 96.3 95.5  PLT 183 203 178   Basic Metabolic Panel: Recent Labs  Lab 07/15/24 0839 07/16/24 2017 07/17/24 0637  NA 137 134*  --   K 3.9 4.8  --   CL 100 98  --  CO2 28 21*  --   GLUCOSE 126* 134*  --   BUN 19 27*  --   CREATININE 0.91 1.12 1.13  CALCIUM  8.8* 9.2  --   MG 1.5*  --   --    Liver Function Tests: Recent Labs  Lab 07/15/24 0839 07/17/24 0156  AST 18 21  ALT 10 13  ALKPHOS 99 67  BILITOT 0.3 0.8  PROT 6.1* 6.2*  ALBUMIN  3.4* 2.9*   CBG: Recent Labs  Lab 07/17/24 0737 07/17/24 1147  GLUCAP 93 85    Discharge time spent: less than 30 minutes.  Signed: Lebron JINNY Cage, MD [facility] 07/17/2024

## 2024-07-17 NOTE — ED Notes (Signed)
 Pt switched from stretcher to IP bed

## 2024-07-17 NOTE — ED Notes (Signed)
 The pt would like [facility] provider aware he is hospitalized and not able to make appt tomorrow.

## 2024-07-17 NOTE — Progress Notes (Signed)
 Rounding Note    Patient Name: Marvin Carr Date of Encounter: 07/17/2024  [facility] Cardiologist: Jayson Sierras, MD   Subjective   Patient reports that the severe sharp/hard pain started severely around 4 AM yesterday. Not related to food, position, activity. Worse with pressing on the area. He notes that he has had a strong cough for a while and feels that this worsened after coughing, but is not pleuritic. He has had intermittent chest pain for some time but this was severe. Reviewed his recent cath, discussed both cardiac and noncardiac chest pain, see below. He notes that the only thing that has helped the pain is pain medication.  Inpatient Medications    Scheduled Meds:  acetaminophen   1,000 mg Oral Q6H   aspirin  EC  81 mg Oral Daily   dapagliflozin propanediol  10 mg Oral Daily   enoxaparin  (LOVENOX ) injection  55 mg Subcutaneous Q24H   insulin  aspart  0-5 Units Subcutaneous QHS   insulin  aspart  0-9 Units Subcutaneous TID WC   irbesartan   300 mg Oral Daily   lidocaine   1 patch Transdermal Q24H   metoprolol  succinate  12.5 mg Oral Daily   pantoprazole  (PROTONIX ) IV  40 mg Intravenous Q12H   ticagrelor  90 mg Oral BID   Continuous Infusions:  PRN Meds: acetaminophen  **OR** acetaminophen , nitroGLYCERIN , ondansetron  **OR** ondansetron  (ZOFRAN ) IV, traMADol    Vital Signs    Vitals:   07/17/24 0500 07/17/24 0600 07/17/24 0739 07/17/24 0900  BP: 120/79 116/82 113/74 105/86  Pulse: 69 66 68 65  Resp:   16 18  Temp:   97.6 F (36.4 C)   TempSrc:   Axillary   SpO2: 97% 95% 97% 95%  Weight:      Height:        Intake/Output Summary (Last 24 hours) at 07/17/2024 1110 Last data filed at 07/17/2024 0053 Gross per 24 hour  Intake --  Output 300 ml  Net -300 ml      07/16/2024    7:59 PM 07/15/2024    8:40 AM 07/11/2024    1:04 PM  Last 3 Weights  Weight (lbs) 260 lb 268 lb 1.3 oz 270 lb  Weight (kg) 117.935 kg 121.6 kg 122.471 kg       Telemetry    SR - Personally Reviewed  Physical Exam   GEN: No acute distress.   Neck: No JVD Cardiac: RRR, no murmurs, rubs, or gallops. Focal tenderness to palpation over left upper sternal border which he states reproduces his pain Respiratory: Clear to auscultation bilaterally. GI: Soft, nontender, non-distended  MS: No edema; No deformity. Neuro:  Nonfocal  Psych: Normal affect   New pertinent results (labs, ECG, imaging, cardiac studies)     Assessment & Plan    Chest pain, most consistent with costochondritis CAD with recent cath and PCI -his pain with prior admission was somewhat similar, but his hsTn at that time were elevated. It is reassuring that hsTn are normal now and pain is reproducible on palpation -most consistent with costocondritis, discussed heat, topical pain relief for this. Antiinflammatories are an option short term though we try to avoid NSAIDs long term with CAD -will trial topical lidocaine  patch -he was started on low dose metoprolol  succinate as anti-anginal. No significant change with this, but he is tolerating. Blood pressure too low for amlodipine /imdur right now.  -we discussed cardiac pain (angina), MSK, GI. Discussed that there are some things that help us  differentiate but if there is  a question, the ER is the right place to be evaluated.  For CAD/recent PCI, continue aspirin , brilinta, atorvastatin   [facility] will sign off.   Medication Recommendations:  restart home medications, add metoprolol  succinate 25 mg daily. Trial topical lidocaine  patch Other recommendations (labs, testing, etc):  none Follow up as an outpatient:  He is scheduled to see Dr. Debera in January 2026 as he just saw Brittany Strader. Will try to get him a closer follow up     Signed, Shelda Bruckner, MD  07/17/2024, 11:10 AM

## 2024-07-17 NOTE — ED Notes (Signed)
Temp 98.2 oral

## 2024-07-17 NOTE — ED Notes (Signed)
 CCMD notified

## 2024-07-17 NOTE — ED Provider Notes (Signed)
 Care assumed at 2300.  Patient with history of cholangiocarcinoma here for evaluation of chest pain.  Care assumed pending repeat troponin and cardiology consult.  Repeat Troponin is negative.  Cardiology has seen the patient with plan to admit to medicine service for observation.  Hospitalist consulted for admission.   Griselda Norris, MD 07/17/24 [phone]

## 2024-07-17 NOTE — ED Notes (Signed)
 Pt was wondering if any medication can help with his hiccups. RN message provider.

## 2024-07-17 NOTE — H&P (Signed)
 History and Physical    Patient: Marvin Carr FMW:[id number] DOB: 02-26-1956 DOA: 07/16/2024 DOS: the patient was seen and examined on 07/17/2024 PCP: [name], MD  Patient coming from: Home  Chief Complaint:  Chief Complaint  Patient presents with   Chest Pain   HPI: Marvin Carr is a 68 y.o. 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 He reports associated nausea, diaphoresis, shortness of breath. Not brought on by activity. Also endorses heart burn.   In the emergency department, he was hemodynamically stable with negative troponin x2.  Cardiology consulted in the ED and recommended admission for medication titration, possible stress test today.   Review of Systems: Review of Systems  Constitutional:  Positive for diaphoresis. Negative for chills, fever, malaise/fatigue and weight loss.  Eyes:  Negative for blurred vision and double vision.  Respiratory:  Positive for cough and shortness of breath. Negative for hemoptysis, sputum production and wheezing.   Cardiovascular:  Positive for chest pain. Negative for palpitations and leg swelling.  Gastrointestinal:  Positive for heartburn and nausea. Negative for abdominal pain and vomiting.  Genitourinary:  Negative for dysuria and frequency.  Neurological:  Negative for dizziness and headaches.    Past Medical History:  Diagnosis Date   Anxiety    Arthritis    Bladder cancer (HCC) 09/11/2009   CAD (coronary artery disease), native coronary artery    a. Mildly elevated troponin 03/2013, cath with nonobstructive disease including 50% AV groove distal stenosis before large OM   Depression    Essential hypertension    Headache(784.0)    History of migraines    Hyperglycemia    Metastatic cancer to liver [facility])    Mixed hyperlipidemia    Neuropathy    Obesity    Port-A-Cath in place 09/30/2021   Pre-diabetes    Seasonal allergies    Sleep apnea    On CPAP   Past Surgical History:  Procedure Laterality Date   BIOPSY  09/23/2021   Procedure: BIOPSY;  Surgeon: Eartha Angelia Sieving, MD;  Location: [facility];  Service: Gastroenterology;;   COLONOSCOPY  06/28/2011   Procedure: COLONOSCOPY;  Surgeon: Claudis RAYMOND Rivet, MD;  Location: [facility];  Service: Endoscopy;  Laterality: N/A;   COLONOSCOPY WITH PROPOFOL  N/A 09/23/2021   Procedure: COLONOSCOPY WITH PROPOFOL ;  Surgeon: Eartha Angelia Sieving, MD;  Location: [facility];  Service: Gastroenterology;  Laterality: N/A;  940   CORONARY STENT INTERVENTION N/A 07/04/2024   Procedure: CORONARY STENT INTERVENTION;  Surgeon: Wendel Lurena POUR, MD;  Location: [facility];  Service: Cardiovascular;  Laterality: N/A;   ESOPHAGOGASTRODUODENOSCOPY (EGD) WITH PROPOFOL  N/A 09/23/2021   Procedure: ESOPHAGOGASTRODUODENOSCOPY (EGD) WITH PROPOFOL ;  Surgeon: Eartha Angelia Sieving, MD;  Location: [facility];  Service: Gastroenterology;  Laterality: N/A;   IR IMAGING GUIDED PORT INSERTION  09/28/2021   IR PARACENTESIS  09/28/2021   JOINT REPLACEMENT Right    hip   LEFT HEART CATH AND CORONARY ANGIOGRAPHY N/A 07/04/2024   Procedure: LEFT HEART CATH AND CORONARY ANGIOGRAPHY;  Surgeon: Wendel Lurena POUR, MD;  Location: [facility];  Service: Cardiovascular;  Laterality: N/A;   LEFT HEART CATHETERIZATION WITH CORONARY ANGIOGRAM N/A 03/31/2013   Procedure: LEFT HEART CATHETERIZATION WITH CORONARY ANGIOGRAM;  Surgeon: Lynwood Schilling,  MD;  Location: [facility] CATH [facility];  Service: Cardiovascular;  Laterality: N/A;   POLYPECTOMY  09/23/2021   Procedure: POLYPECTOMY;  Surgeon: Eartha Angelia Sieving, MD;  Location: [facility];  Service: Gastroenterology;;   TOTAL HIP ARTHROPLASTY Left 01/15/2024    Procedure: ARTHROPLASTY, HIP, TOTAL, ANTERIOR APPROACH;  Surgeon: Ernie Cough, MD;  Location: [facility];  Service: Orthopedics;  Laterality: Left;   TURBT  09/11/2009   Social History:  reports that he quit smoking about 13 years ago. His smoking use included cigarettes. He started smoking about 23 years ago. He has a 5 pack-year smoking history. He has been exposed to tobacco smoke. He has never used smokeless tobacco. He reports current alcohol use. He reports that he does not use drugs.  No Known Allergies  Family History  Problem Relation Age of Onset   COPD Father     Prior to Admission medications   Medication Sig Start Date End Date Taking? Authorizing Provider  acetaminophen  (TYLENOL ) 500 MG tablet Take 2 tablets (1,000 mg total) by mouth every 6 (six) hours. 01/16/24   Patti Rosina SAUNDERS, PA-C  amoxicillin (AMOXIL) 500 MG capsule SMARTSIG:4 Capsule(s) By Mouth As Directed 07/09/24   [provider]  aspirin  EC 81 MG tablet Take 1 tablet (81 mg total) by mouth daily. Swallow whole. 07/07/24   Al-Sultani, Anmar, MD  atorvastatin  (LIPITOR) 20 MG tablet Take 1 tablet (20 mg total) by mouth daily. 07/11/24 10/09/24  Strader, Laymon HERO, PA-C  benzonatate  (TESSALON ) 200 MG capsule Take 1 capsule (200 mg total) by mouth 3 (three) times daily as needed for cough. 07/14/24   Geofm Delon BRAVO, NP  bumetanide  (BUMEX ) 2 MG tablet Take 1 tablet (2 mg total) by mouth daily as needed. 05/21/24   Strader, Laymon HERO, PA-C  buPROPion  (WELLBUTRIN  XL) 300 MG 24 hr tablet Take 300 mg by mouth daily. 09/17/23   [provider]  cetirizine  (ZYRTEC ) 10 MG tablet Take 10 mg by mouth daily.    [provider]  cyclobenzaprine  (FLEXERIL ) 10 MG tablet Take 1 tablet (10 mg total) by mouth every 6 (six) hours as needed for muscle spasms. 07/15/24   Geofm Delon BRAVO, NP  dapagliflozin propanediol (FARXIGA) 10 MG TABS tablet Take 10 mg by mouth daily.    [provider]  dexamethasone   (DECADRON ) 4 MG tablet Take 2 tablets (8 mg total) by mouth daily. Start the day after chemotherapy for 2 days. Take with food. 07/01/24   Davonna Siad, MD  docusate sodium (COLACE) 100 MG capsule Take 200 mg by mouth at bedtime.    [provider]  Durvalumab  (IMFINZI  IV) Inject into the vein every 21 ( twenty-one) days. 10/05/21   [provider]  Eszopiclone 3 MG TABS Take 3 mg by mouth at bedtime.    [provider]  famotidine  (PEPCID ) 20 MG tablet Take 20 mg by mouth at bedtime. 09/17/23   [provider]  fenofibrate  160 MG tablet Take 160 mg by mouth daily. 11/05/19   [provider]  guaiFENesin (MUCINEX) 600 MG 12 hr tablet Take 600 mg by mouth 2 (two) times daily as needed for cough or to loosen phlegm.    [provider]  magic mouthwash (lidocaine , diphenhydrAMINE , alum & mag hydroxide) suspension Swish and swallow 5 mLs 3 (three) times daily as needed for mouth pain. 07/15/24   Geofm Delon BRAVO, NP  magnesium  oxide (MAG-OX) 400 (240 Mg) MG tablet Take 1 tablet (400 mg total) by  mouth 2 (two) times daily. Patient taking differently: Take 200 mg by mouth daily. 11/16/21   Katragadda, Sreedhar, MD  metFORMIN  (GLUCOPHAGE -XR) 500 MG 24 hr tablet Take 500 mg by mouth daily with breakfast. 08/16/21   [provider]  nitroGLYCERIN  (NITROSTAT ) 0.4 MG SL tablet Place 0.4 mg under the tongue every 5 (five) minutes x 3 doses as needed for chest pain (if no relief after 2nd dose, proceed to ED or call 911).    [provider]  olmesartan  (BENICAR ) 40 MG tablet TAKE ONE TABLET BY MOUTH EVERY DAY 01/27/24   Rogers Hai, MD  omeprazole (PRILOSEC) 40 MG capsule Take 40 mg by mouth daily as needed (heartburn, acid reflux). 01/25/24   [provider]  ondansetron  (ZOFRAN ) 8 MG tablet Take 1 tablet (8 mg total) by mouth every 8 (eight) hours as needed for nausea or vomiting. Start on the third day after chemotherapy.  07/01/24   Kandala, Hyndavi, MD  pramipexole  (MIRAPEX ) 0.75 MG tablet Take 1 tablet (0.75 mg total) by mouth 3 (three) times daily. 08/30/22   Rogers Hai, MD  prochlorperazine  (COMPAZINE ) 10 MG tablet Take 1 tablet (10 mg total) by mouth every 6 (six) hours as needed for nausea or vomiting. 07/01/24   Davonna Siad, MD  tamsulosin  (FLOMAX ) 0.4 MG CAPS capsule Take 0.4 mg by mouth daily. 08/29/21   [provider]  ticagrelor (BRILINTA) 90 MG TABS tablet Take 1 tablet (90 mg total) by mouth 2 (two) times daily. 07/07/24   Al-Sultani, Anmar, MD  traMADol  (ULTRAM ) 50 MG tablet Take 50 mg by mouth at bedtime as needed for severe pain (pain score 7-10).    [provider]  traZODone  (DESYREL ) 100 MG tablet Take 1 tablet (100 mg total) by mouth at bedtime. 06/25/24   Kandala, Hyndavi, MD  triamcinolone (KENALOG) 0.1 % paste SMARTSIG:TO TEETH 07/11/24   [provider]    Physical Exam: Vitals:   07/17/24 0400 07/17/24 0420 07/17/24 0500 07/17/24 0600  BP: 110/70  120/79 116/82  Pulse: 69  69 66  Resp:      Temp:  (!) 97.5 F (36.4 C)    TempSrc:  Oral    SpO2: 97%  97% 95%  Weight:      Height:       Physical Exam Vitals reviewed.  HENT:     Head: Normocephalic and atraumatic.  Eyes:     Extraocular Movements: Extraocular movements intact.     Pupils: Pupils are equal, round, and reactive to light.  Cardiovascular:     Rate and Rhythm: Normal rate and regular rhythm.  Pulmonary:     Effort: Pulmonary effort is normal. No tachypnea or respiratory distress.  Abdominal:     Palpations: Abdomen is soft.     Tenderness: There is no abdominal tenderness.  Musculoskeletal:     Cervical back: Normal range of motion and neck supple.     Right lower leg: No edema.     Left lower leg: No edema.  Skin:    General: Skin is warm and dry.     Capillary Refill: Capillary refill takes less than 2 seconds.  Neurological:     General: No focal deficit  present.     Mental Status: He is alert and oriented to person, place, and time.  Psychiatric:        Mood and Affect: Mood normal.        Behavior: Behavior normal.     Data Reviewed:  Labs on Admission: I have personally reviewed following labs and imaging studies  CBC: Recent Labs  Lab 07/15/24 0839 07/16/24 2017  WBC 5.9 4.9  NEUTROABS 4.2  --   HGB 10.2* 12.0*  HCT 32.9* 39.1  MCV 95.9 96.3  PLT 183 203   Basic Metabolic Panel: Recent Labs  Lab 07/15/24 0839 07/16/24 2017  NA 137 134*  K 3.9 4.8  CL 100 98  CO2 28 21*  GLUCOSE 126* 134*  BUN 19 27*  CREATININE 0.91 1.12  CALCIUM  8.8* 9.2  MG 1.5*  --    GFR: Estimated Creatinine Clearance: 82.5 mL/min (by C-G formula based on SCr of 1.12 mg/dL). Liver Function Tests: Recent Labs  Lab 07/15/24 0839 07/17/24 0156  AST 18 21  ALT 10 13  ALKPHOS 99 67  BILITOT 0.3 0.8  PROT 6.1* 6.2*  ALBUMIN  3.4* 2.9*   Recent Labs  Lab 07/17/24 0156  LIPASE 23   No results for input(s): AMMONIA in the last 168 hours. Coagulation Profile: No results for input(s): INR, PROTIME in the last 168 hours. Cardiac Enzymes: No results for input(s): CKTOTAL, CKMB, CKMBINDEX, TROPONINI in the last 168 hours. BNP (last 3 results) Recent Labs    07/03/24 0023  PROBNP 218.0   HbA1C: No results for input(s): HGBA1C in the last 72 hours. CBG: No results for input(s): GLUCAP in the last 168 hours. Lipid Profile: No results for input(s): CHOL, HDL, LDLCALC, TRIG, CHOLHDL, LDLDIRECT in the last 72 hours. Thyroid  Function Tests: No results for input(s): TSH, T4TOTAL, FREET4, T3FREE, THYROIDAB in the last 72 hours. Anemia Panel: No results for input(s): VITAMINB12, FOLATE, FERRITIN, TIBC, IRON, RETICCTPCT in the last 72 hours. Urine analysis:    Component Value Date/Time   COLORURINE YELLOW 05/15/2024 1719   APPEARANCEUR CLEAR 05/15/2024 1719   LABSPEC 1.036 (H)  05/15/2024 1719   PHURINE 6.0 05/15/2024 1719   GLUCOSEU NEGATIVE 05/15/2024 1719   HGBUR NEGATIVE 05/15/2024 1719   BILIRUBINUR NEGATIVE 05/15/2024 1719   KETONESUR NEGATIVE 05/15/2024 1719   PROTEINUR NEGATIVE 05/15/2024 1719   NITRITE NEGATIVE 05/15/2024 1719   LEUKOCYTESUR NEGATIVE 05/15/2024 1719    Radiological Exams on Admission: DG Chest 2 View Result Date: 07/16/2024 CLINICAL DATA:  Chest pain. EXAM: CHEST - 2 VIEW COMPARISON:  July 03, 2024 FINDINGS: There is stable right-sided venous Port-A-Cath positioning. The heart size and mediastinal contours are within normal limits. Mild atelectasis and/or infiltrate is seen within the left lung base. This is decreased in severity when compared to the prior study. There is a small, stable left pleural effusion. No pneumothorax is identified. The visualized skeletal structures are unremarkable. IMPRESSION: 1. Mild left basilar atelectasis and/or infiltrate, decreased in severity when compared to the prior study. 2. Small, stable left pleural effusion. Electronically Signed   By: Suzen Dials M.D.   On: 07/16/2024 20:43       Assessment and Plan:  68 y.o. 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 Negative tropnins x2.   Admitted for uptitration of antianginal medications plus or minus stress test. TIMI 4  Chest pain CAD status post DES to Lcx 07/04/2024 - Cardiology consult in the emergency department, appreciate recommendations.  - Continue DAPT -Start irbesartan  300 mg daily in place of olmesartan  - Start metoprolol  succinate 12.5 mg daily -- Continue dapagliflozin 10mg  every day  - Possible stress test --  protonix    T2DM- SSI   Lovenox   NPO No IVF  Monitor/replace electrolytes   Advance Care Planning:   Code Status: Do not attempt resuscitation (DNR) PRE-ARREST INTERVENTIONS  DESIRED discussed with patient at the time of admission   Consults: Cardiology   Severity of Illness: The appropriate patient status for this patient is OBSERVATION. Observation status is judged to be reasonable and necessary in order to provide the required intensity of service to ensure the patient's safety. The patient's presenting symptoms, physical exam findings, and initial radiographic and laboratory data in the context of their medical condition is felt to place them at decreased risk for further clinical deterioration. Furthermore, it is anticipated that the patient will be medically stable for discharge from the hospital within 2 midnights of admission.   Author: Daved JAYSON Pump, DO 07/17/2024 6:22 AM  For on call review [email or web address].

## 2024-07-17 NOTE — ED Notes (Signed)
 PT discharged to home. Breathing is even and unlabored. PT educated on follow up. PT to follow up as directed.

## 2024-07-17 NOTE — ED Notes (Signed)
 Secondary RN placed pt in recliner d/t back pain and cp.  RN messaged EDP for pain medication.

## 2024-07-17 NOTE — ED Notes (Signed)
 Pt is a hard stick and currently have port accessed for chemo tx.   RN placed IV consult since pt will be admitted

## 2024-07-22 ENCOUNTER — Encounter: Payer: Self-pay | Admitting: Hematology

## 2024-07-22 ENCOUNTER — Inpatient Hospital Stay (HOSPITAL_BASED_OUTPATIENT_CLINIC_OR_DEPARTMENT_OTHER): Admitting: Hematology

## 2024-07-22 ENCOUNTER — Other Ambulatory Visit: Payer: Self-pay | Admitting: Oncology

## 2024-07-22 ENCOUNTER — Inpatient Hospital Stay

## 2024-07-22 VITALS — BP 126/79 | HR 77 | Temp 97.5°F | Resp 20 | Wt 257.3 lb

## 2024-07-22 DIAGNOSIS — K219 Gastro-esophageal reflux disease without esophagitis: Secondary | ICD-10-CM | POA: Diagnosis not present

## 2024-07-22 DIAGNOSIS — C787 Secondary malignant neoplasm of liver and intrahepatic bile duct: Secondary | ICD-10-CM

## 2024-07-22 DIAGNOSIS — C221 Intrahepatic bile duct carcinoma: Secondary | ICD-10-CM

## 2024-07-22 DIAGNOSIS — I2510 Atherosclerotic heart disease of native coronary artery without angina pectoris: Secondary | ICD-10-CM | POA: Diagnosis not present

## 2024-07-22 DIAGNOSIS — E119 Type 2 diabetes mellitus without complications: Secondary | ICD-10-CM

## 2024-07-22 DIAGNOSIS — Z5111 Encounter for antineoplastic chemotherapy: Secondary | ICD-10-CM | POA: Diagnosis not present

## 2024-07-22 DIAGNOSIS — Z95828 Presence of other vascular implants and grafts: Secondary | ICD-10-CM

## 2024-07-22 DIAGNOSIS — R180 Malignant ascites: Secondary | ICD-10-CM | POA: Diagnosis not present

## 2024-07-22 DIAGNOSIS — I10 Essential (primary) hypertension: Secondary | ICD-10-CM

## 2024-07-22 MED ORDER — HYDROXYZINE HCL 25 MG PO TABS: 25.0000 mg | ORAL_TABLET | Freq: Three times a day (TID) | ORAL | 0 refills | Status: DC | PRN

## 2024-07-22 NOTE — Progress Notes (Signed)
 [facility]   Telephone:(336) [phone] Fax:([phone]) [phone]   Clinic New Consult Note   Patient Care Team: [name], MD as PCP - General (Internal Medicine) Debera Jayson MATSU, MD as PCP - Cardiology (Cardiology) Debera Jayson MATSU, MD as Consulting Physician (Cardiology) Onita Duos, MD as Consulting Physician (Neurology) Darlean Ozell NOVAK, MD as Consulting Physician (Pulmonary Disease) Johnson Laymon CHRISTELLA RIGGERS as Physician Assistant (Cardiology) 07/22/2024  CHIEF COMPLAINTS/PURPOSE OF CONSULTATION:  Metastatic cholangiocarcinoma  REFERRING PHYSICIAN: Self-referral  Discussed the use of AI scribe software for clinical note transcription with the patient, who gave verbal consent to proceed.  History of Present Illness Marvin Carr is a 68 year old male with metastatic cholangiocarcinoma who presents for a second opinion regarding his treatment plan. He is accompanied by his friend Lynwood. He was referred by Dr. Isiah for a second opinion on his treatment plan.  Diagnosed with metastatic cholangiocarcinoma in 2022, with cancer in the bile duct, liver, and peritoneum. Initial treatment included cisplatin , gemcitabine , and durvalumab  for six months, followed by durvalumab  alone until early 2025 when disease progression was observed.  He was restarted on cisplatin , gemcitabine  in addition to durvalumab .  In May 2025, after a second hip replacement, he missed two to three doses of treatment, coinciding with rising cancer markers. Resumed initial chemotherapy regimen, but due to limited efficacy, switched to FOLFOX in October 2025.  Experienced chest pain during the first FOLFOX treatment on July 01, 2024, leading to hospitalization and stent placement for an 80% coronary artery blockage. Similar chest pain occurred during the second FOLFOX treatment on July 15, 2024, and he was seen in ED, managed with nitroglycerin  and oxycodone .  Persistent hiccups for the  past five days, starting around the time of the last chemotherapy session. Heartburn is managed with pantoprazole , Pepcid , and Tums. Ascites due to peritoneal metastasis, with last drainage on June 13, 2024, removing 2.3 liters of fluid. Reports feeling somewhat bloated again.  Medication regimen includes metoprolol , atorvastatin , trazodone , and Wellbutrin . Discontinued gabapentin  due to weight gain, resolving neuropathy symptoms. No current neuropathy symptoms.     MEDICAL HISTORY:  Past Medical History:  Diagnosis Date   Anxiety    Arthritis    Bladder cancer (HCC) 09/11/2009   CAD (coronary artery disease), native coronary artery    a. Mildly elevated troponin 03/2013, cath with nonobstructive disease including 50% AV groove distal stenosis before large OM   Depression    Essential hypertension    Headache(784.0)    History of migraines   Hyperglycemia    Metastatic cancer to liver [facility])    Mixed hyperlipidemia    Neuropathy    Obesity    Port-A-Cath in place 09/30/2021   Pre-diabetes    Seasonal allergies    Sleep apnea    On CPAP    SURGICAL HISTORY: Past Surgical History:  Procedure Laterality Date   BIOPSY  09/23/2021   Procedure: BIOPSY;  Surgeon: Eartha Angelia Sieving, MD;  Location: [facility];  Service: Gastroenterology;;   COLONOSCOPY  06/28/2011   Procedure: COLONOSCOPY;  Surgeon: Claudis RAYMOND Rivet, MD;  Location: [facility];  Service: Endoscopy;  Laterality: N/A;   COLONOSCOPY WITH PROPOFOL  N/A 09/23/2021   Procedure: COLONOSCOPY WITH PROPOFOL ;  Surgeon: Eartha Angelia Sieving, MD;  Location: [facility];  Service: Gastroenterology;  Laterality: N/A;  940   CORONARY STENT INTERVENTION N/A 07/04/2024   Procedure: CORONARY STENT INTERVENTION;  Surgeon: Wendel Lurena POUR, MD;  Location: [facility] INVASIVE CV  LAB;  Service: Cardiovascular;  Laterality: N/A;   ESOPHAGOGASTRODUODENOSCOPY (EGD) WITH PROPOFOL  N/A 09/23/2021   Procedure: ESOPHAGOGASTRODUODENOSCOPY  (EGD) WITH PROPOFOL ;  Surgeon: Eartha Angelia Sieving, MD;  Location: [facility];  Service: Gastroenterology;  Laterality: N/A;   IR IMAGING GUIDED PORT INSERTION  09/28/2021   IR PARACENTESIS  09/28/2021   JOINT REPLACEMENT Right    hip   LEFT HEART CATH AND CORONARY ANGIOGRAPHY N/A 07/04/2024   Procedure: LEFT HEART CATH AND CORONARY ANGIOGRAPHY;  Surgeon: Wendel Lurena POUR, MD;  Location: [facility] INVASIVE CV LAB;  Service: Cardiovascular;  Laterality: N/A;   LEFT HEART CATHETERIZATION WITH CORONARY ANGIOGRAM N/A 03/31/2013   Procedure: LEFT HEART CATHETERIZATION WITH CORONARY ANGIOGRAM;  Surgeon: Lynwood Schilling, MD;  Location: [facility] CATH LAB;  Service: Cardiovascular;  Laterality: N/A;   POLYPECTOMY  09/23/2021   Procedure: POLYPECTOMY;  Surgeon: Eartha Angelia Sieving, MD;  Location: [facility];  Service: Gastroenterology;;   TOTAL HIP ARTHROPLASTY Left 01/15/2024   Procedure: ARTHROPLASTY, HIP, TOTAL, ANTERIOR APPROACH;  Surgeon: Ernie Cough, MD;  Location: [facility] ORS;  Service: Orthopedics;  Laterality: Left;   TURBT  09/11/2009    SOCIAL HISTORY: Social History   Socioeconomic History   Marital status: Divorced    Spouse name: Not on file   Number of children: 0   Years of education: college   Highest education level: Not on file  Occupational History    Employer: SELF-EMPLOYED  Tobacco Use   Smoking status: Former    Current packs/day: 0.00    Average packs/day: 0.5 packs/day for 10.0 years (5.0 ttl pk-yrs)    Types: Cigarettes    Start date: 07/09/2001    Quit date: 07/10/2011    Years since quitting: 13.0    Passive exposure: Past   Smokeless tobacco: Never   Tobacco comments:    Quit several yrs prior to 03/2013.  Vaping Use   Vaping status: Never Used  Substance and Sexual Activity   Alcohol use: Yes    Alcohol/week: 0.0 standard drinks of alcohol    Comment: Occasional   Drug use: No   Sexual activity: Not on file  Other Topics Concern   Not on file  Social  History Narrative   Not on file   Social Drivers of Health   Financial Resource Strain: Not on file  Food Insecurity: No Food Insecurity (07/09/2024)   Hunger Vital Sign    Worried About Running Out of Food in the Last Year: Never true    Ran Out of Food in the Last Year: Never true  Transportation Needs: No Transportation Needs (07/09/2024)   PRAPARE - Administrator, Civil Service (Medical): No    Lack of Transportation (Non-Medical): No  Physical Activity: Not on file  Stress: Not on file  Social Connections: Moderately Isolated (07/03/2024)   Social Connection and Isolation Panel    Frequency of Communication with Friends and Family: More than three times a week    Frequency of Social Gatherings with Friends and Family: More than three times a week    Attends Religious Services: More than 4 times per year    Active Member of [employer] or Organizations: No    Attends Banker Meetings: Never    Marital Status: Divorced  Catering Manager Violence: Not At Risk (07/09/2024)   Humiliation, Afraid, Rape, and Kick questionnaire    Fear of Current or Ex-Partner: No    Emotionally Abused: No    Physically Abused: No  Sexually Abused: No    FAMILY HISTORY: Family History  Problem Relation Age of Onset   Cancer Father        MDS   COPD Father    Cancer Paternal Grandfather        prostate cancer    ALLERGIES:  has no known allergies.  MEDICATIONS:  Current Outpatient Medications  Medication Sig Dispense Refill   acetaminophen  (TYLENOL ) 500 MG tablet Take 2 tablets (1,000 mg total) by mouth every 6 (six) hours.     aspirin  EC 81 MG tablet Take 1 tablet (81 mg total) by mouth daily. Swallow whole. 30 tablet 0   atorvastatin  (LIPITOR) 20 MG tablet Take 1 tablet (20 mg total) by mouth daily. 90 tablet 3   benzonatate  (TESSALON ) 200 MG capsule Take 1 capsule (200 mg total) by mouth 3 (three) times daily as needed for cough. 30 capsule 0   bumetanide  (BUMEX )  2 MG tablet Take 1 tablet (2 mg total) by mouth daily as needed. 90 tablet 1   buPROPion  (WELLBUTRIN  XL) 300 MG 24 hr tablet Take 300 mg by mouth daily.     cetirizine  (ZYRTEC ) 10 MG tablet Take 10 mg by mouth daily.     cyclobenzaprine  (FLEXERIL ) 10 MG tablet Take 1 tablet (10 mg total) by mouth every 6 (six) hours as needed for muscle spasms. 30 tablet 1   dapagliflozin propanediol (FARXIGA) 10 MG TABS tablet Take 10 mg by mouth daily.     dexamethasone  (DECADRON ) 4 MG tablet Take 2 tablets (8 mg total) by mouth daily. Start the day after chemotherapy for 2 days. Take with food. 30 tablet 1   docusate sodium (COLACE) 100 MG capsule Take 200 mg by mouth at bedtime.     Eszopiclone 3 MG TABS Take 3 mg by mouth at bedtime.     famotidine  (PEPCID ) 20 MG tablet Take 20 mg by mouth at bedtime.     fenofibrate  160 MG tablet Take 160 mg by mouth daily.     guaiFENesin (MUCINEX) 600 MG 12 hr tablet Take 600 mg by mouth 2 (two) times daily as needed for cough or to loosen phlegm.     hydrOXYzine (ATARAX) 25 MG tablet Take 1 tablet (25 mg total) by mouth 3 (three) times daily as needed (hiccups). 20 tablet 0   lidocaine  (LIDODERM ) 5 % Place 1 patch onto the skin daily. Remove & Discard patch within 12 hours or as directed by MD 30 patch 0   magic mouthwash (lidocaine , diphenhydrAMINE , alum & mag hydroxide) suspension Swish and swallow 5 mLs 3 (three) times daily as needed for mouth pain. 360 mL 0   magnesium  oxide (MAG-OX) 400 (240 Mg) MG tablet Take 1 tablet (400 mg total) by mouth 2 (two) times daily. (Patient taking differently: Take 200 mg by mouth daily.) 90 tablet 6   metFORMIN  (GLUCOPHAGE -XR) 500 MG 24 hr tablet Take 500 mg by mouth in the morning and at bedtime.     metoprolol  succinate (TOPROL -XL) 25 MG 24 hr tablet Take 0.5 tablets (12.5 mg total) by mouth daily. 15 tablet 0   nitroGLYCERIN  (NITROSTAT ) 0.4 MG SL tablet Place 0.4 mg under the tongue every 5 (five) minutes x 3 doses as needed for  chest pain (if no relief after 2nd dose, proceed to ED or call 911).     olmesartan  (BENICAR ) 40 MG tablet TAKE ONE TABLET BY MOUTH EVERY DAY 90 tablet 2   ondansetron  (ZOFRAN ) 8 MG tablet Take 1 tablet (  8 mg total) by mouth every 8 (eight) hours as needed for nausea or vomiting. Start on the third day after chemotherapy. 30 tablet 1   pantoprazole  (PROTONIX ) 40 MG tablet Take 1 tablet (40 mg total) by mouth daily. 30 tablet 0   pramipexole  (MIRAPEX ) 0.75 MG tablet Take 1 tablet (0.75 mg total) by mouth 3 (three) times daily. 90 tablet 0   prochlorperazine  (COMPAZINE ) 10 MG tablet Take 1 tablet (10 mg total) by mouth every 6 (six) hours as needed for nausea or vomiting. 30 tablet 1   tamsulosin  (FLOMAX ) 0.4 MG CAPS capsule Take 0.4 mg by mouth daily.     ticagrelor (BRILINTA) 90 MG TABS tablet Take 1 tablet (90 mg total) by mouth 2 (two) times daily. 60 tablet 0   traMADol  (ULTRAM ) 50 MG tablet Take 50 mg by mouth at bedtime as needed for severe pain (pain score 7-10).     traZODone  (DESYREL ) 100 MG tablet Take 1 tablet (100 mg total) by mouth at bedtime. (Patient taking differently: Take 50 mg by mouth at bedtime.) 30 tablet 0   triamcinolone (KENALOG) 0.1 % paste Use as directed 1 Application in the mouth or throat daily as needed (for mouth ulcers).     No current facility-administered medications for this visit.   Facility-Administered Medications Ordered in Other Visits  Medication Dose Route Frequency Provider Last Rate Last Admin   magnesium  sulfate 2 GM/50ML IVPB             REVIEW OF SYSTEMS:   Constitutional: Denies fevers, chills or abnormal night sweats Eyes: Denies blurriness of vision, double vision or watery eyes Ears, nose, mouth, throat, and face: Denies mucositis or sore throat Respiratory: Denies cough, dyspnea or wheezes Cardiovascular: Denies palpitation, chest discomfort or lower extremity swelling Gastrointestinal:  Denies nausea, heartburn or change in bowel  habits Skin: Denies abnormal skin rashes Lymphatics: Denies new lymphadenopathy or easy bruising Neurological:Denies numbness, tingling or new weaknesses Behavioral/Psych: Mood is stable, no new changes  All other systems were reviewed with the patient and are negative.  PHYSICAL EXAMINATION: ECOG PERFORMANCE STATUS: 2 - Symptomatic, <50% confined to bed  Vitals:   07/22/24 1105  BP: 126/79  Pulse: 77  Resp: 20  Temp: (!) 97.5 F (36.4 C)  SpO2: 97%   Filed Weights   07/22/24 1105  Weight: 257 lb 4.8 oz (116.7 kg)    GENERAL:alert, no distress and comfortable SKIN: skin color, texture, turgor are normal, no rashes or significant lesions EYES: normal, conjunctiva are pink and non-injected, sclera clear OROPHARYNX:no exudate, no erythema and lips, buccal mucosa, and tongue normal  NECK: supple, thyroid  normal size, non-tender, without nodularity LYMPH:  no palpable lymphadenopathy in the cervical, axillary or inguinal LUNGS: clear to auscultation and percussion with normal breathing effort HEART: regular rate & rhythm and no murmurs and no lower extremity edema ABDOMEN:abdomen soft, non-tender and normal bowel sounds Musculoskeletal:no cyanosis of digits and no clubbing  PSYCH: alert & oriented x 3 with fluent speech NEURO: no focal motor/sensory deficits  Physical Exam ABDOMEN: Abdomen non-tender in lower region. Right side of abdomen slightly tender. Left side of abdomen non-tender.  LABORATORY DATA:  I have reviewed the data as listed    Latest Ref Rng & Units 07/17/2024    6:37 AM 07/16/2024    8:17 PM 07/15/2024    8:39 AM  CBC  WBC 4.0 - 10.5 K/uL 3.4  4.9  5.9   Hemoglobin 13.0 - 17.0 g/dL 11.1  12.0  10.2   Hematocrit 39.0 - 52.0 % 36.2  39.1  32.9   Platelets 150 - 400 K/uL 178  203  183     @cmpl @  RADIOGRAPHIC STUDIES: I have personally reviewed the radiological images as listed and agreed with the findings in the report. DG Chest 2 View Result Date:  07/16/2024 CLINICAL DATA:  Chest pain. EXAM: CHEST - 2 VIEW COMPARISON:  July 03, 2024 FINDINGS: There is stable right-sided venous Port-A-Cath positioning. The heart size and mediastinal contours are within normal limits. Mild atelectasis and/or infiltrate is seen within the left lung base. This is decreased in severity when compared to the prior study. There is a small, stable left pleural effusion. No pneumothorax is identified. The visualized skeletal structures are unremarkable. IMPRESSION: 1. Mild left basilar atelectasis and/or infiltrate, decreased in severity when compared to the prior study. 2. Small, stable left pleural effusion. Electronically Signed   By: Suzen Dials M.D.   On: 07/16/2024 20:43   VAS US  UPPER EXTREMITY VENOUS DUPLEX Result Date: 07/07/2024 UPPER VENOUS STUDY  Patient Name:  XZAYVION VAETH  Date of Exam:   07/06/2024 Medical Rec #: [id number]        Accession #:    [id number] Date of Birth: 1956-05-05        Patient Gender: M Patient Age:   12 years Exam Location:  [facility] Procedure:      VAS US  UPPER EXTREMITY VENOUS DUPLEX Referring Phys: DUFFY AL-SULTANI --------------------------------------------------------------------------------  Indications: Pain Risk Factors: Trauma. Limitations: Poor ultrasound/tissue interface. Comparison Study: No prior studies. Performing Technologist: Cordella Collet RVT  Examination Guidelines: A complete evaluation includes B-mode imaging, spectral Doppler, color Doppler, and power Doppler as needed of all accessible portions of each vessel. Bilateral testing is considered an integral part of a complete examination. Limited examinations for reoccurring indications may be performed as noted.  Right Findings: +----------+------------+---------+-----------+----------+-------+ RIGHT     CompressiblePhasicitySpontaneousPropertiesSummary +----------+------------+---------+-----------+----------+-------+ IJV           Full        Yes       Yes                      +----------+------------+---------+-----------+----------+-------+ Subclavian               Yes       Yes                      +----------+------------+---------+-----------+----------+-------+ Axillary      Full       Yes       Yes                      +----------+------------+---------+-----------+----------+-------+ Brachial      Full                                          +----------+------------+---------+-----------+----------+-------+ Radial        Full                                          +----------+------------+---------+-----------+----------+-------+ Ulnar         Full                                          +----------+------------+---------+-----------+----------+-------+  Cephalic      Full                                          +----------+------------+---------+-----------+----------+-------+ Basilic       Full                                          +----------+------------+---------+-----------+----------+-------+  Left Findings: +----------+------------+---------+-----------+----------+-------+ LEFT      CompressiblePhasicitySpontaneousPropertiesSummary +----------+------------+---------+-----------+----------+-------+ Subclavian               Yes       Yes                      +----------+------------+---------+-----------+----------+-------+  Summary:  Right: No evidence of deep vein thrombosis in the upper extremity. No evidence of superficial vein thrombosis in the upper extremity.  Left: No evidence of thrombosis in the subclavian.  *See table(s) above for measurements and observations.  Diagnosing physician: Gaile New MD Electronically signed by Gaile New MD on 07/07/2024 at 10:25:45 AM.    Final    CARDIAC CATHETERIZATION Result Date: 07/04/2024   Mid Cx to Dist Cx lesion is 80% stenosed.   2nd Diag lesion is 95% stenosed.   A stent was successfully placed.   Post  intervention, there is a 0% residual stenosis. 1.  High-grade distal left circumflex lesion treated with one 3.5 x 16 mm Synergy XD stent postdilated to 3.75 mm. 2.  LVEDP of 20 mmHg. Recommendation: Dual antiplatelet therapy for at least 6 months but preferably 1 year with aspirin  and Brilinta followed by Brilinta monotherapy indefinitely.   ECHOCARDIOGRAM COMPLETE Result Date: 07/03/2024    ECHOCARDIOGRAM REPORT   Patient Name:   THEODOR MUSTIN Date of Exam: 07/03/2024 Medical Rec #:  [id number]       Height:       72.0 in Accession #:    [id number]      Weight:       272.9 lb Date of Birth:  11/06/1955       BSA:          2.432 m Patient Age:    68 years        BP:           104/70 mmHg Patient Gender: M               HR:           82 bpm. Exam Location:  [facility] Procedure: 2D Echo, Cardiac Doppler and Color Doppler (Both Spectral and Color            Flow Doppler were utilized during procedure). Indications:    Chest Pain R07.9  History:        Patient has prior history of Echocardiogram examinations, most                 recent 04/25/2023. CAD; Risk Factors:Hypertension and Sleep                 Apnea. H/O Hyperlipidemia, Dyspnea on exertion.  Sonographer:    BERNARDA ROCKS Referring Phys: [id number] SCOTESIA Y DUNLAP IMPRESSIONS  1. Left ventricular ejection fraction, by estimation, is 60 to 65%. The left ventricle has normal function. The left ventricle has no regional wall motion abnormalities.  Left ventricular diastolic parameters are consistent with Grade I diastolic dysfunction (impaired relaxation).  2. Right ventricular systolic function is normal. The right ventricular size is normal.  3. The mitral valve is normal in structure. No evidence of mitral valve regurgitation. No evidence of mitral stenosis.  4. The aortic valve was not well visualized. Aortic valve regurgitation is not visualized. No aortic stenosis is present.  5. Aortic dilatation noted. There is mild dilatation of the aortic root,  measuring 41 mm.  6. The inferior vena cava is normal in size with greater than 50% respiratory variability, suggesting right atrial pressure of 3 mmHg. Comparison(s): No significant change from prior study. FINDINGS  Left Ventricle: Left ventricular ejection fraction, by estimation, is 60 to 65%. The left ventricle has normal function. The left ventricle has no regional wall motion abnormalities. Strain was performed and the global longitudinal strain is indeterminate. The left ventricular internal cavity size was normal in size. There is no left ventricular hypertrophy. Left ventricular diastolic parameters are consistent with Grade I diastolic dysfunction (impaired relaxation). Normal left ventricular filling pressure. Right Ventricle: The right ventricular size is normal. No increase in right ventricular wall thickness. Right ventricular systolic function is normal. Left Atrium: Left atrial size was normal in size. Right Atrium: Right atrial size was normal in size. Pericardium: There is no evidence of pericardial effusion. Mitral Valve: The mitral valve is normal in structure. No evidence of mitral valve regurgitation. No evidence of mitral valve stenosis. MV peak gradient, 2.6 mmHg. The mean mitral valve gradient is 2.0 mmHg. Tricuspid Valve: The tricuspid valve is normal in structure. Tricuspid valve regurgitation is not demonstrated. No evidence of tricuspid stenosis. Aortic Valve: The aortic valve was not well visualized. Aortic valve regurgitation is not visualized. No aortic stenosis is present. Aortic valve mean gradient measures 3.0 mmHg. Aortic valve peak gradient measures 7.3 mmHg. Aortic valve area, by VTI measures 3.41 cm. Pulmonic Valve: The pulmonic valve was not well visualized. Pulmonic valve regurgitation is trivial. No evidence of pulmonic stenosis. Aorta: Aortic dilatation noted. There is mild dilatation of the aortic root, measuring 41 mm. Venous: The inferior vena cava is normal in size  with greater than 50% respiratory variability, suggesting right atrial pressure of 3 mmHg. IAS/Shunts: No atrial level shunt detected by color flow Doppler. Additional Comments: 3D was performed not requiring image post processing on an independent workstation and was indeterminate.  LEFT VENTRICLE PLAX 2D LVIDd:         4.80 cm      Diastology LVIDs:         3.20 cm      LV e' medial:    7.18 cm/s LV PW:         0.90 cm      LV E/e' medial:  8.3 LV IVS:        1.00 cm      LV e' lateral:   7.07 cm/s LVOT diam:     2.20 cm      LV E/e' lateral: 8.4 LV SV:         69 LV SV Index:   28 LVOT Area:     3.80 cm  LV Volumes (MOD) LV vol d, MOD A2C: 72.4 ml LV vol d, MOD A4C: 102.0 ml LV vol s, MOD A2C: 24.2 ml LV vol s, MOD A4C: 40.1 ml LV SV MOD A2C:     48.2 ml LV SV MOD A4C:     102.0 ml LV  SV MOD BP:      55.3 ml RIGHT VENTRICLE             IVC RV Basal diam:  3.50 cm     IVC diam: 2.00 cm RV S prime:     13.50 cm/s TAPSE (M-mode): 2.2 cm LEFT ATRIUM             Index        RIGHT ATRIUM           Index LA diam:        3.40 cm 1.40 cm/m   RA Area:     13.20 cm LA Vol (A2C):   21.1 ml 8.68 ml/m   RA Volume:   29.40 ml  12.09 ml/m LA Vol (A4C):   28.8 ml 11.84 ml/m LA Biplane Vol: 25.5 ml 10.49 ml/m  AORTIC VALVE                    PULMONIC VALVE AV Area (Vmax):    2.80 cm     PV Vmax:       1.09 m/s AV Area (Vmean):   2.87 cm     PV Peak grad:  4.8 mmHg AV Area (VTI):     3.41 cm AV Vmax:           135.00 cm/s AV Vmean:          82.600 cm/s AV VTI:            0.202 m AV Peak Grad:      7.3 mmHg AV Mean Grad:      3.0 mmHg LVOT Vmax:         99.60 cm/s LVOT Vmean:        62.300 cm/s LVOT VTI:          0.181 m LVOT/AV VTI ratio: 0.90  AORTA Ao Root diam: 4.10 cm Ao Asc diam:  3.60 cm MITRAL VALVE MV Area (PHT): 3.81 cm    SHUNTS MV Area VTI:   2.78 cm    Systemic VTI:  0.18 m MV Peak grad:  2.6 mmHg    Systemic Diam: 2.20 cm MV Mean grad:  2.0 mmHg MV Vmax:       0.80 m/s MV Vmean:      60.0 cm/s MV Decel  Time: 199 msec MV E velocity: 59.70 cm/s MV A velocity: 54.00 cm/s MV E/A ratio:  1.11 Vishnu Priya Mallipeddi Electronically signed by Diannah Late Mallipeddi Signature Date/Time: 07/03/2024/3:22:31 PM    Final    CT Angio Chest PE W and/or Wo Contrast Result Date: 07/03/2024 EXAM: CTA of the Chest with contrast for PE 07/03/2024 04:45:48 AM TECHNIQUE: CTA of the chest was performed without and with the administration of intravenous contrast. 75 mL of iohexol  (OMNIPAQUE ) 350 MG/ML injection. Multiplanar reformatted images are provided for review. MIP images are provided for review. Automated exposure control, iterative reconstruction, and/or weight based adjustment of the mA/kV was utilized to reduce the radiation dose to as low as reasonably achievable. COMPARISON: 05/15/2024 CLINICAL HISTORY: Pulmonary embolism (PE) suspected, high prob. Cc center cp 3 hours Nausea no vomiting. Stabbing pain increased from 5 to 10/10 170/110 HR 78 FINDINGS: PULMONARY ARTERIES: Pulmonary arteries are adequately opacified for evaluation. No pulmonary embolism. Main pulmonary artery is normal in caliber. MEDIASTINUM: The heart and pericardium demonstrate coronary artery calcifications. Aortic atherosclerosis is present. LYMPH NODES: No mediastinal, hilar or axillary lymphadenopathy. LUNGS AND PLEURA: Atelectasis and subpleural consolidation change. New  small left pleural effusion with overlying mild pleural thickening overlying the posterior right base. No pneumothorax. UPPER ABDOMEN: Corresponding to known malignancy suboptimally visualized reflecting phase of contrast enhancement. Index lesion within the anterior left lobe of liver measures approximately 1.8 x 1.5 cm image 114/4. Previously 1.5 x 1.0 cm. Peritoneal soft tissue nodularity is identified compared peritoneal thickening within the left upper quadrant of the abdomen with adjacent nodularity compatible with known peritoneal carcinomatosis. SOFT TISSUES AND BONES: No  acute or suspicious osseous findings. No acute soft tissue abnormality. IMPRESSION: 1. No evidence of pulmonary embolism. 2. New small left pleural effusion with overlying mild pleural thickening, consolidation and atelectasis. 3. Multifocal hypodense liver lesions compatible with known malignancy. Suboptimally visualized on the current exam due to phase of contrast opacification. 4. Peritoneal soft tissue nodularity compatible with peritoneal carcinomatosis. 5. Aortic atherosclerosis and coronary artery calcifications. Electronically signed by: Waddell Calk MD 07/03/2024 05:14 AM EDT RP Workstation: [id number]   DG Chest Portable 1 View Result Date: 07/03/2024 EXAM: 1 VIEW(S) XRAY OF THE CHEST 07/03/2024 12:27:00 AM COMPARISON: 06/01/2023 CLINICAL HISTORY: eval for pneumonia/chest pain. eval for pneumonia/chest pain - slight dizziness upon standing, mid AP region chest pressure, slight cough FINDINGS: LINES, TUBES AND DEVICES: Right chest port with tip in SVC. LUNGS AND PLEURA: Left basilar opacities. New small left pleural effusion. No pulmonary edema. No pneumothorax. HEART AND MEDIASTINUM: No acute abnormality of the cardiac and mediastinal silhouettes. BONES AND SOFT TISSUES: No acute osseous abnormality. IMPRESSION: 1. New small left pleural effusion with left basilar atelectasis or pneumonia. 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 Progression noted earlier this year, leading to a switch to FOLFOX regimen. Ascites managed with paracentesis, last performed on October 3rd, 2025.  - I discussed the incurable nature of his cancer, and the goal of therapy is palliative to prolong his life and improve his quality of life.  Current treatment aims to control disease progression and  manage complications such as ascites. Emphasized balancing quality of life with treatment efficacy. - His NGS Caris was done in 2023 which was negative for targeted therapy or immunotherapy.    Discussed potential for genetic testing to identify new mutations for targeted therapy.  - Continue FOLFOX regimen with hospital admission for next cycle due to coronary artery spasm from 5-FU. - Will coordinate with cardiologist for hospital admission during chemotherapy. - Will order genetic testing using ascitic fluid for potential new mutations. - Monitor ascites and consider paracentesis if symptoms worsen.  Chemotherapy-induced chest pain (vasospasm) during FOLFOX regimen Chest pain associated with FOLFOX regimen, likely due to vasospasm. Occurred during first and second cycles, managed with nitroglycerin  and oxycodone . Discussed protocol for managing vasospasm during chemotherapy, including hospital admission and premedication with nitroglycerin  and calcium  channel blockers. Emphasized importance of cardiology involvement to prevent severe complications. - Will admit to hospital for next FOLFOX cycle with cardiology involvement. - Will premedicate with nitroglycerin  and calcium  channel blockers during chemotherapy. - Will monitor for chest pain and manage with nitroglycerin  as needed.  Persistent hiccups after chemotherapy Persistent hiccups since November 5th, 2025, possibly related to dexamethasone  use. Discussed potential causes including nerve irritation and steroid use. Baclofen prescribed as a muscle relaxant to manage hiccups. - Prescribed baclofen 20 tablets, take up to three times daily as needed. - Advised against driving after taking  baclofen.  Gastroesophageal reflux disease Managed with pantoprazole , Pepcid , and Tums. Symptoms include heartburn and burning sensation in the chest. Discussed lifestyle modifications to manage symptoms. - Continue pantoprazole  and Pepcid . - Advised on  lifestyle modifications: avoid lying down after eating, avoid trigger foods like chocolate.  Coronary artery disease with stent placement Coronary artery disease with recent stent placement due to 80% blockage. Managed with Brilinta and metoprolol . Discussed coordination with cardiologist for chemotherapy management. - Continue Brilinta and metoprolol . - Coordinated with cardiologist for chemotherapy management.  Hypertension Managed with metoprolol  and other antihypertensive medications. - Continue current antihypertensive regimen.  Type 2 diabetes mellitus Managed with metformin  and Farxiga. - Continue metformin  and Farxiga.  Plan - I personally reviewed his chart, including images in detail - I agree with current therapy, but I am concerned the probable coronary artery spasm from 5-FU.  Will reach out to cardiologist to see if we can admit him to [facility] for next cycle chemo with cardiac meds on board. - Genetic referral - Will obtain cytology for next paracentesis, and send for NGS again to see if he has new targeted therapy options -I called in hydroxyzine 25 mg 3 times a day as needed for hiccups - I will see him as needed in future.   Orders Placed This Encounter  Procedures   Ambulatory referral to Genetics    Referral Priority:   Routine    Referral Type:   Consultation    Referral Reason:   Specialty Services Required    Number of Visits Requested:   1    All questions were answered. The patient knows to call the clinic with any problems, questions or concerns. I spent 50 minutes counseling the patient face to face. The total time spent in the appointment was 60 minutes including review of chart and various tests results, discussions about plan of care and coordination of care plan.     Onita Mattock, MD 07/22/2024 4:33 PM

## 2024-07-28 ENCOUNTER — Other Ambulatory Visit: Payer: Self-pay | Admitting: *Deleted

## 2024-07-28 ENCOUNTER — Encounter: Payer: Self-pay | Admitting: Hematology

## 2024-07-28 ENCOUNTER — Telehealth (HOSPITAL_COMMUNITY): Payer: Self-pay | Admitting: Cardiology

## 2024-07-28 ENCOUNTER — Other Ambulatory Visit: Payer: Self-pay | Admitting: Hematology

## 2024-07-28 DIAGNOSIS — Z95828 Presence of other vascular implants and grafts: Secondary | ICD-10-CM

## 2024-07-28 DIAGNOSIS — C221 Intrahepatic bile duct carcinoma: Secondary | ICD-10-CM

## 2024-07-28 MED ORDER — METOCLOPRAMIDE HCL 10 MG PO TABS: 10.0000 mg | ORAL_TABLET | Freq: Three times a day (TID) | ORAL | 0 refills | Status: DC | PRN

## 2024-07-28 MED ORDER — CHLORPROMAZINE HCL 25 MG PO TABS: 25.0000 mg | ORAL_TABLET | Freq: Three times a day (TID) | ORAL | 0 refills | Status: DC | PRN

## 2024-07-28 MED ORDER — ONDANSETRON HCL 8 MG PO TABS: 8.0000 mg | ORAL_TABLET | Freq: Three times a day (TID) | ORAL | 1 refills | Status: DC | PRN

## 2024-07-28 MED ORDER — PROCHLORPERAZINE MALEATE 10 MG PO TABS: 10.0000 mg | ORAL_TABLET | Freq: Four times a day (QID) | ORAL | 1 refills | Status: DC | PRN

## 2024-07-28 NOTE — Telephone Encounter (Signed)
 Called to confirm/remind patient of their appointment at the [facility] on 07/28/24.   Appointment:   [x] Confirmed  [] Left mess   [] No answer/No voice mail  [] VM Full/unable to leave message  [] Phone not in service  Patient reminded to bring all medications and/or complete list.  Confirmed patient has transportation. Gave directions, instructed to utilize valet parking.

## 2024-07-29 ENCOUNTER — Inpatient Hospital Stay

## 2024-07-31 ENCOUNTER — Inpatient Hospital Stay

## 2024-08-04 ENCOUNTER — Encounter: Payer: Self-pay | Admitting: Oncology

## 2024-08-04 ENCOUNTER — Ambulatory Visit (HOSPITAL_COMMUNITY)
Admission: RE | Admit: 2024-08-04 | Discharge: 2024-08-04 | Disposition: A | Discharge status: Home / Self Care | Source: Ambulatory Visit | Attending: Cardiology | Admitting: Cardiology

## 2024-08-04 ENCOUNTER — Encounter (HOSPITAL_COMMUNITY): Payer: Self-pay | Admitting: Cardiology

## 2024-08-04 ENCOUNTER — Other Ambulatory Visit: Payer: Self-pay | Admitting: *Deleted

## 2024-08-04 VITALS — BP 112/70 | HR 90 | Wt 263.6 lb

## 2024-08-04 DIAGNOSIS — Z7969 Long term (current) use of other immunomodulators and immunosuppressants: Secondary | ICD-10-CM | POA: Diagnosis not present

## 2024-08-04 DIAGNOSIS — C787 Secondary malignant neoplasm of liver and intrahepatic bile duct: Secondary | ICD-10-CM

## 2024-08-04 DIAGNOSIS — E785 Hyperlipidemia, unspecified: Secondary | ICD-10-CM

## 2024-08-04 DIAGNOSIS — C221 Intrahepatic bile duct carcinoma: Secondary | ICD-10-CM

## 2024-08-04 DIAGNOSIS — Z955 Presence of coronary angioplasty implant and graft: Secondary | ICD-10-CM | POA: Insufficient documentation

## 2024-08-04 DIAGNOSIS — I2510 Atherosclerotic heart disease of native coronary artery without angina pectoris: Secondary | ICD-10-CM | POA: Insufficient documentation

## 2024-08-04 DIAGNOSIS — R0789 Other chest pain: Secondary | ICD-10-CM | POA: Insufficient documentation

## 2024-08-04 DIAGNOSIS — C240 Malignant neoplasm of extrahepatic bile duct: Secondary | ICD-10-CM | POA: Insufficient documentation

## 2024-08-04 DIAGNOSIS — C786 Secondary malignant neoplasm of retroperitoneum and peritoneum: Secondary | ICD-10-CM | POA: Diagnosis not present

## 2024-08-04 DIAGNOSIS — I10 Essential (primary) hypertension: Secondary | ICD-10-CM | POA: Diagnosis not present

## 2024-08-04 DIAGNOSIS — I5032 Chronic diastolic (congestive) heart failure: Secondary | ICD-10-CM | POA: Insufficient documentation

## 2024-08-04 DIAGNOSIS — Z95828 Presence of other vascular implants and grafts: Secondary | ICD-10-CM

## 2024-08-04 MED ORDER — DEXAMETHASONE 4 MG PO TABS: 8.0000 mg | ORAL_TABLET | Freq: Every day | ORAL | 1 refills | Status: DC

## 2024-08-04 MED ORDER — FUROSEMIDE 20 MG PO TABS: 20.0000 mg | ORAL_TABLET | ORAL | 3 refills | Status: DC

## 2024-08-04 MED ORDER — NIFEDIPINE ER OSMOTIC RELEASE 30 MG PO TB24: 30.0000 mg | ORAL_TABLET | Freq: Every day | ORAL | 3 refills | Status: DC

## 2024-08-04 NOTE — Patient Instructions (Signed)
 STOP Metoprolol    START Lasix  20 mg every Monday, Wednesday and Friday.  START Nifeidipine 30 mg daily.  Your physician recommends that you schedule a follow-up appointment in: 3 months ( February 2026) ** PLEASE CALL THE OFFICE IN DECEMBER TO ARRANGE YOUR FOLLOW UP APPOINTMENT.**  If you have any questions or concerns before your next appointment please send us  a message through [place] or call our office at [phone].    TO LEAVE A MESSAGE FOR THE NURSE SELECT OPTION 2, PLEASE LEAVE A MESSAGE INCLUDING: YOUR NAME DATE OF BIRTH CALL BACK NUMBER REASON FOR CALL**this is important as we prioritize the call backs  YOU WILL RECEIVE A CALL BACK THE SAME DAY AS LONG AS YOU CALL BEFORE 4:00 PM  At the [facility], you and your health needs are our priority. As part of our continuing mission to provide you with exceptional heart care, we have created designated Provider Care Teams. These Care Teams include your primary Cardiologist (physician) and Advanced Practice Providers (APPs- Physician Assistants and Nurse Practitioners) who all work together to provide you with the care you need, when you need it.   You may see any of the following providers on your designated Care Team at your next follow up: Dr Toribio Fuel Dr Ezra Shuck Dr. Morene Brownie Greig Mosses, NP Caffie Shed, [place] [facility], [place] Beckey Coe, NP Jordan Lee, NP Ellouise Class, NP Tinnie Redman, PharmD Jaun Bash, PharmD   Please be sure to bring in all your medications bottles to every appointment.    Thank you for choosing [facility]

## 2024-08-04 NOTE — Progress Notes (Signed)
 Cardio-Oncology Clinic Consult Note   Referring Physician: Dr. [name] Primary Care: Primary Cardiologist:  HPI:  Marvin Carr is a 68 y.o. male with past medical history of CAD s/p PCI to the Lcx, metastatic cholangiocarcinoma who has been referred by Dr. [name] to establish in the cardio-oncology clinic for monitoring of cardio-toxicity while undergoing chemotherapy.        Oncology History  Cholangiocarcinoma metastatic to liver (HCC)  09/26/2021 Initial Diagnosis   Cholangiocarcinoma metastatic to liver (HCC)   10/05/2021 - 05/03/2022 Chemotherapy   Patient is on Treatment Plan : MYELOMA MAINTENANCE Bortezomib SQ q14d     10/05/2021 - 11/07/2023 Chemotherapy   Patient is on Treatment Plan : BILIARY TRACT Cisplatin  + Gemcitabine  + Imfinzi  D1,8 q21d/Imfinzi  Maintenance     12/07/2023 - 06/10/2024 Chemotherapy   Patient is on Treatment Plan : BILIARY TRACT Cisplatin  + Gemcitabine  D1,15 + Durvalumab  (1500) D1 q28d / Durvalumab  (1500) q28d     07/01/2024 -  Chemotherapy   Patient is on Treatment Plan : PANCREAS FOLFOX q14d           Patient was diagnosed with metastatic cholangiocarcinoma in 2022 with cancer of the bile duct, liver, and peritoneum.  Initially treated with cisplatin , gemcitabine , and durvalumab  for 6 months.  Had eventual disease progression in 2025, treatment delayed by hip replacement in May 2025.  He was transition to FOLFOX 06/2024.[name]  Patient reports that during his first 5-FU infusion he developed crushing chest pain around 4 AM in the morning.  Came to the hospital, was found to have mildly elevated but uptrending troponin, underwent left heart catheterization showing focal left circumflex lesion, though did not appear acute by my evaluation.  During his subsequent 5-FU infusion he developed recurrent chest pain.  At that time, biomarkers were flat.  Chest pain improved with cessation of infusion and medical therapy.  Denies no recent or recurring chest pain  outside of his time on his infusion, he does note some occasional bendopnea as well as shortness of breath with exertion.  He also notes that his abdomen is more distended than previous.  Last had a paracentesis a few months ago with 4.2 L removed.  Medical history and current medications were reviewed in Epic as part of clinic visit.   PHYSICAL EXAM: Vitals:   08/04/24 0941  BP: 112/70  Pulse: 90  SpO2: 96%   GENERAL: Fair appearing PULM:  Normal work of breathing, clear to auscultation bilaterally. Respirations are unlabored.  CARDIAC:  JVP: Flat         Normal rate with regular rhythm. No murmurs, rubs or gallops.  Trace edema.  ABDOMEN: Mildly distended NEUROLOGIC: Patient is oriented x3 with no focal or lateralizing neurologic deficits.  PSYCH: Patients affect is appropriate, there is no evidence of anxiety or depression.  SKIN: Warm and dry; no lesions or wounds. Warm and well perfused extremities.    ASSESSMENT & PLAN:  1.  Cardio oncology: Description and timing of chest pain are concerning for 5-FU induced vasospasm.  While he did have significant coronary disease noted on left heart catheterization with resulting PCI, the lesion does not appear acute and may have been incidental or worsened in the setting of vasospasm.  Given recurrence of chest pain with multiple continuous dose infusions, would recommend transition to bolus 5-FU dosing per prior JACC 2020 paper that has guided our therapy in the past.   While metoprolol  is a very reasonable choice for angina and CAD, given suspect vasospasm and borderline  BP will transition to nifedipine  today.  - Stop home metoprolol  - Start nifedipine  30 mg daily - Discussed transition to bolus 5-FU dosing with oncology - Scheduled for infusion tomorrow - Instructed to bring nitroglycerin  tablets and take 1 prior to infusion - Eventual transition to Imdur as well blood pressure tolerating  Heart failure with preserved EF: Noted  diastolic dysfunction on most recent echo 06/2024 as well as symptoms of orthopnea and bendopnea.  While some of this may be due to abdominal ascites, reasonable to start on low-dose diuretics for symptomatic management. - Start Lasix  20 mg 3 times weekly - Continue Farxiga  10 mg daily - Hopefully can start on spironolactone  in the future - Repeat abdominal imaging and potential paracentesis per oncology  Explained incidence of 5-FU cardiotoxicity and role of Cardio-oncology clinic at length. Echo and previous cardiac catheterization images reviewed personally. All parameters stable. Reviewed signs and symptoms of HF to look for. Ok to continue chemotherapy, though would avoid continuous infusion.  I spent 62 minutes caring for this patient today including face to face time, ordering and reviewing labs, reviewing records from recent cardiac admissions, discussion with patient's oncologist, reviewing cardiac catheterization images, seeing the patient, documenting in the record, and arranging follow ups.    Morene Brownie, MD Advanced [facility] Mechanical Circulatory Support Cardio-Oncology 08/05/24

## 2024-08-05 ENCOUNTER — Other Ambulatory Visit: Payer: Self-pay | Admitting: *Deleted

## 2024-08-05 ENCOUNTER — Inpatient Hospital Stay

## 2024-08-05 ENCOUNTER — Other Ambulatory Visit: Payer: Self-pay | Admitting: Oncology

## 2024-08-05 VITALS — BP 115/65 | HR 83 | Temp 97.8°F | Resp 18 | Wt 261.6 lb

## 2024-08-05 DIAGNOSIS — Z5111 Encounter for antineoplastic chemotherapy: Secondary | ICD-10-CM | POA: Diagnosis not present

## 2024-08-05 DIAGNOSIS — C221 Intrahepatic bile duct carcinoma: Secondary | ICD-10-CM

## 2024-08-05 DIAGNOSIS — Z95828 Presence of other vascular implants and grafts: Secondary | ICD-10-CM

## 2024-08-05 LAB — CBC WITH DIFFERENTIAL/PLATELET
Abs Immature Granulocytes: 0.59 K/uL — ABNORMAL HIGH (ref 0.00–0.07)
Basophils Absolute: 0 K/uL (ref 0.0–0.1)
Basophils Relative: 0 %
Eosinophils Absolute: 0.1 K/uL (ref 0.0–0.5)
Eosinophils Relative: 1 %
HCT: 29.9 % — ABNORMAL LOW (ref 39.0–52.0)
Hemoglobin: 9.5 g/dL — ABNORMAL LOW (ref 13.0–17.0)
Immature Granulocytes: 10 %
Lymphocytes Relative: 14 %
Lymphs Abs: 0.8 K/uL (ref 0.7–4.0)
MCH: 30.6 pg (ref 26.0–34.0)
MCHC: 31.8 g/dL (ref 30.0–36.0)
MCV: 96.5 fL (ref 80.0–100.0)
Monocytes Absolute: 0.8 K/uL (ref 0.1–1.0)
Monocytes Relative: 14 %
Neutro Abs: 3.5 K/uL (ref 1.7–7.7)
Neutrophils Relative %: 61 %
Platelets: 148 K/uL — ABNORMAL LOW (ref 150–400)
RBC: 3.1 MIL/uL — ABNORMAL LOW (ref 4.22–5.81)
RDW: 18.8 % — ABNORMAL HIGH (ref 11.5–15.5)
Smear Review: NORMAL
WBC: 5.7 K/uL (ref 4.0–10.5)
nRBC: 2.3 % — ABNORMAL HIGH (ref 0.0–0.2)

## 2024-08-05 LAB — COMPREHENSIVE METABOLIC PANEL WITH GFR
ALT: 21 U/L (ref 0–44)
AST: 32 U/L (ref 15–41)
Albumin: 3.2 g/dL — ABNORMAL LOW (ref 3.5–5.0)
Alkaline Phosphatase: 100 U/L (ref 38–126)
Anion gap: 9 (ref 5–15)
BUN: 15 mg/dL (ref 8–23)
CO2: 26 mmol/L (ref 22–32)
Calcium: 8.5 mg/dL — ABNORMAL LOW (ref 8.9–10.3)
Chloride: 100 mmol/L (ref 98–111)
Creatinine, Ser: 0.98 mg/dL (ref 0.61–1.24)
GFR, Estimated: >60 mL/min (ref >60)
Glucose, Bld: 155 mg/dL — ABNORMAL HIGH (ref 70–99)
Potassium: 3.6 mmol/L (ref 3.5–5.1)
Sodium: 135 mmol/L (ref 135–145)
Total Bilirubin: 0.3 mg/dL (ref 0.0–1.2)
Total Protein: 5.5 g/dL — ABNORMAL LOW (ref 6.5–8.1)

## 2024-08-05 LAB — MAGNESIUM: Magnesium: 1.5 mg/dL — ABNORMAL LOW (ref 1.7–2.4)

## 2024-08-05 MED ORDER — MAGNESIUM SULFATE 2 GM/50ML IV SOLN
2.0000 g | Freq: Once | INTRAVENOUS | Status: AC
  Administered 2024-08-05: 2 g via INTRAVENOUS
  Filled 2024-08-05: qty 50

## 2024-08-05 MED ORDER — OXALIPLATIN CHEMO INJECTION 100 MG/20ML
85.0000 mg/m2 | Freq: Once | INTRAVENOUS | Status: AC
  Administered 2024-08-05: 200 mg via INTRAVENOUS
  Filled 2024-08-05: qty 40

## 2024-08-05 MED ORDER — PALONOSETRON HCL INJECTION 0.25 MG/5ML
0.2500 mg | Freq: Once | INTRAVENOUS | Status: AC
  Administered 2024-08-05: 0.25 mg via INTRAVENOUS
  Filled 2024-08-05: qty 5

## 2024-08-05 MED ORDER — DEXTROSE 5 % IV SOLN: INTRAVENOUS | Status: DC

## 2024-08-05 MED ORDER — DEXAMETHASONE SOD PHOSPHATE PF 10 MG/ML IJ SOLN
10.0000 mg | Freq: Once | INTRAMUSCULAR | Status: AC
  Administered 2024-08-05: 10 mg via INTRAVENOUS

## 2024-08-05 MED ORDER — FLUOROURACIL CHEMO INJECTION 2.5 GM/50ML
400.0000 mg/m2 | Freq: Once | INTRAVENOUS | Status: AC
  Administered 2024-08-05: 1000 mg via INTRAVENOUS
  Filled 2024-08-05: qty 20

## 2024-08-05 MED ORDER — LEUCOVORIN CALCIUM INJECTION 350 MG
394.0000 mg/m2 | Freq: Once | INTRAVENOUS | Status: AC
  Administered 2024-08-05: 1000 mg via INTRAVENOUS
  Filled 2024-08-05: qty 50

## 2024-08-05 NOTE — Progress Notes (Signed)
 5Fluoruracil pump DC'd from treatment plan per below due to chest pains.  Add Day 8 - 5FU 400 mg/m2 IVPush and Leucovorin  400 mg/m2 IVPB to each cycle with appropriate labs.  Niels Molt, PharmD

## 2024-08-05 NOTE — Progress Notes (Signed)
 Patient presents today for Oxaliplatin /Leucovorin /fluorouracil  push per providers order.  Vital signs and labs within parameters for treatment.  Patients only complaint is of a sore throat.  Magnesium  level noted to be 1.5, per MD standing order patient will receive 2 Grams IV Magnesium  with treatment.    Treatment given today per MD orders.  Stable during infusion without adverse affects.  Vital signs stable.  No complaints at this time.  Discharge from clinic ambulatory in stable condition.  Alert and oriented X 3.  Follow up with [facility] as scheduled.

## 2024-08-05 NOTE — Patient Instructions (Signed)
 [facility] [place] - A DEPT OF [facility]  Discharge Instructions: Thank you for choosing [facility] to provide your oncology and hematology care.  If you have a lab appointment with the Cancer Center - please note that after April 8th, 2024, all labs will be drawn in the cancer center.  You do not have to check in or register with the main entrance as you have in the past but will complete your check-in in the cancer center.  Wear comfortable clothing and clothing appropriate for easy access to any Portacath or PICC line.   We strive to give you quality time with your provider. You may need to reschedule your appointment if you arrive late (15 or more minutes).  Arriving late affects you and other patients whose appointments are after yours.  Also, if you miss three or more appointments without notifying the office, you may be dismissed from the clinic at the provider's discretion.      For prescription refill requests, have your pharmacy contact our office and allow 72 hours for refills to be completed.    Today you received the following chemotherapy and/or immunotherapy agents Oxaliplatin , Leucovorin /fluorouracil       To help prevent nausea and vomiting after your treatment, we encourage you to take your nausea medication as directed.  BELOW ARE SYMPTOMS THAT SHOULD BE REPORTED IMMEDIATELY: *FEVER GREATER THAN 100.4 F (38 C) OR HIGHER *CHILLS OR SWEATING *NAUSEA AND VOMITING THAT IS NOT CONTROLLED WITH YOUR NAUSEA MEDICATION *UNUSUAL SHORTNESS OF BREATH *UNUSUAL BRUISING OR BLEEDING *URINARY PROBLEMS (pain or burning when urinating, or frequent urination) *BOWEL PROBLEMS (unusual diarrhea, constipation, pain near the anus) TENDERNESS IN MOUTH AND THROAT WITH OR WITHOUT PRESENCE OF ULCERS (sore throat, sores in mouth, or a toothache) UNUSUAL RASH, SWELLING OR PAIN  UNUSUAL VAGINAL DISCHARGE OR ITCHING   Items with * indicate a potential  emergency and should be followed up as soon as possible or go to the Emergency Department if any problems should occur.  Please show the CHEMOTHERAPY ALERT CARD or IMMUNOTHERAPY ALERT CARD at check-in to the Emergency Department and triage nurse.  Should you have questions after your visit or need to cancel or reschedule your appointment, please contact [facility] [place] - A DEPT OF [facility] [phone]  and follow the prompts.  Office hours are 8:00 a.m. to 4:30 p.m. Monday - Friday. Please note that voicemails left after 4:00 p.m. may not be returned until the following business day.  We are closed weekends and major holidays. You have access to a nurse at all times for urgent questions. Please call the main number to the clinic [phone] and follow the prompts.  For any non-urgent questions, you may also contact your provider using MyChart. We now offer e-Visits for anyone 72 and older to request care online for non-urgent symptoms. For details visit [email or web address].   Also download the MyChart app! Go to the app store, search MyChart, open the app, select [facility], and log in with your MyChart username and password.

## 2024-08-06 ENCOUNTER — Other Ambulatory Visit (HOSPITAL_COMMUNITY): Payer: Self-pay

## 2024-08-06 LAB — CEA: CEA: 26.8 ng/mL — ABNORMAL HIGH (ref 0.0–4.7)

## 2024-08-06 LAB — CANCER ANTIGEN 19-9: CA 19-9: 839 U/mL — ABNORMAL HIGH (ref 0–35)

## 2024-08-12 ENCOUNTER — Inpatient Hospital Stay

## 2024-08-12 ENCOUNTER — Encounter: Payer: Self-pay | Admitting: Oncology

## 2024-08-12 ENCOUNTER — Inpatient Hospital Stay: Attending: Hematology

## 2024-08-12 ENCOUNTER — Inpatient Hospital Stay: Admitting: Oncology

## 2024-08-12 VITALS — BP 121/71 | HR 95 | Resp 18

## 2024-08-12 DIAGNOSIS — Z8551 Personal history of malignant neoplasm of bladder: Secondary | ICD-10-CM | POA: Diagnosis not present

## 2024-08-12 DIAGNOSIS — R180 Malignant ascites: Secondary | ICD-10-CM | POA: Insufficient documentation

## 2024-08-12 DIAGNOSIS — G4700 Insomnia, unspecified: Secondary | ICD-10-CM | POA: Diagnosis not present

## 2024-08-12 DIAGNOSIS — R053 Chronic cough: Secondary | ICD-10-CM | POA: Diagnosis not present

## 2024-08-12 DIAGNOSIS — E876 Hypokalemia: Secondary | ICD-10-CM | POA: Insufficient documentation

## 2024-08-12 DIAGNOSIS — Z87891 Personal history of nicotine dependence: Secondary | ICD-10-CM | POA: Insufficient documentation

## 2024-08-12 DIAGNOSIS — G2581 Restless legs syndrome: Secondary | ICD-10-CM | POA: Diagnosis not present

## 2024-08-12 DIAGNOSIS — T451X5D Adverse effect of antineoplastic and immunosuppressive drugs, subsequent encounter: Secondary | ICD-10-CM | POA: Diagnosis not present

## 2024-08-12 DIAGNOSIS — J90 Pleural effusion, not elsewhere classified: Secondary | ICD-10-CM | POA: Insufficient documentation

## 2024-08-12 DIAGNOSIS — Z95828 Presence of other vascular implants and grafts: Secondary | ICD-10-CM

## 2024-08-12 DIAGNOSIS — D509 Iron deficiency anemia, unspecified: Secondary | ICD-10-CM | POA: Diagnosis not present

## 2024-08-12 DIAGNOSIS — C221 Intrahepatic bile duct carcinoma: Secondary | ICD-10-CM | POA: Insufficient documentation

## 2024-08-12 DIAGNOSIS — G620 Drug-induced polyneuropathy: Secondary | ICD-10-CM | POA: Insufficient documentation

## 2024-08-12 DIAGNOSIS — Z96649 Presence of unspecified artificial hip joint: Secondary | ICD-10-CM | POA: Insufficient documentation

## 2024-08-12 DIAGNOSIS — Z79899 Other long term (current) drug therapy: Secondary | ICD-10-CM | POA: Diagnosis not present

## 2024-08-12 DIAGNOSIS — C787 Secondary malignant neoplasm of liver and intrahepatic bile duct: Secondary | ICD-10-CM

## 2024-08-12 DIAGNOSIS — R970 Elevated carcinoembryonic antigen [CEA]: Secondary | ICD-10-CM | POA: Insufficient documentation

## 2024-08-12 LAB — CBC WITH DIFFERENTIAL/PLATELET
Abs Immature Granulocytes: 0.13 K/uL — ABNORMAL HIGH (ref 0.00–0.07)
Basophils Absolute: 0 K/uL (ref 0.0–0.1)
Basophils Relative: 0 %
Eosinophils Absolute: 0 K/uL (ref 0.0–0.5)
Eosinophils Relative: 0 %
HCT: 30.5 % — ABNORMAL LOW (ref 39.0–52.0)
Hemoglobin: 9.6 g/dL — ABNORMAL LOW (ref 13.0–17.0)
Immature Granulocytes: 2 %
Lymphocytes Relative: 11 %
Lymphs Abs: 0.8 K/uL (ref 0.7–4.0)
MCH: 30.4 pg (ref 26.0–34.0)
MCHC: 31.5 g/dL (ref 30.0–36.0)
MCV: 96.5 fL (ref 80.0–100.0)
Monocytes Absolute: 0.9 K/uL (ref 0.1–1.0)
Monocytes Relative: 13 %
Neutro Abs: 5.2 K/uL (ref 1.7–7.7)
Neutrophils Relative %: 74 %
Platelets: 173 K/uL (ref 150–400)
RBC: 3.16 MIL/uL — ABNORMAL LOW (ref 4.22–5.81)
RDW: 17.9 % — ABNORMAL HIGH (ref 11.5–15.5)
WBC: 7.1 K/uL (ref 4.0–10.5)
nRBC: 1 % — ABNORMAL HIGH (ref 0.0–0.2)

## 2024-08-12 LAB — COMPREHENSIVE METABOLIC PANEL WITH GFR
ALT: 17 U/L (ref 0–44)
AST: 25 U/L (ref 15–41)
Albumin: 3.4 g/dL — ABNORMAL LOW (ref 3.5–5.0)
Alkaline Phosphatase: 90 U/L (ref 38–126)
Anion gap: 14 (ref 5–15)
BUN: 19 mg/dL (ref 8–23)
CO2: 24 mmol/L (ref 22–32)
Calcium: 8.6 mg/dL — ABNORMAL LOW (ref 8.9–10.3)
Chloride: 99 mmol/L (ref 98–111)
Creatinine, Ser: 1.17 mg/dL (ref 0.61–1.24)
GFR, Estimated: >60 mL/min (ref >60)
Glucose, Bld: 140 mg/dL — ABNORMAL HIGH (ref 70–99)
Potassium: 3.4 mmol/L — ABNORMAL LOW (ref 3.5–5.1)
Sodium: 137 mmol/L (ref 135–145)
Total Bilirubin: 0.3 mg/dL (ref 0.0–1.2)
Total Protein: 5.9 g/dL — ABNORMAL LOW (ref 6.5–8.1)

## 2024-08-12 LAB — MAGNESIUM: Magnesium: 1.4 mg/dL — ABNORMAL LOW (ref 1.7–2.4)

## 2024-08-12 MED ORDER — PALONOSETRON HCL INJECTION 0.25 MG/5ML
0.2500 mg | Freq: Once | INTRAVENOUS | Status: AC
  Administered 2024-08-12: 0.25 mg via INTRAVENOUS
  Filled 2024-08-12: qty 5

## 2024-08-12 MED ORDER — SODIUM CHLORIDE 0.9 % IV SOLN
394.0000 mg/m2 | Freq: Once | INTRAVENOUS | Status: AC
  Administered 2024-08-12: 1000 mg via INTRAVENOUS
  Filled 2024-08-12: qty 50

## 2024-08-12 MED ORDER — POTASSIUM CHLORIDE CRYS ER 20 MEQ PO TBCR
40.0000 meq | EXTENDED_RELEASE_TABLET | Freq: Once | ORAL | Status: AC
  Administered 2024-08-12: 40 meq via ORAL
  Filled 2024-08-12: qty 2

## 2024-08-12 MED ORDER — SODIUM CHLORIDE 0.9 % IV SOLN: Freq: Once | INTRAVENOUS | Status: AC

## 2024-08-12 MED ORDER — SODIUM CHLORIDE 0.9 % IV SOLN: INTRAVENOUS | Status: AC

## 2024-08-12 MED ORDER — SODIUM CHLORIDE 0.9% FLUSH: 10.0000 mL | Freq: Once | INTRAVENOUS | Status: AC

## 2024-08-12 MED ORDER — FLUOROURACIL CHEMO INJECTION 2.5 GM/50ML
400.0000 mg/m2 | Freq: Once | INTRAVENOUS | Status: DC
  Filled 2024-08-12: qty 20

## 2024-08-12 MED ORDER — MAGNESIUM SULFATE 4 GM/100ML IV SOLN
4.0000 g | Freq: Once | INTRAVENOUS | Status: AC
  Administered 2024-08-12: 4 g via INTRAVENOUS
  Filled 2024-08-12: qty 100

## 2024-08-12 MED ORDER — LOPERAMIDE HCL 2 MG PO CAPS
4.0000 mg | ORAL_CAPSULE | Freq: Once | ORAL | Status: AC
  Administered 2024-08-12: 4 mg via ORAL
  Filled 2024-08-12: qty 2

## 2024-08-12 MED ORDER — LOPERAMIDE HCL 2 MG PO CAPS: 2.0000 mg | ORAL_CAPSULE | ORAL | 3 refills | Status: DC | PRN

## 2024-08-12 NOTE — Progress Notes (Unsigned)
 Per Dr Davonna:  Place orders for:  K+ 3.4 - give Potassium chloride  40 meq orally x 1 Mag 1.4 - give Magnesium  sulfate 4 gm over 60 minutes  Infuse Leucovorin  today over 60 minutes.  NS 1 liter at 500 ml/hr x 1 liter  Niels Molt, PharmD Clinical Pharmacist

## 2024-08-13 ENCOUNTER — Inpatient Hospital Stay

## 2024-08-13 ENCOUNTER — Other Ambulatory Visit: Payer: Self-pay | Admitting: *Deleted

## 2024-08-13 ENCOUNTER — Encounter: Payer: Self-pay | Admitting: *Deleted

## 2024-08-13 ENCOUNTER — Encounter: Payer: Self-pay | Admitting: Oncology

## 2024-08-13 VITALS — BP 130/77 | HR 102 | Temp 97.9°F | Resp 20

## 2024-08-13 DIAGNOSIS — Z95828 Presence of other vascular implants and grafts: Secondary | ICD-10-CM

## 2024-08-13 DIAGNOSIS — C221 Intrahepatic bile duct carcinoma: Secondary | ICD-10-CM

## 2024-08-13 MED ORDER — SODIUM CHLORIDE 0.9 % IV SOLN: INTRAVENOUS | Status: DC

## 2024-08-13 MED ORDER — DIPHENOXYLATE-ATROPINE 2.5-0.025 MG PO TABS: 1.0000 | ORAL_TABLET | Freq: Four times a day (QID) | ORAL | 0 refills | Status: DC | PRN

## 2024-08-13 MED ORDER — FLUOROURACIL CHEMO INJECTION 2.5 GM/50ML
400.0000 mg/m2 | Freq: Once | INTRAVENOUS | Status: AC
  Administered 2024-08-13: 1000 mg via INTRAVENOUS
  Filled 2024-08-13: qty 20

## 2024-08-13 NOTE — Patient Instructions (Signed)
 [facility] [place] - A DEPT OF [facility]  Discharge Instructions: Thank you for choosing [facility] to provide your oncology and hematology care.  If you have a lab appointment with the Cancer Center - please note that after April 8th, 2024, all labs will be drawn in the cancer center.  You do not have to check in or register with the main entrance as you have in the past but will complete your check-in in the cancer center.  Wear comfortable clothing and clothing appropriate for easy access to any Portacath or PICC line.   We strive to give you quality time with your provider. You may need to reschedule your appointment if you arrive late (15 or more minutes).  Arriving late affects you and other patients whose appointments are after yours.  Also, if you miss three or more appointments without notifying the office, you may be dismissed from the clinic at the provider's discretion.      For prescription refill requests, have your pharmacy contact our office and allow 72 hours for refills to be completed.    Today you received the following chemotherapy and/or immunotherapy agents Adrucil  today   To help prevent nausea and vomiting after your treatment, we encourage you to take your nausea medication as directed.  BELOW ARE SYMPTOMS THAT SHOULD BE REPORTED IMMEDIATELY: *FEVER GREATER THAN 100.4 F (38 C) OR HIGHER *CHILLS OR SWEATING *NAUSEA AND VOMITING THAT IS NOT CONTROLLED WITH YOUR NAUSEA MEDICATION *UNUSUAL SHORTNESS OF BREATH *UNUSUAL BRUISING OR BLEEDING *URINARY PROBLEMS (pain or burning when urinating, or frequent urination) *BOWEL PROBLEMS (unusual diarrhea, constipation, pain near the anus) TENDERNESS IN MOUTH AND THROAT WITH OR WITHOUT PRESENCE OF ULCERS (sore throat, sores in mouth, or a toothache) UNUSUAL RASH, SWELLING OR PAIN  UNUSUAL VAGINAL DISCHARGE OR ITCHING   Items with * indicate a potential emergency and should be followed  up as soon as possible or go to the Emergency Department if any problems should occur.  Please show the CHEMOTHERAPY ALERT CARD or IMMUNOTHERAPY ALERT CARD at check-in to the Emergency Department and triage nurse.  Should you have questions after your visit or need to cancel or reschedule your appointment, please contact [facility] [place] - A DEPT OF [facility] [phone]  and follow the prompts.  Office hours are 8:00 a.m. to 4:30 p.m. Monday - Friday. Please note that voicemails left after 4:00 p.m. may not be returned until the following business day.  We are closed weekends and major holidays. You have access to a nurse at all times for urgent questions. Please call the main number to the clinic [phone] and follow the prompts.  For any non-urgent questions, you may also contact your provider using MyChart. We now offer e-Visits for anyone 55 and older to request care online for non-urgent symptoms. For details visit [email or web address].   Also download the MyChart app! Go to the app store, search MyChart, open the app, select [place], and log in with your MyChart username and password.

## 2024-08-13 NOTE — Progress Notes (Signed)
 SPIRITUAL CARE AND COUNSELING CONSULT NOTE   VISIT SUMMARY   Reason for Visit: Chaplain responding to referral from RN suggesting Pt  could use an extra layer of support today.  This is a patient I have a previous relationship with.  Description of Visit: Upon entering the room I found Marvin Carr in the recliner receiving treatment and with no one there as a support person.  I asked how he was holding up and he answered slowly and meekly- That's a good question.  This is a significant change from our previous interactions- Marvin Carr has previously been gregarious and positive.   Marvin Carr spoke of some anxiety stemming from his concern that he is running out of treatment options and feels as though he is living waiting for the other shoe to drop.  He states that he finds it difficult to motivate himself when he feels his outlook is so grim.  He states he is alarmed that things which typically motivate and energize him he now has little or no interest in.    Marvin Carr states his belief that God has the ultimate control in his life, he is finding that less comforting than he once did.  We engaged in a process of theological exploration regarding this and came to a conclusion that appeared satisfying and comforting to him.  I offered prayer and a promise to follow up with him more frequently now as he is struggling    Plan of Care: I will plan to follow up with this Pt on a weekly basis   SPIRITUAL ENCOUNTER                                                                                                                                                                      Type of Visit: Follow up Care provided to:: Patient Conversation partners present during encounter: Nurse Referral source: Nurse (RN/NT/LPN) Reason for visit: Urgent spiritual support OnCall Visit: No   SPIRITUAL FRAMEWORK  Presenting Themes: Meaning/purpose/sources of inspiration, Goals in life/care, Values and beliefs, Significant life  change, Coping tools, Courage hope and growth, Rituals and practive Values/beliefs: Marvin Carr [employer] faith and uses it in coping and for guidance in his life. 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 Patient Stress Factors: Exhausted, Loss of control, Major life changes Family Stress Factors: Not reviewed   GOALS   Self/Personal Goals: Continue to fight, but also mentioned balancing treatment and quality of life Clinical Care Goals: Promote Peace and calm; Create sacred space for theological exploration   INTERVENTIONS   Spiritual Care Interventions Made: Compassionate presence, Reflective listening, Narrative/life review, Explored values/beliefs/practices/strengths, Meaning making,  Encouragement, Prayer    INTERVENTION OUTCOMES   Outcomes: Reduced isolation, Reduced anxiety, Reduced fear  SPIRITUAL CARE PLAN   Spiritual Care Issues Still Outstanding: Chaplain will continue to follow Follow up plan : Will increase connections and follow up on a weekly basis.   Maude Roll, MDiv Chaplain, [facility] [email or web address] [phone] 08/13/2024 10:33 AM

## 2024-08-13 NOTE — Progress Notes (Signed)
 Patient presents today for Adrucil  chemo injection. 1,000 mg IV push. Vital signs stable. Heart rate elevated on arrival. Patient denies any chest pain.  Patient took own Nitroglycerin  0.1 mg MG tablet prior to treatment.

## 2024-08-13 NOTE — Progress Notes (Signed)
 Patient had left message for staff to call and let him know what time to come today for his chemotherapy.    Ok to proceed with HR of 102 per md   Treatment given per orders. Patient tolerated it well without problems. Vitals stable and discharged home from clinic via wheelchair. Follow up as scheduled.

## 2024-08-13 NOTE — Progress Notes (Signed)
 Late entry-   Treatment given per orders. Patient tolerated it well without problems. Vitals stable and discharged home from clinic via wheelchair. Follow up as scheduled.  08-12-24 1630- upon charting , RN realized the adrucil  IV push was not given, patient had been discharged and chemotherapy not given, Patient only received leucovorin  . MD notified and pharmacist notified. MD stated ok to bring pt in the next day and give the chemotherapy. Per Pharmacy Adrucil  ok to be out in room temperature for 7 days.   Tried to get in touch with Pt to come back this evening but unable to get in touch with pt. Will call pt in the morning to set up a new appt.  08-13-24 patient had called and left a message to call him and he can come in today at anytime. appt made and pt came in to get Adrucil , treatment completed without any issues.

## 2024-08-13 NOTE — Progress Notes (Signed)
 Confirmed for treatment today with Dr Davonna:  Patient did not receive 5FU push on 08/12/24  Giving push this am with no repeat of leucovorin .  Patient will take his nitroglycerin  here at clinic.  Niels Molt, PharmD Clinical Pharmacist

## 2024-08-14 ENCOUNTER — Inpatient Hospital Stay

## 2024-08-15 ENCOUNTER — Ambulatory Visit (HOSPITAL_COMMUNITY)
Admission: RE | Admit: 2024-08-15 | Discharge: 2024-08-15 | Disposition: A | Source: Ambulatory Visit | Attending: Oncology

## 2024-08-15 ENCOUNTER — Other Ambulatory Visit: Payer: Self-pay | Admitting: Oncology

## 2024-08-15 DIAGNOSIS — R180 Malignant ascites: Secondary | ICD-10-CM

## 2024-08-15 DIAGNOSIS — C221 Intrahepatic bile duct carcinoma: Secondary | ICD-10-CM

## 2024-08-15 DIAGNOSIS — C786 Secondary malignant neoplasm of retroperitoneum and peritoneum: Secondary | ICD-10-CM

## 2024-08-15 NOTE — Progress Notes (Signed)
 Limited abdominal US  of all 4 quadrants shows scant ascites in the right upper quadrant onnly which is not amenable to safe percutaneous paracentesis. Per patient he recently was started on Lasix .   No procedure performed. Imaging results discussed with patient.  Images are available for review under imaging section of Epic.  Please call with questions or concerns.  Marvin Carr

## 2024-08-16 ENCOUNTER — Other Ambulatory Visit: Payer: Self-pay

## 2024-08-16 ENCOUNTER — Inpatient Hospital Stay (HOSPITAL_COMMUNITY)
Admission: EM | Admit: 2024-08-16 | Discharge: 2024-08-23 | DRG: 388 | Disposition: A | Discharge status: Home / Self Care | Attending: Internal Medicine | Admitting: Internal Medicine

## 2024-08-16 ENCOUNTER — Inpatient Hospital Stay (HOSPITAL_COMMUNITY)

## 2024-08-16 ENCOUNTER — Emergency Department (HOSPITAL_COMMUNITY)

## 2024-08-16 DIAGNOSIS — C248 Malignant neoplasm of overlapping sites of biliary tract: Secondary | ICD-10-CM | POA: Diagnosis present

## 2024-08-16 DIAGNOSIS — R188 Other ascites: Secondary | ICD-10-CM | POA: Diagnosis present

## 2024-08-16 DIAGNOSIS — Z6834 Body mass index (BMI) 34.0-34.9, adult: Secondary | ICD-10-CM | POA: Diagnosis not present

## 2024-08-16 DIAGNOSIS — K56691 Other complete intestinal obstruction: Principal | ICD-10-CM | POA: Diagnosis present

## 2024-08-16 DIAGNOSIS — E66811 Obesity, class 1: Secondary | ICD-10-CM | POA: Diagnosis present

## 2024-08-16 DIAGNOSIS — K559 Vascular disorder of intestine, unspecified: Principal | ICD-10-CM | POA: Diagnosis present

## 2024-08-16 DIAGNOSIS — Z743 Need for continuous supervision: Secondary | ICD-10-CM | POA: Diagnosis not present

## 2024-08-16 DIAGNOSIS — I5032 Chronic diastolic (congestive) heart failure: Secondary | ICD-10-CM | POA: Diagnosis present

## 2024-08-16 DIAGNOSIS — F419 Anxiety disorder, unspecified: Secondary | ICD-10-CM | POA: Diagnosis present

## 2024-08-16 DIAGNOSIS — E876 Hypokalemia: Secondary | ICD-10-CM | POA: Diagnosis present

## 2024-08-16 DIAGNOSIS — Z7952 Long term (current) use of systemic steroids: Secondary | ICD-10-CM

## 2024-08-16 DIAGNOSIS — Z955 Presence of coronary angioplasty implant and graft: Secondary | ICD-10-CM

## 2024-08-16 DIAGNOSIS — Z7982 Long term (current) use of aspirin: Secondary | ICD-10-CM

## 2024-08-16 DIAGNOSIS — Z7984 Long term (current) use of oral hypoglycemic drugs: Secondary | ICD-10-CM

## 2024-08-16 DIAGNOSIS — C786 Secondary malignant neoplasm of retroperitoneum and peritoneum: Secondary | ICD-10-CM | POA: Diagnosis present

## 2024-08-16 DIAGNOSIS — N179 Acute kidney failure, unspecified: Secondary | ICD-10-CM | POA: Diagnosis present

## 2024-08-16 DIAGNOSIS — Z5181 Encounter for therapeutic drug level monitoring: Secondary | ICD-10-CM | POA: Diagnosis not present

## 2024-08-16 DIAGNOSIS — Z79899 Other long term (current) drug therapy: Secondary | ICD-10-CM

## 2024-08-16 DIAGNOSIS — E782 Mixed hyperlipidemia: Secondary | ICD-10-CM | POA: Diagnosis present

## 2024-08-16 DIAGNOSIS — R6511 Systemic inflammatory response syndrome (SIRS) of non-infectious origin with acute organ dysfunction: Secondary | ICD-10-CM | POA: Diagnosis present

## 2024-08-16 DIAGNOSIS — F32A Depression, unspecified: Secondary | ICD-10-CM | POA: Diagnosis present

## 2024-08-16 DIAGNOSIS — C221 Intrahepatic bile duct carcinoma: Secondary | ICD-10-CM | POA: Diagnosis not present

## 2024-08-16 DIAGNOSIS — D72819 Decreased white blood cell count, unspecified: Secondary | ICD-10-CM | POA: Diagnosis present

## 2024-08-16 DIAGNOSIS — Z87891 Personal history of nicotine dependence: Secondary | ICD-10-CM

## 2024-08-16 DIAGNOSIS — K6389 Other specified diseases of intestine: Secondary | ICD-10-CM | POA: Diagnosis present

## 2024-08-16 DIAGNOSIS — Z8042 Family history of malignant neoplasm of prostate: Secondary | ICD-10-CM

## 2024-08-16 DIAGNOSIS — I5030 Unspecified diastolic (congestive) heart failure: Secondary | ICD-10-CM | POA: Diagnosis not present

## 2024-08-16 DIAGNOSIS — Z66 Do not resuscitate: Secondary | ICD-10-CM | POA: Diagnosis present

## 2024-08-16 DIAGNOSIS — Z7189 Other specified counseling: Secondary | ICD-10-CM | POA: Diagnosis not present

## 2024-08-16 DIAGNOSIS — D509 Iron deficiency anemia, unspecified: Secondary | ICD-10-CM | POA: Diagnosis present

## 2024-08-16 DIAGNOSIS — I25119 Atherosclerotic heart disease of native coronary artery with unspecified angina pectoris: Secondary | ICD-10-CM | POA: Diagnosis present

## 2024-08-16 DIAGNOSIS — T451X5A Adverse effect of antineoplastic and immunosuppressive drugs, initial encounter: Secondary | ICD-10-CM | POA: Diagnosis present

## 2024-08-16 DIAGNOSIS — N400 Enlarged prostate without lower urinary tract symptoms: Secondary | ICD-10-CM | POA: Diagnosis present

## 2024-08-16 DIAGNOSIS — Z515 Encounter for palliative care: Secondary | ICD-10-CM

## 2024-08-16 DIAGNOSIS — Z634 Disappearance and death of family member: Secondary | ICD-10-CM

## 2024-08-16 DIAGNOSIS — Z7969 Long term (current) use of other immunomodulators and immunosuppressants: Secondary | ICD-10-CM

## 2024-08-16 DIAGNOSIS — E11649 Type 2 diabetes mellitus with hypoglycemia without coma: Secondary | ICD-10-CM | POA: Diagnosis not present

## 2024-08-16 DIAGNOSIS — C787 Secondary malignant neoplasm of liver and intrahepatic bile duct: Secondary | ICD-10-CM | POA: Diagnosis present

## 2024-08-16 DIAGNOSIS — Z7901 Long term (current) use of anticoagulants: Secondary | ICD-10-CM | POA: Diagnosis not present

## 2024-08-16 DIAGNOSIS — D62 Acute posthemorrhagic anemia: Secondary | ICD-10-CM | POA: Diagnosis not present

## 2024-08-16 DIAGNOSIS — Z7902 Long term (current) use of antithrombotics/antiplatelets: Secondary | ICD-10-CM

## 2024-08-16 DIAGNOSIS — R197 Diarrhea, unspecified: Secondary | ICD-10-CM | POA: Diagnosis present

## 2024-08-16 DIAGNOSIS — R112 Nausea with vomiting, unspecified: Secondary | ICD-10-CM

## 2024-08-16 DIAGNOSIS — Z711 Person with feared health complaint in whom no diagnosis is made: Secondary | ICD-10-CM | POA: Diagnosis not present

## 2024-08-16 DIAGNOSIS — I110 Hypertensive heart disease with heart failure: Secondary | ICD-10-CM | POA: Diagnosis present

## 2024-08-16 DIAGNOSIS — E785 Hyperlipidemia, unspecified: Secondary | ICD-10-CM | POA: Diagnosis not present

## 2024-08-16 DIAGNOSIS — I10 Essential (primary) hypertension: Secondary | ICD-10-CM | POA: Diagnosis present

## 2024-08-16 DIAGNOSIS — Z8551 Personal history of malignant neoplasm of bladder: Secondary | ICD-10-CM

## 2024-08-16 DIAGNOSIS — I2510 Atherosclerotic heart disease of native coronary artery without angina pectoris: Secondary | ICD-10-CM | POA: Diagnosis not present

## 2024-08-16 DIAGNOSIS — Z825 Family history of asthma and other chronic lower respiratory diseases: Secondary | ICD-10-CM

## 2024-08-16 DIAGNOSIS — Z96642 Presence of left artificial hip joint: Secondary | ICD-10-CM | POA: Diagnosis present

## 2024-08-16 LAB — GLUCOSE, CAPILLARY: Glucose-Capillary: 132 mg/dL — ABNORMAL HIGH (ref 70–99)

## 2024-08-16 LAB — URINALYSIS, ROUTINE W REFLEX MICROSCOPIC
Bacteria, UA: NONE SEEN
Bilirubin Urine: NEGATIVE
Glucose, UA: >=500 mg/dL — AB
Hgb urine dipstick: NEGATIVE
Ketones, ur: NEGATIVE mg/dL
Leukocytes,Ua: NEGATIVE
Nitrite: NEGATIVE
Protein, ur: NEGATIVE mg/dL
Specific Gravity, Urine: 1.01 (ref 1.005–1.030)
pH: 5 (ref 5.0–8.0)

## 2024-08-16 LAB — COMPREHENSIVE METABOLIC PANEL WITH GFR
ALT: 75 U/L — ABNORMAL HIGH (ref 0–44)
AST: 100 U/L — ABNORMAL HIGH (ref 15–41)
Albumin: 3.3 g/dL — ABNORMAL LOW (ref 3.5–5.0)
Alkaline Phosphatase: 141 U/L — ABNORMAL HIGH (ref 38–126)
Anion gap: 16 — ABNORMAL HIGH (ref 5–15)
BUN: 20 mg/dL (ref 8–23)
CO2: 22 mmol/L (ref 22–32)
Calcium: 9.1 mg/dL (ref 8.9–10.3)
Chloride: 98 mmol/L (ref 98–111)
Creatinine, Ser: 1.17 mg/dL (ref 0.61–1.24)
GFR, Estimated: >60 mL/min (ref >60)
Glucose, Bld: 147 mg/dL — ABNORMAL HIGH (ref 70–99)
Potassium: 3.6 mmol/L (ref 3.5–5.1)
Sodium: 136 mmol/L (ref 135–145)
Total Bilirubin: 0.9 mg/dL (ref 0.0–1.2)
Total Protein: 6.2 g/dL — ABNORMAL LOW (ref 6.5–8.1)

## 2024-08-16 LAB — CBC WITH DIFFERENTIAL/PLATELET
Abs Immature Granulocytes: 0.01 K/uL (ref 0.00–0.07)
Basophils Absolute: 0 K/uL (ref 0.0–0.1)
Basophils Relative: 0 %
Eosinophils Absolute: 0 K/uL (ref 0.0–0.5)
Eosinophils Relative: 0 %
HCT: 34 % — ABNORMAL LOW (ref 39.0–52.0)
Hemoglobin: 11 g/dL — ABNORMAL LOW (ref 13.0–17.0)
Immature Granulocytes: 0 %
Lymphocytes Relative: 13 %
Lymphs Abs: 0.4 K/uL — ABNORMAL LOW (ref 0.7–4.0)
MCH: 30.8 pg (ref 26.0–34.0)
MCHC: 32.4 g/dL (ref 30.0–36.0)
MCV: 95.2 fL (ref 80.0–100.0)
Monocytes Absolute: 0.7 K/uL (ref 0.1–1.0)
Monocytes Relative: 22 %
Neutro Abs: 1.9 K/uL (ref 1.7–7.7)
Neutrophils Relative %: 65 %
Platelets: 178 K/uL (ref 150–400)
RBC: 3.57 MIL/uL — ABNORMAL LOW (ref 4.22–5.81)
RDW: 18.9 % — ABNORMAL HIGH (ref 11.5–15.5)
WBC: 3 K/uL — ABNORMAL LOW (ref 4.0–10.5)
nRBC: 0.7 % — ABNORMAL HIGH (ref 0.0–0.2)

## 2024-08-16 LAB — PROTIME-INR
INR: 1.1 (ref 0.8–1.2)
Prothrombin Time: 15.1 s (ref 11.4–15.2)

## 2024-08-16 LAB — TYPE AND SCREEN
ABO/RH(D): O POS
Antibody Screen: NEGATIVE

## 2024-08-16 LAB — LIPASE, BLOOD: Lipase: 20 U/L (ref 11–51)

## 2024-08-16 MED ORDER — SODIUM CHLORIDE 0.9% FLUSH
3.0000 mL | INTRAVENOUS | Status: DC | PRN
  Administered 2024-08-16: 3 mL via INTRAVENOUS

## 2024-08-16 MED ORDER — MORPHINE SULFATE (PF) 4 MG/ML IV SOLN
4.0000 mg | Freq: Once | INTRAVENOUS | Status: AC
  Administered 2024-08-16: 4 mg via INTRAVENOUS
  Filled 2024-08-16: qty 1

## 2024-08-16 MED ORDER — ACETAMINOPHEN 325 MG PO TABS: 650.0000 mg | ORAL_TABLET | Freq: Four times a day (QID) | ORAL | Status: DC | PRN

## 2024-08-16 MED ORDER — DIPHENOXYLATE-ATROPINE 2.5-0.025 MG PO TABS: 1.0000 | ORAL_TABLET | Freq: Four times a day (QID) | ORAL | Status: DC | PRN

## 2024-08-16 MED ORDER — PANTOPRAZOLE SODIUM 40 MG IV SOLR
40.0000 mg | Freq: Two times a day (BID) | INTRAVENOUS | Status: DC
  Administered 2024-08-17 – 2024-08-23 (×13): 40 mg via INTRAVENOUS
  Filled 2024-08-16 (×13): qty 10

## 2024-08-16 MED ORDER — BISACODYL 10 MG RE SUPP: 10.0000 mg | Freq: Every day | RECTAL | Status: DC | PRN

## 2024-08-16 MED ORDER — ONDANSETRON HCL 4 MG/2ML IJ SOLN
4.0000 mg | Freq: Four times a day (QID) | INTRAMUSCULAR | Status: DC | PRN
  Administered 2024-08-16: 4 mg via INTRAVENOUS
  Filled 2024-08-16: qty 2

## 2024-08-16 MED ORDER — BUPROPION HCL ER (XL) 150 MG PO TB24
300.0000 mg | ORAL_TABLET | Freq: Every day | ORAL | Status: DC
  Administered 2024-08-17 – 2024-08-23 (×7): 300 mg via ORAL
  Filled 2024-08-16 (×7): qty 2

## 2024-08-16 MED ORDER — TICAGRELOR 90 MG PO TABS
90.0000 mg | ORAL_TABLET | Freq: Two times a day (BID) | ORAL | Status: DC
  Administered 2024-08-16 – 2024-08-17 (×3): 90 mg via ORAL
  Filled 2024-08-16 (×4): qty 1

## 2024-08-16 MED ORDER — MAGNESIUM OXIDE -MG SUPPLEMENT 400 (240 MG) MG PO TABS
200.0000 mg | ORAL_TABLET | Freq: Every day | ORAL | Status: DC
  Administered 2024-08-17: 200 mg via ORAL
  Filled 2024-08-16: qty 1

## 2024-08-16 MED ORDER — NITROGLYCERIN 0.4 MG SL SUBL: 0.4000 mg | SUBLINGUAL_TABLET | SUBLINGUAL | Status: DC | PRN

## 2024-08-16 MED ORDER — SODIUM CHLORIDE 0.9% FLUSH
3.0000 mL | Freq: Two times a day (BID) | INTRAVENOUS | Status: DC
  Administered 2024-08-16 – 2024-08-23 (×13): 3 mL via INTRAVENOUS

## 2024-08-16 MED ORDER — IRBESARTAN 75 MG PO TABS: 75.0000 mg | ORAL_TABLET | Freq: Every day | ORAL | Status: DC

## 2024-08-16 MED ORDER — PANTOPRAZOLE SODIUM 40 MG IV SOLR
40.0000 mg | Freq: Two times a day (BID) | INTRAVENOUS | Status: DC
  Administered 2024-08-16: 40 mg via INTRAVENOUS
  Filled 2024-08-16: qty 10

## 2024-08-16 MED ORDER — SODIUM CHLORIDE 0.9 % IV SOLN: INTRAVENOUS | Status: AC

## 2024-08-16 MED ORDER — ASPIRIN 81 MG PO TBEC
81.0000 mg | DELAYED_RELEASE_TABLET | Freq: Every day | ORAL | Status: DC
  Administered 2024-08-17: 81 mg via ORAL
  Filled 2024-08-16: qty 1

## 2024-08-16 MED ORDER — ONDANSETRON HCL 4 MG PO TABS: 4.0000 mg | ORAL_TABLET | Freq: Four times a day (QID) | ORAL | Status: DC | PRN

## 2024-08-16 MED ORDER — SODIUM CHLORIDE 0.9 % IV SOLN: INTRAVENOUS | Status: AC | PRN

## 2024-08-16 MED ORDER — LACTATED RINGERS IV BOLUS
1000.0000 mL | Freq: Once | INTRAVENOUS | Status: AC
  Administered 2024-08-16: 1000 mL via INTRAVENOUS

## 2024-08-16 MED ORDER — TRAZODONE HCL 50 MG PO TABS
50.0000 mg | ORAL_TABLET | Freq: Every evening | ORAL | Status: DC | PRN
  Administered 2024-08-17: 50 mg via ORAL
  Filled 2024-08-16: qty 1

## 2024-08-16 MED ORDER — INSULIN ASPART 100 UNIT/ML IJ SOLN: 0.0000 [IU] | Freq: Three times a day (TID) | INTRAMUSCULAR | Status: DC

## 2024-08-16 MED ORDER — HYDROMORPHONE HCL 1 MG/ML IJ SOLN
1.0000 mg | Freq: Once | INTRAMUSCULAR | Status: AC
  Administered 2024-08-16: 1 mg via INTRAVENOUS
  Filled 2024-08-16: qty 1

## 2024-08-16 MED ORDER — CYCLOBENZAPRINE HCL 10 MG PO TABS
10.0000 mg | ORAL_TABLET | Freq: Four times a day (QID) | ORAL | Status: DC | PRN
  Filled 2024-08-16: qty 1

## 2024-08-16 MED ORDER — ONDANSETRON HCL 4 MG/2ML IJ SOLN
4.0000 mg | Freq: Once | INTRAMUSCULAR | Status: AC
  Administered 2024-08-16: 4 mg via INTRAVENOUS
  Filled 2024-08-16: qty 2

## 2024-08-16 MED ORDER — TAMSULOSIN HCL 0.4 MG PO CAPS
0.4000 mg | ORAL_CAPSULE | Freq: Every day | ORAL | Status: DC
  Administered 2024-08-17 – 2024-08-22 (×6): 0.4 mg via ORAL
  Filled 2024-08-16 (×7): qty 1

## 2024-08-16 MED ORDER — HYDROMORPHONE HCL 1 MG/ML IJ SOLN: 1.0000 mg | INTRAMUSCULAR | Status: DC | PRN

## 2024-08-16 MED ORDER — PRAMIPEXOLE DIHYDROCHLORIDE 0.25 MG PO TABS: 0.7500 mg | ORAL_TABLET | Freq: Three times a day (TID) | ORAL | Status: DC

## 2024-08-16 MED ORDER — PIPERACILLIN-TAZOBACTAM 3.375 G IVPB
3.3750 g | Freq: Three times a day (TID) | INTRAVENOUS | Status: DC
  Administered 2024-08-17 – 2024-08-23 (×20): 3.375 g via INTRAVENOUS
  Filled 2024-08-16 (×20): qty 50

## 2024-08-16 MED ORDER — NIFEDIPINE ER OSMOTIC RELEASE 30 MG PO TB24
30.0000 mg | ORAL_TABLET | Freq: Every day | ORAL | Status: DC
  Administered 2024-08-17 – 2024-08-23 (×7): 30 mg via ORAL
  Filled 2024-08-16 (×7): qty 1

## 2024-08-16 MED ORDER — POLYETHYLENE GLYCOL 3350 17 G PO PACK: 17.0000 g | PACK | Freq: Every day | ORAL | Status: DC | PRN

## 2024-08-16 MED ORDER — PIPERACILLIN-TAZOBACTAM 3.375 G IVPB 30 MIN
3.3750 g | Freq: Once | INTRAVENOUS | Status: AC
  Administered 2024-08-16: 3.375 g via INTRAVENOUS
  Filled 2024-08-16: qty 50

## 2024-08-16 MED ORDER — OXYCODONE HCL 5 MG PO TABS
5.0000 mg | ORAL_TABLET | ORAL | Status: DC | PRN
  Administered 2024-08-19: 5 mg via ORAL
  Filled 2024-08-16 (×2): qty 1

## 2024-08-16 MED ORDER — INSULIN ASPART 100 UNIT/ML IJ SOLN: 0.0000 [IU] | Freq: Every day | INTRAMUSCULAR | Status: DC

## 2024-08-16 MED ORDER — IOHEXOL 350 MG/ML SOLN
100.0000 mL | Freq: Once | INTRAVENOUS | Status: AC | PRN
  Administered 2024-08-16: 100 mL via INTRAVENOUS

## 2024-08-16 MED ORDER — HEPARIN SODIUM (PORCINE) 5000 UNIT/ML IJ SOLN
5000.0000 [IU] | Freq: Three times a day (TID) | INTRAMUSCULAR | Status: DC
  Administered 2024-08-16 – 2024-08-23 (×21): 5000 [IU] via SUBCUTANEOUS
  Filled 2024-08-16 (×20): qty 1

## 2024-08-16 MED ORDER — METOCLOPRAMIDE HCL 5 MG/ML IJ SOLN
10.0000 mg | Freq: Once | INTRAMUSCULAR | Status: AC
  Administered 2024-08-16: 10 mg via INTRAVENOUS
  Filled 2024-08-16: qty 2

## 2024-08-16 MED ORDER — LORATADINE 10 MG PO TABS
10.0000 mg | ORAL_TABLET | Freq: Every day | ORAL | Status: DC
  Administered 2024-08-17 – 2024-08-23 (×7): 10 mg via ORAL
  Filled 2024-08-16 (×7): qty 1

## 2024-08-16 MED ORDER — BENZONATATE 100 MG PO CAPS: 200.0000 mg | ORAL_CAPSULE | Freq: Three times a day (TID) | ORAL | Status: DC | PRN

## 2024-08-16 MED ORDER — ACETAMINOPHEN 650 MG RE SUPP: 650.0000 mg | Freq: Four times a day (QID) | RECTAL | Status: DC | PRN

## 2024-08-16 MED ORDER — METOPROLOL TARTRATE 12.5 MG HALF TABLET
12.5000 mg | ORAL_TABLET | Freq: Two times a day (BID) | ORAL | Status: DC
  Administered 2024-08-16 – 2024-08-23 (×14): 12.5 mg via ORAL
  Filled 2024-08-16 (×14): qty 1

## 2024-08-16 MED ORDER — PHENOL 1.4 % MT LIQD
1.0000 | OROMUCOSAL | Status: DC | PRN
  Administered 2024-08-17: 1 via OROMUCOSAL
  Filled 2024-08-16: qty 177

## 2024-08-16 NOTE — ED Notes (Signed)
 Report given to the [facility] of [place] [address] at this time. No questions, comments, or concerns after report was given.

## 2024-08-16 NOTE — H&P (Addendum)
 History and Physical    Patient: Marvin Carr FMW:[id number] DOB: [birth year] DOA: 08/16/2024 DOS: the patient was seen and examined on 08/16/2024 PCP: [name], MD  Patient coming from: Home  Chief Complaint:  Chief Complaint  Patient presents with   Abdominal Pain   HPI: Marvin Carr is a 68 y.o. 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  Patient denies melena, he did have watery stools that were brown, - Reports generalized weakness - Nausea persist despite antiemetics, he did take Lomotil  and Pepto-Bismol No fever  Or chills  - No chest pains no palpitations no dizziness - Patient endorses generalized weakness and malaise - In the ED CT angio GI bleed shows-Severely dilated small bowel loops with intramural gas, consistent with ischemic bowel disease.   2. No active gastrointestinal bleeding identified. 3. Hepatic low-density lesions consistent with metastatic disease. 4. 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 - UA unremarkable, INR 1.1, lipase is 20 - WBC 3.0 hemoglobin 11.0 platelets 178 - Creatinine 1.1, sodium 136 potassium 3.6 LFTs elevated - Stool occult blood test negative in the ED - EDP discussed case with on-call general surgeon Dr. Mavis at [facility], Dr. Mavis called and discussed case with on-call general surgeon at [facility] Dr. Rosanne--- recommends transfer to [facility] for further evaluation-patient is at high risk surgical candidate--given underlying malignancy with mets and DAPT  review of Systems: As mentioned in the history of present illness. All other systems reviewed  and are negative. Past Medical History:  Diagnosis Date   Anxiety    Arthritis    Bladder cancer (HCC) 09/11/2009   CAD (coronary artery disease), native coronary artery    a. Mildly elevated troponin 03/2013, cath with nonobstructive disease including 50% AV groove distal stenosis before large OM   Depression    Essential hypertension    Headache(784.0)    History of migraines   Hyperglycemia    Metastatic cancer to liver [facility])    Mixed hyperlipidemia    Neuropathy    Obesity    Port-A-Cath in place 09/30/2021   Pre-diabetes    Seasonal allergies    Sleep apnea    On CPAP   Past Surgical History:  Procedure Laterality Date   BIOPSY  09/23/2021   Procedure: BIOPSY;  Surgeon: Eartha Angelia Sieving, MD;  Location: [facility] ENDO [facility]ITE;  Service: Gastroenterology;;   COLONOSCOPY  06/28/2011   Procedure: COLONOSCOPY;  Surgeon: Claudis RAYMOND Rivet, MD;  Location: [facility] ENDO [facility];  Service: Endoscopy;  Laterality: N/A;   COLONOSCOPY WITH PROPOFOL  N/A 09/23/2021   Procedure: COLONOSCOPY WITH PROPOFOL ;  Surgeon: Eartha Angelia Sieving, MD;  Location: [facility];  Service: Gastroenterology;  Laterality: N/A;  940   CORONARY STENT INTERVENTION N/A 07/04/2024   Procedure: CORONARY STENT INTERVENTION;  Surgeon: Wendel Lurena POUR, MD;  Location: [facility] INVASIVE CV LAB;  Service: Cardiovascular;  Laterality: N/A;   ESOPHAGOGASTRODUODENOSCOPY (EGD) WITH PROPOFOL  N/A 09/23/2021   Procedure: ESOPHAGOGASTRODUODENOSCOPY (EGD) WITH PROPOFOL ;  Surgeon: Eartha Angelia Sieving, MD;  Location: [facility];  Service: Gastroenterology;  Laterality: N/A;   IR IMAGING GUIDED PORT INSERTION  09/28/2021   IR PARACENTESIS  09/28/2021   JOINT REPLACEMENT Right  hip   LEFT HEART CATH AND CORONARY ANGIOGRAPHY N/A 07/04/2024   Procedure: LEFT HEART CATH AND CORONARY ANGIOGRAPHY;  Surgeon: Wendel Lurena POUR, MD;  Location: [facility] INVASIVE CV LAB;  Service: Cardiovascular;  Laterality: N/A;   LEFT HEART CATHETERIZATION WITH  CORONARY ANGIOGRAM N/A 03/31/2013   Procedure: LEFT HEART CATHETERIZATION WITH CORONARY ANGIOGRAM;  Surgeon: Lynwood Schilling, MD;  Location[facility] CATH LAB;  Service: Cardiovascular;  Laterality: N/A;   POLYPECTOMY  09/23/2021   Procedure: POLYPECTOMY;  Surgeon: Eartha Angelia Sieving, MD;  Location: [facility];  Service: Gastroenterology;;   TOTAL HIP ARTHROPLASTY Left 01/15/2024   Procedure: ARTHROPLASTY, HIP, TOTAL, ANTERIOR APPROACH;  Surgeon: Ernie Cough, MD;  Location: [facility] ORS;  Service: Orthopedics;  Laterality: Left;   TURBT  09/11/2009   Social History:  reports that he quit smoking about 13 years ago. His smoking use included cigarettes. He started smoking about 23 years ago. He has a 5 pack-year smoking history. He has been exposed to tobacco smoke. He has never used smokeless tobacco. He reports current alcohol use. He reports that he does not use drugs.  No Known Allergies  Family History  Problem Relation Age of Onset   Cancer Father        MDS   COPD Father    Cancer Paternal Grandfather        prostate cancer    Prior to Admission medications   Medication Sig Start Date End Date Taking? Authorizing Provider  acetaminophen  (TYLENOL ) 500 MG tablet Take 1,000 mg by mouth every 8 (eight) hours as needed for mild pain (pain score 1-3) or moderate pain (pain score 4-6).   Yes [provider]  aspirin  EC 81 MG tablet Take 1 tablet (81 mg total) by mouth daily. Swallow whole. 07/07/24  Yes Al-Sultani, Anmar, MD  atorvastatin  (LIPITOR) 20 MG tablet Take 1 tablet (20 mg total) by mouth daily. 07/11/24 10/09/24 Yes Strader, Laymon HERO, PA-C  benzonatate  (TESSALON ) 200 MG capsule Take 1 capsule (200 mg total) by mouth 3 (three) times daily as needed for cough. 07/14/24  Yes Geofm Delon BRAVO, NP  bumetanide  (BUMEX ) 2 MG tablet Take 1 tablet (2 mg total) by mouth daily as needed. Patient taking differently: Take 2 mg by mouth daily. 05/21/24  Yes Strader, Brittany M, PA-C   buPROPion  (WELLBUTRIN  XL) 300 MG 24 hr tablet Take 300 mg by mouth daily. 09/17/23  Yes [provider]  cetirizine  (ZYRTEC ) 10 MG tablet Take 10 mg by mouth daily.   Yes [provider]  chlorproMAZINE  (THORAZINE ) 25 MG tablet Take 1 tablet (25 mg total) by mouth 3 (three) times daily as needed for hiccoughs. 07/28/24  Yes [name] Callander, MD  cyclobenzaprine  (FLEXERIL ) 10 MG tablet Take 1 tablet (10 mg total) by mouth every 6 (six) hours as needed for muscle spasms. 07/15/24  Yes Geofm Delon BRAVO, NP  dapagliflozin  propanediol (FARXIGA ) 10 MG TABS tablet Take 10 mg by mouth daily.   Yes [provider]  dexamethasone  (DECADRON ) 4 MG tablet Take 2 tablets (8 mg total) by mouth daily. Start the day after chemotherapy for 2 days. Take with food. 08/04/24  Yes Davonna Siad, MD  diphenoxylate -atropine  (LOMOTIL ) 2.5-0.025 MG tablet Take 1 tablet by mouth 4 (four) times daily as needed for diarrhea or loose stools. 08/13/24  Yes Davonna Siad, MD  docusate sodium  (COLACE) 100 MG capsule Take 200 mg by mouth as needed for mild constipation.   Yes [provider]  Eszopiclone 3 MG  TABS Take 3 mg by mouth at bedtime.   Yes [provider]  famotidine  (PEPCID ) 20 MG tablet Take 20 mg by mouth at bedtime. 09/17/23  Yes [provider]  fenofibrate  160 MG tablet Take 160 mg by mouth daily. 11/05/19  Yes [provider]  furosemide  (LASIX ) 20 MG tablet Take 1 tablet (20 mg total) by mouth 3 (three) times a week. Monday, Wednesday and Friday 08/04/24 11/02/24 Yes Zenaida Morene PARAS, MD  lidocaine  (LIDODERM ) 5 % Place 1 patch onto the skin as needed (for pain). Remove & Discard patch within 12 hours or as directed by MD   Yes [provider]  loperamide  (IMODIUM ) 2 MG capsule Take 1 capsule (2 mg total) by mouth as needed for diarrhea or loose stools. Take 2 capsules after the first watery stool, and then one capsule after each subsequent watery  stool. Do not exceed 8 capsules in 24 hours 08/12/24  Yes Kandala, Hyndavi, MD  magic mouthwash (lidocaine , diphenhydrAMINE , alum & mag hydroxide) suspension Swish and swallow 5 mLs 3 (three) times daily as needed for mouth pain. 07/15/24  Yes Geofm Delon BRAVO, NP  Magnesium  Oxide -Mg Supplement 200 MG TABS Take 200 mg by mouth daily.   Yes [provider]  metFORMIN  (GLUCOPHAGE -XR) 500 MG 24 hr tablet Take 500 mg by mouth daily with breakfast.   Yes [provider]  metoCLOPramide  (REGLAN ) 10 MG tablet Take 1 tablet (10 mg total) by mouth every 8 (eight) hours as needed (Hiccups). 07/28/24  Yes [name] Callander, MD  NIFEdipine  (PROCARDIA -XL/NIFEDICAL-XL) 30 MG 24 hr tablet Take 1 tablet (30 mg total) by mouth daily. 08/04/24  Yes Zenaida Morene PARAS, MD  nitroGLYCERIN  (NITROSTAT ) 0.4 MG SL tablet Place 0.4 mg under the tongue every 5 (five) minutes x 3 doses as needed for chest pain (if no relief after 2nd dose, proceed to ED or call 911).   Yes [provider]  omeprazole (PRILOSEC) 40 MG capsule Take 40 mg by mouth daily.   Yes [provider]  ondansetron  (ZOFRAN ) 8 MG tablet Take 1 tablet (8 mg total) by mouth every 8 (eight) hours as needed for nausea or vomiting. Start on the third day after chemotherapy. 07/28/24  Yes Davonna Siad, MD  pramipexole  (MIRAPEX ) 0.75 MG tablet Take 1 tablet (0.75 mg total) by mouth 3 (three) times daily. 08/30/22  Yes Rogers Hai, MD  prochlorperazine  (COMPAZINE ) 10 MG tablet Take 1 tablet (10 mg total) by mouth every 6 (six) hours as needed for nausea or vomiting. 07/28/24  Yes Davonna Siad, MD  tamsulosin  (FLOMAX ) 0.4 MG CAPS capsule Take 0.4 mg by mouth daily. 08/29/21  Yes [provider]  ticagrelor  (BRILINTA ) 90 MG TABS tablet Take 1 tablet (90 mg total) by mouth 2 (two) times daily. 07/07/24  Yes Al-Sultani, Anmar, MD  traMADol  (ULTRAM ) 50 MG tablet Take 50 mg by mouth at bedtime as needed for severe pain  (pain score 7-10).   Yes [provider]  traZODone  (DESYREL ) 100 MG tablet Take 50 mg by mouth at bedtime as needed for sleep.   Yes [provider]  triamcinolone (KENALOG) 0.1 % paste Use as directed 1 Application in the mouth or throat daily as needed (for mouth ulcers). 07/11/24  Yes [provider]  olmesartan  (BENICAR ) 40 MG tablet Take 40 mg by mouth daily.    [provider]  pantoprazole  (PROTONIX ) 40 MG tablet Take 1 tablet (40 mg total) by mouth daily. Patient not  taking: Reported on 08/16/2024 07/17/24 08/16/24  Donnamarie Lebron PARAS, MD    Physical Exam: Vitals:   08/16/24 1415 08/16/24 1526 08/16/24 1545 08/16/24 1601  BP: 126/81 (!) 145/80 134/80 135/80  Pulse: (!) 102 99 96 98  Resp: (!) 24 (!) 29 20 20   Temp:      TempSrc:      SpO2: 94% 92% 92% 93%  Weight:      Height:        Physical Exam Gen:- Awake Alert, in no acute distress  HEENT:- [place].AT, No sclera icterus Neck-Supple Neck,No JVD,.  Lungs-  CTAB , fair air movement bilaterally  CV- S1, S2 normal, RRR, right-sided subclavian Port-A-Cath in situ Abd-  +ve B.Sounds, Abd Soft, generalized lower abdominal tenderness without rebound or guarding, increased abdominal/truncal adiposity noted extremity/Skin:- No significant edema,   good pedal pulses  Psych-affect is appropriate, oriented x3 Neuro-no new focal deficits, no tremors  Data Reviewed: - In the ED CT angio GI bleed shows-Severely dilated small bowel loops with intramural gas, consistent with ischemic bowel disease.   2. No active gastrointestinal bleeding identified. 3. Hepatic low-density lesions consistent with metastatic disease. 4. 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 - UA unremarkable, INR 1.1, lipase is 20 - WBC 3.0 hemoglobin 11.0 platelets 178 - Creatinine 1.1, sodium 136 potassium 3.6 LFTs elevated - Stool occult blood test negative in the ED - EDP discussed case with on-call  general surgeon Dr. Mavis at [facility], Dr. Mavis called and discussed case with on-call general surgeon at [facility] Dr. Rosanne--- recommends transfer to [facility] for further evaluation-patient is at high risk surgical candidate--given underlying malignancy with mets and DAPT  Assessment and Plan: 1) ischemic bowel --In the ED CT angio GI bleed shows-Severely dilated small bowel loops with intramural gas, consistent with ischemic bowel disease.   =--Presenting with coffee-ground emesis -EDP discussed case with on-call general surgeon Dr. Mavis at [facility], Dr. Mavis called and discussed case with on-call general surgeon at [facility] Dr. Rosanne--- recommends transfer to [facility] for further evaluation-patient is at high risk surgical candidate--given underlying malignancy with mets and DAPT --IV Zosyn  -IV Protonix  -N.p.o. except for sips with meds -IV fluids while n.p.o. -Prn antiemetics and as needed opiates -Further management per general surgery with consultation with cardiology service regarding DAPT use perioperatively --  2)Metastatic cholangiocarcinoma dxed in 2022 with peritoneal cacinomatosis and metastasis to the liver and bile duct. Initially treated with cisplatin , gemcitabine , and durvalumab  for 6 months.  Had eventual disease progression in 2025, treatment delayed by hip replacement in May 2025.  He was transitioned to FOLFOX 06/2024.. --Please consult oncology while inpatient   3)H/o  CAD s/p PCI to the Lcx (stent placed 07/04/24) currently on Brilinta /aspirin , -- -High-grade distal left circumflex lesion treated with one 3.5 x 16 mm Synergy XD stent postdilated to 3.75 mm. -Cardiologist recommended Dual antiplatelet therapy for at least 6 months but preferably 1 year with aspirin  and Brilinta  followed by Brilinta  monotherapy indefinitely. - Hold atorvastatin  due to elevated LFTs -Continue metoprolol  -Needs consultation with cardiology service regarding DAPT use  perioperatively  4)HFpEF echo from 07/03/2024 with EF of 60 to 65%, grade 1 diastolic dysfunction noted --Hold diuretics due to # 1 above  5)DM2- A1C is 5.7 reflecting excellent diabetic control PTA - Hold PTA metformin  and Farxiga  Use Novolog /Humalog Sliding scale insulin  with Accu-Cheks/Fingersticks as ordered   6)HTN--continue Procardia  and metoprolol   7)Mild anemia and leukopenia----overall stable, no bleeding concerns due to underlying  malignancy and chemo treatments  8) social/ethics--- patient wants to be DNR - However, no limitations to treatment   Disposition---transferred from [facility] to [facility] for further general surgery evaluation for ischemic bowel concerns with risk of dead gut- -currently on Brilinta  for recent stent placement -Patient will need oncology, cardiology, and general surgery and palliative care input -Primary contact is Ms Madison Lindau   Advance Care Planning:   Code Status: Do not attempt resuscitation (DNR) PRE-ARREST INTERVENTIONS DESIRED   Consults: General Surgery  Family Communication: As per pt  Primary Contact is Ms Jan Herring  Severity of Illness: The appropriate patient status for this patient is INPATIENT. Inpatient status is judged to be reasonable and necessary in order to provide the required intensity of service to ensure the patient's safety. The patient's presenting symptoms, physical exam findings, and initial radiographic and laboratory data in the context of their chronic comorbidities is felt to place them at high risk for further clinical deterioration. Furthermore, it is not anticipated that the patient will be medically stable for discharge from the hospital within 2 midnights of admission.   * I certify that at the point of admission it is my clinical judgment that the patient will require inpatient hospital care spanning beyond 2 midnights from the point of admission due to high intensity of service, high risk for further deterioration and  high frequency of surveillance required.*  Author: Rendall Carwin, MD 08/16/2024 7:13 PM  For on call review [email or web address].

## 2024-08-16 NOTE — ED Provider Notes (Signed)
 [place] EMERGENCY DEPARTMENT AT [facility] Provider Note   CSN: [id number] Arrival date & time: 08/16/24  1253     Patient presents with: Abdominal Pain   Marvin Carr is a 68 y.o. male.  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  Patient notes he has had diffuse diarrhea has been watery.  He has been trying to take Lomotil  and Compazine  with minimal relief.  He also notes he has been feeling nauseous.  He states he did have 1 episode of vomiting this morning but looks like coffee grounds.  States he is on aspirin  but no other blood thinners.  He notes he did have an appointment yesterday to have fluid drawn off for ascites but there was not a significant amount of fluid to be drawn off so no procedure was performed.  He states he is on chemotherapy weekly with the last treatment 3 days prior.  Takes tramadol  occasionally as needed for pain as well as Compazine  for nausea.  Also notes he has been taking Pepto-Bismol daily.  Denies fevers, chills, headache, syncope, chest pain, shortness of breath, hematochezia, urinary symptoms.    Abdominal Pain Associated symptoms: diarrhea, nausea and vomiting   Associated symptoms: no chest pain, no constipation, no dysuria, no fever and no shortness of breath        Prior to Admission medications   Medication Sig Start Date End Date Taking? Authorizing Provider  acetaminophen  (TYLENOL ) 500 MG tablet Take 1,000 mg by mouth every 8 (eight) hours as needed for mild pain (pain score 1-3) or moderate pain (pain score 4-6).   Yes [provider]  aspirin  EC 81 MG tablet Take 1 tablet (81 mg total) by mouth daily. Swallow whole. 07/07/24  Yes Al-Sultani, Anmar, MD  atorvastatin  (LIPITOR) 20 MG tablet Take 1 tablet (20  mg total) by mouth daily. 07/11/24 10/09/24 Yes Strader, Laymon HERO, PA-C  benzonatate  (TESSALON ) 200 MG capsule Take 1 capsule (200 mg total) by mouth 3 (three) times daily as needed for cough. 07/14/24  Yes Geofm Delon BRAVO, NP  bumetanide  (BUMEX ) 2 MG tablet Take 1 tablet (2 mg total) by mouth daily as needed. Patient taking differently: Take 2 mg by mouth daily. 05/21/24  Yes Strader, Brittany M, PA-C  buPROPion  (WELLBUTRIN  XL) 300 MG 24 hr tablet Take 300 mg by mouth daily. 09/17/23  Yes [provider]  cetirizine  (ZYRTEC ) 10 MG tablet Take 10 mg by mouth daily.   Yes [provider]  chlorproMAZINE  (THORAZINE ) 25 MG tablet Take 1 tablet (25 mg total) by mouth 3 (three) times daily as needed for hiccoughs. 07/28/24  Yes [name] Callander, MD  cyclobenzaprine  (FLEXERIL ) 10 MG tablet Take 1 tablet (10 mg total) by mouth every 6 (six) hours as needed for muscle spasms. 07/15/24  Yes Geofm Delon BRAVO, NP  dapagliflozin  propanediol (FARXIGA ) 10 MG TABS tablet Take 10 mg by mouth daily.   Yes [provider]  dexamethasone  (DECADRON ) 4 MG tablet Take 2 tablets (8 mg total) by mouth daily. Start the day after chemotherapy for 2 days. Take with food. 08/04/24  Yes Davonna Siad, MD  diphenoxylate -atropine  (LOMOTIL ) 2.5-0.025 MG tablet Take 1 tablet by mouth 4 (four) times daily as needed for diarrhea or loose stools. 08/13/24  Yes Davonna Siad, MD  docusate sodium  (COLACE)  100 MG capsule Take 200 mg by mouth as needed for mild constipation.   Yes [provider]  Eszopiclone 3 MG TABS Take 3 mg by mouth at bedtime.   Yes [provider]  famotidine  (PEPCID ) 20 MG tablet Take 20 mg by mouth at bedtime. 09/17/23  Yes [provider]  fenofibrate  160 MG tablet Take 160 mg by mouth daily. 11/05/19  Yes [provider]  furosemide  (LASIX ) 20 MG tablet Take 1 tablet (20 mg total) by mouth 3 (three) times a week. Monday, Wednesday and Friday 08/04/24  11/02/24 Yes Zenaida Morene PARAS, MD  lidocaine  (LIDODERM ) 5 % Place 1 patch onto the skin as needed (for pain). Remove & Discard patch within 12 hours or as directed by MD   Yes [provider]  loperamide  (IMODIUM ) 2 MG capsule Take 1 capsule (2 mg total) by mouth as needed for diarrhea or loose stools. Take 2 capsules after the first watery stool, and then one capsule after each subsequent watery stool. Do not exceed 8 capsules in 24 hours 08/12/24  Yes Kandala, Hyndavi, MD  magic mouthwash (lidocaine , diphenhydrAMINE , alum & mag hydroxide) suspension Swish and swallow 5 mLs 3 (three) times daily as needed for mouth pain. 07/15/24  Yes Geofm Delon BRAVO, NP  Magnesium  Oxide -Mg Supplement 200 MG TABS Take 200 mg by mouth daily.   Yes [provider]  metFORMIN  (GLUCOPHAGE -XR) 500 MG 24 hr tablet Take 500 mg by mouth daily with breakfast.   Yes [provider]  metoCLOPramide  (REGLAN ) 10 MG tablet Take 1 tablet (10 mg total) by mouth every 8 (eight) hours as needed (Hiccups). 07/28/24  Yes [name] Callander, MD  NIFEdipine  (PROCARDIA -XL/NIFEDICAL-XL) 30 MG 24 hr tablet Take 1 tablet (30 mg total) by mouth daily. 08/04/24  Yes Zenaida Morene PARAS, MD  nitroGLYCERIN  (NITROSTAT ) 0.4 MG SL tablet Place 0.4 mg under the tongue every 5 (five) minutes x 3 doses as needed for chest pain (if no relief after 2nd dose, proceed to ED or call 911).   Yes [provider]  omeprazole (PRILOSEC) 40 MG capsule Take 40 mg by mouth daily.   Yes [provider]  ondansetron  (ZOFRAN ) 8 MG tablet Take 1 tablet (8 mg total) by mouth every 8 (eight) hours as needed for nausea or vomiting. Start on the third day after chemotherapy. 07/28/24  Yes Davonna Siad, MD  pramipexole  (MIRAPEX ) 0.75 MG tablet Take 1 tablet (0.75 mg total) by mouth 3 (three) times daily. 08/30/22  Yes Rogers Hai, MD  prochlorperazine  (COMPAZINE ) 10 MG tablet Take 1 tablet (10 mg total) by mouth every 6 (six)  hours as needed for nausea or vomiting. 07/28/24  Yes Davonna Siad, MD  tamsulosin  (FLOMAX ) 0.4 MG CAPS capsule Take 0.4 mg by mouth daily. 08/29/21  Yes [provider]  ticagrelor  (BRILINTA ) 90 MG TABS tablet Take 1 tablet (90 mg total) by mouth 2 (two) times daily. 07/07/24  Yes Al-Sultani, Anmar, MD  traMADol  (ULTRAM ) 50 MG tablet Take 50 mg by mouth at bedtime as needed for severe pain (pain score 7-10).   Yes [provider]  traZODone  (DESYREL ) 100 MG tablet Take 50 mg by mouth at bedtime as needed for sleep.   Yes [provider]  triamcinolone (KENALOG) 0.1 % paste Use as directed 1 Application in the mouth or throat daily as needed (for mouth ulcers). 07/11/24  Yes [provider]  olmesartan  (BENICAR ) 40 MG tablet Take 40 mg by mouth daily.  [provider]  pantoprazole  (PROTONIX ) 40 MG tablet Take 1 tablet (40 mg total) by mouth daily. Patient not taking: Reported on 08/16/2024 07/17/24 08/16/24  Ezenduka, Nkeiruka J, MD    Allergies: Patient has no known allergies.    Review of Systems  Constitutional:  Negative for fever.  Respiratory:  Negative for shortness of breath.   Cardiovascular:  Negative for chest pain.  Gastrointestinal:  Positive for abdominal pain, diarrhea, nausea and vomiting. Negative for blood in stool and constipation.  Genitourinary:  Negative for dysuria.  Neurological:  Negative for headaches.    Updated Vital Signs BP 135/80   Pulse 98   Temp 97.8 F (36.6 C) (Oral)   Resp 20   Ht 6' (1.829 m)   Wt 115.7 kg   SpO2 93%   BMI 34.58 kg/m   Physical Exam Constitutional:      Appearance: He is well-developed.  HENT:     Head: Normocephalic and atraumatic.  Cardiovascular:     Rate and Rhythm: Tachycardia present.  Pulmonary:     Effort: Pulmonary effort is normal.  Abdominal:     General: There is distension.     Palpations: Abdomen is soft.     Tenderness: There is generalized abdominal  tenderness.  Skin:    General: Skin is warm and dry.  Neurological:     Mental Status: He is alert and oriented to person, place, and time.  Psychiatric:        Mood and Affect: Mood normal.        Behavior: Behavior normal.     (all labs ordered are listed, but only abnormal results are displayed) Labs Reviewed  COMPREHENSIVE METABOLIC PANEL WITH GFR - Abnormal; Notable for the following components:      Result Value   Glucose, Bld 147 (*)    Total Protein 6.2 (*)    Albumin  3.3 (*)    AST 100 (*)    ALT 75 (*)    Alkaline Phosphatase 141 (*)    Anion gap 16 (*)    All other components within normal limits  CBC WITH DIFFERENTIAL/PLATELET - Abnormal; Notable for the following components:   WBC 3.0 (*)    RBC 3.57 (*)    Hemoglobin 11.0 (*)    HCT 34.0 (*)    RDW 18.9 (*)    nRBC 0.7 (*)    Lymphs Abs 0.4 (*)    All other components within normal limits  URINALYSIS, ROUTINE W REFLEX MICROSCOPIC - Abnormal; Notable for the following components:   Glucose, UA >=500 (*)    All other components within normal limits  LIPASE, BLOOD  PROTIME-INR  POC OCCULT BLOOD, ED  TYPE AND SCREEN    EKG: EKG Interpretation Date/Time:  Saturday August 16 2024 13:32:50 EST Ventricular Rate:  110 PR Interval:  161 QRS Duration:  91 QT Interval:  325 QTC Calculation: 440 R Axis:   72  Text Interpretation: Sinus tachycardia Since last tracing rate faster Confirmed by Cleotilde Rogue ([id number]) on 08/16/2024 1:40:38 PM  Radiology: CT ANGIO GI BLEED Result Date: 08/16/2024 EXAM: CTA ABDOMEN AND PELVIS WITH CONTRAST 08/16/2024 03:23:46 PM TECHNIQUE: CTA images of the abdomen and pelvis with intravenous contrast. 100 mL iohexol  (OMNIPAQUE ) 350 MG/ML injection. Three-dimensional MIP/volume rendered formations were performed. Automated exposure control, iterative reconstruction, and/or weight based adjustment of the mA/kV was utilized to reduce the radiation dose to as low as reasonably  achievable. COMPARISON: 05/15/2024 and 06/03/2024. CLINICAL HISTORY: Suspected GI bleed.  FINDINGS: VASCULATURE: GI BLEED: No active extravasation of contrast within the GI tract. AORTA: No acute finding. No abdominal aortic aneurysm. No dissection. CELIAC TRUNK: No acute finding. No occlusion or significant stenosis. SUPERIOR MESENTERIC ARTERY: No acute finding. No occlusion or significant stenosis. INFERIOR MESENTERIC ARTERY: No acute finding. No occlusion or significant stenosis. RENAL ARTERIES: No acute finding. No occlusion or significant stenosis. ILIAC ARTERIES: No acute finding. No occlusion or significant stenosis. ABDOMEN/PELVIS: LOWER CHEST: Visualized portion of the lower chest demonstrates no acute abnormality. LIVER: Hepatic low density is again noted consistent with metastatic disease. GALLBLADDER AND BILE DUCTS: Cholelithiasis. No biliary ductal dilatation. SPLEEN: The spleen is unremarkable. PANCREAS: The pancreas is unremarkable. ADRENAL GLANDS: Bilateral adrenal glands demonstrate no acute abnormality. KIDNEYS, URETERS AND BLADDER: No stones in the kidneys or ureters. No hydronephrosis. No perinephric or periureteral stranding. Urinary bladder is unremarkable. GI AND BOWEL: Severely dilated small bowel loops are noted with intramural gas in multiple small bowel loops consistent with ischemic bowel disease. No colonic dilatation is noted. Stomach and duodenal sweep demonstrate no acute abnormality. REPRODUCTIVE: Reproductive organs are unremarkable. PERITONEUM AND RETROPERITONEUM: Mild ascites is noted. 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 No free air. LYMPH NODES: No lymphadenopathy. BONES AND SOFT TISSUES: Status post bilateral hip arthroplasties. No acute abnormality of the bones. No acute soft tissue abnormality. IMPRESSION: 1. Severely dilated small bowel loops with intramural gas, consistent with ischemic bowel disease. This finding was discussed with Dr.  Melvenia at 3:38 pm on 08/16/2024. 2. No active gastrointestinal bleeding identified. 3. Hepatic low-density lesions consistent with metastatic disease. 4. 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 Electronically signed by: Lynwood Seip MD 08/16/2024 03:40 PM EST RP Workstation: [id number]   US  ASCITES (ABDOMEN LIMITED) Result Date: 08/15/2024 CLINICAL DATA:  Patient with history of cholangiocarcinoma with malignant ascites, recently started on diuretic medication. Request for possible therapeutic paracentesis. EXAM: LIMITED ABDOMEN ULTRASOUND FOR ASCITES TECHNIQUE: Limited ultrasound survey for ascites was performed in all four abdominal quadrants. COMPARISON:  None Available. FINDINGS: Limited abdominal ultrasound of all four abdominal quadrants shows very small midline right upper quadrant pocket of ascites. No other areas of peritoneal fluid seen. IMPRESSION: Very small midline right upper quadrant pocket of ascites, not amenable to safe paracentesis. No procedure performed. Electronically Signed   By: Wilkie Lent M.D.   On: 08/15/2024 14:08      Medications Ordered in the ED  pantoprazole  (PROTONIX ) injection 40 mg (has no administration in time range)  lactated ringers  bolus 1,000 mL (0 mLs Intravenous Stopped 08/16/24 1546)  morphine  (PF) 4 MG/ML injection 4 mg (4 mg Intravenous Given 08/16/24 1340)  ondansetron  (ZOFRAN ) injection 4 mg (4 mg Intravenous Given 08/16/24 1340)  iohexol  (OMNIPAQUE ) 350 MG/ML injection 100 mL (100 mLs Intravenous Contrast Given 08/16/24 1512)  metoCLOPramide  (REGLAN ) injection 10 mg (10 mg Intravenous Given 08/16/24 1545)  piperacillin -tazobactam (ZOSYN ) IVPB 3.375 g (3.375 g Intravenous New Bag/Given 08/16/24 1601)  HYDROmorphone  (DILAUDID ) injection 1 mg (1 mg Intravenous Given 08/16/24 1601)    Clinical Course as of 08/16/24 1744  Sat Aug 16, 2024  1346 Previous endoscopy performed 09/2021: Showed a 2 cm hiatal hernia.  Otherwise  esophagus normal.  There was congestive gastropathy noted in the gastric antrum abnormalities otherwise noted [AY]  1351 Hemoglobin(!): 11.0 Improved from 9.6 4 days prior [AY]  1402 Occult stool negative at bedside [AY]  1413 LFTs acutely elevated from 3 days prior. 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  He also notes he had 1 episode of coffee-ground emesis this morning.  On exam patient is alert and in no acute distress.  Physical exam as noted above.  He is initially tachycardic but in no acute respiratory distress.  Heart rate did improve with 1 L fluids.  LFTs were acutely elevated compared to labs 4 days prior.  Hemoglobin was stable at 11.  Initially Dr. Eartha with GI was consulted due to concerns of coffee-ground emesis and patient has not had a endoscopy since 2023.  He advised to obtain CT GI bleed study.  He did advise Protonix  40 mg twice daily and would recommend  to admit to hospitalist pending CT results.  However, unfortunately CT angio GI bleed study did reveal concerns for ischemic bowel disease.  Dr. Mavis with general surgery was consulted who did review patient's chart and speak with patient.  He advised that this would be a very high risk procedure due to peritoneal carcinomatosis as well as cholangiocarcinoma.  Patient advised he would still like to proceed with potential surgical intervention.  Dr. Mavis did feel this procedure could be too complex for this facility as patient does have a new drug-eluting stent placed in his heart as well as on Brilinta .  Dr. Stevie with general surgery at [facility] was consulted who agreed  this is most likely a very high risk surgery but would evaluate patient at [facility].  He advised to have NG tube placed as well as place patient on fluids/antibiotics.  Patient has given a dose of Zosyn .  Also given morphine  and Dilaudid  for pain.  Hospitalist has been consulted for admission and are agreeable to admit patient for further evaluation and management as well as transfer to [facility].  Patient stable while in ED.  Amount and/or Complexity of Data Reviewed Labs: ordered. Decision-making details documented in ED Course. Radiology: ordered.  Risk Prescription drug management. Decision regarding hospitalization.       Final diagnoses:  Ischemic bowel disease  Nausea vomiting and diarrhea    ED Discharge Orders     None          Neysa Thersia RAMAN, [place] 08/16/24 1744    Cleotilde Rogue, MD 08/17/24 [phone]

## 2024-08-16 NOTE — Consult Note (Signed)
 Consulting Physician: Deward PARAS Natividad Schlosser  Referring Provider: Dr. Mavis  Chief Complaint: Abdominal pain  Reason for Consult: Metastatic bowel obstruction   Subjective   HPI: Marvin Carr is an 68 y.o. male who is here for abdominal pain.  He has metastatic cholangiocarcinoma with peritoneal carcinomatosis.  He has had about a week of crampy abdominal pain which worsened to the point of bringing him into the ER.  He has vomited.  Last bowel movement was yesterday.    Past Medical History:  Diagnosis Date   Anxiety    Arthritis    Bladder cancer (HCC) 09/11/2009   CAD (coronary artery disease), native coronary artery    a. Mildly elevated troponin 03/2013, cath with nonobstructive disease including 50% AV groove distal stenosis before large OM   Depression    Essential hypertension    Headache(784.0)    History of migraines   Hyperglycemia    Metastatic cancer to liver [facility])    Mixed hyperlipidemia    Neuropathy    Obesity    Port-A-Cath in place 09/30/2021   Pre-diabetes    Seasonal allergies    Sleep apnea    On CPAP    Past Surgical History:  Procedure Laterality Date   BIOPSY  09/23/2021   Procedure: BIOPSY;  Surgeon: Eartha Angelia Sieving, MD;  [facility]: [facility];  Service: Gastroenterology;;   COLONOSCOPY  06/28/2011   Procedure: COLONOSCOPY;  Surgeon: Claudis RAYMOND Rivet, MD;  Location: [facility];  Service: Endoscopy;  Laterality: N/A;   COLONOSCOPY WITH PROPOFOL  N/A 09/23/2021   Procedure: COLONOSCOPY WITH PROPOFOL ;  Surgeon: Eartha Angelia Sieving, MD;  Location: [facility];  Service: Gastroenterology;  Laterality: N/A;  940   CORONARY STENT INTERVENTION N/A 07/04/2024   Procedure: CORONARY STENT INTERVENTION;  Surgeon: Wendel Lurena POUR, MD;  Location: [facility];  Service: Cardiovascular;  Laterality: N/A;   ESOPHAGOGASTRODUODENOSCOPY (EGD) WITH PROPOFOL  N/A 09/23/2021   Procedure: ESOPHAGOGASTRODUODENOSCOPY (EGD) WITH PROPOFOL ;   Surgeon: Eartha Angelia Sieving, MD;  [facility]: [facility];  Service: Gastroenterology;  Laterality: N/A;   IR IMAGING GUIDED PORT INSERTION  09/28/2021   IR PARACENTESIS  09/28/2021   JOINT REPLACEMENT Right    hip   LEFT HEART CATH AND CORONARY ANGIOGRAPHY N/A 07/04/2024   Procedure: LEFT HEART CATH AND CORONARY ANGIOGRAPHY;  Surgeon: Wendel Lurena POUR, MD;  Location: [facility];  Service: Cardiovascular;  Laterality: N/A;   LEFT HEART CATHETERIZATION WITH CORONARY ANGIOGRAM N/A 03/31/2013   Procedure: LEFT HEART CATHETERIZATION WITH CORONARY ANGIOGRAM;  Surgeon: Lynwood Schilling, MD;  Location: [facility] CATH LAB;  Service: Cardiovascular;  Laterality: N/A;   POLYPECTOMY  09/23/2021   Procedure: POLYPECTOMY;  Surgeon: Eartha Angelia Sieving, MD;  Location: [facility];  Service: Gastroenterology;;   TOTAL HIP ARTHROPLASTY Left 01/15/2024   Procedure: ARTHROPLASTY, HIP, TOTAL, ANTERIOR APPROACH;  Surgeon: Ernie Cough, MD;  Location: [facility];  Service: Orthopedics;  Laterality: Left;   TURBT  09/11/2009    Family History  Problem Relation Age of Onset   Cancer Father        MDS   COPD Father    Cancer Paternal Grandfather        prostate cancer    Social:  reports that he quit smoking about 13 years ago. His smoking use included cigarettes. He started smoking about 23 years ago. He has a 5 pack-year smoking history. He has been exposed to tobacco smoke. He has never used smokeless tobacco. He  reports current alcohol use. He reports that he does not use drugs.  Allergies: No Known Allergies  Medications: Current Outpatient Medications  Medication Instructions   acetaminophen  (TYLENOL ) 1,000 mg, Oral, Every 8 hours PRN   aspirin  EC 81 mg, Oral, Daily, Swallow whole.   atorvastatin  (LIPITOR) 20 mg, Oral, Daily   benzonatate  (TESSALON ) 200 mg, Oral, 3 times daily PRN   bumetanide  (BUMEX ) 2 mg, Oral, Daily PRN   buPROPion  (WELLBUTRIN  XL) 300 mg, Daily   cetirizine  (ZYRTEC ) 10  mg, Daily   chlorproMAZINE  (THORAZINE ) 25 mg, Oral, 3 times daily PRN   cyclobenzaprine  (FLEXERIL ) 10 mg, Oral, Every 6 hours PRN   dapagliflozin  propanediol (FARXIGA ) 10 mg, Daily   dexamethasone  (DECADRON ) 8 mg, Oral, Daily, Start the day after chemotherapy for 2 days. Take with food.   diphenoxylate -atropine  (LOMOTIL ) 2.5-0.025 MG tablet 1 tablet, Oral, 4 times daily PRN   docusate sodium  (COLACE) 200 mg, Oral, As needed   Eszopiclone 3 mg, Daily at bedtime   famotidine  (PEPCID ) 20 mg, Daily at bedtime   fenofibrate  160 mg, Daily   furosemide  (LASIX ) 20 mg, Oral, 3 times weekly, Monday, Wednesday and Friday   lidocaine  (LIDODERM ) 5 % 1 patch, Transdermal, As needed, Remove & Discard patch within 12 hours or as directed by MD   loperamide  (IMODIUM ) 2 mg, Oral, As needed, Take 2 capsules after the first watery stool, and then one capsule after each subsequent watery stool. Do not exceed 8 capsules in 24 hours   magic mouthwash (lidocaine , diphenhydrAMINE , alum & mag hydroxide) suspension 5 mLs, Swish & Swallow, 3 times daily PRN   Magnesium  Oxide -Mg Supplement 200 mg, Oral, Daily   metFORMIN  (GLUCOPHAGE -XR) 500 mg, Oral, Daily with breakfast   metoCLOPramide  (REGLAN ) 10 mg, Oral, Every 8 hours PRN   NIFEdipine  (PROCARDIA -XL/NIFEDICAL-XL) 30 mg, Oral, Daily   nitroGLYCERIN  (NITROSTAT ) 0.4 mg, Every 5 min x3 PRN   olmesartan  (BENICAR ) 40 mg, Daily   omeprazole (PRILOSEC) 40 mg, Oral, Daily   ondansetron  (ZOFRAN ) 8 mg, Oral, Every 8 hours PRN, Start on the third day after chemotherapy.   pantoprazole  (PROTONIX ) 40 mg, Oral, Daily   pramipexole  (MIRAPEX ) 0.75 mg, Oral, 3 times daily   prochlorperazine  (COMPAZINE ) 10 mg, Oral, Every 6 hours PRN   tamsulosin  (FLOMAX ) 0.4 mg, Daily   ticagrelor  (BRILINTA ) 90 mg, Oral, 2 times daily   traMADol  (ULTRAM ) 50 mg, At bedtime PRN   traZODone  (DESYREL ) 50 mg, Oral, At bedtime PRN   triamcinolone (KENALOG) 0.1 % paste 1 Application, Daily PRN     ROS - all of the below systems have been reviewed with the patient and positives are indicated with bold text General: chills, fever or night sweats Eyes: blurry vision or double vision ENT: epistaxis or sore throat Allergy/Immunology: itchy/watery eyes or nasal congestion Hematologic/Lymphatic: bleeding problems, blood clots or swollen lymph nodes Endocrine: temperature intolerance or unexpected weight changes Breast: new or changing breast lumps or nipple discharge Resp: cough, shortness of breath, or wheezing CV: chest pain or dyspnea on exertion GI: as per HPI GU: dysuria, trouble voiding, or hematuria MSK: joint pain or joint stiffness Neuro: TIA or stroke symptoms Derm: pruritus and skin lesion changes Psych: anxiety and depression  Objective   PE Blood pressure 137/80, pulse (!) 120, temperature (!) 97.1 F (36.2 C), resp. rate 20, height 6' (1.829 m), weight 115.7 kg, SpO2 92%. Constitutional: NAD; conversant; no deformities Eyes: Moist conjunctiva; no lid lag; anicteric; PERRL Neck: Trachea midline; no thyromegaly  Lungs: Normal respiratory effort; no tactile fremitus CV: RRR; no palpable thrills; no pitting edema GI: Abd Tight, distended; no palpable hepatosplenomegaly MSK: Normal range of motion of extremities; no clubbing/cyanosis Psychiatric: Appropriate affect; alert and oriented x3 Lymphatic: No palpable cervical or axillary lymphadenopathy  Results for orders placed or performed during the hospital encounter of 08/16/24 (from the past 24 hours)  Comprehensive metabolic panel     Status: Abnormal   Collection Time: 08/16/24  1:25 PM  Result Value Ref Range   Sodium 136 135 - 145 mmol/L   Potassium 3.6 3.5 - 5.1 mmol/L   Chloride 98 98 - 111 mmol/L   CO2 22 22 - 32 mmol/L   Glucose, Bld 147 (H) 70 - 99 mg/dL   BUN 20 8 - 23 mg/dL   Creatinine, Ser 8.82 0.61 - 1.24 mg/dL   Calcium  9.1 8.9 - 10.3 mg/dL   Total Protein 6.2 (L) 6.5 - 8.1 g/dL   Albumin  3.3  (L) 3.5 - 5.0 g/dL   AST 899 (H) 15 - 41 U/L   ALT 75 (H) 0 - 44 U/L   Alkaline Phosphatase 141 (H) 38 - 126 U/L   Total Bilirubin 0.9 0.0 - 1.2 mg/dL   GFR, Estimated >39 >39 mL/min   Anion gap 16 (H) 5 - 15  CBC with Differential     Status: Abnormal   Collection Time: 08/16/24  1:25 PM  Result Value Ref Range   WBC 3.0 (L) 4.0 - 10.5 K/uL   RBC 3.57 (L) 4.22 - 5.81 MIL/uL   Hemoglobin 11.0 (L) 13.0 - 17.0 g/dL   HCT 65.9 (L) 60.9 - 47.9 %   MCV 95.2 80.0 - 100.0 fL   MCH 30.8 26.0 - 34.0 pg   MCHC 32.4 30.0 - 36.0 g/dL   RDW 81.0 (H) 88.4 - 84.4 %   Platelets 178 150 - 400 K/uL   nRBC 0.7 (H) 0.0 - 0.2 %   Neutrophils Relative % 65 %   Neutro Abs 1.9 1.7 - 7.7 K/uL   Lymphocytes Relative 13 %   Lymphs Abs 0.4 (L) 0.7 - 4.0 K/uL   Monocytes Relative 22 %   Monocytes Absolute 0.7 0.1 - 1.0 K/uL   Eosinophils Relative 0 %   Eosinophils Absolute 0.0 0.0 - 0.5 K/uL   Basophils Relative 0 %   Basophils Absolute 0.0 0.0 - 0.1 K/uL   Immature Granulocytes 0 %   Abs Immature Granulocytes 0.01 0.00 - 0.07 K/uL  Lipase, blood     Status: None   Collection Time: 08/16/24  1:25 PM  Result Value Ref Range   Lipase 20 11 - 51 U/L  Type and screen [facility]     Status: None   Collection Time: 08/16/24  1:25 PM  Result Value Ref Range   ABO/RH(D) O POS    Antibody Screen NEG    Sample Expiration      08/19/2024,2359 Performed at [facility], [address], [place], [place] [address]   Protime-INR     Status: None   Collection Time: 08/16/24  1:25 PM  Result Value Ref Range   Prothrombin Time 15.1 11.4 - 15.2 seconds   INR 1.1 0.8 - 1.2  Urinalysis, Routine w reflex microscopic -Urine, Clean Catch     Status: Abnormal   Collection Time: 08/16/24  1:26 PM  Result Value Ref Range   Color, Urine YELLOW YELLOW   APPearance CLEAR CLEAR   Specific Gravity, Urine 1.010  1.005 - 1.030   pH 5.0 5.0 - 8.0   Glucose, UA >=500 (A) NEGATIVE mg/dL   Hgb urine dipstick NEGATIVE  NEGATIVE   Bilirubin Urine NEGATIVE NEGATIVE   Ketones, ur NEGATIVE NEGATIVE mg/dL   Protein, ur NEGATIVE NEGATIVE mg/dL   Nitrite NEGATIVE NEGATIVE   Leukocytes,Ua NEGATIVE NEGATIVE   RBC / HPF 0-5 0 - 5 RBC/hpf   WBC, UA 0-5 0 - 5 WBC/hpf   Bacteria, UA NONE SEEN NONE SEEN   Squamous Epithelial / HPF 0-5 0 - 5 /HPF   Hyaline Casts, UA PRESENT   Glucose, capillary     Status: Abnormal   Collection Time: 08/16/24 10:14 PM  Result Value Ref Range   Glucose-Capillary 132 (H) 70 - 99 mg/dL     Imaging Orders         CT ANGIO GI BLEED         DG Abd 1 View      Assessment and Plan   Marvin Carr is an 68 y.o. male with metastatic cholangiocarcinoma with peritoneal carcinomatosis who presented with abdominal pain, found to have bowel obstruction with pneumatosis on CT.  I recommend NG decompression and serial lactic acid levels.  Hopefully he responds to conservative management.  I think an operation would result in death or severe morbidity due to the extent of his cancer, discussed this with the patient.  He had 500 cc of brown drainage from his NG with initial placement.  Placement KUB still pending.  Surgery team will follow.  Agree with palliative consultation as his prognosis is grim.  Deward JINNY Foy, MD  [facility] Use [email or web address] to contact on call provider  New Patient Billing: [phone] - High MDM

## 2024-08-16 NOTE — Consult Note (Signed)
 Reason for Consult: Small bowel ischemia Referring Physician: Dr. Cleotilde Ade Blue is an 68 y.o. male.  HPI: Patient is a 68 year old white male who has been actively undergoing chemotherapy for metastatic cholangiocarcinoma with carcinomatosis since 2022, status post coronary stent placement for coronary artery disease on July 04, 2024 on Brilinta  who presented the emergency room with abdominal cramping, nausea, vomiting.  He had an episode of emesis earlier today that was black in color.  He describes generalized weakness.  He just had chemotherapy infusion several days ago.  He states that he has some generalized abdominal discomfort while lying in the bed.  His abdomen is distended past normal.  He denies any fever or chills.  CT scan of the abdomen and pelvis was remarkable for diffuse small bowel distention with intramural gas, consistent with pneumatosis.  No free air was noted.  He does have loculated ascites with evidence of carcinomatosis.  He does have liver involvement of his cholangiocarcinoma.  Past Medical History:  Diagnosis Date   Anxiety    Arthritis    Bladder cancer (HCC) 09/11/2009   CAD (coronary artery disease), native coronary artery    a. Mildly elevated troponin 03/2013, cath with nonobstructive disease including 50% AV groove distal stenosis before large OM   Depression    Essential hypertension    Headache(784.0)    History of migraines   Hyperglycemia    Metastatic cancer to liver [facility])    Mixed hyperlipidemia    Neuropathy    Obesity    Port-A-Cath in place 09/30/2021   Pre-diabetes    Seasonal allergies    Sleep apnea    On CPAP    Past Surgical History:  Procedure Laterality Date   BIOPSY  09/23/2021   Procedure: BIOPSY;  Surgeon: Eartha Angelia Sieving, MD;  Location: [facility];  Service: Gastroenterology;;   COLONOSCOPY  06/28/2011   Procedure: COLONOSCOPY;  Surgeon: Claudis RAYMOND Rivet, MD;  Location: [facility];  Service: Endoscopy;   Laterality: N/A;   COLONOSCOPY WITH PROPOFOL  N/A 09/23/2021   Procedure: COLONOSCOPY WITH PROPOFOL ;  Surgeon: Eartha Angelia Sieving, MD;  Location: [facility];  Service: Gastroenterology;  Laterality: N/A;  940   CORONARY STENT INTERVENTION N/A 07/04/2024   Procedure: CORONARY STENT INTERVENTION;  Surgeon: Wendel Lurena POUR, MD;  Location: [facility];  Service: Cardiovascular;  Laterality: N/A;   ESOPHAGOGASTRODUODENOSCOPY (EGD) WITH PROPOFOL  N/A 09/23/2021   Procedure: ESOPHAGOGASTRODUODENOSCOPY (EGD) WITH PROPOFOL ;  Surgeon: Eartha Angelia Sieving, MD;  Location: [facility];  Service: Gastroenterology;  Laterality: N/A;   IR IMAGING GUIDED PORT INSERTION  09/28/2021   IR PARACENTESIS  09/28/2021   JOINT REPLACEMENT Right    hip   LEFT HEART CATH AND CORONARY ANGIOGRAPHY N/A 07/04/2024   Procedure: LEFT HEART CATH AND CORONARY ANGIOGRAPHY;  Surgeon: Wendel Lurena POUR, MD;  Location: [facility];  Service: Cardiovascular;  Laterality: N/A;   LEFT HEART CATHETERIZATION WITH CORONARY ANGIOGRAM N/A 03/31/2013   Procedure: LEFT HEART CATHETERIZATION WITH CORONARY ANGIOGRAM;  Surgeon: Lynwood Schilling, MD;  Location: [facility] CATH LAB;  Service: Cardiovascular;  Laterality: N/A;   POLYPECTOMY  09/23/2021   Procedure: POLYPECTOMY;  Surgeon: Eartha Angelia Sieving, MD;  Location: [facility];  Service: Gastroenterology;;   TOTAL HIP ARTHROPLASTY Left 01/15/2024   Procedure: ARTHROPLASTY, HIP, TOTAL, ANTERIOR APPROACH;  Surgeon: Ernie Cough, MD;  Location: [facility] ORS;  Service: Orthopedics;  Laterality: Left;   TURBT  09/11/2009    Family History  Problem Relation Age of Onset   Cancer Father        MDS   COPD Father    Cancer Paternal Grandfather        prostate cancer    Social History:  reports that he quit smoking about 13 years ago. His smoking use included cigarettes. He started smoking about 23 years ago. He has a 5 pack-year smoking history. He has been exposed to tobacco  smoke. He has never used smokeless tobacco. He reports current alcohol use. He reports that he does not use drugs.  Allergies: No Known Allergies  Medications: Prior to Admission: (Not in a hospital admission)   Results for orders placed or performed during the hospital encounter of 08/16/24 (from the past 48 hours)  Comprehensive metabolic panel     Status: Abnormal   Collection Time: 08/16/24  1:25 PM  Result Value Ref Range   Sodium 136 135 - 145 mmol/L   Potassium 3.6 3.5 - 5.1 mmol/L   Chloride 98 98 - 111 mmol/L   CO2 22 22 - 32 mmol/L   Glucose, Bld 147 (H) 70 - 99 mg/dL    Comment: Glucose reference range applies only to samples taken after fasting for at least 8 hours.   BUN 20 8 - 23 mg/dL   Creatinine, Ser 8.82 0.61 - 1.24 mg/dL   Calcium  9.1 8.9 - 10.3 mg/dL   Total Protein 6.2 (L) 6.5 - 8.1 g/dL   Albumin  3.3 (L) 3.5 - 5.0 g/dL   AST 899 (H) 15 - 41 U/L   ALT 75 (H) 0 - 44 U/L   Alkaline Phosphatase 141 (H) 38 - 126 U/L   Total Bilirubin 0.9 0.0 - 1.2 mg/dL   GFR, Estimated >39 >39 mL/min    Comment: (NOTE) Calculated using the CKD-EPI Creatinine Equation (2021)    Anion gap 16 (H) 5 - 15    Comment: Performed at [facility], [address] [place], [place] [address]  CBC with Differential     Status: Abnormal   Collection Time: 08/16/24  1:25 PM  Result Value Ref Range   WBC 3.0 (L) 4.0 - 10.5 K/uL   RBC 3.57 (L) 4.22 - 5.81 MIL/uL   Hemoglobin 11.0 (L) 13.0 - 17.0 g/dL   HCT 65.9 (L) 60.9 - 47.9 %   MCV 95.2 80.0 - 100.0 fL   MCH 30.8 26.0 - 34.0 pg   MCHC 32.4 30.0 - 36.0 g/dL   RDW 81.0 (H) 88.4 - 84.4 %   Platelets 178 150 - 400 K/uL   nRBC 0.7 (H) 0.0 - 0.2 %   Neutrophils Relative % 65 %   Neutro Abs 1.9 1.7 - 7.7 K/uL   Lymphocytes Relative 13 %   Lymphs Abs 0.4 (L) 0.7 - 4.0 K/uL   Monocytes Relative 22 %   Monocytes Absolute 0.7 0.1 - 1.0 K/uL   Eosinophils Relative 0 %   Eosinophils Absolute 0.0 0.0 - 0.5 K/uL   Basophils Relative 0 %    Basophils Absolute 0.0 0.0 - 0.1 K/uL   Immature Granulocytes 0 %   Abs Immature Granulocytes 0.01 0.00 - 0.07 K/uL    Comment: Performed at [facility], [address], [place], [place] [address]  Lipase, blood     Status: None   Collection Time: 08/16/24  1:25 PM  Result Value Ref Range   Lipase 20 11 - 51 U/L    Comment: Performed at [facility], [address]  [place], [place] [address]  Type and screen [facility]     Status: None   Collection Time: 08/16/24  1:25 PM  Result Value Ref Range   ABO/RH(D) O POS    Antibody Screen NEG    Sample Expiration      08/19/2024,2359 Performed at [facility], [address] [place], [place] [address]   Protime-INR     Status: None   Collection Time: 08/16/24  1:25 PM  Result Value Ref Range   Prothrombin Time 15.1 11.4 - 15.2 seconds   INR 1.1 0.8 - 1.2    Comment: (NOTE) INR goal varies based on device and disease states. Performed at [facility], [address] [place], [place] [address]   Urinalysis, Routine w reflex microscopic -Urine, Clean Catch     Status: Abnormal   Collection Time: 08/16/24  1:26 PM  Result Value Ref Range   Color, Urine YELLOW YELLOW   APPearance CLEAR CLEAR   Specific Gravity, Urine 1.010 1.005 - 1.030   pH 5.0 5.0 - 8.0   Glucose, UA >=500 (A) NEGATIVE mg/dL   Hgb urine dipstick NEGATIVE NEGATIVE   Bilirubin Urine NEGATIVE NEGATIVE   Ketones, ur NEGATIVE NEGATIVE mg/dL   Protein, ur NEGATIVE NEGATIVE mg/dL   Nitrite NEGATIVE NEGATIVE   Leukocytes,Ua NEGATIVE NEGATIVE   RBC / HPF 0-5 0 - 5 RBC/hpf   WBC, UA 0-5 0 - 5 WBC/hpf   Bacteria, UA NONE SEEN NONE SEEN   Squamous Epithelial / HPF 0-5 0 - 5 /HPF   Hyaline Casts, UA PRESENT     Comment: Performed at [facility], [address], [place], [place] [address]    CT ANGIO GI BLEED Result Date: 08/16/2024 EXAM: CTA ABDOMEN AND PELVIS WITH CONTRAST 08/16/2024 03:23:46 PM TECHNIQUE: CTA images of the abdomen and pelvis with intravenous  contrast. 100 mL iohexol  (OMNIPAQUE ) 350 MG/ML injection. Three-dimensional MIP/volume rendered formations were performed. Automated exposure control, iterative reconstruction, and/or weight based adjustment of the mA/kV was utilized to reduce the radiation dose to as low as reasonably achievable. COMPARISON: 05/15/2024 and 06/03/2024. CLINICAL HISTORY: Suspected GI bleed. FINDINGS: VASCULATURE: GI BLEED: No active extravasation of contrast within the GI tract. AORTA: No acute finding. No abdominal aortic aneurysm. No dissection. CELIAC TRUNK: No acute finding. No occlusion or significant stenosis. SUPERIOR MESENTERIC ARTERY: No acute finding. No occlusion or significant stenosis. INFERIOR MESENTERIC ARTERY: No acute finding. No occlusion or significant stenosis. RENAL ARTERIES: No acute finding. No occlusion or significant stenosis. ILIAC ARTERIES: No acute finding. No occlusion or significant stenosis. ABDOMEN/PELVIS: LOWER CHEST: Visualized portion of the lower chest demonstrates no acute abnormality. LIVER: Hepatic low density is again noted consistent with metastatic disease. GALLBLADDER AND BILE DUCTS: Cholelithiasis. No biliary ductal dilatation. SPLEEN: The spleen is unremarkable. PANCREAS: The pancreas is unremarkable. ADRENAL GLANDS: Bilateral adrenal glands demonstrate no acute abnormality. KIDNEYS, URETERS AND BLADDER: No stones in the kidneys or ureters. No hydronephrosis. No perinephric or periureteral stranding. Urinary bladder is unremarkable. GI AND BOWEL: Severely dilated small bowel loops are noted with intramural gas in multiple small bowel loops consistent with ischemic bowel disease. No colonic dilatation is noted. Stomach and duodenal sweep demonstrate no acute abnormality. REPRODUCTIVE: Reproductive organs are unremarkable. PERITONEUM AND RETROPERITONEUM: Mild ascites is noted. 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 No free air. LYMPH NODES: No  lymphadenopathy. BONES AND SOFT TISSUES: Status post bilateral hip arthroplasties. No acute abnormality of the bones. No acute soft tissue abnormality. IMPRESSION: 1.  Severely dilated small bowel loops with intramural gas, consistent with ischemic bowel disease. This finding was discussed with Dr. Melvenia at 3:38 pm on 08/16/2024. 2. No active gastrointestinal bleeding identified. 3. Hepatic low-density lesions consistent with metastatic disease. 4. 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 Electronically signed by: Lynwood Seip MD 08/16/2024 03:40 PM EST RP Workstation: [id number]   US  ASCITES (ABDOMEN LIMITED) Result Date: 08/15/2024 CLINICAL DATA:  Patient with history of cholangiocarcinoma with malignant ascites, recently started on diuretic medication. Request for possible therapeutic paracentesis. EXAM: LIMITED ABDOMEN ULTRASOUND FOR ASCITES TECHNIQUE: Limited ultrasound survey for ascites was performed in all four abdominal quadrants. COMPARISON:  None Available. FINDINGS: Limited abdominal ultrasound of all four abdominal quadrants shows very small midline right upper quadrant pocket of ascites. No other areas of peritoneal fluid seen. IMPRESSION: Very small midline right upper quadrant pocket of ascites, not amenable to safe paracentesis. No procedure performed. Electronically Signed   By: Wilkie Lent M.D.   On: 08/15/2024 14:08    ROS:  Pertinent items are noted in HPI.  Blood pressure 135/80, pulse 98, temperature 97.8 F (36.6 C), temperature source Oral, resp. rate 20, height 6' (1.829 m), weight 115.7 kg, SpO2 93%. Physical Exam: Pleasant well-developed and well-nourished white male in no acute distress. Head is normocephalic, atraumatic Lungs clear to auscultation with equal breath sounds bilaterally Heart examination reveals a regular rate and rhythm without S3, S4, murmurs Abdomen is distended but not necessarily rigid.  No peritoneal signs are noted.   No point tenderness is noted.  No hernias are noted.  CT scan images personally reviewed Cardiology and oncology notes reviewed  Assessment/Plan: Impression: Ischemia of small bowel with pneumatosis possibly secondary to carcinomatosis, chemotherapy induced ileus, or adhesive disease.  He has extensive dilatation of the small bowel which is worrisome for impending dead gut.  He does not have evidence of perforation at the present time.  He has been taking his Brilinta  due to his recent coronary drug-eluting stent placement.  I discussed with the patient that this could be a life ending event.  Options included conservative management including NG tube placement, IV antibiotic, IV hydration, and pain control versus surgical intervention.  He was told that surgical intervention may be extensive and may not reverse the bowel ischemia course.  He does not have an established DNR status at the present time.  He would like to try surgical intervention.  Given his multiple comorbidities, I discussed his situation with Dr. Herlene Bureau at [facility].  He has accepted transfer of the patient and would like to be consulted once the hospitalist have transferred the patient to the hospital service at [facility].  Have ordered NG tube placement.  Already started on Zosyn .  Continue IV hydration.  Oneil Budge 08/16/2024, 5:13 PM

## 2024-08-16 NOTE — ED Notes (Signed)
 [facility] called for transportation of pt to [place]

## 2024-08-16 NOTE — ED Triage Notes (Signed)
 Pt with abd cramping for past several days and one emesis today that was black in color per pt. + generalized weakness   Pt on chemotherapy for liver CA

## 2024-08-17 DIAGNOSIS — K559 Vascular disorder of intestine, unspecified: Secondary | ICD-10-CM | POA: Diagnosis not present

## 2024-08-17 LAB — COMPREHENSIVE METABOLIC PANEL WITH GFR
ALT: 87 U/L — ABNORMAL HIGH (ref 0–44)
AST: 107 U/L — ABNORMAL HIGH (ref 15–41)
Albumin: 2.2 g/dL — ABNORMAL LOW (ref 3.5–5.0)
Alkaline Phosphatase: 122 U/L (ref 38–126)
Anion gap: 11 (ref 5–15)
BUN: 23 mg/dL (ref 8–23)
CO2: 28 mmol/L (ref 22–32)
Calcium: 8.3 mg/dL — ABNORMAL LOW (ref 8.9–10.3)
Chloride: 99 mmol/L (ref 98–111)
Creatinine, Ser: 1.67 mg/dL — ABNORMAL HIGH (ref 0.61–1.24)
GFR, Estimated: 44 mL/min — ABNORMAL LOW (ref >60)
Glucose, Bld: 125 mg/dL — ABNORMAL HIGH (ref 70–99)
Potassium: 3.3 mmol/L — ABNORMAL LOW (ref 3.5–5.1)
Sodium: 138 mmol/L (ref 135–145)
Total Bilirubin: 1.2 mg/dL (ref 0.0–1.2)
Total Protein: 5.3 g/dL — ABNORMAL LOW (ref 6.5–8.1)

## 2024-08-17 LAB — GLUCOSE, CAPILLARY
Glucose-Capillary: 139 mg/dL — ABNORMAL HIGH (ref 70–99)
Glucose-Capillary: 162 mg/dL — ABNORMAL HIGH (ref 70–99)
Glucose-Capillary: 96 mg/dL (ref 70–99)
Glucose-Capillary: 82 mg/dL (ref 70–99)

## 2024-08-17 LAB — CBC
HCT: 32.5 % — ABNORMAL LOW (ref 39.0–52.0)
Hemoglobin: 10.8 g/dL — ABNORMAL LOW (ref 13.0–17.0)
MCH: 30.4 pg (ref 26.0–34.0)
MCHC: 33.2 g/dL (ref 30.0–36.0)
MCV: 91.5 fL (ref 80.0–100.0)
Platelets: 170 K/uL (ref 150–400)
RBC: 3.55 MIL/uL — ABNORMAL LOW (ref 4.22–5.81)
RDW: 18.7 % — ABNORMAL HIGH (ref 11.5–15.5)
WBC: 2.2 K/uL — ABNORMAL LOW (ref 4.0–10.5)
nRBC: 1.4 % — ABNORMAL HIGH (ref 0.0–0.2)

## 2024-08-17 LAB — LACTIC ACID, PLASMA
Lactic Acid, Venous: 1.5 mmol/L (ref 0.5–1.9)
Lactic Acid, Venous: 1.5 mmol/L (ref 0.5–1.9)

## 2024-08-17 MED ORDER — METOCLOPRAMIDE HCL 5 MG/ML IJ SOLN
5.0000 mg | Freq: Four times a day (QID) | INTRAMUSCULAR | Status: DC | PRN
  Administered 2024-08-17: 5 mg via INTRAVENOUS
  Filled 2024-08-17: qty 2

## 2024-08-17 MED ORDER — CHLORHEXIDINE GLUCONATE CLOTH 2 % EX PADS
6.0000 | MEDICATED_PAD | Freq: Every day | CUTANEOUS | Status: DC
  Administered 2024-08-18 – 2024-08-23 (×6): 6 via TOPICAL

## 2024-08-17 MED ORDER — POTASSIUM CHLORIDE 10 MEQ/100ML IV SOLN
10.0000 meq | INTRAVENOUS | Status: AC
  Administered 2024-08-17 (×4): 10 meq via INTRAVENOUS
  Filled 2024-08-17 (×4): qty 100

## 2024-08-17 MED ORDER — HYDRALAZINE HCL 20 MG/ML IJ SOLN: 10.0000 mg | Freq: Four times a day (QID) | INTRAMUSCULAR | Status: DC | PRN

## 2024-08-17 NOTE — Progress Notes (Addendum)
 PROGRESS NOTE  Edmund Holcomb FMW:[id number] DOB: 07-07-1956 DOA: 08/16/2024 PCP: [name], MD   LOS: 1 day   Brief narrative:  Marvin Carr is a 68 y.o. 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  In the ED CT angiogram showed severely dilated small bowel loops with intramural gas, consistent with ischemic bowel disease.  Low-density lesions consistent with metastatic disease with irregular omental densities concerning for peritoneal carcinomatosis.  Urinalysis was unremarkable lipase was 20.  Hemoglobin of 11.0 with creatinine of 1.1.  ED provider spoke with on-call general surgery and patient was admitted hospital for further evaluation.     Assessment/Plan: Principal Problem:   Ischemic bowel syndrome Active Problems:   Essential hypertension   Peritoneal carcinomatosis (HCC)   Cholangiocarcinoma metastatic to liver (HCC)   IDA (iron deficiency anemia)   S/P primary angioplasty with coronary stent   Ischemic bowel disease  Concern for ischemic bowel/bowel obstruction -CT scan consistent with ischemic bowel.   General surgery on board and recommend treatment for bowel obstruction.  Continue IV Zosyn  Protonix  n.p.o. IV fluids antiemetics.  General surgery on board and recommend NG tube and bowel rest.   Metastatic cholangiocarcinoma  Diagnosed in 2022 with peritoneal carcinomatosis and metastatic disease to the liver and bile duct. Patient was initially initially treated with cisplatin , gemcitabine , and durvalumab  for 6 months despite that had disease progression in 2025, treatment delayed by hip replacement in  May 2025.  He was transitioned to FOLFOX 06/2024.  High risk for any surgical intervention.   History of CAD s/p PCI to the Lcx (stent placed 07/04/24) patient was on Brilinta /aspirin  as outpatient with plan to continue dual antiplatelet therapy for at least 6 months but preferably 1 year with aspirin  and Brilinta  followed by Brilinta  monotherapy indefinitely.  Lipitor on hold.  Continue metoprolol .  Cardiology on board and at this time recommended aspirin  suppository while holding Brilinta  due to heme positive aspirate.   HFpEF  review of 2D echocardiogram from  07/03/2024 with EF of 60 to 65%, grade 1 diastolic dysfunction noted.  Diuretics on hold.   Diabetes mellitus type 2.-Recent hemoglobin A1C is 5.7 reflecting excellent diabetic control PTA.  Continue to hold metformin  and Farxiga .  Continue sliding scale insulin  Accu-Cheks.  Currently NPO.  Mild hypokalemia.  Will replace with IV KCl 40 mill equivalents.  Check levels in AM.   Essential hypertension-patient is on Procardia  and metoprolol  at home..  Currently n.p.o./NG tube.   Mild anemia leukopenia will continue to monitor.  Check CBC in AM.   Goals of care.  Palliative care on board.  DNR. DVT prophylaxis: heparin  injection 5,000 Units Start: 08/16/24 2200 SCDs Start: 08/16/24 1848 Place TED hose Start: 08/16/24 1848   Disposition: Uncertain at this time.  Likely home with 2 to 3 days  Status is: Inpatient Remains inpatient appropriate because: Pending clinical improvement,    Code Status:     Code Status: Limited: Do not attempt resuscitation (DNR) -DNR-LIMITED -Do Not Intubate/DNI   Family Communication: Spoke with the patient's friend at bedside.  Consultants: General Surgery Cardiology  Procedures: NG tube placement  Anti-infectives:  Zosyn  IV  Anti-infectives (From admission, onward)  Start     Dose/Rate Route Frequency Ordered Stop   08/17/24 0000  piperacillin -tazobactam (ZOSYN ) IVPB 3.375 g         3.375 g 12.5 mL/hr over 240 Minutes Intravenous Every 8 hours 08/16/24 1844     08/16/24 1545  piperacillin -tazobactam (ZOSYN ) IVPB 3.375 g        3.375 g 100 mL/hr over 30 Minutes Intravenous  Once 08/16/24 1543 08/16/24 1800        Subjective: Today, patient was seen and examined at bedside.  Patient denies any nausea vomiting and feels a little better after placement of NG tube.  Denies any flatus or bowel movement.  Denies any fever chills shortness of breath or dyspnea.  Patient's friend at bedside.  Objective: Vitals:   08/17/24 0236 08/17/24 0943  BP: 93/66 110/81  Pulse: (!) 117 (!) 106  Resp: 15 18  Temp: 98.3 F (36.8 C) 97.6 F (36.4 C)  SpO2: 96% 96%    Intake/Output Summary (Last 24 hours) at 08/17/2024 1137 Last data filed at 08/17/2024 0800 Gross per 24 hour  Intake 564.28 ml  Output 2350 ml  Net -1785.72 ml   Filed Weights   08/16/24 1258  Weight: 115.7 kg   Body mass index is 34.58 kg/m.   Physical Exam: GENERAL: Patient is alert awake and oriented. Not in obvious distress.  Obese built, NG tube in place HENT: No scleral pallor or icterus. Pupils equally reactive to light. Oral mucosa is moist NECK: is supple, no gross swelling noted. CHEST: Diminished breath sounds bilaterally CVS: S1 and S2 heard, no murmur. Regular rate and rhythm.  ABDOMEN: Soft, nonspecific tenderness, mild distention, EXTREMITIES: No edema. CNS: Cranial nerves are intact. No focal motor deficits. SKIN: warm and dry without rashes.  Data Review: I have personally reviewed the following laboratory data and studies,  CBC: Recent Labs  Lab 08/12/24 1223 08/16/24 1325 08/17/24 0208  WBC 7.1 3.0* 2.2*  NEUTROABS 5.2 1.9  --   HGB 9.6* 11.0* 10.8*  HCT 30.5* 34.0* 32.5*  MCV 96.5 95.2 91.5  PLT 173 178 170   Basic Metabolic Panel: Recent Labs  Lab 08/12/24 1223 08/16/24 1325 08/17/24 0208  NA 137 136 138  K 3.4* 3.6 3.3*  CL 99 98 99  CO2 24 22 28   GLUCOSE 140*  147* 125*  BUN 19 20 23   CREATININE 1.17 1.17 1.67*  CALCIUM  8.6* 9.1 8.3*  MG 1.4*  --   --    Liver Function Tests: Recent Labs  Lab 08/12/24 1223 08/16/24 1325 08/17/24 0208  AST 25 100* 107*  ALT 17 75* 87*  ALKPHOS 90 141* 122  BILITOT 0.3 0.9 1.2  PROT 5.9* 6.2* 5.3*  ALBUMIN  3.4* 3.3* 2.2*   Recent Labs  Lab 08/16/24 1325  LIPASE 20   No results for input(s): AMMONIA in the last 168 hours. Cardiac Enzymes: No results for input(s): CKTOTAL, CKMB, CKMBINDEX, TROPONINI in the last 168 hours. BNP (last 3 results) Recent Labs    05/15/24 1758  BNP 115.0*    ProBNP (last 3 results) Recent Labs    07/03/24 0023  PROBNP 218.0    CBG: Recent Labs  Lab 08/16/24 2214 08/17/24 0610  GLUCAP 132* 139*   No results found for this or any previous visit (from the past 240 hours).   Studies: DG Abd 1 View Result Date: 08/17/2024 EXAM: 1 VIEW XRAY OF THE ABDOMEN 08/17/2024 12:28:00 AM COMPARISON: None available. CLINICAL HISTORY: Encounter for nasogastric (NG)  tube placement. FINDINGS: LINES, TUBES AND DEVICES: Enteric tube courses below the hemidiaphragm with the tip overlying the gastric lumen and side port not well visualized, likely in the region of the gastroesophageal junction. BOWEL: Nonobstructive bowel gas pattern. SOFT TISSUES: No abnormal calcifications. BONES: No acute fracture. LIMITATIONS: Right flank and pelvis collimated off view. IMPRESSION: 1. Enteric tube tip overlies the gastric lumen; side port is not well visualized and is likely at the gastroesophageal junction, consider advancing the tube slightly to ensure the side port is intragastric. Electronically signed by: Morgane Naveau MD 08/17/2024 12:33 AM EST RP Workstation: [id number]   CT ANGIO GI BLEED Result Date: 08/16/2024 EXAM: CTA ABDOMEN AND PELVIS WITH CONTRAST 08/16/2024 03:23:46 PM TECHNIQUE: CTA images of the abdomen and pelvis with intravenous contrast. 100 mL iohexol  (OMNIPAQUE )  350 MG/ML injection. Three-dimensional MIP/volume rendered formations were performed. Automated exposure control, iterative reconstruction, and/or weight based adjustment of the mA/kV was utilized to reduce the radiation dose to as low as reasonably achievable. COMPARISON: 05/15/2024 and 06/03/2024. CLINICAL HISTORY: Suspected GI bleed. FINDINGS: VASCULATURE: GI BLEED: No active extravasation of contrast within the GI tract. AORTA: No acute finding. No abdominal aortic aneurysm. No dissection. CELIAC TRUNK: No acute finding. No occlusion or significant stenosis. SUPERIOR MESENTERIC ARTERY: No acute finding. No occlusion or significant stenosis. INFERIOR MESENTERIC ARTERY: No acute finding. No occlusion or significant stenosis. RENAL ARTERIES: No acute finding. No occlusion or significant stenosis. ILIAC ARTERIES: No acute finding. No occlusion or significant stenosis. ABDOMEN/PELVIS: LOWER CHEST: Visualized portion of the lower chest demonstrates no acute abnormality. LIVER: Hepatic low density is again noted consistent with metastatic disease. GALLBLADDER AND BILE DUCTS: Cholelithiasis. No biliary ductal dilatation. SPLEEN: The spleen is unremarkable. PANCREAS: The pancreas is unremarkable. ADRENAL GLANDS: Bilateral adrenal glands demonstrate no acute abnormality. KIDNEYS, URETERS AND BLADDER: No stones in the kidneys or ureters. No hydronephrosis. No perinephric or periureteral stranding. Urinary bladder is unremarkable. GI AND BOWEL: Severely dilated small bowel loops are noted with intramural gas in multiple small bowel loops consistent with ischemic bowel disease. No colonic dilatation is noted. Stomach and duodenal sweep demonstrate no acute abnormality. REPRODUCTIVE: Reproductive organs are unremarkable. PERITONEUM AND RETROPERITONEUM: Mild ascites is noted. 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 No free air. LYMPH NODES: No lymphadenopathy. BONES AND SOFT TISSUES: Status  post bilateral hip arthroplasties. No acute abnormality of the bones. No acute soft tissue abnormality. IMPRESSION: 1. Severely dilated small bowel loops with intramural gas, consistent with ischemic bowel disease. This finding was discussed with Dr. Melvenia at 3:38 pm on 08/16/2024. 2. No active gastrointestinal bleeding identified. 3. Hepatic low-density lesions consistent with metastatic disease. 4. 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 Electronically signed by: Lynwood Seip MD 08/16/2024 03:40 PM EST RP Workstation: [id number]   US  ASCITES (ABDOMEN LIMITED) Result Date: 08/15/2024 CLINICAL DATA:  Patient with history of cholangiocarcinoma with malignant ascites, recently started on diuretic medication. Request for possible therapeutic paracentesis. EXAM: LIMITED ABDOMEN ULTRASOUND FOR ASCITES TECHNIQUE: Limited ultrasound survey for ascites was performed in all four abdominal quadrants. COMPARISON:  None Available. FINDINGS: Limited abdominal ultrasound of all four abdominal quadrants shows very small midline right upper quadrant pocket of ascites. No other areas of peritoneal fluid seen. IMPRESSION: Very small midline right upper quadrant pocket of ascites, not amenable to safe paracentesis. No procedure performed. Electronically Signed   By: Wilkie Lent M.D.   On: 08/15/2024 14:08      Vernal Alstrom, MD  [facility] 08/17/2024  If 7PM-7AM, please contact night-coverage

## 2024-08-17 NOTE — Plan of Care (Signed)
  Problem: Education: Goal: Knowledge of General Education information will improve Description: Including pain rating scale, medication(s)/side effects and non-pharmacologic comfort measures Outcome: Progressing   Problem: Activity: Goal: Risk for activity intolerance will decrease Outcome: Progressing   Problem: Elimination: Goal: Will not experience complications related to urinary retention Outcome: Progressing   Problem: Skin Integrity: Goal: Risk for impaired skin integrity will decrease Outcome: Progressing

## 2024-08-17 NOTE — Progress Notes (Signed)
 Subjective/Chief Complaint: Feels better with ng   Objective: Vital signs in last 24 hours: Temp:  [97.1 F (36.2 C)-98.3 F (36.8 C)] 98.3 F (36.8 C) (12/07 0236) Pulse Rate:  [96-121] 117 (12/07 0236) Resp:  [15-31] 15 (12/07 0236) BP: (93-145)/(66-88) 93/66 (12/07 0236) SpO2:  [91 %-96 %] 96 % (12/07 0236) Weight:  [115.7 kg] 115.7 kg (12/06 1258) Last BM Date : 08/16/24  Intake/Output from previous day: 12/06 0701 - 12/07 0700 In: 444.3 [I.V.:399.7; IV Piggyback:44.6] Out: 2200 [Urine:450; Emesis/NG output:1750] Intake/Output this shift: No intake/output data recorded.  General appearance: alert and cooperative Resp: clear to auscultation bilaterally Cardio: regular rate and rhythm GI: soft, minimal tenderness  Lab Results:  Recent Labs    08/16/24 1325 08/17/24 0208  WBC 3.0* 2.2*  HGB 11.0* 10.8*  HCT 34.0* 32.5*  PLT 178 170   BMET Recent Labs    08/16/24 1325 08/17/24 0208  NA 136 138  K 3.6 3.3*  CL 98 99  CO2 22 28  GLUCOSE 147* 125*  BUN 20 23  CREATININE 1.17 1.67*  CALCIUM  9.1 8.3*   PT/INR Recent Labs    08/16/24 1325  LABPROT 15.1  INR 1.1   ABG No results for input(s): PHART, HCO3 in the last 72 hours.  Invalid input(s): PCO2, PO2  Studies/Results: DG Abd 1 View Result Date: 08/17/2024 EXAM: 1 VIEW XRAY OF THE ABDOMEN 08/17/2024 12:28:00 AM COMPARISON: None available. CLINICAL HISTORY: Encounter for nasogastric (NG) tube placement. FINDINGS: LINES, TUBES AND DEVICES: Enteric tube courses below the hemidiaphragm with the tip overlying the gastric lumen and side port not well visualized, likely in the region of the gastroesophageal junction. BOWEL: Nonobstructive bowel gas pattern. SOFT TISSUES: No abnormal calcifications. BONES: No acute fracture. LIMITATIONS: Right flank and pelvis collimated off view. IMPRESSION: 1. Enteric tube tip overlies the gastric lumen; side port is not well visualized and is likely at the  gastroesophageal junction, consider advancing the tube slightly to ensure the side port is intragastric. Electronically signed by: Morgane Naveau MD 08/17/2024 12:33 AM EST RP Workstation: [id number]   CT ANGIO GI BLEED Result Date: 08/16/2024 EXAM: CTA ABDOMEN AND PELVIS WITH CONTRAST 08/16/2024 03:23:46 PM TECHNIQUE: CTA images of the abdomen and pelvis with intravenous contrast. 100 mL iohexol  (OMNIPAQUE ) 350 MG/ML injection. Three-dimensional MIP/volume rendered formations were performed. Automated exposure control, iterative reconstruction, and/or weight based adjustment of the mA/kV was utilized to reduce the radiation dose to as low as reasonably achievable. COMPARISON: 05/15/2024 and 06/03/2024. CLINICAL HISTORY: Suspected GI bleed. FINDINGS: VASCULATURE: GI BLEED: No active extravasation of contrast within the GI tract. AORTA: No acute finding. No abdominal aortic aneurysm. No dissection. CELIAC TRUNK: No acute finding. No occlusion or significant stenosis. SUPERIOR MESENTERIC ARTERY: No acute finding. No occlusion or significant stenosis. INFERIOR MESENTERIC ARTERY: No acute finding. No occlusion or significant stenosis. RENAL ARTERIES: No acute finding. No occlusion or significant stenosis. ILIAC ARTERIES: No acute finding. No occlusion or significant stenosis. ABDOMEN/PELVIS: LOWER CHEST: Visualized portion of the lower chest demonstrates no acute abnormality. LIVER: Hepatic low density is again noted consistent with metastatic disease. GALLBLADDER AND BILE DUCTS: Cholelithiasis. No biliary ductal dilatation. SPLEEN: The spleen is unremarkable. PANCREAS: The pancreas is unremarkable. ADRENAL GLANDS: Bilateral adrenal glands demonstrate no acute abnormality. KIDNEYS, URETERS AND BLADDER: No stones in the kidneys or ureters. No hydronephrosis. No perinephric or periureteral stranding. Urinary bladder is unremarkable. GI AND BOWEL: Severely dilated small bowel loops are noted with intramural gas in  multiple  small bowel loops consistent with ischemic bowel disease. No colonic dilatation is noted. Stomach and duodenal sweep demonstrate no acute abnormality. REPRODUCTIVE: Reproductive organs are unremarkable. PERITONEUM AND RETROPERITONEUM: Mild ascites is noted. 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 No free air. LYMPH NODES: No lymphadenopathy. BONES AND SOFT TISSUES: Status post bilateral hip arthroplasties. No acute abnormality of the bones. No acute soft tissue abnormality. IMPRESSION: 1. Severely dilated small bowel loops with intramural gas, consistent with ischemic bowel disease. This finding was discussed with Dr. Melvenia at 3:38 pm on 08/16/2024. 2. No active gastrointestinal bleeding identified. 3. Hepatic low-density lesions consistent with metastatic disease. 4. 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 Electronically signed by: Lynwood Seip MD 08/16/2024 03:40 PM EST RP Workstation: [id number]   US  ASCITES (ABDOMEN LIMITED) Result Date: 08/15/2024 CLINICAL DATA:  Patient with history of cholangiocarcinoma with malignant ascites, recently started on diuretic medication. Request for possible therapeutic paracentesis. EXAM: LIMITED ABDOMEN ULTRASOUND FOR ASCITES TECHNIQUE: Limited ultrasound survey for ascites was performed in all four abdominal quadrants. COMPARISON:  None Available. FINDINGS: Limited abdominal ultrasound of all four abdominal quadrants shows very small midline right upper quadrant pocket of ascites. No other areas of peritoneal fluid seen. IMPRESSION: Very small midline right upper quadrant pocket of ascites, not amenable to safe paracentesis. No procedure performed. Electronically Signed   By: Wilkie Lent M.D.   On: 08/15/2024 14:08    Anti-infectives: Anti-infectives (From admission, onward)    Start     Dose/Rate Route Frequency Ordered Stop   08/17/24 0000  piperacillin -tazobactam (ZOSYN ) IVPB 3.375 g         3.375 g 12.5 mL/hr over 240 Minutes Intravenous Every 8 hours 08/16/24 1844     08/16/24 1545  piperacillin -tazobactam (ZOSYN ) IVPB 3.375 g        3.375 g 100 mL/hr over 30 Minutes Intravenous  Once 08/16/24 1543 08/16/24 1800       Assessment/Plan: s/p * No surgery found * Continue ng and bowel rest If the obstruction is malignant then it is unlikely to resolve and given the nature of his metastatic disease it is unlikely we can fix this with an operation. In that case he may benefit from palliative g tube but this would be for comfort only.  If the obstruction does open up with bowel rest then at that point we could consider removing ng and increasing diet Agree with palliative care consult Will follow  LOS: 1 day    Marvin Carr III 08/17/2024

## 2024-08-17 NOTE — Progress Notes (Signed)
 Verified from MD [facility] no further advancing  of NG tube  is required ,marked at 58cm, kept at low intermittent suction.

## 2024-08-17 NOTE — Consult Note (Signed)
 Palliative Medicine Inpatient Consult Note  Consulting Provider: Pearlean Manus, MD   Reason for consult:   Palliative Care Consult Services Palliative Medicine Consult  Reason for Consult? GOC   08/17/2024  HPI:  Per intake H&P -->  Marvin Carr is a 68 y.o. 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 Palliative care has been requested to support additional goals of care conversations.   Clinical Assessment/Goals of Care:  *Please note that this is a verbal dictation therefore any spelling or grammatical errors are due to the Dragon Medical One system interpretation.  I have reviewed medical records including EPIC notes, labs and imaging, received report from bedside RN, assessed the patient who is sitting up at the side of the bed with a nasogastric tube in place in no apparent distress.    I met with Marvin Carr to further discuss diagnosis prognosis, GOC, EOL wishes, disposition and options.   I introduced Palliative Medicine as specialized medical care for people living with serious illness. It focuses on providing relief from the symptoms and stress of a serious illness. The goal is to improve quality of life for both the patient and the family.  Medical History Review and Understanding:  A review of Marvin Carr's past medical history significant for metastatic cholangiocarcinoma diagnosed in 09-02-21, hypertension, hyperlipidemia, coronary artery disease, congestive heart failure, depression, sleep apnea, and neuropathy was completed.  Social History:  Marvin Carr shares that he is from [place], [place] .  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  He shares that he was able to work for [employer] during his years away.  He moved back to [place] in 2010-09-02 when his mother and father became ill.  Marvin Carr acted as his parents primary caregiver.  He shares that his mother died in 09-03-2011 and father in September 03, 2019.  Marvin Carr expresses that he has an aunt Marvin Carr and cousin Marvin Carr with whom he has a strong relationship.  Marvin Carr is not married and does not have children.  Marvin Carr is a man of faith practicing within Christianity.  Functional and Nutritional State:  Preceding hospitalization Marvin Carr was living by himself in a single-family home.  He shares he could attend to B ADLs and IADLs.  He has had able to eat well preceding symptoms of recent hospitalization.  Palliative Symptoms:  Nausea is improved with decompression of nasogastric tube.  Abdominal pain is also improved with nasogastric tube decompression.  Patient shares he has episodes of blackouts when he does not sleep well he can often feel it is coming along and knows what he has to do which is typically to take it easy on the days when he anticipates this is going to happen he also shares as a safety he will not drive when he feels this is going to happen.  Advance Directives:  A detailed discussion was had today regarding advanced directives.  Marvin Carr shares that he does have a will and his cousin, Marvin Carr is listed to help with his affairs.  He does not feel though that he has a healthcare power of attorney at this time.  Code Status:  Concepts specific to code status,  artifical feeding and hydration, continued IV antibiotics and rehospitalization was had.  The difference between a aggressive medical intervention path  and a palliative comfort care path for this patient at this time was had.   Encouraged patient/family to consider DNR/DNI status understanding evidenced based poor outcomes in similar hospitalized patient, as  the cause of arrest is likely associated with advanced chronic/terminal illness rather than an easily reversible acute cardio-pulmonary event. I explained that DNR/DNI does not change the medical plan and it only comes into effect after a person has arrested (died).  It is a protective measure to keep us  from harming the patient in their last moments of life.  Marvin Carr was agreeable to DNR/DNI with understanding that patient would not receive CPR, defibrillation, ACLS medications, or intubation.   Discussion:  Marvin Carr and I reviewed the reason for hospitalization inclusive of his abdominal pain.  We discussed his current clinical status and the obstructive process which is occurring.  We reviewed the concerns associated if it is malignant.  We reviewed the thought of a venting gastrostomy tube if that is the reality.  We also discussed the concerns associated with long-term outcomes if if a malignant obstruction is the case and how if it is the best next step may be to consider hospice care.  I described hospice as a service for patients who have a life expectancy of 6 months or less. The goal of hospice is the preservation of dignity and quality at the end phases of life. Under hospice care, the focus changes from curative to symptom relief.  Marvin Carr shares understanding of hospice as his father underwent hospice care also.  We reviewed the importance of putting all the puzzle pieces together with both the cardiology, surgery, and oncology teams.  Marvin Carr shares he has had a difficult time with oncology at [facility] and is now found Marvin Carr with who he has a trust and confidence in him.  He and I discussed reaching out to her to gain further insights on her thoughts of what the next best steps would be.  I allowed time and space for Marvin Carr to express a variety of grievances related to the care provided at [facility].  I shared with him that I will follow-up with the patient relations services  to gain additional insights on how patient care can be improved at [facility].  We reviewed this case and worst-case scenarios moving into the future.  We reviewed the importance of additional insights from the multidisciplinary teams to determine the best path moving forward.  Marvin Carr and I reflected on the life he is led and the time he spent being selfless caring for his parents.  Marvin Carr does share he has a strong support system as he is various friends who live within close proximity who can show up to provide care to him if needed.  Discussed the importance of continued conversation with family and their  medical providers regarding overall plan of care and treatment options, ensuring decisions are within the context of the patients values and GOCs.  Decision Maker: Marvin Carr is able to make decisions for himself  SUMMARY OF RECOMMENDATIONS   DNAR/DNI  Allowing time for outcomes  Appreciate the insights of Dr. [name] on Marvin Carr's clinical course to help determine the best path moving forward from an Oncological perspective  I will reach out to the [facility] patient relations department tomorrow morning to endorse patient concerns about the care which has been provided  Ongoing  palliative care support during hospitalization  Code Status/Advance Care Planning: DNAR/DNI  Palliative Prophylaxis:  Aspiration, Bowel Regimen, Delirium Protocol, Frequent Pain Assessment, Oral Care, Palliative Wound Care, and Turn Reposition  Additional Recommendations (Limitations, Scope, Preferences): Ongoing care allowing time for outcomes  Psycho-social/Spiritual:  Desire for further Chaplaincy support: Yes Additional Recommendations: Education on cancer possible progression   Prognosis: Defer to oncology though I worry it is limited as per review of prior notes.  Discharge Planning: Discharge plan to be determined.   Vitals:   08/16/24 2209 08/17/24 0236  BP: 137/80 93/66  Pulse:  (!) 120 (!) 117  Resp: 20 15  Temp: (!) 97.1 F (36.2 C) 98.3 F (36.8 C)  SpO2: 92% 96%    Intake/Output Summary (Last 24 hours) at 08/17/2024 0727 Last data filed at 08/17/2024 0400 Gross per 24 hour  Intake 444.28 ml  Output 2200 ml  Net -1755.72 ml   Last Weight  Most recent update: 08/16/2024 12:58 PM    Weight  115.7 kg (255 lb)            LABS: CBC:    Component Value Date/Time   WBC 2.2 (L) 08/17/2024 0208   HGB 10.8 (L) 08/17/2024 0208   HCT 32.5 (L) 08/17/2024 0208   PLT 170 08/17/2024 0208   MCV 91.5 08/17/2024 0208   NEUTROABS 1.9 08/16/2024 1325   LYMPHSABS 0.4 (L) 08/16/2024 1325   MONOABS 0.7 08/16/2024 1325   EOSABS 0.0 08/16/2024 1325   BASOSABS 0.0 08/16/2024 1325   Comprehensive Metabolic Panel:    Component Value Date/Time   NA 138 08/17/2024 0208   K 3.3 (L) 08/17/2024 0208   CL 99 08/17/2024 0208   CO2 28 08/17/2024 0208   BUN 23 08/17/2024 0208   CREATININE 1.67 (H) 08/17/2024 0208   GLUCOSE 125 (H) 08/17/2024 0208   CALCIUM  8.3 (L) 08/17/2024 0208   AST 107 (H) 08/17/2024 0208   ALT 87 (H) 08/17/2024 0208   ALKPHOS 122 08/17/2024 0208   BILITOT 1.2 08/17/2024 0208   PROT 5.3 (L) 08/17/2024 0208   ALBUMIN  2.2 (L) 08/17/2024 0208   Gen: Elderly Caucasian male HEENT: moist mucous membranes CV: Regular rate and rhythm  PULM: On 4 L nasal cannula ABD: Soft  EXT: No edema  Neuro: Alert and oriented x3   PPS: 50-60%   This conversation/these recommendations were discussed with patient primary care team, Dr. Sonjia ______________________________________________________ Rosaline Becton [facility] Palliative Medicine Team Team Cell Phone: [phone] Please utilize secure chat with additional questions, if there is no response within 30 minutes please call the above phone number  Total Time: 75 Billing based on MDM: High  Palliative Medicine Team providers are available by phone from 7am to 7pm daily and can be reached  through the team cell phone.  Should this patient require assistance outside of these hours, please call the patient's attending physician.

## 2024-08-17 NOTE — Consult Note (Signed)
 CARDIOLOGY CONSULT NOTE       Patient ID: Marvin Carr MRN: [id number] DOB/AGE: 1956-08-25 68 y.o.  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 admitted with with abdominal pain and coffee ground emesis. This is on background of metastatic cholangiocarcinoma diagnosed in 2022 with peritoneal carcinomatosis and mets to liver. He is palliative patient. 07/04/24 had angina with cath showing small D1 95% Rx medically and received a stent to mid LCX for 80% stenosis. Stent was good size 3.5 mm and post dilated to 3.75 mm. He has been on ASA and Brillinta. No angina Echo 10/23 with normal EF 60-65% no significant valve dx and aortic root 41 mm. CT of abdomen showed severely dilated small bowel loops with intramural gas consistent with ischemic bowel no active gastrointestinal bleeding identified Mets to liver and omentum. He currently has NG tube in with heme positive gastric contents Current Hct 10/8 with PLT 170.   ROS All other systems reviewed and negative except as noted above  Past Medical History:  Diagnosis Date   Anxiety    Arthritis    Bladder cancer (HCC) 09/11/2009   CAD (coronary artery disease), native coronary artery    a. Mildly elevated troponin 03/2013, cath with nonobstructive disease including 50% AV groove distal stenosis before large OM   Depression    Essential hypertension    Headache(784.0)    History of migraines   Hyperglycemia    Metastatic cancer to liver (HCC)    Mixed hyperlipidemia    Neuropathy    Obesity    Port-A-Cath in place  09/30/2021   Pre-diabetes    Seasonal allergies    Sleep apnea    On CPAP    Family History  Problem Relation Age of Onset   Cancer Father        MDS   COPD Father    Cancer Paternal Grandfather        prostate cancer    Social History   Socioeconomic History   Marital status: Divorced    Spouse name: Not on file   Number of children: 0   Years of education: college   Highest education level: Not on file  Occupational History    Employer: SELF-EMPLOYED  Tobacco Use   Smoking status: Former    Current packs/day: 0.00    Average packs/day: 0.5 packs/day for 10.0 years (5.0 ttl pk-yrs)    Types: Cigarettes    Start date: 07/09/2001    Quit date: 07/10/2011    Years since quitting: 13.1    Passive exposure: Past   Smokeless tobacco: Never   Tobacco comments:    Quit several yrs prior to 03/2013.  Vaping Use   Vaping status: Never Used  Substance and Sexual Activity   Alcohol use: Yes    Alcohol/week: 0.0 standard drinks of alcohol    Comment: Occasional   Drug use: No   Sexual activity: Not on file  Other Topics Concern   Not on file  Social History Narrative   Not on file   Social Drivers of Health   Financial Resource Strain: Not on file  Food Insecurity:  No Food Insecurity (08/17/2024)   Hunger Vital Sign    Worried About Running Out of Food in the Last Year: Never true    Ran Out of Food in the Last Year: Never true  Transportation Needs: No Transportation Needs (08/17/2024)   PRAPARE - Administrator, Civil Service (Medical): No    Lack of Transportation (Non-Medical): No  Physical Activity: Not on file  Stress: Not on file  Social Connections: Moderately Isolated (08/17/2024)   Social Connection and Isolation Panel    Frequency of Communication with Friends and Family: Three times a week    Frequency of Social Gatherings with Friends and Family: More than three times a week    Attends Religious Services: More than 4 times per year    Active  Member of Clubs or Organizations: No    Attends Banker Meetings: Never    Marital Status: Divorced  Catering Manager Violence: Not At Risk (08/17/2024)   Humiliation, Afraid, Rape, and Kick questionnaire    Fear of Current or Ex-Partner: No    Emotionally Abused: No    Physically Abused: No    Sexually Abused: No    Past Surgical History:  Procedure Laterality Date   BIOPSY  09/23/2021   Procedure: BIOPSY;  Surgeon: Eartha Angelia Sieving, MD;  Location: [facility];  Service: Gastroenterology;;   COLONOSCOPY  06/28/2011   Procedure: COLONOSCOPY;  Surgeon: Claudis RAYMOND Rivet, MD;  Location: [facility];  Service: Endoscopy;  Laterality: N/A;   COLONOSCOPY WITH PROPOFOL  N/A 09/23/2021   Procedure: COLONOSCOPY WITH PROPOFOL ;  Surgeon: Eartha Angelia Sieving, MD;  Location: [facility];  Service: Gastroenterology;  Laterality: N/A;  940   CORONARY STENT INTERVENTION N/A 07/04/2024   Procedure: CORONARY STENT INTERVENTION;  Surgeon: Wendel Lurena POUR, MD;  Location: [facility];  Service: Cardiovascular;  Laterality: N/A;   ESOPHAGOGASTRODUODENOSCOPY (EGD) WITH PROPOFOL  N/A 09/23/2021   Procedure: ESOPHAGOGASTRODUODENOSCOPY (EGD) WITH PROPOFOL ;  Surgeon: Eartha Angelia Sieving, MD;  Location: [facility];  Service: Gastroenterology;  Laterality: N/A;   IR IMAGING GUIDED PORT INSERTION  09/28/2021   IR PARACENTESIS  09/28/2021   JOINT REPLACEMENT Right    hip   LEFT HEART CATH AND CORONARY ANGIOGRAPHY N/A 07/04/2024   Procedure: LEFT HEART CATH AND CORONARY ANGIOGRAPHY;  Surgeon: Wendel Lurena POUR, MD;  Location: [facility];  Service: Cardiovascular;  Laterality: N/A;   LEFT HEART CATHETERIZATION WITH CORONARY ANGIOGRAM N/A 03/31/2013   Procedure: LEFT HEART CATHETERIZATION WITH CORONARY ANGIOGRAM;  Surgeon: Lynwood Schilling, MD;  Location: [facility];  Service: Cardiovascular;  Laterality: N/A;   POLYPECTOMY  09/23/2021   Procedure: POLYPECTOMY;  Surgeon:  Eartha Angelia Sieving, MD;  Location: [facility];  Service: Gastroenterology;;   TOTAL HIP ARTHROPLASTY Left 01/15/2024   Procedure: ARTHROPLASTY, HIP, TOTAL, ANTERIOR APPROACH;  Surgeon: Ernie Cough, MD;  Location: [facility];  Service: Orthopedics;  Laterality: Left;   TURBT  09/11/2009      Current Facility-Administered Medications:    0.9 %  sodium chloride  infusion, , Intravenous, PRN, Emokpae, Courage, MD   0.9 %  sodium chloride  infusion, , Intravenous, Continuous, Emokpae, Courage, MD, Last Rate: 83 mL/hr at 08/16/24 2153, New Bag at 08/16/24 2153   acetaminophen  (TYLENOL ) tablet 650 mg, 650 mg, Oral, Q6H PRN **OR** acetaminophen  (TYLENOL ) suppository 650 mg, 650 mg, Rectal, Q6H PRN, Emokpae, Courage, MD   aspirin  EC tablet 81 mg, 81 mg, Oral, Daily, Pearlean Manus, MD  benzonatate  (TESSALON ) capsule 200 mg, 200 mg, Oral, TID PRN, Emokpae, Courage, MD   bisacodyl  (DULCOLAX) suppository 10 mg, 10 mg, Rectal, Daily PRN, Emokpae, Courage, MD   buPROPion  (WELLBUTRIN  XL) 24 hr tablet 300 mg, 300 mg, Oral, Daily, Emokpae, Courage, MD   Chlorhexidine  Gluconate Cloth 2 % PADS 6 each, 6 each, Topical, Daily, Emokpae, Courage, MD   cyclobenzaprine  (FLEXERIL ) tablet 10 mg, 10 mg, Oral, Q6H PRN, Emokpae, Courage, MD   diphenoxylate -atropine  (LOMOTIL ) 2.5-0.025 MG per tablet 1 tablet, 1 tablet, Oral, QID PRN, Emokpae, Courage, MD   heparin  injection 5,000 Units, 5,000 Units, Subcutaneous, Q8H, Emokpae, Courage, MD, 5,000 Units at 08/17/24 9387   HYDROmorphone  (DILAUDID ) injection 1 mg, 1 mg, Intravenous, Q4H PRN, Emokpae, Courage, MD   insulin  aspart (novoLOG ) injection 0-4 Units, 0-4 Units, Subcutaneous, TID WC, Emokpae, Courage, MD   insulin  aspart (novoLOG ) injection 0-5 Units, 0-5 Units, Subcutaneous, QHS, Emokpae, Courage, MD   loratadine  (CLARITIN ) tablet 10 mg, 10 mg, Oral, Daily, Emokpae, Courage, MD   magnesium  oxide (MAG-OX) tablet 200 mg, 200 mg, Oral, Daily, Emokpae, Courage, MD    metoprolol  tartrate (LOPRESSOR ) tablet 12.5 mg, 12.5 mg, Oral, BID, Emokpae, Courage, MD, 12.5 mg at 08/16/24 2152   NIFEdipine  (PROCARDIA -XL/NIFEDICAL-XL) 24 hr tablet 30 mg, 30 mg, Oral, Daily, Emokpae, Courage, MD   nitroGLYCERIN  (NITROSTAT ) SL tablet 0.4 mg, 0.4 mg, Sublingual, Q5 Min x 3 PRN, Emokpae, Courage, MD   ondansetron  (ZOFRAN ) tablet 4 mg, 4 mg, Oral, Q6H PRN **OR** ondansetron  (ZOFRAN ) injection 4 mg, 4 mg, Intravenous, Q6H PRN, Emokpae, Courage, MD, 4 mg at 08/16/24 1913   oxyCODONE  (Oxy IR/ROXICODONE ) immediate release tablet 5 mg, 5 mg, Oral, Q4H PRN, Emokpae, Courage, MD   pantoprazole  (PROTONIX ) injection 40 mg, 40 mg, Intravenous, Q12H, Emokpae, Courage, MD   phenol (CHLORASEPTIC) mouth spray 1 spray, 1 spray, Mouth/Throat, PRN, Stechschulte, Deward PARAS, MD, 1 spray at 08/17/24 0023   piperacillin -tazobactam (ZOSYN ) IVPB 3.375 g, 3.375 g, Intravenous, Q8H, Lenon Elsie HERO, RPH, Last Rate: 12.5 mL/hr at 08/17/24 0023, 3.375 g at 08/17/24 0023   polyethylene glycol (MIRALAX  / GLYCOLAX ) packet 17 g, 17 g, Oral, Daily PRN, Emokpae, Courage, MD   sodium chloride  flush (NS) 0.9 % injection 3 mL, 3 mL, Intravenous, Q12H, Emokpae, Courage, MD, 3 mL at 08/16/24 2157   sodium chloride  flush (NS) 0.9 % injection 3 mL, 3 mL, Intravenous, Q12H, Emokpae, Courage, MD, 3 mL at 08/16/24 2157   sodium chloride  flush (NS) 0.9 % injection 3 mL, 3 mL, Intravenous, PRN, Emokpae, Courage, MD, 3 mL at 08/16/24 2157   tamsulosin  (FLOMAX ) capsule 0.4 mg, 0.4 mg, Oral, Daily, Emokpae, Courage, MD   ticagrelor  (BRILINTA ) tablet 90 mg, 90 mg, Oral, BID, Emokpae, Courage, MD, 90 mg at 08/16/24 2250   traZODone  (DESYREL ) tablet 50 mg, 50 mg, Oral, QHS PRN, Emokpae, Courage, MD  Facility-Administered Medications Ordered in Other Encounters:    magnesium  sulfate 2 GM/50ML IVPB, , , ,   aspirin  EC  81 mg Oral Daily   buPROPion   300 mg Oral Daily   Chlorhexidine  Gluconate Cloth  6 each Topical Daily    heparin   5,000 Units Subcutaneous Q8H   insulin  aspart  0-4 Units Subcutaneous TID WC   insulin  aspart  0-5 Units Subcutaneous QHS   loratadine   10 mg Oral Daily   magnesium  oxide  200 mg Oral Daily   metoprolol  tartrate  12.5 mg Oral BID   NIFEdipine   30 mg Oral Daily   pantoprazole  (  PROTONIX ) IV  40 mg Intravenous Q12H   sodium chloride  flush  3 mL Intravenous Q12H   sodium chloride  flush  3 mL Intravenous Q12H   tamsulosin   0.4 mg Oral Daily   ticagrelor   90 mg Oral BID    sodium chloride      sodium chloride  83 mL/hr at 08/16/24 2153   piperacillin -tazobactam (ZOSYN )  IV 3.375 g (08/17/24 0023)    Physical Exam: Blood pressure 93/66, pulse (!) 117, temperature 98.3 F (36.8 C), temperature source Oral, resp. rate 15, height 6' (1.829 m), weight 115.7 kg, SpO2 96%.    Overweight male Surprisingly in now distress NG tube with heme positive drainage Mild abdominal distension with no rebound No edema Normal heart sounds Clear lungs   Labs:   Lab Results  Component Value Date   WBC 2.2 (L) 08/17/2024   HGB 10.8 (L) 08/17/2024   HCT 32.5 (L) 08/17/2024   MCV 91.5 08/17/2024   PLT 170 08/17/2024    Recent Labs  Lab 08/17/24 0208  NA 138  K 3.3*  CL 99  CO2 28  BUN 23  CREATININE 1.67*  CALCIUM  8.3*  PROT 5.3*  BILITOT 1.2  ALKPHOS 122  ALT 87*  AST 107*  GLUCOSE 125*   Lab Results  Component Value Date   TROPONINI <0.30 06/11/2014    Lab Results  Component Value Date   CHOL 79 07/04/2024   CHOL 121 07/22/2014   CHOL 86 05/22/2013   Lab Results  Component Value Date   HDL 34 (L) 07/04/2024   HDL 35 (L) 07/22/2014   HDL 28 (L) 05/22/2013   Lab Results  Component Value Date   LDLCALC 22 07/04/2024   LDLCALC 52 07/22/2014   LDLCALC 30 05/22/2013   Lab Results  Component Value Date   TRIG 115 07/04/2024   TRIG 169 (H) 07/22/2014   TRIG 141 05/22/2013   Lab Results  Component Value Date   CHOLHDL 2.3 07/04/2024   CHOLHDL 3.5 07/22/2014    CHOLHDL 3.1 05/22/2013   No results found for: LDLDIRECT    Radiology: DG Abd 1 View Result Date: 08/17/2024 EXAM: 1 VIEW XRAY OF THE ABDOMEN 08/17/2024 12:28:00 AM COMPARISON: None available. CLINICAL HISTORY: Encounter for nasogastric (NG) tube placement. FINDINGS: LINES, TUBES AND DEVICES: Enteric tube courses below the hemidiaphragm with the tip overlying the gastric lumen and side port not well visualized, likely in the region of the gastroesophageal junction. BOWEL: Nonobstructive bowel gas pattern. SOFT TISSUES: No abnormal calcifications. BONES: No acute fracture. LIMITATIONS: Right flank and pelvis collimated off view. IMPRESSION: 1. Enteric tube tip overlies the gastric lumen; side port is not well visualized and is likely at the gastroesophageal junction, consider advancing the tube slightly to ensure the side port is intragastric. Electronically signed by: Morgane Naveau MD 08/17/2024 12:33 AM EST RP Workstation: [id number]   CT ANGIO GI BLEED Result Date: 08/16/2024 EXAM: CTA ABDOMEN AND PELVIS WITH CONTRAST 08/16/2024 03:23:46 PM TECHNIQUE: CTA images of the abdomen and pelvis with intravenous contrast. 100 mL iohexol  (OMNIPAQUE ) 350 MG/ML injection. Three-dimensional MIP/volume rendered formations were performed. Automated exposure control, iterative reconstruction, and/or weight based adjustment of the mA/kV was utilized to reduce the radiation dose to as low as reasonably achievable. COMPARISON: 05/15/2024 and 06/03/2024. CLINICAL HISTORY: Suspected GI bleed. FINDINGS: VASCULATURE: GI BLEED: No active extravasation of contrast within the GI tract. AORTA: No acute finding. No abdominal aortic aneurysm. No dissection. CELIAC TRUNK: No acute finding. No occlusion or significant stenosis. SUPERIOR  MESENTERIC ARTERY: No acute finding. No occlusion or significant stenosis. INFERIOR MESENTERIC ARTERY: No acute finding. No occlusion or significant stenosis. RENAL ARTERIES: No acute finding. No  occlusion or significant stenosis. ILIAC ARTERIES: No acute finding. No occlusion or significant stenosis. ABDOMEN/PELVIS: LOWER CHEST: Visualized portion of the lower chest demonstrates no acute abnormality. LIVER: Hepatic low density is again noted consistent with metastatic disease. GALLBLADDER AND BILE DUCTS: Cholelithiasis. No biliary ductal dilatation. SPLEEN: The spleen is unremarkable. PANCREAS: The pancreas is unremarkable. ADRENAL GLANDS: Bilateral adrenal glands demonstrate no acute abnormality. KIDNEYS, URETERS AND BLADDER: No stones in the kidneys or ureters. No hydronephrosis. No perinephric or periureteral stranding. Urinary bladder is unremarkable. GI AND BOWEL: Severely dilated small bowel loops are noted with intramural gas in multiple small bowel loops consistent with ischemic bowel disease. No colonic dilatation is noted. Stomach and duodenal sweep demonstrate no acute abnormality. REPRODUCTIVE: Reproductive organs are unremarkable. PERITONEUM AND RETROPERITONEUM: Mild ascites is noted. 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 No free air. LYMPH NODES: No lymphadenopathy. BONES AND SOFT TISSUES: Status post bilateral hip arthroplasties. No acute abnormality of the bones. No acute soft tissue abnormality. IMPRESSION: 1. Severely dilated small bowel loops with intramural gas, consistent with ischemic bowel disease. This finding was discussed with Dr. Melvenia at 3:38 pm on 08/16/2024. 2. No active gastrointestinal bleeding identified. 3. Hepatic low-density lesions consistent with metastatic disease. 4. 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 Electronically signed by: Lynwood Seip MD 08/16/2024 03:40 PM EST RP Workstation: [id number]   US  ASCITES (ABDOMEN LIMITED) Result Date: 08/15/2024 CLINICAL DATA:  Patient with history of cholangiocarcinoma with malignant ascites, recently started on diuretic medication. Request for possible  therapeutic paracentesis. EXAM: LIMITED ABDOMEN ULTRASOUND FOR ASCITES TECHNIQUE: Limited ultrasound survey for ascites was performed in all four abdominal quadrants. COMPARISON:  None Available. FINDINGS: Limited abdominal ultrasound of all four abdominal quadrants shows very small midline right upper quadrant pocket of ascites. No other areas of peritoneal fluid seen. IMPRESSION: Very small midline right upper quadrant pocket of ascites, not amenable to safe paracentesis. No procedure performed. Electronically Signed   By: Wilkie Lent M.D.   On: 08/15/2024 14:08    EKG: ST rate 117 no acute ST changes    ASSESSMENT AND PLAN:   CAD: with stenting of LCX  3.5 mm post dilated to 3/75 so large stent. Normal EF No angina. Had been on Brillinta and ASA/ Currently just written for 81 mg ASA but has NG tube in. Would consider ASA suppository instead. He has ischemic bowel. Doubt general surgery will operate on him with chronic metastatic cholangiocarcinoma Would hold brillinta while NG aspirate is heme positive. Follow Hct and abdominal exam. Prognosis is extremely poor. If he recovers and Hct stable with no bloody stool or NG aspirate can consider adding back Brillinta or using short term Cangrelor. He is not having any angina and ECG is normal.   Signed: Maude Emmer 08/17/2024, 8:40 AM

## 2024-08-18 ENCOUNTER — Inpatient Hospital Stay (HOSPITAL_COMMUNITY)

## 2024-08-18 ENCOUNTER — Inpatient Hospital Stay

## 2024-08-18 ENCOUNTER — Encounter: Payer: Self-pay | Admitting: Oncology

## 2024-08-18 DIAGNOSIS — K559 Vascular disorder of intestine, unspecified: Secondary | ICD-10-CM | POA: Diagnosis not present

## 2024-08-18 LAB — BASIC METABOLIC PANEL WITH GFR
Anion gap: 9 (ref 5–15)
BUN: 24 mg/dL — ABNORMAL HIGH (ref 8–23)
CO2: 29 mmol/L (ref 22–32)
Calcium: 8 mg/dL — ABNORMAL LOW (ref 8.9–10.3)
Chloride: 100 mmol/L (ref 98–111)
Creatinine, Ser: 1.48 mg/dL — ABNORMAL HIGH (ref 0.61–1.24)
GFR, Estimated: 51 mL/min — ABNORMAL LOW (ref >60)
Glucose, Bld: 80 mg/dL (ref 70–99)
Potassium: 3.1 mmol/L — ABNORMAL LOW (ref 3.5–5.1)
Sodium: 138 mmol/L (ref 135–145)

## 2024-08-18 LAB — GLUCOSE, CAPILLARY
Glucose-Capillary: 111 mg/dL — ABNORMAL HIGH (ref 70–99)
Glucose-Capillary: 63 mg/dL — ABNORMAL LOW (ref 70–99)
Glucose-Capillary: 78 mg/dL (ref 70–99)
Glucose-Capillary: 79 mg/dL (ref 70–99)
Glucose-Capillary: 82 mg/dL (ref 70–99)
Glucose-Capillary: 83 mg/dL (ref 70–99)
Glucose-Capillary: 88 mg/dL (ref 70–99)

## 2024-08-18 LAB — CBC
HCT: 28.2 % — ABNORMAL LOW (ref 39.0–52.0)
Hemoglobin: 9 g/dL — ABNORMAL LOW (ref 13.0–17.0)
MCH: 30.8 pg (ref 26.0–34.0)
MCHC: 31.9 g/dL (ref 30.0–36.0)
MCV: 96.6 fL (ref 80.0–100.0)
Platelets: 169 K/uL (ref 150–400)
RBC: 2.92 MIL/uL — ABNORMAL LOW (ref 4.22–5.81)
RDW: 19.1 % — ABNORMAL HIGH (ref 11.5–15.5)
WBC: 5.3 K/uL (ref 4.0–10.5)
nRBC: 0.6 % — ABNORMAL HIGH (ref 0.0–0.2)

## 2024-08-18 LAB — MAGNESIUM: Magnesium: 1.8 mg/dL (ref 1.7–2.4)

## 2024-08-18 MED ORDER — DEXTROSE 50 % IV SOLN
12.5000 g | INTRAVENOUS | Status: AC
  Administered 2024-08-18: 12.5 g via INTRAVENOUS
  Filled 2024-08-18: qty 50

## 2024-08-18 MED ORDER — POTASSIUM CHLORIDE 10 MEQ/100ML IV SOLN
10.0000 meq | INTRAVENOUS | Status: AC
  Administered 2024-08-18 (×6): 10 meq via INTRAVENOUS
  Filled 2024-08-18 (×6): qty 100

## 2024-08-18 MED ORDER — SODIUM CHLORIDE 0.9 % IV SOLN: 12.5000 mg | Freq: Three times a day (TID) | INTRAVENOUS | Status: DC | PRN

## 2024-08-18 MED ORDER — SODIUM CHLORIDE 0.9% FLUSH: 10.0000 mL | INTRAVENOUS | Status: DC | PRN

## 2024-08-18 MED ORDER — ZOLPIDEM TARTRATE 5 MG PO TABS
5.0000 mg | ORAL_TABLET | Freq: Every evening | ORAL | Status: DC | PRN
  Administered 2024-08-18 – 2024-08-22 (×5): 5 mg via ORAL
  Filled 2024-08-18 (×5): qty 1

## 2024-08-18 MED ORDER — MAGNESIUM SULFATE 2 GM/50ML IV SOLN
2.0000 g | Freq: Once | INTRAVENOUS | Status: AC
  Administered 2024-08-18: 2 g via INTRAVENOUS
  Filled 2024-08-18: qty 50

## 2024-08-18 NOTE — Progress Notes (Addendum)
 Marvin Carr   DOB:1956-01-19   FM#:[id number]      ASSESSMENT & PLAN:  Marvin Carr is a 68 year old male patient admitted 08/16/24 with complaints of increasing abdominal pain with coffee-ground emesis.  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  Medical Oncology following.   Abdominal pain/ischemic bowel disease Coffee-ground emesis -Patient admitted 08/16/2024 with complaints of progressive abdominal pain with cramping and 1 episode of coffee-ground emesis. - Imaging 08/16/2024 consistent with ischemic bowel disease and peritoneal carcinomatosis - Likely secondary to malignancy -NG tube intact.   -- Patient remains n.p.o. on bowel rest.  Reports that he feels much better today and has no abdominal pain at this time. - Continue pain management as ordered  Intrahepatic cholangiocarcinoma with metastases to bile duct, liver, and peritoneal carcinomatosis - Diagnosed in 2022 - Initially treated with cisplatin , gemcitabine , and durvalumab .  Due to progression of disease, regimen was switched to FOLFOX. - Status post paracentesis -Palliative following - Followed with Dr. Davonna in [place], however patient would like to follow-up with Dr. [name] who is following closely.  Anemia - Hemoglobin 9.0 today - Transfuse PRBC for Hgb <7.0.  No transfusional intervention required at this time - Continue to monitor CBC with differential  Renal dysfunction - Slightly elevated creatinine and BUN - Avoid nephrotoxic agents - Continue to monitor renal function  History of chemo induced chest pain/vasospasm during chemotherapy FOLFOX regimen Coronary artery disease - Patient recounts chest pain during first 2 cycles of chemotherapy.  During first FOLFOX treatment, it was found patient had 80% coronary artery blockage leading to stent placement.  However chest pain again occurred during cycle 2 FOLFOX.  He was  treated with nitro and oxycodone . - Cycle 3 FOLFOX given 07/29/2024. - Cardiology following  Hypertension DM - Monitor blood pressure closely.  Continue antihypertensives as ordered - Monitor blood sugar levels.  Continue antidiabetic meds.    Code Status DNR-Limited  Subjective:  Patient seen awake alert and oriented x3 sitting in chair at bedside.  NGT intact. Remains NPO.  Denies abdominal pain, that he feels much better than when he came in. Pleasant and talkative, recounts oncologic history well.  No acute distress noted.   Objective:   Intake/Output Summary (Last 24 hours) at 08/18/2024 1349 Last data filed at 08/18/2024 1000 Gross per 24 hour  Intake 100 ml  Output 2350 ml  Net -2250 ml     PHYSICAL EXAMINATION: ECOG PERFORMANCE STATUS: 3 - Symptomatic, >50% confined to bed  Vitals:   08/18/24 0609 08/18/24 1004  BP: 103/66 105/65  Pulse: 91 89  Resp: 16 17  Temp: 98.4 F (36.9 C) (!) 97.3 F (36.3 C)  SpO2: 97% 97%   Filed Weights   08/16/24 1258  Weight: 255 lb (115.7 kg)    GENERAL: alert, no distress and comfortable SKIN: skin color, texture, turgor are normal, no rashes or significant lesions EYES: normal, conjunctiva are pink and non-injected, sclera clear OROPHARYNX: + NGT intact  NECK: supple, thyroid  normal size, non-tender, without nodularity LYMPH: no palpable lymphadenopathy in the cervical, axillary or inguinal LUNGS: clear to auscultation and percussion with normal breathing effort HEART: regular rate & rhythm and no murmurs and no lower extremity edema ABDOMEN: + Abdominal distention, nontender MUSCULOSKELETAL: no cyanosis of digits and no clubbing  PSYCH: alert & oriented x 3 with fluent speech NEURO: no focal motor/sensory deficits   All questions were answered. The patient knows to call the clinic  with any problems, questions or concerns.   The total time spent in the appointment was 40 minutes encounter with patient including  review of chart and various tests results, discussions about plan of care and coordination of care plan  Olam JINNY Brunner, NP 08/18/2024 1:49 PM    Labs Reviewed:  Lab Results  Component Value Date   WBC 5.3 08/18/2024   HGB 9.0 (L) 08/18/2024   HCT 28.2 (L) 08/18/2024   MCV 96.6 08/18/2024   PLT 169 08/18/2024   Recent Labs    07/17/24 0156 07/17/24 0637 08/12/24 1223 08/16/24 1325 08/17/24 0208 08/18/24 0435  NA  --    < > 137 136 138 138  K  --    < > 3.4* 3.6 3.3* 3.1*  CL  --    < > 99 98 99 100  CO2  --    < > 24 22 28 29   GLUCOSE  --    < > 140* 147* 125* 80  BUN  --    < > 19 20 23  24*  CREATININE  --    < > 1.17 1.17 1.67* 1.48*  CALCIUM   --    < > 8.6* 9.1 8.3* 8.0*  GFRNONAA  --    < > >60 >60 44* 51*  PROT 6.2*   < > 5.9* 6.2* 5.3*  --   ALBUMIN  2.9*   < > 3.4* 3.3* 2.2*  --   AST 21   < > 25 100* 107*  --   ALT 13   < > 17 75* 87*  --   ALKPHOS 67   < > 90 141* 122  --   BILITOT 0.8   < > 0.3 0.9 1.2  --   BILIDIR 0.2  --   --   --   --   --   IBILI 0.6  --   --   --   --   --    < > = values in this interval not displayed.    Studies Reviewed:   DG Abd 1 View Result Date: 08/17/2024 EXAM: 1 VIEW XRAY OF THE ABDOMEN 08/17/2024 12:28:00 AM COMPARISON: None available. CLINICAL HISTORY: Encounter for nasogastric (NG) tube placement. FINDINGS: LINES, TUBES AND DEVICES: Enteric tube courses below the hemidiaphragm with the tip overlying the gastric lumen and side port not well visualized, likely in the region of the gastroesophageal junction. BOWEL: Nonobstructive bowel gas pattern. SOFT TISSUES: No abnormal calcifications. BONES: No acute fracture. LIMITATIONS: Right flank and pelvis collimated off view. IMPRESSION: 1. Enteric tube tip overlies the gastric lumen; side port is not well visualized and is likely at the gastroesophageal junction, consider advancing the tube slightly to ensure the side port is intragastric. Electronically signed by: Morgane Naveau MD  08/17/2024 12:33 AM EST RP Workstation: [id number]   CT ANGIO GI BLEED Result Date: 08/16/2024 EXAM: CTA ABDOMEN AND PELVIS WITH CONTRAST 08/16/2024 03:23:46 PM TECHNIQUE: CTA images of the abdomen and pelvis with intravenous contrast. 100 mL iohexol  (OMNIPAQUE ) 350 MG/ML injection. Three-dimensional MIP/volume rendered formations were performed. Automated exposure control, iterative reconstruction, and/or weight based adjustment of the mA/kV was utilized to reduce the radiation dose to as low as reasonably achievable. COMPARISON: 05/15/2024 and 06/03/2024. CLINICAL HISTORY: Suspected GI bleed. FINDINGS: VASCULATURE: GI BLEED: No active extravasation of contrast within the GI tract. AORTA: No acute finding. No abdominal aortic aneurysm. No dissection. CELIAC TRUNK: No acute finding. No occlusion or significant stenosis. SUPERIOR MESENTERIC ARTERY: No acute  finding. No occlusion or significant stenosis. INFERIOR MESENTERIC ARTERY: No acute finding. No occlusion or significant stenosis. RENAL ARTERIES: No acute finding. No occlusion or significant stenosis. ILIAC ARTERIES: No acute finding. No occlusion or significant stenosis. ABDOMEN/PELVIS: LOWER CHEST: Visualized portion of the lower chest demonstrates no acute abnormality. LIVER: Hepatic low density is again noted consistent with metastatic disease. GALLBLADDER AND BILE DUCTS: Cholelithiasis. No biliary ductal dilatation. SPLEEN: The spleen is unremarkable. PANCREAS: The pancreas is unremarkable. ADRENAL GLANDS: Bilateral adrenal glands demonstrate no acute abnormality. KIDNEYS, URETERS AND BLADDER: No stones in the kidneys or ureters. No hydronephrosis. No perinephric or periureteral stranding. Urinary bladder is unremarkable. GI AND BOWEL: Severely dilated small bowel loops are noted with intramural gas in multiple small bowel loops consistent with ischemic bowel disease. No colonic dilatation is noted. Stomach and duodenal sweep demonstrate no acute  abnormality. REPRODUCTIVE: Reproductive organs are unremarkable. PERITONEUM AND RETROPERITONEUM: Mild ascites is noted. 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 No free air. LYMPH NODES: No lymphadenopathy. BONES AND SOFT TISSUES: Status post bilateral hip arthroplasties. No acute abnormality of the bones. No acute soft tissue abnormality. IMPRESSION: 1. Severely dilated small bowel loops with intramural gas, consistent with ischemic bowel disease. This finding was discussed with Dr. Melvenia at 3:38 pm on 08/16/2024. 2. No active gastrointestinal bleeding identified. 3. Hepatic low-density lesions consistent with metastatic disease. 4. 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 Electronically signed by: Lynwood Seip MD 08/16/2024 03:40 PM EST RP Workstation: [id number]   US  ASCITES (ABDOMEN LIMITED) Result Date: 08/15/2024 CLINICAL DATA:  Patient with history of cholangiocarcinoma with malignant ascites, recently started on diuretic medication. Request for possible therapeutic paracentesis. EXAM: LIMITED ABDOMEN ULTRASOUND FOR ASCITES TECHNIQUE: Limited ultrasound survey for ascites was performed in all four abdominal quadrants. COMPARISON:  None Available. FINDINGS: Limited abdominal ultrasound of all four abdominal quadrants shows very small midline right upper quadrant pocket of ascites. No other areas of peritoneal fluid seen. IMPRESSION: Very small midline right upper quadrant pocket of ascites, not amenable to safe paracentesis. No procedure performed. Electronically Signed   By: Wilkie Lent M.D.   On: 08/15/2024 14:08    Addendum I have seen the patient, examined him. I agree with the assessment and and plan and have edited the notes.   Patient was admitted for acute abdominal pain, and workup showed ischemia bowel.  Has NG tube in place, and his abdominal pain has resolved.  He feels hungry, but he understands he is not able to eat for  now and diet will gradually advance after NG tube is not needed.  He is due for chemotherapy tomorrow, which we have canceled.  I have reviewed his lab and CT scan images, I hope he will recover well the point he is able to restart chemotherapy. This will be evaluate after his hospital discharge. I will f/u as needed while he is here.   Onita Mattock MD 08/18/2024

## 2024-08-18 NOTE — Progress Notes (Signed)
 Palliative Medicine Inpatient Follow Up Note HPI: Marvin Carr is a 68 y.o. 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 Palliative care has been requested to support additional goals of care conversations.   Today's Discussion 08/18/2024  *Please note that this is a verbal dictation therefore any spelling or grammatical errors are due to the [employer] One system interpretation.  I reviewed the chart notes including nursing notes from Ramita, progress notes from Dr. Barbaraann, Marjorie Favre. I also reviewed vital signs, nursing flowsheets, medication administrations record, labs Hgb 9.0/Hct 28.2, and imaging.    Oral Intake %:  NPO I/O:  (-) Bowel Movements:  None Mobility: Can mobilize  I met with Marvin Carr this morning. He is awake and alert. He shares that he is having some hiccups and typically when these occur he has received thorazine  which has been helpful. He also had a hard time sleeping overnight and typically received Ambien . Patient RN, Marvin Carr has messaged the primary team to resume these medications.   Created space and opportunity for patient to explore thoughts feelings and fears regarding current medical situation. He and I reviewed that we are still awaiting input from Dr. [name] which is the next step to better determine a path moving forward. He is hopeful she will see him today.   As of presently Marvin Carr is still producing a great amount of bile therefore he will remain NPO and have his NGT to suction per what he understands from the surgical team. Patient shares fatigue in the setting of declining Hgb/Hct we reviewed  why he is likely feeling how he is.   At this point allowing time for outcomes.   Questions and concerns addressed/Palliative Support Provided.   Objective Assessment: Vital Signs Vitals:   08/18/24 0609 08/18/24 1004  BP: 103/66 105/65  Pulse: 91 89  Resp: 16 17  Temp: 98.4 F (36.9 C) (!) 97.3 F (36.3 C)  SpO2: 97% 97%    Intake/Output Summary (Last 24 hours) at 08/18/2024 1248 Last data filed at 08/18/2024 1000 Gross per 24 hour  Intake 100 ml  Output 2700 ml  Net -2600 ml   Last Weight  Most recent update: 08/16/2024 12:58 PM    Weight  115.7 kg (255 lb)            Gen: Elderly Caucasian male HEENT: moist mucous membranes CV: Regular rate and rhythm  PULM: On 2L nasal cannula ABD: Soft  EXT: No edema  Neuro: Alert and oriented x3   SUMMARY OF RECOMMENDATIONS   DNAR/DNI   Allowing time for outcomes   Appreciate the insights of Dr. [name] on Marvin Carr's clinical course to help determine the best path moving forward from an Oncological perspective   I was able to reach out to the patient experience department to support additional conversations between patient and the [facility] system   Ongoing palliative care support during hospitalization ______________________________________________________________________________________ Rosaline Becton  Palliative Medicine Team Team Cell Phone: [phone] Please utilize secure chat with additional questions, if there is no response within 30 minutes please call the above phone number  Billing based on MDM: Moderate   Palliative Medicine Team providers are available by  phone from 7am to 7pm daily and can be reached through the team cell phone.  Should this patient require assistance outside of these hours, please call the patient's attending physician.

## 2024-08-18 NOTE — Progress Notes (Signed)
 Progress Note     Subjective: Patient reports abdominal pain has improved. Having flatulence. Denies BM, nausea, vomiting. Currently NPO with NGT.  ROS  All negative with the exception of above.   Objective: Vital signs in last 24 hours: Temp:  [97.6 F (36.4 C)-98.4 F (36.9 C)] 98.4 F (36.9 C) (12/08 0609) Pulse Rate:  [91-106] 91 (12/08 0609) Resp:  [15-18] 16 (12/08 0609) BP: (103-110)/(63-81) 103/66 (12/08 0609) SpO2:  [94 %-97 %] 97 % (12/08 0609) Last BM Date : 08/16/24  Intake/Output from previous day: 12/07 0701 - 12/08 0700 In: 120 [P.O.:120] Out: 1150 [Urine:150; Emesis/NG output:1000] Intake/Output this shift: No intake/output data recorded.  PE: General: Pleasant male who is laying in bed in NAD. HEENT: Head is normocephalic, atraumatic. Sclera are noninjected.  Heart: HR normal during encounter.  Lungs: Respiratory effort nonlabored. Abd: Soft. Minimal generalized tenderness to palpation. No rebound tenderness or guarding.  MS: Able to move all 4 extremities.  Skin: Warm and dry.  Psych: A&Ox3 with an appropriate affect.    Lab Results:  Recent Labs    08/17/24 0208 08/18/24 0435  WBC 2.2* 5.3  HGB 10.8* 9.0*  HCT 32.5* 28.2*  PLT 170 169   BMET Recent Labs    08/17/24 0208 08/18/24 0435  NA 138 138  K 3.3* 3.1*  CL 99 100  CO2 28 29  GLUCOSE 125* 80  BUN 23 24*  CREATININE 1.67* 1.48*  CALCIUM  8.3* 8.0*   PT/INR Recent Labs    08/16/24 1325  LABPROT 15.1  INR 1.1   CMP     Component Value Date/Time   NA 138 08/18/2024 0435   K 3.1 (L) 08/18/2024 0435   CL 100 08/18/2024 0435   CO2 29 08/18/2024 0435   GLUCOSE 80 08/18/2024 0435   BUN 24 (H) 08/18/2024 0435   CREATININE 1.48 (H) 08/18/2024 0435   CALCIUM  8.0 (L) 08/18/2024 0435   PROT 5.3 (L) 08/17/2024 0208   ALBUMIN  2.2 (L) 08/17/2024 0208   AST 107 (H) 08/17/2024 0208   ALT 87 (H) 08/17/2024 0208   ALKPHOS 122 08/17/2024 0208   BILITOT 1.2 08/17/2024 0208    GFRNONAA 51 (L) 08/18/2024 0435   GFRAA >90 06/11/2014 0558   Lipase     Component Value Date/Time   LIPASE 20 08/16/2024 1325       Studies/Results: DG Abd 1 View Result Date: 08/17/2024 EXAM: 1 VIEW XRAY OF THE ABDOMEN 08/17/2024 12:28:00 AM COMPARISON: None available. CLINICAL HISTORY: Encounter for nasogastric (NG) tube placement. FINDINGS: LINES, TUBES AND DEVICES: Enteric tube courses below the hemidiaphragm with the tip overlying the gastric lumen and side port not well visualized, likely in the region of the gastroesophageal junction. BOWEL: Nonobstructive bowel gas pattern. SOFT TISSUES: No abnormal calcifications. BONES: No acute fracture. LIMITATIONS: Right flank and pelvis collimated off view. IMPRESSION: 1. Enteric tube tip overlies the gastric lumen; side port is not well visualized and is likely at the gastroesophageal junction, consider advancing the tube slightly to ensure the side port is intragastric. Electronically signed by: Morgane Naveau MD 08/17/2024 12:33 AM EST RP Workstation: [id number]   CT ANGIO GI BLEED Result Date: 08/16/2024 EXAM: CTA ABDOMEN AND PELVIS WITH CONTRAST 08/16/2024 03:23:46 PM TECHNIQUE: CTA images of the abdomen and pelvis with intravenous contrast. 100 mL iohexol  (OMNIPAQUE ) 350 MG/ML injection. Three-dimensional MIP/volume rendered formations were performed. Automated exposure control, iterative reconstruction, and/or weight based adjustment of the mA/kV was utilized to reduce the radiation dose to  as low as reasonably achievable. COMPARISON: 05/15/2024 and 06/03/2024. CLINICAL HISTORY: Suspected GI bleed. FINDINGS: VASCULATURE: GI BLEED: No active extravasation of contrast within the GI tract. AORTA: No acute finding. No abdominal aortic aneurysm. No dissection. CELIAC TRUNK: No acute finding. No occlusion or significant stenosis. SUPERIOR MESENTERIC ARTERY: No acute finding. No occlusion or significant stenosis. INFERIOR MESENTERIC ARTERY: No acute  finding. No occlusion or significant stenosis. RENAL ARTERIES: No acute finding. No occlusion or significant stenosis. ILIAC ARTERIES: No acute finding. No occlusion or significant stenosis. ABDOMEN/PELVIS: LOWER CHEST: Visualized portion of the lower chest demonstrates no acute abnormality. LIVER: Hepatic low density is again noted consistent with metastatic disease. GALLBLADDER AND BILE DUCTS: Cholelithiasis. No biliary ductal dilatation. SPLEEN: The spleen is unremarkable. PANCREAS: The pancreas is unremarkable. ADRENAL GLANDS: Bilateral adrenal glands demonstrate no acute abnormality. KIDNEYS, URETERS AND BLADDER: No stones in the kidneys or ureters. No hydronephrosis. No perinephric or periureteral stranding. Urinary bladder is unremarkable. GI AND BOWEL: Severely dilated small bowel loops are noted with intramural gas in multiple small bowel loops consistent with ischemic bowel disease. No colonic dilatation is noted. Stomach and duodenal sweep demonstrate no acute abnormality. REPRODUCTIVE: Reproductive organs are unremarkable. PERITONEUM AND RETROPERITONEUM: Mild ascites is noted. 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 No free air. LYMPH NODES: No lymphadenopathy. BONES AND SOFT TISSUES: Status post bilateral hip arthroplasties. No acute abnormality of the bones. No acute soft tissue abnormality. IMPRESSION: 1. Severely dilated small bowel loops with intramural gas, consistent with ischemic bowel disease. This finding was discussed with Dr. Melvenia at 3:38 pm on 08/16/2024. 2. No active gastrointestinal bleeding identified. 3. Hepatic low-density lesions consistent with metastatic disease. 4. 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 Electronically signed by: Lynwood Seip MD 08/16/2024 03:40 PM EST RP Workstation: [id number]    Anti-infectives: Anti-infectives (From admission, onward)    Start     Dose/Rate Route Frequency Ordered Stop    08/17/24 0000  piperacillin -tazobactam (ZOSYN ) IVPB 3.375 g        3.375 g 12.5 mL/hr over 240 Minutes Intravenous Every 8 hours 08/16/24 1844     08/16/24 1545  piperacillin -tazobactam (ZOSYN ) IVPB 3.375 g        3.375 g 100 mL/hr over 30 Minutes Intravenous  Once 08/16/24 1543 08/16/24 1800        Assessment/Plan 68 y.o. male with metastatic cholangiocarcinoma with peritoneal carcinomatosis who presented with abdominal pain, found to have bowel obstruction with pneumatosis on CT  -Xray reviewed from 12/7. Will plan xray 12/9. -Afebrile. -WBC 5.3; HGB 9.0 from 10.8 -Increased Cr; IVF per primary team. -Pain minimal and improved. Having flatulence. Denies n/v/bm.  -NGT output noted to be from 12/7-12/8. -Keep NPO and maintain NGT -Ongoing communication with palliative care and Dr. [name].  -Will continue to follow.   FEN: NPO/NGT; IVF per primary team VTE:Heparin  injection ID:Zosyn     LOS: 2 days   I reviewed hospitalist notes, specialist notes, consulting provider notes, nursing notes, last 24 h vitals and pain scores, last 48 h intake and output, last 24 h labs and trends, and last 24 h imaging results.   This care required moderate level of medical decision making.    Marjorie Carlyon Favre, [facility] Surgery 08/18/2024, 8:14 AM Please see Amion for pager number during day hours 7:00am-4:30pm

## 2024-08-18 NOTE — Progress Notes (Signed)
 PROGRESS NOTE  Marvin Carr FMW:[id number] DOB: 1955-12-14 DOA: 08/16/2024 PCP: [name], MD   LOS: 2 days   Brief narrative:  Marvin Carr is a 68 y.o. 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  In the ED CT angiogram showed severely dilated small bowel loops with intramural gas, consistent with ischemic bowel disease.  Low-density lesions consistent with metastatic disease with irregular omental densities concerning for peritoneal carcinomatosis.  Urinalysis was unremarkable lipase was 20.  Hemoglobin of 11.0 with creatinine of 1.1.  ED provider spoke with on-call general surgery and patient was admitted hospital for further evaluation.    Assessment/Plan: Principal Problem:   Ischemic bowel syndrome Active Problems:   Essential hypertension   Peritoneal carcinomatosis (HCC)   Cholangiocarcinoma metastatic to liver (HCC)   IDA (iron deficiency anemia)   S/P primary angioplasty with coronary stent   Ischemic bowel disease  Concern for ischemic bowel/bowel obstruction - CT scan of abdomen consistent with ischemic bowel.   General surgery on board and recommend treatment for bowel obstruction.  Continue IV Zosyn , Protonix , n.p.o,. IV fluids antiemetics.  General surgery on board and recommend NG tube and bowel rest.  Patient states that he did have some flatus.   Metastatic cholangiocarcinoma  Diagnosed in 2022 with peritoneal carcinomatosis and metastatic disease to the liver and bile duct. Patient was initially initially treated with cisplatin , gemcitabine , and durvalumab  for 6 months despite that had disease  progression in 2025, treatment delayed by hip replacement in May 2025.  He was transitioned to FOLFOX 06/2024.  High risk for any surgical intervention.  Oncology to follow regarding further plans and prognosis.  Palliative care has been consulted for further goals of care discussion.   History of CAD s/p PCI to the Lcx (stent placed 07/04/24) patient was on Brilinta /aspirin  as outpatient with plan to continue dual antiplatelet therapy for at least 6 months but preferably 1 year with aspirin  and Brilinta  followed by Brilinta  monotherapy indefinitely.  Lipitor on hold.  Continue metoprolol .  Cardiology on board and at this time recommended aspirin  suppository while holding Brilinta  due to heme positive aspirate.   HFpEF  review of 2D echocardiogram from  07/03/2024 with EF of 60 to 65%, grade 1 diastolic dysfunction noted.  Diuretics on hold.   Diabetes mellitus type 2. Recent hemoglobin A1C is 5.7 reflecting excellent diabetic control PTA.  Continue to hold metformin  and Farxiga .  Continue sliding scale insulin  Accu-Cheks.  Currently NPO.  POC glucose of 79  Mild hypokalemia.  Potassium is still low at 3.1.  Will replenish if through IV.  Check levels in AM.   Essential hypertension-patient is on Procardia  and metoprolol  at home.   Mild anemia leukopenia will continue to monitor.  Check CBC in AM.   Goals of care.  Palliative care on board.  DNR.  DVT prophylaxis: heparin  injection 5,000 Units Start: 08/16/24 2200 SCDs Start: 08/16/24 1848 Place TED hose Start: 08/16/24 1848   Disposition: Uncertain at this time.  Likely home with 2 to 3 days  Status is: Inpatient Remains inpatient appropriate because: Pending clinical improvement,    Code Status:     Code Status: Limited: Do not attempt resuscitation (  DNR) -DNR-LIMITED -Do Not Intubate/DNI   Family Communication: Spoke with the patient's friend at bedside on 08/17/2024  Consultants: General Surgery Cardiology Palliative  care  Procedures: NG tube placement  Anti-infectives:  Zosyn  IV  Anti-infectives (From admission, onward)    Start     Dose/Rate Route Frequency Ordered Stop   08/17/24 0000  piperacillin -tazobactam (ZOSYN ) IVPB 3.375 g        3.375 g 12.5 mL/hr over 240 Minutes Intravenous Every 8 hours 08/16/24 1844     08/16/24 1545  piperacillin -tazobactam (ZOSYN ) IVPB 3.375 g        3.375 g 100 mL/hr over 30 Minutes Intravenous  Once 08/16/24 1543 08/16/24 1800        Subjective: Today, patient was seen and examined at bedside.  Patient states that he feels a little better and has had some flatus.  Denies any nausea vomiting or abdominal pain.  Has mild cough.  Objective: Vitals:   08/18/24 0609 08/18/24 1004  BP: 103/66 105/65  Pulse: 91 89  Resp: 16 17  Temp: 98.4 F (36.9 C) (!) 97.3 F (36.3 C)  SpO2: 97% 97%    Intake/Output Summary (Last 24 hours) at 08/18/2024 1325 Last data filed at 08/18/2024 1000 Gross per 24 hour  Intake 100 ml  Output 2350 ml  Net -2250 ml   Filed Weights   08/16/24 1258  Weight: 115.7 kg   Body mass index is 34.58 kg/m.   Physical Exam: GENERAL: Patient is alert awake and oriented. Not in obvious distress.  Obese built, NG tube in place HENT: No scleral pallor or icterus. Pupils equally reactive to light. Oral mucosa is moist NECK: is supple, no gross swelling noted. CHEST: Diminished breath sounds bilaterally CVS: S1 and S2 heard, no murmur. Regular rate and rhythm.  ABDOMEN: Soft, nonspecific tenderness, mild distention, bowel sounds present EXTREMITIES: No edema. CNS: Cranial nerves are intact.  Moves all extremities SKIN: warm and dry without rashes.  Data Review: I have personally reviewed the following laboratory data and studies,  CBC: Recent Labs  Lab 08/12/24 1223 08/16/24 1325 08/17/24 0208 08/18/24 0435  WBC 7.1 3.0* 2.2* 5.3  NEUTROABS 5.2 1.9  --   --   HGB 9.6* 11.0* 10.8* 9.0*  HCT 30.5* 34.0* 32.5* 28.2*  MCV  96.5 95.2 91.5 96.6  PLT 173 178 170 169   Basic Metabolic Panel: Recent Labs  Lab 08/12/24 1223 08/16/24 1325 08/17/24 0208 08/18/24 0435  NA 137 136 138 138  K 3.4* 3.6 3.3* 3.1*  CL 99 98 99 100  CO2 24 22 28 29   GLUCOSE 140* 147* 125* 80  BUN 19 20 23  24*  CREATININE 1.17 1.17 1.67* 1.48*  CALCIUM  8.6* 9.1 8.3* 8.0*  MG 1.4*  --   --  1.8   Liver Function Tests: Recent Labs  Lab 08/12/24 1223 08/16/24 1325 08/17/24 0208  AST 25 100* 107*  ALT 17 75* 87*  ALKPHOS 90 141* 122  BILITOT 0.3 0.9 1.2  PROT 5.9* 6.2* 5.3*  ALBUMIN  3.4* 3.3* 2.2*   Recent Labs  Lab 08/16/24 1325  LIPASE 20   No results for input(s): AMMONIA in the last 168 hours. Cardiac Enzymes: No results for input(s): CKTOTAL, CKMB, CKMBINDEX, TROPONINI in the last 168 hours. BNP (last 3 results) Recent Labs    05/15/24 1758  BNP 115.0*    ProBNP (last 3 results) Recent Labs    07/03/24 0023  PROBNP 218.0    CBG: Recent Labs  Lab 08/17/24 2126 08/18/24 0038 08/18/24 0615 08/18/24 0813 08/18/24 1205  GLUCAP 82 83 82 88 79   No results found for this or any previous visit (from the past 240 hours).   Studies: DG Abd 1 View Result Date: 08/17/2024 EXAM: 1 VIEW XRAY OF THE ABDOMEN 08/17/2024 12:28:00 AM COMPARISON: None available. CLINICAL HISTORY: Encounter for nasogastric (NG) tube placement. FINDINGS: LINES, TUBES AND DEVICES: Enteric tube courses below the hemidiaphragm with the tip overlying the gastric lumen and side port not well visualized, likely in the region of the gastroesophageal junction. BOWEL: Nonobstructive bowel gas pattern. SOFT TISSUES: No abnormal calcifications. BONES: No acute fracture. LIMITATIONS: Right flank and pelvis collimated off view. IMPRESSION: 1. Enteric tube tip overlies the gastric lumen; side port is not well visualized and is likely at the gastroesophageal junction, consider advancing the tube slightly to ensure the side port is  intragastric. Electronically signed by: Morgane Naveau MD 08/17/2024 12:33 AM EST RP Workstation: [id number]   CT ANGIO GI BLEED Result Date: 08/16/2024 EXAM: CTA ABDOMEN AND PELVIS WITH CONTRAST 08/16/2024 03:23:46 PM TECHNIQUE: CTA images of the abdomen and pelvis with intravenous contrast. 100 mL iohexol  (OMNIPAQUE ) 350 MG/ML injection. Three-dimensional MIP/volume rendered formations were performed. Automated exposure control, iterative reconstruction, and/or weight based adjustment of the mA/kV was utilized to reduce the radiation dose to as low as reasonably achievable. COMPARISON: 05/15/2024 and 06/03/2024. CLINICAL HISTORY: Suspected GI bleed. FINDINGS: VASCULATURE: GI BLEED: No active extravasation of contrast within the GI tract. AORTA: No acute finding. No abdominal aortic aneurysm. No dissection. CELIAC TRUNK: No acute finding. No occlusion or significant stenosis. SUPERIOR MESENTERIC ARTERY: No acute finding. No occlusion or significant stenosis. INFERIOR MESENTERIC ARTERY: No acute finding. No occlusion or significant stenosis. RENAL ARTERIES: No acute finding. No occlusion or significant stenosis. ILIAC ARTERIES: No acute finding. No occlusion or significant stenosis. ABDOMEN/PELVIS: LOWER CHEST: Visualized portion of the lower chest demonstrates no acute abnormality. LIVER: Hepatic low density is again noted consistent with metastatic disease. GALLBLADDER AND BILE DUCTS: Cholelithiasis. No biliary ductal dilatation. SPLEEN: The spleen is unremarkable. PANCREAS: The pancreas is unremarkable. ADRENAL GLANDS: Bilateral adrenal glands demonstrate no acute abnormality. KIDNEYS, URETERS AND BLADDER: No stones in the kidneys or ureters. No hydronephrosis. No perinephric or periureteral stranding. Urinary bladder is unremarkable. GI AND BOWEL: Severely dilated small bowel loops are noted with intramural gas in multiple small bowel loops consistent with ischemic bowel disease. No colonic dilatation is  noted. Stomach and duodenal sweep demonstrate no acute abnormality. REPRODUCTIVE: Reproductive organs are unremarkable. PERITONEUM AND RETROPERITONEUM: Mild ascites is noted. 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 No free air. LYMPH NODES: No lymphadenopathy. BONES AND SOFT TISSUES: Status post bilateral hip arthroplasties. No acute abnormality of the bones. No acute soft tissue abnormality. IMPRESSION: 1. Severely dilated small bowel loops with intramural gas, consistent with ischemic bowel disease. This finding was discussed with Dr. Melvenia at 3:38 pm on 08/16/2024. 2. No active gastrointestinal bleeding identified. 3. Hepatic low-density lesions consistent with metastatic disease. 4. 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 Electronically signed by: Lynwood Seip MD 08/16/2024 03:40 PM EST RP Workstation: [id number]      Vernal Alstrom, MD  [facility] 08/18/2024  If 7PM-7AM, please contact night-coverage

## 2024-08-18 NOTE — Progress Notes (Signed)
 Hypoglycemic Event  CBG: 63 at 2230  Treatment: D50 25 mL (12.5 gm)  Symptoms: None  Follow-up CBG: Time:2301 CBG Result:111  Possible Reasons for Event: Other: Pt NPO   Comments/MD notified:J. Blondie NP     Truett Marvin Carr

## 2024-08-18 NOTE — Plan of Care (Signed)
   Problem: Education: Goal: Knowledge of General Education information will improve Description: Including pain rating scale, medication(s)/side effects and non-pharmacologic comfort measures Outcome: Progressing   Problem: Health Behavior/Discharge Planning: Goal: Ability to manage health-related needs will improve Outcome: Progressing   Problem: Activity: Goal: Risk for activity intolerance will decrease Outcome: Progressing   Problem: Elimination: Goal: Will not experience complications related to urinary retention Outcome: Progressing

## 2024-08-18 NOTE — Plan of Care (Signed)
  Problem: Pain Managment: Goal: General experience of comfort will improve and/or be controlled Outcome: Progressing   Problem: Safety: Goal: Ability to remain free from injury will improve Outcome: Progressing   Problem: Skin Integrity: Goal: Risk for impaired skin integrity will decrease Outcome: Progressing

## 2024-08-18 NOTE — Plan of Care (Signed)

## 2024-08-18 NOTE — Progress Notes (Signed)
 Cardiology Progress Note  Patient ID: Marvin Carr MRN: [id number] DOB: [birth year] Date of Encounter: 08/18/2024 Primary Cardiologist: Jayson Sierras, MD  Subjective   Chief Complaint: None  HPI: Reports he is passing flatus.  Hemoglobin values trending down.  ROS:  All other ROS reviewed and negative. Pertinent positives noted in the HPI.     Telemetry  Overnight telemetry shows sinus rhythm 70s, which I personally reviewed.   ECG  The most recent ECG shows sinus tachycardia heart rate 117, no acute ischemic changes, which I personally reviewed.   Physical Exam   Vitals:   08/17/24 1444 08/17/24 1836 08/17/24 2036 08/18/24 0609  BP: 104/63 105/69 104/69 103/66  Pulse: 96 95 94 91  Resp: 16 16 15 16   Temp: 98 F (36.7 C) 97.6 F (36.4 C) 98.1 F (36.7 C) 98.4 F (36.9 C)  TempSrc:      SpO2: 94% 95% 96% 97%  Weight:      Height:        Intake/Output Summary (Last 24 hours) at 08/18/2024 0927 Last data filed at 08/18/2024 0000 Gross per 24 hour  Intake --  Output 1000 ml  Net -1000 ml       08/16/2024   12:58 PM 08/05/2024    8:11 AM 08/04/2024    9:41 AM  Last 3 Weights  Weight (lbs) 255 lb 261 lb 9.6 oz 263 lb 9.6 oz  Weight (kg) 115.667 kg 118.661 kg 119.568 kg    Body mass index is 34.58 kg/m.  General: Well nourished, well developed, in no acute distress Head: Atraumatic, normal size  Eyes: PEERLA, EOMI  Neck: Supple, no JVD Endocrine: No thryomegaly Cardiac: Normal S1, S2; RRR; no murmurs, rubs, or gallops Lungs: Clear to auscultation bilaterally, no wheezing, rhonchi or rales  Abd: Distended abdomen Ext: No edema, pulses 2+ Musculoskeletal: No deformities, BUE and BLE strength normal and equal Skin: Warm and dry, no rashes   Neuro: Alert and oriented to person, place, time, and situation, CNII-XII grossly intact, no focal deficits  Psych: Normal mood and affect   Cardiac Studies  TTE 07/04/2024   Mid Cx to Dist Cx lesion is 80% stenosed.    2nd Diag lesion is 95% stenosed.   A stent was successfully placed.   Post intervention, there is a 0% residual stenosis.   1.  High-grade distal left circumflex lesion treated with one 3.5 x 16 mm Synergy XD stent postdilated to 3.75 mm. 2.  LVEDP of 20 mmHg.  TTE 07/03/2024  1. Left ventricular ejection fraction, by estimation, is 60 to 65%. The  left ventricle has normal function. The left ventricle has no regional  wall motion abnormalities. Left ventricular diastolic parameters are  consistent with Grade I diastolic  dysfunction (impaired relaxation).   2. Right ventricular systolic function is normal. The right ventricular  size is normal.   3. The mitral valve is normal in structure. No evidence of mitral valve  regurgitation. No evidence of mitral stenosis.   4. The aortic valve was not well visualized. Aortic valve regurgitation  is not visualized. No aortic stenosis is present.   5. Aortic dilatation noted. There is mild dilatation of the aortic root,  measuring 41 mm.   6. The inferior vena cava is normal in size with greater than 50%  respiratory variability, suggesting right atrial pressure of 3 mmHg.   Patient Profile  Kyrie Fludd is a 68 y.o. male with metastatic cholangiocarcinoma, CAD status post PCI to left circumflex (  07/04/2024), HFpEF, diabetes, hypertension admitted on 08/16/2024 with bowel obstruction and concerns for ischemic colitis.  Cardiology consulted for antiplatelet management.  Assessment & Plan   # Small bowel obstruction # Concerns for ischemic colitis # Acute blood loss anemia # CAD with PCI to mid circumflex on 07/04/2024 - Patient has known metastatic cholangiocarcinoma.  Now here with concerns for ischemic colitis and small bowel obstruction. - Hemoglobin values are trending down.  Would recommend to hold DAPT until stabilized.  Suspect we will go back on Brilinta  monotherapy.  Aspirin  is likely more of a bleeding offender. - Regardless, he  is 6 weeks out from his recent intervention it is okay to hold DAPT until bleeding has resolved. - Resume Lipitor when strict n.p.o. status has been lifted.  # Metastatic cholangiocarcinoma # Hypertension # Diabetes  # HFpEF - Per primary team.  Volume status acceptable.     For questions or updates, please contact [facility] Please consult [email or web address] for contact info under        Signed, Darryle T. Barbaraann, MD, [facility]  [facility]  08/18/2024 9:27 AM

## 2024-08-19 ENCOUNTER — Inpatient Hospital Stay (HOSPITAL_COMMUNITY)

## 2024-08-19 ENCOUNTER — Encounter (HOSPITAL_COMMUNITY): Payer: Self-pay | Admitting: Family Medicine

## 2024-08-19 DIAGNOSIS — K559 Vascular disorder of intestine, unspecified: Secondary | ICD-10-CM | POA: Diagnosis not present

## 2024-08-19 LAB — CBC
HCT: 29.9 % — ABNORMAL LOW (ref 39.0–52.0)
Hemoglobin: 9.4 g/dL — ABNORMAL LOW (ref 13.0–17.0)
MCH: 30.8 pg (ref 26.0–34.0)
MCHC: 31.4 g/dL (ref 30.0–36.0)
MCV: 98 fL (ref 80.0–100.0)
Platelets: 180 K/uL (ref 150–400)
RBC: 3.05 MIL/uL — ABNORMAL LOW (ref 4.22–5.81)
RDW: 18.7 % — ABNORMAL HIGH (ref 11.5–15.5)
WBC: 5.8 K/uL (ref 4.0–10.5)
nRBC: 1.2 % — ABNORMAL HIGH (ref 0.0–0.2)

## 2024-08-19 LAB — GLUCOSE, CAPILLARY
Glucose-Capillary: 138 mg/dL — ABNORMAL HIGH (ref 70–99)
Glucose-Capillary: 145 mg/dL — ABNORMAL HIGH (ref 70–99)
Glucose-Capillary: 79 mg/dL (ref 70–99)
Glucose-Capillary: 88 mg/dL (ref 70–99)
Glucose-Capillary: 94 mg/dL (ref 70–99)
Glucose-Capillary: 97 mg/dL (ref 70–99)

## 2024-08-19 LAB — BASIC METABOLIC PANEL WITH GFR
Anion gap: 11 (ref 5–15)
BUN: 20 mg/dL (ref 8–23)
CO2: 27 mmol/L (ref 22–32)
Calcium: 8.2 mg/dL — ABNORMAL LOW (ref 8.9–10.3)
Chloride: 100 mmol/L (ref 98–111)
Creatinine, Ser: 1.29 mg/dL — ABNORMAL HIGH (ref 0.61–1.24)
GFR, Estimated: >60 mL/min (ref >60)
Glucose, Bld: 91 mg/dL (ref 70–99)
Potassium: 3.7 mmol/L (ref 3.5–5.1)
Sodium: 138 mmol/L (ref 135–145)

## 2024-08-19 LAB — PHOSPHORUS: Phosphorus: 2.8 mg/dL (ref 2.5–4.6)

## 2024-08-19 LAB — MAGNESIUM: Magnesium: 2.4 mg/dL (ref 1.7–2.4)

## 2024-08-19 MED ORDER — DEXTROSE-SODIUM CHLORIDE 5-0.9 % IV SOLN: INTRAVENOUS | Status: AC

## 2024-08-19 MED ORDER — DEXTROSE-SODIUM CHLORIDE 5-0.9 % IV SOLN: INTRAVENOUS | Status: DC

## 2024-08-19 NOTE — Progress Notes (Addendum)
 PROGRESS NOTE  Marvin Carr FMW:[id number] DOB: 02/14/1956 DOA: 08/16/2024 PCP: [name], MD   LOS: 3 days   Brief narrative:  Marvin Carr is a 68 y.o. 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  In the ED, CT angiogram showed severely dilated small bowel loops with intramural gas, consistent with ischemic bowel disease.  Low-density lesions consistent with metastatic disease with irregular omental densities concerning for peritoneal carcinomatosis.  Urinalysis was unremarkable lipase was 20.  Hemoglobin of 11.0 with creatinine of 1.1.  ED provider spoke with on-call general surgery and patient was admitted hospital for further evaluation.    Assessment/Plan: Principal Problem:   Ischemic bowel syndrome Active Problems:   Essential hypertension   Peritoneal carcinomatosis (HCC)   Cholangiocarcinoma metastatic to liver (HCC)   IDA (iron deficiency anemia)   S/P primary angioplasty with coronary stent   Ischemic bowel disease  Concern for ischemic bowel/bowel obstruction - CT scan of abdomen consistent with ischemic bowel, peritoneal carcinomatosis.    Continue IV Zosyn , Protonix , n.p.o,. IV fluids antiemetics.  General surgery on board and recommend NG tube and bowel rest.  + Flatus.  General surgery following has had 2800 mL NG tube output.   Metastatic cholangiocarcinoma  Diagnosed in 2022 with peritoneal carcinomatosis and metastatic disease to the liver and bile duct. Patient was initially initially treated with cisplatin , gemcitabine , and durvalumab  for 6 months despite that had disease progression in 2025,  treatment delayed by hip replacement in May 2025.  He was transitioned to FOLFOX 06/2024.  High risk for any surgical intervention.  Oncology following the patient.   History of CAD s/p PCI to the Lcx (stent placed 07/04/24) patient was on Brilinta /aspirin  as outpatient with plan to continue dual antiplatelet therapy for at least 6 months but preferably 1 year with aspirin  and Brilinta  followed by Brilinta  monotherapy indefinitely.  Lipitor currently on hold.  Continue metoprolol .  Cardiology on board and dual platelets on hold at this time  HFpEF  review of 2D echocardiogram from  07/03/2024 with EF of 60 to 65%, grade 1 diastolic dysfunction noted.  Diuretics on hold.   Diabetes mellitus type 2. Recent hemoglobin A1C is 5.7 reflecting excellent diabetic control PTA.  Continue to hold metformin  and Farxiga .  Continue sliding scale insulin , Accu-Cheks.  Currently NPO.  POC glucose of 95  Mild hypokalemia.  Improved after repletion.  Latest potassium of 3.7.   Essential hypertension-patient is on Procardia  and metoprolol  at home.  Currently being monitored.  Acute kidney injury. Creatinine of 1.6 from baseline around 1.1.   Mild anemia leukopenia will continue to monitor.    Goals of care.  Palliative care on board.  DNR.  DVT prophylaxis: heparin  injection 5,000 Units Start: 08/16/24 2200 SCDs Start: 08/16/24 1848 Place TED hose Start: 08/16/24 1848   Disposition: Uncertain at this time.  Likely home with 2 to 3 days will get PT OT evaluation.  Status is: Inpatient Remains inpatient appropriate because: Pending clinical improvement,    Code Status:     Code Status: Limited: Do not attempt resuscitation (DNR) -DNR-LIMITED -Do Not Intubate/DNI   Family Communication: Spoke with the patient's friend at bedside on 08/17/2024  Consultants: General Surgery Cardiology Palliative care  Procedures: NG tube placement  Anti-infectives:  Zosyn  IV  Anti-infectives (From admission,  onward)    Start     Dose/Rate Route Frequency Ordered Stop   08/17/24 0000  piperacillin -tazobactam (ZOSYN ) IVPB 3.375 g        3.375 g 12.5 mL/hr over 240 Minutes Intravenous Every 8 hours 08/16/24 1844     08/16/24 1545  piperacillin -tazobactam (ZOSYN ) IVPB 3.375 g        3.375 g 100 mL/hr over 30 Minutes Intravenous  Once 08/16/24 1543 08/16/24 1800        Subjective: Today, patient was seen and examined at bedside.  Patient states that he feels okay.  Denies any nausea vomiting or abdominal pain.  Has had good NG output.  Had passed flatus but no bowel movement.  Feels hungry  Objective: Vitals:   08/19/24 0901 08/19/24 1000  BP: 134/69 116/76  Pulse: 86 85  Resp:    Temp:  98 F (36.7 C)  SpO2:  100%    Intake/Output Summary (Last 24 hours) at 08/19/2024 1004 Last data filed at 08/19/2024 0943 Gross per 24 hour  Intake 1463.41 ml  Output 2450 ml  Net -986.59 ml   Filed Weights   08/16/24 1258  Weight: 115.7 kg   Body mass index is 34.58 kg/m.   Physical Exam:  General: Obese built, not in obvious distress, NG tube in place HENT:   No scleral pallor or icterus noted. Oral mucosa is moist.  Chest:    Diminished breath sounds bilaterally. No crackles or wheezes.  CVS: S1 &S2 heard. No murmur.  Regular rate and rhythm. Abdomen: Soft, , bowel sounds present.  Mild distention.  Nontender. Extremities: No cyanosis, clubbing or edema.  Peripheral pulses are palpable. Psych: Alert, awake and oriented, normal mood CNS:  No cranial nerve deficits.  Moves all extremities. Skin: Warm and dry.  No rashes noted.   Data Review: I have personally reviewed the following laboratory data and studies,  CBC: Recent Labs  Lab 08/12/24 1223 08/16/24 1325 08/17/24 0208 08/18/24 0435 08/18/24 2358  WBC 7.1 3.0* 2.2* 5.3 5.8  NEUTROABS 5.2 1.9  --   --   --   HGB 9.6* 11.0* 10.8* 9.0* 9.4*  HCT 30.5* 34.0* 32.5* 28.2* 29.9*  MCV 96.5 95.2 91.5 96.6 98.0  PLT 173 178 170  169 180   Basic Metabolic Panel: Recent Labs  Lab 08/12/24 1223 08/16/24 1325 08/17/24 0208 08/18/24 0435 08/18/24 2358  NA 137 136 138 138 138  K 3.4* 3.6 3.3* 3.1* 3.7  CL 99 98 99 100 100  CO2 24 22 28 29 27   GLUCOSE 140* 147* 125* 80 91  BUN 19 20 23  24* 20  CREATININE 1.17 1.17 1.67* 1.48* 1.29*  CALCIUM  8.6* 9.1 8.3* 8.0* 8.2*  MG 1.4*  --   --  1.8 2.4  PHOS  --   --   --   --  2.8   Liver Function Tests: Recent Labs  Lab 08/12/24 1223 08/16/24 1325 08/17/24 0208  AST 25 100* 107*  ALT 17 75* 87*  ALKPHOS 90 141* 122  BILITOT 0.3 0.9 1.2  PROT 5.9* 6.2* 5.3*  ALBUMIN  3.4* 3.3* 2.2*   Recent Labs  Lab 08/16/24 1325  LIPASE 20   No results for input(s): AMMONIA in the last 168 hours. Cardiac Enzymes: No results for input(s): CKTOTAL, CKMB, CKMBINDEX, TROPONINI in the last 168 hours. BNP (last 3 results) Recent  Labs    05/15/24 1758  BNP 115.0*    ProBNP (last 3 results) Recent Labs    07/03/24 0023  PROBNP 218.0    CBG: Recent Labs  Lab 08/18/24 2230 08/18/24 2301 08/19/24 0008 08/19/24 0322 08/19/24 0808  GLUCAP 63* 111* 79 97 94   No results found for this or any previous visit (from the past 240 hours).   Studies: DG Abd Portable 1V Result Date: 08/19/2024 CLINICAL DATA:  Bowel obstruction. EXAM: PORTABLE ABDOMEN - 1 VIEW COMPARISON:  08/18/2024 FINDINGS: NG tube tip is in the proximal stomach. Although side port of the NG tube is not clearly visualized, it is likely in the distal esophagus. Gaseous distention of transverse colon noted without frank dilatation. Gas is visible in the nondistended rectum. Air is seen scattered through nondilated small bowel loops. IMPRESSION: 1. NG tube tip is in the proximal stomach. Although side port of the NG tube is not clearly visualized, it is likely in the distal esophagus. 2. Nonspecific bowel gas pattern. Electronically Signed   By: Camellia Candle M.D.   On: 08/19/2024 07:21   DG Abd 1  View Result Date: 08/18/2024 CLINICAL DATA:  Enteric catheter placement EXAM: ABDOMEN - 1 VIEW COMPARISON:  08/17/2024 FINDINGS: Frontal view of the lower chest and upper abdomen demonstrates stable position of the enteric catheter, tip projecting over the gastric fundus. Side port projects proximally 2.5 cm above the left hemidiaphragm. Bowel gas pattern is unremarkable. Stable left pleural effusion and left lower lobe consolidation. IMPRESSION: 1. Enteric catheter tip projecting over the gastric fundus, with side port projecting 2.5 cm above the left hemidiaphragm. 2. Stable left pleural effusion and left basilar consolidation. Electronically Signed   By: Ozell Daring M.D.   On: 08/18/2024 17:10      Vernal Alstrom, MD  [facility] 08/19/2024  If 7PM-7AM, please contact night-coverage

## 2024-08-19 NOTE — Progress Notes (Signed)
 Progress Note     Subjective: Patient denies abdominal pain. Reports flatulence. Denies BM, n/v.   ROS  All negative with the exception of above.  Objective: Vital signs in last 24 hours: Temp:  [97.3 F (36.3 C)-98.1 F (36.7 C)] 97.8 F (36.6 C) (12/09 0535) Pulse Rate:  [84-95] 84 (12/09 0535) Resp:  [16-18] 18 (12/09 0535) BP: (105-119)/(65-78) 116/78 (12/09 0535) SpO2:  [97 %-98 %] 97 % (12/09 0535) Last BM Date : 08/16/24  Intake/Output from previous day: 12/08 0701 - 12/09 0700 In: 1563.4 [P.O.:500; I.V.:61.9; NG/GT:400; IV Piggyback:601.5] Out: 3000 [Urine:200; Emesis/NG output:2800] Intake/Output this shift: No intake/output data recorded.  PE: General: Pleasant male who is laying in bed in NAD. HEENT: Head is normocephalic, atraumatic. Sclera are noninjected.  Heart: HR normal during encounter.  Lungs: Respiratory effort nonlabored. Abd: Soft with mild distention. Minimal generalized tenderness to palpation. No rebound tenderness or guarding.  MS: Able to move all 4 extremities.  Skin: Warm and dry.  Psych: A&Ox3 with an appropriate affect.    Lab Results:  Recent Labs    08/18/24 0435 08/18/24 2358  WBC 5.3 5.8  HGB 9.0* 9.4*  HCT 28.2* 29.9*  PLT 169 180   BMET Recent Labs    08/18/24 0435 08/18/24 2358  NA 138 138  K 3.1* 3.7  CL 100 100  CO2 29 27  GLUCOSE 80 91  BUN 24* 20  CREATININE 1.48* 1.29*  CALCIUM  8.0* 8.2*   PT/INR Recent Labs    08/16/24 1325  LABPROT 15.1  INR 1.1   CMP     Component Value Date/Time   NA 138 08/18/2024 2358   K 3.7 08/18/2024 2358   CL 100 08/18/2024 2358   CO2 27 08/18/2024 2358   GLUCOSE 91 08/18/2024 2358   BUN 20 08/18/2024 2358   CREATININE 1.29 (H) 08/18/2024 2358   CALCIUM  8.2 (L) 08/18/2024 2358   PROT 5.3 (L) 08/17/2024 0208   ALBUMIN  2.2 (L) 08/17/2024 0208   AST 107 (H) 08/17/2024 0208   ALT 87 (H) 08/17/2024 0208   ALKPHOS 122 08/17/2024 0208   BILITOT 1.2 08/17/2024 0208    GFRNONAA >60 08/18/2024 2358   GFRAA >90 06/11/2014 0558   Lipase     Component Value Date/Time   LIPASE 20 08/16/2024 1325       Studies/Results: DG Abd Portable 1V Result Date: 08/19/2024 CLINICAL DATA:  Bowel obstruction. EXAM: PORTABLE ABDOMEN - 1 VIEW COMPARISON:  08/18/2024 FINDINGS: NG tube tip is in the proximal stomach. Although side port of the NG tube is not clearly visualized, it is likely in the distal esophagus. Gaseous distention of transverse colon noted without frank dilatation. Gas is visible in the nondistended rectum. Air is seen scattered through nondilated small bowel loops. IMPRESSION: 1. NG tube tip is in the proximal stomach. Although side port of the NG tube is not clearly visualized, it is likely in the distal esophagus. 2. Nonspecific bowel gas pattern. Electronically Signed   By: Camellia Candle M.D.   On: 08/19/2024 07:21   DG Abd 1 View Result Date: 08/18/2024 CLINICAL DATA:  Enteric catheter placement EXAM: ABDOMEN - 1 VIEW COMPARISON:  08/17/2024 FINDINGS: Frontal view of the lower chest and upper abdomen demonstrates stable position of the enteric catheter, tip projecting over the gastric fundus. Side port projects proximally 2.5 cm above the left hemidiaphragm. Bowel gas pattern is unremarkable. Stable left pleural effusion and left lower lobe consolidation. IMPRESSION: 1. Enteric catheter tip  projecting over the gastric fundus, with side port projecting 2.5 cm above the left hemidiaphragm. 2. Stable left pleural effusion and left basilar consolidation. Electronically Signed   By: Ozell Daring M.D.   On: 08/18/2024 17:10    Anti-infectives: Anti-infectives (From admission, onward)    Start     Dose/Rate Route Frequency Ordered Stop   08/17/24 0000  piperacillin -tazobactam (ZOSYN ) IVPB 3.375 g        3.375 g 12.5 mL/hr over 240 Minutes Intravenous Every 8 hours 08/16/24 1844     08/16/24 1545  piperacillin -tazobactam (ZOSYN ) IVPB 3.375 g        3.375  g 100 mL/hr over 30 Minutes Intravenous  Once 08/16/24 1543 08/16/24 1800        Assessment/Plan 68 y.o. male with metastatic cholangiocarcinoma with peritoneal carcinomatosis who presented with abdominal pain, found to have bowel obstruction with pneumatosis on CT  -Xray reviewed. -Afebrile. -Denies pain. Having flatulence. Denies n/v/bm.  -NGT output noted to be from 12/8-12/9. -Keep NPO and maintain NGT. Patient is eager to have diet. Will discuss further with MD as NGT output remains high. -Ongoing care with cardiology, palliative and oncology. -Given his metastatic disease, intra-abdominal carcinomatosis, and ascites, he is not a candidate for an open G-tube. If he does not improve, consideration could be made to get IR's opinion regarding a perc g-tube. -Will continue to follow.    FEN: NPO/NGT; IVF per primary team VTE:Heparin  injection ID:Zosyn     LOS: 3 days   I reviewed hospitalist notes, specialist notes, nursing notes, last 24 h vitals and pain scores, last 48 h intake and output, last 24 h labs and trends, and last 24 h imaging results.  This care required moderate level of medical decision making.    Marjorie Carlyon Favre, [facility] 08/19/2024, 8:43 AM Please see Amion for pager number during day hours 7:00am-4:30pm

## 2024-08-19 NOTE — Evaluation (Signed)
 Physical Therapy Evaluation Patient Details Name: Marvin Carr MRN: [id number] DOB: 12-Jul-1956 Today's Date: 08/19/2024  History of Present Illness  Patient is a 68 year old male who presented with abdominal pain. PMHx of nonobstructive CAD, HTN, T2DM, cholangiocarcinoma with liver lesions, peritoneal carcinomatosis, malignant ascites, depression, anxiety, insomnia, OSA on CPAP, cardiac cath on 10/24   Clinical Impression  Pt admitted with above. Pt functioning near baseline however does have increased WOB and SOB with mobility in addition to laying flat requiring 2Lo2 via [place]. Acute PT to cont to monitor while in hospital however I anticipate pt to progress well and be able to return home once medically stable without follow up therapy.         If plan is discharge home, recommend the following:     Can travel by private vehicle        Equipment Recommendations None recommended by PT  Recommendations for Other Services       Functional Status Assessment Patient has had a recent decline in their functional status and demonstrates the ability to make significant improvements in function in a reasonable and predictable amount of time.     Precautions / Restrictions Precautions Precautions: Other (comment) Precaution/Restrictions Comments: NG tube Restrictions Weight Bearing Restrictions Per Provider Order: No      Mobility  Bed Mobility Overal bed mobility: Modified Independent             General bed mobility comments: HOB elevated, use of bed rail    Transfers Overall transfer level: Modified independent Equipment used: None               General transfer comment: no difficulty    Ambulation/Gait Ambulation/Gait assistance: Supervision Gait Distance (Feet): 150 Feet Assistive device: IV Pole Gait Pattern/deviations: Step-through pattern, Decreased stride length Gait velocity: dec compared to baseline     General Gait Details: noted SOB, increased  WOB but SPO2 > 95% on RA  Stairs            Wheelchair Mobility     Tilt Bed    Modified [name] (Stroke Patients Only)       Balance Overall balance assessment: Mild deficits observed, not formally tested                                           Pertinent Vitals/Pain Pain Assessment Pain Assessment: No/denies pain    Home Living Family/patient expects to be discharged to:: Private residence Living Arrangements: Spouse/significant other;Alone Available Help at Discharge: Family;Friend(s);Available 24 hours/day Type of Home: House Home Access: Stairs to enter Entrance Stairs-Rails: Lawyer of Steps: 3   Home Layout: One level Home Equipment: Agricultural Consultant (2 wheels);Cane - single point      Prior Function Prior Level of Function : Independent/Modified Independent             Mobility Comments: uses cane PRN, works from home ADLs Comments: indep     Extremity/Trunk Assessment   Upper Extremity Assessment Upper Extremity Assessment: Generalized weakness    Lower Extremity Assessment Lower Extremity Assessment: Generalized weakness    Cervical / Trunk Assessment Cervical / Trunk Assessment: Normal  Communication   Communication Communication: No apparent difficulties    Cognition Arousal: Alert Behavior During Therapy: WFL for tasks assessed/performed   PT - Cognitive impairments: No apparent impairments  Following commands: Intact       Cueing Cueing Techniques: Verbal cues     General Comments General comments (skin integrity, edema, etc.): SpO2 at 95% on RA during ambulation however once returned to bed pts SpO2 at 91% on RA and noted to have a harder time breathing, pt returned to 2Lo2 via [place]    Exercises     Assessment/Plan    PT Assessment Patient needs continued PT services  PT Problem List Decreased strength;Decreased activity tolerance        PT Treatment Interventions DME instruction;Gait training;Stair training;Functional mobility training;Therapeutic activities;Therapeutic exercise    PT Goals (Current goals can be found in the Care Plan section)  Acute Rehab PT Goals Patient Stated Goal: home PT Goal Formulation: All assessment and education complete, DC therapy Additional Goals Additional Goal #1: Pt to score > 19 on DGI to indicate minimal falls risk.    Frequency Min 1X/week     Co-evaluation               AM-PAC PT 6 Clicks Mobility  Outcome Measure Help needed turning from your back to your side while in a flat bed without using bedrails?: None Help needed moving from lying on your back to sitting on the side of a flat bed without using bedrails?: None Help needed moving to and from a bed to a chair (including a wheelchair)?: None Help needed standing up from a chair using your arms (e.g., wheelchair or bedside chair)?: None Help needed to walk in hospital room?: A Little Help needed climbing 3-5 steps with a railing? : A Little 6 Click Score: 22    End of Session   Activity Tolerance: Patient tolerated treatment well Patient left: in bed;with call bell/phone within reach Nurse Communication: Mobility status PT Visit Diagnosis: Unsteadiness on feet (R26.81)    Time: 1310-1336 PT Time Calculation (min) (ACUTE ONLY): 26 min   Charges:   PT Evaluation $PT Eval Low Complexity: 1 Low PT Treatments $Gait Training: 8-22 mins PT General Charges $$ ACUTE PT VISIT: 1 Visit         Norene Ames, PT, DPT Acute Rehabilitation Services Secure chat preferred Office #: [phone]   Norene CHRISTELLA Ames 08/19/2024, 2:51 PM

## 2024-08-19 NOTE — Progress Notes (Signed)
 Cardiology Progress Note  Patient ID: Marvin Carr MRN: [id number] DOB: 14-Mar-1956 Date of Encounter: 08/19/2024 Primary Cardiologist: Jayson Sierras, MD  Subjective   Chief Complaint: He reports he is hungry  HPI: Having flatus.  No bowel movement.  Denies chest pain or trouble breathing.  Hemoglobin is stable.  ROS:  All other ROS reviewed and negative. Pertinent positives noted in the HPI.     Telemetry  Overnight telemetry shows sinus rhythm 50 to 60 bpm, which I personally reviewed.    Physical Exam   Vitals:   08/18/24 1738 08/18/24 2152 08/19/24 0535 08/19/24 0901  BP: 116/73 119/72 116/78 134/69  Pulse: 95 94 84 86  Resp: 17 16 18    Temp: 97.6 F (36.4 C) 98.1 F (36.7 C) 97.8 F (36.6 C)   TempSrc: Axillary Oral Oral   SpO2: 98% 98% 97%   Weight:      Height:        Intake/Output Summary (Last 24 hours) at 08/19/2024 0918 Last data filed at 08/19/2024 0541 Gross per 24 hour  Intake 1463.41 ml  Output 3000 ml  Net -1536.59 ml       08/16/2024   12:58 PM 08/05/2024    8:11 AM 08/04/2024    9:41 AM  Last 3 Weights  Weight (lbs) 255 lb 261 lb 9.6 oz 263 lb 9.6 oz  Weight (kg) 115.667 kg 118.661 kg 119.568 kg    Body mass index is 34.58 kg/m.  General: Well nourished, well developed, in no acute distress Head: Atraumatic, normal size  Eyes: PEERLA, EOMI  Neck: Supple, no JVD Endocrine: No thryomegaly Cardiac: Normal S1, S2; RRR; no murmurs, rubs, or gallops Lungs: Clear to auscultation bilaterally, no wheezing, rhonchi or rales  Abd: Distended abdomen Ext: No edema, pulses 2+ Musculoskeletal: No deformities, BUE and BLE strength normal and equal Skin: Warm and dry, no rashes   Neuro: Alert and oriented to person, place, time, and situation, CNII-XII grossly intact, no focal deficits  Psych: Normal mood and affect   Cardiac Studies  TTE 07/03/2024  1. Left ventricular ejection fraction, by estimation, is 60 to 65%. The  left ventricle has  normal function. The left ventricle has no regional  wall motion abnormalities. Left ventricular diastolic parameters are  consistent with Grade I diastolic  dysfunction (impaired relaxation).   2. Right ventricular systolic function is normal. The right ventricular  size is normal.   3. The mitral valve is normal in structure. No evidence of mitral valve  regurgitation. No evidence of mitral stenosis.   4. The aortic valve was not well visualized. Aortic valve regurgitation  is not visualized. No aortic stenosis is present.   5. Aortic dilatation noted. There is mild dilatation of the aortic root,  measuring 41 mm.   6. The inferior vena cava is normal in size with greater than 50%  respiratory variability, suggesting right atrial pressure of 3 mmHg.   LHC 07/04/2024   Mid Cx to Dist Cx lesion is 80% stenosed.   2nd Diag lesion is 95% stenosed.   A stent was successfully placed.   Post intervention, there is a 0% residual stenosis.   1.  High-grade distal left circumflex lesion treated with one 3.5 x 16 mm Synergy XD stent postdilated to 3.75 mm. 2.  LVEDP of 20 mmHg.   Patient Profile  Marvin Carr is a 68 y.o. male with metastatic cholangiocarcinoma, CAD status post PCI to left circumflex (07/04/2024), HFpEF, diabetes, hypertension admitted on 08/16/2024 with  small bowel obstruction and concerns for ischemic colitis.  Cardiology consulted for antiplatelet management.  Assessment & Plan   # Small bowel obstruction # Concerns for ischemic bowel # Metastatic cholangiocarcinoma # Acute blood loss anemia # CAD with PCI to mid circumflex on 07/04/2024 - Admitted with bowel obstruction and concerns for ischemic bowel.  Recent PCI.  He is 6 weeks out.  We are holding aspirin  and Brilinta  since his hemoglobin is trending down.  We will likely resume Brilinta  monotherapy once he is more stable and procedures have been completed.  Unclear if he will require surgery at this time.  He is  passing flatus but no bowel movement.  Surgery mentions a possible PEG tube placement. - Ongoing palliative care discussions. - For now continue to hold aspirin  and Brilinta .  # CAD s/p PCI - Plan to restart Brilinta  monotherapy once surgical plan is finalized.  # Hypertension # HFpEF # Diabetes # Hyperlipidemia - Per hospital team     For questions or updates, please contact [facility] Please consult [email or web address] for contact info under        Signed, Darryle T. Barbaraann, MD, [facility]  [facility]  08/19/2024 9:18 AM

## 2024-08-19 NOTE — Progress Notes (Signed)
 Patient alerted staff to some drainage coming onto his bed sheets. Upon assessment this nurse found the patient's NG tube to be disconnected from the tubing. Patient stated It must have just happened I just felt it leaking on to me. Suction tubing reconnected and drainage observed. NG tube canister currently with 400 ml of fluid. Patient denies any n/v, abdominal pain, or fluctuance at this time. All needs met at this time. Bed in lowest position and call light within reach.

## 2024-08-19 NOTE — Plan of Care (Addendum)
 Pt is alert and oriented x 4. Up to bedside to use urinary independently. NG in place at 58. Connected to intermittent suction. Total 1500 out this shift. Pt has been taking in ice chips and sips with meds. Vitals stable. Last bm 12-6 but reports flatulence. Pt did have a low blood sugar at hs. 63. D50 given per protocol and NP JINNY Kipper Notified. Recheck 111. 30 min follow was 79 and NP ordered continuous D5NS at 20. A follow-up blood sugar was completed at 0330 to ensure Fluids not causing hyperglycemia. BS was 97. Oxy given x 1 this shift for back pain. Heat pack also applied and pt reported it was effective and was asleep at reassessment. Ambien  given 1 x for insomnia.  Problem: Education: Goal: Knowledge of General Education information will improve Description: Including pain rating scale, medication(s)/side effects and non-pharmacologic comfort measures Outcome: Progressing   Problem: Health Behavior/Discharge Planning: Goal: Ability to manage health-related needs will improve Outcome: Progressing   Problem: Clinical Measurements: Goal: Ability to maintain clinical measurements within normal limits will improve Outcome: Progressing Goal: Will remain free from infection Outcome: Progressing Goal: Diagnostic test results will improve Outcome: Progressing Goal: Respiratory complications will improve Outcome: Progressing Goal: Cardiovascular complication will be avoided Outcome: Progressing   Problem: Activity: Goal: Risk for activity intolerance will decrease Outcome: Progressing   Problem: Nutrition: Goal: Adequate nutrition will be maintained Outcome: Progressing   Problem: Coping: Goal: Level of anxiety will decrease Outcome: Progressing   Problem: Elimination: Goal: Will not experience complications related to bowel motility Outcome: Progressing Goal: Will not experience complications related to urinary retention Outcome: Progressing   Problem: Pain Managment: Goal:  General experience of comfort will improve and/or be controlled Outcome: Progressing   Problem: Safety: Goal: Ability to remain free from injury will improve Outcome: Progressing   Problem: Skin Integrity: Goal: Risk for impaired skin integrity will decrease Outcome: Progressing   Problem: Education: Goal: Ability to describe self-care measures that may prevent or decrease complications (Diabetes Survival Skills Education) will improve Outcome: Progressing Goal: Individualized Educational Video(s) Outcome: Progressing   Problem: Coping: Goal: Ability to adjust to condition or change in health will improve Outcome: Progressing   Problem: Fluid Volume: Goal: Ability to maintain a balanced intake and output will improve Outcome: Progressing   Problem: Health Behavior/Discharge Planning: Goal: Ability to identify and utilize available resources and services will improve Outcome: Progressing Goal: Ability to manage health-related needs will improve Outcome: Progressing   Problem: Metabolic: Goal: Ability to maintain appropriate glucose levels will improve Outcome: Progressing   Problem: Nutritional: Goal: Maintenance of adequate nutrition will improve Outcome: Progressing Goal: Progress toward achieving an optimal weight will improve Outcome: Progressing   Problem: Skin Integrity: Goal: Risk for impaired skin integrity will decrease Outcome: Progressing   Problem: Tissue Perfusion: Goal: Adequacy of tissue perfusion will improve Outcome: Progressing

## 2024-08-19 NOTE — Progress Notes (Signed)
   Palliative Medicine Inpatient Follow Up Note HPI: Marvin Carr is a 67 y.o. 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 Palliative care has been requested to support additional goals of care conversations.   Today's Discussion 08/19/2024  *Please note that this is a verbal dictation therefore any spelling or grammatical errors are due to the Dragon Medical One system interpretation.  I reviewed the chart notes including nursing notes from Kattie Thomas, progress notes from Dr. Sonjia, Dr. Barbaraann, & Marjorie Favre. I also reviewed vital signs, nursing flowsheets, medication administrations record, labs Hgb 9.4/Hct 29.9, and imaging.    Oral Intake %:  NPO I/O:  (-) Bowel Movements:  None (+) Flatulence Mobility: Can mobilize well with stability on feet  I met with Marvin Carr this morning. He is awake and alert. He shares with me frustration that he cannot eat. We discussed the reasons why this is. I shared that irregardless I would let surgery know to further discuss this with him.   We reviewed [place] current clinical condition(s). He denies pain, shortness of breath, or nausea. He had not had a BM but is passing gas. He is mobilizing regularly.   Tymel asks if he can take a shower, which I shared I would inform his primary hospitlist of.  Emotional support provided. Trev shares hope(s) for ongoing improvement.   Questions and concerns addressed/Palliative Support Provided.   Objective Assessment: Vital Signs Vitals:   08/19/24 0901 08/19/24 1000  BP: 134/69 116/76  Pulse: 86 85  Resp:    Temp:  98 F (36.7 C)   SpO2:  100%    Intake/Output Summary (Last 24 hours) at 08/19/2024 1151 Last data filed at 08/19/2024 1018 Gross per 24 hour  Intake 1583.41 ml  Output 2450 ml  Net -866.59 ml   Last Weight  Most recent update: 08/16/2024 12:58 PM    Weight  115.7 kg (255 lb)            Gen: Elderly Caucasian male HEENT: NGT, moist mucous membranes CV: Regular rate and rhythm  PULM: On RA nasal cannula ABD: Soft  EXT: No edema  Neuro: Alert and oriented x3   SUMMARY OF RECOMMENDATIONS   DNAR/DNI   Allowing time for outcomes   I was able to reach out to the patient experience department to support additional conversations between patient and the [facility] system   Ongoing palliative care support during hospitalization ______________________________________________________________________________________ Rosaline Becton [place] Palliative Medicine Team Team Cell Phone: [phone] Please utilize secure chat with additional questions, if there is no response within 30 minutes please call the above phone number  Time: 69  Palliative Medicine Team providers are available by phone from 7am to 7pm daily and can be reached through the team cell phone.  Should this patient require assistance outside of these hours, please call the patient's attending physician.

## 2024-08-19 NOTE — Progress Notes (Signed)
       Overnight   NAME: Marvin Carr MRN: [id number] DOB : Oct 22, 1955    Date of Service   08/19/2024   HPI/Events of Note    Notified by RN for Hypoglycemic event  Patient verifies to RN that he has been having trouble with it dropping at night. D50 protocol followed however patient did drop on 2nd follow up recheck, therefore.  Start D5NS at 20ml/hr due to NPO status and follow cbgs at regular intervals, adjust as needed.     Interventions/ Plan   D5ns will adjust as needed/indicated Recheck at 0300 Continue all Attending orders      Lynwood Kipper BSN MSNA MSN ACNPC-AG Acute Care Nurse Practitioner [facility]

## 2024-08-20 DIAGNOSIS — K559 Vascular disorder of intestine, unspecified: Secondary | ICD-10-CM | POA: Diagnosis not present

## 2024-08-20 LAB — CBC
HCT: 31.9 % — ABNORMAL LOW (ref 39.0–52.0)
Hemoglobin: 10 g/dL — ABNORMAL LOW (ref 13.0–17.0)
MCH: 30.2 pg (ref 26.0–34.0)
MCHC: 31.3 g/dL (ref 30.0–36.0)
MCV: 96.4 fL (ref 80.0–100.0)
Platelets: 197 K/uL (ref 150–400)
RBC: 3.31 MIL/uL — ABNORMAL LOW (ref 4.22–5.81)
RDW: 19 % — ABNORMAL HIGH (ref 11.5–15.5)
WBC: 7.4 K/uL (ref 4.0–10.5)
nRBC: 0.7 % — ABNORMAL HIGH (ref 0.0–0.2)

## 2024-08-20 LAB — GLUCOSE, CAPILLARY
Glucose-Capillary: 112 mg/dL — ABNORMAL HIGH (ref 70–99)
Glucose-Capillary: 121 mg/dL — ABNORMAL HIGH (ref 70–99)
Glucose-Capillary: 110 mg/dL — ABNORMAL HIGH (ref 70–99)
Glucose-Capillary: 139 mg/dL — ABNORMAL HIGH (ref 70–99)

## 2024-08-20 NOTE — Plan of Care (Signed)
  Problem: Pain Managment: Goal: General experience of comfort will improve and/or be controlled Outcome: Progressing   Problem: Safety: Goal: Ability to remain free from injury will improve Outcome: Progressing   Problem: Skin Integrity: Goal: Risk for impaired skin integrity will decrease Outcome: Progressing

## 2024-08-20 NOTE — Progress Notes (Signed)
 Cardiology Progress Note  Patient ID: Marvin Carr MRN: [id number] DOB: [birth year] Date of Encounter: 08/20/2024 Primary Cardiologist: Jayson Sierras, MD  Subjective   Chief Complaint: None.   HPI: Passing flatus.  No bowel movement yet.  I have ordered a CBC today.  ROS:  All other ROS reviewed and negative. Pertinent positives noted in the HPI.      Physical Exam   Vitals:   08/19/24 1000 08/19/24 1712 08/19/24 2106 08/20/24 0515  BP: 116/76 111/69 124/72 125/75  Pulse: 85 93 93 90  Resp:  17    Temp: 98 F (36.7 C) 98.1 F (36.7 C) 98.2 F (36.8 C)   TempSrc: Oral Oral Oral   SpO2: 100% 98% 98% (!) 89%  Weight:      Height:        Intake/Output Summary (Last 24 hours) at 08/20/2024 0909 Last data filed at 08/20/2024 0543 Gross per 24 hour  Intake 1616.93 ml  Output 1900 ml  Net -283.07 ml       08/16/2024   12:58 PM 08/05/2024    8:11 AM 08/04/2024    9:41 AM  Last 3 Weights  Weight (lbs) 255 lb 261 lb 9.6 oz 263 lb 9.6 oz  Weight (kg) 115.667 kg 118.661 kg 119.568 kg    Body mass index is 34.58 kg/m.  General: Well nourished, well developed, in no acute distress Head: Atraumatic, normal size  Eyes: PEERLA, EOMI  Neck: Supple, no JVD Endocrine: No thryomegaly Cardiac: Normal S1, S2; RRR; no murmurs, rubs, or gallops Lungs: Clear to auscultation bilaterally, no wheezing, rhonchi or rales  Abd: Distended abdomen Ext: No edema, pulses 2+ Musculoskeletal: No deformities, BUE and BLE strength normal and equal Skin: Warm and dry, no rashes   Neuro: Alert and oriented to person, place, time, and situation, CNII-XII grossly intact, no focal deficits  Psych: Normal mood and affect   Cardiac Studies  LHC 07/04/2024   Mid Cx to Dist Cx lesion is 80% stenosed.   2nd Diag lesion is 95% stenosed.   A stent was successfully placed.   Post intervention, there is a 0% residual stenosis.   1.  High-grade distal left circumflex lesion treated with one 3.5 x  16 mm Synergy XD stent postdilated to 3.75 mm. 2.  LVEDP of 20 mmHg.   Patient Profile  Marvin Carr is a 68 y.o. male with metastatic cholangiocarcinoma, CAD status post PCI to circumflex (07/04/2024), HFpEF, diabetes, hypertension admitted on 08/16/2024 with small bowel obstruction and concern for ischemic bowel.  Cardiology consulted for antiplatelet management.  Assessment & Plan   # Small bowel obstruction # Concerns for ischemic bowel # Metastatic cholangiocarcinoma # Acute blood loss anemia - I have ordered a CBC.  Had recent PCI to the circumflex on 07/04/2024.  6 weeks out.  Okay to hold DAPT.  We will likely plan for Brilinta  monotherapy moving forward to lower his bleeding risk. - Surgery still following.  NG tube in place.  No bowel movement.  Unclear if he will need procedures.  I will reach out to the hospital team about when we can restart Brilinta .  We will make sure his blood counts are stable on CBC before.  He will then need to be watched while on Brilinta  to make sure he does not have recurrent bleeding.  # CAD status post PCI to mid circumflex on 07/04/2024 - Admitted with bowel obstruction and ischemic bowel.  We are holding DAPT since he is 6 weeks out.  Plan to resume Brilinta  monotherapy once blood counts are stable and no need for procedures.     For questions or updates, please contact [facility] Please consult [email or web address] for contact info under        Signed, Darryle T. Barbaraann, MD, [facility] [place]  [facility]  08/20/2024 9:09 AM

## 2024-08-20 NOTE — Progress Notes (Signed)
°   08/20/24 1228  TOC Brief Assessment  Insurance and Status Reviewed  Patient has primary care physician Yes  Home environment has been reviewed alone  Prior level of function: independent has walker and cane if needed  Prior/Current Home Services No current home services  Social Drivers of Health Review SDOH reviewed no interventions necessary  Readmission risk has been reviewed Yes  Transition of care needs  (continue to follow)     TOC Team will continue to follow. Plan today to Clamp NG If not tolerated, only option is for IR to consider a PEG  PT continuing to work with patient , current recommendations no PT follow up no DME

## 2024-08-20 NOTE — Progress Notes (Signed)
 Mobility Specialist Progress Note:    08/20/24 1345  Mobility  Activity Ambulated with assistance (In hallway)  Level of Assistance Standby assist, set-up cues, supervision of patient - no hands on  Assistive Device Cane  Distance Ambulated (ft) 225 ft  Activity Response Tolerated well  Mobility Referral Yes  Mobility visit 1 Mobility  Mobility Specialist Start Time (ACUTE ONLY) 1334  Mobility Specialist Stop Time (ACUTE ONLY) 1345  Mobility Specialist Time Calculation (min) (ACUTE ONLY) 11 min   Received pt in chair having no complaints and agreeable to mobility. Pt was asymptomatic throughout ambulation and returned to room w/o fault. Left in chair w/ call bell in reach and all needs met.   Lavanda Pollack Mobility Specialist  Please contact via [employer] or  Rehab Office [phone]

## 2024-08-20 NOTE — Progress Notes (Signed)
 Progress Note     Subjective: Patient denies abdominal pain. Reports tolerating CLD yesterday. Denies nausea, vomiting, BM. Has had flatulence. Denies SOB.  ROS  All negative with the exception of above.  Objective: Vital signs in last 24 hours: Temp:  [98 F (36.7 C)-98.2 F (36.8 C)] 98.2 F (36.8 C) (12/09 2106) Pulse Rate:  [85-93] 90 (12/10 0515) Resp:  [17] 17 (12/09 1712) BP: (111-134)/(69-76) 125/75 (12/10 0515) SpO2:  [89 %-100 %] 89 % (12/10 0515) Last BM Date : 08/16/24  Intake/Output from previous day: 12/09 0701 - 12/10 0700 In: 1616.9 [P.O.:600; I.V.:1016.9] Out: 1900 [Emesis/NG output:1900] Intake/Output this shift: No intake/output data recorded.  PE: General: Pleasant male who is laying in bed in NAD. HEENT: Head is normocephalic, atraumatic. Sclera are noninjected.  Heart: HR normal during encounter.  Lungs: Respiratory effort nonlabored. Abd: Soft with mild distention. NT to palpation. No rebound tenderness or guarding.  MS: Able to move all 4 extremities.  Skin: Warm and dry.  Psych: A&Ox3 with an appropriate affect.    Lab Results:  Recent Labs    08/18/24 0435 08/18/24 2358  WBC 5.3 5.8  HGB 9.0* 9.4*  HCT 28.2* 29.9*  PLT 169 180   BMET Recent Labs    08/18/24 0435 08/18/24 2358  NA 138 138  K 3.1* 3.7  CL 100 100  CO2 29 27  GLUCOSE 80 91  BUN 24* 20  CREATININE 1.48* 1.29*  CALCIUM  8.0* 8.2*   PT/INR No results for input(s): LABPROT, INR in the last 72 hours. CMP     Component Value Date/Time   NA 138 08/18/2024 2358   K 3.7 08/18/2024 2358   CL 100 08/18/2024 2358   CO2 27 08/18/2024 2358   GLUCOSE 91 08/18/2024 2358   BUN 20 08/18/2024 2358   CREATININE 1.29 (H) 08/18/2024 2358   CALCIUM  8.2 (L) 08/18/2024 2358   PROT 5.3 (L) 08/17/2024 0208   ALBUMIN  2.2 (L) 08/17/2024 0208   AST 107 (H) 08/17/2024 0208   ALT 87 (H) 08/17/2024 0208   ALKPHOS 122 08/17/2024 0208   BILITOT 1.2 08/17/2024 0208    GFRNONAA >60 08/18/2024 2358   GFRAA >90 06/11/2014 0558   Lipase     Component Value Date/Time   LIPASE 20 08/16/2024 1325       Studies/Results: DG Abd Portable 1V Result Date: 08/19/2024 CLINICAL DATA:  Bowel obstruction. EXAM: PORTABLE ABDOMEN - 1 VIEW COMPARISON:  08/18/2024 FINDINGS: NG tube tip is in the proximal stomach. Although side port of the NG tube is not clearly visualized, it is likely in the distal esophagus. Gaseous distention of transverse colon noted without frank dilatation. Gas is visible in the nondistended rectum. Air is seen scattered through nondilated small bowel loops. IMPRESSION: 1. NG tube tip is in the proximal stomach. Although side port of the NG tube is not clearly visualized, it is likely in the distal esophagus. 2. Nonspecific bowel gas pattern. Electronically Signed   By: Camellia Candle M.D.   On: 08/19/2024 07:21   DG Abd 1 View Result Date: 08/18/2024 CLINICAL DATA:  Enteric catheter placement EXAM: ABDOMEN - 1 VIEW COMPARISON:  08/17/2024 FINDINGS: Frontal view of the lower chest and upper abdomen demonstrates stable position of the enteric catheter, tip projecting over the gastric fundus. Side port projects proximally 2.5 cm above the left hemidiaphragm. Bowel gas pattern is unremarkable. Stable left pleural effusion and left lower lobe consolidation. IMPRESSION: 1. Enteric catheter tip projecting over the  gastric fundus, with side port projecting 2.5 cm above the left hemidiaphragm. 2. Stable left pleural effusion and left basilar consolidation. Electronically Signed   By: Ozell Daring M.D.   On: 08/18/2024 17:10    Anti-infectives: Anti-infectives (From admission, onward)    Start     Dose/Rate Route Frequency Ordered Stop   08/17/24 0000  piperacillin -tazobactam (ZOSYN ) IVPB 3.375 g        3.375 g 12.5 mL/hr over 240 Minutes Intravenous Every 8 hours 08/16/24 1844     08/16/24 1545  piperacillin -tazobactam (ZOSYN ) IVPB 3.375 g        3.375  g 100 mL/hr over 30 Minutes Intravenous  Once 08/16/24 1543 08/16/24 1800        Assessment/Plan 68 y.o. male with metastatic cholangiocarcinoma with peritoneal carcinomatosis who presented with abdominal pain, found to have bowel obstruction with pneumatosis on CT  -Xray from 10/9 showed gaseous distention of the transverse colon noted without frank dilatation. Gas visible in the nondistended rectum. Air is seen scattered through nondilated small bowel loops.  -Afebrile. -Denies pain. Having flatulence. Denies n/v/bm.  -NGT output noted to be from 12/9-12/10. -Tolerated CLD yesterday. Will proceed with clamp trial today.  -Ongoing care with cardiology, palliative and oncology. -Given his metastatic disease, intra-abdominal carcinomatosis, and ascites, he is not a candidate for an open G-tube. If he does not improve, consideration could be made to get IR's opinion regarding a perc g-tube. -Will continue to follow.    FEN: CLD; IVF per primary team VTE:Heparin  injection ID:Zosyn    LOS: 4 days   I reviewed specialist notes, consulting physician notes, hospitalist notes, nursing notes, last 24 h vitals and pain scores, last 48 h intake and output, last 24 h labs and trends, and last 24 h imaging results.  This care required moderate level of medical decision making.    Marjorie Carlyon Favre, [facility] 08/20/2024, 7:41 AM Please see Amion for pager number during day hours 7:00am-4:30pm

## 2024-08-20 NOTE — Plan of Care (Signed)

## 2024-08-20 NOTE — Care Management Important Message (Signed)
 Important Message  Patient Details  Name: Marvin Carr MRN: [id number] Date of Birth: [birth year]   Important Message Given:  Yes - Medicare IM     Jennie Laneta Dragon 08/20/2024, 11:35 AM

## 2024-08-20 NOTE — Progress Notes (Signed)
 PROGRESS NOTE  Marvin Carr FMW:[id number] DOB: 1956-05-04 DOA: 08/16/2024 PCP: [name], MD   LOS: 4 days   Brief narrative:  Marvin Carr is a 68 y.o. 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  In the ED, CT angiogram showed severely dilated small bowel loops with intramural gas, consistent with ischemic bowel disease.  Low-density lesions consistent with metastatic disease with irregular omental densities concerning for peritoneal carcinomatosis.  Urinalysis was unremarkable lipase was 20.  Hemoglobin of 11.0 with creatinine of 1.1.  ED provider spoke with on-call general surgery and patient was admitted hospital for further evaluation.    Assessment/Plan: Principal Problem:   Ischemic bowel syndrome Active Problems:   Essential hypertension   Peritoneal carcinomatosis (HCC)   Cholangiocarcinoma metastatic to liver (HCC)   IDA (iron deficiency anemia)   S/P primary angioplasty with coronary stent   Ischemic bowel disease  Concern for ischemic bowel/bowel obstruction - CT scan of abdomen consistent with ischemic bowel, peritoneal carcinomatosis.   Patient was initially treated with n.p.o. IV fluids NG tube Protonix  and Zosyn  and antiemetics.  General surgery on board and at this time has been started on clears after clamping the NG tube.  Has been passing flatus but no bowel movement.    Metastatic cholangiocarcinoma  Diagnosed in 2022 with peritoneal carcinomatosis and metastatic disease to the liver and bile duct. Patient was initially initially treated with cisplatin , gemcitabine , and durvalumab  for 6  months despite that had disease progression in 2025, treatment delayed by hip replacement in May 2025.  Patient was transitioned to FOLFOX 06/2024.  High risk for any surgical intervention.  Oncology following the patient.   History of CAD s/p PCI to the Lcx (stent placed 07/04/24) patient was on Brilinta /aspirin  as outpatient with plan to continue dual antiplatelet therapy for at least 6 months but preferably 1 year with aspirin  and Brilinta  followed by Brilinta  monotherapy indefinitely.  Lipitor currently on hold.  Continue metoprolol .  Cardiology on board and dual platelets on hold at this time.  HFpEF  review of 2D echocardiogram from  07/03/2024 with EF of 60 to 65%, grade 1 diastolic dysfunction noted.  Diuretics on hold.   Diabetes mellitus type 2. Recent hemoglobin A1C is 5.7 reflecting excellent diabetic control PTA.  Continue to hold metformin  and Farxiga .  Continue sliding scale insulin , Accu-Cheks.  Currently NPO.  POC glucose of 95  Mild hypokalemia.  Improved after repletion.  Latest potassium of 3.7.   Essential hypertension-patient is on Procardia  and metoprolol  at home.  Currently being monitored.  Acute kidney injury. Creatinine of 1.2 from baseline around 1.1.  Check BMP in AM.   Mild anemia leukopenia will continue to monitor.   Class I obesity.  Body mass index is 34.58 kg/m. Might benefit from lifestyle modification as outpatient   Goals of care.  Palliative care on board.  DNR.  DVT prophylaxis: heparin  injection 5,000 Units Start: 08/16/24 2200 SCDs Start: 08/16/24 1848 Place TED hose Start: 08/16/24 1848   Disposition: Likely home when improved.  Status is: Inpatient Remains inpatient appropriate because: Pending clinical improvement,    Code Status:     Code Status: Limited: Do not attempt resuscitation (  DNR) -DNR-LIMITED -Do Not Intubate/DNI   Family Communication: Spoke with the patient's friend at bedside on 08/17/2024  Consultants: General  Surgery Cardiology Palliative care  Procedures: NG tube placement  Anti-infectives:  Zosyn  IV  Anti-infectives (From admission, onward)    Start     Dose/Rate Route Frequency Ordered Stop   08/17/24 0000  piperacillin -tazobactam (ZOSYN ) IVPB 3.375 g        3.375 g 12.5 mL/hr over 240 Minutes Intravenous Every 8 hours 08/16/24 1844     08/16/24 1545  piperacillin -tazobactam (ZOSYN ) IVPB 3.375 g        3.375 g 100 mL/hr over 30 Minutes Intravenous  Once 08/16/24 1543 08/16/24 1800       Subjective: Today, patient was seen and examined at bedside.  Patient denies any nausea vomiting abdominal pain.  Has had flatus but has not had bowel movement.  Feels hungry.  Some NG tube output noted.  Objective: Vitals:   08/20/24 0515 08/20/24 0945  BP: 125/75 118/69  Pulse: 90 87  Resp:  18  Temp:  98 F (36.7 C)  SpO2: (!) 89% 91%    Intake/Output Summary (Last 24 hours) at 08/20/2024 1421 Last data filed at 08/20/2024 1200 Gross per 24 hour  Intake 3032.93 ml  Output 2800 ml  Net 232.93 ml   Filed Weights   08/16/24 1258  Weight: 115.7 kg   Body mass index is 34.58 kg/m.   Physical Exam:  General: Obese built, not in obvious distress, NG tube in place, HENT:   No scleral pallor or icterus noted. Oral mucosa is moist.  Chest:  Clear breath sounds.. No crackles or wheezes.  CVS: S1 &S2 heard. No murmur.  Regular rate and rhythm. Abdomen: Soft, nontender, mild distention bowel sounds are heard.   Extremities: No cyanosis, clubbing or edema.  Peripheral pulses are palpable. Psych: Alert, awake and oriented, normal mood CNS:  No cranial nerve deficits.  Power equal in all extremities.   Skin: Warm and dry.  No rashes noted.  Data Review: I have personally reviewed the following laboratory data and studies,  CBC: Recent Labs  Lab 08/16/24 1325 08/17/24 0208 08/18/24 0435 08/18/24 2358  WBC 3.0* 2.2* 5.3 5.8  NEUTROABS 1.9  --   --   --   HGB 11.0* 10.8* 9.0*  9.4*  HCT 34.0* 32.5* 28.2* 29.9*  MCV 95.2 91.5 96.6 98.0  PLT 178 170 169 180   Basic Metabolic Panel: Recent Labs  Lab 08/16/24 1325 08/17/24 0208 08/18/24 0435 08/18/24 2358  NA 136 138 138 138  K 3.6 3.3* 3.1* 3.7  CL 98 99 100 100  CO2 22 28 29 27   GLUCOSE 147* 125* 80 91  BUN 20 23 24* 20  CREATININE 1.17 1.67* 1.48* 1.29*  CALCIUM  9.1 8.3* 8.0* 8.2*  MG  --   --  1.8 2.4  PHOS  --   --   --  2.8   Liver Function Tests: Recent Labs  Lab 08/16/24 1325 08/17/24 0208  AST 100* 107*  ALT 75* 87*  ALKPHOS 141* 122  BILITOT 0.9 1.2  PROT 6.2* 5.3*  ALBUMIN  3.3* 2.2*   Recent Labs  Lab 08/16/24 1325  LIPASE 20   No results for input(s): AMMONIA in the last 168 hours. Cardiac Enzymes: No results for input(s): CKTOTAL, CKMB, CKMBINDEX, TROPONINI in the last 168 hours. BNP (last 3 results) Recent Labs    05/15/24 1758  BNP 115.0*    ProBNP (last 3 results)  Recent Labs    07/03/24 0023  PROBNP 218.0    CBG: Recent Labs  Lab 08/19/24 1206 08/19/24 1708 08/19/24 2105 08/20/24 0759 08/20/24 1254  GLUCAP 88 138* 145* 112* 121*   No results found for this or any previous visit (from the past 240 hours).   Studies: DG Abd Portable 1V Result Date: 08/19/2024 CLINICAL DATA:  Bowel obstruction. EXAM: PORTABLE ABDOMEN - 1 VIEW COMPARISON:  08/18/2024 FINDINGS: NG tube tip is in the proximal stomach. Although side port of the NG tube is not clearly visualized, it is likely in the distal esophagus. Gaseous distention of transverse colon noted without frank dilatation. Gas is visible in the nondistended rectum. Air is seen scattered through nondilated small bowel loops. IMPRESSION: 1. NG tube tip is in the proximal stomach. Although side port of the NG tube is not clearly visualized, it is likely in the distal esophagus. 2. Nonspecific bowel gas pattern. Electronically Signed   By: Camellia Candle M.D.   On: 08/19/2024 07:21   DG Abd 1 View Result  Date: 08/18/2024 CLINICAL DATA:  Enteric catheter placement EXAM: ABDOMEN - 1 VIEW COMPARISON:  08/17/2024 FINDINGS: Frontal view of the lower chest and upper abdomen demonstrates stable position of the enteric catheter, tip projecting over the gastric fundus. Side port projects proximally 2.5 cm above the left hemidiaphragm. Bowel gas pattern is unremarkable. Stable left pleural effusion and left lower lobe consolidation. IMPRESSION: 1. Enteric catheter tip projecting over the gastric fundus, with side port projecting 2.5 cm above the left hemidiaphragm. 2. Stable left pleural effusion and left basilar consolidation. Electronically Signed   By: Ozell Daring M.D.   On: 08/18/2024 17:10      Vernal Alstrom, MD  [facility] 08/20/2024  If 7PM-7AM, please contact night-coverage

## 2024-08-21 DIAGNOSIS — I2510 Atherosclerotic heart disease of native coronary artery without angina pectoris: Secondary | ICD-10-CM | POA: Diagnosis not present

## 2024-08-21 DIAGNOSIS — K559 Vascular disorder of intestine, unspecified: Secondary | ICD-10-CM | POA: Diagnosis not present

## 2024-08-21 LAB — CBC
HCT: 28.8 % — ABNORMAL LOW (ref 39.0–52.0)
Hemoglobin: 9.1 g/dL — ABNORMAL LOW (ref 13.0–17.0)
MCH: 31 pg (ref 26.0–34.0)
MCHC: 31.6 g/dL (ref 30.0–36.0)
MCV: 98 fL (ref 80.0–100.0)
Platelets: 181 K/uL (ref 150–400)
RBC: 2.94 MIL/uL — ABNORMAL LOW (ref 4.22–5.81)
RDW: 18.9 % — ABNORMAL HIGH (ref 11.5–15.5)
WBC: 8.2 K/uL (ref 4.0–10.5)
nRBC: 0.8 % — ABNORMAL HIGH (ref 0.0–0.2)

## 2024-08-21 LAB — BASIC METABOLIC PANEL WITH GFR
Anion gap: 7 (ref 5–15)
BUN: 10 mg/dL (ref 8–23)
CO2: 27 mmol/L (ref 22–32)
Calcium: 7.8 mg/dL — ABNORMAL LOW (ref 8.9–10.3)
Chloride: 100 mmol/L (ref 98–111)
Creatinine, Ser: 0.94 mg/dL (ref 0.61–1.24)
GFR, Estimated: >60 mL/min (ref >60)
Glucose, Bld: 114 mg/dL — ABNORMAL HIGH (ref 70–99)
Potassium: 3 mmol/L — ABNORMAL LOW (ref 3.5–5.1)
Sodium: 134 mmol/L — ABNORMAL LOW (ref 135–145)

## 2024-08-21 LAB — GLUCOSE, CAPILLARY
Glucose-Capillary: 152 mg/dL — ABNORMAL HIGH (ref 70–99)
Glucose-Capillary: 116 mg/dL — ABNORMAL HIGH (ref 70–99)
Glucose-Capillary: 106 mg/dL — ABNORMAL HIGH (ref 70–99)
Glucose-Capillary: 93 mg/dL (ref 70–99)

## 2024-08-21 LAB — MAGNESIUM: Magnesium: 1.5 mg/dL — ABNORMAL LOW (ref 1.7–2.4)

## 2024-08-21 LAB — PHOSPHORUS: Phosphorus: 2.5 mg/dL (ref 2.5–4.6)

## 2024-08-21 MED ORDER — TICAGRELOR 90 MG PO TABS
90.0000 mg | ORAL_TABLET | Freq: Two times a day (BID) | ORAL | Status: DC
  Administered 2024-08-21 – 2024-08-23 (×5): 90 mg via ORAL
  Filled 2024-08-21 (×6): qty 1

## 2024-08-21 MED ORDER — POTASSIUM CHLORIDE 10 MEQ/100ML IV SOLN
10.0000 meq | INTRAVENOUS | Status: AC
  Administered 2024-08-21 (×4): 10 meq via INTRAVENOUS
  Filled 2024-08-21 (×4): qty 100

## 2024-08-21 MED ORDER — MAGNESIUM SULFATE 2 GM/50ML IV SOLN
2.0000 g | Freq: Once | INTRAVENOUS | Status: AC
  Administered 2024-08-21: 2 g via INTRAVENOUS
  Filled 2024-08-21: qty 50

## 2024-08-21 MED ORDER — SODIUM CHLORIDE 0.9 % IV SOLN: INTRAVENOUS | Status: DC

## 2024-08-21 NOTE — Plan of Care (Signed)
 Problem: Education: Goal: Knowledge of General Education information will improve Description: Including pain rating scale, medication(s)/side effects and non-pharmacologic comfort measures 08/21/2024 1634 by Marvin Jeoffrey CROME, RN Outcome: Progressing 08/21/2024 1634 by Marvin Jeoffrey CROME, RN Outcome: Progressing   Problem: Health Behavior/Discharge Planning: Goal: Ability to manage health-related needs will improve 08/21/2024 1634 by Marvin Jeoffrey CROME, RN Outcome: Progressing 08/21/2024 1634 by Marvin Jeoffrey CROME, RN Outcome: Progressing   Problem: Clinical Measurements: Goal: Ability to maintain clinical measurements within normal limits will improve 08/21/2024 1634 by Marvin Jeoffrey CROME, RN Outcome: Progressing 08/21/2024 1634 by Marvin Jeoffrey CROME, RN Outcome: Progressing Goal: Will remain free from infection 08/21/2024 1634 by Marvin Jeoffrey CROME, RN Outcome: Progressing 08/21/2024 1634 by Marvin Jeoffrey CROME, RN Outcome: Progressing Goal: Diagnostic test results will improve 08/21/2024 1634 by Marvin Jeoffrey CROME, RN Outcome: Progressing 08/21/2024 1634 by Marvin Jeoffrey CROME, RN Outcome: Progressing Goal: Respiratory complications will improve 08/21/2024 1634 by Marvin Jeoffrey CROME, RN Outcome: Progressing 08/21/2024 1634 by Marvin Jeoffrey CROME, RN Outcome: Progressing Goal: Cardiovascular complication will be avoided 08/21/2024 1634 by Marvin Jeoffrey CROME, RN Outcome: Progressing 08/21/2024 1634 by Marvin Jeoffrey CROME, RN Outcome: Progressing   Problem: Activity: Goal: Risk for activity intolerance will decrease 08/21/2024 1634 by Marvin Jeoffrey CROME, RN Outcome: Progressing 08/21/2024 1634 by Marvin Jeoffrey CROME, RN Outcome: Progressing   Problem: Nutrition: Goal: Adequate nutrition will be maintained 08/21/2024 1634 by Marvin Jeoffrey CROME, RN Outcome: Progressing 08/21/2024 1634 by Marvin Jeoffrey CROME, RN Outcome: Progressing   Problem: Coping: Goal: Level of anxiety will decrease 08/21/2024 1634 by Marvin Jeoffrey CROME, RN Outcome: Progressing 08/21/2024 1634 by Marvin Jeoffrey CROME, RN Outcome: Progressing   Problem: Elimination: Goal: Will not experience complications related to bowel motility 08/21/2024 1634 by Marvin Jeoffrey CROME, RN Outcome: Progressing 08/21/2024 1634 by Marvin Jeoffrey CROME, RN Outcome: Progressing Goal: Will not experience complications related to urinary retention 08/21/2024 1634 by Marvin Jeoffrey CROME, RN Outcome: Progressing 08/21/2024 1634 by Marvin Jeoffrey CROME, RN Outcome: Progressing   Problem: Pain Managment: Goal: General experience of comfort will improve and/or be controlled 08/21/2024 1634 by Marvin Jeoffrey CROME, RN Outcome: Progressing 08/21/2024 1634 by Marvin Jeoffrey CROME, RN Outcome: Progressing   Problem: Safety: Goal: Ability to remain free from injury will improve 08/21/2024 1634 by Marvin Jeoffrey CROME, RN Outcome: Progressing 08/21/2024 1634 by Marvin Jeoffrey CROME, RN Outcome: Progressing   Problem: Skin Integrity: Goal: Risk for impaired skin integrity will decrease 08/21/2024 1634 by Marvin Jeoffrey CROME, RN Outcome: Progressing 08/21/2024 1634 by Marvin Jeoffrey CROME, RN Outcome: Progressing   Problem: Education: Goal: Ability to describe self-care measures that may prevent or decrease complications (Diabetes Survival Skills Education) will improve 08/21/2024 1634 by Marvin Jeoffrey CROME, RN Outcome: Progressing 08/21/2024 1634 by Marvin Jeoffrey CROME, RN Outcome: Progressing Goal: Individualized Educational Video(s) 08/21/2024 1634 by Marvin Jeoffrey CROME, RN Outcome: Progressing 08/21/2024 1634 by Marvin Jeoffrey CROME, RN Outcome: Progressing   Problem: Coping: Goal: Ability to adjust to condition or change in health will improve 08/21/2024 1634 by Marvin Jeoffrey CROME, RN Outcome: Progressing 08/21/2024 1634 by Marvin Jeoffrey CROME, RN Outcome: Progressing   Problem: Fluid Volume: Goal: Ability to maintain a balanced intake and output will improve 08/21/2024 1634 by Marvin Jeoffrey CROME,  RN Outcome: Progressing 08/21/2024 1634 by Marvin Jeoffrey CROME, RN Outcome: Progressing   Problem: Health Behavior/Discharge Planning: Goal: Ability to identify and utilize available resources and services will improve 08/21/2024 1634 by Marvin Jeoffrey CROME, RN Outcome: Progressing 08/21/2024 1634 by  Vahe Pienta L, RN Outcome: Progressing Goal: Ability to manage health-related needs will improve 08/21/2024 1634 by Marvin Jeoffrey CROME, RN Outcome: Progressing 08/21/2024 1634 by Marvin Jeoffrey CROME, RN Outcome: Progressing   Problem: Metabolic: Goal: Ability to maintain appropriate glucose levels will improve 08/21/2024 1634 by Marvin Jeoffrey CROME, RN Outcome: Progressing 08/21/2024 1634 by Marvin Jeoffrey CROME, RN Outcome: Progressing   Problem: Nutritional: Goal: Maintenance of adequate nutrition will improve 08/21/2024 1634 by Marvin Jeoffrey CROME, RN Outcome: Progressing 08/21/2024 1634 by Marvin Jeoffrey CROME, RN Outcome: Progressing Goal: Progress toward achieving an optimal weight will improve 08/21/2024 1634 by Marvin Jeoffrey CROME, RN Outcome: Progressing 08/21/2024 1634 by Marvin Jeoffrey CROME, RN Outcome: Progressing   Problem: Skin Integrity: Goal: Risk for impaired skin integrity will decrease 08/21/2024 1634 by Marvin Jeoffrey CROME, RN Outcome: Progressing 08/21/2024 1634 by Marvin Jeoffrey CROME, RN Outcome: Progressing   Problem: Tissue Perfusion: Goal: Adequacy of tissue perfusion will improve 08/21/2024 1634 by Marvin Jeoffrey CROME, RN Outcome: Progressing 08/21/2024 1634 by Marvin Jeoffrey CROME, RN Outcome: Progressing

## 2024-08-21 NOTE — Progress Notes (Signed)
 PROGRESS NOTE  Marvin Carr  DOB: 30-Sep-1955  PCP: [name], MD FMW:[id number]  DOA: 08/16/2024  LOS: 5 days  Hospital Day: 6  Subjective: Patient was seen and examined this morning. Pleasant elderly Caucasian male. Sitting up in recliner.  Not in distress. Feeling better.  Family not at bedside. Yesterday, NGT was clamped, tolerated clear liquid diet.  Had a BM yesterday, continues to have flatulence  In the last 24 hours, afebrile, hemodynamically stable, breathing on room air Labs this morning with WC count 8, hemoglobin 9.1, sodium 134, potassium 3, magnesium  low at 1.5, blood glucose 152  Brief narrative: Marvin Carr is a 68 y.o. 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   -Low-density lesions consistent with metastatic disease with irregular omental densities concerning for peritoneal carcinomatosis.   Admitted to [facility] General Surgery was consulted  Assessment and plan: Ischemia of small bowel with pneumatosis  small bowel obstruction Presented with abdominal pain/cramping in the setting of peritoneal carcinomatosis, chemo induced ileus General Surgery was consulted Started on conservative management with IV fluid, NG decompression, IV antibiotic, IV analgesics, IV PPI Gradually improved. In the last 24 hours, tolerated NG tube clamping trial, clear liquid diet, had a BM General Surgery following. Continue IV Zosyn  Pain regimen --- PRN: Tylenol , oxycodone , Dilaudid , Flexeril    Metastatic  cholangiocarcinoma  Peritoneal carcinomatosis and metastatic disease to the liver and bile duct.  Diagnosed in 2022. Patient was initially initially treated with cisplatin , gemcitabine , and durvalumab  for 6 months.  Despite that, patient had disease progression in 2025, treatment delayed by hip replacement in May 2025.  06/2024, patient was transitioned to FOLFOX High risk for any surgical intervention.   Oncology following the patient.  AKI Creatinine peaked at 1.67 on 12/7 Improved now.  Recent Labs    07/07/24 0520 07/15/24 0839 07/16/24 2017 07/17/24 0637 08/05/24 0749 08/12/24 1223 08/16/24 1325 08/17/24 0208 08/18/24 0435 08/18/24 2358 08/21/24 0453  BUN 15 19 27*  --  15 19 20 23  24* 20 10  CREATININE 0.96 0.91 1.12 1.13 0.98 1.17 1.17 1.67* 1.48* 1.29* 0.94  CO2 22 28 21*  --  26 24 22 28 29 27 27    Hypokalemia/hypomagnesemia Labs this morning with potassium low at 3, magnesium  low at 1.5. Replacements ordered Recent Labs  Lab 08/16/24 1325 08/17/24 0208 08/18/24 0435 08/18/24 2358 08/21/24 0453  K 3.6 3.3* 3.1* 3.7 3.0*  MG  --   --  1.8 2.4 1.5*  PHOS  --   --   --  2.8 2.5    H/o CAD s/p Lcx stent 10/25 HLD PTA meds-had a plan to continue DAPT with Brilinta /aspirin  for at least 6 months but preferably 1 year to be followed by Brilinta  monotherapy indefinitely.  Was also on metoprolol , Lipitor. Given heme positive NG aspirate, DAPT were held Cardiology was consulted  Not having any angina.  EKG remains normal.. Extremely poor prognosis. 12/11, cardiology recommended to resume Brilinta  and keep aspirin  on hold   Chronic diastolic CHF HTN Echo from 06/2024 with EF of 60 to 65%, grade  1 diastolic dysfunction noted.   Currently on metoprolol  tartrate 12.5 mg twice daily, Procardia  30 mg daily It seems patient is to be taking Bumex  or Lasix  at home, unclear.  Currently diuretics on hold   Type 2 diabetes mellitus A1c 5.7 on 06/2024 PTA meds-metformin   500 mg daily, Farxiga  10 mg daily.  Currently both on hold Continue SSI/Accu-Cheks Recent Labs  Lab 08/20/24 1254 08/20/24 1719 08/20/24 2134 08/21/24 0806 08/21/24 1218  GLUCAP 121* 110* 139* 152* 116*   Mild anemia  Hemoglobin currently stable between 9 and 10.   Continue to monitor Recent Labs    04/23/24 1245 04/23/24 1255 06/24/24 0750 07/01/24 0805 08/17/24 0208 08/18/24 0435 08/18/24 2358 08/20/24 1903 08/21/24 0453  HGB  --    < > 10.5*   < > 10.8* 9.0* 9.4* 10.0* 9.1*  MCV  --    < > 93.8   < > 91.5 96.6 98.0 96.4 98.0  FERRITIN 84  --  590*  --   --   --   --   --   --   TIBC 375  --  301  --   --   --   --   --   --   IRON 24*  --  55  --   --   --   --   --   --    < > = values in this interval not displayed.   Anxiety/depression Continue Wellbutrin  300 mg daily, Ambien  5 mg nightly as needed  BPH Flomax   Obesity 1 Body mass index is 34.58 kg/m. Patient has been advised to make an attempt to improve diet and exercise patterns to aid in weight loss.  Nutrition Status:         Mobility: Able to ambulate independently.  Encourage ambulation  Goals of care   Code Status: Limited: Do not attempt resuscitation (DNR) -DNR-LIMITED -Do Not Intubate/DNI   Last seen by palliative care on 12/9   DVT prophylaxis:  heparin  injection 5,000 Units Start: 08/16/24 2200 SCDs Start: 08/16/24 1848 Place TED hose Start: 08/16/24 1848   Antimicrobials: IV Zosyn  Fluid: NS at 75 mL/h Consultants: General Surgery Family Communication: None at bedside  Status: Inpatient Level of care:  Telemetry   Patient is from: Home Needs to continue in-hospital care: SBO improving Anticipated d/c to: Hopefully home in 1 to 2 days      Diet:  Diet Order             Diet clear liquid Room service appropriate? Yes; Fluid consistency: Thin  Diet effective now                   Scheduled Meds:  buPROPion   300 mg Oral Daily   Chlorhexidine  Gluconate Cloth  6  each Topical Daily   heparin   5,000 Units Subcutaneous Q8H   insulin  aspart  0-4 Units Subcutaneous TID WC   insulin  aspart  0-5 Units Subcutaneous QHS   loratadine   10 mg Oral Daily   metoprolol  tartrate  12.5 mg Oral BID   NIFEdipine   30 mg Oral Daily   pantoprazole  (PROTONIX ) IV  40 mg Intravenous Q12H   sodium chloride  flush  3 mL Intravenous Q12H   sodium chloride  flush  3 mL Intravenous Q12H   tamsulosin   0.4 mg Oral Daily   ticagrelor   90 mg Oral BID    PRN meds: acetaminophen  **OR** acetaminophen , benzonatate , bisacodyl , chlorproMAZINE  (THORAZINE ) 12.5 mg in sodium chloride  0.9 %  25 mL IVPB, cyclobenzaprine , diphenoxylate -atropine , hydrALAZINE , HYDROmorphone  (DILAUDID ) injection, metoCLOPramide  (REGLAN ) injection, nitroGLYCERIN , ondansetron  **OR** ondansetron  (ZOFRAN ) IV, oxyCODONE , phenol, polyethylene glycol, sodium chloride  flush, sodium chloride  flush, zolpidem    Infusions:   sodium chloride      chlorproMAZINE  (THORAZINE ) 12.5 mg in sodium chloride  0.9 % 25 mL IVPB     magnesium  sulfate bolus IVPB 2 g (08/21/24 1253)   piperacillin -tazobactam (ZOSYN )  IV 3.375 g (08/21/24 0900)   potassium chloride  10 mEq (08/21/24 1259)    Antimicrobials: Anti-infectives (From admission, onward)    Start     Dose/Rate Route Frequency Ordered Stop   08/17/24 0000  piperacillin -tazobactam (ZOSYN ) IVPB 3.375 g        3.375 g 12.5 mL/hr over 240 Minutes Intravenous Every 8 hours 08/16/24 1844     08/16/24 1545  piperacillin -tazobactam (ZOSYN ) IVPB 3.375 g        3.375 g 100 mL/hr over 30 Minutes Intravenous  Once 08/16/24 1543 08/16/24 1800       Objective: Vitals:   08/21/24 0435 08/21/24 0951  BP: 112/72 122/81  Pulse: 88 88  Resp:  18  Temp: 98.5 F (36.9 C) 98.2 F (36.8 C)  SpO2: 93% 95%    Intake/Output Summary (Last 24 hours) at 08/21/2024 1311 Last data filed at 08/21/2024 0900 Gross per 24 hour  Intake 1078 ml  Output 100 ml  Net 978 ml   Filed Weights    08/16/24 1258  Weight: 115.7 kg   Weight change:  Body mass index is 34.58 kg/m.   Physical Exam: General exam: Pleasant, elderly Caucasian male. Skin: No rashes, lesions or ulcers. HEENT: Atraumatic, normocephalic, no obvious bleeding Lungs: Clear to auscultation bilaterally,  CVS: S1, S2, no murmur,   GI/Abd: Soft, nontender, nondistended, bowel sound present, NG tube clamped CNS: Alert, awake, oriented x 3 Psychiatry: Mood appropriate Extremities: No pedal edema, no calf tenderness,   Data Review: I have personally reviewed the laboratory data and studies available.  F/u labs ordered Unresulted Labs (From admission, onward)     Start     Ordered   08/22/24 0500  Basic metabolic panel with GFR  Tomorrow morning,   R       Question:  Specimen collection method  Answer:  IV Team=IV Team collect   08/21/24 1011   08/22/24 0500  Magnesium   Tomorrow morning,   R       Question:  Specimen collection method  Answer:  IV Team=IV Team collect   08/21/24 1011   08/22/24 0500  Phosphorus  Tomorrow morning,   R       Question:  Specimen collection method  Answer:  IV Team=IV Team collect   08/21/24 1011   08/21/24 0500  CBC  Daily,   R     Question:  Specimen collection method  Answer:  IV Team=IV Team collect   08/20/24 9089            Signed, Chapman Rota, MD [facility] 08/21/2024

## 2024-08-21 NOTE — Plan of Care (Signed)

## 2024-08-21 NOTE — Progress Notes (Signed)
 Cardiology Progress Note  Patient ID: Marvin Carr MRN: [id number] DOB: 21-Jan-1956 Date of Encounter: 08/21/2024 Primary Cardiologist: Jayson Sierras, MD  Subjective   Chief Complaint: None.   HPI: Reports BM yesterday. HGB stable. No CP.   ROS:  All other ROS reviewed and negative. Pertinent positives noted in the HPI.      Physical Exam   Vitals:   08/20/24 0945 08/20/24 1751 08/20/24 2139 08/21/24 0435  BP: 118/69 115/80 112/72 112/72  Pulse: 87 89 91 88  Resp: 18 18    Temp: 98 F (36.7 C) 97.7 F (36.5 C) 98.2 F (36.8 C) 98.5 F (36.9 C)  TempSrc: Oral Oral Oral Oral  SpO2: 91% 93% 94% 93%  Weight:      Height:        Intake/Output Summary (Last 24 hours) at 08/21/2024 0833 Last data filed at 08/21/2024 0816 Gross per 24 hour  Intake 708 ml  Output 1400 ml  Net -692 ml       08/16/2024   12:58 PM 08/05/2024    8:11 AM 08/04/2024    9:41 AM  Last 3 Weights  Weight (lbs) 255 lb 261 lb 9.6 oz 263 lb 9.6 oz  Weight (kg) 115.667 kg 118.661 kg 119.568 kg    Body mass index is 34.58 kg/m.  General: Well nourished, well developed, in no acute distress Head: Atraumatic, normal size  Eyes: PEERLA, EOMI  Neck: Supple, no JVD Endocrine: No thryomegaly Cardiac: Normal S1, S2; RRR; no murmurs, rubs, or gallops Lungs: Clear to auscultation bilaterally, no wheezing, rhonchi or rales  Abd: soft Ext: No edema, pulses 2+ Musculoskeletal: No deformities, BUE and BLE strength normal and equal Skin: Warm and dry, no rashes   Neuro: Alert and oriented to person, place, time, and situation, CNII-XII grossly intact, no focal deficits  Psych: Normal mood and affect   Cardiac Studies  LHC 07/04/2024   Mid Cx to Dist Cx lesion is 80% stenosed.   2nd Diag lesion is 95% stenosed.   A stent was successfully placed.   Post intervention, there is a 0% residual stenosis.   1.  High-grade distal left circumflex lesion treated with one 3.5 x 16 mm Synergy XD stent  postdilated to 3.75 mm. 2.  LVEDP of 20 mmHg.   Recommendation: Dual antiplatelet therapy for at least 6 months but preferably 1 year with aspirin  and Brilinta  followed by Brilinta  monotherapy indefinitely.  Patient Profile  Marvin Carr is a 68 y.o. male with metastatic cholangiocarcinoma, CAD status post PCI to left circumflex on 07/04/2024, HFpEF, diabetes, hypertension admitted on 08/16/2024 with small bowel obstruction and concerns for ischemic bowel.  Cardiology consulted for antiplatelet management.  Assessment & Plan   # Small bowel obstruction # Ischemic bowel # Metastatic cholangiocarcinoma # Acute blood loss anemia - He reports he is passed a bowel movement.  Suspect his obstruction is resolving.  Hemoglobin values are stable. - Further management per hospital team. - Antiplatelet management below.  # CAD status post PCI to mid circumflex on 07/04/2024 - Bowel obstruction is resolving.  Suspect he will not need surgery or procedures.  Hemoglobin values are stable.  Will go back on Brilinta  monotherapy 90 mg twice daily.  Hold aspirin .  Given the situation and high bleeding risk I think Brilinta  monotherapy should suffice. - I will follow back up tomorrow to make sure he is doing well.  Would make sure to have a CBC on for tomorrow.  # HFpEF # Diabetes #  Hypertension - Home meds are you are able.     For questions or updates, please contact [facility] Please consult [email or web address] for contact info under        Signed, Darryle T. Barbaraann, MD, [facility]  08/21/2024 8:33 AM

## 2024-08-21 NOTE — Progress Notes (Signed)
 Progress Note     Subjective: Patient had a bowel movement yesterday afternoon. Continuing to have flatulence. Patient reports tolerating CLD yesterday while NGT was clamped. He did not have n/v yesterday or overnight. NGT was reconnected to suction around 3PM yesterday. Output since then has been around 100 mL. Patient denies pain.   ROS   Objective: Vital signs in last 24 hours: Temp:  [97.7 F (36.5 C)-98.5 F (36.9 C)] 98.5 F (36.9 C) (12/11 0435) Pulse Rate:  [87-91] 88 (12/11 0435) Resp:  [18] 18 (12/10 1751) BP: (112-118)/(69-80) 112/72 (12/11 0435) SpO2:  [91 %-94 %] 93 % (12/11 0435) Last BM Date : 08/16/24  Intake/Output from previous day: 12/10 0701 - 12/11 0700 In: 1536 [P.O.:1536] Out: 1300 [Urine:200; Emesis/NG output:1100] Intake/Output this shift: Total I/O In: -  Out: 100 [Emesis/NG output:100]  PE: General: Pleasant male who is laying in bed in NAD. HEENT: Head is normocephalic, atraumatic. Sclera are noninjected. NGT in place.  Heart: HR normal during encounter.  Lungs: Respiratory effort nonlabored. Abd: Soft with distention. NT to palpation. No rebound tenderness or guarding.  MS: Able to move all 4 extremities.  Skin: Warm and dry.  Psych: A&Ox3 with an appropriate affect.   Lab Results:  Recent Labs    08/20/24 1903 08/21/24 0453  WBC 7.4 8.2  HGB 10.0* 9.1*  HCT 31.9* 28.8*  PLT 197 181   BMET Recent Labs    08/18/24 2358 08/21/24 0453  NA 138 134*  K 3.7 3.0*  CL 100 100  CO2 27 27  GLUCOSE 91 114*  BUN 20 10  CREATININE 1.29* 0.94  CALCIUM  8.2* 7.8*   PT/INR No results for input(s): LABPROT, INR in the last 72 hours. CMP     Component Value Date/Time   NA 134 (L) 08/21/2024 0453   K 3.0 (L) 08/21/2024 0453   CL 100 08/21/2024 0453   CO2 27 08/21/2024 0453   GLUCOSE 114 (H) 08/21/2024 0453   BUN 10 08/21/2024 0453   CREATININE 0.94 08/21/2024 0453   CALCIUM  7.8 (L) 08/21/2024 0453   PROT 5.3 (L) 08/17/2024  0208   ALBUMIN  2.2 (L) 08/17/2024 0208   AST 107 (H) 08/17/2024 0208   ALT 87 (H) 08/17/2024 0208   ALKPHOS 122 08/17/2024 0208   BILITOT 1.2 08/17/2024 0208   GFRNONAA >60 08/21/2024 0453   GFRAA >90 06/11/2014 0558   Lipase     Component Value Date/Time   LIPASE 20 08/16/2024 1325       Studies/Results: No results found.  Anti-infectives: Anti-infectives (From admission, onward)    Start     Dose/Rate Route Frequency Ordered Stop   08/17/24 0000  piperacillin -tazobactam (ZOSYN ) IVPB 3.375 g        3.375 g 12.5 mL/hr over 240 Minutes Intravenous Every 8 hours 08/16/24 1844     08/16/24 1545  piperacillin -tazobactam (ZOSYN ) IVPB 3.375 g        3.375 g 100 mL/hr over 30 Minutes Intravenous  Once 08/16/24 1543 08/16/24 1800        Assessment/Plan 68 y.o. male with metastatic cholangiocarcinoma with peritoneal carcinomatosis who presented with abdominal pain, found to have bowel obstruction with pneumatosis on CT  -Xray from 10/9 showed gaseous distention of the transverse colon noted without frank dilatation. Gas visible in the nondistended rectum. Air is seen scattered through nondilated small bowel loops.  -Afebrile. -Denies pain. Had a bowel movement yesterday afternoon 12/10. Having flatulence. Denies n/v. Tolerated CLD while NGT was  clamped on 12/10. -Per nursing staff, NGT output since NGT was placed back to suction (around 3PM) only 100 mL.  -Will proceed with clamping NGT again today. Since patient has had a BM, will discuss with attending pending how patient responds about removal vs leaving NGT in place. -Ongoing care with cardiology, palliative and oncology. -Given his metastatic disease, intra-abdominal carcinomatosis, and ascites, he is not a candidate for an open G-tube. Hopeful that he is improving given that he has had a BM. If he does not improve, consideration could be made to get IR's opinion regarding a perc g-tube. -Will continue to follow.    FEN:  NGT clamp trial; CLD; IVF per primary team VTE: Heparin  injection; ticagrelor  ID:Zosyn     LOS: 5 days   I reviewed specialist notes, hospitalist notes, consulting provider notes, nursing notes, last 24 h vitals and pain scores, last 48 h intake and output, last 24 h labs and trends, and last 24 h imaging results.  This care required moderate level of medical decision making.    Marjorie Carlyon Favre, [facility] Surgery 08/21/2024, 8:34 AM Please see Amion for pager number during day hours 7:00am-4:30pm

## 2024-08-22 DIAGNOSIS — I5030 Unspecified diastolic (congestive) heart failure: Secondary | ICD-10-CM

## 2024-08-22 DIAGNOSIS — Z955 Presence of coronary angioplasty implant and graft: Secondary | ICD-10-CM | POA: Diagnosis not present

## 2024-08-22 DIAGNOSIS — C786 Secondary malignant neoplasm of retroperitoneum and peritoneum: Secondary | ICD-10-CM

## 2024-08-22 DIAGNOSIS — I110 Hypertensive heart disease with heart failure: Secondary | ICD-10-CM

## 2024-08-22 DIAGNOSIS — E785 Hyperlipidemia, unspecified: Secondary | ICD-10-CM

## 2024-08-22 DIAGNOSIS — K559 Vascular disorder of intestine, unspecified: Secondary | ICD-10-CM | POA: Diagnosis not present

## 2024-08-22 LAB — GLUCOSE, CAPILLARY
Glucose-Capillary: 103 mg/dL — ABNORMAL HIGH (ref 70–99)
Glucose-Capillary: 98 mg/dL (ref 70–99)
Glucose-Capillary: 103 mg/dL — ABNORMAL HIGH (ref 70–99)
Glucose-Capillary: 118 mg/dL — ABNORMAL HIGH (ref 70–99)

## 2024-08-22 LAB — CBC
HCT: 27.5 % — ABNORMAL LOW (ref 39.0–52.0)
Hemoglobin: 8.6 g/dL — ABNORMAL LOW (ref 13.0–17.0)
MCH: 30.6 pg (ref 26.0–34.0)
MCHC: 31.3 g/dL (ref 30.0–36.0)
MCV: 97.9 fL (ref 80.0–100.0)
Platelets: 180 K/uL (ref 150–400)
RBC: 2.81 MIL/uL — ABNORMAL LOW (ref 4.22–5.81)
RDW: 19.4 % — ABNORMAL HIGH (ref 11.5–15.5)
WBC: 8.1 K/uL (ref 4.0–10.5)
nRBC: 1.2 % — ABNORMAL HIGH (ref 0.0–0.2)

## 2024-08-22 LAB — BASIC METABOLIC PANEL WITH GFR
Anion gap: 11 (ref 5–15)
BUN: 7 mg/dL — ABNORMAL LOW (ref 8–23)
CO2: 22 mmol/L (ref 22–32)
Calcium: 8 mg/dL — ABNORMAL LOW (ref 8.9–10.3)
Chloride: 104 mmol/L (ref 98–111)
Creatinine, Ser: 1.03 mg/dL (ref 0.61–1.24)
GFR, Estimated: >60 mL/min (ref >60)
Glucose, Bld: 92 mg/dL (ref 70–99)
Potassium: 3 mmol/L — ABNORMAL LOW (ref 3.5–5.1)
Sodium: 137 mmol/L (ref 135–145)

## 2024-08-22 LAB — PHOSPHORUS: Phosphorus: 2.4 mg/dL — ABNORMAL LOW (ref 2.5–4.6)

## 2024-08-22 LAB — MAGNESIUM: Magnesium: 1.7 mg/dL (ref 1.7–2.4)

## 2024-08-22 MED ORDER — MAGNESIUM SULFATE 2 GM/50ML IV SOLN
2.0000 g | Freq: Once | INTRAVENOUS | Status: AC
  Administered 2024-08-22: 2 g via INTRAVENOUS
  Filled 2024-08-22: qty 50

## 2024-08-22 MED ORDER — POTASSIUM PHOSPHATES 15 MMOLE/5ML IV SOLN
15.0000 mmol | Freq: Once | INTRAVENOUS | Status: AC
  Administered 2024-08-22: 15 mmol via INTRAVENOUS
  Filled 2024-08-22: qty 5

## 2024-08-22 MED ORDER — POTASSIUM CHLORIDE 10 MEQ/100ML IV SOLN
10.0000 meq | INTRAVENOUS | Status: AC
  Administered 2024-08-22 (×4): 10 meq via INTRAVENOUS
  Filled 2024-08-22 (×4): qty 100

## 2024-08-22 NOTE — Progress Notes (Signed)
 Progress Note     Subjective: Patient reports no new concerns since removal of NGT removed yesterday. Reports no pain. Tolerating CLD without nausea and vomiting. Had another BM and having flatulence.   ROS  All negative with the exception of above.  Objective: Vital signs in last 24 hours: Temp:  [97.7 F (36.5 C)-99.9 F (37.7 C)] 98.1 F (36.7 C) (12/12 1013) Pulse Rate:  [83-93] 88 (12/12 1013) Resp:  [16] 16 (12/12 1013) BP: (104-121)/(60-77) 120/77 (12/12 1013) SpO2:  [96 %-99 %] 97 % (12/12 1013) Last BM Date : 08/20/24  Intake/Output from previous day: 12/11 0701 - 12/12 0700 In: 3306.3 [P.O.:2498; I.V.:3; IV Piggyback:805.3] Out: 375 [Urine:275; Emesis/NG output:100] Intake/Output this shift: Total I/O In: -  Out: 250 [Urine:250]  PE: General: Pleasant male who is laying in bed in NAD. HEENT: Head is normocephalic, atraumatic. Sclera are noninjected.  Heart: HR normal during encounter.  Lungs: Respiratory effort nonlabored. Abd: Soft with mild distention. NT to palpation. No rebound tenderness or guarding.  MS: Able to move all 4 extremities.  Skin: Warm and dry.  Psych: A&Ox3 with an appropriate affect.    Lab Results:  Recent Labs    08/21/24 0453 08/22/24 0022  WBC 8.2 8.1  HGB 9.1* 8.6*  HCT 28.8* 27.5*  PLT 181 180   BMET Recent Labs    08/21/24 0453 08/22/24 0022  NA 134* 137  K 3.0* 3.0*  CL 100 104  CO2 27 22  GLUCOSE 114* 92  BUN 10 7*  CREATININE 0.94 1.03  CALCIUM  7.8* 8.0*   PT/INR No results for input(s): LABPROT, INR in the last 72 hours. CMP     Component Value Date/Time   NA 137 08/22/2024 0022   K 3.0 (L) 08/22/2024 0022   CL 104 08/22/2024 0022   CO2 22 08/22/2024 0022   GLUCOSE 92 08/22/2024 0022   BUN 7 (L) 08/22/2024 0022   CREATININE 1.03 08/22/2024 0022   CALCIUM  8.0 (L) 08/22/2024 0022   PROT 5.3 (L) 08/17/2024 0208   ALBUMIN  2.2 (L) 08/17/2024 0208   AST 107 (H) 08/17/2024 0208   ALT 87 (H)  08/17/2024 0208   ALKPHOS 122 08/17/2024 0208   BILITOT 1.2 08/17/2024 0208   GFRNONAA >60 08/22/2024 0022   GFRAA >90 06/11/2014 0558   Lipase     Component Value Date/Time   LIPASE 20 08/16/2024 1325       Studies/Results: No results found.  Anti-infectives: Anti-infectives (From admission, onward)    Start     Dose/Rate Route Frequency Ordered Stop   08/17/24 0000  piperacillin -tazobactam (ZOSYN ) IVPB 3.375 g        3.375 g 12.5 mL/hr over 240 Minutes Intravenous Every 8 hours 08/16/24 1844     08/16/24 1545  piperacillin -tazobactam (ZOSYN ) IVPB 3.375 g        3.375 g 100 mL/hr over 30 Minutes Intravenous  Once 08/16/24 1543 08/16/24 1800        Assessment/Plan 68 y.o. male with metastatic cholangiocarcinoma with peritoneal carcinomatosis who presented with abdominal pain, found to have bowel obstruction with pneumatosis on CT  -Xray from 10/9 showed gaseous distention of the transverse colon noted without frank dilatation. Gas visible in the nondistended rectum. Air is seen scattered through nondilated small bowel loops.  -Afebrile. -Successful clamp trial 12/11 and NGT removed.  -Denies pain. Having bowel function/flatulence. Denies n/v. Tolerated CLD. -Will advance to FLD. Okay to advance diet as tolerated.  -Ongoing care with cardiology,  palliative and oncology. -No surgical intervention needed at this time. -Patient is stable from general surgery standpoint. Does not need follow up. We will sign off. Please call for further questions or concerns.    FEN: FLD; IVF per primary team VTE: Heparin  injection; ticagrelor  ID:Zosyn     LOS: 6 days   I reviewed nursing notes, consulting provider notes, hospitalist notes, last 24 h vitals and pain scores, last 48 h intake and output, last 24 h labs and trends, and last 24 h imaging results.  This care required moderate level of medical decision making.    Marjorie Carlyon Favre, [facility]  Surgery 08/22/2024, 10:22 AM Please see Amion for pager number during day hours 7:00am-4:30pm

## 2024-08-22 NOTE — Progress Notes (Addendum)
° °  Palliative Medicine Inpatient Follow Up Note HPI: Marvin Carr is a 68 y.o. 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 Palliative care has been requested to support additional goals of care conversations.   Today's Discussion 08/22/2024  *Please note that this is a verbal dictation therefore any spelling or grammatical errors are due to the Dragon Medical One system interpretation.  I reviewed the chart notes including nursing notes from Bernice Ades, progress notes from Dr. Arlice, Dr. Barbaraann, & Marjorie Favre - Surgery PA. I also reviewed vital signs, nursing flowsheets, medication administrations record, labs Hgb 8.6/Hct 27.5, and imaging.    Oral Intake %: Full liquids diet tolerating 100% I/O:  (-) Bowel Movements:  None (+) Flatulence Mobility: Can mobilize well with stability on feet  I met with Seung this morning. He is sitting up in the chair. The NGT has been removed and he has been able to eat and tolerate all of his clear diet. He has no complaints at this time.   Stokes is hopeful for discharge in the oncoming days   Plan for OP FU with Dr. [name]. At this time Kisean does not see the need for OP Palliative support, though he understands that is always an option if needed.   Questions and concerns addressed/Palliative Support Provided.   Objective Assessment: Vital Signs Vitals:   08/22/24 0859 08/22/24 1013  BP: 121/77 120/77  Pulse: 89 88  Resp:  16  Temp:  98.1 F (36.7 C)  SpO2:  97%    Intake/Output Summary (Last 24 hours) at 08/22/2024 1311 Last data filed at 08/22/2024 0900 Gross per 24 hour   Intake 1428.3 ml  Output 525 ml  Net 903.3 ml   Last Weight  Most recent update: 08/16/2024 12:58 PM    Weight  115.7 kg (255 lb)            Gen: Elderly Caucasian male HEENT: NGT, moist mucous membranes CV: Regular rate and rhythm  PULM: On RA nasal cannula ABD: Soft  EXT: No edema  Neuro: Alert and oriented x3   SUMMARY OF RECOMMENDATIONS   DNAR/DNI   Continue present care  Goals remain for ongoing improvements   The PMT will remain peripheral unless otherwise needed  At this time [name] politely declines OP Palliative FU, however is aware it is an option in the future if needed ______________________________________________________________________________________ Rosaline Becton [place] Palliative Medicine Team Team Cell Phone: [phone] Please utilize secure chat with additional questions, if there is no response within 30 minutes please call the above phone number  Time: 21  Palliative Medicine Team providers are available by phone from 7am to 7pm daily and can be reached through the team cell phone.  Should this patient require assistance outside of these hours, please call the patient's attending physician.

## 2024-08-22 NOTE — Progress Notes (Signed)
 PROGRESS NOTE  Marvin Carr  DOB: 08-27-1956  PCP: [name], MD FMW:[id number]  DOA: 08/16/2024  LOS: 6 days  Hospital Day: 7  Subjective: Patient was seen and examined this morning. Sitting up in recliner.  Not in distress Feels better with NG tube out Afebrile, hemodynamically stable Labs from this morning with potassium low at 3, phosphorus low at 2.4, magnesium  at 1.7, hemoglobin at 8.6, WBC count 8.1  Brief narrative: Marvin Carr is a 68 y.o. 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   -Low-density lesions consistent with metastatic disease with irregular omental densities concerning for peritoneal carcinomatosis.   Admitted to [facility] General Surgery was consulted  Assessment and plan: Ischemia of small bowel with pneumatosis  small bowel obstruction Presented with abdominal pain/cramping in the setting of peritoneal carcinomatosis, chemo induced ileus General Surgery was consulted Started on conservative management with IV fluid, NG decompression, IV antibiotic, IV analgesics, IV PPI Gradually improved. In the last 24 hours, tolerated NG tube clamping trial, clear liquid diet, had a BM. Diet advanced to full liquid by general surgery today.  Advance as tolerated. General Surgery following. Continue IV Zosyn  Pain regimen --- PRN: Tylenol , oxycodone , Dilaudid , Flexeril    Metastatic cholangiocarcinoma  Peritoneal carcinomatosis and metastatic disease to the liver and bile duct.  Diagnosed in 2022. Patient was  initially initially treated with cisplatin , gemcitabine , and durvalumab  for 6 months.  Despite that, patient had disease progression in 2025, treatment delayed by hip replacement in May 2025.  06/2024, patient was transitioned to FOLFOX High risk for any surgical intervention.   Oncology following the patient.  AKI Creatinine peaked at 1.67 on 12/7 Improved now to normal  Recent Labs    07/15/24 0839 07/16/24 2017 07/17/24 0637 08/05/24 0749 08/12/24 1223 08/16/24 1325 08/17/24 0208 08/18/24 0435 08/18/24 2358 08/21/24 0453 08/22/24 0022  BUN 19 27*  --  15 19 20 23  24* 20 10 7*  CREATININE 0.91 1.12 1.13 0.98 1.17 1.17 1.67* 1.48* 1.29* 0.94 1.03  CO2 28 21*  --  26 24 22 28 29 27 27 22    Hypokalemia/hypomagnesemia/hypophosphatemia Labs this morning showed low potassium, magnesium  and phosphorus level.   Replacements ordered. Recent Labs  Lab 08/17/24 0208 08/18/24 0435 08/18/24 2358 08/21/24 0453 08/22/24 0022  K 3.3* 3.1* 3.7 3.0* 3.0*  MG  --  1.8 2.4 1.5* 1.7  PHOS  --   --  2.8 2.5 2.4*    H/o CAD s/p Lcx stent 10/25 HLD PTA meds-had a plan to continue DAPT with Brilinta /aspirin  for at least 6 months but preferably 1 year to be followed by Brilinta  monotherapy indefinitely.  Was also on metoprolol , Lipitor. Given heme positive NG aspirate, DAPT were held Cardiology was consulted  Not having any angina.  EKG remains normal.. Extremely poor prognosis. 12/11, cardiology recommended to resume Brilinta  and keep aspirin  on hold. Given slight drop in hemoglobin, to watch hemoglobin trend for the next 24 hours    Chronic diastolic CHF HTN Echo from 06/2024 with EF of 60 to 65%, grade 1 diastolic dysfunction noted.  Currently on metoprolol  tartrate 12.5 mg twice daily, Procardia  30 mg daily It seems patient is to be taking Bumex  or Lasix  at home, unclear.  Currently diuretics on hold   Type 2 diabetes mellitus A1c 5.7 on 06/2024 PTA meds-metformin  500 mg daily,  Farxiga  10 mg daily.  Currently both on hold Continue SSI/Accu-Cheks Recent Labs  Lab 08/21/24 1724 08/21/24 2040 08/22/24 0745 08/22/24 1156 08/22/24 1646  GLUCAP 106* 93 103* 98 103*   Mild anemia  Hemoglobin slightly downtrending in last 48 hours.. Continue to monitor on Brilinta  Recent Labs    04/23/24 1245 04/23/24 1255 06/24/24 0750 07/01/24 0805 08/18/24 0435 08/18/24 2358 08/20/24 1903 08/21/24 0453 08/22/24 0022  HGB  --    < > 10.5*   < > 9.0* 9.4* 10.0* 9.1* 8.6*  MCV  --    < > 93.8   < > 96.6 98.0 96.4 98.0 97.9  FERRITIN 84  --  590*  --   --   --   --   --   --   TIBC 375  --  301  --   --   --   --   --   --   IRON 24*  --  55  --   --   --   --   --   --    < > = values in this interval not displayed.   Anxiety/depression Continue Wellbutrin  300 mg daily, Ambien  5 mg nightly as needed  BPH Flomax   Obesity 1 Body mass index is 34.58 kg/m. Patient has been advised to make an attempt to improve diet and exercise patterns to aid in weight loss.  Nutrition Status:         Mobility: Able to ambulate independently.  Encourage ambulation  Goals of care   Code Status: Limited: Do not attempt resuscitation (DNR) -DNR-LIMITED -Do Not Intubate/DNI   Last seen by palliative care on 12/9   DVT prophylaxis:  heparin  injection 5,000 Units Start: 08/16/24 2200 SCDs Start: 08/16/24 1848 Place TED hose Start: 08/16/24 1848   Antimicrobials: IV Zosyn  Fluid: Stop IV fluid today Consultants: General Surgery Family Communication: None at bedside  Status: Inpatient Level of care:  Telemetry   Patient is from: Home Needs to continue in-hospital care: SBO improving.  If hemoglobin stable, plan to discharge home tomorrow Anticipated d/c to: Hopefully home in 1 to 2 days      Diet:  Diet Order             Diet full liquid Room service appropriate? Yes; Fluid consistency: Thin  Diet effective now                   Scheduled Meds:   buPROPion   300 mg Oral Daily   Chlorhexidine  Gluconate Cloth  6 each Topical Daily   heparin   5,000 Units Subcutaneous Q8H   insulin  aspart  0-4 Units Subcutaneous TID WC   insulin  aspart  0-5 Units Subcutaneous QHS   loratadine   10 mg Oral Daily   metoprolol  tartrate  12.5 mg Oral BID   NIFEdipine   30 mg Oral Daily   pantoprazole  (PROTONIX ) IV  40 mg Intravenous Q12H   sodium chloride  flush  3 mL Intravenous Q12H   sodium chloride  flush  3 mL Intravenous Q12H   tamsulosin   0.4 mg Oral Daily   ticagrelor   90 mg Oral BID    PRN meds: acetaminophen  **OR** acetaminophen , benzonatate , bisacodyl , chlorproMAZINE  (THORAZINE ) 12.5 mg in sodium  chloride 0.9 % 25 mL IVPB, cyclobenzaprine , diphenoxylate -atropine , hydrALAZINE , HYDROmorphone  (DILAUDID ) injection, metoCLOPramide  (REGLAN ) injection, nitroGLYCERIN , ondansetron  **OR** ondansetron  (ZOFRAN ) IV, oxyCODONE , phenol, polyethylene glycol, sodium chloride  flush, sodium chloride  flush, zolpidem    Infusions:   chlorproMAZINE  (THORAZINE ) 12.5 mg in sodium chloride  0.9 % 25 mL IVPB     piperacillin -tazobactam (ZOSYN )  IV 3.375 g (08/22/24 1623)   potassium PHOSPHATE IVPB (in mmol) 15 mmol (08/22/24 1518)    Antimicrobials: Anti-infectives (From admission, onward)    Start     Dose/Rate Route Frequency Ordered Stop   08/17/24 0000  piperacillin -tazobactam (ZOSYN ) IVPB 3.375 g        3.375 g 12.5 mL/hr over 240 Minutes Intravenous Every 8 hours 08/16/24 1844     08/16/24 1545  piperacillin -tazobactam (ZOSYN ) IVPB 3.375 g        3.375 g 100 mL/hr over 30 Minutes Intravenous  Once 08/16/24 1543 08/16/24 1800       Objective: Vitals:   08/22/24 0859 08/22/24 1013  BP: 121/77 120/77  Pulse: 89 88  Resp:  16  Temp:  98.1 F (36.7 C)  SpO2:  97%    Intake/Output Summary (Last 24 hours) at 08/22/2024 1743 Last data filed at 08/22/2024 0900 Gross per 24 hour  Intake 623 ml  Output 525 ml  Net 98 ml   Filed Weights   08/16/24 1258   Weight: 115.7 kg   Weight change:  Body mass index is 34.58 kg/m.   Physical Exam: General exam: Pleasant, elderly Caucasian male. Skin: No rashes, lesions or ulcers. HEENT: Atraumatic, normocephalic, no obvious bleeding Lungs: Clear to auscultation bilaterally,  CVS: S1, S2, no murmur,   GI/Abd: Soft, nontender, nondistended, bowel sound present,  CNS: Alert, awake, oriented x 3 Psychiatry: Mood appropriate Extremities: No pedal edema, no calf tenderness,   Data Review: I have personally reviewed the laboratory data and studies available.  F/u labs ordered Unresulted Labs (From admission, onward)     Start     Ordered   08/21/24 0500  CBC  Daily,   R     Question:  Specimen collection method  Answer:  IV Team=IV Team collect   08/20/24 9089            Signed, Chapman Rota, MD [facility] 08/22/2024

## 2024-08-22 NOTE — Plan of Care (Signed)

## 2024-08-22 NOTE — Plan of Care (Signed)
  Problem: Activity: Goal: Risk for activity intolerance will decrease Outcome: Progressing   Problem: Nutrition: Goal: Adequate nutrition will be maintained Outcome: Progressing   Problem: Pain Managment: Goal: General experience of comfort will improve and/or be controlled Outcome: Progressing   Problem: Skin Integrity: Goal: Risk for impaired skin integrity will decrease Outcome: Progressing

## 2024-08-22 NOTE — Progress Notes (Signed)
 Cardiology Progress Note  Patient ID: Marvin Carr MRN: [id number] DOB: 1956-07-03 Date of Encounter: 08/22/2024 Primary Cardiologist: Jayson Sierras, MD  Subjective   Chief Complaint: None.   HPI: Still having BM and flatus. No CP. Hgb slightly lower, no signs of bleeding.   ROS:  All other ROS reviewed and negative. Pertinent positives noted in the HPI.      Physical Exam   Vitals:   08/21/24 1724 08/21/24 2040 08/21/24 2127 08/22/24 0503  BP: 109/74 104/60 104/60 117/68  Pulse: 89 93 93 83  Resp:      Temp:  99.9 F (37.7 C)  97.7 F (36.5 C)  TempSrc:  Oral  Oral  SpO2: 99% 96%  97%  Weight:      Height:        Intake/Output Summary (Last 24 hours) at 08/22/2024 0854 Last data filed at 08/22/2024 0417 Gross per 24 hour  Intake 3306.3 ml  Output 275 ml  Net 3031.3 ml       08/16/2024   12:58 PM 08/05/2024    8:11 AM 08/04/2024    9:41 AM  Last 3 Weights  Weight (lbs) 255 lb 261 lb 9.6 oz 263 lb 9.6 oz  Weight (kg) 115.667 kg 118.661 kg 119.568 kg    Body mass index is 34.58 kg/m.  General: Well nourished, well developed, in no acute distress Head: Atraumatic, normal size  Eyes: PEERLA, EOMI  Neck: Supple, no JVD Endocrine: No thryomegaly Cardiac: Normal S1, S2; RRR; no murmurs, rubs, or gallops Lungs: Clear to auscultation bilaterally, no wheezing, rhonchi or rales  Abd: Soft, nontender, no hepatomegaly  Ext: No edema, pulses 2+ Musculoskeletal: No deformities, BUE and BLE strength normal and equal Skin: Warm and dry, no rashes   Neuro: Alert and oriented to person, place, time, and situation, CNII-XII grossly intact, no focal deficits  Psych: Normal mood and affect   Cardiac Studies  LHC 07/04/2024     Mid Cx to Dist Cx lesion is 80% stenosed.   2nd Diag lesion is 95% stenosed.   A stent was successfully placed.   Post intervention, there is a 0% residual stenosis.   1.  High-grade distal left circumflex lesion treated with one 3.5 x 16 mm  Synergy XD stent postdilated to 3.75 mm. 2.  LVEDP of 20 mmHg.   Recommendation: Dual antiplatelet therapy for at least 6 months but preferably 1 year with aspirin  and Brilinta  followed by Brilinta  monotherapy indefinitely.  Patient Profile  Marvin Carr is a 68 y.o. male with metastatic lesion carcinoma, CAD status post PCI to left circumflex on 07/04/2024, HFpEF, diabetes, hypertension admitted on 08/16/2024 with small bowel obstruction and concerns for ischemic bowel.  Cardiology was consulted for antiplatelet management.  Assessment & Plan   # Small bowel obstruction # Ischemic bowel # Metastatic cholangiocarcinoma # Acute blood loss anemia - Has been treated with NG tube.  He has had bowel movements and is passing gas.  Suspect the obstruction is resolving. - No planned procedure from what I can tell.  # CAD status post PCI to mid circumflex on 07/04/2024 - Bowel obstruction is resolving.  Hemoglobin values trended down overnight.  Would continue Brilinta  90 mg twice daily.  Would recommend Brilinta  monotherapy moving forward.  Suspect he has high bleeding risk given cancer and bowel obstruction with concern for ischemic bowel.  I think he is okay to forego aspirin .  Would monitor today on Brilinta .  As long as blood counts are stable can  be discharged on Brilinta  monotherapy.  He has no angina.  # HFpEF # Diabetes # Hypertension - Home meds.     For questions or updates, please contact [facility] Please consult [email or web address] for contact info under        Signed, Darryle T. Barbaraann, MD, [facility] [place]  [facility]  08/22/2024 8:54 AM

## 2024-08-23 LAB — CBC
HCT: 28.3 % — ABNORMAL LOW (ref 39.0–52.0)
Hemoglobin: 8.9 g/dL — ABNORMAL LOW (ref 13.0–17.0)
MCH: 30.7 pg (ref 26.0–34.0)
MCHC: 31.4 g/dL (ref 30.0–36.0)
MCV: 97.6 fL (ref 80.0–100.0)
Platelets: 169 K/uL (ref 150–400)
RBC: 2.9 MIL/uL — ABNORMAL LOW (ref 4.22–5.81)
RDW: 20.2 % — ABNORMAL HIGH (ref 11.5–15.5)
WBC: 7.1 K/uL (ref 4.0–10.5)
nRBC: 1.1 % — ABNORMAL HIGH (ref 0.0–0.2)

## 2024-08-23 LAB — GLUCOSE, CAPILLARY
Glucose-Capillary: 137 mg/dL — ABNORMAL HIGH (ref 70–99)
Glucose-Capillary: 97 mg/dL (ref 70–99)

## 2024-08-23 NOTE — Discharge Summary (Signed)
 Physician Discharge Summary  Marvin Carr FMW:[id number] DOB: 05/28/1956 DOA: 08/16/2024  PCP: [name], MD  Admit date: 08/16/2024 Discharge date: 08/23/2024  Admitted from: Home Discharge disposition: Home  Recommendations at discharge:  Continue with soft diet for next few days and advance to regular as tolerated Per cardiology recommendation aspirin  has been stopped.  Continue Brilinta  Given your low A1c and compromised appetite due to cancer, I would recommend to stop metformin  and Farxiga . Given your compromised appetite and hydration, I would recommend not to take Bumex  daily.  Can continue Lasix  20 mg 3 times a week MWF as before.  Monitor your symptoms like difficulty breathing and swollen extremities to understand your fluid balance.  If needed, can increase Lasix  to daily or add back Bumex . Continue to follow-up with oncology and cardiology as an outpatient  Subjective: Patient was seen and examined this morning. Sitting up in recliner.  Not in distress. Tolerated liquid diet.  Advanced to soft diet this morning*** Afebrile, hemodynamically stable  Labs from this morning with hemoglobin at 8.9 which is better than yesterday    Brief narrative: Marvin Carr is a 68 y.o. 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   -Low-density lesions consistent with metastatic disease with irregular omental densities concerning for peritoneal carcinomatosis.   Admitted to [facility] General Surgery was consulted  Hospital  course: Ischemia of small bowel with pneumatosis  small bowel obstruction Presented with abdominal pain/cramping in the setting of peritoneal carcinomatosis, chemo induced ileus General Surgery was consulted Started on conservative management with IV fluid, NG decompression, IV antibiotic, IV analgesics, IV PPI Gradually improved. In the last 48 hours, tolerated NG tube clamping trial, had multiple BM. Tolerated full liquid diet.  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  Diagnosed in 2022. Patient was initially initially treated with cisplatin , gemcitabine , and durvalumab  for 6 months.  Despite that, patient had disease progression in 2025, treatment delayed by hip replacement in May 2025.  06/2024, patient was transitioned to FOLFOX High risk for any surgical intervention.   Oncology following the patient. Patient states he has an appointment with oncology for next chemotherapy on Monday morning 12/15.  AKI Creatinine peaked at 1.67 on 12/7 Improved now to normal  Recent Labs    07/15/24 0839 07/16/24 2017 07/17/24 0637 08/05/24 0749 08/12/24 1223 08/16/24 1325 08/17/24 0208 08/18/24 0435 08/18/24 2358 08/21/24 0453 08/22/24 0022  BUN 19 27*  --  15 19 20 23  24* 20 10 7*  CREATININE 0.91 1.12 1.13 0.98 1.17 1.17 1.67* 1.48* 1.29* 0.94 1.03  CO2 28 21*  --  26 24 22 28 29 27 27 22    Hypokalemia/hypomagnesemia/hypophosphatemia Replacements given Recent Labs  Lab 08/17/24 0208 08/18/24 0435 08/18/24 2358 08/21/24 0453 08/22/24 0022  K 3.3* 3.1* 3.7 3.0* 3.0*  MG  --  1.8 2.4 1.5* 1.7  PHOS  --   --  2.8  2.5 2.4*    H/o CAD s/p Lcx stent 10/25 HLD PTA meds-had a plan to continue DAPT with Brilinta /aspirin  for at least 6 months but preferably 1 year to be followed by  Brilinta  monotherapy indefinitely.   Given heme positive NG aspirate, DAPT were held Cardiology was consulted  Did not have any angina.  EKG remains normal.. 12/11, cardiology recommended to resume Brilinta  and keep aspirin  on hold. Continue Lipitor and fenofibrate  as before Continue to follow-up with cardiology as an outpatient.   Chronic diastolic CHF HTN Echo from 06/2024 with EF of 60 to 65%, grade 1 diastolic dysfunction noted.   PTA meds- metoprolol  tartrate 12.5 mg twice daily, Procardia  30 mg daily, Bumex  2 mg daily and Lasix  20 mg 3 times a week MWF. Currently continued on metoprolol  and Procardia .  Diuretics were held in the hospital. At discharge, continue metoprolol  and Procardia  as before. Given your compromised appetite and hydration, I would recommend to stop taking Bumex  daily.  Can continue Lasix  20 mg 3 times a week MWF as before.  Patient has been instructed to monitor symptoms like difficulty breathing and swollen extremities to understand fluid balance.  If needed, can increase Lasix  to daily or add back Bumex  under the supervision of your cardiologist   Type 2 diabetes mellitus A1c 5.7 on 06/2024 PTA meds-metformin  500 mg daily, Farxiga  10 mg daily.  Currently both on hold Given his low A1c and compromised appetite due to malignancy, I would recommend to stop both these medicines. Recent Labs  Lab 08/22/24 0745 08/22/24 1156 08/22/24 1646 08/22/24 2247 08/23/24 0754  GLUCAP 103* 98 103* 118* 137*   Mild anemia  Hemoglobin was slightly downtrending yesterday.  Stable on repeat labs today Recent Labs    04/23/24 1245 04/23/24 1255 06/24/24 0750 07/01/24 0805 08/18/24 2358 08/20/24 1903 08/21/24 0453 08/22/24 0022 08/23/24 0326  HGB  --    < > 10.5*   < > 9.4* 10.0* 9.1* 8.6* 8.9*  MCV  --    < > 93.8   < > 98.0 96.4 98.0 97.9 97.6  FERRITIN 84  --  590*  --   --   --   --   --   --   TIBC 375  --  301  --   --   --   --   --   --   IRON 24*  --  55   --   --   --   --   --   --    < > = values in this interval not displayed.   Anxiety/depression Continue Wellbutrin  300 mg daily, Ambien  5 mg nightly as needed  BPH Flomax   Obesity 1 Body mass index is 34.58 kg/m. Patient has been advised to make an attempt to improve diet and exercise patterns to aid in weight loss.   Nutrition Status:         Mobility: Able to ambulate independently.  Encourage ambulation  Goals of care   Code Status: Limited: Do not attempt resuscitation (DNR) -DNR-LIMITED -Do Not Intubate/DNI   Palliative care consult appreciated  Diet:  Diet Order             DIET SOFT Room service appropriate? Yes; Fluid consistency: Thin  Diet effective now           Diet general                   Nutritional status:  Body mass index is  34.58 kg/m.       Wounds:  -    Discharge Medications:   Allergies as of 08/23/2024   No Known Allergies      Medication List     PAUSE taking these medications    bumetanide  2 MG tablet Wait to take this until your doctor or other care provider tells you to start again. Commonly known as: BUMEX  Take 1 tablet (2 mg total) by mouth daily as needed. What changed: when to take this       STOP taking these medications    aspirin  EC 81 MG tablet   Farxiga  10 MG Tabs tablet Generic drug: dapagliflozin  propanediol   metFORMIN  500 MG 24 hr tablet Commonly known as: GLUCOPHAGE -XR   olmesartan  40 MG tablet Commonly known as: BENICAR        TAKE these medications    pramipexole  0.75 MG tablet Commonly known as: MIRAPEX  Take 1 tablet (0.75 mg total) by mouth 3 (three) times daily. The timing of this medication is very important.   acetaminophen  500 MG tablet Commonly known as: TYLENOL  Take 1,000 mg by mouth every 8 (eight) hours as needed for mild pain (pain score 1-3) or moderate pain (pain score 4-6).   atorvastatin  20 MG tablet Commonly known as: LIPITOR Take 1 tablet (20 mg total) by  mouth daily.   benzonatate  200 MG capsule Commonly known as: TESSALON  Take 1 capsule (200 mg total) by mouth 3 (three) times daily as needed for cough.   buPROPion  300 MG 24 hr tablet Commonly known as: WELLBUTRIN  XL Take 300 mg by mouth daily.   cetirizine  10 MG tablet Commonly known as: ZYRTEC  Take 10 mg by mouth daily.   chlorproMAZINE  25 MG tablet Commonly known as: THORAZINE  Take 1 tablet (25 mg total) by mouth 3 (three) times daily as needed for hiccoughs.   cyclobenzaprine  10 MG tablet Commonly known as: FLEXERIL  Take 1 tablet (10 mg total) by mouth every 6 (six) hours as needed for muscle spasms.   dexamethasone  4 MG tablet Commonly known as: DECADRON  Take 2 tablets (8 mg total) by mouth daily. Start the day after chemotherapy for 2 days. Take with food.   diphenoxylate -atropine  2.5-0.025 MG tablet Commonly known as: LOMOTIL  Take 1 tablet by mouth 4 (four) times daily as needed for diarrhea or loose stools.   docusate sodium  100 MG capsule Commonly known as: COLACE Take 200 mg by mouth as needed for mild constipation.   Eszopiclone 3 MG Tabs Take 3 mg by mouth at bedtime.   famotidine  20 MG tablet Commonly known as: PEPCID  Take 20 mg by mouth at bedtime.   fenofibrate  160 MG tablet Take 160 mg by mouth daily.   furosemide  20 MG tablet Commonly known as: LASIX  Take 1 tablet (20 mg total) by mouth 3 (three) times a week. Monday, Wednesday and Friday   lidocaine  5 % Commonly known as: LIDODERM  Place 1 patch onto the skin as needed (for pain). Remove & Discard patch within 12 hours or as directed by MD   loperamide  2 MG capsule Commonly known as: IMODIUM  Take 1 capsule (2 mg total) by mouth as needed for diarrhea or loose stools. Take 2 capsules after the first watery stool, and then one capsule after each subsequent watery stool. Do not exceed 8 capsules in 24 hours   magic mouthwash (lidocaine , diphenhydrAMINE , alum & mag hydroxide) suspension Swish and  swallow 5 mLs 3 (three) times daily as needed for mouth pain.   Magnesium   Oxide -Mg Supplement 200 MG Tabs Take 200 mg by mouth daily.   metoCLOPramide  10 MG tablet Commonly known as: REGLAN  Take 1 tablet (10 mg total) by mouth every 8 (eight) hours as needed (Hiccups).   NIFEdipine  30 MG 24 hr tablet Commonly known as: PROCARDIA -XL/NIFEDICAL-XL Take 1 tablet (30 mg total) by mouth daily.   nitroGLYCERIN  0.4 MG SL tablet Commonly known as: NITROSTAT  Place 0.4 mg under the tongue every 5 (five) minutes x 3 doses as needed for chest pain (if no relief after 2nd dose, proceed to ED or call 911).   omeprazole 40 MG capsule Commonly known as: PRILOSEC Take 40 mg by mouth daily.   ondansetron  8 MG tablet Commonly known as: Zofran  Take 1 tablet (8 mg total) by mouth every 8 (eight) hours as needed for nausea or vomiting. Start on the third day after chemotherapy.   pantoprazole  40 MG tablet Commonly known as: Protonix  Take 1 tablet (40 mg total) by mouth daily.   prochlorperazine  10 MG tablet Commonly known as: COMPAZINE  Take 1 tablet (10 mg total) by mouth every 6 (six) hours as needed for nausea or vomiting.   tamsulosin  0.4 MG Caps capsule Commonly known as: FLOMAX  Take 0.4 mg by mouth daily.   ticagrelor  90 MG Tabs tablet Commonly known as: BRILINTA  Take 1 tablet (90 mg total) by mouth 2 (two) times daily.   traMADol  50 MG tablet Commonly known as: ULTRAM  Take 50 mg by mouth at bedtime as needed for severe pain (pain score 7-10).   traZODone  100 MG tablet Commonly known as: DESYREL  Take 50 mg by mouth at bedtime as needed for sleep.   triamcinolone 0.1 % paste Commonly known as: KENALOG Use as directed 1 Application in the mouth or throat daily as needed (for mouth ulcers).         Follow ups:    Discharge Instructions:   Discharge Instructions     Call MD for:  difficulty breathing, headache or visual disturbances   Complete by: As directed    Call MD  for:  extreme fatigue   Complete by: As directed    Call MD for:  hives   Complete by: As directed    Call MD for:  persistant dizziness or light-headedness   Complete by: As directed    Call MD for:  persistant nausea and vomiting   Complete by: As directed    Call MD for:  severe uncontrolled pain   Complete by: As directed    Call MD for:  temperature >100.4   Complete by: As directed    Diet general   Complete by: As directed    Stick to soft diet for next few days and advance to regular as tolerated   Discharge instructions   Complete by: As directed    Recommendations at discharge:   Continue with soft diet for next few days and advance to regular as tolerated  Per cardiology recommendation aspirin  has been stopped.  Continue Brilinta   Given your low A1c and compromised appetite due to cancer, I would recommend to stop metformin  and Farxiga .  Given your compromised appetite and hydration, I would recommend not to take Bumex  daily.  Can continue Lasix  20 mg 3 times a week MWF as before.  Monitor your symptoms like difficulty breathing and swollen extremities to understand your fluid balance.  If needed, can increase Lasix  to daily or add back Bumex .  Continue to follow-up with oncology and cardiology as an outpatient  Discharge instructions for CHF Check weight daily -preferably same time every day. Restrict fluid intake to 1200 ml daily Restrict salt intake to less than 2 g daily. Call MD if you have one of the following symptoms 1) 3 pound weight gain in 24 hours or 5 pounds in 1 week  2) swelling in the hands, feet or stomach  3) progressive shortness of breath 4) if you have to sleep on extra pillows at night in order to breathe       General discharge instructions: Follow with Primary MD [name], MD in 7 days  Please request your PCP  to go over your hospital tests, procedures, radiology results at the follow up. Please get your medicines reviewed and  adjusted.  Your PCP may decide to repeat certain labs or tests as needed. Do not drive, operate heavy machinery, perform activities at heights, swimming or participation in water  activities or provide baby sitting services if your were admitted for syncope or siezures until you have seen by Primary MD or a Neurologist and advised to do so again. [place]  Controlled Substance Reporting System database was reviewed. Do not drive, operate heavy machinery, perform activities at heights, swim, participate in water  activities or provide baby-sitting services while on medications for pain, sleep and mood until your outpatient physician has reevaluated you and advised to do so again.  You are strongly recommended to comply with the dose, frequency and duration of prescribed medications. Activity: As tolerated with Full fall precautions use walker/cane & assistance as needed Avoid using any recreational substances like cigarette, tobacco, alcohol, or non-prescribed drug. If you experience worsening of your admission symptoms, develop shortness of breath, life threatening emergency, suicidal or homicidal thoughts you must seek medical attention immediately by calling 911 or calling your MD immediately  if symptoms less severe. You must read complete instructions/literature along with all the possible adverse reactions/side effects for all the medicines you take and that have been prescribed to you. Take any new medicine only after you have completely understood and accepted all the possible adverse reactions/side effects.  Wear Seat belts while driving. You were cared for by a hospitalist during your hospital stay. If you have any questions about your discharge medications or the care you received while you were in the hospital after you are discharged, you can call the unit and ask to speak with the hospitalist or the covering physician. Once you are discharged, your primary care physician will handle any  further medical issues. Please note that NO REFILLS for any discharge medications will be authorized once you are discharged, as it is imperative that you return to your primary care physician (or establish a relationship with a primary care physician if you do not have one).   Increase activity slowly   Complete by: As directed        Discharge Exam:   Vitals:   08/22/24 2001 08/22/24 2251 08/23/24 0603 08/23/24 0850  BP: 105/74 105/74 (!) 88/53 121/88  Pulse: 98 98 89 95  Resp: 15  17 16   Temp: (!) 97.3 F (36.3 C)  98.1 F (36.7 C) 98.1 F (36.7 C)  TempSrc: Oral  Oral Oral  SpO2: 96%  96% 95%  Weight:      Height:        Body mass index is 34.58 kg/m.   General exam: Pleasant, elderly Caucasian male. Skin: No rashes, lesions or ulcers. HEENT: Atraumatic, normocephalic, no obvious bleeding Lungs: Clear to auscultation bilaterally,  CVS: S1, S2, no murmur,   GI/Abd: Soft, nontender, nondistended, bowel sound present,  CNS: Alert, awake, oriented x 3 Psychiatry: Mood appropriate Extremities: No pedal edema, no calf tenderness,    The results of significant diagnostics from this hospitalization (including imaging, microbiology, ancillary and laboratory) are listed below for reference.    Procedures and Diagnostic Studies:   DG Abd 1 View Result Date: 08/17/2024 EXAM: 1 VIEW XRAY OF THE ABDOMEN 08/17/2024 12:28:00 AM COMPARISON: None available. CLINICAL HISTORY: Encounter for nasogastric (NG) tube placement. FINDINGS: LINES, TUBES AND DEVICES: Enteric tube courses below the hemidiaphragm with the tip overlying the gastric lumen and side port not well visualized, likely in the region of the gastroesophageal junction. BOWEL: Nonobstructive bowel gas pattern. SOFT TISSUES: No abnormal calcifications. BONES: No acute fracture. LIMITATIONS: Right flank and pelvis collimated off view. IMPRESSION: 1. Enteric tube tip overlies the gastric lumen; side port is not well visualized and  is likely at the gastroesophageal junction, consider advancing the tube slightly to ensure the side port is intragastric. Electronically signed by: Morgane Naveau MD 08/17/2024 12:33 AM EST RP Workstation: [id number]   CT ANGIO GI BLEED Result Date: 08/16/2024 EXAM: CTA ABDOMEN AND PELVIS WITH CONTRAST 08/16/2024 03:23:46 PM TECHNIQUE: CTA images of the abdomen and pelvis with intravenous contrast. 100 mL iohexol  (OMNIPAQUE ) 350 MG/ML injection. Three-dimensional MIP/volume rendered formations were performed. Automated exposure control, iterative reconstruction, and/or weight based adjustment of the mA/kV was utilized to reduce the radiation dose to as low as reasonably achievable. COMPARISON: 05/15/2024 and 06/03/2024. CLINICAL HISTORY: Suspected GI bleed. FINDINGS: VASCULATURE: GI BLEED: No active extravasation of contrast within the GI tract. AORTA: No acute finding. No abdominal aortic aneurysm. No dissection. CELIAC TRUNK: No acute finding. No occlusion or significant stenosis. SUPERIOR MESENTERIC ARTERY: No acute finding. No occlusion or significant stenosis. INFERIOR MESENTERIC ARTERY: No acute finding. No occlusion or significant stenosis. RENAL ARTERIES: No acute finding. No occlusion or significant stenosis. ILIAC ARTERIES: No acute finding. No occlusion or significant stenosis. ABDOMEN/PELVIS: LOWER CHEST: Visualized portion of the lower chest demonstrates no acute abnormality. LIVER: Hepatic low density is again noted consistent with metastatic disease. GALLBLADDER AND BILE DUCTS: Cholelithiasis. No biliary ductal dilatation. SPLEEN: The spleen is unremarkable. PANCREAS: The pancreas is unremarkable. ADRENAL GLANDS: Bilateral adrenal glands demonstrate no acute abnormality. KIDNEYS, URETERS AND BLADDER: No stones in the kidneys or ureters. No hydronephrosis. No perinephric or periureteral stranding. Urinary bladder is unremarkable. GI AND BOWEL: Severely dilated small bowel loops are noted with  intramural gas in multiple small bowel loops consistent with ischemic bowel disease. No colonic dilatation is noted. Stomach and duodenal sweep demonstrate no acute abnormality. REPRODUCTIVE: Reproductive organs are unremarkable. PERITONEUM AND RETROPERITONEUM: Mild ascites is noted. 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 No free air. LYMPH NODES: No lymphadenopathy. BONES AND SOFT TISSUES: Status post bilateral hip arthroplasties. No acute abnormality of the bones. No acute soft tissue abnormality. IMPRESSION: 1. Severely dilated small bowel loops with intramural gas, consistent with ischemic bowel disease. This finding was discussed with Dr. Melvenia at 3:38 pm on 08/16/2024. 2. No active gastrointestinal bleeding identified. 3. Hepatic low-density lesions consistent with metastatic disease. 4. 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 Electronically signed by: Lynwood Seip MD 08/16/2024 03:40 PM EST RP Workstation: [id number]     Labs:   Basic Metabolic Panel: Recent Labs  Lab 08/17/24 0208 08/18/24 0435 08/18/24 2358 08/21/24 0453 08/22/24 0022  NA 138 138 138 134* 137  K 3.3* 3.1* 3.7  3.0* 3.0*  CL 99 100 100 100 104  CO2 28 29 27 27 22   GLUCOSE 125* 80 91 114* 92  BUN 23 24* 20 10 7*  CREATININE 1.67* 1.48* 1.29* 0.94 1.03  CALCIUM  8.3* 8.0* 8.2* 7.8* 8.0*  MG  --  1.8 2.4 1.5* 1.7  PHOS  --   --  2.8 2.5 2.4*   GFR Estimated Creatinine Clearance: 88.8 mL/min (by C-G formula based on SCr of 1.03 mg/dL). Liver Function Tests: Recent Labs  Lab 08/16/24 1325 08/17/24 0208  AST 100* 107*  ALT 75* 87*  ALKPHOS 141* 122  BILITOT 0.9 1.2  PROT 6.2* 5.3*  ALBUMIN  3.3* 2.2*   Recent Labs  Lab 08/16/24 1325  LIPASE 20   No results for input(s): AMMONIA in the last 168 hours. Coagulation profile Recent Labs  Lab 08/16/24 1325  INR 1.1    CBC: Recent Labs  Lab 08/16/24 1325 08/17/24 0208 08/18/24 2358  08/20/24 1903 08/21/24 0453 08/22/24 0022 08/23/24 0326  WBC 3.0*   < > 5.8 7.4 8.2 8.1 7.1  NEUTROABS 1.9  --   --   --   --   --   --   HGB 11.0*   < > 9.4* 10.0* 9.1* 8.6* 8.9*  HCT 34.0*   < > 29.9* 31.9* 28.8* 27.5* 28.3*  MCV 95.2   < > 98.0 96.4 98.0 97.9 97.6  PLT 178   < > 180 197 181 180 169   < > = values in this interval not displayed.   Cardiac Enzymes: No results for input(s): CKTOTAL, CKMB, CKMBINDEX, TROPONINI in the last 168 hours. BNP: Invalid input(s): POCBNP CBG: Recent Labs  Lab 08/22/24 0745 08/22/24 1156 08/22/24 1646 08/22/24 2247 08/23/24 0754  GLUCAP 103* 98 103* 118* 137*   D-Dimer No results for input(s): DDIMER in the last 72 hours. Hgb A1c No results for input(s): HGBA1C in the last 72 hours. Lipid Profile No results for input(s): CHOL, HDL, LDLCALC, TRIG, CHOLHDL, LDLDIRECT in the last 72 hours. Thyroid  function studies No results for input(s): TSH, T4TOTAL, T3FREE, THYROIDAB in the last 72 hours.  Invalid input(s): FREET3 Anemia work up No results for input(s): VITAMINB12, FOLATE, FERRITIN, TIBC, IRON, RETICCTPCT in the last 72 hours. Microbiology No results found for this or any previous visit (from the past 240 hours).  Time coordinating discharge: 45 minutes  Signed: Charnell Peplinski  [facility] 08/23/2024, 10:27 AM

## 2024-08-23 NOTE — Progress Notes (Signed)
 Physical Therapy Treatment & Discharge Patient Details Name: Marvin Carr MRN: [id number] DOB: 11-10-1955 Today's Date: 08/23/2024   History of Present Illness 68 y.o. male admitted 08/16/24 with abdominal pain. Workup for small bowel ischemia with pneumatosis, SBO requiring NGT decompression. PMH includes nonobstructive CAD, HTN, T2DM, cholangiocarcinoma with liver lesions, peritoneal carcinomatosis, malignant ascites, depression, anxiety, insomnia, OSA on CPAP.   PT Comments  Pt progressing well with mobility; mod indep with mobility and self-care tasks. All education reviewed, HEP provided. Pt reports no further questions or concerns, preparing for d/c home today. Will d/c acute PT.      If plan is discharge home, recommend the following: Assist for transportation   Can travel by private vehicle      Yes  Equipment Recommendations  None recommended by PT    Recommendations for Other Services       Precautions / Restrictions Precautions Precautions: None Recall of Precautions/Restrictions: Intact Restrictions Weight Bearing Restrictions Per Provider Order: No     Mobility  Bed Mobility               General bed mobility comments: received sitting in recliner    Transfers Overall transfer level: Independent Equipment used: None               General transfer comment: multiple sit<>stands from recliner indep without DME    Ambulation/Gait Ambulation/Gait assistance: Modified independent (Device/Increase time)   Assistive device: IV Pole, None Gait Pattern/deviations: Step-through pattern, Decreased stride length Gait velocity: Decreased     General Gait Details: ambulating throughout room and to/from bathroom mod indep with IV pole, also able to walk without UE support   Stairs Stairs:  (pt declined stair training, reports no concerns ascending steps to enter home)           Wheelchair Mobility     Tilt Bed    Modified [name] (Stroke  Patients Only)       Balance Overall balance assessment: Modified Independent Sitting-balance support: No upper extremity supported, Feet supported Sitting balance-Leahy Scale: Good Sitting balance - Comments: mod indep to don briefs sitting down (uses cane to assist)   Standing balance support: No upper extremity supported, During functional activity Standing balance-Leahy Scale: Fair Standing balance comment: indep toileting without UE support                            Communication Communication Communication: No apparent difficulties  Cognition Arousal: Alert Behavior During Therapy: WFL for tasks assessed/performed   PT - Cognitive impairments: No apparent impairments                         Following commands: Intact      Cueing Cueing Techniques: Verbal cues  Exercises Other Exercises Other Exercises: Medbridge HEP handout provided (squats with chair touch, standing calf raises, standing hip ext, standing partial squats, SLR, supine bridges)    General Comments General comments (skin integrity, edema, etc.): reviewed educ re: role of acute PT, POC, activity recommendations, abdominal prec for comfort, generalized BLE strengthening (HEP provided and reviewed), importance of mobility, pulmonary hygiene, potential d/c needs      Pertinent Vitals/Pain Pain Assessment Pain Assessment: No/denies pain    Home Living                          Prior Function  PT Goals (current goals can now be found in the care plan section) Progress towards PT goals: Goals met/education completed, patient discharged from PT    Frequency    Min 1X/week      PT Plan      Co-evaluation              AM-PAC PT 6 Clicks Mobility   Outcome Measure  Help needed turning from your back to your side while in a flat bed without using bedrails?: None Help needed moving from lying on your back to sitting on the side of a flat bed  without using bedrails?: None Help needed moving to and from a bed to a chair (including a wheelchair)?: None Help needed standing up from a chair using your arms (e.g., wheelchair or bedside chair)?: None Help needed to walk in hospital room?: None Help needed climbing 3-5 steps with a railing? : None 6 Click Score: 24    End of Session   Activity Tolerance: Patient tolerated treatment well Patient left: in chair;with call bell/phone within reach Nurse Communication: Mobility status PT Visit Diagnosis: Unsteadiness on feet (R26.81)     Time: 8991-8972 PT Time Calculation (min) (ACUTE ONLY): 19 min  Charges:    $Therapeutic Activity: 8-22 mins PT General Charges $$ ACUTE PT VISIT: 1 Visit                      Darice Almas, PT, DPT Acute Rehabilitation Services  Personal: Secure Chat Rehab Office: [phone]  Darice LITTIE Almas 08/23/2024, 10:40 AM

## 2024-08-23 NOTE — Progress Notes (Signed)
° °  Progress notes reviewed- Dr. Barbaraann suggested remaining on Brilinta  as long as there was not bleeding and he will not need procedures. Remain off aspirin . H/H stable overnight. He reports no chest pain - had PCI to the LCX on 10/24.  No further suggestions at this time. Cardiology will sign-off. Call with questions.  [facility] will sign off.   Medication Recommendations:  Brilinta  90 mg BID Other recommendations (labs, testing, etc):  none Follow up as an outpatient:  Dr. Debera Vinie KYM Mona, MD, [facility], FNLA, FACP  [place]  [facility]  Medical Director of the [facility] Lipid Disorders &  Cardiovascular Risk Reduction Clinic Diplomate of the American Board of Clinical Lipidology Attending Cardiologist  Direct Dial: [phone]  Fax: [phone]  Website:  [email or web address]

## 2024-08-23 NOTE — Plan of Care (Signed)
   Problem: Education: Goal: Knowledge of General Education information will improve Description Including pain rating scale, medication(s)/side effects and non-pharmacologic comfort measures Outcome: Progressing

## 2024-08-24 NOTE — Progress Notes (Deleted)
° °  Cardiology Office Note    Date:  08/24/2024  ID:  Marvin Carr, DOB 06/12/1956, MRN [id number] Cardiologist: Jayson Sierras, MD Cardiology APP:  Johnson Laymon HERO, PA-C { :  History of Present Illness:    Marvin Carr is a 68 y.o. male with past medical history of CAD (nonobstructive disease by prior cardiac catheterization in 2014, low-risk NST in 02/2017, s/p DES to LCx in 06/2024 with medical management of diagonal stenosis recommended), HTN, HLD, Type II DM, OSA (on CPAP), orthostatic hypotension and cholangiocarcinoma with peritoneal carcinomatosis who presents to the office today for hospital follow-up.   He was examined by myself in 06/2024 following recent admission for chest pain.  He had undergone cardiac catheterization which showed 80% LCx stenosis and 95% D2 stenosis and he did have DES placement to the LCx and medical management was recommended of his diagonal disease given the small vessel size.  At follow-up, he denied any recurrent episodes of chest pain but did have baseline dyspnea on exertion which was unchanged.  His BP was soft it was recommended to reduce olmesartan  to 20 mg daily and given reports of myalgias, atorvastatin  was also reduced to 20 mg daily.  In the interim, he was in need to [facility] in 07/2024 for recurrent chest pain and troponin values were normal and his pain was reproducible on palpation and felt to be most consistent with costochondritis.  Was started on Toprol -XL 25 mg daily and was recommended to use lidocaine  patches.  He did follow-up with Dr. Zenaida on 08/04/2024 for cardio oncology and there was concern that his episodes of chest pain had been concerning for 5-FU induced vasospasm.  Therefore, Lopressor  was stopped and he was switched to nifedipine  30 mg daily.  He had a recurrent admission at [facility] from 12/6 - 08/23/2024 for small bowel obstruction and concerns for ischemic bowel.  Was treated with NG tube placement and did not  require surgery.  Given his high bleeding risk, it was recommended to stop ASA and continue with Brilinta  as monotherapy.  ROS: ***  Studies Reviewed:   EKG: EKG is*** ordered today and demonstrates ***   EKG Interpretation Date/Time:    Ventricular Rate:    PR Interval:    QRS Duration:    QT Interval:    QTC Calculation:   R Axis:      Text Interpretation:           Risk Assessment/Calculations:   {Does this patient have ATRIAL FIBRILLATION?:[phone]}           Physical Exam:   VS:  There were no vitals taken for this visit.   Wt Readings from Last 3 Encounters:  08/16/24 255 lb (115.7 kg)  08/05/24 261 lb 9.6 oz (118.7 kg)  08/04/24 263 lb 9.6 oz (119.6 kg)     GEN: Well nourished, well developed in no acute distress NECK: No JVD; No carotid bruits CARDIAC: ***RRR, no murmurs, rubs, gallops RESPIRATORY:  Clear to auscultation without rales, wheezing or rhonchi  ABDOMEN: Appears non-distended. No obvious abdominal masses. EXTREMITIES: No clubbing or cyanosis. No edema.  Distal pedal pulses are 2+ bilaterally.   Assessment and Plan:      {Are you ordering a CV Procedure (e.g. stress test, cath, DCCV, TEE, etc)?   Press F2        :[id number]}   Signed, Laymon HERO Johnson, PA-C

## 2024-08-25 ENCOUNTER — Inpatient Hospital Stay

## 2024-08-25 ENCOUNTER — Telehealth: Payer: Self-pay

## 2024-08-25 DIAGNOSIS — C787 Secondary malignant neoplasm of liver and intrahepatic bile duct: Secondary | ICD-10-CM

## 2024-08-25 DIAGNOSIS — Z95828 Presence of other vascular implants and grafts: Secondary | ICD-10-CM

## 2024-08-25 NOTE — Patient Instructions (Signed)
 Visit Information  Thank you for taking time to visit with me today. Please don't hesitate to contact me if I can be of assistance to you.  Call your doctor now or seek immediate medical care if: You have a fever. You are vomiting. You have new or worse belly pain. You cannot pass stools or gas.   Patient verbalizes understanding of instructions and care plan provided today and agrees to view in MyChart. Active MyChart status and patient understanding of how to access instructions and care plan via MyChart confirmed with patient.     The patient has been provided with contact information for the care management team and has been advised to call with any health related questions or concerns.   Please call the care guide team at [phone] if you need to cancel or reschedule your appointment.   Please call the Suicide and Crisis Lifeline: 988 if you are experiencing a Mental Health or Behavioral Health Crisis or need someone to talk to.  Lyliana Dicenso J. Dorwin Fitzhenry RN, MSN [facility], [facility] RN Care Manager Direct Dial: [phone]  Fax: [phone] Website: [email or web address]

## 2024-08-25 NOTE — Progress Notes (Signed)
°   08/25/24 1400  Spiritual Encounters  Type of Visit Follow up;Attempt (pt unavailable)  Referral source Chaplain assessment  Reason for visit Routine spiritual support  OnCall Visit No   Reason for Visit: Chaplain making scheduled check-in with Pt over the telephone for routine spiritual support.  Description of Visit: Attempting to call I was connected to voicemail and left a message stating I was just calling to offer support and check-in with Pt as I know he was hospitalized last week.  Plan of Care: I will continue to follow up with this Pt on a Bi-weekly basis     Maude Roll, MDiv Chaplain, [facility] [email or web address] [phone] 08/25/2024 3:00 PM

## 2024-08-25 NOTE — Transitions of Care (Post Inpatient/ED Visit) (Signed)
 08/25/2024  Name: Marvin Carr MRN: [id number] DOB: 23-Oct-1955  Today's TOC FU Call Status: Today's TOC FU Call Status:: Successful TOC FU Call Completed TOC FU Call Complete Date: 08/25/24  Patient's Name and Date of Birth confirmed. Name, DOB  Transition Care Management Follow-up Telephone Call Date of Discharge: 08/23/24 Discharge Facility: [facility]) Type of Discharge: Inpatient Admission Primary Inpatient Discharge Diagnosis:: Ischemia of small bowel with pneumatosis/small bowel obstruction How have you been since you were released from the hospital?: Better Any questions or concerns?: No  Items Reviewed: Did you receive and understand the discharge instructions provided?: Yes Medications obtained,verified, and reconciled?: Yes (Medications Reviewed) Any new allergies since your discharge?: No Dietary orders reviewed?: Yes Type of Diet Ordered:: Soft diet- moist, low fiber foods.  Avoid spicy, fried foods. Do you have support at home?: Yes People in Home [RPT]: alone, friend(s) Name of Support/Comfort Primary Source: Patient reports help of several friends  Medications Reviewed Today: Medications Reviewed Today     Reviewed by Emmaleah Meroney, RN (Case Manager) on 08/25/24 at 1252  Med List Status: <None>   Medication Order Taking? Sig Documenting Provider Last Dose Status Informant  acetaminophen  (TYLENOL ) 500 MG tablet [id number] Yes Take 1,000 mg by mouth every 8 (eight) hours as needed for mild pain (pain score 1-3) or moderate pain (pain score 4-6). [provider]  Active Self, Pharmacy Records  atorvastatin  (LIPITOR) 20 MG tablet [id number] Yes Take 1 tablet (20 mg total) by mouth daily. Johnson Laymon HERO, PA-C  Active Self, Pharmacy Records  benzonatate  (TESSALON ) 200 MG capsule [id number] Yes Take 1 capsule (200 mg total) by mouth 3 (three) times daily as needed for cough. Geofm Delon BRAVO, NP  Active Self, Pharmacy Records  bumetanide  (BUMEX ) 2 MG  tablet [id number]  Take 1 tablet (2 mg total) by mouth daily as needed.  Patient taking differently: Take 2 mg by mouth daily.   Johnson Laymon HERO, PA-C  Active Self, Pharmacy Records  buPROPion  (WELLBUTRIN  XL) 300 MG 24 hr tablet [id number] Yes Take 300 mg by mouth daily. [provider]  Active Self, Pharmacy Records  cetirizine  (ZYRTEC ) 10 MG tablet [id number] Yes Take 10 mg by mouth daily. [provider]  Active Self, Pharmacy Records  chlorproMAZINE  (THORAZINE ) 25 MG tablet [id number] Yes Take 1 tablet (25 mg total) by mouth 3 (three) times daily as needed for hiccoughs. [name] Callander, MD  Active Self, Pharmacy Records  cyclobenzaprine  (FLEXERIL ) 10 MG tablet [id number] Yes Take 1 tablet (10 mg total) by mouth every 6 (six) hours as needed for muscle spasms. Geofm Delon BRAVO, NP  Active Self, Pharmacy Records  dexamethasone  (DECADRON ) 4 MG tablet [id number] Yes Take 2 tablets (8 mg total) by mouth daily. Start the day after chemotherapy for 2 days. Take with food. Davonna Siad, MD  Active Self, Pharmacy Records  diphenoxylate -atropine  (LOMOTIL ) 2.5-0.025 MG tablet [id number]  Take 1 tablet by mouth 4 (four) times daily as needed for diarrhea or loose stools. Davonna Siad, MD  Active Self, Pharmacy Records  docusate sodium  (COLACE) 100 MG capsule [id number] Yes Take 200 mg by mouth as needed for mild constipation. [provider]  Active Self, Pharmacy Records  Eszopiclone 3 MG TABS [id number] Yes Take 3 mg by mouth at bedtime. [provider]  Active Self, Pharmacy Records  famotidine  (PEPCID ) 20 MG tablet [id number] Yes Take 20 mg by mouth at bedtime. [provider]  Active Self, Pharmacy Records  fenofibrate  160 MG tablet [id number] Yes  Take 160 mg by mouth daily. [provider]  Active Self, Pharmacy Records  furosemide  (LASIX ) 20 MG tablet [id number] Yes Take 1 tablet (20 mg total) by mouth 3 (three) times a week. Monday, Wednesday and Friday  Zenaida Morene PARAS, MD  Active Self, Pharmacy Records  lidocaine  (LIDODERM ) 5 % [id number] Yes Place 1 patch onto the skin as needed (for pain). Remove & Discard patch within 12 hours or as directed by MD [provider]  Active Self, Pharmacy Records  loperamide  (IMODIUM ) 2 MG capsule [id number] Yes Take 1 capsule (2 mg total) by mouth as needed for diarrhea or loose stools. Take 2 capsules after the first watery stool, and then one capsule after each subsequent watery stool. Do not exceed 8 capsules in 24 hours Kandala, Hyndavi, MD  Active Self, Pharmacy Records  magic mouthwash (lidocaine , diphenhydrAMINE , alum & mag hydroxide) suspension [id number] Yes Swish and swallow 5 mLs 3 (three) times daily as needed for mouth pain. Geofm Delon BRAVO, NP  Active Self, Pharmacy Records  Magnesium  Oxide -Mg Supplement 200 MG TABS [id number] Yes Take 200 mg by mouth daily. [provider]  Active Self, Pharmacy Records  magnesium  sulfate 2 GM/50ML IVPB [id number]     Active   metoCLOPramide  (REGLAN ) 10 MG tablet [id number] Yes Take 1 tablet (10 mg total) by mouth every 8 (eight) hours as needed (Hiccups). [name] Callander, MD  Active Self, Pharmacy Records  NIFEdipine  (PROCARDIA -XL/NIFEDICAL-XL) 30 MG 24 hr tablet [id number] Yes Take 1 tablet (30 mg total) by mouth daily. Zenaida Morene PARAS, MD  Active Self, Pharmacy Records  nitroGLYCERIN  (NITROSTAT ) 0.4 MG SL tablet [id number] Yes Place 0.4 mg under the tongue every 5 (five) minutes x 3 doses as needed for chest pain (if no relief after 2nd dose, proceed to ED or call 911). [provider]  Active Self, Pharmacy Records           Med Note DARILYN, DAWN S   Thu Jul 03, 2024 10:07 AM)    omeprazole (PRILOSEC) 40 MG capsule [id number] Yes Take 40 mg by mouth daily. [provider]  Active Self, Pharmacy Records  ondansetron  (ZOFRAN ) 8 MG tablet [id number] Yes Take 1 tablet (8 mg total) by mouth every 8 (eight) hours as needed for nausea or  vomiting. Start on the third day after chemotherapy. Davonna Siad, MD  Active Self, Pharmacy Records  pantoprazole  (PROTONIX ) 40 MG tablet [id number]  Take 1 tablet (40 mg total) by mouth daily.  Patient not taking: Reported on 08/16/2024   Ezenduka, Nkeiruka J, MD  Active Self, Pharmacy Records  pramipexole  (MIRAPEX ) 0.75 MG tablet [id number] Yes Take 1 tablet (0.75 mg total) by mouth 3 (three) times daily. Rogers Hai, MD  Active Self, Pharmacy Records           Med Note Taft, [place] S   Thu Jul 03, 2024 10:11 AM)    prochlorperazine  (COMPAZINE ) 10 MG tablet [id number] Yes Take 1 tablet (10 mg total) by mouth every 6 (six) hours as needed for nausea or vomiting. Kandala, Hyndavi, MD  Active Self, Pharmacy Records  tamsulosin  (FLOMAX ) 0.4 MG CAPS capsule [id number] Yes Take 0.4 mg by mouth daily. [provider]  Active Self, Pharmacy Records  ticagrelor  (BRILINTA ) 90 MG TABS tablet [id number] Yes Take 1 tablet (90 mg total) by mouth 2 (two) times daily. Al-Sultani, Anmar, MD  Active Self, Pharmacy Records  traMADol  (ULTRAM ) 50 MG tablet [id number] Yes Take 50 mg by mouth at bedtime as needed  for severe pain (pain score 7-10). [provider]  Active Self, Pharmacy Records  traZODone  (DESYREL ) 100 MG tablet [id number] Yes Take 50 mg by mouth at bedtime as needed for sleep. [provider]  Active Self, Pharmacy Records  triamcinolone (KENALOG) 0.1 % paste [id number] Yes Use as directed 1 Application in the mouth or throat daily as needed (for mouth ulcers). [provider]  Active Self, Pharmacy Records            Home Care and Equipment/Supplies: Were Home Health Services Ordered?: NA Any new equipment or medical supplies ordered?: NA  Functional Questionnaire: Do you need assistance with bathing/showering or dressing?: No Do you need assistance with meal preparation?: No Do you need assistance with eating?: No Do you have difficulty  maintaining continence: No Do you need assistance with getting out of bed/getting out of a chair/moving?: No Do you have difficulty managing or taking your medications?: No  Follow up appointments reviewed: PCP Follow-up appointment confirmed?: No (Patient advised to call PCP for follow up.) MD Provider Line Number:[phone] Given: No Specialist Hospital Follow-up appointment confirmed?: Yes Date of Specialist follow-up appointment?: 09/01/24 Follow-Up Specialty Provider:: Onoclogy Do you need transportation to your follow-up appointment?: No Do you understand care options if your condition(s) worsen?: Yes-patient verbalized understanding  SDOH Interventions Today    Flowsheet Row Most Recent Value  SDOH Interventions   Food Insecurity Interventions Intervention Not Indicated  Housing Interventions Intervention Not Indicated  Transportation Interventions Intervention Not Indicated  Utilities Interventions Intervention Not Indicated    Aaliayah Miao J. Moncerrath Berhe RN, MSN [facility]  [employer], [facility] RN Care Manager Direct Dial: [phone]  Fax: [phone] Website: [email or web address]

## 2024-08-26 ENCOUNTER — Ambulatory Visit: Admitting: Student

## 2024-08-26 ENCOUNTER — Other Ambulatory Visit: Payer: Self-pay | Admitting: *Deleted

## 2024-08-26 MED ORDER — TRAZODONE HCL 100 MG PO TABS: 50.0000 mg | ORAL_TABLET | Freq: Every evening | ORAL | 0 refills | Status: DC | PRN

## 2024-08-27 ENCOUNTER — Other Ambulatory Visit: Payer: Self-pay | Admitting: *Deleted

## 2024-08-27 ENCOUNTER — Encounter: Payer: Self-pay | Admitting: Oncology

## 2024-08-27 DIAGNOSIS — D509 Iron deficiency anemia, unspecified: Secondary | ICD-10-CM

## 2024-08-28 ENCOUNTER — Other Ambulatory Visit: Payer: Self-pay

## 2024-08-28 ENCOUNTER — Inpatient Hospital Stay (HOSPITAL_COMMUNITY)
Admission: EM | Admit: 2024-08-28 | Discharge: 2024-09-11 | Disposition: E | Attending: Internal Medicine | Admitting: Internal Medicine

## 2024-08-28 ENCOUNTER — Inpatient Hospital Stay

## 2024-08-28 ENCOUNTER — Encounter (HOSPITAL_COMMUNITY): Payer: Self-pay

## 2024-08-28 ENCOUNTER — Emergency Department (HOSPITAL_COMMUNITY)

## 2024-08-28 ENCOUNTER — Encounter: Payer: Self-pay | Admitting: *Deleted

## 2024-08-28 DIAGNOSIS — R6521 Severe sepsis with septic shock: Secondary | ICD-10-CM | POA: Diagnosis present

## 2024-08-28 DIAGNOSIS — E119 Type 2 diabetes mellitus without complications: Secondary | ICD-10-CM | POA: Diagnosis present

## 2024-08-28 DIAGNOSIS — A4189 Other specified sepsis: Principal | ICD-10-CM | POA: Diagnosis present

## 2024-08-28 DIAGNOSIS — R188 Other ascites: Secondary | ICD-10-CM | POA: Diagnosis present

## 2024-08-28 DIAGNOSIS — Z7902 Long term (current) use of antithrombotics/antiplatelets: Secondary | ICD-10-CM | POA: Diagnosis not present

## 2024-08-28 DIAGNOSIS — F419 Anxiety disorder, unspecified: Secondary | ICD-10-CM | POA: Diagnosis present

## 2024-08-28 DIAGNOSIS — J1001 Influenza due to other identified influenza virus with the same other identified influenza virus pneumonia: Secondary | ICD-10-CM | POA: Diagnosis present

## 2024-08-28 DIAGNOSIS — I110 Hypertensive heart disease with heart failure: Secondary | ICD-10-CM | POA: Diagnosis present

## 2024-08-28 DIAGNOSIS — J09X1 Influenza due to identified novel influenza A virus with pneumonia: Secondary | ICD-10-CM | POA: Diagnosis not present

## 2024-08-28 DIAGNOSIS — J9601 Acute respiratory failure with hypoxia: Secondary | ICD-10-CM | POA: Diagnosis present

## 2024-08-28 DIAGNOSIS — K56609 Unspecified intestinal obstruction, unspecified as to partial versus complete obstruction: Secondary | ICD-10-CM

## 2024-08-28 DIAGNOSIS — Z79899 Other long term (current) drug therapy: Secondary | ICD-10-CM

## 2024-08-28 DIAGNOSIS — Z515 Encounter for palliative care: Secondary | ICD-10-CM | POA: Diagnosis not present

## 2024-08-28 DIAGNOSIS — J690 Pneumonitis due to inhalation of food and vomit: Secondary | ICD-10-CM | POA: Diagnosis present

## 2024-08-28 DIAGNOSIS — F32A Depression, unspecified: Secondary | ICD-10-CM | POA: Diagnosis present

## 2024-08-28 DIAGNOSIS — K5669 Other partial intestinal obstruction: Secondary | ICD-10-CM | POA: Diagnosis present

## 2024-08-28 DIAGNOSIS — B9689 Other specified bacterial agents as the cause of diseases classified elsewhere: Secondary | ICD-10-CM | POA: Diagnosis present

## 2024-08-28 DIAGNOSIS — R0602 Shortness of breath: Secondary | ICD-10-CM | POA: Diagnosis present

## 2024-08-28 DIAGNOSIS — R6520 Severe sepsis without septic shock: Secondary | ICD-10-CM | POA: Diagnosis present

## 2024-08-28 DIAGNOSIS — C786 Secondary malignant neoplasm of retroperitoneum and peritoneum: Secondary | ICD-10-CM | POA: Diagnosis present

## 2024-08-28 DIAGNOSIS — R7401 Elevation of levels of liver transaminase levels: Secondary | ICD-10-CM | POA: Diagnosis present

## 2024-08-28 DIAGNOSIS — Z8551 Personal history of malignant neoplasm of bladder: Secondary | ICD-10-CM

## 2024-08-28 DIAGNOSIS — J101 Influenza due to other identified influenza virus with other respiratory manifestations: Secondary | ICD-10-CM | POA: Diagnosis present

## 2024-08-28 DIAGNOSIS — C787 Secondary malignant neoplasm of liver and intrahepatic bile duct: Secondary | ICD-10-CM | POA: Diagnosis present

## 2024-08-28 DIAGNOSIS — A419 Sepsis, unspecified organism: Secondary | ICD-10-CM

## 2024-08-28 DIAGNOSIS — E872 Acidosis, unspecified: Secondary | ICD-10-CM | POA: Diagnosis present

## 2024-08-28 DIAGNOSIS — C221 Intrahepatic bile duct carcinoma: Secondary | ICD-10-CM | POA: Diagnosis present

## 2024-08-28 DIAGNOSIS — Z96642 Presence of left artificial hip joint: Secondary | ICD-10-CM | POA: Diagnosis present

## 2024-08-28 DIAGNOSIS — Z955 Presence of coronary angioplasty implant and graft: Secondary | ICD-10-CM

## 2024-08-28 DIAGNOSIS — Z87891 Personal history of nicotine dependence: Secondary | ICD-10-CM

## 2024-08-28 DIAGNOSIS — E782 Mixed hyperlipidemia: Secondary | ICD-10-CM | POA: Diagnosis present

## 2024-08-28 DIAGNOSIS — E669 Obesity, unspecified: Secondary | ICD-10-CM | POA: Diagnosis present

## 2024-08-28 DIAGNOSIS — I25110 Atherosclerotic heart disease of native coronary artery with unstable angina pectoris: Secondary | ICD-10-CM | POA: Diagnosis not present

## 2024-08-28 DIAGNOSIS — Z7189 Other specified counseling: Secondary | ICD-10-CM | POA: Diagnosis not present

## 2024-08-28 DIAGNOSIS — Z789 Other specified health status: Secondary | ICD-10-CM | POA: Diagnosis not present

## 2024-08-28 DIAGNOSIS — Z7952 Long term (current) use of systemic steroids: Secondary | ICD-10-CM

## 2024-08-28 DIAGNOSIS — Z6834 Body mass index (BMI) 34.0-34.9, adult: Secondary | ICD-10-CM | POA: Diagnosis not present

## 2024-08-28 DIAGNOSIS — I10 Essential (primary) hypertension: Secondary | ICD-10-CM | POA: Diagnosis present

## 2024-08-28 DIAGNOSIS — J189 Pneumonia, unspecified organism: Secondary | ICD-10-CM | POA: Diagnosis not present

## 2024-08-28 DIAGNOSIS — I5033 Acute on chronic diastolic (congestive) heart failure: Secondary | ICD-10-CM | POA: Diagnosis present

## 2024-08-28 DIAGNOSIS — M1990 Unspecified osteoarthritis, unspecified site: Secondary | ICD-10-CM | POA: Diagnosis present

## 2024-08-28 DIAGNOSIS — I2510 Atherosclerotic heart disease of native coronary artery without angina pectoris: Secondary | ICD-10-CM | POA: Diagnosis present

## 2024-08-28 DIAGNOSIS — Z66 Do not resuscitate: Secondary | ICD-10-CM | POA: Diagnosis present

## 2024-08-28 DIAGNOSIS — R748 Abnormal levels of other serum enzymes: Secondary | ICD-10-CM | POA: Diagnosis present

## 2024-08-28 DIAGNOSIS — R630 Anorexia: Secondary | ICD-10-CM | POA: Diagnosis present

## 2024-08-28 DIAGNOSIS — D509 Iron deficiency anemia, unspecified: Secondary | ICD-10-CM

## 2024-08-28 DIAGNOSIS — Z1152 Encounter for screening for COVID-19: Secondary | ICD-10-CM | POA: Diagnosis not present

## 2024-08-28 DIAGNOSIS — G4733 Obstructive sleep apnea (adult) (pediatric): Secondary | ICD-10-CM | POA: Diagnosis present

## 2024-08-28 DIAGNOSIS — N400 Enlarged prostate without lower urinary tract symptoms: Secondary | ICD-10-CM | POA: Diagnosis present

## 2024-08-28 LAB — URINALYSIS, W/ REFLEX TO CULTURE (INFECTION SUSPECTED)
Bacteria, UA: NONE SEEN
Bilirubin Urine: NEGATIVE
Glucose, UA: NEGATIVE mg/dL
Hgb urine dipstick: NEGATIVE
Ketones, ur: NEGATIVE mg/dL
Leukocytes,Ua: NEGATIVE
Nitrite: NEGATIVE
Protein, ur: NEGATIVE mg/dL
Specific Gravity, Urine: 1.021 (ref 1.005–1.030)
pH: 5 (ref 5.0–8.0)

## 2024-08-28 LAB — PROTIME-INR
INR: 1.1 (ref 0.8–1.2)
Prothrombin Time: 14.6 s (ref 11.4–15.2)

## 2024-08-28 LAB — CBC WITH DIFFERENTIAL/PLATELET
Abs Immature Granulocytes: 0.05 K/uL (ref 0.00–0.07)
Abs Immature Granulocytes: 0.05 K/uL (ref 0.00–0.07)
Basophils Absolute: 0 K/uL (ref 0.0–0.1)
Basophils Absolute: 0 K/uL (ref 0.0–0.1)
Basophils Relative: 0 %
Basophils Relative: 0 %
Eosinophils Absolute: 0.1 K/uL (ref 0.0–0.5)
Eosinophils Absolute: 0 K/uL (ref 0.0–0.5)
Eosinophils Relative: 1 %
Eosinophils Relative: 0 %
HCT: 30.1 % — ABNORMAL LOW (ref 39.0–52.0)
HCT: 34.4 % — ABNORMAL LOW (ref 39.0–52.0)
Hemoglobin: 9.4 g/dL — ABNORMAL LOW (ref 13.0–17.0)
Hemoglobin: 10.8 g/dL — ABNORMAL LOW (ref 13.0–17.0)
Immature Granulocytes: 1 %
Immature Granulocytes: 1 %
Lymphocytes Relative: 3 %
Lymphocytes Relative: 4 %
Lymphs Abs: 0.3 K/uL — ABNORMAL LOW (ref 0.7–4.0)
Lymphs Abs: 0.3 K/uL — ABNORMAL LOW (ref 0.7–4.0)
MCH: 31.1 pg (ref 26.0–34.0)
MCH: 31.4 pg (ref 26.0–34.0)
MCHC: 31.2 g/dL (ref 30.0–36.0)
MCHC: 31.4 g/dL (ref 30.0–36.0)
MCV: 99.7 fL (ref 80.0–100.0)
MCV: 100 fL (ref 80.0–100.0)
Monocytes Absolute: 1.2 K/uL — ABNORMAL HIGH (ref 0.1–1.0)
Monocytes Absolute: 0.8 K/uL (ref 0.1–1.0)
Monocytes Relative: 11 %
Monocytes Relative: 8 %
Neutro Abs: 8.6 K/uL — ABNORMAL HIGH (ref 1.7–7.7)
Neutro Abs: 8.5 K/uL — ABNORMAL HIGH (ref 1.7–7.7)
Neutrophils Relative %: 84 %
Neutrophils Relative %: 87 %
Platelets: 235 K/uL (ref 150–400)
Platelets: 272 K/uL (ref 150–400)
RBC: 3.02 MIL/uL — ABNORMAL LOW (ref 4.22–5.81)
RBC: 3.44 MIL/uL — ABNORMAL LOW (ref 4.22–5.81)
RDW: 20.9 % — ABNORMAL HIGH (ref 11.5–15.5)
RDW: 21.2 % — ABNORMAL HIGH (ref 11.5–15.5)
Smear Review: NORMAL
WBC: 10.2 K/uL (ref 4.0–10.5)
WBC: 9.8 K/uL (ref 4.0–10.5)
nRBC: 0 % (ref 0.0–0.2)
nRBC: 0 % (ref 0.0–0.2)

## 2024-08-28 LAB — FERRITIN: Ferritin: 997 ng/mL — ABNORMAL HIGH (ref 24–336)

## 2024-08-28 LAB — COMPREHENSIVE METABOLIC PANEL WITH GFR
ALT: 33 U/L (ref 0–44)
ALT: 38 U/L (ref 0–44)
AST: 126 U/L — ABNORMAL HIGH (ref 15–41)
AST: 144 U/L — ABNORMAL HIGH (ref 15–41)
Albumin: 2.7 g/dL — ABNORMAL LOW (ref 3.5–5.0)
Albumin: 3.1 g/dL — ABNORMAL LOW (ref 3.5–5.0)
Alkaline Phosphatase: 147 U/L — ABNORMAL HIGH (ref 38–126)
Alkaline Phosphatase: 173 U/L — ABNORMAL HIGH (ref 38–126)
Anion gap: 18 — ABNORMAL HIGH (ref 5–15)
Anion gap: 18 — ABNORMAL HIGH (ref 5–15)
BUN: 14 mg/dL (ref 8–23)
BUN: 14 mg/dL (ref 8–23)
CO2: 16 mmol/L — ABNORMAL LOW (ref 22–32)
CO2: 18 mmol/L — ABNORMAL LOW (ref 22–32)
Calcium: 8.3 mg/dL — ABNORMAL LOW (ref 8.9–10.3)
Calcium: 8.8 mg/dL — ABNORMAL LOW (ref 8.9–10.3)
Chloride: 101 mmol/L (ref 98–111)
Chloride: 99 mmol/L (ref 98–111)
Creatinine, Ser: 1.59 mg/dL — ABNORMAL HIGH (ref 0.61–1.24)
Creatinine, Ser: 1.53 mg/dL — ABNORMAL HIGH (ref 0.61–1.24)
GFR, Estimated: 47 mL/min — ABNORMAL LOW (ref >60)
GFR, Estimated: 49 mL/min — ABNORMAL LOW (ref >60)
Glucose, Bld: 127 mg/dL — ABNORMAL HIGH (ref 70–99)
Glucose, Bld: 92 mg/dL (ref 70–99)
Potassium: 3.8 mmol/L (ref 3.5–5.1)
Potassium: 3.7 mmol/L (ref 3.5–5.1)
Sodium: 135 mmol/L (ref 135–145)
Sodium: 135 mmol/L (ref 135–145)
Total Bilirubin: 0.3 mg/dL (ref 0.0–1.2)
Total Bilirubin: 0.4 mg/dL (ref 0.0–1.2)
Total Protein: 5.5 g/dL — ABNORMAL LOW (ref 6.5–8.1)
Total Protein: 6.3 g/dL — ABNORMAL LOW (ref 6.5–8.1)

## 2024-08-28 LAB — RESP PANEL BY RT-PCR (RSV, FLU A&B, COVID)  RVPGX2
Influenza A by PCR: POSITIVE — AB
Influenza B by PCR: NEGATIVE
Resp Syncytial Virus by PCR: NEGATIVE
SARS Coronavirus 2 by RT PCR: NEGATIVE

## 2024-08-28 LAB — IRON AND TIBC
Iron: 22 ug/dL — ABNORMAL LOW (ref 45–182)
Saturation Ratios: 11 % — ABNORMAL LOW (ref 17.9–39.5)
TIBC: 204 ug/dL — ABNORMAL LOW (ref 250–450)
UIBC: 182 ug/dL

## 2024-08-28 LAB — SAMPLE TO BLOOD BANK

## 2024-08-28 LAB — LACTIC ACID, PLASMA
Lactic Acid, Venous: 4.1 mmol/L (ref 0.5–1.9)
Lactic Acid, Venous: 2.5 mmol/L (ref 0.5–1.9)

## 2024-08-28 LAB — PRO BRAIN NATRIURETIC PEPTIDE: Pro Brain Natriuretic Peptide: 1450 pg/mL — ABNORMAL HIGH (ref <300.0)

## 2024-08-28 LAB — GLUCOSE, CAPILLARY: Glucose-Capillary: 106 mg/dL — ABNORMAL HIGH (ref 70–99)

## 2024-08-28 LAB — VITAMIN B12: Vitamin B-12: 1674 pg/mL — ABNORMAL HIGH (ref 180–914)

## 2024-08-28 LAB — FOLATE: Folate: 20 ng/mL (ref >5.9)

## 2024-08-28 LAB — D-DIMER, QUANTITATIVE: D-Dimer, Quant: 10.97 ug{FEU}/mL — ABNORMAL HIGH (ref 0.00–0.50)

## 2024-08-28 MED ORDER — IOHEXOL 350 MG/ML SOLN
75.0000 mL | Freq: Once | INTRAVENOUS | Status: AC | PRN
  Administered 2024-08-28: 18:00:00 75 mL via INTRAVENOUS

## 2024-08-28 MED ORDER — DIPHENOXYLATE-ATROPINE 2.5-0.025 MG PO TABS: 1.0000 | ORAL_TABLET | Freq: Four times a day (QID) | ORAL | Status: DC | PRN

## 2024-08-28 MED ORDER — HEPARIN SODIUM (PORCINE) 5000 UNIT/ML IJ SOLN
5000.0000 [IU] | Freq: Three times a day (TID) | INTRAMUSCULAR | Status: DC
  Administered 2024-08-29 – 2024-09-05 (×24): 5000 [IU] via SUBCUTANEOUS
  Filled 2024-08-28 (×25): qty 1

## 2024-08-28 MED ORDER — OSELTAMIVIR PHOSPHATE 75 MG PO CAPS
75.0000 mg | ORAL_CAPSULE | Freq: Two times a day (BID) | ORAL | Status: AC
  Administered 2024-08-28 – 2024-09-02 (×10): 75 mg via ORAL
  Filled 2024-08-28 (×10): qty 1

## 2024-08-28 MED ORDER — CEFTRIAXONE SODIUM 2 G IJ SOLR
2.0000 g | Freq: Once | INTRAMUSCULAR | Status: AC
  Administered 2024-08-28: 14:00:00 2 g via INTRAVENOUS
  Filled 2024-08-28: qty 20

## 2024-08-28 MED ORDER — TICAGRELOR 90 MG PO TABS
90.0000 mg | ORAL_TABLET | Freq: Two times a day (BID) | ORAL | Status: DC
  Administered 2024-08-29 – 2024-09-06 (×11): 90 mg via ORAL
  Filled 2024-08-28 (×16): qty 1

## 2024-08-28 MED ORDER — PRAMIPEXOLE DIHYDROCHLORIDE 0.25 MG PO TABS
0.7500 mg | ORAL_TABLET | Freq: Three times a day (TID) | ORAL | Status: DC
  Administered 2024-08-28 – 2024-09-05 (×24): 0.75 mg via ORAL
  Filled 2024-08-28 (×28): qty 3

## 2024-08-28 MED ORDER — SODIUM CHLORIDE 0.9 % IV SOLN
500.0000 mg | Freq: Once | INTRAVENOUS | Status: AC
  Administered 2024-08-28: 19:00:00 500 mg via INTRAVENOUS
  Filled 2024-08-28: qty 5

## 2024-08-28 MED ORDER — SODIUM CHLORIDE 0.9 % IV BOLUS
1000.0000 mL | Freq: Once | INTRAVENOUS | Status: AC
  Administered 2024-08-28: 13:00:00 1000 mL via INTRAVENOUS

## 2024-08-28 MED ORDER — CHLORPROMAZINE HCL 25 MG PO TABS
25.0000 mg | ORAL_TABLET | Freq: Three times a day (TID) | ORAL | Status: DC | PRN
  Administered 2024-09-01 – 2024-09-03 (×3): 25 mg via ORAL
  Filled 2024-08-28 (×3): qty 1

## 2024-08-28 MED ORDER — VANCOMYCIN HCL 500 MG/100ML IV SOLN
500.0000 mg | Freq: Once | INTRAVENOUS | Status: AC
  Administered 2024-08-28: 23:00:00 500 mg via INTRAVENOUS
  Filled 2024-08-28: qty 100

## 2024-08-28 MED ORDER — CHLORHEXIDINE GLUCONATE CLOTH 2 % EX PADS
6.0000 | MEDICATED_PAD | Freq: Every day | CUTANEOUS | Status: DC
  Administered 2024-08-28 – 2024-08-31 (×3): 6 via TOPICAL

## 2024-08-28 MED ORDER — ZOLPIDEM TARTRATE 5 MG PO TABS
5.0000 mg | ORAL_TABLET | Freq: Every evening | ORAL | Status: DC | PRN
  Administered 2024-08-28 – 2024-08-30 (×3): 5 mg via ORAL
  Filled 2024-08-28 (×3): qty 1

## 2024-08-28 MED ORDER — PANTOPRAZOLE SODIUM 40 MG PO TBEC
40.0000 mg | DELAYED_RELEASE_TABLET | Freq: Every day | ORAL | Status: DC
  Administered 2024-08-29 – 2024-09-05 (×8): 40 mg via ORAL
  Filled 2024-08-28 (×9): qty 1

## 2024-08-28 MED ORDER — SODIUM CHLORIDE 0.9 % IV SOLN
2.0000 g | INTRAVENOUS | Status: AC
  Administered 2024-08-29 – 2024-09-01 (×4): 2 g via INTRAVENOUS
  Filled 2024-08-28 (×4): qty 20

## 2024-08-28 MED ORDER — MIDODRINE HCL 5 MG PO TABS
5.0000 mg | ORAL_TABLET | Freq: Three times a day (TID) | ORAL | Status: DC
  Administered 2024-08-28 – 2024-09-06 (×21): 5 mg via ORAL
  Filled 2024-08-28 (×22): qty 1

## 2024-08-28 MED ORDER — BUPROPION HCL ER (XL) 300 MG PO TB24
300.0000 mg | ORAL_TABLET | Freq: Every day | ORAL | Status: DC
  Administered 2024-08-29 – 2024-09-06 (×9): 300 mg via ORAL
  Filled 2024-08-28: qty 2
  Filled 2024-08-28 (×2): qty 1
  Filled 2024-08-28 (×3): qty 2
  Filled 2024-08-28 (×4): qty 1

## 2024-08-28 MED ORDER — SODIUM CHLORIDE 0.9 % IV SOLN
500.0000 mg | INTRAVENOUS | Status: AC
  Administered 2024-08-29 – 2024-09-01 (×4): 500 mg via INTRAVENOUS
  Filled 2024-08-28 (×4): qty 5

## 2024-08-28 MED ORDER — TAMSULOSIN HCL 0.4 MG PO CAPS
0.4000 mg | ORAL_CAPSULE | Freq: Every day | ORAL | Status: DC
  Administered 2024-08-28 – 2024-09-06 (×10): 0.4 mg via ORAL
  Filled 2024-08-28 (×10): qty 1

## 2024-08-28 MED ORDER — VANCOMYCIN HCL 2000 MG/400ML IV SOLN
2000.0000 mg | INTRAVENOUS | Status: DC
  Administered 2024-08-28: 21:00:00 2000 mg via INTRAVENOUS
  Filled 2024-08-28: qty 400

## 2024-08-28 MED ORDER — LACTATED RINGERS IV BOLUS
1000.0000 mL | Freq: Once | INTRAVENOUS | Status: AC
  Administered 2024-08-28: 19:00:00 1000 mL via INTRAVENOUS

## 2024-08-28 MED ORDER — SODIUM CHLORIDE 0.9 % IV BOLUS
1000.0000 mL | Freq: Once | INTRAVENOUS | Status: AC
  Administered 2024-08-28: 17:00:00 1000 mL via INTRAVENOUS

## 2024-08-28 MED ORDER — HYDRALAZINE HCL 20 MG/ML IJ SOLN: 5.0000 mg | INTRAMUSCULAR | Status: DC | PRN

## 2024-08-28 NOTE — Progress Notes (Signed)
 Elink is following code sepsis.

## 2024-08-28 NOTE — ED Notes (Signed)
 Asked pt if he was able to void and he said no, gave him a urinal and he stated he would call us  on the call light once he is able to supply a urine sample.

## 2024-08-28 NOTE — Progress Notes (Signed)
°   08/28/24 1300  Spiritual Encounters  Type of Visit Follow up  Care provided to: Patient  Referral source Clinical staff  Reason for visit Routine spiritual support  OnCall Visit No   Chaplain attempted to check in with Pt as his Lab draw and was told that he was taken to ED.  Chaplain visited the Pt in the ED for routine emotional and spiritual support.  Chaplain needed to step out as medical team provided care.  Will return later  Spiritual Care remains available upon request.  Maude Roll, MDiv Chaplain, [facility] [email or web address] [phone] 08/28/2024 1:30 PM

## 2024-08-28 NOTE — Consult Note (Signed)
 Pharmacy Antibiotic Note  Marvin Carr is a 68 y.o. male admitted on 08/28/2024 with pneumonia.  Pharmacy has been consulted for vancomycin  dosing.  Plan: Start vancomycin  2000 mg IV every 24 hours Estimated AUC 522.9, Cmin 13.1 IBW, Scr 1.53, Vd 0.72 Check vancomycin  levels at steady state or as clinically indicated Follow renal function and cultures for adjustments  Height: 6' (182.9 cm) Weight: 115.7 kg (255 lb) IBW/kg (Calculated) : 77.6  Temp (24hrs), Avg:99.3 F (37.4 C), Min:98.5 F (36.9 C), Max:100.3 F (37.9 C)  Recent Labs  Lab 08/22/24 0022 08/23/24 0326 08/28/24 1211 08/28/24 1334 08/28/24 1458  WBC 8.1 7.1 10.2 9.8  --   CREATININE 1.03  --  1.59* 1.53*  --   LATICACIDVEN  --   --   --  4.1* 2.5*    Estimated Creatinine Clearance: 59.8 mL/min (A) (by C-G formula based on SCr of 1.53 mg/dL (H)).    Allergies[1]  Antimicrobials this admission: vancomycin  12/18 >>  ceftriaxone  12/18 >>  Azithromycin  12/18>>  Dose adjustments this admission: N/A  Microbiology results: 12/18 BCx: pending 12/18 MRSA PCR: ordered  Thank you for allowing pharmacy to be a part of this patients care.  Marvin Carr, PharmD 08/28/2024 8:07 PM     [1] No Known Allergies

## 2024-08-28 NOTE — ED Notes (Signed)
Pt up to chair for comfort.

## 2024-08-28 NOTE — ED Triage Notes (Signed)
 Patient seen in the cancer center today brought to ED for Paleness and shortness of breath. States the sob has been going on for months. Temp with cancer center 100.3 tempanic, pulse 122, 98% on room air.

## 2024-08-28 NOTE — Progress Notes (Signed)
 Patient presented to the clinic for labs today.  Tachycardic @ 122, RR 28, temp 100.3 tympanic, BP 117/68.  Very SOB. Abdomen obtunded.  Skin pale and mucosa extremely dry.  Last chemo 12/3.  Denies c/o pain at this time.  Fell at home yesterday and was down for over an hour before he could get up.  Denies injury.  Decided to transfer to the ER for evaluation post lab draw.  Report called to Warren Jubilee, RN.  Patient transported via wheelchair per myself to ER.

## 2024-08-28 NOTE — H&P (Signed)
 [facility] H&P   Patient Demographics:    Marvin Carr, is a 68 y.o. male  MRN: [id number]   DOB - [birth year]  Admit Date - 08/28/2024  Outpatient Primary MD for the patient is [name], MD    Chief Complaint  Patient presents with   Shortness of Breath      HPI:    Marvin Carr  is a 68 y.o. 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 - She presents to ED today secondary to dyspnea, lysed weakness, fatigue, he denies chest pain, reports orthopnea, cough, nonproductive, fever and chills at home, he has diarrhea which is chronic, forts weakness during ambulation yesterday where he felt his legs gave up or he had a fall, had oncology follow-up appointment today where he was noted to be febrile 100.3, tachypneic and tachycardic so he was brought to ED for further evaluation. -In ED workup significant for Lones AF pneumonia, CTA chest negative for PE but significant for multifocal pneumonia, labs were significant for Creatinine, elevated lactic acid at 4.1, improved with IV fluids, his respiratory panel positive for influenza A, negative UA, [facility]ist consulted to admit.      Review of systems:     A full 10 point Review of Systems was done, except as stated above, all other Review of Systems were negative.   With Past History of the  following :    Past Medical History:  Diagnosis Date   Anxiety    Arthritis    Bladder cancer (HCC) 09/11/2009   CAD (coronary artery disease), native coronary artery    a. Mildly elevated troponin 03/2013, cath with nonobstructive disease including 50% AV groove distal stenosis before large OM   Depression    Essential hypertension    Headache(784.0)    History of migraines   Hyperglycemia    Metastatic cancer to liver [facility])    Mixed hyperlipidemia    Neuropathy    Obesity    Port-A-Cath in place 09/30/2021   Pre-diabetes    Seasonal allergies    Sleep apnea    On CPAP      Past Surgical History:  Procedure Laterality Date   BIOPSY  09/23/2021   Procedure: BIOPSY;  Surgeon: Eartha Angelia Sieving, MD;  [facility]: [facility];  Service: Gastroenterology;;   COLONOSCOPY  06/28/2011   Procedure: COLONOSCOPY;  Surgeon: Claudis RAYMOND Rivet, MD;  Location: [facility];  Service:  Endoscopy;  Laterality: N/A;   COLONOSCOPY WITH PROPOFOL  N/A 09/23/2021   Procedure: COLONOSCOPY WITH PROPOFOL ;  Surgeon: Eartha Angelia Sieving, MD;  Location: [facility];  Service: Gastroenterology;  Laterality: N/A;  940   CORONARY STENT INTERVENTION N/A 07/04/2024   Procedure: CORONARY STENT INTERVENTION;  Surgeon: Wendel Lurena POUR, MD;  Location: [facility];  Service: Cardiovascular;  Laterality: N/A;   ESOPHAGOGASTRODUODENOSCOPY (EGD) WITH PROPOFOL  N/A 09/23/2021   Procedure: ESOPHAGOGASTRODUODENOSCOPY (EGD) WITH PROPOFOL ;  Surgeon: Eartha Angelia Sieving, MD;  Location: [facility];  Service: Gastroenterology;  Laterality: N/A;   IR IMAGING GUIDED PORT INSERTION  09/28/2021   IR PARACENTESIS  09/28/2021   JOINT REPLACEMENT Right    hip   LEFT HEART CATH AND CORONARY ANGIOGRAPHY N/A 07/04/2024   Procedure: LEFT HEART CATH AND CORONARY ANGIOGRAPHY;  Surgeon: Wendel Lurena POUR, MD;  Location: [facility];  Service: Cardiovascular;  Laterality: N/A;   LEFT HEART CATHETERIZATION  WITH CORONARY ANGIOGRAM N/A 03/31/2013   Procedure: LEFT HEART CATHETERIZATION WITH CORONARY ANGIOGRAM;  Surgeon: Lynwood Schilling, MD;  Location: [facility] CATH LAB;  Service: Cardiovascular;  Laterality: N/A;   POLYPECTOMY  09/23/2021   Procedure: POLYPECTOMY;  Surgeon: Eartha Angelia Sieving, MD;  Location: [facility];  Service: Gastroenterology;;   TOTAL HIP ARTHROPLASTY Left 01/15/2024   Procedure: ARTHROPLASTY, HIP, TOTAL, ANTERIOR APPROACH;  Surgeon: Ernie Cough, MD;  Location: [facility];  Service: Orthopedics;  Laterality: Left;   TURBT  09/11/2009      Social History:     Social History   Tobacco Use   Smoking status: Former    Current packs/day: 0.00    Average packs/day: 0.5 packs/day for 10.0 years (5.0 ttl pk-yrs)    Types: Cigarettes    Start date: 07/09/2001    Quit date: 07/10/2011    Years since quitting: 13.1    Passive exposure: Past   Smokeless tobacco: Never   Tobacco comments:    Quit several yrs prior to 03/2013.  Substance Use Topics   Alcohol use: Yes    Alcohol/week: 0.0 standard drinks of alcohol    Comment: Occasional       Family History :     Family History  Problem Relation Age of Onset   Cancer Father        MDS   COPD Father    Cancer Paternal Grandfather        prostate cancer     Home Medications:   Prior to Admission medications  Medication Sig Start Date End Date Taking? Authorizing Provider  acetaminophen  (TYLENOL ) 500 MG tablet Take 1,000 mg by mouth every 8 (eight) hours as needed for mild pain (pain score 1-3) or moderate pain (pain score 4-6).    [provider]  atorvastatin  (LIPITOR) 20 MG tablet Take 1 tablet (20 mg total) by mouth daily. 07/11/24 10/09/24  Strader, Laymon HERO, PA-C  benzonatate  (TESSALON ) 200 MG capsule Take 1 capsule (200 mg total) by mouth 3 (three) times daily as needed for cough. 07/14/24   Geofm Delon BRAVO, NP  [Paused] bumetanide  (BUMEX ) 2 MG tablet Take 1 tablet (2 mg total) by mouth daily as  needed. Patient taking differently: Take 2 mg by mouth daily. Wait to take this until your doctor or other care provider tells you to start again. 05/21/24   Strader, Laymon HERO, PA-C  buPROPion  (WELLBUTRIN  XL) 300 MG 24 hr tablet Take 300 mg by mouth daily. 09/17/23   [provider]  cetirizine  (  ZYRTEC ) 10 MG tablet Take 10 mg by mouth daily.    [provider]  chlorproMAZINE  (THORAZINE ) 25 MG tablet Take 1 tablet (25 mg total) by mouth 3 (three) times daily as needed for hiccoughs. 07/28/24   [name] Callander, MD  cyclobenzaprine  (FLEXERIL ) 10 MG tablet Take 1 tablet (10 mg total) by mouth every 6 (six) hours as needed for muscle spasms. 07/15/24   Geofm Delon BRAVO, NP  dexamethasone  (DECADRON ) 4 MG tablet Take 2 tablets (8 mg total) by mouth daily. Start the day after chemotherapy for 2 days. Take with food. 08/04/24   Kandala, Hyndavi, MD  diphenoxylate -atropine  (LOMOTIL ) 2.5-0.025 MG tablet Take 1 tablet by mouth 4 (four) times daily as needed for diarrhea or loose stools. 08/13/24   Kandala, Hyndavi, MD  docusate sodium  (COLACE) 100 MG capsule Take 200 mg by mouth as needed for mild constipation.    [provider]  Eszopiclone 3 MG TABS Take 3 mg by mouth at bedtime.    [provider]  famotidine  (PEPCID ) 20 MG tablet Take 20 mg by mouth at bedtime. 09/17/23   [provider]  fenofibrate  160 MG tablet Take 160 mg by mouth daily. 11/05/19   [provider]  furosemide  (LASIX ) 20 MG tablet Take 1 tablet (20 mg total) by mouth 3 (three) times a week. Monday, Wednesday and Friday 08/04/24 11/02/24  Zenaida Morene PARAS, MD  lidocaine  (LIDODERM ) 5 % Place 1 patch onto the skin as needed (for pain). Remove & Discard patch within 12 hours or as directed by MD    [provider]  loperamide  (IMODIUM ) 2 MG capsule Take 1 capsule (2 mg total) by mouth as needed for diarrhea or loose stools. Take 2 capsules after the first watery stool, and then one  capsule after each subsequent watery stool. Do not exceed 8 capsules in 24 hours 08/12/24   Kandala, Hyndavi, MD  magic mouthwash (lidocaine , diphenhydrAMINE , alum & mag hydroxide) suspension Swish and swallow 5 mLs 3 (three) times daily as needed for mouth pain. 07/15/24   Geofm Delon BRAVO, NP  Magnesium  Oxide -Mg Supplement 200 MG TABS Take 200 mg by mouth daily.    [provider]  metoCLOPramide  (REGLAN ) 10 MG tablet Take 1 tablet (10 mg total) by mouth every 8 (eight) hours as needed (Hiccups). 07/28/24   [name] Callander, MD  NIFEdipine  (PROCARDIA -XL/NIFEDICAL-XL) 30 MG 24 hr tablet Take 1 tablet (30 mg total) by mouth daily. 08/04/24   Zenaida Morene PARAS, MD  nitroGLYCERIN  (NITROSTAT ) 0.4 MG SL tablet Place 0.4 mg under the tongue every 5 (five) minutes x 3 doses as needed for chest pain (if no relief after 2nd dose, proceed to ED or call 911).    [provider]  omeprazole (PRILOSEC) 40 MG capsule Take 40 mg by mouth daily.    [provider]  ondansetron  (ZOFRAN ) 8 MG tablet Take 1 tablet (8 mg total) by mouth every 8 (eight) hours as needed for nausea or vomiting. Start on the third day after chemotherapy. 07/28/24   Davonna Siad, MD  pantoprazole  (PROTONIX ) 40 MG tablet Take 1 tablet (40 mg total) by mouth daily. Patient not taking: Reported on 08/16/2024 07/17/24 08/25/24  Ezenduka, Nkeiruka J, MD  pramipexole  (MIRAPEX ) 0.75 MG tablet Take 1 tablet (0.75 mg total) by mouth 3 (three) times daily. 08/30/22   Rogers Hai, MD  prochlorperazine  (COMPAZINE ) 10 MG tablet Take 1 tablet (10 mg total) by mouth every 6 (six) hours as needed for  nausea or vomiting. 07/28/24   Davonna Siad, MD  tamsulosin  (FLOMAX ) 0.4 MG CAPS capsule Take 0.4 mg by mouth daily. 08/29/21   [provider]  ticagrelor  (BRILINTA ) 90 MG TABS tablet Take 1 tablet (90 mg total) by mouth 2 (two) times daily. 07/07/24   Al-Sultani, Anmar, MD  traMADol  (ULTRAM ) 50 MG tablet Take 50 mg  by mouth at bedtime as needed for severe pain (pain score 7-10).    [provider]  traZODone  (DESYREL ) 100 MG tablet Take 0.5 tablets (50 mg total) by mouth at bedtime as needed for sleep. 08/26/24   Kandala, Hyndavi, MD  triamcinolone (KENALOG) 0.1 % paste Use as directed 1 Application in the mouth or throat daily as needed (for mouth ulcers). 07/11/24   [provider]     Allergies:    Allergies[1]   Physical Exam:   Vitals  Blood pressure 107/69, pulse (!) 113, temperature 97.9 F (36.6 C), temperature source Oral, resp. rate 20, height 6' (1.829 m), weight 115.7 kg, SpO2 96%.   1. General Frail elderly male, laying in bed, no apparent distress  2. Normal affect and insight, Not Suicidal or Homicidal, Awake Alert, Oriented X 3.  3. No F.N deficits, ALL C.Nerves Intact, Strength 5/5 all 4 extremities, Sensation intact all 4 extremities, Plantars down going.  4. Ears and Eyes appear Normal, Conjunctivae clear, PERRLA. Moist Oral Mucosa.  5. Supple Neck,  No Carotid Bruits.  6. Symmetrical Chest wall movement, Minister entry at the bases with scattered rhonchi  7.  Tachycardic, No Gallops, Rubs or Murmurs, No Parasternal Heave.  8. Positive Bowel Sounds, Abdomen mildly distended,  No tenderness, No organomegaly appriciated,No rebound -guarding or rigidity.  9.  No Cyanosis, Normal Skin Turgor, No Skin Rash or Bruise.  10. Good muscle tone,  joints appear normal , no effusions, Normal ROM.    Data Review:    CBC Recent Labs  Lab 08/22/24 0022 08/23/24 0326 08/28/24 1211 08/28/24 1334  WBC 8.1 7.1 10.2 9.8  HGB 8.6* 8.9* 9.4* 10.8*  HCT 27.5* 28.3* 30.1* 34.4*  PLT 180 169 235 272  MCV 97.9 97.6 99.7 100.0  MCH 30.6 30.7 31.1 31.4  MCHC 31.3 31.4 31.2 31.4  RDW 19.4* 20.2* 20.9* 21.2*  LYMPHSABS  --   --  0.3* 0.3*  MONOABS  --   --  1.2* 0.8  EOSABS  --   --  0.1 0.0  BASOSABS  --   --  0.0 0.0    ------------------------------------------------------------------------------------------------------------------  Chemistries  Recent Labs  Lab 08/22/24 0022 08/28/24 1211 08/28/24 1334  NA 137 135 135  K 3.0* 3.8 3.7  CL 104 101 99  CO2 22 16* 18*  GLUCOSE 92 127* 92  BUN 7* 14 14  CREATININE 1.03 1.59* 1.53*  CALCIUM  8.0* 8.3* 8.8*  MG 1.7  --   --   AST  --  126* 144*  ALT  --  33 38  ALKPHOS  --  147* 173*  BILITOT  --  0.3 0.4   ------------------------------------------------------------------------------------------------------------------ estimated creatinine clearance is 59.8 mL/min (A) (by C-G formula based on SCr of 1.53 mg/dL (H)). ------------------------------------------------------------------------------------------------------------------ No results for input(s): TSH, T4TOTAL, T3FREE, THYROIDAB in the last 72 hours.  Invalid input(s): FREET3  Coagulation profile Recent Labs  Lab 08/28/24 1334  INR 1.1   ------------------------------------------------------------------------------------------------------------------- Recent Labs    08/28/24 1334  DDIMER 10.97*   -------------------------------------------------------------------------------------------------------------------  Cardiac Enzymes No results for input(s): CKMB, TROPONINI, MYOGLOBIN in the  last 168 hours.  Invalid input(s): CK ------------------------------------------------------------------------------------------------------------------    Component Value Date/Time   BNP 115.0 (H) 05/15/2024 1758     ---------------------------------------------------------------------------------------------------------------  Urinalysis    Component Value Date/Time   COLORURINE YELLOW 08/28/2024 1805   APPEARANCEUR CLEAR 08/28/2024 1805   LABSPEC 1.021 08/28/2024 1805   PHURINE 5.0 08/28/2024 1805   GLUCOSEU NEGATIVE 08/28/2024 1805   HGBUR NEGATIVE 08/28/2024  1805   BILIRUBINUR NEGATIVE 08/28/2024 1805   KETONESUR NEGATIVE 08/28/2024 1805   PROTEINUR NEGATIVE 08/28/2024 1805   NITRITE NEGATIVE 08/28/2024 1805   LEUKOCYTESUR NEGATIVE 08/28/2024 1805    ----------------------------------------------------------------------------------------------------------------   Imaging Results:    CT Angio Chest PE W and/or Wo Contrast Result Date: 08/28/2024 CLINICAL DATA:  Concern for pulmonary embolism. EXAM: CT ANGIOGRAPHY CHEST WITH CONTRAST TECHNIQUE: Multidetector CT imaging of the chest was performed using the standard protocol during bolus administration of intravenous contrast. Multiplanar CT image reconstructions and MIPs were obtained to evaluate the vascular anatomy. RADIATION DOSE REDUCTION: This exam was performed according to the departmental dose-optimization program which includes automated exposure control, adjustment of the mA and/or kV according to patient size and/or use of iterative reconstruction technique. CONTRAST:  75mL OMNIPAQUE  IOHEXOL  350 MG/ML SOLN COMPARISON:  Chest radiograph dated 08/28/2024. FINDINGS: Cardiovascular: There is no cardiomegaly or pericardial effusion. There is 3 vessel coronary vascular calcification. Mild atherosclerotic calcification of the thoracic aorta. No aneurysmal dilatation or dissection. The origins of the great vessels of the aortic arch appear patent. Right-sided Port-A-Cath with tip over central SVC. No pulmonary artery embolus identified. Mediastinum/Nodes: No hilar or mediastinal adenopathy. The esophagus is grossly unremarkable no mediastinal fluid collection. Lungs/Pleura: Small left pleural effusion with partial compressive atelectasis of the left lower lobe. Patchy area of streaky and nodular density in the left upper lobe and lingula consistent with pneumonia. Aspiration is not excluded. Additional reticulonodular density at the right lung base may represent atelectasis or infiltrate. No pneumothorax.  The central airways are patent. Upper Abdomen: Partially visualized ascites Musculoskeletal: Degenerative changes of the spine. No acute osseous pathology. Review of the MIP images confirms the above findings. IMPRESSION: 1. No CT evidence of pulmonary artery embolus. 2. Left upper lobe and lingular pneumonia. Aspiration is not excluded. 3. Small left pleural effusion with partial compressive atelectasis of the left lower lobe. 4. Partially visualized ascites. 5.  Aortic Atherosclerosis (ICD10-I70.0). Electronically Signed   By: Vanetta Chou M.D.   On: 08/28/2024 18:17   DG Chest Port 1 View Result Date: 08/28/2024 CLINICAL DATA:  Questionable sepsis - evaluate for abnormality EXAM: PORTABLE CHEST - 1 VIEW COMPARISON:  07/16/2024 FINDINGS: Right chest port is similarly positioned terminating in the lower SVC. Unchanged trace left pleural effusion. Streaky left basilar atelectasis. No pneumothorax or new airspace consolidation. The cardiac silhouette is at the upper limits of normal, likely accentuated by AP technique. Aortic atherosclerosis. No acute fracture or destructive lesions. Multilevel thoracic osteophytosis. IMPRESSION: Unchanged trace left pleural effusion with streaky left basilar atelectasis. Electronically Signed   By: Rogelia Myers M.D.   On: 08/28/2024 13:35       Assessment & Plan:    Principal Problem:   Sepsis (HCC) Active Problems:   CAD (coronary artery disease), native coronary artery   Essential hypertension   Cholangiocarcinoma metastatic to liver (HCC)   Sepsis Influenza A pneumonia with possible superimposed post bacterial pneumonia -Sepsis POA, due to influenza A infection, possible viral pneumonia and bacterial pneumonia-. -Given recent hospitalization will cover for HCAP, continue with  azithromycin , Rocephin  and vancomycin  (MRSA pneumonia is common with influenza A infection. - Continue with Tamiflu  - Follow-up blood cultures -Blood pressure remains soft,  will start on midodrine    Metastatic cholangiocarcinoma  Peritoneal carcinomatosis and metastatic disease to the liver and bile duct.  Diagnosed in 2022. -Patient following with oncology, but no recent chemotherapy due to multiple recent hospitalizations.   H/o CAD s/p Lcx stent 10/25 HLD -Continue with Brilinta  , (aspirin  was discontinued during recent hospitalization) per cardiology recommendation during recent hospitalization. -Continue Lipitor and fenofibrate  as before  Chronic diastolic CHF HTN Echo from 06/2024 with EF of 60 to 65%, grade 1 diastolic dysfunction noted.   -Blood pressure is soft, hold home Lasix .  Toprol  and Procardia  - Resume metoprolol  if blood pressure stabilizes - Will start on low-dose midodrine    Type 2 diabetes mellitus A1c 5.7 on 06/2024 -Farxiga  and metformin  discontinued during recent discharge -Continue to monitor CBG and if elevated then start insulin  sliding scale   Anxiety/depression Continue Wellbutrin  300 mg daily   BPH Flomax    Obesity 1 Body mass index is 34.58 kg/m.   DVT Prophylaxis Heparin    AM Labs Ordered, also please review Full Orders  Family Communication: Admission, patients condition and plan of care including tests being ordered have been discussed with the patient  who indicate understanding and agree with the plan and Code Status.  Code Status DNR/DNI confirmed by patient  Likely DC to home  Consults called: None  Admission status: Inpatient  Time spent in minutes : 70 minutes   Brayton Lye M.D on 08/28/2024 at 9:18 PM   [facility] - Office  [phone]        [1] No Known Allergies

## 2024-08-28 NOTE — ED Provider Notes (Cosign Needed)
 [place] EMERGENCY DEPARTMENT AT [facility] Provider Note   CSN: [id number] Arrival date & time: 08/28/24  1243     Patient presents with: Shortness of Breath   Marvin Carr is a 68 y.o. male with a history significant for CAD, hypertension, currently under the care of oncology secondary to metastatic cholangiocarcinoma and peritoneal carcinomatosis presenting for evaluation of shortness of breath which she states has been present for a long time.  He denies chest pain, he he endorses orthopnea, has had a mostly nonproductive cough but several days ago coughed up a hard round object which he presents with which appears to possibly be a discolored small round shaped pill.  He denies fevers or chills, chest pain  or URI symptoms.  He also denies nausea or abdominal pain but has had diarrhea, nonbloody for the past 2 days, reporting 5 episodes each day, treated with Imodium .  He does endorse increasing generalized weakness, in fact had 2 controlled falls yesterday with ambulation when his legs weakened.  He denies any injuries from these episodes.  He was seen at the cancer center this morning and sent here secondary to tachycardia at 122 and a tympanic temperature of 100.3, although it rechecked here his temperature was 99.6.  Denies dysuria.  He is on a once a week chemotherapy regimen but has missed several weeks.  Denies any recent antibiotic use.   The history is provided by the patient.       Prior to Admission medications  Medication Sig Start Date End Date Taking? Authorizing Provider  acetaminophen  (TYLENOL ) 500 MG tablet Take 1,000 mg by mouth every 8 (eight) hours as needed for mild pain (pain score 1-3) or moderate pain (pain score 4-6).    [provider]  atorvastatin  (LIPITOR) 20 MG tablet Take 1 tablet (20 mg total) by mouth daily. 07/11/24 10/09/24  Strader, Laymon HERO, PA-C  benzonatate  (TESSALON ) 200 MG capsule Take 1 capsule (200 mg total) by mouth 3  (three) times daily as needed for cough. 07/14/24   Geofm Delon BRAVO, NP  [Paused] bumetanide  (BUMEX ) 2 MG tablet Take 1 tablet (2 mg total) by mouth daily as needed. Patient taking differently: Take 2 mg by mouth daily. Wait to take this until your doctor or other care provider tells you to start again. 05/21/24   Strader, Laymon HERO, PA-C  buPROPion  (WELLBUTRIN  XL) 300 MG 24 hr tablet Take 300 mg by mouth daily. 09/17/23   [provider]  cetirizine  (ZYRTEC ) 10 MG tablet Take 10 mg by mouth daily.    [provider]  chlorproMAZINE  (THORAZINE ) 25 MG tablet Take 1 tablet (25 mg total) by mouth 3 (three) times daily as needed for hiccoughs. 07/28/24   [name] Callander, MD  cyclobenzaprine  (FLEXERIL ) 10 MG tablet Take 1 tablet (10 mg total) by mouth every 6 (six) hours as needed for muscle spasms. 07/15/24   Geofm Delon BRAVO, NP  dexamethasone  (DECADRON ) 4 MG tablet Take 2 tablets (8 mg total) by mouth daily. Start the day after chemotherapy for 2 days. Take with food. 08/04/24   Davonna Siad, MD  diphenoxylate -atropine  (LOMOTIL ) 2.5-0.025 MG tablet Take 1 tablet by mouth 4 (four) times daily as needed for diarrhea or loose stools. 08/13/24   Kandala, Hyndavi, MD  docusate sodium  (COLACE) 100 MG capsule Take 200 mg by mouth as needed for mild constipation.    [provider]  Eszopiclone 3 MG TABS Take 3 mg by mouth at bedtime.  [provider]  famotidine  (PEPCID ) 20 MG tablet Take 20 mg by mouth at bedtime. 09/17/23   [provider]  fenofibrate  160 MG tablet Take 160 mg by mouth daily. 11/05/19   [provider]  furosemide  (LASIX ) 20 MG tablet Take 1 tablet (20 mg total) by mouth 3 (three) times a week. Monday, Wednesday and Friday 08/04/24 11/02/24  Zenaida Morene PARAS, MD  lidocaine  (LIDODERM ) 5 % Place 1 patch onto the skin as needed (for pain). Remove & Discard patch within 12 hours or as directed by MD    [provider]  loperamide   (IMODIUM ) 2 MG capsule Take 1 capsule (2 mg total) by mouth as needed for diarrhea or loose stools. Take 2 capsules after the first watery stool, and then one capsule after each subsequent watery stool. Do not exceed 8 capsules in 24 hours 08/12/24   Kandala, Hyndavi, MD  magic mouthwash (lidocaine , diphenhydrAMINE , alum & mag hydroxide) suspension Swish and swallow 5 mLs 3 (three) times daily as needed for mouth pain. 07/15/24   Geofm Delon BRAVO, NP  Magnesium  Oxide -Mg Supplement 200 MG TABS Take 200 mg by mouth daily.    [provider]  metoCLOPramide  (REGLAN ) 10 MG tablet Take 1 tablet (10 mg total) by mouth every 8 (eight) hours as needed (Hiccups). 07/28/24   [name] Callander, MD  NIFEdipine  (PROCARDIA -XL/NIFEDICAL-XL) 30 MG 24 hr tablet Take 1 tablet (30 mg total) by mouth daily. 08/04/24   Zenaida Morene PARAS, MD  nitroGLYCERIN  (NITROSTAT ) 0.4 MG SL tablet Place 0.4 mg under the tongue every 5 (five) minutes x 3 doses as needed for chest pain (if no relief after 2nd dose, proceed to ED or call 911).    [provider]  omeprazole (PRILOSEC) 40 MG capsule Take 40 mg by mouth daily.    [provider]  ondansetron  (ZOFRAN ) 8 MG tablet Take 1 tablet (8 mg total) by mouth every 8 (eight) hours as needed for nausea or vomiting. Start on the third day after chemotherapy. 07/28/24   Kandala, Hyndavi, MD  pantoprazole  (PROTONIX ) 40 MG tablet Take 1 tablet (40 mg total) by mouth daily. Patient not taking: Reported on 08/16/2024 07/17/24 08/25/24  Ezenduka, Nkeiruka J, MD  pramipexole  (MIRAPEX ) 0.75 MG tablet Take 1 tablet (0.75 mg total) by mouth 3 (three) times daily. 08/30/22   Rogers Hai, MD  prochlorperazine  (COMPAZINE ) 10 MG tablet Take 1 tablet (10 mg total) by mouth every 6 (six) hours as needed for nausea or vomiting. 07/28/24   Davonna Siad, MD  tamsulosin  (FLOMAX ) 0.4 MG CAPS capsule Take 0.4 mg by mouth daily. 08/29/21   [provider]  ticagrelor   (BRILINTA ) 90 MG TABS tablet Take 1 tablet (90 mg total) by mouth 2 (two) times daily. 07/07/24   Al-Sultani, Anmar, MD  traMADol  (ULTRAM ) 50 MG tablet Take 50 mg by mouth at bedtime as needed for severe pain (pain score 7-10).    [provider]  traZODone  (DESYREL ) 100 MG tablet Take 0.5 tablets (50 mg total) by mouth at bedtime as needed for sleep. 08/26/24   Kandala, Hyndavi, MD  triamcinolone (KENALOG) 0.1 % paste Use as directed 1 Application in the mouth or throat daily as needed (for mouth ulcers). 07/11/24   [provider]    Allergies: Patient has no known allergies.    Review of Systems  Constitutional:  Positive for fatigue. Negative for chills and fever.  HENT:  Negative for congestion and sore throat.  Eyes: Negative.   Respiratory:  Positive for cough and shortness of breath. Negative for chest tightness.   Cardiovascular:  Negative for chest pain.  Gastrointestinal:  Positive for diarrhea. Negative for abdominal pain, nausea and vomiting.  Genitourinary: Negative.   Musculoskeletal:  Negative for arthralgias, joint swelling and neck pain.  Skin: Negative.  Negative for rash and wound.  Neurological:  Negative for dizziness, weakness, light-headedness, numbness and headaches.  Psychiatric/Behavioral: Negative.      Updated Vital Signs BP 100/61   Pulse (!) 111   Temp 99.1 F (37.3 C) (Oral)   Resp (!) 22   Ht 6' (1.829 m)   Wt 115.7 kg   SpO2 (!) 89%   BMI 34.58 kg/m   Physical Exam Vitals and nursing note reviewed.  Constitutional:      Appearance: He is well-developed.  HENT:     Head: Normocephalic and atraumatic.  Eyes:     Conjunctiva/sclera: Conjunctivae normal.  Cardiovascular:     Rate and Rhythm: Normal rate and regular rhythm.     Heart sounds: Normal heart sounds.  Pulmonary:     Effort: Pulmonary effort is normal.     Breath sounds: Normal breath sounds. No decreased breath sounds, wheezing, rhonchi or rales.  Abdominal:      General: Bowel sounds are normal.     Palpations: Abdomen is soft.     Tenderness: There is no abdominal tenderness.  Musculoskeletal:        General: Normal range of motion.     Cervical back: Normal range of motion.  Skin:    General: Skin is warm and dry.  Neurological:     Mental Status: He is alert.     (all labs ordered are listed, but only abnormal results are displayed) Labs Reviewed  RESP PANEL BY RT-PCR (RSV, FLU A&B, COVID)  RVPGX2 - Abnormal; Notable for the following components:      Result Value   Influenza A by PCR POSITIVE (*)    All other components within normal limits  COMPREHENSIVE METABOLIC PANEL WITH GFR - Abnormal; Notable for the following components:   CO2 18 (*)    Creatinine, Ser 1.53 (*)    Calcium  8.8 (*)    Total Protein 6.3 (*)    Albumin  3.1 (*)    AST 144 (*)    Alkaline Phosphatase 173 (*)    GFR, Estimated 49 (*)    Anion gap 18 (*)    All other components within normal limits  LACTIC ACID, PLASMA - Abnormal; Notable for the following components:   Lactic Acid, Venous 4.1 (*)    All other components within normal limits  LACTIC ACID, PLASMA - Abnormal; Notable for the following components:   Lactic Acid, Venous 2.5 (*)    All other components within normal limits  CBC WITH DIFFERENTIAL/PLATELET - Abnormal; Notable for the following components:   RBC 3.44 (*)    Hemoglobin 10.8 (*)    HCT 34.4 (*)    RDW 21.2 (*)    Neutro Abs 8.5 (*)    Lymphs Abs 0.3 (*)    All other components within normal limits  PRO BRAIN NATRIURETIC PEPTIDE - Abnormal; Notable for the following components:   Pro Brain Natriuretic Peptide 1,450.0 (*)    All other components within normal limits  D-DIMER, QUANTITATIVE - Abnormal; Notable for the following components:   D-Dimer, Quant 10.97 (*)    All other components within normal limits  CULTURE, BLOOD (ROUTINE X  2)  CULTURE, BLOOD (ROUTINE X 2)  C DIFFICILE QUICK SCREEN W PCR REFLEX    PROTIME-INR   URINALYSIS, W/ REFLEX TO CULTURE (INFECTION SUSPECTED)    EKG: EKG Interpretation Date/Time:  Thursday August 28 2024 12:46:32 EST Ventricular Rate:  118 PR Interval:  162 QRS Duration:  89 QT Interval:  305 QTC Calculation: 428 R Axis:   35  Text Interpretation: Sinus tachycardia Low voltage, precordial leads No significant change since prior 12/25 Confirmed by Towana Sharper [phone]) on 08/28/2024 3:09:46 PM  Radiology: CT Angio Chest PE W and/or Wo Contrast Result Date: 08/28/2024 CLINICAL DATA:  Concern for pulmonary embolism. EXAM: CT ANGIOGRAPHY CHEST WITH CONTRAST TECHNIQUE: Multidetector CT imaging of the chest was performed using the standard protocol during bolus administration of intravenous contrast. Multiplanar CT image reconstructions and MIPs were obtained to evaluate the vascular anatomy. RADIATION DOSE REDUCTION: This exam was performed according to the departmental dose-optimization program which includes automated exposure control, adjustment of the mA and/or kV according to patient size and/or use of iterative reconstruction technique. CONTRAST:  75mL OMNIPAQUE  IOHEXOL  350 MG/ML SOLN COMPARISON:  Chest radiograph dated 08/28/2024. FINDINGS: Cardiovascular: There is no cardiomegaly or pericardial effusion. There is 3 vessel coronary vascular calcification. Mild atherosclerotic calcification of the thoracic aorta. No aneurysmal dilatation or dissection. The origins of the great vessels of the aortic arch appear patent. Right-sided Port-A-Cath with tip over central SVC. No pulmonary artery embolus identified. Mediastinum/Nodes: No hilar or mediastinal adenopathy. The esophagus is grossly unremarkable no mediastinal fluid collection. Lungs/Pleura: Small left pleural effusion with partial compressive atelectasis of the left lower lobe. Patchy area of streaky and nodular density in the left upper lobe and lingula consistent with pneumonia. Aspiration is not excluded. Additional  reticulonodular density at the right lung base may represent atelectasis or infiltrate. No pneumothorax. The central airways are patent. Upper Abdomen: Partially visualized ascites Musculoskeletal: Degenerative changes of the spine. No acute osseous pathology. Review of the MIP images confirms the above findings. IMPRESSION: 1. No CT evidence of pulmonary artery embolus. 2. Left upper lobe and lingular pneumonia. Aspiration is not excluded. 3. Small left pleural effusion with partial compressive atelectasis of the left lower lobe. 4. Partially visualized ascites. 5.  Aortic Atherosclerosis (ICD10-I70.0). Electronically Signed   By: Vanetta Chou M.D.   On: 08/28/2024 18:17   DG Chest Port 1 View Result Date: 08/28/2024 CLINICAL DATA:  Questionable sepsis - evaluate for abnormality EXAM: PORTABLE CHEST - 1 VIEW COMPARISON:  07/16/2024 FINDINGS: Right chest port is similarly positioned terminating in the lower SVC. Unchanged trace left pleural effusion. Streaky left basilar atelectasis. No pneumothorax or new airspace consolidation. The cardiac silhouette is at the upper limits of normal, likely accentuated by AP technique. Aortic atherosclerosis. No acute fracture or destructive lesions. Multilevel thoracic osteophytosis. IMPRESSION: Unchanged trace left pleural effusion with streaky left basilar atelectasis. Electronically Signed   By: Rogelia Myers M.D.   On: 08/28/2024 13:35     Procedures   Medications Ordered in the ED  azithromycin  (ZITHROMAX ) 500 mg in sodium chloride  0.9 % 250 mL IVPB (500 mg Intravenous New Bag/Given 08/28/24 1923)  sodium chloride  0.9 % bolus 1,000 mL (0 mLs Intravenous Stopped 08/28/24 1635)  cefTRIAXone  (ROCEPHIN ) 2 g in sodium chloride  0.9 % 100 mL IVPB (0 g Intravenous Stopped 08/28/24 1416)  sodium chloride  0.9 % bolus 1,000 mL (0 mLs Intravenous Stopped 08/28/24 1901)  iohexol  (OMNIPAQUE ) 350 MG/ML injection 75 mL (75 mLs Intravenous Contrast Given 08/28/24 1737)  lactated ringers  bolus 1,000 mL (1,000 mLs Intravenous New Bag/Given 08/28/24 1923)                                    Medical Decision Making Patient with history of metastatic cancer seen by his oncologist this morning but found to be short of breath, tachycardic.  He denies fevers although the tympanic temperature prior to arrival was 100.3.  He endorses shortness of breath has been present for weeks but has slowly worsened.  He denies fevers or chills, no nausea or vomiting, no chest pain, he has had diarrhea the past several days.  Differential diagnosis including viral or bacterial URI/pneumonia/bronchitis, with his cancer diagnosis is also at increased risk for PE.  Could be atypical ACS although denies chest pain.  Concern for possible sepsis given his borderline temperature prior to arrival.  He has been afebrile since here.  Labs and imaging were obtained including a CT angio given he had a significant elevation in his D-dimer, it is negative for PE but positive for pneumonia in his left upper and lingula.  He is also positive for influenza A.  His initial lactic acid significantly elevated at 4.1, repeat at 2.5.  He was given IV antibiotics including Rocephin  and Zithromax .  He was given 2 L of normal saline, given third liter of lactated Ringer 's.  His maps remained greater than 60 except for 1 outlier early in his course.  Patient will need admission.  Amount and/or Complexity of Data Reviewed Labs: ordered.    Details: Significant labs as outlined above.  Additionally patient had a elevated D-dimer at 10.97, he has a normal WBC count at 9.8, his hemoglobin is 10.8, his proBNP is 1450 CMET significant for creatinine 1.53 with a normal BUN of 14, his AST is elevated 144 alk phos 173, positive for influenza A Radiology: ordered.    Details: CT angio chest negative for PE, positive for pneumonia as outlined above. Discussion of management or test interpretation with external provider(s):  Patient discussed with Dr. Toy Stagger who accepts patient for admission  Risk Prescription drug management. Decision regarding hospitalization.        Final diagnoses:  Pneumonia of left lung due to infectious organism, unspecified part of lung  Influenza A  Sepsis without acute organ dysfunction, due to unspecified organism [facility])    ED Discharge Orders     None          Birdena Clarity, DEVONNA 08/28/24 1959

## 2024-08-29 DIAGNOSIS — R6520 Severe sepsis without septic shock: Secondary | ICD-10-CM | POA: Diagnosis not present

## 2024-08-29 DIAGNOSIS — J09X1 Influenza due to identified novel influenza A virus with pneumonia: Secondary | ICD-10-CM | POA: Diagnosis not present

## 2024-08-29 DIAGNOSIS — C221 Intrahepatic bile duct carcinoma: Secondary | ICD-10-CM | POA: Diagnosis not present

## 2024-08-29 DIAGNOSIS — A419 Sepsis, unspecified organism: Secondary | ICD-10-CM | POA: Diagnosis not present

## 2024-08-29 DIAGNOSIS — I25110 Atherosclerotic heart disease of native coronary artery with unstable angina pectoris: Secondary | ICD-10-CM

## 2024-08-29 LAB — C DIFFICILE QUICK SCREEN W PCR REFLEX
C Diff antigen: NEGATIVE
C Diff interpretation: NOT DETECTED
C Diff toxin: NEGATIVE

## 2024-08-29 LAB — BASIC METABOLIC PANEL WITH GFR
Anion gap: 9 (ref 5–15)
BUN: 11 mg/dL (ref 8–23)
CO2: 25 mmol/L (ref 22–32)
Calcium: 7.8 mg/dL — ABNORMAL LOW (ref 8.9–10.3)
Chloride: 101 mmol/L (ref 98–111)
Creatinine, Ser: 1.26 mg/dL — ABNORMAL HIGH (ref 0.61–1.24)
GFR, Estimated: >60 mL/min
Glucose, Bld: 89 mg/dL (ref 70–99)
Potassium: 3.5 mmol/L (ref 3.5–5.1)
Sodium: 135 mmol/L (ref 135–145)

## 2024-08-29 LAB — CBC
HCT: 27.3 % — ABNORMAL LOW (ref 39.0–52.0)
Hemoglobin: 8.6 g/dL — ABNORMAL LOW (ref 13.0–17.0)
MCH: 31.2 pg (ref 26.0–34.0)
MCHC: 31.5 g/dL (ref 30.0–36.0)
MCV: 98.9 fL (ref 80.0–100.0)
Platelets: 213 K/uL (ref 150–400)
RBC: 2.76 MIL/uL — ABNORMAL LOW (ref 4.22–5.81)
RDW: 21 % — ABNORMAL HIGH (ref 11.5–15.5)
WBC: 5.4 K/uL (ref 4.0–10.5)
nRBC: 0 % (ref 0.0–0.2)

## 2024-08-29 LAB — GLUCOSE, CAPILLARY
Glucose-Capillary: 95 mg/dL (ref 70–99)
Glucose-Capillary: 133 mg/dL — ABNORMAL HIGH (ref 70–99)
Glucose-Capillary: 122 mg/dL — ABNORMAL HIGH (ref 70–99)
Glucose-Capillary: 113 mg/dL — ABNORMAL HIGH (ref 70–99)

## 2024-08-29 LAB — STREP PNEUMONIAE URINARY ANTIGEN: Strep Pneumo Urinary Antigen: NEGATIVE

## 2024-08-29 LAB — MRSA NEXT GEN BY PCR, NASAL: MRSA by PCR Next Gen: NOT DETECTED

## 2024-08-29 NOTE — Progress Notes (Signed)
 SPIRITUAL CARE AND COUNSELING CONSULT NOTE   VISIT SUMMARY   Reason for Visit: Chaplain following up with one of our Cancer Center Pt's was hospitalized yesterday.  Description of Visit: Upon entering the room I found Rick in the bed and there was no support person present.   Dick was very weak and tired.  He shared some about his condition and what next steps might be, but talking was wearing him out.  After a brief conversation about Christmas plans I prayed for him and exited the room to allow him to rest.   Plan of Care: I will plan to follow up with this Pt throughout this hospitalization.   SPIRITUAL ENCOUNTER                                                                                                                                                                      Type of Visit: Follow up Care provided to:: Patient Referral source: Chaplain assessment Reason for visit: Routine spiritual support OnCall Visit: No   SPIRITUAL FRAMEWORK  Presenting Themes: Goals in life/care, Significant life change, Caregiving needs Values/beliefs: Pt is a radio broadcast assistant Christian Community/Connection: Limited Strengths: Spirituality; Humilty; Love Needs/Challenges/Barriers: Repeated hopitalization have kept Pt from recieveing Chemotherapy Patient Stress Factors: Exhausted, Lack of caregivers, Major life changes Family Stress Factors: None identified   GOALS   Self/Personal Goals: To get better Clinical Care Goals: Promote Peace and Calm   INTERVENTIONS   Spiritual Care Interventions Made: Compassionate presence, Reflective listening, Explored values/beliefs/practices/strengths, Prayer, Encouragement    INTERVENTION OUTCOMES   Outcomes: Reduced isolation, Reduced fear, Awareness of support  SPIRITUAL CARE PLAN   Spiritual Care Issues Still Outstanding: Chaplain will continue to follow    If immediate needs arise, please consult Zelda Salmon AMION schedule for  chaplain coverage  Maude Roll, MDiv Chaplain, [facility] [email or web address] [email or web address] [phone] 08/29/2024 12:24 PM

## 2024-08-29 NOTE — TOC Initial Note (Signed)
 Transition of Care [facility]) - Initial/Assessment Note    Patient Details  Name: Marvin Carr MRN: [id number] Date of Birth: 10-11-1955  Transition of [facility]) CM/SW Contact:    Noreen KATHEE Pinal, LCSWA Phone Number: 08/29/2024, 12:28 PM  Clinical Narrative:                  Patient is high risk for readmission. Patient was admitted for Severe sepsis. Patient lives alone with cats and uses a cane/ walker. Patient has no in home care services , but interested in options . CSW informed patient that PT will come to assess for recommendation. Patient prefers HH if possible. CSW will continue to follow.    Barriers to Discharge: Continued Medical Work up   Patient Goals and CMS Choice Patient states their goals for this hospitalization and ongoing recovery are:: get well CMS Medicare.gov Compare Post Acute Care list provided to:: Patient Choice offered to / list presented to : Patient      Expected Discharge Plan and Services     Post Acute Care Choice: Durable Medical Equipment Living arrangements for the past 2 months: Single Family Home                                      Prior Living Arrangements/Services Living arrangements for the past 2 months: Single Family Home   Patient language and need for interpreter reviewed:: Yes Do you feel safe going back to the place where you live?: Yes      Need for Family Participation in Patient Care: No (Comment) Care giver support system in place?: No (comment) Current home services: DME Criminal Activity/Legal Involvement Pertinent to Current Situation/Hospitalization: No - Comment as needed  Activities of Daily Living   ADL Screening (condition at time of admission) Independently performs ADLs?: Yes (appropriate for developmental age) Is the patient deaf or have difficulty hearing?: No Does the patient have difficulty seeing, even when wearing glasses/contacts?: No Does the patient have difficulty concentrating,  remembering, or making decisions?: No  Permission Sought/Granted      Share Information with NAME: Ronney     Permission granted to share info w Relationship: Patient     Emotional Assessment Appearance:: Appears stated age Attitude/Demeanor/Rapport: Engaged Affect (typically observed): Accepting Orientation: : Oriented to Self, Oriented to Place, Oriented to  Time, Oriented to Situation Alcohol / Substance Use: Not Applicable Psych Involvement: No (comment)  Admission diagnosis:  Influenza A [J10.1] Sepsis (HCC) [A41.9] Pneumonia of left lung due to infectious organism, unspecified part of lung [J18.9] Sepsis without acute organ dysfunction, due to unspecified organism [facility]) [A41.9] Patient Active Problem List   Diagnosis Date Noted   Influenza A with pneumonia 08/29/2024   Severe sepsis (HCC) 08/28/2024   Ischemic bowel disease 08/16/2024   Ischemic bowel syndrome 08/16/2024   Costochondral chest pain 07/17/2024   S/P primary angioplasty with coronary stent 07/17/2024   Unstable angina (HCC) 07/12/2024   Chest pain 07/03/2024   Anxiety    IDA (iron deficiency anemia) 05/09/2024   Malignant ascites (HCC) 05/09/2024   Dry cough 05/09/2024   Hypokalemia 05/09/2024   Hypomagnesemia 05/09/2024   Insomnia 05/09/2024   S/p revision of left total hip 01/15/2024   S/P total left hip arthroplasty 01/15/2024   DOE (dyspnea on exertion) 09/25/2023   Chemotherapy-induced peripheral neuropathy 07/03/2023   Nocturnal hypoxia 04/06/2023   Neuropathy associated with cancer (HCC) 02/22/2023  Edema due to hypoalbuminemia 02/22/2023   RLS (restless legs syndrome) 02/22/2023   History of insomnia 02/22/2023   History of restless legs syndrome 02/22/2023   Chronic skin ulcer (HCC) 02/22/2023   OSA (obstructive sleep apnea) 01/29/2023   Fever 06/04/2022   Sepsis due to cellulitis (HCC) 06/04/2022   Morbid obesity due to excess calories (HCC)    Depression    Pre-diabetes     Forgetfulness 03/29/2022   Port-A-Cath in place 09/30/2021   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   Lesion of liver 09/19/2021   Peritoneal lesion 09/19/2021   Constipation 09/19/2021   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   Degeneration of intervertebral disc of lumbar region 09/01/2021   Neuropathy    CAD (coronary artery disease), native coronary artery 03/30/2013   Essential hypertension 03/30/2013   Mixed hyperlipidemia 03/30/2013   PCP:  [name], MD Pharmacy:   [facility] [place], [place] - [address] [place] [address] Phone: [phone] Fax: [phone]  [facility] [address] [place] [address] Phone: [phone] Fax: [phone]     Social Drivers of Health (SDOH) Social History: SDOH Screenings   Food Insecurity: No Food Insecurity (08/28/2024)  Housing: Low Risk (08/28/2024)  Transportation Needs: No Transportation Needs (08/28/2024)  Utilities: Not At Risk (08/28/2024)  Depression (PHQ2-9): Low Risk (08/25/2024)  Recent Concern: Depression (PHQ2-9) - Medium Risk (08/13/2024)  Social Connections: Moderately Isolated (08/28/2024)  Tobacco Use: Medium Risk (08/28/2024)   SDOH Interventions:     Readmission Risk Interventions    08/29/2024   12:26 PM 07/03/2024   10:21 AM 06/05/2022    8:38 AM  Readmission Risk Prevention Plan  Transportation Screening Complete Complete Complete  Home Care Screening   Complete  Medication Review (RN CM)   Complete  HRI or Home Care Consult  Complete   Social Work Consult for Recovery Care Planning/Counseling  Complete   Palliative Care Screening  Not Applicable   Medication Review Oceanographer) Complete Complete   HRI or Home Care Consult Complete    SW Recovery Care/Counseling Consult Complete    Palliative Care Screening Not Applicable    Skilled Nursing Facility Not Complete    SNF Comments  Pending PT consult

## 2024-08-29 NOTE — Progress Notes (Signed)
 " Progress Note   Patient: Marvin Carr FMW:[id number] DOB: [birth year] DOA: 08/28/2024  DOS: the patient was seen and examined on 08/29/2024   Brief hospital course:   68 y.o. 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  Assessment and Plan:  Concern for severe sepsis - Fever, tachycardia, dyspnea, influenza A, hypotension given concern for severe sepsis.  Blood pressure responding well to IV fluid bolus.  No need for vasopressor medications at this time.  Blood cultures pending, empiric antibiotics on board.  Monitor blood pressure closely.  Acute influenza A with concern for bacterial superinfection - Rapid screen positive for influenza A.  Given recent hospitalization, coverage for HCAP empirically.  Currently on Rocephin , azithromycin , vancomycin .  MRSA PCR negative and minimal leukocytosis, will discontinue vancomycin .  Strep pneumo negative.  Pending sputum, Legionella.  Tamiflu  on board.  Monitor respiratory function closely.  Acute hypoxic respiratory failure - Secondary to influenza A.  Currently on 2 L nasal cannula.  Will attempt to wean O2 as tolerated.  Metastatic cholangiocarcinoma - Noted peritoneal carcinomatosis and metastatic disease to the liver and bile duct.  Follows with oncology in the outpatient setting.  Not receiving any recent chemotherapy due to multiple hospitalizations.  CAD/HLD - S/p LCx stent 10/25.  Continue Brilinta .  Aspirin  discontinued during recent hospitalization per cardiology recommendations.  Tolerated fenofibrate  as before.  Chronic HFpEF/HTN -Appear to be exacerbated.  Consider restarting antihypertensives now that volume  resuscitated.  Diabetes mellitus - Insulin  sliding scale ordered.  BPH - Flomax  support.  Anxiety/depression - Resume Wellbutrin  300 mg daily.  Subjective: Patient sitting up at the bedside, still feels profoundly weak, short of breath otherwise wiped out.  Denies any worsening cough, purulent sputum, chest pain, nausea, vomiting, abdominal pain.  Physical Exam:  Vitals:   08/29/24 0630 08/29/24 0651 08/29/24 0700 08/29/24 0715  BP: (!) 153/68  (!) 164/77   Pulse: (!) 105  (!) 111   Resp: (!) 37  (!) 41   Temp:  99.4 F (37.4 C)  99.4 F (37.4 C)  TempSrc:  Oral  Oral  SpO2: 95%  95%   Weight:      Height:        GENERAL:  Alert, pleasant, no acute distress, ill-appearing HEENT:  EOMI, nasal cannula CARDIOVASCULAR: Regular tachycardic RESPIRATORY: Dyspneic, poor air movement GASTROINTESTINAL:  Soft, nontender, nondistended EXTREMITIES:  No LE edema bilaterally NEURO:  No new focal deficits appreciated SKIN:  No rashes noted PSYCH:  Appropriate mood and affect     Data Reviewed:  Imaging Studies: CT Angio Chest PE W and/or Wo Contrast Result Date: 08/28/2024 CLINICAL DATA:  Concern for pulmonary embolism. EXAM: CT ANGIOGRAPHY CHEST WITH CONTRAST TECHNIQUE: Multidetector CT imaging of the chest was performed using the standard protocol during bolus administration of intravenous contrast. Multiplanar CT image reconstructions and MIPs were obtained to evaluate the vascular anatomy. RADIATION DOSE REDUCTION: This exam was performed according to the departmental dose-optimization program which includes automated exposure control, adjustment of the mA and/or kV according to patient size and/or use of iterative reconstruction technique. CONTRAST:  75mL OMNIPAQUE  IOHEXOL  350 MG/ML SOLN COMPARISON:  Chest radiograph dated 08/28/2024. FINDINGS: Cardiovascular: There is no cardiomegaly or pericardial effusion. There is 3  vessel coronary vascular calcification. Mild atherosclerotic  calcification of the thoracic aorta. No aneurysmal dilatation or dissection. The origins of the great vessels of the aortic arch appear patent. Right-sided Port-A-Cath with tip over central SVC. No pulmonary artery embolus identified. Mediastinum/Nodes: No hilar or mediastinal adenopathy. The esophagus is grossly unremarkable no mediastinal fluid collection. Lungs/Pleura: Small left pleural effusion with partial compressive atelectasis of the left lower lobe. Patchy area of streaky and nodular density in the left upper lobe and lingula consistent with pneumonia. Aspiration is not excluded. Additional reticulonodular density at the right lung base may represent atelectasis or infiltrate. No pneumothorax. The central airways are patent. Upper Abdomen: Partially visualized ascites Musculoskeletal: Degenerative changes of the spine. No acute osseous pathology. Review of the MIP images confirms the above findings. IMPRESSION: 1. No CT evidence of pulmonary artery embolus. 2. Left upper lobe and lingular pneumonia. Aspiration is not excluded. 3. Small left pleural effusion with partial compressive atelectasis of the left lower lobe. 4. Partially visualized ascites. 5.  Aortic Atherosclerosis (ICD10-I70.0). Electronically Signed   By: Vanetta Chou M.D.   On: 08/28/2024 18:17   DG Chest Port 1 View Result Date: 08/28/2024 CLINICAL DATA:  Questionable sepsis - evaluate for abnormality EXAM: PORTABLE CHEST - 1 VIEW COMPARISON:  07/16/2024 FINDINGS: Right chest port is similarly positioned terminating in the lower SVC. Unchanged trace left pleural effusion. Streaky left basilar atelectasis. No pneumothorax or new airspace consolidation. The cardiac silhouette is at the upper limits of normal, likely accentuated by AP technique. Aortic atherosclerosis. No acute fracture or destructive lesions. Multilevel thoracic osteophytosis. IMPRESSION: Unchanged trace left pleural effusion with streaky left basilar atelectasis.  Electronically Signed   By: Rogelia Myers M.D.   On: 08/28/2024 13:35   DG Abd Portable 1V Result Date: 08/19/2024 CLINICAL DATA:  Bowel obstruction. EXAM: PORTABLE ABDOMEN - 1 VIEW COMPARISON:  08/18/2024 FINDINGS: NG tube tip is in the proximal stomach. Although side port of the NG tube is not clearly visualized, it is likely in the distal esophagus. Gaseous distention of transverse colon noted without frank dilatation. Gas is visible in the nondistended rectum. Air is seen scattered through nondilated small bowel loops. IMPRESSION: 1. NG tube tip is in the proximal stomach. Although side port of the NG tube is not clearly visualized, it is likely in the distal esophagus. 2. Nonspecific bowel gas pattern. Electronically Signed   By: Camellia Candle M.D.   On: 08/19/2024 07:21   DG Abd 1 View Result Date: 08/18/2024 CLINICAL DATA:  Enteric catheter placement EXAM: ABDOMEN - 1 VIEW COMPARISON:  08/17/2024 FINDINGS: Frontal view of the lower chest and upper abdomen demonstrates stable position of the enteric catheter, tip projecting over the gastric fundus. Side port projects proximally 2.5 cm above the left hemidiaphragm. Bowel gas pattern is unremarkable. Stable left pleural effusion and left lower lobe consolidation. IMPRESSION: 1. Enteric catheter tip projecting over the gastric fundus, with side port projecting 2.5 cm above the left hemidiaphragm. 2. Stable left pleural effusion and left basilar consolidation. Electronically Signed   By: Ozell Daring M.D.   On: 08/18/2024 17:10   DG Abd 1 View Result Date: 08/17/2024 EXAM: 1 VIEW XRAY OF THE ABDOMEN 08/17/2024 12:28:00 AM COMPARISON: None available. CLINICAL HISTORY: Encounter for nasogastric (NG) tube placement. FINDINGS: LINES, TUBES AND DEVICES: Enteric tube courses below the hemidiaphragm with the tip overlying the gastric lumen and side port not well visualized, likely in the region of the gastroesophageal junction. BOWEL: Nonobstructive bowel  gas pattern. SOFT TISSUES: No abnormal calcifications. BONES: No acute fracture. LIMITATIONS: Right flank and pelvis collimated off view. IMPRESSION: 1. Enteric tube tip overlies the gastric lumen; side port is not well visualized and is likely at the gastroesophageal junction, consider advancing the tube slightly to ensure the side port is intragastric. Electronically signed by: Morgane Naveau MD 08/17/2024 12:33 AM EST RP Workstation: [id number]   CT ANGIO GI BLEED Result Date: 08/16/2024 EXAM: CTA ABDOMEN AND PELVIS WITH CONTRAST 08/16/2024 03:23:46 PM TECHNIQUE: CTA images of the abdomen and pelvis with intravenous contrast. 100 mL iohexol  (OMNIPAQUE ) 350 MG/ML injection. Three-dimensional MIP/volume rendered formations were performed. Automated exposure control, iterative reconstruction, and/or weight based adjustment of the mA/kV was utilized to reduce the radiation dose to as low as reasonably achievable. COMPARISON: 05/15/2024 and 06/03/2024. CLINICAL HISTORY: Suspected GI bleed. FINDINGS: VASCULATURE: GI BLEED: No active extravasation of contrast within the GI tract. AORTA: No acute finding. No abdominal aortic aneurysm. No dissection. CELIAC TRUNK: No acute finding. No occlusion or significant stenosis. SUPERIOR MESENTERIC ARTERY: No acute finding. No occlusion or significant stenosis. INFERIOR MESENTERIC ARTERY: No acute finding. No occlusion or significant stenosis. RENAL ARTERIES: No acute finding. No occlusion or significant stenosis. ILIAC ARTERIES: No acute finding. No occlusion or significant stenosis. ABDOMEN/PELVIS: LOWER CHEST: Visualized portion of the lower chest demonstrates no acute abnormality. LIVER: Hepatic low density is again noted consistent with metastatic disease. GALLBLADDER AND BILE DUCTS: Cholelithiasis. No biliary ductal dilatation. SPLEEN: The spleen is unremarkable. PANCREAS: The pancreas is unremarkable. ADRENAL GLANDS: Bilateral adrenal glands demonstrate no acute  abnormality. KIDNEYS, URETERS AND BLADDER: No stones in the kidneys or ureters. No hydronephrosis. No perinephric or periureteral stranding. Urinary bladder is unremarkable. GI AND BOWEL: Severely dilated small bowel loops are noted with intramural gas in multiple small bowel loops consistent with ischemic bowel disease. No colonic dilatation is noted. Stomach and duodenal sweep demonstrate no acute abnormality. REPRODUCTIVE: Reproductive organs are unremarkable. PERITONEUM AND RETROPERITONEUM: Mild ascites is noted. 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 No free air. LYMPH NODES: No lymphadenopathy. BONES AND SOFT TISSUES: Status post bilateral hip arthroplasties. No acute abnormality of the bones. No acute soft tissue abnormality. IMPRESSION: 1. Severely dilated small bowel loops with intramural gas, consistent with ischemic bowel disease. This finding was discussed with Dr. Melvenia at 3:38 pm on 08/16/2024. 2. No active gastrointestinal bleeding identified. 3. Hepatic low-density lesions consistent with metastatic disease. 4. 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 Electronically signed by: Lynwood Seip MD 08/16/2024 03:40 PM EST RP Workstation: [id number]   US  ASCITES (ABDOMEN LIMITED) Result Date: 08/15/2024 CLINICAL DATA:  Patient with history of cholangiocarcinoma with malignant ascites, recently started on diuretic medication. Request for possible therapeutic paracentesis. EXAM: LIMITED ABDOMEN ULTRASOUND FOR ASCITES TECHNIQUE: Limited ultrasound survey for ascites was performed in all four abdominal quadrants. COMPARISON:  None Available. FINDINGS: Limited abdominal ultrasound of all four abdominal quadrants shows very small midline right upper quadrant pocket of ascites. No other areas of peritoneal fluid seen. IMPRESSION: Very small midline right upper quadrant pocket of ascites, not amenable to safe paracentesis. No procedure performed.  Electronically Signed   By: Wilkie Lent M.D.   On: 08/15/2024 14:08    There are no new results to review at this time.  Previous records (including but not limited to H&P, progress notes, nursing notes, TOC management) were reviewed in assessment of this patient.  Labs: CBC: Recent Labs  Lab 08/23/24 0326 08/28/24 1211 08/28/24 1334 08/29/24 0459  WBC 7.1 10.2 9.8 5.4  NEUTROABS  --  8.6* 8.5*  --   HGB 8.9* 9.4* 10.8* 8.6*  HCT 28.3* 30.1* 34.4* 27.3*  MCV 97.6 99.7 100.0 98.9  PLT 169 235 272 213   Basic Metabolic Panel: Recent Labs  Lab 08/28/24 1211 08/28/24 1334 08/29/24 0459  NA 135 135 135  K 3.8 3.7 3.5  CL 101 99 101  CO2 16* 18* 25  GLUCOSE 127* 92 89  BUN 14 14 11   CREATININE 1.59* 1.53* 1.26*  CALCIUM  8.3* 8.8* 7.8*   Liver Function Tests: Recent Labs  Lab 08/28/24 1211 08/28/24 1334  AST 126* 144*  ALT 33 38  ALKPHOS 147* 173*  BILITOT 0.3 0.4  PROT 5.5* 6.3*  ALBUMIN  2.7* 3.1*   CBG: Recent Labs  Lab 08/22/24 2247 08/23/24 0754 08/23/24 1155 08/28/24 2332 08/29/24 0739  GLUCAP 118* 137* 97 106* 95    Scheduled Meds:  buPROPion   300 mg Oral Daily   Chlorhexidine  Gluconate Cloth  6 each Topical Q0600   heparin   5,000 Units Subcutaneous Q8H   midodrine   5 mg Oral TID WC   oseltamivir   75 mg Oral BID   pantoprazole   40 mg Oral Daily   pramipexole   0.75 mg Oral TID   tamsulosin   0.4 mg Oral Daily   ticagrelor   90 mg Oral BID   Continuous Infusions:  azithromycin      cefTRIAXone  (ROCEPHIN )  IV     vancomycin  2,000 mg (08/28/24 2035)   PRN Meds:.chlorproMAZINE , diphenoxylate -atropine , hydrALAZINE , zolpidem   Family Communication: None at bedside  Disposition: Status is: Inpatient Remains inpatient appropriate because: Severe sepsis, influenza A     Time spent: 42 minutes  Length of inpatient stay: 1 days  Author: Carliss LELON Canales, DO 08/29/2024 11:06 AM  For on call review [email or web address].   "

## 2024-08-29 NOTE — Plan of Care (Signed)
" °  Problem: Acute Rehab PT Goals(only PT should resolve) Goal: Pt Will Go Supine/Side To Sit Outcome: Progressing Flowsheets (Taken 08/29/2024 1403) Pt will go Supine/Side to Sit: with contact guard assist Goal: Patient Will Transfer Sit To/From Stand Outcome: Progressing Flowsheets (Taken 08/29/2024 1403) Patient will transfer sit to/from stand: with supervision Goal: Pt Will Transfer Bed To Chair/Chair To Bed Outcome: Progressing Flowsheets (Taken 08/29/2024 1403) Pt will Transfer Bed to Chair/Chair to Bed: with supervision Goal: Pt Will Ambulate Outcome: Progressing Flowsheets (Taken 08/29/2024 1403) Pt will Ambulate:  15 feet  with rolling walker  with supervision Goal: Pt Will Go Up/Down Stairs Outcome: Progressing Flowsheets (Taken 08/29/2024 1403) Pt will Go Up / Down Stairs:  3-5 stairs  with rail(s)  with contact guard assist   2:04 PM, 08/29/2024 Telia Amundson Powell-Butler, PT, DPT [place] with [facility]  "

## 2024-08-29 NOTE — Evaluation (Signed)
 Physical Therapy Evaluation Patient Details Name: Marvin Carr MRN: [id number] DOB: 06-27-1956 Today's Date: 08/29/2024  History of Present Illness  Marvin Carr  is a 68 y.o. 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  - She presents to ED today secondary to dyspnea, lysed weakness, fatigue, he denies chest pain, reports orthopnea, cough, nonproductive, fever and chills at home, he has diarrhea which is chronic, forts weakness during ambulation yesterday where he felt his legs gave up or he had a fall, had oncology follow-up appointment today where he was noted to be febrile 100.3, tachypneic and tachycardic so he was brought to ED for further evaluation.  -In ED workup significant for Lones AF pneumonia, CTA chest negative for PE but significant for multifocal pneumonia, labs were significant for  Creatinine, elevated lactic acid at 4.1, improved with IV fluids, his respiratory panel positive for influenza A, negative UA, [facility] hospitalist consulted to admit.   Clinical Impression  Patient agreeable to PT evaluation. Reports at baseline, he uses SPC intermittently for ambulation, especially In community and is independent with ADLs but he has had more difficulty recently. On this date, patient requires min/mod assist for bed mobility, min assist for functional transfers, and min/CGA for very short ambulation trial at EOB with RW. Pt limited mostly due to inc fatigue  and general weakness throughout. Pt vitals stable throughout with pt on 2 Lpm but pt does demo moderate SOB with all mobility. Pt returns to bed at end of session, reporting he had been sitting in chair earlier in the morning, nursing confirms. Call button in reach, all needs met, and nursing present upon leaving room. Patient will benefit from continued skilled physical therapy acutely and in recommended venue in order to address current deficits to improve overall function.        If plan is discharge home, recommend the following: A lot of help with walking and/or transfers;A lot of help with bathing/dressing/bathroom;Assist for transportation;Assistance with cooking/housework;Help with stairs or ramp for entrance   Can travel by private vehicle        Equipment Recommendations None recommended by PT  Recommendations for Other Services       Functional Status Assessment Patient has had a recent decline in their functional status and demonstrates the ability to make significant improvements in function in a reasonable and predictable amount of time.     Precautions / Restrictions Precautions Precautions: Fall Recall of Precautions/Restrictions: Intact Restrictions Weight Bearing Restrictions Per Provider Order: No      Mobility  Bed Mobility Overal bed mobility: Needs Assistance Bed Mobility: Supine to Sit, Sit to Supine     Supine to sit: Min assist Sit to supine: Mod assist, Min assist   General bed mobility comments: pt demo slow labored movement throughout, supine to sit assisted with trunk elevation. sit to supine assisted with LE handling    Transfers Overall transfer level: Needs assistance Equipment used: Rolling walker (2 wheels) Transfers: Sit to/from Stand Sit to Stand: Min assist  General transfer comment: min assist with STS form bed due to inc LE weakness, pt attempted independently, slow labored movement throughout, limited by fatigue     Ambulation/Gait Ambulation/Gait assistance: Contact guard assist, Min assist Gait Distance (Feet): 5 Feet Assistive device: Rolling walker (2 wheels) Gait Pattern/deviations: Step-to pattern, Decreased step length - right, Decreased step length - left Gait velocity: Decreased     General Gait Details: Pt limited to a few side steps at bedside with RW and min/CGA for steadying at times, limited by fatigue and LE weakness, pt demo very labored movement  Stairs            Wheelchair Mobility     Tilt Bed    Modified [name] (Stroke Patients Only)       Balance Overall balance assessment: Needs assistance Sitting-balance support: No upper extremity supported, Feet supported Sitting balance-Leahy Scale: Fair Sitting balance - Comments: seated EOB   Standing balance support: During functional activity, Reliant on assistive device for balance, Bilateral upper extremity supported Standing balance-Leahy Scale: Fair Standing balance comment: w/ RW                             Pertinent Vitals/Pain Pain Assessment Pain Assessment: No/denies pain    Home Living Family/patient expects to be discharged to:: Private residence Living Arrangements: Alone Available Help at Discharge: Family;Friend(s);Available PRN/intermittently Type of Home: House Home Access: Stairs to enter Entrance Stairs-Rails: Lawyer of Steps: 3   Home Layout: One level Home Equipment: Agricultural Consultant (2 wheels);Cane - single point;Rollator (4 wheels)      Prior Function Prior Level of Function : Independent/Modified Independent             Mobility Comments: endorses SPC use PRN, works from home, just purchased a rollator but has not used yet, reports 2 falls in last 6 months ADLs Comments: independent but reports having more difficulty     Extremity/Trunk Assessment   Upper Extremity Assessment Upper Extremity Assessment: Generalized weakness (shoulder  flexion ROM limited to ~145 degrees, PROM to normal limits w/o excessive pain, MMT 4-/5 with pain)    Lower Extremity Assessment Lower Extremity Assessment: Generalized weakness (ankle DF MMT 5/5, hip flexion MMT 4-/5, all bilaterally)    Cervical / Trunk Assessment Cervical / Trunk Assessment: Normal  Communication   Communication Communication: No apparent difficulties    Cognition Arousal: Alert Behavior During Therapy: WFL for tasks assessed/performed   PT - Cognitive impairments: No apparent impairments                         Following commands: Intact       Cueing Cueing Techniques: Verbal cues, Visual cues     General Comments      Exercises     Assessment/Plan    PT Assessment Patient needs continued PT services;All further PT needs can be met in the next venue of care  PT Problem List Decreased strength;Decreased activity tolerance;Decreased range of motion;Decreased balance;Decreased mobility       PT Treatment Interventions DME instruction;Gait training;Stair training;Functional mobility training;Therapeutic activities;Therapeutic exercise;Patient/family education;Balance training    PT Goals (Current goals can be found in the Care Plan section)  Acute Rehab PT Goals Patient Stated Goal: return home PT Goal Formulation: With patient Time For Goal Achievement: 09/12/24 Potential to Achieve Goals: Good    Frequency Min 3X/week     Co-evaluation  AM-PAC PT 6 Clicks Mobility  Outcome Measure Help needed turning from your back to your side while in a flat bed without using bedrails?: A Little Help needed moving from lying on your back to sitting on the side of a flat bed without using bedrails?: A Lot Help needed moving to and from a bed to a chair (including a wheelchair)?: A Little Help needed standing up from a chair using your arms (e.g., wheelchair or bedside chair)?: A Little Help needed to walk in hospital room?:  A Lot Help needed climbing 3-5 steps with a railing? : A Lot 6 Click Score: 15    End of Session Equipment Utilized During Treatment: Gait belt Activity Tolerance: Patient tolerated treatment well;Patient limited by fatigue Patient left: with call bell/phone within reach;in bed   PT Visit Diagnosis: Unsteadiness on feet (R26.81);Other abnormalities of gait and mobility (R26.89);History of falling (Z91.81);Muscle weakness (generalized) (M62.81);Difficulty in walking, not elsewhere classified (R26.2)    Time: 8899-8877 PT Time Calculation (min) (ACUTE ONLY): 22 min   Charges:       PT General Charges $$ ACUTE PT VISIT: 1 Visit         2:01 PM, 08/29/2024 Krissi Willaims Powell-Butler, PT, DPT [place] with [facility]

## 2024-08-29 NOTE — Hospital Course (Signed)
 68 y.o. 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   Assessment and Plan:   Concern for severe sepsis - Fever, tachycardia, dyspnea, influenza A, hypotension given concern for severe sepsis.  Blood pressure responding well to IV fluid bolus.  No need for vasopressor medications at this time.  Blood cultures pending, empiric antibiotics on board.  Monitor blood pressure closely.   Acute influenza A with concern for bacterial superinfection - Rapid screen positive for influenza A.  Given recent hospitalization, coverage for HCAP empirically.  Currently on Rocephin , azithromycin , vancomycin .  MRSA PCR negative and minimal leukocytosis, will discontinue vancomycin .  Strep pneumo negative.  Pending sputum, Legionella.  Tamiflu  on board.  Monitor respiratory function closely.   Acute hypoxic respiratory failure - Secondary to influenza A.  Currently on 2 L nasal cannula.  Will attempt to wean O2 as tolerated.   Metastatic cholangiocarcinoma - Noted peritoneal carcinomatosis and metastatic disease to the liver and bile duct.  Follows with oncology in the outpatient setting.  Not receiving any recent chemotherapy due to multiple hospitalizations.   CAD/HLD - S/p LCx stent 10/25.  Continue Brilinta .  Aspirin  discontinued during recent hospitalization per cardiology recommendations.  Tolerated fenofibrate  as before.   Chronic HFpEF/HTN -Appear to be exacerbated.  Consider restarting antihypertensives now that volume resuscitated.   Diabetes mellitus - Insulin  sliding scale ordered.   BPH - Flomax  support.   Anxiety/depression - Resume Wellbutrin  300 mg daily.

## 2024-08-30 DIAGNOSIS — A419 Sepsis, unspecified organism: Secondary | ICD-10-CM | POA: Diagnosis not present

## 2024-08-30 DIAGNOSIS — R6520 Severe sepsis without septic shock: Secondary | ICD-10-CM | POA: Diagnosis not present

## 2024-08-30 DIAGNOSIS — J09X1 Influenza due to identified novel influenza A virus with pneumonia: Secondary | ICD-10-CM | POA: Diagnosis not present

## 2024-08-30 DIAGNOSIS — I10 Essential (primary) hypertension: Secondary | ICD-10-CM | POA: Diagnosis not present

## 2024-08-30 DIAGNOSIS — I25110 Atherosclerotic heart disease of native coronary artery with unstable angina pectoris: Secondary | ICD-10-CM | POA: Diagnosis not present

## 2024-08-30 DIAGNOSIS — C221 Intrahepatic bile duct carcinoma: Secondary | ICD-10-CM | POA: Diagnosis not present

## 2024-08-30 LAB — BASIC METABOLIC PANEL WITH GFR
Anion gap: 8 (ref 5–15)
BUN: 10 mg/dL (ref 8–23)
CO2: 25 mmol/L (ref 22–32)
Calcium: 7.6 mg/dL — ABNORMAL LOW (ref 8.9–10.3)
Chloride: 103 mmol/L (ref 98–111)
Creatinine, Ser: 1.07 mg/dL (ref 0.61–1.24)
GFR, Estimated: >60 mL/min
Glucose, Bld: 103 mg/dL — ABNORMAL HIGH (ref 70–99)
Potassium: 3.2 mmol/L — ABNORMAL LOW (ref 3.5–5.1)
Sodium: 135 mmol/L (ref 135–145)

## 2024-08-30 LAB — CBC
HCT: 26.6 % — ABNORMAL LOW (ref 39.0–52.0)
Hemoglobin: 8.2 g/dL — ABNORMAL LOW (ref 13.0–17.0)
MCH: 30.7 pg (ref 26.0–34.0)
MCHC: 30.8 g/dL (ref 30.0–36.0)
MCV: 99.6 fL (ref 80.0–100.0)
Platelets: 196 K/uL (ref 150–400)
RBC: 2.67 MIL/uL — ABNORMAL LOW (ref 4.22–5.81)
RDW: 20.7 % — ABNORMAL HIGH (ref 11.5–15.5)
WBC: 4.2 K/uL (ref 4.0–10.5)
nRBC: 0 % (ref 0.0–0.2)

## 2024-08-30 LAB — GLUCOSE, CAPILLARY
Glucose-Capillary: 366 mg/dL — ABNORMAL HIGH (ref 70–99)
Glucose-Capillary: 97 mg/dL (ref 70–99)
Glucose-Capillary: 135 mg/dL — ABNORMAL HIGH (ref 70–99)
Glucose-Capillary: 114 mg/dL — ABNORMAL HIGH (ref 70–99)

## 2024-08-30 MED ORDER — POTASSIUM CHLORIDE 20 MEQ PO PACK
40.0000 meq | PACK | Freq: Two times a day (BID) | ORAL | Status: AC
  Administered 2024-08-30 (×2): 40 meq via ORAL
  Filled 2024-08-30 (×2): qty 2

## 2024-08-30 MED ORDER — FUROSEMIDE 10 MG/ML IJ SOLN
40.0000 mg | Freq: Two times a day (BID) | INTRAMUSCULAR | Status: DC
  Administered 2024-08-30 – 2024-08-31 (×4): 40 mg via INTRAVENOUS
  Filled 2024-08-30 (×4): qty 4

## 2024-08-30 NOTE — Progress Notes (Signed)
 Patient had a dark tarry stool , MD informed and asked for an order for stool  occult blood, stool sample sent but lab later called and informed , the occult card was expired, so the stool sample needs to be recollected, waiting for patient to have a bowel movement. Expired cards gotten rid of, new cards replaced. MD made aware.

## 2024-08-30 NOTE — Progress Notes (Signed)
 " Progress Note   Patient: Marvin Carr FMW:[id number] DOB: [birth year] DOA: 08/28/2024  DOS: the patient was seen and examined on 08/30/2024   Brief hospital course:  68 y.o. 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   Assessment and Plan:   Concern for severe sepsis - Fever, tachycardia, dyspnea, influenza A, hypotension given concern for severe sepsis.  Blood pressure responding well to IV fluid bolus.  No need for vasopressor medications at this time.  Blood cultures pending, empiric antibiotics on board.  Monitor blood pressure closely.   Acute influenza A with concern for bacterial superinfection - Rapid screen positive for influenza A.  Given recent hospitalization, coverage for HCAP empirically.  Currently on Rocephin , azithromycin , vancomycin .  MRSA PCR negative and minimal leukocytosis, discontinued vancomycin .  Strep pneumo negative.  Pending sputum, Legionella.  Tamiflu  on board.  Monitor respiratory function closely.   Acute hypoxic respiratory failure - Secondary to influenza A.  Weaned off of nasal cannula today.  Still tachypneic but O2 sat in the 90s.  Metastatic cholangiocarcinoma - Noted peritoneal carcinomatosis and metastatic disease to the liver and bile duct.  Follows with oncology in the outpatient setting.  Not receiving any recent chemotherapy due to multiple hospitalizations.   CAD/HLD - S/p LCx stent 10/25.  Continue Brilinta .  Aspirin  discontinued during recent hospitalization per cardiology recommendations.  Tolerated fenofibrate  as before.   Chronic HFpEF/HTN -Appear to be exacerbated.  Consider restarting antihypertensives now that  volume resuscitated.   Diabetes mellitus - Insulin  sliding scale ordered.   BPH - Flomax  support.   Anxiety/depression - Resume Wellbutrin  300 mg daily.   Subjective: Patient sitting up in the bedside chair.  States he feels a bit better, no longer on nasal cannula.  Tachycardia resolved, still tachypneic however.  This patient admits to feeling weak as well.  Denies any fever, worsening shortness of breath, chest pain, nausea, vomiting, abdominal pain.  Physical Exam:  Vitals:   08/30/24 0500 08/30/24 0600 08/30/24 0700 08/30/24 0757  BP: (!) 106/51 116/66 113/73   Pulse: 84 88 87   Resp: (!) 26 (!) 24 (!) 30   Temp: 97.7 F (36.5 C)   97.6 F (36.4 C)  TempSrc: Oral   Oral  SpO2: 94% 94% 93%   Weight:      Height:        GENERAL:  Alert, pleasant, no acute distress, ill-appearing HEENT:  EOMI CARDIOVASCULAR: RRR RESPIRATORY: Dyspneic,, tachypneic, poor air movement GASTROINTESTINAL:  Soft, nontender, nondistended EXTREMITIES:  No LE edema bilaterally NEURO:  No new focal deficits appreciated SKIN:  No rashes noted PSYCH:  Appropriate mood and affect    Data Reviewed:  Imaging Studies: CT Angio Chest PE W and/or Wo Contrast Result Date: 08/28/2024 CLINICAL DATA:  Concern for pulmonary embolism. EXAM: CT ANGIOGRAPHY CHEST WITH CONTRAST TECHNIQUE: Multidetector CT imaging of the chest was performed using the standard protocol during bolus administration of intravenous contrast. Multiplanar CT image reconstructions and MIPs were obtained to evaluate the vascular anatomy. RADIATION DOSE REDUCTION: This exam was performed according to the departmental dose-optimization program which includes automated exposure control, adjustment of the mA and/or kV according to patient size and/or use of iterative reconstruction technique. CONTRAST:  75mL  OMNIPAQUE  IOHEXOL  350 MG/ML SOLN COMPARISON:  Chest radiograph dated 08/28/2024. FINDINGS: Cardiovascular: There is no cardiomegaly or  pericardial effusion. There is 3 vessel coronary vascular calcification. Mild atherosclerotic calcification of the thoracic aorta. No aneurysmal dilatation or dissection. The origins of the great vessels of the aortic arch appear patent. Right-sided Port-A-Cath with tip over central SVC. No pulmonary artery embolus identified. Mediastinum/Nodes: No hilar or mediastinal adenopathy. The esophagus is grossly unremarkable no mediastinal fluid collection. Lungs/Pleura: Small left pleural effusion with partial compressive atelectasis of the left lower lobe. Patchy area of streaky and nodular density in the left upper lobe and lingula consistent with pneumonia. Aspiration is not excluded. Additional reticulonodular density at the right lung base may represent atelectasis or infiltrate. No pneumothorax. The central airways are patent. Upper Abdomen: Partially visualized ascites Musculoskeletal: Degenerative changes of the spine. No acute osseous pathology. Review of the MIP images confirms the above findings. IMPRESSION: 1. No CT evidence of pulmonary artery embolus. 2. Left upper lobe and lingular pneumonia. Aspiration is not excluded. 3. Small left pleural effusion with partial compressive atelectasis of the left lower lobe. 4. Partially visualized ascites. 5.  Aortic Atherosclerosis (ICD10-I70.0). Electronically Signed   By: Vanetta Chou M.D.   On: 08/28/2024 18:17   DG Chest Port 1 View Result Date: 08/28/2024 CLINICAL DATA:  Questionable sepsis - evaluate for abnormality EXAM: PORTABLE CHEST - 1 VIEW COMPARISON:  07/16/2024 FINDINGS: Right chest port is similarly positioned terminating in the lower SVC. Unchanged trace left pleural effusion. Streaky left basilar atelectasis. No pneumothorax or new airspace consolidation. The cardiac silhouette is at the upper limits of normal, likely accentuated by AP technique. Aortic atherosclerosis. No acute fracture or destructive lesions. Multilevel thoracic osteophytosis.  IMPRESSION: Unchanged trace left pleural effusion with streaky left basilar atelectasis. Electronically Signed   By: Rogelia Myers M.D.   On: 08/28/2024 13:35   DG Abd Portable 1V Result Date: 08/19/2024 CLINICAL DATA:  Bowel obstruction. EXAM: PORTABLE ABDOMEN - 1 VIEW COMPARISON:  08/18/2024 FINDINGS: NG tube tip is in the proximal stomach. Although side port of the NG tube is not clearly visualized, it is likely in the distal esophagus. Gaseous distention of transverse colon noted without frank dilatation. Gas is visible in the nondistended rectum. Air is seen scattered through nondilated small bowel loops. IMPRESSION: 1. NG tube tip is in the proximal stomach. Although side port of the NG tube is not clearly visualized, it is likely in the distal esophagus. 2. Nonspecific bowel gas pattern. Electronically Signed   By: Camellia Candle M.D.   On: 08/19/2024 07:21   DG Abd 1 View Result Date: 08/18/2024 CLINICAL DATA:  Enteric catheter placement EXAM: ABDOMEN - 1 VIEW COMPARISON:  08/17/2024 FINDINGS: Frontal view of the lower chest and upper abdomen demonstrates stable position of the enteric catheter, tip projecting over the gastric fundus. Side port projects proximally 2.5 cm above the left hemidiaphragm. Bowel gas pattern is unremarkable. Stable left pleural effusion and left lower lobe consolidation. IMPRESSION: 1. Enteric catheter tip projecting over the gastric fundus, with side port projecting 2.5 cm above the left hemidiaphragm. 2. Stable left pleural effusion and left basilar consolidation. Electronically Signed   By: Ozell Daring M.D.   On: 08/18/2024 17:10   DG Abd 1 View Result Date: 08/17/2024 EXAM: 1 VIEW XRAY OF THE ABDOMEN 08/17/2024 12:28:00 AM COMPARISON: None available. CLINICAL HISTORY: Encounter for nasogastric (NG) tube placement. FINDINGS: LINES, TUBES AND DEVICES: Enteric tube courses below the hemidiaphragm with the  tip overlying the gastric lumen and side port not well  visualized, likely in the region of the gastroesophageal junction. BOWEL: Nonobstructive bowel gas pattern. SOFT TISSUES: No abnormal calcifications. BONES: No acute fracture. LIMITATIONS: Right flank and pelvis collimated off view. IMPRESSION: 1. Enteric tube tip overlies the gastric lumen; side port is not well visualized and is likely at the gastroesophageal junction, consider advancing the tube slightly to ensure the side port is intragastric. Electronically signed by: Morgane Naveau MD 08/17/2024 12:33 AM EST RP Workstation: [id number]   CT ANGIO GI BLEED Result Date: 08/16/2024 EXAM: CTA ABDOMEN AND PELVIS WITH CONTRAST 08/16/2024 03:23:46 PM TECHNIQUE: CTA images of the abdomen and pelvis with intravenous contrast. 100 mL iohexol  (OMNIPAQUE ) 350 MG/ML injection. Three-dimensional MIP/volume rendered formations were performed. Automated exposure control, iterative reconstruction, and/or weight based adjustment of the mA/kV was utilized to reduce the radiation dose to as low as reasonably achievable. COMPARISON: 05/15/2024 and 06/03/2024. CLINICAL HISTORY: Suspected GI bleed. FINDINGS: VASCULATURE: GI BLEED: No active extravasation of contrast within the GI tract. AORTA: No acute finding. No abdominal aortic aneurysm. No dissection. CELIAC TRUNK: No acute finding. No occlusion or significant stenosis. SUPERIOR MESENTERIC ARTERY: No acute finding. No occlusion or significant stenosis. INFERIOR MESENTERIC ARTERY: No acute finding. No occlusion or significant stenosis. RENAL ARTERIES: No acute finding. No occlusion or significant stenosis. ILIAC ARTERIES: No acute finding. No occlusion or significant stenosis. ABDOMEN/PELVIS: LOWER CHEST: Visualized portion of the lower chest demonstrates no acute abnormality. LIVER: Hepatic low density is again noted consistent with metastatic disease. GALLBLADDER AND BILE DUCTS: Cholelithiasis. No biliary ductal dilatation. SPLEEN: The spleen is unremarkable. PANCREAS: The  pancreas is unremarkable. ADRENAL GLANDS: Bilateral adrenal glands demonstrate no acute abnormality. KIDNEYS, URETERS AND BLADDER: No stones in the kidneys or ureters. No hydronephrosis. No perinephric or periureteral stranding. Urinary bladder is unremarkable. GI AND BOWEL: Severely dilated small bowel loops are noted with intramural gas in multiple small bowel loops consistent with ischemic bowel disease. No colonic dilatation is noted. Stomach and duodenal sweep demonstrate no acute abnormality. REPRODUCTIVE: Reproductive organs are unremarkable. PERITONEUM AND RETROPERITONEUM: Mild ascites is noted. 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 No free air. LYMPH NODES: No lymphadenopathy. BONES AND SOFT TISSUES: Status post bilateral hip arthroplasties. No acute abnormality of the bones. No acute soft tissue abnormality. IMPRESSION: 1. Severely dilated small bowel loops with intramural gas, consistent with ischemic bowel disease. This finding was discussed with Dr. Melvenia at 3:38 pm on 08/16/2024. 2. No active gastrointestinal bleeding identified. 3. Hepatic low-density lesions consistent with metastatic disease. 4. 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 Electronically signed by: Lynwood Seip MD 08/16/2024 03:40 PM EST RP Workstation: [id number]   US  ASCITES (ABDOMEN LIMITED) Result Date: 08/15/2024 CLINICAL DATA:  Patient with history of cholangiocarcinoma with malignant ascites, recently started on diuretic medication. Request for possible therapeutic paracentesis. EXAM: LIMITED ABDOMEN ULTRASOUND FOR ASCITES TECHNIQUE: Limited ultrasound survey for ascites was performed in all four abdominal quadrants. COMPARISON:  None Available. FINDINGS: Limited abdominal ultrasound of all four abdominal quadrants shows very small midline right upper quadrant pocket of ascites. No other areas of peritoneal fluid seen. IMPRESSION: Very small midline right upper  quadrant pocket of ascites, not amenable to safe paracentesis. No procedure performed. Electronically Signed   By: Wilkie Lent M.D.   On: 08/15/2024 14:08    There are no new results to review at this time.  Previous records (including but not limited to H&P, progress notes, nursing notes, TOC  management) were reviewed in assessment of this patient.  Labs: CBC: Recent Labs  Lab 08/28/24 1211 08/28/24 1334 08/29/24 0459 08/30/24 0250  WBC 10.2 9.8 5.4 4.2  NEUTROABS 8.6* 8.5*  --   --   HGB 9.4* 10.8* 8.6* 8.2*  HCT 30.1* 34.4* 27.3* 26.6*  MCV 99.7 100.0 98.9 99.6  PLT 235 272 213 196   Basic Metabolic Panel: Recent Labs  Lab 08/28/24 1211 08/28/24 1334 08/29/24 0459 08/30/24 0250  NA 135 135 135 135  K 3.8 3.7 3.5 3.2*  CL 101 99 101 103  CO2 16* 18* 25 25  GLUCOSE 127* 92 89 103*  BUN 14 14 11 10   CREATININE 1.59* 1.53* 1.26* 1.07  CALCIUM  8.3* 8.8* 7.8* 7.6*   Liver Function Tests: Recent Labs  Lab 08/28/24 1211 08/28/24 1334  AST 126* 144*  ALT 33 38  ALKPHOS 147* 173*  BILITOT 0.3 0.4  PROT 5.5* 6.3*  ALBUMIN  2.7* 3.1*   CBG: Recent Labs  Lab 08/29/24 1609 08/29/24 2134 08/30/24 0750 08/30/24 0752 08/30/24 1145  GLUCAP 122* 113* 366* 97 135*    Scheduled Meds:  buPROPion   300 mg Oral Daily   Chlorhexidine  Gluconate Cloth  6 each Topical Q0600   furosemide   40 mg Intravenous Q12H   heparin   5,000 Units Subcutaneous Q8H   midodrine   5 mg Oral TID WC   oseltamivir   75 mg Oral BID   pantoprazole   40 mg Oral Daily   potassium chloride   40 mEq Oral BID   pramipexole   0.75 mg Oral TID   tamsulosin   0.4 mg Oral Daily   ticagrelor   90 mg Oral BID   Continuous Infusions:  azithromycin  Stopped (08/29/24 2226)   cefTRIAXone  (ROCEPHIN )  IV Stopped (08/29/24 1414)   PRN Meds:.chlorproMAZINE , diphenoxylate -atropine , hydrALAZINE , zolpidem   Family Communication: Not at bedside  Disposition: Status is: Inpatient Remains inpatient  appropriate because: Sepsis, flu A     Time spent: 41 minutes  Length of inpatient stay: 2 days  Author: Carliss LELON Canales, DO 08/30/2024 12:11 PM  For on call review [email or web address].   "

## 2024-08-30 NOTE — Plan of Care (Signed)

## 2024-08-30 NOTE — NC FL2 (Signed)
 "   MEDICAID FL2 LEVEL OF CARE FORM     IDENTIFICATION  Patient Name: Marvin Carr Birthdate: 1956-04-05 Sex: male Admission Date (Current Location): 08/28/2024  [facility] and [place] Number:  [employer] and Address:  [facility],  [address], [place] [address]      Provider Number: [id number]  Attending Physician Name and Address:  Arlon Carliss ORN, DO  Relative Name and Phone Number:       Current Level of Care: Hospital Recommended Level of Care: Skilled Nursing Facility Prior Approval Number:    Date Approved/Denied:   PASRR Number: [id number]  Discharge Plan: SNF    Current Diagnoses: Patient Active Problem List   Diagnosis Date Noted   Influenza A with pneumonia 08/29/2024   Severe sepsis (HCC) 08/28/2024   Ischemic bowel disease 08/16/2024   Ischemic bowel syndrome 08/16/2024   Costochondral chest pain 07/17/2024   S/P primary angioplasty with coronary stent 07/17/2024   Unstable angina (HCC) 07/12/2024   Chest pain 07/03/2024   Anxiety    IDA (iron deficiency anemia) 05/09/2024   Malignant ascites (HCC) 05/09/2024   Dry cough 05/09/2024   Hypokalemia 05/09/2024   Hypomagnesemia 05/09/2024   Insomnia 05/09/2024   S/p revision of left total hip 01/15/2024   S/P total left hip arthroplasty 01/15/2024   DOE (dyspnea on exertion) 09/25/2023   Chemotherapy-induced peripheral neuropathy 07/03/2023   Nocturnal hypoxia 04/06/2023   Neuropathy associated with cancer (HCC) 02/22/2023   Edema due to hypoalbuminemia 02/22/2023   RLS (restless legs syndrome) 02/22/2023   History of insomnia 02/22/2023   History of restless legs syndrome 02/22/2023   Chronic skin ulcer (HCC) 02/22/2023   OSA (obstructive sleep apnea) 01/29/2023   Fever 06/04/2022   Sepsis due to cellulitis (HCC) 06/04/2022   Morbid obesity due to excess calories (HCC)    Depression    Pre-diabetes    Forgetfulness 03/29/2022   Port-A-Cath in place  09/30/2021   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   Lesion of liver 09/19/2021   Peritoneal lesion 09/19/2021   Constipation 09/19/2021   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   Degeneration of intervertebral disc of lumbar region 09/01/2021   Neuropathy    CAD (coronary artery disease), native coronary artery 03/30/2013   Essential hypertension 03/30/2013   Mixed hyperlipidemia 03/30/2013    Orientation RESPIRATION BLADDER Height & Weight     Self, Situation, Time, Place  Normal External catheter Weight: 262 lb 9.1 oz (119.1 kg) Height:  6' (182.9 cm)  BEHAVIORAL SYMPTOMS/MOOD NEUROLOGICAL BOWEL NUTRITION STATUS      Continent Diet (Soft)  AMBULATORY STATUS COMMUNICATION OF NEEDS Skin   Extensive Assist Verbally Normal                       Personal Care Assistance Level of Assistance  Bathing, Feeding, Dressing Bathing Assistance: Maximum assistance Feeding assistance: Independent Dressing Assistance: Maximum assistance     Functional Limitations Info  Sight, Hearing, Speech Sight Info: Adequate Hearing Info: Adequate Speech Info: Adequate    SPECIAL CARE FACTORS FREQUENCY  PT (By licensed PT), OT (By licensed OT)     PT Frequency: 5 x a week OT Frequency: 5 x a week            Contractures Contractures Info: Not present    Additional Factors Info  Code Status, Allergies Code Status Info: DNR-Limite Allergies Info: NKA           Current Medications (  08/30/2024):  This is the current hospital active medication list Current Facility-Administered Medications  Medication Dose Route Frequency Provider Last Rate Last Admin   azithromycin  (ZITHROMAX ) 500 mg in sodium chloride  0.9 % 250 mL IVPB  500 mg Intravenous Q24H Elgergawy, Dawood S, MD   Stopped at 08/29/24 2226   buPROPion  (WELLBUTRIN  XL) 24 hr tablet 300 mg  300 mg Oral Daily Elgergawy, Dawood S, MD   300 mg at 08/30/24 0840   cefTRIAXone  (ROCEPHIN ) 2 g in sodium chloride   0.9 % 100 mL IVPB  2 g Intravenous Q24H Elgergawy, Dawood S, MD 200 mL/hr at 08/30/24 1317 2 g at 08/30/24 1317   Chlorhexidine  Gluconate Cloth 2 % PADS 6 each  6 each Topical Q0600 Elgergawy, Brayton RAMAN, MD   6 each at 08/30/24 [phone]   chlorproMAZINE  (THORAZINE ) tablet 25 mg  25 mg Oral TID PRN Elgergawy, Dawood S, MD       diphenoxylate -atropine  (LOMOTIL ) 2.5-0.025 MG per tablet 1 tablet  1 tablet Oral QID PRN Elgergawy, Brayton RAMAN, MD       furosemide  (LASIX ) injection 40 mg  40 mg Intravenous Q12H Arlon Honey W, DO   40 mg at 08/30/24 1032   heparin  injection 5,000 Units  5,000 Units Subcutaneous Q8H Elgergawy, Dawood S, MD   5,000 Units at 08/30/24 1317   hydrALAZINE  (APRESOLINE ) injection 5 mg  5 mg Intravenous Q4H PRN Elgergawy, Dawood S, MD       midodrine  (PROAMATINE ) tablet 5 mg  5 mg Oral TID WC Elgergawy, Dawood S, MD   5 mg at 08/30/24 1317   oseltamivir  (TAMIFLU ) capsule 75 mg  75 mg Oral BID Elgergawy, Dawood S, MD   75 mg at 08/30/24 0840   pantoprazole  (PROTONIX ) EC tablet 40 mg  40 mg Oral Daily Elgergawy, Dawood S, MD   40 mg at 08/30/24 0840   potassium chloride  (KLOR-CON ) packet 40 mEq  40 mEq Oral BID Arlon Honey ORN, DO   40 mEq at 08/30/24 1032   pramipexole  (MIRAPEX ) tablet 0.75 mg  0.75 mg Oral TID Elgergawy, Dawood S, MD   0.75 mg at 08/30/24 1033   tamsulosin  (FLOMAX ) capsule 0.4 mg  0.4 mg Oral Daily Elgergawy, Dawood S, MD   0.4 mg at 08/30/24 0840   ticagrelor  (BRILINTA ) tablet 90 mg  90 mg Oral BID Elgergawy, Dawood S, MD   90 mg at 08/30/24 0840   zolpidem  (AMBIEN ) tablet 5 mg  5 mg Oral QHS PRN Elgergawy, Dawood S, MD   5 mg at 08/29/24 2114   Facility-Administered Medications Ordered in Other Encounters  Medication Dose Route Frequency Provider Last Rate Last Admin   magnesium  sulfate 2 GM/50ML IVPB              Discharge Medications: Please see discharge summary for a list of discharge medications.  Relevant Imaging Results:  Relevant Lab  Results:   Additional Information SS#: [id number]  Noreen KATHEE Pinal, LCSWA     "

## 2024-08-30 NOTE — TOC Progression Note (Signed)
 Transition of Care [facility]) - Progression Note    Patient Details  Name: Marvin Carr MRN: [id number] Date of Birth: 01-03-1956  [facility] CM/SW Contact  Noreen KATHEE Pinal, [place] Phone Number: 08/30/2024, 4:09 PM  Clinical Narrative:     CSW spoke with patient at bedside about PT recommendation for SNF. Patient agreeable , but is going to check with his Medicare policy on [facility] care services in the home. Referral process has been started for [facility]. ICM will continue to follow.    Expected Discharge Plan: Skilled Nursing Facility Barriers to Discharge: Continued Medical Work up    Expected Discharge Plan and Services     Post Acute Care Choice: Durable Medical Equipment Living arrangements for the past 2 months: Single Family Home                    Social Drivers of Health (SDOH) Interventions SDOH Screenings   Food Insecurity: No Food Insecurity (08/28/2024)  Housing: Low Risk (08/28/2024)  Transportation Needs: No Transportation Needs (08/28/2024)  Utilities: Not At Risk (08/28/2024)  Depression (PHQ2-9): Low Risk (08/25/2024)  Recent Concern: Depression (PHQ2-9) - Medium Risk (08/13/2024)  Social Connections: Moderately Isolated (08/28/2024)  Tobacco Use: Medium Risk (08/28/2024)    Readmission Risk Interventions    08/30/2024    4:08 PM 08/29/2024   12:26 PM 07/03/2024   10:21 AM  Readmission Risk Prevention Plan  Transportation Screening Complete Complete Complete  HRI or Home Care Consult   Complete  Social Work Consult for Recovery Care Planning/Counseling   Complete  Palliative Care Screening   Not Applicable  Medication Review [employer]) Complete Complete Complete  HRI or Home Care Consult Complete Complete   SW Recovery Care/Counseling Consult Complete Complete   Palliative Care Screening Not Applicable Not Applicable   Skilled Nursing Facility Complete Not Complete   SNF Comments Agreeable to SNF Pending PT consult

## 2024-08-31 ENCOUNTER — Encounter: Payer: Self-pay | Admitting: Oncology

## 2024-08-31 DIAGNOSIS — J09X1 Influenza due to identified novel influenza A virus with pneumonia: Secondary | ICD-10-CM

## 2024-08-31 DIAGNOSIS — A419 Sepsis, unspecified organism: Secondary | ICD-10-CM | POA: Diagnosis not present

## 2024-08-31 DIAGNOSIS — C221 Intrahepatic bile duct carcinoma: Secondary | ICD-10-CM | POA: Diagnosis not present

## 2024-08-31 DIAGNOSIS — R6520 Severe sepsis without septic shock: Secondary | ICD-10-CM

## 2024-08-31 DIAGNOSIS — I10 Essential (primary) hypertension: Secondary | ICD-10-CM

## 2024-08-31 LAB — CBC
HCT: 30.5 % — ABNORMAL LOW (ref 39.0–52.0)
Hemoglobin: 9.5 g/dL — ABNORMAL LOW (ref 13.0–17.0)
MCH: 30.8 pg (ref 26.0–34.0)
MCHC: 31.1 g/dL (ref 30.0–36.0)
MCV: 99 fL (ref 80.0–100.0)
Platelets: 212 K/uL (ref 150–400)
RBC: 3.08 MIL/uL — ABNORMAL LOW (ref 4.22–5.81)
RDW: 20.2 % — ABNORMAL HIGH (ref 11.5–15.5)
WBC: 5.5 K/uL (ref 4.0–10.5)
nRBC: 0 % (ref 0.0–0.2)

## 2024-08-31 LAB — BASIC METABOLIC PANEL WITH GFR
Anion gap: 9 (ref 5–15)
BUN: 9 mg/dL (ref 8–23)
CO2: 27 mmol/L (ref 22–32)
Calcium: 8 mg/dL — ABNORMAL LOW (ref 8.9–10.3)
Chloride: 102 mmol/L (ref 98–111)
Creatinine, Ser: 1.12 mg/dL (ref 0.61–1.24)
GFR, Estimated: >60 mL/min
Glucose, Bld: 87 mg/dL (ref 70–99)
Potassium: 3.9 mmol/L (ref 3.5–5.1)
Sodium: 138 mmol/L (ref 135–145)

## 2024-08-31 LAB — GLUCOSE, CAPILLARY
Glucose-Capillary: 86 mg/dL (ref 70–99)
Glucose-Capillary: 108 mg/dL — ABNORMAL HIGH (ref 70–99)
Glucose-Capillary: 122 mg/dL — ABNORMAL HIGH (ref 70–99)
Glucose-Capillary: 106 mg/dL — ABNORMAL HIGH (ref 70–99)

## 2024-08-31 LAB — LEGIONELLA PNEUMOPHILA SEROGP 1 UR AG: L. pneumophila Serogp 1 Ur Ag: NEGATIVE

## 2024-08-31 MED ORDER — FENOFIBRATE 160 MG PO TABS
160.0000 mg | ORAL_TABLET | Freq: Every day | ORAL | Status: DC
  Administered 2024-08-31 – 2024-09-06 (×7): 160 mg via ORAL
  Filled 2024-08-31 (×7): qty 1

## 2024-08-31 MED ORDER — NIFEDIPINE ER OSMOTIC RELEASE 30 MG PO TB24
30.0000 mg | ORAL_TABLET | Freq: Every day | ORAL | Status: DC
  Administered 2024-08-31 – 2024-09-02 (×3): 30 mg via ORAL
  Filled 2024-08-31 (×3): qty 1

## 2024-08-31 MED ORDER — FAMOTIDINE 20 MG PO TABS
20.0000 mg | ORAL_TABLET | Freq: Every day | ORAL | Status: DC
  Administered 2024-08-31 – 2024-09-05 (×6): 20 mg via ORAL
  Filled 2024-08-31 (×6): qty 1

## 2024-08-31 MED ORDER — GUAIFENESIN-DM 100-10 MG/5ML PO SYRP: 5.0000 mL | ORAL_SOLUTION | ORAL | Status: DC | PRN

## 2024-08-31 MED ORDER — ATORVASTATIN CALCIUM 20 MG PO TABS
20.0000 mg | ORAL_TABLET | Freq: Every day | ORAL | Status: DC
  Administered 2024-08-31 – 2024-09-04 (×5): 20 mg via ORAL
  Filled 2024-08-31 (×5): qty 1

## 2024-08-31 MED ORDER — TRAZODONE HCL 50 MG PO TABS
50.0000 mg | ORAL_TABLET | Freq: Every evening | ORAL | Status: DC | PRN
  Administered 2024-08-31: 50 mg via ORAL
  Filled 2024-08-31: qty 1

## 2024-08-31 MED ORDER — DIPHENHYDRAMINE HCL 25 MG PO CAPS
25.0000 mg | ORAL_CAPSULE | Freq: Four times a day (QID) | ORAL | Status: DC | PRN
  Administered 2024-08-31: 25 mg via ORAL
  Filled 2024-08-31: qty 1

## 2024-08-31 MED ORDER — NITROGLYCERIN 0.4 MG SL SUBL: 0.4000 mg | SUBLINGUAL_TABLET | SUBLINGUAL | Status: DC | PRN

## 2024-08-31 NOTE — Plan of Care (Signed)
   Problem: Activity: Goal: Risk for activity intolerance will decrease Outcome: Progressing   Problem: Coping: Goal: Level of anxiety will decrease Outcome: Progressing

## 2024-08-31 NOTE — Hospital Course (Addendum)
 68 y.o. 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 -He presented to ED secondary to dyspnea, lysed weakness, fatigue, he denies chest pain, reports orthopnea, cough, nonproductive, fever and chills at home, he has diarrhea which is chronic, forts weakness during ambulation yesterday where he felt his legs gave up or he had a fall, had oncology follow-up appointment today where he was noted to be febrile 100.3, tachypneic and tachycardic so he was brought to ED for further evaluation. -In ED workup significant for Lones AF pneumonia, CTA chest negative for PE but significant for multifocal pneumonia, labs were significant for Creatinine, elevated lactic acid at 4.1, improved with IV fluids, his respiratory panel positive for influenza A, negative UA, [facility] hospitalist consulted to admit. The patient was started on oseltamavir and bronchodilators.  He was felt to have a degree of fluid overload and started on IV furosemide  for period of 3 days.  The patient was started on ceftriaxone  and azithromycin  which she received for 5 days.  He improved clinically with resolution of his sepsis physiology. Unfortunately, patient began having increasing abdominal distention, abdominal pain, and vomiting.  CT abdomen and pelvis confirmed partial small bowel obstruction.  General surgery was consulted.  The patient was not felt to be a surgical candidate.  IR was consulted for consideration of  palliative gastrostomy tube placement for venting.  After discussion with IR, they felt the patient was a poor candidate for any further intervention.  Palliative medicine was consulted.  Goals of care were discussed with the patient.  The patient continued to deteriorate with increasing abdominal pain, increasing abdominal distention, and vomiting.  The patient was transition to DNR/DNI. NG tube was attempted, numerous times without success. On morning 09-27-2024, further goals of care discussions were held with the patient.  The patient confirmed DNR/DNI.  He stated that he did not want any further invasive interventions or radiographic studies.  He did not want to be transferred to another hospital at this point.  He was agreeable to continued antibiotics, fluids, and other medications as indicated.  Later in the day, further GOC discussions were held with patient with aunt and cousin at bedside. I met the patient's aunt [name] Croak and cousin [name] around 1:55 PM for goals of care discussion at the bedside. I briefly summarized the patient's care during this hospitalization to his family. We discussed what Mr. Bugarin would want should his health continue to deteriorate.  It was clear that Mr. Bonn went to be free from any pain and suffering. We discussed that Mr. Musich has  felt the good fight, and he states that he is now feeling tired and weak.  Mr. Atiyeh feels that he is given all his will and offered infighting this cancer.   We discussed his overall poor prognosis due to the progression of his cancer, severity of his acute medical illness, and his several comorbidities.  We discussed that he is no longer a candidate for any operative or invasive intervention, and that such intervention would only further create harm,  pain, and suffering. We discussed that continue medical therapy at this point would only continue to prolong his pain and suffering and would not alter the natural outcome  of his metastatic cancer.  We discussed at that even if he were stable enough to go home, he would have a repeat hospitalization. During the entire conversation, the patient was clearly more dyspneic and was complaining of nausea and wanted to throw up.   We discussed comfort care measures and palliation and what that would look like for the patient.  We discussed on burning him from his many medications as well as from further blood draws and diagnostic studies.  I told the patient that I would ensure that he would not struggle to breathe or suffer from any further pain.   The patient's aunt and niece stated that they did not want the patient to have any further suffering or pain.  Ultimately, the patient stated that he did not want any further blood draws, diagnostic studies, or any invasive procedures.  He wanted to focus more on comfort to relieve his pain and shortness of breath.  He agreed to transition to full comfort measures.  The patient's cousin and aunt also agreed with the transition to full comfort measures.  Subsequently, his medications with curative intent were discontinued.  The patient was started on Dilaudid  and Ativan .

## 2024-08-31 NOTE — Progress Notes (Signed)
 " PROGRESS NOTE   Marvin Carr  FMW:[id number] DOB: [birth year] DOA: 08/28/2024 PCP: [name], MD   Chief Complaint  Patient presents with   Shortness of Breath   Level of care: Telemetry  Brief Admission History:  68 y.o. 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 -He presented to ED secondary to dyspnea, lysed weakness, fatigue, he denies chest pain, reports orthopnea, cough, nonproductive, fever and chills at home, he has diarrhea which is chronic, forts weakness during ambulation yesterday where he felt his legs gave up or he had a fall, had oncology follow-up appointment today where he was noted to be febrile 100.3, tachypneic and tachycardic so he was brought to ED for further evaluation. -In ED workup significant for Lones AF pneumonia, CTA chest negative for PE but significant for multifocal pneumonia, labs were significant for Creatinine, elevated lactic acid at 4.1, improved with IV fluids, his respiratory panel positive for influenza A, negative UA, [facility] hospitalist consulted to admit.   Assessment and Plan:  severe sepsis -- RESOLVED  - Fever, tachycardia, dyspnea, influenza A, hypotension given concern for severe sepsis.  Blood pressure responding well to IV fluid bolus.  No need for vasopressor medications at this time.  Blood cultures NGTD, empiric antibiotics on board.  Monitor blood pressure closely.   Acute influenza A -- IMPROVING  -  Rapid screen positive for influenza A.  Given recent hospitalization, coverage for HCAP empirically.  Currently on Rocephin , azithromycin , vancomycin .  MRSA PCR negative and minimal leukocytosis, discontinued vancomycin .  Strep pneumo negative.  Pending sputum, Legionella.  Tamiflu  on board.  Monitor respiratory function closely.   Acute hypoxic respiratory failure -- RESOLVED  - Secondary to influenza A.  Weaned to room air.     Metastatic cholangiocarcinoma - Noted peritoneal carcinomatosis and metastatic disease to the liver and bile duct.  Follows with oncology in the outpatient setting.  Not receiving any recent chemotherapy due to multiple hospitalizations.   CAD/HLD - S/p LCx stent 10/25.  Continue Brilinta .  Aspirin  discontinued during recent hospitalization per cardiology recommendations.  Tolerated fenofibrate  as before.   Chronic HFpEF/HTN -Appear to be exacerbated.  Consider restarting antihypertensives now that volume resuscitated.   Diabetes mellitus - Insulin  sliding scale ordered.  CBG (last 3)  Recent Labs    08/31/24 0737 08/31/24 1137 08/31/24 1615  GLUCAP 86 108* 122*   BPH - Flomax  support.   Anxiety/depression - Resume Wellbutrin  300 mg daily.  DVT prophylaxis: sq heparin  Code Status:  Family Communication:  Disposition:    Consultants:   Procedures:   Antimicrobials:    Subjective: Pt reports his breathing continues to improve with less SOB.    Objective: Vitals:   08/31/24 1035 08/31/24 1100 08/31/24 1400 08/31/24 1431  BP: 132/84  113/75 113/75  Pulse: 98  91 91  Resp: 20  20 18   Temp: 97.8 F (36.6 C)  98.2 F (36.8 C) 98.2 F (36.8 C)  TempSrc: Oral  Oral Oral  SpO2: 95%  92% 92%  Weight:  116.2  kg    Height:  6' (1.829 m)      Intake/Output Summary (Last 24 hours) at 08/31/2024 1745 Last data filed at 08/31/2024 1300 Gross per 24 hour  Intake 1930 ml  Output 2150 ml  Net -220 ml   Filed Weights   08/28/24 1247 08/28/24  2248 08/31/24 1100  Weight: 115.7 kg 119.1 kg 116.2 kg   Examination:  General exam: Appears calm and comfortable  Respiratory system: shallow BS but clear, no increased work of breathing.  Cardiovascular system: normal S1 & S2 heard. No JVD, murmurs, rubs, gallops or clicks. No pedal edema. Gastrointestinal system: Abdomen is nondistended, soft and nontender. No organomegaly or masses felt. Normal bowel sounds heard. Central nervous system: Alert and oriented. No focal neurological deficits. Extremities: Symmetric 5 x 5 power. Skin: No rashes, lesions or ulcers. Psychiatry: Judgement and insight appear normal. Mood & affect appropriate.   Data Reviewed: I have personally reviewed following labs and imaging studies  CBC: Recent Labs  Lab 08/28/24 1211 08/28/24 1334 08/29/24 0459 08/30/24 0250 08/31/24 0423  WBC 10.2 9.8 5.4 4.2 5.5  NEUTROABS 8.6* 8.5*  --   --   --   HGB 9.4* 10.8* 8.6* 8.2* 9.5*  HCT 30.1* 34.4* 27.3* 26.6* 30.5*  MCV 99.7 100.0 98.9 99.6 99.0  PLT 235 272 213 196 212    Basic Metabolic Panel: Recent Labs  Lab 08/28/24 1211 08/28/24 1334 08/29/24 0459 08/30/24 0250 08/31/24 0423  NA 135 135 135 135 138  K 3.8 3.7 3.5 3.2* 3.9  CL 101 99 101 103 102  CO2 16* 18* 25 25 27   GLUCOSE 127* 92 89 103* 87  BUN 14 14 11 10 9   CREATININE 1.59* 1.53* 1.26* 1.07 1.12  CALCIUM  8.3* 8.8* 7.8* 7.6* 8.0*    CBG: Recent Labs  Lab 08/30/24 1145 08/30/24 1635 08/31/24 0737 08/31/24 1137 08/31/24 1615  GLUCAP 135* 114* 86 108* 122*    Recent Results (from the past 240 hours)  Resp panel by RT-PCR (RSV, Flu A&B, Covid) Anterior Nasal Swab     Status: Abnormal   Collection Time: 08/28/24  1:21 PM   Specimen: Anterior Nasal Swab  Result Value Ref Range Status   SARS Coronavirus 2 by RT PCR NEGATIVE NEGATIVE Final    Comment: (NOTE) SARS-CoV-2 target nucleic acids are NOT DETECTED.  The SARS-CoV-2 RNA is generally detectable in upper  respiratory specimens during the acute phase of infection. The lowest concentration of SARS-CoV-2 viral copies this assay can detect is 138 copies/mL. A negative result does not preclude SARS-Cov-2 infection and should not be used as the sole basis for treatment or other patient management decisions. A negative result may occur with  improper specimen collection/handling, submission of specimen other than nasopharyngeal swab, presence of viral mutation(s) within the areas targeted by this assay, and inadequate number of viral copies(<138 copies/mL). A negative result must be combined with clinical observations, patient history, and epidemiological information. The expected result is Negative.  Fact Sheet for Patients:  bloggercourse.com  Fact Sheet for Healthcare Providers:  seriousbroker.it  This test is no t yet approved or cleared by the [place]  FDA and  has been authorized for detection and/or diagnosis of SARS-CoV-2 by FDA under an Emergency Use Authorization (EUA). This EUA will remain  in effect (meaning this test can be used) for the duration of the COVID-19 declaration under Section 564(b)(1) of the Act, 21 U.S.C.section 360bbb-3(b)(1), unless the authorization is terminated  or revoked sooner.  Influenza A by PCR POSITIVE (A) NEGATIVE Final   Influenza B by PCR NEGATIVE NEGATIVE Final    Comment: (NOTE) The Xpert Xpress SARS-CoV-2/FLU/RSV plus assay is intended as an aid in the diagnosis of influenza from Nasopharyngeal swab specimens and should not be used as a sole basis for treatment. Nasal washings and aspirates are unacceptable for Xpert Xpress SARS-CoV-2/FLU/RSV testing.  Fact Sheet for Patients: bloggercourse.com  Fact Sheet for Healthcare Providers: seriousbroker.it  This test is not yet approved or cleared by the [place]  FDA and has been  authorized for detection and/or diagnosis of SARS-CoV-2 by FDA under an Emergency Use Authorization (EUA). This EUA will remain in effect (meaning this test can be used) for the duration of the COVID-19 declaration under Section 564(b)(1) of the Act, 21 U.S.C. section 360bbb-3(b)(1), unless the authorization is terminated or revoked.     Resp Syncytial Virus by PCR NEGATIVE NEGATIVE Final    Comment: (NOTE) Fact Sheet for Patients: bloggercourse.com  Fact Sheet for Healthcare Providers: seriousbroker.it  This test is not yet approved or cleared by the [place]  FDA and has been authorized for detection and/or diagnosis of SARS-CoV-2 by FDA under an Emergency Use Authorization (EUA). This EUA will remain in effect (meaning this test can be used) for the duration of the COVID-19 declaration under Section 564(b)(1) of the Act, 21 U.S.C. section 360bbb-3(b)(1), unless the authorization is terminated or revoked.  Performed at [facility], [address] [place], [place] [address]   C Difficile Quick Screen w PCR reflex     Status: None   Collection Time: 08/28/24  1:22 PM   Specimen: STOOL  Result Value Ref Range Status   C Diff antigen NEGATIVE NEGATIVE Final   C Diff toxin NEGATIVE NEGATIVE Final   C Diff interpretation No C. difficile detected.  Final    Comment: Performed at [facility], [address] [place], [place] [address]  Culture, blood (Routine x 2)     Status: None (Preliminary result)   Collection Time: 08/28/24  1:34 PM   Specimen: BLOOD  Result Value Ref Range Status   Specimen Description BLOOD BLOOD RIGHT ARM  Final   Special Requests   Final    BOTTLES DRAWN AEROBIC AND ANAEROBIC Blood Culture adequate volume   Culture   Final    NO GROWTH 3 DAYS Performed at [facility], [address], [place], [place] [address]    Report Status PENDING  Incomplete  Culture, blood (Routine x 2)     Status: None  (Preliminary result)   Collection Time: 08/28/24  1:39 PM   Specimen: BLOOD  Result Value Ref Range Status   Specimen Description BLOOD BLOOD RIGHT ARM  Final   Special Requests   Final    BOTTLES DRAWN AEROBIC AND ANAEROBIC Blood Culture adequate volume   Culture   Final    NO GROWTH 3 DAYS Performed at [facility], [address], [place], [place] [address]    Report Status PENDING  Incomplete  MRSA Next Gen by PCR, Nasal     Status: None   Collection Time: 08/28/24 10:28 PM  Result Value Ref Range Status   MRSA by PCR Next Gen NOT DETECTED NOT DETECTED Final    Comment: (NOTE) The GeneXpert MRSA Assay (FDA approved for NASAL specimens only), is one component of a comprehensive MRSA colonization surveillance program. It is not intended to diagnose MRSA infection nor to guide or monitor treatment for MRSA infections. Test performance is  not FDA approved in patients less than 79 years old. Performed at [facility], [address], [place], [place] [address]      Radiology Studies: No results found.  Scheduled Meds:  atorvastatin   20 mg Oral q1800   buPROPion   300 mg Oral Daily   Chlorhexidine  Gluconate Cloth  6 each Topical Q0600   famotidine   20 mg Oral QHS   fenofibrate   160 mg Oral Daily   furosemide   40 mg Intravenous Q12H   heparin   5,000 Units Subcutaneous Q8H   midodrine   5 mg Oral TID WC   NIFEdipine   30 mg Oral Daily   oseltamivir   75 mg Oral BID   pantoprazole   40 mg Oral Daily   pramipexole   0.75 mg Oral TID   tamsulosin   0.4 mg Oral Daily   ticagrelor   90 mg Oral BID   Continuous Infusions:  azithromycin  500 mg (08/30/24 2034)   cefTRIAXone  (ROCEPHIN )  IV 2 g (08/31/24 1237)     LOS: 3 days   Time spent: 55 mins  Alyissa Whidbee Vicci, MD How to contact the [facility] Attending or Consulting provider 7A - 7P or covering provider during after hours 7P -7A, for this patient?  Check the care team in [facility] and look for a) attending/consulting [facility] provider listed and  b) the [facility] team listed Log into [email or web address] to find provider on call.  Locate the [facility] provider you are looking for under [facility] and page to a number that you can be directly reached. If you still have difficulty reaching the provider, please page the [facility] (Director on Call) for the Hospitalists listed on amion for assistance.  08/31/2024, 5:45 PM    "

## 2024-09-01 ENCOUNTER — Inpatient Hospital Stay

## 2024-09-01 ENCOUNTER — Inpatient Hospital Stay: Admitting: Oncology

## 2024-09-01 ENCOUNTER — Inpatient Hospital Stay (HOSPITAL_COMMUNITY)

## 2024-09-01 DIAGNOSIS — R6520 Severe sepsis without septic shock: Secondary | ICD-10-CM | POA: Diagnosis not present

## 2024-09-01 DIAGNOSIS — A419 Sepsis, unspecified organism: Secondary | ICD-10-CM | POA: Diagnosis not present

## 2024-09-01 DIAGNOSIS — J09X1 Influenza due to identified novel influenza A virus with pneumonia: Secondary | ICD-10-CM | POA: Diagnosis not present

## 2024-09-01 DIAGNOSIS — I10 Essential (primary) hypertension: Secondary | ICD-10-CM | POA: Diagnosis not present

## 2024-09-01 DIAGNOSIS — C221 Intrahepatic bile duct carcinoma: Secondary | ICD-10-CM | POA: Diagnosis not present

## 2024-09-01 LAB — GLUCOSE, CAPILLARY
Glucose-Capillary: 117 mg/dL — ABNORMAL HIGH (ref 70–99)
Glucose-Capillary: 114 mg/dL — ABNORMAL HIGH (ref 70–99)
Glucose-Capillary: 92 mg/dL (ref 70–99)
Glucose-Capillary: 104 mg/dL — ABNORMAL HIGH (ref 70–99)

## 2024-09-01 MED ORDER — DOCUSATE SODIUM 100 MG PO CAPS
100.0000 mg | ORAL_CAPSULE | Freq: Two times a day (BID) | ORAL | Status: DC
  Administered 2024-09-01 – 2024-09-02 (×2): 100 mg via ORAL
  Filled 2024-09-01 (×2): qty 1

## 2024-09-01 MED ORDER — TRAMADOL HCL 50 MG PO TABS
50.0000 mg | ORAL_TABLET | Freq: Four times a day (QID) | ORAL | Status: DC | PRN
  Administered 2024-09-01 – 2024-09-03 (×7): 50 mg via ORAL
  Filled 2024-09-01 (×7): qty 1

## 2024-09-01 MED ORDER — DICYCLOMINE HCL 10 MG PO CAPS
10.0000 mg | ORAL_CAPSULE | Freq: Three times a day (TID) | ORAL | Status: DC | PRN
  Administered 2024-09-01 – 2024-09-04 (×9): 10 mg via ORAL
  Filled 2024-09-01 (×10): qty 1

## 2024-09-01 MED ORDER — IPRATROPIUM-ALBUTEROL 0.5-2.5 (3) MG/3ML IN SOLN
3.0000 mL | Freq: Three times a day (TID) | RESPIRATORY_TRACT | Status: DC
  Administered 2024-09-01 – 2024-09-03 (×5): 3 mL via RESPIRATORY_TRACT
  Filled 2024-09-01 (×5): qty 3

## 2024-09-01 MED ORDER — ZOLPIDEM TARTRATE 5 MG PO TABS
5.0000 mg | ORAL_TABLET | Freq: Every day | ORAL | Status: DC
  Administered 2024-09-01 – 2024-09-05 (×5): 5 mg via ORAL
  Filled 2024-09-01 (×5): qty 1

## 2024-09-01 MED ORDER — IPRATROPIUM-ALBUTEROL 0.5-2.5 (3) MG/3ML IN SOLN: 3.0000 mL | Freq: Three times a day (TID) | RESPIRATORY_TRACT | Status: DC

## 2024-09-01 MED ORDER — DEXTROMETHORPHAN POLISTIREX ER 30 MG/5ML PO SUER
30.0000 mg | Freq: Two times a day (BID) | ORAL | Status: DC
  Administered 2024-09-01 – 2024-09-06 (×10): 30 mg via ORAL
  Filled 2024-09-01 (×8): qty 5

## 2024-09-01 NOTE — Progress Notes (Signed)
 Ticagrelor  (Brilinta ) Tablet 90mg  ordered for 2200, pt stated his cardiologist told him to only take once daily last week although order is for twice daily. DO Adefeso made aware, stated we could skip the evening dose tonight and make incoming nurse aware so Pharmacy can correct. Pt made aware.

## 2024-09-01 NOTE — Progress Notes (Signed)
 Nurse at bedside,patient c/o having stomach issues and wanting to speak with the MD regarding testing.. Dr Afton Louder notified.Plan of care on going.

## 2024-09-01 NOTE — TOC Progression Note (Signed)
 Transition of Care [facility]) - Progression Note    Patient Details  Name: Marvin Carr MRN: [id number] Date of Birth: 20-May-1956  [facility] CM/SW Contact  Lucie Lunger, [place] Phone Number: 09/01/2024, 10:48 AM  Clinical Narrative:    CSW spoke with pt at bedside to review with [facility]. Pt states that in that case he prefers to return home with [facility] services. Pt states he does not have an agency preference, [facility] referral sent to local agencies via HUB. Pt states he has a cane, walker and rollator to use in the home. TOC to follow.   Expected Discharge Plan: Skilled Nursing Facility Barriers to Discharge: Continued Medical Work up               Expected Discharge Plan and Services     Post Acute Care Choice: Durable Medical Equipment Living arrangements for the past 2 months: Single Family Home                                       Social Drivers of Health (SDOH) Interventions SDOH Screenings   Food Insecurity: No Food Insecurity (08/28/2024)  Housing: Low Risk (08/28/2024)  Transportation Needs: No Transportation Needs (08/28/2024)  Utilities: Not At Risk (08/28/2024)  Depression (PHQ2-9): Low Risk (08/25/2024)  Recent Concern: Depression (PHQ2-9) - Medium Risk (08/13/2024)  Social Connections: Moderately Isolated (08/28/2024)  Tobacco Use: Medium Risk (08/28/2024)    Readmission Risk Interventions    08/30/2024    4:08 PM 08/29/2024   12:26 PM 07/03/2024   10:21 AM  Readmission Risk Prevention Plan  Transportation Screening Complete Complete Complete  HRI or Home Care Consult   Complete  Social Work Consult for Recovery Care Planning/Counseling   Complete  Palliative Care Screening   Not Applicable  Medication Review Oceanographer) Complete Complete Complete  HRI or Home Care Consult Complete Complete   SW Recovery Care/Counseling Consult Complete Complete   Palliative Care Screening Not  Applicable Not Applicable   Skilled Nursing Facility Complete Not Complete   SNF Comments Agreeable to SNF Pending PT consult

## 2024-09-01 NOTE — Progress Notes (Signed)
 " PROGRESS NOTE   Marvin Carr  FMW:[id number] DOB: 24-May-1956 DOA: 08/28/2024 PCP: [name], MD   Chief Complaint  Patient presents with   Shortness of Breath   Level of care: Telemetry  Brief Admission History:  68 y.o. 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 -He presented to ED secondary to dyspnea, lysed weakness, fatigue, he denies chest pain, reports orthopnea, cough, nonproductive, fever and chills at home, he has diarrhea which is chronic, forts weakness during ambulation yesterday where he felt his legs gave up or he had a fall, had oncology follow-up appointment today where he was noted to be febrile 100.3, tachypneic and tachycardic so he was brought to ED for further evaluation. -In ED workup significant for Lones AF pneumonia, CTA chest negative for PE but significant for multifocal pneumonia, labs were significant for Creatinine, elevated lactic acid at 4.1, improved with IV fluids, his respiratory panel positive for influenza A, negative UA, [facility]ist consulted to admit.   Assessment and Plan:  severe sepsis -- RESOLVED  - Fever, tachycardia, dyspnea, influenza A, hypotension given concern for severe sepsis.  Blood pressure responding well to IV fluid bolus.  No need for vasopressor medications at this time.  Blood cultures NGTD, empiric antibiotics on board.    Acute influenza A -- IMPROVING  - Rapid screen positive for influenza  A.  Given recent hospitalization, coverage for HCAP empirically.  Currently on Rocephin , azithromycin , vancomycin .  MRSA PCR negative and minimal leukocytosis, discontinued vancomycin .  Strep pneumo negative.  Tamiflu  on board.  Monitor respiratory function closely.   Acute hypoxic respiratory failure -- RESOLVED  - Secondary to influenza A.  Weaned to room air.     Metastatic cholangiocarcinoma - Noted peritoneal carcinomatosis and metastatic disease to the liver and bile duct.  Follows with oncology in the outpatient setting.  Not receiving any recent chemotherapy due to multiple hospitalizations.   CAD/HLD - S/p LCx stent 10/25.  Continue Brilinta .  Aspirin  discontinued during recent hospitalization per cardiology recommendations.  Tolerated fenofibrate  as before.   Chronic HFpEF/HTN -Appear to be exacerbated.    Diabetes mellitus, type 2  - Insulin  sliding scale ordered.  CBG (last 3)  Recent Labs    08/31/24 2026 09/01/24 0723 09/01/24 1123  GLUCAP 106* 117* 114*   BPH - Flomax  support.   Anxiety/depression - Resume Wellbutrin  300 mg daily.  DVT prophylaxis: sq heparin  Code Status:  Family Communication:  Disposition: anticipate home with HH    Consultants:   Procedures:   Antimicrobials:    Subjective: Pt reports ongoing cough and SOB, poor exercise tolerance and deconditioning.    Objective: Vitals:   08/31/24 2020 09/01/24 0412 09/01/24 0934 09/01/24 1359  BP: (!) 104/53 117/71 120/82 119/81  Pulse: 90 94 91 95  Resp: 18 19  18   Temp: 98.2 F (36.8 C) (!) 97.4 F (36.3 C) (!) 97.2 F (36.2 C) (!) 97.4 F (36.3 C)  TempSrc: Oral Oral Oral Oral  SpO2: 93% 96% 90% 95%  Weight:  Height:        Intake/Output Summary (Last 24 hours) at 09/01/2024 1615 Last data filed at 09/01/2024 0500 Gross per 24 hour  Intake 718.25 ml  Output 1900 ml  Net -1181.75 ml   Filed Weights   08/28/24 1247 08/28/24 2248 08/31/24 1100  Weight: 115.7 kg 119.1 kg  116.2 kg   Examination:  General exam: Appears calm and comfortable  Respiratory system: shallow BS but clear, no increased work of breathing, on RA oxygen.  Cardiovascular system: normal S1 & S2 heard. No JVD, murmurs, rubs, gallops or clicks. No pedal edema. Gastrointestinal system: Abdomen is nondistended, soft and nontender. No organomegaly or masses felt. Normal bowel sounds heard. Central nervous system: Alert and oriented. No focal neurological deficits. Extremities: Symmetric 5 x 5 power. Skin: No rashes, lesions or ulcers. Psychiatry: Judgement and insight appear normal. Mood & affect appropriate.   Data Reviewed: I have personally reviewed following labs and imaging studies  CBC: Recent Labs  Lab 08/28/24 1211 08/28/24 1334 08/29/24 0459 08/30/24 0250 08/31/24 0423  WBC 10.2 9.8 5.4 4.2 5.5  NEUTROABS 8.6* 8.5*  --   --   --   HGB 9.4* 10.8* 8.6* 8.2* 9.5*  HCT 30.1* 34.4* 27.3* 26.6* 30.5*  MCV 99.7 100.0 98.9 99.6 99.0  PLT 235 272 213 196 212    Basic Metabolic Panel: Recent Labs  Lab 08/28/24 1211 08/28/24 1334 08/29/24 0459 08/30/24 0250 08/31/24 0423  NA 135 135 135 135 138  K 3.8 3.7 3.5 3.2* 3.9  CL 101 99 101 103 102  CO2 16* 18* 25 25 27   GLUCOSE 127* 92 89 103* 87  BUN 14 14 11 10 9   CREATININE 1.59* 1.53* 1.26* 1.07 1.12  CALCIUM  8.3* 8.8* 7.8* 7.6* 8.0*    CBG: Recent Labs  Lab 08/31/24 1137 08/31/24 1615 08/31/24 2026 09/01/24 0723 09/01/24 1123  GLUCAP 108* 122* 106* 117* 114*    Recent Results (from the past 240 hours)  Resp panel by RT-PCR (RSV, Flu A&B, Covid) Anterior Nasal Swab     Status: Abnormal   Collection Time: 08/28/24  1:21 PM   Specimen: Anterior Nasal Swab  Result Value Ref Range Status   SARS Coronavirus 2 by RT PCR NEGATIVE NEGATIVE Final    Comment: (NOTE) SARS-CoV-2 target nucleic acids are NOT DETECTED.  The SARS-CoV-2 RNA is generally detectable in upper respiratory specimens during the acute phase of  infection. The lowest concentration of SARS-CoV-2 viral copies this assay can detect is 138 copies/mL. A negative result does not preclude SARS-Cov-2 infection and should not be used as the sole basis for treatment or other patient management decisions. A negative result may occur with  improper specimen collection/handling, submission of specimen other than nasopharyngeal swab, presence of viral mutation(s) within the areas targeted by this assay, and inadequate number of viral copies(<138 copies/mL). A negative result must be combined with clinical observations, patient history, and epidemiological information. The expected result is Negative.  Fact Sheet for Patients:  bloggercourse.com  Fact Sheet for Healthcare Providers:  seriousbroker.it  This test is no t yet approved or cleared by the [place]  FDA and  has been authorized for detection and/or diagnosis of SARS-CoV-2 by FDA under an Emergency Use Authorization (EUA). This EUA will remain  in effect (meaning this test can be used) for the duration of the COVID-19 declaration under Section 564(b)(1) of the Act, 21 U.S.C.section 360bbb-3(b)(1), unless the authorization is terminated  or revoked sooner.  Influenza A by PCR POSITIVE (A) NEGATIVE Final   Influenza B by PCR NEGATIVE NEGATIVE Final    Comment: (NOTE) The Xpert Xpress SARS-CoV-2/FLU/RSV plus assay is intended as an aid in the diagnosis of influenza from Nasopharyngeal swab specimens and should not be used as a sole basis for treatment. Nasal washings and aspirates are unacceptable for Xpert Xpress SARS-CoV-2/FLU/RSV testing.  Fact Sheet for Patients: bloggercourse.com  Fact Sheet for Healthcare Providers: seriousbroker.it  This test is not yet approved or cleared by the [place]  FDA and has been authorized for detection and/or diagnosis of  SARS-CoV-2 by FDA under an Emergency Use Authorization (EUA). This EUA will remain in effect (meaning this test can be used) for the duration of the COVID-19 declaration under Section 564(b)(1) of the Act, 21 U.S.C. section 360bbb-3(b)(1), unless the authorization is terminated or revoked.     Resp Syncytial Virus by PCR NEGATIVE NEGATIVE Final    Comment: (NOTE) Fact Sheet for Patients: bloggercourse.com  Fact Sheet for Healthcare Providers: seriousbroker.it  This test is not yet approved or cleared by the [place]  FDA and has been authorized for detection and/or diagnosis of SARS-CoV-2 by FDA under an Emergency Use Authorization (EUA). This EUA will remain in effect (meaning this test can be used) for the duration of the COVID-19 declaration under Section 564(b)(1) of the Act, 21 U.S.C. section 360bbb-3(b)(1), unless the authorization is terminated or revoked.  Performed at [facility], [address], [place], [place] [address]   C Difficile Quick Screen w PCR reflex     Status: None   Collection Time: 08/28/24  1:22 PM   Specimen: STOOL  Result Value Ref Range Status   C Diff antigen NEGATIVE NEGATIVE Final   C Diff toxin NEGATIVE NEGATIVE Final   C Diff interpretation No C. difficile detected.  Final    Comment: Performed at [facility], [address], [place], [place] [address]  Culture, blood (Routine x 2)     Status: None (Preliminary result)   Collection Time: 08/28/24  1:34 PM   Specimen: BLOOD  Result Value Ref Range Status   Specimen Description BLOOD BLOOD RIGHT ARM  Final   Special Requests   Final    BOTTLES DRAWN AEROBIC AND ANAEROBIC Blood Culture adequate volume   Culture   Final    NO GROWTH 4 DAYS Performed at [facility], [address], [place], [place] [address]    Report Status PENDING  Incomplete  Culture, blood (Routine x 2)     Status: None (Preliminary result)   Collection Time: 08/28/24   1:39 PM   Specimen: BLOOD  Result Value Ref Range Status   Specimen Description BLOOD BLOOD RIGHT ARM  Final   Special Requests   Final    BOTTLES DRAWN AEROBIC AND ANAEROBIC Blood Culture adequate volume   Culture   Final    NO GROWTH 4 DAYS Performed at [facility], [address] [place], [place] [address]    Report Status PENDING  Incomplete  MRSA Next Gen by PCR, Nasal     Status: None   Collection Time: 08/28/24 10:28 PM  Result Value Ref Range Status   MRSA by PCR Next Gen NOT DETECTED NOT DETECTED Final    Comment: (NOTE) The GeneXpert MRSA Assay (FDA approved for NASAL specimens only), is one component of a comprehensive MRSA colonization surveillance program. It is not intended to diagnose MRSA infection nor to guide or monitor treatment for MRSA infections. Test performance is  not FDA approved in patients less than 67 years old. Performed at [facility], [address], [place], [place] [address]      Radiology Studies: No results found.  Scheduled Meds:  atorvastatin   20 mg Oral q1800   buPROPion   300 mg Oral Daily   famotidine   20 mg Oral QHS   fenofibrate   160 mg Oral Daily   heparin   5,000 Units Subcutaneous Q8H   midodrine   5 mg Oral TID WC   NIFEdipine   30 mg Oral Daily   oseltamivir   75 mg Oral BID   pantoprazole   40 mg Oral Daily   pramipexole   0.75 mg Oral TID   tamsulosin   0.4 mg Oral Daily   ticagrelor   90 mg Oral BID   Continuous Infusions:  azithromycin  Stopped (08/31/24 2107)    LOS: 4 days   Time spent: 55 mins  Ani Deoliveira Vicci, MD How to contact the [facility] Attending or Consulting provider 7A - 7P or covering provider during after hours 7P -7A, for this patient?  Check the care team in [facility] and look for a) attending/consulting [facility] provider listed and b) the [facility] team listed Log into [email or web address] to find provider on call.  Locate the [facility] provider you are looking for under [facility] and page to a number that you can be directly  reached. If you still have difficulty reaching the provider, please page the [facility] (Director on Call) for the Hospitalists listed on amion for assistance.  09/01/2024, 4:15 PM    "

## 2024-09-01 NOTE — Plan of Care (Signed)
   Problem: Education: Goal: Knowledge of General Education information will improve Description Including pain rating scale, medication(s)/side effects and non-pharmacologic comfort measures Outcome: Progressing   Problem: Education: Goal: Knowledge of General Education information will improve Description Including pain rating scale, medication(s)/side effects and non-pharmacologic comfort measures Outcome: Progressing

## 2024-09-02 ENCOUNTER — Inpatient Hospital Stay (HOSPITAL_COMMUNITY)

## 2024-09-02 ENCOUNTER — Encounter: Payer: Self-pay | Admitting: Oncology

## 2024-09-02 ENCOUNTER — Encounter (HOSPITAL_COMMUNITY): Payer: Self-pay

## 2024-09-02 ENCOUNTER — Other Ambulatory Visit (HOSPITAL_COMMUNITY)

## 2024-09-02 DIAGNOSIS — I10 Essential (primary) hypertension: Secondary | ICD-10-CM | POA: Diagnosis not present

## 2024-09-02 DIAGNOSIS — C221 Intrahepatic bile duct carcinoma: Secondary | ICD-10-CM | POA: Diagnosis not present

## 2024-09-02 DIAGNOSIS — J09X1 Influenza due to identified novel influenza A virus with pneumonia: Secondary | ICD-10-CM | POA: Diagnosis not present

## 2024-09-02 DIAGNOSIS — A419 Sepsis, unspecified organism: Secondary | ICD-10-CM | POA: Diagnosis not present

## 2024-09-02 DIAGNOSIS — R6520 Severe sepsis without septic shock: Secondary | ICD-10-CM | POA: Diagnosis not present

## 2024-09-02 LAB — BASIC METABOLIC PANEL WITH GFR
Anion gap: 16 — ABNORMAL HIGH (ref 5–15)
BUN: 9 mg/dL (ref 8–23)
CO2: 23 mmol/L (ref 22–32)
Calcium: 8.7 mg/dL — ABNORMAL LOW (ref 8.9–10.3)
Chloride: 98 mmol/L (ref 98–111)
Creatinine, Ser: 1.01 mg/dL (ref 0.61–1.24)
GFR, Estimated: >60 mL/min
Glucose, Bld: 99 mg/dL (ref 70–99)
Potassium: 3.4 mmol/L — ABNORMAL LOW (ref 3.5–5.1)
Sodium: 137 mmol/L (ref 135–145)

## 2024-09-02 LAB — CBC
HCT: 34.5 % — ABNORMAL LOW (ref 39.0–52.0)
Hemoglobin: 10.9 g/dL — ABNORMAL LOW (ref 13.0–17.0)
MCH: 30.4 pg (ref 26.0–34.0)
MCHC: 31.6 g/dL (ref 30.0–36.0)
MCV: 96.4 fL (ref 80.0–100.0)
Platelets: 245 K/uL (ref 150–400)
RBC: 3.58 MIL/uL — ABNORMAL LOW (ref 4.22–5.81)
RDW: 19.9 % — ABNORMAL HIGH (ref 11.5–15.5)
WBC: 6.3 K/uL (ref 4.0–10.5)
nRBC: 0.3 % — ABNORMAL HIGH (ref 0.0–0.2)

## 2024-09-02 LAB — GLUCOSE, CAPILLARY
Glucose-Capillary: 93 mg/dL (ref 70–99)
Glucose-Capillary: 131 mg/dL — ABNORMAL HIGH (ref 70–99)
Glucose-Capillary: 118 mg/dL — ABNORMAL HIGH (ref 70–99)
Glucose-Capillary: 103 mg/dL — ABNORMAL HIGH (ref 70–99)

## 2024-09-02 LAB — CULTURE, BLOOD (ROUTINE X 2)
Culture: NO GROWTH
Special Requests: ADEQUATE

## 2024-09-02 LAB — PROCALCITONIN: Procalcitonin: 0.58 ng/mL

## 2024-09-02 MED ORDER — POTASSIUM CHLORIDE CRYS ER 20 MEQ PO TBCR
40.0000 meq | EXTENDED_RELEASE_TABLET | Freq: Once | ORAL | Status: AC
  Administered 2024-09-02: 40 meq via ORAL
  Filled 2024-09-02: qty 2

## 2024-09-02 MED ORDER — PROCHLORPERAZINE EDISYLATE 10 MG/2ML IJ SOLN
10.0000 mg | Freq: Four times a day (QID) | INTRAMUSCULAR | Status: DC | PRN
  Administered 2024-09-02 – 2024-09-06 (×9): 10 mg via INTRAVENOUS
  Filled 2024-09-02 (×9): qty 2

## 2024-09-02 MED ORDER — FUROSEMIDE 10 MG/ML IJ SOLN
40.0000 mg | Freq: Every day | INTRAMUSCULAR | Status: DC
  Administered 2024-09-02: 40 mg via INTRAVENOUS
  Filled 2024-09-02: qty 4

## 2024-09-02 MED ORDER — DOCUSATE SODIUM 100 MG PO CAPS
200.0000 mg | ORAL_CAPSULE | Freq: Two times a day (BID) | ORAL | Status: DC
  Administered 2024-09-02 – 2024-09-06 (×7): 200 mg via ORAL
  Filled 2024-09-02 (×8): qty 2

## 2024-09-02 NOTE — Plan of Care (Signed)
   Problem: Education: Goal: Knowledge of General Education information will improve Description Including pain rating scale, medication(s)/side effects and non-pharmacologic comfort measures Outcome: Progressing   Problem: Education: Goal: Knowledge of General Education information will improve Description Including pain rating scale, medication(s)/side effects and non-pharmacologic comfort measures Outcome: Progressing

## 2024-09-02 NOTE — Progress Notes (Signed)
 Opened in error

## 2024-09-02 NOTE — Progress Notes (Signed)
 Lab called with result of gram positive rods in anaerob bottle 1 of 4, DO Adefeso made aware.

## 2024-09-02 NOTE — Plan of Care (Signed)
   Problem: Education: Goal: Knowledge of General Education information will improve Description: Including pain rating scale, medication(s)/side effects and non-pharmacologic comfort measures Outcome: Progressing   Problem: Activity: Goal: Risk for activity intolerance will decrease Outcome: Progressing   Problem: Coping: Goal: Level of anxiety will decrease Outcome: Progressing

## 2024-09-02 NOTE — Telephone Encounter (Signed)
 Is there anything you can to do get them to do this IP?

## 2024-09-02 NOTE — Care Management Important Message (Signed)
 Important Message  Patient Details  Name: Jamaurion Slemmer MRN: [id number] Date of Birth: [birth year]   Important Message Given:  Yes - Medicare IM     Ezriel Boffa L Carah Barrientes 09/02/2024, 2:40 PM

## 2024-09-02 NOTE — Progress Notes (Signed)
 " PROGRESS NOTE   Marvin Carr  FMW:[id number] DOB: Aug 03, 1956 DOA: 08/28/2024 PCP: [name], MD   Chief Complaint  Patient presents with   Shortness of Breath   Level of care: Telemetry  Brief Admission History:  68 y.o. 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 -He presented to ED secondary to dyspnea, lysed weakness, fatigue, he denies chest pain, reports orthopnea, cough, nonproductive, fever and chills at home, he has diarrhea which is chronic, forts weakness during ambulation yesterday where he felt his legs gave up or he had a fall, had oncology follow-up appointment today where he was noted to be febrile 100.3, tachypneic and tachycardic so he was brought to ED for further evaluation. -In ED workup significant for Lones AF pneumonia, CTA chest negative for PE but significant for multifocal pneumonia, labs were significant for Creatinine, elevated lactic acid at 4.1, improved with IV fluids, his respiratory panel positive for influenza A, negative UA, [facility] hospitalist consulted to admit.   Assessment and Plan:  severe sepsis -- RESOLVED  - Fever, tachycardia, dyspnea, influenza A, hypotension given concern for severe sepsis.  Blood pressure responding well to IV fluid bolus.  No need for vasopressor medications at this time.  Blood cultures NGTD, empiric antibiotics on board.    Acute influenza A -- IMPROVING  - Rapid screen positive for influenza  A.  Given recent hospitalization, coverage for HCAP empirically.  Currently on Rocephin , azithromycin , vancomycin .  MRSA PCR negative and minimal leukocytosis, discontinued vancomycin .  Strep pneumo negative.  He is completing a 5 day course of tamiflu .   Monitoring respiratory function closely.   Acute hypoxic respiratory failure -- RESOLVED  - Secondary to influenza A.  Weaned to room air.   -- he continues to complain of severe cough and shortness of breath on room air. He says he was experiencing this at least 1 month prior to influenza infection.   We re-evaluated everything and taking another look.  He had a CT angio chest that was neg for PE.  CXR 12/22 suggesting mild pulmonary edema, re-ordering IV lasix , added bronchodilators and added scheduled cough syrup and incentive spirometry, encouraged ambulation, he completed a course of ceftriaxone  and azithromycin .     Positive blood culture -- 1/4 blood culture reported positive for Gram pos rods in anaerobic bottle -- waiting on ID -- he was treated with IV ceftriaxone  2 gm daily x 5 doses   Metastatic cholangiocarcinoma - Noted peritoneal carcinomatosis and metastatic disease to the liver and bile duct.  Follows with oncology in the outpatient setting.  Not receiving any recent chemotherapy due to multiple hospitalizations.   CAD/HLD - S/p LCx stent 10/25.  Continue Brilinta .  Aspirin  discontinued during recent hospitalization per cardiology recommendations.  Tolerated fenofibrate  as before.  Constipation -- he is still having flatus  -- we worry about recurrent SBO which he was just discharged from [facility] for  -- ordered a KUB 12/23 -- stool softeners-- he is on colace 100 mg BID, increased dose to 200 mg BID  Acute on chronic HFpEF/HTN -Appear to be exacerbated and responding to IV furosemide  40 mg daily  -- added daily weights, strict I/Os, fluid restriction -- potassium supplement ordered -- BMP in Am  Filed Weights   08/28/24  1247 08/28/24 2248 08/31/24 1100  Weight: 115.7 kg 119.1 kg 116.2 kg    Intake/Output Summary (Last 24 hours) at 09/02/2024 1103 Last data filed at 09/02/2024 0500 Gross per 24 hour  Intake 480 ml  Output 900 ml  Net -420 ml    Diabetes mellitus, type 2  - Insulin  sliding scale ordered.  CBG (last 3)  Recent Labs    09/01/24 1619 09/01/24 2047 09/02/24 0732  GLUCAP 92 104* 93   BPH - Flomax  support.   Anxiety/depression - Resumed Wellbutrin  300 mg daily.  DVT prophylaxis: sq heparin  Code Status:  Family Communication:  Disposition: anticipate home with HH    Consultants:   Procedures:   Antimicrobials:    Subjective: Pt says he has not had a bowel movement but having flatus, worried about recurrent bowel obstruction, shortness of breath a little better with neb treatments.      Objective: Vitals:   09/01/24 2015 09/02/24 0357 09/02/24 0819 09/02/24 0907  BP: 128/88 128/83  125/84  Pulse: 87 92  99  Resp: 20 18    Temp: 98.1 F (36.7 C) 98 F (36.7 C)  98.2 F (36.8 C)  TempSrc: Oral Oral  Oral  SpO2: 94% 93% 95% 97%  Weight:      Height:        Intake/Output Summary (Last 24 hours) at 09/02/2024 1057 Last data filed at 09/02/2024 0500 Gross per 24 hour  Intake 480 ml  Output 900 ml  Net -420 ml   Filed Weights   08/28/24 1247 08/28/24 2248 08/31/24 1100  Weight: 115.7 kg 119.1 kg 116.2 kg   Examination:  General exam: Appears calm and comfortable  Respiratory system: shallow BS but clear, no increased work of breathing, on RA oxygen.  Cardiovascular system: normal S1 & S2 heard. No JVD, murmurs, rubs, gallops or clicks. No pedal edema. Gastrointestinal system: Abdomen is mildly distended, soft and nontender. No organomegaly or masses felt. Normal bowel sounds heard. Central nervous system: Alert and oriented. No focal neurological deficits. Extremities: Symmetric 5 x 5 power. Skin: No rashes, lesions or ulcers. Psychiatry: Judgement and  insight appear normal. Mood & affect appropriate.   Data Reviewed: I have personally reviewed following labs and imaging studies  CBC: Recent Labs  Lab 08/28/24 1211 08/28/24 1334 08/29/24 0459 08/30/24 0250 08/31/24 0423 09/02/24 0412  WBC 10.2 9.8 5.4 4.2 5.5 6.3  NEUTROABS 8.6* 8.5*  --   --   --   --   HGB 9.4* 10.8* 8.6* 8.2* 9.5* 10.9*  HCT 30.1* 34.4* 27.3* 26.6* 30.5* 34.5*  MCV 99.7 100.0 98.9 99.6 99.0 96.4  PLT 235 272 213 196 212 245    Basic Metabolic Panel: Recent Labs  Lab 08/28/24 1334 08/29/24 0459 08/30/24 0250 08/31/24 0423 09/02/24 0412  NA 135 135 135 138 137  K 3.7 3.5 3.2* 3.9 3.4*  CL 99 101 103 102 98  CO2 18* 25 25 27 23   GLUCOSE 92 89 103* 87 99  BUN 14 11 10 9 9   CREATININE 1.53* 1.26* 1.07 1.12 1.01  CALCIUM  8.8* 7.8* 7.6* 8.0* 8.7*    CBG: Recent Labs  Lab 09/01/24 0723 09/01/24 1123 09/01/24 1619 09/01/24 2047 09/02/24 0732  GLUCAP 117* 114* 92 104* 93  Recent Results (from the past 240 hours)  Resp panel by RT-PCR (RSV, Flu A&B, Covid) Anterior Nasal Swab     Status: Abnormal   Collection Time: 08/28/24  1:21 PM   Specimen: Anterior Nasal Swab  Result Value Ref Range Status   SARS Coronavirus 2 by RT PCR NEGATIVE NEGATIVE Final    Comment: (NOTE) SARS-CoV-2 target nucleic acids are NOT DETECTED.  The SARS-CoV-2 RNA is generally detectable in upper respiratory specimens during the acute phase of infection. The lowest concentration of SARS-CoV-2 viral copies this assay can detect is 138 copies/mL. A negative result does not preclude SARS-Cov-2 infection and should not be used as the sole basis for treatment or other patient management decisions. A negative result may occur with  improper specimen collection/handling, submission of specimen other than nasopharyngeal swab, presence of viral mutation(s) within the areas targeted by this assay, and inadequate number of viral copies(<138 copies/mL). A negative result must  be combined with clinical observations, patient history, and epidemiological information. The expected result is Negative.  Fact Sheet for Patients:  bloggercourse.com  Fact Sheet for Healthcare Providers:  seriousbroker.it  This test is no t yet approved or cleared by the [place]  FDA and  has been authorized for detection and/or diagnosis of SARS-CoV-2 by FDA under an Emergency Use Authorization (EUA). This EUA will remain  in effect (meaning this test can be used) for the duration of the COVID-19 declaration under Section 564(b)(1) of the Act, 21 U.S.C.section 360bbb-3(b)(1), unless the authorization is terminated  or revoked sooner.       Influenza A by PCR POSITIVE (A) NEGATIVE Final   Influenza B by PCR NEGATIVE NEGATIVE Final    Comment: (NOTE) The Xpert Xpress SARS-CoV-2/FLU/RSV plus assay is intended as an aid in the diagnosis of influenza from Nasopharyngeal swab specimens and should not be used as a sole basis for treatment. Nasal washings and aspirates are unacceptable for Xpert Xpress SARS-CoV-2/FLU/RSV testing.  Fact Sheet for Patients: bloggercourse.com  Fact Sheet for Healthcare Providers: seriousbroker.it  This test is not yet approved or cleared by the [place]  FDA and has been authorized for detection and/or diagnosis of SARS-CoV-2 by FDA under an Emergency Use Authorization (EUA). This EUA will remain in effect (meaning this test can be used) for the duration of the COVID-19 declaration under Section 564(b)(1) of the Act, 21 U.S.C. section 360bbb-3(b)(1), unless the authorization is terminated or revoked.     Resp Syncytial Virus by PCR NEGATIVE NEGATIVE Final    Comment: (NOTE) Fact Sheet for Patients: bloggercourse.com  Fact Sheet for Healthcare Providers: seriousbroker.it  This test is  not yet approved or cleared by the [place]  FDA and has been authorized for detection and/or diagnosis of SARS-CoV-2 by FDA under an Emergency Use Authorization (EUA). This EUA will remain in effect (meaning this test can be used) for the duration of the COVID-19 declaration under Section 564(b)(1) of the Act, 21 U.S.C. section 360bbb-3(b)(1), unless the authorization is terminated or revoked.  Performed at [facility], [address] [place], [place] [address]   C Difficile Quick Screen w PCR reflex     Status: None   Collection Time: 08/28/24  1:22 PM   Specimen: STOOL  Result Value Ref Range Status   C Diff antigen NEGATIVE NEGATIVE Final   C Diff toxin NEGATIVE NEGATIVE Final   C Diff interpretation No C. difficile detected.  Final    Comment: Performed at [facility], [address], [place], [place]  [address]  Culture, blood (Routine x 2)     Status: None (Preliminary result)   Collection Time: 08/28/24  1:34 PM   Specimen: BLOOD  Result Value Ref Range Status   Specimen Description   Final    BLOOD BLOOD RIGHT ARM Performed at [facility], [address], [place], [place] [address]    Special Requests   Final    BOTTLES DRAWN AEROBIC AND ANAEROBIC Blood Culture adequate volume Performed at [facility], [address], [place], [place] [address]    Culture  Setup Time   Final    GRAM POSITIVE RODS ANAEROBIC BOTTLE ONLY Gram Stain Report Called to,Read Back By and Verified With: B BROOKS,RN@0300  09/02/24 MK GS REVA GPR BY DD 09/02/2024 @ 0502 Performed at [facility], [address], [place], [place] [address]    Culture GRAM POSITIVE RODS  Final   Report Status PENDING  Incomplete  Culture, blood (Routine x 2)     Status: None   Collection Time: 08/28/24  1:39 PM   Specimen: BLOOD  Result Value Ref Range Status   Specimen Description BLOOD BLOOD RIGHT ARM  Final   Special Requests   Final    BOTTLES DRAWN AEROBIC AND ANAEROBIC Blood Culture adequate  volume   Culture   Final    NO GROWTH 5 DAYS Performed at [facility], [address], [place], [place] [address]    Report Status 09/02/2024 FINAL  Final  MRSA Next Gen by PCR, Nasal     Status: None   Collection Time: 08/28/24 10:28 PM  Result Value Ref Range Status   MRSA by PCR Next Gen NOT DETECTED NOT DETECTED Final    Comment: (NOTE) The GeneXpert MRSA Assay (FDA approved for NASAL specimens only), is one component of a comprehensive MRSA colonization surveillance program. It is not intended to diagnose MRSA infection nor to guide or monitor treatment for MRSA infections. Test performance is not FDA approved in patients less than 72 years old. Performed at [facility], [address] [place], [place] [address]      Radiology Studies: DG CHEST PORT 1 VIEW Result Date: 09/01/2024 CLINICAL DATA:  Dyspnea. Influenza A. Shortness of breath. Chest tightness. EXAM: PORTABLE CHEST 1 VIEW COMPARISON:  08/29/2019 radiograph and CT FINDINGS: Right chest port remains in place. Stable heart size and mediastinal contours. Diffuse interstitial coarsening, with slight progression from prior exam. Left pleural effusion and basilar opacity are similar. No pneumothorax. IMPRESSION: 1. Diffuse interstitial coarsening, with slight progression from prior exam, may represent atypical infection or pulmonary edema. 2. Left pleural effusion and basilar opacity are similar. Electronically Signed   By: Andrea Gasman M.D.   On: 09/01/2024 16:47   Scheduled Meds:  atorvastatin   20 mg Oral q1800   buPROPion   300 mg Oral Daily   dextromethorphan   30 mg Oral BID   docusate sodium   100 mg Oral BID   famotidine   20 mg Oral QHS   fenofibrate   160 mg Oral Daily   heparin   5,000 Units Subcutaneous Q8H   ipratropium-albuterol   3 mL Nebulization TID   midodrine   5 mg Oral TID WC   NIFEdipine   30 mg Oral Daily   pantoprazole   40 mg Oral Daily   pramipexole   0.75 mg Oral TID   tamsulosin   0.4 mg Oral Daily    ticagrelor   90 mg Oral BID   zolpidem   5 mg Oral QHS   Continuous Infusions:   LOS: 5  days   Time spent: 55 mins  Marguis Mathieson Vicci, MD How to contact the [facility] Attending or Consulting provider 7A - 7P or covering provider during after hours 7P -7A, for this patient?  Check the care team in [facility] and look for a) attending/consulting [facility] provider listed and b) the [facility] team listed Log into [email or web address] to find provider on call.  Locate the [facility] provider you are looking for under [facility] and page to a number that you can be directly reached. If you still have difficulty reaching the provider, please page the [facility] (Director on Call) for the Hospitalists listed on amion for assistance.  09/02/2024, 10:57 AM    "

## 2024-09-03 ENCOUNTER — Inpatient Hospital Stay (HOSPITAL_COMMUNITY)

## 2024-09-03 ENCOUNTER — Encounter (HOSPITAL_COMMUNITY): Payer: Self-pay | Admitting: Internal Medicine

## 2024-09-03 DIAGNOSIS — A419 Sepsis, unspecified organism: Secondary | ICD-10-CM | POA: Diagnosis not present

## 2024-09-03 DIAGNOSIS — J189 Pneumonia, unspecified organism: Secondary | ICD-10-CM

## 2024-09-03 DIAGNOSIS — Z7189 Other specified counseling: Secondary | ICD-10-CM

## 2024-09-03 DIAGNOSIS — R6520 Severe sepsis without septic shock: Secondary | ICD-10-CM | POA: Diagnosis not present

## 2024-09-03 DIAGNOSIS — C787 Secondary malignant neoplasm of liver and intrahepatic bile duct: Secondary | ICD-10-CM

## 2024-09-03 DIAGNOSIS — J101 Influenza due to other identified influenza virus with other respiratory manifestations: Secondary | ICD-10-CM

## 2024-09-03 DIAGNOSIS — K56609 Unspecified intestinal obstruction, unspecified as to partial versus complete obstruction: Secondary | ICD-10-CM | POA: Diagnosis not present

## 2024-09-03 DIAGNOSIS — Z66 Do not resuscitate: Secondary | ICD-10-CM

## 2024-09-03 DIAGNOSIS — C221 Intrahepatic bile duct carcinoma: Secondary | ICD-10-CM | POA: Diagnosis not present

## 2024-09-03 DIAGNOSIS — J09X1 Influenza due to identified novel influenza A virus with pneumonia: Secondary | ICD-10-CM | POA: Diagnosis not present

## 2024-09-03 DIAGNOSIS — Z789 Other specified health status: Secondary | ICD-10-CM

## 2024-09-03 DIAGNOSIS — Z515 Encounter for palliative care: Secondary | ICD-10-CM

## 2024-09-03 LAB — BODY FLUID CELL COUNT WITH DIFFERENTIAL
Eos, Fluid: 0 %
Lymphs, Fluid: 79 %
Monocyte-Macrophage-Serous Fluid: 20 % — ABNORMAL LOW (ref 50–90)
Neutrophil Count, Fluid: 1 % (ref 0–25)
Total Nucleated Cell Count, Fluid: 45 uL (ref 0–1000)

## 2024-09-03 LAB — BASIC METABOLIC PANEL WITH GFR
Anion gap: 8 (ref 5–15)
BUN: 10 mg/dL (ref 8–23)
CO2: 30 mmol/L (ref 22–32)
Calcium: 8.6 mg/dL — ABNORMAL LOW (ref 8.9–10.3)
Chloride: 96 mmol/L — ABNORMAL LOW (ref 98–111)
Creatinine, Ser: 1.23 mg/dL (ref 0.61–1.24)
GFR, Estimated: >60 mL/min
Glucose, Bld: 100 mg/dL — ABNORMAL HIGH (ref 70–99)
Potassium: 4.1 mmol/L (ref 3.5–5.1)
Sodium: 134 mmol/L — ABNORMAL LOW (ref 135–145)

## 2024-09-03 LAB — GLUCOSE, CAPILLARY
Glucose-Capillary: 111 mg/dL — ABNORMAL HIGH (ref 70–99)
Glucose-Capillary: 114 mg/dL — ABNORMAL HIGH (ref 70–99)
Glucose-Capillary: 113 mg/dL — ABNORMAL HIGH (ref 70–99)
Glucose-Capillary: 110 mg/dL — ABNORMAL HIGH (ref 70–99)

## 2024-09-03 LAB — CBC
HCT: 32.8 % — ABNORMAL LOW (ref 39.0–52.0)
Hemoglobin: 10.3 g/dL — ABNORMAL LOW (ref 13.0–17.0)
MCH: 30.6 pg (ref 26.0–34.0)
MCHC: 31.4 g/dL (ref 30.0–36.0)
MCV: 97.3 fL (ref 80.0–100.0)
Platelets: 236 K/uL (ref 150–400)
RBC: 3.37 MIL/uL — ABNORMAL LOW (ref 4.22–5.81)
RDW: 20.3 % — ABNORMAL HIGH (ref 11.5–15.5)
WBC: 7.7 K/uL (ref 4.0–10.5)
nRBC: 0.3 % — ABNORMAL HIGH (ref 0.0–0.2)

## 2024-09-03 LAB — GRAM STAIN: Gram Stain: NONE SEEN

## 2024-09-03 LAB — PROCALCITONIN: Procalcitonin: 0.68 ng/mL

## 2024-09-03 MED ORDER — POLYETHYLENE GLYCOL 3350 17 G PO PACK
17.0000 g | PACK | Freq: Two times a day (BID) | ORAL | Status: DC
  Administered 2024-09-03 – 2024-09-06 (×5): 17 g via ORAL
  Filled 2024-09-03 (×7): qty 1

## 2024-09-03 MED ORDER — BISACODYL 10 MG RE SUPP
10.0000 mg | Freq: Two times a day (BID) | RECTAL | Status: AC
  Administered 2024-09-03: 10 mg via RECTAL
  Filled 2024-09-03: qty 1

## 2024-09-03 MED ORDER — IOHEXOL 9 MG/ML PO SOLN
ORAL | Status: AC
  Filled 2024-09-03: qty 1000

## 2024-09-03 MED ORDER — HYDROMORPHONE HCL 1 MG/ML IJ SOLN
0.5000 mg | Freq: Once | INTRAMUSCULAR | Status: AC | PRN
  Administered 2024-09-03: 0.5 mg via INTRAVENOUS
  Filled 2024-09-03: qty 0.5

## 2024-09-03 MED ORDER — LIDOCAINE HCL (PF) 2 % IJ SOLN
10.0000 mL | Freq: Once | INTRAMUSCULAR | Status: AC
  Administered 2024-09-03: 10 mL

## 2024-09-03 MED ORDER — IPRATROPIUM-ALBUTEROL 0.5-2.5 (3) MG/3ML IN SOLN
3.0000 mL | Freq: Two times a day (BID) | RESPIRATORY_TRACT | Status: DC
  Administered 2024-09-03 – 2024-09-04 (×3): 3 mL via RESPIRATORY_TRACT
  Filled 2024-09-03 (×3): qty 3

## 2024-09-03 MED ORDER — IOHEXOL 300 MG/ML  SOLN
100.0000 mL | Freq: Once | INTRAMUSCULAR | Status: AC | PRN
  Administered 2024-09-03: 100 mL via INTRAVENOUS

## 2024-09-03 NOTE — Consult Note (Signed)
 "                                                  Palliative Care Consult Note                                  Date: 09/03/2024   Patient Name: Marvin Carr  DOB: 1955/10/29  MRN: [id number]  Age / Sex: 68 y.o., male  PCP: Marvin Norleen PEDLAR, MD Referring Physician: Evonnie Lenis, MD  Reason for Consultation: Establishing goals of care  Past Medical History:  Diagnosis Date   Anxiety    Arthritis    Bladder cancer (HCC) 09/11/2009   CAD (coronary artery disease), native coronary artery    a. Mildly elevated troponin 03/2013, cath with nonobstructive disease including 50% AV groove distal stenosis before large OM   Depression    Essential hypertension    Headache(784.0)    History of migraines   Hyperglycemia    Metastatic cancer to liver (HCC)    Mixed hyperlipidemia    Neuropathy    Obesity    Port-A-Cath in place 09/30/2021   Pre-diabetes    Seasonal allergies    Sleep apnea    On CPAP    Subjective:   This NP Marvin Carr reviewed medical records, received report from team, assessed the patient and then meet at the patient's bedside to discuss diagnosis, prognosis, GOC, EOL wishes disposition and options.  Before meeting with the patient/family, I spent time reviewing the chart notes including previous outpatient oncology notes, previous outpatient cardio oncology notes, admission H&P, multiple hospitalist notes including hospitalist note from yesterday, nursing notes from yesterday, nursing note from today, hospitalist note from today, surgeon note from today. I also reviewed vital signs, nursing flowsheets, medication administrations record, labs, and imaging. Labs reviewed include CBC which shows normal white blood cell, procalcitonin 0.68 count in the setting of admission for sepsis now resolved.  I met with the patient at bedside, no family is present.   We meet to discuss diagnosis prognosis, GOC, EOL wishes, disposition and options. Concept of Palliative Care was introduced  as specialized medical care for people and their families living with serious illness.  If focuses on providing relief from the symptoms and stress of a serious illness.  The goal is to improve quality of life for both the patient and the family. Values and goals of care important to patient and family were attempted to be elicited.  Created space and opportunity for patient  and family to explore thoughts and feelings regarding current medical situation   Natural trajectory and current clinical status were discussed. Questions and concerns addressed. Patient  encouraged to call with questions or concerns.    Patient/Family Understanding of Illness: The patient understands that his small bowel obstruction may be recurring.  He notes that he has cancer but missed his last 3 treatments because he was in the hospital, wanting Dr. [name] to become his primary oncologist.  He seems to have issue with differing stories where Dr. MARLA (previous oncologist at [facility]) noted that this was a cancer that he could live with with treatment and now Dr. Davonna states that he only has 3 months to live.  He did establish a very good rapport with Dr. [name] while  he was at [facility] and would like her to be his primary oncologist.  Life Review: Pulled from previous palliative note, reviewed with patient and confirmed: Marvin Carr shares that he is from [place], [place] .  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  He shares that he was able to work for [employer] during his years away.  He moved back to [name] in 09/08/10 when his mother and father became ill.  Marvin Carr acted as his parents primary caregiver.  He shares that his mother died in 2011-09-09 and father in 09/09/2019.  Marvin Carr expresses that he has an aunt Marvin Carr and cousin Marvin Carr with whom he has a strong relationship.  Marvin Carr is not married and does not have children.  Marvin Carr is a man of faith practicing within Christianity.    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  Baseline Status: From previous palliative note and confirmed with patient: Preceding hospitalization (in early September 08, 2024) Marvin Carr was living by himself in a single-family home.  He shares he could attend to B ADLs and IADLs.  He has had able to eat well preceding symptoms of recent hospitalization.   Added today: Multiple recent hospitalizations, friends are helping to care for his cats.  Today's Discussion: In addition to discussions described above he had extensive discussion on various topics.  We talked about previous conversations surrounding his cancer care and previous palliative conversations.  We talked about the progression of his illness while in the hospital.  He thinks that his bowel obstruction is recurring, did speak with Dr. Mavis with surgery today.  He also spoke with Dr. Vicci yesterday.  He notes that he is not a surgical candidate.  We talked about recommendations from the medical team including paracentesis to see if removing some of the ascites would help with the partial SBO.  He notes that they did take off 1-1/2 L today.  We talked about if this does not help, or if his bowel obstruction progresses then there would be the possible option for an IR placed venting G-tube.  He states that the surgeon talked to him about this this morning.  At this point we recapped the previous conversations with palliative about venting G-tube's and the prognosis in situations where he had malignant obstructions requiring venting G-tube's.  We talked about the function of the venting G-tube and that if we are at that point there may be very limited options for continued care and typically in that situation it is appropriate to start having conversations about possibly involving hospice.  He understands this and stated it was going to get there at some  point.  I shared that if we do determine we are at that point, we will thoroughly talk to the options and encouraged him to make his own decisions for how he would like to proceed.  At this point we decided to give it time to see if the paracentesis will help with resolution of his partial SBO.  If not, we will talk through it including possible option for venting G-tube.  At this point we decide to pray for improvement, allow time to see if this gets better, follow-up in a couple days for ongoing goals of care conversations.  I shared that no matter how his clinical course progresses we will continue to support him and have open and honest conversation  about how to proceed.  I provided emotional and general support through therapeutic listening, empathy, sharing of stories, and other techniques. I answered all questions and addressed all concerns to the best of my ability.  Goals: Remain DNR/DNI (DNR decimated), time for outcomes, follow-up GOC conversations in 2 days to discuss need/possibility of venting G-tube.  He seems to be open to conversations about hospice if clinical situation does not improve and we are out of options.  Review of Systems  Respiratory:  Negative for shortness of breath.   Cardiovascular:  Negative for chest pain.  Gastrointestinal:  Positive for abdominal pain and nausea. Negative for vomiting.    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  Constitutional:      General: He is not in acute distress.    Appearance: He is ill-appearing. He is not toxic-appearing.  HENT:     Head:  Normocephalic and atraumatic.  Cardiovascular:     Rate and Rhythm: Normal rate.  Pulmonary:     Effort: Pulmonary effort is normal. No respiratory distress.  Abdominal:     General: There is distension.     Tenderness: There is abdominal tenderness.  Skin:    General: Skin is warm and dry.  Neurological:     General: No focal deficit present.     Mental Status: He is oriented to person, place, and time.  Psychiatric:        Mood and Affect: Mood normal.        Behavior: Behavior normal.     Palliative Assessment/Data: 60-70%   Assessment & Plan:   HPI/Patient Profile: 68 y.o. 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  He presented to the ED secondary to dyspnea, increased weakness, fatigue and after ED evaluation he was admitted on 08/28/2024 with severe sepsis, cute influenza A, acute hypoxic respiratory failure, metastatic cholangiocarcinoma, abdominal distention, partial SBO, and others.   Palliative medicine was consulted for GOC conversations.  SUMMARY OF RECOMMENDATIONS   DNR-Limited (DNR/DNI) Continue current scope of treatment Patient hopeful for improvement Time for outcomes Palliative medicine will follow-up in 2 days for ongoing GOC conversations  Symptom Management:  Per primary team Palliative medicine is available to assist as needed  Code Status: DNR - Limited (DNR/DNI)  Prognosis:  Unable to determine  Discharge Planning:  To Be Determined   Discussed with: Patient, medical team, nursing team    Thank you for allowing us  to participate in the care of Rasheed Welty PMT will continue to support  holistically.  Time Total: 80 min  Detailed review of medical records (labs, imaging, vital signs), medically appropriate exam, discussed with treatment team, counseling and education to patient, family, & staff, documenting clinical information, medication management, coordination of  care  Signed by: Marvin Kays, NP Palliative Medicine Team  Team Phone # [phone] (Nights/Weekends)  09/03/2024, 2:36 PM  "

## 2024-09-03 NOTE — Progress Notes (Signed)
 Physical Therapy Treatment Patient Details Name: Marvin Carr MRN: [id number] DOB: 1956/09/01 Today's Date: 09/03/2024   History of Present Illness Marvin Carr  is a 68 y.o. 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  - She presents to ED today secondary to dyspnea, lysed weakness, fatigue, he denies chest pain, reports orthopnea, cough, nonproductive, fever and chills at home, he has diarrhea which is chronic, forts weakness during ambulation yesterday where he felt his legs gave up or he had a fall, had oncology follow-up appointment today where he was noted to be febrile 100.3, tachypneic and tachycardic so he was brought to ED for further evaluation.  -In ED workup significant for Lones AF pneumonia, CTA chest negative for PE but significant for multifocal pneumonia, labs were significant for  Creatinine, elevated lactic acid at 4.1, improved with IV fluids, his respiratory panel positive for influenza A, negative UA, [facility] hospitalist consulted to admit.    PT Comments  Patient demonstrates increased endurance/distance for gait training ambulating in room using RW without loss of balance. Patient unable to ambulate outside of room due to SOB with difficulty breathing wearing a mask, required CGA/min assist for rolling to side and sitting up from side lying position mostly due to abdominal distension and discomfort. Patient tolerated staying up in chair after  therapy. Patient will benefit from continued skilled physical therapy in hospital and recommended venue below to increase strength, balance, endurance for safe ADLs and gait.      If plan is discharge home, recommend the following: A little help with walking and/or transfers;A little help with bathing/dressing/bathroom;Help with stairs or ramp for entrance;Assist for transportation;Assistance with cooking/housework   Can travel by private vehicle        Equipment Recommendations  None recommended by PT    Recommendations for Other Services       Precautions / Restrictions Precautions Precautions: Fall Recall of Precautions/Restrictions: Intact Restrictions Weight Bearing Restrictions Per Provider Order: No     Mobility  Bed Mobility Overal bed mobility: Needs Assistance Bed Mobility: Supine to Sit, Sit to Supine, Sidelying to Sit   Sidelying to sit: Min assist, Contact guard assist Supine to sit: Contact guard, Min assist Sit to supine: Modified independent (Device/Increase time), Supervision   General bed mobility comments: good return for sit to supine, but has dfficulty sitting up at bedside due to abdominal discomfort, fair carryover for log rolling and sitting up from side lying position    Transfers Overall transfer level: Needs assistance Equipment used: Rolling walker (2 wheels) Transfers: Sit to/from Stand, Bed to chair/wheelchair/BSC Sit to Stand: Supervision   Step pivot transfers: Supervision       General transfer comment: good return for transferring to/from chair, bed using RW    Ambulation/Gait Ambulation/Gait assistance: Supervision Gait Distance (Feet): 35 Feet Assistive device: Rolling walker (2 wheels) Gait Pattern/deviations: Decreased step length - right, Decreased step length - left, Decreased stride length Gait velocity: Decreased     General Gait Details: slightly labored movement with increased endurance/distance for ambulating  in room  without loss of balance, limited mostly due to fatigue, unable to ambulate out of room due to SOB when wearing mask   Stairs             Wheelchair Mobility     Tilt Bed    Modified [name] (Stroke Patients Only)       Balance Overall balance assessment: Needs assistance Sitting-balance support: Feet supported, No upper extremity supported Sitting balance-Leahy Scale: Good Sitting balance - Comments: seated EOB   Standing balance support: During functional activity, Bilateral upper extremity supported Standing balance-Leahy Scale: Fair Standing balance comment: fair/good using RW                            Communication Communication Communication: No apparent difficulties  Cognition Arousal: Alert Behavior During Therapy: WFL for tasks assessed/performed   PT - Cognitive impairments: No apparent impairments                         Following commands: Intact      Cueing Cueing Techniques: Verbal cues, Visual cues  Exercises      General Comments        Pertinent Vitals/Pain Pain Assessment Pain Assessment: Faces Faces Pain Scale: Hurts a little bit Pain Location: stomach due to distension Pain Descriptors / Indicators: Sore, Discomfort Pain Intervention(s): Monitored during session, Limited activity within patient's tolerance, Repositioned    Home Living                          Prior Function            PT Goals (current goals can now be found in the care plan section) Acute Rehab PT Goals Patient Stated Goal: return home with friends/family to assist PT Goal Formulation: With patient Time For Goal Achievement: 09/12/24 Potential to Achieve Goals: Good Progress towards PT goals: Progressing toward goals    Frequency    Min 3X/week      PT Plan      Co-evaluation              AM-PAC PT 6 Clicks Mobility   Outcome Measure  Help needed turning from your back to your side while in a flat bed  without using bedrails?: None Help needed moving from lying on your back to sitting on the side of a flat bed without using bedrails?: A Little Help needed moving to and from a bed to a chair (including a wheelchair)?: A Little Help needed standing up from a chair using your arms (e.g., wheelchair or bedside chair)?: A Little Help needed to walk in hospital room?: A Little Help needed climbing 3-5 steps with a railing? : A Lot 6 Click Score: 18    End of Session   Activity Tolerance: Patient tolerated treatment well;Patient limited by fatigue Patient left: in chair Nurse Communication: Mobility status PT Visit Diagnosis: Unsteadiness on feet (R26.81);Other abnormalities of gait and mobility (R26.89);History of falling (Z91.81);Muscle weakness (generalized) (M62.81);Difficulty in walking, not elsewhere classified (R26.2)     Time: 1040-1108 PT Time Calculation (min) (ACUTE ONLY): 28 min  Charges:    $Gait Training: 8-22 mins $Therapeutic Activity: 8-22 mins PT General Charges $$ ACUTE PT VISIT: 1 Visit                     2:32 PM, 09/03/2024 Lynwood Music, MPT Physical Therapist  with [facility] [phone] office [phone] mobile phone

## 2024-09-03 NOTE — Progress Notes (Signed)
 Patient tolerated right sided Paracentesis procedure well today and 1.6 Liters of clear yellow ascites removed with labs collected and sent to lab for processing. Patient verbalized understanding of post procedure instructions and transported via stretcher back to inpatient bed assignment at this time with no acute distress noted.

## 2024-09-03 NOTE — Progress Notes (Signed)
 "          PROGRESS NOTE  Marvin Carr FMW:[id number] DOB: [birth year] DOA: 08/28/2024 PCP: [name], MD  Brief History:  68 y.o. 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 -He presented to ED secondary to dyspnea, lysed weakness, fatigue, he denies chest pain, reports orthopnea, cough, nonproductive, fever and chills at home, he has diarrhea which is chronic, forts weakness during ambulation yesterday where he felt his legs gave up or he had a fall, had oncology follow-up appointment today where he was noted to be febrile 100.3, tachypneic and tachycardic so he was brought to ED for further evaluation. -In ED workup significant for Lones AF pneumonia, CTA chest negative for PE but significant for multifocal pneumonia, labs were significant for Creatinine, elevated lactic acid at 4.1, improved with IV fluids, his respiratory panel positive for influenza A, negative UA, [facility] consulted to admit.   Assessment/Plan:  severe sepsis -- RESOLVED  - Fever, tachycardia, dyspnea, influenza A, hypotension given concern for severe sepsis.  Blood pressure responding well to IV fluid bolus.  No need for vasopressor medications at this time.  Blood cultures NGTD, - sepsis physiology resolved   Acute influenza A -- IMPROVING  - Rapid screen positive for influenza A.  -MRSA PCR negative and minimal leukocytosis, discontinued vancomycin .  Strep pneumo negative.    -He is completing a 5 day course of tamiflu . -stable on RA   Acute hypoxic respiratory failure -- RESOLVED  - Secondary to influenza A.   -Weaned to room air.   -- he continues to complain of severe cough and DOE  -He says he was experiencing this at least 1 month prior to influenza infection.    -CT angio chest that was neg for PE.  CXR 12/22 suggesting mild pulmonary edema, r -started IV lasix >>hold now,  -added bronchodilators and added scheduled cough syrup and incentive spirometry, encouraged ambulation,  -he completed 5 days of ceftriaxone  and azithromycin .     Positive blood culture -- 1/4 blood culture reported positive for Gram pos rods in anaerobic bottle -- waiting on ID -- he was treated with IV ceftriaxone  2 gm daily x 5 doses  - likely contaminant   Metastatic cholangiocarcinoma - Noted peritoneal carcinomatosis and metastatic disease to the liver and bile duct.  Follows with oncology in the outpatient setting.  -last FOLFOX 07/29/24 -pt wants to follow up with Dr. [name]   CAD/HLD - S/p LCx stent 10/25.  Continue Brilinta .  Aspirin  discontinued during recent hospitalization per cardiology recommendations.     Abdominal distension -- he is still having flatus  -- we worry about recurrent SBO which he was just [facility] for -order CT abd/pelvis -- ordered a KUB--non diagnostic   Acute on chronic HFpEF/HTN -Appear to be exacerbated and responding to IV furosemide  40 mg daily  -- added daily weights, strict I/Os, fluid restriction -- potassium supplement ordered -hold further lasix  for now as he appears euvolemic now -- hold nifedipine  and monitor BP   Diabetes  mellitus, type 2  - Insulin  sliding scale ordered. -07/03/24 A1C--5.7  BPH - Flomax  support.   Anxiety/depression - Resumed Wellbutrin  300 mg daily.         Family Communication:   no Family at bedside  Consultants:  general surgery  Code Status:  DNR  DVT Prophylaxis:  [place]  Heparin     Procedures: As Listed in Progress Note Above  Antibiotics: Ceftriaxone zenovia 12/18>>12/22      Subjective: Patient complains of abdominal distention and mild abdominal pain.  There is no vomiting.  He has some nausea.  He states that his breathing is better but still has cough and congestion.  He is passing flatus.  No bowel movement last 3 days.  Objective: Vitals:   09/02/24 2014 09/03/24 0002 09/03/24 0606 09/03/24 0707  BP:  129/83 118/76   Pulse:  100 100   Resp:   18   Temp:  (!) 97.5 F (36.4 C) 98.3 F (36.8 C)   TempSrc:  Oral    SpO2: 92% 92% 92% 96%  Weight:   113.7 kg   Height:        Intake/Output Summary (Last 24 hours) at 09/03/2024 0734 Last data filed at 09/03/2024 0500 Gross per 24 hour  Intake 480 ml  Output 2400 ml  Net -1920 ml   Weight change:  Exam:  General:  Pt is alert, follows commands appropriately, not in acute distress HEENT: No icterus, No thrush, No neck mass, [place]/AT Cardiovascular: RRR, S1/S2, no rubs, no gallops Respiratory: Scattered bilateral crackles.  No wheezing.  Good air movement. Abdomen: Soft/+BS, non tender, mild distended, no guarding Extremities: No edema, No lymphangitis, No petechiae, No rashes, no synovitis   Data Reviewed: I have personally reviewed following labs and imaging studies Basic Metabolic Panel: Recent Labs  Lab 08/29/24 0459 08/30/24 0250 08/31/24 0423 09/02/24 0412 09/03/24 0412  NA 135 135 138 137 134*  K 3.5 3.2* 3.9 3.4* 4.1  CL 101 103 102 98 96*  CO2 25 25 27 23 30   GLUCOSE 89 103* 87 99 100*  BUN 11 10 9 9 10   CREATININE 1.26* 1.07 1.12 1.01 1.23  CALCIUM  7.8* 7.6* 8.0* 8.7* 8.6*   Liver Function Tests: Recent Labs  Lab 08/28/24 1211 08/28/24 1334  AST 126* 144*  ALT 33 38  ALKPHOS 147* 173*  BILITOT 0.3 0.4  PROT 5.5* 6.3*  ALBUMIN  2.7* 3.1*   No results for input(s): LIPASE, AMYLASE in the last 168 hours. No results for input(s): AMMONIA in the last  168 hours. Coagulation Profile: Recent Labs  Lab 08/28/24 1334  INR 1.1   CBC: Recent Labs  Lab 08/28/24 1211 08/28/24 1334 08/29/24 0459 08/30/24 0250 08/31/24 0423 09/02/24 0412 09/03/24 0412  WBC 10.2 9.8 5.4 4.2 5.5 6.3 7.7  NEUTROABS 8.6* 8.5*  --   --   --   --   --   HGB 9.4* 10.8* 8.6* 8.2* 9.5* 10.9* 10.3*  HCT 30.1* 34.4* 27.3* 26.6* 30.5* 34.5* 32.8*  MCV 99.7 100.0 98.9 99.6 99.0 96.4 97.3  PLT 235 272 213 196 212 245 236   Cardiac Enzymes: No results for input(s): CKTOTAL, CKMB, CKMBINDEX, TROPONINI in the last 168 hours. BNP: Invalid input(s): POCBNP CBG: Recent Labs  Lab 09/02/24 0732 09/02/24 1125 09/02/24 1612 09/02/24 2129 09/03/24 0732  GLUCAP 93 131* 118* 103* 111*   HbA1C: No results for input(s): HGBA1C in the last 72 hours. Urine analysis:    Component Value Date/Time   COLORURINE  YELLOW 08/28/2024 1805   APPEARANCEUR CLEAR 08/28/2024 1805   LABSPEC 1.021 08/28/2024 1805   PHURINE 5.0 08/28/2024 1805   GLUCOSEU NEGATIVE 08/28/2024 1805   HGBUR NEGATIVE 08/28/2024 1805   BILIRUBINUR NEGATIVE 08/28/2024 1805   KETONESUR NEGATIVE 08/28/2024 1805   PROTEINUR NEGATIVE 08/28/2024 1805   NITRITE NEGATIVE 08/28/2024 1805   LEUKOCYTESUR NEGATIVE 08/28/2024 1805   Sepsis Labs: @LABRCNTIP (procalcitonin:4,lacticidven:4) ) Recent Results (from the past 240 hours)  Resp panel by RT-PCR (RSV, Flu A&B, Covid) Anterior Nasal Swab     Status: Abnormal   Collection Time: 08/28/24  1:21 PM   Specimen: Anterior Nasal Swab  Result Value Ref Range Status   SARS Coronavirus 2 by RT PCR NEGATIVE NEGATIVE Final    Comment: (NOTE) SARS-CoV-2 target nucleic acids are NOT DETECTED.  The SARS-CoV-2 RNA is generally detectable in upper respiratory specimens during the acute phase of infection. The lowest concentration of SARS-CoV-2 viral copies this assay can detect is 138 copies/mL. A negative result does not preclude SARS-Cov-2 infection  and should not be used as the sole basis for treatment or other patient management decisions. A negative result may occur with  improper specimen collection/handling, submission of specimen other than nasopharyngeal swab, presence of viral mutation(s) within the areas targeted by this assay, and inadequate number of viral copies(<138 copies/mL). A negative result must be combined with clinical observations, patient history, and epidemiological information. The expected result is Negative.  Fact Sheet for Patients:  bloggercourse.com  Fact Sheet for Healthcare Providers:  seriousbroker.it  This test is no t yet approved or cleared by the [place]  FDA and  has been authorized for detection and/or diagnosis of SARS-CoV-2 by FDA under an Emergency Use Authorization (EUA). This EUA will remain  in effect (meaning this test can be used) for the duration of the COVID-19 declaration under Section 564(b)(1) of the Act, 21 U.S.C.section 360bbb-3(b)(1), unless the authorization is terminated  or revoked sooner.       Influenza A by PCR POSITIVE (A) NEGATIVE Final   Influenza B by PCR NEGATIVE NEGATIVE Final    Comment: (NOTE) The Xpert Xpress SARS-CoV-2/FLU/RSV plus assay is intended as an aid in the diagnosis of influenza from Nasopharyngeal swab specimens and should not be used as a sole basis for treatment. Nasal washings and aspirates are unacceptable for Xpert Xpress SARS-CoV-2/FLU/RSV testing.  Fact Sheet for Patients: bloggercourse.com  Fact Sheet for Healthcare Providers: seriousbroker.it  This test is not yet approved or cleared by the [place]  FDA and has been authorized for detection and/or diagnosis of SARS-CoV-2 by FDA under an Emergency Use Authorization (EUA). This EUA will remain in effect (meaning this test can be used) for the duration of the COVID-19  declaration under Section 564(b)(1) of the Act, 21 U.S.C. section 360bbb-3(b)(1), unless the authorization is terminated or revoked.     Resp Syncytial Virus by PCR NEGATIVE NEGATIVE Final    Comment: (NOTE) Fact Sheet for Patients: bloggercourse.com  Fact Sheet for Healthcare Providers: seriousbroker.it  This test is not yet approved or cleared by the [place]  FDA and has been authorized for detection and/or diagnosis of SARS-CoV-2 by FDA under an Emergency Use Authorization (EUA). This EUA will remain in effect (meaning this test can be used) for the duration of the COVID-19 declaration under Section 564(b)(1) of the Act, 21 U.S.C. section 360bbb-3(b)(1), unless the authorization is terminated or revoked.  Performed at [facility], [address] [place], [place] [address]   C Difficile Quick  Screen w PCR reflex     Status: None   Collection Time: 08/28/24  1:22 PM   Specimen: STOOL  Result Value Ref Range Status   C Diff antigen NEGATIVE NEGATIVE Final   C Diff toxin NEGATIVE NEGATIVE Final   C Diff interpretation No C. difficile detected.  Final    Comment: Performed at [facility], [address], [place], [place] [address]  Culture, blood (Routine x 2)     Status: None (Preliminary result)   Collection Time: 08/28/24  1:34 PM   Specimen: BLOOD  Result Value Ref Range Status   Specimen Description   Final    BLOOD BLOOD RIGHT ARM Performed at [facility], [address] [place], [place] [address]    Special Requests   Final    BOTTLES DRAWN AEROBIC AND ANAEROBIC Blood Culture adequate volume Performed at [facility], [address] [place], [place] [address]    Culture  Setup Time   Final    GRAM POSITIVE RODS ANAEROBIC BOTTLE ONLY Gram Stain Report Called to,Read Back By and Verified With: B BROOKS,RN@0300  09/02/24 MK GS REVA GPR BY DD 09/02/2024 @ 0502 Performed at [facility], [address],  [place], [place] [address]    Culture GRAM POSITIVE RODS  Final   Report Status PENDING  Incomplete  Culture, blood (Routine x 2)     Status: None   Collection Time: 08/28/24  1:39 PM   Specimen: BLOOD  Result Value Ref Range Status   Specimen Description BLOOD BLOOD RIGHT ARM  Final   Special Requests   Final    BOTTLES DRAWN AEROBIC AND ANAEROBIC Blood Culture adequate volume   Culture   Final    NO GROWTH 5 DAYS Performed at [facility], [address], [place], [place] [address]    Report Status 09/02/2024 FINAL  Final  MRSA Next Gen by PCR, Nasal     Status: None   Collection Time: 08/28/24 10:28 PM  Result Value Ref Range Status   MRSA by PCR Next Gen NOT DETECTED NOT DETECTED Final    Comment: (NOTE) The GeneXpert MRSA Assay (FDA approved for NASAL specimens only), is one component of a comprehensive MRSA colonization surveillance program. It is not intended to diagnose MRSA infection nor to guide or monitor treatment for MRSA infections. Test performance is not FDA approved in patients less than 21 years old. Performed at [facility], [address], [place], [place] [address]      Scheduled Meds:  atorvastatin   20 mg Oral q1800   buPROPion   300 mg Oral Daily   dextromethorphan   30 mg Oral BID   docusate sodium   200 mg Oral BID   famotidine   20 mg Oral QHS   fenofibrate   160 mg Oral Daily   furosemide   40 mg Intravenous Daily   heparin   5,000 Units Subcutaneous Q8H   ipratropium-albuterol   3 mL Nebulization TID   midodrine   5 mg Oral TID WC   NIFEdipine   30 mg Oral Daily   pantoprazole   40 mg Oral Daily   pramipexole   0.75 mg Oral TID   tamsulosin   0.4 mg Oral Daily   ticagrelor   90 mg Oral BID   zolpidem   5 mg Oral QHS   Continuous Infusions:  Procedures/Studies: DG Abd 1 View Result Date: 09/02/2024 CLINICAL DATA:  Abdominal discomfort.  Abdominal distension.  Pain. EXAM: ABDOMEN - 1 VIEW COMPARISON:  Radiograph 08/19/2024 FINDINGS: Dilated air-filled loops of  bowel  in the left mid abdomen, may represent redundant colon or small bowel. Moderate formed stool in the right and transverse colon. No evidence of free air on the supine views. Multiple pelvic phleboliths. IMPRESSION: Dilated air-filled loops of bowel in the left mid abdomen, may represent redundant colon or small bowel. Consider further evaluation with CT. Electronically Signed   By: Andrea Gasman M.D.   On: 09/02/2024 15:21   DG CHEST PORT 1 VIEW Result Date: 09/01/2024 CLINICAL DATA:  Dyspnea. Influenza A. Shortness of breath. Chest tightness. EXAM: PORTABLE CHEST 1 VIEW COMPARISON:  08/29/2019 radiograph and CT FINDINGS: Right chest port remains in place. Stable heart size and mediastinal contours. Diffuse interstitial coarsening, with slight progression from prior exam. Left pleural effusion and basilar opacity are similar. No pneumothorax. IMPRESSION: 1. Diffuse interstitial coarsening, with slight progression from prior exam, may represent atypical infection or pulmonary edema. 2. Left pleural effusion and basilar opacity are similar. Electronically Signed   By: Andrea Gasman M.D.   On: 09/01/2024 16:47   CT Angio Chest PE W and/or Wo Contrast Result Date: 08/28/2024 CLINICAL DATA:  Concern for pulmonary embolism. EXAM: CT ANGIOGRAPHY CHEST WITH CONTRAST TECHNIQUE: Multidetector CT imaging of the chest was performed using the standard protocol during bolus administration of intravenous contrast. Multiplanar CT image reconstructions and MIPs were obtained to evaluate the vascular anatomy. RADIATION DOSE REDUCTION: This exam was performed according to the departmental dose-optimization program which includes automated exposure control, adjustment of the mA and/or kV according to patient size and/or use of iterative reconstruction technique. CONTRAST:  75mL OMNIPAQUE  IOHEXOL  350 MG/ML SOLN COMPARISON:  Chest radiograph dated 08/28/2024. FINDINGS: Cardiovascular: There is no cardiomegaly or  pericardial effusion. There is 3 vessel coronary vascular calcification. Mild atherosclerotic calcification of the thoracic aorta. No aneurysmal dilatation or dissection. The origins of the great vessels of the aortic arch appear patent. Right-sided Port-A-Cath with tip over central SVC. No pulmonary artery embolus identified. Mediastinum/Nodes: No hilar or mediastinal adenopathy. The esophagus is grossly unremarkable no mediastinal fluid collection. Lungs/Pleura: Small left pleural effusion with partial compressive atelectasis of the left lower lobe. Patchy area of streaky and nodular density in the left upper lobe and lingula consistent with pneumonia. Aspiration is not excluded. Additional reticulonodular density at the right lung base may represent atelectasis or infiltrate. No pneumothorax. The central airways are patent. Upper Abdomen: Partially visualized ascites Musculoskeletal: Degenerative changes of the spine. No acute osseous pathology. Review of the MIP images confirms the above findings. IMPRESSION: 1. No CT evidence of pulmonary artery embolus. 2. Left upper lobe and lingular pneumonia. Aspiration is not excluded. 3. Small left pleural effusion with partial compressive atelectasis of the left lower lobe. 4. Partially visualized ascites. 5.  Aortic Atherosclerosis (ICD10-I70.0). Electronically Signed   By: Vanetta Chou M.D.   On: 08/28/2024 18:17   DG Chest Port 1 View Result Date: 08/28/2024 CLINICAL DATA:  Questionable sepsis - evaluate for abnormality EXAM: PORTABLE CHEST - 1 VIEW COMPARISON:  07/16/2024 FINDINGS: Right chest port is similarly positioned terminating in the lower SVC. Unchanged trace left pleural effusion. Streaky left basilar atelectasis. No pneumothorax or new airspace consolidation. The cardiac silhouette is at the upper limits of normal, likely accentuated by AP technique. Aortic atherosclerosis. No acute fracture or destructive lesions. Multilevel thoracic osteophytosis.  IMPRESSION: Unchanged trace left pleural effusion with streaky left basilar atelectasis. Electronically Signed   By: Rogelia Myers M.D.   On: 08/28/2024 13:35   DG Abd Portable 1V Result Date:  08/19/2024 CLINICAL DATA:  Bowel obstruction. EXAM: PORTABLE ABDOMEN - 1 VIEW COMPARISON:  08/18/2024 FINDINGS: NG tube tip is in the proximal stomach. Although side port of the NG tube is not clearly visualized, it is likely in the distal esophagus. Gaseous distention of transverse colon noted without frank dilatation. Gas is visible in the nondistended rectum. Air is seen scattered through nondilated small bowel loops. IMPRESSION: 1. NG tube tip is in the proximal stomach. Although side port of the NG tube is not clearly visualized, it is likely in the distal esophagus. 2. Nonspecific bowel gas pattern. Electronically Signed   By: Camellia Candle M.D.   On: 08/19/2024 07:21   DG Abd 1 View Result Date: 08/18/2024 CLINICAL DATA:  Enteric catheter placement EXAM: ABDOMEN - 1 VIEW COMPARISON:  08/17/2024 FINDINGS: Frontal view of the lower chest and upper abdomen demonstrates stable position of the enteric catheter, tip projecting over the gastric fundus. Side port projects proximally 2.5 cm above the left hemidiaphragm. Bowel gas pattern is unremarkable. Stable left pleural effusion and left lower lobe consolidation. IMPRESSION: 1. Enteric catheter tip projecting over the gastric fundus, with side port projecting 2.5 cm above the left hemidiaphragm. 2. Stable left pleural effusion and left basilar consolidation. Electronically Signed   By: Ozell Daring M.D.   On: 08/18/2024 17:10   DG Abd 1 View Result Date: 08/17/2024 EXAM: 1 VIEW XRAY OF THE ABDOMEN 08/17/2024 12:28:00 AM COMPARISON: None available. CLINICAL HISTORY: Encounter for nasogastric (NG) tube placement. FINDINGS: LINES, TUBES AND DEVICES: Enteric tube courses below the hemidiaphragm with the tip overlying the gastric lumen and side port not well  visualized, likely in the region of the gastroesophageal junction. BOWEL: Nonobstructive bowel gas pattern. SOFT TISSUES: No abnormal calcifications. BONES: No acute fracture. LIMITATIONS: Right flank and pelvis collimated off view. IMPRESSION: 1. Enteric tube tip overlies the gastric lumen; side port is not well visualized and is likely at the gastroesophageal junction, consider advancing the tube slightly to ensure the side port is intragastric. Electronically signed by: Morgane Naveau MD 08/17/2024 12:33 AM EST RP Workstation: [id number]   CT ANGIO GI BLEED Result Date: 08/16/2024 EXAM: CTA ABDOMEN AND PELVIS WITH CONTRAST 08/16/2024 03:23:46 PM TECHNIQUE: CTA images of the abdomen and pelvis with intravenous contrast. 100 mL iohexol  (OMNIPAQUE ) 350 MG/ML injection. Three-dimensional MIP/volume rendered formations were performed. Automated exposure control, iterative reconstruction, and/or weight based adjustment of the mA/kV was utilized to reduce the radiation dose to as low as reasonably achievable. COMPARISON: 05/15/2024 and 06/03/2024. CLINICAL HISTORY: Suspected GI bleed. FINDINGS: VASCULATURE: GI BLEED: No active extravasation of contrast within the GI tract. AORTA: No acute finding. No abdominal aortic aneurysm. No dissection. CELIAC TRUNK: No acute finding. No occlusion or significant stenosis. SUPERIOR MESENTERIC ARTERY: No acute finding. No occlusion or significant stenosis. INFERIOR MESENTERIC ARTERY: No acute finding. No occlusion or significant stenosis. RENAL ARTERIES: No acute finding. No occlusion or significant stenosis. ILIAC ARTERIES: No acute finding. No occlusion or significant stenosis. ABDOMEN/PELVIS: LOWER CHEST: Visualized portion of the lower chest demonstrates no acute abnormality. LIVER: Hepatic low density is again noted consistent with metastatic disease. GALLBLADDER AND BILE DUCTS: Cholelithiasis. No biliary ductal dilatation. SPLEEN: The spleen is unremarkable. PANCREAS: The  pancreas is unremarkable. ADRENAL GLANDS: Bilateral adrenal glands demonstrate no acute abnormality. KIDNEYS, URETERS AND BLADDER: No stones in the kidneys or ureters. No hydronephrosis. No perinephric or periureteral stranding. Urinary bladder is unremarkable. GI AND BOWEL: Severely dilated small bowel loops are noted with intramural gas in  multiple small bowel loops consistent with ischemic bowel disease. No colonic dilatation is noted. Stomach and duodenal sweep demonstrate no acute abnormality. REPRODUCTIVE: Reproductive organs are unremarkable. PERITONEUM AND RETROPERITONEUM: Mild ascites is noted. 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 No free air. LYMPH NODES: No lymphadenopathy. BONES AND SOFT TISSUES: Status post bilateral hip arthroplasties. No acute abnormality of the bones. No acute soft tissue abnormality. IMPRESSION: 1. Severely dilated small bowel loops with intramural gas, consistent with ischemic bowel disease. This finding was discussed with Dr. Melvenia at 3:38 pm on 08/16/2024. 2. No active gastrointestinal bleeding identified. 3. Hepatic low-density lesions consistent with metastatic disease. 4. 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 Electronically signed by: Lynwood Seip MD 08/16/2024 03:40 PM EST RP Workstation: [id number]   US  ASCITES (ABDOMEN LIMITED) Result Date: 08/15/2024 CLINICAL DATA:  Patient with history of cholangiocarcinoma with malignant ascites, recently started on diuretic medication. Request for possible therapeutic paracentesis. EXAM: LIMITED ABDOMEN ULTRASOUND FOR ASCITES TECHNIQUE: Limited ultrasound survey for ascites was performed in all four abdominal quadrants. COMPARISON:  None Available. FINDINGS: Limited abdominal ultrasound of all four abdominal quadrants shows very small midline right upper quadrant pocket of ascites. No other areas of peritoneal fluid seen. IMPRESSION: Very small midline right upper  quadrant pocket of ascites, not amenable to safe paracentesis. No procedure performed. Electronically Signed   By: Wilkie Lent M.D.   On: 08/15/2024 14:08    Alm Schneider, DO  [facility]  If 7PM-7AM, please contact night-coverage [email or web address] Password [facility] 09/03/2024, 7:34 AM   LOS: 6 days   "

## 2024-09-03 NOTE — Consult Note (Signed)
 Reason for Consult: Small bowel obstruction Referring Physician: Dr. Evonnie Ade Marvin Carr is an 68 y.o. male.  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  He was managed conservatively as he is a high risk for any surgical intervention.  He did have an NG tube placed and his bowel obstruction resolved.  Since that discharge earlier this month, he states he has not had any significant nausea or vomiting.  He was admitted back to [facility] on 08/29/2024 with severe sepsis secondary to influenza A.  His sepsis has mostly resolved but yesterday evening he started having nausea and vomiting.  He states his last bowel movement was 4 days ago.  He is currently on a clear liquid diet.  He does have a limited DNR at the present time.  He denies any significant abdominal pain.  He states he does feel distended.  He is passing flatus.  Past Medical History:  Diagnosis Date   Anxiety    Arthritis    Bladder cancer (HCC) 09/11/2009   CAD (coronary artery disease), native coronary artery    a. Mildly elevated troponin 03/2013, cath with nonobstructive disease including 50% AV groove distal stenosis before large OM   Depression    Essential hypertension    Headache(784.0)    History of migraines   Hyperglycemia    Metastatic cancer to liver [facility])    Mixed hyperlipidemia    Neuropathy    Obesity    Port-A-Cath in place 09/30/2021   Pre-diabetes    Seasonal allergies    Sleep apnea    On CPAP    Past Surgical History:  Procedure Laterality Date   BIOPSY  09/23/2021   Procedure: BIOPSY;   Surgeon: Eartha Angelia Sieving, MD;  Location: [facility];  Service: Gastroenterology;;   COLONOSCOPY  06/28/2011   Procedure: COLONOSCOPY;  Surgeon: Claudis RAYMOND Rivet, MD;  Location: [facility];  Service: Endoscopy;  Laterality: N/A;   COLONOSCOPY WITH PROPOFOL  N/A 09/23/2021   Procedure: COLONOSCOPY WITH PROPOFOL ;  Surgeon: Eartha Angelia Sieving, MD;  Location: [facility];  Service: Gastroenterology;  Laterality: N/A;  940   CORONARY STENT INTERVENTION N/A 07/04/2024   Procedure: CORONARY STENT INTERVENTION;  Surgeon: Wendel Lurena POUR, MD;  Location: [facility];  Service: Cardiovascular;  Laterality: N/A;   ESOPHAGOGASTRODUODENOSCOPY (EGD) WITH PROPOFOL  N/A 09/23/2021   Procedure: ESOPHAGOGASTRODUODENOSCOPY (EGD) WITH PROPOFOL ;  Surgeon: Eartha Angelia Sieving, MD;  [facility]: [facility];  Service: Gastroenterology;  Laterality: N/A;   IR IMAGING GUIDED PORT INSERTION  09/28/2021   IR PARACENTESIS  09/28/2021   JOINT REPLACEMENT Right    hip   LEFT HEART CATH AND CORONARY ANGIOGRAPHY N/A 07/04/2024   Procedure: LEFT HEART CATH AND CORONARY ANGIOGRAPHY;  Surgeon: Wendel Lurena POUR, MD;  Location: [facility];  Service: Cardiovascular;  Laterality: N/A;   LEFT HEART CATHETERIZATION WITH CORONARY ANGIOGRAM N/A 03/31/2013   Procedure: LEFT HEART CATHETERIZATION WITH CORONARY ANGIOGRAM;  Surgeon: Lynwood Schilling, MD;  Location: [facility] CATH LAB;  Service: Cardiovascular;  Laterality: N/A;   POLYPECTOMY  09/23/2021   Procedure: POLYPECTOMY;  Surgeon: Eartha Angelia Sieving, MD;  Location:  [facility];  Service: Gastroenterology;;   TOTAL HIP ARTHROPLASTY Left 01/15/2024   Procedure: ARTHROPLASTY, HIP, TOTAL, ANTERIOR APPROACH;  Surgeon: Ernie Cough, MD;  Location: [facility] OR[facility];  Service: Orthopedics;  Laterality: Left;   TURBT  09/11/2009    Family History  Problem Relation Age of Onset   Cancer Father        MDS   COPD Father    Cancer Paternal Grandfather         prostate cancer    Social History:  reports that he quit smoking about 13 years ago. His smoking use included cigarettes. He started smoking about 23 years ago. He has a 5 pack-year smoking history. He has been exposed to tobacco smoke. He has never used smokeless tobacco. He reports current alcohol use. He reports that he does not use drugs.  Allergies: Allergies[1]  Medications: I have reviewed the patient's current medications.  Results for orders placed or performed during the hospital encounter of 08/28/24 (from the past 48 hours)  Glucose, capillary     Status: Abnormal   Collection Time: 09/01/24 11:23 AM  Result Value Ref Range   Glucose-Capillary 114 (H) 70 - 99 mg/dL    Comment: Glucose reference range applies only to samples taken after fasting for at least 8 hours.  Glucose, capillary     Status: None   Collection Time: 09/01/24  4:19 PM  Result Value Ref Range   Glucose-Capillary 92 70 - 99 mg/dL    Comment: Glucose reference range applies only to samples taken after fasting for at least 8 hours.  Glucose, capillary     Status: Abnormal   Collection Time: 09/01/24  8:47 PM  Result Value Ref Range   Glucose-Capillary 104 (H) 70 - 99 mg/dL    Comment: Glucose reference range applies only to samples taken after fasting for at least 8 hours.  CBC     Status: Abnormal   Collection Time: 09/02/24  4:12 AM  Result Value Ref Range   WBC 6.3 4.0 - 10.5 K/uL   RBC 3.58 (L) 4.22 - 5.81 MIL/uL   Hemoglobin 10.9 (L) 13.0 - 17.0 g/dL   HCT 65.4 (L) 60.9 - 47.9 %   MCV 96.4 80.0 - 100.0 fL   MCH 30.4 26.0 - 34.0 pg   MCHC 31.6 30.0 - 36.0 g/dL   RDW 80.0 (H) 88.4 - 84.4 %   Platelets 245 150 - 400 K/uL   nRBC 0.3 (H) 0.0 - 0.2 %    Comment: Performed at [facility], [address] [place], [place] [address]  Basic metabolic panel with GFR     Status: Abnormal   Collection Time: 09/02/24  4:12 AM  Result Value Ref Range   Sodium 137 135 - 145 mmol/L   Potassium 3.4 (L) 3.5 -  5.1 mmol/L   Chloride 98 98 - 111 mmol/L   CO2 23 22 - 32 mmol/L   Glucose, Bld 99 70 - 99 mg/dL    Comment: Glucose reference range applies only to samples taken after fasting for at least 8 hours.   BUN 9 8 - 23 mg/dL   Creatinine, Ser 8.98 0.61 - 1.24 mg/dL   Calcium  8.7 (L) 8.9 - 10.3 mg/dL   GFR, Estimated >39 >39 mL/min    Comment: (NOTE) Calculated using the CKD-EPI Creatinine Equation (2021)    Anion gap 16 (H) 5 - 15    Comment: Performed at [facility], [address] [place], [place] [address]  Procalcitonin     Status: None   Collection Time: 09/02/24  4:12 AM  Result Value Ref Range   Procalcitonin 0.58 ng/mL    Comment: (NOTE)   Sepsis PCT Algorithm          Lower Respiratory Tract Infection                                         PCT Algorithm -----------------------------------------------------------------  <0.5 ng/mL                    <0.10 ng/mL  Associated with low           Antibiotic therapy strongly   risk for progression          discouraged. Indicates absence   to severe sepsis              of bacteria infection  and/or septic shock             --------------------------------------------------------------  0.5-2.0 ng/mL                 0.10-0.25 ng/mL  Recommended to retest         Antibiotic therapy discouraged.  PCT within 6-24 hours         Bacterial infection unlikely  ------------------------------------------------------------  >2 ng/mL                      0.26-0.50 ng/mL  Associated with high risk     Antibiotic therapy encouraged.  for progression to severe     Bacterial infection possible  sepsis/and or septic shock    ------------------------------                                 >0.50 ng/mL                                Antibiotic therapy strongly                                 encouraged.                                Suggestive of presence of                                 bacterial infection.                                  -------------------------------------------------------------------  < or = 0.50 ng/mL OR          < or = 0.25 OR 80% decrease in PCT  80% decrease in PCT           Antibiotic therapy   Antibiotic therapy may        may be discontinued  be discontinued                                 Performed at [facility], [address] [place], [place] [address]   Glucose, capillary     Status: None   Collection Time: 09/02/24  7:32 AM  Result Value Ref Range   Glucose-Capillary 93 70 - 99 mg/dL    Comment: Glucose reference range applies only to samples taken after fasting for at least 8 hours.  Glucose, capillary     Status: Abnormal   Collection Time: 09/02/24 11:25 AM  Result Value Ref Range   Glucose-Capillary 131 (H) 70 - 99 mg/dL    Comment: Glucose reference range applies only to samples taken after fasting for at least 8 hours.  Glucose, capillary     Status: Abnormal   Collection Time: 09/02/24  4:12 PM  Result Value Ref Range   Glucose-Capillary 118 (H) 70 - 99 mg/dL    Comment: Glucose reference range applies only to samples taken after fasting for at least 8 hours.  Glucose, capillary     Status: Abnormal   Collection Time: 09/02/24  9:29 PM  Result Value Ref Range   Glucose-Capillary 103 (H) 70 - 99 mg/dL    Comment: Glucose reference range applies only to samples taken after fasting for at least 8 hours.   Comment 1 Notify RN    Comment 2 Document in Chart   Basic metabolic panel with GFR     Status: Abnormal   Collection Time: 09/03/24  4:12 AM  Result Value Ref Range   Sodium 134 (L) 135 - 145 mmol/L   Potassium 4.1 3.5 - 5.1 mmol/L    Comment: Delta check noted    Chloride 96 (L) 98 - 111 mmol/L   CO2 30 22 - 32 mmol/L   Glucose, Bld 100 (H) 70 - 99 mg/dL    Comment: Glucose reference range applies only to samples taken after fasting for at least 8 hours.   BUN 10 8 - 23 mg/dL   Creatinine, Ser 8.76 0.61 - 1.24 mg/dL   Calcium  8.6 (L) 8.9 - 10.3 mg/dL   GFR,  Estimated >39 >39 mL/min    Comment: (NOTE) Calculated using the CKD-EPI Creatinine Equation (2021)    Anion gap 8 5 - 15    Comment: Performed at [facility], [address], [place], [place] [address]  CBC     Status: Abnormal   Collection Time: 09/03/24  4:12 AM  Result Value Ref Range   WBC 7.7 4.0 - 10.5 K/uL   RBC 3.37 (L) 4.22 - 5.81 MIL/uL   Hemoglobin 10.3 (L) 13.0 - 17.0 g/dL   HCT 67.1 (L) 60.9 - 47.9 %   MCV 97.3 80.0 - 100.0 fL   MCH 30.6 26.0 - 34.0 pg   MCHC 31.4 30.0 - 36.0 g/dL   RDW 79.6 (H) 88.4 - 84.4 %   Platelets 236 150 - 400 K/uL   nRBC 0.3 (H) 0.0 - 0.2 %    Comment: Performed at [facility], [address], [name], [place] [address]  Procalcitonin     Status: None   Collection Time: 09/03/24  4:12 AM  Result Value Ref Range   Procalcitonin 0.68 ng/mL    Comment: (NOTE)   Sepsis PCT Algorithm          Lower Respiratory Tract Infection                                         PCT Algorithm -----------------------------------------------------------------  <  0.5 ng/mL                    <0.10 ng/mL  Associated with low           Antibiotic therapy strongly   risk for progression          discouraged. Indicates absence   to severe sepsis              of bacteria infection  and/or septic shock             --------------------------------------------------------------  0.5-2.0 ng/mL                 0.10-0.25 ng/mL  Recommended to retest         Antibiotic therapy discouraged.  PCT within 6-24 hours         Bacterial infection unlikely  ------------------------------------------------------------  >2 ng/mL                      0.26-0.50 ng/mL  Associated with high risk     Antibiotic therapy encouraged.  for progression to severe     Bacterial infection possible  sepsis/and or septic shock    ------------------------------                                 >0.50 ng/mL                                Antibiotic therapy strongly                                  encouraged.                                Suggestive of presence of                                 bacterial infection.                                 -------------------------------------------------------------------  < or = 0.50 ng/mL OR          < or = 0.25 OR 80% decrease in PCT  80% decrease in PCT           Antibiotic therapy   Antibiotic therapy may        may be discontinued  be discontinued                                 Performed at [facility], [address] [place], [place] [address]   Glucose, capillary     Status: Abnormal   Collection Time: 09/03/24  7:32 AM  Result Value Ref Range   Glucose-Capillary 111 (H) 70 - 99 mg/dL    Comment: Glucose reference range applies only to samples taken after fasting for at least 8 hours.    CT ABDOMEN PELVIS W CONTRAST Result Date: 09/03/2024 CLINICAL DATA:  Abdominal pain and distension. Bowel obstruction suspected. History of metastatic cholangiocarcinoma. * Tracking Code: BO * EXAM: CT ABDOMEN AND  PELVIS WITH CONTRAST TECHNIQUE: Multidetector CT imaging of the abdomen and pelvis was performed using the standard protocol following bolus administration of intravenous contrast. RADIATION DOSE REDUCTION: This exam was performed according to the departmental dose-optimization program which includes automated exposure control, adjustment of the mA and/or kV according to patient size and/or use of iterative reconstruction technique. CONTRAST:  OMNIPAQUE  IOHEXOL  300 MG/ML  SOLN COMPARISON:  Abdomen and pelvis CTA 08/16/2024. FINDINGS: Lower chest: Moderate left pleural effusion with left base collapse/consolidation, similar to 08/16/2024. Hepatobiliary: 13 mm low-density lesion in the dome of the liver is stable in the interval. Additional low-density lesions in the left and right hepatic lobes measure up to 2.2 cm also stable since immediate prior study and remain compatible with metastatic disease. Gallbladder is distended with  intraluminal debris and calcified stone. No intrahepatic or extrahepatic biliary dilation. Pancreas: No focal mass lesion. No dilatation of the main duct. No intraparenchymal cyst. No peripancreatic edema. Spleen: No splenomegaly. No suspicious focal mass lesion. Adrenals/Urinary Tract: No adrenal nodule or mass. Tiny well-defined homogeneous low-density lesions in both kidneys are too small to characterize but are statistically most likely benign and probably cysts. No followup imaging is recommended. No evidence for hydroureter. Bladder is obscured by beam hardening artifact from bilateral hip replacement. Stomach/Bowel: Stomach is moderately distended, similar to prior. Diffuse small bowel dilatation is stable to mildly progressive in the interval. Small bowel loops measure up to 5.3 cm diameter on the current study in the left upper quadrant. Small bowel pneumatosis seen previously has resolved. A discrete transition zone is not identified but small bowel in the right lower quadrant is completely decompressed. Colon is stool filled but nondilated. Vascular/Lymphatic: There is moderate atherosclerotic calcification of the abdominal aorta without aneurysm. There is no gastrohepatic or hepatoduodenal ligament lymphadenopathy. No retroperitoneal or mesenteric lymphadenopathy. No pelvic sidewall lymphadenopathy. Reproductive: Prostate gland is obscured by beam hardening artifact. Other: Small to moderate volume ascites appears loculated in shows areas of apparent peritoneal enhancement, features compatible with carcinomatosis. There is nodularity in the omentum and mesentery suspicious for metastatic disease. Musculoskeletal: Bilateral hip replacement. No worrisome lytic or sclerotic osseous abnormality. IMPRESSION: 1. Diffuse small bowel dilatation is stable to mildly progressive in the interval. A discrete transition zone is not identified but small bowel in the right lower quadrant is completely decompressed.  Imaging features are compatible with small bowel obstruction. 2. Small to moderate volume ascites appears loculated and shows areas of apparent peritoneal enhancement, features compatible with carcinomatosis. There is nodularity in the omentum and mesentery suspicious for metastatic disease. 3. Moderate left pleural effusion with left base collapse/consolidation, similar to 08/16/2024. 4. Stable low-density lesions in the liver compatible with metastatic disease. 5. Cholelithiasis. 6.  Aortic Atherosclerosis (ICD10-I70.0). Electronically Signed   By: Camellia Candle M.D.   On: 09/03/2024 10:50   DG Abd 1 View Result Date: 09/02/2024 CLINICAL DATA:  Abdominal discomfort.  Abdominal distension.  Pain. EXAM: ABDOMEN - 1 VIEW COMPARISON:  Radiograph 08/19/2024 FINDINGS: Dilated air-filled loops of bowel in the left mid abdomen, may represent redundant colon or small bowel. Moderate formed stool in the right and transverse colon. No evidence of free air on the supine views. Multiple pelvic phleboliths. IMPRESSION: Dilated air-filled loops of bowel in the left mid abdomen, may represent redundant colon or small bowel. Consider further evaluation with CT. Electronically Signed   By: Andrea Gasman M.D.   On: 09/02/2024 15:21    ROS:  Pertinent items are noted in  HPI.  Blood pressure 118/76, pulse 100, temperature 98.3 F (36.8 C), resp. rate 18, height 6' (1.829 m), weight 113.7 kg, SpO2 96%. Physical Exam: Pleasant white male sitting in the chair in no acute distress Head is normocephalic, atraumatic Lungs with some scattered crackles but good air movement.  No wheezing noted. Abdomen is soft with areas of firmness especially in the left upper quadrant and right side of abdomen.  No rigidity noted.  Mild distention noted.  CT scan images personally reviewed Assessment/Plan: Impression: Partial small bowel obstruction most likely secondary to carcinomatosis.  Patient also has increasing ascites that is  loculated along the right side of the abdomen.  This is displacing the colon and small bowel.  Patient has had paracentesis in the past. Overall, patient is not an operative candidate and options are limited as to treatment for his bowel dysfunction.  Does not need an NG tube at the present time.  Discussed with Dr. Evonnie and agree with paracentesis to see if this helps relieve his symptoms.  Should his bowel obstruction progress, they decompressive gastrostomy tube placed by IR would be indicated.  Will follow with you.  Oneil Budge 09/03/2024, 11:13 AM         [1] No Known Allergies

## 2024-09-03 NOTE — Plan of Care (Signed)
   Problem: Education: Goal: Knowledge of General Education information will improve Description: Including pain rating scale, medication(s)/side effects and non-pharmacologic comfort measures Outcome: Progressing   Problem: Clinical Measurements: Goal: Respiratory complications will improve Outcome: Progressing   Problem: Activity: Goal: Risk for activity intolerance will decrease Outcome: Progressing

## 2024-09-04 DIAGNOSIS — K56609 Unspecified intestinal obstruction, unspecified as to partial versus complete obstruction: Secondary | ICD-10-CM | POA: Diagnosis not present

## 2024-09-04 DIAGNOSIS — J09X1 Influenza due to identified novel influenza A virus with pneumonia: Secondary | ICD-10-CM | POA: Diagnosis not present

## 2024-09-04 DIAGNOSIS — C221 Intrahepatic bile duct carcinoma: Secondary | ICD-10-CM | POA: Diagnosis not present

## 2024-09-04 LAB — CULTURE, BLOOD (ROUTINE X 2): Special Requests: ADEQUATE

## 2024-09-04 LAB — GLUCOSE, CAPILLARY
Glucose-Capillary: 105 mg/dL — ABNORMAL HIGH (ref 70–99)
Glucose-Capillary: 124 mg/dL — ABNORMAL HIGH (ref 70–99)
Glucose-Capillary: 111 mg/dL — ABNORMAL HIGH (ref 70–99)
Glucose-Capillary: 124 mg/dL — ABNORMAL HIGH (ref 70–99)

## 2024-09-04 MED ORDER — TRAMADOL HCL 50 MG PO TABS
50.0000 mg | ORAL_TABLET | Freq: Four times a day (QID) | ORAL | Status: DC | PRN
  Administered 2024-09-04 (×2): 50 mg via ORAL
  Filled 2024-09-04 (×2): qty 1

## 2024-09-04 MED ORDER — DICYCLOMINE HCL 10 MG PO CAPS
10.0000 mg | ORAL_CAPSULE | Freq: Three times a day (TID) | ORAL | Status: DC
  Administered 2024-09-04 – 2024-09-06 (×6): 10 mg via ORAL
  Filled 2024-09-04 (×8): qty 1

## 2024-09-04 MED ORDER — MORPHINE SULFATE (PF) 4 MG/ML IV SOLN
4.0000 mg | INTRAVENOUS | Status: DC | PRN
  Administered 2024-09-04 – 2024-09-06 (×5): 4 mg via INTRAVENOUS
  Filled 2024-09-04 (×5): qty 1

## 2024-09-04 MED ORDER — HYDROCODONE-ACETAMINOPHEN 10-325 MG PO TABS
1.0000 | ORAL_TABLET | Freq: Four times a day (QID) | ORAL | Status: DC | PRN
  Administered 2024-09-04 – 2024-09-05 (×4): 1 via ORAL
  Filled 2024-09-04 (×5): qty 1

## 2024-09-04 MED ORDER — IPRATROPIUM-ALBUTEROL 0.5-2.5 (3) MG/3ML IN SOLN
3.0000 mL | Freq: Four times a day (QID) | RESPIRATORY_TRACT | Status: DC | PRN
  Administered 2024-09-05 – 2024-09-06 (×2): 3 mL via RESPIRATORY_TRACT
  Filled 2024-09-04 (×2): qty 3

## 2024-09-04 NOTE — Progress Notes (Signed)
 "   Subjective: Still with some nausea.  Did have 1 episode of emesis early this morning.  Feels about the same after having paracentesis yesterday.  Did have a small bowel movement.  He is passing gas.  Complains of left upper quadrant crampy stomach pain.  Objective: Vital signs in last 24 hours: Temp:  [97.8 F (36.6 C)-98 F (36.7 C)] 98 F (36.7 C) (12/25 0556) Pulse Rate:  [93-107] 93 (12/25 0556) Resp:  [22] 22 (12/24 1140) BP: (99-147)/(59-93) 134/85 (12/25 0556) SpO2:  [94 %-98 %] 94 % (12/25 0556) Weight:  [887 kg] 112 kg (12/25 0428) Last BM Date : 09/02/24  Intake/Output from previous day: 12/24 0701 - 12/25 0700 In: 720 [P.O.:720] Out: 50 [Emesis/NG output:50] Intake/Output this shift: No intake/output data recorded.  General appearance: alert, cooperative, and no distress GI: Soft, mildly distended.  No rigidity noted.  No point tenderness noted.  Lab Results:  Recent Labs    09/02/24 0412 09/03/24 0412  WBC 6.3 7.7  HGB 10.9* 10.3*  HCT 34.5* 32.8*  PLT 245 236   BMET Recent Labs    09/02/24 0412 09/03/24 0412  NA 137 134*  K 3.4* 4.1  CL 98 96*  CO2 23 30  GLUCOSE 99 100*  BUN 9 10  CREATININE 1.01 1.23  CALCIUM  8.7* 8.6*   PT/INR No results for input(s): LABPROT, INR in the last 72 hours.  Studies/Results: US  Paracentesis Result Date: 09/03/2024 INDICATION: Ascites EXAM: ULTRASOUND GUIDED therapeutic and diagnostic PARACENTESIS MEDICATIONS: None. COMPLICATIONS: None immediate. PROCEDURE: Informed written consent was obtained from the patient after a discussion of the risks, benefits and alternatives to treatment. A timeout was performed prior to the initiation of the procedure. Initial ultrasound scanning demonstrates a moderate amount of ascites within the right upper abdominal quadrant. The right lower abdomen was prepped and draped in the usual sterile fashion. 1% lidocaine  was used for local anesthesia. Following this, a Safe-T-Centesis  catheter was introduced. An ultrasound image was saved for documentation purposes. The paracentesis was performed. The catheter was removed and a dressing was applied. The patient tolerated the procedure well without immediate post procedural complication. FINDINGS: A total of approximately 1.6 L of serous fluid was removed. Samples were sent to the laboratory as requested by the clinical team. IMPRESSION: Successful ultrasound-guided paracentesis yielding 1.6 liters of peritoneal fluid. Electronically Signed   By: Lynwood Landy Raddle M.D.   On: 09/03/2024 12:46   CT ABDOMEN PELVIS W CONTRAST Result Date: 09/03/2024 CLINICAL DATA:  Abdominal pain and distension. Bowel obstruction suspected. History of metastatic cholangiocarcinoma. * Tracking Code: BO * EXAM: CT ABDOMEN AND PELVIS WITH CONTRAST TECHNIQUE: Multidetector CT imaging of the abdomen and pelvis was performed using the standard protocol following bolus administration of intravenous contrast. RADIATION DOSE REDUCTION: This exam was performed according to the departmental dose-optimization program which includes automated exposure control, adjustment of the mA and/or kV according to patient size and/or use of iterative reconstruction technique. CONTRAST:  OMNIPAQUE  IOHEXOL  300 MG/ML  SOLN COMPARISON:  Abdomen and pelvis CTA 08/16/2024. FINDINGS: Lower chest: Moderate left pleural effusion with left base collapse/consolidation, similar to 08/16/2024. Hepatobiliary: 13 mm low-density lesion in the dome of the liver is stable in the interval. Additional low-density lesions in the left and right hepatic lobes measure up to 2.2 cm also stable since immediate prior study and remain compatible with metastatic disease. Gallbladder is distended with intraluminal debris and calcified stone. No intrahepatic or extrahepatic biliary dilation. Pancreas: No  focal mass lesion. No dilatation of the main duct. No intraparenchymal cyst. No peripancreatic edema. Spleen:  No splenomegaly. No suspicious focal mass lesion. Adrenals/Urinary Tract: No adrenal nodule or mass. Tiny well-defined homogeneous low-density lesions in both kidneys are too small to characterize but are statistically most likely benign and probably cysts. No followup imaging is recommended. No evidence for hydroureter. Bladder is obscured by beam hardening artifact from bilateral hip replacement. Stomach/Bowel: Stomach is moderately distended, similar to prior. Diffuse small bowel dilatation is stable to mildly progressive in the interval. Small bowel loops measure up to 5.3 cm diameter on the current study in the left upper quadrant. Small bowel pneumatosis seen previously has resolved. A discrete transition zone is not identified but small bowel in the right lower quadrant is completely decompressed. Colon is stool filled but nondilated. Vascular/Lymphatic: There is moderate atherosclerotic calcification of the abdominal aorta without aneurysm. There is no gastrohepatic or hepatoduodenal ligament lymphadenopathy. No retroperitoneal or mesenteric lymphadenopathy. No pelvic sidewall lymphadenopathy. Reproductive: Prostate gland is obscured by beam hardening artifact. Other: Small to moderate volume ascites appears loculated in shows areas of apparent peritoneal enhancement, features compatible with carcinomatosis. There is nodularity in the omentum and mesentery suspicious for metastatic disease. Musculoskeletal: Bilateral hip replacement. No worrisome lytic or sclerotic osseous abnormality. IMPRESSION: 1. Diffuse small bowel dilatation is stable to mildly progressive in the interval. A discrete transition zone is not identified but small bowel in the right lower quadrant is completely decompressed. Imaging features are compatible with small bowel obstruction. 2. Small to moderate volume ascites appears loculated and shows areas of apparent peritoneal enhancement, features compatible with carcinomatosis. There is  nodularity in the omentum and mesentery suspicious for metastatic disease. 3. Moderate left pleural effusion with left base collapse/consolidation, similar to 08/16/2024. 4. Stable low-density lesions in the liver compatible with metastatic disease. 5. Cholelithiasis. 6.  Aortic Atherosclerosis (ICD10-I70.0). Electronically Signed   By: Camellia Candle M.D.   On: 09/03/2024 10:50   DG Abd 1 View Result Date: 09/02/2024 CLINICAL DATA:  Abdominal discomfort.  Abdominal distension.  Pain. EXAM: ABDOMEN - 1 VIEW COMPARISON:  Radiograph 08/19/2024 FINDINGS: Dilated air-filled loops of bowel in the left mid abdomen, may represent redundant colon or small bowel. Moderate formed stool in the right and transverse colon. No evidence of free air on the supine views. Multiple pelvic phleboliths. IMPRESSION: Dilated air-filled loops of bowel in the left mid abdomen, may represent redundant colon or small bowel. Consider further evaluation with CT. Electronically Signed   By: Andrea Gasman M.D.   On: 09/02/2024 15:21    Anti-infectives: Anti-infectives (From admission, onward)    Start     Dose/Rate Route Frequency Ordered Stop   08/29/24 2000  azithromycin  (ZITHROMAX ) 500 mg in sodium chloride  0.9 % 250 mL IVPB        500 mg 250 mL/hr over 60 Minutes Intravenous Every 24 hours 08/28/24 1957 09/01/24 2101   08/29/24 1300  cefTRIAXone  (ROCEPHIN ) 2 g in sodium chloride  0.9 % 100 mL IVPB        2 g 200 mL/hr over 30 Minutes Intravenous Every 24 hours 08/28/24 1957 09/01/24 1515   08/28/24 2200  vancomycin  (VANCOREADY) IVPB 500 mg/100 mL        500 mg 100 mL/hr over 60 Minutes Intravenous  Once 08/28/24 2011 08/29/24 0728   08/28/24 2030  vancomycin  (VANCOREADY) IVPB 2000 mg/400 mL  Status:  Discontinued        2,000 mg 200 mL/hr over  120 Minutes Intravenous Every 24 hours 08/28/24 2006 08/29/24 1110   08/28/24 2000  oseltamivir  (TAMIFLU ) capsule 75 mg        75 mg Oral 2 times daily 08/28/24 1953 09/02/24  0909   08/28/24 1900  azithromycin  (ZITHROMAX ) 500 mg in sodium chloride  0.9 % 250 mL IVPB        500 mg 250 mL/hr over 60 Minutes Intravenous  Once 08/28/24 1852 08/28/24 2023   08/28/24 1300  cefTRIAXone  (ROCEPHIN ) 2 g in sodium chloride  0.9 % 100 mL IVPB        2 g 200 mL/hr over 30 Minutes Intravenous  Once 08/28/24 1254 08/28/24 1416       Assessment/Plan: Impression: Nausea and vomiting multifactorial in nature.  Paracentesis has not resolved this.  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  Patient is not a surgical candidate.  Patient is agreeable to a gastrostomy tube vent.  Have consulted IR for evaluation and possible placement.  Will add Bentyl  to his regimen.  Tramadol  is not controlling his pain and I have added Vicodin.  LOS: 7 days    Oneil Budge 09/04/2024  "

## 2024-09-04 NOTE — Plan of Care (Signed)
   Problem: Education: Goal: Knowledge of General Education information will improve Description: Including pain rating scale, medication(s)/side effects and non-pharmacologic comfort measures Outcome: Progressing   Problem: Nutrition: Goal: Adequate nutrition will be maintained Outcome: Progressing   Problem: Coping: Goal: Level of anxiety will decrease Outcome: Progressing

## 2024-09-04 NOTE — Progress Notes (Signed)
 Nurse at bedside, complaint of pain in abdomen and back tramadol  has been given to relieve the pain will follow up with patient to ensure it was effective. No other questions or concerns were brought to me at this time.

## 2024-09-04 NOTE — Plan of Care (Signed)
   Problem: Activity: Goal: Risk for activity intolerance will decrease Outcome: Progressing   Problem: Coping: Goal: Level of anxiety will decrease Outcome: Progressing

## 2024-09-04 NOTE — Progress Notes (Signed)
 "          PROGRESS NOTE  Marvin Carr FMW:[id number] DOB: May 03, 1956 DOA: 08/28/2024 PCP: [name], MD  Brief History:  68 y.o. 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 -He presented to ED secondary to dyspnea, lysed weakness, fatigue, he denies chest pain, reports orthopnea, cough, nonproductive, fever and chills at home, he has diarrhea which is chronic, forts weakness during ambulation yesterday where he felt his legs gave up or he had a fall, had oncology follow-up appointment today where he was noted to be febrile 100.3, tachypneic and tachycardic so he was brought to ED for further evaluation. -In ED workup significant for [place] AF pneumonia, CTA chest negative for PE but significant for multifocal pneumonia, labs were significant for Creatinine, elevated lactic acid at 4.1, improved with IV fluids, his respiratory panel positive for influenza A, negative UA, [facility] hospitalist consulted to admit.   Assessment/Plan:  severe sepsis -- RESOLVED  - Fever, tachycardia, dyspnea, influenza A, hypotension given concern for severe sepsis.  Blood pressure responding well to IV fluid bolus.  No need for vasopressor medications at this time.  Blood cultures NGTD, - sepsis physiology resolved   Acute influenza A -- IMPROVING  - Rapid screen positive for influenza A.  -MRSA PCR negative and minimal leukocytosis, discontinued vancomycin .  Strep pneumo negative.    -He is completing a 5 day course of tamiflu . -stable on RA   Acute hypoxic respiratory failure -- RESOLVED  - Secondary to influenza A.   -Weaned to room air.   -- he continues to complain of severe cough and DOE  -He says he was experiencing this at least 1 month prior to influenza infection.    -CT angio chest that was neg for PE.  CXR 12/22 suggesting mild pulmonary edema, r -started IV lasix >>hold now,  -added bronchodilators and added scheduled cough syrup and incentive spirometry, encouraged ambulation,  -he completed 5 days of ceftriaxone  and azithromycin .    Partial SBO -12/23--pt had increase abd pain and distension -12/24 CT AP--Diffuse small bowel dilatation is stable to mildly progressive.  discrete transition zone is not identified but small bowel in the right lower quadrant is completely decompressed. Imaging features are compatible with small bowel obstruction -12/24--consulted general surgery -12/25--discussed with Dr. Prudy IR consult for G-tube to vent;  pt is not a surgical candidate   Positive blood culture -- 1/4 blood culture reported positive for Gram pos rods in anaerobic bottle -- waiting on ID -- he was treated with IV ceftriaxone  2 gm daily x 5 doses  - likely contaminant   Metastatic cholangiocarcinoma - Noted peritoneal carcinomatosis and metastatic disease to the liver and bile duct.  Follows with oncology in the outpatient setting.  -last FOLFOX 07/29/24 -pt wants to follow up with Dr. [name]   CAD/HLD - S/p LCx stent 10/25.  Continue Brilinta .  Aspirin  discontinued during recent hospitalization per cardiology recommendations.     Abdominal distension -- he is still having flatus  -- we worry about recurrent  SBO which he was just discharged from [facility] for -order CT abd/pelvis -- ordered a KUB--non diagnostic   Acute on chronic HFpEF/HTN -Appear to be exacerbated and responding to IV furosemide  40 mg daily>>now stopped due to PSBO -- added  daily weights, strict I/Os, fluid restriction -- potassium supplement ordered -hold further lasix  for now as he appears euvolemic now -- hold nifedipine  and monitor BP   Diabetes mellitus, type 2  - Insulin  sliding scale ordered. -07/03/24 A1C--5.7   BPH - Flomax  support.   Anxiety/depression - Resumed Wellbutrin  300 mg daily.                 Family Communication:   no Family at bedside   Consultants:  general surgery   Code Status:  DNR   DVT Prophylaxis:  [place] Heparin       Procedures: As Listed in Progress Note Above   Antibiotics: Ceftriaxone zenovia 12/18>>12/22            Subjective: Pt complains of abd pain.  Has nausea, no emesis.  Denies f/c, cp, sob, diarrhea.  Objective: Vitals:   09/03/24 1951 09/04/24 0428 09/04/24 0556 09/04/24 1341  BP: (!) 147/86 (!) 141/92 134/85 126/82  Pulse: 97 (!) 107 93 88  Resp:    20  Temp: 98 F (36.7 C) 97.8 F (36.6 C) 98 F (36.7 C) 98.2 F (36.8 C)  TempSrc: Oral Oral Axillary Oral  SpO2: 95% 94% 94% 97%  Weight:  112 kg    Height:        Intake/Output Summary (Last 24 hours) at 09/04/2024 1410 Last data filed at 09/04/2024 1300 Gross per 24 hour  Intake 1080 ml  Output 50 ml  Net 1030 ml   Weight change: -1.7 kg Exam:  General:  Pt is alert, follows commands appropriately, not in acute distress HEENT: No icterus, No thrush, No neck mass, [place]/AT Cardiovascular: RRR, S1/S2, no rubs, no gallops Respiratory: CTA bilaterally, no wheezing, no crackles, no rhonchi Abdomen: Soft/+BS, diffuse tender, mod distended, no guarding Extremities: No edema, No lymphangitis, No petechiae, No rashes, no synovitis   Data Reviewed: I have personally reviewed following labs and imaging studies Basic Metabolic Panel: Recent Labs  Lab 08/29/24 0459 08/30/24 0250 08/31/24 0423 09/02/24 0412 09/03/24 0412  NA 135 135 138 137 134*  K 3.5 3.2* 3.9 3.4* 4.1  CL 101 103 102 98 96*  CO2 25 25 27 23 30    GLUCOSE 89 103* 87 99 100*  BUN 11 10 9 9 10   CREATININE 1.26* 1.07 1.12 1.01 1.23  CALCIUM  7.8* 7.6* 8.0* 8.7* 8.6*   Liver Function Tests: No results for input(s): AST, ALT, ALKPHOS, BILITOT, PROT, ALBUMIN  in the last 168 hours. No results for input(s): LIPASE, AMYLASE in the last 168 hours. No results for input(s): AMMONIA in the last 168 hours. Coagulation Profile: No results for input(s): INR, PROTIME in the last 168 hours. CBC: Recent Labs  Lab 08/29/24 0459 08/30/24 0250 08/31/24 0423 09/02/24 0412 09/03/24 0412  WBC 5.4 4.2 5.5 6.3 7.7  HGB 8.6* 8.2* 9.5* 10.9* 10.3*  HCT 27.3* 26.6* 30.5* 34.5* 32.8*  MCV 98.9 99.6 99.0 96.4 97.3  PLT 213 196 212 245 236   Cardiac Enzymes: No results for input(s): CKTOTAL, CKMB, CKMBINDEX, TROPONINI in the last 168 hours. BNP: Invalid input(s): POCBNP CBG: Recent Labs  Lab 09/03/24 1113 09/03/24 1613 09/03/24 1948 09/04/24 0712 09/04/24 1121  GLUCAP 114* 113* 110* 105* 124*   HbA1C: No results for input(s):  HGBA1C in the last 72 hours. Urine analysis:    Component Value Date/Time   COLORURINE YELLOW 08/28/2024 1805   APPEARANCEUR CLEAR 08/28/2024 1805   LABSPEC 1.021 08/28/2024 1805   PHURINE 5.0 08/28/2024 1805   GLUCOSEU NEGATIVE 08/28/2024 1805   HGBUR NEGATIVE 08/28/2024 1805   BILIRUBINUR NEGATIVE 08/28/2024 1805   KETONESUR NEGATIVE 08/28/2024 1805   PROTEINUR NEGATIVE 08/28/2024 1805   NITRITE NEGATIVE 08/28/2024 1805   LEUKOCYTESUR NEGATIVE 08/28/2024 1805   Sepsis Labs: @LABRCNTIP (procalcitonin:4,lacticidven:4) ) Recent Results (from the past 240 hours)  Resp panel by RT-PCR (RSV, Flu A&B, Covid) Anterior Nasal Swab     Status: Abnormal   Collection Time: 08/28/24  1:21 PM   Specimen: Anterior Nasal Swab  Result Value Ref Range Status   SARS Coronavirus 2 by RT PCR NEGATIVE NEGATIVE Final    Comment: (NOTE) SARS-CoV-2 target nucleic acids are NOT DETECTED.  The  SARS-CoV-2 RNA is generally detectable in upper respiratory specimens during the acute phase of infection. The lowest concentration of SARS-CoV-2 viral copies this assay can detect is 138 copies/mL. A negative result does not preclude SARS-Cov-2 infection and should not be used as the sole basis for treatment or other patient management decisions. A negative result may occur with  improper specimen collection/handling, submission of specimen other than nasopharyngeal swab, presence of viral mutation(s) within the areas targeted by this assay, and inadequate number of viral copies(<138 copies/mL). A negative result must be combined with clinical observations, patient history, and epidemiological information. The expected result is Negative.  Fact Sheet for Patients:  bloggercourse.com  Fact Sheet for Healthcare Providers:  seriousbroker.it  This test is no t yet approved or cleared by the [place]  FDA and  has been authorized for detection and/or diagnosis of SARS-CoV-2 by FDA under an Emergency Use Authorization (EUA). This EUA will remain  in effect (meaning this test can be used) for the duration of the COVID-19 declaration under Section 564(b)(1) of the Act, 21 U.S.C.section 360bbb-3(b)(1), unless the authorization is terminated  or revoked sooner.       Influenza A by PCR POSITIVE (A) NEGATIVE Final   Influenza B by PCR NEGATIVE NEGATIVE Final    Comment: (NOTE) The Xpert Xpress SARS-CoV-2/FLU/RSV plus assay is intended as an aid in the diagnosis of influenza from Nasopharyngeal swab specimens and should not be used as a sole basis for treatment. Nasal washings and aspirates are unacceptable for Xpert Xpress SARS-CoV-2/FLU/RSV testing.  Fact Sheet for Patients: bloggercourse.com  Fact Sheet for Healthcare Providers: seriousbroker.it  This test is not yet approved or  cleared by the [place]  FDA and has been authorized for detection and/or diagnosis of SARS-CoV-2 by FDA under an Emergency Use Authorization (EUA). This EUA will remain in effect (meaning this test can be used) for the duration of the COVID-19 declaration under Section 564(b)(1) of the Act, 21 U.S.C. section 360bbb-3(b)(1), unless the authorization is terminated or revoked.     Resp Syncytial Virus by PCR NEGATIVE NEGATIVE Final    Comment: (NOTE) Fact Sheet for Patients: bloggercourse.com  Fact Sheet for Healthcare Providers: seriousbroker.it  This test is not yet approved or cleared by the [place]  FDA and has been authorized for detection and/or diagnosis of SARS-CoV-2 by FDA under an Emergency Use Authorization (EUA). This EUA will remain in effect (meaning this test can be used) for the duration of the COVID-19 declaration under Section 564(b)(1) of the Act, 21 U.S.C. section 360bbb-3(b)(1), unless the authorization is terminated or revoked.  Performed at [facility], [address], [place], [place] [address]   C Difficile Quick Screen w PCR reflex     Status: None   Collection Time: 08/28/24  1:22 PM   Specimen: STOOL  Result Value Ref Range Status   C Diff antigen NEGATIVE NEGATIVE Final   C Diff toxin NEGATIVE NEGATIVE Final   C Diff interpretation No C. difficile detected.  Final    Comment: Performed at [facility], [address], [place], [place] [address]  Culture, blood (Routine x 2)     Status: Abnormal   Collection Time: 08/28/24  1:34 PM   Specimen: BLOOD RIGHT ARM  Result Value Ref Range Status   Specimen Description   Final    BLOOD RIGHT ARM Performed at [facility], [address], [place], [place] [address]    Special Requests   Final    BOTTLES DRAWN AEROBIC AND ANAEROBIC Blood Culture adequate volume Performed at [facility], [address], [place], [place] [address]    Culture   Setup Time   Final    GRAM POSITIVE RODS ANAEROBIC BOTTLE ONLY Gram Stain Report Called to,Read Back By and Verified With: B BROOKS,RN@0300  09/02/24 MK GS REVA GPR BY DD 09/02/2024 @ 0502 Performed at [facility], [address] [place], [place] [address]    Culture CUTIBACTERIUM ACNES (A)  Final   Report Status 09/04/2024 FINAL  Final  Culture, blood (Routine x 2)     Status: None   Collection Time: 08/28/24  1:39 PM   Specimen: BLOOD  Result Value Ref Range Status   Specimen Description BLOOD BLOOD RIGHT ARM  Final   Special Requests   Final    BOTTLES DRAWN AEROBIC AND ANAEROBIC Blood Culture adequate volume   Culture   Final    NO GROWTH 5 DAYS Performed at [facility], [address] [place], [place] [address]    Report Status 09/02/2024 FINAL  Final  MRSA Next Gen by PCR, Nasal     Status: None   Collection Time: 08/28/24 10:28 PM  Result Value Ref Range Status   MRSA by PCR Next Gen NOT DETECTED NOT DETECTED Final    Comment: (NOTE) The GeneXpert MRSA Assay (FDA approved for NASAL specimens only), is one component of a comprehensive MRSA colonization surveillance program. It is not intended to diagnose MRSA infection nor to guide or monitor treatment for MRSA infections. Test performance is not FDA approved in patients less than 34 years old. Performed at [facility], [address] [place], [place] [address]   Gram stain     Status: None   Collection Time: 09/03/24 11:40 AM   Specimen: Ascitic  Result Value Ref Range Status   Specimen Description ASCITIC  Final   Special Requests ASCITIC  Final   Gram Stain   Final    NO WBC SEEN NO ORGANISMS SEEN CYTOSPIN SMEAR Performed at [facility], [address], [place], [place] [address]    Report Status 09/03/2024 FINAL  Final  Culture, body fluid w Gram Stain-bottle     Status: None (Preliminary result)   Collection Time: 09/03/24 11:40 AM   Specimen: Ascitic  Result Value Ref Range Status   Specimen  Description ASCITIC  Final   Special Requests   Final    BOTTLES DRAWN AEROBIC AND ANAEROBIC Blood Culture adequate volume   Culture   Final    NO GROWTH < 24 HOURS Performed at [facility],  [address], [place], [place] [address]    Report Status PENDING  Incomplete     Scheduled Meds:  atorvastatin   20 mg Oral q1800   buPROPion   300 mg Oral Daily   dextromethorphan   30 mg Oral BID   dicyclomine   10 mg Oral TID AC & HS   docusate sodium   200 mg Oral BID   famotidine   20 mg Oral QHS   fenofibrate   160 mg Oral Daily   heparin   5,000 Units Subcutaneous Q8H   ipratropium-albuterol   3 mL Nebulization BID   midodrine   5 mg Oral TID WC   pantoprazole   40 mg Oral Daily   polyethylene glycol  17 g Oral BID   pramipexole   0.75 mg Oral TID   tamsulosin   0.4 mg Oral Daily   ticagrelor   90 mg Oral BID   zolpidem   5 mg Oral QHS   Continuous Infusions:  Procedures/Studies: US  Paracentesis Result Date: 09/03/2024 INDICATION: Ascites EXAM: ULTRASOUND GUIDED therapeutic and diagnostic PARACENTESIS MEDICATIONS: None. COMPLICATIONS: None immediate. PROCEDURE: Informed written consent was obtained from the patient after a discussion of the risks, benefits and alternatives to treatment. A timeout was performed prior to the initiation of the procedure. Initial ultrasound scanning demonstrates a moderate amount of ascites within the right upper abdominal quadrant. The right lower abdomen was prepped and draped in the usual sterile fashion. 1% lidocaine  was used for local anesthesia. Following this, a Safe-T-Centesis catheter was introduced. An ultrasound image was saved for documentation purposes. The paracentesis was performed. The catheter was removed and a dressing was applied. The patient tolerated the procedure well without immediate post procedural complication. FINDINGS: A total of approximately 1.6 L of serous fluid was removed. Samples were sent to the laboratory as requested by the clinical  team. IMPRESSION: Successful ultrasound-guided paracentesis yielding 1.6 liters of peritoneal fluid. Electronically Signed   By: Lynwood Landy Raddle M.D.   On: 09/03/2024 12:46   CT ABDOMEN PELVIS W CONTRAST Result Date: 09/03/2024 CLINICAL DATA:  Abdominal pain and distension. Bowel obstruction suspected. History of metastatic cholangiocarcinoma. * Tracking Code: BO * EXAM: CT ABDOMEN AND PELVIS WITH CONTRAST TECHNIQUE: Multidetector CT imaging of the abdomen and pelvis was performed using the standard protocol following bolus administration of intravenous contrast. RADIATION DOSE REDUCTION: This exam was performed according to the departmental dose-optimization program which includes automated exposure control, adjustment of the mA and/or kV according to patient size and/or use of iterative reconstruction technique. CONTRAST:  OMNIPAQUE  IOHEXOL  300 MG/ML  SOLN COMPARISON:  Abdomen and pelvis CTA 08/16/2024. FINDINGS: Lower chest: Moderate left pleural effusion with left base collapse/consolidation, similar to 08/16/2024. Hepatobiliary: 13 mm low-density lesion in the dome of the liver is stable in the interval. Additional low-density lesions in the left and right hepatic lobes measure up to 2.2 cm also stable since immediate prior study and remain compatible with metastatic disease. Gallbladder is distended with intraluminal debris and calcified stone. No intrahepatic or extrahepatic biliary dilation. Pancreas: No focal mass lesion. No dilatation of the main duct. No intraparenchymal cyst. No peripancreatic edema. Spleen: No splenomegaly. No suspicious focal mass lesion. Adrenals/Urinary Tract: No adrenal nodule or mass. Tiny well-defined homogeneous low-density lesions in both kidneys are too small to characterize but are statistically most likely benign and probably cysts. No followup imaging is recommended. No evidence for hydroureter. Bladder is obscured by beam hardening artifact from bilateral hip  replacement. Stomach/Bowel: Stomach is moderately distended, similar to prior. Diffuse small bowel dilatation is  stable to mildly progressive in the interval. Small bowel loops measure up to 5.3 cm diameter on the current study in the left upper quadrant. Small bowel pneumatosis seen previously has resolved. A discrete transition zone is not identified but small bowel in the right lower quadrant is completely decompressed. Colon is stool filled but nondilated. Vascular/Lymphatic: There is moderate atherosclerotic calcification of the abdominal aorta without aneurysm. There is no gastrohepatic or hepatoduodenal ligament lymphadenopathy. No retroperitoneal or mesenteric lymphadenopathy. No pelvic sidewall lymphadenopathy. Reproductive: Prostate gland is obscured by beam hardening artifact. Other: Small to moderate volume ascites appears loculated in shows areas of apparent peritoneal enhancement, features compatible with carcinomatosis. There is nodularity in the omentum and mesentery suspicious for metastatic disease. Musculoskeletal: Bilateral hip replacement. No worrisome lytic or sclerotic osseous abnormality. IMPRESSION: 1. Diffuse small bowel dilatation is stable to mildly progressive in the interval. A discrete transition zone is not identified but small bowel in the right lower quadrant is completely decompressed. Imaging features are compatible with small bowel obstruction. 2. Small to moderate volume ascites appears loculated and shows areas of apparent peritoneal enhancement, features compatible with carcinomatosis. There is nodularity in the omentum and mesentery suspicious for metastatic disease. 3. Moderate left pleural effusion with left base collapse/consolidation, similar to 08/16/2024. 4. Stable low-density lesions in the liver compatible with metastatic disease. 5. Cholelithiasis. 6.  Aortic Atherosclerosis (ICD10-I70.0). Electronically Signed   By: Camellia Candle M.D.   On: 09/03/2024 10:50   DG  Abd 1 View Result Date: 09/02/2024 CLINICAL DATA:  Abdominal discomfort.  Abdominal distension.  Pain. EXAM: ABDOMEN - 1 VIEW COMPARISON:  Radiograph 08/19/2024 FINDINGS: Dilated air-filled loops of bowel in the left mid abdomen, may represent redundant colon or small bowel. Moderate formed stool in the right and transverse colon. No evidence of free air on the supine views. Multiple pelvic phleboliths. IMPRESSION: Dilated air-filled loops of bowel in the left mid abdomen, may represent redundant colon or small bowel. Consider further evaluation with CT. Electronically Signed   By: Andrea Gasman M.D.   On: 09/02/2024 15:21   DG CHEST PORT 1 VIEW Result Date: 09/01/2024 CLINICAL DATA:  Dyspnea. Influenza A. Shortness of breath. Chest tightness. EXAM: PORTABLE CHEST 1 VIEW COMPARISON:  08/29/2019 radiograph and CT FINDINGS: Right chest port remains in place. Stable heart size and mediastinal contours. Diffuse interstitial coarsening, with slight progression from prior exam. Left pleural effusion and basilar opacity are similar. No pneumothorax. IMPRESSION: 1. Diffuse interstitial coarsening, with slight progression from prior exam, may represent atypical infection or pulmonary edema. 2. Left pleural effusion and basilar opacity are similar. Electronically Signed   By: Andrea Gasman M.D.   On: 09/01/2024 16:47   CT Angio Chest PE W and/or Wo Contrast Result Date: 08/28/2024 CLINICAL DATA:  Concern for pulmonary embolism. EXAM: CT ANGIOGRAPHY CHEST WITH CONTRAST TECHNIQUE: Multidetector CT imaging of the chest was performed using the standard protocol during bolus administration of intravenous contrast. Multiplanar CT image reconstructions and MIPs were obtained to evaluate the vascular anatomy. RADIATION DOSE REDUCTION: This exam was performed according to the departmental dose-optimization program which includes automated exposure control, adjustment of the mA and/or kV according to patient size and/or  use of iterative reconstruction technique. CONTRAST:  75mL OMNIPAQUE  IOHEXOL  350 MG/ML SOLN COMPARISON:  Chest radiograph dated 08/28/2024. FINDINGS: Cardiovascular: There is no cardiomegaly or pericardial effusion. There is 3 vessel coronary vascular calcification. Mild atherosclerotic calcification of the thoracic aorta. No aneurysmal dilatation or dissection. The origins of the  great vessels of the aortic arch appear patent. Right-sided Port-A-Cath with tip over central SVC. No pulmonary artery embolus identified. Mediastinum/Nodes: No hilar or mediastinal adenopathy. The esophagus is grossly unremarkable no mediastinal fluid collection. Lungs/Pleura: Small left pleural effusion with partial compressive atelectasis of the left lower lobe. Patchy area of streaky and nodular density in the left upper lobe and lingula consistent with pneumonia. Aspiration is not excluded. Additional reticulonodular density at the right lung base may represent atelectasis or infiltrate. No pneumothorax. The central airways are patent. Upper Abdomen: Partially visualized ascites Musculoskeletal: Degenerative changes of the spine. No acute osseous pathology. Review of the MIP images confirms the above findings. IMPRESSION: 1. No CT evidence of pulmonary artery embolus. 2. Left upper lobe and lingular pneumonia. Aspiration is not excluded. 3. Small left pleural effusion with partial compressive atelectasis of the left lower lobe. 4. Partially visualized ascites. 5.  Aortic Atherosclerosis (ICD10-I70.0). Electronically Signed   By: Vanetta Chou M.D.   On: 08/28/2024 18:17   DG Chest Port 1 View Result Date: 08/28/2024 CLINICAL DATA:  Questionable sepsis - evaluate for abnormality EXAM: PORTABLE CHEST - 1 VIEW COMPARISON:  07/16/2024 FINDINGS: Right chest port is similarly positioned terminating in the lower SVC. Unchanged trace left pleural effusion. Streaky left basilar atelectasis. No pneumothorax or new airspace consolidation.  The cardiac silhouette is at the upper limits of normal, likely accentuated by AP technique. Aortic atherosclerosis. No acute fracture or destructive lesions. Multilevel thoracic osteophytosis. IMPRESSION: Unchanged trace left pleural effusion with streaky left basilar atelectasis. Electronically Signed   By: Rogelia Myers M.D.   On: 08/28/2024 13:35   DG Abd Portable 1V Result Date: 08/19/2024 CLINICAL DATA:  Bowel obstruction. EXAM: PORTABLE ABDOMEN - 1 VIEW COMPARISON:  08/18/2024 FINDINGS: NG tube tip is in the proximal stomach. Although side port of the NG tube is not clearly visualized, it is likely in the distal esophagus. Gaseous distention of transverse colon noted without frank dilatation. Gas is visible in the nondistended rectum. Air is seen scattered through nondilated small bowel loops. IMPRESSION: 1. NG tube tip is in the proximal stomach. Although side port of the NG tube is not clearly visualized, it is likely in the distal esophagus. 2. Nonspecific bowel gas pattern. Electronically Signed   By: Camellia Candle M.D.   On: 08/19/2024 07:21   DG Abd 1 View Result Date: 08/18/2024 CLINICAL DATA:  Enteric catheter placement EXAM: ABDOMEN - 1 VIEW COMPARISON:  08/17/2024 FINDINGS: Frontal view of the lower chest and upper abdomen demonstrates stable position of the enteric catheter, tip projecting over the gastric fundus. Side port projects proximally 2.5 cm above the left hemidiaphragm. Bowel gas pattern is unremarkable. Stable left pleural effusion and left lower lobe consolidation. IMPRESSION: 1. Enteric catheter tip projecting over the gastric fundus, with side port projecting 2.5 cm above the left hemidiaphragm. 2. Stable left pleural effusion and left basilar consolidation. Electronically Signed   By: Ozell Daring M.D.   On: 08/18/2024 17:10   DG Abd 1 View Result Date: 08/17/2024 EXAM: 1 VIEW XRAY OF THE ABDOMEN 08/17/2024 12:28:00 AM COMPARISON: None available. CLINICAL HISTORY:  Encounter for nasogastric (NG) tube placement. FINDINGS: LINES, TUBES AND DEVICES: Enteric tube courses below the hemidiaphragm with the tip overlying the gastric lumen and side port not well visualized, likely in the region of the gastroesophageal junction. BOWEL: Nonobstructive bowel gas pattern. SOFT TISSUES: No abnormal calcifications. BONES: No acute fracture. LIMITATIONS: Right flank and pelvis collimated off view. IMPRESSION: 1.  Enteric tube tip overlies the gastric lumen; side port is not well visualized and is likely at the gastroesophageal junction, consider advancing the tube slightly to ensure the side port is intragastric. Electronically signed by: Morgane Naveau MD 08/17/2024 12:33 AM EST RP Workstation: [id number]   CT ANGIO GI BLEED Result Date: 08/16/2024 EXAM: CTA ABDOMEN AND PELVIS WITH CONTRAST 08/16/2024 03:23:46 PM TECHNIQUE: CTA images of the abdomen and pelvis with intravenous contrast. 100 mL iohexol  (OMNIPAQUE ) 350 MG/ML injection. Three-dimensional MIP/volume rendered formations were performed. Automated exposure control, iterative reconstruction, and/or weight based adjustment of the mA/kV was utilized to reduce the radiation dose to as low as reasonably achievable. COMPARISON: 05/15/2024 and 06/03/2024. CLINICAL HISTORY: Suspected GI bleed. FINDINGS: VASCULATURE: GI BLEED: No active extravasation of contrast within the GI tract. AORTA: No acute finding. No abdominal aortic aneurysm. No dissection. CELIAC TRUNK: No acute finding. No occlusion or significant stenosis. SUPERIOR MESENTERIC ARTERY: No acute finding. No occlusion or significant stenosis. INFERIOR MESENTERIC ARTERY: No acute finding. No occlusion or significant stenosis. RENAL ARTERIES: No acute finding. No occlusion or significant stenosis. ILIAC ARTERIES: No acute finding. No occlusion or significant stenosis. ABDOMEN/PELVIS: LOWER CHEST: Visualized portion of the lower chest demonstrates no acute abnormality. LIVER:  Hepatic low density is again noted consistent with metastatic disease. GALLBLADDER AND BILE DUCTS: Cholelithiasis. No biliary ductal dilatation. SPLEEN: The spleen is unremarkable. PANCREAS: The pancreas is unremarkable. ADRENAL GLANDS: Bilateral adrenal glands demonstrate no acute abnormality. KIDNEYS, URETERS AND BLADDER: No stones in the kidneys or ureters. No hydronephrosis. No perinephric or periureteral stranding. Urinary bladder is unremarkable. GI AND BOWEL: Severely dilated small bowel loops are noted with intramural gas in multiple small bowel loops consistent with ischemic bowel disease. No colonic dilatation is noted. Stomach and duodenal sweep demonstrate no acute abnormality. REPRODUCTIVE: Reproductive organs are unremarkable. PERITONEUM AND RETROPERITONEUM: Mild ascites is noted. 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 No free air. LYMPH NODES: No lymphadenopathy. BONES AND SOFT TISSUES: Status post bilateral hip arthroplasties. No acute abnormality of the bones. No acute soft tissue abnormality. IMPRESSION: 1. Severely dilated small bowel loops with intramural gas, consistent with ischemic bowel disease. This finding was discussed with Dr. Melvenia at 3:38 pm on 08/16/2024. 2. No active gastrointestinal bleeding identified. 3. Hepatic low-density lesions consistent with metastatic disease. 4. 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 Electronically signed by: Lynwood Seip MD 08/16/2024 03:40 PM EST RP Workstation: [id number]   US  ASCITES (ABDOMEN LIMITED) Result Date: 08/15/2024 CLINICAL DATA:  Patient with history of cholangiocarcinoma with malignant ascites, recently started on diuretic medication. Request for possible therapeutic paracentesis. EXAM: LIMITED ABDOMEN ULTRASOUND FOR ASCITES TECHNIQUE: Limited ultrasound survey for ascites was performed in all four abdominal quadrants. COMPARISON:  None Available. FINDINGS: Limited abdominal  ultrasound of all four abdominal quadrants shows very small midline right upper quadrant pocket of ascites. No other areas of peritoneal fluid seen. IMPRESSION: Very small midline right upper quadrant pocket of ascites, not amenable to safe paracentesis. No procedure performed. Electronically Signed   By: Wilkie Lent M.D.   On: 08/15/2024 14:08    Alm Schneider, DO  [facility]  If 7PM-7AM, please contact night-coverage [email or web address] Password [email or web address] 09/04/2024, 2:10 PM   LOS: 7 days   "

## 2024-09-04 NOTE — Plan of Care (Signed)
" °  Problem: Education: Goal: Knowledge of General Education information will improve Description: Including pain rating scale, medication(s)/side effects and non-pharmacologic comfort measures Outcome: Progressing   Problem: Health Behavior/Discharge Planning: Goal: Ability to manage health-related needs will improve Outcome: Progressing   Problem: Clinical Measurements: Goal: Ability to maintain clinical measurements within normal limits will improve Outcome: Progressing   Problem: Elimination: Goal: Will not experience complications related to bowel motility Outcome: Progressing   Problem: Clinical Measurements: Goal: Ability to maintain a body temperature in the normal range will improve Outcome: Progressing   "

## 2024-09-05 ENCOUNTER — Inpatient Hospital Stay (HOSPITAL_COMMUNITY)

## 2024-09-05 DIAGNOSIS — C221 Intrahepatic bile duct carcinoma: Secondary | ICD-10-CM | POA: Diagnosis not present

## 2024-09-05 DIAGNOSIS — J09X1 Influenza due to identified novel influenza A virus with pneumonia: Secondary | ICD-10-CM | POA: Diagnosis not present

## 2024-09-05 DIAGNOSIS — A419 Sepsis, unspecified organism: Secondary | ICD-10-CM | POA: Diagnosis not present

## 2024-09-05 DIAGNOSIS — K56609 Unspecified intestinal obstruction, unspecified as to partial versus complete obstruction: Secondary | ICD-10-CM | POA: Diagnosis not present

## 2024-09-05 LAB — CBC
HCT: 35.5 % — ABNORMAL LOW (ref 39.0–52.0)
Hemoglobin: 11.2 g/dL — ABNORMAL LOW (ref 13.0–17.0)
MCH: 30.9 pg (ref 26.0–34.0)
MCHC: 31.5 g/dL (ref 30.0–36.0)
MCV: 98.1 fL (ref 80.0–100.0)
Platelets: 263 K/uL (ref 150–400)
RBC: 3.62 MIL/uL — ABNORMAL LOW (ref 4.22–5.81)
RDW: 20.1 % — ABNORMAL HIGH (ref 11.5–15.5)
WBC: 8.2 K/uL (ref 4.0–10.5)
nRBC: 1 % — ABNORMAL HIGH (ref 0.0–0.2)

## 2024-09-05 LAB — COMPREHENSIVE METABOLIC PANEL WITH GFR
ALT: 136 U/L — ABNORMAL HIGH (ref 0–44)
AST: 317 U/L — ABNORMAL HIGH (ref 15–41)
Albumin: 2.9 g/dL — ABNORMAL LOW (ref 3.5–5.0)
Alkaline Phosphatase: 256 U/L — ABNORMAL HIGH (ref 38–126)
Anion gap: 11 (ref 5–15)
BUN: 16 mg/dL (ref 8–23)
CO2: 29 mmol/L (ref 22–32)
Calcium: 9 mg/dL (ref 8.9–10.3)
Chloride: 94 mmol/L — ABNORMAL LOW (ref 98–111)
Creatinine, Ser: 1.22 mg/dL (ref 0.61–1.24)
GFR, Estimated: >60 mL/min
Glucose, Bld: 94 mg/dL (ref 70–99)
Potassium: 3.9 mmol/L (ref 3.5–5.1)
Sodium: 133 mmol/L — ABNORMAL LOW (ref 135–145)
Total Bilirubin: 1.4 mg/dL — ABNORMAL HIGH (ref 0.0–1.2)
Total Protein: 5.9 g/dL — ABNORMAL LOW (ref 6.5–8.1)

## 2024-09-05 LAB — PATHOLOGIST SMEAR REVIEW

## 2024-09-05 LAB — MAGNESIUM: Magnesium: 1.9 mg/dL (ref 1.7–2.4)

## 2024-09-05 LAB — GLUCOSE, CAPILLARY
Glucose-Capillary: 110 mg/dL — ABNORMAL HIGH (ref 70–99)
Glucose-Capillary: 133 mg/dL — ABNORMAL HIGH (ref 70–99)
Glucose-Capillary: 118 mg/dL — ABNORMAL HIGH (ref 70–99)
Glucose-Capillary: 100 mg/dL — ABNORMAL HIGH (ref 70–99)

## 2024-09-05 MED ORDER — CHLORHEXIDINE GLUCONATE CLOTH 2 % EX PADS
6.0000 | MEDICATED_PAD | Freq: Every day | CUTANEOUS | Status: DC
  Administered 2024-09-05 – 2024-09-06 (×2): 6 via TOPICAL

## 2024-09-05 NOTE — Progress Notes (Signed)
 Appreciate IR input.  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  He is not a surgical candidate.  Options going forward include NG tube placement as needed, although this may be difficult in an outpatient setting.  Patient would need Zofran  as needed for his episodes of nausea and vomiting.  I strongly encourage hospice input as we are limited as to ongoing care for this patient in the hospital setting.  Palliative care is involved.  Discussed with Dr. Evonnie.

## 2024-09-05 NOTE — Progress Notes (Signed)
 "          PROGRESS NOTE  Marvin Carr FMW:[id number] DOB: 1956-01-21 DOA: 08/28/2024 PCP: [name], MD  Brief History:  68 y.o. 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 -He presented to ED secondary to dyspnea, lysed weakness, fatigue, he denies chest pain, reports orthopnea, cough, nonproductive, fever and chills at home, he has diarrhea which is chronic, forts weakness during ambulation yesterday where he felt his legs gave up or he had a fall, had oncology follow-up appointment today where he was noted to be febrile 100.3, tachypneic and tachycardic so he was brought to ED for further evaluation. -In ED workup significant for Lones AF pneumonia, CTA chest negative for PE but significant for multifocal pneumonia, labs were significant for Creatinine, elevated lactic acid at 4.1, improved with IV fluids, his respiratory panel positive for influenza A, negative UA, [facility] hospitalist consulted to admit.   Assessment/Plan: severe sepsis -- RESOLVED  - Fever, tachycardia, dyspnea, influenza A, hypotension given concern for severe sepsis.  Blood pressure responding well to IV fluid bolus.  No need for vasopressor medications at this time.  Blood cultures NGTD, - sepsis physiology resolved   Acute influenza A -- IMPROVING  - Rapid screen positive for influenza A.  -MRSA PCR negative and minimal leukocytosis, discontinued vancomycin .  Strep pneumo negative.    -He is completing a 5 day course of tamiflu . -stable on RA   Acute hypoxic respiratory failure -- RESOLVED  - Secondary to influenza A.   -Weaned to room air.   -- he continues to complain of severe cough and DOE  -He says he was experiencing this at least 1 month prior to influenza infection.    -CT angio chest that was neg for PE.  CXR 12/22 suggesting mild pulmonary edema,  -started IV lasix >>hold now,  -added bronchodilators and added scheduled cough syrup and incentive spirometry, encouraged ambulation,  -he completed 5 days of ceftriaxone  and azithromycin .     Partial SBO -12/23--pt had increase abd pain and distension -12/24 CT AP--Diffuse small bowel dilatation is stable to mildly progressive.  discrete transition zone is not identified but small bowel in the right lower quadrant is completely decompressed. Imaging features are compatible with small bowel obstruction -12/24--consulted general surgery -12/26--discussed with Dr. Mavis -12/26--discussed with IR--not a candidate for a venting G-tube -12/26--attempt NG placement   Positive blood culture -- 1/4 blood culture reported positive for Cutibacterium -- he was treated with IV ceftriaxone  2 gm daily x 5 doses  - represents contaminant   Metastatic cholangiocarcinoma - Noted peritoneal carcinomatosis and metastatic disease to the liver and bile duct.  Follows with oncology in the outpatient setting.  -last FOLFOX 07/29/24 -pt wants to follow up with Dr. [name]   CAD/HLD - S/p LCx stent 10/25.  Continue Brilinta .  Aspirin  discontinued during recent hospitalization per cardiology recommendations.       Acute on chronic HFpEF/HTN -Appear to be exacerbated and responding to IV furosemide  40 mg daily>>now stopped due to PSBO -- added  daily weights, strict I/Os, fluid restriction -- potassium supplement ordered -hold further lasix  for now as he appears euvolemic now -- hold nifedipine  and monitor BP   Diabetes  mellitus, type 2  - Insulin  sliding scale ordered. -07/03/24 A1C--5.7   BPH - Flomax  support.   Anxiety/depression - Resumed Wellbutrin  300 mg daily.   Transaminasemia -due to cholangiocarcinoma -repeat LFTs in am             Family Communication:   no Family at bedside   Consultants:  general surgery   Code Status:  DNR   DVT Prophylaxis:  [place] Heparin       Procedures: As Listed in Progress Note Above   Antibiotics: Ceftriaxone zenovia 12/18>>12/22         Subjective: Pt has nausea, no emesis.  Denies f/c cp, sob.  Abd pain about same.  Objective: Vitals:   09/04/24 1926 09/04/24 2058 09/05/24 0405 09/05/24 1419  BP: 130/88  (!) 140/94 (!) 136/99  Pulse: 94  99 96  Resp: 18     Temp: 98.2 F (36.8 C)  (!) 97.3 F (36.3 C) 98.7 F (37.1 C)  TempSrc: Oral  Oral Axillary  SpO2: 94% 94% 94% 97%  Weight:   110.9 kg   Height:        Intake/Output Summary (Last 24 hours) at 09/05/2024 1819 Last data filed at 09/05/2024 0406 Gross per 24 hour  Intake 720 ml  Output 300 ml  Net 420 ml   Weight change: -1.1 kg Exam:  General:  Pt is alert, follows commands appropriately, not in acute distress HEENT: No icterus, No thrush, No neck mass, [place]/AT Cardiovascular: RRR, S1/S2, no rubs, no gallops Respiratory: CTA bilaterally, no wheezing, no crackles, no rhonchi Abdomen: Soft/+BS, diffuse tender, + distended, no guarding Extremities: No edema, No lymphangitis, No petechiae, No rashes, no synovitis   Data Reviewed: I have personally reviewed following labs and imaging studies Basic Metabolic Panel: Recent Labs  Lab 08/30/24 0250 08/31/24 0423 09/02/24 0412 09/03/24 0412 09/05/24 0352  NA 135 138 137 134* 133*  K 3.2* 3.9 3.4* 4.1 3.9  CL 103 102 98 96* 94*  CO2 25 27 23 30 29   GLUCOSE 103* 87 99 100* 94  BUN 10 9 9 10 16   CREATININE 1.07 1.12 1.01 1.23 1.22  CALCIUM  7.6* 8.0* 8.7* 8.6* 9.0  MG  --   --   --   --  1.9   Liver Function  Tests: Recent Labs  Lab 09/05/24 0352  AST 317*  ALT 136*  ALKPHOS 256*  BILITOT 1.4*  PROT 5.9*  ALBUMIN  2.9*   No results for input(s): LIPASE, AMYLASE in the last 168 hours. No results for input(s): AMMONIA in the last 168 hours. Coagulation Profile: No results for input(s): INR, PROTIME in the last 168 hours. CBC: Recent Labs  Lab 08/30/24 0250 08/31/24 0423 09/02/24 0412 09/03/24 0412 09/05/24 0352  WBC 4.2 5.5 6.3 7.7 8.2  HGB 8.2* 9.5* 10.9* 10.3* 11.2*  HCT 26.6* 30.5* 34.5* 32.8* 35.5*  MCV 99.6 99.0 96.4 97.3 98.1  PLT 196 212 245 236 263   Cardiac Enzymes: No results for input(s): CKTOTAL, CKMB, CKMBINDEX, TROPONINI in the last 168 hours. BNP: Invalid input(s): POCBNP CBG: Recent Labs  Lab 09/04/24 1651 09/04/24 2139 09/05/24 0716 09/05/24 1107 09/05/24 1654  GLUCAP 111* 124* 110* 133* 118*   HbA1C: No results for input(s): HGBA1C in the last 72 hours. Urine analysis:    Component Value Date/Time  COLORURINE YELLOW 08/28/2024 1805   APPEARANCEUR CLEAR 08/28/2024 1805   LABSPEC 1.021 08/28/2024 1805   PHURINE 5.0 08/28/2024 1805   GLUCOSEU NEGATIVE 08/28/2024 1805   HGBUR NEGATIVE 08/28/2024 1805   BILIRUBINUR NEGATIVE 08/28/2024 1805   KETONESUR NEGATIVE 08/28/2024 1805   PROTEINUR NEGATIVE 08/28/2024 1805   NITRITE NEGATIVE 08/28/2024 1805   LEUKOCYTESUR NEGATIVE 08/28/2024 1805   Sepsis Labs: @LABRCNTIP (procalcitonin:4,lacticidven:4) ) Recent Results (from the past 240 hours)  Resp panel by RT-PCR (RSV, Flu A&B, Covid) Anterior Nasal Swab     Status: Abnormal   Collection Time: 08/28/24  1:21 PM   Specimen: Anterior Nasal Swab  Result Value Ref Range Status   SARS Coronavirus 2 by RT PCR NEGATIVE NEGATIVE Final    Comment: (NOTE) SARS-CoV-2 target nucleic acids are NOT DETECTED.  The SARS-CoV-2 RNA is generally detectable in upper respiratory specimens during the acute phase of infection. The  lowest concentration of SARS-CoV-2 viral copies this assay can detect is 138 copies/mL. A negative result does not preclude SARS-Cov-2 infection and should not be used as the sole basis for treatment or other patient management decisions. A negative result may occur with  improper specimen collection/handling, submission of specimen other than nasopharyngeal swab, presence of viral mutation(s) within the areas targeted by this assay, and inadequate number of viral copies(<138 copies/mL). A negative result must be combined with clinical observations, patient history, and epidemiological information. The expected result is Negative.  Fact Sheet for Patients:  bloggercourse.com  Fact Sheet for Healthcare Providers:  seriousbroker.it  This test is no t yet approved or cleared by the [place]  FDA and  has been authorized for detection and/or diagnosis of SARS-CoV-2 by FDA under an Emergency Use Authorization (EUA). This EUA will remain  in effect (meaning this test can be used) for the duration of the COVID-19 declaration under Section 564(b)(1) of the Act, 21 U.S.C.section 360bbb-3(b)(1), unless the authorization is terminated  or revoked sooner.       Influenza A by PCR POSITIVE (A) NEGATIVE Final   Influenza B by PCR NEGATIVE NEGATIVE Final    Comment: (NOTE) The Xpert Xpress SARS-CoV-2/FLU/RSV plus assay is intended as an aid in the diagnosis of influenza from Nasopharyngeal swab specimens and should not be used as a sole basis for treatment. Nasal washings and aspirates are unacceptable for Xpert Xpress SARS-CoV-2/FLU/RSV testing.  Fact Sheet for Patients: bloggercourse.com  Fact Sheet for Healthcare Providers: seriousbroker.it  This test is not yet approved or cleared by the [place]  FDA and has been authorized for detection and/or diagnosis of SARS-CoV-2 by FDA  under an Emergency Use Authorization (EUA). This EUA will remain in effect (meaning this test can be used) for the duration of the COVID-19 declaration under Section 564(b)(1) of the Act, 21 U.S.C. section 360bbb-3(b)(1), unless the authorization is terminated or revoked.     Resp Syncytial Virus by PCR NEGATIVE NEGATIVE Final    Comment: (NOTE) Fact Sheet for Patients: bloggercourse.com  Fact Sheet for Healthcare Providers: seriousbroker.it  This test is not yet approved or cleared by the [place]  FDA and has been authorized for detection and/or diagnosis of SARS-CoV-2 by FDA under an Emergency Use Authorization (EUA). This EUA will remain in effect (meaning this test can be used) for the duration of the COVID-19 declaration under Section 564(b)(1) of the Act, 21 U.S.C. section 360bbb-3(b)(1), unless the authorization is terminated or revoked.  Performed at [facility], [address] [place], [place] [address]   C Difficile  Quick Screen w PCR reflex     Status: None   Collection Time: 08/28/24  1:22 PM   Specimen: STOOL  Result Value Ref Range Status   C Diff antigen NEGATIVE NEGATIVE Final   C Diff toxin NEGATIVE NEGATIVE Final   C Diff interpretation No C. difficile detected.  Final    Comment: Performed at [facility], [address], [place], [place] [address]  Culture, blood (Routine x 2)     Status: Abnormal   Collection Time: 08/28/24  1:34 PM   Specimen: BLOOD RIGHT ARM  Result Value Ref Range Status   Specimen Description   Final    BLOOD RIGHT ARM Performed at [facility], [address], [place], [place] [address]    Special Requests   Final    BOTTLES DRAWN AEROBIC AND ANAEROBIC Blood Culture adequate volume Performed at [facility], [address], [place], [place] [address]    Culture  Setup Time   Final    GRAM POSITIVE RODS ANAEROBIC BOTTLE ONLY Gram Stain Report Called to,Read Back By and  Verified With: B BROOKS,RN@0300  09/02/24 MK GS REVA GPR BY DD 09/02/2024 @ 0502 Performed at [facility], [address], [place], [place] [address]    Culture CUTIBACTERIUM ACNES (A)  Final   Report Status 09/04/2024 FINAL  Final  Culture, blood (Routine x 2)     Status: None   Collection Time: 08/28/24  1:39 PM   Specimen: BLOOD  Result Value Ref Range Status   Specimen Description BLOOD BLOOD RIGHT ARM  Final   Special Requests   Final    BOTTLES DRAWN AEROBIC AND ANAEROBIC Blood Culture adequate volume   Culture   Final    NO GROWTH 5 DAYS Performed at [facility], [address], [place], [place] [address]    Report Status 09/02/2024 FINAL  Final  MRSA Next Gen by PCR, Nasal     Status: None   Collection Time: 08/28/24 10:28 PM  Result Value Ref Range Status   MRSA by PCR Next Gen NOT DETECTED NOT DETECTED Final    Comment: (NOTE) The GeneXpert MRSA Assay (FDA approved for NASAL specimens only), is one component of a comprehensive MRSA colonization surveillance program. It is not intended to diagnose MRSA infection nor to guide or monitor treatment for MRSA infections. Test performance is not FDA approved in patients less than 53 years old. Performed at [facility] [address], [place], [place] [address]   Gram stain     Status: None   Collection Time: 09/03/24 11:40 AM   Specimen: Ascitic  Result Value Ref Range Status   Specimen Description ASCITIC  Final   Special Requests ASCITIC  Final   Gram Stain   Final    NO WBC SEEN NO ORGANISMS SEEN CYTOSPIN SMEAR Performed at [facility], [address] [place], [place] [address]    Report Status 09/03/2024 FINAL  Final  Culture, body fluid w Gram Stain-bottle     Status: None (Preliminary result)   Collection Time: 09/03/24 11:40 AM   Specimen: Ascitic  Result Value Ref Range Status   Specimen Description ASCITIC  Final   Special Requests   Final    BOTTLES DRAWN AEROBIC AND ANAEROBIC Blood Culture  adequate volume   Culture   Final    NO GROWTH 2 DAYS Performed at [facility], [address], [place], [place] [address]    Report Status PENDING  Incomplete  Scheduled Meds:  atorvastatin   20 mg Oral q1800   buPROPion   300 mg Oral Daily   Chlorhexidine  Gluconate Cloth  6 each Topical Q0600   dextromethorphan   30 mg Oral BID   dicyclomine   10 mg Oral TID AC & HS   docusate sodium   200 mg Oral BID   famotidine   20 mg Oral QHS   fenofibrate   160 mg Oral Daily   heparin   5,000 Units Subcutaneous Q8H   midodrine   5 mg Oral TID WC   pantoprazole   40 mg Oral Daily   polyethylene glycol  17 g Oral BID   pramipexole   0.75 mg Oral TID   tamsulosin   0.4 mg Oral Daily   ticagrelor   90 mg Oral BID   zolpidem   5 mg Oral QHS   Continuous Infusions:  Procedures/Studies: DG Abd 1 View Result Date: 09/05/2024 EXAM: 1 VIEW XRAY OF THE ABDOMEN 09/05/2024 02:33:00 PM COMPARISON: 09/05/2024 CLINICAL HISTORY: Encounter for imaging study to confirm nasogastric (NG) tube placement. FINDINGS: LINES, TUBES AND DEVICES: Enter tube noted overlying the inferomediastinum with tube coursing caudally noted to make a hairpin turn at the GE junction and then coursing cranially along the mediastium with tip overlying the expected region of the mid to distal esophagus. Partially visualized central venous catheter in place with tip overlying the cavoatrial junction. BOWEL: Nonobstructive bowel gas pattern. SOFT TISSUES: No abnormal calcifications. BONES: No acute fracture. IMPRESSION: 1. Enter tube noted overlying the inferomediastinum with tube coursing caudally noted to make a hairpin turn at the GE junction and then coursing cranially along the mediastium with tip overlying the expected region of the mid to distal esophagus. Recommend repositioning or replacement. Electronically signed by: Morgane Naveau MD 09/05/2024 03:12 PM EST RP Workstation: [id number]   DG Abd 1 View Result Date: 09/05/2024 CLINICAL  DATA:  Nasogastric tube placement EXAM: DG ABDOMEN 1V COMPARISON:  Three days ago FINDINGS: Nasogastric tube appears to be looped in distal esophagus and not seen to enter stomach. Repositioning is recommended. IMPRESSION: Nasogastric tube appears to be looped in distal esophagus and not seen to enter stomach. Repositioning is recommended. Electronically Signed   By: Lynwood Landy Raddle M.D.   On: 09/05/2024 12:47   US  Paracentesis Result Date: 09/03/2024 INDICATION: Ascites EXAM: ULTRASOUND GUIDED therapeutic and diagnostic PARACENTESIS MEDICATIONS: None. COMPLICATIONS: None immediate. PROCEDURE: Informed written consent was obtained from the patient after a discussion of the risks, benefits and alternatives to treatment. A timeout was performed prior to the initiation of the procedure. Initial ultrasound scanning demonstrates a moderate amount of ascites within the right upper abdominal quadrant. The right lower abdomen was prepped and draped in the usual sterile fashion. 1% lidocaine  was used for local anesthesia. Following this, a Safe-T-Centesis catheter was introduced. An ultrasound image was saved for documentation purposes. The paracentesis was performed. The catheter was removed and a dressing was applied. The patient tolerated the procedure well without immediate post procedural complication. FINDINGS: A total of approximately 1.6 L of serous fluid was removed. Samples were sent to the laboratory as requested by the clinical team. IMPRESSION: Successful ultrasound-guided paracentesis yielding 1.6 liters of peritoneal fluid. Electronically Signed   By: Lynwood Landy Raddle M.D.   On: 09/03/2024 12:46   CT ABDOMEN PELVIS W CONTRAST Result Date: 09/03/2024 CLINICAL DATA:  Abdominal pain and distension. Bowel obstruction suspected. History of metastatic cholangiocarcinoma. * Tracking Code: BO * EXAM: CT ABDOMEN AND PELVIS WITH CONTRAST TECHNIQUE: Multidetector CT imaging of the abdomen and  pelvis was performed  using the standard protocol following bolus administration of intravenous contrast. RADIATION DOSE REDUCTION: This exam was performed according to the departmental dose-optimization program which includes automated exposure control, adjustment of the mA and/or kV according to patient size and/or use of iterative reconstruction technique. CONTRAST:  OMNIPAQUE  IOHEXOL  300 MG/ML  SOLN COMPARISON:  Abdomen and pelvis CTA 08/16/2024. FINDINGS: Lower chest: Moderate left pleural effusion with left base collapse/consolidation, similar to 08/16/2024. Hepatobiliary: 13 mm low-density lesion in the dome of the liver is stable in the interval. Additional low-density lesions in the left and right hepatic lobes measure up to 2.2 cm also stable since immediate prior study and remain compatible with metastatic disease. Gallbladder is distended with intraluminal debris and calcified stone. No intrahepatic or extrahepatic biliary dilation. Pancreas: No focal mass lesion. No dilatation of the main duct. No intraparenchymal cyst. No peripancreatic edema. Spleen: No splenomegaly. No suspicious focal mass lesion. Adrenals/Urinary Tract: No adrenal nodule or mass. Tiny well-defined homogeneous low-density lesions in both kidneys are too small to characterize but are statistically most likely benign and probably cysts. No followup imaging is recommended. No evidence for hydroureter. Bladder is obscured by beam hardening artifact from bilateral hip replacement. Stomach/Bowel: Stomach is moderately distended, similar to prior. Diffuse small bowel dilatation is stable to mildly progressive in the interval. Small bowel loops measure up to 5.3 cm diameter on the current study in the left upper quadrant. Small bowel pneumatosis seen previously has resolved. A discrete transition zone is not identified but small bowel in the right lower quadrant is completely decompressed. Colon is stool filled but nondilated. Vascular/Lymphatic: There is  moderate atherosclerotic calcification of the abdominal aorta without aneurysm. There is no gastrohepatic or hepatoduodenal ligament lymphadenopathy. No retroperitoneal or mesenteric lymphadenopathy. No pelvic sidewall lymphadenopathy. Reproductive: Prostate gland is obscured by beam hardening artifact. Other: Small to moderate volume ascites appears loculated in shows areas of apparent peritoneal enhancement, features compatible with carcinomatosis. There is nodularity in the omentum and mesentery suspicious for metastatic disease. Musculoskeletal: Bilateral hip replacement. No worrisome lytic or sclerotic osseous abnormality. IMPRESSION: 1. Diffuse small bowel dilatation is stable to mildly progressive in the interval. A discrete transition zone is not identified but small bowel in the right lower quadrant is completely decompressed. Imaging features are compatible with small bowel obstruction. 2. Small to moderate volume ascites appears loculated and shows areas of apparent peritoneal enhancement, features compatible with carcinomatosis. There is nodularity in the omentum and mesentery suspicious for metastatic disease. 3. Moderate left pleural effusion with left base collapse/consolidation, similar to 08/16/2024. 4. Stable low-density lesions in the liver compatible with metastatic disease. 5. Cholelithiasis. 6.  Aortic Atherosclerosis (ICD10-I70.0). Electronically Signed   By: Camellia Candle M.D.   On: 09/03/2024 10:50   DG Abd 1 View Result Date: 09/02/2024 CLINICAL DATA:  Abdominal discomfort.  Abdominal distension.  Pain. EXAM: ABDOMEN - 1 VIEW COMPARISON:  Radiograph 08/19/2024 FINDINGS: Dilated air-filled loops of bowel in the left mid abdomen, may represent redundant colon or small bowel. Moderate formed stool in the right and transverse colon. No evidence of free air on the supine views. Multiple pelvic phleboliths. IMPRESSION: Dilated air-filled loops of bowel in the left mid abdomen, may represent  redundant colon or small bowel. Consider further evaluation with CT. Electronically Signed   By: Andrea Gasman M.D.   On: 09/02/2024 15:21   DG CHEST PORT 1 VIEW Result Date: 09/01/2024 CLINICAL DATA:  Dyspnea. Influenza A. Shortness of breath. Chest  tightness. EXAM: PORTABLE CHEST 1 VIEW COMPARISON:  08/29/2019 radiograph and CT FINDINGS: Right chest port remains in place. Stable heart size and mediastinal contours. Diffuse interstitial coarsening, with slight progression from prior exam. Left pleural effusion and basilar opacity are similar. No pneumothorax. IMPRESSION: 1. Diffuse interstitial coarsening, with slight progression from prior exam, may represent atypical infection or pulmonary edema. 2. Left pleural effusion and basilar opacity are similar. Electronically Signed   By: Andrea Gasman M.D.   On: 09/01/2024 16:47   CT Angio Chest PE W and/or Wo Contrast Result Date: 08/28/2024 CLINICAL DATA:  Concern for pulmonary embolism. EXAM: CT ANGIOGRAPHY CHEST WITH CONTRAST TECHNIQUE: Multidetector CT imaging of the chest was performed using the standard protocol during bolus administration of intravenous contrast. Multiplanar CT image reconstructions and MIPs were obtained to evaluate the vascular anatomy. RADIATION DOSE REDUCTION: This exam was performed according to the departmental dose-optimization program which includes automated exposure control, adjustment of the mA and/or kV according to patient size and/or use of iterative reconstruction technique. CONTRAST:  75mL OMNIPAQUE  IOHEXOL  350 MG/ML SOLN COMPARISON:  Chest radiograph dated 08/28/2024. FINDINGS: Cardiovascular: There is no cardiomegaly or pericardial effusion. There is 3 vessel coronary vascular calcification. Mild atherosclerotic calcification of the thoracic aorta. No aneurysmal dilatation or dissection. The origins of the great vessels of the aortic arch appear patent. Right-sided Port-A-Cath with tip over central SVC. No  pulmonary artery embolus identified. Mediastinum/Nodes: No hilar or mediastinal adenopathy. The esophagus is grossly unremarkable no mediastinal fluid collection. Lungs/Pleura: Small left pleural effusion with partial compressive atelectasis of the left lower lobe. Patchy area of streaky and nodular density in the left upper lobe and lingula consistent with pneumonia. Aspiration is not excluded. Additional reticulonodular density at the right lung base may represent atelectasis or infiltrate. No pneumothorax. The central airways are patent. Upper Abdomen: Partially visualized ascites Musculoskeletal: Degenerative changes of the spine. No acute osseous pathology. Review of the MIP images confirms the above findings. IMPRESSION: 1. No CT evidence of pulmonary artery embolus. 2. Left upper lobe and lingular pneumonia. Aspiration is not excluded. 3. Small left pleural effusion with partial compressive atelectasis of the left lower lobe. 4. Partially visualized ascites. 5.  Aortic Atherosclerosis (ICD10-I70.0). Electronically Signed   By: Vanetta Chou M.D.   On: 08/28/2024 18:17   DG Chest Port 1 View Result Date: 08/28/2024 CLINICAL DATA:  Questionable sepsis - evaluate for abnormality EXAM: PORTABLE CHEST - 1 VIEW COMPARISON:  07/16/2024 FINDINGS: Right chest port is similarly positioned terminating in the lower SVC. Unchanged trace left pleural effusion. Streaky left basilar atelectasis. No pneumothorax or new airspace consolidation. The cardiac silhouette is at the upper limits of normal, likely accentuated by AP technique. Aortic atherosclerosis. No acute fracture or destructive lesions. Multilevel thoracic osteophytosis. IMPRESSION: Unchanged trace left pleural effusion with streaky left basilar atelectasis. Electronically Signed   By: Rogelia Myers M.D.   On: 08/28/2024 13:35   DG Abd Portable 1V Result Date: 08/19/2024 CLINICAL DATA:  Bowel obstruction. EXAM: PORTABLE ABDOMEN - 1 VIEW COMPARISON:   08/18/2024 FINDINGS: NG tube tip is in the proximal stomach. Although side port of the NG tube is not clearly visualized, it is likely in the distal esophagus. Gaseous distention of transverse colon noted without frank dilatation. Gas is visible in the nondistended rectum. Air is seen scattered through nondilated small bowel loops. IMPRESSION: 1. NG tube tip is in the proximal stomach. Although side port of the NG tube is not clearly visualized, it is  likely in the distal esophagus. 2. Nonspecific bowel gas pattern. Electronically Signed   By: Camellia Candle M.D.   On: 08/19/2024 07:21   DG Abd 1 View Result Date: 08/18/2024 CLINICAL DATA:  Enteric catheter placement EXAM: ABDOMEN - 1 VIEW COMPARISON:  08/17/2024 FINDINGS: Frontal view of the lower chest and upper abdomen demonstrates stable position of the enteric catheter, tip projecting over the gastric fundus. Side port projects proximally 2.5 cm above the left hemidiaphragm. Bowel gas pattern is unremarkable. Stable left pleural effusion and left lower lobe consolidation. IMPRESSION: 1. Enteric catheter tip projecting over the gastric fundus, with side port projecting 2.5 cm above the left hemidiaphragm. 2. Stable left pleural effusion and left basilar consolidation. Electronically Signed   By: Ozell Daring M.D.   On: 08/18/2024 17:10   DG Abd 1 View Result Date: 08/17/2024 EXAM: 1 VIEW XRAY OF THE ABDOMEN 08/17/2024 12:28:00 AM COMPARISON: None available. CLINICAL HISTORY: Encounter for nasogastric (NG) tube placement. FINDINGS: LINES, TUBES AND DEVICES: Enteric tube courses below the hemidiaphragm with the tip overlying the gastric lumen and side port not well visualized, likely in the region of the gastroesophageal junction. BOWEL: Nonobstructive bowel gas pattern. SOFT TISSUES: No abnormal calcifications. BONES: No acute fracture. LIMITATIONS: Right flank and pelvis collimated off view. IMPRESSION: 1. Enteric tube tip overlies the gastric lumen; side  port is not well visualized and is likely at the gastroesophageal junction, consider advancing the tube slightly to ensure the side port is intragastric. Electronically signed by: Morgane Naveau MD 08/17/2024 12:33 AM EST RP Workstation: [id number]   CT ANGIO GI BLEED Result Date: 08/16/2024 EXAM: CTA ABDOMEN AND PELVIS WITH CONTRAST 08/16/2024 03:23:46 PM TECHNIQUE: CTA images of the abdomen and pelvis with intravenous contrast. 100 mL iohexol  (OMNIPAQUE ) 350 MG/ML injection. Three-dimensional MIP/volume rendered formations were performed. Automated exposure control, iterative reconstruction, and/or weight based adjustment of the mA/kV was utilized to reduce the radiation dose to as low as reasonably achievable. COMPARISON: 05/15/2024 and 06/03/2024. CLINICAL HISTORY: Suspected GI bleed. FINDINGS: VASCULATURE: GI BLEED: No active extravasation of contrast within the GI tract. AORTA: No acute finding. No abdominal aortic aneurysm. No dissection. CELIAC TRUNK: No acute finding. No occlusion or significant stenosis. SUPERIOR MESENTERIC ARTERY: No acute finding. No occlusion or significant stenosis. INFERIOR MESENTERIC ARTERY: No acute finding. No occlusion or significant stenosis. RENAL ARTERIES: No acute finding. No occlusion or significant stenosis. ILIAC ARTERIES: No acute finding. No occlusion or significant stenosis. ABDOMEN/PELVIS: LOWER CHEST: Visualized portion of the lower chest demonstrates no acute abnormality. LIVER: Hepatic low density is again noted consistent with metastatic disease. GALLBLADDER AND BILE DUCTS: Cholelithiasis. No biliary ductal dilatation. SPLEEN: The spleen is unremarkable. PANCREAS: The pancreas is unremarkable. ADRENAL GLANDS: Bilateral adrenal glands demonstrate no acute abnormality. KIDNEYS, URETERS AND BLADDER: No stones in the kidneys or ureters. No hydronephrosis. No perinephric or periureteral stranding. Urinary bladder is unremarkable. GI AND BOWEL: Severely dilated small  bowel loops are noted with intramural gas in multiple small bowel loops consistent with ischemic bowel disease. No colonic dilatation is noted. Stomach and duodenal sweep demonstrate no acute abnormality. REPRODUCTIVE: Reproductive organs are unremarkable. PERITONEUM AND RETROPERITONEUM: Mild ascites is noted. 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 No free air. LYMPH NODES: No lymphadenopathy. BONES AND SOFT TISSUES: Status post bilateral hip arthroplasties. No acute abnormality of the bones. No acute soft tissue abnormality. IMPRESSION: 1. Severely dilated small bowel loops with intramural gas, consistent with ischemic bowel disease. This finding was discussed with Dr.  Dixon at 3:38 pm on 08/16/2024. 2. No active gastrointestinal bleeding identified. 3. Hepatic low-density lesions consistent with metastatic disease. 4. 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 Electronically signed by: Lynwood Seip MD 08/16/2024 03:40 PM EST RP Workstation: [id number]   US  ASCITES (ABDOMEN LIMITED) Result Date: 08/15/2024 CLINICAL DATA:  Patient with history of cholangiocarcinoma with malignant ascites, recently started on diuretic medication. Request for possible therapeutic paracentesis. EXAM: LIMITED ABDOMEN ULTRASOUND FOR ASCITES TECHNIQUE: Limited ultrasound survey for ascites was performed in all four abdominal quadrants. COMPARISON:  None Available. FINDINGS: Limited abdominal ultrasound of all four abdominal quadrants shows very small midline right upper quadrant pocket of ascites. No other areas of peritoneal fluid seen. IMPRESSION: Very small midline right upper quadrant pocket of ascites, not amenable to safe paracentesis. No procedure performed. Electronically Signed   By: Wilkie Lent M.D.   On: 08/15/2024 14:08    Alm Schneider, DO  [facility]  If 7PM-7AM, please contact night-coverage [email or web address] Password [facility] 09/05/2024, 6:19 PM    LOS: 8 days   "

## 2024-09-05 NOTE — Progress Notes (Signed)
 Mobility Specialist Progress Note:    09/05/24 1100  Mobility  Activity Pivoted/transferred from chair to bed  Level of Assistance Standby assist, set-up cues, supervision of patient - no hands on  Assistive Device Front wheel walker  Distance Ambulated (ft) 3 ft  Range of Motion/Exercises Active;All extremities  Activity Response Tolerated well  Mobility Referral Yes  Mobility visit 1 Mobility  Mobility Specialist Start Time (ACUTE ONLY) 1100  Mobility Specialist Stop Time (ACUTE ONLY) 1115  Mobility Specialist Time Calculation (min) (ACUTE ONLY) 15 min   Pt received in chair, requesting assistance to bed. Required SBA to stand and transfer with RW. Tolerated well,asx throughout. RN in room, all needs met.  Lasheka Kempner Mobility Specialist Please contact via Special Educational Needs Teacher or  Rehab office at [phone]

## 2024-09-05 NOTE — Progress Notes (Signed)
 "   Subjective: Did have 1 episode of nausea and vomiting this morning.  States his abdominal pain is slightly better with Bentyl  and hydrocodone .  Is passing flatus but has not had a bowel movement over the past 24 hours.  Objective: Vital signs in last 24 hours: Temp:  [97.3 F (36.3 C)-98.2 F (36.8 C)] 97.3 F (36.3 C) (12/26 0405) Pulse Rate:  [88-99] 99 (12/26 0405) Resp:  [18-20] 18 (12/25 1926) BP: (126-140)/(82-94) 140/94 (12/26 0405) SpO2:  [94 %-97 %] 94 % (12/26 0405) Weight:  [110.9 kg] 110.9 kg (12/26 0405) Last BM Date : 09/02/24  Intake/Output from previous day: 12/25 0701 - 12/26 0700 In: 1080 [P.O.:1080] Out: 300 [Urine:300] Intake/Output this shift: No intake/output data recorded.  General appearance: alert, cooperative, and no distress GI: Soft, mildly distended.  Unchanged from yesterday.  No rigidity noted.  Lab Results:  Recent Labs    09/03/24 0412 09/05/24 0352  WBC 7.7 8.2  HGB 10.3* 11.2*  HCT 32.8* 35.5*  PLT 236 263   BMET Recent Labs    09/03/24 0412 09/05/24 0352  NA 134* 133*  K 4.1 3.9  CL 96* 94*  CO2 30 29  GLUCOSE 100* 94  BUN 10 16  CREATININE 1.23 1.22  CALCIUM  8.6* 9.0   PT/INR No results for input(s): LABPROT, INR in the last 72 hours.  Studies/Results: US  Paracentesis Result Date: 09/03/2024 INDICATION: Ascites EXAM: ULTRASOUND GUIDED therapeutic and diagnostic PARACENTESIS MEDICATIONS: None. COMPLICATIONS: None immediate. PROCEDURE: Informed written consent was obtained from the patient after a discussion of the risks, benefits and alternatives to treatment. A timeout was performed prior to the initiation of the procedure. Initial ultrasound scanning demonstrates a moderate amount of ascites within the right upper abdominal quadrant. The right lower abdomen was prepped and draped in the usual sterile fashion. 1% lidocaine  was used for local anesthesia. Following this, a Safe-T-Centesis catheter was introduced. An  ultrasound image was saved for documentation purposes. The paracentesis was performed. The catheter was removed and a dressing was applied. The patient tolerated the procedure well without immediate post procedural complication. FINDINGS: A total of approximately 1.6 L of serous fluid was removed. Samples were sent to the laboratory as requested by the clinical team. IMPRESSION: Successful ultrasound-guided paracentesis yielding 1.6 liters of peritoneal fluid. Electronically Signed   By: Lynwood Landy Raddle M.D.   On: 09/03/2024 12:46    Anti-infectives: Anti-infectives (From admission, onward)    Start     Dose/Rate Route Frequency Ordered Stop   08/29/24 2000  azithromycin  (ZITHROMAX ) 500 mg in sodium chloride  0.9 % 250 mL IVPB        500 mg 250 mL/hr over 60 Minutes Intravenous Every 24 hours 08/28/24 1957 09/01/24 2101   08/29/24 1300  cefTRIAXone  (ROCEPHIN ) 2 g in sodium chloride  0.9 % 100 mL IVPB        2 g 200 mL/hr over 30 Minutes Intravenous Every 24 hours 08/28/24 1957 09/01/24 1515   08/28/24 2200  vancomycin  (VANCOREADY) IVPB 500 mg/100 mL        500 mg 100 mL/hr over 60 Minutes Intravenous  Once 08/28/24 2011 08/29/24 0728   08/28/24 2030  vancomycin  (VANCOREADY) IVPB 2000 mg/400 mL  Status:  Discontinued        2,000 mg 200 mL/hr over 120 Minutes Intravenous Every 24 hours 08/28/24 2006 08/29/24 1110   08/28/24 2000  oseltamivir  (TAMIFLU ) capsule 75 mg        75 mg Oral 2  times daily 08/28/24 1953 09/02/24 0909   08/28/24 1900  azithromycin  (ZITHROMAX ) 500 mg in sodium chloride  0.9 % 250 mL IVPB        500 mg 250 mL/hr over 60 Minutes Intravenous  Once 08/28/24 1852 08/28/24 2023   08/28/24 1300  cefTRIAXone  (ROCEPHIN ) 2 g in sodium chloride  0.9 % 100 mL IVPB        2 g 200 mL/hr over 30 Minutes Intravenous  Once 08/28/24 1254 08/28/24 1416       Assessment/Plan: Impression: Partial small bowel obstruction secondary to metastatic cholangiocarcinoma with carcinomatosis.   Awaiting word from IR as to whether he is a candidate for gastrostomy tube placement.  LOS: 8 days    Marvin Carr 09/05/2024  "

## 2024-09-05 NOTE — Progress Notes (Signed)
 " Daily Progress Note   Date: 09/05/2024   Patient Name: Marvin Carr  DOB: 13-Nov-1955  MRN: [id number]  Age / Sex: 68 y.o., male  Attending Physician: Evonnie Lenis, MD Primary Care Physician: [name], MD Admit Date: 08/28/2024 Length of Stay: 8 days  Reason for Follow-up: Establishing goals of care  Past Medical History:  Diagnosis Date   Anxiety    Arthritis    Bladder cancer (HCC) 09/11/2009   CAD (coronary artery disease), native coronary artery    a. Mildly elevated troponin 03/2013, cath with nonobstructive disease including 50% AV groove distal stenosis before large OM   Depression    Essential hypertension    Headache(784.0)    History of migraines   Hyperglycemia    Metastatic cancer to liver (HCC)    Mixed hyperlipidemia    Neuropathy    Obesity    Port-A-Cath in place 09/30/2021   Pre-diabetes    Seasonal allergies    Sleep apnea    On CPAP    Subjective:   Subjective: Chart Reviewed. Updates received. Patient Assessed. Created space and opportunity for patient  and family to explore thoughts and feelings regarding current medical situation.  Today's Discussion: Today before meeting with the patient/family, I reviewed the chart notes including hospitalist note from yesterday, surgery note from yesterday, nursing notes from yesterday, surgery note from today, radiology PA note from today. I also reviewed vital signs, nursing flowsheets, medication administrations record, labs, and imaging. Labs reviewed include CBC with persistent normal white count at 8.2 in the setting of sepsis, CMP with stable creatinine at 1.22 in the setting of n.p.o but receiving IV hydration, AST/ALT elevated at 317/136, alk phos 256, total bilirubin 1.4 (ALT increased in the past week) in the setting of metastatic cholangiocarcinoma with mets to the liver.  Today saw the patient at the bedside, no family is present.  He states he feels rough today.  He was having nausea and an episode  of vomiting earlier today, continued abdominal pain.  He states he feels like it is going to explode.  We followed up on previous conversations about possible IR placing venting G-tube.  The nurse came in during my conversation stating they are planning to place an NG tube today.  We did talk about NG tube and venting G-tube (possible) and that it is not a fix.  We talked about if we are at the point of needing a venting G-tube with metastatic cancer as described above that his outcomes are not good.  I offered to discuss statistics and outlook as well as prognosis, but the patient asked if we could defer this conversation to another time as he is very overwhelmed right now.  I shared that we are willing to have this conversation at any point that he is agreeable to it.  He states that he will be agreeable to it just not today.  I shared that I did place a spiritual care consult hopefully the chaplain will be able to follow-up soon for ongoing spiritual support.  I shared that I would be at a different facility next week but a colleague will be back on Tuesday and I will request her to check in with him if he remains admitted.  I encouraged him to continue ongoing GOC conversations, considering his values and priorities as well as the current clinical picture.  I provided emotional and general support through therapeutic listening, empathy, sharing of stories, and other techniques. I answered all questions  and addressed all concerns to the best of my ability.  Review of Systems  Respiratory:  Negative for shortness of breath.   Gastrointestinal:  Positive for abdominal pain, nausea and vomiting.    Objective:   Primary Diagnoses: Present on Admission:  Severe sepsis (HCC)  Cholangiocarcinoma metastatic to liver (HCC)  CAD (coronary artery disease), native coronary artery  Essential hypertension   Vital Signs:  BP (!) 140/94 (BP Location: Right Arm)   Pulse 99   Temp (!) 97.3 F (36.3 C)  (Oral)   Resp 18   Ht 6' (1.829 m)   Wt 110.9 kg   SpO2 94%   BMI 33.16 kg/m   Physical Exam Vitals and nursing note reviewed.  Constitutional:      General: He is not in acute distress.    Appearance: He is ill-appearing. He is not toxic-appearing.     Comments: Sitting in the bedside chair  HENT:     Head: Normocephalic and atraumatic.  Cardiovascular:     Rate and Rhythm: Normal rate.  Pulmonary:     Effort: Pulmonary effort is normal. No respiratory distress.  Abdominal:     General: There is distension.     Tenderness: There is abdominal tenderness.     Comments: Abdomen firm  Skin:    General: Skin is warm and dry.  Neurological:     General: No focal deficit present.     Mental Status: He is alert and oriented to person, place, and time.  Psychiatric:        Mood and Affect: Mood normal.        Behavior: Behavior normal.     Palliative Assessment/Data: 50-60%   Existing Vynca/ACP Documentation: None  Assessment & Plan:   HPI/Patient Profile:  68 y.o. 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  He presented to the ED secondary to dyspnea, increased weakness, fatigue and after ED evaluation he was admitted on 08/28/2024 with severe sepsis, cute influenza A, acute hypoxic respiratory failure, metastatic cholangiocarcinoma, abdominal distention, partial SBO, and others.    Palliative medicine was consulted for GOC conversations.  SUMMARY OF RECOMMENDATIONS   DNR-Limited (DNR/DNI) Continue current scope of treatment Awaiting decision from IR on possible venting G-tube Open to ongoing GOC conversations including discussion on  prognosis and statistical outcomes Palliative medicine will follow-up 09/09/2024 when back on service  Symptom Management:  Per primary team Palliative medicine is available to assist as needed  Code Status: DNR - Limited (DNR/DNI)  Prognosis: < 6 months  Discharge Planning: To Be Determined  Discussed with: Patient, medical team, nursing team  Thank you for allowing us  to participate in the care of Anyelo Mccue PMT will continue to support holistically.  Time Total: 35 min  Detailed review of medical records (labs, imaging, vital signs), medically appropriate exam, discussed with treatment team, counseling and education to patient, family, & staff, documenting clinical information, medication management, coordination of care  Camellia Kays, NP Palliative Medicine Team  Team Phone # [phone] (Nights/Weekends)  05/10/2021, 8:17 AM  "

## 2024-09-05 NOTE — Progress Notes (Signed)
 Interventional Radiology Brief Note:  Marvin Carr is a 68 year old male with history of metastatic cholangiocarcinoma diagnosed in 2022 unfortunately which has progressed to include peritoneal carcinomatosis with recurrent, partial small bowel obstruction with persistent nausea, vomiting.  IR consulted for possible percutaneous gastrostomy tube placement.  Case reviewed by Dr. Hughes who notes recent development  of ascites with paracentesis x2 in the past 3 months.  Patient is not a candidate for percutaneous placement in IR.  Discussed with Dr. Evonnie.   Will cancel order for procedure at this time.   Tytan Sandate, MS RD PA-C

## 2024-09-06 ENCOUNTER — Inpatient Hospital Stay (HOSPITAL_COMMUNITY)

## 2024-09-06 ENCOUNTER — Encounter: Payer: Self-pay | Admitting: General Surgery

## 2024-09-06 DIAGNOSIS — J09X1 Influenza due to identified novel influenza A virus with pneumonia: Secondary | ICD-10-CM | POA: Diagnosis not present

## 2024-09-06 DIAGNOSIS — A419 Sepsis, unspecified organism: Secondary | ICD-10-CM | POA: Diagnosis not present

## 2024-09-06 DIAGNOSIS — C221 Intrahepatic bile duct carcinoma: Secondary | ICD-10-CM | POA: Diagnosis not present

## 2024-09-06 DIAGNOSIS — K56609 Unspecified intestinal obstruction, unspecified as to partial versus complete obstruction: Secondary | ICD-10-CM | POA: Diagnosis not present

## 2024-09-06 DIAGNOSIS — R6520 Severe sepsis without septic shock: Secondary | ICD-10-CM | POA: Diagnosis not present

## 2024-09-06 DIAGNOSIS — J690 Pneumonitis due to inhalation of food and vomit: Secondary | ICD-10-CM | POA: Insufficient documentation

## 2024-09-06 LAB — CBC
HCT: 40 % (ref 39.0–52.0)
Hemoglobin: 12.6 g/dL — ABNORMAL LOW (ref 13.0–17.0)
MCH: 30.9 pg (ref 26.0–34.0)
MCHC: 31.5 g/dL (ref 30.0–36.0)
MCV: 98 fL (ref 80.0–100.0)
Platelets: 279 K/uL (ref 150–400)
RBC: 4.08 MIL/uL — ABNORMAL LOW (ref 4.22–5.81)
RDW: 20 % — ABNORMAL HIGH (ref 11.5–15.5)
WBC: 18.4 K/uL — ABNORMAL HIGH (ref 4.0–10.5)
nRBC: 0.9 % — ABNORMAL HIGH (ref 0.0–0.2)

## 2024-09-06 LAB — COMPREHENSIVE METABOLIC PANEL WITH GFR
ALT: 206 U/L — ABNORMAL HIGH (ref 0–44)
AST: 284 U/L — ABNORMAL HIGH (ref 15–41)
Albumin: 3 g/dL — ABNORMAL LOW (ref 3.5–5.0)
Alkaline Phosphatase: 361 U/L — ABNORMAL HIGH (ref 38–126)
Anion gap: 15 (ref 5–15)
BUN: 21 mg/dL (ref 8–23)
CO2: 25 mmol/L (ref 22–32)
Calcium: 9.1 mg/dL (ref 8.9–10.3)
Chloride: 93 mmol/L — ABNORMAL LOW (ref 98–111)
Creatinine, Ser: 1.39 mg/dL — ABNORMAL HIGH (ref 0.61–1.24)
GFR, Estimated: 55 mL/min — ABNORMAL LOW
Glucose, Bld: 101 mg/dL — ABNORMAL HIGH (ref 70–99)
Potassium: 3.7 mmol/L (ref 3.5–5.1)
Sodium: 133 mmol/L — ABNORMAL LOW (ref 135–145)
Total Bilirubin: 1.2 mg/dL (ref 0.0–1.2)
Total Protein: 6.2 g/dL — ABNORMAL LOW (ref 6.5–8.1)

## 2024-09-06 LAB — GLUCOSE, CAPILLARY
Glucose-Capillary: 108 mg/dL — ABNORMAL HIGH (ref 70–99)
Glucose-Capillary: 149 mg/dL — ABNORMAL HIGH (ref 70–99)

## 2024-09-06 MED ORDER — PIPERACILLIN-TAZOBACTAM 3.375 G IVPB
3.3750 g | Freq: Three times a day (TID) | INTRAVENOUS | Status: DC
  Administered 2024-09-06: 3.375 g via INTRAVENOUS
  Filled 2024-09-06: qty 50

## 2024-09-06 MED ORDER — HYDROMORPHONE HCL 1 MG/ML IJ SOLN
1.0000 mg | INTRAMUSCULAR | Status: DC | PRN
  Administered 2024-09-06: 1 mg via INTRAVENOUS
  Filled 2024-09-06: qty 1

## 2024-09-06 MED ORDER — LORAZEPAM 2 MG/ML IJ SOLN
1.0000 mg | INTRAMUSCULAR | Status: DC
  Administered 2024-09-06: 1 mg via INTRAVENOUS
  Filled 2024-09-06: qty 1

## 2024-09-06 MED ORDER — ONDANSETRON HCL 4 MG/2ML IJ SOLN
4.0000 mg | Freq: Four times a day (QID) | INTRAMUSCULAR | Status: DC | PRN
  Administered 2024-09-06 (×2): 4 mg via INTRAVENOUS
  Filled 2024-09-06 (×2): qty 2

## 2024-09-06 MED ORDER — ALBUTEROL SULFATE (2.5 MG/3ML) 0.083% IN NEBU: 2.5000 mg | INHALATION_SOLUTION | RESPIRATORY_TRACT | Status: DC | PRN

## 2024-09-06 MED ORDER — PANTOPRAZOLE SODIUM 40 MG IV SOLR
40.0000 mg | Freq: Two times a day (BID) | INTRAVENOUS | Status: DC
  Administered 2024-09-06: 40 mg via INTRAVENOUS
  Filled 2024-09-06: qty 10

## 2024-09-06 MED ORDER — IPRATROPIUM-ALBUTEROL 0.5-2.5 (3) MG/3ML IN SOLN
3.0000 mL | Freq: Three times a day (TID) | RESPIRATORY_TRACT | Status: DC
  Administered 2024-09-06: 3 mL via RESPIRATORY_TRACT
  Filled 2024-09-06 (×2): qty 3

## 2024-09-06 MED ORDER — LACTATED RINGERS IV SOLN: INTRAVENOUS | Status: DC

## 2024-09-08 ENCOUNTER — Inpatient Hospital Stay

## 2024-09-08 LAB — CULTURE, BODY FLUID W GRAM STAIN -BOTTLE
Culture: NO GROWTH
Special Requests: ADEQUATE

## 2024-09-11 NOTE — Progress Notes (Signed)
 Patient had emesis at this time after taking nighttime medication.  Emesis was brown and foul smelling.  Patient voices how long do you think I have patient given time to express thoughts and feelings.  States every time he eats or drinks he has emesis.  He states he is ready for hospice.

## 2024-09-11 NOTE — Progress Notes (Signed)
 "          PROGRESS NOTE  Marvin Carr FMW:[id number] DOB: 1956-07-28 DOA: 08/28/2024 PCP: [name], MD  Brief History:  69 y.o. 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 -He presented to ED secondary to dyspnea, lysed weakness, fatigue, he denies chest pain, reports orthopnea, cough, nonproductive, fever and chills at home, he has diarrhea which is chronic, forts weakness during ambulation yesterday where he felt his legs gave up or he had a fall, had oncology follow-up appointment today where he was noted to be febrile 100.3, tachypneic and tachycardic so he was brought to ED for further evaluation. -In ED workup significant for Lones AF pneumonia, CTA chest negative for PE but significant for multifocal pneumonia, labs were significant for Creatinine, elevated lactic acid at 4.1, improved with IV fluids, his respiratory panel positive for influenza A, negative UA, [facility] hospitalist consulted to admit. The patient was started on oseltamavir and bronchodilators.  He was felt to have a degree of fluid overload and started on IV furosemide  for period of 3 days.  The patient was started on ceftriaxone  and azithromycin  which she received for 5 days.  He improved clinically with resolution of his sepsis physiology. Unfortunately, patient began having increasing abdominal distention, abdominal pain, and vomiting.  CT abdomen and pelvis confirmed partial small bowel  obstruction.  General surgery was consulted.  The patient was not felt to be a surgical candidate.  IR was consulted for consideration of palliative gastrostomy tube placement for venting.  After discussion with IR, they felt the patient was a poor candidate for any further intervention.  Palliative medicine was consulted.  Goals of care were discussed with the patient.  The patient continued to deteriorate with increasing abdominal pain, increasing abdominal distention, and vomiting.  The patient was transition to DNR/DNI. NG tube was attempted, numerous times without success. On morning 09-12-24, further goals of care discussions were held with the patient.  The patient confirmed DNR/DNI.  He stated that he did not want any further invasive interventions or radiographic studies.  He did not want to be transferred to another hospital at this point.  He was agreeable to continued antibiotics, fluids, and other medications as indicated.   Assessment/Plan: severe sepsis -- RESOLVED  - Fever, tachycardia, dyspnea, influenza A, hypotension given concern for severe sepsis.  Blood pressure responding well to IV fluid bolus.  No need for vasopressor medications at this time.  Blood cultures NGTD, - sepsis physiology resolved   Acute influenza A -- IMPROVING  - Rapid screen positive for influenza A.  -MRSA PCR negative and minimal leukocytosis, discontinued vancomycin .  Strep pneumo negative.   -He is completing a 5 day course of tamiflu . -stable on RA   Acute hypoxic respiratory failure -- RESOLVED  - Secondary to influenza A.   -Weaned to room air initially -12/27--desat to 88% RA -CT angio chest that was neg for PE.  CXR 12/22 suggesting mild pulmonary edema,  -initially started IV lasix >>hold now,  -added bronchodilators and added  scheduled cough syrup and incentive spirometry, encouraged ambulation,  -he completed 5 days of ceftriaxone  and azithromycin .  - 12/27>>start zosyn  for aspiration  pneumonia  Aspiration Pneumonia -12/27--start zosyn     Partial SBO -12/23--pt had increase abd pain and distension -12/24 CT AP--Diffuse small bowel dilatation is stable to mildly progressive.  discrete transition zone is not identified but small bowel in the right lower quadrant is completely decompressed. Imaging features are compatible with small bowel obstruction -12/24--consulted general surgery -12/26--discussed with Dr. Mavis -12/26--discussed with IR--not a candidate for a venting G-tube -12/26--attempt NG placement x 2 unsuccessful -12/27--abd remains distended with continued n/v   Positive blood culture -- 1/4 blood culture reported positive for Cutibacterium -- he was treated with IV ceftriaxone  2 gm daily x 5 doses  - represents contaminant   Metastatic cholangiocarcinoma - Noted peritoneal carcinomatosis and metastatic disease to the liver and bile duct.  Follows with oncology in the outpatient setting.  -last FOLFOX 07/29/24 -pt wants to follow up with Dr. [name]   CAD/HLD - S/p LCx stent 10/25.  Continue Brilinta .  Aspirin  discontinued during recent hospitalization per cardiology recommendations.   -no chest pain presently     Acute on chronic HFpEF/HTN -Appear to be exacerbated and responding to IV furosemide  40 mg daily>>now stopped due to PSBO -- added daily weights, strict I/Os, fluid restriction -- potassium supplement ordered -hold further lasix  for now as he appears euvolemic now -- hold nifedipine >>BPs now soft   Diabetes mellitus, type 2  - Insulin  sliding scale ordered. -07/03/24 A1C--5.7   BPH - Flomax  .   Anxiety/depression - Resumed Wellbutrin  300 mg daily.   Transaminasemia -due to cholangiocarcinoma -repeat LFTs in am   Goals of care -12/27--we discussed the patient's overall poor prognosis in the setting of his multiple comorbidities, severity of acute medical condition, repeated hospitalizations and recurrent small bowel  obstruction -We discussed that multiple specialist have felt that the patient is no longer a candidate for any operative intervention or invasive procedures -12/27--the patient states that he did not want any further radiographic studies at this point as he is not a candidate for any operative or invasive intervention.  He stated that he did not want any further blood draws.  He does request continuing fluids and antibiotics and any medications to facilitate comfort -He confirms DNR/DNI            Procedures: As Listed in Progress Note Above   Antibiotics: Ceftriaxone zenovia 12/18>>12/22 -zosyn  12/27>>      Total time spent 50 minutes.  Greater than 50% spent face to face counseling and coordinating care.    Subjective: Pt continues to have abd pain and distension.  He had numerous episodes of emesis.  Denies cp.  Has some sob.  No f/c, headache, neck pain  Objective: Vitals:   2024-09-20 0319 09/20/2024 0329 09/20/2024 0418 09/20/24 0606  BP:  128/85 133/88 105/81  Pulse:   (!) 134 (!) 126  Resp:   16 (!) 24  Temp:   98.5 F (36.9 C) 100.1 F (37.8 C)  TempSrc:   Oral Axillary  SpO2: (!) 88%  93% 98%  Weight:   111.6 kg   Height:        Intake/Output Summary (Last 24 hours) at 2024/09/20 0825 Last data filed at 09/20/2024 0256 Gross per 24 hour  Intake 240 ml  Output 900 ml  Net -660 ml   Weight change: 0.7 kg Exam:  General:  Pt is alert, follows commands appropriately,  not in acute distress HEENT: No icterus, No thrush, No neck mass, [place]/AT Cardiovascular: RRR, S1/S2, no rubs, no gallops Respiratory: bibasilar rales.  No wheeze Abdomen: Soft/+BS, diffuse tender, + distended, no guarding Extremities: no LE edema, No lymphangitis, No petechiae, No rashes, no synovitis   Data Reviewed: I have personally reviewed following labs and imaging studies Basic Metabolic Panel: Recent Labs  Lab 08/31/24 0423 09/02/24 0412 09/03/24 0412 09/05/24 0352 2024/09/17 0412   NA 138 137 134* 133* 133*  K 3.9 3.4* 4.1 3.9 3.7  CL 102 98 96* 94* 93*  CO2 27 23 30 29 25   GLUCOSE 87 99 100* 94 101*  BUN 9 9 10 16 21   CREATININE 1.12 1.01 1.23 1.22 1.39*  CALCIUM  8.0* 8.7* 8.6* 9.0 9.1  MG  --   --   --  1.9  --    Liver Function Tests: Recent Labs  Lab 09/05/24 0352 Sep 17, 2024 0412  AST 317* 284*  ALT 136* 206*  ALKPHOS 256* 361*  BILITOT 1.4* 1.2  PROT 5.9* 6.2*  ALBUMIN  2.9* 3.0*   No results for input(s): LIPASE, AMYLASE in the last 168 hours. No results for input(s): AMMONIA in the last 168 hours. Coagulation Profile: No results for input(s): INR, PROTIME in the last 168 hours. CBC: Recent Labs  Lab 08/31/24 0423 09/02/24 0412 09/03/24 0412 09/05/24 0352 Sep 17, 2024 0412  WBC 5.5 6.3 7.7 8.2 18.4*  HGB 9.5* 10.9* 10.3* 11.2* 12.6*  HCT 30.5* 34.5* 32.8* 35.5* 40.0  MCV 99.0 96.4 97.3 98.1 98.0  PLT 212 245 236 263 279   Cardiac Enzymes: No results for input(s): CKTOTAL, CKMB, CKMBINDEX, TROPONINI in the last 168 hours. BNP: Invalid input(s): POCBNP CBG: Recent Labs  Lab 09/05/24 0716 09/05/24 1107 09/05/24 1654 09/05/24 2048 17-Sep-2024 0739  GLUCAP 110* 133* 118* 100* 108*   HbA1C: No results for input(s): HGBA1C in the last 72 hours. Urine analysis:    Component Value Date/Time   COLORURINE YELLOW 08/28/2024 1805   APPEARANCEUR CLEAR 08/28/2024 1805   LABSPEC 1.021 08/28/2024 1805   PHURINE 5.0 08/28/2024 1805   GLUCOSEU NEGATIVE 08/28/2024 1805   HGBUR NEGATIVE 08/28/2024 1805   BILIRUBINUR NEGATIVE 08/28/2024 1805   KETONESUR NEGATIVE 08/28/2024 1805   PROTEINUR NEGATIVE 08/28/2024 1805   NITRITE NEGATIVE 08/28/2024 1805   LEUKOCYTESUR NEGATIVE 08/28/2024 1805   Sepsis Labs: @LABRCNTIP (procalcitonin:4,lacticidven:4) ) Recent Results (from the past 240 hours)  Resp panel by RT-PCR (RSV, Flu A&B, Covid) Anterior Nasal Swab     Status: Abnormal   Collection Time: 08/28/24  1:21 PM   Specimen:  Anterior Nasal Swab  Result Value Ref Range Status   SARS Coronavirus 2 by RT PCR NEGATIVE NEGATIVE Final    Comment: (NOTE) SARS-CoV-2 target nucleic acids are NOT DETECTED.  The SARS-CoV-2 RNA is generally detectable in upper respiratory specimens during the acute phase of infection. The lowest concentration of SARS-CoV-2 viral copies this assay can detect is 138 copies/mL. A negative result does not preclude SARS-Cov-2 infection and should not be used as the sole basis for treatment or other patient management decisions. A negative result may occur with  improper specimen collection/handling, submission of specimen other than nasopharyngeal swab, presence of viral mutation(s) within the areas targeted by this assay, and inadequate number of viral copies(<138 copies/mL). A negative result must be combined with clinical observations, patient history, and epidemiological information. The expected result is Negative.  Fact Sheet for Patients:  bloggercourse.com  Fact Sheet for Healthcare Providers:  seriousbroker.it  This test is no t yet approved or cleared by the [place]  FDA and  has been authorized for detection and/or diagnosis of SARS-CoV-2 by FDA under an Emergency Use Authorization (EUA). This EUA will remain  in effect (meaning this test can be used) for the duration of the COVID-19 declaration under Section 564(b)(1) of the Act, 21 U.S.C.section 360bbb-3(b)(1), unless the authorization is terminated  or revoked sooner.       Influenza A by PCR POSITIVE (A) NEGATIVE Final   Influenza B by PCR NEGATIVE NEGATIVE Final    Comment: (NOTE) The Xpert Xpress SARS-CoV-2/FLU/RSV plus assay is intended as an aid in the diagnosis of influenza from Nasopharyngeal swab specimens and should not be used as a sole basis for treatment. Nasal washings and aspirates are unacceptable for Xpert Xpress  SARS-CoV-2/FLU/RSV testing.  Fact Sheet for Patients: bloggercourse.com  Fact Sheet for Healthcare Providers: seriousbroker.it  This test is not yet approved or cleared by the [place]  FDA and has been authorized for detection and/or diagnosis of SARS-CoV-2 by FDA under an Emergency Use Authorization (EUA). This EUA will remain in effect (meaning this test can be used) for the duration of the COVID-19 declaration under Section 564(b)(1) of the Act, 21 U.S.C. section 360bbb-3(b)(1), unless the authorization is terminated or revoked.     Resp Syncytial Virus by PCR NEGATIVE NEGATIVE Final    Comment: (NOTE) Fact Sheet for Patients: bloggercourse.com  Fact Sheet for Healthcare Providers: seriousbroker.it  This test is not yet approved or cleared by the [place]  FDA and has been authorized for detection and/or diagnosis of SARS-CoV-2 by FDA under an Emergency Use Authorization (EUA). This EUA will remain in effect (meaning this test can be used) for the duration of the COVID-19 declaration under Section 564(b)(1) of the Act, 21 U.S.C. section 360bbb-3(b)(1), unless the authorization is terminated or revoked.  Performed at [facility], [address], [place], [place] [address]   C Difficile Quick Screen w PCR reflex     Status: None   Collection Time: 08/28/24  1:22 PM   Specimen: STOOL  Result Value Ref Range Status   C Diff antigen NEGATIVE NEGATIVE Final   C Diff toxin NEGATIVE NEGATIVE Final   C Diff interpretation No C. difficile detected.  Final    Comment: Performed at [facility], [address], [place], [place] [address]  Culture, blood (Routine x 2)     Status: Abnormal   Collection Time: 08/28/24  1:34 PM   Specimen: BLOOD RIGHT ARM  Result Value Ref Range Status   Specimen Description   Final    BLOOD RIGHT ARM Performed at [facility],  [address] [place], [place] [address]    Special Requests   Final    BOTTLES DRAWN AEROBIC AND ANAEROBIC Blood Culture adequate volume Performed at [facility], [address], [place], [place] [address]    Culture  Setup Time   Final    GRAM POSITIVE RODS ANAEROBIC BOTTLE ONLY Gram Stain Report Called to,Read Back By and Verified With: B BROOKS,RN@0300  09/02/24 MK GS REVA GPR BY DD 09/02/2024 @ 0502 Performed at [facility], [address], [place], [place] [address]    Culture CUTIBACTERIUM ACNES (A)  Final   Report Status 09/04/2024 FINAL  Final  Culture, blood (Routine x 2)     Status: None   Collection Time: 08/28/24  1:39 PM   Specimen: BLOOD  Result Value Ref Range Status  Specimen Description BLOOD BLOOD RIGHT ARM  Final   Special Requests   Final    BOTTLES DRAWN AEROBIC AND ANAEROBIC Blood Culture adequate volume   Culture   Final    NO GROWTH 5 DAYS Performed at [facility], [address] [place], [place] [address]    Report Status 09/02/2024 FINAL  Final  MRSA Next Gen by PCR, Nasal     Status: None   Collection Time: 08/28/24 10:28 PM  Result Value Ref Range Status   MRSA by PCR Next Gen NOT DETECTED NOT DETECTED Final    Comment: (NOTE) The GeneXpert MRSA Assay (FDA approved for NASAL specimens only), is one component of a comprehensive MRSA colonization surveillance program. It is not intended to diagnose MRSA infection nor to guide or monitor treatment for MRSA infections. Test performance is not FDA approved in patients less than 67 years old. Performed at [facility], [address], [place], [place] [address]   Gram stain     Status: None   Collection Time: 09/03/24 11:40 AM   Specimen: Ascitic  Result Value Ref Range Status   Specimen Description ASCITIC  Final   Special Requests ASCITIC  Final   Gram Stain   Final    NO WBC SEEN NO ORGANISMS SEEN CYTOSPIN SMEAR Performed at [facility], [address] [place], [place] [address]     Report Status 09/03/2024 FINAL  Final  Culture, body fluid w Gram Stain-bottle     Status: None (Preliminary result)   Collection Time: 09/03/24 11:40 AM   Specimen: Ascitic  Result Value Ref Range Status   Specimen Description ASCITIC  Final   Special Requests   Final    BOTTLES DRAWN AEROBIC AND ANAEROBIC Blood Culture adequate volume   Culture   Final    NO GROWTH 2 DAYS Performed at [facility], [address] [place], [place] [address]    Report Status PENDING  Incomplete     Scheduled Meds:  atorvastatin   20 mg Oral q1800   buPROPion   300 mg Oral Daily   Chlorhexidine  Gluconate Cloth  6 each Topical Q0600   dextromethorphan   30 mg Oral BID   dicyclomine   10 mg Oral TID AC & HS   docusate sodium   200 mg Oral BID   famotidine   20 mg Oral QHS   fenofibrate   160 mg Oral Daily   heparin   5,000 Units Subcutaneous Q8H   ipratropium-albuterol   3 mL Nebulization TID   midodrine   5 mg Oral TID WC   pantoprazole   40 mg Oral Daily   polyethylene glycol  17 g Oral BID   pramipexole   0.75 mg Oral TID   tamsulosin   0.4 mg Oral Daily   ticagrelor   90 mg Oral BID   zolpidem   5 mg Oral QHS   Continuous Infusions:  Procedures/Studies: DG Abd 1 View Result Date: 09/05/2024 EXAM: 1 VIEW XRAY OF THE ABDOMEN 09/05/2024 02:33:00 PM COMPARISON: 09/05/2024 CLINICAL HISTORY: Encounter for imaging study to confirm nasogastric (NG) tube placement. FINDINGS: LINES, TUBES AND DEVICES: Enter tube noted overlying the inferomediastinum with tube coursing caudally noted to make a hairpin turn at the GE junction and then coursing cranially along the mediastium with tip overlying the expected region of the mid to distal esophagus. Partially visualized central venous catheter in place with tip overlying the cavoatrial junction. BOWEL: Nonobstructive bowel gas pattern. SOFT TISSUES: No abnormal calcifications. BONES: No acute fracture. IMPRESSION: 1. Enter tube noted overlying the inferomediastinum with  tube  coursing caudally noted to make a hairpin turn at the GE junction and then coursing cranially along the mediastium with tip overlying the expected region of the mid to distal esophagus. Recommend repositioning or replacement. Electronically signed by: Morgane Naveau MD 09/05/2024 03:12 PM EST RP Workstation: [id number]   DG Abd 1 View Result Date: 09/05/2024 CLINICAL DATA:  Nasogastric tube placement EXAM: DG ABDOMEN 1V COMPARISON:  Three days ago FINDINGS: Nasogastric tube appears to be looped in distal esophagus and not seen to enter stomach. Repositioning is recommended. IMPRESSION: Nasogastric tube appears to be looped in distal esophagus and not seen to enter stomach. Repositioning is recommended. Electronically Signed   By: Lynwood Landy Raddle M.D.   On: 09/05/2024 12:47   US  Paracentesis Result Date: 09/03/2024 INDICATION: Ascites EXAM: ULTRASOUND GUIDED therapeutic and diagnostic PARACENTESIS MEDICATIONS: None. COMPLICATIONS: None immediate. PROCEDURE: Informed written consent was obtained from the patient after a discussion of the risks, benefits and alternatives to treatment. A timeout was performed prior to the initiation of the procedure. Initial ultrasound scanning demonstrates a moderate amount of ascites within the right upper abdominal quadrant. The right lower abdomen was prepped and draped in the usual sterile fashion. 1% lidocaine  was used for local anesthesia. Following this, a Safe-T-Centesis catheter was introduced. An ultrasound image was saved for documentation purposes. The paracentesis was performed. The catheter was removed and a dressing was applied. The patient tolerated the procedure well without immediate post procedural complication. FINDINGS: A total of approximately 1.6 L of serous fluid was removed. Samples were sent to the laboratory as requested by the clinical team. IMPRESSION: Successful ultrasound-guided paracentesis yielding 1.6 liters of peritoneal fluid. Electronically  Signed   By: Lynwood Landy Raddle M.D.   On: 09/03/2024 12:46   CT ABDOMEN PELVIS W CONTRAST Result Date: 09/03/2024 CLINICAL DATA:  Abdominal pain and distension. Bowel obstruction suspected. History of metastatic cholangiocarcinoma. * Tracking Code: BO * EXAM: CT ABDOMEN AND PELVIS WITH CONTRAST TECHNIQUE: Multidetector CT imaging of the abdomen and pelvis was performed using the standard protocol following bolus administration of intravenous contrast. RADIATION DOSE REDUCTION: This exam was performed according to the departmental dose-optimization program which includes automated exposure control, adjustment of the mA and/or kV according to patient size and/or use of iterative reconstruction technique. CONTRAST:  OMNIPAQUE  IOHEXOL  300 MG/ML  SOLN COMPARISON:  Abdomen and pelvis CTA 08/16/2024. FINDINGS: Lower chest: Moderate left pleural effusion with left base collapse/consolidation, similar to 08/16/2024. Hepatobiliary: 13 mm low-density lesion in the dome of the liver is stable in the interval. Additional low-density lesions in the left and right hepatic lobes measure up to 2.2 cm also stable since immediate prior study and remain compatible with metastatic disease. Gallbladder is distended with intraluminal debris and calcified stone. No intrahepatic or extrahepatic biliary dilation. Pancreas: No focal mass lesion. No dilatation of the main duct. No intraparenchymal cyst. No peripancreatic edema. Spleen: No splenomegaly. No suspicious focal mass lesion. Adrenals/Urinary Tract: No adrenal nodule or mass. Tiny well-defined homogeneous low-density lesions in both kidneys are too small to characterize but are statistically most likely benign and probably cysts. No followup imaging is recommended. No evidence for hydroureter. Bladder is obscured by beam hardening artifact from bilateral hip replacement. Stomach/Bowel: Stomach is moderately distended, similar to prior. Diffuse small bowel dilatation is stable  to mildly progressive in the interval. Small bowel loops measure up to 5.3 cm diameter on the current study in the left upper quadrant. Small bowel pneumatosis seen previously  has resolved. A discrete transition zone is not identified but small bowel in the right lower quadrant is completely decompressed. Colon is stool filled but nondilated. Vascular/Lymphatic: There is moderate atherosclerotic calcification of the abdominal aorta without aneurysm. There is no gastrohepatic or hepatoduodenal ligament lymphadenopathy. No retroperitoneal or mesenteric lymphadenopathy. No pelvic sidewall lymphadenopathy. Reproductive: Prostate gland is obscured by beam hardening artifact. Other: Small to moderate volume ascites appears loculated in shows areas of apparent peritoneal enhancement, features compatible with carcinomatosis. There is nodularity in the omentum and mesentery suspicious for metastatic disease. Musculoskeletal: Bilateral hip replacement. No worrisome lytic or sclerotic osseous abnormality. IMPRESSION: 1. Diffuse small bowel dilatation is stable to mildly progressive in the interval. A discrete transition zone is not identified but small bowel in the right lower quadrant is completely decompressed. Imaging features are compatible with small bowel obstruction. 2. Small to moderate volume ascites appears loculated and shows areas of apparent peritoneal enhancement, features compatible with carcinomatosis. There is nodularity in the omentum and mesentery suspicious for metastatic disease. 3. Moderate left pleural effusion with left base collapse/consolidation, similar to 08/16/2024. 4. Stable low-density lesions in the liver compatible with metastatic disease. 5. Cholelithiasis. 6.  Aortic Atherosclerosis (ICD10-I70.0). Electronically Signed   By: Camellia Candle M.D.   On: 09/03/2024 10:50   DG Abd 1 View Result Date: 09/02/2024 CLINICAL DATA:  Abdominal discomfort.  Abdominal distension.  Pain. EXAM: ABDOMEN -  1 VIEW COMPARISON:  Radiograph 08/19/2024 FINDINGS: Dilated air-filled loops of bowel in the left mid abdomen, may represent redundant colon or small bowel. Moderate formed stool in the right and transverse colon. No evidence of free air on the supine views. Multiple pelvic phleboliths. IMPRESSION: Dilated air-filled loops of bowel in the left mid abdomen, may represent redundant colon or small bowel. Consider further evaluation with CT. Electronically Signed   By: Andrea Gasman M.D.   On: 09/02/2024 15:21   DG CHEST PORT 1 VIEW Result Date: 09/01/2024 CLINICAL DATA:  Dyspnea. Influenza A. Shortness of breath. Chest tightness. EXAM: PORTABLE CHEST 1 VIEW COMPARISON:  08/29/2019 radiograph and CT FINDINGS: Right chest port remains in place. Stable heart size and mediastinal contours. Diffuse interstitial coarsening, with slight progression from prior exam. Left pleural effusion and basilar opacity are similar. No pneumothorax. IMPRESSION: 1. Diffuse interstitial coarsening, with slight progression from prior exam, may represent atypical infection or pulmonary edema. 2. Left pleural effusion and basilar opacity are similar. Electronically Signed   By: Andrea Gasman M.D.   On: 09/01/2024 16:47   CT Angio Chest PE W and/or Wo Contrast Result Date: 08/28/2024 CLINICAL DATA:  Concern for pulmonary embolism. EXAM: CT ANGIOGRAPHY CHEST WITH CONTRAST TECHNIQUE: Multidetector CT imaging of the chest was performed using the standard protocol during bolus administration of intravenous contrast. Multiplanar CT image reconstructions and MIPs were obtained to evaluate the vascular anatomy. RADIATION DOSE REDUCTION: This exam was performed according to the departmental dose-optimization program which includes automated exposure control, adjustment of the mA and/or kV according to patient size and/or use of iterative reconstruction technique. CONTRAST:  75mL OMNIPAQUE  IOHEXOL  350 MG/ML SOLN COMPARISON:  Chest  radiograph dated 08/28/2024. FINDINGS: Cardiovascular: There is no cardiomegaly or pericardial effusion. There is 3 vessel coronary vascular calcification. Mild atherosclerotic calcification of the thoracic aorta. No aneurysmal dilatation or dissection. The origins of the great vessels of the aortic arch appear patent. Right-sided Port-A-Cath with tip over central SVC. No pulmonary artery embolus identified. Mediastinum/Nodes: No hilar or mediastinal adenopathy. The esophagus is grossly  unremarkable no mediastinal fluid collection. Lungs/Pleura: Small left pleural effusion with partial compressive atelectasis of the left lower lobe. Patchy area of streaky and nodular density in the left upper lobe and lingula consistent with pneumonia. Aspiration is not excluded. Additional reticulonodular density at the right lung base may represent atelectasis or infiltrate. No pneumothorax. The central airways are patent. Upper Abdomen: Partially visualized ascites Musculoskeletal: Degenerative changes of the spine. No acute osseous pathology. Review of the MIP images confirms the above findings. IMPRESSION: 1. No CT evidence of pulmonary artery embolus. 2. Left upper lobe and lingular pneumonia. Aspiration is not excluded. 3. Small left pleural effusion with partial compressive atelectasis of the left lower lobe. 4. Partially visualized ascites. 5.  Aortic Atherosclerosis (ICD10-I70.0). Electronically Signed   By: Vanetta Chou M.D.   On: 08/28/2024 18:17   DG Chest Port 1 View Result Date: 08/28/2024 CLINICAL DATA:  Questionable sepsis - evaluate for abnormality EXAM: PORTABLE CHEST - 1 VIEW COMPARISON:  07/16/2024 FINDINGS: Right chest port is similarly positioned terminating in the lower SVC. Unchanged trace left pleural effusion. Streaky left basilar atelectasis. No pneumothorax or new airspace consolidation. The cardiac silhouette is at the upper limits of normal, likely accentuated by AP technique. Aortic  atherosclerosis. No acute fracture or destructive lesions. Multilevel thoracic osteophytosis. IMPRESSION: Unchanged trace left pleural effusion with streaky left basilar atelectasis. Electronically Signed   By: Rogelia Myers M.D.   On: 08/28/2024 13:35   DG Abd Portable 1V Result Date: 08/19/2024 CLINICAL DATA:  Bowel obstruction. EXAM: PORTABLE ABDOMEN - 1 VIEW COMPARISON:  08/18/2024 FINDINGS: NG tube tip is in the proximal stomach. Although side port of the NG tube is not clearly visualized, it is likely in the distal esophagus. Gaseous distention of transverse colon noted without frank dilatation. Gas is visible in the nondistended rectum. Air is seen scattered through nondilated small bowel loops. IMPRESSION: 1. NG tube tip is in the proximal stomach. Although side port of the NG tube is not clearly visualized, it is likely in the distal esophagus. 2. Nonspecific bowel gas pattern. Electronically Signed   By: Camellia Candle M.D.   On: 08/19/2024 07:21   DG Abd 1 View Result Date: 08/18/2024 CLINICAL DATA:  Enteric catheter placement EXAM: ABDOMEN - 1 VIEW COMPARISON:  08/17/2024 FINDINGS: Frontal view of the lower chest and upper abdomen demonstrates stable position of the enteric catheter, tip projecting over the gastric fundus. Side port projects proximally 2.5 cm above the left hemidiaphragm. Bowel gas pattern is unremarkable. Stable left pleural effusion and left lower lobe consolidation. IMPRESSION: 1. Enteric catheter tip projecting over the gastric fundus, with side port projecting 2.5 cm above the left hemidiaphragm. 2. Stable left pleural effusion and left basilar consolidation. Electronically Signed   By: Ozell Daring M.D.   On: 08/18/2024 17:10   DG Abd 1 View Result Date: 08/17/2024 EXAM: 1 VIEW XRAY OF THE ABDOMEN 08/17/2024 12:28:00 AM COMPARISON: None available. CLINICAL HISTORY: Encounter for nasogastric (NG) tube placement. FINDINGS: LINES, TUBES AND DEVICES: Enteric tube courses  below the hemidiaphragm with the tip overlying the gastric lumen and side port not well visualized, likely in the region of the gastroesophageal junction. BOWEL: Nonobstructive bowel gas pattern. SOFT TISSUES: No abnormal calcifications. BONES: No acute fracture. LIMITATIONS: Right flank and pelvis collimated off view. IMPRESSION: 1. Enteric tube tip overlies the gastric lumen; side port is not well visualized and is likely at the gastroesophageal junction, consider advancing the tube slightly to ensure the side port  is intragastric. Electronically signed by: Morgane Naveau MD 08/17/2024 12:33 AM EST RP Workstation: [id number]   CT ANGIO GI BLEED Result Date: 08/16/2024 EXAM: CTA ABDOMEN AND PELVIS WITH CONTRAST 08/16/2024 03:23:46 PM TECHNIQUE: CTA images of the abdomen and pelvis with intravenous contrast. 100 mL iohexol  (OMNIPAQUE ) 350 MG/ML injection. Three-dimensional MIP/volume rendered formations were performed. Automated exposure control, iterative reconstruction, and/or weight based adjustment of the mA/kV was utilized to reduce the radiation dose to as low as reasonably achievable. COMPARISON: 05/15/2024 and 06/03/2024. CLINICAL HISTORY: Suspected GI bleed. FINDINGS: VASCULATURE: GI BLEED: No active extravasation of contrast within the GI tract. AORTA: No acute finding. No abdominal aortic aneurysm. No dissection. CELIAC TRUNK: No acute finding. No occlusion or significant stenosis. SUPERIOR MESENTERIC ARTERY: No acute finding. No occlusion or significant stenosis. INFERIOR MESENTERIC ARTERY: No acute finding. No occlusion or significant stenosis. RENAL ARTERIES: No acute finding. No occlusion or significant stenosis. ILIAC ARTERIES: No acute finding. No occlusion or significant stenosis. ABDOMEN/PELVIS: LOWER CHEST: Visualized portion of the lower chest demonstrates no acute abnormality. LIVER: Hepatic low density is again noted consistent with metastatic disease. GALLBLADDER AND BILE DUCTS:  Cholelithiasis. No biliary ductal dilatation. SPLEEN: The spleen is unremarkable. PANCREAS: The pancreas is unremarkable. ADRENAL GLANDS: Bilateral adrenal glands demonstrate no acute abnormality. KIDNEYS, URETERS AND BLADDER: No stones in the kidneys or ureters. No hydronephrosis. No perinephric or periureteral stranding. Urinary bladder is unremarkable. GI AND BOWEL: Severely dilated small bowel loops are noted with intramural gas in multiple small bowel loops consistent with ischemic bowel disease. No colonic dilatation is noted. Stomach and duodenal sweep demonstrate no acute abnormality. REPRODUCTIVE: Reproductive organs are unremarkable. PERITONEUM AND RETROPERITONEUM: Mild ascites is noted. 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 No free air. LYMPH NODES: No lymphadenopathy. BONES AND SOFT TISSUES: Status post bilateral hip arthroplasties. No acute abnormality of the bones. No acute soft tissue abnormality. IMPRESSION: 1. Severely dilated small bowel loops with intramural gas, consistent with ischemic bowel disease. This finding was discussed with Dr. Melvenia at 3:38 pm on 08/16/2024. 2. No active gastrointestinal bleeding identified. 3. Hepatic low-density lesions consistent with metastatic disease. 4. 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 Electronically signed by: Lynwood Seip MD 08/16/2024 03:40 PM EST RP Workstation: [id number]   US  ASCITES (ABDOMEN LIMITED) Result Date: 08/15/2024 CLINICAL DATA:  Patient with history of cholangiocarcinoma with malignant ascites, recently started on diuretic medication. Request for possible therapeutic paracentesis. EXAM: LIMITED ABDOMEN ULTRASOUND FOR ASCITES TECHNIQUE: Limited ultrasound survey for ascites was performed in all four abdominal quadrants. COMPARISON:  None Available. FINDINGS: Limited abdominal ultrasound of all four abdominal quadrants shows very small midline right upper quadrant pocket of  ascites. No other areas of peritoneal fluid seen. IMPRESSION: Very small midline right upper quadrant pocket of ascites, not amenable to safe paracentesis. No procedure performed. Electronically Signed   By: Wilkie Lent M.D.   On: 08/15/2024 14:08    Alm Schneider, DO  [facility]  If 7PM-7AM, please contact night-coverage [email or web address] Password [email or web address] 14-Sep-2024, 8:25 AM   LOS: 9 days   "

## 2024-09-11 NOTE — Progress Notes (Signed)
 I had a goals of care discussion with the patient earlier in the morning around 8 AM.  The patient confirmed DNR/DNI.  He stated that he wanted to have me meet his aunt and cousin who are traveling in from out of town to visit him.  He wanted them present to further discuss goals of care.  I met the patient's aunt Marvin Carr and cousin Marvin Carr around 1:55 PM for goals of care discussion at the bedside. I briefly summarized the patient's care during this hospitalization to his family. We discussed what [name] would want should his health continue to deteriorate.  It was clear that Mr. Seal went to be free from any pain and suffering. We discussed that [name] has  felt the good fight, and he states that he is now feeling tired and weak.  [name] feels that he is given all his will and offered infighting this cancer.  We discussed his overall poor prognosis due to the progression of his cancer, severity of his acute medical illness, and his several comorbidities.  We discussed that he is no longer a candidate for any operative or invasive intervention, and that such intervention would only further create harm, pain, and suffering. We discussed that continue medical therapy at this point would only continue to prolong his pain and suffering and would not alter the natural outcome of his metastatic cancer.  We discussed at that even if he were stable enough to go home, he would have a repeat hospitalization. During the entire conversation, the patient was clearly more dyspneic and was complaining of nausea and wanted to throw up.  We discussed comfort care measures and palliation and what that would look like for the patient.  We discussed on burning him from his many medications as well as from further blood draws and diagnostic studies.  I told the patient that I would ensure that he would not struggle to breathe or suffer from any further pain.  The patient's aunt and niece stated that  they did not want the patient to have any further suffering or pain.  Ultimately, the patient stated that he did not want any further blood draws, diagnostic studies, or any invasive procedures.  He wanted to focus more on comfort to relieve his pain and shortness of breath.  He agreed to transition to full comfort measures.  The patient's cousin and aunt also agreed with the transition to full comfort measures.  Subsequently, his medications with curative intent were discontinued.  The patient was started on Dilaudid  and Ativan .  Alm Schneider, DO

## 2024-09-11 NOTE — Progress Notes (Signed)
 "   Subjective: Patient states his abdominal discomfort is well-controlled with morphine .  He is passing flatus but has not had a bowel movement.  Objective: Vital signs in last 24 hours: Temp:  [97.9 F (36.6 C)-100.1 F (37.8 C)] 100.1 F (37.8 C) (12/27 0606) Pulse Rate:  [95-134] 126 (12/27 0606) Resp:  [16-24] 24 (12/27 0606) BP: (105-136)/(81-99) 105/81 (12/27 0606) SpO2:  [88 %-98 %] 98 % (12/27 0606) Weight:  [111.6 kg] 111.6 kg (12/27 0418) Last BM Date : 09/02/24  Intake/Output from previous day: 12/26 0701 - 12/27 0700 In: 240 [P.O.:240] Out: 900 [Emesis/NG output:900] Intake/Output this shift: No intake/output data recorded.  General appearance: alert, cooperative, and no distress GI: Soft with only mild distention noted.  No rigidity is noted.  No point tenderness noted.  Lab Results:  Recent Labs    09/05/24 0352 09-18-24 0412  WBC 8.2 18.4*  HGB 11.2* 12.6*  HCT 35.5* 40.0  PLT 263 279   BMET Recent Labs    09/05/24 0352 Sep 18, 2024 0412  NA 133* 133*  K 3.9 3.7  CL 94* 93*  CO2 29 25  GLUCOSE 94 101*  BUN 16 21  CREATININE 1.22 1.39*  CALCIUM  9.0 9.1   PT/INR No results for input(s): LABPROT, INR in the last 72 hours.  Studies/Results: DG Abd 1 View Result Date: 09/05/2024 EXAM: 1 VIEW XRAY OF THE ABDOMEN 09/05/2024 02:33:00 PM COMPARISON: 09/05/2024 CLINICAL HISTORY: Encounter for imaging study to confirm nasogastric (NG) tube placement. FINDINGS: LINES, TUBES AND DEVICES: Enter tube noted overlying the inferomediastinum with tube coursing caudally noted to make a hairpin turn at the GE junction and then coursing cranially along the mediastium with tip overlying the expected region of the mid to distal esophagus. Partially visualized central venous catheter in place with tip overlying the cavoatrial junction. BOWEL: Nonobstructive bowel gas pattern. SOFT TISSUES: No abnormal calcifications. BONES: No acute fracture. IMPRESSION: 1. Enter tube  noted overlying the inferomediastinum with tube coursing caudally noted to make a hairpin turn at the GE junction and then coursing cranially along the mediastium with tip overlying the expected region of the mid to distal esophagus. Recommend repositioning or replacement. Electronically signed by: Morgane Naveau MD 09/05/2024 03:12 PM EST RP Workstation: [id number]   DG Abd 1 View Result Date: 09/05/2024 CLINICAL DATA:  Nasogastric tube placement EXAM: DG ABDOMEN 1V COMPARISON:  Three days ago FINDINGS: Nasogastric tube appears to be looped in distal esophagus and not seen to enter stomach. Repositioning is recommended. IMPRESSION: Nasogastric tube appears to be looped in distal esophagus and not seen to enter stomach. Repositioning is recommended. Electronically Signed   By: Lynwood Landy Raddle M.D.   On: 09/05/2024 12:47    Anti-infectives: Anti-infectives (From admission, onward)    Start     Dose/Rate Route Frequency Ordered Stop   09-18-2024 1000  piperacillin -tazobactam (ZOSYN ) IVPB 3.375 g        3.375 g 12.5 mL/hr over 240 Minutes Intravenous Every 8 hours 09/18/2024 0836     08/29/24 2000  azithromycin  (ZITHROMAX ) 500 mg in sodium chloride  0.9 % 250 mL IVPB        500 mg 250 mL/hr over 60 Minutes Intravenous Every 24 hours 08/28/24 1957 09/01/24 2101   08/29/24 1300  cefTRIAXone  (ROCEPHIN ) 2 g in sodium chloride  0.9 % 100 mL IVPB        2 g 200 mL/hr over 30 Minutes Intravenous Every 24 hours 08/28/24 1957 09/01/24 1515   08/28/24 2200  vancomycin  (VANCOREADY) IVPB 500 mg/100 mL        500 mg 100 mL/hr over 60 Minutes Intravenous  Once 08/28/24 2011 08/29/24 0728   08/28/24 2030  vancomycin  (VANCOREADY) IVPB 2000 mg/400 mL  Status:  Discontinued        2,000 mg 200 mL/hr over 120 Minutes Intravenous Every 24 hours 08/28/24 2006 08/29/24 1110   08/28/24 2000  oseltamivir  (TAMIFLU ) capsule 75 mg        75 mg Oral 2 times daily 08/28/24 1953 09/02/24 0909   08/28/24 1900  azithromycin   (ZITHROMAX ) 500 mg in sodium chloride  0.9 % 250 mL IVPB        500 mg 250 mL/hr over 60 Minutes Intravenous  Once 08/28/24 1852 08/28/24 2023   08/28/24 1300  cefTRIAXone  (ROCEPHIN ) 2 g in sodium chloride  0.9 % 100 mL IVPB        2 g 200 mL/hr over 30 Minutes Intravenous  Once 08/28/24 1254 08/28/24 1416       Assessment/Plan: Impression: 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  I have told the patient that there is nothing further that can be offered from a surgical standpoint.  He does not want the NG tube replaced.  He has family coming in today to discuss future management.  I told him to strongly consider palliative care nursing at home.  I told him that his cancer is advancing and any further treatment most likely will not change his course.  He is considering his options going forward.  LOS: 9 days    Oneil Budge 09/14/24  "

## 2024-09-11 NOTE — Death Summary Note (Signed)
 "  DEATH SUMMARY   Patient Details  Name: Marvin Carr MRN: [id number] DOB: 24-Dec-1955 ERE:Yjoo, Norleen PEDLAR, MD Admission/Discharge Information   Admit Date:  09/18/2024  Date of Death: Date of Death: 27-Sep-2024  Time of Death: Time of Death: 1629/10/07  Length of Stay: 9   Principle Cause of death: metastatic cholangiocarcinoma     Hospital Course: 69 y.o. 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 -He presented to ED secondary to dyspnea, lysed weakness, fatigue, he denies chest pain, reports orthopnea, cough, nonproductive, fever and chills at home, he has diarrhea which is chronic, forts weakness during ambulation yesterday where he felt his legs gave up or he had a fall, had oncology follow-up appointment today where he was noted to be febrile 100.3, tachypneic and tachycardic so he was brought to ED for further evaluation. -In ED workup significant for Lones AF pneumonia, CTA chest negative for PE but significant for multifocal pneumonia, labs were significant for Creatinine, elevated lactic acid at 4.1, improved with IV fluids, his respiratory panel positive for influenza A, negative UA, [facility] hospitalist consulted to admit. The patient was started on oseltamavir and bronchodilators.  He was felt to have a degree of fluid overload and started on IV furosemide  for period of 3 days.  The patient was started on ceftriaxone  and azithromycin  which she received for 5  days.  He improved clinically with resolution of his sepsis physiology. Unfortunately, patient began having increasing abdominal distention, abdominal pain, and vomiting.  CT abdomen and pelvis confirmed partial small bowel obstruction.  General surgery was consulted.  The patient was not felt to be a surgical candidate.  IR was consulted for consideration of palliative gastrostomy tube placement for venting.  After discussion with IR, they felt the patient was a poor candidate for any further intervention.  Palliative medicine was consulted.  Goals of care were discussed with the patient.  The patient continued to deteriorate with increasing abdominal pain, increasing abdominal distention, and vomiting.  The patient was transition to DNR/DNI. NG tube was attempted, numerous times without success. On morning 27-Sep-2024, further goals of care discussions were held with the patient.  The patient confirmed DNR/DNI.  He stated that he did not want any further invasive interventions or radiographic studies.  He did not want to be transferred to another hospital at this point.  He was agreeable to continued antibiotics, fluids, and other medications as indicated.  Later in the day, further GOC discussions were held with patient with aunt and cousin at bedside. I met the patient's aunt [name] Croak and cousin [name] around 1:55 PM for goals of care discussion at the bedside. I briefly summarized the patient's care during this hospitalization to his family. We discussed what Mr. Aceituno would want should his health continue to deteriorate.  It was clear that Mr. Smolinsky went to be free from any pain and suffering. We discussed that Mr. Statzer has  felt the good fight, and he states that he is now feeling tired and weak.  Mr. Wadsworth  feels that he is given all his will and offered infighting this cancer.   We discussed his overall poor prognosis due to the progression of his cancer, severity of his acute medical  illness, and his several comorbidities.  We discussed that he is no longer a candidate for any operative or invasive intervention, and that such intervention would only further create harm, pain, and suffering. We discussed that continue medical therapy at this point would only continue to prolong his pain and suffering and would not alter the natural outcome of his metastatic cancer.  We discussed at that even if he were stable enough to go home, he would have a repeat hospitalization. During the entire conversation, the patient was clearly more dyspneic and was complaining of nausea and wanted to throw up.   We discussed comfort care measures and palliation and what that would look like for the patient.  We discussed on burning him from his many medications as well as from further blood draws and diagnostic studies.  I told the patient that I would ensure that he would not struggle to breathe or suffer from any further pain.   The patient's aunt and niece stated that they did not want the patient to have any further suffering or pain.  Ultimately, the patient stated that he did not want any further blood draws, diagnostic studies, or any invasive procedures.  He wanted to focus more on comfort to relieve his pain and shortness of breath.  He agreed to transition to full comfort measures.  The patient's cousin and aunt also agreed with the transition to full comfort measures.  Subsequently, his medications with curative intent were discontinued.  The patient was started on Dilaudid  and Ativan .   Assessment and Plan: severe sepsis -- RESOLVED  - Fever, tachycardia, dyspnea, influenza A, hypotension given concern for severe sepsis.  Blood pressure responding well to IV fluid bolus.  No need for vasopressor medications at this time.  Blood cultures NGTD, - sepsis physiology resolved initially before recurrent SBO   Acute influenza A -- IMPROVING  - Rapid screen positive for influenza A.  -MRSA PCR  negative and minimal leukocytosis, discontinued vancomycin .  Strep pneumo negative.   -He is completing a 5 day course of tamiflu . -stable on RA before pt aspirated from vomiting from his SBO   Acute hypoxic respiratory failure  - Secondary to influenza A.   -Weaned to room air initially -12/27--desat to 88% RA>>2L -CT angio chest that was neg for PE.  CXR 12/22 suggesting mild pulmonary edema,  -initially started IV lasix >>hold now,  -added bronchodilators and added scheduled cough syrup and incentive spirometry, encouraged ambulation,  -he completed 5 days of ceftriaxone  and azithromycin .  - 12/27>>start zosyn  for aspiration pneumonia   Aspiration Pneumonia -12/27--started zosyn  prior to starting comfort measures   Partial SBO -12/23--pt had increase abd pain and distension -12/24 CT AP--Diffuse small bowel dilatation is stable to mildly progressive.  discrete transition zone is not identified but small bowel in the right lower quadrant is completely decompressed. Imaging features are compatible with small bowel obstruction -12/24--consulted general surgery -12/26--discussed with Dr. Mavis -12/26--discussed with IR--not a candidate for a venting G-tube -12/26--attempt NG placement x 2 unsuccessful -12/27--abd remains distended with continued n/v   Positive blood culture -- 1/4 blood culture reported positive for Cutibacterium -- he was treated with IV ceftriaxone  2 gm daily x 5 doses  - represents contaminant   Metastatic cholangiocarcinoma - Noted peritoneal carcinomatosis and metastatic disease  to the liver and bile duct.  Follows with oncology in the outpatient setting.  -last FOLFOX 07/29/24   CAD/HLD - S/p LCx stent 10/25.  Continue Brilinta .  Aspirin  discontinued during recent hospitalization per cardiology recommendations.   -no chest pain presently     Acute on chronic HFpEF/HTN -Appear to be exacerbated and responding to IV furosemide  40 mg daily>>now stopped  due to PSBO -- added daily weights, strict I/Os, fluid restriction -- potassium supplement ordered -hold further lasix  for now as he appears euvolemic now -- hold nifedipine >>BPs now soft   Diabetes mellitus, type 2  - Insulin  sliding scale ordered. -07/03/24 A1C--5.7   BPH - Flomax  .   Anxiety/depression - Resumed Wellbutrin  300 mg daily.   Transaminasemia -due to cholangiocarcinoma    The results of significant diagnostics from this hospitalization (including imaging, microbiology, ancillary and laboratory) are listed below for reference.   Significant Diagnostic Studies: DG Abd 1 View Result Date: 09/05/2024 EXAM: 1 VIEW XRAY OF THE ABDOMEN 09/05/2024 02:33:00 PM COMPARISON: 09/05/2024 CLINICAL HISTORY: Encounter for imaging study to confirm nasogastric (NG) tube placement. FINDINGS: LINES, TUBES AND DEVICES: Enter tube noted overlying the inferomediastinum with tube coursing caudally noted to make a hairpin turn at the GE junction and then coursing cranially along the mediastium with tip overlying the expected region of the mid to distal esophagus. Partially visualized central venous catheter in place with tip overlying the cavoatrial junction. BOWEL: Nonobstructive bowel gas pattern. SOFT TISSUES: No abnormal calcifications. BONES: No acute fracture. IMPRESSION: 1. Enter tube noted overlying the inferomediastinum with tube coursing caudally noted to make a hairpin turn at the GE junction and then coursing cranially along the mediastium with tip overlying the expected region of the mid to distal esophagus. Recommend repositioning or replacement. Electronically signed by: Morgane Naveau MD 09/05/2024 03:12 PM EST RP Workstation: [id number]   DG Abd 1 View Result Date: 09/05/2024 CLINICAL DATA:  Nasogastric tube placement EXAM: DG ABDOMEN 1V COMPARISON:  Three days ago FINDINGS: Nasogastric tube appears to be looped in distal esophagus and not seen to enter stomach. Repositioning is  recommended. IMPRESSION: Nasogastric tube appears to be looped in distal esophagus and not seen to enter stomach. Repositioning is recommended. Electronically Signed   By: Lynwood Landy Raddle M.D.   On: 09/05/2024 12:47   US  Paracentesis Result Date: 09/03/2024 INDICATION: Ascites EXAM: ULTRASOUND GUIDED therapeutic and diagnostic PARACENTESIS MEDICATIONS: None. COMPLICATIONS: None immediate. PROCEDURE: Informed written consent was obtained from the patient after a discussion of the risks, benefits and alternatives to treatment. A timeout was performed prior to the initiation of the procedure. Initial ultrasound scanning demonstrates a moderate amount of ascites within the right upper abdominal quadrant. The right lower abdomen was prepped and draped in the usual sterile fashion. 1% lidocaine  was used for local anesthesia. Following this, a Safe-T-Centesis catheter was introduced. An ultrasound image was saved for documentation purposes. The paracentesis was performed. The catheter was removed and a dressing was applied. The patient tolerated the procedure well without immediate post procedural complication. FINDINGS: A total of approximately 1.6 L of serous fluid was removed. Samples were sent to the laboratory as requested by the clinical team. IMPRESSION: Successful ultrasound-guided paracentesis yielding 1.6 liters of peritoneal fluid. Electronically Signed   By: Lynwood Landy Raddle M.D.   On: 09/03/2024 12:46   CT ABDOMEN PELVIS W CONTRAST Result Date: 09/03/2024 CLINICAL DATA:  Abdominal pain and distension. Bowel obstruction suspected. History of metastatic cholangiocarcinoma. * Tracking Code: BO *  EXAM: CT ABDOMEN AND PELVIS WITH CONTRAST TECHNIQUE: Multidetector CT imaging of the abdomen and pelvis was performed using the standard protocol following bolus administration of intravenous contrast. RADIATION DOSE REDUCTION: This exam was performed according to the departmental dose-optimization program which  includes automated exposure control, adjustment of the mA and/or kV according to patient size and/or use of iterative reconstruction technique. CONTRAST:  OMNIPAQUE  IOHEXOL  300 MG/ML  SOLN COMPARISON:  Abdomen and pelvis CTA 08/16/2024. FINDINGS: Lower chest: Moderate left pleural effusion with left base collapse/consolidation, similar to 08/16/2024. Hepatobiliary: 13 mm low-density lesion in the dome of the liver is stable in the interval. Additional low-density lesions in the left and right hepatic lobes measure up to 2.2 cm also stable since immediate prior study and remain compatible with metastatic disease. Gallbladder is distended with intraluminal debris and calcified stone. No intrahepatic or extrahepatic biliary dilation. Pancreas: No focal mass lesion. No dilatation of the main duct. No intraparenchymal cyst. No peripancreatic edema. Spleen: No splenomegaly. No suspicious focal mass lesion. Adrenals/Urinary Tract: No adrenal nodule or mass. Tiny well-defined homogeneous low-density lesions in both kidneys are too small to characterize but are statistically most likely benign and probably cysts. No followup imaging is recommended. No evidence for hydroureter. Bladder is obscured by beam hardening artifact from bilateral hip replacement. Stomach/Bowel: Stomach is moderately distended, similar to prior. Diffuse small bowel dilatation is stable to mildly progressive in the interval. Small bowel loops measure up to 5.3 cm diameter on the current study in the left upper quadrant. Small bowel pneumatosis seen previously has resolved. A discrete transition zone is not identified but small bowel in the right lower quadrant is completely decompressed. Colon is stool filled but nondilated. Vascular/Lymphatic: There is moderate atherosclerotic calcification of the abdominal aorta without aneurysm. There is no gastrohepatic or hepatoduodenal ligament lymphadenopathy. No retroperitoneal or mesenteric  lymphadenopathy. No pelvic sidewall lymphadenopathy. Reproductive: Prostate gland is obscured by beam hardening artifact. Other: Small to moderate volume ascites appears loculated in shows areas of apparent peritoneal enhancement, features compatible with carcinomatosis. There is nodularity in the omentum and mesentery suspicious for metastatic disease. Musculoskeletal: Bilateral hip replacement. No worrisome lytic or sclerotic osseous abnormality. IMPRESSION: 1. Diffuse small bowel dilatation is stable to mildly progressive in the interval. A discrete transition zone is not identified but small bowel in the right lower quadrant is completely decompressed. Imaging features are compatible with small bowel obstruction. 2. Small to moderate volume ascites appears loculated and shows areas of apparent peritoneal enhancement, features compatible with carcinomatosis. There is nodularity in the omentum and mesentery suspicious for metastatic disease. 3. Moderate left pleural effusion with left base collapse/consolidation, similar to 08/16/2024. 4. Stable low-density lesions in the liver compatible with metastatic disease. 5. Cholelithiasis. 6.  Aortic Atherosclerosis (ICD10-I70.0). Electronically Signed   By: Camellia Candle M.D.   On: 09/03/2024 10:50   DG Abd 1 View Result Date: 09/02/2024 CLINICAL DATA:  Abdominal discomfort.  Abdominal distension.  Pain. EXAM: ABDOMEN - 1 VIEW COMPARISON:  Radiograph 08/19/2024 FINDINGS: Dilated air-filled loops of bowel in the left mid abdomen, may represent redundant colon or small bowel. Moderate formed stool in the right and transverse colon. No evidence of free air on the supine views. Multiple pelvic phleboliths. IMPRESSION: Dilated air-filled loops of bowel in the left mid abdomen, may represent redundant colon or small bowel. Consider further evaluation with CT. Electronically Signed   By: Andrea Gasman M.D.   On: 09/02/2024 15:21   DG CHEST PORT 1  VIEW Result Date:  09/01/2024 CLINICAL DATA:  Dyspnea. Influenza A. Shortness of breath. Chest tightness. EXAM: PORTABLE CHEST 1 VIEW COMPARISON:  08/29/2019 radiograph and CT FINDINGS: Right chest port remains in place. Stable heart size and mediastinal contours. Diffuse interstitial coarsening, with slight progression from prior exam. Left pleural effusion and basilar opacity are similar. No pneumothorax. IMPRESSION: 1. Diffuse interstitial coarsening, with slight progression from prior exam, may represent atypical infection or pulmonary edema. 2. Left pleural effusion and basilar opacity are similar. Electronically Signed   By: Andrea Gasman M.D.   On: 09/01/2024 16:47   CT Angio Chest PE W and/or Wo Contrast Result Date: 08/28/2024 CLINICAL DATA:  Concern for pulmonary embolism. EXAM: CT ANGIOGRAPHY CHEST WITH CONTRAST TECHNIQUE: Multidetector CT imaging of the chest was performed using the standard protocol during bolus administration of intravenous contrast. Multiplanar CT image reconstructions and MIPs were obtained to evaluate the vascular anatomy. RADIATION DOSE REDUCTION: This exam was performed according to the departmental dose-optimization program which includes automated exposure control, adjustment of the mA and/or kV according to patient size and/or use of iterative reconstruction technique. CONTRAST:  75mL OMNIPAQUE  IOHEXOL  350 MG/ML SOLN COMPARISON:  Chest radiograph dated 08/28/2024. FINDINGS: Cardiovascular: There is no cardiomegaly or pericardial effusion. There is 3 vessel coronary vascular calcification. Mild atherosclerotic calcification of the thoracic aorta. No aneurysmal dilatation or dissection. The origins of the great vessels of the aortic arch appear patent. Right-sided Port-A-Cath with tip over central SVC. No pulmonary artery embolus identified. Mediastinum/Nodes: No hilar or mediastinal adenopathy. The esophagus is grossly unremarkable no mediastinal fluid collection. Lungs/Pleura: Small left  pleural effusion with partial compressive atelectasis of the left lower lobe. Patchy area of streaky and nodular density in the left upper lobe and lingula consistent with pneumonia. Aspiration is not excluded. Additional reticulonodular density at the right lung base may represent atelectasis or infiltrate. No pneumothorax. The central airways are patent. Upper Abdomen: Partially visualized ascites Musculoskeletal: Degenerative changes of the spine. No acute osseous pathology. Review of the MIP images confirms the above findings. IMPRESSION: 1. No CT evidence of pulmonary artery embolus. 2. Left upper lobe and lingular pneumonia. Aspiration is not excluded. 3. Small left pleural effusion with partial compressive atelectasis of the left lower lobe. 4. Partially visualized ascites. 5.  Aortic Atherosclerosis (ICD10-I70.0). Electronically Signed   By: Vanetta Chou M.D.   On: 08/28/2024 18:17   DG Chest Port 1 View Result Date: 08/28/2024 CLINICAL DATA:  Questionable sepsis - evaluate for abnormality EXAM: PORTABLE CHEST - 1 VIEW COMPARISON:  07/16/2024 FINDINGS: Right chest port is similarly positioned terminating in the lower SVC. Unchanged trace left pleural effusion. Streaky left basilar atelectasis. No pneumothorax or new airspace consolidation. The cardiac silhouette is at the upper limits of normal, likely accentuated by AP technique. Aortic atherosclerosis. No acute fracture or destructive lesions. Multilevel thoracic osteophytosis. IMPRESSION: Unchanged trace left pleural effusion with streaky left basilar atelectasis. Electronically Signed   By: Rogelia Myers M.D.   On: 08/28/2024 13:35   DG Abd Portable 1V Result Date: 08/19/2024 CLINICAL DATA:  Bowel obstruction. EXAM: PORTABLE ABDOMEN - 1 VIEW COMPARISON:  08/18/2024 FINDINGS: NG tube tip is in the proximal stomach. Although side port of the NG tube is not clearly visualized, it is likely in the distal esophagus. Gaseous distention of  transverse colon noted without frank dilatation. Gas is visible in the nondistended rectum. Air is seen scattered through nondilated small bowel loops. IMPRESSION: 1. NG tube tip is in the  proximal stomach. Although side port of the NG tube is not clearly visualized, it is likely in the distal esophagus. 2. Nonspecific bowel gas pattern. Electronically Signed   By: Camellia Candle M.D.   On: 08/19/2024 07:21   DG Abd 1 View Result Date: 08/18/2024 CLINICAL DATA:  Enteric catheter placement EXAM: ABDOMEN - 1 VIEW COMPARISON:  08/17/2024 FINDINGS: Frontal view of the lower chest and upper abdomen demonstrates stable position of the enteric catheter, tip projecting over the gastric fundus. Side port projects proximally 2.5 cm above the left hemidiaphragm. Bowel gas pattern is unremarkable. Stable left pleural effusion and left lower lobe consolidation. IMPRESSION: 1. Enteric catheter tip projecting over the gastric fundus, with side port projecting 2.5 cm above the left hemidiaphragm. 2. Stable left pleural effusion and left basilar consolidation. Electronically Signed   By: Ozell Daring M.D.   On: 08/18/2024 17:10   DG Abd 1 View Result Date: 08/17/2024 EXAM: 1 VIEW XRAY OF THE ABDOMEN 08/17/2024 12:28:00 AM COMPARISON: None available. CLINICAL HISTORY: Encounter for nasogastric (NG) tube placement. FINDINGS: LINES, TUBES AND DEVICES: Enteric tube courses below the hemidiaphragm with the tip overlying the gastric lumen and side port not well visualized, likely in the region of the gastroesophageal junction. BOWEL: Nonobstructive bowel gas pattern. SOFT TISSUES: No abnormal calcifications. BONES: No acute fracture. LIMITATIONS: Right flank and pelvis collimated off view. IMPRESSION: 1. Enteric tube tip overlies the gastric lumen; side port is not well visualized and is likely at the gastroesophageal junction, consider advancing the tube slightly to ensure the side port is intragastric. Electronically signed by:  Morgane Naveau MD 08/17/2024 12:33 AM EST RP Workstation: [id number]   CT ANGIO GI BLEED Result Date: 08/16/2024 EXAM: CTA ABDOMEN AND PELVIS WITH CONTRAST 08/16/2024 03:23:46 PM TECHNIQUE: CTA images of the abdomen and pelvis with intravenous contrast. 100 mL iohexol  (OMNIPAQUE ) 350 MG/ML injection. Three-dimensional MIP/volume rendered formations were performed. Automated exposure control, iterative reconstruction, and/or weight based adjustment of the mA/kV was utilized to reduce the radiation dose to as low as reasonably achievable. COMPARISON: 05/15/2024 and 06/03/2024. CLINICAL HISTORY: Suspected GI bleed. FINDINGS: VASCULATURE: GI BLEED: No active extravasation of contrast within the GI tract. AORTA: No acute finding. No abdominal aortic aneurysm. No dissection. CELIAC TRUNK: No acute finding. No occlusion or significant stenosis. SUPERIOR MESENTERIC ARTERY: No acute finding. No occlusion or significant stenosis. INFERIOR MESENTERIC ARTERY: No acute finding. No occlusion or significant stenosis. RENAL ARTERIES: No acute finding. No occlusion or significant stenosis. ILIAC ARTERIES: No acute finding. No occlusion or significant stenosis. ABDOMEN/PELVIS: LOWER CHEST: Visualized portion of the lower chest demonstrates no acute abnormality. LIVER: Hepatic low density is again noted consistent with metastatic disease. GALLBLADDER AND BILE DUCTS: Cholelithiasis. No biliary ductal dilatation. SPLEEN: The spleen is unremarkable. PANCREAS: The pancreas is unremarkable. ADRENAL GLANDS: Bilateral adrenal glands demonstrate no acute abnormality. KIDNEYS, URETERS AND BLADDER: No stones in the kidneys or ureters. No hydronephrosis. No perinephric or periureteral stranding. Urinary bladder is unremarkable. GI AND BOWEL: Severely dilated small bowel loops are noted with intramural gas in multiple small bowel loops consistent with ischemic bowel disease. No colonic dilatation is noted. Stomach and duodenal sweep  demonstrate no acute abnormality. REPRODUCTIVE: Reproductive organs are unremarkable. PERITONEUM AND RETROPERITONEUM: Mild ascites is noted. 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 No free air. LYMPH NODES: No lymphadenopathy. BONES AND SOFT TISSUES: Status post bilateral hip arthroplasties. No acute abnormality of the bones. No acute soft tissue abnormality. IMPRESSION: 1. Severely dilated small bowel  loops with intramural gas, consistent with ischemic bowel disease. This finding was discussed with Dr. Melvenia at 3:38 pm on 08/16/2024. 2. No active gastrointestinal bleeding identified. 3. Hepatic low-density lesions consistent with metastatic disease. 4. 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 Electronically signed by: Lynwood Seip MD 08/16/2024 03:40 PM EST RP Workstation: [id number]   US  ASCITES (ABDOMEN LIMITED) Result Date: 08/15/2024 CLINICAL DATA:  Patient with history of cholangiocarcinoma with malignant ascites, recently started on diuretic medication. Request for possible therapeutic paracentesis. EXAM: LIMITED ABDOMEN ULTRASOUND FOR ASCITES TECHNIQUE: Limited ultrasound survey for ascites was performed in all four abdominal quadrants. COMPARISON:  None Available. FINDINGS: Limited abdominal ultrasound of all four abdominal quadrants shows very small midline right upper quadrant pocket of ascites. No other areas of peritoneal fluid seen. IMPRESSION: Very small midline right upper quadrant pocket of ascites, not amenable to safe paracentesis. No procedure performed. Electronically Signed   By: Wilkie Lent M.D.   On: 08/15/2024 14:08    Microbiology: Recent Results (from the past 240 hours)  Resp panel by RT-PCR (RSV, Flu A&B, Covid) Anterior Nasal Swab     Status: Abnormal   Collection Time: 08/28/24  1:21 PM   Specimen: Anterior Nasal Swab  Result Value Ref Range Status   SARS Coronavirus 2 by RT PCR NEGATIVE NEGATIVE Final     Comment: (NOTE) SARS-CoV-2 target nucleic acids are NOT DETECTED.  The SARS-CoV-2 RNA is generally detectable in upper respiratory specimens during the acute phase of infection. The lowest concentration of SARS-CoV-2 viral copies this assay can detect is 138 copies/mL. A negative result does not preclude SARS-Cov-2 infection and should not be used as the sole basis for treatment or other patient management decisions. A negative result may occur with  improper specimen collection/handling, submission of specimen other than nasopharyngeal swab, presence of viral mutation(s) within the areas targeted by this assay, and inadequate number of viral copies(<138 copies/mL). A negative result must be combined with clinical observations, patient history, and epidemiological information. The expected result is Negative.  Fact Sheet for Patients:  bloggercourse.com  Fact Sheet for Healthcare Providers:  seriousbroker.it  This test is no t yet approved or cleared by the [place]  FDA and  has been authorized for detection and/or diagnosis of SARS-CoV-2 by FDA under an Emergency Use Authorization (EUA). This EUA will remain  in effect (meaning this test can be used) for the duration of the COVID-19 declaration under Section 564(b)(1) of the Act, 21 U.S.C.section 360bbb-3(b)(1), unless the authorization is terminated  or revoked sooner.       Influenza A by PCR POSITIVE (A) NEGATIVE Final   Influenza B by PCR NEGATIVE NEGATIVE Final    Comment: (NOTE) The Xpert Xpress SARS-CoV-2/FLU/RSV plus assay is intended as an aid in the diagnosis of influenza from Nasopharyngeal swab specimens and should not be used as a sole basis for treatment. Nasal washings and aspirates are unacceptable for Xpert Xpress SARS-CoV-2/FLU/RSV testing.  Fact Sheet for Patients: bloggercourse.com  Fact Sheet for Healthcare  Providers: seriousbroker.it  This test is not yet approved or cleared by the [place]  FDA and has been authorized for detection and/or diagnosis of SARS-CoV-2 by FDA under an Emergency Use Authorization (EUA). This EUA will remain in effect (meaning this test can be used) for the duration of the COVID-19 declaration under Section 564(b)(1) of the Act, 21 U.S.C. section 360bbb-3(b)(1), unless the authorization is terminated or revoked.     Resp Syncytial Virus by PCR NEGATIVE NEGATIVE Final  Comment: (NOTE) Fact Sheet for Patients: bloggercourse.com  Fact Sheet for Healthcare Providers: seriousbroker.it  This test is not yet approved or cleared by the [place]  FDA and has been authorized for detection and/or diagnosis of SARS-CoV-2 by FDA under an Emergency Use Authorization (EUA). This EUA will remain in effect (meaning this test can be used) for the duration of the COVID-19 declaration under Section 564(b)(1) of the Act, 21 U.S.C. section 360bbb-3(b)(1), unless the authorization is terminated or revoked.  Performed at [facility], [address], [place], [place] [address]   C Difficile Quick Screen w PCR reflex     Status: None   Collection Time: 08/28/24  1:22 PM   Specimen: STOOL  Result Value Ref Range Status   C Diff antigen NEGATIVE NEGATIVE Final   C Diff toxin NEGATIVE NEGATIVE Final   C Diff interpretation No C. difficile detected.  Final    Comment: Performed at [facility], [address], [place], [place] [address]  Culture, blood (Routine x 2)     Status: Abnormal   Collection Time: 08/28/24  1:34 PM   Specimen: BLOOD RIGHT ARM  Result Value Ref Range Status   Specimen Description   Final    BLOOD RIGHT ARM Performed at [facility], [address], [place], [place] [address]    Special Requests   Final    BOTTLES DRAWN AEROBIC AND ANAEROBIC Blood Culture adequate  volume Performed at [facility], [address] [place], [place] [address]    Culture  Setup Time   Final    GRAM POSITIVE RODS ANAEROBIC BOTTLE ONLY Gram Stain Report Called to,Read Back By and Verified With: B BROOKS,RN@0300  09/02/24 MK GS REVA GPR BY DD 09/02/2024 @ 0502 Performed at [facility], [address], [place], [place] [address]    Culture CUTIBACTERIUM ACNES (A)  Final   Report Status 09/04/2024 FINAL  Final  Culture, blood (Routine x 2)     Status: None   Collection Time: 08/28/24  1:39 PM   Specimen: BLOOD  Result Value Ref Range Status   Specimen Description BLOOD BLOOD RIGHT ARM  Final   Special Requests   Final    BOTTLES DRAWN AEROBIC AND ANAEROBIC Blood Culture adequate volume   Culture   Final    NO GROWTH 5 DAYS Performed at [facility], [address], [place], [place] [address]    Report Status 09/02/2024 FINAL  Final  MRSA Next Gen by PCR, Nasal     Status: None   Collection Time: 08/28/24 10:28 PM  Result Value Ref Range Status   MRSA by PCR Next Gen NOT DETECTED NOT DETECTED Final    Comment: (NOTE) The GeneXpert MRSA Assay (FDA approved for NASAL specimens only), is one component of a comprehensive MRSA colonization surveillance program. It is not intended to diagnose MRSA infection nor to guide or monitor treatment for MRSA infections. Test performance is not FDA approved in patients less than 35 years old. Performed at [facility], [address], [place], [place] [address]   Gram stain     Status: None   Collection Time: 09/03/24 11:40 AM   Specimen: Ascitic  Result Value Ref Range Status   Specimen Description ASCITIC  Final   Special Requests ASCITIC  Final   Gram Stain   Final    NO WBC SEEN NO ORGANISMS SEEN CYTOSPIN SMEAR Performed at [facility], [address] [place], [place] [address]    Report  Status 09/03/2024 FINAL  Final  Culture, body fluid w Gram Stain-bottle     Status: None (Preliminary result)   Collection  Time: 09/03/24 11:40 AM   Specimen: Ascitic  Result Value Ref Range Status   Specimen Description ASCITIC  Final   Special Requests   Final    BOTTLES DRAWN AEROBIC AND ANAEROBIC Blood Culture adequate volume   Culture   Final    NO GROWTH 2 DAYS Performed at [facility], [address] [place], [place] [address]    Report Status PENDING  Incomplete     Signed: Alm Schneider, MD 09-07-24   "

## 2024-09-11 NOTE — Plan of Care (Signed)

## 2024-09-11 NOTE — Progress Notes (Signed)
 Provider Erminio Cone NP notified of patients increased heartrate, and emesis.

## 2024-09-11 NOTE — Progress Notes (Signed)
 Asked for provider to come and see patient.

## 2024-09-11 DEATH — deceased

## 2024-09-22 ENCOUNTER — Inpatient Hospital Stay

## 2024-09-25 ENCOUNTER — Ambulatory Visit: Admitting: Cardiology

## 2025-01-29 ENCOUNTER — Ambulatory Visit: Admitting: Family Medicine
